# Patient Record
Sex: Male | Born: 1955 | Race: Black or African American | Hispanic: No | Marital: Single | Smoking: Former smoker
Health system: Southern US, Community
[De-identification: ages and names within clinical notes are randomized; demographics above are authoritative.]

## PROBLEM LIST (undated history)

## (undated) DIAGNOSIS — K4090 Unilateral inguinal hernia, without obstruction or gangrene, not specified as recurrent: Secondary | ICD-10-CM

## (undated) DIAGNOSIS — Z95810 Presence of automatic (implantable) cardiac defibrillator: Secondary | ICD-10-CM

## (undated) DIAGNOSIS — I2510 Atherosclerotic heart disease of native coronary artery without angina pectoris: Secondary | ICD-10-CM

## (undated) DIAGNOSIS — J189 Pneumonia, unspecified organism: Secondary | ICD-10-CM

## (undated) DIAGNOSIS — C801 Malignant (primary) neoplasm, unspecified: Secondary | ICD-10-CM

## (undated) DIAGNOSIS — R7303 Prediabetes: Secondary | ICD-10-CM

## (undated) DIAGNOSIS — I2109 ST elevation (STEMI) myocardial infarction involving other coronary artery of anterior wall: Secondary | ICD-10-CM

## (undated) HISTORY — DX: ST elevation (STEMI) myocardial infarction involving other coronary artery of anterior wall: I21.09

## (undated) HISTORY — DX: Atherosclerotic heart disease of native coronary artery without angina pectoris: I25.10

## (undated) HISTORY — DX: Unilateral inguinal hernia, without obstruction or gangrene, not specified as recurrent: K40.90

## (undated) HISTORY — PX: COLONOSCOPY: SHX174

---

## 1999-03-08 ENCOUNTER — Emergency Department (HOSPITAL_COMMUNITY): Admission: EM | Admit: 1999-03-08 | Discharge: 1999-03-08 | Payer: Self-pay

## 2000-11-21 ENCOUNTER — Inpatient Hospital Stay (HOSPITAL_COMMUNITY): Admission: EM | Admit: 2000-11-21 | Discharge: 2000-11-26 | Payer: Self-pay | Admitting: Emergency Medicine

## 2000-11-21 ENCOUNTER — Encounter: Payer: Self-pay | Admitting: Emergency Medicine

## 2000-11-22 ENCOUNTER — Encounter: Payer: Self-pay | Admitting: Emergency Medicine

## 2000-11-23 ENCOUNTER — Encounter: Payer: Self-pay | Admitting: Internal Medicine

## 2000-12-19 ENCOUNTER — Encounter: Payer: Self-pay | Admitting: Family Medicine

## 2000-12-19 ENCOUNTER — Ambulatory Visit (HOSPITAL_COMMUNITY): Admission: RE | Admit: 2000-12-19 | Discharge: 2000-12-19 | Payer: Self-pay | Admitting: Family Medicine

## 2005-11-17 ENCOUNTER — Ambulatory Visit (HOSPITAL_COMMUNITY): Admission: RE | Admit: 2005-11-17 | Discharge: 2005-11-17 | Payer: Self-pay | Admitting: Gastroenterology

## 2010-06-26 DIAGNOSIS — I509 Heart failure, unspecified: Secondary | ICD-10-CM

## 2010-06-26 HISTORY — DX: Heart failure, unspecified: I50.9

## 2010-07-12 ENCOUNTER — Inpatient Hospital Stay (HOSPITAL_COMMUNITY)
Admission: AD | Admit: 2010-07-12 | Discharge: 2010-07-19 | Payer: Self-pay | Source: Home / Self Care | Attending: Cardiovascular Disease | Admitting: Cardiovascular Disease

## 2010-07-12 HISTORY — PX: CARDIAC CATHETERIZATION: SHX172

## 2010-07-13 ENCOUNTER — Encounter (INDEPENDENT_AMBULATORY_CARE_PROVIDER_SITE_OTHER): Payer: Self-pay | Admitting: Cardiovascular Disease

## 2010-07-13 LAB — CBC
HCT: 43 % (ref 39.0–52.0)
Hemoglobin: 15.4 g/dL (ref 13.0–17.0)
MCH: 31.8 pg (ref 26.0–34.0)
MCHC: 35.8 g/dL (ref 30.0–36.0)
MCV: 88.8 fL (ref 78.0–100.0)
Platelets: 193 10*3/uL (ref 150–400)
RBC: 4.84 MIL/uL (ref 4.22–5.81)
RDW: 12.8 % (ref 11.5–15.5)
WBC: 9.5 10*3/uL (ref 4.0–10.5)

## 2010-07-13 LAB — MAGNESIUM: Magnesium: 1.9 mg/dL (ref 1.5–2.5)

## 2010-07-13 LAB — COMPREHENSIVE METABOLIC PANEL
ALT: 79 U/L — ABNORMAL HIGH (ref 0–53)
AST: 500 U/L — ABNORMAL HIGH (ref 0–37)
Albumin: 3.6 g/dL (ref 3.5–5.2)
Alkaline Phosphatase: 84 U/L (ref 39–117)
BUN: 5 mg/dL — ABNORMAL LOW (ref 6–23)
CO2: 22 mEq/L (ref 19–32)
Calcium: 8.3 mg/dL — ABNORMAL LOW (ref 8.4–10.5)
Chloride: 106 mEq/L (ref 96–112)
Creatinine, Ser: 0.87 mg/dL (ref 0.4–1.5)
GFR calc Af Amer: >60 mL/min (ref >60)
GFR calc non Af Amer: >60 mL/min (ref >60)
Glucose, Bld: 126 mg/dL — ABNORMAL HIGH (ref 70–99)
Potassium: 4 mEq/L (ref 3.5–5.1)
Sodium: 134 mEq/L — ABNORMAL LOW (ref 135–145)
Total Bilirubin: 0.5 mg/dL (ref 0.3–1.2)
Total Protein: 6.3 g/dL (ref 6.0–8.3)

## 2010-07-13 LAB — DIFFERENTIAL
Basophils Absolute: 0 10*3/uL (ref 0.0–0.1)
Basophils Relative: 0 % (ref 0–1)
Eosinophils Absolute: 0 10*3/uL (ref 0.0–0.7)
Eosinophils Relative: 0 % (ref 0–5)
Lymphocytes Relative: 13 % (ref 12–46)
Lymphs Abs: 1.2 10*3/uL (ref 0.7–4.0)
Monocytes Absolute: 0.7 10*3/uL (ref 0.1–1.0)
Monocytes Relative: 7 % (ref 3–12)
Neutro Abs: 7.5 10*3/uL (ref 1.7–7.7)
Neutrophils Relative %: 80 % — ABNORMAL HIGH (ref 43–77)

## 2010-07-13 LAB — POCT I-STAT, CHEM 8
BUN: 6 mg/dL (ref 6–23)
Calcium, Ion: 1.1 mmol/L — ABNORMAL LOW (ref 1.12–1.32)
Chloride: 106 mEq/L (ref 96–112)
Creatinine, Ser: 0.9 mg/dL (ref 0.4–1.5)
Glucose, Bld: 154 mg/dL — ABNORMAL HIGH (ref 70–99)
HCT: 48 % (ref 39.0–52.0)
Hemoglobin: 16.3 g/dL (ref 13.0–17.0)
Potassium: 3.6 mEq/L (ref 3.5–5.1)
Sodium: 140 mEq/L (ref 135–145)
TCO2: 22 mmol/L (ref 0–100)

## 2010-07-13 LAB — CARDIAC PANEL(CRET KIN+CKTOT+MB+TROPI)
CK, MB: 626.1 ng/mL (ref 0.3–4.0)
Relative Index: 8.3 — ABNORMAL HIGH (ref 0.0–2.5)
Total CK: 7523 U/L — ABNORMAL HIGH (ref 7–232)
Troponin I: >100 ng/mL (ref 0.00–0.06)

## 2010-07-13 LAB — APTT: aPTT: 74 seconds — ABNORMAL HIGH (ref 24–37)

## 2010-07-13 LAB — PROTIME-INR
INR: 2.12 — ABNORMAL HIGH (ref 0.00–1.49)
Prothrombin Time: 23.9 seconds — ABNORMAL HIGH (ref 11.6–15.2)

## 2010-07-13 LAB — MRSA PCR SCREENING: MRSA by PCR: NEGATIVE

## 2010-07-13 LAB — TSH: TSH: 1.553 u[IU]/mL (ref 0.350–4.500)

## 2010-07-14 HISTORY — PX: PACEMAKER PLACEMENT: SHX43

## 2010-07-18 LAB — URINALYSIS, MICROSCOPIC ONLY
Bilirubin Urine: NEGATIVE
Hgb urine dipstick: NEGATIVE
Ketones, ur: NEGATIVE mg/dL
Leukocytes, UA: NEGATIVE
Nitrite: NEGATIVE
Protein, ur: 30 mg/dL — AB
Specific Gravity, Urine: >1.046 — ABNORMAL HIGH (ref 1.005–1.030)
Urine Glucose, Fasting: NEGATIVE mg/dL
Urobilinogen, UA: 0.2 mg/dL (ref 0.0–1.0)
pH: 6 (ref 5.0–8.0)

## 2010-07-18 LAB — COMPREHENSIVE METABOLIC PANEL
ALT: 78 U/L — ABNORMAL HIGH (ref 0–53)
AST: 305 U/L — ABNORMAL HIGH (ref 0–37)
Albumin: 3.4 g/dL — ABNORMAL LOW (ref 3.5–5.2)
Chloride: 107 mEq/L (ref 96–112)
Creatinine, Ser: 1.62 mg/dL — ABNORMAL HIGH (ref 0.4–1.5)
GFR calc Af Amer: 54 mL/min — ABNORMAL LOW (ref >60)
Sodium: 140 mEq/L (ref 135–145)
Total Bilirubin: 1 mg/dL (ref 0.3–1.2)

## 2010-07-18 LAB — CBC
HCT: 41 % (ref 39.0–52.0)
Hemoglobin: 14.2 g/dL (ref 13.0–17.0)
Hemoglobin: 15.9 g/dL (ref 13.0–17.0)
Hemoglobin: 14.9 g/dL (ref 13.0–17.0)
MCH: 30.8 pg (ref 26.0–34.0)
MCH: 31.6 pg (ref 26.0–34.0)
MCHC: 34.6 g/dL (ref 30.0–36.0)
MCV: 88.9 fL (ref 78.0–100.0)
Platelets: 171 10*3/uL (ref 150–400)
RBC: 4.61 MIL/uL (ref 4.22–5.81)
RBC: 5.03 MIL/uL (ref 4.22–5.81)
RBC: 4.73 MIL/uL (ref 4.22–5.81)
RDW: 12.9 % (ref 11.5–15.5)
WBC: 10.9 10*3/uL — ABNORMAL HIGH (ref 4.0–10.5)

## 2010-07-18 LAB — CARDIAC PANEL(CRET KIN+CKTOT+MB+TROPI)
CK, MB: 760 ng/mL (ref 0.3–4.0)
CK, MB: 323.1 ng/mL (ref 0.3–4.0)
CK, MB: 41.6 ng/mL (ref 0.3–4.0)
Relative Index: 9.5 — ABNORMAL HIGH (ref 0.0–2.5)
Relative Index: 6.8 — ABNORMAL HIGH (ref 0.0–2.5)
Relative Index: 2.4 (ref 0.0–2.5)
Total CK: 8012 U/L — ABNORMAL HIGH (ref 7–232)
Total CK: 4729 U/L — ABNORMAL HIGH (ref 7–232)
Troponin I: >100 ng/mL (ref 0.00–0.06)
Troponin I: >100 ng/mL (ref 0.00–0.06)

## 2010-07-18 LAB — BASIC METABOLIC PANEL
BUN: 8 mg/dL (ref 6–23)
CO2: 23 mEq/L (ref 19–32)
Calcium: 8.7 mg/dL (ref 8.4–10.5)
Chloride: 104 mEq/L (ref 96–112)
Creatinine, Ser: 1.14 mg/dL (ref 0.4–1.5)
GFR calc Af Amer: >60 mL/min (ref >60)
GFR calc non Af Amer: >60 mL/min (ref >60)
Glucose, Bld: 149 mg/dL — ABNORMAL HIGH (ref 70–99)
Potassium: 4.1 mEq/L (ref 3.5–5.1)
Sodium: 137 mEq/L (ref 135–145)

## 2010-07-18 LAB — HEPARIN LEVEL (UNFRACTIONATED)
Heparin Unfractionated: 0.12 IU/mL — ABNORMAL LOW (ref 0.30–0.70)
Heparin Unfractionated: 0.49 IU/mL (ref 0.30–0.70)
Heparin Unfractionated: 0.66 IU/mL (ref 0.30–0.70)
Heparin Unfractionated: 0.18 IU/mL — ABNORMAL LOW (ref 0.30–0.70)
Heparin Unfractionated: 0.35 IU/mL (ref 0.30–0.70)

## 2010-07-18 LAB — POCT I-STAT 3, ART BLOOD GAS (G3+)
Acid-base deficit: 5 mmol/L — ABNORMAL HIGH (ref 0.0–2.0)
Bicarbonate: 16.9 mEq/L — ABNORMAL LOW (ref 20.0–24.0)
O2 Saturation: 89 %
Patient temperature: 98.6
TCO2: 18 mmol/L (ref 0–100)
pCO2 arterial: 24.8 mmHg — ABNORMAL LOW (ref 35.0–45.0)
pH, Arterial: 7.442 (ref 7.350–7.450)
pO2, Arterial: 53 mmHg — ABNORMAL LOW (ref 80.0–100.0)

## 2010-07-18 LAB — BRAIN NATRIURETIC PEPTIDE
Pro B Natriuretic peptide (BNP): 317 pg/mL — ABNORMAL HIGH (ref 0.0–100.0)
Pro B Natriuretic peptide (BNP): 901 pg/mL — ABNORMAL HIGH (ref 0.0–100.0)

## 2010-07-18 LAB — HEMOGLOBIN A1C
Hgb A1c MFr Bld: 5.7 % — ABNORMAL HIGH (ref <5.7)
Hgb A1c MFr Bld: 5.7 % — ABNORMAL HIGH (ref <5.7)
Mean Plasma Glucose: 117 mg/dL — ABNORMAL HIGH (ref <117)
Mean Plasma Glucose: 117 mg/dL — ABNORMAL HIGH (ref <117)

## 2010-07-18 LAB — LIPID PANEL
Cholesterol: 133 mg/dL (ref 0–200)
HDL: 39 mg/dL — ABNORMAL LOW (ref >39)
LDL Cholesterol: 80 mg/dL (ref 0–99)
Total CHOL/HDL Ratio: 3.4 RATIO
Triglycerides: 70 mg/dL (ref <150)
VLDL: 14 mg/dL (ref 0–40)

## 2010-07-19 LAB — BASIC METABOLIC PANEL
CO2: 21 mEq/L (ref 19–32)
CO2: 22 mEq/L (ref 19–32)
Calcium: 8.2 mg/dL — ABNORMAL LOW (ref 8.4–10.5)
Calcium: 8.2 mg/dL — ABNORMAL LOW (ref 8.4–10.5)
Chloride: 108 mEq/L (ref 96–112)
GFR calc Af Amer: >60 mL/min (ref >60)
GFR calc Af Amer: >60 mL/min (ref >60)
GFR calc non Af Amer: >60 mL/min (ref >60)
Sodium: 137 mEq/L (ref 135–145)
Sodium: 136 mEq/L (ref 135–145)

## 2010-07-19 LAB — CBC
Hemoglobin: 13.9 g/dL (ref 13.0–17.0)
Hemoglobin: 13.5 g/dL (ref 13.0–17.0)
MCH: 31.5 pg (ref 26.0–34.0)
MCHC: 35.1 g/dL (ref 30.0–36.0)
Platelets: 189 10*3/uL (ref 150–400)
Platelets: 209 10*3/uL (ref 150–400)
RBC: 4.45 MIL/uL (ref 4.22–5.81)
RBC: 4.51 MIL/uL (ref 4.22–5.81)
WBC: 13.3 10*3/uL — ABNORMAL HIGH (ref 4.0–10.5)

## 2010-07-19 LAB — HEPARIN LEVEL (UNFRACTIONATED)
Heparin Unfractionated: 0.23 IU/mL — ABNORMAL LOW (ref 0.30–0.70)
Heparin Unfractionated: 0.56 IU/mL (ref 0.30–0.70)

## 2010-07-19 LAB — BRAIN NATRIURETIC PEPTIDE: Pro B Natriuretic peptide (BNP): 253 pg/mL — ABNORMAL HIGH (ref 0.0–100.0)

## 2010-07-20 LAB — COMPREHENSIVE METABOLIC PANEL
ALT: 47 U/L (ref 0–53)
AST: 45 U/L — ABNORMAL HIGH (ref 0–37)
Albumin: 2.7 g/dL — ABNORMAL LOW (ref 3.5–5.2)
Alkaline Phosphatase: 67 U/L (ref 39–117)
Chloride: 107 mEq/L (ref 96–112)
GFR calc Af Amer: >60 mL/min (ref >60)
Potassium: 3.8 mEq/L (ref 3.5–5.1)
Sodium: 136 mEq/L (ref 135–145)
Total Bilirubin: 0.7 mg/dL (ref 0.3–1.2)

## 2010-07-20 LAB — CBC
Hemoglobin: 13.3 g/dL (ref 13.0–17.0)
MCH: 31 pg (ref 26.0–34.0)
RBC: 4.29 MIL/uL (ref 4.22–5.81)
WBC: 9.6 10*3/uL (ref 4.0–10.5)

## 2010-07-20 LAB — HEPARIN LEVEL (UNFRACTIONATED): Heparin Unfractionated: 0.63 IU/mL (ref 0.30–0.70)

## 2010-07-21 NOTE — Procedures (Addendum)
  NAMEKOLSEN, CHOE NO.:  [id number]  MEDICAL RECORD NO.:  [id number]          PATIENT TYPE:  INP  LOCATION:  2902                         FACILITY:  [facility]  PHYSICIAN:  Thereasa Solo. Little, M.D. DATE OF BIRTH:  02/04/1957  DATE OF PROCEDURE:  07/14/2010 DATE OF DISCHARGE:                           CARDIAC CATHETERIZATION   INDICATIONS FOR TEST:  This 55 year old male was admitted on July 12, 2010, having a massive anterior myocardial infarction with troponins greater than 100 upon arrival and they have maintained themselves greater than 100 now 48 hours later.  Late yesterday afternoon, he began having some intermittent second-degree block.  This has progressed to intermittent second and third-degree AV block and long pauses with nonconducting P-waves.  Because of this, he is brought to the Cath Lab for temporary pacemaker implantation.  Following informed consent, the patient was prepped and draped in the usual sterile fashion exposing the right groin. Following local anesthetic with 1% Xylocaine, the Seldinger technique employed.  A 6- French introducer sheath was placed in the right femoral vein.  A 5- [place] temporary pacemaker was then placed through the sheath into the apex of the right ventricle.  This pacemaker wire was defective and we could not get it to even transmit any evidence of pacer spike.  Because of this, after changing the box and changing the external wires, the pacer wire was exchanged out for a second 5-French pacer wire.  This also was placed in the apex of the right ventricle, showed appropriate function and there was good capture of the rhythm all way down to MA of 0.6.  This pacemaker settings are currently MA of 5 and rate of 60.  This device was sewn in place and the patient will be watched for the next 48 hours, but if this rhythm issue continues he will need a permanent device.  His ejection fraction was 35-40% at the time  of his acute event.          ______________________________ Thereasa Solo. Little, M.D.     ABL/MEDQ  D:  07/14/2010  T:  07/14/2010  Job:  [id number]  cc:   Nicki Guadalajara, M.D.  Electronically Signed by Julieanne Manson M.D. on 07/21/2010 04:01:34 PM

## 2010-07-27 NOTE — Procedures (Signed)
Steven [name], Steven [name] NO.:  [id number]  MEDICAL RECORD NO.:  [id number]          PATIENT TYPE:  INP  LOCATION:  2902                         FACILITY:  [facility]  PHYSICIAN:  Nicki Guadalajara, M.D.     DATE OF BIRTH:  02/04/1957  DATE OF PROCEDURE:  07/12/2010 DATE OF DISCHARGE:                           CARDIAC CATHETERIZATION   PROCEDURE:  Emergent cardiac catheterization and percutaneous coronary intervention.  INDICATIONS:  [name] is a 55 year old African American gentleman originally from [place].  He denies any prior known cardiac history. Today sometime between 9 and 10 a.m., he developed new onset chest pressure.  His chest pain persisted for at least 2 hours.  Ultimately, EMS was contacted.  At the site, ECG was taken which confirmed an acute anterior wall ST-segment elevation myocardial infarction.  A code STEMI was called in the field and the patient was transported from his home via EMS to [facility] and transported directly to the cardiac catheterization laboratory per code STEMI protocol.  PROCEDURE IN DETAIL:  Upon arrival to the Catheterization Laboratory, the patient was still having chest pain.  His ECG showed 4-5 mm of acute anterior wall ST-segment elevation, V1 through V5.  Right femoral artery was punctured anteriorly, and a 6-French sheath was inserted. Diagnostic catheterization was done utilizing 6-French FL-4 and FR-4 diagnostic catheters.  A 6-French XB LAD 4.0 diagnostic catheter was used for the intervention.  The patient was given Effient 60 mg as well as bivalirudin.  An I-sat was also obtained.  He did receive IV Versed plus fentanyl for sedation.  ACT was documented to be therapeutic.  An Asahi medium wire was able to be advanced into the LAD and cross the total occlusion.  Dilatation was done with a 2.5 x 15-mm apex balloon. Due to concern over compliance issues with long lesion length of large vessel size, decision  was made to stent with a non DES stent.  A 3.5 x 24-mm bare-metal Veriflex stent was then inserted and dilated x2 to 13 atmospheres.  A 4.0 x 15-mm noncompliant tract was used for post-stent dilatation with dilatation up to approximately 4.0 mm.  Initially, there was significant resistance in opening up the vessel, but ultimately the entire area was opened to 4.0 mm.  Scout angiography confirmed an excellent angiographic result with resumption of brisk TIMI 3 flow and 100% occlusion being reduced to 0%.  At this point, the patient was painfree.  A 6-French pigtail catheter was then inserted for left ventriculography.  The catheter was sutured in place.  During the procedure, the patient was titrated up to 50 mcg of intravenous nitroglycerin and received several doses of 200 mcg of intracoronary nitroglycerin.  HEMODYNAMIC DATA:  Central aortic pressure was initially 164/110.  On pullback, LV pressure was 140/16, post A-wave 28, AO pressure 140/96 with the patient on IV nitroglycerin.  At that time at approximately 40 mcg.  ANGIOGRAPHIC DATA:  Left main coronary artery was angiographically normal and bifurcated into the LAD and left circumflex system.  There was evidence for mild coronary calcification in the LAD proximally.  The LAD gave rise to a first diagonal vessel and after this, diagonal vessel was narrowed about 50% before giving rise to the second diagonal vessel proximally, which also had 90% mid stenosis.  The LAD was then completely occluded after the second diagonal vessel. There was TIMI zero flow down the remainder of the LAD.  The circumflex vessel was moderate-sized vessel that gave rise to two marginal branches.  There was 70-80% focal narrowing in the proximal portion of the first marginal vessel.  The right coronary artery had a superior takeoff.  There was 40% narrowing in the midportion of the vessel.  There did appear to be diffuse 60-70% narrowing in the PDA.   There was extensive calcification in what appeared to be the pericardium well outside the RCA distribution, the lateral border of the heart border.  Following percutaneous coronary intervention to the LAD with PTCA, stenting with a 3.5 x 24-mm bare-metal Veriflex stent, with post-stent dilatation up to 4.0 mm, the entire proximal region was reduced to 0%. There was brisk TIMI 3 flow with no evidence for dissection.  Reperfusion time from the patient arrival was 24 minutes, door to balloon time 25 minutes.  RAO ventriculography demonstrated moderate acute LV dysfunction with significant hypo to akinesis involving the anterolateral wall with akinesis to dyskinesis apically with an acute ejection fraction of approximately 35-40%.  IMPRESSION: 1. Acute ST-segment elevation anterior wall myocardial infarction     secondary to total proximal occlusion of the left anterior     descending coronary artery with initial TIMI zero flow. 2. 70-80% narrowing in the proximal portion of the obtuse marginal 1     vessel. 3. 40% narrowing in the mid right coronary artery with 60-70%     narrowing in the posterior descending coronary artery.4. Probable diffuse calcification of the outer border of the     pericardium. 5. Acute left ventricular dysfunction with initial ejection fraction     of 35-40% with extensive anterolateral apical hypo to akinesis with     suggestion of apical akinesis to dyskinesis. 6. Successful percutaneous coronary intervention to the left anterior     descending coronary artery with 100% occlusion being reduced to 0%     and TIMI zero flow being improved to TIMI 3 flow with ultimate     insertion of a 3.5 x 24-mm bare-metal Veriflex stent postdilated to     4.0 mm. 7. Bivalirudin/60 mg of Effient/IC and IV nitroglycerin. 8. Probable calcified pericardium. 9. Reperfusion time 24 minutes; total balloon time 25 minutes from the     patient arrival to [facility];  however, from chest pain     onset to arrival was most likely greater than 2 hours.          ______________________________ Nicki Guadalajara, M.D.     TK/MEDQ  D:  07/12/2010  T:  07/13/2010  Job:  [id number]  Electronically Signed by Nicki Guadalajara M.D. on 07/27/2010 02:38:58 PM

## 2010-07-27 NOTE — Discharge Summary (Signed)
NAMEJABEN, [name] NO.:  [id number]  MEDICAL RECORD NO.:  [id number]          PATIENT TYPE:  INP  LOCATION:  2902                         FACILITY:  [facility]  PHYSICIAN:  Steven [name], M.D.     DATE OF BIRTH:  02/04/1957  DATE OF ADMISSION:  07/12/2010 DATE OF DISCHARGE:  07/19/2010                              DISCHARGE SUMMARY   DISCHARGE DIAGNOSES: 1. ST-elevation myocardial infarction, anterior wall, ejection     fraction of 30%. 2. Nonsustained ventricular tachycardia. 3. Third-degree atrioventricular block, resolved. 4. Pulmonary edema. 5. Tobacco abuse. 6. Hypertension. 7. Hypokalemia, repleted.  HOSPITAL COURSE:  Steven [name] is a 55 year old African American gentleman from [place].  He has no prior cardiac history.  He developed a new-onset chest pressure that persisted for at least 2 hours.  He was ultimately seen by EMS.  On-site EKG confirmed an acute anterior wall ST- elevation myocardial infarction.  Code STEMI was called in the field. The patient was taken directly to the Cath Lab.  There was total proximal occlusion of the left anterior descending coronary artery with an initial TIMI 0 flow.  Also 78% narrowing in the proximal portion of the obtuse marginal 1 vessel.  40% narrowing in the right and the mid right coronary artery with 60-70% narrowing in the posterior descending coronary artery.  Left ventricular ejection fraction was noted at 35-40% with extensive anterolateral apical hypo to akinesis with suggestion of apical akinesis to dyskinesis.  Left anterior descending artery was stented with a bare-metal Veriflex stent.  On July 13, 2010, the patient had episode of acute respiratory failure secondary to pulmonary edema and fluid overload, started on Bumex 2 g IV x1.  BNP was ordered as well as stat ABG.  On July 14, 2010, the patient denied any chest pain or shortness of breath. Acute respiratory concerns apparently  resolved after receiving Bumex.  Rhythm appeared to be a combination of first, second, and third degree heart block.  The patient was set up for the temporary pacer.  Pacer device was placed on July 14, 2010.  On July 15, 2010, the patient had no further heart block.  His Coreg was started and blood pressure was __________.  On July 16, 2010, the patient had no complaints, he had heart rate in the 80s, 0 pacing, and 0 complete heart block.  His temporary pacer was pulled at this time.  The patient was sent for contrast echo, which revealed estimated ejection fraction of 30-35% with mild LVH and concentric hypertrophy. There was akinesis of the anteroseptal myocardium consistent with infarction in the distribution of left anterior descending coronary artery.  Acoustic contrast opacification revealed no evidence of thrombus.  Peak PA pressures were 36 mmHg.  On July 18, 2010, the patient was doing very well, ambulating without difficulty.  He was transferred to Step-Down Unit.  Tobacco cessation counseling was completed.  At this time, the patient has been seen by Dr. Rennis Golden, feels stable for discharge after being fitted and given instructions for Zoll LifeVest to be worn for approximately 90 days, at  which time, a  new transthoracic cardiogram will be completed to assess ejection fraction and LV function.  DISCHARGE LABORATORY DATA:  WBCs 9.6, hemoglobin 13.3, hematocrit 38.1, platelets 232.  Sodium 136, potassium 3.8, chloride 107, carbon dioxide 21, glucose 104, BUN 15, creatinine 1.12.  Total bilirubin 0.7, alkaline phosphatase 67, AST 45, ALT 47, total protein 6.2, albumin 2.7, calcium 8.7, magnesium 2.1.  BNP 100.0.  STUDIES/PROCEDURES: 1. Chest x-ray on July 18, 2010.  Findings:  Diffuse airspace     disease seen bilaterally on previously study has essentially     resolved.  Cardiopericardial silhouette appears smaller.     Curvilinear calcification on the right  heart border is presumedly     pericardial.  Image of the bony structures of the thorax are     intact.  Impression:  Interval resolution of bilateral airspace     opacities. 2. Echocardiogram on July 18, 2010:  LV function was mildly to     severely reduced, wall thickness was increased in the pattern of     mild LVH.  There was concentric hypertrophy.  Estimated ejection     fraction was in the range of 30-35%.  There was akinesis of the     anterior septal myocardium consistent with infarction in the     distribution of the left anterior descending coronary artery.     Acoustic contrast opacification revealed no evidence of thrombus.     Pulmonary arteries, peak PA pressure 36 mmHg. 3. Cardiac catheterization on July 12, 2010:  Impression:  Acute ST-     elevation anterior wall myocardial infarction secondary to total     proximal occlusion of left anterior descending coronary artery with     initial TIMI 0 flow.  70-80% narrowing in the proximal portion of     the obtuse marginal 1 vessel.  40% narrowing in the mid right     coronary artery with 60-70% narrowing in the posterior descending     coronary artery.  Probable diffuse calcification of the outer     border of the pericardium.  Acute left ventricular dysfunction with     an initial ejection fraction of 35-40% with extensive anterolateral     apical hypo to akinesis with a suggestion of apical akinesis to     dyskinesis.  Successful percutaneous coronary intervention to the     left anterior descending coronary artery with 100% occlusion being     reduced to 0, TIMI 0 flow being improved TIMI 3 flow with ultimate     insertion of a 3.5 x 24-mm bare-metal Veriflex stent postdilated to     4.0 mm.  Reperfusion time 24 minutes, total balloon time 25 minutes     from the patient's arrival at [facility].  However from     chest pain onset to arrival was most likely greater than 2 hours. 4. Cardiac catheterization on  July 14, 2010 shows temporary     pacemaker implantation.  DISCHARGE MEDICATIONS: 1. Aspirin 325 mg enteric-coated 1 tablet by mouth daily. 2. Bumex 1 mg 1 tablet by mouth daily. 3. Carvedilol 3.125 mg 1 tablet by mouth twice daily with meals. 4. Imdur 30 mg 1 tablet by mouth daily. 5. Lisinopril 10 mg 1 tablet by mouth daily. 6. Nitroglycerin sublingual 0.4 mg tablet, 1 tablet under the tongue     every 5 minutes for chest pain, maximum of 3 doses. 7. Pantoprazole 40 mg enteric-coated 1 tablet by mouth daily. 8. Prasugrel 10 mg  1 tablet by mouth daily. 9. Rosuvastatin 20 mg 1 tablet by mouth daily. 10.Spirolactone 25 mg tablets 1/2 tablet by mouth twice daily. 11.Acetaminophen over-the-counter 325 mg 2 tablets by mouth every 4     hours as needed for headache or mild pain. 12.Multivitamin over-the-counter 1 tablet by mouth daily.  DISPOSITION:  Steven [name] will be discharged home in stable condition.  He is to increase his activity slowly, may shower and bathe.  No lifting for 2 weeks.  No driving for 2 weeks.  He will return to work on date as discussed with MD after his followup appointment, which should be in approximate 1 week with Dr. Tresa Endo.  He is to eat a low-sodium, heart- healthy diet.  He is quit smoking.  He is to wear his Zoll LifeVest at all times except while in the shower.  After drying off, he is to immediately put it back on.  He will be able to be discharged today after complete instructions given on Zoll LifeVest.  Zoll LifeVest will be worn for approximately 90 days at which time, he will have another repeat transthoracic echocardiogram to assess his LV function and ejection fraction.    ______________________________ Wilburt Finlay, PA   ______________________________ Steven [name], M.D.    BH/MEDQ  D:  07/19/2010  T:  07/20/2010  Job:  [id number]  Electronically Signed by Wilburt Finlay PA on 07/25/2010 02:43:14 PM Electronically Signed by Steven [name] M.D.  on 07/27/2010 02:39:00 PM

## 2010-11-11 NOTE — H&P (Signed)
[facility]  Patient:    Steven [name], Steven [name]                           MRN: [id number] Adm. Date:  [id number] Disc. Date: [id number] Attending:  Jetty Duhamel T                         History and Physical  HISTORY OF PRESENT ILLNESS:  This 55 year old black male presented with intermittent midepigastric pain for about a week and a half.  He had some intermittent nausea but no vomiting.  He had no change in his bowels.  He denied any fever or chills.  He had been taking Alka-Seltzer and Milk of Magnesia without relief.  He denied any aggravating factors.  He thought he might have gotten slight transient relief from food.  On the day of admission, the pain worsened and he presented to the emergency room.  Evaluation there showed an elevated amylase and lipase.  His pain was difficult to control in the emergency room and he was admitted for further evaluation and treatment.  PAST MEDICAL HISTORY:  Hospitalizations:  None.  MEDICATIONS:  Claritin.  ALLERGIES:  None.  FAMILY HISTORY:  Mother died of unknown causes.  Father, two brothers, and one sister are living but the state of their health is unknown, as they live in [place].  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  He has had a lot of stress recently secondary to alcohol violations against two of his employees.  He is single.  He smokes cigars.  He drinks two six-packs of beer per week and possibly a half pint of wine per week.  REVIEW OF SYSTEMS:  Unremarkable except as above.  PHYSICAL EXAMINATION:  VITAL SIGNS:  Blood pressure 130/80.  The patient was too drowsy from analgesics to check orthostatic blood pressures.  Pulse is 90.  HEENT:  PERRLA, EOMs intact.  Fundi benign.  TMs and pharynx clear.  NECK:  No adenopathy or thyromegaly.  Carotids are without bruits.  LUNGS:  Clear to P&A.  He has decreased breath sounds on the left base.  There are no rales or  rhonchi.  HEART:  Regular rhythm with no murmurs or ectopy.  ABDOMEN:  Soft, nondistended with mild to moderate upper mid-abdominal tenderness.  Bowel sounds are present.  There are no masses.  There is no rebound.  There is a little bit of guarding.  RECTAL:  No masses.  Stool is brown and Hemoccult negative.  EXTREMITIES:  No edema.  He has good peripheral pulses.  NEUROLOGIC:  Cranial nerves are intact.  He moves all extremities.  LABORATORY DATA:  Chest x-ray reveals left basilar subsegmental atelectasis. Abdominal films reveal normal gas pattern with no obstruction.  Amylase is 271.  Lipase 388.  Comprehensive metabolic profile is normal except blood sugar is 143 (IV was running).  White count was 9700 with 86% neutrophils, hemoglobin 17.2, hematocrit 49.5.  DIAGNOSIS:  Acute pancreatitis of uncertain etiology; rule out secondary to alcohol, rule out secondary to stones, rule out secondary to ulcer disease with posterior perforation.  PLAN:  We will admit for IV fluids and analgesics.  He is scheduled for gallbladder ultrasound.  Consider upper endoscopy.  Begin IV H2 blockers.  We will not place an NG tube now, as the patient is not vomiting.  DD:  11/29/00 TD:  11/30/00 Job: [id number]

## 2010-11-11 NOTE — H&P (Signed)
[facility]  Patient:    Steven [name], Steven [name]                           MRN: [id number] Adm. Date:  11/21/00 Attending:  Chales Salmon. Abigail Miyamoto, M.D.                         History and Physical  HISTORY:  This 55 year old white male reports that he has had intermittent midepigastric abdominal pain for the last week and a half.  He has had some intermittent nausea but denies any vomiting.  He has had no change in his bowels, denies any fever or chills.  He denies any aggravating factors. DD:  11/22/00 TD:  11/22/00 Job: [id number]

## 2010-11-11 NOTE — Discharge Summary (Signed)
[facility]  Patient:    Steven [name], Steven [name]                           MRN: [id number] Adm. Date:  [id number] Disc. Date: [id number] Attending:  Jetty Duhamel T CC:         Vikki Ports, M.D.   Discharge Summary  DATE OF BIRTH:  February 04, 1957  PRIMARY CARE PHYSICIAN:  Dr. Lanell Persons.  DISCHARGE DIAGNOSES: 1. Idiopathic pancreatitis - alcohol abuse versus true idiopathic. 2. Abdominal fluid collections - no evidence of true abscess by CT scan -    requiring follow-up radiologic evaluation.  DISCHARGE MEDICATIONS: 1. Augmentin 875 mg one b.i.d. x 11 days then stop. 2. Pepcid 20 mg one b.i.d. x 30 dyspnea then stop. 3. Tylox 500/5 one tablet q.6h. p.r.n. pain - not to exceed more than eight    tablets per day.  PROCEDURES: 1. CT scan of the abdomen and pelvic on Nov 23, 2000 - small left pleural    effusion, bibasilar atelectasis.  Liver, spleen, adrenal glands, kidneys    are unremarkable.  Moderate amount of ascites noted.  Inflammation of the    mesentery is noted.  Fluid collection on the left lateral abdomen has mild    rim enhancement and measures 10.5 x 15 cm.  This may represent a developing    pseudocyst, but infection is not entirely excluded.  Mildly enlarged    mesenteric lymph nodes which are nonspecific.  A 6.5 x 4 cm rim enhancing    fluid collection just anterior to the rectum which is nonspecific.  Mild    prostatic enlargement. 2. Ultrasound of the abdomen, Nov 22, 2000 - echogenic kidneys bilaterally    compatible with medical renal disease.  Small amount of free fluid in the    abdomen and pelvis.  Visualized pancreas unremarkable, but the entire gland    is not imaged due to overlying bowel gas.  CONSULTATIONS:  None.  FOLLOWUP:  The patient is instructed to call Dr. Clarnce Flock office for a recheck in approximately one week.  HISTORY OF PRESENT ILLNESS:  [name] is a 55 year old gentleman with no significant  previous history who presented to the hospital for admission on Nov 21, 2000, with complaints of midepigastric pain lasting one and a half weeks.  This had been associated with intermittent nausea, but no vomiting. There had been no change in bowel movements, no fevers or chills.  The patient had been using Alka-Seltzer and Milk of Magnesia at home, but had no relief. When pain significantly increased, he presented to the hospital for admission.  HOSPITAL COURSE: #1 - Pancreatitis:  At time of admission, evaluation included acute abdominal series which was unremarkable.  Amylase and lipase were obtained with lipase markedly elevated at 383 and amylase likewise elevated at 271.  This was consistent with a diagnosis of pancreatitis which also fit the pattern of the patients symptoms.  The patient was admitted to the hospital and initially received IV hydration with IV fluid.  Analgesia was obtained with IV Stadol which did control the patients symptoms well.  He was given empiric Pepcid for GI stress, and workup was accomplished to attempt to recognize the etiology of the patients pancreatitis.  Ultrasound of the abdomen was obtained and was unremarkable for evidence of gallstones.  Fasting lipid panel was obtained, and there was no evidence of hypercholesterolemia or hypertriglyceridemia to explain the patients pancreatitis.  Discussion at length with the patient did reveal consumption of approximately two six-packs of beer per week with occasional one on top of that.  The patient adamantly denied that he had a problem with alcohol use, stating that he had no history of DUIs or arrests related to alcohol.  He stated that he did not drink to drunkenness.  No other etiology of the patients pancreatitis could be elucidated.  The predominant theory was that this was likely related to the patients alcohol intake.  The patient was advised to discontinue use of alcohol entirely and voiced  understanding of this.  He was advised that should he continue to drink alcohol at any extent that it likely could lead to further episodes of pancreatitis and permanent damage of the pancreas if not liver damage as well.  At time of discharge, the patients pain was much better controlled, although not completely resolved, and therefore, he was given Tylox for outside pain management.  He was advised on a low-fat diet and again counseled to discontinue alcohol use in its entirety.  He was tolerating a regular diet and not requiring IV hydration.  The patient was counseled that should he develop a severe increase in pain, intractable nausea or vomiting, or high fever that he should call his primary care physician for immediate follow-up or report to the emergency room.  #2 - INTRA-ABDOMINAL FLUID COLLECTION:  On second day of hospitalization, the decision was made to obtain a CT scan of the patients abdomen because of leukocytosis as well as a low-grade fever.  This scan was obtained on Nov 23, 2000, with results as noted above.  After discussion with the radiologist, it was determined that these might represent early abscess formation, but this was not clear.  Decision was made to treat empirically with IV Unasyn and monitor.  IV Unasyn was begun and tolerated without difficulty.  The patients white blood cell count began to trend down significantly, and the patient did not develop a true fever on Unasyn.  The day prior to his discharge, the Unasyn was discontinued, and he was placed on oral Augmentin and tolerated this medication without difficulty.  On day of discharge, white blood cell count had decreased further, and the patient was felt to be stable.  The patient was warned that these fluid collections likely were a result of his pancreatitis and did have the potential to progress to abscess formation.  It was felt that antibiotics were started early enough to likely diminish if  not alleviate the chance of this occurring.  The patient was, however, warned that  should he develop high fevers or severe nausea/vomiting or recurrent severe abdominal pain that this could be indicative of recurrent abdominal infection, and that he should report to Dr. Theresia Lo immediately or report to the emergency room for further evaluation.  The importance of completing his full course of Augmentin was encouraged to the patient, and he voiced understanding.  The patient is being discharged on Augmentin to complete a full 14-day course.  It is recommended that he obtain a follow-up CT scan in approximately two weeks status post discharge to assure these fluid collections have resolved and that there is not evidence of abscess formation. This will be reported to Dr. Clarnce Flock office.  CONDITION AT DISCHARGE:  Much improved. DD:  11/26/00 TD:  11/27/00 Job: [id number]

## 2010-11-23 ENCOUNTER — Emergency Department (HOSPITAL_COMMUNITY): Payer: Self-pay

## 2010-11-23 ENCOUNTER — Inpatient Hospital Stay (HOSPITAL_COMMUNITY): Payer: Self-pay

## 2010-11-23 ENCOUNTER — Inpatient Hospital Stay (HOSPITAL_COMMUNITY)
Admission: EM | Admit: 2010-11-23 | Discharge: 2010-11-28 | DRG: 250 | Disposition: A | Discharge status: Home / Self Care | Payer: Self-pay | Attending: Cardiology | Admitting: Cardiology

## 2010-11-23 DIAGNOSIS — I509 Heart failure, unspecified: Secondary | ICD-10-CM | POA: Diagnosis present

## 2010-11-23 DIAGNOSIS — I2589 Other forms of chronic ischemic heart disease: Secondary | ICD-10-CM | POA: Diagnosis present

## 2010-11-23 DIAGNOSIS — E872 Acidosis, unspecified: Secondary | ICD-10-CM | POA: Diagnosis not present

## 2010-11-23 DIAGNOSIS — I129 Hypertensive chronic kidney disease with stage 1 through stage 4 chronic kidney disease, or unspecified chronic kidney disease: Secondary | ICD-10-CM | POA: Diagnosis present

## 2010-11-23 DIAGNOSIS — I2582 Chronic total occlusion of coronary artery: Secondary | ICD-10-CM | POA: Diagnosis present

## 2010-11-23 DIAGNOSIS — J9600 Acute respiratory failure, unspecified whether with hypoxia or hypercapnia: Secondary | ICD-10-CM | POA: Diagnosis not present

## 2010-11-23 DIAGNOSIS — N179 Acute kidney failure, unspecified: Secondary | ICD-10-CM | POA: Diagnosis present

## 2010-11-23 DIAGNOSIS — N183 Chronic kidney disease, stage 3 unspecified: Secondary | ICD-10-CM | POA: Diagnosis present

## 2010-11-23 DIAGNOSIS — I5033 Acute on chronic diastolic (congestive) heart failure: Principal | ICD-10-CM | POA: Diagnosis present

## 2010-11-23 DIAGNOSIS — I214 Non-ST elevation (NSTEMI) myocardial infarction: Secondary | ICD-10-CM | POA: Diagnosis present

## 2010-11-23 DIAGNOSIS — Z9861 Coronary angioplasty status: Secondary | ICD-10-CM

## 2010-11-23 DIAGNOSIS — I252 Old myocardial infarction: Secondary | ICD-10-CM

## 2010-11-23 LAB — CBC
Hemoglobin: 15.6 g/dL (ref 13.0–17.0)
Hemoglobin: 15.9 g/dL (ref 13.0–17.0)
MCH: 31.5 pg (ref 26.0–34.0)
MCH: 31.9 pg (ref 26.0–34.0)
MCV: 92.2 fL (ref 78.0–100.0)
Platelets: 231 10*3/uL (ref 150–400)
Platelets: 223 10*3/uL (ref 150–400)
Platelets: 235 10*3/uL (ref 150–400)
RBC: 4.95 MIL/uL (ref 4.22–5.81)
RBC: 4.98 MIL/uL (ref 4.22–5.81)
RDW: 14.8 % (ref 11.5–15.5)
WBC: 12.6 10*3/uL — ABNORMAL HIGH (ref 4.0–10.5)
WBC: 17.8 10*3/uL — ABNORMAL HIGH (ref 4.0–10.5)
WBC: 19.1 10*3/uL — ABNORMAL HIGH (ref 4.0–10.5)

## 2010-11-23 LAB — BASIC METABOLIC PANEL
BUN: 21 mg/dL (ref 6–23)
BUN: 22 mg/dL (ref 6–23)
CO2: 25 mEq/L (ref 19–32)
Chloride: 104 mEq/L (ref 96–112)
Chloride: 104 mEq/L (ref 96–112)
Creatinine, Ser: 2.06 mg/dL — ABNORMAL HIGH (ref 0.4–1.5)
Creatinine, Ser: 1.89 mg/dL — ABNORMAL HIGH (ref 0.4–1.5)
Glucose, Bld: 104 mg/dL — ABNORMAL HIGH (ref 70–99)
Potassium: 5 mEq/L (ref 3.5–5.1)
Potassium: 5.7 mEq/L — ABNORMAL HIGH (ref 3.5–5.1)

## 2010-11-23 LAB — URINALYSIS, ROUTINE W REFLEX MICROSCOPIC
Bilirubin Urine: NEGATIVE
Hgb urine dipstick: NEGATIVE
Ketones, ur: NEGATIVE mg/dL
Protein, ur: NEGATIVE mg/dL
Urobilinogen, UA: 0.2 mg/dL (ref 0.0–1.0)

## 2010-11-23 LAB — COMPREHENSIVE METABOLIC PANEL
ALT: 34 U/L (ref 0–53)
Alkaline Phosphatase: 95 U/L (ref 39–117)
CO2: 17 mEq/L — ABNORMAL LOW (ref 19–32)
Calcium: 8.5 mg/dL (ref 8.4–10.5)
Chloride: 107 mEq/L (ref 96–112)
GFR calc non Af Amer: 40 mL/min — ABNORMAL LOW (ref >60)
Glucose, Bld: 234 mg/dL — ABNORMAL HIGH (ref 70–99)
Potassium: 4.2 mEq/L (ref 3.5–5.1)
Sodium: 140 mEq/L (ref 135–145)
Total Bilirubin: 0.5 mg/dL (ref 0.3–1.2)

## 2010-11-23 LAB — POCT I-STAT 3, ART BLOOD GAS (G3+)
Bicarbonate: 18.7 mEq/L — ABNORMAL LOW (ref 20.0–24.0)
TCO2: 20 mmol/L (ref 0–100)
pCO2 arterial: 32.6 mmHg — ABNORMAL LOW (ref 35.0–45.0)
pH, Arterial: 7.368 (ref 7.350–7.450)

## 2010-11-23 LAB — DIFFERENTIAL
Basophils Absolute: 0 10*3/uL (ref 0.0–0.1)
Basophils Relative: 0 % (ref 0–1)
Eosinophils Absolute: 0 10*3/uL (ref 0.0–0.7)
Lymphocytes Relative: 62 % — ABNORMAL HIGH (ref 12–46)
Lymphs Abs: 7.8 10*3/uL — ABNORMAL HIGH (ref 0.7–4.0)
Monocytes Relative: 5 % (ref 3–12)
Monocytes Relative: 11 % (ref 3–12)
Neutro Abs: 14 10*3/uL — ABNORMAL HIGH (ref 1.7–7.7)
Neutrophils Relative %: 79 % — ABNORMAL HIGH (ref 43–77)

## 2010-11-23 LAB — PROTIME-INR
INR: 1.32 (ref 0.00–1.49)
Prothrombin Time: 16.6 seconds — ABNORMAL HIGH (ref 11.6–15.2)

## 2010-11-23 LAB — HEPATIC FUNCTION PANEL
ALT: 32 U/L (ref 0–53)
AST: 52 U/L — ABNORMAL HIGH (ref 0–37)
Alkaline Phosphatase: 94 U/L (ref 39–117)
Bilirubin, Direct: 0.1 mg/dL (ref 0.0–0.3)
Indirect Bilirubin: 0.5 mg/dL (ref 0.3–0.9)
Total Protein: 7.4 g/dL (ref 6.0–8.3)

## 2010-11-23 LAB — DIGOXIN LEVEL: Digoxin Level: 0.4 ng/mL — ABNORMAL LOW (ref 0.8–2.0)

## 2010-11-23 LAB — CARDIAC PANEL(CRET KIN+CKTOT+MB+TROPI)
Relative Index: 4 — ABNORMAL HIGH (ref 0.0–2.5)
Troponin I: 1.86 ng/mL (ref <0.30)

## 2010-11-23 LAB — MRSA PCR SCREENING: MRSA by PCR: NEGATIVE

## 2010-11-23 LAB — CK TOTAL AND CKMB (NOT AT ARMC): CK, MB: 4.4 ng/mL — ABNORMAL HIGH (ref 0.3–4.0)

## 2010-11-23 LAB — PROCALCITONIN: Procalcitonin: 1.37 ng/mL

## 2010-11-23 LAB — APTT: aPTT: 132 seconds — ABNORMAL HIGH (ref 24–37)

## 2010-11-23 LAB — HEPARIN LEVEL (UNFRACTIONATED): Heparin Unfractionated: 0.59 IU/mL (ref 0.30–0.70)

## 2010-11-23 LAB — MAGNESIUM: Magnesium: 2.3 mg/dL (ref 1.5–2.5)

## 2010-11-23 LAB — LACTIC ACID, PLASMA: Lactic Acid, Venous: 2.4 mmol/L — ABNORMAL HIGH (ref 0.5–2.2)

## 2010-11-24 ENCOUNTER — Inpatient Hospital Stay (HOSPITAL_COMMUNITY): Payer: Self-pay

## 2010-11-24 LAB — COMPREHENSIVE METABOLIC PANEL
AST: 42 U/L — ABNORMAL HIGH (ref 0–37)
CO2: 23 mEq/L (ref 19–32)
Calcium: 8.4 mg/dL (ref 8.4–10.5)
Chloride: 104 mEq/L (ref 96–112)
Creatinine, Ser: 1.99 mg/dL — ABNORMAL HIGH (ref 0.4–1.5)
GFR calc Af Amer: 43 mL/min — ABNORMAL LOW (ref >60)
GFR calc non Af Amer: 35 mL/min — ABNORMAL LOW (ref >60)
Glucose, Bld: 136 mg/dL — ABNORMAL HIGH (ref 70–99)
Total Bilirubin: 0.7 mg/dL (ref 0.3–1.2)

## 2010-11-24 LAB — CARDIAC PANEL(CRET KIN+CKTOT+MB+TROPI)
CK, MB: 9.3 ng/mL (ref 0.3–4.0)
Total CK: 386 U/L — ABNORMAL HIGH (ref 7–232)
Total CK: 299 U/L — ABNORMAL HIGH (ref 7–232)
Troponin I: 2.42 ng/mL (ref <0.30)

## 2010-11-24 LAB — HEPARIN LEVEL (UNFRACTIONATED): Heparin Unfractionated: 0.24 IU/mL — ABNORMAL LOW (ref 0.30–0.70)

## 2010-11-24 LAB — HEMOGLOBIN A1C
Hgb A1c MFr Bld: 5.8 % — ABNORMAL HIGH (ref <5.7)
Mean Plasma Glucose: 120 mg/dL — ABNORMAL HIGH (ref <117)

## 2010-11-24 LAB — CBC
MCH: 31 pg (ref 26.0–34.0)
MCHC: 34.2 g/dL (ref 30.0–36.0)
Platelets: 208 10*3/uL (ref 150–400)
RDW: 14.9 % (ref 11.5–15.5)

## 2010-11-24 LAB — TSH: TSH: 1.055 u[IU]/mL (ref 0.350–4.500)

## 2010-11-24 LAB — CORTISOL: Cortisol, Plasma: 29.2 ug/dL

## 2010-11-24 LAB — POTASSIUM: Potassium: 3.5 mEq/L (ref 3.5–5.1)

## 2010-11-25 HISTORY — PX: CARDIAC CATHETERIZATION: SHX172

## 2010-11-25 LAB — CBC
HCT: 36.9 % — ABNORMAL LOW (ref 39.0–52.0)
Hemoglobin: 12.9 g/dL — ABNORMAL LOW (ref 13.0–17.0)
MCHC: 35 g/dL (ref 30.0–36.0)
MCV: 89.6 fL (ref 78.0–100.0)
WBC: 11.2 10*3/uL — ABNORMAL HIGH (ref 4.0–10.5)

## 2010-11-25 LAB — BASIC METABOLIC PANEL
CO2: 24 mEq/L (ref 19–32)
Chloride: 103 mEq/L (ref 96–112)
GFR calc Af Amer: 59 mL/min — ABNORMAL LOW (ref >60)
Glucose, Bld: 105 mg/dL — ABNORMAL HIGH (ref 70–99)
Sodium: 137 mEq/L (ref 135–145)

## 2010-11-25 LAB — POCT ACTIVATED CLOTTING TIME: Activated Clotting Time: 370 seconds

## 2010-11-26 LAB — CBC
Hemoglobin: 13.4 g/dL (ref 13.0–17.0)
MCH: 31.8 pg (ref 26.0–34.0)
MCV: 90.3 fL (ref 78.0–100.0)
Platelets: 171 10*3/uL (ref 150–400)
RBC: 4.22 MIL/uL (ref 4.22–5.81)

## 2010-11-26 LAB — BASIC METABOLIC PANEL
BUN: 22 mg/dL (ref 6–23)
CO2: 24 mEq/L (ref 19–32)
Chloride: 104 mEq/L (ref 96–112)
Creatinine, Ser: 1.56 mg/dL — ABNORMAL HIGH (ref 0.4–1.5)

## 2010-11-27 LAB — BASIC METABOLIC PANEL
CO2: 23 mEq/L (ref 19–32)
Calcium: 9.2 mg/dL (ref 8.4–10.5)
Creatinine, Ser: 1.45 mg/dL (ref 0.4–1.5)
Glucose, Bld: 100 mg/dL — ABNORMAL HIGH (ref 70–99)

## 2010-12-01 NOTE — Cardiovascular Report (Signed)
[name], [name] NO.:  [id number]  MEDICAL RECORD NO.:  [id number]           PATIENT TYPE:  I  LOCATION:  2041                         FACILITY:  [facility]  PHYSICIAN:  Landry Corporal, MD DATE OF BIRTH:  02/04/1957  DATE OF PROCEDURE:  11/25/2010 DATE OF DISCHARGE:                           CARDIAC CATHETERIZATION   PERFORMING PHYSICIAN:  Landry Corporal, MD  PRIMARY CARDIOLOGIST:  Nicki Guadalajara, MD  PROCEDURES PERFORMED: 1. Left heart catheterization without left ventriculography. 2. Native coronary angiography. 3. Cutting balloon angioplasty alone to an ostial obtuse marginal     reducing a 100% lesion to a 10% lesion. 4. Intracoronary nitroglycerin injection.  INDICATIONS: 1. Non-ST-elevation myocardial infarction. 2. Flash pulmonary edema. 3. Nonischemic cardiomyopathy, on LifeVest.  BRIEF HISTORY:  Steven [name] is a very pleasant 55 year old gentleman with a history of anterior ST elevation MI in January 2012 treated with bare- metal stent to the LAD.  He subsequently was found to have ischemic cardiomyopathy with EF of 30-35% range.  He was discharged on LifeVest as he had some ventricle tachycardia peri-infarct.  He has been doing relatively well with titrating of his medications but recently had his diuretic dose decreased.  He then presented on the May 30 with flash pulmonary edema.  Denied any chest discomfort consistent with a previous MI brought sudden onset of dyspnea after a couple of days of worsening dyspnea on exertion.  He was known to have an existing circumflex and RCA disease at the time of his initial catheterization.  Initial evaluation showed elevation of troponin into 2.7.  The patient remained in pulmonary edema over the course of the first evening despite being on BiPAP and intravenous nitroglycerin.  He was then stabilized briefly at acute on chronic renal insufficiency with a creatinine of 2 and is now back down to  1.5.  Based on his elevated troponin, he was started on Integrilin in addition to heparin and he is taking Effient and aspirin. He is now stable for invasive cardiac evaluation for his coronary anatomy.  The risks, benefits, alternatives and indications of procedure were explained to the patient in detail and informed consent was obtained with a sign and placed on the chart.  PROCEDURE:  The patient was brought to the Second [facility] in a fasting state.  He was prepped and draped in usual sterile fashion for the right femoral access.  A time- out period was performed and the patient the patient was sedated with intravenous Versed and fentanyl.  The right femoral head was then localized using fluoroscopic guidance.  The right groin was anesthetized using 1% subcutaneous lidocaine and the right femoral artery was accessed using modified Seldinger technique with placement of a 5-French sheath.  First a 5-French JL-4 catheter was advanced over wire and multiple angiographic views of the left coronary artery system were obtained.  This was exchanged for 5-French JR-4 catheter was advanced over wire and multiple angiographic views of the right coronary artery system was obtained.  The JR-4 catheter was then advanced across the aortic valve for measurement of left  ventricle hemodynamics.  However this was unsuccessful and was exchanged over wire for a 5-French pigtail catheter which did successfully cross the aortic valve and left ventricular hemodynamics were measured and the catheter was pulled back across the aortic valve.  As the decision was made at that time to proceed with intervention, left ventriculogram was not performed as he had recent evaluation of his ejection fraction known to be decreased with recent flash pulmonary edema.  After completion of the left ventriculogram, the catheter was removed completely out of the body over a wire without  any complications.  At this time the angiographic views were evaluated and decision was made to proceed with a percutaneous coronary intervention on the first obtuse marginal branch of the left circumflex vessel.  See final procedure below.  HEMODYNAMIC RESULTS: 1. Left ventricular pressures is 103/ 9 mmHg with an EDP of 19 mmHg. 2. Central aortic pressure is 103/68 mmHg with a mean of 84 mmHg.  ANGIOGRAPHIC FINDINGS: 1. The left main is a large-caliber vessel that bifurcates the LAD and     circumflex with no significant disease. 2. LAD is a large-caliber vessel, has a slight step-down of 30%     proximally and the early mid stent that was placed in January was     patent may be 10-20% stenosis.  There is a large distribution of     small to moderate-sized diagonal branch that comes out right at the     initial portion of the stent and has a 80% ostial narrowing which     looks to be quite similar to the poststenting films from previous     catheterization.  The remainder of the LAD has diffuse luminal     irregularities but not significant and gives rise to get another     diagonal branch that is small vessels and small distribution.  The     distal portion of the LAD actually wraps around the apex and     bifurcates. 3. Circumflex is a moderate caliber vessel which essentially     trifurcates into first and second obtuse marginal and     atrioventricular groove branch.  The first obtuse marginal has     proximal 100% occlusion.  It is noted to still briskly to be a     right to left collaterals from left posterolateral system showing     if this vessel branch is at least in the 2 main branches each of     which have very small branches.  The retrograde perfusion is all     way up to the point of occlusion suggestive of short extensive     occlusion. 4. The right coronary artery is a diffuse 40% lesions in the mid     vessel at the point of an acute marginal branch.  The      posterolateral system is free of any disease with he posterior     ascending has a mid 80% stenosis which is roughly the same as was     in the postangiographic findings before.  INITIAL IMPRESSION: 1. Culprit lesion is 100% first obtuse marginal branch with right-to-     left laterals. 2. Patent left anterior descending stent and stable right coronary     artery disease.  INITIAL PLAN:  Angioplasty of the obtuse marginal.  At this time, the patient was on Integrilin drip and therefore an ACT was checked and additional 4000 units of heparin was administered to get  an ACT over 200 seconds.  He was also already on prasugrel and therefore prasugrel was given.  The 5-French arterial sheath was exchanged for a 6-French arterial sheath.  INTERVENTION PROCEDURE:  Lesion proximal OM-1:  100% with TIMI 0 flow and brisk collaterals.  Non-high seat bifurcating lesion.  Post PTCA less than 10% stenosis with TIMI 3 flow.  No dissection or perforation.  1. Guide catheter 6-French XB 3.5. 2. Guide wire is a Prowater 190 cm. 3. Predilatation balloon Apex Monorail 2.0 mm x 12 mm:  8 atmospheres     for 120 seconds. 4. Cutting balloon is Flextone Monorail 2.25 mL x 10 mm inflated:  10     atmospheres for 120 seconds. 5. Post angioplasty angiography revealed near total resolution of the     100% stenosis down to less than 10% with initially having some     spasm in the AV groove portion of this bifurcation lesion.  After     intracoronary nitroglycerin was infused 200 mcg, the spasm was     relieved.  There is no dissection or perforation noted in brisk     TIMI 3 flow distally.  BRIEF DISCUSSION:  As the lesion was in the very ostial proximal portion of the obtuse marginal, optimal treatment plan of stenting was involved but required jailing of the relatively large distribution remaining circumflex vessels which include another obtuse marginal and intraventricular groove circumflex with  posterolateral branches.  With this in mind in the small caliber vessel 2.0 x 2.25 mm vessel, decision was made to attempt cutting balloon angioplasty alone, also in light of the fact that he is likely planning to have a defibrillator placed and this type of lesion will be best served with a drug-eluting stent and we want to stop the prasugrel for his defibrillator.  The postangioplasty images demonstrated new-stent like results.  The patient was symptom free during the entire procedure.  No complications.  He was stable before, during, and after procedure.  Estimated blood loss less than 10 mL.  CATH LAB STATISTICS:  Sedation was 2 mg of Versed and 50 mcg of fentanyl.  Total contrast was 150 mL.  FINAL IMPRESSION: 1. Successful stent-like cutting balloon angioplasty of the ostial     first obtuse marginal branch with no dissection or perforation. 2. Mildly elevated Left ventricular end diastolic pressure of 90 mmHg     after extensive diuresis. 3. Stable left anterior descending stent and right coronary disease.  PLAN: 1. Continue Integrilin drip for 18 hours with a decreased renal dose     and then continue dual antiplatelets for at least a month.  Have     evidence of so for the somewhat.  The medics hydrate to normal     saline at 58 2. We will hydrate with normal saline.  Post PCI, we will continue     with Lasix diuresis. 3. Continue titrate of cardiac medications if blood pressure     stabilizes.  We will hold ACE inhibitor due to hypotension and     renal insufficiency. 4. We will likely discharge after the weekend and he will be on     LifeVest after discharge.          ______________________________ Landry Corporal, MD     DWH/MEDQ  D:  11/26/2010  T:  11/27/2010  Job:  [id number]  cc:   Nicki Guadalajara, M.D. [facility]  Electronically  Signed by Bryan Lemma MD on 12/01/2010  12:44:42 AM

## 2010-12-08 NOTE — Discharge Summary (Signed)
Steven [name], Steven [name] NO.:  [id number]  MEDICAL RECORD NO.:  [id number]  LOCATION:  2041                         FACILITY:  [facility]  PHYSICIAN:  Nicki Guadalajara, M.D.     DATE OF BIRTH:  02/04/1957  DATE OF ADMISSION:  11/23/2010 DATE OF DISCHARGE:  11/28/2010                              DISCHARGE SUMMARY   DISCHARGE DIAGNOSES: 1. Acute pulmonary edema on admission. 2. Respiratory failure secondary to acute pulmonary edema on     admission. 3. Ischemic cardiomyopathy with an ejection fraction of 30% to 35% by     echocardiogram in January 2012. 4. Non-ST-elevation myocardial infarction this admission with peak CK     of 485 and 19 MB, treated with an angioplasty to an obtuse marginal     branch. 5. Known coronary disease with left anterior descending drug-eluting     stent placement in the setting of an myocardial infarction in     January 2012. 6. Nonsustained ventricular tachycardia, the patient is discharged     with a LifeVest. 7. Treated hypertension, now somewhat hypotensive. 8. Stage III renal insufficiency, creatinine 1.56.  HOSPITAL COURSE:  The patient is a 55 year old African male who we initially saw in January 2012 when he presented with an anterior MI with a total LAD that was treated with bare-metal stent to his LAD.  His EF at that time was 30% to 35%.  He was discharged on LifeVest as he had some nonsustained V-tach perioperatively.  He presented on Nov 23, 2010, with flash pulmonary edema and respiratory failure.  CK-MBs were positive.  He was treated for his acute pulmonary edema and non-ST- elevation MI.  Ultimately, we took him to the cath lab on November 25, 2010, when he was more stable.  Catheterization revealed a 40% mid RCA, normal left main, normal circumflex with a totally occluded OM-1.  The LAD stent was patent with only a 10% to 20% restenosis, the diagonal had an 80% stenosis.  There were right-to-left collaterals noted.  He did  have a distal 80% PDA as well.  He was treated with angioplasty to the OM lesion.  We hydrated him as gently as possible as he does have renal insufficiency and his creatinine went to 1.56.  This seemed to stabilize.  He was hypotensive and we had to back off on some of his medications including his ACE inhibitor and nitrates.  We feel he can be discharged on November 28, 2010.  He does have his LifeVest and knows how to wear it, so he will resume this at home.  He will follow up with Dr. Tresa Endo as an outpatient.  Please see med rec for complete discharge medications.  Labs at discharge show a sodium 136, potassium 4.0, BUN 27, creatinine 1.45, white count 9.3, hemoglobin 13.4, hematocrit 38.1, platelets 171. CKs peaked at 45 with 19 MBs.  His EKG shows sinus rhythm with poor anterior R-wave progression and lateral T-wave inversion.  DISPOSITION:  The patient was discharged in stable condition.  He will follow up with Dr. Tresa Endo.  He knows how to wear his LifeVest and he will resume that.  We may need  to cut back on his diuretic as an outpatient but for now he is on 40 mg b.i.d. of Lasix.     Steven [name], P.A.   ______________________________ Nicki Guadalajara, M.D.    Steven [name]  D:  11/28/2010  T:  11/29/2010  Job:  [id number]  Electronically Signed by Corine Shelter P.A. on 12/06/2010 08:49:54 AM Electronically Signed by Nicki Guadalajara M.D. on 12/08/2010 04:13:59 PM

## 2011-03-02 ENCOUNTER — Ambulatory Visit (HOSPITAL_COMMUNITY)
Admission: RE | Admit: 2011-03-02 | Discharge: 2011-03-03 | Disposition: A | Discharge status: Home / Self Care | Payer: Medicaid Other | Source: Ambulatory Visit | Attending: Cardiovascular Disease | Admitting: Cardiovascular Disease

## 2011-03-02 ENCOUNTER — Ambulatory Visit (HOSPITAL_COMMUNITY): Payer: Medicaid Other

## 2011-03-02 DIAGNOSIS — I509 Heart failure, unspecified: Secondary | ICD-10-CM | POA: Insufficient documentation

## 2011-03-02 DIAGNOSIS — Z9861 Coronary angioplasty status: Secondary | ICD-10-CM | POA: Insufficient documentation

## 2011-03-02 DIAGNOSIS — I2589 Other forms of chronic ischemic heart disease: Secondary | ICD-10-CM | POA: Insufficient documentation

## 2011-03-02 DIAGNOSIS — I2510 Atherosclerotic heart disease of native coronary artery without angina pectoris: Secondary | ICD-10-CM | POA: Insufficient documentation

## 2011-03-02 DIAGNOSIS — I5022 Chronic systolic (congestive) heart failure: Secondary | ICD-10-CM | POA: Insufficient documentation

## 2011-03-02 HISTORY — PX: CARDIAC DEFIBRILLATOR PLACEMENT: SHX171

## 2011-03-03 ENCOUNTER — Ambulatory Visit (HOSPITAL_COMMUNITY): Payer: Medicaid Other

## 2011-03-04 NOTE — Op Note (Signed)
NAMEARVAL, BRANDSTETTER NO.:  [id number]  MEDICAL RECORD NO.:  [id number]  LOCATION:  2807                         FACILITY:  [facility]  PHYSICIAN:  Thurmon Fair, MD     DATE OF BIRTH:  02/04/1957  DATE OF PROCEDURE:  03/02/2011 DATE OF DISCHARGE:                              OPERATIVE REPORT   PROCEDURES PERFORMED: 1. Implantation of new dual-chamber defibrillator. 2. Fluoroscopy. 3. Moderate sedation. 4. Defibrillator lead and defibrillation threshold testing.  REASON FOR THE PROCEDURES:  Severe ischemic cardiomyopathy with left ventricular ejection fraction less than 35% and congestive heart failure, New York Heart Association functional class II, on comprehensive medical therapy; status post myocardial infarction (MADIT- II criteria).  PROCEDURE PERFORMED BY:  Thurmon Fair, MD  ASSISTANT:  Theo Dills, RN  MEDICATIONS ADMINISTERED:  Ancef 2 grams intravenously, lidocaine 1% 30 mL locally, Versed 4 mg intravenously and fentanyl 100 mcg intravenously.  COMPLICATIONS:  None.  ESTIMATED BLOOD LOSS:  Less than 5 mL.  DEVICE DETAILS:  The defibrillator generator is a Medtronic Protecta, model number [id number], serial number [id number].  Ventricular/defibrillator lead is a [employer], serial number [id number].  The atrial lead is a Medtronic 5076 - 52 cm, serial number [id number].  After risks and benefits of the procedure were described, the patient provided informed consent.  He was brought to the Cardiac Cath Lab in the fasting state and prepped and draped in usual sterile fashion. Local anesthesia 1% lidocaine was administered to the left infraclavicular area.  A 6-cm horizontal incision was made parallel to the inferior border of the left clavicle, roughly 3 cm caudal to it. Using electrocautery and blunt dissection, a prepectoral pocket was created down to the level of the muscle fascia and carefully treated for good  hemostasis.  An antibiotic soaked sponge was placed in the pocket.  Under fluoroscopic guidance, using the modified Seldinger technique and two separate venipuncture sticks, the left subclavian vein was accessed and two J-tipped guidewires were placed.  These were subsequently exchanged for a 9-French and 7-French safe sheaths respectively.  Under fluoroscopic guidance, the defibrillator lead was advanced to the level of the right ventricular apical septum.  The active fixation helix was deployed.  There was prominent current of injury and excellent sensing and pacing parameters.  There was no evidence of diaphragmatic/phrenic nerve stimulation at maximum device output.  The safe sheath was peeled away and the lead was secured in place using 2-0 silk.  In a similar fashion, the atrial lead was advanced to the level right of the atrial appendage and the active fixation helix was deployed.  There was only modest current of injury, but sensing and pacing thresholds were excellent and mobilization of the lead demonstrated a secure position.  There was no evidence of diaphragmatic/phrenic nerve stimulation at maximum device output.  The sheath was peeled away and the lead was secured in place using 2-0 silk.  The antibiotic-soaked sponge was removed from the pocket.  The pocket was then flushed with copious amounts of antibiotic solution and reinspected for hemostasis.  The ventricular lead was attached to the device and appropriate ventricular pacing was seen.  The high-voltage pins were also attached and the atrial lead was connected with atrial paced ventricular sensed rhythm then been noted.  Defibrillation threshold testing was then performed.  The patient received additional intravenous sedation.  Ventricular fibrillation was then successfully induced in the first attempt with a 1 joule shock on T procedure.  The rhythm was appropriately detected, charge time was 2.7 seconds and a  single 15-joule shock delivered via the defibrillator led to the immediate conversion to sinus rhythm.  No sensing dropouts were noticed during the detection.  The care had been previously taken when the defibrillator was placed in the pocket at the leads will be located deep to the device.  The pocket was then closed using two layers of 2-0 Vicryl and cutaneous staples, after which, a sterile pressure dressing was applied.  No immediate complications occurred.  Note was made of heavy pericardial calcification especially around the atria during implantation.  The following electronic parameters were encountered at the end of procedure.  Right atrial lead sensed P-waves 3.5 millivolts, impedance 629 ohms, threshold 0.4 volts at 0.5 milliseconds pulse width, current 0.5 milliamps.  Right ventricular lead sensed R-waves 18 millivolts, impedance of 663 ohms, threshold 0.7 volts at 0.5 milliseconds pulse width and current 0.9 milliamps, high-voltage impedance was 54 ohms, proximal coil 43 ohms distal coil.  During induction, the measured impedance was 43 ohms.  The charge time was 2.7 seconds, the total duration of the event was 6 seconds.  Following induction, testing via the device and radiofrequency analyzer, right atrial lead parameters were as follows; P-wave 3.1 millivolts, impedance 437 ohms, threshold 0.5 volts at 0.4 milliseconds pulse width. Right ventricular lead sensed R-wave 10 millivolts, impedance 494 ohms and threshold 0.75 volts at 0.4 milliseconds pulse width.     Thurmon Fair, MD     MC/MEDQ  D:  03/02/2011  T:  03/02/2011  Job:  [id number]  cc:   [facility]  Electronically Signed by Thurmon Fair M.D. on 03/04/2011 09:51:01 AM

## 2011-03-04 NOTE — Discharge Summary (Signed)
  NAMEEZIAH, NEGRO NO.:  [id number]  MEDICAL RECORD NO.:  [id number]  LOCATION:  2010                         FACILITY:  [facility]  PHYSICIAN:  Thurmon Fair, MD     DATE OF BIRTH:  02/04/1957  DATE OF ADMISSION:  03/02/2011 DATE OF DISCHARGE:  03/03/2011                              DISCHARGE SUMMARY   DISCHARGE DIAGNOSES: 1. Ischemic cardiomyopathy with ejection fraction less than 25%.     a.     Placement of ICD Medtronic generator secondary to Madit II      criteria. 2. Coronary artery disease with history of ST-elevation myocardial     infarction in January 2012 and followup angioplasty cutting balloon     atherectomy of the marginal vessel in June 2012 and continued     nonobstructive coronary disease. 3. Chronic systolic heart failure with New York Heart Association     class II.  DISCHARGE CONDITION:  Stable.  PROCEDURES:  Placement of Medtronic ICD generator, serial number [id number] in the left subclavian base.  DISCHARGE MEDICATIONS:  Continue prehospitalization medications.  See medication reconciliation sheet.  DISCHARGE INSTRUCTIONS: 1. No work until cleared by Dr. Royann Shivers. 2. No driving until March 09, 2011. 3. See pacemaker ICD instruction sheet for further instructions on     wound care. 4. Low-sodium heart-healthy diet. 5. Follow up with Dr. Royann Shivers at [facility] on March 14, 2011,     at 10:15 a.m.  HOSPITAL COURSE:  The patient presented electively for ICD placement secondary to Madit II criteria and the patient's history of ST-elevation MI in January 2012 with non-drug-eluting stent placed to the LAD and followup PTCA and cutting balloon atherectomy to marginal vessel in June 2012.  He has class II New York Heart classification heart failure.  His EF initially did improve to 32%, but on most recent 2-D echo was less than 25%.  He has been wearing a LifeVest.  Plans were for elective ICD implantation, which was  completed on March 02, 2011, by Dr. Royann Shivers. The morning of March 03, 2011, the patient was atrially pacing.  He had, had three beats of ventricular tachycardia, but otherwise no complaints. He has mild incisional discomfort.  The site itself is well approximated.  The patient will ambulate and be discharged home.  Chest x-ray revealed no pneumothorax and no adverse effects of ICD placement.     Darcella Gasman. Annie Paras, N.P.   ______________________________ Thurmon Fair, MD    LRI/MEDQ  D:  03/03/2011  T:  03/03/2011  Job:  [id number]  cc:   Nicki Guadalajara, M.D.  Electronically Signed by Nada Boozer N.P. on 03/03/2011 05:33:41 PM Electronically Signed by Thurmon Fair M.D. on 03/04/2011 09:51:08 AM

## 2011-04-24 ENCOUNTER — Encounter (HOSPITAL_COMMUNITY): Payer: Medicaid Other

## 2011-04-26 ENCOUNTER — Encounter (HOSPITAL_COMMUNITY): Payer: Medicaid Other

## 2011-04-28 ENCOUNTER — Encounter (HOSPITAL_COMMUNITY): Payer: Medicaid Other

## 2011-05-01 ENCOUNTER — Encounter (HOSPITAL_COMMUNITY): Payer: Medicaid Other

## 2011-05-01 DIAGNOSIS — I2589 Other forms of chronic ischemic heart disease: Secondary | ICD-10-CM | POA: Insufficient documentation

## 2011-05-01 DIAGNOSIS — I5032 Chronic diastolic (congestive) heart failure: Secondary | ICD-10-CM | POA: Insufficient documentation

## 2011-05-01 DIAGNOSIS — I509 Heart failure, unspecified: Secondary | ICD-10-CM | POA: Insufficient documentation

## 2011-05-01 DIAGNOSIS — Z5189 Encounter for other specified aftercare: Secondary | ICD-10-CM | POA: Insufficient documentation

## 2011-05-01 DIAGNOSIS — N183 Chronic kidney disease, stage 3 unspecified: Secondary | ICD-10-CM | POA: Insufficient documentation

## 2011-05-01 DIAGNOSIS — Z9861 Coronary angioplasty status: Secondary | ICD-10-CM | POA: Insufficient documentation

## 2011-05-01 DIAGNOSIS — I252 Old myocardial infarction: Secondary | ICD-10-CM | POA: Insufficient documentation

## 2011-05-01 DIAGNOSIS — I129 Hypertensive chronic kidney disease with stage 1 through stage 4 chronic kidney disease, or unspecified chronic kidney disease: Secondary | ICD-10-CM | POA: Insufficient documentation

## 2011-05-01 NOTE — Progress Notes (Signed)
Pt tolerated first cardiac rehab exercise session without difficulty.  Denies cp or dyspnea.  VSS.  Telemetry-sinus rhythm, poor r wave progression with non specific STT changes, t wave inversion, occasional unifocal PVC.  Will continue to monitor pt throughout cardiac rehab sessions.  jrion,rn

## 2011-05-03 ENCOUNTER — Encounter (HOSPITAL_COMMUNITY): Payer: Medicaid Other

## 2011-05-03 NOTE — Progress Notes (Signed)
Steven [name] 55 y.o. male       Nutrition Screen                                                                    YES  NO Do you live in a nursing home?  X   Do you eat out more than 3 times/week?    X If yes, how many times per week do you eat out?  Do you have food allergies?   X If yes, what are you allergic to?  Have you gained or lost more than 10 lbs without trying?               X If yes, how much weight have you lost and over what time period?    lbs gained or lost over  weeks/month  Do you want to lose weight?     X If yes, what is a goal weight or amount of weight you would like to lose?  Do you eat alone most of the time?  X     Do you eat less than 2 meals/day?  X If yes, how many meals do you eat?  Do you drink more than 3 alcohol drinks/day?  X If yes, how many drinks per day?  Are you having trouble with constipation? *  X If yes, what are you doing to help relieve constipation?  Do you have financial difficulties with buying food?*    X   Are you experiencing regular nausea/ vomiting?*     X   Do you have a poor appetite? *                                        X   Do you have trouble chewing/swallowing? *   X    Pt with diagnoses of:  X MI                     X CHF  X Stent/ PTCA     X Pre-diabetes        XAVR/MVR/AICD      X %  Body fat >goal / Body Mass Index >25 X HTN / BP >120/80       Pt Risk Score    1       Diagnosis Risk Score  80       Total Risk Score   81                        X High Risk                   Low Risk              HT: 70" WT: 77.9 kg (171.4 #)  IBW 75.5 103%IBW 27.1%body fat  has a current medication list which includes the following prescription(s): aspirin, carvedilol, furosemide, nitroglycerin, prasugrel, simvastatin, and spironolactone. PMH: CKD stage III, Crea 1.56      Activity level: Pt is active  Wt goal: 171# (77.9) Current tobacco use?  No     Pt quit tobacco use in  06/2010  Food/Drug Interaction? No      Labs:  11/27/10  Glucose 100 H Lab Results  Component Value Date   HGBA1C  Value: 5.8 (NOTE)                                                                       According to the ADA Clinical Practice Recommendations for 2011, when HbA1c is used as a screening test:   >=6.5%   Diagnostic of Diabetes Mellitus           (if abnormal result  is confirmed)  5.7-6.4%   Increased risk of developing Diabetes Mellitus  References:Diagnosis and Classification of Diabetes Mellitus,Diabetes Care,2011,34(Suppl 1):S62-S69 and Standards of Medical Care in         Diabetes - 2011,Diabetes Care,2011,34  (Suppl 1):S11-S61.* 11/23/2010    LDL goal:  < 70      MI, DM, Carotid or PVD and > 2:      tobacco, HTN, HDL, family h/o, lipoprotein a     > 56 yo male or        >56 yo male   Estimated Daily Nutrition Needs for: ? wt maintenance 2400-2600 Kcal , Total Fat 80-86gm, Saturated Fat 18-20 gm, Trans Fat 2.7-2.9 gm,  Sodium 1500 mg or less

## 2011-05-05 ENCOUNTER — Encounter (HOSPITAL_COMMUNITY): Payer: Medicaid Other

## 2011-05-08 ENCOUNTER — Encounter (HOSPITAL_COMMUNITY): Payer: Medicaid Other

## 2011-05-10 ENCOUNTER — Encounter (HOSPITAL_COMMUNITY): Payer: Medicaid Other

## 2011-05-12 ENCOUNTER — Encounter (HOSPITAL_COMMUNITY)
Admission: RE | Admit: 2011-05-12 | Discharge: 2011-05-12 | Disposition: A | Discharge status: Home / Self Care | Payer: Medicaid Other | Source: Ambulatory Visit | Attending: Cardiovascular Disease | Admitting: Cardiovascular Disease

## 2011-05-12 NOTE — Progress Notes (Signed)
Reviewed home exercise with pt today.  Pt plans to walk outdoors for exercise.  Reviewed THR, pulse, RPE, sign and symptoms, NTG use, and when to call 911 or MD.  Pt voiced understanding.

## 2011-05-15 ENCOUNTER — Encounter (HOSPITAL_COMMUNITY)
Admission: RE | Admit: 2011-05-15 | Discharge: 2011-05-15 | Disposition: A | Discharge status: Home / Self Care | Payer: Medicaid Other | Source: Ambulatory Visit | Attending: Cardiovascular Disease | Admitting: Cardiovascular Disease

## 2011-05-15 NOTE — Progress Notes (Signed)
Pt c/o orthopnea, relieved with position change.  Pt uses 2 pillows, same as usual.  Weight stable today. Pt denies dyspnea on exertion or edema.  Lungs clear with faint rales on left.  PC to Dr. Landry Dyke office to report sx.  LM for triage nurse to return call. -jrion,rn 1700 return call received from [facility] reporting they did speak to Pt and advised him to continue his current regimen.  Will continue to monitor sx.-jrion,rn

## 2011-05-17 ENCOUNTER — Encounter (HOSPITAL_COMMUNITY)
Admission: RE | Admit: 2011-05-17 | Discharge: 2011-05-17 | Disposition: A | Discharge status: Home / Self Care | Payer: Medicaid Other | Source: Ambulatory Visit | Attending: Cardiovascular Disease | Admitting: Cardiovascular Disease

## 2011-05-22 ENCOUNTER — Encounter (HOSPITAL_COMMUNITY)
Admission: RE | Admit: 2011-05-22 | Discharge: 2011-05-22 | Disposition: A | Discharge status: Home / Self Care | Payer: Medicaid Other | Source: Ambulatory Visit | Attending: Cardiovascular Disease | Admitting: Cardiovascular Disease

## 2011-05-24 ENCOUNTER — Encounter (HOSPITAL_COMMUNITY)
Admission: RE | Admit: 2011-05-24 | Discharge: 2011-05-24 | Disposition: A | Discharge status: Home / Self Care | Payer: Medicaid Other | Source: Ambulatory Visit | Attending: Cardiovascular Disease | Admitting: Cardiovascular Disease

## 2011-05-26 ENCOUNTER — Encounter (HOSPITAL_COMMUNITY)
Admission: RE | Admit: 2011-05-26 | Discharge: 2011-05-26 | Disposition: A | Discharge status: Home / Self Care | Payer: Medicaid Other | Source: Ambulatory Visit | Attending: Cardiovascular Disease | Admitting: Cardiovascular Disease

## 2011-05-26 NOTE — Progress Notes (Signed)
Steven [name] 55 y.o. male Nutrition Note  Spoke with pt.  Nutrition Plan and Nutrition Survey reviewed with pt.  Pt making heart healthy food choices most of the time.  Per discussion with pt, pt is consuming 1-2 servings of fruits/vegetables daily.  Pt encouraged to increase fruits and veggies to a minimum of 5 servings daily.  Low-sodium nutrition therapy and pt's CHF discussed.  Reasoning behind low-sodium recommendation explained.  A1c/pre-diabetes discussed.  Pt expressed understanding.    Nutrition Diagnosis   Food-and nutrition-related knowledge deficit related to lack of exposure to information as related to diagnosis of: ? CVD ? Pre-DM (A1c 5.8)   Nutrition RX/ Estimated Daily Nutrition Needs for: wt maintenance 2400-2600 Kcal, 80-86 gm fat, 18-20 gm sat fat, 2.7-2.9 gm trans-fat, <1500 mg sodium Nutrition Intervention   Pt's individual nutrition plan including cholesterol goals reviewed with pt.   Benefits of adopting Therapeutic Lifestyle Changes discussed when Medficts reviewed.   Pt to attend the Portion Distortion class   Pt to attend the  ? Nutrition I class                          ? Nutrition II class    Pt given handouts for: ?Pre-diabetes ? low sodium   Continue client-centered nutrition education by RD, as part of interdisciplinary care. Goal(s)   Pt to describe the benefit of including fruits, vegetables, whole grains, and low-fat dairy products in a heart healthy meal plan. Monitor and Evaluate progress toward nutrition goal with team.

## 2011-05-29 ENCOUNTER — Encounter (HOSPITAL_COMMUNITY)
Admission: RE | Admit: 2011-05-29 | Discharge: 2011-05-29 | Disposition: A | Discharge status: Home / Self Care | Payer: Medicaid Other | Source: Ambulatory Visit | Attending: Cardiovascular Disease | Admitting: Cardiovascular Disease

## 2011-05-29 DIAGNOSIS — N183 Chronic kidney disease, stage 3 unspecified: Secondary | ICD-10-CM | POA: Insufficient documentation

## 2011-05-29 DIAGNOSIS — I5032 Chronic diastolic (congestive) heart failure: Secondary | ICD-10-CM | POA: Insufficient documentation

## 2011-05-29 DIAGNOSIS — I509 Heart failure, unspecified: Secondary | ICD-10-CM | POA: Insufficient documentation

## 2011-05-29 DIAGNOSIS — Z9861 Coronary angioplasty status: Secondary | ICD-10-CM | POA: Insufficient documentation

## 2011-05-29 DIAGNOSIS — I252 Old myocardial infarction: Secondary | ICD-10-CM | POA: Insufficient documentation

## 2011-05-29 DIAGNOSIS — I2589 Other forms of chronic ischemic heart disease: Secondary | ICD-10-CM | POA: Insufficient documentation

## 2011-05-29 DIAGNOSIS — I129 Hypertensive chronic kidney disease with stage 1 through stage 4 chronic kidney disease, or unspecified chronic kidney disease: Secondary | ICD-10-CM | POA: Insufficient documentation

## 2011-05-29 DIAGNOSIS — Z5189 Encounter for other specified aftercare: Secondary | ICD-10-CM | POA: Insufficient documentation

## 2011-05-31 ENCOUNTER — Encounter (HOSPITAL_COMMUNITY)
Admission: RE | Admit: 2011-05-31 | Discharge: 2011-05-31 | Disposition: A | Discharge status: Home / Self Care | Payer: Medicaid Other | Source: Ambulatory Visit | Attending: Cardiovascular Disease | Admitting: Cardiovascular Disease

## 2011-06-02 ENCOUNTER — Encounter (HOSPITAL_COMMUNITY)
Admission: RE | Admit: 2011-06-02 | Discharge: 2011-06-02 | Disposition: A | Discharge status: Home / Self Care | Payer: Medicaid Other | Source: Ambulatory Visit | Attending: Cardiovascular Disease | Admitting: Cardiovascular Disease

## 2011-06-05 ENCOUNTER — Encounter (HOSPITAL_COMMUNITY)
Admission: RE | Admit: 2011-06-05 | Discharge: 2011-06-05 | Disposition: A | Discharge status: Home / Self Care | Payer: Medicaid Other | Source: Ambulatory Visit | Attending: Cardiovascular Disease | Admitting: Cardiovascular Disease

## 2011-06-07 ENCOUNTER — Encounter (HOSPITAL_COMMUNITY)
Admission: RE | Admit: 2011-06-07 | Discharge: 2011-06-07 | Disposition: A | Discharge status: Home / Self Care | Payer: Medicaid Other | Source: Ambulatory Visit | Attending: Cardiovascular Disease | Admitting: Cardiovascular Disease

## 2011-06-09 ENCOUNTER — Encounter (HOSPITAL_COMMUNITY)
Admission: RE | Admit: 2011-06-09 | Discharge: 2011-06-09 | Disposition: A | Discharge status: Home / Self Care | Payer: Medicaid Other | Source: Ambulatory Visit | Attending: Cardiovascular Disease | Admitting: Cardiovascular Disease

## 2011-06-12 ENCOUNTER — Encounter (HOSPITAL_COMMUNITY)
Admission: RE | Admit: 2011-06-12 | Discharge: 2011-06-12 | Disposition: A | Discharge status: Home / Self Care | Payer: Medicaid Other | Source: Ambulatory Visit | Attending: Cardiovascular Disease | Admitting: Cardiovascular Disease

## 2011-06-14 ENCOUNTER — Encounter (HOSPITAL_COMMUNITY)
Admission: RE | Admit: 2011-06-14 | Discharge: 2011-06-14 | Disposition: A | Discharge status: Home / Self Care | Payer: Medicaid Other | Source: Ambulatory Visit | Attending: Cardiovascular Disease | Admitting: Cardiovascular Disease

## 2011-06-16 ENCOUNTER — Encounter (HOSPITAL_COMMUNITY)
Admission: RE | Admit: 2011-06-16 | Discharge: 2011-06-16 | Disposition: A | Discharge status: Home / Self Care | Payer: Medicaid Other | Source: Ambulatory Visit | Attending: Cardiovascular Disease | Admitting: Cardiovascular Disease

## 2011-06-21 ENCOUNTER — Encounter (HOSPITAL_COMMUNITY): Payer: Medicaid Other

## 2011-06-23 ENCOUNTER — Encounter (HOSPITAL_COMMUNITY): Payer: Medicaid Other

## 2011-06-26 ENCOUNTER — Encounter (HOSPITAL_COMMUNITY): Payer: Medicaid Other

## 2011-06-28 ENCOUNTER — Encounter (HOSPITAL_COMMUNITY): Payer: Medicaid Other

## 2011-06-28 DIAGNOSIS — I509 Heart failure, unspecified: Secondary | ICD-10-CM | POA: Insufficient documentation

## 2011-06-28 DIAGNOSIS — Z5189 Encounter for other specified aftercare: Secondary | ICD-10-CM | POA: Insufficient documentation

## 2011-06-28 DIAGNOSIS — I5032 Chronic diastolic (congestive) heart failure: Secondary | ICD-10-CM | POA: Insufficient documentation

## 2011-06-28 DIAGNOSIS — I252 Old myocardial infarction: Secondary | ICD-10-CM | POA: Insufficient documentation

## 2011-06-28 DIAGNOSIS — I129 Hypertensive chronic kidney disease with stage 1 through stage 4 chronic kidney disease, or unspecified chronic kidney disease: Secondary | ICD-10-CM | POA: Insufficient documentation

## 2011-06-28 DIAGNOSIS — I2589 Other forms of chronic ischemic heart disease: Secondary | ICD-10-CM | POA: Insufficient documentation

## 2011-06-28 DIAGNOSIS — N183 Chronic kidney disease, stage 3 unspecified: Secondary | ICD-10-CM | POA: Insufficient documentation

## 2011-06-28 DIAGNOSIS — Z9861 Coronary angioplasty status: Secondary | ICD-10-CM | POA: Insufficient documentation

## 2011-06-30 ENCOUNTER — Encounter (HOSPITAL_COMMUNITY)
Admission: RE | Admit: 2011-06-30 | Discharge: 2011-06-30 | Disposition: A | Discharge status: Home / Self Care | Payer: Medicaid Other | Source: Ambulatory Visit | Attending: Cardiovascular Disease | Admitting: Cardiovascular Disease

## 2011-07-03 ENCOUNTER — Other Ambulatory Visit (HOSPITAL_COMMUNITY): Payer: Self-pay | Admitting: Cardiovascular Disease

## 2011-07-03 ENCOUNTER — Encounter (HOSPITAL_COMMUNITY)
Admission: RE | Admit: 2011-07-03 | Discharge: 2011-07-03 | Disposition: A | Discharge status: Home / Self Care | Payer: Medicaid Other | Source: Ambulatory Visit | Attending: Cardiovascular Disease | Admitting: Cardiovascular Disease

## 2011-07-03 ENCOUNTER — Ambulatory Visit (HOSPITAL_COMMUNITY)
Admission: RE | Admit: 2011-07-03 | Discharge: 2011-07-03 | Disposition: A | Discharge status: Home / Self Care | Payer: Medicaid Other | Source: Ambulatory Visit | Attending: Cardiovascular Disease | Admitting: Cardiovascular Disease

## 2011-07-03 DIAGNOSIS — R0602 Shortness of breath: Secondary | ICD-10-CM

## 2011-07-03 DIAGNOSIS — I10 Essential (primary) hypertension: Secondary | ICD-10-CM | POA: Insufficient documentation

## 2011-07-03 DIAGNOSIS — Z87891 Personal history of nicotine dependence: Secondary | ICD-10-CM | POA: Insufficient documentation

## 2011-07-04 NOTE — Progress Notes (Signed)
Pt c/o dyspnea on exertion, more than usual. Pt had to stop to catch his breath.  Dry cough, clear nasal congestion noted.  More than usual perspiration noted.  Pt denies sx prior to exercise.  O2 sat-92% ra.  Lungs course, bilateral rales left >right.  PC to Dr. Tresa Endo office, spoke to [facility].  New order given to obtain CXR-PA and LAT now.  Written order faxed by Dr. Tresa Endo.  Pt escorted to radiology via wheelchair.  Pt sent home to await advice from Dr. Tresa Endo.  Pt states he has appt to establish care with new PCP [facility] on 07/04/2011. Pt instructed to make PCP aware of sx and CXR done.  Present to ED for worsening unrelieved sx.  Understanding verbalized-jr,rn

## 2011-07-05 ENCOUNTER — Encounter (HOSPITAL_COMMUNITY)
Admission: RE | Admit: 2011-07-05 | Discharge: 2011-07-05 | Disposition: A | Discharge status: Home / Self Care | Payer: Medicaid Other | Source: Ambulatory Visit | Attending: Cardiovascular Disease | Admitting: Cardiovascular Disease

## 2011-07-05 NOTE — Progress Notes (Signed)
Pt arrived to cardiac rehab reporting medication changes by Dr. Tresa Endo, pt unsure of names and dosages of new medications, he has not picked them up from pharmacy.  Records requested from Dr. Tresa Endo.  Medication list with current and new medication orders reviewed with pt.  Pt states he is still taking medications that he did not report at Dr. Landry Dyke office visit, specifically spironalctone and simvastatin.  Pt is unsure if he is currently taking lisinopril.  Pt advised to contact Dr. Landry Dyke office with pill bottles in front of him for accuracy to confirm which meds he should continue.  Pt states he will call today.  Pt advised to bring medication bottles to cardiac rehab on next visit to reconcile medication list.  Understanding verbalized-jr,rn

## 2011-07-07 ENCOUNTER — Encounter (HOSPITAL_COMMUNITY)
Admission: RE | Admit: 2011-07-07 | Discharge: 2011-07-07 | Disposition: A | Discharge status: Home / Self Care | Payer: Medicaid Other | Source: Ambulatory Visit | Attending: Cardiovascular Disease | Admitting: Cardiovascular Disease

## 2011-07-10 ENCOUNTER — Encounter (HOSPITAL_COMMUNITY)
Admission: RE | Admit: 2011-07-10 | Discharge: 2011-07-10 | Disposition: A | Discharge status: Home / Self Care | Payer: Medicaid Other | Source: Ambulatory Visit | Attending: Cardiovascular Disease | Admitting: Cardiovascular Disease

## 2011-07-12 ENCOUNTER — Encounter (HOSPITAL_COMMUNITY)
Admission: RE | Admit: 2011-07-12 | Discharge: 2011-07-12 | Disposition: A | Discharge status: Home / Self Care | Payer: Medicaid Other | Source: Ambulatory Visit | Attending: Cardiovascular Disease | Admitting: Cardiovascular Disease

## 2011-07-14 ENCOUNTER — Encounter (HOSPITAL_COMMUNITY)
Admission: RE | Admit: 2011-07-14 | Discharge: 2011-07-14 | Disposition: A | Discharge status: Home / Self Care | Payer: Medicaid Other | Source: Ambulatory Visit | Attending: Cardiovascular Disease | Admitting: Cardiovascular Disease

## 2011-07-14 NOTE — Progress Notes (Signed)
Pt c/o dyspnea on exertion today while working on the [facility].  Dyspnea more than usual with exercise, however resolved with decreased workload and not present while walking the track.  O@sat -95% while dyspneic.  Pt weight up 1.4kg from Wednesday, however in keeping with his usual end of week weight.  Pt denies missed meds.  Dr. Landry Dyke office made aware of pt sx.  Pt advised to continue current regimen, taking caution to avoiding missed meds and follow strict low NA diet.  Call Dr. Landry Dyke office for worsening sx.  Understanding verbalized-jr,rn

## 2011-07-17 ENCOUNTER — Encounter (HOSPITAL_COMMUNITY)
Admission: RE | Admit: 2011-07-17 | Discharge: 2011-07-17 | Disposition: A | Discharge status: Home / Self Care | Payer: Medicaid Other | Source: Ambulatory Visit | Attending: Cardiovascular Disease | Admitting: Cardiovascular Disease

## 2011-07-19 ENCOUNTER — Encounter (HOSPITAL_COMMUNITY)
Admission: RE | Admit: 2011-07-19 | Discharge: 2011-07-19 | Disposition: A | Discharge status: Home / Self Care | Payer: Medicaid Other | Source: Ambulatory Visit | Attending: Cardiovascular Disease | Admitting: Cardiovascular Disease

## 2011-07-21 ENCOUNTER — Encounter (HOSPITAL_COMMUNITY): Payer: Medicaid Other

## 2011-07-24 ENCOUNTER — Encounter (HOSPITAL_COMMUNITY)
Admission: RE | Admit: 2011-07-24 | Discharge: 2011-07-24 | Disposition: A | Discharge status: Home / Self Care | Payer: Medicaid Other | Source: Ambulatory Visit | Attending: Cardiovascular Disease | Admitting: Cardiovascular Disease

## 2011-07-26 ENCOUNTER — Encounter (HOSPITAL_COMMUNITY)
Admission: RE | Admit: 2011-07-26 | Discharge: 2011-07-26 | Disposition: A | Discharge status: Home / Self Care | Payer: Medicaid Other | Source: Ambulatory Visit | Attending: Cardiovascular Disease | Admitting: Cardiovascular Disease

## 2011-07-28 ENCOUNTER — Encounter (HOSPITAL_COMMUNITY)
Admission: RE | Admit: 2011-07-28 | Discharge: 2011-07-28 | Disposition: A | Discharge status: Home / Self Care | Payer: Medicaid Other | Source: Ambulatory Visit | Attending: Cardiovascular Disease | Admitting: Cardiovascular Disease

## 2011-07-28 DIAGNOSIS — I252 Old myocardial infarction: Secondary | ICD-10-CM | POA: Insufficient documentation

## 2011-07-28 DIAGNOSIS — N183 Chronic kidney disease, stage 3 unspecified: Secondary | ICD-10-CM | POA: Insufficient documentation

## 2011-07-28 DIAGNOSIS — I509 Heart failure, unspecified: Secondary | ICD-10-CM | POA: Insufficient documentation

## 2011-07-28 DIAGNOSIS — I5032 Chronic diastolic (congestive) heart failure: Secondary | ICD-10-CM | POA: Insufficient documentation

## 2011-07-28 DIAGNOSIS — I2589 Other forms of chronic ischemic heart disease: Secondary | ICD-10-CM | POA: Insufficient documentation

## 2011-07-28 DIAGNOSIS — Z5189 Encounter for other specified aftercare: Secondary | ICD-10-CM | POA: Insufficient documentation

## 2011-07-28 DIAGNOSIS — I129 Hypertensive chronic kidney disease with stage 1 through stage 4 chronic kidney disease, or unspecified chronic kidney disease: Secondary | ICD-10-CM | POA: Insufficient documentation

## 2011-07-28 DIAGNOSIS — Z9861 Coronary angioplasty status: Secondary | ICD-10-CM | POA: Insufficient documentation

## 2011-07-31 ENCOUNTER — Encounter (HOSPITAL_COMMUNITY)
Admission: RE | Admit: 2011-07-31 | Discharge: 2011-07-31 | Disposition: A | Discharge status: Home / Self Care | Payer: Medicaid Other | Source: Ambulatory Visit | Attending: Cardiovascular Disease | Admitting: Cardiovascular Disease

## 2011-08-02 ENCOUNTER — Encounter (HOSPITAL_COMMUNITY)
Admission: RE | Admit: 2011-08-02 | Discharge: 2011-08-02 | Disposition: A | Discharge status: Home / Self Care | Payer: Medicaid Other | Source: Ambulatory Visit | Attending: Cardiovascular Disease | Admitting: Cardiovascular Disease

## 2011-08-04 ENCOUNTER — Encounter (HOSPITAL_COMMUNITY)
Admission: RE | Admit: 2011-08-04 | Discharge: 2011-08-04 | Disposition: A | Discharge status: Home / Self Care | Payer: Medicaid Other | Source: Ambulatory Visit | Attending: Cardiovascular Disease | Admitting: Cardiovascular Disease

## 2011-08-07 ENCOUNTER — Encounter (HOSPITAL_COMMUNITY)
Admission: RE | Admit: 2011-08-07 | Discharge: 2011-08-07 | Disposition: A | Discharge status: Home / Self Care | Payer: Medicaid Other | Source: Ambulatory Visit | Attending: Cardiovascular Disease | Admitting: Cardiovascular Disease

## 2011-08-07 NOTE — Progress Notes (Addendum)
Cardiac Rehabilitation Program Progress Report   Orientation:  04/27/2011 Graduate Date:  08/07/2011 Discharge Date:  # of sessions completed: 36  Cardiologist: Rubbie Battiest MD:   Class Time:  1315  A.  Exercise Program:  Tolerates exercise @ 3.7 METS for 30 minutes, No Change functional capacity  0 %, No Change  muscular strength  0 %, Improved  flexibility 13.04 % and Discharged to home exercise program.  Anticipated compliance:  fair  B.  Mental Health:  Quality of Life (QOL)  improvements:  Overall  9.63 %, Health/Functioning 5.26 %, Socioeconomics 10.42 %, Psych/Spiritual 27.25 %, Family 2.5 %    C.  Education/Instruction/Skills  Accurately checks own pulse. Knows THR for exercise, Uses Perceived Exertion Scale and Attended 10 education classes    D.  Nutrition/Weight Control/Body Composition:  % Body Fat  27.2 and Patient has gained 1.8 kg BMI 25.2  *This section completed by Mickle Plumb, Andres Shad, RD, LDN, CDE  E.  Blood Lipids    Lab Results  Component Value Date   CHOL  Value: 133        ATP III CLASSIFICATION:  <200     mg/dL   Desirable  562-130  mg/dL   Borderline High  >=865    mg/dL   High        7/84/6962     Lab Results  Component Value Date   TRIG 70 07/13/2010     Lab Results  Component Value Date   HDL 39* 07/13/2010     Lab Results  Component Value Date   CHOLHDL 3.4 07/13/2010     No results found for this basename: LDLDIRECT      F.  Lifestyle Changes:    G.  Symptoms noted with exercise:  Shortness of breath  Report Completed By:  Dayton Martes   Comments:  Electronically signed by Harriett Sine MS on Monday August 07 2011 at 1655      Pt successfully graduated from cardiac rehab completing 36/36 sessions from 05/01/2011-08/07/2011.  Pt had excellent participation in both exercise and education classes.  Pt VSS, telemetry-sinus rhythm with IVCD.  Occasional PVC.  Pt rarely symptomatic with exercise  however on 07/03/2011 experienced dyspnea on exertion associated with diaphoresis.  Pt was evaluated by Dr. Tresa Endo in his office for these symptoms.  Pt did not have hospital readmission during cardiac rehab period.  Pt made positive lifestyle changes and should be congratulated for his success.

## 2011-08-09 ENCOUNTER — Encounter (HOSPITAL_COMMUNITY): Payer: Medicaid Other

## 2011-08-11 ENCOUNTER — Encounter (HOSPITAL_COMMUNITY): Payer: Medicaid Other

## 2011-08-14 ENCOUNTER — Encounter (HOSPITAL_COMMUNITY): Payer: Medicaid Other

## 2011-08-16 ENCOUNTER — Encounter (HOSPITAL_COMMUNITY): Payer: Medicaid Other

## 2011-08-18 ENCOUNTER — Encounter (HOSPITAL_COMMUNITY): Payer: Medicaid Other

## 2011-08-21 ENCOUNTER — Encounter (HOSPITAL_COMMUNITY): Payer: Medicaid Other

## 2011-08-23 ENCOUNTER — Encounter (HOSPITAL_COMMUNITY): Payer: Medicaid Other

## 2011-08-23 NOTE — Progress Notes (Signed)
Addendum to Nutrition Section of Cardiac Rehab Program Progress Report  Pt following a step 2 Therapeutic Lifestyle Changes diet. Pt wt up 1.8 kg,  No decrease in % body fat noted.

## 2011-08-25 ENCOUNTER — Encounter (HOSPITAL_COMMUNITY): Payer: Medicaid Other

## 2011-08-28 ENCOUNTER — Encounter (HOSPITAL_COMMUNITY): Payer: Medicaid Other

## 2011-08-29 ENCOUNTER — Encounter (HOSPITAL_COMMUNITY): Payer: Self-pay | Admitting: Cardiac Rehabilitation

## 2011-08-30 ENCOUNTER — Encounter (HOSPITAL_COMMUNITY): Payer: Medicaid Other

## 2011-09-01 ENCOUNTER — Encounter (HOSPITAL_COMMUNITY): Payer: Medicaid Other

## 2011-09-11 ENCOUNTER — Encounter (HOSPITAL_COMMUNITY)
Admission: RE | Admit: 2011-09-11 | Discharge: 2011-09-11 | Disposition: A | Discharge status: Home / Self Care | Payer: Self-pay | Source: Ambulatory Visit | Attending: Cardiovascular Disease | Admitting: Cardiovascular Disease

## 2011-09-11 DIAGNOSIS — Z5189 Encounter for other specified aftercare: Secondary | ICD-10-CM | POA: Insufficient documentation

## 2011-09-11 DIAGNOSIS — I252 Old myocardial infarction: Secondary | ICD-10-CM | POA: Insufficient documentation

## 2011-09-11 DIAGNOSIS — N183 Chronic kidney disease, stage 3 unspecified: Secondary | ICD-10-CM | POA: Insufficient documentation

## 2011-09-11 DIAGNOSIS — I5032 Chronic diastolic (congestive) heart failure: Secondary | ICD-10-CM | POA: Insufficient documentation

## 2011-09-11 DIAGNOSIS — I129 Hypertensive chronic kidney disease with stage 1 through stage 4 chronic kidney disease, or unspecified chronic kidney disease: Secondary | ICD-10-CM | POA: Insufficient documentation

## 2011-09-11 DIAGNOSIS — I509 Heart failure, unspecified: Secondary | ICD-10-CM | POA: Insufficient documentation

## 2011-09-11 DIAGNOSIS — I2589 Other forms of chronic ischemic heart disease: Secondary | ICD-10-CM | POA: Insufficient documentation

## 2011-09-11 DIAGNOSIS — Z9861 Coronary angioplasty status: Secondary | ICD-10-CM | POA: Insufficient documentation

## 2011-09-13 ENCOUNTER — Encounter (HOSPITAL_COMMUNITY)
Admission: RE | Admit: 2011-09-13 | Discharge: 2011-09-13 | Disposition: A | Discharge status: Home / Self Care | Payer: Self-pay | Source: Ambulatory Visit | Attending: Cardiovascular Disease | Admitting: Cardiovascular Disease

## 2011-09-15 ENCOUNTER — Encounter (HOSPITAL_COMMUNITY)
Admission: RE | Admit: 2011-09-15 | Discharge: 2011-09-15 | Disposition: A | Discharge status: Home / Self Care | Payer: Self-pay | Source: Ambulatory Visit | Attending: Cardiovascular Disease | Admitting: Cardiovascular Disease

## 2011-09-18 ENCOUNTER — Encounter (HOSPITAL_COMMUNITY)
Admission: RE | Admit: 2011-09-18 | Discharge: 2011-09-18 | Disposition: A | Discharge status: Home / Self Care | Payer: Self-pay | Source: Ambulatory Visit | Attending: Cardiovascular Disease | Admitting: Cardiovascular Disease

## 2011-09-20 ENCOUNTER — Encounter (HOSPITAL_COMMUNITY)
Admission: RE | Admit: 2011-09-20 | Discharge: 2011-09-20 | Disposition: A | Discharge status: Home / Self Care | Payer: Self-pay | Source: Ambulatory Visit | Attending: Cardiovascular Disease | Admitting: Cardiovascular Disease

## 2011-09-22 ENCOUNTER — Encounter (HOSPITAL_COMMUNITY)
Admission: RE | Admit: 2011-09-22 | Discharge: 2011-09-22 | Disposition: A | Discharge status: Home / Self Care | Payer: Self-pay | Source: Ambulatory Visit | Attending: Cardiovascular Disease | Admitting: Cardiovascular Disease

## 2011-09-22 NOTE — Progress Notes (Signed)
Patient's weight was up 2.3 kg today from Wednesday. Pt denies SOB, denies edema. Will fax pt's exercise log to Dr. Tresa Endo for review.

## 2011-09-25 ENCOUNTER — Encounter (HOSPITAL_COMMUNITY)
Admission: RE | Admit: 2011-09-25 | Discharge: 2011-09-25 | Disposition: A | Discharge status: Home / Self Care | Payer: Self-pay | Source: Ambulatory Visit | Attending: Cardiovascular Disease | Admitting: Cardiovascular Disease

## 2011-09-25 DIAGNOSIS — I129 Hypertensive chronic kidney disease with stage 1 through stage 4 chronic kidney disease, or unspecified chronic kidney disease: Secondary | ICD-10-CM | POA: Insufficient documentation

## 2011-09-25 DIAGNOSIS — Z5189 Encounter for other specified aftercare: Secondary | ICD-10-CM | POA: Insufficient documentation

## 2011-09-25 DIAGNOSIS — I509 Heart failure, unspecified: Secondary | ICD-10-CM | POA: Insufficient documentation

## 2011-09-25 DIAGNOSIS — I5032 Chronic diastolic (congestive) heart failure: Secondary | ICD-10-CM | POA: Insufficient documentation

## 2011-09-25 DIAGNOSIS — I252 Old myocardial infarction: Secondary | ICD-10-CM | POA: Insufficient documentation

## 2011-09-25 DIAGNOSIS — N183 Chronic kidney disease, stage 3 unspecified: Secondary | ICD-10-CM | POA: Insufficient documentation

## 2011-09-25 DIAGNOSIS — I2589 Other forms of chronic ischemic heart disease: Secondary | ICD-10-CM | POA: Insufficient documentation

## 2011-09-25 DIAGNOSIS — Z9861 Coronary angioplasty status: Secondary | ICD-10-CM | POA: Insufficient documentation

## 2011-09-27 ENCOUNTER — Encounter (HOSPITAL_COMMUNITY)
Admission: RE | Admit: 2011-09-27 | Discharge: 2011-09-27 | Disposition: A | Discharge status: Home / Self Care | Payer: Self-pay | Source: Ambulatory Visit | Attending: Cardiovascular Disease | Admitting: Cardiovascular Disease

## 2011-09-29 ENCOUNTER — Encounter (HOSPITAL_COMMUNITY)
Admission: RE | Admit: 2011-09-29 | Discharge: 2011-09-29 | Disposition: A | Discharge status: Home / Self Care | Payer: Self-pay | Source: Ambulatory Visit | Attending: Cardiovascular Disease | Admitting: Cardiovascular Disease

## 2011-10-02 ENCOUNTER — Encounter (HOSPITAL_COMMUNITY)
Admission: RE | Admit: 2011-10-02 | Discharge: 2011-10-02 | Disposition: A | Discharge status: Home / Self Care | Payer: Self-pay | Source: Ambulatory Visit | Attending: Cardiovascular Disease | Admitting: Cardiovascular Disease

## 2011-10-04 ENCOUNTER — Encounter (HOSPITAL_COMMUNITY)
Admission: RE | Admit: 2011-10-04 | Discharge: 2011-10-04 | Disposition: A | Discharge status: Home / Self Care | Payer: Self-pay | Source: Ambulatory Visit | Attending: Cardiovascular Disease | Admitting: Cardiovascular Disease

## 2011-10-06 ENCOUNTER — Encounter (HOSPITAL_COMMUNITY)
Admission: RE | Admit: 2011-10-06 | Discharge: 2011-10-06 | Disposition: A | Discharge status: Home / Self Care | Payer: Self-pay | Source: Ambulatory Visit | Attending: Cardiovascular Disease | Admitting: Cardiovascular Disease

## 2011-10-09 ENCOUNTER — Encounter (HOSPITAL_COMMUNITY)
Admission: RE | Admit: 2011-10-09 | Discharge: 2011-10-09 | Disposition: A | Discharge status: Home / Self Care | Payer: Self-pay | Source: Ambulatory Visit | Attending: Cardiovascular Disease | Admitting: Cardiovascular Disease

## 2011-10-11 ENCOUNTER — Encounter (HOSPITAL_COMMUNITY)
Admission: RE | Admit: 2011-10-11 | Discharge: 2011-10-11 | Disposition: A | Discharge status: Home / Self Care | Payer: Self-pay | Source: Ambulatory Visit | Attending: Cardiovascular Disease | Admitting: Cardiovascular Disease

## 2011-10-13 ENCOUNTER — Encounter (HOSPITAL_COMMUNITY)
Admission: RE | Admit: 2011-10-13 | Discharge: 2011-10-13 | Disposition: A | Discharge status: Home / Self Care | Payer: Self-pay | Source: Ambulatory Visit | Attending: Cardiovascular Disease | Admitting: Cardiovascular Disease

## 2011-10-16 ENCOUNTER — Encounter (HOSPITAL_COMMUNITY)
Admission: RE | Admit: 2011-10-16 | Discharge: 2011-10-16 | Disposition: A | Discharge status: Home / Self Care | Payer: Self-pay | Source: Ambulatory Visit | Attending: Cardiovascular Disease | Admitting: Cardiovascular Disease

## 2011-10-18 ENCOUNTER — Encounter (HOSPITAL_COMMUNITY)
Admission: RE | Admit: 2011-10-18 | Discharge: 2011-10-18 | Disposition: A | Discharge status: Home / Self Care | Payer: Self-pay | Source: Ambulatory Visit | Attending: Cardiovascular Disease | Admitting: Cardiovascular Disease

## 2011-10-20 ENCOUNTER — Encounter (HOSPITAL_COMMUNITY)
Admission: RE | Admit: 2011-10-20 | Discharge: 2011-10-20 | Disposition: A | Discharge status: Home / Self Care | Payer: Self-pay | Source: Ambulatory Visit | Attending: Cardiovascular Disease | Admitting: Cardiovascular Disease

## 2011-10-23 ENCOUNTER — Encounter (HOSPITAL_COMMUNITY)
Admission: RE | Admit: 2011-10-23 | Discharge: 2011-10-23 | Disposition: A | Discharge status: Home / Self Care | Payer: Self-pay | Source: Ambulatory Visit | Attending: Cardiovascular Disease | Admitting: Cardiovascular Disease

## 2011-10-25 ENCOUNTER — Encounter (HOSPITAL_COMMUNITY)
Admission: RE | Admit: 2011-10-25 | Discharge: 2011-10-25 | Disposition: A | Discharge status: Home / Self Care | Payer: Self-pay | Source: Ambulatory Visit | Attending: Cardiovascular Disease | Admitting: Cardiovascular Disease

## 2011-10-25 DIAGNOSIS — N183 Chronic kidney disease, stage 3 unspecified: Secondary | ICD-10-CM | POA: Insufficient documentation

## 2011-10-25 DIAGNOSIS — I509 Heart failure, unspecified: Secondary | ICD-10-CM | POA: Insufficient documentation

## 2011-10-25 DIAGNOSIS — I2589 Other forms of chronic ischemic heart disease: Secondary | ICD-10-CM | POA: Insufficient documentation

## 2011-10-25 DIAGNOSIS — Z9861 Coronary angioplasty status: Secondary | ICD-10-CM | POA: Insufficient documentation

## 2011-10-25 DIAGNOSIS — I5032 Chronic diastolic (congestive) heart failure: Secondary | ICD-10-CM | POA: Insufficient documentation

## 2011-10-25 DIAGNOSIS — I129 Hypertensive chronic kidney disease with stage 1 through stage 4 chronic kidney disease, or unspecified chronic kidney disease: Secondary | ICD-10-CM | POA: Insufficient documentation

## 2011-10-25 DIAGNOSIS — Z5189 Encounter for other specified aftercare: Secondary | ICD-10-CM | POA: Insufficient documentation

## 2011-10-25 DIAGNOSIS — I252 Old myocardial infarction: Secondary | ICD-10-CM | POA: Insufficient documentation

## 2011-10-27 ENCOUNTER — Encounter (HOSPITAL_COMMUNITY)
Admission: RE | Admit: 2011-10-27 | Discharge: 2011-10-27 | Disposition: A | Discharge status: Home / Self Care | Payer: Self-pay | Source: Ambulatory Visit | Attending: Cardiovascular Disease | Admitting: Cardiovascular Disease

## 2011-10-30 ENCOUNTER — Encounter (HOSPITAL_COMMUNITY): Payer: Self-pay

## 2011-11-01 ENCOUNTER — Encounter (HOSPITAL_COMMUNITY): Admission: RE | Admit: 2011-11-01 | Payer: Self-pay | Source: Ambulatory Visit

## 2011-11-03 ENCOUNTER — Encounter (HOSPITAL_COMMUNITY): Payer: Self-pay

## 2011-11-06 ENCOUNTER — Encounter (HOSPITAL_COMMUNITY)
Admission: RE | Admit: 2011-11-06 | Discharge: 2011-11-06 | Disposition: A | Discharge status: Home / Self Care | Payer: Self-pay | Source: Ambulatory Visit | Attending: Cardiovascular Disease | Admitting: Cardiovascular Disease

## 2011-11-08 ENCOUNTER — Encounter (HOSPITAL_COMMUNITY)
Admission: RE | Admit: 2011-11-08 | Discharge: 2011-11-08 | Disposition: A | Discharge status: Home / Self Care | Payer: Self-pay | Source: Ambulatory Visit | Attending: Cardiovascular Disease | Admitting: Cardiovascular Disease

## 2011-11-10 ENCOUNTER — Encounter (HOSPITAL_COMMUNITY)
Admission: RE | Admit: 2011-11-10 | Discharge: 2011-11-10 | Disposition: A | Discharge status: Home / Self Care | Payer: Self-pay | Source: Ambulatory Visit | Attending: Cardiovascular Disease | Admitting: Cardiovascular Disease

## 2011-11-13 ENCOUNTER — Encounter (HOSPITAL_COMMUNITY)
Admission: RE | Admit: 2011-11-13 | Discharge: 2011-11-13 | Disposition: A | Discharge status: Home / Self Care | Payer: Self-pay | Source: Ambulatory Visit | Attending: Cardiovascular Disease | Admitting: Cardiovascular Disease

## 2011-11-15 ENCOUNTER — Encounter (HOSPITAL_COMMUNITY)
Admission: RE | Admit: 2011-11-15 | Discharge: 2011-11-15 | Disposition: A | Discharge status: Home / Self Care | Payer: Self-pay | Source: Ambulatory Visit | Attending: Cardiovascular Disease | Admitting: Cardiovascular Disease

## 2011-11-17 ENCOUNTER — Encounter (HOSPITAL_COMMUNITY): Payer: Self-pay

## 2011-11-20 ENCOUNTER — Encounter (HOSPITAL_COMMUNITY): Payer: Self-pay

## 2011-11-22 ENCOUNTER — Encounter (HOSPITAL_COMMUNITY)
Admission: RE | Admit: 2011-11-22 | Discharge: 2011-11-22 | Disposition: A | Discharge status: Home / Self Care | Payer: Self-pay | Source: Ambulatory Visit | Attending: Cardiovascular Disease | Admitting: Cardiovascular Disease

## 2011-11-24 ENCOUNTER — Encounter (HOSPITAL_COMMUNITY)
Admission: RE | Admit: 2011-11-24 | Discharge: 2011-11-24 | Disposition: A | Discharge status: Home / Self Care | Payer: Self-pay | Source: Ambulatory Visit | Attending: Cardiovascular Disease | Admitting: Cardiovascular Disease

## 2011-11-27 ENCOUNTER — Encounter (HOSPITAL_COMMUNITY): Payer: Self-pay

## 2011-11-27 DIAGNOSIS — N183 Chronic kidney disease, stage 3 unspecified: Secondary | ICD-10-CM | POA: Insufficient documentation

## 2011-11-27 DIAGNOSIS — I129 Hypertensive chronic kidney disease with stage 1 through stage 4 chronic kidney disease, or unspecified chronic kidney disease: Secondary | ICD-10-CM | POA: Insufficient documentation

## 2011-11-27 DIAGNOSIS — Z9861 Coronary angioplasty status: Secondary | ICD-10-CM | POA: Insufficient documentation

## 2011-11-27 DIAGNOSIS — I5032 Chronic diastolic (congestive) heart failure: Secondary | ICD-10-CM | POA: Insufficient documentation

## 2011-11-27 DIAGNOSIS — Z5189 Encounter for other specified aftercare: Secondary | ICD-10-CM | POA: Insufficient documentation

## 2011-11-27 DIAGNOSIS — I252 Old myocardial infarction: Secondary | ICD-10-CM | POA: Insufficient documentation

## 2011-11-27 DIAGNOSIS — I2589 Other forms of chronic ischemic heart disease: Secondary | ICD-10-CM | POA: Insufficient documentation

## 2011-11-27 DIAGNOSIS — I509 Heart failure, unspecified: Secondary | ICD-10-CM | POA: Insufficient documentation

## 2011-11-29 ENCOUNTER — Encounter (HOSPITAL_COMMUNITY)
Admission: RE | Admit: 2011-11-29 | Discharge: 2011-11-29 | Disposition: A | Discharge status: Home / Self Care | Payer: Self-pay | Source: Ambulatory Visit | Attending: Cardiovascular Disease | Admitting: Cardiovascular Disease

## 2011-12-01 ENCOUNTER — Encounter (HOSPITAL_COMMUNITY)
Admission: RE | Admit: 2011-12-01 | Discharge: 2011-12-01 | Disposition: A | Discharge status: Home / Self Care | Payer: Self-pay | Source: Ambulatory Visit | Attending: Cardiovascular Disease | Admitting: Cardiovascular Disease

## 2011-12-04 ENCOUNTER — Encounter (HOSPITAL_COMMUNITY)
Admission: RE | Admit: 2011-12-04 | Discharge: 2011-12-04 | Disposition: A | Discharge status: Home / Self Care | Payer: Self-pay | Source: Ambulatory Visit | Attending: Cardiovascular Disease | Admitting: Cardiovascular Disease

## 2011-12-06 ENCOUNTER — Encounter (HOSPITAL_COMMUNITY)
Admission: RE | Admit: 2011-12-06 | Discharge: 2011-12-06 | Disposition: A | Discharge status: Home / Self Care | Payer: Self-pay | Source: Ambulatory Visit | Attending: Cardiovascular Disease | Admitting: Cardiovascular Disease

## 2011-12-08 ENCOUNTER — Encounter (HOSPITAL_COMMUNITY)
Admission: RE | Admit: 2011-12-08 | Discharge: 2011-12-08 | Disposition: A | Discharge status: Home / Self Care | Payer: Self-pay | Source: Ambulatory Visit | Attending: Cardiovascular Disease | Admitting: Cardiovascular Disease

## 2011-12-11 ENCOUNTER — Encounter (HOSPITAL_COMMUNITY)
Admission: RE | Admit: 2011-12-11 | Discharge: 2011-12-11 | Disposition: A | Discharge status: Home / Self Care | Payer: Self-pay | Source: Ambulatory Visit | Attending: Cardiovascular Disease | Admitting: Cardiovascular Disease

## 2011-12-13 ENCOUNTER — Encounter (HOSPITAL_COMMUNITY)
Admission: RE | Admit: 2011-12-13 | Discharge: 2011-12-13 | Disposition: A | Discharge status: Home / Self Care | Payer: Self-pay | Source: Ambulatory Visit | Attending: Cardiovascular Disease | Admitting: Cardiovascular Disease

## 2011-12-15 ENCOUNTER — Encounter (HOSPITAL_COMMUNITY)
Admission: RE | Admit: 2011-12-15 | Discharge: 2011-12-15 | Disposition: A | Discharge status: Home / Self Care | Payer: Self-pay | Source: Ambulatory Visit | Attending: Cardiovascular Disease | Admitting: Cardiovascular Disease

## 2011-12-18 ENCOUNTER — Encounter (HOSPITAL_COMMUNITY)
Admission: RE | Admit: 2011-12-18 | Discharge: 2011-12-18 | Disposition: A | Discharge status: Home / Self Care | Payer: Self-pay | Source: Ambulatory Visit | Attending: Cardiovascular Disease | Admitting: Cardiovascular Disease

## 2011-12-19 ENCOUNTER — Ambulatory Visit: Payer: Self-pay | Admitting: Emergency Medicine

## 2011-12-19 LAB — BASIC METABOLIC PANEL
Anion Gap: 6 — ABNORMAL LOW (ref 7–16)
BUN: 13 mg/dL (ref 7–18)
Calcium, Total: 8.2 mg/dL — ABNORMAL LOW (ref 8.5–10.1)
Chloride: 105 mmol/L (ref 98–107)
Co2: 27 mmol/L (ref 21–32)
Creatinine: 1.71 mg/dL — ABNORMAL HIGH (ref 0.60–1.30)
EGFR (African American): 51 — ABNORMAL LOW
Glucose: 96 mg/dL (ref 65–99)
Osmolality: 276 (ref 275–301)
Potassium: 3.7 mmol/L (ref 3.5–5.1)

## 2011-12-19 LAB — CBC WITH DIFFERENTIAL/PLATELET
Basophil #: 0.1 10*3/uL (ref 0.0–0.1)
Basophil %: 0.9 %
Eosinophil #: 1 10*3/uL — ABNORMAL HIGH (ref 0.0–0.7)
HGB: 14.7 g/dL (ref 13.0–18.0)
Lymphocyte %: 28.8 %
MCH: 30.8 pg (ref 26.0–34.0)
MCV: 92 fL (ref 80–100)
Monocyte #: 1 x10 3/mm (ref 0.2–1.0)
Monocyte %: 16.4 %
Neutrophil #: 2.4 10*3/uL (ref 1.4–6.5)

## 2011-12-20 ENCOUNTER — Encounter (HOSPITAL_COMMUNITY)
Admission: RE | Admit: 2011-12-20 | Discharge: 2011-12-20 | Disposition: A | Discharge status: Home / Self Care | Payer: Self-pay | Source: Ambulatory Visit | Attending: Cardiovascular Disease | Admitting: Cardiovascular Disease

## 2011-12-22 ENCOUNTER — Encounter (HOSPITAL_COMMUNITY)
Admission: RE | Admit: 2011-12-22 | Discharge: 2011-12-22 | Disposition: A | Discharge status: Home / Self Care | Payer: Self-pay | Source: Ambulatory Visit | Attending: Cardiovascular Disease | Admitting: Cardiovascular Disease

## 2011-12-25 ENCOUNTER — Encounter (HOSPITAL_COMMUNITY)
Admission: RE | Admit: 2011-12-25 | Discharge: 2011-12-25 | Disposition: A | Discharge status: Home / Self Care | Payer: Self-pay | Source: Ambulatory Visit | Attending: Cardiovascular Disease | Admitting: Cardiovascular Disease

## 2011-12-25 DIAGNOSIS — I2589 Other forms of chronic ischemic heart disease: Secondary | ICD-10-CM | POA: Insufficient documentation

## 2011-12-25 DIAGNOSIS — I509 Heart failure, unspecified: Secondary | ICD-10-CM | POA: Insufficient documentation

## 2011-12-25 DIAGNOSIS — I252 Old myocardial infarction: Secondary | ICD-10-CM | POA: Insufficient documentation

## 2011-12-25 DIAGNOSIS — I129 Hypertensive chronic kidney disease with stage 1 through stage 4 chronic kidney disease, or unspecified chronic kidney disease: Secondary | ICD-10-CM | POA: Insufficient documentation

## 2011-12-25 DIAGNOSIS — I5032 Chronic diastolic (congestive) heart failure: Secondary | ICD-10-CM | POA: Insufficient documentation

## 2011-12-25 DIAGNOSIS — Z9861 Coronary angioplasty status: Secondary | ICD-10-CM | POA: Insufficient documentation

## 2011-12-25 DIAGNOSIS — Z5189 Encounter for other specified aftercare: Secondary | ICD-10-CM | POA: Insufficient documentation

## 2011-12-25 DIAGNOSIS — N183 Chronic kidney disease, stage 3 unspecified: Secondary | ICD-10-CM | POA: Insufficient documentation

## 2011-12-26 ENCOUNTER — Ambulatory Visit: Payer: Self-pay | Admitting: Emergency Medicine

## 2011-12-27 ENCOUNTER — Encounter (HOSPITAL_COMMUNITY): Payer: Self-pay

## 2011-12-29 ENCOUNTER — Encounter (HOSPITAL_COMMUNITY): Payer: Self-pay

## 2011-12-29 LAB — PATHOLOGY REPORT

## 2012-01-01 ENCOUNTER — Encounter (HOSPITAL_COMMUNITY): Payer: Self-pay

## 2012-01-03 ENCOUNTER — Encounter (HOSPITAL_COMMUNITY): Payer: Self-pay

## 2012-01-05 ENCOUNTER — Encounter (HOSPITAL_COMMUNITY): Payer: Self-pay

## 2012-01-08 ENCOUNTER — Encounter (HOSPITAL_COMMUNITY): Payer: Self-pay

## 2012-01-10 ENCOUNTER — Encounter (HOSPITAL_COMMUNITY): Payer: Self-pay

## 2012-01-12 ENCOUNTER — Encounter (HOSPITAL_COMMUNITY): Payer: Self-pay

## 2012-01-15 ENCOUNTER — Encounter (HOSPITAL_COMMUNITY): Payer: Self-pay

## 2012-01-17 ENCOUNTER — Encounter (HOSPITAL_COMMUNITY): Payer: Self-pay

## 2012-01-17 IMAGING — CR DG CHEST 2V
2 series · 2 of 2 positions shown · non-contrast
Comparison: Multiple priors, most recently 03/03/2011

CLINICAL DATA: Shortness of breath.  Ex-smoker.  Hypertension.

CHEST - 2 VIEW

[w chest pa]
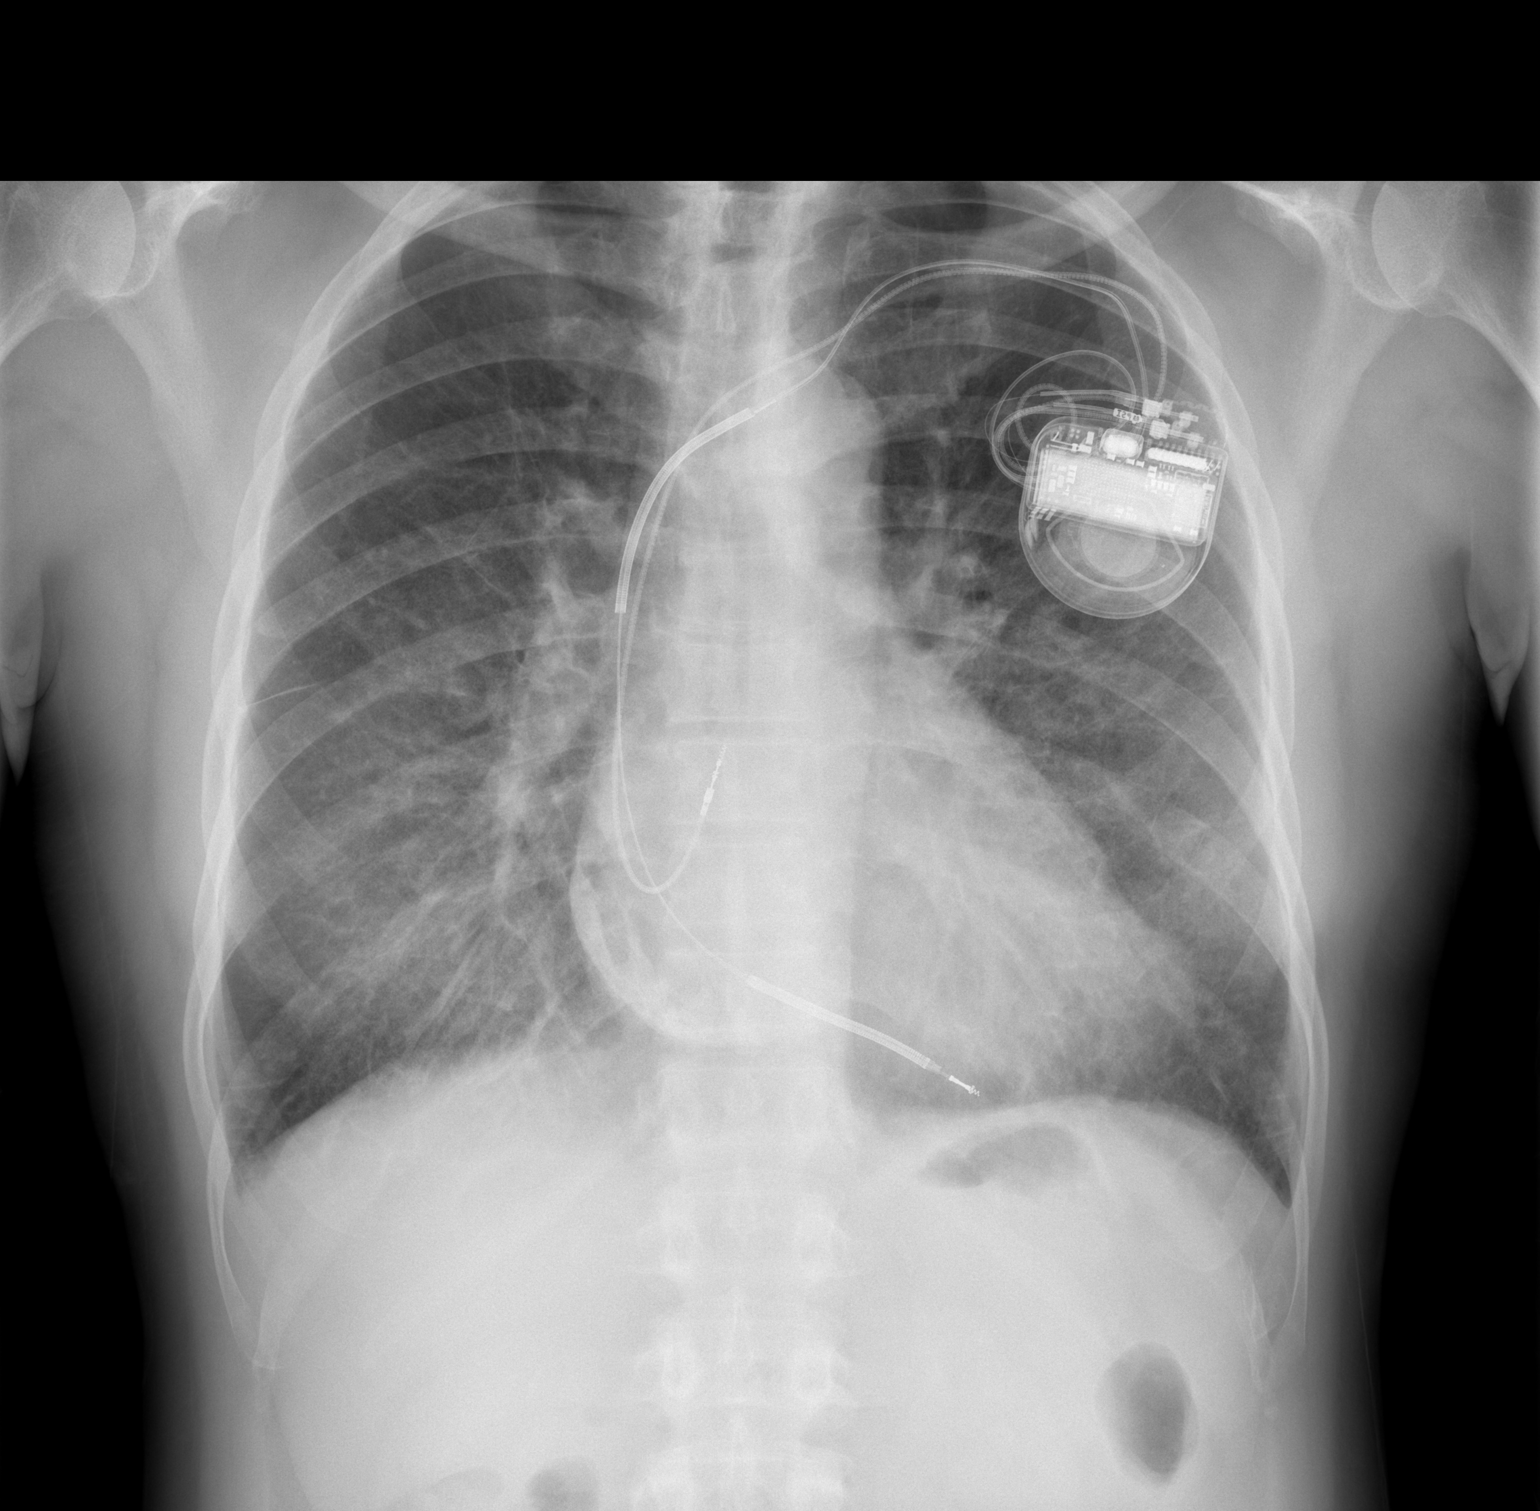

[w chest lat]
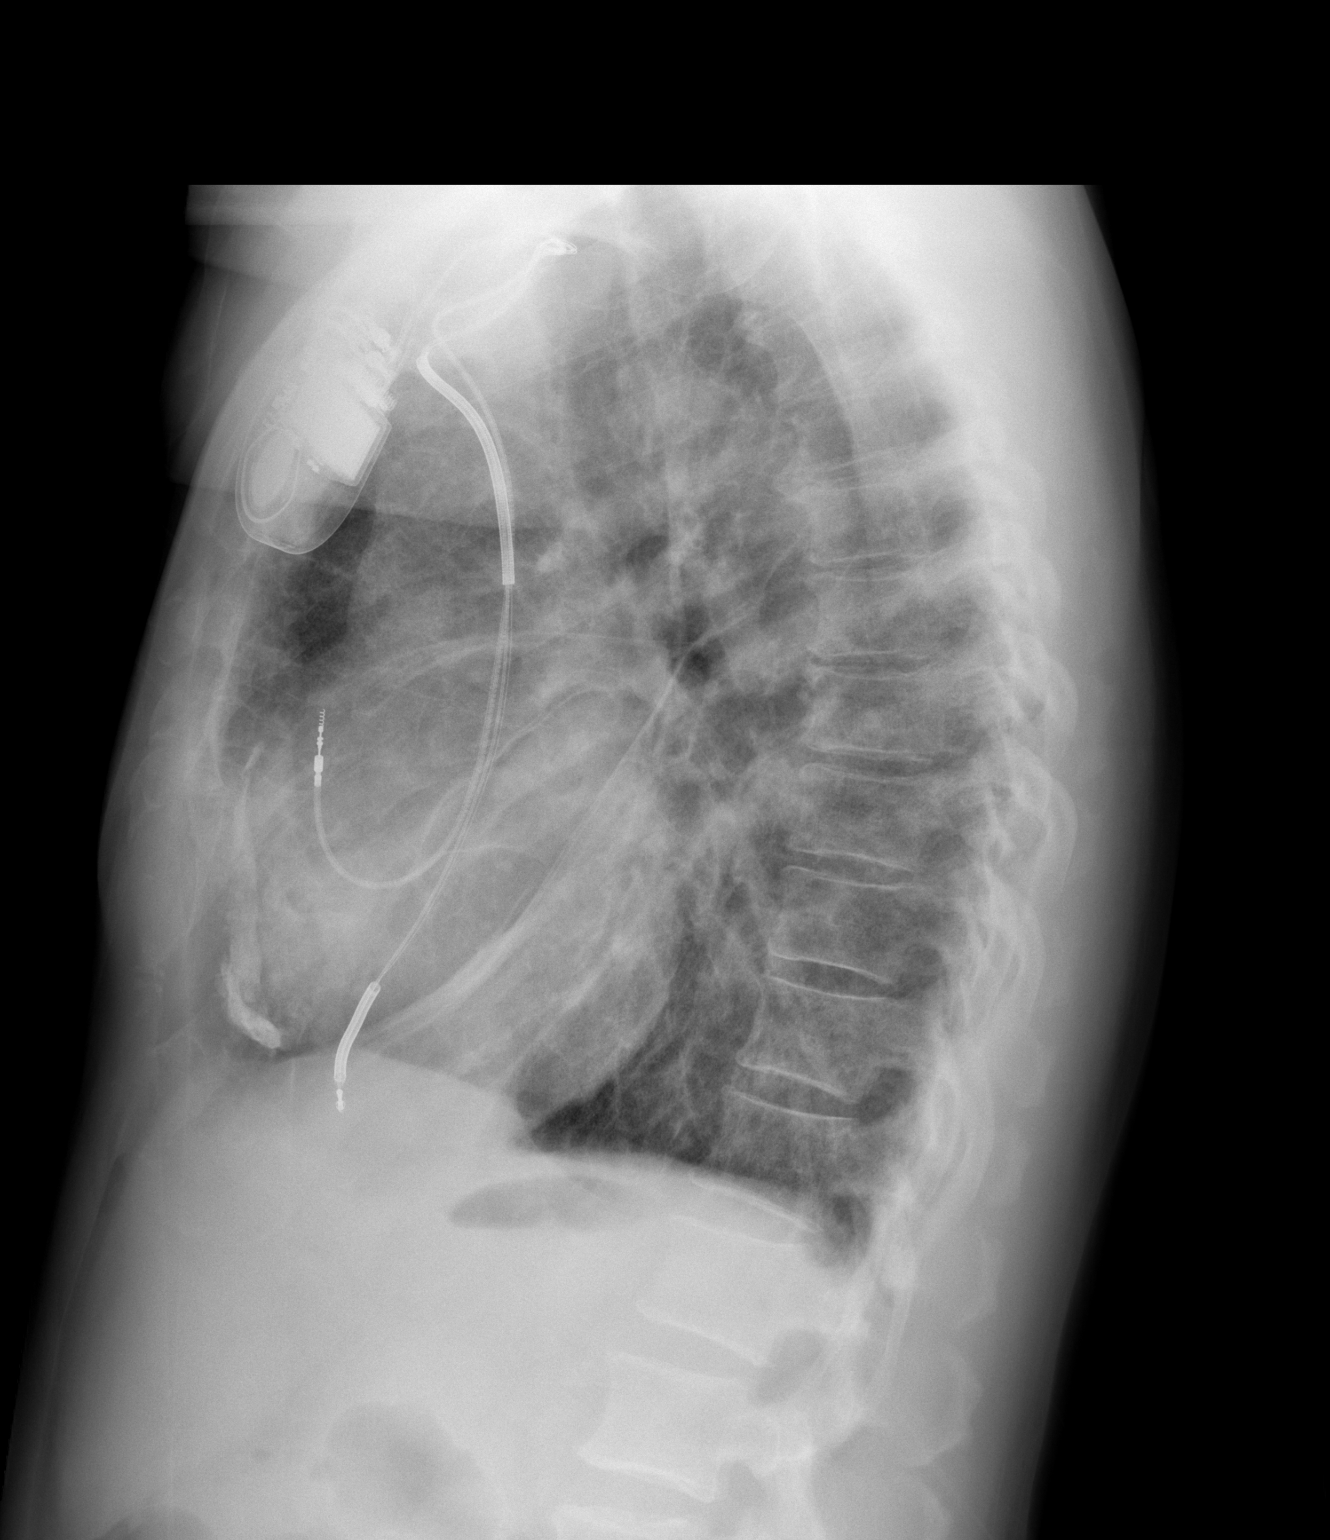

[2 of 2 positions shown; findings below may reference images not displayed]

FINDINGS: A dual lead pacemaker / AICD is in place with leads
projecting over the expected location of the right atrium and the
right ventricular apex.

Lung volumes are normal. Mild cephalization of the pulmonary
vasculature is noted with diffuse indistinctness of the
interstitial markings, multi focal Kerley B lines throughout the
mid and lower lungs, and thickening of the minor fissure, all
suggestive of mild pulmonary edema.  Heart size is upper limits of
normal.  However, there are profound curvilinear calcifications
adjacent to the right heart border on the frontal projection, and
anterior to the heart on the lateral projection, consistent with
severe pericardial calcifications.  Mediastinal contours are
otherwise unremarkable.
IMPRESSION: 1. The overall appearance is most suggestive of pulmonary edema.
While the heart size is not enlarged, there are profound
pericardial calcifications.  Overall, the appearance could suggest
underlying constrictive pericarditis, and clinical correlation is
recommended.  Other differential considerations would include
atypical infection, noncardiogenic edema, or less likely diffuse
alveolar hemorrhage.  These findings were discussed by phone with

## 2012-01-19 ENCOUNTER — Encounter (HOSPITAL_COMMUNITY): Admission: RE | Admit: 2012-01-19 | Payer: Self-pay | Source: Ambulatory Visit

## 2012-01-19 ENCOUNTER — Telehealth (HOSPITAL_COMMUNITY): Payer: Self-pay | Admitting: *Deleted

## 2012-01-22 ENCOUNTER — Encounter (HOSPITAL_COMMUNITY): Payer: Self-pay

## 2012-01-24 ENCOUNTER — Encounter (HOSPITAL_COMMUNITY)
Admission: RE | Admit: 2012-01-24 | Discharge: 2012-01-24 | Disposition: A | Discharge status: Home / Self Care | Payer: Self-pay | Source: Ambulatory Visit | Attending: Cardiovascular Disease | Admitting: Cardiovascular Disease

## 2012-01-26 ENCOUNTER — Encounter (HOSPITAL_COMMUNITY)
Admission: RE | Admit: 2012-01-26 | Discharge: 2012-01-26 | Disposition: A | Discharge status: Home / Self Care | Payer: Self-pay | Source: Ambulatory Visit | Attending: Cardiovascular Disease | Admitting: Cardiovascular Disease

## 2012-01-26 DIAGNOSIS — N183 Chronic kidney disease, stage 3 unspecified: Secondary | ICD-10-CM | POA: Insufficient documentation

## 2012-01-26 DIAGNOSIS — Z9861 Coronary angioplasty status: Secondary | ICD-10-CM | POA: Insufficient documentation

## 2012-01-26 DIAGNOSIS — I129 Hypertensive chronic kidney disease with stage 1 through stage 4 chronic kidney disease, or unspecified chronic kidney disease: Secondary | ICD-10-CM | POA: Insufficient documentation

## 2012-01-26 DIAGNOSIS — I5032 Chronic diastolic (congestive) heart failure: Secondary | ICD-10-CM | POA: Insufficient documentation

## 2012-01-26 DIAGNOSIS — I509 Heart failure, unspecified: Secondary | ICD-10-CM | POA: Insufficient documentation

## 2012-01-26 DIAGNOSIS — Z5189 Encounter for other specified aftercare: Secondary | ICD-10-CM | POA: Insufficient documentation

## 2012-01-26 DIAGNOSIS — I252 Old myocardial infarction: Secondary | ICD-10-CM | POA: Insufficient documentation

## 2012-01-26 DIAGNOSIS — I2589 Other forms of chronic ischemic heart disease: Secondary | ICD-10-CM | POA: Insufficient documentation

## 2012-01-29 ENCOUNTER — Encounter (HOSPITAL_COMMUNITY)
Admission: RE | Admit: 2012-01-29 | Discharge: 2012-01-29 | Disposition: A | Discharge status: Home / Self Care | Payer: Self-pay | Source: Ambulatory Visit | Attending: Cardiovascular Disease | Admitting: Cardiovascular Disease

## 2012-01-31 ENCOUNTER — Encounter (HOSPITAL_COMMUNITY)
Admission: RE | Admit: 2012-01-31 | Discharge: 2012-01-31 | Disposition: A | Discharge status: Home / Self Care | Payer: Self-pay | Source: Ambulatory Visit | Attending: Cardiovascular Disease | Admitting: Cardiovascular Disease

## 2012-02-02 ENCOUNTER — Encounter (HOSPITAL_COMMUNITY)
Admission: RE | Admit: 2012-02-02 | Discharge: 2012-02-02 | Disposition: A | Discharge status: Home / Self Care | Payer: Self-pay | Source: Ambulatory Visit | Attending: Cardiovascular Disease | Admitting: Cardiovascular Disease

## 2012-02-05 ENCOUNTER — Encounter (HOSPITAL_COMMUNITY)
Admission: RE | Admit: 2012-02-05 | Discharge: 2012-02-05 | Disposition: A | Discharge status: Home / Self Care | Payer: Self-pay | Source: Ambulatory Visit | Attending: Cardiovascular Disease | Admitting: Cardiovascular Disease

## 2012-02-07 ENCOUNTER — Encounter (HOSPITAL_COMMUNITY)
Admission: RE | Admit: 2012-02-07 | Discharge: 2012-02-07 | Disposition: A | Discharge status: Home / Self Care | Payer: Self-pay | Source: Ambulatory Visit | Attending: Cardiovascular Disease | Admitting: Cardiovascular Disease

## 2012-02-09 ENCOUNTER — Encounter (HOSPITAL_COMMUNITY): Payer: Self-pay

## 2012-02-12 ENCOUNTER — Encounter (HOSPITAL_COMMUNITY)
Admission: RE | Admit: 2012-02-12 | Discharge: 2012-02-12 | Disposition: A | Discharge status: Home / Self Care | Payer: Self-pay | Source: Ambulatory Visit | Attending: Cardiovascular Disease | Admitting: Cardiovascular Disease

## 2012-02-14 ENCOUNTER — Encounter (HOSPITAL_COMMUNITY)
Admission: RE | Admit: 2012-02-14 | Discharge: 2012-02-14 | Disposition: A | Discharge status: Home / Self Care | Payer: Self-pay | Source: Ambulatory Visit | Attending: Cardiovascular Disease | Admitting: Cardiovascular Disease

## 2012-02-16 ENCOUNTER — Encounter (HOSPITAL_COMMUNITY)
Admission: RE | Admit: 2012-02-16 | Discharge: 2012-02-16 | Disposition: A | Discharge status: Home / Self Care | Payer: Self-pay | Source: Ambulatory Visit | Attending: Cardiovascular Disease | Admitting: Cardiovascular Disease

## 2012-02-19 ENCOUNTER — Encounter (HOSPITAL_COMMUNITY): Payer: Self-pay

## 2012-02-21 ENCOUNTER — Encounter (HOSPITAL_COMMUNITY)
Admission: RE | Admit: 2012-02-21 | Discharge: 2012-02-21 | Disposition: A | Discharge status: Home / Self Care | Payer: Self-pay | Source: Ambulatory Visit | Attending: Cardiovascular Disease | Admitting: Cardiovascular Disease

## 2012-02-23 ENCOUNTER — Encounter (HOSPITAL_COMMUNITY)
Admission: RE | Admit: 2012-02-23 | Discharge: 2012-02-23 | Disposition: A | Discharge status: Home / Self Care | Payer: Self-pay | Source: Ambulatory Visit | Attending: Cardiovascular Disease | Admitting: Cardiovascular Disease

## 2012-04-03 ENCOUNTER — Encounter (HOSPITAL_COMMUNITY): Payer: Self-pay

## 2012-04-03 DIAGNOSIS — Z5189 Encounter for other specified aftercare: Secondary | ICD-10-CM | POA: Insufficient documentation

## 2012-04-03 DIAGNOSIS — I5032 Chronic diastolic (congestive) heart failure: Secondary | ICD-10-CM | POA: Insufficient documentation

## 2012-04-03 DIAGNOSIS — N183 Chronic kidney disease, stage 3 unspecified: Secondary | ICD-10-CM | POA: Insufficient documentation

## 2012-04-03 DIAGNOSIS — I2589 Other forms of chronic ischemic heart disease: Secondary | ICD-10-CM | POA: Insufficient documentation

## 2012-04-03 DIAGNOSIS — I509 Heart failure, unspecified: Secondary | ICD-10-CM | POA: Insufficient documentation

## 2012-04-03 DIAGNOSIS — I252 Old myocardial infarction: Secondary | ICD-10-CM | POA: Insufficient documentation

## 2012-04-03 DIAGNOSIS — Z9861 Coronary angioplasty status: Secondary | ICD-10-CM | POA: Insufficient documentation

## 2012-04-03 DIAGNOSIS — I129 Hypertensive chronic kidney disease with stage 1 through stage 4 chronic kidney disease, or unspecified chronic kidney disease: Secondary | ICD-10-CM | POA: Insufficient documentation

## 2012-04-05 ENCOUNTER — Encounter (HOSPITAL_COMMUNITY)
Admission: RE | Admit: 2012-04-05 | Discharge: 2012-04-05 | Disposition: A | Discharge status: Home / Self Care | Payer: Self-pay | Source: Ambulatory Visit | Attending: Cardiovascular Disease | Admitting: Cardiovascular Disease

## 2012-04-05 NOTE — Progress Notes (Signed)
Pt restarted the Cardiac Maintenance Program. Pt tolerated exercise well without c/o.

## 2012-04-08 ENCOUNTER — Encounter (HOSPITAL_COMMUNITY)
Admission: RE | Admit: 2012-04-08 | Discharge: 2012-04-08 | Disposition: A | Discharge status: Home / Self Care | Payer: Self-pay | Source: Ambulatory Visit | Attending: Cardiovascular Disease | Admitting: Cardiovascular Disease

## 2012-04-10 ENCOUNTER — Encounter (HOSPITAL_COMMUNITY)
Admission: RE | Admit: 2012-04-10 | Discharge: 2012-04-10 | Disposition: A | Discharge status: Home / Self Care | Payer: Self-pay | Source: Ambulatory Visit | Attending: Cardiovascular Disease | Admitting: Cardiovascular Disease

## 2012-04-12 ENCOUNTER — Encounter (HOSPITAL_COMMUNITY)
Admission: RE | Admit: 2012-04-12 | Discharge: 2012-04-12 | Disposition: A | Discharge status: Home / Self Care | Payer: Self-pay | Source: Ambulatory Visit | Attending: Cardiovascular Disease | Admitting: Cardiovascular Disease

## 2012-04-15 ENCOUNTER — Encounter (HOSPITAL_COMMUNITY)
Admission: RE | Admit: 2012-04-15 | Discharge: 2012-04-15 | Disposition: A | Discharge status: Home / Self Care | Payer: Self-pay | Source: Ambulatory Visit | Attending: Cardiovascular Disease | Admitting: Cardiovascular Disease

## 2012-04-17 ENCOUNTER — Encounter (HOSPITAL_COMMUNITY)
Admission: RE | Admit: 2012-04-17 | Discharge: 2012-04-17 | Disposition: A | Discharge status: Home / Self Care | Payer: Self-pay | Source: Ambulatory Visit | Attending: Cardiovascular Disease | Admitting: Cardiovascular Disease

## 2012-04-19 ENCOUNTER — Encounter (HOSPITAL_COMMUNITY): Payer: Self-pay

## 2012-04-22 ENCOUNTER — Encounter (HOSPITAL_COMMUNITY)
Admission: RE | Admit: 2012-04-22 | Discharge: 2012-04-22 | Disposition: A | Discharge status: Home / Self Care | Payer: Self-pay | Source: Ambulatory Visit | Attending: Cardiovascular Disease | Admitting: Cardiovascular Disease

## 2012-04-24 ENCOUNTER — Encounter (HOSPITAL_COMMUNITY): Payer: Self-pay

## 2012-04-26 ENCOUNTER — Encounter (HOSPITAL_COMMUNITY)
Admission: RE | Admit: 2012-04-26 | Discharge: 2012-04-26 | Disposition: A | Discharge status: Home / Self Care | Payer: Self-pay | Source: Ambulatory Visit | Attending: Cardiovascular Disease | Admitting: Cardiovascular Disease

## 2012-04-26 DIAGNOSIS — Z9861 Coronary angioplasty status: Secondary | ICD-10-CM | POA: Insufficient documentation

## 2012-04-26 DIAGNOSIS — N183 Chronic kidney disease, stage 3 unspecified: Secondary | ICD-10-CM | POA: Insufficient documentation

## 2012-04-26 DIAGNOSIS — I129 Hypertensive chronic kidney disease with stage 1 through stage 4 chronic kidney disease, or unspecified chronic kidney disease: Secondary | ICD-10-CM | POA: Insufficient documentation

## 2012-04-26 DIAGNOSIS — Z5189 Encounter for other specified aftercare: Secondary | ICD-10-CM | POA: Insufficient documentation

## 2012-04-26 DIAGNOSIS — I252 Old myocardial infarction: Secondary | ICD-10-CM | POA: Insufficient documentation

## 2012-04-26 DIAGNOSIS — I5032 Chronic diastolic (congestive) heart failure: Secondary | ICD-10-CM | POA: Insufficient documentation

## 2012-04-26 DIAGNOSIS — I2589 Other forms of chronic ischemic heart disease: Secondary | ICD-10-CM | POA: Insufficient documentation

## 2012-04-26 DIAGNOSIS — I509 Heart failure, unspecified: Secondary | ICD-10-CM | POA: Insufficient documentation

## 2012-04-29 ENCOUNTER — Encounter (HOSPITAL_COMMUNITY)
Admission: RE | Admit: 2012-04-29 | Discharge: 2012-04-29 | Disposition: A | Discharge status: Home / Self Care | Payer: Self-pay | Source: Ambulatory Visit | Attending: Cardiovascular Disease | Admitting: Cardiovascular Disease

## 2012-05-01 ENCOUNTER — Encounter (HOSPITAL_COMMUNITY): Payer: Self-pay

## 2012-05-03 ENCOUNTER — Encounter (HOSPITAL_COMMUNITY): Payer: Self-pay

## 2012-05-06 ENCOUNTER — Encounter (HOSPITAL_COMMUNITY)
Admission: RE | Admit: 2012-05-06 | Discharge: 2012-05-06 | Disposition: A | Discharge status: Home / Self Care | Payer: Self-pay | Source: Ambulatory Visit | Attending: Cardiovascular Disease | Admitting: Cardiovascular Disease

## 2012-05-08 ENCOUNTER — Encounter (HOSPITAL_COMMUNITY)
Admission: RE | Admit: 2012-05-08 | Discharge: 2012-05-08 | Disposition: A | Discharge status: Home / Self Care | Payer: Self-pay | Source: Ambulatory Visit | Attending: Cardiovascular Disease | Admitting: Cardiovascular Disease

## 2012-05-10 ENCOUNTER — Encounter (HOSPITAL_COMMUNITY)
Admission: RE | Admit: 2012-05-10 | Discharge: 2012-05-10 | Disposition: A | Discharge status: Home / Self Care | Payer: Self-pay | Source: Ambulatory Visit | Attending: Cardiovascular Disease | Admitting: Cardiovascular Disease

## 2012-05-13 ENCOUNTER — Encounter (HOSPITAL_COMMUNITY)
Admission: RE | Admit: 2012-05-13 | Discharge: 2012-05-13 | Disposition: A | Discharge status: Home / Self Care | Payer: Self-pay | Source: Ambulatory Visit | Attending: Cardiovascular Disease | Admitting: Cardiovascular Disease

## 2012-05-15 ENCOUNTER — Encounter (HOSPITAL_COMMUNITY): Payer: Self-pay

## 2012-05-17 ENCOUNTER — Encounter (HOSPITAL_COMMUNITY)
Admission: RE | Admit: 2012-05-17 | Discharge: 2012-05-17 | Disposition: A | Discharge status: Home / Self Care | Payer: Self-pay | Source: Ambulatory Visit | Attending: Cardiovascular Disease | Admitting: Cardiovascular Disease

## 2012-05-20 ENCOUNTER — Encounter (HOSPITAL_COMMUNITY): Payer: Self-pay

## 2012-05-22 ENCOUNTER — Encounter (HOSPITAL_COMMUNITY)
Admission: RE | Admit: 2012-05-22 | Discharge: 2012-05-22 | Disposition: A | Discharge status: Home / Self Care | Payer: Self-pay | Source: Ambulatory Visit | Attending: Cardiovascular Disease | Admitting: Cardiovascular Disease

## 2012-05-27 ENCOUNTER — Encounter (HOSPITAL_COMMUNITY)
Admission: RE | Admit: 2012-05-27 | Discharge: 2012-05-27 | Disposition: A | Discharge status: Home / Self Care | Payer: Self-pay | Source: Ambulatory Visit | Attending: Cardiovascular Disease | Admitting: Cardiovascular Disease

## 2012-05-27 DIAGNOSIS — Z5189 Encounter for other specified aftercare: Secondary | ICD-10-CM | POA: Insufficient documentation

## 2012-05-27 DIAGNOSIS — I509 Heart failure, unspecified: Secondary | ICD-10-CM | POA: Insufficient documentation

## 2012-05-27 DIAGNOSIS — N183 Chronic kidney disease, stage 3 unspecified: Secondary | ICD-10-CM | POA: Insufficient documentation

## 2012-05-27 DIAGNOSIS — I2589 Other forms of chronic ischemic heart disease: Secondary | ICD-10-CM | POA: Insufficient documentation

## 2012-05-27 DIAGNOSIS — I129 Hypertensive chronic kidney disease with stage 1 through stage 4 chronic kidney disease, or unspecified chronic kidney disease: Secondary | ICD-10-CM | POA: Insufficient documentation

## 2012-05-27 DIAGNOSIS — I5032 Chronic diastolic (congestive) heart failure: Secondary | ICD-10-CM | POA: Insufficient documentation

## 2012-05-27 DIAGNOSIS — Z9861 Coronary angioplasty status: Secondary | ICD-10-CM | POA: Insufficient documentation

## 2012-05-27 DIAGNOSIS — I252 Old myocardial infarction: Secondary | ICD-10-CM | POA: Insufficient documentation

## 2012-05-29 ENCOUNTER — Encounter (HOSPITAL_COMMUNITY): Payer: Self-pay

## 2012-05-31 ENCOUNTER — Encounter (HOSPITAL_COMMUNITY)
Admission: RE | Admit: 2012-05-31 | Discharge: 2012-05-31 | Disposition: A | Discharge status: Home / Self Care | Payer: Self-pay | Source: Ambulatory Visit | Attending: Cardiovascular Disease | Admitting: Cardiovascular Disease

## 2012-06-03 ENCOUNTER — Encounter (HOSPITAL_COMMUNITY): Payer: Self-pay

## 2012-06-05 ENCOUNTER — Encounter (HOSPITAL_COMMUNITY)
Admission: RE | Admit: 2012-06-05 | Discharge: 2012-06-05 | Disposition: A | Discharge status: Home / Self Care | Payer: Self-pay | Source: Ambulatory Visit | Attending: Cardiovascular Disease | Admitting: Cardiovascular Disease

## 2012-06-07 ENCOUNTER — Encounter (HOSPITAL_COMMUNITY)
Admission: RE | Admit: 2012-06-07 | Discharge: 2012-06-07 | Disposition: A | Discharge status: Home / Self Care | Payer: Self-pay | Source: Ambulatory Visit | Attending: Cardiovascular Disease | Admitting: Cardiovascular Disease

## 2012-06-10 ENCOUNTER — Encounter (HOSPITAL_COMMUNITY)
Admission: RE | Admit: 2012-06-10 | Discharge: 2012-06-10 | Disposition: A | Discharge status: Home / Self Care | Payer: Self-pay | Source: Ambulatory Visit | Attending: Cardiovascular Disease | Admitting: Cardiovascular Disease

## 2012-06-12 ENCOUNTER — Encounter (HOSPITAL_COMMUNITY): Payer: Self-pay

## 2012-06-14 ENCOUNTER — Encounter (HOSPITAL_COMMUNITY)
Admission: RE | Admit: 2012-06-14 | Discharge: 2012-06-14 | Disposition: A | Discharge status: Home / Self Care | Payer: Self-pay | Source: Ambulatory Visit | Attending: Cardiovascular Disease | Admitting: Cardiovascular Disease

## 2012-06-17 ENCOUNTER — Encounter (HOSPITAL_COMMUNITY): Payer: Self-pay

## 2012-06-21 ENCOUNTER — Encounter (HOSPITAL_COMMUNITY)
Admission: RE | Admit: 2012-06-21 | Discharge: 2012-06-21 | Disposition: A | Discharge status: Home / Self Care | Payer: Self-pay | Source: Ambulatory Visit | Attending: Cardiovascular Disease | Admitting: Cardiovascular Disease

## 2012-06-24 ENCOUNTER — Encounter (HOSPITAL_COMMUNITY): Payer: Self-pay

## 2012-06-28 ENCOUNTER — Encounter (HOSPITAL_COMMUNITY): Payer: Self-pay

## 2012-06-28 DIAGNOSIS — Z9861 Coronary angioplasty status: Secondary | ICD-10-CM | POA: Insufficient documentation

## 2012-06-28 DIAGNOSIS — N183 Chronic kidney disease, stage 3 unspecified: Secondary | ICD-10-CM | POA: Insufficient documentation

## 2012-06-28 DIAGNOSIS — Z5189 Encounter for other specified aftercare: Secondary | ICD-10-CM | POA: Insufficient documentation

## 2012-06-28 DIAGNOSIS — I509 Heart failure, unspecified: Secondary | ICD-10-CM | POA: Insufficient documentation

## 2012-06-28 DIAGNOSIS — I129 Hypertensive chronic kidney disease with stage 1 through stage 4 chronic kidney disease, or unspecified chronic kidney disease: Secondary | ICD-10-CM | POA: Insufficient documentation

## 2012-06-28 DIAGNOSIS — I5032 Chronic diastolic (congestive) heart failure: Secondary | ICD-10-CM | POA: Insufficient documentation

## 2012-06-28 DIAGNOSIS — I2589 Other forms of chronic ischemic heart disease: Secondary | ICD-10-CM | POA: Insufficient documentation

## 2012-06-28 DIAGNOSIS — I252 Old myocardial infarction: Secondary | ICD-10-CM | POA: Insufficient documentation

## 2012-07-01 ENCOUNTER — Encounter (HOSPITAL_COMMUNITY): Payer: Self-pay

## 2012-07-03 ENCOUNTER — Encounter (HOSPITAL_COMMUNITY)
Admission: RE | Admit: 2012-07-03 | Discharge: 2012-07-03 | Disposition: A | Discharge status: Home / Self Care | Payer: Self-pay | Source: Ambulatory Visit | Attending: Cardiovascular Disease | Admitting: Cardiovascular Disease

## 2012-07-05 ENCOUNTER — Encounter (HOSPITAL_COMMUNITY)
Admission: RE | Admit: 2012-07-05 | Discharge: 2012-07-05 | Disposition: A | Discharge status: Home / Self Care | Payer: Self-pay | Source: Ambulatory Visit | Attending: Cardiovascular Disease | Admitting: Cardiovascular Disease

## 2012-07-08 ENCOUNTER — Encounter (HOSPITAL_COMMUNITY): Payer: Self-pay

## 2012-07-10 ENCOUNTER — Encounter (HOSPITAL_COMMUNITY): Payer: Self-pay

## 2012-07-12 ENCOUNTER — Encounter (HOSPITAL_COMMUNITY)
Admission: RE | Admit: 2012-07-12 | Discharge: 2012-07-12 | Disposition: A | Discharge status: Home / Self Care | Payer: Self-pay | Source: Ambulatory Visit | Attending: Cardiovascular Disease | Admitting: Cardiovascular Disease

## 2012-07-15 ENCOUNTER — Encounter (HOSPITAL_COMMUNITY)
Admission: RE | Admit: 2012-07-15 | Discharge: 2012-07-15 | Disposition: A | Discharge status: Home / Self Care | Payer: Self-pay | Source: Ambulatory Visit | Attending: Cardiovascular Disease | Admitting: Cardiovascular Disease

## 2012-07-17 ENCOUNTER — Encounter (HOSPITAL_COMMUNITY): Payer: Self-pay

## 2012-07-19 ENCOUNTER — Encounter (HOSPITAL_COMMUNITY)
Admission: RE | Admit: 2012-07-19 | Discharge: 2012-07-19 | Disposition: A | Discharge status: Home / Self Care | Payer: Self-pay | Source: Ambulatory Visit | Attending: Cardiovascular Disease | Admitting: Cardiovascular Disease

## 2012-07-22 ENCOUNTER — Encounter (HOSPITAL_COMMUNITY): Payer: Self-pay

## 2012-07-24 ENCOUNTER — Encounter (HOSPITAL_COMMUNITY): Payer: Self-pay

## 2012-07-26 ENCOUNTER — Encounter (HOSPITAL_COMMUNITY)
Admission: RE | Admit: 2012-07-26 | Discharge: 2012-07-26 | Disposition: A | Discharge status: Home / Self Care | Payer: Self-pay | Source: Ambulatory Visit | Attending: Cardiovascular Disease | Admitting: Cardiovascular Disease

## 2012-07-29 ENCOUNTER — Encounter (HOSPITAL_COMMUNITY): Payer: Self-pay

## 2012-07-29 DIAGNOSIS — I252 Old myocardial infarction: Secondary | ICD-10-CM | POA: Insufficient documentation

## 2012-07-29 DIAGNOSIS — Z5189 Encounter for other specified aftercare: Secondary | ICD-10-CM | POA: Insufficient documentation

## 2012-07-29 DIAGNOSIS — Z9861 Coronary angioplasty status: Secondary | ICD-10-CM | POA: Insufficient documentation

## 2012-07-29 DIAGNOSIS — I509 Heart failure, unspecified: Secondary | ICD-10-CM | POA: Insufficient documentation

## 2012-07-29 DIAGNOSIS — I2589 Other forms of chronic ischemic heart disease: Secondary | ICD-10-CM | POA: Insufficient documentation

## 2012-07-29 DIAGNOSIS — I129 Hypertensive chronic kidney disease with stage 1 through stage 4 chronic kidney disease, or unspecified chronic kidney disease: Secondary | ICD-10-CM | POA: Insufficient documentation

## 2012-07-29 DIAGNOSIS — I5032 Chronic diastolic (congestive) heart failure: Secondary | ICD-10-CM | POA: Insufficient documentation

## 2012-07-29 DIAGNOSIS — N183 Chronic kidney disease, stage 3 unspecified: Secondary | ICD-10-CM | POA: Insufficient documentation

## 2012-07-31 ENCOUNTER — Encounter (HOSPITAL_COMMUNITY)
Admission: RE | Admit: 2012-07-31 | Discharge: 2012-07-31 | Disposition: A | Discharge status: Home / Self Care | Payer: Self-pay | Source: Ambulatory Visit | Attending: Cardiovascular Disease | Admitting: Cardiovascular Disease

## 2012-08-02 ENCOUNTER — Encounter (HOSPITAL_COMMUNITY)
Admission: RE | Admit: 2012-08-02 | Discharge: 2012-08-02 | Disposition: A | Discharge status: Home / Self Care | Payer: Self-pay | Source: Ambulatory Visit | Attending: Cardiovascular Disease | Admitting: Cardiovascular Disease

## 2012-08-05 ENCOUNTER — Encounter (HOSPITAL_COMMUNITY): Payer: Self-pay

## 2012-08-07 ENCOUNTER — Encounter (HOSPITAL_COMMUNITY): Payer: Self-pay

## 2012-08-09 ENCOUNTER — Encounter (HOSPITAL_COMMUNITY): Payer: Self-pay

## 2012-08-12 ENCOUNTER — Encounter (HOSPITAL_COMMUNITY): Payer: Self-pay

## 2012-08-14 ENCOUNTER — Encounter (HOSPITAL_COMMUNITY): Payer: Self-pay

## 2012-08-16 ENCOUNTER — Encounter (HOSPITAL_COMMUNITY)
Admission: RE | Admit: 2012-08-16 | Discharge: 2012-08-16 | Disposition: A | Discharge status: Home / Self Care | Payer: Self-pay | Source: Ambulatory Visit | Attending: Cardiovascular Disease | Admitting: Cardiovascular Disease

## 2012-08-19 ENCOUNTER — Encounter (HOSPITAL_COMMUNITY): Payer: Self-pay

## 2012-08-21 ENCOUNTER — Encounter (HOSPITAL_COMMUNITY): Payer: Self-pay

## 2012-08-23 ENCOUNTER — Encounter (HOSPITAL_COMMUNITY)
Admission: RE | Admit: 2012-08-23 | Discharge: 2012-08-23 | Disposition: A | Discharge status: Home / Self Care | Payer: Self-pay | Source: Ambulatory Visit | Attending: Cardiovascular Disease | Admitting: Cardiovascular Disease

## 2012-08-26 ENCOUNTER — Encounter (HOSPITAL_COMMUNITY): Payer: Self-pay

## 2012-08-26 DIAGNOSIS — Z5189 Encounter for other specified aftercare: Secondary | ICD-10-CM | POA: Insufficient documentation

## 2012-08-26 DIAGNOSIS — I2589 Other forms of chronic ischemic heart disease: Secondary | ICD-10-CM | POA: Insufficient documentation

## 2012-08-26 DIAGNOSIS — Z9861 Coronary angioplasty status: Secondary | ICD-10-CM | POA: Insufficient documentation

## 2012-08-26 DIAGNOSIS — I252 Old myocardial infarction: Secondary | ICD-10-CM | POA: Insufficient documentation

## 2012-08-26 DIAGNOSIS — I129 Hypertensive chronic kidney disease with stage 1 through stage 4 chronic kidney disease, or unspecified chronic kidney disease: Secondary | ICD-10-CM | POA: Insufficient documentation

## 2012-08-26 DIAGNOSIS — N183 Chronic kidney disease, stage 3 unspecified: Secondary | ICD-10-CM | POA: Insufficient documentation

## 2012-08-26 DIAGNOSIS — I5032 Chronic diastolic (congestive) heart failure: Secondary | ICD-10-CM | POA: Insufficient documentation

## 2012-08-26 DIAGNOSIS — I509 Heart failure, unspecified: Secondary | ICD-10-CM | POA: Insufficient documentation

## 2012-08-28 ENCOUNTER — Encounter (HOSPITAL_COMMUNITY): Payer: Self-pay

## 2012-08-30 ENCOUNTER — Encounter (HOSPITAL_COMMUNITY): Payer: Self-pay

## 2012-09-02 ENCOUNTER — Encounter (HOSPITAL_COMMUNITY): Payer: Self-pay

## 2012-09-04 ENCOUNTER — Encounter (HOSPITAL_COMMUNITY)
Admission: RE | Admit: 2012-09-04 | Discharge: 2012-09-04 | Disposition: A | Discharge status: Home / Self Care | Payer: Self-pay | Source: Ambulatory Visit | Attending: Cardiovascular Disease | Admitting: Cardiovascular Disease

## 2012-09-06 ENCOUNTER — Encounter (HOSPITAL_COMMUNITY)
Admission: RE | Admit: 2012-09-06 | Discharge: 2012-09-06 | Disposition: A | Discharge status: Home / Self Care | Payer: Self-pay | Source: Ambulatory Visit | Attending: Cardiovascular Disease | Admitting: Cardiovascular Disease

## 2012-09-09 ENCOUNTER — Encounter (HOSPITAL_COMMUNITY): Payer: Self-pay

## 2012-09-11 ENCOUNTER — Encounter (HOSPITAL_COMMUNITY): Payer: Self-pay

## 2012-09-13 ENCOUNTER — Encounter (HOSPITAL_COMMUNITY): Payer: Self-pay

## 2012-09-16 ENCOUNTER — Encounter (HOSPITAL_COMMUNITY): Payer: Self-pay

## 2012-09-18 ENCOUNTER — Encounter (HOSPITAL_COMMUNITY): Payer: Self-pay

## 2012-09-20 ENCOUNTER — Encounter (HOSPITAL_COMMUNITY): Payer: Self-pay

## 2012-09-23 ENCOUNTER — Encounter (HOSPITAL_COMMUNITY): Payer: Self-pay

## 2012-11-11 ENCOUNTER — Telehealth: Payer: Self-pay | Admitting: Cardiovascular Disease

## 2012-11-11 NOTE — Telephone Encounter (Signed)
Paper chart requested.

## 2012-11-11 NOTE — Telephone Encounter (Signed)
Patient states that his dosage for Stironolactone 25 mg has been changed to 1/2 tab bid.  Needs new rs called to [facility] on [facility] [facility] [address] [facility] [address]  [phone].

## 2012-11-12 MED ORDER — SPIRONOLACTONE 25 MG PO TABS: 12.5000 mg | ORAL_TABLET | Freq: Two times a day (BID) | ORAL | Status: DC

## 2012-11-12 NOTE — Telephone Encounter (Signed)
Returned call.  Pt stated the pharmacy told him his rx for spironolactone would not be filled until June 3rd.  Stated the last time he saw Dr. Tresa Endo he was told to take 1/2 tab twice daily.  Chart reviewed and pt informed new rx instructions will be sent to pharmacy.  Pt also informed he needs an annual appt as it has almost been a year since is last visit.  Pt verbalized understanding and appt scheduled for 6.17.14 at 1:45pm w/ Dr. Tresa Endo.

## 2012-12-09 ENCOUNTER — Encounter: Payer: Self-pay | Admitting: Cardiovascular Disease

## 2012-12-10 ENCOUNTER — Ambulatory Visit (INDEPENDENT_AMBULATORY_CARE_PROVIDER_SITE_OTHER): Payer: Medicaid Other | Admitting: Cardiovascular Disease

## 2012-12-10 ENCOUNTER — Encounter: Payer: Self-pay | Admitting: Cardiovascular Disease

## 2012-12-10 VITALS — BP 120/80 | HR 64 | Ht 70.0 in | Wt 186.8 lb

## 2012-12-10 DIAGNOSIS — Z95810 Presence of automatic (implantable) cardiac defibrillator: Secondary | ICD-10-CM

## 2012-12-10 DIAGNOSIS — I5189 Other ill-defined heart diseases: Secondary | ICD-10-CM

## 2012-12-10 DIAGNOSIS — E785 Hyperlipidemia, unspecified: Secondary | ICD-10-CM

## 2012-12-10 DIAGNOSIS — I119 Hypertensive heart disease without heart failure: Secondary | ICD-10-CM

## 2012-12-10 DIAGNOSIS — I519 Heart disease, unspecified: Secondary | ICD-10-CM

## 2012-12-10 DIAGNOSIS — I2510 Atherosclerotic heart disease of native coronary artery without angina pectoris: Secondary | ICD-10-CM

## 2012-12-10 DIAGNOSIS — Z79899 Other long term (current) drug therapy: Secondary | ICD-10-CM

## 2012-12-10 DIAGNOSIS — I255 Ischemic cardiomyopathy: Secondary | ICD-10-CM

## 2012-12-10 DIAGNOSIS — I472 Ventricular tachycardia, unspecified: Secondary | ICD-10-CM

## 2012-12-10 DIAGNOSIS — I4729 Other ventricular tachycardia: Secondary | ICD-10-CM

## 2012-12-10 DIAGNOSIS — I2589 Other forms of chronic ischemic heart disease: Secondary | ICD-10-CM

## 2012-12-10 LAB — PACEMAKER DEVICE OBSERVATION

## 2012-12-10 MED ORDER — CARVEDILOL 12.5 MG PO TABS: 25.0000 mg | ORAL_TABLET | Freq: Two times a day (BID) | ORAL | Status: DC

## 2012-12-10 MED ORDER — ISOSORBIDE MONONITRATE ER 30 MG PO TB24: 30.0000 mg | ORAL_TABLET | Freq: Every day | ORAL | Status: DC

## 2012-12-10 NOTE — Progress Notes (Signed)
In office ICD interrogation. Normal device function. No changes made this session. 

## 2012-12-10 NOTE — Patient Instructions (Addendum)
Go home and set up your remote transmitter. The instructions will be located in box. Please call if there are any questions. You are to follow up with Dr. Royann Shivers in September for your yearly appointment.  Your physician recommends that you return for fasting lab work.  Your physician has recommended you make the following change in your medication: Increase your carvedilol to 18.75 ( 1 & 1/2 pills of the 12.5 mg pills twice a day) for two weeks then start new prescription given for 25 mg.  Your physician recommends that you schedule a follow-up appointment in: 3 months.

## 2012-12-11 ENCOUNTER — Encounter: Payer: Self-pay | Admitting: Cardiovascular Disease

## 2012-12-11 DIAGNOSIS — I472 Ventricular tachycardia, unspecified: Secondary | ICD-10-CM | POA: Insufficient documentation

## 2012-12-11 DIAGNOSIS — I255 Ischemic cardiomyopathy: Secondary | ICD-10-CM | POA: Insufficient documentation

## 2012-12-11 DIAGNOSIS — I25118 Atherosclerotic heart disease of native coronary artery with other forms of angina pectoris: Secondary | ICD-10-CM | POA: Insufficient documentation

## 2012-12-11 DIAGNOSIS — I5189 Other ill-defined heart diseases: Secondary | ICD-10-CM | POA: Insufficient documentation

## 2012-12-11 DIAGNOSIS — I2510 Atherosclerotic heart disease of native coronary artery without angina pectoris: Secondary | ICD-10-CM | POA: Insufficient documentation

## 2012-12-11 DIAGNOSIS — E785 Hyperlipidemia, unspecified: Secondary | ICD-10-CM | POA: Insufficient documentation

## 2012-12-11 NOTE — Progress Notes (Signed)
Patient ID: Steven [name], male   DOB: 02/04/1957, 56 y.o.   MRN: [id number]     HPI: Steven [name], is a 57 y.o. male who presents to the office today for cardiology evaluation. I last saw him in June 2013.  Mr. is a 57 year old gentleman originally from [place] who in January 2012 suffered a large out of hospital anterior wall myocardial infarction.  He presented after a three-hour delay also lead to: Hospital taken emergently to the cardiac catheterization laboratory where I performed successful emergent intervention with the door to balloon time of only 25 minutes. His LAD was successfully recanalized. CPK increased to 8012 with an MB of 716 and troponin was greater than 100 initially. He subsequently woe a life vest and ultimately after several months without significant improvement of LV function he underwent implantable cardio defibrillator insertion.  Recently, he has felt fairly stable. He denies recent episodes of significant chest tightness, but he has noticed some vague sensation of discomfort if he overexerts himself. He notes some shortness of breath with moderate activity. He is unaware of any defibrillator discharge.  His defibrillator was interrogated today and he has a ProTect XD dual-chamber defibrillator. He was noted to have 5 episodes of nonsustained VT since 12/11/2011 with his last episode occurring on 11/05/2012. Maximum duration was 3 seconds and maximum weight 237 beats. All of these reverted spontaneously.  Past Medical History  Diagnosis Date  . Acute MI, anterior wall   . Inguinal hernia, left   . CAD (coronary artery disease)     2D ECHO, 07/13/2011 - EF <25%, LV moderatelty dilated, LA moderately dilatedLEXISCAN, 12/14/2011 - moderate-severe perfusion defect seen in the basal anteroseptal, mid anterior, apicacl anterior, apical, apical inferior, and apical lateral regions, post-stress EF 25%, new EKG changes from baseline abnormalities    Past Surgical History  Procedure  Laterality Date  . Cardiac defibrillator placement  03/02/2011    Medtronic Protecta XT DR, model [id number], serial [id number]  . Cardiac catheterization  11/25/2010    Predilation balloon-Apex monorail 2x46mm, Cutting balloon-2.25x40mm, resulting in a reduction of 100% stenosis down to less than 10%  . Pacemaker placement  07/14/2010    Temporary placement of pacemaker, if rhythm issue continues will need a permanent device  . Cardiac catheterization  07/12/2010    LAD stented with a 3.5x91mm bare-metal Veriflex stent resulting in a reduction of 100% lesion to 0%    No Known Allergies  Current Outpatient Prescriptions  Medication Sig Dispense Refill  . aspirin 81 MG tablet Take 81 mg by mouth daily.      . carvedilol (COREG) 12.5 MG tablet Take 2 tablets (25 mg total) by mouth 2 (two) times daily with a meal.  60 tablet  6  . clopidogrel (PLAVIX) 75 MG tablet Take 75 mg by mouth daily.      . furosemide (LASIX) 40 MG tablet Take 40 mg by mouth 2 (two) times daily.        . nitroGLYCERIN (NITROSTAT) 0.4 MG SL tablet Place 0.4 mg under the tongue every 5 (five) minutes as needed.        . simvastatin (ZOCOR) 40 MG tablet Take 40 mg by mouth at bedtime.        [name] spironolactone (ALDACTONE) 25 MG tablet Take 0.5 tablets (12.5 mg total) by mouth 2 (two) times daily.  60 tablet  1  . isosorbide mononitrate (IMDUR) 30 MG 24 hr tablet Take 1 tablet (30 mg total) by mouth daily.  30  tablet  6   No current facility-administered medications for this visit.   Social he is single. He has one child. He was born in [place]. There is a remote tobacco history. He does drink occasional alcohol.  ROS is negative for fever chills or night sweats he denies any awareness of palpitations. He denies PND orthopnea. He has noticed some very mild discomfort in his chest with significant activity appear but this is minimal. He denies bleeding. He does have a history of an inguinal hernia he also has had issues with  erectile dysfunction. He denies recent leg swelling. He denies bleeding. He denies paresthesias. He denies visual change. Other system review is negative.  PE BP 120/80  Pulse 64  Ht 5\' 10"  (1.778 m)  Wt 186 lb 12.8 oz (84.732 kg)  BMI 26.8 kg/m2  General: Alert, oriented, no distress.  HEENT: Normocephalic, atraumatic. Pupils round and reactive; sclera anicteric;no lid lag  Nose without nasal septal hypertrophy Mouth/Parynx benign; Mallinpatti scale 3 Neck: No JVD, no carotid briuts Lungs: clear to ausculatation and percussion; no wheezing or rales Heart: RRR, s1 s2 normal 1/6 systolic murmur. Abdomen: soft, nontender; no hepatosplenomehaly, BS+; abdominal aorta nontender and not dilated by palpation. Pulses 2+ Extremities: no clubbing cyanosis or edema, Homan's sign negative  Neurologic: grossly nonfocal  ECG: Atrial lead paced rhythm at 64 beats per minute. Evidence for his prior anterolateral wall myocardial infarction.  LABS:  BMET    Component Value Date/Time   NA 136 11/27/2010 0500   K 4.0 11/27/2010 0500   CL 101 11/27/2010 0500   CO2 23 11/27/2010 0500   GLUCOSE 100* 11/27/2010 0500   BUN 27* 11/27/2010 0500   CREATININE 1.45 11/27/2010 0500   CALCIUM 9.2 11/27/2010 0500   GFRNONAA 51* 11/27/2010 0500   GFRAA  Value: >60        The eGFR has been calculated using the MDRD equation. This calculation has not been validated in all clinical situations. eGFR's persistently <60 mL/min signify possible Chronic Kidney Disease. 11/27/2010 0500     Hepatic Function Panel     Component Value Date/Time   PROT 7.0 11/24/2010 0410   ALBUMIN 3.4* 11/24/2010 0410   AST 42* 11/24/2010 0410   ALT 27 11/24/2010 0410   ALKPHOS 86 11/24/2010 0410   BILITOT 0.7 11/24/2010 0410   BILIDIR 0.1 11/23/2010 2007   IBILI 0.5 11/23/2010 2007     CBC    Component Value Date/Time   WBC 9.3 11/26/2010 0500   RBC 4.22 11/26/2010 0500   HGB 13.4 11/26/2010 0500   HCT 38.1* 11/26/2010 0500   PLT 171 11/26/2010 0500    MCV 90.3 11/26/2010 0500   MCH 31.8 11/26/2010 0500   MCHC 35.2 11/26/2010 0500   RDW 14.4 11/26/2010 0500   LYMPHSABS 1.8 11/23/2010 2007   MONOABS 2.0* 11/23/2010 2007   EOSABS 0.0 11/23/2010 2007   BASOSABS 0.0 11/23/2010 2007     BNP    Component Value Date/Time   PROBNP 9719.0* 11/23/2010 2006    Lipid Panel     Component Value Date/Time   CHOL  Value: 133        ATP III CLASSIFICATION:  <200     mg/dL   Desirable  478-295  mg/dL   Borderline High  >=621    mg/dL   High        08/31/6576 0520   TRIG 70 07/13/2010 0520   HDL 39* 07/13/2010 0520   CHOLHDL 3.4 07/13/2010  0520   VLDL 14 07/13/2010 0520   LDLCALC  Value: 80        Total Cholesterol/HDL:CHD Risk Coronary Heart Disease Risk Table                     Men   Women  1/2 Average Risk   3.4   3.3  Average Risk       5.0   4.4  2 X Average Risk   9.6   7.1  3 X Average Risk  23.4   11.0        Use the calculated Patient Ratio above and the CHD Risk Table to determine the patient's CHD Risk.        ATP III CLASSIFICATION (LDL):  <100     mg/dL   Optimal  161-096  mg/dL   Near or Above                    Optimal  130-159  mg/dL   Borderline  045-409  mg/dL   High  >811     mg/dL   Very High 03/09/7828 5621     RADIOLOGY: No results found.    ASSESSMENT AND PLAN: From a cardiac standpoint, Steven [name] is now 2-1/2 years following his large anterior wall myocardial infarction. He did undergo a subsequent  cardiac catheterization response on June 2012  which showed a patent stent in the LAD but he now had an occluded marginal branch of his LCX with collaterals. There also was 80% first diagonal stenosis in a small vessel and 40% RCA stenosis.He does have an ischemic cardiomyopathy. He apparently has experienced 5 episodes of nonsustained ventricular tachycardia. Presently, I am recommending further titration of his carvedilol from his present dose of 12.5 twice a day to 18.75 mg twice a day for one week and then 25 mg twice a day. I'm also adding  Imdur 30 mg daily. We did discuss the potential for ranolazine but at present he has potential significant cost issues with continued therapy if this was to be prescribed. I will check laboratory including a CBC, Comprhensive metabolic panel, lipid profile and TSH. I will see him in 3 months for followup evaluation.     Lennette Bihari, MD, [facility]  12/11/2012 6:14 PM

## 2012-12-13 LAB — CBC
HCT: 45.1 % (ref 39.0–52.0)
MCHC: 35.5 g/dL (ref 30.0–36.0)
MCV: 90.4 fL (ref 78.0–100.0)
Platelets: 147 10*3/uL — ABNORMAL LOW (ref 150–400)
RDW: 13.1 % (ref 11.5–15.5)
WBC: 5.8 10*3/uL (ref 4.0–10.5)

## 2012-12-13 LAB — COMPREHENSIVE METABOLIC PANEL
ALT: 12 U/L (ref 0–53)
AST: 24 U/L (ref 0–37)
Alkaline Phosphatase: 55 U/L (ref 39–117)
BUN: 20 mg/dL (ref 6–23)
Chloride: 103 mEq/L (ref 96–112)
Creat: 1.67 mg/dL — ABNORMAL HIGH (ref 0.50–1.35)
Total Bilirubin: 0.9 mg/dL (ref 0.3–1.2)

## 2012-12-14 LAB — LIPID PANEL
HDL: 38 mg/dL — ABNORMAL LOW (ref >39)
LDL Cholesterol: 76 mg/dL (ref 0–99)
Total CHOL/HDL Ratio: 3.4 Ratio
Triglycerides: 70 mg/dL (ref <150)
VLDL: 14 mg/dL (ref 0–40)

## 2012-12-18 ENCOUNTER — Encounter: Payer: Medicaid Other | Admitting: Cardiovascular Disease

## 2012-12-18 ENCOUNTER — Other Ambulatory Visit: Payer: Self-pay | Admitting: *Deleted

## 2012-12-18 MED ORDER — CLOPIDOGREL BISULFATE 75 MG PO TABS: 75.0000 mg | ORAL_TABLET | Freq: Every day | ORAL | Status: DC

## 2013-01-12 ENCOUNTER — Other Ambulatory Visit: Payer: Self-pay | Admitting: Cardiovascular Disease

## 2013-01-12 DIAGNOSIS — I428 Other cardiomyopathies: Secondary | ICD-10-CM

## 2013-01-13 NOTE — Telephone Encounter (Signed)
Furosemide refilled w/6 refills

## 2013-01-21 ENCOUNTER — Encounter: Payer: Self-pay | Admitting: *Deleted

## 2013-01-21 LAB — REMOTE ICD DEVICE
AL AMPLITUDE: 1.6 mv
AL IMPEDENCE ICD: 437 Ohm
BAMS-0001: 171 {beats}/min
BATTERY VOLTAGE: 3.16 V
CHARGE TIME: 9.2 s
DEV-0020ICD: NEGATIVE
MODE SWITCH EPISODES: 0
PACEART VT: 0
TZON-0004VSLOWVT: 32
VENTRICULAR PACING ICD: 0.1 pct

## 2013-01-22 ENCOUNTER — Encounter: Payer: Self-pay | Admitting: *Deleted

## 2013-01-24 ENCOUNTER — Encounter: Payer: Self-pay | Admitting: *Deleted

## 2013-02-03 ENCOUNTER — Other Ambulatory Visit: Payer: Self-pay | Admitting: *Deleted

## 2013-02-03 MED ORDER — SIMVASTATIN 40 MG PO TABS: 40.0000 mg | ORAL_TABLET | Freq: Every day | ORAL | Status: DC

## 2013-02-03 NOTE — Telephone Encounter (Signed)
Rx was sent to pharmacy electronically. 

## 2013-02-05 ENCOUNTER — Ambulatory Visit (INDEPENDENT_AMBULATORY_CARE_PROVIDER_SITE_OTHER): Payer: Medicaid Other | Admitting: Cardiovascular Disease

## 2013-02-05 ENCOUNTER — Encounter: Payer: Self-pay | Admitting: Cardiovascular Disease

## 2013-02-05 VITALS — BP 132/78 | HR 92 | Ht 70.0 in | Wt 181.1 lb

## 2013-02-05 DIAGNOSIS — I255 Ischemic cardiomyopathy: Secondary | ICD-10-CM

## 2013-02-05 DIAGNOSIS — I2510 Atherosclerotic heart disease of native coronary artery without angina pectoris: Secondary | ICD-10-CM

## 2013-02-05 DIAGNOSIS — I2589 Other forms of chronic ischemic heart disease: Secondary | ICD-10-CM

## 2013-02-05 DIAGNOSIS — Z95810 Presence of automatic (implantable) cardiac defibrillator: Secondary | ICD-10-CM

## 2013-02-05 DIAGNOSIS — I472 Ventricular tachycardia, unspecified: Secondary | ICD-10-CM

## 2013-02-05 DIAGNOSIS — E785 Hyperlipidemia, unspecified: Secondary | ICD-10-CM

## 2013-02-05 NOTE — Assessment & Plan Note (Signed)
Large anterior wall STEMI January 2012, inferolateral non-STEMI June 2012, both treated with urgent but rather late angioplasty/stent secondary to delayed presentation. Currently angina free. The last nuclear study showed evidence of a large anterior scar and left ventricular dilatation but did not show evidence of inducible ischemia.

## 2013-02-05 NOTE — Assessment & Plan Note (Signed)
Normal [facility] remote check June 24. Note one lengthy episodes of nonsustained ventricular tachycardia. Normal lead parameters and battery voltage. Frequent atrial pacing but virtually no ventricular pacing.

## 2013-02-05 NOTE — Assessment & Plan Note (Signed)
Despite severely depressed left ventricular ejection fraction he has excellent functional status (New York Heart Association functional class I). He appears to be clinically euvolemic. This is confirmed by normal optivol parameters. No change in chronic heart failure medications. He is on a small dose of loop diuretic as well as carvedilol spironolactone and nitrate. Lisinopril is no longer listed amongst his medications. Not sure if this has been inadvertently or purposely dropped.

## 2013-02-05 NOTE — Progress Notes (Signed)
Patient ID: Steven [name], male   DOB: 02/04/1957, 57 y.o.   MRN: [id number]     Reason for office visit ICD, CHF f/u  Serge is feeling quite well. He has not had any defibrillator discharges or significant shortness of breath. 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 He has not experienced syncope or near syncope or any defibrillator discharges. Irrigation of his device has consistently shown normal thoracic impedance readings over the last year. This fits with his NYHA functional class I status. Has not had problems with lower showed edema. His defibrillator did record one episode of nonsustained ventricular tachycardia consisting of 16 beats. This was asymptomatic    No Known Allergies  Current Outpatient Prescriptions  Medication Sig Dispense Refill  . aspirin 81 MG tablet Take 81 mg by mouth daily.      . carvedilol (COREG) 12.5 MG tablet Take 2 tablets (25 mg total) by mouth 2 (two) times daily with a meal.  60 tablet  6  . clopidogrel (PLAVIX) 75 MG tablet Take 1 tablet (75 mg total) by mouth daily.  30 tablet  6  . furosemide (LASIX) 40 MG tablet TAKE ONE TABLET BY MOUTH TWICE DAILY  60 tablet  6  . isosorbide mononitrate (IMDUR) 30 MG 24 hr tablet Take 1 tablet (30 mg total) by mouth daily.  30 tablet  6  . Multiple Vitamin (MULTIVITAMIN) tablet Take 1 tablet by mouth daily.      . nitroGLYCERIN (NITROSTAT) 0.4 MG SL tablet Place 0.4 mg under the tongue every 5 (five) minutes as needed.        . simvastatin (ZOCOR) 40 MG tablet Take 1 tablet (40 mg total) by mouth at bedtime.  30 tablet  11  . spironolactone (ALDACTONE) 25 MG tablet Take 0.5 tablets (12.5 mg total) by mouth 2 (two) times daily.  60 tablet  1   No current facility-administered medications for this visit.    Past Medical History  Diagnosis Date  . Acute MI, anterior wall   . Inguinal hernia, left   .  CAD (coronary artery disease)     2D ECHO, 07/13/2011 - EF <25%, LV moderatelty dilated, LA moderately dilatedLEXISCAN, 12/14/2011 - moderate-severe perfusion defect seen in the basal anteroseptal, mid anterior, apicacl anterior, apical, apical inferior, and apical lateral regions, post-stress EF 25%, new EKG changes from baseline abnormalities    Past Surgical History  Procedure Laterality Date  . Cardiac defibrillator placement  03/02/2011    Medtronic Protecta XT DR, model [id number], serial [id number]  . Cardiac catheterization  11/25/2010    Predilation balloon-Apex monorail 2x37mm, Cutting balloon-2.25x25mm, resulting in a reduction of 100% stenosis down to less than 10%  . Pacemaker placement  07/14/2010    Temporary placement of pacemaker, if rhythm issue continues will need a permanent device  . Cardiac catheterization  07/12/2010    LAD stented with a 3.5x72mm bare-metal Veriflex stent resulting in a reduction of 100% lesion to 0%    No family history on file.  History   Social History  . Marital Status: Married    Spouse Name: N/A    Number of Children: N/A  . Years of Education: N/A   Occupational History  . Not on file.   Social History Main Topics  . Smoking status: Former Smoker    Types: Cigarettes, Cigars  . Smokeless tobacco: Never Used  .  Alcohol Use: 1 - 1.5 oz/week    2-3 drink(s) per week  . Drug Use: No  . Sexual Activity: Not on file   Other Topics Concern  . Not on file   Social History Narrative  . No narrative on file    Review of systems: The patient specifically denies any chest pain at rest or with exertion, dyspnea at rest or with exertion, orthopnea, paroxysmal nocturnal dyspnea, syncope, palpitations, focal neurological deficits, intermittent claudication, lower extremity edema, unexplained weight gain, cough, hemoptysis or wheezing.  The patient also denies abdominal pain, nausea, vomiting, dysphagia, diarrhea, constipation, polyuria,  polydipsia, dysuria, hematuria, frequency, urgency, abnormal bleeding or bruising, fever, chills, unexpected weight changes, mood swings, change in skin or hair texture, change in voice quality, auditory or visual problems, allergic reactions or rashes, new musculoskeletal complaints other than usual "aches and pains".   PHYSICAL EXAM BP 132/78  Pulse 92  Ht 5\' 10"  (1.778 m)  Wt 181 lb 1.6 oz (82.146 kg)  BMI 25.99 kg/m2  General: Alert, oriented x3, no distress Head: no evidence of trauma, PERRL, EOMI, no exophtalmos or lid lag, no myxedema, no xanthelasma; normal ears, nose and oropharynx Neck: normal jugular venous pulsations and no hepatojugular reflux; brisk carotid pulses without delay and no carotid bruits Chest: clear to auscultation, no signs of consolidation by percussion or palpation, normal fremitus, symmetrical and full respiratory excursions, LV ICD site, left subclavian Cardiovascular: normal position and quality of the apical impulse, regular rhythm, normal first and second heart sounds, no murmurs, rubs or gallops Abdomen: no tenderness or distention, no masses by palpation, no abnormal pulsatility or arterial bruits, normal bowel sounds, no hepatosplenomegaly Extremities: no clubbing, cyanosis or edema; 2+ radial, ulnar and brachial pulses bilaterally; 2+ right femoral, posterior tibial and dorsalis pedis pulses; 2+ left femoral, posterior tibial and dorsalis pedis pulses; no subclavian or femoral bruits Neurological: grossly nonfocal   EKG: Atrial paced ventricular sensed chronic ST segment depression T wave inversion across the anterolateral and inferior leads. Delayed R-wave progression consistent with old anterior infarction. Not much change from previous tracings  Lipid Panel     Component Value Date/Time   CHOL 128 12/10/2012 1524   TRIG 70 12/10/2012 1524   HDL 38* 12/10/2012 1524   CHOLHDL 3.4 12/10/2012 1524   VLDL 14 12/10/2012 1524   LDLCALC 76 12/10/2012 1524     BMET    Component Value Date/Time   NA 135 12/13/2012 0906   K 4.1 12/13/2012 0906   CL 103 12/13/2012 0906   CO2 25 12/13/2012 0906   GLUCOSE 77 12/13/2012 0906   BUN 20 12/13/2012 0906   CREATININE 1.67* 12/13/2012 0906   CREATININE 1.45 11/27/2010 0500   CALCIUM 9.1 12/13/2012 0906   GFRNONAA 51* 11/27/2010 0500   GFRAA  Value: >60        The eGFR has been calculated using the MDRD equation. This calculation has not been validated in all clinical situations. eGFR's persistently <60 mL/min signify possible Chronic Kidney Disease. 11/27/2010 0500     ASSESSMENT AND PLAN Cardiomyopathy, ischemic Despite severely depressed left ventricular ejection fraction he has excellent functional status (New York Heart Association functional class I). He appears to be clinically euvolemic. This is confirmed by normal optivol parameters. No change in chronic heart failure medications. He is on a small dose of loop diuretic as well as carvedilol spironolactone and nitrate. Lisinopril is no longer listed amongst his medications. Not sure if this has been inadvertently or  purposely dropped.  VT (ventricular tachycardia) A 16 beat episode of nonsustained ventricular tachycardia recorded on device interrogation. This is asymptomatic.  Other and unspecified hyperlipidemia Generally satisfactory lipid profile with persistent low HDL cholesterol. He is encouraged to continue walking.  Coronary atherosclerosis of native coronary artery Large anterior wall STEMI January 2012, inferolateral non-STEMI June 2012, both treated with urgent but rather late angioplasty/stent secondary to delayed presentation. Currently angina free. The last nuclear study showed evidence of a large anterior scar and left ventricular dilatation but did not show evidence of inducible ischemia.  ICD  Medtronic protecta dual-chamber, implanted September 2012 primary prevention Normal [facility] remote check June 24. Note one lengthy episodes of  nonsustained ventricular tachycardia. Normal lead parameters and battery voltage. Frequent atrial pacing but virtually no ventricular pacing.  No orders of the defined types were placed in this encounter.   Meds ordered this encounter  Medications  . Multiple Vitamin (MULTIVITAMIN) tablet    Sig: Take 1 tablet by mouth daily.    Junious Silk, MD, [facility] [phone] office [phone] pager

## 2013-02-05 NOTE — Patient Instructions (Addendum)
Your physician recommends that you schedule a follow-up appointment in: 1 year We will continue to remotely monitor your device each month. Please call with any questions.

## 2013-02-05 NOTE — Assessment & Plan Note (Signed)
Generally satisfactory lipid profile with persistent low HDL cholesterol. He is encouraged to continue walking.

## 2013-02-05 NOTE — Assessment & Plan Note (Signed)
A 16 beat episode of nonsustained ventricular tachycardia recorded on device interrogation. This is asymptomatic.

## 2013-02-16 ENCOUNTER — Other Ambulatory Visit: Payer: Self-pay | Admitting: Cardiovascular Disease

## 2013-02-16 DIAGNOSIS — I428 Other cardiomyopathies: Secondary | ICD-10-CM

## 2013-02-16 LAB — ICD DEVICE OBSERVATION

## 2013-02-27 ENCOUNTER — Encounter: Payer: Self-pay | Admitting: Cardiovascular Disease

## 2013-02-27 ENCOUNTER — Ambulatory Visit (INDEPENDENT_AMBULATORY_CARE_PROVIDER_SITE_OTHER): Payer: Medicaid Other | Admitting: Cardiovascular Disease

## 2013-02-27 VITALS — BP 108/70 | HR 62 | Ht 70.0 in | Wt 185.2 lb

## 2013-02-27 DIAGNOSIS — Z95810 Presence of automatic (implantable) cardiac defibrillator: Secondary | ICD-10-CM

## 2013-02-27 DIAGNOSIS — I2589 Other forms of chronic ischemic heart disease: Secondary | ICD-10-CM

## 2013-02-27 DIAGNOSIS — I5189 Other ill-defined heart diseases: Secondary | ICD-10-CM

## 2013-02-27 DIAGNOSIS — I472 Ventricular tachycardia, unspecified: Secondary | ICD-10-CM

## 2013-02-27 DIAGNOSIS — I2510 Atherosclerotic heart disease of native coronary artery without angina pectoris: Secondary | ICD-10-CM

## 2013-02-27 DIAGNOSIS — I4729 Other ventricular tachycardia: Secondary | ICD-10-CM

## 2013-02-27 DIAGNOSIS — I255 Ischemic cardiomyopathy: Secondary | ICD-10-CM

## 2013-02-27 DIAGNOSIS — I519 Heart disease, unspecified: Secondary | ICD-10-CM

## 2013-02-27 NOTE — Progress Notes (Signed)
Patient ID: Steven [name], male   DOB: 02/04/1957, 57 y.o.   MRN: [id number]     HPI: Steven [name], is a 57 y.o. male who presents to the office today for cardiology evaluation. I last saw him in June 2014.  Steven [name] is a 57 year old gentleman originally from [place] who in January 2012 suffered a large out of hospital anterior wall myocardial infarction.  He presented after a three-hour delay to the hospital and was taken emergently to the cardiac catheterization laboratory where I performed successful emergent intervention with the door to balloon time of only 25 minutes. His LAD was successfully recanalized. CPK increased to 8012 with an MB of 716 and troponin was greater than 100 initially. He subsequently wore a life vest and ultimately after several months without significant improvement of LV function he underwent implantable cardio defibrillator insertion.   When I last saw him defibrillator was interrogated  and he   Had 5 episodes of nonsustained VT since 12/11/2011 with his last episode occurring on 11/05/2012. Maximum duration was 3 seconds and maximum weight 237 beats. All of these reverted spontaneously. At that time, I had further titrate his carvedilol from 12.5 twice a day, to 18.75 twice a day and subsequently to 25 mg twice a day. Apparently saw Dr. Levy Sjogren through last month and was also noted to have 116 beat episode of VT which was asymptomatic. There was some mention at that time that the patient was no longer taking lisinopril. Apparently the patient cannot recall when this was discontinued. I suspect this may have been due to renal insufficiency since when I review his laboratory over the past year his creatinine had risen and did subsequently improve off therapy. Presently, Steven [name] feels well. He is unaware of defibrillator discharge. He is unaware of tachycardia palpitations. He denies CHF symptoms. He denies chest pain   Past Medical History  Diagnosis Date  . Acute MI, anterior  wall   . Inguinal hernia, left   . CAD (coronary artery disease)     2D ECHO, 07/13/2011 - EF <25%, LV moderatelty dilated, LA moderately dilatedLEXISCAN, 12/14/2011 - moderate-severe perfusion defect seen in the basal anteroseptal, mid anterior, apicacl anterior, apical, apical inferior, and apical lateral regions, post-stress EF 25%, new EKG changes from baseline abnormalities    Past Surgical History  Procedure Laterality Date  . Cardiac defibrillator placement  03/02/2011    Medtronic Protecta XT DR, model [id number], serial [id number]  . Cardiac catheterization  11/25/2010    Predilation balloon-Apex monorail 2x19mm, Cutting balloon-2.25x19mm, resulting in a reduction of 100% stenosis down to less than 10%  . Pacemaker placement  07/14/2010    Temporary placement of pacemaker, if rhythm issue continues will need a permanent device  . Cardiac catheterization  07/12/2010    LAD stented with a 3.5x69mm bare-metal Veriflex stent resulting in a reduction of 100% lesion to 0%    No Known Allergies  Current Outpatient Prescriptions  Medication Sig Dispense Refill  . aspirin 81 MG tablet Take 81 mg by mouth daily.      . carvedilol (COREG) 12.5 MG tablet Take 2 tablets (25 mg total) by mouth 2 (two) times daily with a meal.  60 tablet  6  . clopidogrel (PLAVIX) 75 MG tablet Take 1 tablet (75 mg total) by mouth daily.  30 tablet  6  . furosemide (LASIX) 40 MG tablet TAKE ONE TABLET BY MOUTH TWICE DAILY  60 tablet  6  . isosorbide mononitrate (IMDUR) 30 MG  24 hr tablet Take 1 tablet (30 mg total) by mouth daily.  30 tablet  6  . Multiple Vitamin (MULTIVITAMIN) tablet Take 1 tablet by mouth daily.      . nitroGLYCERIN (NITROSTAT) 0.4 MG SL tablet Place 0.4 mg under the tongue every 5 (five) minutes as needed.        . simvastatin (ZOCOR) 40 MG tablet Take 1 tablet (40 mg total) by mouth at bedtime.  30 tablet  11  . spironolactone (ALDACTONE) 25 MG tablet Take 0.5 tablets (12.5 mg total) by mouth 2  (two) times daily.  60 tablet  1   No current facility-administered medications for this visit.   Social he is single. He has one child. He was born in [place]. There is a remote tobacco history. He does drink occasional alcohol.  ROS is negative for fever chills or night sweats he denies any awareness of palpitations. He denies PND orthopnea. He denies recent chest pain. He denies bleeding. He does have a history of an inguinal hernia he also has had issues with erectile dysfunction. He denies recent leg swelling. He denies bleeding. He denies paresthesias. He denies visual change. Other system review is negative.  PE BP 108/70  Pulse 62  Ht 5\' 10"  (1.778 m)  Wt 185 lb 3.2 oz (84.006 kg)  BMI 26.57 kg/m2  General: Alert, oriented, no distress.  HEENT: Normocephalic, atraumatic. Pupils round and reactive; sclera anicteric;no lid lag  Nose without nasal septal hypertrophy Mouth/Parynx benign; Mallinpatti scale 3 Neck: No JVD, no carotid briuts Lungs: clear to ausculatation and percussion; no wheezing or rales Heart: RRR, s1 s2 normal 1/6 systolic murmur. Abdomen: soft, nontender; no hepatosplenomehaly, BS+; abdominal aorta nontender and not dilated by palpation. Pulses 2+ Extremities: no clubbing cyanosis or edema, Homan's sign negative  Neurologic: grossly nonfocal  ECG: Atrial  paced rhythm at 64 beats per minute. Evidence for his prior anterolateral wall myocardial infarction with diffuse T-wave abnormality and precordial Q waves V1 through V5  LABS:  BMET    Component Value Date/Time   NA 135 12/13/2012 0906   K 4.1 12/13/2012 0906   CL 103 12/13/2012 0906   CO2 25 12/13/2012 0906   GLUCOSE 77 12/13/2012 0906   BUN 20 12/13/2012 0906   CREATININE 1.67* 12/13/2012 0906   CREATININE 1.45 11/27/2010 0500   CALCIUM 9.1 12/13/2012 0906   GFRNONAA 51* 11/27/2010 0500   GFRAA  Value: >60        The eGFR has been calculated using the MDRD equation. This calculation has not been validated in  all clinical situations. eGFR's persistently <60 mL/min signify possible Chronic Kidney Disease. 11/27/2010 0500     Hepatic Function Panel     Component Value Date/Time   PROT 6.9 12/13/2012 0906   ALBUMIN 4.0 12/13/2012 0906   AST 24 12/13/2012 0906   ALT 12 12/13/2012 0906   ALKPHOS 55 12/13/2012 0906   BILITOT 0.9 12/13/2012 0906   BILIDIR 0.1 11/23/2010 2007   IBILI 0.5 11/23/2010 2007     CBC    Component Value Date/Time   WBC 5.8 12/13/2012 0906   RBC 4.99 12/13/2012 0906   HGB 16.0 12/13/2012 0906   HCT 45.1 12/13/2012 0906   PLT 147* 12/13/2012 0906   MCV 90.4 12/13/2012 0906   MCH 32.1 12/13/2012 0906   MCHC 35.5 12/13/2012 0906   RDW 13.1 12/13/2012 0906   LYMPHSABS 1.8 11/23/2010 2007   MONOABS 2.0* 11/23/2010 2007   EOSABS 0.0  11/23/2010 2007   BASOSABS 0.0 11/23/2010 2007     BNP    Component Value Date/Time   PROBNP 9719.0* 11/23/2010 2006    Lipid Panel     Component Value Date/Time   CHOL 128 12/10/2012 1524   TRIG 70 12/10/2012 1524   HDL 38* 12/10/2012 1524   CHOLHDL 3.4 12/10/2012 1524   VLDL 14 12/10/2012 1524   LDLCALC 76 12/10/2012 1524     RADIOLOGY: No results found.    ASSESSMENT AND PLAN: From a cardiac standpoint, Steven. Steven [name] is now over 2-1/2 years following his large anterior wall myocardial infarction. He did undergo a subsequent  cardiac catheterization response on June 2012  which showed a patent stent in the LAD but he now had an occluded marginal branch of his LCX with collaterals. There also was 80% first diagonal stenosis in a small vessel and 40% RCA stenosis.He does have an ischemic cardiomyopathy. He has tolerated further titration of his carvedilol to 25 mg twice a day and remains asymptomatic. He's been documented to have some episodes of nonsustained ventricular tachycardia the last time his defibrillator was interrogated. Presently there are no signs of CHF. Previously had been on ACE inhibition with lisinopril. Most recently he has not been on  this. His most recent creatinine is now 1.67 which is improved from 1.99 in 2012. Presently he'll continue his current medical regimen. His last echo Doppler study was in January 2013. In January 2015 I'm scheduling him for a two-year followup echo Doppler study to reassess LV function and chamber dimensions. I will see him back in the office in February 2015 and prior to that completes the laboratory will be obtained.    Lennette Bihari, MD, [facility]  02/27/2013 3:27 PM

## 2013-02-27 NOTE — Patient Instructions (Signed)
Your physician has requested that you have an echocardiogram. Echocardiography is a painless test that uses sound waves to create images of your heart. It provides your doctor with information about the size and shape of your heart and how well your heart's chambers and valves are working. This procedure takes approximately one hour. There are no restrictions for this procedure. This will be done in January 2015.  Your physician recommends that you return for lab work in: January 2015.  Your physician recommends that you schedule a follow-up appointment in: February 2015.

## 2013-03-06 LAB — REMOTE ICD DEVICE
AL THRESHOLD: 0.5 V
ATRIAL PACING ICD: 70 pct
FVT: 0
PACEART VT: 0
RV LEAD IMPEDENCE ICD: 437 Ohm
TZON-0003AFLUTTER: 350.8 ms
TZON-0003VSLOWVT: 370.3 ms
TZON-0004VSLOWVT: 32
VENTRICULAR PACING ICD: 0 pct

## 2013-03-23 DIAGNOSIS — I509 Heart failure, unspecified: Secondary | ICD-10-CM

## 2013-03-23 DIAGNOSIS — I428 Other cardiomyopathies: Secondary | ICD-10-CM

## 2013-03-23 LAB — ICD DEVICE OBSERVATION

## 2013-03-24 ENCOUNTER — Ambulatory Visit: Payer: Medicaid Other

## 2013-03-26 ENCOUNTER — Encounter: Payer: Self-pay | Admitting: *Deleted

## 2013-03-26 LAB — REMOTE ICD DEVICE
AL AMPLITUDE: 1.8 mv
AL IMPEDENCE ICD: 437 Ohm
CHARGE TIME: 9.5 s
MODE SWITCH EPISODES: 0
RV LEAD AMPLITUDE: 11.3 mv
TZON-0003ATACH: 350.8 ms
VENTRICULAR PACING ICD: 0 pct

## 2013-03-28 ENCOUNTER — Encounter: Payer: Self-pay | Admitting: Cardiovascular Disease

## 2013-04-14 ENCOUNTER — Other Ambulatory Visit: Payer: Self-pay | Admitting: Cardiovascular Disease

## 2013-04-15 NOTE — Telephone Encounter (Signed)
Rx was sent to pharmacy electronically. 

## 2013-04-18 ENCOUNTER — Telehealth: Payer: Self-pay | Admitting: Cardiovascular Disease

## 2013-04-18 MED ORDER — CARVEDILOL 25 MG PO TABS: 25.0000 mg | ORAL_TABLET | Freq: Two times a day (BID) | ORAL | Status: DC

## 2013-04-18 NOTE — Telephone Encounter (Signed)
Returned call and pt verified x 2.  Pt informed message received and refill was sent in June 2014 w/ 6 refills to [facility].  Pt stated he is using that pharmacy and when he called two days ago they told him the needed to contact the office for refills.  Pt informed RN will call pharmacy to find out why refill not given as script was sent and call him back.  Pt verbalized understanding and agreed w/ plan.  Call to pharmacy and informed no refills left.  Stated last Rx received was 6.17.14 and pt has used all refills.  Stated Rx refilled: 6/17, 7/20, 8/5, 8/21, 9/5, 9/22, 10/7 and each time pt was given #60.  Informed RN will call pt.  Pt had been receiving #60 and needed #120 as he was taking 4 pills daily (two-12.5 mg tabs BID).  Call to pt and informed.  Offered to change Rx to one-25 mg tab BID and pt agreed.  Refill(s) sent to pharmacy.

## 2013-04-18 NOTE — Telephone Encounter (Signed)
His pharmacy has been having trouble getting his refill on car

## 2013-04-18 NOTE — Telephone Encounter (Signed)
His pharmacy is having trouble getting response from [place] on his carvedilol 12.5 rx  Needs refill please call

## 2013-04-27 LAB — ICD DEVICE OBSERVATION

## 2013-04-28 ENCOUNTER — Ambulatory Visit (INDEPENDENT_AMBULATORY_CARE_PROVIDER_SITE_OTHER): Payer: Medicaid Other

## 2013-04-28 DIAGNOSIS — I255 Ischemic cardiomyopathy: Secondary | ICD-10-CM

## 2013-04-28 DIAGNOSIS — I509 Heart failure, unspecified: Secondary | ICD-10-CM

## 2013-04-28 DIAGNOSIS — I2589 Other forms of chronic ischemic heart disease: Secondary | ICD-10-CM

## 2013-05-13 ENCOUNTER — Encounter: Payer: Self-pay | Admitting: *Deleted

## 2013-05-13 LAB — MDC_IDC_ENUM_SESS_TYPE_REMOTE
Brady Statistic AP VP Percent: 0.02 %
Brady Statistic AP VS Percent: 73.41 %
Brady Statistic AS VP Percent: 0.01 %
Brady Statistic AS VS Percent: 26.56 %
Brady Statistic RV Percent Paced: 0.03 %
HighPow Impedance: 399 Ohm
HighPow Impedance: 52 Ohm
HighPow Impedance: 72 Ohm
Lead Channel Pacing Threshold Amplitude: 0.5 V
Lead Channel Pacing Threshold Pulse Width: 0.4 ms
Lead Channel Pacing Threshold Pulse Width: 0.4 ms
Lead Channel Sensing Intrinsic Amplitude: 1.625 mV
Lead Channel Sensing Intrinsic Amplitude: 1.625 mV
Lead Channel Sensing Intrinsic Amplitude: 10 mV
Lead Channel Setting Pacing Amplitude: 1.5 V
Lead Channel Setting Pacing Amplitude: 2 V
Lead Channel Setting Pacing Pulse Width: 0.4 ms
Zone Setting Detection Interval: 300 ms
Zone Setting Detection Interval: 350 ms

## 2013-06-17 ENCOUNTER — Other Ambulatory Visit: Payer: Self-pay | Admitting: *Deleted

## 2013-06-17 ENCOUNTER — Encounter: Payer: Self-pay | Admitting: *Deleted

## 2013-06-17 DIAGNOSIS — I2510 Atherosclerotic heart disease of native coronary artery without angina pectoris: Secondary | ICD-10-CM

## 2013-06-30 ENCOUNTER — Encounter: Payer: Medicaid Other | Admitting: *Deleted

## 2013-07-02 LAB — NMR LIPOPROFILE WITH LIPIDS
Cholesterol, Total: 161 mg/dL (ref <200)
HDL Particle Number: 31.8 umol/L (ref >=30.5)
HDL Size: 8.8 nm — ABNORMAL LOW (ref >=9.2)
HDL-C: 43 mg/dL (ref >=40)
LDL (calc): 96 mg/dL (ref <100)
LDL Particle Number: 1916 nmol/L — ABNORMAL HIGH (ref <1000)
LDL Size: 19.8 nm — ABNORMAL LOW (ref >20.5)
LP-IR Score: 83 — ABNORMAL HIGH (ref <=45)
Large HDL-P: 2.7 umol/L — ABNORMAL LOW (ref >=4.8)
Large VLDL-P: 7.1 nmol/L — ABNORMAL HIGH (ref <=2.7)
Small LDL Particle Number: 1449 nmol/L — ABNORMAL HIGH (ref <=527)
Triglycerides: 108 mg/dL (ref <150)
VLDL Size: 62.8 nm — ABNORMAL HIGH (ref <=46.6)

## 2013-07-02 LAB — COMPREHENSIVE METABOLIC PANEL
ALT: 11 U/L (ref 0–53)
AST: 23 U/L (ref 0–37)
Albumin: 4.2 g/dL (ref 3.5–5.2)
Alkaline Phosphatase: 55 U/L (ref 39–117)
BUN: 19 mg/dL (ref 6–23)
CO2: 27 mEq/L (ref 19–32)
Calcium: 9.3 mg/dL (ref 8.4–10.5)
Chloride: 101 mEq/L (ref 96–112)
Creat: 1.63 mg/dL — ABNORMAL HIGH (ref 0.50–1.35)
Glucose, Bld: 110 mg/dL — ABNORMAL HIGH (ref 70–99)
Potassium: 4.6 mEq/L (ref 3.5–5.3)
Sodium: 138 mEq/L (ref 135–145)
Total Bilirubin: 0.7 mg/dL (ref 0.3–1.2)
Total Protein: 7.6 g/dL (ref 6.0–8.3)

## 2013-07-02 LAB — CBC
HCT: 44.5 % (ref 39.0–52.0)
Hemoglobin: 15.6 g/dL (ref 13.0–17.0)
MCH: 31.7 pg (ref 26.0–34.0)
MCHC: 35.1 g/dL (ref 30.0–36.0)
MCV: 90.4 fL (ref 78.0–100.0)
Platelets: 191 10*3/uL (ref 150–400)
RBC: 4.92 MIL/uL (ref 4.22–5.81)
RDW: 13.8 % (ref 11.5–15.5)
WBC: 7.7 10*3/uL (ref 4.0–10.5)

## 2013-07-02 LAB — TSH: TSH: 1.612 u[IU]/mL (ref 0.350–4.500)

## 2013-07-09 ENCOUNTER — Other Ambulatory Visit: Payer: Self-pay | Admitting: *Deleted

## 2013-07-09 MED ORDER — ISOSORBIDE MONONITRATE ER 30 MG PO TB24: 30.0000 mg | ORAL_TABLET | Freq: Every day | ORAL | Status: DC

## 2013-07-16 ENCOUNTER — Other Ambulatory Visit: Payer: Self-pay | Admitting: *Deleted

## 2013-07-16 MED ORDER — CLOPIDOGREL BISULFATE 75 MG PO TABS: 75.0000 mg | ORAL_TABLET | Freq: Every day | ORAL | Status: DC

## 2013-08-04 ENCOUNTER — Ambulatory Visit (HOSPITAL_COMMUNITY)
Admission: RE | Admit: 2013-08-04 | Discharge: 2013-08-04 | Disposition: A | Discharge status: Home / Self Care | Payer: Medicaid Other | Source: Ambulatory Visit | Attending: Cardiovascular Disease | Admitting: Cardiovascular Disease

## 2013-08-04 ENCOUNTER — Other Ambulatory Visit: Payer: Self-pay | Admitting: Physician Assistant

## 2013-08-04 DIAGNOSIS — I379 Nonrheumatic pulmonary valve disorder, unspecified: Secondary | ICD-10-CM

## 2013-08-04 DIAGNOSIS — Z95810 Presence of automatic (implantable) cardiac defibrillator: Secondary | ICD-10-CM | POA: Insufficient documentation

## 2013-08-04 DIAGNOSIS — I5189 Other ill-defined heart diseases: Secondary | ICD-10-CM

## 2013-08-04 DIAGNOSIS — I4729 Other ventricular tachycardia: Secondary | ICD-10-CM | POA: Insufficient documentation

## 2013-08-04 DIAGNOSIS — I472 Ventricular tachycardia, unspecified: Secondary | ICD-10-CM | POA: Insufficient documentation

## 2013-08-04 DIAGNOSIS — I428 Other cardiomyopathies: Secondary | ICD-10-CM | POA: Insufficient documentation

## 2013-08-04 DIAGNOSIS — I255 Ischemic cardiomyopathy: Secondary | ICD-10-CM

## 2013-08-04 NOTE — Progress Notes (Signed)
2D Echo Performed 08/04/2013    Marygrace Drought, RCS

## 2013-08-07 ENCOUNTER — Telehealth: Payer: Self-pay | Admitting: *Deleted

## 2013-08-07 MED ORDER — ATORVASTATIN CALCIUM 40 MG PO TABS: 40.0000 mg | ORAL_TABLET | Freq: Every day | ORAL | Status: DC

## 2013-08-07 NOTE — Telephone Encounter (Signed)
Message copied by Lauralee Evener on Thu Aug 07, 2013  1:32 PM ------      Message from: Shelva Majestic A      Created: Sun Aug 03, 2013  9:04 PM       Inc ldl_p despite simva 40.  Dc simva and change to atorvastatin 40 mg or add zetia 10 to simva if cannot take atorvastatin ------

## 2013-08-07 NOTE — Telephone Encounter (Signed)
Informed patient of lab results and recommendations. Patient wants to switch the simvastatin to atorvastatin instead of adding zetia. Atorvastatin 40 mg sent to [facility] on [address].

## 2013-08-12 ENCOUNTER — Other Ambulatory Visit: Payer: Self-pay | Admitting: Cardiovascular Disease

## 2013-08-12 NOTE — Telephone Encounter (Signed)
Rx was sent to pharmacy electronically. 

## 2013-08-18 ENCOUNTER — Ambulatory Visit (INDEPENDENT_AMBULATORY_CARE_PROVIDER_SITE_OTHER): Payer: Medicaid Other | Admitting: Cardiovascular Disease

## 2013-08-18 ENCOUNTER — Encounter: Payer: Self-pay | Admitting: Cardiovascular Disease

## 2013-08-18 VITALS — BP 120/86 | HR 64 | Ht 69.0 in | Wt 194.2 lb

## 2013-08-18 DIAGNOSIS — I2109 ST elevation (STEMI) myocardial infarction involving other coronary artery of anterior wall: Secondary | ICD-10-CM

## 2013-08-18 DIAGNOSIS — I513 Intracardiac thrombosis, not elsewhere classified: Secondary | ICD-10-CM | POA: Insufficient documentation

## 2013-08-18 DIAGNOSIS — I4729 Other ventricular tachycardia: Secondary | ICD-10-CM

## 2013-08-18 DIAGNOSIS — I472 Ventricular tachycardia, unspecified: Secondary | ICD-10-CM

## 2013-08-18 DIAGNOSIS — I255 Ischemic cardiomyopathy: Secondary | ICD-10-CM

## 2013-08-18 DIAGNOSIS — I2589 Other forms of chronic ischemic heart disease: Secondary | ICD-10-CM

## 2013-08-18 DIAGNOSIS — I2510 Atherosclerotic heart disease of native coronary artery without angina pectoris: Secondary | ICD-10-CM

## 2013-08-18 DIAGNOSIS — E785 Hyperlipidemia, unspecified: Secondary | ICD-10-CM | POA: Insufficient documentation

## 2013-08-18 DIAGNOSIS — Z7901 Long term (current) use of anticoagulants: Secondary | ICD-10-CM

## 2013-08-18 NOTE — Patient Instructions (Addendum)
  Your physician recommends that you schedule a follow-up appointment in: 6 months. Pt is to start coumadin and dc plavix and see Cyril Mourning in coumadin clinic.  Your physician has requested that you have an echocardiogram. Echocardiography is a painless test that uses sound waves to create images of your heart. It provides your doctor with information about the size and shape of your heart and how well your heart's chambers and valves are working. This procedure takes approximately one hour. There are no restrictions for this procedure. This will be done in 6 months.

## 2013-08-18 NOTE — Progress Notes (Signed)
Patient ID: Steven [name], male   DOB: 02/04/1957, 58 y.o.   MRN: [id number]     HPI: Steven [name] is a 58 y.o. male who presents to the office today for cardiology evaluation and in followup of his recent echo Doppler study.  Steven [name] is a 58 year old gentleman originally from [place] who  suffered a large out of hospital anterior wall myocardial infarction in January 2012. [name]  He presented after a three-hour delay to the hospital and was taken emergently to the cardiac catheterization laboratory where I performed successful emergent intervention with the door to balloon time of only 25 minutes. His LAD was successfully recanalized. CPK increased to 8012 with an MB of 716 and troponin was greater than 100 initially. He subsequently wore a life vest and ultimately after several months without significant improvement of LV function he underwent implantable cardio defibrillator insertion.   When I  saw year his defibrillator was interrogated  and he had 5 episodes of nonsustained VT since 12/11/2011 with his last episode occurring on 11/05/2012. Maximum duration was 3 seconds and maximum weight 237 beats. All of these reverted spontaneously. At that time, I further titrated his carvedilol from 12.5 twice a day, to 18.75 twice a day and subsequently to 25 mg twice a day. Apparently saw Dr. Sallyanne Kuster  and was also noted to have 116 beat episode of VT which was asymptomatic. There was some mention at that time that the patient was no longer taking lisinopril. Apparently the patient cannot recall when this was discontinued. I suspect this may have been due to renal insufficiency since when I review his laboratory over the past year his creatinine had risen and did subsequently improve off therapy. Presently, Steven [name] feels well. He is unaware of defibrillator discharge. He is unaware of tachycardia palpitations. He denies CHF symptoms. He denies chest pain. He just underwent a followup echo Doppler study on 08/04/2013.  This again showed an ejection fraction of 20-25% with diffuse hypokinesis but there was akinesis of the entire anteroseptal myocardium and apical region. There now appear to be an apparent medium-sized mural apical thrombus which was not noted on his last echo. He denies paresthesias. He denies episodes of chest pain. He denies shortness of breath.   Past Medical History  Diagnosis Date  . Acute MI, anterior wall   . Inguinal hernia, left   . CAD (coronary artery disease)     2D ECHO, 07/13/2011 - EF <25%, LV moderatelty dilated, LA moderately dilatedLEXISCAN, 12/14/2011 - moderate-severe perfusion defect seen in the basal anteroseptal, mid anterior, apicacl anterior, apical, apical inferior, and apical lateral regions, post-stress EF 25%, new EKG changes from baseline abnormalities    Past Surgical History  Procedure Laterality Date  . Cardiac defibrillator placement  03/02/2011    Medtronic Protecta XT DR, model [id number], serial [id number]  . Cardiac catheterization  11/25/2010    Predilation balloon-Apex monorail 2x29m, Cutting balloon-2.25x159m resulting in a reduction of 100% stenosis down to less than 10%  . Pacemaker placement  07/14/2010    Temporary placement of pacemaker, if rhythm issue continues will need a permanent device  . Cardiac catheterization  07/12/2010    LAD stented with a 3.5x2451mare-metal Veriflex stent resulting in a reduction of 100% lesion to 0%    No Known Allergies  Current Outpatient Prescriptions  Medication Sig Dispense Refill  . aspirin 81 MG tablet Take 81 mg by mouth daily.      . aMarland Kitchenorvastatin (LIPITOR) 40 MG tablet Take  1 tablet (40 mg total) by mouth daily.  30 tablet  6  . carvedilol (COREG) 25 MG tablet Take 1 tablet (25 mg total) by mouth 2 (two) times daily with a meal.  60 tablet  4  . clomiPHENE (CLOMID) 50 MG tablet Take 25 mg by mouth every other day.      . clopidogrel (PLAVIX) 75 MG tablet Take 1 tablet (75 mg total) by mouth daily.  30  tablet  6  . furosemide (LASIX) 40 MG tablet TAKE ONE TABLET BY MOUTH TWICE DAILY  60 tablet  7  . isosorbide mononitrate (IMDUR) 30 MG 24 hr tablet Take 1 tablet (30 mg total) by mouth daily.  30 tablet  6  . Multiple Vitamin (MULTIVITAMIN) tablet Take 1 tablet by mouth daily.      [name] NITROSTAT 0.4 MG SL tablet DISSOLVE ONE TABLET UNDER THE TONGUE EVERY 5 MINUTES AS NEEDED FOR CHEST PAIN.  DO NOT EXCEED A TOTAL OF 3 DOSES IN 15 MINUTES  25 tablet  1  . spironolactone (ALDACTONE) 25 MG tablet Take 0.5 tablets (12.5 mg total) by mouth 2 (two) times daily.  30 tablet  6   No current facility-administered medications for this visit.   Social he is single. He has one child. He was born in [place]. There is a remote tobacco history. He does drink occasional alcohol.  ROS is negative for fever chills or night sweats. He denies visual change or change in hearing. He is unaware of lymphadenopathy. He denies any awareness of palpitations or defibrillator discharge.[name] He denies PND orthopnea. He denies recent chest pain. He denies bleeding. He denies nausea vomiting or diarrhea. There is no blood in stool or urine He does have a history of an inguinal hernia he also has had issues with erectile dysfunction. He denies recent leg swelling. He denies bleeding. He denies paresthesias. There is no diabetes. Other comprehensive 14 point system review is negative.  PE BP 120/86  Pulse 64  Ht 5' 9"  (1.753 m)  Wt 194 lb 3.2 oz (88.089 kg)  BMI 28.67 kg/m2  General: Alert, oriented, no distress.  HEENT: Normocephalic, atraumatic. Pupils round and reactive; sclera anicteric;no lid lag  Nose without nasal septal hypertrophy Mouth/Parynx benign; Mallinpatti scale 3 Neck: No JVD, no carotid bruits Chest wall: No tenderness to palpation Lungs: clear to ausculatation and percussion; no wheezing or rales Heart: RRR, s1 s2 normal 1/6 systolic murmur. No S3 or S4 gallop. No rub thrill or heave. Abdomen: soft, nontender;  no hepatosplenomehaly, BS+; abdominal aorta nontender and not dilated by palpation. Back: No CVA tenderness Pulses 2+ Extremities: no clubbing cyanosis or edema, Homan's sign negative  Neurologic: grossly nonfocal; cranial nerves grossly normal Psychological: Normal affect and mood  ECG (independently read by me):  Atrially paced at 64 beats per minute. QTc interval 427 ms. QRS duration 98 ms. Diffuse anterolateral T-wave changes secondary to his prior MI   Prior ECG of 02/27/2013: Atrial  paced rhythm at 64 beats per minute. Evidence for his prior anterolateral wall myocardial infarction with diffuse T-wave abnormality and precordial Q waves V1 through V5  LABS:  BMET    Component Value Date/Time   NA 138 07/01/2013 0836   K 4.6 07/01/2013 0836   CL 101 07/01/2013 0836   CO2 27 07/01/2013 0836   GLUCOSE 110* 07/01/2013 0836   BUN 19 07/01/2013 0836   CREATININE 1.63* 07/01/2013 0836   CREATININE 1.45 11/27/2010 0500   CALCIUM 9.3 07/01/2013  [place]* 11/27/2010 0500   GFRAA  Value: >60        The eGFR has been calculated using the MDRD equation. This calculation has not been validated in all clinical situations. eGFR's persistently <60 mL/min signify possible Chronic Kidney Disease. 11/27/2010 0500     Hepatic Function Panel     Component Value Date/Time   PROT 7.6 07/01/2013 0836   ALBUMIN 4.2 07/01/2013 0836   AST 23 07/01/2013 0836   ALT 11 07/01/2013 0836   ALKPHOS 55 07/01/2013 0836   BILITOT 0.7 07/01/2013 0836   BILIDIR 0.1 11/23/2010 2007   IBILI 0.5 11/23/2010 2007     CBC    Component Value Date/Time   WBC 7.7 07/01/2013 0833   RBC 4.92 07/01/2013 0833   HGB 15.6 07/01/2013 0833   HCT 44.5 07/01/2013 0833   PLT 191 07/01/2013 0833   MCV 90.4 07/01/2013 0833   MCH 31.7 07/01/2013 0833   MCHC 35.1 07/01/2013 0833   RDW 13.8 07/01/2013 0833   LYMPHSABS 1.8 11/23/2010 2007   MONOABS 2.0* 11/23/2010 2007   EOSABS 0.0 11/23/2010 2007   BASOSABS 0.0 11/23/2010 2007     BNP    Component Value  Date/Time   PROBNP 9719.0* 11/23/2010 2006    Lipid Panel     Component Value Date/Time   CHOL 128 12/10/2012 1524   TRIG 108 07/01/2013 0838   TRIG 70 12/10/2012 1524   HDL 38* 12/10/2012 1524   CHOLHDL 3.4 12/10/2012 1524   VLDL 14 12/10/2012 1524   LDLCALC 96 07/01/2013 0838   LDLCALC 76 12/10/2012 1524     RADIOLOGY: No results found.    ASSESSMENT AND PLAN: Steven [name] is now  3 years following his large anterior wall myocardial infarction. He did undergo a subsequent  cardiac catheterization  on June 2012  which showed a patent stent in the LAD but he now had an occluded marginal branch of his LCX with collaterals. There also was 80% first diagonal stenosis in a small vessel and 40% RCA stenosis.He does have an ischemic cardiomyopathy. He has tolerated further titration of his carvedilol to 25 mg twice a day and remains asymptomatic. He's been documented to have some episodes of nonsustained ventricular tachycardia the last time his defibrillator was interrogated. Presently there are no signs of CHF. Previously had been on ACE inhibition with lisinopril. Most recently he has not been on this. His last one 07/01/2013 creatinine was 1.63 which was comparable to 1.67 back in June 2014. I did review his echo Doppler study in detail. 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 We discontinued ischemic cardiopathy and with evidence for thrombus development I now recommending initiation of Coumadin anticoagulation. He will discontinue his Plavix but continue his baby aspirin 81 mg with his concomitant CAD. I did discuss him in detail with Roselie Awkward. Pharm.D. who has started him on Coumadin today and we'll see him in Coumadin clinic in several days for further evaluation and assessment of the initial INR. Presently, he is compensated without CHF symptoms. His blood pressure is controlled. He's not having ectopy. I will see him in 6 months or cardiology  reassessment.   Troy Sine, MD, [facility]  08/18/2013 5:40 PM

## 2013-08-19 ENCOUNTER — Encounter: Payer: Medicaid Other | Admitting: Pharmacist Clinician (PhC)/ Clinical Pharmacy Specialist

## 2013-08-19 ENCOUNTER — Telehealth: Payer: Self-pay | Admitting: Pharmacist Clinician (PhC)/ Clinical Pharmacy Specialist

## 2013-08-19 MED ORDER — WARFARIN SODIUM 5 MG PO TABS: 5.0000 mg | ORAL_TABLET | Freq: Every day | ORAL | Status: DC

## 2013-08-19 NOTE — Telephone Encounter (Signed)
Spoke with patient, he is to pick up warfarin as soon as able (weather permitting) and start with 5mg  daily.  I will see him next Monday March 2 for new coumadin start appointment.  He is to stop clopidogrel once he starts the warfarin.  Pt voiced understanding.

## 2013-08-25 ENCOUNTER — Ambulatory Visit (INDEPENDENT_AMBULATORY_CARE_PROVIDER_SITE_OTHER): Payer: Medicaid Other | Admitting: Pharmacist Clinician (PhC)/ Clinical Pharmacy Specialist

## 2013-08-25 DIAGNOSIS — Z7901 Long term (current) use of anticoagulants: Secondary | ICD-10-CM

## 2013-08-25 DIAGNOSIS — I513 Intracardiac thrombosis, not elsewhere classified: Secondary | ICD-10-CM

## 2013-08-25 DIAGNOSIS — I2109 ST elevation (STEMI) myocardial infarction involving other coronary artery of anterior wall: Secondary | ICD-10-CM

## 2013-08-25 LAB — POCT INR: INR: 2.8

## 2013-08-26 ENCOUNTER — Telehealth: Payer: Self-pay | Admitting: *Deleted

## 2013-08-26 NOTE — Telephone Encounter (Signed)
Faxed cardiac clearance for colonoscopy.

## 2013-09-01 ENCOUNTER — Ambulatory Visit (INDEPENDENT_AMBULATORY_CARE_PROVIDER_SITE_OTHER): Payer: Medicaid Other | Admitting: Pharmacist Clinician (PhC)/ Clinical Pharmacy Specialist

## 2013-09-01 DIAGNOSIS — I2109 ST elevation (STEMI) myocardial infarction involving other coronary artery of anterior wall: Secondary | ICD-10-CM

## 2013-09-01 DIAGNOSIS — I513 Intracardiac thrombosis, not elsewhere classified: Secondary | ICD-10-CM

## 2013-09-01 DIAGNOSIS — Z7901 Long term (current) use of anticoagulants: Secondary | ICD-10-CM

## 2013-09-01 LAB — POCT INR: INR: 3.5

## 2013-09-12 ENCOUNTER — Ambulatory Visit: Payer: Medicaid Other | Admitting: Pharmacist Clinician (PhC)/ Clinical Pharmacy Specialist

## 2013-09-15 ENCOUNTER — Encounter: Payer: Self-pay | Admitting: *Deleted

## 2013-09-17 ENCOUNTER — Other Ambulatory Visit: Payer: Self-pay | Admitting: Cardiovascular Disease

## 2013-09-17 ENCOUNTER — Ambulatory Visit (INDEPENDENT_AMBULATORY_CARE_PROVIDER_SITE_OTHER): Payer: Medicaid Other | Admitting: Pharmacist Clinician (PhC)/ Clinical Pharmacy Specialist

## 2013-09-17 DIAGNOSIS — I2510 Atherosclerotic heart disease of native coronary artery without angina pectoris: Secondary | ICD-10-CM

## 2013-09-17 DIAGNOSIS — I2109 ST elevation (STEMI) myocardial infarction involving other coronary artery of anterior wall: Secondary | ICD-10-CM

## 2013-09-17 DIAGNOSIS — Z7901 Long term (current) use of anticoagulants: Secondary | ICD-10-CM

## 2013-09-17 DIAGNOSIS — I513 Intracardiac thrombosis, not elsewhere classified: Secondary | ICD-10-CM

## 2013-09-17 LAB — POCT INR: INR: 3.2

## 2013-09-17 NOTE — Telephone Encounter (Signed)
Rx was sent to pharmacy electronically. 

## 2013-09-19 ENCOUNTER — Telehealth: Payer: Self-pay | Admitting: Cardiovascular Disease

## 2013-09-19 NOTE — Telephone Encounter (Signed)
Please call concerning his pacemaker or defribillator,said he received a letter.Steven [name]

## 2013-09-19 NOTE — Telephone Encounter (Signed)
Verbal instructions given on how to send a manual transmission. Patient voiced understanding. 

## 2013-09-22 ENCOUNTER — Ambulatory Visit (INDEPENDENT_AMBULATORY_CARE_PROVIDER_SITE_OTHER): Payer: Medicaid Other | Admitting: *Deleted

## 2013-09-22 ENCOUNTER — Encounter: Payer: Self-pay | Admitting: Cardiovascular Disease

## 2013-09-22 DIAGNOSIS — I255 Ischemic cardiomyopathy: Secondary | ICD-10-CM

## 2013-09-22 DIAGNOSIS — I2589 Other forms of chronic ischemic heart disease: Secondary | ICD-10-CM

## 2013-09-22 DIAGNOSIS — Z95810 Presence of automatic (implantable) cardiac defibrillator: Secondary | ICD-10-CM

## 2013-09-29 LAB — MDC_IDC_ENUM_SESS_TYPE_REMOTE
Battery Voltage: 3.12 V
Brady Statistic AP VS Percent: 68.31 %
Brady Statistic AS VS Percent: 31.65 %
Brady Statistic RA Percent Paced: 68.33 %
Brady Statistic RV Percent Paced: 0.04 %
Date Time Interrogation Session: 20150330134030
HIGH POWER IMPEDANCE MEASURED VALUE: 19 Ohm
HighPow Impedance: 399 Ohm
HighPow Impedance: 48 Ohm
HighPow Impedance: 65 Ohm
Lead Channel Impedance Value: 399 Ohm
Lead Channel Impedance Value: 437 Ohm
Lead Channel Pacing Threshold Amplitude: 0.375 V
Lead Channel Pacing Threshold Pulse Width: 0.4 ms
Lead Channel Pacing Threshold Pulse Width: 0.4 ms
Lead Channel Setting Sensing Sensitivity: 0.3 mV
MDC IDC MSMT LEADCHNL RA SENSING INTR AMPL: 1.875 mV
MDC IDC MSMT LEADCHNL RV PACING THRESHOLD AMPLITUDE: 0.5 V
MDC IDC MSMT LEADCHNL RV SENSING INTR AMPL: 11.5 mV
MDC IDC SET LEADCHNL RA PACING AMPLITUDE: 1.5 V
MDC IDC SET LEADCHNL RV PACING AMPLITUDE: 2 V
MDC IDC SET LEADCHNL RV PACING PULSEWIDTH: 0.4 ms
MDC IDC SET ZONE DETECTION INTERVAL: 300 ms
MDC IDC SET ZONE DETECTION INTERVAL: 360 ms
MDC IDC STAT BRADY AP VP PERCENT: 0.02 %
MDC IDC STAT BRADY AS VP PERCENT: 0.02 %
Zone Setting Detection Interval: 350 ms
Zone Setting Detection Interval: 370 ms

## 2013-10-08 ENCOUNTER — Ambulatory Visit (INDEPENDENT_AMBULATORY_CARE_PROVIDER_SITE_OTHER): Payer: Medicaid Other | Admitting: Pharmacist Clinician (PhC)/ Clinical Pharmacy Specialist

## 2013-10-08 DIAGNOSIS — I513 Intracardiac thrombosis, not elsewhere classified: Secondary | ICD-10-CM

## 2013-10-08 DIAGNOSIS — I2109 ST elevation (STEMI) myocardial infarction involving other coronary artery of anterior wall: Secondary | ICD-10-CM

## 2013-10-08 DIAGNOSIS — Z7901 Long term (current) use of anticoagulants: Secondary | ICD-10-CM

## 2013-10-08 LAB — POCT INR: INR: 1.6

## 2013-10-17 ENCOUNTER — Encounter: Payer: Self-pay | Admitting: *Deleted

## 2013-10-31 ENCOUNTER — Ambulatory Visit (INDEPENDENT_AMBULATORY_CARE_PROVIDER_SITE_OTHER): Payer: Medicaid Other | Admitting: Pharmacist Clinician (PhC)/ Clinical Pharmacy Specialist

## 2013-10-31 DIAGNOSIS — Z7901 Long term (current) use of anticoagulants: Secondary | ICD-10-CM

## 2013-10-31 DIAGNOSIS — I513 Intracardiac thrombosis, not elsewhere classified: Secondary | ICD-10-CM

## 2013-10-31 DIAGNOSIS — I2109 ST elevation (STEMI) myocardial infarction involving other coronary artery of anterior wall: Secondary | ICD-10-CM

## 2013-10-31 LAB — POCT INR: INR: 3.5

## 2013-11-10 ENCOUNTER — Other Ambulatory Visit: Payer: Self-pay | Admitting: *Deleted

## 2013-11-10 MED ORDER — SPIRONOLACTONE 25 MG PO TABS
12.5000 mg | ORAL_TABLET | Freq: Two times a day (BID) | ORAL | Status: DC
Start: 2013-11-10 — End: 2014-09-14

## 2013-11-10 NOTE — Telephone Encounter (Signed)
Rx was sent to pharmacy electronically. 

## 2013-11-21 ENCOUNTER — Ambulatory Visit (INDEPENDENT_AMBULATORY_CARE_PROVIDER_SITE_OTHER): Payer: Medicaid Other | Admitting: Pharmacist Clinician (PhC)/ Clinical Pharmacy Specialist

## 2013-11-21 DIAGNOSIS — I2109 ST elevation (STEMI) myocardial infarction involving other coronary artery of anterior wall: Secondary | ICD-10-CM

## 2013-11-21 DIAGNOSIS — Z7901 Long term (current) use of anticoagulants: Secondary | ICD-10-CM

## 2013-11-21 DIAGNOSIS — I513 Intracardiac thrombosis, not elsewhere classified: Secondary | ICD-10-CM

## 2013-11-21 LAB — POCT INR: INR: 2.3

## 2013-12-17 ENCOUNTER — Ambulatory Visit (INDEPENDENT_AMBULATORY_CARE_PROVIDER_SITE_OTHER): Payer: Medicaid Other | Admitting: Pharmacist Clinician (PhC)/ Clinical Pharmacy Specialist

## 2013-12-17 DIAGNOSIS — I513 Intracardiac thrombosis, not elsewhere classified: Secondary | ICD-10-CM

## 2013-12-17 DIAGNOSIS — Z7901 Long term (current) use of anticoagulants: Secondary | ICD-10-CM

## 2013-12-17 DIAGNOSIS — I2109 ST elevation (STEMI) myocardial infarction involving other coronary artery of anterior wall: Secondary | ICD-10-CM

## 2013-12-17 LAB — POCT INR: INR: 3.1

## 2013-12-29 ENCOUNTER — Ambulatory Visit (INDEPENDENT_AMBULATORY_CARE_PROVIDER_SITE_OTHER): Payer: Medicaid Other | Admitting: *Deleted

## 2013-12-29 DIAGNOSIS — I2589 Other forms of chronic ischemic heart disease: Secondary | ICD-10-CM

## 2013-12-29 DIAGNOSIS — Z95810 Presence of automatic (implantable) cardiac defibrillator: Secondary | ICD-10-CM

## 2013-12-29 DIAGNOSIS — I255 Ischemic cardiomyopathy: Secondary | ICD-10-CM

## 2013-12-29 NOTE — Progress Notes (Signed)
Remote ICD transmission.   

## 2014-01-04 LAB — MDC_IDC_ENUM_SESS_TYPE_REMOTE
Battery Voltage: 3.13 V
Brady Statistic AP VP Percent: 0.02 %
Brady Statistic AP VS Percent: 75.52 %
Brady Statistic AS VP Percent: 0.01 %
Brady Statistic AS VS Percent: 24.45 %
Brady Statistic RV Percent Paced: 0.03 %
Date Time Interrogation Session: 20150706082407
HighPow Impedance: 19 Ohm
HighPow Impedance: 437 Ohm
HighPow Impedance: 52 Ohm
HighPow Impedance: 72 Ohm
Lead Channel Impedance Value: 437 Ohm
Lead Channel Pacing Threshold Amplitude: 0.5 V
Lead Channel Pacing Threshold Amplitude: 0.5 V
Lead Channel Pacing Threshold Pulse Width: 0.4 ms
Lead Channel Pacing Threshold Pulse Width: 0.4 ms
Lead Channel Sensing Intrinsic Amplitude: 9.25 mV
Lead Channel Sensing Intrinsic Amplitude: 9.25 mV
Lead Channel Setting Pacing Amplitude: 2 V
Lead Channel Setting Pacing Pulse Width: 0.4 ms
Lead Channel Setting Sensing Sensitivity: 0.3 mV
MDC IDC MSMT LEADCHNL RA SENSING INTR AMPL: 1.625 mV
MDC IDC MSMT LEADCHNL RA SENSING INTR AMPL: 1.625 mV
MDC IDC MSMT LEADCHNL RV IMPEDANCE VALUE: 437 Ohm
MDC IDC SET LEADCHNL RA PACING AMPLITUDE: 1.5 V
MDC IDC SET ZONE DETECTION INTERVAL: 300 ms
MDC IDC STAT BRADY RA PERCENT PACED: 75.54 %
Zone Setting Detection Interval: 350 ms
Zone Setting Detection Interval: 360 ms
Zone Setting Detection Interval: 370 ms

## 2014-01-13 ENCOUNTER — Encounter: Payer: Self-pay | Admitting: Cardiology

## 2014-01-15 ENCOUNTER — Ambulatory Visit (INDEPENDENT_AMBULATORY_CARE_PROVIDER_SITE_OTHER): Payer: Medicaid Other | Admitting: Pharmacist Clinician (PhC)/ Clinical Pharmacy Specialist

## 2014-01-15 DIAGNOSIS — I2109 ST elevation (STEMI) myocardial infarction involving other coronary artery of anterior wall: Secondary | ICD-10-CM

## 2014-01-15 DIAGNOSIS — I513 Intracardiac thrombosis, not elsewhere classified: Secondary | ICD-10-CM

## 2014-01-15 DIAGNOSIS — Z7901 Long term (current) use of anticoagulants: Secondary | ICD-10-CM

## 2014-01-15 LAB — POCT INR: INR: 1.9

## 2014-01-29 ENCOUNTER — Encounter: Payer: Self-pay | Admitting: Cardiovascular Disease

## 2014-02-03 ENCOUNTER — Other Ambulatory Visit: Payer: Self-pay | Admitting: Cardiovascular Disease

## 2014-02-03 NOTE — Telephone Encounter (Signed)
Rx was sent to pharmacy electronically. 

## 2014-02-25 ENCOUNTER — Encounter: Payer: Medicaid Other | Admitting: Cardiovascular Disease

## 2014-03-03 ENCOUNTER — Ambulatory Visit: Payer: Medicaid Other | Admitting: Pharmacist Clinician (PhC)/ Clinical Pharmacy Specialist

## 2014-03-03 ENCOUNTER — Ambulatory Visit (INDEPENDENT_AMBULATORY_CARE_PROVIDER_SITE_OTHER): Payer: Medicaid Other | Admitting: Cardiovascular Disease

## 2014-03-03 VITALS — BP 84/60 | HR 63 | Ht 69.0 in | Wt 186.0 lb

## 2014-03-03 DIAGNOSIS — I2109 ST elevation (STEMI) myocardial infarction involving other coronary artery of anterior wall: Secondary | ICD-10-CM

## 2014-03-03 DIAGNOSIS — Z8679 Personal history of other diseases of the circulatory system: Secondary | ICD-10-CM

## 2014-03-03 DIAGNOSIS — E785 Hyperlipidemia, unspecified: Secondary | ICD-10-CM

## 2014-03-03 DIAGNOSIS — Z95810 Presence of automatic (implantable) cardiac defibrillator: Secondary | ICD-10-CM

## 2014-03-03 DIAGNOSIS — I2510 Atherosclerotic heart disease of native coronary artery without angina pectoris: Secondary | ICD-10-CM

## 2014-03-03 DIAGNOSIS — I513 Intracardiac thrombosis, not elsewhere classified: Secondary | ICD-10-CM

## 2014-03-03 DIAGNOSIS — I2589 Other forms of chronic ischemic heart disease: Secondary | ICD-10-CM

## 2014-03-03 DIAGNOSIS — I255 Ischemic cardiomyopathy: Secondary | ICD-10-CM

## 2014-03-03 DIAGNOSIS — Z9889 Other specified postprocedural states: Secondary | ICD-10-CM

## 2014-03-03 MED ORDER — LISINOPRIL 5 MG PO TABS: 5.0000 mg | ORAL_TABLET | Freq: Every day | ORAL | Status: DC

## 2014-03-03 NOTE — Patient Instructions (Signed)
Your physician recommends that you schedule a follow-up appointment in: [place] has recommended you make the following change in your medication: Decrease Lisinopril to 5 mg daily, Take Lasix 40 mg daily  Your physician has requested that you have an echocardiogram. Echocardiography is a painless test that uses sound waves to create images of your heart. It provides your doctor with information about the size and shape of your heart and how well your heart's chambers and valves are working. This procedure takes approximately one hour. There are no restrictions for this procedure.

## 2014-03-04 ENCOUNTER — Ambulatory Visit: Payer: Medicaid Other | Admitting: Pharmacist Clinician (PhC)/ Clinical Pharmacy Specialist

## 2014-03-04 ENCOUNTER — Encounter: Payer: Self-pay | Admitting: Cardiovascular Disease

## 2014-03-04 DIAGNOSIS — Z9889 Other specified postprocedural states: Secondary | ICD-10-CM

## 2014-03-04 DIAGNOSIS — Z8679 Personal history of other diseases of the circulatory system: Secondary | ICD-10-CM | POA: Insufficient documentation

## 2014-03-04 NOTE — Progress Notes (Signed)
Patient ID: Steven [name], male   DOB: 02/04/1957, 58 y.o.   MRN: [id number]     HPI: Steven [name] is a 58 y.o. male who presents to the office today for cardiology evaluation in followup of his recent subdural hematoma and evacuation done at [facility].  Steven [name] is a 58 year old gentleman originally from [place] who  suffered a large out of hospital anterior wall myocardial infarction in January 2012. [name]  He presented after a three-hour delay to the hospital and was taken emergently to the cardiac catheterization laboratory where I performed successful emergent intervention with the door to balloon time of only 25 minutes. His LAD was successfully recanalized. CPK increased to 8012 with an MB of 716 and troponin was greater than 100 initially. He subsequently wore a life vest and ultimately after several months without significant improvement of LV function he underwent implantable cardio defibrillator insertion.   When I saw him last year  his defibrillator was interrogated  and he had 5 episodes of nonsustained VT since 12/11/2011 with his last episode occurring on 11/05/2012. Maximum duration was 3 seconds and maximum weight 237 beats. All of these reverted spontaneously. At that time, I further titrated his carvedilol from 12.5 twice a day, to 18.75 twice a day and subsequently to 25 mg twice a day. Apparently saw Dr. Sallyanne Kuster  and was also noted to have 116 beat episode of VT which was asymptomatic. There was some mention at that time that the patient was no longer taking lisinopril. Apparently the patient cannot recall when this was discontinued. I suspect this may have been due to renal insufficiency since when I review his laboratory over the past year his creatinine had risen and did subsequently improve off therapy.  Presently, Steven [name] feels well. He is unaware of defibrillator discharge. He is unaware of tachycardia palpitations. He denies CHF symptoms. He denies chest pain.  A  followup echo Doppler study on 08/04/2013 showed an ejection fraction of 20-25% with diffuse hypokinesis but there was akinesis of the entire anteroseptal myocardium and apical region. There now appear to be an apparent medium-sized mural apical thrombus which was not noted on his last echo. He denies paresthesias. He denies episodes of chest pain. He denies shortness of breath.At his last office visit in February in light of his moderate sized mural thrombus, Plavix, was discontinued, and he was started on Coumadin therapy.  Steven., had been doing well, but apparently several weeks ago, he tripped and hit his head on a bar recently developed severe headache, transient leg weakness, which evolves into slurred speech.  He ultimately sought medical attention approximately a week and a half later in [facility], and he was found to have a subdural hematoma.  He underwent a right burr hole subdural hematoma evacuation on 02/05/2014 by Steven [name] at [facility].  Since that time his aspirin and Coumadin have been on hold.  He is scheduled for followup CT scan on 03/09/2014 and will be seeing Steven [name] in followup.  He presents today for followup cardiology evaluation.  He denies any chest pain.  He has noticed some episodes of dizziness.  He tells me he blacked out approximately 2 weeks ago.  He is unaware of any defibrillator discharge.   Past Medical History  Diagnosis Date  . Acute MI, anterior wall   . Inguinal hernia, left   . CAD (coronary artery disease)     2D ECHO, 07/13/2011 - EF <25%,  LV moderatelty dilated, LA moderately dilatedLEXISCAN, 12/14/2011 - moderate-severe perfusion defect seen in the basal anteroseptal, mid anterior, apicacl anterior, apical, apical inferior, and apical lateral regions, post-stress EF 25%, new EKG changes from baseline abnormalities    Past Surgical History  Procedure Laterality Date  . Cardiac defibrillator placement  03/02/2011    Medtronic Protecta XT  DR, model [id number], serial [id number]  . Cardiac catheterization  11/25/2010    Predilation balloon-Apex monorail 2x65m, Cutting balloon-2.25x173m resulting in a reduction of 100% stenosis down to less than 10%  . Pacemaker placement  07/14/2010    Temporary placement of pacemaker, if rhythm issue continues will need a permanent device  . Cardiac catheterization  07/12/2010    LAD stented with a 3.5x2455mare-metal Veriflex stent resulting in a reduction of 100% lesion to 0%    No Known Allergies  Current Outpatient Prescriptions  Medication Sig Dispense Refill  . aspirin 81 MG tablet Take 81 mg by mouth daily.      . aMarland Kitchenorvastatin (LIPITOR) 40 MG tablet Take 1 tablet (40 mg total) by mouth daily.  30 tablet  6  . carvedilol (COREG) 25 MG tablet TAKE ONE TABLET BY MOUTH TWICE DAILY WITH MEALS  60 tablet  11  . clomiPHENE (CLOMID) 50 MG tablet Take 25 mg by mouth every other day.      . furosemide (LASIX) 40 MG tablet TAKE ONE TABLET BY MOUTH  DAILY      . isosorbide mononitrate (IMDUR) 30 MG 24 hr tablet TAKE ONE TABLET BY MOUTH ONCE DAILY  30 tablet  5  . Multiple Vitamin (MULTIVITAMIN) tablet Take 1 tablet by mouth daily.      . NMarland KitchenTROSTAT 0.4 MG SL tablet DISSOLVE ONE TABLET UNDER THE TONGUE EVERY 5 MINUTES AS NEEDED FOR CHEST PAIN.  DO NOT EXCEED A TOTAL OF 3 DOSES IN 15 MINUTES  25 tablet  1  . spironolactone (ALDACTONE) 25 MG tablet Take 0.5 tablets (12.5 mg total) by mouth 2 (two) times daily.  30 tablet  7  . warfarin (COUMADIN) 5 MG tablet Take 1 tablet (5 mg total) by mouth daily.  30 tablet  3  . folic acid (FOLVITE) 1 MG tablet Take 1 mg by mouth daily.      . lMarland Kitchensinopril (PRINIVIL,ZESTRIL) 5 MG tablet Take 1 tablet (5 mg total) by mouth daily.  30 tablet  6   No current facility-administered medications for this visit.   Social he is single. He has one child. [place]. He does drink occasional alcohol.   ROS General: Negative;  No fevers, chills, or night sweats;  HEENT: Negative; No changes in vision or hearing, sinus congestion, difficulty swallowing Pulmonary: Negative; No cough, wheezing, shortness of breath, hemoptysis Cardiovascular: Positive for dizziness; No chest pain, presyncope, syncope, palpitations GI: Negative; No nausea, vomiting, diarrhea, or abdominal pain GU: Positive for erectile dysfunction.; No dysuria, hematuria, or difficulty voiding Musculoskeletal: Positive for history of an inguinal hernia; no myalgias, joint pain, or weakness Hematologic/Oncology: Negative; no easy bruising, bleeding Endocrine: Negative; no heat/cold intolerance; no diabetes Neuro: Status post recent subdural hematoma evacuation Skin: Negative; No rashes or skin lesions Psychiatric: Negative; No behavioral problems, depression Sleep: Negative; No snoring, daytime sleepiness, hypersomnolence, bruxism, restless legs, hypnogognic hallucinations, no cataplexy Other comprehensive 14 point system review is negative.  PE BP 84/60  Pulse 63  Ht _0  (1.753 m)  Wt 186 lb (84.369 kg)  BMI 27.45  kg/m2  Repeat blood pressure by me was 100/60, supine, and 100/60, standing.   General: Alert, oriented, no distress.  HEENT: Normocephalic, atraumatic. Pupils round and reactive; sclera anicteric;no lid lag  Nose without nasal septal hypertrophy Mouth/Parynx benign; Mallinpatti scale 3 Neck: No JVD, no carotid bruits Chest wall: No tenderness to palpation Lungs: clear to ausculatation and percussion; no wheezing or rales Heart: RRR, s1 s2 normal 1/6 systolic murmur. No S3 or S4 gallop. No rub thrill or heave. Abdomen: soft, nontender; no hepatosplenomehaly, BS+; abdominal aorta nontender and not dilated by palpation. Back: No CVA tenderness Pulses 2+ Extremities: no clubbing cyanosis or edema, Homan's sign negative  Neurologic: grossly nonfocal; cranial nerves grossly normal Psychological: Normal affect and mood  .  ECG  (independently read by me and (: Sinus rhythm at 63 beats per minute.  Old anterior wall myocardial infarction, poor R. progression V1 through V4 and T-wave changes V3 through V6, leads 1, and L.    prior 08/18/2013 ECG (independently read by me):  Atrially paced at 64 beats per minute. QTc interval 427 ms. QRS duration 98 ms. Diffuse anterolateral T-wave changes secondary to his prior MI   Prior ECG of 02/27/2013: Atrial  paced rhythm at 64 beats per minute. Evidence for his prior anterolateral wall myocardial infarction with diffuse T-wave abnormality and precordial Q waves V1 through V5  LABS:  BMET    Component Value Date/Time   NA 138 07/01/2013 0836   K 4.6 07/01/2013 0836   CL 101 07/01/2013 0836   CO2 27 07/01/2013 0836   GLUCOSE 110* 07/01/2013 0836   BUN 19 07/01/2013 0836   CREATININE 1.63* 07/01/2013 0836   CREATININE 1.45 11/27/2010 0500   CALCIUM 9.3 07/01/2013 0836   GFRNONAA 51* 11/27/2010 0500   GFRAA  Value: >60        The eGFR has been calculated using the MDRD equation. This calculation has not been validated in all clinical situations. eGFR's persistently <60 mL/min signify possible Chronic Kidney Disease. 11/27/2010 0500     Hepatic Function Panel     Component Value Date/Time   PROT 7.6 07/01/2013 0836   ALBUMIN 4.2 07/01/2013 0836   AST 23 07/01/2013 0836   ALT 11 07/01/2013 0836   ALKPHOS 55 07/01/2013 0836   BILITOT 0.7 07/01/2013 0836   BILIDIR 0.1 11/23/2010 2007   IBILI 0.5 11/23/2010 2007     CBC    Component Value Date/Time   WBC 7.7 07/01/2013 0833   RBC 4.92 07/01/2013 0833   HGB 15.6 07/01/2013 0833   HCT 44.5 07/01/2013 0833   PLT 191 07/01/2013 0833   MCV 90.4 07/01/2013 0833   MCH 31.7 07/01/2013 0833   MCHC 35.1 07/01/2013 0833   RDW 13.8 07/01/2013 0833   LYMPHSABS 1.8 11/23/2010 2007   MONOABS 2.0* 11/23/2010 2007   EOSABS 0.0 11/23/2010 2007   BASOSABS 0.0 11/23/2010 2007     BNP    Component Value Date/Time   PROBNP 9719.0* 11/23/2010 2006    Lipid Panel       Component Value Date/Time   CHOL 128 12/10/2012 1524   TRIG 108 07/01/2013 0838   TRIG 70 12/10/2012 1524   HDL 38* 12/10/2012 1524   CHOLHDL 3.4 12/10/2012 1524   VLDL 14 12/10/2012 1524   LDLCALC 96 07/01/2013 0838   LDLCALC 76 12/10/2012 1524     RADIOLOGY: No results found.    ASSESSMENT AND PLAN: Steven. Weible is now  3 years following his  large anterior wall myocardial infarction. He did undergo a subsequent  cardiac catheterization  on June 2012  which showed a patent stent in the LAD but he now had an occluded marginal branch of his LCX with collaterals. There also was 80% first diagonal stenosis in a small vessel and 40% RCA stenosis.He does have an ischemic cardiomyopathy. He has tolerated further titration of his carvedilol to 25 mg twice a day and remains asymptomatic. He has been documented to have some episodes of nonsustained ventricular tachycardia. Presently there are no signs of CHF.with his echo demonstrating a moderate mural thrombus.  He was started on Coumadin therapy in February 2015.  He sustained head trauma several weeks ago, resulting in a moderate subdural hematoma requiring evacuation.  He's now been off aspirin and Coumadin.  His blood pressure today is low, but he is not orthostatic.  I recommended he reduce his Lasix, which he currently is taking 40 mg twice a day to once a day and he will reduce his lisinopril from 10 mg to 5 mg.  Since he had been on Coumadin for approximately 6 months before was just discontinued following his traumatic head injury, I am recommending a repeat echo Doppler study to assess his LV function and apical thrombus.  He will undergo a followup head CT next week and neurosurgical reevaluation.  I will see him in the office in 6 weeks for reevaluation.   Troy Sine, MD, [facility]  03/04/2014 2:36 PM

## 2014-03-09 ENCOUNTER — Telehealth: Payer: Self-pay | Admitting: Cardiovascular Disease

## 2014-03-09 NOTE — Telephone Encounter (Signed)
Steven [name] called stating that it is ok for the pt to start back on coumadin. She stated that she will be faxing over the note.   Thanks

## 2014-03-10 NOTE — Telephone Encounter (Signed)
Note received. Will give to Dr. Claiborne Billings for review and recommendations.

## 2014-03-13 ENCOUNTER — Ambulatory Visit (HOSPITAL_COMMUNITY)
Admission: RE | Admit: 2014-03-13 | Discharge: 2014-03-13 | Disposition: A | Discharge status: Home / Self Care | Payer: Medicaid Other | Source: Ambulatory Visit | Attending: Cardiology | Admitting: Cardiology

## 2014-03-13 DIAGNOSIS — I2510 Atherosclerotic heart disease of native coronary artery without angina pectoris: Secondary | ICD-10-CM | POA: Diagnosis not present

## 2014-03-13 DIAGNOSIS — I079 Rheumatic tricuspid valve disease, unspecified: Secondary | ICD-10-CM | POA: Diagnosis not present

## 2014-03-13 DIAGNOSIS — I513 Intracardiac thrombosis, not elsewhere classified: Secondary | ICD-10-CM

## 2014-03-13 DIAGNOSIS — I2589 Other forms of chronic ischemic heart disease: Secondary | ICD-10-CM | POA: Insufficient documentation

## 2014-03-13 DIAGNOSIS — I255 Ischemic cardiomyopathy: Secondary | ICD-10-CM

## 2014-03-13 DIAGNOSIS — I517 Cardiomegaly: Secondary | ICD-10-CM

## 2014-03-13 DIAGNOSIS — I252 Old myocardial infarction: Secondary | ICD-10-CM | POA: Diagnosis not present

## 2014-03-13 DIAGNOSIS — I2109 ST elevation (STEMI) myocardial infarction involving other coronary artery of anterior wall: Secondary | ICD-10-CM | POA: Insufficient documentation

## 2014-03-13 NOTE — Progress Notes (Signed)
2D Echocardiogram Complete.  03/13/2014   Steven [name], [place]

## 2014-03-19 ENCOUNTER — Telehealth: Payer: Self-pay | Admitting: *Deleted

## 2014-03-19 NOTE — Telephone Encounter (Addendum)
Spoke with Steven [name] regarding recent echo results. He reports he was recently seen by dr freund and was given the okay to restart warfarin. Because the thrombus was not seen on recent echo he questioned if he needs to restart. Office note from dr freund reviewed by dr Claiborne Billings. Due to the patients EF% 15-20 he does need to be on warfarin to prevent future clots. Dr Claiborne Billings would like kristin alvstad pharm md to help with the restarting of the warfarin. Erasmo Downer is not in the office today will discuss with her tomorrow. Steven [name] aware kristin will call tomorrow.

## 2014-03-23 NOTE — Telephone Encounter (Signed)
Spoke with patient, advised to restart warfarin today at 2.5mg  daily.  Previous dose was 2.5mg  daily x 5mg  MF  Appointment set for 1 week.  Will monitor closely for INR to come up slowly, without bolus dosing.

## 2014-03-24 ENCOUNTER — Encounter: Payer: Self-pay | Admitting: Cardiovascular Disease

## 2014-03-24 ENCOUNTER — Ambulatory Visit (INDEPENDENT_AMBULATORY_CARE_PROVIDER_SITE_OTHER): Payer: Medicaid Other | Admitting: Cardiovascular Disease

## 2014-03-24 VITALS — BP 138/62 | HR 72 | Resp 16 | Ht 69.0 in | Wt 187.0 lb

## 2014-03-24 DIAGNOSIS — Z9889 Other specified postprocedural states: Secondary | ICD-10-CM

## 2014-03-24 DIAGNOSIS — I509 Heart failure, unspecified: Secondary | ICD-10-CM

## 2014-03-24 DIAGNOSIS — I2589 Other forms of chronic ischemic heart disease: Secondary | ICD-10-CM

## 2014-03-24 DIAGNOSIS — I472 Ventricular tachycardia, unspecified: Secondary | ICD-10-CM

## 2014-03-24 DIAGNOSIS — I255 Ischemic cardiomyopathy: Secondary | ICD-10-CM

## 2014-03-24 DIAGNOSIS — Z8679 Personal history of other diseases of the circulatory system: Secondary | ICD-10-CM

## 2014-03-24 DIAGNOSIS — Z95810 Presence of automatic (implantable) cardiac defibrillator: Secondary | ICD-10-CM

## 2014-03-24 DIAGNOSIS — I2510 Atherosclerotic heart disease of native coronary artery without angina pectoris: Secondary | ICD-10-CM

## 2014-03-24 DIAGNOSIS — I4729 Other ventricular tachycardia: Secondary | ICD-10-CM

## 2014-03-24 DIAGNOSIS — I5189 Other ill-defined heart diseases: Secondary | ICD-10-CM

## 2014-03-24 DIAGNOSIS — I519 Heart disease, unspecified: Secondary | ICD-10-CM

## 2014-03-24 LAB — MDC_IDC_ENUM_SESS_TYPE_INCLINIC
Battery Voltage: 3.1 V
Brady Statistic AP VP Percent: 0.02 %
Brady Statistic AP VS Percent: 70.66 %
Brady Statistic AS VP Percent: 0.01 %
Brady Statistic AS VS Percent: 29.3 %
Brady Statistic RA Percent Paced: 70.69 %
Date Time Interrogation Session: 20150929124246
HighPow Impedance: 19 Ohm
HighPow Impedance: 399 Ohm
HighPow Impedance: 47 Ohm
HighPow Impedance: 65 Ohm
Lead Channel Impedance Value: 399 Ohm
Lead Channel Impedance Value: 399 Ohm
Lead Channel Pacing Threshold Amplitude: 0.5 V
Lead Channel Pacing Threshold Pulse Width: 0.4 ms
Lead Channel Pacing Threshold Pulse Width: 0.4 ms
Lead Channel Sensing Intrinsic Amplitude: 1.75 mV
Lead Channel Sensing Intrinsic Amplitude: 2.125 mV
MDC IDC MSMT LEADCHNL RA PACING THRESHOLD AMPLITUDE: 0.5 V
MDC IDC MSMT LEADCHNL RV SENSING INTR AMPL: 8.5 mV
MDC IDC MSMT LEADCHNL RV SENSING INTR AMPL: 9.375 mV
MDC IDC SET LEADCHNL RA PACING AMPLITUDE: 1.5 V
MDC IDC SET LEADCHNL RV PACING AMPLITUDE: 2 V
MDC IDC SET LEADCHNL RV PACING PULSEWIDTH: 0.4 ms
MDC IDC SET LEADCHNL RV SENSING SENSITIVITY: 0.3 mV
MDC IDC SET ZONE DETECTION INTERVAL: 350 ms
MDC IDC SET ZONE DETECTION INTERVAL: 370 ms
MDC IDC STAT BRADY RV PERCENT PACED: 0.03 %
Zone Setting Detection Interval: 300 ms
Zone Setting Detection Interval: 360 ms

## 2014-03-24 NOTE — Patient Instructions (Signed)
APPOINTMENT WITH KRISTIN Friday FOR INR.    Dr. Sallyanne Kuster recommends that you schedule a follow-up appointment in: [place].

## 2014-03-26 ENCOUNTER — Encounter: Payer: Self-pay | Admitting: Cardiovascular Disease

## 2014-03-26 NOTE — Progress Notes (Signed)
Patient ID: Steven [name], male   DOB: 19-Dec-1955, 58 y.o.   MRN: [id number]     Reason for office visit ICD follow up  Dabid has been quite sick, but since his hospitalization was at [facility] regional we were not aware of his problems until today. Apparently he had a subdural hematoma. He had banged his head against a counter and then developed persistent and worsening headaches as well as left-sided neurological deficits. He had surgery for hematoma evacuation on August 13. His warfarin was stopped. He just restarted yesterday.  He has a history of a large anterior wall myocardial infarction with late presentation and subsequent severe ischemic cardiomyopathy, recurrent nonsustained ventricular tachycardia and implantation of a dual-chamber Medtronic defibrillator in September 2012. He has not received therapy from his device to date.   Following his recent hospitalization at [facility] the device recorded a 6 second episode of ventricular tachycardia, 33 beats at 219 beats per minute August 30. The arrhythmia terminated spontaneously, before he received treatment from his device. He remembers having a syncopal event at that time, but did not call for help. Several other very brief episodes of nonsustained ventricular tachycardia less than 2 seconds have been recorded all of which were asymptomatic.   No Known Allergies  Current Outpatient Prescriptions  Medication Sig Dispense Refill  . aspirin 81 MG tablet Take 81 mg by mouth daily.      [name] atorvastatin (LIPITOR) 40 MG tablet Take 1 tablet (40 mg total) by mouth daily.  30 tablet  6  . carvedilol (COREG) 25 MG tablet TAKE ONE TABLET BY MOUTH TWICE DAILY WITH MEALS  60 tablet  11  . clomiPHENE (CLOMID) 50 MG tablet Take 25 mg by mouth every other day.      . furosemide (LASIX) 40 MG tablet TAKE ONE TABLET BY MOUTH  DAILY      . isosorbide mononitrate (IMDUR) 30 MG 24 hr tablet TAKE ONE TABLET BY MOUTH ONCE DAILY  30 tablet  5  . lisinopril  (PRINIVIL,ZESTRIL) 5 MG tablet Take 1 tablet (5 mg total) by mouth daily.  30 tablet  6  . Multiple Vitamin (MULTIVITAMIN) tablet Take 1 tablet by mouth daily.      [name] NITROSTAT 0.4 MG SL tablet DISSOLVE ONE TABLET UNDER THE TONGUE EVERY 5 MINUTES AS NEEDED FOR CHEST PAIN.  DO NOT EXCEED A TOTAL OF 3 DOSES IN 15 MINUTES  25 tablet  1  . spironolactone (ALDACTONE) 25 MG tablet Take 0.5 tablets (12.5 mg total) by mouth 2 (two) times daily.  30 tablet  7  . warfarin (COUMADIN) 5 MG tablet Take 1 tablet (5 mg total) by mouth daily.  30 tablet  3   No current facility-administered medications for this visit.    Past Medical History  Diagnosis Date  . Acute MI, anterior wall   . Inguinal hernia, left   . CAD (coronary artery disease)     2D ECHO, 07/13/2011 - EF <25%, LV moderatelty dilated, LA moderately dilatedLEXISCAN, 12/14/2011 - moderate-severe perfusion defect seen in the basal anteroseptal, mid anterior, apicacl anterior, apical, apical inferior, and apical lateral regions, post-stress EF 25%, new EKG changes from baseline abnormalities    Past Surgical History  Procedure Laterality Date  . Cardiac defibrillator placement  03/02/2011    Medtronic Protecta XT DR, model R6821001, serial [id number]  . Cardiac catheterization  11/25/2010    Predilation balloon-Apex monorail 2x14m, Cutting balloon-2.25x119m resulting in a reduction of 100% stenosis down  to less than 10%  . Pacemaker placement  07/14/2010    Temporary placement of pacemaker, if rhythm issue continues will need a permanent device  . Cardiac catheterization  07/12/2010    LAD stented with a 3.5x57m bare-metal Veriflex stent resulting in a reduction of 100% lesion to 0%    No family history on file.  History   Social History  . Marital Status: Married    Spouse Name: N/A    Number of Children: N/A  . Years of Education: N/A   Occupational History  . Not on file.   Social History Main Topics  . Smoking status: Former  Smoker    Types: Cigarettes, Cigars  . Smokeless tobacco: Never Used  . Alcohol Use: 1.0 - 1.5 oz/week    2-3 drink(s) per week  . Drug Use: No  . Sexual Activity: Not on file   Other Topics Concern  . Not on file   Social History Narrative  . No narrative on file    Review of systems: The patient specifically denies any chest pain at rest or with exertion, dyspnea at rest or with exertion, orthopnea, paroxysmal nocturnal dyspnea, syncope, palpitations, focal neurological deficits, intermittent claudication, lower extremity edema, unexplained weight gain, cough, hemoptysis or wheezing.  The patient also denies abdominal pain, nausea, vomiting, dysphagia, diarrhea, constipation, polyuria, polydipsia, dysuria, hematuria, frequency, urgency, abnormal bleeding or bruising, fever, chills, unexpected weight changes, mood swings, change in skin or hair texture, change in voice quality, auditory or visual problems, allergic reactions or rashes, new musculoskeletal complaints other than usual "aches and pains".    PHYSICAL EXAM BP 138/62  Pulse 72  Resp 16  Ht 5' 9"  (1.753 m)  Wt 84.823 kg (187 lb)  BMI 27.60 kg/m2  General: Alert, oriented x3, no distress Head: no evidence of trauma, PERRL, EOMI, no exophtalmos or lid lag, no myxedema, no xanthelasma; normal ears, nose and oropharynx Neck: normal jugular venous pulsations and no hepatojugular reflux; brisk carotid pulses without delay and no carotid bruits Chest: clear to auscultation, no signs of consolidation by percussion or palpation, normal fremitus, symmetrical and full respiratory excursions Cardiovascular: normal position and quality of the apical impulse, regular rhythm, normal first and second heart sounds, no murmurs, rubs or gallops Abdomen: no tenderness or distention, no masses by palpation, no abnormal pulsatility or arterial bruits, normal bowel sounds, no hepatosplenomegaly Extremities: no clubbing, cyanosis or edema; 2+  radial, ulnar and brachial pulses bilaterally; 2+ right femoral, posterior tibial and dorsalis pedis pulses; 2+ left femoral, posterior tibial and dorsalis pedis pulses; no subclavian or femoral bruits Neurological: grossly nonfocal   EKG: Sinus rhythm, an extensive old anterior and lateral myocardial infarction with persistent repolarization changes  Lipid Panel     Component Value Date/Time   CHOL 128 12/10/2012 1524   TRIG 108 07/01/2013 0838   TRIG 70 12/10/2012 1524   HDL 38* 12/10/2012 1524   CHOLHDL 3.4 12/10/2012 1524   VLDL 14 12/10/2012 1524   LDLCALC 96 07/01/2013 0838   LDLCALC 76 12/10/2012 1524    BMET    Component Value Date/Time   NA 138 07/01/2013 0836   K 4.6 07/01/2013 0836   CL 101 07/01/2013 0836   CO2 27 07/01/2013 0836   GLUCOSE 110* 07/01/2013 0836   BUN 19 07/01/2013 0836   CREATININE 1.63* 07/01/2013 0836   CREATININE 1.45 11/27/2010 0500   CALCIUM 9.3 07/01/2013 0836   GFRNONAA 51* 11/27/2010 0500   GFRAA  Value: >60  The eGFR has been calculated using the MDRD equation. This calculation has not been validated in all clinical situations. eGFR's persistently <60 mL/min signify possible Chronic Kidney Disease. 11/27/2010 0500     ASSESSMENT AND PLAN VT (ventricular tachycardia) He had a lengthy and apparently symptomatic episode of ventricular tachycardia that resolved spontaneously after 33 beats. This was appropriately detected by his device but therapy was not delivered since our detection settings were quite conservative (30-40 beats). It happened not long after his hospitalization for subdural hematoma. Decreased the VT detection from 30/40 to 18/24 beats. Consider electrophysiology consultation. He is already on a high dose of beta blocker and has very well compensated congestive heart failure. No clear evidence of active coronary problems. The arrhythmia seems to have been monomorphic.   he will return on Friday for a prothrombin time checked in the Coumadin  clinic Orders Placed This Encounter  Procedures  . Implantable device check   Meds ordered this encounter  Medications  . Multiple Vitamin (MULTIVITAMIN) tablet    Sig: Take 1 tablet by mouth daily.    Holli Humbles, MD, [place] [phone] office [phone] pager

## 2014-03-26 NOTE — Assessment & Plan Note (Signed)
He had a lengthy and apparently symptomatic episode of ventricular tachycardia that resolved spontaneously after 33 beats. This was appropriately detected by his device but therapy was not delivered since our detection settings were quite conservative (30-40 beats). It happened not long after his hospitalization for subdural hematoma. Decreased the VT detection from 30/40 to 18/24 beats. Consider electrophysiology consultation. He is already on a high dose of beta blocker and has very well compensated congestive heart failure. No clear evidence of active coronary problems. The arrhythmia seems to have been monomorphic.

## 2014-03-27 ENCOUNTER — Ambulatory Visit (INDEPENDENT_AMBULATORY_CARE_PROVIDER_SITE_OTHER): Payer: Medicaid Other | Admitting: Pharmacist Clinician (PhC)/ Clinical Pharmacy Specialist

## 2014-03-27 DIAGNOSIS — Z7901 Long term (current) use of anticoagulants: Secondary | ICD-10-CM

## 2014-03-27 DIAGNOSIS — I513 Intracardiac thrombosis, not elsewhere classified: Secondary | ICD-10-CM

## 2014-03-27 DIAGNOSIS — I2109 ST elevation (STEMI) myocardial infarction involving other coronary artery of anterior wall: Secondary | ICD-10-CM

## 2014-03-27 LAB — POCT INR: INR: 1.2

## 2014-03-30 ENCOUNTER — Ambulatory Visit: Payer: Medicaid Other | Admitting: Pharmacist Clinician (PhC)/ Clinical Pharmacy Specialist

## 2014-04-02 ENCOUNTER — Encounter: Payer: Self-pay | Admitting: Cardiovascular Disease

## 2014-04-03 ENCOUNTER — Ambulatory Visit (INDEPENDENT_AMBULATORY_CARE_PROVIDER_SITE_OTHER): Payer: Medicaid Other | Admitting: Pharmacist Clinician (PhC)/ Clinical Pharmacy Specialist

## 2014-04-03 DIAGNOSIS — I513 Intracardiac thrombosis, not elsewhere classified: Secondary | ICD-10-CM

## 2014-04-03 DIAGNOSIS — Z7901 Long term (current) use of anticoagulants: Secondary | ICD-10-CM

## 2014-04-03 DIAGNOSIS — I2109 ST elevation (STEMI) myocardial infarction involving other coronary artery of anterior wall: Secondary | ICD-10-CM

## 2014-04-03 LAB — POCT INR: INR: 1.3

## 2014-04-10 ENCOUNTER — Other Ambulatory Visit: Payer: Self-pay | Admitting: Pharmacist Clinician (PhC)/ Clinical Pharmacy Specialist

## 2014-04-10 ENCOUNTER — Ambulatory Visit (INDEPENDENT_AMBULATORY_CARE_PROVIDER_SITE_OTHER): Payer: Medicaid Other | Admitting: Pharmacist Clinician (PhC)/ Clinical Pharmacy Specialist

## 2014-04-10 DIAGNOSIS — I2109 ST elevation (STEMI) myocardial infarction involving other coronary artery of anterior wall: Secondary | ICD-10-CM

## 2014-04-10 DIAGNOSIS — Z7901 Long term (current) use of anticoagulants: Secondary | ICD-10-CM

## 2014-04-10 DIAGNOSIS — I513 Intracardiac thrombosis, not elsewhere classified: Secondary | ICD-10-CM

## 2014-04-10 LAB — POCT INR: INR: 1.7

## 2014-04-17 ENCOUNTER — Ambulatory Visit (INDEPENDENT_AMBULATORY_CARE_PROVIDER_SITE_OTHER): Payer: Medicaid Other | Admitting: Pharmacist Clinician (PhC)/ Clinical Pharmacy Specialist

## 2014-04-17 DIAGNOSIS — I513 Intracardiac thrombosis, not elsewhere classified: Secondary | ICD-10-CM

## 2014-04-17 DIAGNOSIS — Z7901 Long term (current) use of anticoagulants: Secondary | ICD-10-CM

## 2014-04-17 DIAGNOSIS — I2109 ST elevation (STEMI) myocardial infarction involving other coronary artery of anterior wall: Secondary | ICD-10-CM

## 2014-04-17 LAB — POCT INR: INR: 2.2

## 2014-04-27 ENCOUNTER — Ambulatory Visit (INDEPENDENT_AMBULATORY_CARE_PROVIDER_SITE_OTHER): Payer: Medicaid Other | Admitting: Cardiovascular Disease

## 2014-04-27 ENCOUNTER — Ambulatory Visit (INDEPENDENT_AMBULATORY_CARE_PROVIDER_SITE_OTHER): Payer: Medicaid Other | Admitting: Pharmacist Clinician (PhC)/ Clinical Pharmacy Specialist

## 2014-04-27 ENCOUNTER — Encounter: Payer: Self-pay | Admitting: Cardiovascular Disease

## 2014-04-27 VITALS — BP 110/86 | HR 79 | Ht 69.0 in | Wt 189.4 lb

## 2014-04-27 DIAGNOSIS — Z7901 Long term (current) use of anticoagulants: Secondary | ICD-10-CM

## 2014-04-27 DIAGNOSIS — I2109 ST elevation (STEMI) myocardial infarction involving other coronary artery of anterior wall: Secondary | ICD-10-CM

## 2014-04-27 DIAGNOSIS — Z8679 Personal history of other diseases of the circulatory system: Secondary | ICD-10-CM

## 2014-04-27 DIAGNOSIS — I513 Intracardiac thrombosis, not elsewhere classified: Secondary | ICD-10-CM

## 2014-04-27 DIAGNOSIS — I2510 Atherosclerotic heart disease of native coronary artery without angina pectoris: Secondary | ICD-10-CM

## 2014-04-27 DIAGNOSIS — I472 Ventricular tachycardia, unspecified: Secondary | ICD-10-CM

## 2014-04-27 DIAGNOSIS — Z9889 Other specified postprocedural states: Secondary | ICD-10-CM

## 2014-04-27 DIAGNOSIS — I255 Ischemic cardiomyopathy: Secondary | ICD-10-CM

## 2014-04-27 DIAGNOSIS — E785 Hyperlipidemia, unspecified: Secondary | ICD-10-CM

## 2014-04-27 LAB — POCT INR: INR: 2.6

## 2014-04-27 MED ORDER — ATORVASTATIN CALCIUM 40 MG PO TABS: 40.0000 mg | ORAL_TABLET | Freq: Every day | ORAL | Status: DC

## 2014-04-27 NOTE — Patient Instructions (Signed)
Your physician recommends that you schedule a follow-up appointment in: 4 months with Dr. Claiborne Billings or sooner if needed.

## 2014-04-28 NOTE — Progress Notes (Signed)
Patient ID: Steven [name], male   DOB: 1955/11/21, 58 y.o.   MRN: [id number]     HPI: Steven [name] is a 58 y.o. male who presents to the office today for 2 month follow-up cardiology evaluation in followup of his recent subdural hematoma and evacuation done at [facility].  [name] is originally from [place] and suffered a large out of hospital anterior wall myocardial infarction in January 2012. [name]  He presented after a three-hour delay to the hospital and was taken emergently to the cardiac catheterization laboratory where I performed successful emergent intervention with the door to balloon time of only 25 minutes. His LAD was successfully recanalized. CPK increased to 8012 with an MB of 716 and troponin was greater than 100 initially. He subsequently wore a life vest and ultimately after several months without significant improvement of LV function he underwent implantable cardio defibrillator insertion.   Interrogation of his defibrillator in the past had shown episodes of nonsustained VT and his dose of carvedilol has been further titrated.  A followup echo Doppler study on 08/04/2013 showed an ejection fraction of 20-25% with diffuse hypokinesis but there was akinesis of the entire anteroseptal myocardium and apical region. There now appear to be an apparent medium-sized mural apical thrombus which was not noted on his last echo. He denies paresthesias. He denies episodes of chest pain. He denies shortness of breath.At his last office visit in February in light of his moderate sized mural thrombus, Plavix, was discontinued, and he was started on Coumadin therapy.  In August 2015he tripped and hit his head on a bar recently developed severe headache, transient leg weakness, which evolves into slurred speech.  He ultimately sought medical attention approximately a week and a half later in [facility], and he was found to have a subdural hematoma.  He underwent a right burr hole subdural hematoma  evacuation on 02/05/2014 by [name] at [facility].  Since that time his aspirin and Coumadin have been on hold.  He had a follow-up CT scan and ultimately was given clearance to reinstitute Coumadin anticoagulation in light of his severe cardiommyopathy with an ejection fraction of 15-20% on echo on 03/13/2014 and medium-sized apical mural thrombus which had been documented on a previous echo.  He will be having a subsequent CT scan and will be seeing [name] in follow-up.   Presently, he denies any chest pain.  He denies shortness of breath.  He's unaware of any recurrent palpitations.  His ICD was interrogated on 03/24/2014.he saw Dr. Recardo Evangelist, who decreased the VT detection from 30/4218/24 beats.  He presents today for follow-up evaluation.  Past Medical History  Diagnosis Date  . Acute MI, anterior wall   . Inguinal hernia, left   . CAD (coronary artery disease)     2D ECHO, 07/13/2011 - EF <25%, LV moderatelty dilated, LA moderately dilatedLEXISCAN, 12/14/2011 - moderate-severe perfusion defect seen in the basal anteroseptal, mid anterior, apicacl anterior, apical, apical inferior, and apical lateral regions, post-stress EF 25%, new EKG changes from baseline abnormalities    Past Surgical History  Procedure Laterality Date  . Cardiac defibrillator placement  03/02/2011    Medtronic Protecta XT DR, model [id number], serial [id number]  . Cardiac catheterization  11/25/2010    Predilation balloon-Apex monorail 2x58m, Cutting balloon-2.25x166m resulting in a reduction of 100% stenosis down to less than 10%  . Pacemaker placement  07/14/2010    Temporary placement of pacemaker, if rhythm issue continues  will need a permanent device  . Cardiac catheterization  07/12/2010    LAD stented with a 3.5x38m bare-metal Veriflex stent resulting in a reduction of 100% lesion to 0%    No Known Allergies  Current Outpatient Prescriptions  Medication Sig Dispense Refill  . aspirin  81 MG tablet Take 81 mg by mouth daily.    .Marland Kitchenatorvastatin (LIPITOR) 40 MG tablet Take 1 tablet (40 mg total) by mouth daily. 90 tablet 3  . carvedilol (COREG) 25 MG tablet TAKE ONE TABLET BY MOUTH TWICE DAILY WITH MEALS 60 tablet 11  . clomiPHENE (CLOMID) 50 MG tablet Take 25 mg by mouth every other day.    . furosemide (LASIX) 40 MG tablet TAKE ONE TABLET BY MOUTH  DAILY    . isosorbide mononitrate (IMDUR) 30 MG 24 hr tablet TAKE ONE TABLET BY MOUTH ONCE DAILY 30 tablet 5  . lisinopril (PRINIVIL,ZESTRIL) 5 MG tablet Take 1 tablet (5 mg total) by mouth daily. 30 tablet 6  . Multiple Vitamin (MULTIVITAMIN) tablet Take 1 tablet by mouth daily.    .Marland KitchenNITROSTAT 0.4 MG SL tablet DISSOLVE ONE TABLET UNDER THE TONGUE EVERY 5 MINUTES AS NEEDED FOR CHEST PAIN.  DO NOT EXCEED A TOTAL OF 3 DOSES IN 15 MINUTES 25 tablet 1  . spironolactone (ALDACTONE) 25 MG tablet Take 0.5 tablets (12.5 mg total) by mouth 2 (two) times daily. 30 tablet 7  . warfarin (COUMADIN) 5 MG tablet Take 1 tablet by mouth daily or as directed by coumadin clinic 30 tablet 5   No current facility-administered medications for this visit.   Social he is single. He has one child. [place] There is a remote tobacco history. He does drink occasional alcohol.   ROS General: Negative; No fevers, chills, or night sweats;  HEENT: Negative; No changes in vision or hearing, sinus congestion, difficulty swallowing Pulmonary: Negative; No cough, wheezing, shortness of breath, hemoptysis Cardiovascular: his previous dizziness has improved; No chest pain, presyncope, syncope, palpitations GI: Negative; No nausea, vomiting, diarrhea, or abdominal pain GU: Positive for erectile dysfunction.; No dysuria, hematuria, or difficulty voiding Musculoskeletal: Positive for history of an inguinal hernia; no myalgias, joint pain, or weakness Hematologic/Oncology: Negative; no easy bruising, bleeding Endocrine: Negative; no heat/cold  intolerance; no diabetes Neuro: Status post recent subdural hematoma evacuation Skin: Negative; No rashes or skin lesions Psychiatric: Negative; No behavioral problems, depression Sleep: Negative; No snoring, daytime sleepiness, hypersomnolence, bruxism, restless legs, hypnogognic hallucinations, no cataplexy Other comprehensive 14 point system review is negative.  PE BP 110/86 mmHg  Pulse 79  Ht 5' 9"  (1.753 m)  Wt 189 lb 6.4 oz (85.911 kg)  BMI 27.96 kg/m2  No orthostatic drop.   General: Alert, oriented, no distress.  HEENT: Normocephalic, atraumatic. Pupils round and reactive; sclera anicteric;no lid lag  Nose without nasal septal hypertrophy Mouth/Parynx benign; Mallinpatti scale 3 Neck: No JVD, no carotid bruits Chest wall: No tenderness to palpation Lungs: clear to ausculatation and percussion; no wheezing or rales Heart: RRR, s1 s2 normal 1/6 systolic murmur. No S3 or S4 gallop. No rub thrill or heave. Abdomen: soft, nontender; no hepatosplenomehaly, BS+; abdominal aorta nontender and not dilated by palpation. Back: No CVA tenderness Pulses 2+ Extremities: no clubbing cyanosis or edema, Homan's sign negative  Neurologic: grossly nonfocal; cranial nerves grossly normal Psychological: Normal affect and mood  ECG (independently read by me): Atrially paced rhythm.  Old anterior wall MI.  Per Ms. Lee noted ST-T changes laterally  Prior September 2015 ECG (independently read by me): Sinus rhythm at 63 beats per minute.  Old anterior wall myocardial infarction, poor R. progression V1 through V4 and T-wave changes V3 through V6, leads 1, and L.    prior 08/18/2013 ECG (independently read by me):  Atrially paced at 64 beats per minute. QTc interval 427 ms. QRS duration 98 ms. Diffuse anterolateral T-wave changes secondary to his prior MI   Prior ECG of 02/27/2013: Atrial  paced rhythm at 64 beats per minute. Evidence for his prior anterolateral wall myocardial infarction with  diffuse T-wave abnormality and precordial Q waves V1 through V5  LABS:  BMET    Component Value Date/Time   NA 138 07/01/2013 0836   K 4.6 07/01/2013 0836   CL 101 07/01/2013 0836   CO2 27 07/01/2013 0836   GLUCOSE 110* 07/01/2013 0836   BUN 19 07/01/2013 0836   CREATININE 1.63* 07/01/2013 0836   CREATININE 1.45 11/27/2010 0500   CALCIUM 9.3 07/01/2013 0836   GFRNONAA 51* 11/27/2010 0500   GFRAA  11/27/2010 0500    >60        The eGFR has been calculated using the MDRD equation. This calculation has not been validated in all clinical situations. eGFR's persistently <60 mL/min signify possible Chronic Kidney Disease.     Hepatic Function Panel     Component Value Date/Time   PROT 7.6 07/01/2013 0836   ALBUMIN 4.2 07/01/2013 0836   AST 23 07/01/2013 0836   ALT 11 07/01/2013 0836   ALKPHOS 55 07/01/2013 0836   BILITOT 0.7 07/01/2013 0836   BILIDIR 0.1 11/23/2010 2007   IBILI 0.5 11/23/2010 2007     CBC    Component Value Date/Time   WBC 7.7 07/01/2013 0833   RBC 4.92 07/01/2013 0833   HGB 15.6 07/01/2013 0833   HCT 44.5 07/01/2013 0833   PLT 191 07/01/2013 0833   MCV 90.4 07/01/2013 0833   MCH 31.7 07/01/2013 0833   MCHC 35.1 07/01/2013 0833   RDW 13.8 07/01/2013 0833   LYMPHSABS 1.8 11/23/2010 2007   MONOABS 2.0* 11/23/2010 2007   EOSABS 0.0 11/23/2010 2007   BASOSABS 0.0 11/23/2010 2007     BNP    Component Value Date/Time   PROBNP 9719.0* 11/23/2010 2006    Lipid Panel     Component Value Date/Time   CHOL 128 12/10/2012 1524   TRIG 108 07/01/2013 0838   TRIG 70 12/10/2012 1524   HDL 38* 12/10/2012 1524   CHOLHDL 3.4 12/10/2012 1524   VLDL 14 12/10/2012 1524   LDLCALC 96 07/01/2013 0838   LDLCALC 76 12/10/2012 1524     RADIOLOGY: No results found.    ASSESSMENT AND PLAN: Mr. Dollins is now 3 years following his large anterior wall myocardial infarction. He did undergo a subsequent  cardiac catheterization  on June 2012  which  showed a patent stent in the LAD but he now had an occluded marginal branch of his LCX with collaterals. There also was 80% first diagonal stenosis in a small vessel and 40% RCA stenosis. He does have an ischemic cardiomyopathy.his most recent echo revealed an ejection fraction of 15-20%.  There was akinesis of his apical myocardium, but on this most recent echo.  There was no mention of any of the previously noted medium-size apical thrombus.  He has been back on low-dose Coumadin therapy.  His INR today is 2.6.  I would recommend that this be in the 2.0-2.5 range.  He will undergo  a follow-up head CT per [name] to make certain he is not having any recurrence following his traumatic induced subdural hematoma.  He has documented episodes of nonsustained VT.  He has not had any defibrillator discharge.  If his NSVT continues, electrophysiologic consultation may be warranted.  Presently, he is compensated without acute heart failure.he will have another defibrillator check 3 months after his last interrogation.  I will see him in 4 months for cardiology reassessment.  Troy Sine, MD, [facility]  04/28/2014 7:05 PM

## 2014-05-18 ENCOUNTER — Ambulatory Visit (INDEPENDENT_AMBULATORY_CARE_PROVIDER_SITE_OTHER): Payer: Medicaid Other | Admitting: Pharmacist Clinician (PhC)/ Clinical Pharmacy Specialist

## 2014-05-18 DIAGNOSIS — I513 Intracardiac thrombosis, not elsewhere classified: Secondary | ICD-10-CM

## 2014-05-18 DIAGNOSIS — I2109 ST elevation (STEMI) myocardial infarction involving other coronary artery of anterior wall: Secondary | ICD-10-CM

## 2014-05-18 DIAGNOSIS — Z7901 Long term (current) use of anticoagulants: Secondary | ICD-10-CM

## 2014-05-18 LAB — POCT INR: INR: 2.8

## 2014-06-15 ENCOUNTER — Ambulatory Visit (INDEPENDENT_AMBULATORY_CARE_PROVIDER_SITE_OTHER): Payer: Medicaid Other | Admitting: Pharmacist Clinician (PhC)/ Clinical Pharmacy Specialist

## 2014-06-15 DIAGNOSIS — I2109 ST elevation (STEMI) myocardial infarction involving other coronary artery of anterior wall: Secondary | ICD-10-CM

## 2014-06-15 DIAGNOSIS — Z7901 Long term (current) use of anticoagulants: Secondary | ICD-10-CM

## 2014-06-15 DIAGNOSIS — I513 Intracardiac thrombosis, not elsewhere classified: Secondary | ICD-10-CM

## 2014-06-15 LAB — POCT INR: INR: 2.2

## 2014-07-01 ENCOUNTER — Other Ambulatory Visit: Payer: Self-pay | Admitting: Cardiovascular Disease

## 2014-07-01 NOTE — Telephone Encounter (Signed)
Rx(s) sent to pharmacy electronically.  

## 2014-07-13 ENCOUNTER — Ambulatory Visit: Payer: Medicaid Other | Admitting: Pharmacist Clinician (PhC)/ Clinical Pharmacy Specialist

## 2014-07-17 ENCOUNTER — Ambulatory Visit: Payer: Medicaid Other | Admitting: Pharmacist Clinician (PhC)/ Clinical Pharmacy Specialist

## 2014-07-21 ENCOUNTER — Telehealth: Payer: Self-pay | Admitting: Cardiology

## 2014-07-21 NOTE — Telephone Encounter (Signed)
Left call back number for pt to return call.

## 2014-07-24 ENCOUNTER — Ambulatory Visit (INDEPENDENT_AMBULATORY_CARE_PROVIDER_SITE_OTHER): Payer: Medicaid Other | Admitting: Pharmacist Clinician (PhC)/ Clinical Pharmacy Specialist

## 2014-07-24 ENCOUNTER — Telehealth: Payer: Self-pay | Admitting: Cardiovascular Disease

## 2014-07-24 DIAGNOSIS — I513 Intracardiac thrombosis, not elsewhere classified: Secondary | ICD-10-CM

## 2014-07-24 DIAGNOSIS — I2109 ST elevation (STEMI) myocardial infarction involving other coronary artery of anterior wall: Secondary | ICD-10-CM

## 2014-07-24 DIAGNOSIS — Z7901 Long term (current) use of anticoagulants: Secondary | ICD-10-CM

## 2014-07-24 LAB — POCT INR: INR: 2.7

## 2014-07-24 NOTE — Telephone Encounter (Signed)
Attempted to return pt call no answer and unable to leave a message.

## 2014-07-24 NOTE — Telephone Encounter (Signed)
New Msg        Pt calling states he received a letter about a program taking place from 1-5pm here at [facility]. St    Please return pt call.

## 2014-07-27 NOTE — Telephone Encounter (Signed)
Informed pt that monitor upgrade can be done from home by going to website or calling 1-800 number. Pt verbalized understanding.

## 2014-08-11 ENCOUNTER — Ambulatory Visit (INDEPENDENT_AMBULATORY_CARE_PROVIDER_SITE_OTHER): Payer: Medicaid Other | Admitting: Cardiovascular Disease

## 2014-08-11 ENCOUNTER — Encounter: Payer: Self-pay | Admitting: Cardiovascular Disease

## 2014-08-11 VITALS — BP 97/66 | HR 65 | Resp 16 | Ht 69.0 in | Wt 195.7 lb

## 2014-08-11 DIAGNOSIS — I519 Heart disease, unspecified: Secondary | ICD-10-CM

## 2014-08-11 DIAGNOSIS — Z9889 Other specified postprocedural states: Secondary | ICD-10-CM

## 2014-08-11 DIAGNOSIS — I2109 ST elevation (STEMI) myocardial infarction involving other coronary artery of anterior wall: Secondary | ICD-10-CM

## 2014-08-11 DIAGNOSIS — Z7901 Long term (current) use of anticoagulants: Secondary | ICD-10-CM

## 2014-08-11 DIAGNOSIS — I513 Intracardiac thrombosis, not elsewhere classified: Secondary | ICD-10-CM

## 2014-08-11 DIAGNOSIS — Z8679 Personal history of other diseases of the circulatory system: Secondary | ICD-10-CM

## 2014-08-11 DIAGNOSIS — I472 Ventricular tachycardia, unspecified: Secondary | ICD-10-CM

## 2014-08-11 DIAGNOSIS — I255 Ischemic cardiomyopathy: Secondary | ICD-10-CM

## 2014-08-11 DIAGNOSIS — I2510 Atherosclerotic heart disease of native coronary artery without angina pectoris: Secondary | ICD-10-CM

## 2014-08-11 DIAGNOSIS — E785 Hyperlipidemia, unspecified: Secondary | ICD-10-CM

## 2014-08-11 DIAGNOSIS — I5189 Other ill-defined heart diseases: Secondary | ICD-10-CM

## 2014-08-11 DIAGNOSIS — Z95810 Presence of automatic (implantable) cardiac defibrillator: Secondary | ICD-10-CM

## 2014-08-11 NOTE — Patient Instructions (Signed)
Remote monitoring is used to monitor your ICD from home. This monitoring reduces the number of office visits required to check your device to one time per year. It allows [place] to keep an eye on the functioning of your device to ensure it is working properly. You are scheduled for a device check from home on 11-10-2014. You may send your transmission at any time that day. If you have a wireless device, the transmission will be sent automatically. After your physician reviews your transmission, you will receive a postcard with your next transmission date.  Your physician recommends that you schedule a follow-up appointment in: 12 months with Dr.Croitoru

## 2014-08-11 NOTE — Progress Notes (Signed)
Patient ID: Steven [name], male   DOB: 08-02-1955, 59 y.o.   MRN: [id number]     Reason for office visit ICD, NSVT  Steven [name] has a history of a large anterior wall myocardial infarction with late presentation and subsequent severe ischemic cardiomyopathy, recurrent nonsustained ventricular tachycardia and implantation of a dual-chamber Medtronic Protecta defibrillator in September 2012. He has not received therapy from his device to date.   The device recorded a 6 second episode of ventricular tachycardia, 33 beats at 219 beats per minute August 30. The arrhythmia terminated spontaneously, before he received treatment from his device. He remembers having a syncopal event at that time, but did not call for help. He hit his head and later developed headaches, found to have a subdural hematoma requiring surgical evacuation in [facility].  Since then he has had occasional brief and asymptomatic NSVT events, most recently 8 beats at 212 bpm on July 09, 2014. We have made the device intervene quicker to avoid syncope, but no therapy needed so far.  His activity has returned to previous baseline of about 3 hours/day. No cardiac complaints. BP is relatively low 97/66, but he is not bothered by it.Denies bleeding or falls.  Optivol is normal. No PAF. 79% atrial paced, 0.1% ventricular paced.   No Known Allergies  Current Outpatient Prescriptions  Medication Sig Dispense Refill  . aspirin 81 MG tablet Take 81 mg by mouth daily.    [name] atorvastatin (LIPITOR) 40 MG tablet Take 1 tablet (40 mg total) by mouth daily. 90 tablet 3  . carvedilol (COREG) 25 MG tablet TAKE ONE TABLET BY MOUTH TWICE DAILY WITH MEALS 60 tablet 11  . clomiPHENE (CLOMID) 50 MG tablet Take 25 mg by mouth every other day.    . furosemide (LASIX) 40 MG tablet TAKE ONE TABLET BY MOUTH TWICE DAILY 60 tablet 10  . isosorbide mononitrate (IMDUR) 30 MG 24 hr tablet TAKE ONE TABLET BY MOUTH ONCE DAILY 30 tablet 5  . lisinopril  (PRINIVIL,ZESTRIL) 5 MG tablet Take 1 tablet (5 mg total) by mouth daily. 30 tablet 6  . Multiple Vitamin (MULTIVITAMIN) tablet Take 1 tablet by mouth daily.    [name] NITROSTAT 0.4 MG SL tablet DISSOLVE ONE TABLET UNDER THE TONGUE EVERY 5 MINUTES AS NEEDED FOR CHEST PAIN.  DO NOT EXCEED A TOTAL OF 3 DOSES IN 15 MINUTES 25 tablet 1  . spironolactone (ALDACTONE) 25 MG tablet Take 0.5 tablets (12.5 mg total) by mouth 2 (two) times daily. 30 tablet 7  . warfarin (COUMADIN) 5 MG tablet Take 1 tablet by mouth daily or as directed by coumadin clinic 30 tablet 5   No current facility-administered medications for this visit.    Past Medical History  Diagnosis Date  . Acute MI, anterior wall   . Inguinal hernia, left   . CAD (coronary artery disease)     2D ECHO, 07/13/2011 - EF <25%, LV moderatelty dilated, LA moderately dilatedLEXISCAN, 12/14/2011 - moderate-severe perfusion defect seen in the basal anteroseptal, mid anterior, apicacl anterior, apical, apical inferior, and apical lateral regions, post-stress EF 25%, new EKG changes from baseline abnormalities    Past Surgical History  Procedure Laterality Date  . Cardiac defibrillator placement  03/02/2011    Medtronic Protecta XT DR, model [id number], serial [id number]  . Cardiac catheterization  11/25/2010    Predilation balloon-Apex monorail 2x61m, Cutting balloon-2.25x131m resulting in a reduction of 100% stenosis down to less than 10%  . Pacemaker placement  07/14/2010  Temporary placement of pacemaker, if rhythm issue continues will need a permanent device  . Cardiac catheterization  07/12/2010    LAD stented with a 3.5x1m bare-metal Veriflex stent resulting in a reduction of 100% lesion to 0%    No family history on file.  History   Social History  . Marital Status: Married    Spouse Name: N/A  . Number of Children: N/A  . Years of Education: N/A   Occupational History  . Not on file.   Social History Main Topics  . Smoking status:  Former Smoker    Types: Cigarettes, Cigars  . Smokeless tobacco: Never Used  . Alcohol Use: 1.0 - 1.5 oz/week    2-3 drink(s) per week  . Drug Use: No  . Sexual Activity: Not on file   Other Topics Concern  . Not on file   Social History Narrative    Review of systems: The patient specifically denies any chest pain at rest or with exertion, dyspnea at rest or with exertion, orthopnea, paroxysmal nocturnal dyspnea, syncope, palpitations, focal neurological deficits, intermittent claudication, lower extremity edema, unexplained weight gain, cough, hemoptysis or wheezing.  The patient also denies abdominal pain, nausea, vomiting, dysphagia, diarrhea, constipation, polyuria, polydipsia, dysuria, hematuria, frequency, urgency, abnormal bleeding or bruising, fever, chills, unexpected weight changes, mood swings, change in skin or hair texture, change in voice quality, auditory or visual problems, allergic reactions or rashes, new musculoskeletal complaints other than usual "aches and pains".  PHYSICAL EXAM BP 97/66 mmHg  Pulse 65  Resp 16  Ht 5' 9"  (1.753 m)  Wt 88.769 kg (195 lb 11.2 oz)  BMI 28.89 kg/m2 General: Alert, oriented x3, no distress Head: no evidence of trauma, PERRL, EOMI, no exophtalmos or lid lag, no myxedema, no xanthelasma; normal ears, nose and oropharynx Neck: normal jugular venous pulsations and no hepatojugular reflux; brisk carotid pulses without delay and no carotid bruits Chest: clear to auscultation, no signs of consolidation by percussion or palpation, normal fremitus, symmetrical and full respiratory excursions Cardiovascular: normal position and quality of the apical impulse, regular rhythm, normal first and second heart sounds, no murmurs, rubs or gallops Abdomen: no tenderness or distention, no masses by palpation, no abnormal pulsatility or arterial bruits, normal bowel sounds, no hepatosplenomegaly Extremities: no clubbing, cyanosis or edema; 2+ radial,  ulnar and brachial pulses bilaterally; 2+ right femoral, posterior tibial and dorsalis pedis pulses; 2+ left femoral, posterior tibial and dorsalis pedis pulses; no subclavian or femoral bruits Neurological: grossly nonfocal   Lipid Panel     Component Value Date/Time   CHOL 128 12/10/2012 1524   TRIG 108 07/01/2013 0838   TRIG 70 12/10/2012 1524   HDL 38* 12/10/2012 1524   CHOLHDL 3.4 12/10/2012 1524   VLDL 14 12/10/2012 1524   LDLCALC 96 07/01/2013 0838   LDLCALC 76 12/10/2012 1524    BMET    Component Value Date/Time   NA 138 07/01/2013 0836   K 4.6 07/01/2013 0836   CL 101 07/01/2013 0836   CO2 27 07/01/2013 0836   GLUCOSE 110* 07/01/2013 0836   BUN 19 07/01/2013 0836   CREATININE 1.63* 07/01/2013 0836   CREATININE 1.45 11/27/2010 0500   CALCIUM 9.3 07/01/2013 0836   GFRNONAA 51* 11/27/2010 0500   GFRAA  11/27/2010 0500    >60        The eGFR has been calculated using the MDRD equation. This calculation has not been validated in all clinical situations. eGFR's persistently <60 mL/min signify possible  Chronic Kidney Disease.     ASSESSMENT AND PLAN  Nonsustained VT None of the episodes recorded were long enough to cause symptoms or require ICD therapy.  AICD Normal function. Remote checks Q 3 months, office check yearly.  Severe ischemic cardiomyopathy with LV apical thrombus On warfarin (complicated by traumatic SDH in 2015). Last INR 2.7.  NYHA class I-II systolic HF. Low BP precludes more CHF meds.  Holli Humbles, MD, [place] [phone] office [phone] pager

## 2014-08-18 LAB — MDC_IDC_ENUM_SESS_TYPE_REMOTE
Battery Voltage: 3.11 V
Brady Statistic AP VP Percent: 0.02 %
Brady Statistic AP VS Percent: 78.69 %
Brady Statistic RA Percent Paced: 78.72 %
Brady Statistic RV Percent Paced: 0.03 %
HIGH POWER IMPEDANCE MEASURED VALUE: 380 Ohm
HIGH POWER IMPEDANCE MEASURED VALUE: 46 Ohm
HighPow Impedance: 62 Ohm
Lead Channel Impedance Value: 399 Ohm
Lead Channel Pacing Threshold Amplitude: 0.375 V
Lead Channel Pacing Threshold Pulse Width: 0.4 ms
Lead Channel Pacing Threshold Pulse Width: 0.4 ms
Lead Channel Sensing Intrinsic Amplitude: 2 mV
Lead Channel Sensing Intrinsic Amplitude: 7.875 mV
Lead Channel Sensing Intrinsic Amplitude: 7.875 mV
Lead Channel Setting Pacing Amplitude: 1.5 V
Lead Channel Setting Pacing Pulse Width: 0.4 ms
MDC IDC MSMT LEADCHNL RA IMPEDANCE VALUE: 380 Ohm
MDC IDC MSMT LEADCHNL RA SENSING INTR AMPL: 2 mV
MDC IDC MSMT LEADCHNL RV PACING THRESHOLD AMPLITUDE: 0.5 V
MDC IDC SESS DTM: 20160216170252
MDC IDC SET LEADCHNL RV PACING AMPLITUDE: 2 V
MDC IDC SET LEADCHNL RV SENSING SENSITIVITY: 0.3 mV
MDC IDC SET ZONE DETECTION INTERVAL: 300 ms
MDC IDC SET ZONE DETECTION INTERVAL: 350 ms
MDC IDC SET ZONE DETECTION INTERVAL: 370 ms
MDC IDC STAT BRADY AS VP PERCENT: 0.01 %
MDC IDC STAT BRADY AS VS PERCENT: 21.28 %
Zone Setting Detection Interval: 360 ms

## 2014-08-19 ENCOUNTER — Other Ambulatory Visit: Payer: Self-pay | Admitting: Nephrology

## 2014-08-19 DIAGNOSIS — N183 Chronic kidney disease, stage 3 unspecified: Secondary | ICD-10-CM

## 2014-08-20 ENCOUNTER — Encounter: Payer: Self-pay | Admitting: Cardiovascular Disease

## 2014-08-21 ENCOUNTER — Telehealth: Payer: Self-pay | Admitting: Cardiovascular Disease

## 2014-08-21 NOTE — Telephone Encounter (Signed)
Received records from [facility] for appointment with Dr Claiborne Billings on 08/31/14.   Records given to [facility] (medical records) for Dr Evette Georges schedule on 08/31/14. lp

## 2014-08-26 ENCOUNTER — Ambulatory Visit
Admission: RE | Admit: 2014-08-26 | Discharge: 2014-08-26 | Disposition: A | Discharge status: Home / Self Care | Payer: Medicaid Other | Source: Ambulatory Visit | Attending: Nephrology | Admitting: Nephrology

## 2014-08-26 ENCOUNTER — Other Ambulatory Visit: Payer: Self-pay | Admitting: Cardiovascular Disease

## 2014-08-26 DIAGNOSIS — N183 Chronic kidney disease, stage 3 unspecified: Secondary | ICD-10-CM

## 2014-08-26 NOTE — Telephone Encounter (Signed)
Lasix refilled #60 with 10 refills 07/01/14 imdur refilled

## 2014-08-27 ENCOUNTER — Other Ambulatory Visit: Payer: Self-pay | Admitting: Cardiovascular Disease

## 2014-08-27 MED ORDER — CARVEDILOL 25 MG PO TABS: 25.0000 mg | ORAL_TABLET | Freq: Two times a day (BID) | ORAL | Status: DC

## 2014-08-27 MED ORDER — FUROSEMIDE 40 MG PO TABS: 40.0000 mg | ORAL_TABLET | Freq: Two times a day (BID) | ORAL | Status: DC

## 2014-08-27 NOTE — Telephone Encounter (Signed)
°  1. Which medications need to be refilled? Furosemide and Carvedilol  2. Which pharmacy is medication to be sent to?[facility]-[id number]  3. Do they need a 30 day or 90 day supply? 90 of both and refills  4. Would they like a call back once the medication has been sent to the pharmacy? no

## 2014-08-27 NOTE — Telephone Encounter (Signed)
Rx(s) sent to pharmacy electronically.  

## 2014-08-31 ENCOUNTER — Encounter: Payer: Self-pay | Admitting: Cardiovascular Disease

## 2014-08-31 ENCOUNTER — Ambulatory Visit (INDEPENDENT_AMBULATORY_CARE_PROVIDER_SITE_OTHER): Payer: Medicaid Other | Admitting: Cardiovascular Disease

## 2014-08-31 ENCOUNTER — Ambulatory Visit (INDEPENDENT_AMBULATORY_CARE_PROVIDER_SITE_OTHER): Payer: Medicaid Other | Admitting: Pharmacist Clinician (PhC)/ Clinical Pharmacy Specialist

## 2014-08-31 VITALS — BP 92/66 | HR 74 | Ht 70.0 in | Wt 199.8 lb

## 2014-08-31 DIAGNOSIS — I472 Ventricular tachycardia, unspecified: Secondary | ICD-10-CM

## 2014-08-31 DIAGNOSIS — I2510 Atherosclerotic heart disease of native coronary artery without angina pectoris: Secondary | ICD-10-CM

## 2014-08-31 DIAGNOSIS — Z7901 Long term (current) use of anticoagulants: Secondary | ICD-10-CM

## 2014-08-31 DIAGNOSIS — I513 Intracardiac thrombosis, not elsewhere classified: Secondary | ICD-10-CM

## 2014-08-31 DIAGNOSIS — Z79899 Other long term (current) drug therapy: Secondary | ICD-10-CM

## 2014-08-31 DIAGNOSIS — I255 Ischemic cardiomyopathy: Secondary | ICD-10-CM

## 2014-08-31 DIAGNOSIS — E785 Hyperlipidemia, unspecified: Secondary | ICD-10-CM

## 2014-08-31 DIAGNOSIS — I2109 ST elevation (STEMI) myocardial infarction involving other coronary artery of anterior wall: Secondary | ICD-10-CM

## 2014-08-31 LAB — POCT INR: INR: 2.6

## 2014-08-31 NOTE — Patient Instructions (Signed)
Your physician wants you to follow-up in: 6 Months You will receive a reminder letter in the mail two months in advance. If you don't receive a letter, please call our office to schedule the follow-up appointment.  Your physician recommends that you return for lab work in: CMP,CBC,TSH, BNP, FASTING LIPIDS

## 2014-08-31 NOTE — Progress Notes (Signed)
Patient ID: Steven [name], male   DOB: 02-20-1956, 59 y.o.   MRN: [id number]   HPI: Steven [name] is a 59 y.o. male who presents to the office today for 4 month follow-up cardiology evaluation.  Steven [name] is originally from [place] and suffered a large out of hospital anterior wall myocardial infarction in January 2012. [name]  He presented after a three-hour delay to the hospital and was taken emergently to the cardiac catheterization laboratory where I performed successful emergent intervention with the door to balloon time of only 25 minutes. His LAD was successfully recanalized. CPK increased to 8012 with an MB of 716 and troponin was greater than 100 initially. He subsequently wore a life vest and ultimately after several months without significant improvement of LV function he underwent implantable cardio defibrillator insertion.  Interrogation of his defibrillator in the past had shown episodes of nonsustained VT and his dose of carvedilol has been further titrated.  A followup echo Doppler study on 08/04/2013 showed an ejection fraction of 20-25% with diffuse hypokinesis but there was akinesis of the entire anteroseptal myocardium and apical region. There now appear to be an apparent medium-sized mural apical thrombus which was not noted on his last echo. He denies paresthesias. He denies episodes of chest pain. He denies shortness of breath.At his last office visit in February in light of his moderate sized mural thrombus, Plavix, was discontinued, and he was started on Coumadin therapy.  In August 2015 he tripped and hit his head on a bar recently developed severe headache, transient leg weakness, which evolves into slurred speech.  He underwent evaluation 1-1/2 weeks later and was found  to have a subdural hematoma.  He underwent a right burr hole subdural hematoma evacuation on 02/05/2014 by [name] at [facility].  Initially aspirin and Coumadin were on hold.  He had a follow-up CT  scan and ultimately was given clearance to reinstitute Coumadin anticoagulation in light of his severe cardiomyopathy with an ejection fraction of 15-20% on echo on 03/13/2014 and medium-sized apical mural thrombus which had been documented on a previous echo.  He will be having a subsequent CT scan and will be seeing [name] in follow-up.   Presently, he denies any chest pain.  He denies shortness of breath.  He's unaware of any recurrent palpitations.  His ICD was  last interrogated one month ago by Dr. [name].  He was noted to have very short nonsustained bursts of nonsustained VT with one episode lasting 8 beats at 212 bpm.  On general 14 2016.  His device was adjusted so as it would intervene more quickly and avoid syncope.  He denies any defibrillator discharges.  He presents today for follow-up evaluation.  Past Medical History  Diagnosis Date  . Acute MI, anterior wall   . Inguinal hernia, left   . CAD (coronary artery disease)     2D ECHO, 07/13/2011 - EF <25%, LV moderatelty dilated, LA moderately dilatedLEXISCAN, 12/14/2011 - moderate-severe perfusion defect seen in the basal anteroseptal, mid anterior, apicacl anterior, apical, apical inferior, and apical lateral regions, post-stress EF 25%, new EKG changes from baseline abnormalities    Past Surgical History  Procedure Laterality Date  . Cardiac defibrillator placement  03/02/2011    Medtronic Protecta XT DR, model [id number], serial [id number]  . Cardiac catheterization  11/25/2010    Predilation balloon-Apex monorail 2x65m, Cutting balloon-2.25x141m resulting in a reduction of 100% stenosis down to less than 10%  . Pacemaker  placement  07/14/2010    Temporary placement of pacemaker, if rhythm issue continues will need a permanent device  . Cardiac catheterization  07/12/2010    LAD stented with a 3.5x79m bare-metal Veriflex stent resulting in a reduction of 100% lesion to 0%    No Known Allergies  Current Outpatient Prescriptions    Medication Sig Dispense Refill  . aspirin 81 MG tablet Take 81 mg by mouth daily.    .Marland Kitchenatorvastatin (LIPITOR) 40 MG tablet Take 1 tablet (40 mg total) by mouth daily. 90 tablet 3  . carvedilol (COREG) 25 MG tablet Take 1 tablet (25 mg total) by mouth 2 (two) times daily with a meal. 60 tablet 11  . clomiPHENE (CLOMID) 50 MG tablet Take 25 mg by mouth every other day.    . furosemide (LASIX) 40 MG tablet Take 1 tablet (40 mg total) by mouth 2 (two) times daily. 60 tablet 11  . isosorbide mononitrate (IMDUR) 30 MG 24 hr tablet TAKE ONE TABLET BY MOUTH ONCE DAILY 30 tablet 9  . lisinopril (PRINIVIL,ZESTRIL) 5 MG tablet Take 1 tablet (5 mg total) by mouth daily. 30 tablet 6  . Multiple Vitamin (MULTIVITAMIN) tablet Take 1 tablet by mouth daily.    .Marland KitchenNITROSTAT 0.4 MG SL tablet DISSOLVE ONE TABLET UNDER THE TONGUE EVERY 5 MINUTES AS NEEDED FOR CHEST PAIN.  DO NOT EXCEED A TOTAL OF 3 DOSES IN 15 MINUTES 25 tablet 1  . spironolactone (ALDACTONE) 25 MG tablet Take 0.5 tablets (12.5 mg total) by mouth 2 (two) times daily. 30 tablet 7  . warfarin (COUMADIN) 5 MG tablet Take 1 tablet by mouth daily or as directed by coumadin clinic 30 tablet 5   No current facility-administered medications for this visit.   Social he is single. He has one child. [place] There is a remote tobacco history. He does drink occasional alcohol.   ROS General: Negative; No fevers, chills, or night sweats;  HEENT: Negative; No changes in vision or hearing, sinus congestion, difficulty swallowing Pulmonary: Negative; No cough, wheezing, shortness of breath, hemoptysis Cardiovascular: his previous dizziness has improved; No chest pain, presyncope, syncope, palpitations GI: Negative; No nausea, vomiting, diarrhea, or abdominal pain GU: Positive for erectile dysfunction.; No dysuria, hematuria, or difficulty voiding Musculoskeletal: Positive for history of an inguinal hernia; no myalgias, joint pain, or  weakness Hematologic/Oncology: Negative; no easy bruising, bleeding Endocrine: Negative; no heat/cold intolerance; no diabetes Neuro: Status post recent subdural hematoma evacuation Skin: Negative; No rashes or skin lesions Psychiatric: Negative; No behavioral problems, depression Sleep: Negative; No snoring, daytime sleepiness, hypersomnolence, bruxism, restless legs, hypnogognic hallucinations, no cataplexy Other comprehensive 14 point system review is negative.  PE BP 92/66 mmHg  Pulse 74  Ht 5' 10"  (1.778 m)  Wt 199 lb 12.8 oz (90.629 kg)  BMI 28.67 kg/m2  Repeat blood pressure by me was 104/68 supine and 108/70 standing. General: Alert, oriented, no distress.  HEENT: Normocephalic, atraumatic. Pupils round and reactive; sclera anicteric;no lid lag  Nose without nasal septal hypertrophy Mouth/Parynx benign; Mallinpatti scale 3 Neck: No JVD, no carotid bruits; no hepatojugular reflux Chest wall: No tenderness to palpation Lungs: clear to ausculatation and percussion; no wheezing or rales Heart: RRR, s1 s2 normal 1/6 systolic murmur. No S3 or S4 gallop. No rub thrill or heave. Abdomen: soft, nontender; no hepatosplenomehaly, BS+; abdominal aorta nontender and not dilated by palpation. Back: No CVA tenderness Pulses 2+ Extremities: no clubbing cyanosis or edema, Homan's sign negative  Neurologic: grossly nonfocal; cranial nerves grossly normal Psychological: Normal affect and mood  ECG (independently read by me): Atrially paced rhythm at 74 bpm.  Increased PR interval at 212 ms.  ST-T changes anterolaterally.  November 2015 ECG (independently read by me): Atrially paced rhythm.  Old anterior wall MI.  Per Ms. Lee noted ST-T changes laterally  Prior September 2015 ECG (independently read by me): Sinus rhythm at 63 beats per minute.  Old anterior wall myocardial infarction, poor R. progression V1 through V4 and T-wave changes V3 through V6, leads 1, and L.    08/18/2013 ECG  (independently read by me):  Atrially paced at 64 beats per minute. QTc interval 427 ms. QRS duration 98 ms. Diffuse anterolateral T-wave changes secondary to his prior MI   Prior ECG of 02/27/2013: Atrial  paced rhythm at 64 beats per minute. Evidence for his prior anterolateral wall myocardial infarction with diffuse T-wave abnormality and precordial Q waves V1 through V5  LABS:  BMET  BMP Latest Ref Rng 07/01/2013 12/13/2012 11/27/2010  Glucose 70 - 99 mg/dL 110(H) 77 100(H)  BUN 6 - 23 mg/dL 19 20 27(H)  Creatinine 0.50 - 1.35 mg/dL 1.63(H) 1.67(H) 1.45  Sodium 135 - 145 mEq/L 138 135 136  Potassium 3.5 - 5.3 mEq/L 4.6 4.1 4.0  Chloride 96 - 112 mEq/L 101 103 101  CO2 19 - 32 mEq/L 27 25 23   Calcium 8.4 - 10.5 mg/dL 9.3 9.1 9.2     Hepatic Function Panel   Hepatic Function Latest Ref Rng 07/01/2013 12/13/2012 11/24/2010  Total Protein 6.0 - 8.3 g/dL 7.6 6.9 7.0  Albumin 3.5 - 5.2 g/dL 4.2 4.0 3.4(L)  AST 0 - 37 U/L 23 24 42(H)  ALT 0 - 53 U/L 11 12 27   Alk Phosphatase 39 - 117 U/L 55 55 86  Total Bilirubin 0.3 - 1.2 mg/dL 0.7 0.9 0.7  Bilirubin, Direct 0.0 - 0.3 mg/dL - - -     CBC  CBC Latest Ref Rng 07/01/2013 12/13/2012 11/26/2010  WBC 4.0 - 10.5 K/uL 7.7 5.8 9.3  Hemoglobin 13.0 - 17.0 g/dL 15.6 16.0 13.4  Hematocrit 39.0 - 52.0 % 44.5 45.1 38.1(L)  Platelets 150 - 400 K/uL 191 147(L) 171    BNP    Component Value Date/Time   PROBNP 9719.0* 11/23/2010 2006    Lipid Panel     Component Value Date/Time   CHOL 161 07/01/2013 0838   CHOL 128 12/10/2012 1524   TRIG 108 07/01/2013 0838   TRIG 70 12/10/2012 1524   HDL 43 07/01/2013 0838   HDL 38* 12/10/2012 1524   CHOLHDL 3.4 12/10/2012 1524   VLDL 14 12/10/2012 1524   LDLCALC 96 07/01/2013 0838   LDLCALC 76 12/10/2012 1524     RADIOLOGY: No results found.    ASSESSMENT AND PLAN: Steven. Force is 4 years following his large anterior wall myocardial infarction. A subsequent  cardiac catheterization  on June 2012    showed a patent stent in the LAD but he had an occluded marginal branch of his LCX with collaterals. There also was 80% first diagonal stenosis in a small vessel and 40% RCA stenosis. He does have an ischemic cardiomyopathy.his most recent echo revealed an ejection fraction of 15-20%.  There was akinesis of his apical myocardium, but on this most recent echo there was no mention of any of the previously noted medium-size apical thrombus.  He has been back on low-dose Coumadin therapy.  His INR today is 2.6.  Presently, his blood pressure is in the low-normal range, but he is not orthostatic with blood pressure by me at 104/64 supine, and actually increased to 108/70 standing.  He appears to be well compensated with reference to CHF.  His ECG reveals an atrially paced rhythm with no ectopy.  He has previously noted ST segment changes.  I am recommending repeat laboratory be obtained including a CMP, BNP, TSH, CBC, and lipid profile.  His last optive.  All check on his defibrillator was fairly normal.  His INR is therapeutic.  He's not having any bleeding.  He denies any dizziness.  I will see him in 6 months for reevaluation or sooner if problems arise.   Troy Sine, MD, [facility]  08/31/2014 11:59 AM

## 2014-09-04 LAB — COMPREHENSIVE METABOLIC PANEL
ALBUMIN: 4.1 g/dL (ref 3.5–5.2)
ALK PHOS: 50 U/L (ref 39–117)
ALT: 17 U/L (ref 0–53)
AST: 25 U/L (ref 0–37)
BUN: 15 mg/dL (ref 6–23)
CALCIUM: 8.9 mg/dL (ref 8.4–10.5)
CO2: 25 mEq/L (ref 19–32)
Chloride: 106 mEq/L (ref 96–112)
Creat: 1.44 mg/dL — ABNORMAL HIGH (ref 0.50–1.35)
GLUCOSE: 84 mg/dL (ref 70–99)
Potassium: 4.4 mEq/L (ref 3.5–5.3)
Sodium: 140 mEq/L (ref 135–145)
Total Bilirubin: 0.5 mg/dL (ref 0.2–1.2)
Total Protein: 6.8 g/dL (ref 6.0–8.3)

## 2014-09-04 LAB — CBC
HCT: 46.7 % (ref 39.0–52.0)
Hemoglobin: 15.9 g/dL (ref 13.0–17.0)
MCH: 31.8 pg (ref 26.0–34.0)
MCHC: 34 g/dL (ref 30.0–36.0)
MCV: 93.4 fL (ref 78.0–100.0)
MPV: 9.8 fL (ref 8.6–12.4)
Platelets: 165 10*3/uL (ref 150–400)
RBC: 5 MIL/uL (ref 4.22–5.81)
RDW: 13.8 % (ref 11.5–15.5)
WBC: 6.2 10*3/uL (ref 4.0–10.5)

## 2014-09-04 LAB — BRAIN NATRIURETIC PEPTIDE: Brain Natriuretic Peptide: 124.2 pg/mL — ABNORMAL HIGH (ref 0.0–100.0)

## 2014-09-04 LAB — TSH: TSH: 1.5 u[IU]/mL (ref 0.350–4.500)

## 2014-09-04 LAB — LIPID PANEL
Cholesterol: 115 mg/dL (ref 0–200)
HDL: 35 mg/dL — ABNORMAL LOW (ref >=40)
LDL Cholesterol: 64 mg/dL (ref 0–99)
Total CHOL/HDL Ratio: 3.3 Ratio
Triglycerides: 80 mg/dL (ref <150)
VLDL: 16 mg/dL (ref 0–40)

## 2014-09-14 ENCOUNTER — Other Ambulatory Visit: Payer: Self-pay | Admitting: Cardiovascular Disease

## 2014-09-14 NOTE — Telephone Encounter (Signed)
Rx has been sent to the pharmacy electronically. ° °

## 2014-09-22 ENCOUNTER — Encounter: Payer: Self-pay | Admitting: *Deleted

## 2014-09-22 NOTE — Progress Notes (Signed)
Note sent to patient

## 2014-09-23 ENCOUNTER — Other Ambulatory Visit: Payer: Self-pay | Admitting: Cardiovascular Disease

## 2014-09-23 NOTE — Telephone Encounter (Signed)
Rx(s) sent to pharmacy electronically.  

## 2014-09-30 ENCOUNTER — Ambulatory Visit (INDEPENDENT_AMBULATORY_CARE_PROVIDER_SITE_OTHER): Payer: Medicaid Other | Admitting: Pharmacist Clinician (PhC)/ Clinical Pharmacy Specialist

## 2014-09-30 DIAGNOSIS — Z7901 Long term (current) use of anticoagulants: Secondary | ICD-10-CM

## 2014-09-30 DIAGNOSIS — I2109 ST elevation (STEMI) myocardial infarction involving other coronary artery of anterior wall: Secondary | ICD-10-CM | POA: Diagnosis not present

## 2014-09-30 DIAGNOSIS — I513 Intracardiac thrombosis, not elsewhere classified: Secondary | ICD-10-CM

## 2014-09-30 LAB — POCT INR: INR: 1.5

## 2014-10-15 ENCOUNTER — Ambulatory Visit (INDEPENDENT_AMBULATORY_CARE_PROVIDER_SITE_OTHER): Payer: Medicaid Other | Admitting: Pharmacist Clinician (PhC)/ Clinical Pharmacy Specialist

## 2014-10-15 DIAGNOSIS — I513 Intracardiac thrombosis, not elsewhere classified: Secondary | ICD-10-CM

## 2014-10-15 DIAGNOSIS — I2109 ST elevation (STEMI) myocardial infarction involving other coronary artery of anterior wall: Secondary | ICD-10-CM

## 2014-10-15 DIAGNOSIS — Z7901 Long term (current) use of anticoagulants: Secondary | ICD-10-CM

## 2014-10-15 LAB — POCT INR: INR: 2.5

## 2014-10-18 NOTE — Op Note (Signed)
PATIENT NAME:  Steven [name], Steven [name] MR#:  [id number] DATE OF BIRTH:  1956-02-24  DATE OF PROCEDURE:  12/26/2011  PREOPERATIVE DIAGNOSIS: Left inguinal hernia.   POSTOPERATIVE DIAGNOSIS: Left inguinal hernia.   OPERATION: Left inguinal hernia with mesh.   SURGEON: Maryse Brierley S. Shadee Rathod, MD   FINDINGS: This patient had an indirect hernia with very thickened hernia sac which was going all the way down to his testes.   DESCRIPTION OF PROCEDURE: Under general anesthesia, the left groin was then prepped and draped and a small incision was made. After cutting skin and subcutaneous tissue, fascia was reached. Fascia was then opened and the cord structures were lifted up on a Penrose drain. The patient was found to have a very thickened hernia sac and also it was very chronically inflamed tissue. It was difficult to dissect off the vas as well as the blood vessels. Finally, I divided the sac in the middle and proximally and I traced it down to the internal ring and put my finger in the belly and there was nothing. No bowel was stuck. It was then high ligated. After that the distal sac was left alone. Prolene mesh was then put in behind the cord and sutured around the cord and to the shelving edge of the inguinal ligament and also to the conjoined tendon and around the cord. The external oblique was then closed on top of it and new external ring was then made. After that, fascia was closed interruptedly with 0 Vicryl sutures and subcuticular suturing was done with 3-0 Vicryl. Skin was closed with staples and Marcaine was injected. The patient tolerated the procedure well and was sent to the recovery room in satisfactory condition.   ____________________________ Welford Roche Phylis Bougie, MD msh:drc D: 12/26/2011 13:29:32 ET T: 12/26/2011 14:19:31 ET JOB#: [id number]  cc: Kalea Perine S. Phylis Bougie, MD, <Dictator> Sharene Butters MD ELECTRONICALLY SIGNED 01/02/2012 8:40

## 2014-10-26 ENCOUNTER — Other Ambulatory Visit: Payer: Self-pay | Admitting: Cardiovascular Disease

## 2014-10-26 NOTE — Telephone Encounter (Signed)
Lisinopril refills #30 with 10 refills 09/23/2014

## 2014-11-10 ENCOUNTER — Ambulatory Visit (INDEPENDENT_AMBULATORY_CARE_PROVIDER_SITE_OTHER): Payer: Medicaid Other | Admitting: *Deleted

## 2014-11-10 ENCOUNTER — Telehealth: Payer: Self-pay | Admitting: Cardiology

## 2014-11-10 DIAGNOSIS — I255 Ischemic cardiomyopathy: Secondary | ICD-10-CM | POA: Diagnosis not present

## 2014-11-10 NOTE — Progress Notes (Signed)
Remote ICD transmission.   

## 2014-11-10 NOTE — Telephone Encounter (Signed)
Spoke with pt and reminded pt of remote transmission that is due today. Pt verbalized understanding.   

## 2014-11-11 ENCOUNTER — Ambulatory Visit (INDEPENDENT_AMBULATORY_CARE_PROVIDER_SITE_OTHER): Payer: Medicaid Other | Admitting: Pharmacist Clinician (PhC)/ Clinical Pharmacy Specialist

## 2014-11-11 DIAGNOSIS — I2109 ST elevation (STEMI) myocardial infarction involving other coronary artery of anterior wall: Secondary | ICD-10-CM

## 2014-11-11 DIAGNOSIS — Z7901 Long term (current) use of anticoagulants: Secondary | ICD-10-CM | POA: Diagnosis not present

## 2014-11-11 DIAGNOSIS — I513 Intracardiac thrombosis, not elsewhere classified: Secondary | ICD-10-CM

## 2014-11-11 LAB — POCT INR: INR: 3.3

## 2014-11-13 LAB — CUP PACEART REMOTE DEVICE CHECK
Battery Voltage: 3.09 V
Brady Statistic AP VP Percent: 0.03 %
Brady Statistic AP VS Percent: 81.17 %
Brady Statistic AS VP Percent: 0.01 %
Brady Statistic AS VS Percent: 18.8 %
Brady Statistic RA Percent Paced: 81.2 %
Brady Statistic RV Percent Paced: 0.04 %
HIGH POWER IMPEDANCE MEASURED VALUE: 44 Ohm
HIGH POWER IMPEDANCE MEASURED VALUE: 59 Ohm
HighPow Impedance: 380 Ohm
Lead Channel Impedance Value: 380 Ohm
Lead Channel Pacing Threshold Pulse Width: 0.4 ms
Lead Channel Pacing Threshold Pulse Width: 0.4 ms
Lead Channel Sensing Intrinsic Amplitude: 1.75 mV
Lead Channel Sensing Intrinsic Amplitude: 1.75 mV
Lead Channel Sensing Intrinsic Amplitude: 8.25 mV
Lead Channel Sensing Intrinsic Amplitude: 8.25 mV
Lead Channel Setting Pacing Amplitude: 2 V
Lead Channel Setting Pacing Pulse Width: 0.4 ms
MDC IDC MSMT LEADCHNL RA IMPEDANCE VALUE: 380 Ohm
MDC IDC MSMT LEADCHNL RA PACING THRESHOLD AMPLITUDE: 0.375 V
MDC IDC MSMT LEADCHNL RV PACING THRESHOLD AMPLITUDE: 0.625 V
MDC IDC SESS DTM: 20160517164235
MDC IDC SET LEADCHNL RA PACING AMPLITUDE: 1.5 V
MDC IDC SET LEADCHNL RV SENSING SENSITIVITY: 0.3 mV
MDC IDC SET ZONE DETECTION INTERVAL: 300 ms
Zone Setting Detection Interval: 350 ms
Zone Setting Detection Interval: 360 ms
Zone Setting Detection Interval: 370 ms

## 2014-11-19 ENCOUNTER — Encounter: Payer: Self-pay | Admitting: Cardiology

## 2014-12-03 ENCOUNTER — Ambulatory Visit (INDEPENDENT_AMBULATORY_CARE_PROVIDER_SITE_OTHER): Payer: Medicaid Other | Admitting: Pharmacist Clinician (PhC)/ Clinical Pharmacy Specialist

## 2014-12-03 DIAGNOSIS — I2109 ST elevation (STEMI) myocardial infarction involving other coronary artery of anterior wall: Secondary | ICD-10-CM

## 2014-12-03 DIAGNOSIS — Z7901 Long term (current) use of anticoagulants: Secondary | ICD-10-CM

## 2014-12-03 DIAGNOSIS — I513 Intracardiac thrombosis, not elsewhere classified: Secondary | ICD-10-CM

## 2014-12-03 LAB — POCT INR: INR: 4.2

## 2014-12-08 ENCOUNTER — Encounter: Payer: Self-pay | Admitting: Cardiovascular Disease

## 2014-12-09 ENCOUNTER — Encounter: Payer: Self-pay | Admitting: Cardiovascular Disease

## 2014-12-15 ENCOUNTER — Other Ambulatory Visit: Payer: Self-pay | Admitting: *Deleted

## 2014-12-15 MED ORDER — WARFARIN SODIUM 5 MG PO TABS: ORAL_TABLET | ORAL | Status: DC

## 2014-12-15 NOTE — Telephone Encounter (Signed)
Rx(s) sent to pharmacy electronically.  

## 2014-12-16 ENCOUNTER — Ambulatory Visit (INDEPENDENT_AMBULATORY_CARE_PROVIDER_SITE_OTHER): Payer: Medicaid Other | Admitting: Pharmacist

## 2014-12-16 DIAGNOSIS — Z7901 Long term (current) use of anticoagulants: Secondary | ICD-10-CM

## 2014-12-16 DIAGNOSIS — I2109 ST elevation (STEMI) myocardial infarction involving other coronary artery of anterior wall: Secondary | ICD-10-CM

## 2014-12-16 DIAGNOSIS — I513 Intracardiac thrombosis, not elsewhere classified: Secondary | ICD-10-CM

## 2014-12-16 LAB — POCT INR: INR: 2.2

## 2015-01-01 ENCOUNTER — Ambulatory Visit (INDEPENDENT_AMBULATORY_CARE_PROVIDER_SITE_OTHER): Payer: Medicaid Other | Admitting: Pharmacist Clinician (PhC)/ Clinical Pharmacy Specialist

## 2015-01-01 DIAGNOSIS — Z7901 Long term (current) use of anticoagulants: Secondary | ICD-10-CM | POA: Diagnosis not present

## 2015-01-01 DIAGNOSIS — I2109 ST elevation (STEMI) myocardial infarction involving other coronary artery of anterior wall: Secondary | ICD-10-CM | POA: Diagnosis not present

## 2015-01-01 DIAGNOSIS — I513 Intracardiac thrombosis, not elsewhere classified: Secondary | ICD-10-CM

## 2015-01-01 LAB — POCT INR: INR: 2.8

## 2015-01-25 ENCOUNTER — Other Ambulatory Visit: Payer: Self-pay | Admitting: Cardiovascular Disease

## 2015-01-29 ENCOUNTER — Ambulatory Visit (INDEPENDENT_AMBULATORY_CARE_PROVIDER_SITE_OTHER): Payer: Medicaid Other | Admitting: Pharmacist Clinician (PhC)/ Clinical Pharmacy Specialist

## 2015-01-29 DIAGNOSIS — I2109 ST elevation (STEMI) myocardial infarction involving other coronary artery of anterior wall: Secondary | ICD-10-CM | POA: Diagnosis not present

## 2015-01-29 DIAGNOSIS — Z7901 Long term (current) use of anticoagulants: Secondary | ICD-10-CM

## 2015-01-29 DIAGNOSIS — I513 Intracardiac thrombosis, not elsewhere classified: Secondary | ICD-10-CM

## 2015-01-29 LAB — POCT INR: INR: 2

## 2015-02-10 ENCOUNTER — Ambulatory Visit (INDEPENDENT_AMBULATORY_CARE_PROVIDER_SITE_OTHER): Payer: Medicaid Other | Admitting: *Deleted

## 2015-02-10 DIAGNOSIS — I255 Ischemic cardiomyopathy: Secondary | ICD-10-CM

## 2015-02-10 NOTE — Progress Notes (Signed)
Remote ICD transmission.   

## 2015-02-18 LAB — CUP PACEART REMOTE DEVICE CHECK
Brady Statistic AP VS Percent: 79.82 %
Brady Statistic AS VP Percent: 0.01 %
Brady Statistic AS VS Percent: 20.15 %
Date Time Interrogation Session: 20160817062608
HIGH POWER IMPEDANCE MEASURED VALUE: 71 Ohm
HighPow Impedance: 399 Ohm
HighPow Impedance: 51 Ohm
Lead Channel Impedance Value: 437 Ohm
Lead Channel Pacing Threshold Amplitude: 0.625 V
Lead Channel Pacing Threshold Pulse Width: 0.4 ms
Lead Channel Pacing Threshold Pulse Width: 0.4 ms
Lead Channel Sensing Intrinsic Amplitude: 1.375 mV
Lead Channel Sensing Intrinsic Amplitude: 7.375 mV
MDC IDC MSMT BATTERY VOLTAGE: 3.08 V
MDC IDC MSMT LEADCHNL RA PACING THRESHOLD AMPLITUDE: 0.375 V
MDC IDC MSMT LEADCHNL RA SENSING INTR AMPL: 1.375 mV
MDC IDC MSMT LEADCHNL RV IMPEDANCE VALUE: 437 Ohm
MDC IDC MSMT LEADCHNL RV SENSING INTR AMPL: 7.375 mV
MDC IDC SET LEADCHNL RA PACING AMPLITUDE: 1.5 V
MDC IDC SET LEADCHNL RV PACING AMPLITUDE: 2 V
MDC IDC SET LEADCHNL RV PACING PULSEWIDTH: 0.4 ms
MDC IDC SET LEADCHNL RV SENSING SENSITIVITY: 0.3 mV
MDC IDC SET ZONE DETECTION INTERVAL: 350 ms
MDC IDC SET ZONE DETECTION INTERVAL: 370 ms
MDC IDC STAT BRADY AP VP PERCENT: 0.03 %
MDC IDC STAT BRADY RA PERCENT PACED: 79.85 %
MDC IDC STAT BRADY RV PERCENT PACED: 0.04 %
Zone Setting Detection Interval: 300 ms
Zone Setting Detection Interval: 360 ms

## 2015-02-25 ENCOUNTER — Ambulatory Visit (INDEPENDENT_AMBULATORY_CARE_PROVIDER_SITE_OTHER): Payer: Medicaid Other | Admitting: Pharmacist Clinician (PhC)/ Clinical Pharmacy Specialist

## 2015-02-25 DIAGNOSIS — I2109 ST elevation (STEMI) myocardial infarction involving other coronary artery of anterior wall: Secondary | ICD-10-CM

## 2015-02-25 DIAGNOSIS — Z7901 Long term (current) use of anticoagulants: Secondary | ICD-10-CM | POA: Diagnosis not present

## 2015-02-25 DIAGNOSIS — I513 Intracardiac thrombosis, not elsewhere classified: Secondary | ICD-10-CM

## 2015-02-25 LAB — POCT INR: INR: 2.7

## 2015-03-08 ENCOUNTER — Encounter: Payer: Self-pay | Admitting: Cardiology

## 2015-03-19 ENCOUNTER — Encounter: Payer: Self-pay | Admitting: Cardiovascular Disease

## 2015-03-22 ENCOUNTER — Ambulatory Visit (INDEPENDENT_AMBULATORY_CARE_PROVIDER_SITE_OTHER): Payer: Medicaid Other | Admitting: Cardiovascular Disease

## 2015-03-22 ENCOUNTER — Ambulatory Visit (INDEPENDENT_AMBULATORY_CARE_PROVIDER_SITE_OTHER): Payer: Medicaid Other | Admitting: Pharmacist Clinician (PhC)/ Clinical Pharmacy Specialist

## 2015-03-22 VITALS — BP 102/72 | HR 64 | Ht 70.0 in | Wt 202.1 lb

## 2015-03-22 DIAGNOSIS — E785 Hyperlipidemia, unspecified: Secondary | ICD-10-CM | POA: Diagnosis not present

## 2015-03-22 DIAGNOSIS — I255 Ischemic cardiomyopathy: Secondary | ICD-10-CM | POA: Diagnosis not present

## 2015-03-22 DIAGNOSIS — I2510 Atherosclerotic heart disease of native coronary artery without angina pectoris: Secondary | ICD-10-CM

## 2015-03-22 DIAGNOSIS — I2109 ST elevation (STEMI) myocardial infarction involving other coronary artery of anterior wall: Secondary | ICD-10-CM

## 2015-03-22 DIAGNOSIS — Z7901 Long term (current) use of anticoagulants: Secondary | ICD-10-CM

## 2015-03-22 DIAGNOSIS — Z95810 Presence of automatic (implantable) cardiac defibrillator: Secondary | ICD-10-CM

## 2015-03-22 DIAGNOSIS — I25810 Atherosclerosis of coronary artery bypass graft(s) without angina pectoris: Secondary | ICD-10-CM | POA: Diagnosis not present

## 2015-03-22 DIAGNOSIS — I513 Intracardiac thrombosis, not elsewhere classified: Secondary | ICD-10-CM

## 2015-03-22 LAB — POCT INR: INR: 2.1

## 2015-03-22 MED ORDER — FUROSEMIDE 40 MG PO TABS: 40.0000 mg | ORAL_TABLET | Freq: Every day | ORAL | Status: DC

## 2015-03-22 NOTE — Patient Instructions (Addendum)
Your physician has recommended you make the following change in your medication: decrease the furosemide back to 40 mg once a day. If you notice shortness of breathe then increase back to 40 mg in the morning and 20 mg in the evening.  Your physician wants you to follow-up in: 6 months or sooner if needed. You will receive a reminder letter in the mail two months in advance. If you don't receive a letter, please call our office to schedule the follow-up appointment.

## 2015-03-24 ENCOUNTER — Encounter: Payer: Self-pay | Admitting: Cardiovascular Disease

## 2015-03-24 NOTE — Progress Notes (Signed)
Patient ID: Steven [name], male   DOB: [birth year], 59 y.o.   MRN: [id number]   HPI: Steven [name] is a 59 y.o. male who presents to the office today for a 6 month follow-up cardiology evaluation.  Steven [name] is originally from [place] and suffered a large out of hospital anterior wall myocardial infarction in January 2012. Steven [name]  He presented after a three-hour delay to the hospital and was taken emergently to the cardiac catheterization laboratory where I performed successful emergent intervention with the door to balloon time of only 25 minutes. His LAD was successfully recanalized. CPK increased to 8012 with an MB of 716 and troponin was greater than 100 initially. He subsequently wore a life vest and ultimately after several months without significant improvement of LV function he underwent implantable cardio defibrillator insertion.  Interrogation of his defibrillator in the past had shown episodes of nonsustained VT and his dose of carvedilol has been further titrated.  A followup echo Doppler study on 08/04/2013 showed an ejection fraction of 20-25% with diffuse hypokinesis but there was akinesis of the entire anteroseptal myocardium and apical region. There now appear to be an apparent medium-sized mural apical thrombus which was not noted on his last echo. He denies paresthesias. He denies episodes of chest pain. He denies shortness of breath.At his last office visit in February in light of his moderate sized mural thrombus, Plavix, was discontinued, and he was started on Coumadin therapy.  In August 2015 he tripped and hit his head on a bar recently developed severe headache, transient leg weakness, which evolves into slurred speech.  He underwent evaluation 1-1/2 weeks later and was found  to have a subdural hematoma.  He underwent a right burr hole subdural hematoma evacuation on 02/05/2014 by [name] at [facility].  Initially aspirin and Coumadin were on hold.  He had a follow-up CT  scan and ultimately was given clearance to reinstitute Coumadin anticoagulation in light of his severe cardiomyopathy with an ejection fraction of 15-20% on echo on 03/13/2014 and medium-sized apical mural thrombus which had been documented on a previous echo.  This ultimately has stabilized and he has been back on warfarin anticoagulation.    Presently, he denies any chest pain.  He denies shortness of breath.  He's unaware of any recurrent palpitations.  His ICD was  last interrogated one month ago by Dr. [name].  He was noted to have very short nonsustained bursts of nonsustained VT with one episode lasting 8 beats at 212 bpm on 07/09/2014.  His device was adjusted so as it would intervene more quickly and avoid syncope.    Since I last saw him, he denies any episodes of chest pain or shortness of breath.  He denies palpitations.  He has not been having significant edema.  He denies any defibrillator discharges.  He presents today for follow-up evaluation.  Past Medical History  Diagnosis Date  . Acute MI, anterior wall   . Inguinal hernia, left   . CAD (coronary artery disease)     2D ECHO, 07/13/2011 - EF <25%, LV moderatelty dilated, LA moderately dilatedLEXISCAN, 12/14/2011 - moderate-severe perfusion defect seen in the basal anteroseptal, mid anterior, apicacl anterior, apical, apical inferior, and apical lateral regions, post-stress EF 25%, new EKG changes from baseline abnormalities    Past Surgical History  Procedure Laterality Date  . Cardiac defibrillator placement  03/02/2011    Medtronic Protecta XT DR, model [id number], serial [id number]  . Cardiac catheterization  11/25/2010    Predilation balloon-Apex monorail 2x46m, Cutting balloon-2.25x14m resulting in a reduction of 100% stenosis down to less than 10%  . Pacemaker placement  07/14/2010    Temporary placement of pacemaker, if rhythm issue continues will need a permanent device  . Cardiac catheterization  07/12/2010    LAD stented with  a 3.5x2475mare-metal Veriflex stent resulting in a reduction of 100% lesion to 0%    No Known Allergies  Current Outpatient Prescriptions  Medication Sig Dispense Refill  . aspirin 81 MG tablet Take 81 mg by mouth daily.    . aMarland Kitchenorvastatin (LIPITOR) 40 MG tablet Take 1 tablet (40 mg total) by mouth daily. 90 tablet 3  . carvedilol (COREG) 25 MG tablet Take 1 tablet (25 mg total) by mouth 2 (two) times daily with a meal. 60 tablet 11  . clomiPHENE (CLOMID) 50 MG tablet Take 25 mg by mouth every other day.    . furosemide (LASIX) 40 MG tablet Take 1 tablet (40 mg total) by mouth daily. If you notice SOB then take 40 in the morning and 20 mg on the evening (1/2 tablet) 60 tablet 11  . isosorbide mononitrate (IMDUR) 30 MG 24 hr tablet TAKE ONE TABLET BY MOUTH ONCE DAILY 30 tablet 9  . lisinopril (PRINIVIL,ZESTRIL) 5 MG tablet TAKE ONE TABLET BY MOUTH ONCE DAILY 30 tablet 11  . Multiple Vitamin (MULTIVITAMIN) tablet Take 1 tablet by mouth daily.    . NMarland KitchenTROSTAT 0.4 MG SL tablet DISSOLVE ONE TABLET UNDER THE TONGUE EVERY 5 MINUTES AS NEEDED FOR CHEST PAIN.  DO NOT EXCEED A TOTAL OF 3 DOSES IN 15 MINUTES 25 tablet 1  . spironolactone (ALDACTONE) 25 MG tablet TAKE ONE-HALF TABLET BY MOUTH TWICE DAILY 30 tablet 11  . warfarin (COUMADIN) 5 MG tablet TAKE ONE TABLET BY MOUTH ONCE DAILY OR AS DIRECTED BY COUMADIN CLINIC 30 tablet 3   No current facility-administered medications for this visit.   Social he is single. He has one child. [place]. He does drink occasional alcohol.   ROS General: Negative; No fevers, chills, or night sweats;  HEENT: Negative; No changes in vision or hearing, sinus congestion, difficulty swallowing Pulmonary: Negative; No cough, wheezing, shortness of breath, hemoptysis Cardiovascular: his previous dizziness has improved; No chest pain, presyncope, syncope, palpitations GI: Negative; No nausea, vomiting, diarrhea, or  abdominal pain GU: Positive for erectile dysfunction.; No dysuria, hematuria, or difficulty voiding Musculoskeletal: Positive for history of an inguinal hernia; no myalgias, joint pain, or weakness Hematologic/Oncology: Negative; no easy bruising, bleeding Endocrine: Negative; no heat/cold intolerance; no diabetes Neuro: Status post recent subdural hematoma evacuation Skin: Negative; No rashes or skin lesions Psychiatric: Negative; No behavioral problems, depression Sleep: Negative; No snoring, daytime sleepiness, hypersomnolence, bruxism, restless legs, hypnogognic hallucinations, no cataplexy Other comprehensive 14 point system review is negative.  PE BP 102/72 mmHg  Pulse 64  Ht 5' 10"  (1.778 m)  Wt 202 lb 1.6 oz (91.672 kg)  BMI 29.00 kg/m2  Repeat blood pressure by me was 104/68 without orthostatic change  Wt Readings from Last 3 Encounters:  03/22/15 202 lb 1.6 oz (91.672 kg)  08/31/14 199 lb 12.8 oz (90.629 kg)  08/11/14 195 lb 11.2 oz (88.769 kg)   General: Alert, oriented, no distress.  HEENT: Normocephalic, atraumatic. Pupils round and reactive; sclera anicteric;no lid lag  Nose without nasal septal hypertrophy Mouth/Parynx benign; Mallinpatti scale 3 Neck: No JVD, no carotid bruits; no  hepatojugular reflux Chest wall: No tenderness to palpation Lungs: clear to ausculatation and percussion; no wheezing or rales Heart: RRR, s1 s2 normal 1/6 systolic murmur. No S3 or S4 gallop. No rub thrill or heave. Abdomen: soft, nontender; no hepatosplenomehaly, BS+; abdominal aorta nontender and not dilated by palpation. Back: No CVA tenderness Pulses 2+ Extremities: no clubbing cyanosis or edema, Homan's sign negative  Neurologic: grossly nonfocal; cranial nerves grossly normal Psychological: Normal affect and mood  ECG (independently read by me): Atrially paced rhythm at 64 bpm.  No ectopy.  Poor anterior R-wave progression concordant with old anterior MI.  March 2016 ECG  (independently read by me): Atrially paced rhythm at 74 bpm.  Increased PR interval at 212 ms.  ST-T changes anterolaterally.  November 2015 ECG (independently read by me): Atrially paced rhythm.  Old anterior wall MI.  Per Ms. Lee noted ST-T changes laterally  Prior September 2015 ECG (independently read by me): Sinus rhythm at 63 beats per minute.  Old anterior wall myocardial infarction, poor R. progression V1 through V4 and T-wave changes V3 through V6, leads 1, and L.    08/18/2013 ECG (independently read by me):  Atrially paced at 64 beats per minute. QTc interval 427 ms. QRS duration 98 ms. Diffuse anterolateral T-wave changes secondary to his prior MI   Prior ECG of 02/27/2013: Atrial  paced rhythm at 64 beats per minute. Evidence for his prior anterolateral wall myocardial infarction with diffuse T-wave abnormality and precordial Q waves V1 through V5  LABS:  BMP Latest Ref Rng 09/03/2014 07/01/2013 12/13/2012  Glucose 70 - 99 mg/dL 84 110(H) 77  BUN 6 - 23 mg/dL 15 19 20   Creatinine 0.50 - 1.35 mg/dL 1.44(H) 1.63(H) 1.67(H)  Sodium 135 - 145 mEq/L 140 138 135  Potassium 3.5 - 5.3 mEq/L 4.4 4.6 4.1  Chloride 96 - 112 mEq/L 106 101 103  CO2 19 - 32 mEq/L 25 27 25   Calcium 8.4 - 10.5 mg/dL 8.9 9.3 9.1    Hepatic Function Latest Ref Rng 09/03/2014 07/01/2013 12/13/2012  Total Protein 6.0 - 8.3 g/dL 6.8 7.6 6.9  Albumin 3.5 - 5.2 g/dL 4.1 4.2 4.0  AST 0 - 37 U/L 25 23 24   ALT 0 - 53 U/L 17 11 12   Alk Phosphatase 39 - 117 U/L 50 55 55  Total Bilirubin 0.2 - 1.2 mg/dL 0.5 0.7 0.9  Bilirubin, Direct 0.0 - 0.3 mg/dL - - -    CBC Latest Ref Rng 09/03/2014 07/01/2013 12/13/2012  WBC 4.0 - 10.5 K/uL 6.2 7.7 5.8  Hemoglobin 13.0 - 17.0 g/dL 15.9 15.6 16.0  Hematocrit 39.0 - 52.0 % 46.7 44.5 45.1  Platelets 150 - 400 K/uL 165 191 147(L)   Lab Results  Component Value Date   MCV 93.4 09/03/2014   MCV 90.4 07/01/2013   MCV 90.4 12/13/2012   Lab Results  Component Value Date   TSH 1.500  09/03/2014   Lab Results  Component Value Date   HGBA1C * 11/23/2010    5.8 (NOTE)                                                                       According to the ADA Clinical Practice Recommendations for 2011, when  HbA1c is used as a screening test:   >=6.5%   Diagnostic of Diabetes Mellitus           (if abnormal result  is confirmed)  5.7-6.4%   Increased risk of developing Diabetes Mellitus  References:Diagnosis and Classification of Diabetes Mellitus,Diabetes DGUY,4034,74(QVZDG 1):S62-S69 and Standards of Medical Care in         Diabetes - 2011,Diabetes LOVF,6433,29  (Suppl 1):S11-S61.  ipid Panel     Component Value Date/Time   CHOL 115 09/03/2014 0803   CHOL 161 07/01/2013 0838   TRIG 80 09/03/2014 0803   TRIG 108 07/01/2013 0838   HDL 35* 09/03/2014 0803   HDL 43 07/01/2013 0838   CHOLHDL 3.3 09/03/2014 0803   VLDL 16 09/03/2014 0803   LDLCALC 64 09/03/2014 0803   LDLCALC 96 07/01/2013 0838    INR today 2.1  RADIOLOGY: No results found.    ASSESSMENT AND PLAN: Steven. Mees is a 46 African  male who is 4 1/2 years following his large anterior wall myocardial infarction. A subsequent  cardiac catheterization  on June 2012 showed a patent stent in the LAD but he had an occluded marginal branch of his LCX with collaterals. There also was 80% first diagonal stenosis in a small vessel and 40% RCA stenosis. He does have an ischemic cardiomyopathy.his most recent echo revealed an ejection fraction of 15-20%.  There was akinesis of his apical myocardium, but on this most recent echo there was no mention of any of the previously noted medium-size apical thrombus.  He has been back on low-dose Coumadin therapy.  He denies any recurrent anginal symptoms.  His INR today is therapeutic at 2.1.  He's not having any palpitations or awareness of arrhythmia.  His blood pressure is low.  There are no signs of edema.  I will reduce his furosemide 40 mg twice a day to just 40 mg  daily.  He's not having any signs of bleeding.  He is on lipid lowering therapy with atorvastatin.  He is atrially paced with 100% atrial capture and ventricular sensing.  I will see him in 6 months for cardiology reevaluation.  Troy Sine, MD, [facility]  03/24/2015 1:40 PM

## 2015-04-21 ENCOUNTER — Ambulatory Visit (INDEPENDENT_AMBULATORY_CARE_PROVIDER_SITE_OTHER): Payer: Medicaid Other | Admitting: Pharmacist Clinician (PhC)/ Clinical Pharmacy Specialist

## 2015-04-21 DIAGNOSIS — Z7901 Long term (current) use of anticoagulants: Secondary | ICD-10-CM

## 2015-04-21 DIAGNOSIS — I213 ST elevation (STEMI) myocardial infarction of unspecified site: Secondary | ICD-10-CM

## 2015-04-21 DIAGNOSIS — I513 Intracardiac thrombosis, not elsewhere classified: Secondary | ICD-10-CM

## 2015-04-21 LAB — POCT INR: INR: 2.2

## 2015-05-17 ENCOUNTER — Other Ambulatory Visit: Payer: Self-pay | Admitting: Cardiovascular Disease

## 2015-05-17 ENCOUNTER — Ambulatory Visit (INDEPENDENT_AMBULATORY_CARE_PROVIDER_SITE_OTHER): Payer: Medicaid Other | Admitting: *Deleted

## 2015-05-17 DIAGNOSIS — I255 Ischemic cardiomyopathy: Secondary | ICD-10-CM | POA: Diagnosis not present

## 2015-05-17 NOTE — Progress Notes (Signed)
Remote ICD transmission.   

## 2015-05-18 ENCOUNTER — Encounter: Payer: Self-pay | Admitting: Cardiology

## 2015-05-18 LAB — CUP PACEART REMOTE DEVICE CHECK
Battery Voltage: 3.06 V
Brady Statistic AP VS Percent: 72.71 %
Brady Statistic AS VP Percent: 0.01 %
Brady Statistic AS VS Percent: 27.26 %
Date Time Interrogation Session: 20161121081709
HIGH POWER IMPEDANCE MEASURED VALUE: 64 Ohm
HighPow Impedance: 399 Ohm
HighPow Impedance: 47 Ohm
Implantable Lead Implant Date: 20120906
Implantable Lead Location: 753860
Implantable Lead Model: 5076
Lead Channel Impedance Value: 399 Ohm
Lead Channel Pacing Threshold Amplitude: 0.625 V
Lead Channel Pacing Threshold Pulse Width: 0.4 ms
Lead Channel Pacing Threshold Pulse Width: 0.4 ms
Lead Channel Setting Pacing Amplitude: 1.5 V
Lead Channel Setting Pacing Amplitude: 2 V
Lead Channel Setting Sensing Sensitivity: 0.3 mV
MDC IDC LEAD IMPLANT DT: 20120906
MDC IDC LEAD LOCATION: 753859
MDC IDC LEAD MODEL: 185
MDC IDC LEAD SERIAL: 358872
MDC IDC MSMT LEADCHNL RA PACING THRESHOLD AMPLITUDE: 0.5 V
MDC IDC MSMT LEADCHNL RA SENSING INTR AMPL: 1.5 mV
MDC IDC MSMT LEADCHNL RA SENSING INTR AMPL: 1.5 mV
MDC IDC MSMT LEADCHNL RV IMPEDANCE VALUE: 399 Ohm
MDC IDC MSMT LEADCHNL RV SENSING INTR AMPL: 7.5 mV
MDC IDC MSMT LEADCHNL RV SENSING INTR AMPL: 7.5 mV
MDC IDC SET LEADCHNL RV PACING PULSEWIDTH: 0.4 ms
MDC IDC STAT BRADY AP VP PERCENT: 0.02 %
MDC IDC STAT BRADY RA PERCENT PACED: 72.73 %
MDC IDC STAT BRADY RV PERCENT PACED: 0.04 %

## 2015-05-19 ENCOUNTER — Ambulatory Visit (INDEPENDENT_AMBULATORY_CARE_PROVIDER_SITE_OTHER): Payer: Medicaid Other | Admitting: Pharmacist Clinician (PhC)/ Clinical Pharmacy Specialist

## 2015-05-19 ENCOUNTER — Other Ambulatory Visit: Payer: Self-pay | Admitting: *Deleted

## 2015-05-19 DIAGNOSIS — I513 Intracardiac thrombosis, not elsewhere classified: Secondary | ICD-10-CM

## 2015-05-19 DIAGNOSIS — I213 ST elevation (STEMI) myocardial infarction of unspecified site: Secondary | ICD-10-CM | POA: Diagnosis not present

## 2015-05-19 DIAGNOSIS — Z7901 Long term (current) use of anticoagulants: Secondary | ICD-10-CM | POA: Diagnosis not present

## 2015-05-19 LAB — POCT INR: INR: 2

## 2015-05-19 MED ORDER — ATORVASTATIN CALCIUM 40 MG PO TABS: 40.0000 mg | ORAL_TABLET | Freq: Every day | ORAL | Status: DC

## 2015-06-29 ENCOUNTER — Telehealth: Payer: Self-pay | Admitting: Cardiovascular Disease

## 2015-06-29 MED ORDER — ISOSORBIDE MONONITRATE ER 30 MG PO TB24: 30.0000 mg | ORAL_TABLET | Freq: Every day | ORAL | Status: DC

## 2015-06-29 NOTE — Telephone Encounter (Signed)
Spoke with pt, aware refill sent to the pharmacy. 

## 2015-06-29 NOTE — Telephone Encounter (Signed)
Pt says his Isosorbide was denied,he wants to know why. Is he still supposed to take it?

## 2015-06-30 ENCOUNTER — Ambulatory Visit (INDEPENDENT_AMBULATORY_CARE_PROVIDER_SITE_OTHER): Payer: Medicaid Other | Admitting: Pharmacist Clinician (PhC)/ Clinical Pharmacy Specialist

## 2015-06-30 DIAGNOSIS — I513 Intracardiac thrombosis, not elsewhere classified: Secondary | ICD-10-CM

## 2015-06-30 DIAGNOSIS — Z7901 Long term (current) use of anticoagulants: Secondary | ICD-10-CM | POA: Diagnosis not present

## 2015-06-30 DIAGNOSIS — I213 ST elevation (STEMI) myocardial infarction of unspecified site: Secondary | ICD-10-CM | POA: Diagnosis not present

## 2015-06-30 LAB — POCT INR: INR: 1.9

## 2015-07-11 ENCOUNTER — Other Ambulatory Visit: Payer: Self-pay | Admitting: Cardiovascular Disease

## 2015-08-02 ENCOUNTER — Telehealth: Payer: Self-pay | Admitting: Cardiovascular Disease

## 2015-08-02 NOTE — Telephone Encounter (Signed)
Received records from [facility] for appointment on 08/17/15 with Dr Sallyanne Kuster.  Records given to [facility] (medical records) for Dr Croitoru's schedule on 08/17/15. lp

## 2015-08-17 ENCOUNTER — Ambulatory Visit (INDEPENDENT_AMBULATORY_CARE_PROVIDER_SITE_OTHER): Payer: Medicaid Other | Admitting: Pharmacist Clinician (PhC)/ Clinical Pharmacy Specialist

## 2015-08-17 ENCOUNTER — Ambulatory Visit (INDEPENDENT_AMBULATORY_CARE_PROVIDER_SITE_OTHER): Payer: Medicaid Other | Admitting: Cardiovascular Disease

## 2015-08-17 ENCOUNTER — Encounter: Payer: Self-pay | Admitting: Cardiovascular Disease

## 2015-08-17 DIAGNOSIS — I472 Ventricular tachycardia, unspecified: Secondary | ICD-10-CM

## 2015-08-17 DIAGNOSIS — Z7901 Long term (current) use of anticoagulants: Secondary | ICD-10-CM

## 2015-08-17 DIAGNOSIS — I213 ST elevation (STEMI) myocardial infarction of unspecified site: Secondary | ICD-10-CM

## 2015-08-17 DIAGNOSIS — Z95810 Presence of automatic (implantable) cardiac defibrillator: Secondary | ICD-10-CM

## 2015-08-17 DIAGNOSIS — I5042 Chronic combined systolic (congestive) and diastolic (congestive) heart failure: Secondary | ICD-10-CM | POA: Insufficient documentation

## 2015-08-17 DIAGNOSIS — I255 Ischemic cardiomyopathy: Secondary | ICD-10-CM

## 2015-08-17 DIAGNOSIS — I2510 Atherosclerotic heart disease of native coronary artery without angina pectoris: Secondary | ICD-10-CM | POA: Diagnosis not present

## 2015-08-17 DIAGNOSIS — I513 Intracardiac thrombosis, not elsewhere classified: Secondary | ICD-10-CM

## 2015-08-17 LAB — POCT INR: INR: 1.7

## 2015-08-17 NOTE — Patient Instructions (Signed)
Remote monitoring is used to monitor your ICD from home. This monitoring reduces the number of office visits required to check your device to one time per year. It allows [place] to monitor the functioning of your device to ensure it is working properly. You are scheduled for a device check from home on Nov 15, 2015. You may send your transmission at any time that day. If you have a wireless device, the transmission will be sent automatically. After your physician reviews your transmission, you will receive a postcard with your next transmission date.  Dr. Sallyanne Kuster recommends that you schedule a follow-up appointment in: [place] ([facility])

## 2015-08-17 NOTE — Progress Notes (Signed)
Patient ID: Steven [name], male   DOB: 1956/01/25, 60 y.o.   MRN: [id number]    Cardiology Office Note    Date:  08/17/2015   ID:  Steven, [name] [birth year], MRN [id number]  PCP:  Lorelee Market, MD  Cardiologist:  Shelva Majestic, M.D.; Sanda Klein, MD   Chief Complaint  Patient presents with  . Follow-up    occassional chest pain, no shortness of breath, no edema, no pain or cramping in legs, occassiona lightheadedness or dizziness    History of Present Illness:  Steven [name] is a 60 y.o. male with severe ischemic cardiomyopathy following an extensive infarction in the LAD artery territory complicated by severe left ventricular systolic dysfunction, left ventricular apical thrombus and ventricular tachycardia. He received a defibrillator for primary prevention in 2012 [employer]). He has never received device therapy to date, but his device has recorded fairly lengthy episodes of nonsustained ventricular tachycardia around the time when he suffered syncope and a subdural hematoma that required surgical evacuation.  He feels well. His activity level remains at around 3 hours a day. His blood pressure continues to be relatively low but he denies dizziness or syncope or weakness. He has gained some weight and is now in the obese range. He denies exertional dyspnea. He has noticed very mild chest tightness if he exceeds his usual level of activity. This promptly resolves when he rests and does not appear to be changing over time.  His device shows roughly 72% atrial pacing and no ventricular pacing. Battery voltage is 3.06 V (ERI 2.63 V) systemic been no episodes of ventricular tachycardia or atrial fibrillation since his last device check.    Past Medical History  Diagnosis Date  . Acute MI, anterior wall ([place])   . Inguinal hernia, left   . CAD (coronary artery disease)     2D ECHO, 07/13/2011 - EF <25%, LV moderatelty dilated, LA moderately dilatedLEXISCAN, 12/14/2011 -  moderate-severe perfusion defect seen in the basal anteroseptal, mid anterior, apicacl anterior, apical, apical inferior, and apical lateral regions, post-stress EF 25%, new EKG changes from baseline abnormalities    Past Surgical History  Procedure Laterality Date  . Cardiac defibrillator placement  03/02/2011    Medtronic Protecta XT DR, model [id number], serial [id number]  . Cardiac catheterization  11/25/2010    Predilation balloon-Apex monorail 2x33mm, Cutting balloon-2.25x35mm, resulting in a reduction of 100% stenosis down to less than 10%  . Pacemaker placement  07/14/2010    Temporary placement of pacemaker, if rhythm issue continues will need a permanent device  . Cardiac catheterization  07/12/2010    LAD stented with a 3.5x53mm bare-metal Veriflex stent resulting in a reduction of 100% lesion to 0%    Outpatient Prescriptions Prior to Visit  Medication Sig Dispense Refill  . aspirin 81 MG tablet Take 81 mg by mouth daily.    [name] atorvastatin (LIPITOR) 40 MG tablet Take 1 tablet (40 mg total) by mouth daily. 90 tablet 2  . carvedilol (COREG) 25 MG tablet Take 1 tablet (25 mg total) by mouth 2 (two) times daily with a meal. 60 tablet 11  . clomiPHENE (CLOMID) 50 MG tablet Take 25 mg by mouth every other day.    . furosemide (LASIX) 40 MG tablet Take 1 tablet (40 mg total) by mouth daily. If you notice SOB then take 40 in the morning and 20 mg on the evening (1/2 tablet) 60 tablet 11  . isosorbide mononitrate (IMDUR) 30 MG 24 hr tablet  Take 1 tablet (30 mg total) by mouth daily. 30 tablet 9  . lisinopril (PRINIVIL,ZESTRIL) 5 MG tablet TAKE ONE TABLET BY MOUTH ONCE DAILY 30 tablet 11  . Multiple Vitamin (MULTIVITAMIN) tablet Take 1 tablet by mouth daily.    [name] NITROSTAT 0.4 MG SL tablet DISSOLVE ONE TABLET UNDER THE TONGUE EVERY 5 MINUTES AS NEEDED FOR CHEST PAIN.  DO NOT EXCEED A TOTAL OF 3 DOSES IN 15 MINUTES 25 tablet 1  . spironolactone (ALDACTONE) 25 MG tablet TAKE ONE-HALF TABLET BY  MOUTH TWICE DAILY 30 tablet 11  . warfarin (COUMADIN) 5 MG tablet TAKE ONE TABLET BY MOUTH ONCE DAILY OR AS DIRECTED BY COUMADIN CLINIC 30 tablet 5   No facility-administered medications prior to visit.     Allergies:   Review of patient's allergies indicates no known allergies.   Social History   Social History  . Marital Status: Married    Spouse Name: N/A  . Number of Children: N/A  . Years of Education: N/A   Social History Main Topics  . Smoking status: Former Smoker    Types: Cigarettes, Cigars  . Smokeless tobacco: Never Used  . Alcohol Use: 1.0 - 1.5 oz/week    2-3 drink(s) per week  . Drug Use: No  . Sexual Activity: Not Asked   Other Topics Concern  . None   Social History Narrative     ROS:   Please see the history of present illness.    ROS All other systems reviewed and are negative.   PHYSICAL EXAM:   VS:  BP 98/66 mmHg  Pulse 66  Ht 5\' 9"  (1.753 m)  Wt 93.554 kg (206 lb 4 oz)  BMI 30.44 kg/m2   GEN: Well nourished, well developed, in no acute distress HEENT: normal Neck: no JVD, carotid bruits, or masses Cardiac: RRR; no murmurs, rubs, or gallops,no edema , healthy left subclavian defibrillator site Respiratory:  clear to auscultation bilaterally, normal work of breathing GI: soft, nontender, nondistended, + BS MS: no deformity or atrophy Skin: warm and dry, no rash Neuro:  Alert and Oriented x 3, Strength and sensation are intact Psych: euthymic mood, full affect  Wt Readings from Last 3 Encounters:  08/17/15 93.554 kg (206 lb 4 oz)  03/22/15 91.672 kg (202 lb 1.6 oz)  08/31/14 90.629 kg (199 lb 12.8 oz)      Studies/Labs Reviewed:   EKG:  EKG is ordered today.  The ekg ordered today demonstrates atrial paced, ventricular sensed, Q waves from leads V1 to V5, QTC 416 ms  Recent Labs: 09/03/2014: ALT 17; BUN 15; Creat 1.44*; Hemoglobin 15.9; Platelets 165; Potassium 4.4; Sodium 140; TSH 1.500   Lipid Panel    Component Value Date/Time    CHOL 115 09/03/2014 0803   CHOL 161 07/01/2013 0838   TRIG 80 09/03/2014 0803   TRIG 108 07/01/2013 0838   HDL 35* 09/03/2014 0803   HDL 43 07/01/2013 0838   CHOLHDL 3.3 09/03/2014 0803   VLDL 16 09/03/2014 0803   LDLCALC 64 09/03/2014 0803   LDLCALC 96 07/01/2013 0838    Additional studies/ records that were reviewed today include:  Dr. Lucy Chris notes from September, Coumadin clinic visit notes    ASSESSMENT:    1. ICD  Medtronic protecta dual-chamber, implanted September 2012 primary prevention   2. Cardiomyopathy, ischemic   3. VT (ventricular tachycardia) ([place])   4. Atherosclerosis of native coronary artery of native heart without angina pectoris   5. Apical mural thrombus (  [place])   6. Chronic combined systolic and diastolic CHF (congestive heart failure) (HCC)      PLAN:  In order of problems listed above:  1. ICD: Normal device function, continue remote downloads every 3 months via [employer]. 2. CMP: Severely depressed left ventricular systolic function most recent EF less than 20% 3. VT: No ventricular arrhythmia since last device download. 4. CAD: Seems to have mild angina pectoris only when he perform exertion that exceeds usual activity. There is a little room to increase nitrates, but his blood pressure is quite low. Consider Ranexa. 5. LV thrombus: Not seen on last echo, remains on warfarin anticoagulation. Note history of posttraumatic subdural hematoma. 6. CHF: well compensated, NYHA functional class I-II. He is on full dose carvedilol and a relatively low dose of lisinopril due to his blood pressure    Medication Adjustments/Labs and Tests Ordered: Current medicines are reviewed at length with the patient today.  Concerns regarding medicines are outlined above.  Medication changes, Labs and Tests ordered today are listed in the Patient Instructions below. Patient Instructions  Remote monitoring is used to monitor your ICD from home. This monitoring reduces  the number of office visits required to check your device to one time per year. It allows [place] to monitor the functioning of your device to ensure it is working properly. You are scheduled for a device check from home on Nov 15, 2015. You may send your transmission at any time that day. If you have a wireless device, the transmission will be sent automatically. After your physician reviews your transmission, you will receive a postcard with your next transmission date.  Dr. Sallyanne Kuster recommends that you schedule a follow-up appointment in: [place] ([facility])         Mikael Spray, MD  08/17/2015 3:36 PM    [facility] [address], [place], [place]  [address] Phone: [phone]; Fax: [phone]

## 2015-08-30 ENCOUNTER — Ambulatory Visit (INDEPENDENT_AMBULATORY_CARE_PROVIDER_SITE_OTHER): Payer: Medicaid Other | Admitting: Pharmacist Clinician (PhC)/ Clinical Pharmacy Specialist

## 2015-08-30 DIAGNOSIS — Z7901 Long term (current) use of anticoagulants: Secondary | ICD-10-CM

## 2015-08-30 DIAGNOSIS — I513 Intracardiac thrombosis, not elsewhere classified: Secondary | ICD-10-CM

## 2015-08-30 DIAGNOSIS — I213 ST elevation (STEMI) myocardial infarction of unspecified site: Secondary | ICD-10-CM | POA: Diagnosis not present

## 2015-08-30 LAB — POCT INR: INR: 2.7

## 2015-09-13 ENCOUNTER — Other Ambulatory Visit: Payer: Self-pay | Admitting: Cardiovascular Disease

## 2015-09-13 NOTE — Telephone Encounter (Signed)
Rx has been sent to the pharmacy electronically. ° °

## 2015-09-14 ENCOUNTER — Other Ambulatory Visit: Payer: Self-pay | Admitting: Cardiovascular Disease

## 2015-09-14 NOTE — Telephone Encounter (Signed)
REFILL 

## 2015-09-15 ENCOUNTER — Telehealth: Payer: Self-pay | Admitting: Cardiovascular Disease

## 2015-09-15 MED ORDER — SPIRONOLACTONE 25 MG PO TABS: 12.5000 mg | ORAL_TABLET | Freq: Two times a day (BID) | ORAL | Status: DC

## 2015-09-15 NOTE — Telephone Encounter (Signed)
Advised yes, refill sent. Pt will pick up today at [facility] on [address].

## 2015-09-15 NOTE — Telephone Encounter (Signed)
[name] is calling because he wanting to know if he is supposed still be on Spironolactone . Please call   Thanks

## 2015-09-27 ENCOUNTER — Ambulatory Visit (INDEPENDENT_AMBULATORY_CARE_PROVIDER_SITE_OTHER): Payer: Medicaid Other | Admitting: Pharmacist Clinician (PhC)/ Clinical Pharmacy Specialist

## 2015-09-27 DIAGNOSIS — Z7901 Long term (current) use of anticoagulants: Secondary | ICD-10-CM

## 2015-09-27 DIAGNOSIS — I513 Intracardiac thrombosis, not elsewhere classified: Secondary | ICD-10-CM

## 2015-09-27 DIAGNOSIS — I213 ST elevation (STEMI) myocardial infarction of unspecified site: Secondary | ICD-10-CM

## 2015-09-27 LAB — POCT INR: INR: 3.2

## 2015-09-29 ENCOUNTER — Other Ambulatory Visit: Payer: Self-pay | Admitting: *Deleted

## 2015-09-29 ENCOUNTER — Other Ambulatory Visit: Payer: Self-pay | Admitting: Cardiovascular Disease

## 2015-09-29 MED ORDER — LISINOPRIL 5 MG PO TABS: 5.0000 mg | ORAL_TABLET | Freq: Every day | ORAL | Status: DC

## 2015-09-29 MED ORDER — CARVEDILOL 25 MG PO TABS: 25.0000 mg | ORAL_TABLET | Freq: Two times a day (BID) | ORAL | Status: DC

## 2015-10-18 ENCOUNTER — Ambulatory Visit (INDEPENDENT_AMBULATORY_CARE_PROVIDER_SITE_OTHER): Payer: Medicaid Other | Admitting: Pharmacist

## 2015-10-18 DIAGNOSIS — I513 Intracardiac thrombosis, not elsewhere classified: Secondary | ICD-10-CM

## 2015-10-18 DIAGNOSIS — Z7901 Long term (current) use of anticoagulants: Secondary | ICD-10-CM

## 2015-10-18 DIAGNOSIS — I213 ST elevation (STEMI) myocardial infarction of unspecified site: Secondary | ICD-10-CM | POA: Diagnosis not present

## 2015-10-18 LAB — POCT INR: INR: 2.4

## 2015-10-26 ENCOUNTER — Other Ambulatory Visit: Payer: Self-pay | Admitting: Cardiovascular Disease

## 2015-10-26 NOTE — Telephone Encounter (Signed)
REFILL 

## 2015-11-15 ENCOUNTER — Ambulatory Visit (INDEPENDENT_AMBULATORY_CARE_PROVIDER_SITE_OTHER): Payer: Medicaid Other | Admitting: Pharmacist

## 2015-11-15 DIAGNOSIS — I513 Intracardiac thrombosis, not elsewhere classified: Secondary | ICD-10-CM

## 2015-11-15 DIAGNOSIS — I213 ST elevation (STEMI) myocardial infarction of unspecified site: Secondary | ICD-10-CM | POA: Diagnosis not present

## 2015-11-15 DIAGNOSIS — Z7901 Long term (current) use of anticoagulants: Secondary | ICD-10-CM

## 2015-11-15 LAB — POCT INR: INR: 2.2

## 2015-11-22 LAB — CUP PACEART INCLINIC DEVICE CHECK
Battery Voltage: 3.06 V
Brady Statistic RA Percent Paced: 71.84 %
Brady Statistic RV Percent Paced: 0.04 %
HIGH POWER IMPEDANCE MEASURED VALUE: 399 Ohm
HighPow Impedance: 46 Ohm
HighPow Impedance: 64 Ohm
Implantable Lead Implant Date: 20120906
Implantable Lead Location: 753859
Implantable Lead Model: 185
Implantable Lead Model: 5076
Lead Channel Impedance Value: 380 Ohm
Lead Channel Pacing Threshold Pulse Width: 0.4 ms
Lead Channel Sensing Intrinsic Amplitude: 2.125 mV
Lead Channel Sensing Intrinsic Amplitude: 2.125 mV
Lead Channel Sensing Intrinsic Amplitude: 8.125 mV
Lead Channel Setting Pacing Pulse Width: 0.4 ms
Lead Channel Setting Sensing Sensitivity: 0.3 mV
MDC IDC LEAD IMPLANT DT: 20120906
MDC IDC LEAD LOCATION: 753860
MDC IDC LEAD SERIAL: 358872
MDC IDC MSMT LEADCHNL RA IMPEDANCE VALUE: 380 Ohm
MDC IDC MSMT LEADCHNL RA PACING THRESHOLD AMPLITUDE: 0.375 V
MDC IDC MSMT LEADCHNL RV PACING THRESHOLD AMPLITUDE: 0.625 V
MDC IDC MSMT LEADCHNL RV PACING THRESHOLD PULSEWIDTH: 0.4 ms
MDC IDC MSMT LEADCHNL RV SENSING INTR AMPL: 8.125 mV
MDC IDC SESS DTM: 20170221165739
MDC IDC SET LEADCHNL RA PACING AMPLITUDE: 1.5 V
MDC IDC SET LEADCHNL RV PACING AMPLITUDE: 2 V
MDC IDC STAT BRADY AP VP PERCENT: 0.03 %
MDC IDC STAT BRADY AP VS PERCENT: 71.82 %
MDC IDC STAT BRADY AS VP PERCENT: 0.01 %
MDC IDC STAT BRADY AS VS PERCENT: 28.15 %

## 2015-11-25 ENCOUNTER — Telehealth: Payer: Self-pay | Admitting: Cardiology

## 2015-11-25 ENCOUNTER — Encounter: Payer: Medicaid Other | Admitting: *Deleted

## 2015-11-25 NOTE — Telephone Encounter (Signed)
LMOVM reminding pt to send remote transmission.   

## 2015-11-26 ENCOUNTER — Encounter: Payer: Self-pay | Admitting: Cardiology

## 2015-12-06 ENCOUNTER — Ambulatory Visit (INDEPENDENT_AMBULATORY_CARE_PROVIDER_SITE_OTHER): Payer: Medicaid Other | Admitting: *Deleted

## 2015-12-06 DIAGNOSIS — I472 Ventricular tachycardia, unspecified: Secondary | ICD-10-CM

## 2015-12-06 DIAGNOSIS — I5042 Chronic combined systolic (congestive) and diastolic (congestive) heart failure: Secondary | ICD-10-CM

## 2015-12-06 DIAGNOSIS — I255 Ischemic cardiomyopathy: Secondary | ICD-10-CM

## 2015-12-06 NOTE — Progress Notes (Signed)
Remote ICD transmission.   

## 2015-12-09 LAB — CUP PACEART REMOTE DEVICE CHECK
Battery Voltage: 3.04 V
Brady Statistic AS VP Percent: 0.01 %
Brady Statistic AS VS Percent: 21.55 %
HIGH POWER IMPEDANCE MEASURED VALUE: 70 Ohm
HighPow Impedance: 437 Ohm
HighPow Impedance: 51 Ohm
Implantable Lead Implant Date: 20120906
Implantable Lead Location: 753860
Implantable Lead Model: 5076
Lead Channel Impedance Value: 437 Ohm
Lead Channel Pacing Threshold Amplitude: 0.5 V
Lead Channel Pacing Threshold Amplitude: 0.625 V
Lead Channel Pacing Threshold Pulse Width: 0.4 ms
Lead Channel Sensing Intrinsic Amplitude: 2.125 mV
MDC IDC LEAD IMPLANT DT: 20120906
MDC IDC LEAD LOCATION: 753859
MDC IDC LEAD MODEL: 185
MDC IDC LEAD SERIAL: 358872
MDC IDC MSMT LEADCHNL RA SENSING INTR AMPL: 2.125 mV
MDC IDC MSMT LEADCHNL RV IMPEDANCE VALUE: 437 Ohm
MDC IDC MSMT LEADCHNL RV PACING THRESHOLD PULSEWIDTH: 0.4 ms
MDC IDC MSMT LEADCHNL RV SENSING INTR AMPL: 10.875 mV
MDC IDC MSMT LEADCHNL RV SENSING INTR AMPL: 10.875 mV
MDC IDC SESS DTM: 20170610073200
MDC IDC SET LEADCHNL RA PACING AMPLITUDE: 1.5 V
MDC IDC SET LEADCHNL RV PACING AMPLITUDE: 2 V
MDC IDC SET LEADCHNL RV PACING PULSEWIDTH: 0.4 ms
MDC IDC SET LEADCHNL RV SENSING SENSITIVITY: 0.3 mV
MDC IDC STAT BRADY AP VP PERCENT: 0.02 %
MDC IDC STAT BRADY AP VS PERCENT: 78.41 %
MDC IDC STAT BRADY RA PERCENT PACED: 78.44 %
MDC IDC STAT BRADY RV PERCENT PACED: 0.03 %

## 2015-12-13 ENCOUNTER — Ambulatory Visit (INDEPENDENT_AMBULATORY_CARE_PROVIDER_SITE_OTHER): Payer: Medicaid Other | Admitting: Pharmacist

## 2015-12-13 DIAGNOSIS — I213 ST elevation (STEMI) myocardial infarction of unspecified site: Secondary | ICD-10-CM

## 2015-12-13 DIAGNOSIS — I513 Intracardiac thrombosis, not elsewhere classified: Secondary | ICD-10-CM

## 2015-12-13 DIAGNOSIS — Z7901 Long term (current) use of anticoagulants: Secondary | ICD-10-CM

## 2015-12-13 LAB — POCT INR: INR: 3.9

## 2015-12-16 ENCOUNTER — Encounter: Payer: Self-pay | Admitting: Cardiology

## 2016-01-03 ENCOUNTER — Ambulatory Visit (INDEPENDENT_AMBULATORY_CARE_PROVIDER_SITE_OTHER): Payer: Medicaid Other | Admitting: Pharmacist Clinician (PhC)/ Clinical Pharmacy Specialist

## 2016-01-03 DIAGNOSIS — I213 ST elevation (STEMI) myocardial infarction of unspecified site: Secondary | ICD-10-CM

## 2016-01-03 DIAGNOSIS — Z7901 Long term (current) use of anticoagulants: Secondary | ICD-10-CM

## 2016-01-03 DIAGNOSIS — I513 Intracardiac thrombosis, not elsewhere classified: Secondary | ICD-10-CM

## 2016-01-03 LAB — POCT INR: INR: 2.7

## 2016-02-01 ENCOUNTER — Encounter (INDEPENDENT_AMBULATORY_CARE_PROVIDER_SITE_OTHER): Payer: Self-pay

## 2016-02-01 ENCOUNTER — Ambulatory Visit (INDEPENDENT_AMBULATORY_CARE_PROVIDER_SITE_OTHER): Payer: Medicaid Other | Admitting: Pharmacist Clinician (PhC)/ Clinical Pharmacy Specialist

## 2016-02-01 DIAGNOSIS — Z7901 Long term (current) use of anticoagulants: Secondary | ICD-10-CM | POA: Diagnosis not present

## 2016-02-01 DIAGNOSIS — I213 ST elevation (STEMI) myocardial infarction of unspecified site: Secondary | ICD-10-CM

## 2016-02-01 DIAGNOSIS — I513 Intracardiac thrombosis, not elsewhere classified: Secondary | ICD-10-CM

## 2016-02-01 LAB — POCT INR: INR: 3.2

## 2016-02-24 ENCOUNTER — Ambulatory Visit (INDEPENDENT_AMBULATORY_CARE_PROVIDER_SITE_OTHER): Payer: Medicaid Other | Admitting: Pharmacist

## 2016-02-24 DIAGNOSIS — Z7901 Long term (current) use of anticoagulants: Secondary | ICD-10-CM

## 2016-02-24 DIAGNOSIS — I513 Intracardiac thrombosis, not elsewhere classified: Secondary | ICD-10-CM

## 2016-02-24 DIAGNOSIS — I213 ST elevation (STEMI) myocardial infarction of unspecified site: Secondary | ICD-10-CM

## 2016-02-24 LAB — POCT INR: INR: 1.8

## 2016-03-06 ENCOUNTER — Other Ambulatory Visit: Payer: Self-pay

## 2016-03-06 ENCOUNTER — Ambulatory Visit (INDEPENDENT_AMBULATORY_CARE_PROVIDER_SITE_OTHER): Payer: Medicaid Other | Admitting: *Deleted

## 2016-03-06 ENCOUNTER — Telehealth: Payer: Self-pay | Admitting: Cardiology

## 2016-03-06 DIAGNOSIS — I255 Ischemic cardiomyopathy: Secondary | ICD-10-CM

## 2016-03-06 MED ORDER — WARFARIN SODIUM 5 MG PO TABS: ORAL_TABLET | ORAL | 5 refills | Status: DC

## 2016-03-06 NOTE — Telephone Encounter (Signed)
Spoke to patient and informed that refills have been sent. He will call with any other issues.

## 2016-03-06 NOTE — Telephone Encounter (Signed)
Spoke with pt and reminded pt of remote transmission that is due today. Pt verbalized understanding.   

## 2016-03-06 NOTE — Progress Notes (Signed)
Remote ICD transmission.   

## 2016-03-07 ENCOUNTER — Other Ambulatory Visit: Payer: Self-pay | Admitting: Cardiovascular Disease

## 2016-03-07 NOTE — Telephone Encounter (Signed)
Spoke to [place] at [facility] and she confirmed that they did receive RX yesterday.  The RX is ready at the pharmacy for pt. Will cancel current order.

## 2016-03-07 NOTE — Telephone Encounter (Signed)
Spoke to patient as this request was sent yesterday. He is unsure if they have received refills.  Will call pharmacy to verify they received rx from yesterday. They open at [address].

## 2016-03-08 LAB — CUP PACEART REMOTE DEVICE CHECK
Brady Statistic AS VP Percent: 0.01 %
Brady Statistic RA Percent Paced: 78.01 %
Date Time Interrogation Session: 20170911161955
HIGH POWER IMPEDANCE MEASURED VALUE: 68 Ohm
HighPow Impedance: 380 Ohm
HighPow Impedance: 49 Ohm
Implantable Lead Location: 753860
Implantable Lead Model: 185
Lead Channel Impedance Value: 380 Ohm
Lead Channel Impedance Value: 399 Ohm
Lead Channel Pacing Threshold Amplitude: 0.625 V
Lead Channel Sensing Intrinsic Amplitude: 2.125 mV
Lead Channel Setting Pacing Amplitude: 2 V
Lead Channel Setting Pacing Pulse Width: 0.4 ms
MDC IDC LEAD IMPLANT DT: 20120906
MDC IDC LEAD IMPLANT DT: 20120906
MDC IDC LEAD LOCATION: 753859
MDC IDC LEAD SERIAL: 358872
MDC IDC MSMT BATTERY VOLTAGE: 2.98 V
MDC IDC MSMT LEADCHNL RA PACING THRESHOLD AMPLITUDE: 0.5 V
MDC IDC MSMT LEADCHNL RA PACING THRESHOLD PULSEWIDTH: 0.4 ms
MDC IDC MSMT LEADCHNL RA SENSING INTR AMPL: 2.125 mV
MDC IDC MSMT LEADCHNL RV PACING THRESHOLD PULSEWIDTH: 0.4 ms
MDC IDC MSMT LEADCHNL RV SENSING INTR AMPL: 7.625 mV
MDC IDC MSMT LEADCHNL RV SENSING INTR AMPL: 7.625 mV
MDC IDC SET LEADCHNL RA PACING AMPLITUDE: 1.5 V
MDC IDC SET LEADCHNL RV SENSING SENSITIVITY: 0.3 mV
MDC IDC STAT BRADY AP VP PERCENT: 0.02 %
MDC IDC STAT BRADY AP VS PERCENT: 77.99 %
MDC IDC STAT BRADY AS VS PERCENT: 21.98 %
MDC IDC STAT BRADY RV PERCENT PACED: 0.03 %

## 2016-03-09 ENCOUNTER — Encounter: Payer: Self-pay | Admitting: Cardiology

## 2016-03-16 ENCOUNTER — Ambulatory Visit (INDEPENDENT_AMBULATORY_CARE_PROVIDER_SITE_OTHER): Payer: Medicaid Other | Admitting: Pharmacist

## 2016-03-16 DIAGNOSIS — Z7901 Long term (current) use of anticoagulants: Secondary | ICD-10-CM | POA: Diagnosis not present

## 2016-03-16 DIAGNOSIS — I213 ST elevation (STEMI) myocardial infarction of unspecified site: Secondary | ICD-10-CM

## 2016-03-16 DIAGNOSIS — I513 Intracardiac thrombosis, not elsewhere classified: Secondary | ICD-10-CM

## 2016-03-16 LAB — POCT INR: INR: 1.9

## 2016-03-30 ENCOUNTER — Ambulatory Visit (INDEPENDENT_AMBULATORY_CARE_PROVIDER_SITE_OTHER): Payer: Medicaid Other | Admitting: Pharmacist

## 2016-03-30 DIAGNOSIS — Z7901 Long term (current) use of anticoagulants: Secondary | ICD-10-CM

## 2016-03-30 DIAGNOSIS — I513 Intracardiac thrombosis, not elsewhere classified: Secondary | ICD-10-CM | POA: Diagnosis not present

## 2016-03-30 LAB — POCT INR: INR: 2.8

## 2016-04-21 ENCOUNTER — Ambulatory Visit (INDEPENDENT_AMBULATORY_CARE_PROVIDER_SITE_OTHER): Payer: Medicaid Other | Admitting: Pharmacist Clinician (PhC)/ Clinical Pharmacy Specialist

## 2016-04-21 DIAGNOSIS — Z7901 Long term (current) use of anticoagulants: Secondary | ICD-10-CM | POA: Diagnosis not present

## 2016-04-21 DIAGNOSIS — I513 Intracardiac thrombosis, not elsewhere classified: Secondary | ICD-10-CM

## 2016-04-21 LAB — POCT INR: INR: 4

## 2016-04-26 ENCOUNTER — Other Ambulatory Visit: Payer: Self-pay | Admitting: Cardiovascular Disease

## 2016-04-26 NOTE — Telephone Encounter (Signed)
Rx(s) sent to pharmacy electronically.  

## 2016-05-05 ENCOUNTER — Ambulatory Visit (INDEPENDENT_AMBULATORY_CARE_PROVIDER_SITE_OTHER): Payer: Medicaid Other | Admitting: Pharmacist

## 2016-05-05 DIAGNOSIS — Z7901 Long term (current) use of anticoagulants: Secondary | ICD-10-CM

## 2016-05-05 DIAGNOSIS — I513 Intracardiac thrombosis, not elsewhere classified: Secondary | ICD-10-CM | POA: Diagnosis not present

## 2016-05-05 LAB — POCT INR: INR: 3.4

## 2016-05-16 ENCOUNTER — Other Ambulatory Visit: Payer: Self-pay | Admitting: *Deleted

## 2016-05-16 MED ORDER — ATORVASTATIN CALCIUM 40 MG PO TABS: 40.0000 mg | ORAL_TABLET | Freq: Every day | ORAL | 2 refills | Status: DC

## 2016-05-24 ENCOUNTER — Ambulatory Visit (INDEPENDENT_AMBULATORY_CARE_PROVIDER_SITE_OTHER): Payer: Medicaid Other | Admitting: Pharmacist

## 2016-05-24 DIAGNOSIS — I513 Intracardiac thrombosis, not elsewhere classified: Secondary | ICD-10-CM

## 2016-05-24 DIAGNOSIS — Z7901 Long term (current) use of anticoagulants: Secondary | ICD-10-CM

## 2016-05-24 LAB — POCT INR: INR: 2.1

## 2016-06-05 ENCOUNTER — Ambulatory Visit (INDEPENDENT_AMBULATORY_CARE_PROVIDER_SITE_OTHER): Payer: Medicaid Other | Admitting: *Deleted

## 2016-06-05 DIAGNOSIS — I255 Ischemic cardiomyopathy: Secondary | ICD-10-CM | POA: Diagnosis not present

## 2016-06-05 NOTE — Progress Notes (Signed)
Remote ICD transmission.   

## 2016-06-09 ENCOUNTER — Encounter: Payer: Self-pay | Admitting: Cardiology

## 2016-06-09 LAB — CUP PACEART REMOTE DEVICE CHECK
Brady Statistic AP VP Percent: 0.03 %
Brady Statistic AP VS Percent: 63.98 %
Brady Statistic AS VP Percent: 0.01 %
Brady Statistic RV Percent Paced: 0.04 %
HIGH POWER IMPEDANCE MEASURED VALUE: 399 Ohm
HIGH POWER IMPEDANCE MEASURED VALUE: 49 Ohm
HIGH POWER IMPEDANCE MEASURED VALUE: 64 Ohm
Implantable Lead Implant Date: 20120906
Implantable Lead Model: 185
Implantable Lead Model: 5076
Implantable Lead Serial Number: 358872
Lead Channel Pacing Threshold Amplitude: 0.625 V
Lead Channel Pacing Threshold Pulse Width: 0.4 ms
Lead Channel Sensing Intrinsic Amplitude: 6.875 mV
Lead Channel Sensing Intrinsic Amplitude: 6.875 mV
Lead Channel Setting Pacing Amplitude: 1.5 V
Lead Channel Setting Pacing Amplitude: 2 V
Lead Channel Setting Pacing Pulse Width: 0.4 ms
Lead Channel Setting Sensing Sensitivity: 0.3 mV
MDC IDC LEAD IMPLANT DT: 20120906
MDC IDC LEAD LOCATION: 753859
MDC IDC LEAD LOCATION: 753860
MDC IDC MSMT BATTERY VOLTAGE: 3 V
MDC IDC MSMT LEADCHNL RA IMPEDANCE VALUE: 437 Ohm
MDC IDC MSMT LEADCHNL RA PACING THRESHOLD AMPLITUDE: 0.5 V
MDC IDC MSMT LEADCHNL RA PACING THRESHOLD PULSEWIDTH: 0.4 ms
MDC IDC MSMT LEADCHNL RA SENSING INTR AMPL: 2.25 mV
MDC IDC MSMT LEADCHNL RA SENSING INTR AMPL: 2.25 mV
MDC IDC MSMT LEADCHNL RV IMPEDANCE VALUE: 437 Ohm
MDC IDC PG IMPLANT DT: 20120906
MDC IDC SESS DTM: 20171211072607
MDC IDC STAT BRADY AS VS PERCENT: 35.98 %
MDC IDC STAT BRADY RA PERCENT PACED: 63.71 %

## 2016-06-14 ENCOUNTER — Ambulatory Visit (INDEPENDENT_AMBULATORY_CARE_PROVIDER_SITE_OTHER): Payer: Medicaid Other | Admitting: Pharmacist

## 2016-06-14 DIAGNOSIS — Z7901 Long term (current) use of anticoagulants: Secondary | ICD-10-CM | POA: Diagnosis not present

## 2016-06-14 DIAGNOSIS — I513 Intracardiac thrombosis, not elsewhere classified: Secondary | ICD-10-CM

## 2016-06-14 LAB — POCT INR: INR: 1.8

## 2016-06-26 HISTORY — PX: HERNIA REPAIR: SHX51

## 2016-07-03 ENCOUNTER — Other Ambulatory Visit: Payer: Self-pay | Admitting: Cardiovascular Disease

## 2016-07-03 NOTE — Telephone Encounter (Signed)
Rx request sent to pharmacy.  

## 2016-07-05 ENCOUNTER — Ambulatory Visit (INDEPENDENT_AMBULATORY_CARE_PROVIDER_SITE_OTHER): Payer: Medicaid Other | Admitting: Pharmacist Clinician (PhC)/ Clinical Pharmacy Specialist

## 2016-07-05 DIAGNOSIS — Z7901 Long term (current) use of anticoagulants: Secondary | ICD-10-CM | POA: Diagnosis not present

## 2016-07-05 DIAGNOSIS — I513 Intracardiac thrombosis, not elsewhere classified: Secondary | ICD-10-CM | POA: Diagnosis not present

## 2016-07-05 LAB — POCT INR: INR: 1.9

## 2016-07-19 ENCOUNTER — Ambulatory Visit (INDEPENDENT_AMBULATORY_CARE_PROVIDER_SITE_OTHER): Payer: Medicaid Other | Admitting: Pharmacist

## 2016-07-19 DIAGNOSIS — Z7901 Long term (current) use of anticoagulants: Secondary | ICD-10-CM

## 2016-07-19 DIAGNOSIS — I513 Intracardiac thrombosis, not elsewhere classified: Secondary | ICD-10-CM

## 2016-07-19 LAB — POCT INR: INR: 2.3

## 2016-08-09 ENCOUNTER — Ambulatory Visit (INDEPENDENT_AMBULATORY_CARE_PROVIDER_SITE_OTHER): Payer: Medicaid Other | Admitting: Cardiovascular Disease

## 2016-08-09 ENCOUNTER — Encounter: Payer: Self-pay | Admitting: Cardiovascular Disease

## 2016-08-09 ENCOUNTER — Ambulatory Visit (INDEPENDENT_AMBULATORY_CARE_PROVIDER_SITE_OTHER): Payer: Medicaid Other | Admitting: Pharmacist

## 2016-08-09 VITALS — BP 96/70 | HR 65 | Ht 69.0 in | Wt 200.0 lb

## 2016-08-09 DIAGNOSIS — I25118 Atherosclerotic heart disease of native coronary artery with other forms of angina pectoris: Secondary | ICD-10-CM | POA: Diagnosis not present

## 2016-08-09 DIAGNOSIS — I255 Ischemic cardiomyopathy: Secondary | ICD-10-CM

## 2016-08-09 DIAGNOSIS — I5042 Chronic combined systolic (congestive) and diastolic (congestive) heart failure: Secondary | ICD-10-CM

## 2016-08-09 DIAGNOSIS — I236 Thrombosis of atrium, auricular appendage, and ventricle as current complications following acute myocardial infarction: Secondary | ICD-10-CM

## 2016-08-09 DIAGNOSIS — Z95810 Presence of automatic (implantable) cardiac defibrillator: Secondary | ICD-10-CM

## 2016-08-09 DIAGNOSIS — Z7901 Long term (current) use of anticoagulants: Secondary | ICD-10-CM

## 2016-08-09 DIAGNOSIS — I513 Intracardiac thrombosis, not elsewhere classified: Secondary | ICD-10-CM

## 2016-08-09 DIAGNOSIS — I472 Ventricular tachycardia, unspecified: Secondary | ICD-10-CM

## 2016-08-09 DIAGNOSIS — I2129 ST elevation (STEMI) myocardial infarction involving other sites: Secondary | ICD-10-CM

## 2016-08-09 LAB — CUP PACEART INCLINIC DEVICE CHECK
Battery Voltage: 2.99 V
Brady Statistic RA Percent Paced: 72.17 %
Date Time Interrogation Session: 20180214141327
HIGH POWER IMPEDANCE MEASURED VALUE: 380 Ohm
HighPow Impedance: 43 Ohm
HighPow Impedance: 61 Ohm
Implantable Lead Implant Date: 20120906
Implantable Lead Location: 753859
Implantable Lead Serial Number: 358872
Implantable Pulse Generator Implant Date: 20120906
Lead Channel Impedance Value: 380 Ohm
Lead Channel Pacing Threshold Pulse Width: 0.4 ms
Lead Channel Sensing Intrinsic Amplitude: 1.75 mV
MDC IDC LEAD IMPLANT DT: 20120906
MDC IDC LEAD LOCATION: 753860
MDC IDC MSMT LEADCHNL RA IMPEDANCE VALUE: 380 Ohm
MDC IDC MSMT LEADCHNL RA PACING THRESHOLD AMPLITUDE: 0.5 V
MDC IDC MSMT LEADCHNL RA SENSING INTR AMPL: 2.25 mV
MDC IDC MSMT LEADCHNL RV PACING THRESHOLD AMPLITUDE: 0.625 V
MDC IDC MSMT LEADCHNL RV PACING THRESHOLD PULSEWIDTH: 0.4 ms
MDC IDC MSMT LEADCHNL RV SENSING INTR AMPL: 5.625 mV
MDC IDC MSMT LEADCHNL RV SENSING INTR AMPL: 8 mV
MDC IDC SET LEADCHNL RA PACING AMPLITUDE: 1.5 V
MDC IDC SET LEADCHNL RV PACING AMPLITUDE: 2 V
MDC IDC SET LEADCHNL RV PACING PULSEWIDTH: 0.4 ms
MDC IDC SET LEADCHNL RV SENSING SENSITIVITY: 0.3 mV
MDC IDC STAT BRADY AP VP PERCENT: 0.03 %
MDC IDC STAT BRADY AP VS PERCENT: 72.47 %
MDC IDC STAT BRADY AS VP PERCENT: 0.01 %
MDC IDC STAT BRADY AS VS PERCENT: 27.49 %
MDC IDC STAT BRADY RV PERCENT PACED: 0.03 %

## 2016-08-09 LAB — POCT INR: INR: 3.7

## 2016-08-09 MED ORDER — SPIRONOLACTONE 25 MG PO TABS: 12.5000 mg | ORAL_TABLET | Freq: Every day | ORAL | 11 refills | Status: DC

## 2016-08-09 NOTE — Progress Notes (Signed)
Patient ID: Steven [name], male   DOB: 1956-04-02, 61 y.o.   MRN: [id number]    Cardiology Office Note    Date:  08/09/2016   ID:  Zildjian, Gandia 07-Mar-1956, MRN [id number]  PCP:  Lorelee Market, MD  Cardiologist:  Shelva Majestic, M.D.; Sanda Klein, MD   Chief Complaint  Patient presents with  . Follow-up    History of Present Illness:  Steven [name] is a 61 y.o. male with severe ischemic cardiomyopathy following an extensive infarction in the LAD artery territory complicated by severe left ventricular systolic dysfunction, left ventricular apical thrombus and ventricular tachycardia. He received a defibrillator for primary prevention in 2012 [employer]). He has never received device therapy to date, but his device has recorded fairly lengthy episodes of nonsustained ventricular tachycardia around the time when he suffered syncope and a subdural hematoma that required surgical evacuation.  He is doing well except he has occasional problems with dizziness and weakness. He also has infrequent angina, but only when he tries to do "too much too quickly". Chest tightness resolves promptly with rest His low blood pressure has always limited the intensity of antianginal therapy. 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 He has lost some weight since I last saw him, and is now in the overweight range, no longer obese. He denies exertional dyspnea  He had an attempted sleep study but has to go back for more testing. Is not clear to me whether he is going back for CPAP titration, or simply because he did not sleep enough during the initial test.  Some scale shows relatively steady weight of 197-198 pounds. Our office scale overestimates this by 2-3 pounds.  He hasn't seen Dr. Claiborne Billings in over a year.  His device shows roughly 72% atrial pacing and no ventricular pacing. Battery voltage is 2.99 V (ERI 2.63 V). There is one 8 beat episode of  nonsustained VT since his last device check. There have been no episodes of sustained ventricular tachycardia or atrial fibrillation since his last device check. His heart rate is remarkably constant, his thoracic impedance is always in the normal range.    Past Medical History:  Diagnosis Date  . Acute MI, anterior wall ([place])   . CAD (coronary artery disease)    2D ECHO, 07/13/2011 - EF <25%, LV moderatelty dilated, LA moderately dilatedLEXISCAN, 12/14/2011 - moderate-severe perfusion defect seen in the basal anteroseptal, mid anterior, apicacl anterior, apical, apical inferior, and apical lateral regions, post-stress EF 25%, new EKG changes from baseline abnormalities  . Inguinal hernia, left     Past Surgical History:  Procedure Laterality Date  . CARDIAC CATHETERIZATION  11/25/2010   Predilation balloon-Apex monorail 2x18mm, Cutting balloon-2.25x79mm, resulting in a reduction of 100% stenosis down to less than 10%  . CARDIAC CATHETERIZATION  07/12/2010   LAD stented with a 3.5x57mm bare-metal Veriflex stent resulting in a reduction of 100% lesion to 0%  . CARDIAC DEFIBRILLATOR PLACEMENT  03/02/2011   Medtronic Protecta XT DR, model [id number], serial [id number]  . PACEMAKER PLACEMENT  07/14/2010   Temporary placement of pacemaker, if rhythm issue continues will need a permanent device    Outpatient Medications Prior to Visit  Medication Sig Dispense Refill  . albuterol (PROVENTIL HFA;VENTOLIN HFA) 108 (90 Base) MCG/ACT inhaler Inhale 1-2 puffs into the lungs every 6 (six) hours as needed for wheezing or shortness of breath.    [name] aspirin 81 MG tablet  Take 81 mg by mouth daily.    [name] atorvastatin (LIPITOR) 40 MG tablet Take 1 tablet (40 mg total) by mouth daily. 90 tablet 2  . beclomethasone (QVAR) 80 MCG/ACT inhaler Inhale 1 puff into the lungs 2 (two) times daily.    . carvedilol (COREG) 25 MG tablet Take 1 tablet (25 mg total) by mouth 2 (two) times daily with a meal. 60 tablet 11  .  clomiPHENE (CLOMID) 50 MG tablet Take 25 mg by mouth every other day.    . furosemide (LASIX) 40 MG tablet TAKE ONE TABLET BY MOUTH TWICE DAILY 60 tablet 11  . isosorbide mononitrate (IMDUR) 30 MG 24 hr tablet TAKE ONE TABLET BY MOUTH ONCE DAILY 30 tablet 9  . lisinopril (PRINIVIL,ZESTRIL) 5 MG tablet Take 1 tablet (5 mg total) by mouth daily. 30 tablet 11  . Multiple Vitamin (MULTIVITAMIN) tablet Take 1 tablet by mouth daily.    [name] NITROSTAT 0.4 MG SL tablet DISSOLVE ONE TABLET UNDER THE TONGUE EVERY 5 MINUTES AS NEEDED FOR CHEST PAIN.  DO NOT EXCEED A TOTAL OF 3 DOSES IN 15 MINUTES 25 tablet 1  . tiotropium (SPIRIVA) 18 MCG inhalation capsule Place 18 mcg into inhaler and inhale daily.    [name] warfarin (COUMADIN) 5 MG tablet TAKE ONE TABLET BY MOUTH ONCE DAILY OR AS DIRECTED BY COUMADIN CLINIC 30 tablet 5  . spironolactone (ALDACTONE) 25 MG tablet Take 0.5 tablets (12.5 mg total) by mouth 2 (two) times daily. 30 tablet 1   No facility-administered medications prior to visit.      Allergies:   Patient has no known allergies.   Social History   Social History  . Marital status: Married    Spouse name: N/A  . Number of children: N/A  . Years of education: N/A   Social History Main Topics  . Smoking status: Former Smoker    Types: Cigarettes, Cigars  . Smokeless tobacco: Never Used  . Alcohol use 1.0 - 1.5 oz/week    2 - 3 drink(s) per week  . Drug use: No  . Sexual activity: Not Asked   Other Topics Concern  . None   Social History Narrative  . None     ROS:   Please see the history of present illness.    ROS All other systems reviewed and are negative.   PHYSICAL EXAM:   VS:  BP 96/70 (BP Location: Right Arm, Patient Position: Sitting, Cuff Size: Normal)   Pulse 65   Ht 5\' 9"  (1.753 m)   Wt 90.7 kg (200 lb)   BMI 29.53 kg/m    GEN: Well nourished, well developed, in no acute distress  HEENT: normal  Neck: no JVD, carotid bruits, or masses Cardiac: RRR; no murmurs,  rubs, or gallops,no edema , healthy left subclavian defibrillator site Respiratory:  clear to auscultation bilaterally, normal work of breathing GI: soft, nontender, nondistended, + BS MS: no deformity or atrophy  Skin: warm and dry, no rash Neuro:  Alert and Oriented x 3, Strength and sensation are intact Psych: euthymic mood, full affect  Wt Readings from Last 3 Encounters:  08/09/16 90.7 kg (200 lb)  08/17/15 93.6 kg (206 lb 4 oz)  03/22/15 91.7 kg (202 lb 1.6 oz)      Studies/Labs Reviewed:   EKG:  EKG is ordered today.  The ekg ordered today demonstrates atrial paced, ventricular sensed, Q waves from leads V1 to V5, Left axis deviation QTC 403 ms    Lipid  Panel    Component Value Date/Time   CHOL 115 09/03/2014 0803   CHOL 161 07/01/2013 0838   TRIG 80 09/03/2014 0803   TRIG 108 07/01/2013 0838   HDL 35 (L) 09/03/2014 0803   HDL 43 07/01/2013 0838   CHOLHDL 3.3 09/03/2014 0803   VLDL 16 09/03/2014 0803   LDLCALC 64 09/03/2014 0803   LDLCALC 96 07/01/2013 0838    ASSESSMENT:    1. Chronic combined systolic and diastolic CHF (congestive heart failure) ([place])   2. VT (ventricular tachycardia) ([place])   3. ICD  Medtronic protecta dual-chamber, implanted September 2012 primary prevention   4. Coronary artery disease of native artery of native heart with stable angina pectoris ([place])   5. LV (left ventricular) mural thrombus following MI ([place])      PLAN:  In order of problems listed above:  1. CHF: well compensated, NYHA functional class I-II. He has lost weight. He is frequently dizzy now. He is on full dose carvedilol and a relatively low dose of lisinopril due to his blood pressure. Would like to keep on as much beta blockers possible in view of his previous problems with VT. Severely depressed left ventricular systolic function most recent EF less than 20%. He is mildly hypotensive and is experiencing dizziness. Will cut back on his spironolactone. 2. VT: Minimal  ventricular arrhythmia since last device download. 3. ICD: Normal device function, continue remote downloads every 3 months via [employer]. 4. CAD: Seems to have mild angina pectoris only when he perform exertion that exceeds usual activity. There is a little room to increase conventional antianginal medications, because his blood pressure is quite low. Consider Ranexa. 5. LV thrombus: Not seen on most recent echo, remains on warfarin anticoagulation. Has not had atrial fibrillation. Note history of posttraumatic subdural hematoma. 6. Possible sleep apnea: Not clear to me at what stage he is in the diagnostic process.    Medication Adjustments/Labs and Tests Ordered: Current medicines are reviewed at length with the patient today.  Concerns regarding medicines are outlined above.  Medication changes, Labs and Tests ordered today are listed in the Patient Instructions below. Patient Instructions  Dr Sallyanne Kuster has recommended making the following medication changes: 1. DECREASE Spironolactone to ONCE daily  Your physician has requested that you regularly monitor and record your blood pressure readings at home. Please use the same machine at the same time of day to check your readings and record them to bring to your follow-up visit. Please report your blood pressure readings to [place] in 2 weeks. Also, at that time, notify us if you have had any more dizzy spells or chest pains.  Remote monitoring is used to monitor your Pacemaker of ICD from home. This monitoring reduces the number of office visits required to check your device to one time per year. It allows [place] to keep an eye on the functioning of your device to ensure it is working properly. You are scheduled for a device check from home on Wednesday, May 16th, 2018. You may send your transmission at any time that day. If you have a wireless device, the transmission will be sent automatically. After your physician reviews your transmission, you will  receive a postcard with your next transmission date.  Dr Sallyanne Kuster recommends that you schedule a follow-up appointment in 12 months with a pacemaker check. You will receive a reminder letter in the mail two months in advance. If you don't receive a letter, please call our office to schedule the  follow-up appointment.  If you need a refill on your cardiac medications before your next appointment, please call your pharmacy.      Signed, Sanda Klein, MD  08/09/2016 6:29 PM    [address], [place], [place]  [address] Phone: [phone]; Fax: [phone]

## 2016-08-09 NOTE — Patient Instructions (Addendum)
Dr Sallyanne Kuster has recommended making the following medication changes: 1. DECREASE Spironolactone to ONCE daily  Your physician has requested that you regularly monitor and record your blood pressure readings at home. Please use the same machine at the same time of day to check your readings and record them to bring to your follow-up visit. Please report your blood pressure readings to [place] in 2 weeks. Also, at that time, notify us if you have had any more dizzy spells or chest pains.  Remote monitoring is used to monitor your Pacemaker of ICD from home. This monitoring reduces the number of office visits required to check your device to one time per year. It allows [place] to keep an eye on the functioning of your device to ensure it is working properly. You are scheduled for a device check from home on Wednesday, May 16th, 2018. You may send your transmission at any time that day. If you have a wireless device, the transmission will be sent automatically. After your physician reviews your transmission, you will receive a postcard with your next transmission date.  Dr Sallyanne Kuster recommends that you schedule a follow-up appointment in 12 months with a pacemaker check. You will receive a reminder letter in the mail two months in advance. If you don't receive a letter, please call our office to schedule the follow-up appointment.  If you need a refill on your cardiac medications before your next appointment, please call your pharmacy.

## 2016-08-25 ENCOUNTER — Ambulatory Visit (INDEPENDENT_AMBULATORY_CARE_PROVIDER_SITE_OTHER): Payer: Medicaid Other | Admitting: Pharmacist Clinician (PhC)/ Clinical Pharmacy Specialist

## 2016-08-25 DIAGNOSIS — Z7901 Long term (current) use of anticoagulants: Secondary | ICD-10-CM

## 2016-08-25 DIAGNOSIS — I513 Intracardiac thrombosis, not elsewhere classified: Secondary | ICD-10-CM

## 2016-08-25 LAB — POCT INR: INR: 3.3

## 2016-09-06 ENCOUNTER — Ambulatory Visit (INDEPENDENT_AMBULATORY_CARE_PROVIDER_SITE_OTHER): Payer: Medicaid Other | Admitting: Pharmacist

## 2016-09-06 DIAGNOSIS — Z7901 Long term (current) use of anticoagulants: Secondary | ICD-10-CM

## 2016-09-06 DIAGNOSIS — I513 Intracardiac thrombosis, not elsewhere classified: Secondary | ICD-10-CM | POA: Diagnosis not present

## 2016-09-06 LAB — POCT INR: INR: 1.8

## 2016-09-25 ENCOUNTER — Ambulatory Visit (INDEPENDENT_AMBULATORY_CARE_PROVIDER_SITE_OTHER): Payer: Medicaid Other | Admitting: Pharmacist Clinician (PhC)/ Clinical Pharmacy Specialist

## 2016-09-25 ENCOUNTER — Other Ambulatory Visit: Payer: Self-pay | Admitting: Cardiovascular Disease

## 2016-09-25 DIAGNOSIS — Z7901 Long term (current) use of anticoagulants: Secondary | ICD-10-CM | POA: Diagnosis not present

## 2016-09-25 DIAGNOSIS — I513 Intracardiac thrombosis, not elsewhere classified: Secondary | ICD-10-CM

## 2016-09-25 LAB — POCT INR: INR: 2.4

## 2016-09-25 NOTE — Telephone Encounter (Signed)
Rx has been sent to the pharmacy electronically. ° °

## 2016-10-02 ENCOUNTER — Other Ambulatory Visit: Payer: Self-pay | Admitting: Cardiovascular Disease

## 2016-10-04 ENCOUNTER — Other Ambulatory Visit: Payer: Self-pay | Admitting: Cardiovascular Disease

## 2016-10-16 ENCOUNTER — Ambulatory Visit (INDEPENDENT_AMBULATORY_CARE_PROVIDER_SITE_OTHER): Payer: Medicaid Other | Admitting: Pharmacist Clinician (PhC)/ Clinical Pharmacy Specialist

## 2016-10-16 DIAGNOSIS — I513 Intracardiac thrombosis, not elsewhere classified: Secondary | ICD-10-CM | POA: Diagnosis not present

## 2016-10-16 DIAGNOSIS — Z7901 Long term (current) use of anticoagulants: Secondary | ICD-10-CM

## 2016-10-16 LAB — POCT INR: INR: 3.9

## 2016-10-19 ENCOUNTER — Other Ambulatory Visit: Payer: Self-pay | Admitting: Cardiovascular Disease

## 2016-10-30 ENCOUNTER — Ambulatory Visit (INDEPENDENT_AMBULATORY_CARE_PROVIDER_SITE_OTHER): Payer: Medicaid Other | Admitting: Pharmacist Clinician (PhC)/ Clinical Pharmacy Specialist

## 2016-10-30 DIAGNOSIS — Z7901 Long term (current) use of anticoagulants: Secondary | ICD-10-CM

## 2016-10-30 DIAGNOSIS — I513 Intracardiac thrombosis, not elsewhere classified: Secondary | ICD-10-CM | POA: Diagnosis not present

## 2016-10-30 LAB — POCT INR: INR: 3.4

## 2016-11-08 ENCOUNTER — Telehealth: Payer: Self-pay | Admitting: Cardiology

## 2016-11-08 ENCOUNTER — Ambulatory Visit (INDEPENDENT_AMBULATORY_CARE_PROVIDER_SITE_OTHER): Payer: Medicaid Other | Admitting: *Deleted

## 2016-11-08 DIAGNOSIS — I472 Ventricular tachycardia, unspecified: Secondary | ICD-10-CM

## 2016-11-08 LAB — CUP PACEART REMOTE DEVICE CHECK
Brady Statistic AP VP Percent: 0.03 %
Brady Statistic AP VS Percent: 78.21 %
Brady Statistic AS VP Percent: 0.01 %
Brady Statistic RV Percent Paced: 0.04 %
Date Time Interrogation Session: 20180516163852
HIGH POWER IMPEDANCE MEASURED VALUE: 50 Ohm
HIGH POWER IMPEDANCE MEASURED VALUE: 66 Ohm
HighPow Impedance: 399 Ohm
Implantable Lead Implant Date: 20120906
Implantable Lead Location: 753859
Implantable Lead Location: 753860
Implantable Lead Model: 185
Lead Channel Pacing Threshold Amplitude: 0.5 V
Lead Channel Pacing Threshold Pulse Width: 0.4 ms
Lead Channel Sensing Intrinsic Amplitude: 2.375 mV
Lead Channel Sensing Intrinsic Amplitude: 7.25 mV
Lead Channel Sensing Intrinsic Amplitude: 7.25 mV
Lead Channel Setting Pacing Amplitude: 1.5 V
Lead Channel Setting Pacing Amplitude: 2 V
Lead Channel Setting Pacing Pulse Width: 0.4 ms
Lead Channel Setting Sensing Sensitivity: 0.3 mV
MDC IDC LEAD IMPLANT DT: 20120906
MDC IDC LEAD SERIAL: 358872
MDC IDC MSMT BATTERY VOLTAGE: 2.96 V
MDC IDC MSMT LEADCHNL RA IMPEDANCE VALUE: 399 Ohm
MDC IDC MSMT LEADCHNL RA PACING THRESHOLD AMPLITUDE: 0.375 V
MDC IDC MSMT LEADCHNL RA PACING THRESHOLD PULSEWIDTH: 0.4 ms
MDC IDC MSMT LEADCHNL RA SENSING INTR AMPL: 2.375 mV
MDC IDC MSMT LEADCHNL RV IMPEDANCE VALUE: 399 Ohm
MDC IDC PG IMPLANT DT: 20120906
MDC IDC STAT BRADY AS VS PERCENT: 21.75 %
MDC IDC STAT BRADY RA PERCENT PACED: 77.83 %

## 2016-11-08 NOTE — Telephone Encounter (Signed)
Spoke with pt and reminded pt of remote transmission that is due today. Pt verbalized understanding.   

## 2016-11-08 NOTE — Progress Notes (Signed)
Remote ICD transmission.   

## 2016-11-09 ENCOUNTER — Encounter: Payer: Self-pay | Admitting: Cardiology

## 2016-11-13 ENCOUNTER — Ambulatory Visit (INDEPENDENT_AMBULATORY_CARE_PROVIDER_SITE_OTHER): Payer: Medicaid Other | Admitting: Pharmacist

## 2016-11-13 DIAGNOSIS — I513 Intracardiac thrombosis, not elsewhere classified: Secondary | ICD-10-CM

## 2016-11-13 DIAGNOSIS — Z7901 Long term (current) use of anticoagulants: Secondary | ICD-10-CM | POA: Diagnosis not present

## 2016-11-13 LAB — POCT INR: INR: 2.7

## 2016-12-05 ENCOUNTER — Ambulatory Visit (INDEPENDENT_AMBULATORY_CARE_PROVIDER_SITE_OTHER): Payer: Medicaid Other | Admitting: Pharmacist Clinician (PhC)/ Clinical Pharmacy Specialist

## 2016-12-05 DIAGNOSIS — I513 Intracardiac thrombosis, not elsewhere classified: Secondary | ICD-10-CM

## 2016-12-05 DIAGNOSIS — Z7901 Long term (current) use of anticoagulants: Secondary | ICD-10-CM

## 2016-12-05 LAB — POCT INR: INR: 1.9

## 2016-12-25 ENCOUNTER — Telehealth: Payer: Self-pay | Admitting: Cardiovascular Disease

## 2016-12-25 NOTE — Telephone Encounter (Signed)
Called patient, unable to reach left message to give us a call back. 

## 2016-12-25 NOTE — Telephone Encounter (Signed)
New message    Pt is calling because his pharmacy called him saying he needs an appt with Dr. Claiborne Billings for his refill. He has appt in September. Does pt need an appt before then? Please call.

## 2016-12-26 ENCOUNTER — Ambulatory Visit (INDEPENDENT_AMBULATORY_CARE_PROVIDER_SITE_OTHER): Payer: Medicaid Other | Admitting: Pharmacist

## 2016-12-26 DIAGNOSIS — Z7901 Long term (current) use of anticoagulants: Secondary | ICD-10-CM | POA: Diagnosis not present

## 2016-12-26 DIAGNOSIS — I513 Intracardiac thrombosis, not elsewhere classified: Secondary | ICD-10-CM

## 2016-12-26 LAB — POCT INR: INR: 2.7

## 2016-12-26 MED ORDER — FUROSEMIDE 40 MG PO TABS: 40.0000 mg | ORAL_TABLET | Freq: Every day | ORAL | 2 refills | Status: DC

## 2017-01-23 ENCOUNTER — Ambulatory Visit (INDEPENDENT_AMBULATORY_CARE_PROVIDER_SITE_OTHER): Payer: Medicaid Other | Admitting: Pharmacist

## 2017-01-23 DIAGNOSIS — Z7901 Long term (current) use of anticoagulants: Secondary | ICD-10-CM

## 2017-01-23 DIAGNOSIS — I513 Intracardiac thrombosis, not elsewhere classified: Secondary | ICD-10-CM

## 2017-01-23 LAB — POCT INR: INR: 3.2

## 2017-02-07 ENCOUNTER — Ambulatory Visit (INDEPENDENT_AMBULATORY_CARE_PROVIDER_SITE_OTHER): Payer: Medicaid Other | Admitting: *Deleted

## 2017-02-07 DIAGNOSIS — I255 Ischemic cardiomyopathy: Secondary | ICD-10-CM

## 2017-02-07 DIAGNOSIS — I472 Ventricular tachycardia, unspecified: Secondary | ICD-10-CM

## 2017-02-07 DIAGNOSIS — I5042 Chronic combined systolic (congestive) and diastolic (congestive) heart failure: Secondary | ICD-10-CM | POA: Diagnosis not present

## 2017-02-07 NOTE — Progress Notes (Signed)
Remote defibrillator check.  

## 2017-02-08 ENCOUNTER — Other Ambulatory Visit: Payer: Self-pay | Admitting: Cardiovascular Disease

## 2017-02-08 LAB — CUP PACEART REMOTE DEVICE CHECK
Brady Statistic AP VS Percent: 76 %
Brady Statistic AS VP Percent: 0.01 %
Brady Statistic AS VS Percent: 23.96 %
HIGH POWER IMPEDANCE MEASURED VALUE: 74 Ohm
HighPow Impedance: 437 Ohm
HighPow Impedance: 53 Ohm
Implantable Lead Implant Date: 20120906
Implantable Lead Implant Date: 20120906
Implantable Lead Model: 5076
Implantable Lead Serial Number: 358872
Lead Channel Impedance Value: 437 Ohm
Lead Channel Pacing Threshold Amplitude: 0.5 V
Lead Channel Pacing Threshold Pulse Width: 0.4 ms
Lead Channel Sensing Intrinsic Amplitude: 1.625 mV
Lead Channel Setting Pacing Pulse Width: 0.4 ms
MDC IDC LEAD LOCATION: 753859
MDC IDC LEAD LOCATION: 753860
MDC IDC MSMT BATTERY VOLTAGE: 2.93 V
MDC IDC MSMT LEADCHNL RA PACING THRESHOLD AMPLITUDE: 0.5 V
MDC IDC MSMT LEADCHNL RA SENSING INTR AMPL: 1.625 mV
MDC IDC MSMT LEADCHNL RV IMPEDANCE VALUE: 437 Ohm
MDC IDC MSMT LEADCHNL RV PACING THRESHOLD PULSEWIDTH: 0.4 ms
MDC IDC MSMT LEADCHNL RV SENSING INTR AMPL: 7 mV
MDC IDC MSMT LEADCHNL RV SENSING INTR AMPL: 7 mV
MDC IDC PG IMPLANT DT: 20120906
MDC IDC SESS DTM: 20180815062609
MDC IDC SET LEADCHNL RA PACING AMPLITUDE: 1.5 V
MDC IDC SET LEADCHNL RV PACING AMPLITUDE: 2 V
MDC IDC SET LEADCHNL RV SENSING SENSITIVITY: 0.3 mV
MDC IDC STAT BRADY AP VP PERCENT: 0.03 %
MDC IDC STAT BRADY RA PERCENT PACED: 75.6 %
MDC IDC STAT BRADY RV PERCENT PACED: 0.04 %

## 2017-02-20 ENCOUNTER — Ambulatory Visit (INDEPENDENT_AMBULATORY_CARE_PROVIDER_SITE_OTHER): Payer: Medicaid Other | Admitting: Pharmacist Clinician (PhC)/ Clinical Pharmacy Specialist

## 2017-02-20 ENCOUNTER — Encounter: Payer: Self-pay | Admitting: Cardiology

## 2017-02-20 DIAGNOSIS — I513 Intracardiac thrombosis, not elsewhere classified: Secondary | ICD-10-CM

## 2017-02-20 DIAGNOSIS — Z7901 Long term (current) use of anticoagulants: Secondary | ICD-10-CM | POA: Diagnosis not present

## 2017-02-20 LAB — POCT INR: INR: 1.7

## 2017-02-27 ENCOUNTER — Other Ambulatory Visit: Payer: Self-pay | Admitting: Cardiovascular Disease

## 2017-02-28 ENCOUNTER — Telehealth: Payer: Self-pay | Admitting: Cardiovascular Disease

## 2017-02-28 NOTE — Telephone Encounter (Signed)
Received records from [facility] for appointment on 03/20/17 with Dr Claiborne Billings.  Records put with Dr Evette Georges schedule for 03/20/17. lp

## 2017-03-13 ENCOUNTER — Telehealth: Payer: Self-pay | Admitting: Cardiovascular Disease

## 2017-03-13 ENCOUNTER — Encounter: Payer: Self-pay | Admitting: Cardiovascular Disease

## 2017-03-13 NOTE — Telephone Encounter (Signed)
Epicd. Please call him and told him to stop his aspirin permanently right now. He will resume only warfarin after surgery. MCr

## 2017-03-13 NOTE — Telephone Encounter (Signed)
Received incoming records from [place] for upcoming appointment on 03/20/17 @ 9:20am with Dr. Claiborne Billings. Records given to [facility] in Medical Records. 03/13/17 ab

## 2017-03-13 NOTE — Telephone Encounter (Signed)
New Message        [facility] Pre-operative Risk Assessment    Request for surgical clearance:  1. What type of surgery is being performed?  3 piece penal prosthetic   When is this surgery scheduled?  Not schedule yet  2. Are there any medications that need to be held prior to surgery and how long?  warafrin and aspirin   3. Name of physician performing surgery?  Dr Juliane Lack [place]    4. What is your office phone and fax number?  [phone] fax [place] 03/13/2017, 9:15 AM  _________________________________________________________________   (provider comments below)

## 2017-03-14 ENCOUNTER — Other Ambulatory Visit: Payer: Self-pay | Admitting: Urology

## 2017-03-14 NOTE — Telephone Encounter (Signed)
Left message to call back  

## 2017-03-15 NOTE — Telephone Encounter (Signed)
Patient made aware to stop ASA per Dr. Sallyanne Kuster.   Patient verbalized understanding.

## 2017-03-15 NOTE — Telephone Encounter (Signed)
Follow Up   Pt calling back to speak with [facility]

## 2017-03-20 ENCOUNTER — Ambulatory Visit (INDEPENDENT_AMBULATORY_CARE_PROVIDER_SITE_OTHER): Payer: Medicaid Other | Admitting: Cardiovascular Disease

## 2017-03-20 ENCOUNTER — Ambulatory Visit: Payer: Medicaid Other | Admitting: Cardiovascular Disease

## 2017-03-20 ENCOUNTER — Encounter: Payer: Self-pay | Admitting: Cardiovascular Disease

## 2017-03-20 ENCOUNTER — Ambulatory Visit (INDEPENDENT_AMBULATORY_CARE_PROVIDER_SITE_OTHER): Payer: Medicaid Other | Admitting: Pharmacist Clinician (PhC)/ Clinical Pharmacy Specialist

## 2017-03-20 VITALS — BP 149/96 | HR 72 | Ht 70.0 in | Wt 193.4 lb

## 2017-03-20 DIAGNOSIS — E785 Hyperlipidemia, unspecified: Secondary | ICD-10-CM | POA: Diagnosis not present

## 2017-03-20 DIAGNOSIS — I513 Intracardiac thrombosis, not elsewhere classified: Secondary | ICD-10-CM | POA: Diagnosis not present

## 2017-03-20 DIAGNOSIS — I25810 Atherosclerosis of coronary artery bypass graft(s) without angina pectoris: Secondary | ICD-10-CM | POA: Diagnosis not present

## 2017-03-20 DIAGNOSIS — Z01818 Encounter for other preprocedural examination: Secondary | ICD-10-CM

## 2017-03-20 DIAGNOSIS — I5042 Chronic combined systolic (congestive) and diastolic (congestive) heart failure: Secondary | ICD-10-CM

## 2017-03-20 DIAGNOSIS — I255 Ischemic cardiomyopathy: Secondary | ICD-10-CM

## 2017-03-20 DIAGNOSIS — Z7901 Long term (current) use of anticoagulants: Secondary | ICD-10-CM

## 2017-03-20 DIAGNOSIS — N183 Chronic kidney disease, stage 3 unspecified: Secondary | ICD-10-CM

## 2017-03-20 LAB — POCT INR: INR: 4.1

## 2017-03-20 NOTE — Patient Instructions (Signed)
Medication Instructions:  Your physician recommends that you continue on your current medications as directed. Please refer to the Current Medication list given to you today.  Labwork: NONE  Testing/Procedures: Your physician has requested that you have an echocardiogram. Echocardiography is a painless test that uses sound waves to create images of your heart. It provides your doctor with information about the size and shape of your heart and how well your heart's chambers and valves are working. This procedure takes approximately one hour. There are no restrictions for this procedure.  This will be done at our [facility] location:  [place]: Your physician wants you to follow-up in: 6 MONTHS with Dr. Claiborne Billings.  You will receive a reminder letter in the mail two months in advance. If you don't receive a letter, please call our office to schedule the follow-up appointment.   Any Other Special Instructions Will Be Listed Below (If Applicable).     If you need a refill on your cardiac medications before your next appointment, please call your pharmacy.

## 2017-03-20 NOTE — Progress Notes (Signed)
Patient ID: Steven [name], male   DOB: [birth year], 61 y.o.   MRN: [id number]   HPI: Steven [name] is a 61 y.o. male who presents to the office today for a 24  month follow-up cardiology evaluation.  Steven [name] is originally from [place] and suffered a large out of hospital anterior wall myocardial infarction in January 2012. [name]  Steven [name] presented after a three-hour delay to the hospital and was taken emergently to the cardiac catheterization laboratory where I performed successful emergent intervention with the door to balloon time of only 25 minutes. His LAD was successfully recanalized. CPK increased to 8012 with an MB of 716 and troponin was greater than 100 initially. Steven [name] subsequently wore a life vest and ultimately after several months without significant improvement of LV function Steven [name] underwent implantable cardio defibrillator insertion.  Interrogation of his defibrillator in the past had shown episodes of nonsustained VT and his dose of carvedilol has been further titrated.  A followup echo Doppler study on 08/04/2013 showed an ejection fraction of 20-25% with diffuse hypokinesis but there was akinesis of the entire anteroseptal myocardium and apical region. There now appear to be an apparent medium-sized mural apical thrombus which was not noted on his last echo. Steven [name] denies paresthesias. Steven [name] denies episodes of chest pain. Steven [name] denies shortness of breath.At his last office visit in February in light of his moderate sized mural thrombus, Plavix, was discontinued, and Steven [name] was started on Coumadin therapy.  In August 2015 Steven [name] tripped and hit his head on a bar recently developed severe headache, transient leg weakness, which evolves into slurred speech.  Steven [name] underwent evaluation 1-1/2 weeks later and was found  to have a subdural hematoma.  Steven [name] underwent a right burr hole subdural hematoma evacuation on 02/05/2014 by [name] at [facility].  Initially aspirin and Coumadin were on hold.  Steven [name] had a follow-up  CT scan and ultimately was given clearance to reinstitute Coumadin anticoagulation in light of his severe cardiomyopathy with an ejection fraction of 15-20% on echo on 03/13/2014 and medium-sized apical mural thrombus which had been documented on a previous echo.  This ultimately has stabilized and Steven [name] has been back on warfarin anticoagulation.    Presently, Steven [name] denies any chest pain.  Steven [name] denies shortness of breath.  Steven [name]'s unaware of any recurrent palpitations.  His ICD was  last interrogated one month ago by Dr. [name].  Steven [name] was noted to have very short nonsustained bursts of nonsustained VT with one episode lasting 8 beats at 212 bpm on 07/09/2014.  His device was adjusted so as it would intervene more quickly and avoid syncope.    I have not seen him since September 2016.  Over this time period, Steven [name] has been without recurrent chest pain.  At times Steven [name] has had some occasional dizziness and weakness.  Steven [name] saw Dr. Sallyanne Kuster on August 09 2016.  His device was interrogated and showed 72% atrial pacing and no ventricular pacing.  There was one 8 beat episode of nonsustained VT since his last device check.  His last echo Doppler study was in September 2015.  Steven [name] is recently been evaluated by Dr. Junious Silk and is scheduled to undergo penile implantation.  Preoperative clearance was given by Dr. Sallyanne Kuster was advised that Steven [name] hold his warfarin and Steven [name] will stop his aspirin permanently.  Steven [name] was recently seen at [facility] and was felt to have stable stage III chronic kidney disease.  His blood pressure was controlled and  Steven [name] was euvolemic.  Steven [name] presents today for follow-up evaluation.  Past Medical History:  Diagnosis Date  . Acute MI, anterior wall ([place])   . CAD (coronary artery disease)    2D ECHO, 07/13/2011 - EF <25%, LV moderatelty dilated, LA moderately dilatedLEXISCAN, 12/14/2011 - moderate-severe perfusion defect seen in the basal anteroseptal, mid anterior, apicacl anterior, apical, apical inferior, and  apical lateral regions, post-stress EF 25%, new EKG changes from baseline abnormalities  . Inguinal hernia, left     Past Surgical History:  Procedure Laterality Date  . CARDIAC CATHETERIZATION  11/25/2010   Predilation balloon-Apex monorail 2x26m, Cutting balloon-2.25x180m resulting in a reduction of 100% stenosis down to less than 10%  . CARDIAC CATHETERIZATION  07/12/2010   LAD stented with a [id number]-metal Veriflex stent resulting in a reduction of 100% lesion to 0%  . CARDIAC DEFIBRILLATOR PLACEMENT  03/02/2011   Medtronic Protecta XT DR, model #[id number] #[id number] . PACEMAKER PLACEMENT  07/14/2010   Temporary placement of pacemaker, if rhythm issue continues will need a permanent device    No Known Allergies  Current Outpatient Prescriptions  Medication Sig Dispense Refill  . albuterol (PROVENTIL HFA;VENTOLIN HFA) 108 (90 Base) MCG/ACT inhaler Inhale 1-2 puffs into the lungs every 6 (six) hours as needed for wheezing or shortness of breath.    . aMarland Kitchenorvastatin (LIPITOR) 40 MG tablet TAKE ONE TABLET BY MOUTH ONCE DAILY 90 tablet 1  . beclomethasone (QVAR) 80 MCG/ACT inhaler Inhale 1 puff into the lungs 2 (two) times daily.    . carvedilol (COREG) 25 MG tablet TAKE ONE TABLET BY MOUTH TWICE DAILY WITH A MEAL 60 tablet 11  . clomiPHENE (CLOMID) 50 MG tablet Take 25 mg by mouth every other day.    . furosemide (LASIX) 40 MG tablet Take 1 tablet (40 mg total) by mouth daily. 30 tablet 2  . gabapentin (NEURONTIN) 300 MG capsule Take 300 mg by mouth at bedtime.    . isosorbide mononitrate (IMDUR) 30 MG 24 hr tablet TAKE ONE TABLET BY MOUTH ONCE DAILY 90 tablet 0  . lisinopril (PRINIVIL,ZESTRIL) 5 MG tablet TAKE ONE TABLET BY MOUTH ONCE DAILY 30 tablet 11  . Multiple Vitamin (MULTIVITAMIN) tablet Take 1 tablet by mouth daily.    . NMarland KitchenTROSTAT 0.4 MG SL tablet DISSOLVE ONE TABLET UNDER THE TONGUE EVERY 5 MINUTES AS NEEDED FOR CHEST PAIN.  DO NOT EXCEED A TOTAL OF 3 DOSES IN 15  MINUTES 25 tablet 1  . spironolactone (ALDACTONE) 25 MG tablet TAKE ONE-HALF TABLET BY MOUTH TWICE DAILY 90 tablet 2  . tamsulosin (FLOMAX) 0.4 MG CAPS capsule Take 0.4 mg by mouth daily.    . tMarland Kitchenotropium (SPIRIVA) 18 MCG inhalation capsule Place 18 mcg into inhaler and inhale daily.    . traZODone (DESYREL) 50 MG tablet Take 50 mg by mouth at bedtime.    . wMarland Kitchenrfarin (COUMADIN) 5 MG tablet Take 1/2 to 1 tablet daily as directed by coumadin clinic 30 tablet 5   No current facility-administered medications for this visit.    Social Steven [name] is single. Steven [name] has one child. [name]. Steven [name] does drink occasional alcohol.   ROS General: Negative; No fevers, chills, or night sweats;  HEENT: Negative; No changes in vision or hearing, sinus congestion, difficulty swallowing Pulmonary: Negative; No cough, wheezing, shortness of breath, hemoptysis Cardiovascular: his previous dizziness has improved; No chest pain, presyncope, syncope, palpitations GI: Negative; No nausea, vomiting, diarrhea, or  abdominal pain GU: Positive for erectile dysfunction.; No dysuria, hematuria, or difficulty voiding Musculoskeletal: Positive for history of an inguinal hernia; no myalgias, joint pain, or weakness Hematologic/Oncology: Negative; no easy bruising, bleeding Endocrine: Negative; no heat/cold intolerance; no diabetes Neuro: Status post recent subdural hematoma evacuation Skin: Negative; No rashes or skin lesions Psychiatric: Negative; No behavioral problems, depression Sleep: Negative; No snoring, daytime sleepiness, hypersomnolence, bruxism, restless legs, hypnogognic hallucinations, no cataplexy Other comprehensive 14 point system review is negative.  PE BP (!) 149/96   Pulse 72   Ht 5' 10"  (1.778 m)   Wt 193 lb 6.4 oz (87.7 kg)   BMI 27.75 kg/m    Repeat blood pressure by me was 144/88  Wt Readings from Last 3 Encounters:  03/20/17 193 lb 6.4 oz (87.7 kg)    08/09/16 200 lb (90.7 kg)  08/17/15 206 lb 4 oz (93.6 kg)   General: Alert, oriented, no distress.  Skin: normal turgor, no rashes, warm and dry HEENT: Normocephalic, atraumatic. Pupils equal round and reactive to light; sclera anicteric; extraocular muscles intact;  Nose without nasal septal hypertrophy Mouth/Parynx benign; Mallinpatti scale 3 Neck: No JVD, no carotid bruits; normal carotid upstroke Lungs: clear to ausculatation and percussion; no wheezing or rales Chest wall: without tenderness to palpitation Heart: PMI not displaced, RRR, s1 s2 normal, 1/6 systolic murmur, no diastolic murmur, no rubs, gallops, thrills, or heaves Abdomen: soft, nontender; no hepatosplenomehaly, BS+; abdominal aorta nontender and not dilated by palpation. Back: no CVA tenderness Pulses 2+ Musculoskeletal: full range of motion, normal strength, no joint deformities Extremities: no clubbing cyanosis or edema, Homan's sign negative  Neurologic: grossly nonfocal; Cranial nerves grossly wnl Psychologic: Normal mood and affect   ECG (independently read by me): Atrially paced rhythm at 72 bpm.  One PVC.  Prominent progression consistent with old anterior infarction.  Anterolateral ST-T changes  September 2016 ECG (independently read by me): Atrially paced rhythm at 64 bpm.  No ectopy.  Poor anterior R-wave progression concordant with old anterior MI.  March 2016 ECG (independently read by me): Atrially paced rhythm at 74 bpm.  Increased PR interval at 212 ms.  ST-T changes anterolaterally.  November 2015 ECG (independently read by me): Atrially paced rhythm.  Old anterior wall MI.  Per Ms. Lee noted ST-T changes laterally  Prior September 2015 ECG (independently read by me): Sinus rhythm at 63 beats per minute.  Old anterior wall myocardial infarction, poor R. progression V1 through V4 and T-wave changes V3 through V6, leads 1, and L.    08/18/2013 ECG (independently read by me):  Atrially paced at 64  beats per minute. QTc interval 427 ms. QRS duration 98 ms. Diffuse anterolateral T-wave changes secondary to his prior MI   Prior ECG of 02/27/2013: Atrial  paced rhythm at 64 beats per minute. Evidence for his prior anterolateral wall myocardial infarction with diffuse T-wave abnormality and precordial Q waves V1 through V5  LABS:  BMP Latest Ref Rng & Units 09/03/2014 07/01/2013 12/13/2012  Glucose 70 - 99 mg/dL 84 110(H) 77  BUN 6 - 23 mg/dL 15 19 20   Creatinine 0.50 - 1.35 mg/dL 1.44(H) 1.63(H) 1.67(H)  Sodium 135 - 145 mEq/L 140 138 135  Potassium 3.5 - 5.3 mEq/L 4.4 4.6 4.1  Chloride 96 - 112 mEq/L 106 101 103  CO2 19 - 32 mEq/L 25 27 25   Calcium 8.4 - 10.5 mg/dL 8.9 9.3 9.1    Hepatic Function Latest Ref Rng & Units 09/03/2014 07/01/2013 12/13/2012  Total Protein 6.0 - 8.3 g/dL 6.8 7.6 6.9  Albumin 3.5 - 5.2 g/dL 4.1 4.2 4.0  AST 0 - 37 U/L 25 23 24   ALT 0 - 53 U/L 17 11 12   Alk Phosphatase 39 - 117 U/L 50 55 55  Total Bilirubin 0.2 - 1.2 mg/dL 0.5 0.7 0.9  Bilirubin, Direct 0.0 - 0.3 mg/dL - - -    CBC Latest Ref Rng & Units 09/03/2014 07/01/2013 12/13/2012  WBC 4.0 - 10.5 K/uL 6.2 7.7 5.8  Hemoglobin 13.0 - 17.0 g/dL 15.9 15.6 16.0  Hematocrit 39.0 - 52.0 % 46.7 44.5 45.1  Platelets 150 - 400 K/uL 165 191 147(L)   Lab Results  Component Value Date   MCV 93.4 09/03/2014   MCV 90.4 07/01/2013   MCV 90.4 12/13/2012   Lab Results  Component Value Date   TSH 1.500 09/03/2014   Lab Results  Component Value Date   HGBA1C (H) 11/23/2010    5.8 (NOTE)                                                                       According to the ADA Clinical Practice Recommendations for 2011, when HbA1c is used as a screening test:   >=6.5%   Diagnostic of Diabetes Mellitus           (if abnormal result  is confirmed)  5.7-6.4%   Increased risk of developing Diabetes Mellitus  References:Diagnosis and Classification of Diabetes Mellitus,Diabetes TMAU,6333,54(TGYBW 1):S62-S69 and Standards  of Medical Care in         Diabetes - 2011,Diabetes Care,2011,34  (Suppl 1):S11-S61.  ipid Panel     Component Value Date/Time   CHOL 115 09/03/2014 0803   CHOL 161 07/01/2013 0838   TRIG 80 09/03/2014 0803   TRIG 108 07/01/2013 0838   HDL 35 (L) 09/03/2014 0803   HDL 43 07/01/2013 0838   CHOLHDL 3.3 09/03/2014 0803   VLDL 16 09/03/2014 0803   LDLCALC 64 09/03/2014 0803   LDLCALC 96 07/01/2013 0838    INR today 2.1  RADIOLOGY: No results found.  IMPRESSION: 1. Chronic combined systolic and diastolic CHF (congestive heart failure) ([place])   2. Coronary artery disease involving coronary bypass graft of native heart without angina pectoris   3. Cardiomyopathy, ischemic   4. Hyperlipidemia LDL goal <70   5. Preoperative clearance   6. Long term current use of anticoagulant therapy   7. Stage III chronic kidney disease [facility])     ASSESSMENT AND PLAN: Steven. [name] is a 60 year-old African  male who is s/p a large anterior wall myocardial infarction suffered in January 2012.. A subsequent  cardiac catheterization  on June 2012 showed a patent stent in the LAD but Steven [name] had an occluded marginal branch of his LCX with collaterals. There also was 80% first diagonal stenosis in a small vessel and 40% RCA stenosis. Steven [name] has ischemic cardiomyopathy and his last echo in 2015   revealed an ejection fraction of 15-20%.  There was akinesis of his apical myocardium, but on this echo there was no mention of any of the previously noted medium-size apical thrombus.  Steven [name] has been on Coumadin therapy and aspirin.  These had been held when Steven [name] suffered  a fall resulting in subdural hematoma that required surgical evacuation in August 2015.  Clinically, Steven [name] appears euvolemic.  His blood pressure is stable without orthostatic change.  Steven [name] will be undergoing a penal implant by Dr. Junious Silk on 04/06/2017.  Clearance was given by Dr. Sallyanne Kuster and was advised that Steven [name] stop aspirin and Steven [name] will hold warfarin prior to surgery.   After surgery, it was recommended that warfarin be renewed renewed and that Steven [name] not be restarted back on aspirin therapy.  I have recommended that Steven [name] undergo a follow-up echo Doppler study at his convenience, and this does not need to be done prior to surgery.  This will provide additional data regarding his ischemic heart myopathy and ascertain if there has been any improvement in his LV dysfunction.  His ICD was recently evaluated.  Steven [name] has not had any defibrillator discharges.  I reviewed records from his nephrologist office.  I will see him in 6 was for follow-up Cardiologic evaluation.    Troy Sine, MD, [facility]  03/22/2017 7:53 PM

## 2017-03-26 ENCOUNTER — Other Ambulatory Visit: Payer: Self-pay

## 2017-03-26 MED ORDER — FUROSEMIDE 40 MG PO TABS: 40.0000 mg | ORAL_TABLET | Freq: Every day | ORAL | 2 refills | Status: DC

## 2017-03-30 ENCOUNTER — Other Ambulatory Visit (HOSPITAL_COMMUNITY): Payer: Medicaid Other

## 2017-04-02 NOTE — Progress Notes (Addendum)
Reviewing chart for pre-op call interview.  Pt has ef 15-20% .  Per anesthesia guidelines ef < 40% is not candidate for ambulatory surgery center.  Called and lm for coni, or scheduler for dr eskridge, that will need to be done main or.

## 2017-04-11 ENCOUNTER — Other Ambulatory Visit (HOSPITAL_COMMUNITY): Payer: Medicaid Other

## 2017-04-17 ENCOUNTER — Ambulatory Visit (INDEPENDENT_AMBULATORY_CARE_PROVIDER_SITE_OTHER): Payer: Medicaid Other | Admitting: Pharmacist Clinician (PhC)/ Clinical Pharmacy Specialist

## 2017-04-17 DIAGNOSIS — Z7901 Long term (current) use of anticoagulants: Secondary | ICD-10-CM | POA: Diagnosis not present

## 2017-04-17 DIAGNOSIS — I513 Intracardiac thrombosis, not elsewhere classified: Secondary | ICD-10-CM | POA: Diagnosis not present

## 2017-04-17 LAB — POCT INR: INR: 2.2

## 2017-04-18 ENCOUNTER — Other Ambulatory Visit: Payer: Self-pay

## 2017-04-18 ENCOUNTER — Ambulatory Visit (HOSPITAL_COMMUNITY): Payer: Medicaid Other | Attending: Cardiovascular Disease

## 2017-04-18 DIAGNOSIS — I255 Ischemic cardiomyopathy: Secondary | ICD-10-CM | POA: Diagnosis not present

## 2017-04-18 DIAGNOSIS — I25810 Atherosclerosis of coronary artery bypass graft(s) without angina pectoris: Secondary | ICD-10-CM

## 2017-04-18 DIAGNOSIS — Z87891 Personal history of nicotine dependence: Secondary | ICD-10-CM | POA: Diagnosis not present

## 2017-04-18 DIAGNOSIS — I252 Old myocardial infarction: Secondary | ICD-10-CM | POA: Insufficient documentation

## 2017-04-18 DIAGNOSIS — E785 Hyperlipidemia, unspecified: Secondary | ICD-10-CM | POA: Diagnosis not present

## 2017-04-18 DIAGNOSIS — I5042 Chronic combined systolic (congestive) and diastolic (congestive) heart failure: Secondary | ICD-10-CM | POA: Diagnosis not present

## 2017-04-18 MED ORDER — PERFLUTREN LIPID MICROSPHERE
1.0000 mL | INTRAVENOUS | Status: AC | PRN
  Administered 2017-04-18: 2 mL via INTRAVENOUS

## 2017-05-09 ENCOUNTER — Ambulatory Visit (INDEPENDENT_AMBULATORY_CARE_PROVIDER_SITE_OTHER): Payer: Medicaid Other | Admitting: *Deleted

## 2017-05-09 ENCOUNTER — Telehealth: Payer: Self-pay | Admitting: Cardiology

## 2017-05-09 DIAGNOSIS — I472 Ventricular tachycardia, unspecified: Secondary | ICD-10-CM

## 2017-05-09 NOTE — Telephone Encounter (Signed)
Spoke with pt and reminded pt of remote transmission that is due today. Pt verbalized understanding.   

## 2017-05-10 LAB — CUP PACEART REMOTE DEVICE CHECK
Battery Voltage: 2.86 V
Brady Statistic AS VP Percent: 0.01 %
Brady Statistic RA Percent Paced: 81.24 %
Brady Statistic RV Percent Paced: 0.04 %
Date Time Interrogation Session: 20181115064344
HIGH POWER IMPEDANCE MEASURED VALUE: 399 Ohm
HIGH POWER IMPEDANCE MEASURED VALUE: 78 Ohm
HighPow Impedance: 54 Ohm
Implantable Lead Implant Date: 20120906
Implantable Lead Location: 753859
Implantable Lead Model: 185
Implantable Lead Serial Number: 358872
Implantable Pulse Generator Implant Date: 20120906
Lead Channel Impedance Value: 399 Ohm
Lead Channel Pacing Threshold Amplitude: 0.625 V
Lead Channel Pacing Threshold Pulse Width: 0.4 ms
Lead Channel Sensing Intrinsic Amplitude: 7.375 mV
Lead Channel Sensing Intrinsic Amplitude: 7.375 mV
Lead Channel Setting Pacing Amplitude: 1.5 V
Lead Channel Setting Pacing Pulse Width: 0.4 ms
Lead Channel Setting Sensing Sensitivity: 0.3 mV
MDC IDC LEAD IMPLANT DT: 20120906
MDC IDC LEAD LOCATION: 753860
MDC IDC MSMT LEADCHNL RA IMPEDANCE VALUE: 437 Ohm
MDC IDC MSMT LEADCHNL RA PACING THRESHOLD AMPLITUDE: 0.5 V
MDC IDC MSMT LEADCHNL RA SENSING INTR AMPL: 2.375 mV
MDC IDC MSMT LEADCHNL RA SENSING INTR AMPL: 2.375 mV
MDC IDC MSMT LEADCHNL RV PACING THRESHOLD PULSEWIDTH: 0.4 ms
MDC IDC SET LEADCHNL RV PACING AMPLITUDE: 2 V
MDC IDC STAT BRADY AP VP PERCENT: 0.03 %
MDC IDC STAT BRADY AP VS PERCENT: 82.26 %
MDC IDC STAT BRADY AS VS PERCENT: 17.7 %

## 2017-05-10 NOTE — Progress Notes (Signed)
Remote ICD transmission.   

## 2017-05-11 ENCOUNTER — Encounter (HOSPITAL_COMMUNITY): Admission: RE | Payer: Self-pay | Source: Ambulatory Visit

## 2017-05-11 ENCOUNTER — Encounter: Payer: Self-pay | Admitting: Cardiology

## 2017-05-11 ENCOUNTER — Ambulatory Visit (HOSPITAL_COMMUNITY): Admission: RE | Admit: 2017-05-11 | Payer: Medicaid Other | Source: Ambulatory Visit | Admitting: Urology

## 2017-05-11 SURGERY — INSERTION, PENILE PROSTHESIS, INFLATABLE
Anesthesia: General

## 2017-05-15 ENCOUNTER — Ambulatory Visit (INDEPENDENT_AMBULATORY_CARE_PROVIDER_SITE_OTHER): Payer: Medicaid Other | Admitting: Pharmacist

## 2017-05-15 DIAGNOSIS — I513 Intracardiac thrombosis, not elsewhere classified: Secondary | ICD-10-CM | POA: Diagnosis not present

## 2017-05-15 DIAGNOSIS — Z7901 Long term (current) use of anticoagulants: Secondary | ICD-10-CM | POA: Diagnosis not present

## 2017-05-15 LAB — POCT INR: INR: 2.4

## 2017-05-22 ENCOUNTER — Ambulatory Visit: Payer: Medicaid Other | Admitting: Cardiovascular Disease

## 2017-05-24 ENCOUNTER — Other Ambulatory Visit: Payer: Self-pay | Admitting: Cardiovascular Disease

## 2017-06-07 ENCOUNTER — Telehealth: Payer: Self-pay | Admitting: Cardiovascular Disease

## 2017-06-07 NOTE — Telephone Encounter (Signed)
°  New Prob  Pt calling to confirm he is okay to shovel the snow. Please call.

## 2017-06-07 NOTE — Telephone Encounter (Signed)
Left message to call back  

## 2017-06-08 NOTE — Telephone Encounter (Signed)
Returned call to patient, advised he would be okay to shovel snow but he is aware to be extremely cautious as he is on coumadin (fall risk).   Patient verbalized understanding.

## 2017-06-08 NOTE — Telephone Encounter (Signed)
Mr. Duesing is returning a call

## 2017-06-21 ENCOUNTER — Ambulatory Visit (INDEPENDENT_AMBULATORY_CARE_PROVIDER_SITE_OTHER): Payer: Medicaid Other | Admitting: Pharmacist

## 2017-06-21 DIAGNOSIS — Z7901 Long term (current) use of anticoagulants: Secondary | ICD-10-CM

## 2017-06-21 DIAGNOSIS — I513 Intracardiac thrombosis, not elsewhere classified: Secondary | ICD-10-CM

## 2017-06-21 LAB — POCT INR: INR: 2.3

## 2017-06-28 ENCOUNTER — Telehealth: Payer: Self-pay | Admitting: Cardiovascular Disease

## 2017-06-28 MED ORDER — FUROSEMIDE 40 MG PO TABS: 40.0000 mg | ORAL_TABLET | Freq: Every day | ORAL | 2 refills | Status: DC

## 2017-06-28 NOTE — Telephone Encounter (Signed)
°*  STAT* If patient is at the pharmacy, call can be transferred to refill team.   1. Which medications need to be refilled? (please list name of each medication and dose if known) furosemide (LASIX) 40 MG tablet  2. Which pharmacy/location (including street and city if local pharmacy) is medication to be sent to? [facility] on [address]  3. Do they need a 30 day or 90 day supply? 30 day

## 2017-07-02 ENCOUNTER — Other Ambulatory Visit: Payer: Self-pay | Admitting: Cardiovascular Disease

## 2017-07-18 ENCOUNTER — Telehealth: Payer: Self-pay | Admitting: Cardiovascular Disease

## 2017-07-18 MED ORDER — WARFARIN SODIUM 5 MG PO TABS: ORAL_TABLET | ORAL | 1 refills | Status: DC

## 2017-07-18 NOTE — Telephone Encounter (Signed)
°*  STAT* If patient is at the pharmacy, call can be transferred to refill team.   1. Which medications need to be refilled? (please list name of each medication and dose if known) warfarin (COUMADIN) 5 MG tablet 2. Which pharmacy/location (including street and city if local pharmacy) is medication to be sent to? [facility] on [address] in [place] 3. Do they need a 30 day or 90 day supply? [place]

## 2017-07-18 NOTE — Telephone Encounter (Signed)
rx sent

## 2017-07-19 ENCOUNTER — Other Ambulatory Visit: Payer: Self-pay | Admitting: Pharmacist

## 2017-07-19 MED ORDER — WARFARIN SODIUM 5 MG PO TABS: ORAL_TABLET | ORAL | 1 refills | Status: DC

## 2017-08-03 ENCOUNTER — Ambulatory Visit (INDEPENDENT_AMBULATORY_CARE_PROVIDER_SITE_OTHER): Payer: Medicaid Other | Admitting: Pharmacist Clinician (PhC)/ Clinical Pharmacy Specialist

## 2017-08-03 DIAGNOSIS — I513 Intracardiac thrombosis, not elsewhere classified: Secondary | ICD-10-CM

## 2017-08-03 DIAGNOSIS — Z7901 Long term (current) use of anticoagulants: Secondary | ICD-10-CM

## 2017-08-03 LAB — POCT INR: INR: 1.9

## 2017-08-03 NOTE — Patient Instructions (Signed)
Description   Take 1.5 tablets today Friday Feb 8, then continue with 1 tablet each Monday, Wednesday and Friday, 1/2 tablet all other days.  Repeat INR in 6 weeks

## 2017-08-08 ENCOUNTER — Ambulatory Visit (INDEPENDENT_AMBULATORY_CARE_PROVIDER_SITE_OTHER): Payer: Medicaid Other | Admitting: *Deleted

## 2017-08-08 ENCOUNTER — Telehealth: Payer: Self-pay | Admitting: Cardiology

## 2017-08-08 DIAGNOSIS — I472 Ventricular tachycardia, unspecified: Secondary | ICD-10-CM

## 2017-08-08 NOTE — Telephone Encounter (Signed)
Spoke with pt and reminded pt of remote transmission that is due today. Pt verbalized understanding.   

## 2017-08-09 ENCOUNTER — Encounter: Payer: Self-pay | Admitting: Cardiology

## 2017-08-09 NOTE — Progress Notes (Signed)
Remote ICD transmission.   

## 2017-08-15 ENCOUNTER — Telehealth: Payer: Self-pay | Admitting: Cardiovascular Disease

## 2017-08-15 NOTE — Telephone Encounter (Signed)
New Message    *STAT* If patient is at the pharmacy, call can be transferred to refill team.   1. Which medications need to be refilled? (please list name of each medication and dose if known) atorvastatin (LIPITOR) 40 MG tablet  2. Which pharmacy/location (including street and city if local pharmacy) is medication to be sent to? [facility] [address]   3. Do they need a 30 day or 90 day supply? 30 day

## 2017-08-16 MED ORDER — ATORVASTATIN CALCIUM 40 MG PO TABS: 40.0000 mg | ORAL_TABLET | Freq: Every day | ORAL | 5 refills | Status: DC

## 2017-08-17 ENCOUNTER — Other Ambulatory Visit: Payer: Self-pay | Admitting: Cardiovascular Disease

## 2017-08-22 ENCOUNTER — Ambulatory Visit (INDEPENDENT_AMBULATORY_CARE_PROVIDER_SITE_OTHER): Payer: Medicaid Other | Admitting: Cardiovascular Disease

## 2017-08-22 ENCOUNTER — Encounter: Payer: Self-pay | Admitting: Cardiovascular Disease

## 2017-08-22 ENCOUNTER — Ambulatory Visit (INDEPENDENT_AMBULATORY_CARE_PROVIDER_SITE_OTHER): Payer: Medicaid Other | Admitting: Pharmacist

## 2017-08-22 VITALS — BP 91/60 | HR 66 | Ht 69.0 in | Wt 191.0 lb

## 2017-08-22 DIAGNOSIS — E785 Hyperlipidemia, unspecified: Secondary | ICD-10-CM | POA: Diagnosis not present

## 2017-08-22 DIAGNOSIS — I513 Intracardiac thrombosis, not elsewhere classified: Secondary | ICD-10-CM

## 2017-08-22 DIAGNOSIS — I236 Thrombosis of atrium, auricular appendage, and ventricle as current complications following acute myocardial infarction: Secondary | ICD-10-CM | POA: Diagnosis not present

## 2017-08-22 DIAGNOSIS — I25118 Atherosclerotic heart disease of native coronary artery with other forms of angina pectoris: Secondary | ICD-10-CM | POA: Diagnosis not present

## 2017-08-22 DIAGNOSIS — I472 Ventricular tachycardia, unspecified: Secondary | ICD-10-CM

## 2017-08-22 DIAGNOSIS — Z7901 Long term (current) use of anticoagulants: Secondary | ICD-10-CM

## 2017-08-22 DIAGNOSIS — Z79899 Other long term (current) drug therapy: Secondary | ICD-10-CM | POA: Diagnosis not present

## 2017-08-22 DIAGNOSIS — I5042 Chronic combined systolic (congestive) and diastolic (congestive) heart failure: Secondary | ICD-10-CM

## 2017-08-22 DIAGNOSIS — Z95810 Presence of automatic (implantable) cardiac defibrillator: Secondary | ICD-10-CM

## 2017-08-22 LAB — POCT INR: INR: 2

## 2017-08-22 NOTE — Progress Notes (Signed)
Patient ID: Steven [name], male   DOB: 07/17/1955, 62 y.o.   MRN: [id number]    Cardiology Office Note    Date:  08/22/2017   ID:  Feliberto, Stockley [birth year], MRN [id number]  PCP:  Lorelee Market, MD  Cardiologist:  Shelva Majestic, M.D.; Sanda Klein, MD   No chief complaint on file.   History of Present Illness:  Steven [name] is a 62 y.o. male with severe ischemic cardiomyopathy following an extensive infarction in the LAD artery territory complicated by severe left ventricular systolic dysfunction, left ventricular apical thrombus and ventricular tachycardia. He received a defibrillator for primary prevention in 2012 [employer]). He has never received device therapy to date, but his device has recorded fairly lengthy episodes of nonsustained ventricular tachycardia around the time when he suffered syncope and a subdural hematoma that required surgical evacuation.  He is doing well and is still working.  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  He is occasionally dizzy when he changes position after bending over.  He does not have dyspnea except if he tries to walk faster than usual.   Sometimes when he does that he has both dyspnea and some mild chest tightness.  The symptoms resolved very promptly with rest.  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  His pacemaker shows activity level is very stable over the last year at about 2 hours/day, with good heart rate histogram distribution.  He denies orthopnea, PND or leg edema.  He has not had palpitations, syncope or defibrillator discharges. He has lost a little more weight since I last saw him and since he last saw Dr. Claiborne Billings in September 2018.  Echo performed in October 2017 showed EF 20-25%, no evidence of fluid overload, no evidence of left ventricular thrombus.  He has a Medtronic protecta XT DR defibrillator: his device shows roughly 80 % atrial pacing and no ventricular pacing.  Battery voltage is 2.81 V (ERI 2.63 V).  Since his last device checkin the office, he has had 3 episodes of nonsustained ventricular tachycardia, that occurred in March, June and October respectively.  The longest one consisted of 11 beats.. There have been no episodes of sustained ventricular tachycardia or atrial fibrillation since his last device check. His heart rate is remarkably constant, his thoracic impedance has been in the normal range at least since last September.  Past Medical History:  Diagnosis Date  . Acute MI, anterior wall ([place])   . CAD (coronary artery disease)    2D ECHO, 07/13/2011 - EF <25%, LV moderatelty dilated, LA moderately dilatedLEXISCAN, 12/14/2011 - moderate-severe perfusion defect seen in the basal anteroseptal, mid anterior, apicacl anterior, apical, apical inferior, and apical lateral regions, post-stress EF 25%, new EKG changes from baseline abnormalities  . Inguinal hernia, left     Past Surgical History:  Procedure Laterality Date  . CARDIAC CATHETERIZATION  11/25/2010   Predilation balloon-Apex monorail 2x30mm, Cutting balloon-2.25x16mm, resulting in a reduction of 100% stenosis down to less than 10%  . CARDIAC CATHETERIZATION  07/12/2010   LAD stented with a 3.5x32mm bare-metal Veriflex stent resulting in a reduction of 100% lesion to 0%  . CARDIAC DEFIBRILLATOR PLACEMENT  03/02/2011   Medtronic Protecta XT DR, model [id number], serial [id number]  . PACEMAKER PLACEMENT  07/14/2010   Temporary placement of pacemaker, if rhythm issue continues will need a permanent device    Outpatient Medications Prior to Visit  Medication Sig Dispense Refill  .  albuterol (PROVENTIL HFA;VENTOLIN HFA) 108 (90 Base) MCG/ACT inhaler Inhale 1-2 puffs into the lungs every 6 (six) hours as needed for wheezing or shortness of breath.    [name] atorvastatin (LIPITOR) 40 MG tablet TAKE 1 TABLET BY MOUTH ONCE DAILY 90 tablet 2  . beclomethasone (QVAR) 80 MCG/ACT inhaler Inhale 1 puff into the  lungs 2 (two) times daily.    . carvedilol (COREG) 25 MG tablet TAKE ONE TABLET BY MOUTH TWICE DAILY WITH A MEAL 60 tablet 11  . clomiPHENE (CLOMID) 50 MG tablet Take 25 mg by mouth every other day.    . furosemide (LASIX) 40 MG tablet TAKE 1 TABLET BY MOUTH ONCE DAILY 30 tablet 2  . gabapentin (NEURONTIN) 300 MG capsule Take 300 mg by mouth at bedtime.    . isosorbide mononitrate (IMDUR) 30 MG 24 hr tablet TAKE 1 TABLET BY MOUTH ONCE DAILY 90 tablet 0  . lisinopril (PRINIVIL,ZESTRIL) 5 MG tablet TAKE ONE TABLET BY MOUTH ONCE DAILY 30 tablet 11  . Multiple Vitamin (MULTIVITAMIN) tablet Take 1 tablet by mouth daily.    [name] NITROSTAT 0.4 MG SL tablet DISSOLVE ONE TABLET UNDER THE TONGUE EVERY 5 MINUTES AS NEEDED FOR CHEST PAIN.  DO NOT EXCEED A TOTAL OF 3 DOSES IN 15 MINUTES 25 tablet 1  . spironolactone (ALDACTONE) 25 MG tablet TAKE ONE-HALF TABLET BY MOUTH TWICE DAILY 90 tablet 2  . tamsulosin (FLOMAX) 0.4 MG CAPS capsule Take 0.4 mg by mouth daily.    [name] tiotropium (SPIRIVA) 18 MCG inhalation capsule Place 18 mcg into inhaler and inhale daily.    . traZODone (DESYREL) 50 MG tablet Take 50 mg by mouth at bedtime.    [name] warfarin (COUMADIN) 5 MG tablet Take 1/2 to 1 tablet daily as directed by coumadin clinic 90 tablet 1   No facility-administered medications prior to visit.      Allergies:   Patient has no known allergies.   Social History   Socioeconomic History  . Marital status: Married    Spouse name: None  . Number of children: None  . Years of education: None  . Highest education level: None  Social Needs  . Financial resource strain: None  . Food insecurity - worry: None  . Food insecurity - inability: None  . Transportation needs - medical: None  . Transportation needs - non-medical: None  Occupational History  . None  Tobacco Use  . Smoking status: Former Smoker    Types: Cigarettes, Cigars  . Smokeless tobacco: Never Used  Substance and Sexual Activity  . Alcohol use:  Yes    Alcohol/week: 1.0 - 1.5 oz    Types: 2 - 3 drink(s) per week  . Drug use: No  . Sexual activity: None  Other Topics Concern  . None  Social History Narrative  . None     ROS:   Please see the history of present illness.    ROS All other systems reviewed and are negative.   PHYSICAL EXAM:   VS:  BP 91/60   Pulse 66   Ht 5\' 9"  (1.753 m)   Wt 191 lb (86.6 kg)   BMI 28.21 kg/m     General: Alert, oriented x3, no distress, healthy left subclavian defibrillator site Head: no evidence of trauma, PERRL, EOMI, no exophtalmos or lid lag, no myxedema, no xanthelasma; normal ears, nose and oropharynx Neck: normal jugular venous pulsations and no hepatojugular reflux; brisk carotid pulses without delay and no carotid bruits Chest: clear  to auscultation, no signs of consolidation by percussion or palpation, normal fremitus, symmetrical and full respiratory excursions Cardiovascular: normal position and quality of the apical impulse, regular rhythm, normal first and second heart sounds, no murmurs, rubs or gallops Abdomen: no tenderness or distention, no masses by palpation, no abnormal pulsatility or arterial bruits, normal bowel sounds, no hepatosplenomegaly Extremities: no clubbing, cyanosis or edema; 2+ radial, ulnar and brachial pulses bilaterally; 2+ right femoral, posterior tibial and dorsalis pedis pulses; 2+ left femoral, posterior tibial and dorsalis pedis pulses; no subclavian or femoral bruits Neurological: grossly nonfocal Psych: Normal mood and affect   Wt Readings from Last 3 Encounters:  08/22/17 191 lb (86.6 kg)  03/20/17 193 lb 6.4 oz (87.7 kg)  08/09/16 200 lb (90.7 kg)      Studies/Labs Reviewed:   EKG:  EKG is ordered today.  It shows atrial paced, ventricular sensed rhythm with left anterior fascicular block and left axis deviation.  The QTC is 415 ms.  Lipid Panel    Component Value Date/Time   CHOL 115 09/03/2014 0803   CHOL 161 07/01/2013 0838    TRIG 80 09/03/2014 0803   TRIG 108 07/01/2013 0838   HDL 35 (L) 09/03/2014 0803   HDL 43 07/01/2013 0838   CHOLHDL 3.3 09/03/2014 0803   VLDL 16 09/03/2014 0803   LDLCALC 64 09/03/2014 0803   LDLCALC 96 07/01/2013 0838    ASSESSMENT:    1. Chronic combined systolic and diastolic heart failure ([place])   2. VT (ventricular tachycardia) ([place])   3. ICD (implantable cardioverter-defibrillator) in place   4. Coronary artery disease of native artery of native heart with stable angina pectoris ([place])   5. Left ventricular apical thrombus following MI ([place])   6. Hyperlipidemia LDL goal <70   7. Medication management      PLAN:  In order of problems listed above:  1. CHF: He continues to slowly lose weight and we have to keep reassessing his "dry weight".  His blood pressure is a little low and he has episodes of orthostatic dizziness.  We will stop his long-acting nitrate since it likely has the least value in management of his heart failure. Would like to keep on as much beta blockers possible in view of his previous problems with VT. Severely depressed left ventricular systolic function most recent EF  20-25 and echo performed in October 2018 showed an ejection fraction of 20-25% %. 2. VT: Rare episodes of nonsustained ventricular tachycardia only 3 in the last 12 months.  In the past he had symptomatic nonsustained VT that may have led to his fall and head injury. 3. ICD: Normal device function, continue remote downloads every 3 months via [employer]. 4. CAD: He has occasional exertional angina but this is very infrequent.  I asked him to call back if this worsens after he stops the long-acting nitrate. 5. LV thrombus: Not seen on most recent echo, remains on warfarin anticoagulation.  At this point it may be a good idea to discontinue his warfarin.  It has been well over a year since he had documented thrombus and much longer since his myocardial infarction.  If we do stop his warfarin he should  go back on aspirin.  Will discuss with Dr. Claiborne Billings. 6. HLP: He is due a lipid profile.  On statin.   Medication Adjustments/Labs and Tests Ordered: Current medicines are reviewed at length with the patient today.  Concerns regarding medicines are outlined above.  Medication changes, Labs and  Tests ordered today are listed in the Patient Instructions below. Patient Instructions  Dr Sallyanne Kuster recommends that you continue on your current medications as directed. Please refer to the Current Medication list given to you today.  Your physician recommends that you return for lab work at your convenience - FASTING.  Remote monitoring is used to monitor your Pacemaker or ICD from home. This monitoring reduces the number of office visits required to check your device to one time per year. It allows [place] to keep an eye on the functioning of your device to ensure it is working properly. You are scheduled for a device check from home on Wednesday, May 15th, 2019. You may send your transmission at any time that day. If you have a wireless device, the transmission will be sent automatically. After your physician reviews your transmission, you will receive a notification with your next transmission date.  Dr Sallyanne Kuster recommends that you schedule a follow-up appointment in 12 months with an ICD check. You will receive a reminder letter in the mail two months in advance. If you don't receive a letter, please call our office to schedule the follow-up appointment.  If you need a refill on your cardiac medications before your next appointment, please call your pharmacy.    Contact Med[employer] Monitor Support at [phone] for help troubleshooting your monitor.      Signed, Sanda Klein, MD  08/22/2017 4:04 PM    [facility] [place], [place], [place]  [address] Phone: [phone]; Fax: [phone]

## 2017-08-22 NOTE — Patient Instructions (Signed)
Dr Sallyanne Kuster recommends that you continue on your current medications as directed. Please refer to the Current Medication list given to you today.  Your physician recommends that you return for lab work at your convenience - FASTING.  Remote monitoring is used to monitor your Pacemaker or ICD from home. This monitoring reduces the number of office visits required to check your device to one time per year. It allows [place] to keep an eye on the functioning of your device to ensure it is working properly. You are scheduled for a device check from home on Wednesday, May 15th, 2019. You may send your transmission at any time that day. If you have a wireless device, the transmission will be sent automatically. After your physician reviews your transmission, you will receive a notification with your next transmission date.  Dr Sallyanne Kuster recommends that you schedule a follow-up appointment in 12 months with an ICD check. You will receive a reminder letter in the mail two months in advance. If you don't receive a letter, please call our office to schedule the follow-up appointment.  If you need a refill on your cardiac medications before your next appointment, please call your pharmacy.    Contact [employer] Monitor Support at [phone] for help troubleshooting your monitor.

## 2017-08-23 ENCOUNTER — Encounter: Payer: Self-pay | Admitting: Cardiovascular Disease

## 2017-08-24 LAB — CUP PACEART REMOTE DEVICE CHECK
Brady Statistic AP VP Percent: 0.03 %
Brady Statistic AS VP Percent: 0.01 %
Brady Statistic RA Percent Paced: 80.82 %
Brady Statistic RV Percent Paced: 0.03 %
Date Time Interrogation Session: 20190214192043
HIGH POWER IMPEDANCE MEASURED VALUE: 380 Ohm
HIGH POWER IMPEDANCE MEASURED VALUE: 46 Ohm
HighPow Impedance: 64 Ohm
Implantable Lead Location: 753859
Implantable Lead Model: 185
Implantable Lead Model: 5076
Implantable Lead Serial Number: 358872
Lead Channel Impedance Value: 399 Ohm
Lead Channel Pacing Threshold Amplitude: 0.625 V
Lead Channel Sensing Intrinsic Amplitude: 1.625 mV
Lead Channel Sensing Intrinsic Amplitude: 7.375 mV
Lead Channel Sensing Intrinsic Amplitude: 7.375 mV
Lead Channel Setting Pacing Amplitude: 1.5 V
Lead Channel Setting Pacing Pulse Width: 0.4 ms
Lead Channel Setting Sensing Sensitivity: 0.3 mV
MDC IDC LEAD IMPLANT DT: 20120906
MDC IDC LEAD IMPLANT DT: 20120906
MDC IDC LEAD LOCATION: 753860
MDC IDC MSMT BATTERY VOLTAGE: 2.81 V
MDC IDC MSMT LEADCHNL RA IMPEDANCE VALUE: 399 Ohm
MDC IDC MSMT LEADCHNL RA PACING THRESHOLD AMPLITUDE: 0.5 V
MDC IDC MSMT LEADCHNL RA PACING THRESHOLD PULSEWIDTH: 0.4 ms
MDC IDC MSMT LEADCHNL RA SENSING INTR AMPL: 1.625 mV
MDC IDC MSMT LEADCHNL RV PACING THRESHOLD PULSEWIDTH: 0.4 ms
MDC IDC PG IMPLANT DT: 20120906
MDC IDC SET LEADCHNL RV PACING AMPLITUDE: 2 V
MDC IDC STAT BRADY AP VS PERCENT: 81.25 %
MDC IDC STAT BRADY AS VS PERCENT: 18.72 %

## 2017-08-29 LAB — LIPID PANEL
CHOL/HDL RATIO: 3.1 ratio (ref 0.0–5.0)
CHOLESTEROL TOTAL: 137 mg/dL (ref 100–199)
HDL: 44 mg/dL (ref >39)
LDL CALC: 74 mg/dL (ref 0–99)
TRIGLYCERIDES: 96 mg/dL (ref 0–149)
VLDL CHOLESTEROL CAL: 19 mg/dL (ref 5–40)

## 2017-08-29 LAB — COMPREHENSIVE METABOLIC PANEL
A/G RATIO: 1.3 (ref 1.2–2.2)
ALK PHOS: 55 IU/L (ref 39–117)
ALT: 12 IU/L (ref 0–44)
AST: 25 IU/L (ref 0–40)
Albumin: 4.3 g/dL (ref 3.6–4.8)
BUN/Creatinine Ratio: 12 (ref 10–24)
BUN: 21 mg/dL (ref 8–27)
Bilirubin Total: 0.5 mg/dL (ref 0.0–1.2)
CO2: 20 mmol/L (ref 20–29)
Calcium: 9.4 mg/dL (ref 8.6–10.2)
Chloride: 98 mmol/L (ref 96–106)
Creatinine, Ser: 1.73 mg/dL — ABNORMAL HIGH (ref 0.76–1.27)
GFR calc Af Amer: 48 mL/min/{1.73_m2} — ABNORMAL LOW (ref >59)
GFR calc non Af Amer: 41 mL/min/{1.73_m2} — ABNORMAL LOW (ref >59)
GLOBULIN, TOTAL: 3.4 g/dL (ref 1.5–4.5)
Glucose: 99 mg/dL (ref 65–99)
POTASSIUM: 4.6 mmol/L (ref 3.5–5.2)
SODIUM: 133 mmol/L — AB (ref 134–144)
Total Protein: 7.7 g/dL (ref 6.0–8.5)

## 2017-09-03 ENCOUNTER — Telehealth: Payer: Self-pay | Admitting: Cardiovascular Disease

## 2017-09-03 NOTE — Telephone Encounter (Signed)
Returned the call to the patient. He has been made aware of his results and verbalized his understanding.   Notes recorded by Sanda Klein, MD on 08/29/2017 at 4:49 PM EST Labs are OK: renal parameters abnormal, but not far from his baseline.

## 2017-09-03 NOTE — Telephone Encounter (Signed)
Pt rtn call to [place] regarding lab work-pls call

## 2017-09-05 LAB — CUP PACEART INCLINIC DEVICE CHECK
Implantable Lead Location: 753859
Implantable Lead Model: 5076
Implantable Lead Serial Number: 358872
Lead Channel Setting Pacing Amplitude: 2 V
Lead Channel Setting Pacing Pulse Width: 0.4 ms
Lead Channel Setting Sensing Sensitivity: 0.3 mV
MDC IDC LEAD IMPLANT DT: 20120906
MDC IDC LEAD IMPLANT DT: 20120906
MDC IDC LEAD LOCATION: 753860
MDC IDC PG IMPLANT DT: 20120906
MDC IDC SESS DTM: 20190313133731
MDC IDC SET LEADCHNL RA PACING AMPLITUDE: 1.5 V

## 2017-09-24 ENCOUNTER — Other Ambulatory Visit: Payer: Self-pay | Admitting: Cardiovascular Disease

## 2017-09-26 ENCOUNTER — Encounter: Payer: Self-pay | Admitting: Cardiovascular Disease

## 2017-09-26 ENCOUNTER — Ambulatory Visit: Payer: Medicaid Other | Admitting: Cardiovascular Disease

## 2017-09-26 ENCOUNTER — Ambulatory Visit (INDEPENDENT_AMBULATORY_CARE_PROVIDER_SITE_OTHER): Payer: Medicaid Other | Admitting: Pharmacist Clinician (PhC)/ Clinical Pharmacy Specialist

## 2017-09-26 ENCOUNTER — Other Ambulatory Visit: Payer: Self-pay | Admitting: Cardiovascular Disease

## 2017-09-26 VITALS — BP 102/70 | HR 66 | Ht 69.0 in | Wt 191.0 lb

## 2017-09-26 DIAGNOSIS — I236 Thrombosis of atrium, auricular appendage, and ventricle as current complications following acute myocardial infarction: Secondary | ICD-10-CM | POA: Diagnosis not present

## 2017-09-26 DIAGNOSIS — Z7901 Long term (current) use of anticoagulants: Secondary | ICD-10-CM | POA: Diagnosis not present

## 2017-09-26 DIAGNOSIS — I255 Ischemic cardiomyopathy: Secondary | ICD-10-CM

## 2017-09-26 DIAGNOSIS — I513 Intracardiac thrombosis, not elsewhere classified: Secondary | ICD-10-CM | POA: Diagnosis not present

## 2017-09-26 DIAGNOSIS — Z95810 Presence of automatic (implantable) cardiac defibrillator: Secondary | ICD-10-CM

## 2017-09-26 DIAGNOSIS — E785 Hyperlipidemia, unspecified: Secondary | ICD-10-CM

## 2017-09-26 DIAGNOSIS — I25118 Atherosclerotic heart disease of native coronary artery with other forms of angina pectoris: Secondary | ICD-10-CM | POA: Diagnosis not present

## 2017-09-26 LAB — POCT INR: INR: 1.7

## 2017-09-26 MED ORDER — FUROSEMIDE 40 MG PO TABS: 20.0000 mg | ORAL_TABLET | Freq: Every day | ORAL | 8 refills | Status: DC

## 2017-09-26 NOTE — Patient Instructions (Signed)
Medication Instructions:  DECREASE furosemide (Lasix) to 20 mg daily  Follow-Up: Your physician wants you to follow-up in: 6 months with Dr. Claiborne Billings. You will receive a reminder letter in the mail two months in advance. If you don't receive a letter, please call our office to schedule the follow-up appointment.   Any Other Special Instructions Will Be Listed Below (If Applicable).     If you need a refill on your cardiac medications before your next appointment, please call your pharmacy.

## 2017-09-28 ENCOUNTER — Encounter: Payer: Self-pay | Admitting: Cardiovascular Disease

## 2017-09-28 NOTE — Progress Notes (Signed)
Patient ID: Jaion P Mezo, male   DOB: 03/15/1956, 61 y.o.   MRN: [id number]   HPI: Tyrone P Bentivegna is a 61 y.o. male who presents to the office today for a 7 month follow-up cardiology evaluation.  Mr Sargeant is originally from [place] and suffered a large out of hospital anterior wall myocardial infarction in January 2012. .  He presented after a three-hour delay to the hospital and was taken emergently to the cardiac catheterization laboratory where I performed successful emergent intervention with the door to balloon time of only 25 minutes. His LAD was successfully recanalized. CPK increased to 8012 with an MB of 716 and troponin was greater than 100 initially. He subsequently wore a life vest and ultimately after several months without significant improvement of LV function he underwent implantable cardio defibrillator insertion.  Interrogation of his defibrillator in the past had shown episodes of nonsustained VT and his dose of carvedilol has been further titrated.  A followup echo Doppler study on 08/04/2013 showed an ejection fraction of 20-25% with diffuse hypokinesis but there was akinesis of the entire anteroseptal myocardium and apical region. There now appear to be an apparent medium-sized mural apical thrombus which was not noted on his last echo. He denies paresthesias. He denies episodes of chest pain. He denies shortness of breath.At his last office visit in February in light of his moderate sized mural thrombus, Plavix, was discontinued, and he was started on Coumadin therapy.  In August 2015 he tripped and hit his head on a bar recently developed severe headache, transient leg weakness, which evolves into slurred speech.  He underwent evaluation 1-1/2 weeks later and was found  to have a subdural hematoma.  He underwent a right burr hole subdural hematoma evacuation on 02/05/2014 by Dr. Freund at [facility].  Initially aspirin and Coumadin were on hold.  He had a follow-up CT  scan and ultimately was given clearance to reinstitute Coumadin anticoagulation in light of his severe cardiomyopathy with an ejection fraction of 15-20% on echo on 03/13/2014 and medium-sized apical mural thrombus which had been documented on a previous echo.  This ultimately has stabilized and he has been back on warfarin anticoagulation.    Presently, he denies any chest pain.  He denies shortness of breath.  He's unaware of any recurrent palpitations.  His ICD was  last interrogated one month ago by Dr. C.  He was noted to have very short nonsustained bursts of nonsustained VT with one episode lasting 8 beats at 212 bpm on 07/09/2014.  His device was adjusted so as it would intervene more quickly and avoid syncope.    I last saw him in September 2018 and prior to that evaluation had not seen him since 2016.  Over this time period, he has been without recurrent chest pain.  At times he has had some occasional dizziness and weakness.  He saw Dr. Croitoru on August 09 2016.  His device was interrogated and showed 72% atrial pacing and no ventricular pacing.  There was one 8 beat episode of nonsustained VT since his last device check.  His last echo Doppler study was in September 2015.  He wasevaluated by Dr. Eskridge for insertion of a penile implantation.  Preoperative clearance was given by Dr. Croitoru was advised that he hold his warfarin and he will stop his aspirin permanently.  He was  seen at [facility] and was felt to have stable stage III chronic kidney disease.  His   blood pressure was controlled and he was euvolemic.   Since I last saw him in September 2018 he underwent an echo Doppler study on April 18, 2017.  This continued to show reduced ejection fraction at 20-25%.  He had significant wall motion abnormality with akinesis of the apical anterior septum, inferolateral wall, and the entire apex.  There was grade 1 diastolic dysfunction.  He was seen by Dr. Sallyanne Kuster in February  2019.  At that time, his ICD had normal device function.  He has been undergoing remote downloads every 3 months via [facility].  He has not experienced any episodes of recent angina.  He was found to have rare episodes of nonsustained VT only 3 in the last 12 months.  At times he notes some mild dizziness if he stands up very fast.  He presents for evaluation.  Past Medical History:  Diagnosis Date  . Acute MI, anterior wall ([place])   . CAD (coronary artery disease)    2D ECHO, 07/13/2011 - EF <25%, LV moderatelty dilated, LA moderately dilatedLEXISCAN, 12/14/2011 - moderate-severe perfusion defect seen in the basal anteroseptal, mid anterior, apicacl anterior, apical, apical inferior, and apical lateral regions, post-stress EF 25%, new EKG changes from baseline abnormalities  . Inguinal hernia, left     Past Surgical History:  Procedure Laterality Date  . CARDIAC CATHETERIZATION  11/25/2010   Predilation balloon-Apex monorail 2x28m, Cutting balloon-2.25x145m resulting in a reduction of 100% stenosis down to less than 10%  . CARDIAC CATHETERIZATION  07/12/2010   LAD stented with a [id number]-metal Veriflex stent resulting in a reduction of 100% lesion to 0%  . CARDIAC DEFIBRILLATOR PLACEMENT  03/02/2011   Medtronic Protecta XT DR, model #[id number] #[id number] . PACEMAKER PLACEMENT  07/14/2010   Temporary placement of pacemaker, if rhythm issue continues will need a permanent device    No Known Allergies  Current Outpatient Medications  Medication Sig Dispense Refill  . albuterol (PROVENTIL HFA;VENTOLIN HFA) 108 (90 Base) MCG/ACT inhaler Inhale 1-2 puffs into the lungs every 6 (six) hours as needed for wheezing or shortness of breath.    . aMarland Kitchenorvastatin (LIPITOR) 40 MG tablet TAKE 1 TABLET BY MOUTH ONCE DAILY 90 tablet 2  . beclomethasone (QVAR) 80 MCG/ACT inhaler Inhale 1 puff into the lungs 2 (two) times daily.    . carvedilol (COREG) 25 MG tablet TAKE ONE TABLET BY MOUTH TWICE DAILY  WITH A MEAL 60 tablet 11  . clomiPHENE (CLOMID) 50 MG tablet Take 25 mg by mouth every other day.    . furosemide (LASIX) 40 MG tablet Take 0.5 tablets (20 mg total) by mouth daily. 30 tablet 8  . gabapentin (NEURONTIN) 300 MG capsule Take 300 mg by mouth at bedtime.    . Multiple Vitamin (MULTIVITAMIN) tablet Take 1 tablet by mouth daily.    . NMarland KitchenTROSTAT 0.4 MG SL tablet DISSOLVE ONE TABLET UNDER THE TONGUE EVERY 5 MINUTES AS NEEDED FOR CHEST PAIN.  DO NOT EXCEED A TOTAL OF 3 DOSES IN 15 MINUTES 25 tablet 1  . spironolactone (ALDACTONE) 25 MG tablet TAKE ONE-HALF TABLET BY MOUTH TWICE DAILY 90 tablet 2  . tamsulosin (FLOMAX) 0.4 MG CAPS capsule Take 0.4 mg by mouth daily.    . tMarland Kitchenotropium (SPIRIVA) 18 MCG inhalation capsule Place 18 mcg into inhaler and inhale daily.    . traZODone (DESYREL) 50 MG tablet Take 50 mg by mouth at bedtime.    . wMarland Kitchenrfarin (COUMADIN) 5 MG tablet Take 1/2 to 1 tablet  daily as directed by coumadin clinic 90 tablet 1  . lisinopril (PRINIVIL,ZESTRIL) 5 MG tablet TAKE 1 TABLET BY MOUTH ONCE DAILY 30 tablet 11   No current facility-administered medications for this visit.    Social he is single. He has one child. He was born in [place]. There is a remote tobacco history. He does drink occasional alcohol.   ROS General: Negative; No fevers, chills, or night sweats;  HEENT: Negative; No changes in vision or hearing, sinus congestion, difficulty swallowing Pulmonary: Negative; No cough, wheezing, shortness of breath, hemoptysis Cardiovascular: his previous dizziness has improved; No chest pain, presyncope, syncope, palpitations GI: Negative; No nausea, vomiting, diarrhea, or abdominal pain GU: Positive for erectile dysfunction.; No dysuria, hematuria, or difficulty voiding Musculoskeletal: Positive for history of an inguinal hernia; no myalgias, joint pain, or weakness Hematologic/Oncology: Negative; no easy bruising, bleeding Endocrine: Negative; no heat/cold intolerance;  no diabetes Neuro: Status post recent subdural hematoma evacuation Skin: Negative; No rashes or skin lesions Psychiatric: Negative; No behavioral problems, depression Sleep: Negative; No snoring, daytime sleepiness, hypersomnolence, bruxism, restless legs, hypnogognic hallucinations, no cataplexy Other comprehensive 14 point system review is negative.  PE BP 102/70   Pulse 66   Ht 5' 9" (1.753 m)   Wt 191 lb (86.6 kg)   BMI 28.21 kg/m    Repeat blood pressure by me was 100/70 supine and 98/70 standing.  Wt Readings from Last 3 Encounters:  09/26/17 191 lb (86.6 kg)  08/22/17 191 lb (86.6 kg)  03/20/17 193 lb 6.4 oz (87.7 kg)   General: Alert, oriented, no distress.  Skin: normal turgor, no rashes, warm and dry HEENT: Normocephalic, atraumatic. Pupils equal round and reactive to light; sclera anicteric; extraocular muscles intact;  Nose without nasal septal hypertrophy Mouth/Parynx benign; Mallinpatti scale 3 Neck: No JVD, no carotid bruits; normal carotid upstroke Lungs: clear to ausculatation and percussion; no wheezing or rales Chest wall: without tenderness to palpitation Heart: PMI not displaced, RRR, s1 s2 normal, 1/6 systolic murmur, no diastolic murmur, no rubs, gallops, thrills, or heaves Abdomen: soft, nontender; no hepatosplenomehaly, BS+; abdominal aorta nontender and not dilated by palpation. Back: no CVA tenderness Pulses 2+ Musculoskeletal: full range of motion, normal strength, no joint deformities Extremities: no clubbing cyanosis or edema, Homan's sign negative  Neurologic: grossly nonfocal; Cranial nerves grossly wnl Psychologic: Normal mood and affect   ECG (independently read by me): Atrially paced rhythm at 72 bpm.  One PVC.  Poor progression consistent with old anterior infarction.  Anterolateral ST-T changes  September 2016 ECG (independently read by me): Atrially paced rhythm at 64 bpm.  No ectopy.  Poor anterior R-wave progression concordant with old  anterior MI.  March 2016 ECG (independently read by me): Atrially paced rhythm at 74 bpm.  Increased PR interval at 212 ms.  ST-T changes anterolaterally.  November 2015 ECG (independently read by me): Atrially paced rhythm.  Old anterior wall MI.  Per Ms. Lee noted ST-T changes laterally  Prior September 2015 ECG (independently read by me): Sinus rhythm at 63 beats per minute.  Old anterior wall myocardial infarction, poor R. progression V1 through V4 and T-wave changes V3 through V6, leads 1, and L.    08/18/2013 ECG (independently read by me):  Atrially paced at 64 beats per minute. QTc interval 427 ms. QRS duration 98 ms. Diffuse anterolateral T-wave changes secondary to his prior MI   Prior ECG of 02/27/2013: Atrial  paced rhythm at 64 beats per minute. Evidence for his prior  anterolateral wall myocardial infarction with diffuse T-wave abnormality and precordial Q waves V1 through V5  LABS:  BMP Latest Ref Rng & Units 08/29/2017 09/03/2014 07/01/2013  Glucose 65 - 99 mg/dL 99 84 110(H)  BUN 8 - 27 mg/dL 21 15 19  Creatinine 0.76 - 1.27 mg/dL 1.73(H) 1.44(H) 1.63(H)  BUN/Creat Ratio 10 - 24 12 - -  Sodium 134 - 144 mmol/L 133(L) 140 138  Potassium 3.5 - 5.2 mmol/L 4.6 4.4 4.6  Chloride 96 - 106 mmol/L 98 106 101  CO2 20 - 29 mmol/L 20 25 27  Calcium 8.6 - 10.2 mg/dL 9.4 8.9 9.3    Hepatic Function Latest Ref Rng & Units 08/29/2017 09/03/2014 07/01/2013  Total Protein 6.0 - 8.5 g/dL 7.7 6.8 7.6  Albumin 3.6 - 4.8 g/dL 4.3 4.1 4.2  AST 0 - 40 IU/L 25 25 23  ALT 0 - 44 IU/L 12 17 11  Alk Phosphatase 39 - 117 IU/L 55 50 55  Total Bilirubin 0.0 - 1.2 mg/dL 0.5 0.5 0.7  Bilirubin, Direct 0.0 - 0.3 mg/dL - - -    CBC Latest Ref Rng & Units 09/03/2014 07/01/2013 12/13/2012  WBC 4.0 - 10.5 K/uL 6.2 7.7 5.8  Hemoglobin 13.0 - 17.0 g/dL 15.9 15.6 16.0  Hematocrit 39.0 - 52.0 % 46.7 44.5 45.1  Platelets 150 - 400 K/uL 165 191 147(L)   Lab Results  Component Value Date   MCV 93.4 09/03/2014    MCV 90.4 07/01/2013   MCV 90.4 12/13/2012   Lab Results  Component Value Date   TSH 1.500 09/03/2014   Lab Results  Component Value Date   HGBA1C (H) 11/23/2010    5.8 (NOTE)                                                                       According to the ADA Clinical Practice Recommendations for 2011, when HbA1c is used as a screening test:   >=6.5%   Diagnostic of Diabetes Mellitus           (if abnormal result  is confirmed)  5.7-6.4%   Increased risk of developing Diabetes Mellitus  References:Diagnosis and Classification of Diabetes Mellitus,Diabetes Care,2011,34(Suppl 1):S62-S69 and Standards of Medical Care in         Diabetes - 2011,Diabetes Care,2011,34  (Suppl 1):S11-S61.  ipid Panel     Component Value Date/Time   CHOL 137 08/29/2017 0904   CHOL 161 07/01/2013 0838   TRIG 96 08/29/2017 0904   TRIG 108 07/01/2013 0838   HDL 44 08/29/2017 0904   HDL 43 07/01/2013 0838   CHOLHDL 3.1 08/29/2017 0904   CHOLHDL 3.3 09/03/2014 0803   VLDL 16 09/03/2014 0803   LDLCALC 74 08/29/2017 0904   LDLCALC 96 07/01/2013 0838    INR today 2.1  RADIOLOGY: No results found.  IMPRESSION: 1. Coronary artery disease of native artery of native heart with stable angina pectoris (HCC)   2. 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 Ischemic cardiomyopathy   4. ICD (implantable cardioverter-defibrillator) in place   5. Long term current use of anticoagulant therapy   6. Hyperlipidemia LDL goal <70     ASSESSMENT AND PLAN: Mr. Edman is a 61 year-old   African male who is s/p a large anterior wall myocardial infarction suffered in January 2012.. A subsequent  cardiac catheterization  on June 2012 showed a patent stent in the LAD but he had an occluded marginal branch of his LCX with collaterals. There also was 80% first diagonal stenosis in a small vessel and 40% RCA stenosis. He has ischemic cardiomyopathy and his echo in 2015 revealed an ejection fraction of 15-20%.   There was akinesis of his apical myocardium, but on this echo there was no mention of any of the previously noted medium-size apical thrombus.  He had been on Coumadin therapy and aspirin.  These had been held when he suffered tripped and fell resulting in subdural hematoma that required surgical evacuation in August 2015.  I reviewed his most recent echo Doppler with him in detail.  This shows an EF of 20-25%.  He continues to have an extensive wall motion abnormality with akinesis of his apex as well as antero-septal, inferolateral walls.  There was grade 1 diastolic dysfunction.  I discussed the possibility of discontinuance of Coumadin with him today since he has not been demonstrated to have any recurrent mural thrombus.  However, he continues to have an extensive wall motion abnormality which may predispose him to future thrombus.  We discussed the risk benefits of discontinuance of Coumadin and changing to baby aspirin alone.  He was very hesitant to use stopped the Coumadin and feels that he tripped leading to his fall resulting in his prior hematoma.  He is unaware of any recurrent arrhythmia.  His blood pressure is low.  He appears well compensated.  I am decreasing furosemide from 40 mg down to 20 mg daily.  He will monitor his blood pressure.  If blood pressure continues to be low further dose adjustment will be necessary.  He continues to be on atorvastatin 40 mg daily with target LDL less than 70.  I will see him in several months for reevaluation.    Troy Sine, MD, [facility]  09/28/2017 6:43 PM

## 2017-10-01 NOTE — Addendum Note (Signed)
Addended by: Jacqulynn Cadet on: 10/01/2017 11:23 AM   Modules accepted: Orders

## 2017-10-22 ENCOUNTER — Other Ambulatory Visit: Payer: Self-pay | Admitting: Cardiovascular Disease

## 2017-10-24 ENCOUNTER — Ambulatory Visit (INDEPENDENT_AMBULATORY_CARE_PROVIDER_SITE_OTHER): Payer: Medicaid Other | Admitting: Pharmacist

## 2017-10-24 DIAGNOSIS — Z7901 Long term (current) use of anticoagulants: Secondary | ICD-10-CM | POA: Diagnosis not present

## 2017-10-24 DIAGNOSIS — I513 Intracardiac thrombosis, not elsewhere classified: Secondary | ICD-10-CM | POA: Diagnosis not present

## 2017-10-24 LAB — POCT INR: INR: 2

## 2017-11-07 ENCOUNTER — Ambulatory Visit (INDEPENDENT_AMBULATORY_CARE_PROVIDER_SITE_OTHER): Payer: Medicaid Other | Admitting: *Deleted

## 2017-11-07 ENCOUNTER — Telehealth: Payer: Self-pay | Admitting: Cardiology

## 2017-11-07 DIAGNOSIS — I255 Ischemic cardiomyopathy: Secondary | ICD-10-CM | POA: Diagnosis not present

## 2017-11-07 NOTE — Telephone Encounter (Signed)
Spoke with pt and reminded pt of remote transmission that is due today. Pt verbalized understanding.   

## 2017-11-08 NOTE — Progress Notes (Signed)
Remote ICD transmission.   

## 2017-11-09 ENCOUNTER — Encounter: Payer: Self-pay | Admitting: Cardiology

## 2017-11-16 LAB — CUP PACEART REMOTE DEVICE CHECK
Brady Statistic AP VP Percent: 0.03 %
Brady Statistic AP VS Percent: 80.1 %
Brady Statistic RV Percent Paced: 0.04 %
Date Time Interrogation Session: 20190515161926
HIGH POWER IMPEDANCE MEASURED VALUE: 44 Ohm
HighPow Impedance: 342 Ohm
HighPow Impedance: 62 Ohm
Implantable Lead Implant Date: 20120906
Implantable Lead Location: 753860
Implantable Lead Model: 185
Implantable Lead Model: 5076
Implantable Lead Serial Number: 358872
Implantable Pulse Generator Implant Date: 20120906
Lead Channel Impedance Value: 380 Ohm
Lead Channel Pacing Threshold Amplitude: 0.5 V
Lead Channel Pacing Threshold Pulse Width: 0.4 ms
Lead Channel Sensing Intrinsic Amplitude: 2.125 mV
Lead Channel Sensing Intrinsic Amplitude: 7.625 mV
Lead Channel Sensing Intrinsic Amplitude: 7.625 mV
Lead Channel Setting Pacing Amplitude: 1.5 V
Lead Channel Setting Pacing Amplitude: 2 V
Lead Channel Setting Pacing Pulse Width: 0.4 ms
Lead Channel Setting Sensing Sensitivity: 0.3 mV
MDC IDC LEAD IMPLANT DT: 20120906
MDC IDC LEAD LOCATION: 753859
MDC IDC MSMT BATTERY VOLTAGE: 2.72 V
MDC IDC MSMT LEADCHNL RA PACING THRESHOLD PULSEWIDTH: 0.4 ms
MDC IDC MSMT LEADCHNL RA SENSING INTR AMPL: 2.125 mV
MDC IDC MSMT LEADCHNL RV IMPEDANCE VALUE: 380 Ohm
MDC IDC MSMT LEADCHNL RV PACING THRESHOLD AMPLITUDE: 0.75 V
MDC IDC STAT BRADY AS VP PERCENT: 0.01 %
MDC IDC STAT BRADY AS VS PERCENT: 19.86 %
MDC IDC STAT BRADY RA PERCENT PACED: 79.73 %

## 2017-12-11 ENCOUNTER — Ambulatory Visit (INDEPENDENT_AMBULATORY_CARE_PROVIDER_SITE_OTHER): Payer: Medicaid Other | Admitting: Pharmacist

## 2017-12-11 DIAGNOSIS — Z7901 Long term (current) use of anticoagulants: Secondary | ICD-10-CM

## 2017-12-11 DIAGNOSIS — I513 Intracardiac thrombosis, not elsewhere classified: Secondary | ICD-10-CM

## 2017-12-11 LAB — POCT INR: INR: 2.5 (ref 2.0–3.0)

## 2017-12-19 ENCOUNTER — Other Ambulatory Visit: Payer: Self-pay | Admitting: Cardiovascular Disease

## 2018-01-23 ENCOUNTER — Ambulatory Visit: Payer: Medicaid Other | Admitting: Pharmacist

## 2018-01-23 DIAGNOSIS — I513 Intracardiac thrombosis, not elsewhere classified: Secondary | ICD-10-CM | POA: Diagnosis not present

## 2018-01-23 DIAGNOSIS — Z7901 Long term (current) use of anticoagulants: Secondary | ICD-10-CM | POA: Diagnosis not present

## 2018-01-23 LAB — POCT INR: INR: 2.7 (ref 2.0–3.0)

## 2018-02-06 ENCOUNTER — Ambulatory Visit (INDEPENDENT_AMBULATORY_CARE_PROVIDER_SITE_OTHER): Payer: Medicaid Other | Admitting: *Deleted

## 2018-02-06 DIAGNOSIS — I255 Ischemic cardiomyopathy: Secondary | ICD-10-CM | POA: Diagnosis not present

## 2018-02-06 DIAGNOSIS — I5042 Chronic combined systolic (congestive) and diastolic (congestive) heart failure: Secondary | ICD-10-CM

## 2018-02-06 NOTE — Progress Notes (Signed)
Remote ICD transmission.   

## 2018-03-03 LAB — CUP PACEART REMOTE DEVICE CHECK
Battery Voltage: 2.68 V
Brady Statistic AP VP Percent: 0.03 %
Brady Statistic AS VP Percent: 0.01 %
Brady Statistic RA Percent Paced: 81.02 %
Brady Statistic RV Percent Paced: 0.04 %
Date Time Interrogation Session: 20190814082310
HIGH POWER IMPEDANCE MEASURED VALUE: 380 Ohm
HighPow Impedance: 46 Ohm
HighPow Impedance: 61 Ohm
Implantable Lead Location: 753859
Implantable Lead Location: 753860
Implantable Lead Model: 185
Implantable Lead Model: 5076
Implantable Lead Serial Number: 358872
Implantable Pulse Generator Implant Date: 20120906
Lead Channel Impedance Value: 380 Ohm
Lead Channel Impedance Value: 399 Ohm
Lead Channel Pacing Threshold Amplitude: 0.625 V
Lead Channel Pacing Threshold Pulse Width: 0.4 ms
Lead Channel Sensing Intrinsic Amplitude: 1.5 mV
Lead Channel Sensing Intrinsic Amplitude: 1.5 mV
Lead Channel Sensing Intrinsic Amplitude: 7.375 mV
Lead Channel Sensing Intrinsic Amplitude: 7.375 mV
Lead Channel Setting Pacing Amplitude: 1.5 V
Lead Channel Setting Pacing Pulse Width: 0.4 ms
MDC IDC LEAD IMPLANT DT: 20120906
MDC IDC LEAD IMPLANT DT: 20120906
MDC IDC MSMT LEADCHNL RA PACING THRESHOLD AMPLITUDE: 0.5 V
MDC IDC MSMT LEADCHNL RV PACING THRESHOLD PULSEWIDTH: 0.4 ms
MDC IDC SET LEADCHNL RV PACING AMPLITUDE: 2 V
MDC IDC SET LEADCHNL RV SENSING SENSITIVITY: 0.3 mV
MDC IDC STAT BRADY AP VS PERCENT: 81.52 %
MDC IDC STAT BRADY AS VS PERCENT: 18.44 %

## 2018-03-06 ENCOUNTER — Ambulatory Visit (INDEPENDENT_AMBULATORY_CARE_PROVIDER_SITE_OTHER): Payer: Medicaid Other | Admitting: Pharmacist

## 2018-03-06 DIAGNOSIS — Z7901 Long term (current) use of anticoagulants: Secondary | ICD-10-CM

## 2018-03-06 DIAGNOSIS — I513 Intracardiac thrombosis, not elsewhere classified: Secondary | ICD-10-CM

## 2018-03-06 LAB — POCT INR: INR: 2.9 (ref 2.0–3.0)

## 2018-04-17 ENCOUNTER — Ambulatory Visit (INDEPENDENT_AMBULATORY_CARE_PROVIDER_SITE_OTHER): Payer: Medicaid Other | Admitting: Pharmacist Clinician (PhC)/ Clinical Pharmacy Specialist

## 2018-04-17 DIAGNOSIS — I513 Intracardiac thrombosis, not elsewhere classified: Secondary | ICD-10-CM

## 2018-04-17 DIAGNOSIS — Z7901 Long term (current) use of anticoagulants: Secondary | ICD-10-CM

## 2018-04-17 LAB — POCT INR: INR: 2.4 (ref 2.0–3.0)

## 2018-05-08 ENCOUNTER — Ambulatory Visit (INDEPENDENT_AMBULATORY_CARE_PROVIDER_SITE_OTHER): Payer: Medicaid Other | Admitting: *Deleted

## 2018-05-08 ENCOUNTER — Telehealth: Payer: Self-pay | Admitting: Cardiology

## 2018-05-08 DIAGNOSIS — I255 Ischemic cardiomyopathy: Secondary | ICD-10-CM

## 2018-05-08 NOTE — Telephone Encounter (Signed)
Spoke with pt and reminded pt of remote transmission that is due today. Pt verbalized understanding.   

## 2018-05-09 NOTE — Progress Notes (Signed)
Remote ICD transmission.   

## 2018-05-29 ENCOUNTER — Ambulatory Visit (INDEPENDENT_AMBULATORY_CARE_PROVIDER_SITE_OTHER): Payer: Medicaid Other | Admitting: Pharmacist

## 2018-05-29 DIAGNOSIS — Z7901 Long term (current) use of anticoagulants: Secondary | ICD-10-CM

## 2018-05-29 DIAGNOSIS — I513 Intracardiac thrombosis, not elsewhere classified: Secondary | ICD-10-CM

## 2018-05-29 LAB — POCT INR: INR: 2.8 (ref 2.0–3.0)

## 2018-06-25 ENCOUNTER — Other Ambulatory Visit: Payer: Self-pay | Admitting: Cardiovascular Disease

## 2018-07-07 LAB — CUP PACEART REMOTE DEVICE CHECK
Battery Voltage: 2.64 V
Brady Statistic AP VP Percent: 0.03 %
Brady Statistic RA Percent Paced: 81.11 %
Date Time Interrogation Session: 20191114061827
HIGH POWER IMPEDANCE MEASURED VALUE: 399 Ohm
HIGH POWER IMPEDANCE MEASURED VALUE: 52 Ohm
HIGH POWER IMPEDANCE MEASURED VALUE: 73 Ohm
Implantable Lead Implant Date: 20120906
Implantable Lead Implant Date: 20120906
Implantable Lead Location: 753860
Implantable Lead Model: 185
Implantable Pulse Generator Implant Date: 20120906
Lead Channel Impedance Value: 437 Ohm
Lead Channel Pacing Threshold Amplitude: 0.5 V
Lead Channel Pacing Threshold Pulse Width: 0.4 ms
Lead Channel Sensing Intrinsic Amplitude: 1.75 mV
Lead Channel Sensing Intrinsic Amplitude: 7.125 mV
Lead Channel Sensing Intrinsic Amplitude: 7.125 mV
Lead Channel Setting Pacing Amplitude: 1.5 V
Lead Channel Setting Pacing Amplitude: 2 V
MDC IDC LEAD LOCATION: 753859
MDC IDC LEAD SERIAL: 358872
MDC IDC MSMT LEADCHNL RA SENSING INTR AMPL: 1.75 mV
MDC IDC MSMT LEADCHNL RV IMPEDANCE VALUE: 399 Ohm
MDC IDC MSMT LEADCHNL RV PACING THRESHOLD AMPLITUDE: 0.625 V
MDC IDC MSMT LEADCHNL RV PACING THRESHOLD PULSEWIDTH: 0.4 ms
MDC IDC SET LEADCHNL RV PACING PULSEWIDTH: 0.4 ms
MDC IDC SET LEADCHNL RV SENSING SENSITIVITY: 0.3 mV
MDC IDC STAT BRADY AP VS PERCENT: 81.85 %
MDC IDC STAT BRADY AS VP PERCENT: 0.01 %
MDC IDC STAT BRADY AS VS PERCENT: 18.11 %
MDC IDC STAT BRADY RV PERCENT PACED: 0.04 %

## 2018-07-10 ENCOUNTER — Ambulatory Visit (INDEPENDENT_AMBULATORY_CARE_PROVIDER_SITE_OTHER): Payer: Medicaid Other | Admitting: Pharmacist

## 2018-07-10 DIAGNOSIS — I513 Intracardiac thrombosis, not elsewhere classified: Secondary | ICD-10-CM

## 2018-07-10 DIAGNOSIS — Z7901 Long term (current) use of anticoagulants: Secondary | ICD-10-CM

## 2018-07-10 LAB — POCT INR: INR: 4.3 — AB (ref 2.0–3.0)

## 2018-07-31 ENCOUNTER — Ambulatory Visit (INDEPENDENT_AMBULATORY_CARE_PROVIDER_SITE_OTHER): Payer: Medicaid Other | Admitting: Pharmacist

## 2018-07-31 DIAGNOSIS — I513 Intracardiac thrombosis, not elsewhere classified: Secondary | ICD-10-CM

## 2018-07-31 DIAGNOSIS — Z7901 Long term (current) use of anticoagulants: Secondary | ICD-10-CM | POA: Diagnosis not present

## 2018-07-31 LAB — POCT INR: INR: 1.9 — AB (ref 2.0–3.0)

## 2018-08-04 ENCOUNTER — Other Ambulatory Visit: Payer: Self-pay | Admitting: Cardiovascular Disease

## 2018-08-07 ENCOUNTER — Ambulatory Visit (INDEPENDENT_AMBULATORY_CARE_PROVIDER_SITE_OTHER): Payer: Medicaid Other

## 2018-08-07 DIAGNOSIS — I255 Ischemic cardiomyopathy: Secondary | ICD-10-CM | POA: Diagnosis not present

## 2018-08-07 DIAGNOSIS — I5042 Chronic combined systolic (congestive) and diastolic (congestive) heart failure: Secondary | ICD-10-CM

## 2018-08-09 ENCOUNTER — Telehealth: Payer: Self-pay

## 2018-08-09 LAB — CUP PACEART REMOTE DEVICE CHECK
Battery Voltage: 2.63 V
Brady Statistic AS VP Percent: 0.01 %
Brady Statistic RA Percent Paced: 79.12 %
Brady Statistic RV Percent Paced: 0.04 %
Date Time Interrogation Session: 20200213153125
HIGH POWER IMPEDANCE MEASURED VALUE: 342 Ohm
HIGH POWER IMPEDANCE MEASURED VALUE: 44 Ohm
HighPow Impedance: 60 Ohm
Implantable Lead Implant Date: 20120906
Implantable Lead Location: 753859
Implantable Lead Model: 185
Implantable Lead Serial Number: 358872
Lead Channel Impedance Value: 380 Ohm
Lead Channel Pacing Threshold Amplitude: 0.625 V
Lead Channel Pacing Threshold Pulse Width: 0.4 ms
Lead Channel Sensing Intrinsic Amplitude: 1.625 mV
Lead Channel Sensing Intrinsic Amplitude: 6.5 mV
Lead Channel Setting Pacing Pulse Width: 0.4 ms
Lead Channel Setting Sensing Sensitivity: 0.3 mV
MDC IDC LEAD IMPLANT DT: 20120906
MDC IDC LEAD LOCATION: 753860
MDC IDC MSMT LEADCHNL RA IMPEDANCE VALUE: 380 Ohm
MDC IDC MSMT LEADCHNL RA PACING THRESHOLD AMPLITUDE: 0.5 V
MDC IDC MSMT LEADCHNL RA SENSING INTR AMPL: 1.625 mV
MDC IDC MSMT LEADCHNL RV PACING THRESHOLD PULSEWIDTH: 0.4 ms
MDC IDC MSMT LEADCHNL RV SENSING INTR AMPL: 6.5 mV
MDC IDC PG IMPLANT DT: 20120906
MDC IDC SET LEADCHNL RA PACING AMPLITUDE: 1.5 V
MDC IDC SET LEADCHNL RV PACING AMPLITUDE: 2 V
MDC IDC STAT BRADY AP VP PERCENT: 0.03 %
MDC IDC STAT BRADY AP VS PERCENT: 79.8 %
MDC IDC STAT BRADY AS VS PERCENT: 20.17 %

## 2018-08-09 NOTE — Telephone Encounter (Signed)
Pt states that [employer] told him we had entered his information in incorrectly is the reason why it does not send automatically. I looked over his information and it looks correct to me. [employer] advised him if he is able to send a manual transmission than there is nothing wrong with the device. I do not know how to make the pt monitor to send automatically.

## 2018-08-12 NOTE — Telephone Encounter (Signed)
Spoke w/ pt and informed him that I am not sure why the home monitor is not sending automatically. I explained to him that everything is properly put into the [employer] [facility] system. I explained to him that it could be an issue w/ the appt not crossing over from Epic to [facility] properly. I explained to him that his next remote appt is 11-06-2018 and that it has been properly scheduled in [place]. I informed him that there is a special note in his appt to call [employer] if this happens again. Pt verbalized understanding.

## 2018-08-19 NOTE — Progress Notes (Signed)
Remote ICD transmission.   

## 2018-08-28 ENCOUNTER — Ambulatory Visit (INDEPENDENT_AMBULATORY_CARE_PROVIDER_SITE_OTHER): Payer: Medicaid Other | Admitting: Pharmacist Clinician (PhC)/ Clinical Pharmacy Specialist

## 2018-08-28 DIAGNOSIS — I513 Intracardiac thrombosis, not elsewhere classified: Secondary | ICD-10-CM

## 2018-08-28 DIAGNOSIS — Z7901 Long term (current) use of anticoagulants: Secondary | ICD-10-CM

## 2018-08-28 LAB — POCT INR: INR: 2.2 (ref 2.0–3.0)

## 2018-09-24 ENCOUNTER — Other Ambulatory Visit: Payer: Self-pay | Admitting: Cardiovascular Disease

## 2018-09-25 ENCOUNTER — Telehealth: Payer: Self-pay | Admitting: Pharmacist Clinician (PhC)/ Clinical Pharmacy Specialist

## 2018-09-25 NOTE — Telephone Encounter (Signed)

## 2018-09-27 ENCOUNTER — Other Ambulatory Visit: Payer: Self-pay

## 2018-09-27 ENCOUNTER — Ambulatory Visit (INDEPENDENT_AMBULATORY_CARE_PROVIDER_SITE_OTHER): Payer: Medicaid Other | Admitting: Pharmacist

## 2018-09-27 DIAGNOSIS — Z7901 Long term (current) use of anticoagulants: Secondary | ICD-10-CM

## 2018-09-27 DIAGNOSIS — I513 Intracardiac thrombosis, not elsewhere classified: Secondary | ICD-10-CM | POA: Diagnosis not present

## 2018-09-27 DIAGNOSIS — Z5181 Encounter for therapeutic drug level monitoring: Secondary | ICD-10-CM | POA: Diagnosis not present

## 2018-09-27 LAB — POCT INR: INR: 3 (ref 2.0–3.0)

## 2018-11-04 ENCOUNTER — Telehealth: Payer: Self-pay

## 2018-11-04 NOTE — Telephone Encounter (Signed)

## 2018-11-05 ENCOUNTER — Other Ambulatory Visit: Payer: Self-pay

## 2018-11-05 ENCOUNTER — Ambulatory Visit (INDEPENDENT_AMBULATORY_CARE_PROVIDER_SITE_OTHER): Payer: Medicaid Other | Admitting: Pharmacist

## 2018-11-05 DIAGNOSIS — Z7901 Long term (current) use of anticoagulants: Secondary | ICD-10-CM | POA: Diagnosis not present

## 2018-11-05 DIAGNOSIS — I513 Intracardiac thrombosis, not elsewhere classified: Secondary | ICD-10-CM | POA: Diagnosis not present

## 2018-11-05 LAB — POCT INR: INR: 3 (ref 2.0–3.0)

## 2018-11-06 ENCOUNTER — Other Ambulatory Visit: Payer: Self-pay

## 2018-11-06 ENCOUNTER — Ambulatory Visit (INDEPENDENT_AMBULATORY_CARE_PROVIDER_SITE_OTHER): Payer: Medicaid Other | Admitting: *Deleted

## 2018-11-06 DIAGNOSIS — I5042 Chronic combined systolic (congestive) and diastolic (congestive) heart failure: Secondary | ICD-10-CM

## 2018-11-06 DIAGNOSIS — I255 Ischemic cardiomyopathy: Secondary | ICD-10-CM

## 2018-11-06 LAB — CUP PACEART REMOTE DEVICE CHECK
Battery Voltage: 2.62 V
Brady Statistic AP VP Percent: 0.02 %
Brady Statistic AP VS Percent: 78.11 %
Brady Statistic AS VP Percent: 0.01 %
Brady Statistic AS VS Percent: 21.85 %
Brady Statistic RA Percent Paced: 77.49 %
Brady Statistic RV Percent Paced: 0.04 %
Date Time Interrogation Session: 20200513052409
HighPow Impedance: 399 Ohm
HighPow Impedance: 51 Ohm
HighPow Impedance: 70 Ohm
Implantable Lead Implant Date: 20120906
Implantable Lead Implant Date: 20120906
Implantable Lead Location: 753859
Implantable Lead Location: 753860
Implantable Lead Model: 185
Implantable Lead Model: 5076
Implantable Lead Serial Number: 358872
Implantable Pulse Generator Implant Date: 20120906
Lead Channel Impedance Value: 380 Ohm
Lead Channel Impedance Value: 399 Ohm
Lead Channel Pacing Threshold Amplitude: 0.5 V
Lead Channel Pacing Threshold Amplitude: 0.75 V
Lead Channel Pacing Threshold Pulse Width: 0.4 ms
Lead Channel Pacing Threshold Pulse Width: 0.4 ms
Lead Channel Sensing Intrinsic Amplitude: 1.75 mV
Lead Channel Sensing Intrinsic Amplitude: 1.75 mV
Lead Channel Sensing Intrinsic Amplitude: 6.75 mV
Lead Channel Sensing Intrinsic Amplitude: 6.75 mV
Lead Channel Setting Pacing Amplitude: 1.5 V
Lead Channel Setting Pacing Amplitude: 2 V
Lead Channel Setting Pacing Pulse Width: 0.4 ms
Lead Channel Setting Sensing Sensitivity: 0.3 mV

## 2018-11-10 ENCOUNTER — Other Ambulatory Visit: Payer: Self-pay | Admitting: Cardiovascular Disease

## 2018-11-13 ENCOUNTER — Telehealth: Payer: Self-pay | Admitting: *Deleted

## 2018-11-13 NOTE — Telephone Encounter (Signed)
LMOVM requesting call back to DC. ICD nearing ERI as of most recent transmission on 11/06/18. Will advise pt of alert tone and upcoming appointment with Dr. Sallyanne Kuster on 11/20/18.

## 2018-11-13 NOTE — Telephone Encounter (Signed)
Pt called and LMOVM returning call.   Called pt back and LMOVM.

## 2018-11-13 NOTE — Telephone Encounter (Signed)
Spoke w/ pt and informed him of appt w/ MD. Informed him that his device is nearing ERI and the alert tone he could hear. Informed him if he hears the alert tone to call the office. Pt verbalized understanding.

## 2018-11-15 ENCOUNTER — Telehealth: Payer: Self-pay | Admitting: Cardiovascular Disease

## 2018-11-15 NOTE — Telephone Encounter (Signed)
lmtcb to verify if May 27 appt with Dr C can be changed to virtual.  If so, please change appt type.

## 2018-11-16 ENCOUNTER — Other Ambulatory Visit: Payer: Self-pay | Admitting: Cardiovascular Disease

## 2018-11-20 ENCOUNTER — Telehealth (INDEPENDENT_AMBULATORY_CARE_PROVIDER_SITE_OTHER): Payer: Medicaid Other | Admitting: Cardiovascular Disease

## 2018-11-20 ENCOUNTER — Encounter: Payer: Self-pay | Admitting: Cardiovascular Disease

## 2018-11-20 VITALS — Ht 71.0 in | Wt 184.0 lb

## 2018-11-20 DIAGNOSIS — I25118 Atherosclerotic heart disease of native coronary artery with other forms of angina pectoris: Secondary | ICD-10-CM | POA: Diagnosis not present

## 2018-11-20 DIAGNOSIS — N183 Chronic kidney disease, stage 3 unspecified: Secondary | ICD-10-CM

## 2018-11-20 DIAGNOSIS — I472 Ventricular tachycardia, unspecified: Secondary | ICD-10-CM

## 2018-11-20 DIAGNOSIS — I5042 Chronic combined systolic (congestive) and diastolic (congestive) heart failure: Secondary | ICD-10-CM

## 2018-11-20 DIAGNOSIS — I513 Intracardiac thrombosis, not elsewhere classified: Secondary | ICD-10-CM | POA: Diagnosis not present

## 2018-11-20 DIAGNOSIS — N1831 Chronic kidney disease, stage 3a: Secondary | ICD-10-CM | POA: Insufficient documentation

## 2018-11-20 DIAGNOSIS — E785 Hyperlipidemia, unspecified: Secondary | ICD-10-CM

## 2018-11-20 DIAGNOSIS — Z7901 Long term (current) use of anticoagulants: Secondary | ICD-10-CM

## 2018-11-20 DIAGNOSIS — Z95810 Presence of automatic (implantable) cardiac defibrillator: Secondary | ICD-10-CM

## 2018-11-20 MED ORDER — ISOSORBIDE MONONITRATE ER 30 MG PO TB24: 30.0000 mg | ORAL_TABLET | Freq: Every day | ORAL | 11 refills | Status: DC

## 2018-11-20 MED ORDER — FUROSEMIDE 40 MG PO TABS: 40.0000 mg | ORAL_TABLET | Freq: Every day | ORAL | 6 refills | Status: DC

## 2018-11-20 NOTE — Patient Instructions (Signed)
Medication Instructions:  Start Isosorbide 30 mg daily Continue all other medication  If you need a refill on your cardiac medications before your next appointment, please call your pharmacy.   Lab work: Cmet,Lipid Panel,Cbc to be done before ICD generator change   Testing/Procedures: None ordered  Follow-Up: At [facility], you and your health needs are our priority.  As part of our continuing mission to provide you with exceptional heart care, we have created designDrated Provider Care Teams.  These Care Teams include your primary Cardiologist (physician) and Advanced Practice Providers (APPs -  Physician Assistants and Nurse Practitioners) who all work together to provide you with the care you need, when you need it. . Dr.Croitoru's RN will call when time to schedule ICD generator change . Scheduler will call with appointment with Dr.Kelly . Schedule follow up with Dr.Croitoru in 12 months   Call 3 months before to schedule

## 2018-11-20 NOTE — Progress Notes (Signed)
Virtual Visit via Telephone Note   This visit type was conducted due to national recommendations for restrictions regarding the COVID-19 Pandemic (e.g. social distancing) in an effort to limit this patient's exposure and mitigate transmission in our community.  Due to his co-morbid illnesses, this patient is at least at moderate risk for complications without adequate follow up.  This format is felt to be most appropriate for this patient at this time.  The patient did not have access to video technology/had technical difficulties with video requiring transitioning to audio format only (telephone).  All issues noted in this document were discussed and addressed.  No physical exam could be performed with this format.  Please refer to the patient's chart for his  consent to telehealth for [facility].   Date:  11/20/2018   ID:  [name], [name] [birth year], MRN [id number]  Patient Location: Home Provider Location: Home  PCP:  Lorelee Market, MD  Cardiologist:  Kelly/ Laron Angelini (device) Electrophysiologist:  None   Evaluation Performed:  Follow-Up Visit  Chief Complaint: Exertional angina, ICD approaching RRT  History of Present Illness:    Steven [name] is a 63 y.o. 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 Graylon Gunning), hypercholesterolemia, elevated PSA.  He is generally been doing well.  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  He does not have chest discomfort at work but likes to walk for exercise and predictably develops chest discomfort when walking faster or longer than usual, promptly alleviated by rest after 1 or 2 minutes.   He does not experience exertional dyspnea and has not had leg edema.  His weight has been consistently around 185 pounds.  He has not had dizziness, syncope, palpitations or defibrillator discharges.  He is compliant with warfarin anticoagulation and has not experienced any injuries, falls or bleeding problems.  Very recent remote ICD download shows device lead parameters are normal, but the generator is very close to RRT.  Last voltage was actually 2.62 V, but the device has not yet declared ERI.  Occasional nonsustained ventricular tachycardia up to 7 beats.  He has never delivered tachycardia therapies from his device.  He is not device dependent but does paced the atrium about 80% of the time does not require ventricular pacing.  OptiVol at baseline.  His PSA is elevated, but the most recent assay showed a slight decrease.  He is scheduled to have another appointment with his urologist, Dr. Junious Silk in August when they will decide whether or not he needs a prostate biopsy.  The patient does not have symptoms concerning for COVID-19 infection (fever, chills, cough, or new shortness of breath).    Past Medical History:  Diagnosis Date  . Acute MI, anterior wall ([place])   . CAD (coronary artery disease)    2D ECHO, 07/13/2011 - EF <25%, LV moderatelty dilated, LA moderately dilatedLEXISCAN, 12/14/2011 - moderate-severe perfusion defect seen in the basal anteroseptal, mid anterior, apicacl anterior, apical, apical inferior, and apical lateral regions, post-stress EF 25%, new EKG changes from baseline abnormalities  . Inguinal hernia, left    Past Surgical History:  Procedure Laterality Date  . CARDIAC CATHETERIZATION  11/25/2010   Predilation balloon-Apex monorail 2x47mm, Cutting balloon-2.25x41mm,  resulting in a reduction of 100% stenosis down to less than 10%  . CARDIAC CATHETERIZATION  07/12/2010   LAD stented with a 3.5x28mm bare-metal Veriflex stent resulting in a reduction of 100% lesion to 0%   . CARDIAC DEFIBRILLATOR PLACEMENT  03/02/2011   Medtronic Protecta XT DR, model [id number], serial [id number]  . PACEMAKER PLACEMENT  07/14/2010   Temporary placement of pacemaker, if rhythm issue continues will need a permanent device     Current Meds  Medication Sig  . albuterol (PROVENTIL HFA;VENTOLIN HFA) 108 (90 Base) MCG/ACT inhaler Inhale 1-2 puffs into the lungs every 6 (six) hours as needed for wheezing or shortness of breath.  [name] atorvastatin (LIPITOR) 40 MG tablet Take 1 tablet by mouth once daily  . beclomethasone (QVAR) 80 MCG/ACT inhaler Inhale 1 puff into the lungs 2 (two) times daily.  . carvedilol (COREG) 25 MG tablet TAKE 1 TABLET BY MOUTH TWICE DAILY WITH A MEAL  . clomiPHENE (CLOMID) 50 MG tablet Take 25 mg by mouth every other day.  . furosemide (LASIX) 40 MG tablet Take 1 tablet (40 mg total) by mouth daily.  [name] gabapentin (NEURONTIN) 300 MG capsule Take 300 mg by mouth at bedtime.  [name] lisinopril (PRINIVIL,ZESTRIL) 5 MG tablet Take 1 tablet by mouth once daily  . montelukast (SINGULAIR) 10 MG tablet Take 10 mg by mouth daily.  . Multiple Vitamin (MULTIVITAMIN) tablet Take 1 tablet by mouth daily.  . nitroGLYCERIN (NITROSTAT) 0.4 MG SL tablet DISSOLVE ONE TABLET UNDER THE TONGUE EVERY 5 MINUTES AS NEEDED FOR CHEST PAIN.  DO NOT EXCEED A TOTAL OF 3 DOSES IN 15 MINUTES  . spironolactone (ALDACTONE) 25 MG tablet TAKE 1/2 TABLET BY MOUTH TWICE DAILY  . tiotropium (SPIRIVA) 18 MCG inhalation capsule Place 18 mcg into inhaler and inhale daily.  [name] warfarin (COUMADIN) 5 MG tablet TAKE 1/2 TO 1 (ONE-HALF TO ONE) TABLET BY MOUTH AS DIRECTED BY  COUMADIN  CLINIC  . [DISCONTINUED] furosemide (LASIX) 40 MG tablet Take 1 tablet by mouth once daily  . [DISCONTINUED] tamsulosin (FLOMAX) 0.4 MG CAPS capsule Take 0.4 mg by mouth daily.  . [DISCONTINUED] traZODone (DESYREL) 50 MG tablet Take 50 mg by mouth at bedtime.     Allergies:   Patient has no known allergies.   Social History    Tobacco Use  . Smoking status: Former Smoker    Types: Cigarettes, Cigars  . Smokeless tobacco: Never Used  Substance Use Topics  . Alcohol use: Yes    Alcohol/week: 2.0 - 3.0 standard drinks    Types: 2 - 3 drink(s) per week  . Drug use: No     Family Hx: The patient's family history is not on file.  ROS:   Please see the history of present illness.     All other systems reviewed and are negative.   Prior CV studies:   The following studies were reviewed today:  Echo November 2018.  LVEF 20-25%.  No mural thrombus seen.  Labs/Other Tests and Data Reviewed:    EKG:  An ECG dated 09/26/2017 was personally reviewed today and demonstrated:  Atrial paced, ventricular sensed rhythm, extensive anterolateral infarction with loss of R waves throughout the precordial leads, left axis deviation, no acute ischemic abnormalities, QTC 404 ms  Recent Labs: No results found for requested labs within last 8760 hours.   Recent Lipid Panel Lab Results  Component Value Date/Time   CHOL 137 08/29/2017 09:04 AM   CHOL 161 07/01/2013 08:38 AM  TRIG 96 08/29/2017 09:04 AM   TRIG 108 07/01/2013 08:38 AM   HDL 44 08/29/2017 09:04 AM   HDL 43 07/01/2013 08:38 AM   CHOLHDL 3.1 08/29/2017 09:04 AM   CHOLHDL 3.3 09/03/2014 08:03 AM   LDLCALC 74 08/29/2017 09:04 AM   LDLCALC 96 07/01/2013 08:38 AM    Wt Readings from Last 3 Encounters:  11/20/18 184 lb (83.5 kg)  09/26/17 191 lb (86.6 kg)  08/22/17 191 lb (86.6 kg)     Objective:    Vital Signs:  Ht 5\' 11"  (1.803 m)   Wt 184 lb (83.5 kg)   BMI 25.66 kg/m    VITAL SIGNS:  reviewed Unable to examine.  He reports that his blood pressure was consistently in the 120/70s until recently, but his blood pressure cuff is currently nonfunctional.  ASSESSMENT & PLAN:    1. CAD: With stable angina pectoris CCS functional class I-II.  On maximum dose carvedilol.  We will add isosorbide mononitrate 30 mg once daily.  Warned him that he may  develop transient headaches which she developed tolerance within a week.  Discussed the difference between stable and unstable ischemic symptoms.  Not on aspirin due to warfarin anticoagulation.  On statin. 2. CHF: Chronic combined systolic and diastolic.  Well compensated on low-dose of diuretic.  He is very compliant with sodium restriction and weight monitoring.  No signs of hypervolemia.  NYHA functional class I-II. on maximum dose carvedilol. He is also receiving a relatively low doses of ACE inhibitor and spironolactone.  He is a good candidate for Entresto, but will leave that decision up to Dr. Claiborne Billings. 3. ICD: Device generator was seen in the need a changeout.  Discussed the procedure with him in detail.  He will not need to stop anticoagulation fully but would like to make sure that his INR is around 2.0.  Reviewed the coronavirus prevention strategy that he will have to go through to be allowed into the hospital for the procedure. 4. HLP: We will recheck his lipids when he has lab drawn for the anticipated ICD generator change. 5. CKD 3: Most recent creatinine 1.63 in March 2020.  His nephrologist is Dr. Posey Pronto. 6. History of LV thrombus: On chronic warfarin anticoagulation.  May need to stop his Coumadin with enoxaparin "bridging" if he has to have a prostate biopsy.  That decision will come up in August. 7. NSVT:  8. Elevated PSA  COVID-19 Education: The signs and symptoms of COVID-19 were discussed with the patient and how to seek care for testing (follow up with PCP or arrange E-visit).  The importance of social distancing was discussed today.  Time:   Today, I have spent 30 minutes with the patient with telehealth technology discussing the above problems.     Medication Adjustments/Labs and Tests Ordered: Current medicines are reviewed at length with the patient today.  Concerns regarding medicines are outlined above.   Tests Ordered: No orders of the defined types were placed in  this encounter.   Medication Changes: Meds ordered this encounter  Medications  . isosorbide mononitrate (IMDUR) 30 MG 24 hr tablet    Sig: Take 1 tablet (30 mg total) by mouth daily.    Dispense:  30 tablet    Refill:  11  . furosemide (LASIX) 40 MG tablet    Sig: Take 1 tablet (40 mg total) by mouth daily.    Dispense:  30 tablet    Refill:  6    Disposition:  Follow up 12 months  Signed, Sanda Klein, MD  11/20/2018 9:03 AM    [place]

## 2018-11-22 NOTE — Progress Notes (Signed)
Remote ICD transmission.   

## 2018-12-10 ENCOUNTER — Telehealth: Payer: Self-pay

## 2018-12-10 NOTE — Telephone Encounter (Signed)

## 2018-12-11 ENCOUNTER — Other Ambulatory Visit: Payer: Self-pay | Admitting: Cardiovascular Disease

## 2018-12-17 ENCOUNTER — Other Ambulatory Visit: Payer: Self-pay

## 2018-12-17 ENCOUNTER — Ambulatory Visit (INDEPENDENT_AMBULATORY_CARE_PROVIDER_SITE_OTHER): Payer: Medicaid Other | Admitting: Pharmacist Clinician (PhC)/ Clinical Pharmacy Specialist

## 2018-12-17 DIAGNOSIS — Z7901 Long term (current) use of anticoagulants: Secondary | ICD-10-CM

## 2018-12-17 DIAGNOSIS — I513 Intracardiac thrombosis, not elsewhere classified: Secondary | ICD-10-CM | POA: Diagnosis not present

## 2018-12-17 LAB — POCT INR: INR: 1.8 — AB (ref 2.0–3.0)

## 2018-12-18 ENCOUNTER — Other Ambulatory Visit: Payer: Self-pay | Admitting: Cardiovascular Disease

## 2019-01-13 ENCOUNTER — Telehealth: Payer: Self-pay

## 2019-01-13 NOTE — Telephone Encounter (Signed)

## 2019-01-15 ENCOUNTER — Other Ambulatory Visit: Payer: Self-pay

## 2019-01-15 ENCOUNTER — Ambulatory Visit (INDEPENDENT_AMBULATORY_CARE_PROVIDER_SITE_OTHER): Payer: Medicaid Other | Admitting: Pharmacist Clinician (PhC)/ Clinical Pharmacy Specialist

## 2019-01-15 DIAGNOSIS — I513 Intracardiac thrombosis, not elsewhere classified: Secondary | ICD-10-CM | POA: Diagnosis not present

## 2019-01-15 DIAGNOSIS — Z7901 Long term (current) use of anticoagulants: Secondary | ICD-10-CM | POA: Diagnosis not present

## 2019-01-15 LAB — POCT INR: INR: 2 (ref 2.0–3.0)

## 2019-01-15 NOTE — Patient Instructions (Signed)
Continue with 1 tablet each Monday, Wednesday and Friday, 1/2 tablet all other days.  Repeat INR in 6 weeks  

## 2019-01-23 ENCOUNTER — Telehealth: Payer: Self-pay | Admitting: *Deleted

## 2019-01-23 NOTE — Telephone Encounter (Signed)
Left a message for the patient to call back. He had a question about ERI. Left a message to discuss this with him.

## 2019-01-24 NOTE — Telephone Encounter (Signed)
Follow up: ° ° ° °Patient returning call from yesterday. Please call patient back. °

## 2019-01-24 NOTE — Telephone Encounter (Signed)
[phone] mobile Spoke with patient and advised Lattie Haw out of the office, she will call next week. Patient requested he be called back on mobile number listed

## 2019-01-27 MED ORDER — ISOSORBIDE MONONITRATE ER 60 MG PO TB24: 60.0000 mg | ORAL_TABLET | Freq: Every day | ORAL | 11 refills | Status: DC

## 2019-01-27 NOTE — Telephone Encounter (Signed)
The patient has been advised on Dr. Victorino December recommendations. A new prescription for Imdur 60 mg once daily has been sent in.  He has been advised to call if the Imdur is not helping and if the tightness is getting worse and is not relieved at rest. He has verbalized his understanding.

## 2019-01-27 NOTE — Telephone Encounter (Signed)
Spoke with the patient and let him know that he is close to ERI but not there yet. His device will send a message to the device clinic when it is time. He has verbalized his understanding.  He also stated that he is still having chest tightness on exertion. When he walks or exerts himself, he has to pause for a minute or two until the tightness goes away.  He feels like the 30 mg Imdur has not helped that much.

## 2019-01-27 NOTE — Telephone Encounter (Signed)
Please increase the imdur to 60 mg daily. As long as the symptoms are predictably brought on by exercise and alleviated by rest, OK to delay invasive evaluation (cath). If we cannot control angina with meds, can try to get in for cardiac cath on same day as device generator changeout.

## 2019-02-05 ENCOUNTER — Ambulatory Visit (INDEPENDENT_AMBULATORY_CARE_PROVIDER_SITE_OTHER): Payer: Medicaid Other | Admitting: *Deleted

## 2019-02-05 DIAGNOSIS — I255 Ischemic cardiomyopathy: Secondary | ICD-10-CM

## 2019-02-06 LAB — CUP PACEART REMOTE DEVICE CHECK
Battery Voltage: 2.6 V
Brady Statistic AP VP Percent: 0.02 %
Brady Statistic AP VS Percent: 77.53 %
Brady Statistic AS VP Percent: 0.02 %
Brady Statistic AS VS Percent: 22.42 %
Brady Statistic RA Percent Paced: 77.22 %
Brady Statistic RV Percent Paced: 0.04 %
Date Time Interrogation Session: 20200813073140
HighPow Impedance: 342 Ohm
HighPow Impedance: 44 Ohm
HighPow Impedance: 57 Ohm
Implantable Lead Implant Date: 20120906
Implantable Lead Implant Date: 20120906
Implantable Lead Location: 753859
Implantable Lead Location: 753860
Implantable Lead Model: 185
Implantable Lead Model: 5076
Implantable Lead Serial Number: 358872
Implantable Pulse Generator Implant Date: 20120906
Lead Channel Impedance Value: 380 Ohm
Lead Channel Impedance Value: 380 Ohm
Lead Channel Pacing Threshold Amplitude: 0.5 V
Lead Channel Pacing Threshold Amplitude: 0.75 V
Lead Channel Pacing Threshold Pulse Width: 0.4 ms
Lead Channel Pacing Threshold Pulse Width: 0.4 ms
Lead Channel Sensing Intrinsic Amplitude: 1.5 mV
Lead Channel Sensing Intrinsic Amplitude: 1.5 mV
Lead Channel Sensing Intrinsic Amplitude: 6 mV
Lead Channel Sensing Intrinsic Amplitude: 6 mV
Lead Channel Setting Pacing Amplitude: 1.5 V
Lead Channel Setting Pacing Amplitude: 2 V
Lead Channel Setting Pacing Pulse Width: 0.4 ms
Lead Channel Setting Sensing Sensitivity: 0.3 mV

## 2019-02-07 ENCOUNTER — Other Ambulatory Visit: Payer: Self-pay | Admitting: Cardiovascular Disease

## 2019-02-11 ENCOUNTER — Other Ambulatory Visit: Payer: Self-pay | Admitting: Cardiovascular Disease

## 2019-02-13 ENCOUNTER — Other Ambulatory Visit: Payer: Self-pay | Admitting: Cardiovascular Disease

## 2019-02-13 ENCOUNTER — Encounter: Payer: Self-pay | Admitting: Cardiology

## 2019-02-13 NOTE — Progress Notes (Signed)
Remote ICD transmission.   

## 2019-02-14 ENCOUNTER — Telehealth: Payer: Self-pay | Admitting: *Deleted

## 2019-02-14 DIAGNOSIS — Z01818 Encounter for other preprocedural examination: Secondary | ICD-10-CM

## 2019-02-14 DIAGNOSIS — Z95810 Presence of automatic (implantable) cardiac defibrillator: Secondary | ICD-10-CM

## 2019-02-14 DIAGNOSIS — Z7901 Long term (current) use of anticoagulants: Secondary | ICD-10-CM

## 2019-02-14 DIAGNOSIS — E785 Hyperlipidemia, unspecified: Secondary | ICD-10-CM

## 2019-02-14 NOTE — Telephone Encounter (Signed)
Left a message for the patient to call back to schedule his generator changeout.

## 2019-02-14 NOTE — Telephone Encounter (Signed)
-----   Message from Sanda Klein, MD sent at 02/12/2019 12:22 PM EDT ----- Remote reviewed.   Not pacemaker dependent. Battery status is virtually at RRT (voltage 2.60V).  Lead measurements are stable. Heart rate histogram is favorable. No clinically significant episodes of high ventricular rate or atrial mode switch noted.  Please start the process to schedule for generator changeout on Monday, August 31 He is due a fasting lipid profile, please check with the preop labs.

## 2019-02-14 NOTE — Telephone Encounter (Signed)
The patient has been called and notified that it is time for the generator changeout. The procedure has been scheduled for 8/31 at 1 pm. The patient has asked that we call him back on Monday to give him instructions.

## 2019-02-17 ENCOUNTER — Encounter: Payer: Self-pay | Admitting: *Deleted

## 2019-02-17 ENCOUNTER — Telehealth: Payer: Self-pay

## 2019-02-17 NOTE — Telephone Encounter (Signed)
-----   Message from Sanda Klein, MD sent at 02/17/2019 11:54 AM EDT ----- Planning device generator change 08/31. Can we please move his appt from 9/2 to 8/26-8/27, would like INR 1.8-2.0 please

## 2019-02-17 NOTE — Telephone Encounter (Signed)
CALLED AND RESCHEDULED PT DUE TO A DEVICE CHANGE AND THEY WERE COMPLIANT TO COME IN SOONER

## 2019-02-17 NOTE — Telephone Encounter (Signed)
The patient has been made aware of the instructions. He has been advised that he will get a copy of the instructions and given the scrub when he comes for his coumadin appointment and labs on 02/19/2019    Implantable Device Instructions  You are scheduled for a Generator Change (battery change) on  02/24/2019  with Dr. Sallyanne Kuster.  1.   Please arrive at the [facility], Entrance "A"  at [facility] at 12 noon on the day of your procedure. (The address is [address])  2. Do not eat or drink after midnight the night before your procedure.  3.   Your provider would like for you to return on 02/19/2019 to have the following labs drawn: BMET and CBC. You do not need an appointment for the lab. Once in our office lobby there is a podium where you can sign in and ring the doorbell to alert [place] that you are here. The lab is open from 8:00 am to 4:30 pm; closed for lunch from 12:45pm-1:45pm. Please have your labs done after your coumadin appointment at 8:45 am on 02/19/2019.  You will need to have the coronavirus test completed prior to your procedure. An appointment has been made at 8:50 on 02/20/2019. This is a Drive Up Visit at the [employer] [address]. Someone will direct you to the appropriate testing line. Stay in your car and someone will be with you shortly. Please make sure to have all other labs completed before this test because you will need to stay quarantined until your procedure.   4.  All of your medications may be taken with a small amount of water the morning of your procedure.  5. Bring your insurance cards and a list of you medications.  6.  Wash your chest and neck with surgical scrub the evening before and the morning of your procedure.  Rinse well. Please review the surgical scrub instruction sheet given to you.  7. Your chest will need to be shaved prior to this procedure (if needed). We ask that you do this yourself at home 1 to 2 days before  or if uncomfortable/unable to do yourself, then it will be performed by the hospital staff the day of.                                                                                                     * If you have any questions after you get home, please call Lattie Haw, RN at [phone].  * Every attempt is made to prevent procedures from being rescheduled.  Due to the nature of  Electrophysiology, rescheduling can happen.  The physician is always aware and directs the staff when this occurs.   [place] - Preparing For Surgery  Before surgery, you can play an important role. Because skin is not sterile, your skin needs to be as free of germs as possible. You can reduce the number of germs on your skin by washing with CHG (chlorahexidine gluconate) Soap before surgery.  CHG is an antiseptic cleaner which  kills germs and bonds with the skin to continue killing germs even after washing.   Please do not use if you have an allergy to CHG or antibacterial soaps.  If your skin becomes reddened/irritated stop using the CHG.   Do not shave (including legs and underarms) for at least 48 hours prior to first CHG shower.  It is OK to shave your face.  Please follow these instructions carefully:  1.  Shower the night before surgery and the morning of surgery with CHG.  2.  If you choose to wash your hair, wash your hair first as usual with your normal shampoo.  3.  After you shampoo, rinse your hair and body thoroughly to remove the shampoo.  4.  Use CHG as you would any other liquid soap.  You can apply CHG directly to the skin and wash gently with a clean washcloth. 5.  Apply the CHG Soap to your body ONLY FROM THE NECK DOWN.  Do not use on open wounds or open sores.  Avoid contact with your eyes, ears, mouth and genitals (private parts).  Wash genitals (private parts) with your normal soap.  6.  Wash thoroughly, paying special attention to the area where your surgery will be performed.  7.   Thoroughly rise your body with warm water from the neck down.   8.  DO NOT shower/wash with your normal soap after using and rinsing off the CHG soap.  9.  Pat yourself dry with a clean towel.           10.  Wear clean pajamas.           11.  Place clean sheets on your bed the night of your first shower and do not sleep with pets.  Day of Surgery: Do not apply any deodorants/lotions.  Please wear clean clothes to the hospital/surgery center.

## 2019-02-19 ENCOUNTER — Other Ambulatory Visit: Payer: Self-pay

## 2019-02-19 ENCOUNTER — Ambulatory Visit (INDEPENDENT_AMBULATORY_CARE_PROVIDER_SITE_OTHER): Payer: Medicaid Other | Admitting: Pharmacist Clinician (PhC)/ Clinical Pharmacy Specialist

## 2019-02-19 DIAGNOSIS — I513 Intracardiac thrombosis, not elsewhere classified: Secondary | ICD-10-CM | POA: Diagnosis not present

## 2019-02-19 DIAGNOSIS — Z7901 Long term (current) use of anticoagulants: Secondary | ICD-10-CM

## 2019-02-19 LAB — POCT INR: INR: 2.3 (ref 2.0–3.0)

## 2019-02-20 ENCOUNTER — Other Ambulatory Visit (HOSPITAL_COMMUNITY)
Admission: RE | Admit: 2019-02-20 | Discharge: 2019-02-20 | Disposition: A | Discharge status: Home / Self Care | Payer: Medicaid Other | Source: Ambulatory Visit | Attending: Cardiovascular Disease | Admitting: Cardiovascular Disease

## 2019-02-20 ENCOUNTER — Other Ambulatory Visit (HOSPITAL_COMMUNITY): Payer: Medicaid Other

## 2019-02-20 DIAGNOSIS — Z20828 Contact with and (suspected) exposure to other viral communicable diseases: Secondary | ICD-10-CM | POA: Diagnosis not present

## 2019-02-20 DIAGNOSIS — Z01812 Encounter for preprocedural laboratory examination: Secondary | ICD-10-CM | POA: Insufficient documentation

## 2019-02-20 LAB — SARS CORONAVIRUS 2 (TAT 6-24 HRS): SARS Coronavirus 2: NEGATIVE

## 2019-02-22 ENCOUNTER — Other Ambulatory Visit: Payer: Self-pay | Admitting: Cardiovascular Disease

## 2019-02-22 LAB — CBC
Hematocrit: 42.4 % (ref 37.5–51.0)
Hemoglobin: 14.6 g/dL (ref 13.0–17.7)
MCH: 31.8 pg (ref 26.6–33.0)
MCHC: 34.4 g/dL (ref 31.5–35.7)
MCV: 92 fL (ref 79–97)
Platelets: 156 10*3/uL (ref 150–450)
RBC: 4.59 x10E6/uL (ref 4.14–5.80)
RDW: 11.7 % (ref 11.6–15.4)
WBC: 6.1 10*3/uL (ref 3.4–10.8)

## 2019-02-22 LAB — BASIC METABOLIC PANEL
BUN/Creatinine Ratio: 13 (ref 10–24)
BUN: 18 mg/dL (ref 8–27)
CO2: 19 mmol/L — ABNORMAL LOW (ref 20–29)
Calcium: 9.2 mg/dL (ref 8.6–10.2)
Chloride: 100 mmol/L (ref 96–106)
Creatinine, Ser: 1.41 mg/dL — ABNORMAL HIGH (ref 0.76–1.27)
GFR calc Af Amer: 60 mL/min/{1.73_m2} (ref >59)
GFR calc non Af Amer: 52 mL/min/{1.73_m2} — ABNORMAL LOW (ref >59)
Glucose: 94 mg/dL (ref 65–99)
Potassium: 4.5 mmol/L (ref 3.5–5.2)
Sodium: 134 mmol/L (ref 134–144)

## 2019-02-22 LAB — LIPID PANEL
Chol/HDL Ratio: 3 ratio (ref 0.0–5.0)
Cholesterol, Total: 125 mg/dL (ref 100–199)
HDL: 41 mg/dL (ref >39)
LDL Calculated: 62 mg/dL (ref 0–99)
Triglycerides: 112 mg/dL (ref 0–149)
VLDL Cholesterol Cal: 22 mg/dL (ref 5–40)

## 2019-02-22 LAB — PROTIME-INR

## 2019-02-24 ENCOUNTER — Ambulatory Visit (HOSPITAL_COMMUNITY)
Admission: RE | Disposition: A | Discharge status: Home / Self Care | Payer: Self-pay | Source: Home / Self Care | Attending: Cardiovascular Disease

## 2019-02-24 ENCOUNTER — Telehealth: Payer: Self-pay | Admitting: Cardiovascular Disease

## 2019-02-24 ENCOUNTER — Ambulatory Visit (HOSPITAL_COMMUNITY)
Admission: RE | Admit: 2019-02-24 | Discharge: 2019-02-24 | Disposition: A | Discharge status: Home / Self Care | Payer: Medicaid Other | Attending: Cardiovascular Disease | Admitting: Cardiovascular Disease

## 2019-02-24 ENCOUNTER — Other Ambulatory Visit: Payer: Self-pay

## 2019-02-24 DIAGNOSIS — F17290 Nicotine dependence, other tobacco product, uncomplicated: Secondary | ICD-10-CM | POA: Diagnosis not present

## 2019-02-24 DIAGNOSIS — I255 Ischemic cardiomyopathy: Secondary | ICD-10-CM | POA: Insufficient documentation

## 2019-02-24 DIAGNOSIS — Z4502 Encounter for adjustment and management of automatic implantable cardiac defibrillator: Secondary | ICD-10-CM | POA: Diagnosis not present

## 2019-02-24 DIAGNOSIS — E7800 Pure hypercholesterolemia, unspecified: Secondary | ICD-10-CM | POA: Diagnosis not present

## 2019-02-24 DIAGNOSIS — Z95810 Presence of automatic (implantable) cardiac defibrillator: Secondary | ICD-10-CM | POA: Diagnosis present

## 2019-02-24 DIAGNOSIS — T50905A Adverse effect of unspecified drugs, medicaments and biological substances, initial encounter: Secondary | ICD-10-CM

## 2019-02-24 DIAGNOSIS — I5042 Chronic combined systolic (congestive) and diastolic (congestive) heart failure: Secondary | ICD-10-CM | POA: Diagnosis not present

## 2019-02-24 DIAGNOSIS — R001 Bradycardia, unspecified: Secondary | ICD-10-CM

## 2019-02-24 DIAGNOSIS — Z7901 Long term (current) use of anticoagulants: Secondary | ICD-10-CM | POA: Diagnosis not present

## 2019-02-24 DIAGNOSIS — Z955 Presence of coronary angioplasty implant and graft: Secondary | ICD-10-CM | POA: Diagnosis not present

## 2019-02-24 DIAGNOSIS — I472 Ventricular tachycardia, unspecified: Secondary | ICD-10-CM

## 2019-02-24 DIAGNOSIS — Z45010 Encounter for checking and testing of cardiac pacemaker pulse generator [battery]: Secondary | ICD-10-CM | POA: Diagnosis present

## 2019-02-24 DIAGNOSIS — E785 Hyperlipidemia, unspecified: Secondary | ICD-10-CM | POA: Insufficient documentation

## 2019-02-24 DIAGNOSIS — I252 Old myocardial infarction: Secondary | ICD-10-CM | POA: Insufficient documentation

## 2019-02-24 DIAGNOSIS — Z79899 Other long term (current) drug therapy: Secondary | ICD-10-CM | POA: Insufficient documentation

## 2019-02-24 DIAGNOSIS — I25119 Atherosclerotic heart disease of native coronary artery with unspecified angina pectoris: Secondary | ICD-10-CM | POA: Insufficient documentation

## 2019-02-24 DIAGNOSIS — N183 Chronic kidney disease, stage 3 (moderate): Secondary | ICD-10-CM | POA: Insufficient documentation

## 2019-02-24 DIAGNOSIS — I25118 Atherosclerotic heart disease of native coronary artery with other forms of angina pectoris: Secondary | ICD-10-CM | POA: Diagnosis present

## 2019-02-24 DIAGNOSIS — I2510 Atherosclerotic heart disease of native coronary artery without angina pectoris: Secondary | ICD-10-CM | POA: Diagnosis present

## 2019-02-24 HISTORY — PX: ICD GENERATOR CHANGEOUT: EP1231

## 2019-02-24 LAB — SURGICAL PCR SCREEN
MRSA, PCR: NEGATIVE
Staphylococcus aureus: NEGATIVE

## 2019-02-24 LAB — PROTIME-INR
INR: 2.1 — ABNORMAL HIGH (ref 0.8–1.2)
Prothrombin Time: 23.7 seconds — ABNORMAL HIGH (ref 11.4–15.2)

## 2019-02-24 SURGERY — ICD GENERATOR CHANGEOUT

## 2019-02-24 MED ORDER — LIDOCAINE HCL (PF) 1 % IJ SOLN
INTRAMUSCULAR | Status: AC
  Filled 2019-02-24: qty 30

## 2019-02-24 MED ORDER — CEFAZOLIN SODIUM-DEXTROSE 2-3 GM-%(50ML) IV SOLR
INTRAVENOUS | Status: DC | PRN
  Administered 2019-02-24: 2 g via INTRAVENOUS

## 2019-02-24 MED ORDER — MUPIROCIN 2 % EX OINT
TOPICAL_OINTMENT | CUTANEOUS | Status: AC
  Administered 2019-02-24: 1 via TOPICAL
  Filled 2019-02-24: qty 22

## 2019-02-24 MED ORDER — SODIUM CHLORIDE 0.9 % IV SOLN
INTRAVENOUS | Status: AC
  Filled 2019-02-24: qty 2

## 2019-02-24 MED ORDER — SODIUM CHLORIDE 0.9 % IV SOLN
80.0000 mg | INTRAVENOUS | Status: AC
  Administered 2019-02-24: 16:00:00 80 mg

## 2019-02-24 MED ORDER — MUPIROCIN 2 % EX OINT
1.0000 "application " | TOPICAL_OINTMENT | Freq: Once | CUTANEOUS | Status: AC
  Administered 2019-02-24: 13:00:00 1 via TOPICAL

## 2019-02-24 MED ORDER — HEPARIN (PORCINE) IN NACL 1000-0.9 UT/500ML-% IV SOLN
INTRAVENOUS | Status: AC
  Filled 2019-02-24: qty 500

## 2019-02-24 MED ORDER — CHLORHEXIDINE GLUCONATE 4 % EX LIQD
60.0000 mL | Freq: Once | CUTANEOUS | Status: DC
  Filled 2019-02-24: qty 60

## 2019-02-24 MED ORDER — SODIUM CHLORIDE 0.9 % IV SOLN
INTRAVENOUS | Status: DC
  Administered 2019-02-24: 13:00:00 via INTRAVENOUS

## 2019-02-24 MED ORDER — ONDANSETRON HCL 4 MG/2ML IJ SOLN: 4.0000 mg | Freq: Four times a day (QID) | INTRAMUSCULAR | Status: DC | PRN

## 2019-02-24 MED ORDER — ACETAMINOPHEN 325 MG PO TABS: 325.0000 mg | ORAL_TABLET | ORAL | Status: DC | PRN

## 2019-02-24 MED ORDER — CEFAZOLIN SODIUM-DEXTROSE 2-4 GM/100ML-% IV SOLN
INTRAVENOUS | Status: AC
  Filled 2019-02-24: qty 100

## 2019-02-24 MED ORDER — ISOSORBIDE MONONITRATE ER 60 MG PO TB24: 90.0000 mg | ORAL_TABLET | Freq: Every day | ORAL | 3 refills | Status: DC

## 2019-02-24 MED ORDER — LIDOCAINE HCL (PF) 1 % IJ SOLN
INTRAMUSCULAR | Status: DC | PRN
  Administered 2019-02-24: 30 mL

## 2019-02-24 MED ORDER — CEFAZOLIN SODIUM-DEXTROSE 2-4 GM/100ML-% IV SOLN: 2.0000 g | INTRAVENOUS | Status: DC

## 2019-02-24 SURGICAL SUPPLY — 5 items
CABLE SURGICAL S-101-97-12 (CABLE) ×2 IMPLANT
ICD EVERA XT MRI DF1  DDMB1D1 (ICD Generator) ×1 IMPLANT
ICD EVERA XT MRI DF1 DDMB1D1 (ICD Generator) IMPLANT
PAD PRO RADIOLUCENT 2001M-C (PAD) ×2 IMPLANT
TRAY PACEMAKER INSERTION (PACKS) ×2 IMPLANT

## 2019-02-24 NOTE — Discharge Instructions (Signed)

## 2019-02-24 NOTE — Telephone Encounter (Signed)
Follow Up:    Did not need this encounter

## 2019-02-24 NOTE — Op Note (Signed)
Procedure report  Procedure performed:  1. Dual chamber ICD generator changeout   Reason for procedure:  1. Device generator at elective replacement interval  2. Primary prevention ICD -for ischemic cardiomyopathy (Prior myocardial infarction, left ventricular ejection fraction under 35%, heart failure NYHA class II, on comprehensive medical therapy) 3. Bradycardia due to necessary drugs   Procedure performed by:  Sanda Klein, MD  Complications:  None  Estimated blood loss:  <5 mL  Medications administered during procedure:  Ancef 2 g intravenously,  lidocaine 1% 30 mL locally Device details:   New Generator Medtronic Evera MRI XT DR model number [id number], serial number [id number] Right atrial lead (chronic) Medtronic, model number [id number], serial [id number] (implanted 03/02/2011) Right ventricular lead (chronic)  [employer] , model number [id number], serial number [id number] (implanted 03/02/2011)  Explanted generator Medtronic [place], serial number  [id number] (implanted 03/02/2011)  Procedure details:  After the risks and benefits of the procedure were discussed the patient provided informed consent. She was brought to the cardiac catheter lab in the fasting state. The patient was prepped and draped in usual sterile fashion. Local anesthesia with 1% lidocaine was administered to to the left infraclavicular area. A 5-6cm horizontal incision was made parallel with and 2-3 cm caudal to the left clavicle, in the area of an old scar. Using minimal electrocautery and mostly sharp and blunt dissection the prepectoral pocket was opened carefully to avoid injury to the loops of chronic leads. Extensive dissection was necessary. The device was explanted. The pocket was carefully inspected for hemostasis and flushed with copious amounts of antibiotic solution.  The leads were disconnected from the old generator and testing of the lead parameters later showed excellent values. The new  generator was connected to the chronic leads, with appropriate pacing noted.   The entire system was then carefully inserted in the pocket with care been taking that the leads and device assumed a comfortable position without pressure on the incision. Great care was taken that the leads be located deep to the generator. The pocket was then closed in layers using 2 layers of 2-0 Vicryl and cutaneous staples after which a sterile dressing was applied.   At the end of the procedure the following lead parameters were encountered:   Right atrial lead sensed P waves 2.5 mV, impedance 437 ohms, threshold 0.5 at 0.5 ms pulse width.  Right ventricular lead sensed R waves  7.3 mV, impedance 399 ohms, threshold 0.75 at 0.5 ms pulse width. High voltage SVC 74 ohm, RV coil 77 ohm.  Sanda Klein, MD, [facility] [phone] office [phone] pager

## 2019-02-24 NOTE — H&P (Signed)
Cardiology Admission History and Physical:   Patient ID: Steven [name] MRN: [id number]; DOB: [birth year]   Admission date: 02/24/2019  Primary Care Provider: Lorelee Market, MD Primary Cardiologist: Kelly/Elynore Dolinski Primary Electrophysiologist:  None   Chief Complaint:  ICD battery depletion  Patient Profile:   Steven [name] is a 63 y.o. male with severe ischemic cardiomyopathy, with history of VT, here for ICD generator changeout due to ERI.  History of Present Illness:   Steven [name] history of extensive anterior wall myocardial infarction 2012, DES-LAD, ischemic cardiomyopathy (most recent LVEF 20-25% November 2018 echo), chronic combined systolic and diastolic heart failure, NYHA functional class I-II, history of left ventricular apical mural thrombus on chronic warfarin anticoagulation, chronic kidney disease stage III (Dr. Graylon Gunning), hypercholesterolemia, elevated PSA.  He has developed exertional angina, improved, but not resolved after we increased his long acting nitrates 3 months ago.  He is not device dependent, but has 80% atrial pacing. His device has recorded lengthy NSVT, but has not delivered therapies. One long episode was associated with syncope, complicated by subdural hematoma that required surgery.  Heart Pathway Score:     Past Medical History:  Diagnosis Date  . Acute MI, anterior wall ([place])   . CAD (coronary artery disease)    2D ECHO, 07/13/2011 - EF <25%, LV moderatelty dilated, LA moderately dilatedLEXISCAN, 12/14/2011 - moderate-severe perfusion defect seen in the basal anteroseptal, mid anterior, apicacl anterior, apical, apical inferior, and apical lateral regions, post-stress EF 25%, new EKG changes from baseline abnormalities  . Inguinal hernia, left     Past Surgical History:  Procedure Laterality Date  . CARDIAC CATHETERIZATION  11/25/2010   Predilation balloon-Apex monorail 2x44mm, Cutting balloon-2.25x58mm, resulting in a reduction of 100% stenosis down  to less than 10%  . CARDIAC CATHETERIZATION  07/12/2010   LAD stented with a 3.5x49mm bare-metal Veriflex stent resulting in a reduction of 100% lesion to 0%  . CARDIAC DEFIBRILLATOR PLACEMENT  03/02/2011   Medtronic Protecta XT DR, model [id number], serial [id number]  . PACEMAKER PLACEMENT  07/14/2010   Temporary placement of pacemaker, if rhythm issue continues will need a permanent device     Medications Prior to Admission: Prior to Admission medications   Medication Sig Start Date End Date Taking? Authorizing Provider  albuterol (PROVENTIL HFA;VENTOLIN HFA) 108 (90 Base) MCG/ACT inhaler Inhale 1-2 puffs into the lungs every 6 (six) hours as needed for wheezing or shortness of breath.   Yes [provider]  albuterol (PROVENTIL) (2.5 MG/3ML) 0.083% nebulizer solution Take 2.5 mg by nebulization every 6 (six) hours as needed for wheezing or shortness of breath.   Yes [provider]  atorvastatin (LIPITOR) 40 MG tablet Take 1 tablet by mouth once daily Patient taking differently: Take 40 mg by mouth daily.  02/14/19  Yes Rosaisela Jamroz, MD  carvedilol (COREG) 25 MG tablet TAKE 1 TABLET BY MOUTH TWICE DAILY WITH A MEAL 02/24/19  Yes Troy Sine, MD  Cholecalciferol (VITAMIN D) 125 MCG (5000 UT) CAPS Take 5,000 Units by mouth daily.   Yes [provider]  fluticasone (FLONASE) 50 MCG/ACT nasal spray Place 2 sprays into both nostrils daily.   Yes [provider]  furosemide (LASIX) 40 MG tablet Take 1 tablet (40 mg total) by mouth daily. Patient taking differently: Take 20 mg by mouth daily.  11/20/18  Yes Wiletta Bermingham, MD  isosorbide mononitrate (IMDUR) 60 MG 24 hr tablet Take 1 tablet (60 mg total) by mouth daily. 01/27/19 04/27/19 Yes  Ambra Haverstick, MD  lisinopril (PRINIVIL,ZESTRIL) 5 MG tablet Take 1 tablet by mouth once daily Patient taking differently: Take 5 mg by mouth daily.  09/24/18  Yes Lorretta Harp, MD  montelukast (SINGULAIR) 10 MG tablet  Take 10 mg by mouth daily. 10/28/18  Yes [provider]  Multiple Vitamin (MULTIVITAMIN) tablet Take 1 tablet by mouth daily.   Yes [provider]  spironolactone (ALDACTONE) 25 MG tablet Take 1/2 (one-half) tablet by mouth twice daily Patient taking differently: Take 12.5 mg by mouth daily.  02/14/19  Yes Tahir Blank, MD  tamsulosin (FLOMAX) 0.4 MG CAPS capsule Take 0.4 mg by mouth at bedtime.   Yes [provider]  Tiotropium Bromide-Olodaterol (STIOLTO RESPIMAT) 2.5-2.5 MCG/ACT AERS Inhale 2 puffs into the lungs daily.   Yes [provider]  warfarin (COUMADIN) 5 MG tablet TAKE 1/2 TO 1 (ONE-HALF TO ONE) TABLET BY MOUTH AS DIRECTED BY  COUMADIN Patient taking differently: Take 2.5-5 mg by mouth See admin instructions. Take 5 mg by mouth on Monday, Wednesday and Friday and 2.5 mg by mouth on Tuesday, Thursday, Saturday and Sunday. 12/11/18  Yes Orren Pietsch, MD  nitroGLYCERIN (NITROSTAT) 0.4 MG SL tablet DISSOLVE ONE TABLET UNDER THE TONGUE EVERY 5 MINUTES AS NEEDED FOR CHEST PAIN.  DO NOT EXCEED A TOTAL OF 3 DOSES IN 15 MINUTES Patient taking differently: Place 0.4 mg under the tongue every 5 (five) minutes as needed for chest pain.  06/27/18   Troy Sine, MD     Allergies:   No Known Allergies  Social History:   Social History   Socioeconomic History  . Marital status: Married    Spouse name: Not on file  . Number of children: Not on file  . Years of education: Not on file  . Highest education level: Not on file  Occupational History  . Not on file  Social Needs  . Financial resource strain: Not on file  . Food insecurity    Worry: Not on file    Inability: Not on file  . Transportation needs    Medical: Not on file    Non-medical: Not on file  Tobacco Use  . Smoking status: Former Smoker    Types: Cigarettes, Cigars  . Smokeless tobacco: Never Used  Substance and Sexual Activity  . Alcohol use: Yes    Alcohol/week: 2.0 - 3.0  standard drinks    Types: 2 - 3 drink(s) per week  . Drug use: No  . Sexual activity: Not on file  Lifestyle  . Physical activity    Days per week: Not on file    Minutes per session: Not on file  . Stress: Not on file  Relationships  . Social Herbalist on phone: Not on file    Gets together: Not on file    Attends religious service: Not on file    Active member of club or organization: Not on file    Attends meetings of clubs or organizations: Not on file    Relationship status: Not on file  . Intimate partner violence    Fear of current or ex partner: Not on file    Emotionally abused: Not on file    Physically abused: Not on file    Forced sexual activity: Not on file  Other Topics Concern  . Not on file  Social History Narrative  . Not on file    Family History:   Negative for premature CV disease.  ROS:  Please see the history of present illness.  All other ROS reviewed and negative.     Physical Exam/Data:   Vitals:   02/24/19 1242 02/24/19 1245  BP: 107/78 107/78  Pulse: 68 68  Resp: 12 12  Temp: (!) 97.3 F (36.3 C) (!) 97.3 F (36.3 C)  TempSrc: Oral Oral  SpO2: 100% 100%  Weight:  83.5 kg  Height:  5\' 10"  (1.778 m)   No intake or output data in the 24 hours ending 02/24/19 1422 Last 3 Weights 02/24/2019 11/20/2018 09/26/2017  Weight (lbs) 184 lb 184 lb 191 lb  Weight (kg) 83.462 kg 83.462 kg 86.637 kg     Body mass index is 26.4 kg/m.  General:  Well nourished, well developed, in no acute distress HEENT: normal Lymph: no adenopathy Neck: no JVD Endocrine:  No thryomegaly Vascular: No carotid bruits; FA pulses 2+ bilaterally without bruits  Cardiac:  normal S1, S2; RRR; no murmur, healthy ICD site Lungs:  clear to auscultation bilaterally, no wheezing, rhonchi or rales  Abd: soft, nontender, no hepatomegaly  Ext: no edema Musculoskeletal:  No deformities, BUE and BLE strength normal and equal Skin: warm and dry  Neuro:  CNs 2-12  intact, no focal abnormalities noted Psych:  Normal affect    EKG:  The ECG that was done 09/26/2017 was personally reviewed and demonstrates A paced, V sensed rhythm, extensive anterolateral infarction (old) with precordial T wave inversion  Relevant CV Studies: ECHO 2018  - Left ventricle: The cavity size was normal. Wall thickness was   normal. Systolic function was severely reduced. The estimated   ejection fraction was in the range of 20% to 25%. Akinesis of the   apicalanteroseptal, inferolateral, and apical myocardium. Doppler   parameters are consistent with abnormal left ventricular   relaxation (grade 1 diastolic dysfunction). - Mitral valve: Mildly calcified annulus.   Laboratory Data:  High Sensitivity Troponin:  No results for input(s): TROPONINIHS in the last 720 hours.    Chemistry Recent Labs  Lab 02/19/19 0938  NA 134  K 4.5  CL 100  CO2 19*  GLUCOSE 94  BUN 18  CREATININE 1.41*  CALCIUM 9.2  GFRNONAA 52*  GFRAA 60    No results for input(s): PROT, ALBUMIN, AST, ALT, ALKPHOS, BILITOT in the last 168 hours. Hematology Recent Labs  Lab 02/19/19 0938  WBC 6.1  RBC 4.59  HGB 14.6  HCT 42.4  MCV 92  MCH 31.8  MCHC 34.4  RDW 11.7  PLT 156   BNPNo results for input(s): BNP, PROBNP in the last 168 hours.  DDimer No results for input(s): DDIMER in the last 168 hours. Lipid Panel     Component Value Date/Time   CHOL 125 02/19/2019 0938   CHOL 161 07/01/2013 0838   TRIG 112 02/19/2019 0938   TRIG 108 07/01/2013 0838   HDL 41 02/19/2019 0938   HDL 43 07/01/2013 0838   CHOLHDL 3.0 02/19/2019 0938   CHOLHDL 3.3 09/03/2014 0803   VLDL 16 09/03/2014 0803   LDLCALC 62 02/19/2019 0938   LDLCALC 96 07/01/2013 0838     Radiology/Studies:  No results found.  Assessment and Plan:   1. ICD@ERI : generator change today; This procedure has been fully reviewed with the patient and written informed consent has been obtained.; he prefers local  anesthesia only, no sedation. 2. CHF: appears clinically euvolemic, NYHA 2. 3. CAD: still has CCS class 2 angina, will increase isosorbide to 90 mg daily at  DC. Already on max dose carvedilol. BP will not allow amlodipine. Consider Ranexa.May need another cardiac catheterization if angina persists. 4. LV thrombus: INR 2.1 today. 5. HLP: LDL 62, at target.   For questions or updates, please contact [place] Please consult [email or web address] for contact info under        Signed, Sanda Klein, MD  02/24/2019 2:22 PM

## 2019-02-25 ENCOUNTER — Encounter (HOSPITAL_COMMUNITY): Payer: Self-pay | Admitting: Cardiovascular Disease

## 2019-02-25 MED FILL — Lidocaine HCl Local Preservative Free (PF) Inj 1%: INTRAMUSCULAR | Qty: 30 | Status: AC

## 2019-02-25 MED FILL — Heparin Sod (Porcine)-NaCl IV Soln 1000 Unit/500ML-0.9%: INTRAVENOUS | Qty: 500 | Status: AC

## 2019-02-26 ENCOUNTER — Other Ambulatory Visit: Payer: Self-pay | Admitting: Cardiovascular Disease

## 2019-02-27 NOTE — Telephone Encounter (Signed)
Did not need this encounter °

## 2019-02-28 ENCOUNTER — Other Ambulatory Visit: Payer: Self-pay | Admitting: Cardiovascular Disease

## 2019-03-06 ENCOUNTER — Ambulatory Visit: Payer: Medicaid Other

## 2019-03-13 ENCOUNTER — Ambulatory Visit (INDEPENDENT_AMBULATORY_CARE_PROVIDER_SITE_OTHER): Payer: Medicaid Other | Admitting: *Deleted

## 2019-03-13 ENCOUNTER — Other Ambulatory Visit: Payer: Self-pay

## 2019-03-13 DIAGNOSIS — Z95810 Presence of automatic (implantable) cardiac defibrillator: Secondary | ICD-10-CM | POA: Diagnosis not present

## 2019-03-13 DIAGNOSIS — I472 Ventricular tachycardia, unspecified: Secondary | ICD-10-CM

## 2019-03-13 DIAGNOSIS — I255 Ischemic cardiomyopathy: Secondary | ICD-10-CM

## 2019-03-13 LAB — CUP PACEART INCLINIC DEVICE CHECK
Battery Remaining Longevity: 122 mo
Battery Voltage: 3.14 V
Brady Statistic AP VP Percent: 0.13 %
Brady Statistic AP VS Percent: 72.35 %
Brady Statistic AS VP Percent: 0.03 %
Brady Statistic AS VS Percent: 27.49 %
Brady Statistic RA Percent Paced: 71.94 %
Brady Statistic RV Percent Paced: 0.16 %
Date Time Interrogation Session: 20200917091841
HighPow Impedance: 41 Ohm
HighPow Impedance: 54 Ohm
Implantable Lead Implant Date: 20120906
Implantable Lead Implant Date: 20120906
Implantable Lead Location: 753859
Implantable Lead Location: 753860
Implantable Lead Model: 185
Implantable Lead Model: 5076
Implantable Lead Serial Number: 358872
Implantable Pulse Generator Implant Date: 20200831
Lead Channel Impedance Value: 342 Ohm
Lead Channel Impedance Value: 342 Ohm
Lead Channel Impedance Value: 380 Ohm
Lead Channel Pacing Threshold Amplitude: 0.5 V
Lead Channel Pacing Threshold Amplitude: 0.75 V
Lead Channel Pacing Threshold Pulse Width: 0.4 ms
Lead Channel Pacing Threshold Pulse Width: 0.4 ms
Lead Channel Sensing Intrinsic Amplitude: 1.75 mV
Lead Channel Sensing Intrinsic Amplitude: 6.125 mV
Lead Channel Setting Pacing Amplitude: 1.5 V
Lead Channel Setting Pacing Amplitude: 2.5 V
Lead Channel Setting Pacing Pulse Width: 0.4 ms
Lead Channel Setting Sensing Sensitivity: 0.3 mV

## 2019-03-13 NOTE — Progress Notes (Signed)
Wound check appointment, s/p gen change on 02/24/19. Steri-strips removed. Wound without redness or edema. Incision edges approximated, wound well healed. Normal device function. Thresholds, sensing, and impedances consistent with implant measurements. Device programmed at chronic outputs. Histogram distribution appropriate for patient and level of activity. No mode switches or ventricular arrhythmias noted. Patient educated about wound care, arm mobility, shock plan, and Carelink monitor. ROV with [place] on 05/26/19.

## 2019-03-23 ENCOUNTER — Encounter

## 2019-03-25 ENCOUNTER — Other Ambulatory Visit: Payer: Self-pay | Admitting: Cardiovascular Disease

## 2019-03-31 ENCOUNTER — Ambulatory Visit (INDEPENDENT_AMBULATORY_CARE_PROVIDER_SITE_OTHER): Payer: Medicaid Other | Admitting: Pharmacist

## 2019-03-31 ENCOUNTER — Other Ambulatory Visit: Payer: Self-pay

## 2019-03-31 DIAGNOSIS — I513 Intracardiac thrombosis, not elsewhere classified: Secondary | ICD-10-CM

## 2019-03-31 DIAGNOSIS — Z7901 Long term (current) use of anticoagulants: Secondary | ICD-10-CM | POA: Diagnosis not present

## 2019-03-31 LAB — POCT INR: INR: 2.1 (ref 2.0–3.0)

## 2019-05-26 ENCOUNTER — Ambulatory Visit (INDEPENDENT_AMBULATORY_CARE_PROVIDER_SITE_OTHER): Payer: Medicaid Other | Admitting: Pharmacist Clinician (PhC)/ Clinical Pharmacy Specialist

## 2019-05-26 ENCOUNTER — Ambulatory Visit: Payer: Medicaid Other | Admitting: Cardiovascular Disease

## 2019-05-26 ENCOUNTER — Other Ambulatory Visit: Payer: Self-pay

## 2019-05-26 ENCOUNTER — Encounter: Payer: Self-pay | Admitting: Cardiovascular Disease

## 2019-05-26 VITALS — BP 111/74 | HR 79 | Temp 97.0°F | Ht 70.0 in | Wt 185.6 lb

## 2019-05-26 DIAGNOSIS — I25118 Atherosclerotic heart disease of native coronary artery with other forms of angina pectoris: Secondary | ICD-10-CM | POA: Diagnosis not present

## 2019-05-26 DIAGNOSIS — E7800 Pure hypercholesterolemia, unspecified: Secondary | ICD-10-CM

## 2019-05-26 DIAGNOSIS — I5042 Chronic combined systolic (congestive) and diastolic (congestive) heart failure: Secondary | ICD-10-CM | POA: Diagnosis not present

## 2019-05-26 DIAGNOSIS — I513 Intracardiac thrombosis, not elsewhere classified: Secondary | ICD-10-CM | POA: Diagnosis not present

## 2019-05-26 DIAGNOSIS — Z7901 Long term (current) use of anticoagulants: Secondary | ICD-10-CM

## 2019-05-26 DIAGNOSIS — I472 Ventricular tachycardia, unspecified: Secondary | ICD-10-CM

## 2019-05-26 DIAGNOSIS — Z95810 Presence of automatic (implantable) cardiac defibrillator: Secondary | ICD-10-CM | POA: Diagnosis not present

## 2019-05-26 DIAGNOSIS — I236 Thrombosis of atrium, auricular appendage, and ventricle as current complications following acute myocardial infarction: Secondary | ICD-10-CM

## 2019-05-26 LAB — POCT INR: INR: 2.3 (ref 2.0–3.0)

## 2019-05-26 MED ORDER — ISOSORBIDE MONONITRATE ER 120 MG PO TB24: 120.0000 mg | ORAL_TABLET | Freq: Every day | ORAL | 3 refills | Status: DC

## 2019-05-26 NOTE — Patient Instructions (Addendum)
Medication Instructions:  INCREASE the Isosorbide (Imdur) to 120 mg (one tablet) once daily  *If you need a refill on your cardiac medications before your next appointment, please call your pharmacy*  Lab Work: None ordered If you have labs (blood work) drawn today and your tests are completely normal, you will receive your results only by: [name] MyChart Message (if you have MyChart) OR . A paper copy in the mail If you have any lab test that is abnormal or we need to change your treatment, we will call you to review the results.  Testing/Procedures: None ordered  Follow-Up: At [facility], you and your health needs are our priority.  As part of our continuing mission to provide you with exceptional heart care, we have created designated Provider Care Teams.  These Care Teams include your primary Cardiologist (physician) and Advanced Practice Providers (APPs -  Physician Assistants and Nurse Practitioners) who all work together to provide you with the care you need, when you need it.  Your next appointment:   Follow up with Dr. Claiborne Billings in April on 2021 Follow up with Dr. Sallyanne Kuster in 12 months

## 2019-05-26 NOTE — Progress Notes (Signed)
Patient ID: Steven [name], male   DOB: 1955-07-04, 63 y.o.   MRN: [id number]    Cardiology Office Note    Date:  05/26/2019   ID:  [name], Mortel [birth year], MRN [id number]  PCP:  Lorelee Market, MD  Cardiologist:  Shelva Majestic, M.D.; Sanda Klein, MD   Chief Complaint  Patient presents with  . Congestive Heart Failure  . Coronary Artery Disease  . Pacemaker Check    ICD    History of Present Illness:  Steven [name] is a 63 y.o. male with severe ischemic cardiomyopathy following an extensive infarction in the LAD artery territory complicated by severe left ventricular systolic dysfunction, left ventricular apical thrombus and ventricular tachycardia. He received a defibrillator for primary prevention in 2012 (Medtronic generator change 01/2019). He has never received device therapy to date, but his device has recorded fairly lengthy episodes of nonsustained ventricular tachycardia around the time when he suffered syncope and a subdural hematoma that required surgical evacuation.  He was complaining of some mild exertional chest discomfort at his last appointment and we increased his isosorbide.  This led to improvement, the angina spells are less frequent and occur at higher exercise thresholds, but have not resolved.  Symptoms promptly resolve after 10-15 minutes or rest.  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  The patient specifically denies any chest pain at rest, dyspnea at rest or with exertion, orthopnea, paroxysmal nocturnal dyspnea, syncope, palpitations, focal neurological deficits, intermittent claudication, lower extremity edema, unexplained weight gain, cough, hemoptysis or wheezing.  Echo performed in October 2018 showed EF 20-25%, no evidence of fluid overload, no evidence of left ventricular thrombus.  He has a Medtronic Evera XT DR defibrillator: his device shows roughly 81 % atrial pacing and no ventricular  pacing.  Estimated generator longevity is 9.8 years.  Since the generator change has not had any episodes of nonsustained ventricular tachycardia.  His OptiVol is now mature and shows steady thoracic impedance levels.  Prior to the generator change out he typically had about 1 episode of nonsustained VT monthly.  Heart rate histograms appear appropriate.  Lead parameters are all excellent.  Past Medical History:  Diagnosis Date  . Acute MI, anterior wall ([place])   . CAD (coronary artery disease)    2D ECHO, 07/13/2011 - EF <25%, LV moderatelty dilated, LA moderately dilatedLEXISCAN, 12/14/2011 - moderate-severe perfusion defect seen in the basal anteroseptal, mid anterior, apicacl anterior, apical, apical inferior, and apical lateral regions, post-stress EF 25%, new EKG changes from baseline abnormalities  . Inguinal hernia, left     Past Surgical History:  Procedure Laterality Date  . CARDIAC CATHETERIZATION  11/25/2010   Predilation balloon-Apex monorail 2x16mm, Cutting balloon-2.25x93mm, resulting in a reduction of 100% stenosis down to less than 10%  . CARDIAC CATHETERIZATION  07/12/2010   LAD stented with a 3.5x65mm bare-metal Veriflex stent resulting in a reduction of 100% lesion to 0%  . CARDIAC DEFIBRILLATOR PLACEMENT  03/02/2011   Medtronic Protecta XT DR, model [id number], serial [id number]  . ICD GENERATOR CHANGEOUT N/A 02/24/2019   Procedure: ICD GENERATOR CHANGEOUT;  Surgeon: Sanda Klein, MD;  Location: [facility];  Service: Cardiovascular;  Laterality: N/A;  . PACEMAKER PLACEMENT  07/14/2010   Temporary placement of pacemaker, if rhythm issue continues will need a permanent device    Outpatient Medications Prior to Visit  Medication Sig Dispense Refill  . albuterol (PROVENTIL HFA;VENTOLIN HFA)  108 (90 Base) MCG/ACT inhaler Inhale 1-2 puffs into the lungs every 6 (six) hours as needed for wheezing or shortness of breath.    [name] albuterol (PROVENTIL) (2.5 MG/3ML) 0.083% nebulizer  solution Take 2.5 mg by nebulization every 6 (six) hours as needed for wheezing or shortness of breath.    [name] atorvastatin (LIPITOR) 40 MG tablet Take 1 tablet by mouth once daily (Patient taking differently: Take 40 mg by mouth daily. ) 90 tablet 1  . carvedilol (COREG) 25 MG tablet TAKE 1 TABLET BY MOUTH TWICE DAILY WITH A MEAL 180 tablet 2  . Cholecalciferol (VITAMIN D) 125 MCG (5000 UT) CAPS Take 5,000 Units by mouth daily.    . fluticasone (FLONASE) 50 MCG/ACT nasal spray Place 2 sprays into both nostrils daily.    . furosemide (LASIX) 40 MG tablet Take 1 tablet (40 mg total) by mouth daily. (Patient taking differently: Take 20 mg by mouth daily. ) 30 tablet 6  . lisinopril (ZESTRIL) 5 MG tablet Take 1 tablet by mouth once daily 90 tablet 0  . montelukast (SINGULAIR) 10 MG tablet Take 10 mg by mouth daily.    . Multiple Vitamin (MULTIVITAMIN) tablet Take 1 tablet by mouth daily.    . nitroGLYCERIN (NITROSTAT) 0.4 MG SL tablet DISSOLVE ONE TABLET UNDER THE TONGUE EVERY 5 MINUTES AS NEEDED FOR CHEST PAIN.  DO NOT EXCEED A TOTAL OF 3 DOSES IN 15 MINUTES (Patient taking differently: Place 0.4 mg under the tongue every 5 (five) minutes as needed for chest pain. ) 25 tablet 1  . spironolactone (ALDACTONE) 25 MG tablet Take 1/2 (one-half) tablet by mouth twice daily (Patient taking differently: Take 12.5 mg by mouth daily. ) 30 tablet 1  . tamsulosin (FLOMAX) 0.4 MG CAPS capsule Take 0.4 mg by mouth at bedtime.    . Tiotropium Bromide-Olodaterol (STIOLTO RESPIMAT) 2.5-2.5 MCG/ACT AERS Inhale 2 puffs into the lungs daily.    [name] warfarin (COUMADIN) 5 MG tablet TAKE 1/2 TO 1 (ONE-HALF TO ONE) TABLET BY MOUTH AS DIRECTED BY  COUMADIN (Patient taking differently: Take 2.5-5 mg by mouth See admin instructions. Take 5 mg by mouth on Monday, Wednesday and Friday and 2.5 mg by mouth on Tuesday, Thursday, Saturday and Sunday.) 90 tablet 1  . isosorbide mononitrate (IMDUR) 60 MG 24 hr tablet Take 1.5 tablets (90 mg  total) by mouth daily. 135 tablet 3   No facility-administered medications prior to visit.      Allergies:   Patient has no known allergies.   Social History   Socioeconomic History  . Marital status: Married    Spouse name: Not on file  . Number of children: Not on file  . Years of education: Not on file  . Highest education level: Not on file  Occupational History  . Not on file  Social Needs  . Financial resource strain: Not on file  . Food insecurity    Worry: Not on file    Inability: Not on file  . Transportation needs    Medical: Not on file    Non-medical: Not on file  Tobacco Use  . Smoking status: Former Smoker    Types: Cigarettes, Cigars  . Smokeless tobacco: Never Used  Substance and Sexual Activity  . Alcohol use: Yes    Alcohol/week: 2.0 - 3.0 standard drinks    Types: 2 - 3 drink(s) per week  . Drug use: No  . Sexual activity: Not on file  Lifestyle  . Physical activity  Days per week: Not on file    Minutes per session: Not on file  . Stress: Not on file  Relationships  . Social Herbalist on phone: Not on file    Gets together: Not on file    Attends religious service: Not on file    Active member of club or organization: Not on file    Attends meetings of clubs or organizations: Not on file    Relationship status: Not on file  Other Topics Concern  . Not on file  Social History Narrative  . Not on file     ROS:   Please see the history of present illness.    ROS All other systems reviewed and are negative.   PHYSICAL EXAM:   VS:  BP 111/74   Pulse 79   Temp (!) 97 F (36.1 C)   Ht 5\' 10"  (1.778 m)   Wt 185 lb 9.6 oz (84.2 kg)   SpO2 97%   BMI 26.63 kg/m      General: Alert, oriented x3, no distress, healthy, subclavian defibrillator site Head: no evidence of trauma, PERRL, EOMI, no exophtalmos or lid lag, no myxedema, no xanthelasma; normal ears, nose and oropharynx Neck: normal jugular venous pulsations and no  hepatojugular reflux; brisk carotid pulses without delay and no carotid bruits Chest: clear to auscultation, no signs of consolidation by percussion or palpation, normal fremitus, symmetrical and full respiratory excursions Cardiovascular: normal position and quality of the apical impulse, regular rhythm, normal first and second heart sounds, no murmurs, rubs or gallops Abdomen: no tenderness or distention, no masses by palpation, no abnormal pulsatility or arterial bruits, normal bowel sounds, no hepatosplenomegaly Extremities: no clubbing, cyanosis or edema; 2+ radial, ulnar and brachial pulses bilaterally; 2+ right femoral, posterior tibial and dorsalis pedis pulses; 2+ left femoral, posterior tibial and dorsalis pedis pulses; no subclavian or femoral bruits Neurological: grossly nonfocal Psych: Normal mood and affect   Wt Readings from Last 3 Encounters:  05/26/19 185 lb 9.6 oz (84.2 kg)  02/24/19 184 lb (83.5 kg)  11/20/18 184 lb (83.5 kg)      Studies/Labs Reviewed:   EKG:  EKG is ordered today.  It shows atrial paced, ventricular sensed rhythm with old anterior myocardial infarction, T wave inversion in leads V4-V6 and aVL.  Normal QTC 430 ms  Lipid Panel    Component Value Date/Time   CHOL 125 02/19/2019 0938   CHOL 161 07/01/2013 0838   TRIG 112 02/19/2019 0938   TRIG 108 07/01/2013 0838   HDL 41 02/19/2019 0938   HDL 43 07/01/2013 0838   CHOLHDL 3.0 02/19/2019 0938   CHOLHDL 3.3 09/03/2014 0803   VLDL 16 09/03/2014 0803   LDLCALC 62 02/19/2019 0938   LDLCALC 96 07/01/2013 0838    ASSESSMENT:    1. Chronic combined systolic (congestive) and diastolic (congestive) heart failure ([place])   2. Coronary artery disease of native artery of native heart with stable angina pectoris ([place])   3. VT (ventricular tachycardia) ([place])   4. Implantable cardioverter-defibrillator (ICD) in situ   5. Left ventricular thrombosis following MI ([place])   6. Hypercholesterolemia      PLAN:   In order of problems listed above:  1. CHF: NYHA functional class I-2, euvolemic clinically and by OptiVol measurements.  He does not appear to complain of orthostatic dizziness anymore.  On appropriate heart failure medications and maximally tolerated doses. 2. CAD: CCS functional class II.  Increase isosorbide mononitrate  to 120 mg daily.  3. VT: None recorded since generator change out in the past he had symptomatic nonsustained VT that may have led to his fall and head injury. 4. ICD: Recent generator change out.  Surgical site is well-healed.  Normal device function, continue remote downloads every 3 months via CareLink. 5. LV thrombus: Not seen on most recent echo, remains on warfarin anticoagulation.  No serious bleeding problems.  INR checked today. 6. HLP: On statin, LDL at target on recent labs.   Medication Adjustments/Labs and Tests Ordered: Current medicines are reviewed at length with the patient today.  Concerns regarding medicines are outlined above.  Medication changes, Labs and Tests ordered today are listed in the Patient Instructions below. Patient Instructions  Medication Instructions:  INCREASE the Isosorbide (Imdur) to 120 mg (one tablet) once daily  *If you need a refill on your cardiac medications before your next appointment, please call your pharmacy*  Lab Work: None ordered If you have labs (blood work) drawn today and your tests are completely normal, you will receive your results only by: [name] MyChart Message (if you have MyChart) OR . A paper copy in the mail If you have any lab test that is abnormal or we need to change your treatment, we will call you to review the results.  Testing/Procedures: None ordered  Follow-Up: At [facility], you and your health needs are our priority.  As part of our continuing mission to provide you with exceptional heart care, we have created designated Provider Care Teams.  These Care Teams include your primary Cardiologist  (physician) and Advanced Practice Providers (APPs -  Physician Assistants and Nurse Practitioners) who all work together to provide you with the care you need, when you need it.  Your next appointment:   Follow up with Dr. Claiborne Billings in April on 2021 Follow up with Dr. Sallyanne Kuster in 12 months      Signed, Sanda Klein, MD  05/26/2019 9:51 AM    [address], [place], [place]  [address] Phone: [phone]; Fax: [phone]

## 2019-06-23 ENCOUNTER — Other Ambulatory Visit: Payer: Self-pay | Admitting: Cardiovascular Disease

## 2019-07-07 ENCOUNTER — Other Ambulatory Visit: Payer: Self-pay

## 2019-07-07 ENCOUNTER — Encounter: Payer: Self-pay | Admitting: Pharmacist Clinician (PhC)/ Clinical Pharmacy Specialist

## 2019-07-07 ENCOUNTER — Encounter (INDEPENDENT_AMBULATORY_CARE_PROVIDER_SITE_OTHER): Payer: Self-pay

## 2019-07-07 ENCOUNTER — Ambulatory Visit (INDEPENDENT_AMBULATORY_CARE_PROVIDER_SITE_OTHER): Payer: Medicaid Other | Admitting: Pharmacist Clinician (PhC)/ Clinical Pharmacy Specialist

## 2019-07-07 DIAGNOSIS — Z7901 Long term (current) use of anticoagulants: Secondary | ICD-10-CM

## 2019-07-07 DIAGNOSIS — I513 Intracardiac thrombosis, not elsewhere classified: Secondary | ICD-10-CM | POA: Diagnosis not present

## 2019-07-07 LAB — POCT INR: INR: 1.9 — AB (ref 2.0–3.0)

## 2019-08-11 ENCOUNTER — Other Ambulatory Visit: Payer: Self-pay | Admitting: Cardiovascular Disease

## 2019-08-13 ENCOUNTER — Other Ambulatory Visit: Payer: Self-pay | Admitting: Cardiovascular Disease

## 2019-08-18 ENCOUNTER — Encounter (INDEPENDENT_AMBULATORY_CARE_PROVIDER_SITE_OTHER): Payer: Self-pay

## 2019-08-18 ENCOUNTER — Other Ambulatory Visit: Payer: Self-pay

## 2019-08-18 ENCOUNTER — Ambulatory Visit (INDEPENDENT_AMBULATORY_CARE_PROVIDER_SITE_OTHER): Payer: Medicaid Other | Admitting: Pharmacist

## 2019-08-18 DIAGNOSIS — I513 Intracardiac thrombosis, not elsewhere classified: Secondary | ICD-10-CM

## 2019-08-18 DIAGNOSIS — Z7901 Long term (current) use of anticoagulants: Secondary | ICD-10-CM

## 2019-08-18 LAB — POCT INR: INR: 2.6 (ref 2.0–3.0)

## 2019-08-25 ENCOUNTER — Ambulatory Visit (INDEPENDENT_AMBULATORY_CARE_PROVIDER_SITE_OTHER): Payer: Medicaid Other | Admitting: *Deleted

## 2019-08-25 DIAGNOSIS — I472 Ventricular tachycardia, unspecified: Secondary | ICD-10-CM

## 2019-08-25 LAB — CUP PACEART REMOTE DEVICE CHECK
Battery Remaining Longevity: 117 mo
Battery Voltage: 3.06 V
Brady Statistic AP VP Percent: 0.02 %
Brady Statistic AP VS Percent: 80.65 %
Brady Statistic AS VP Percent: 0.01 %
Brady Statistic AS VS Percent: 19.31 %
Brady Statistic RA Percent Paced: 80.45 %
Brady Statistic RV Percent Paced: 0.03 %
Date Time Interrogation Session: 20210301001705
HighPow Impedance: 48 Ohm
HighPow Impedance: 66 Ohm
Implantable Lead Implant Date: 20120906
Implantable Lead Implant Date: 20120906
Implantable Lead Location: 753859
Implantable Lead Location: 753860
Implantable Lead Model: 185
Implantable Lead Model: 5076
Implantable Lead Serial Number: 358872
Implantable Pulse Generator Implant Date: 20200831
Lead Channel Impedance Value: 342 Ohm
Lead Channel Impedance Value: 380 Ohm
Lead Channel Impedance Value: 399 Ohm
Lead Channel Pacing Threshold Amplitude: 0.5 V
Lead Channel Pacing Threshold Amplitude: 0.75 V
Lead Channel Pacing Threshold Pulse Width: 0.4 ms
Lead Channel Pacing Threshold Pulse Width: 0.4 ms
Lead Channel Sensing Intrinsic Amplitude: 1.75 mV
Lead Channel Sensing Intrinsic Amplitude: 1.75 mV
Lead Channel Sensing Intrinsic Amplitude: 6.125 mV
Lead Channel Sensing Intrinsic Amplitude: 6.125 mV
Lead Channel Setting Pacing Amplitude: 1.5 V
Lead Channel Setting Pacing Amplitude: 2.5 V
Lead Channel Setting Pacing Pulse Width: 0.4 ms
Lead Channel Setting Sensing Sensitivity: 0.3 mV

## 2019-08-25 NOTE — Progress Notes (Signed)
ICD Remote  

## 2019-09-08 ENCOUNTER — Telehealth: Payer: Self-pay

## 2019-09-08 NOTE — Telephone Encounter (Signed)
   [facility] Pre-operative Risk Assessment    Request for surgical clearance:  1. What type of surgery is being performed? Prostate Biopsy    2. When is this surgery scheduled? TBD   3. What type of clearance is required (medical clearance vs. Pharmacy clearance to hold med vs. Both)? Both  4. Are there any medications that need to be held prior to surgery and how long?  Warfarin (asking for 5 days prior)   5. Practice name and name of physician performing surgery? [facility]- Dr. Junious Silk   6. What is your office phone number [phone]    7.   What is your office fax number [phone] Attn: Jacquelynn Cree, CMA  8.   Anesthesia type (None, local, MAC, general) ? Local   (they also requested that PT/INR be checked)   Ena Dawley 09/08/2019, 4:41 PM  _________________________________________________________________   (provider comments below)

## 2019-09-09 NOTE — Telephone Encounter (Addendum)
Patient with diagnosis of single LVT following MI (2015) on warfarin for anticoagulation. Discontinuation of warfarin has been discussed previously however patient has extensive wall motion abnormality which predisposes him to future thrombus.   Procedure: prostate biopsy Date of procedure: TBD  CrCl 58 Platelet count 156  Per Dr. Lyndal Rainbow request, patient can hold warfarin for 5 days prior to procedure.    Will confirm with Dr. Sallyanne Kuster that patient does not need bridging with Lovenox (enoxaparin) around procedure.  Reviewed by  Ramond Dial, Pharm.D, BCPS, CPP [place]  [id number] [place], [place] [address]  Phone: [phone]; Fax: [phone]

## 2019-09-09 NOTE — Telephone Encounter (Signed)
Yes. OK to hold warfarin for 5 days before prostate biopsy, without heparin "bridge". Thanks.

## 2019-09-18 ENCOUNTER — Other Ambulatory Visit: Payer: Self-pay | Admitting: Cardiovascular Disease

## 2019-09-18 NOTE — Telephone Encounter (Signed)
Rx(s) sent to pharmacy electronically.  

## 2019-09-29 ENCOUNTER — Other Ambulatory Visit: Payer: Self-pay

## 2019-09-29 ENCOUNTER — Ambulatory Visit (INDEPENDENT_AMBULATORY_CARE_PROVIDER_SITE_OTHER): Payer: Medicaid Other | Admitting: Pharmacist

## 2019-09-29 DIAGNOSIS — Z7901 Long term (current) use of anticoagulants: Secondary | ICD-10-CM | POA: Diagnosis not present

## 2019-09-29 DIAGNOSIS — I513 Intracardiac thrombosis, not elsewhere classified: Secondary | ICD-10-CM | POA: Diagnosis not present

## 2019-09-29 LAB — POCT INR: INR: 1.8 — AB (ref 2.0–3.0)

## 2019-10-27 ENCOUNTER — Ambulatory Visit (INDEPENDENT_AMBULATORY_CARE_PROVIDER_SITE_OTHER): Payer: Medicaid Other | Admitting: Pharmacist

## 2019-10-27 ENCOUNTER — Other Ambulatory Visit: Payer: Self-pay

## 2019-10-27 DIAGNOSIS — Z7901 Long term (current) use of anticoagulants: Secondary | ICD-10-CM

## 2019-10-27 DIAGNOSIS — I513 Intracardiac thrombosis, not elsewhere classified: Secondary | ICD-10-CM

## 2019-10-27 LAB — POCT INR: INR: 2.1 (ref 2.0–3.0)

## 2019-10-27 NOTE — Patient Instructions (Signed)
Continue 1/2 tablet daily except 1 tablet each Monday, Wednesday and Friday. HOLD x 5 days prior to biopsy in June/7 (see calendar for details) Repeat INR 2 weeks after procedure.

## 2019-11-14 NOTE — Telephone Encounter (Signed)
Procedure was moved to 12/15/19. Lauren with [facility] would like to know if the patient is having his INR checked prior to the procedure. Please advise.

## 2019-11-17 NOTE — Telephone Encounter (Signed)
I will forward clearance notes to [facility] in regards to INR. Pt's surgery has been rescheduled.

## 2019-11-18 NOTE — Telephone Encounter (Signed)
Patient requests call Thursday between 9am and 11am to discuss c instructions prior to surgery and new appointment for INR check after procedure.

## 2019-11-18 NOTE — Telephone Encounter (Signed)
Patient was instructed to HOLD warfarin 5 days prior to procedure without need to bridge; therefore no INR check needed from the anticoagulation stand point.   We can check INR prior to procedure if instructed by urologist. Next INR scheduled for 2 weeks after procedure.

## 2019-11-21 NOTE — Telephone Encounter (Signed)
   Primary Cardiologist: Shelva Majestic, MD  Chart reviewed as part of pre-operative protocol coverage. Patient was contacted 11/21/2019 in reference to pre-operative risk assessment for pending surgery as outlined below.  Muhammad P Radman was last seen on 05/26/2019 by Dr. Sallyanne Kuster.  Since that day, Yaman P Weigelt has had some intermittent chest pain. He has an upcoming visit with Dr. Claiborne Billings 12/04/19, therefore feel his preoperative status would be best addressed during his visit at that time.   Per pharmacy and Dr. Victorino December recommendations, patient can hold coumadin 5 days prior to his procedure with plans to meet with the coumadin clinic 12/08/19 to discuss this further and follow-up 2 weeks after his surgery for close monitoring.   Pre-op covering staff: - Please contact requesting surgeon's office via preferred method (i.e, phone, fax) to inform them of need for appointment prior to surgery.  Abigail Butts, PA-C 11/21/2019, 9:22 AM

## 2019-11-21 NOTE — Telephone Encounter (Signed)
Patient scheduled for INR 1 week prior to  procedure and 2 weeks after procedure.

## 2019-11-21 NOTE — Telephone Encounter (Signed)
Pt has appt 12/04/19 with Dr. Claiborne Billings. Will add notes to appt pt will need pre op clearance. I will forward notes to MD for upcoming appt. I will remove from the pre op call back pool.

## 2019-11-25 ENCOUNTER — Ambulatory Visit (INDEPENDENT_AMBULATORY_CARE_PROVIDER_SITE_OTHER): Payer: Medicaid Other | Admitting: *Deleted

## 2019-11-25 DIAGNOSIS — I472 Ventricular tachycardia, unspecified: Secondary | ICD-10-CM

## 2019-11-25 DIAGNOSIS — I255 Ischemic cardiomyopathy: Secondary | ICD-10-CM

## 2019-11-27 LAB — CUP PACEART REMOTE DEVICE CHECK
Battery Remaining Longevity: 114 mo
Battery Voltage: 3.04 V
Brady Statistic AP VP Percent: 0.02 %
Brady Statistic AP VS Percent: 86.41 %
Brady Statistic AS VP Percent: 0 %
Brady Statistic AS VS Percent: 13.57 %
Brady Statistic RA Percent Paced: 86.05 %
Brady Statistic RV Percent Paced: 0.02 %
Date Time Interrogation Session: 20210603084526
HighPow Impedance: 47 Ohm
HighPow Impedance: 64 Ohm
Implantable Lead Implant Date: 20120906
Implantable Lead Implant Date: 20120906
Implantable Lead Location: 753859
Implantable Lead Location: 753860
Implantable Lead Model: 185
Implantable Lead Model: 5076
Implantable Lead Serial Number: 358872
Implantable Pulse Generator Implant Date: 20200831
Lead Channel Impedance Value: 342 Ohm
Lead Channel Impedance Value: 380 Ohm
Lead Channel Impedance Value: 399 Ohm
Lead Channel Pacing Threshold Amplitude: 0.5 V
Lead Channel Pacing Threshold Amplitude: 0.625 V
Lead Channel Pacing Threshold Pulse Width: 0.4 ms
Lead Channel Pacing Threshold Pulse Width: 0.4 ms
Lead Channel Sensing Intrinsic Amplitude: 1.625 mV
Lead Channel Sensing Intrinsic Amplitude: 1.625 mV
Lead Channel Sensing Intrinsic Amplitude: 5.375 mV
Lead Channel Sensing Intrinsic Amplitude: 5.375 mV
Lead Channel Setting Pacing Amplitude: 1.5 V
Lead Channel Setting Pacing Amplitude: 2.5 V
Lead Channel Setting Pacing Pulse Width: 0.4 ms
Lead Channel Setting Sensing Sensitivity: 0.3 mV

## 2019-12-01 NOTE — Progress Notes (Signed)
Remote ICD transmission.   

## 2019-12-04 ENCOUNTER — Encounter: Payer: Self-pay | Admitting: Cardiovascular Disease

## 2019-12-04 ENCOUNTER — Telehealth (INDEPENDENT_AMBULATORY_CARE_PROVIDER_SITE_OTHER): Payer: Medicaid Other | Admitting: Cardiovascular Disease

## 2019-12-04 VITALS — Ht 71.0 in | Wt 178.0 lb

## 2019-12-04 DIAGNOSIS — I252 Old myocardial infarction: Secondary | ICD-10-CM

## 2019-12-04 DIAGNOSIS — I472 Ventricular tachycardia, unspecified: Secondary | ICD-10-CM

## 2019-12-04 DIAGNOSIS — I25118 Atherosclerotic heart disease of native coronary artery with other forms of angina pectoris: Secondary | ICD-10-CM

## 2019-12-04 DIAGNOSIS — I513 Intracardiac thrombosis, not elsewhere classified: Secondary | ICD-10-CM

## 2019-12-04 DIAGNOSIS — I255 Ischemic cardiomyopathy: Secondary | ICD-10-CM | POA: Diagnosis not present

## 2019-12-04 DIAGNOSIS — Z7901 Long term (current) use of anticoagulants: Secondary | ICD-10-CM

## 2019-12-04 DIAGNOSIS — R972 Elevated prostate specific antigen [PSA]: Secondary | ICD-10-CM

## 2019-12-04 DIAGNOSIS — I5042 Chronic combined systolic (congestive) and diastolic (congestive) heart failure: Secondary | ICD-10-CM

## 2019-12-04 DIAGNOSIS — E785 Hyperlipidemia, unspecified: Secondary | ICD-10-CM

## 2019-12-04 MED ORDER — AMLODIPINE BESYLATE 2.5 MG PO TABS
2.5000 mg | ORAL_TABLET | Freq: Every day | ORAL | 11 refills | Status: DC
Start: 2019-12-04 — End: 2020-09-13

## 2019-12-04 MED ORDER — ISOSORBIDE MONONITRATE ER 120 MG PO TB24: 120.0000 mg | ORAL_TABLET | Freq: Every day | ORAL | 3 refills | Status: DC

## 2019-12-04 NOTE — Progress Notes (Signed)
Virtual Visit via Telephone Note   This visit type was conducted due to national recommendations for restrictions regarding the COVID-19 Pandemic (e.g. social distancing) in an effort to limit this patient's exposure and mitigate transmission in our community.  Due to his co-morbid illnesses, this patient is at least at moderate risk for complications without adequate follow up.  This format is felt to be most appropriate for this patient at this time.  The patient did not have access to video technology/had technical difficulties with video requiring transitioning to audio format only (telephone).  All issues noted in this document were discussed and addressed.  No physical exam could be performed with this format.  Please refer to the patient's chart for his  consent to telehealth for [facility].   The patient was identified using 2 identifiers.  Date:  12/04/2019   ID:  Steven [name], Steven [name] 1956-03-01, MRN [id number]  Patient Location: Home Provider Location: Home  PCP:  Lorelee Market, MD  Cardiologist:  Shelva Majestic, MD , Dr.Croituru (ICD) Electrophysiologist:  None   Evaluation Performed:  Follow-Up Visit  Chief Complaint:  26 month F/U  History of Present Illness:    Steven [name] is a 64 y.o. male with documented severe ischemic cardiomyopathy following an extensive anterior myocardial infarction with subsequent defibrillator for primary prevention.  I have not seen him since 2019.  He presents for 35-month follow-up evaluation with me.  Steven [name] is originally from [place] and suffered a large out of hospital anterior wall myocardial infarction in January 2012. [name]  He presented after a three-hour delay to the hospital and was taken emergently to the cardiac catheterization laboratory where I performed successful emergent intervention with the door to balloon time of only 25 minutes. His LAD was successfully recanalized. CPK increased to 8012 with an MB of 716 and troponin was  greater than 100 initially. He subsequently wore a life vest and ultimately after several months without significant improvement of LV function he underwent implantable cardio defibrillator insertion.  Interrogation of his defibrillator in the past had shown episodes of nonsustained VT and his dose of carvedilol has been further titrated.  A followup echo Doppler study on 08/04/2013 showed an ejection fraction of 20-25% with diffuse hypokinesis but there was akinesis of the entire anteroseptal myocardium and apical region. There now appear to be an apparent medium-sized mural apical thrombus which was not noted on his last echo. He denies paresthesias. He denies episodes of chest pain. He denies shortness of breath.At his last office visit in February in light of his moderate sized mural thrombus, Plavix, was discontinued, and he was started on Coumadin therapy.  In August 2015 he tripped and hit his head on a bar recently developed severe headache, transient leg weakness, which evolves into slurred speech.  He underwent evaluation 1-1/2 weeks later and was found  to have a subdural hematoma.  He underwent a right burr hole subdural hematoma evacuation on 02/05/2014 by [name] at [facility].  Initially aspirin and Coumadin were on hold.  He had a follow-up CT scan and ultimately was given clearance to reinstitute Coumadin anticoagulation in light of his severe cardiomyopathy with an ejection fraction of 15-20% on echo on 03/13/2014 and medium-sized apical mural thrombus which had been documented on a previous echo.  This ultimately has stabilized and he has been back on warfarin anticoagulation.    When I evaluated him in 2016 he denied any chest pain, shortness of  breath or recurrent palpitations.  His ICD was  last interrogated one month ago by Dr. [name].  He was noted to have very short nonsustained bursts of nonsustained VT with one episode lasting 8 beats at 212 bpm on 07/09/2014.  His  device was adjusted so as it would intervene more quickly and avoid syncope.    I saw him in September 2018 and prior to that evaluation had not seen him since 2016.  Over this time period, he has been without recurrent chest pain.  At times he has had some occasional dizziness and weakness.  He saw Dr. Sallyanne Kuster on August 09 2016.  His device was interrogated and showed 72% atrial pacing and no ventricular pacing.  There was one 8 beat episode of nonsustained VT since his last device check.  His last echo Doppler study was in September 2015.  He was evaluated by Dr. Junious Silk for insertion of a penile implantation.  Preoperative clearance was given by Dr. Sallyanne Kuster was advised that he hold his warfarin and he will stop his aspirin permanently.  He was  seen at [facility] and was felt to have stable stage III chronic kidney disease.  His blood pressure was controlled and he was euvolemic.   He underwent an echo Doppler study on April 18, 2017.  This continued to show reduced ejection fraction at 20-25%.  He had significant wall motion abnormality with akinesis of the apical anterior septum, inferolateral wall, and the entire apex.  There was grade 1 diastolic dysfunction.  He was seen by Dr. Sallyanne Kuster in February 2019.  At that time, his ICD had normal device function.  He has been undergoing remote downloads every 3 months via [facility].  He has not experienced any episodes of recent angina.  He was found to have rare episodes of nonsustained VT only 3 in the last 12 months.  At times he notes some mild dizziness if he stands up very fast.  I last saw him in April 2019.  Since that time, he has had evaluations with Dr. Sallyanne Kuster and was not device dependent but had 80% atrial pacing.  His device had recorded lengthy episodes of NSVT but had not delivered therapies.  On February 24, 2019 he underwent dual-chamber ICD generator change out due to the device reaching ERI.  He saw Dr. Sallyanne Kuster in  follow-up in November 2020.  He has had issues with mild exertionally precipitated chest tightness which has been a chronic problem for some time and over several follow-up visits with Dr. Sallyanne Kuster he was started on isosorbide and ultimately titrated to 120 mg daily.  Presently, Steven [name] continues to admit to his chronic class II stable anginal symptomatology precipitated by increasing activity.  He does not chest pain walking up steps in his house.  He is unaware of any recurrent palpitations or defibrillator discharge.  He has continued to be on warfarin anticoagulation.  He tells me he has been found to have an elevated PSA and will be undergoing prostate biopsy on June 21.  He continues to be followed in the Coumadin clinic and warfarin will need to be held for the procedure.  He states his blood pressure has been stable.  He denies any dizziness, presyncope or syncope.  He presents for evaluation.   The patient does not have symptoms concerning for COVID-19 infection (fever, chills, cough, or new shortness of breath).    Past Medical History:  Diagnosis Date  . Acute MI, anterior wall ([place])   .  CAD (coronary artery disease)    2D ECHO, 07/13/2011 - EF <25%, LV moderatelty dilated, LA moderately dilatedLEXISCAN, 12/14/2011 - moderate-severe perfusion defect seen in the basal anteroseptal, mid anterior, apicacl anterior, apical, apical inferior, and apical lateral regions, post-stress EF 25%, new EKG changes from baseline abnormalities  . Inguinal hernia, left    Past Surgical History:  Procedure Laterality Date  . CARDIAC CATHETERIZATION  11/25/2010   Predilation balloon-Apex monorail 2x42mm, Cutting balloon-2.25x31mm, resulting in a reduction of 100% stenosis down to less than 10%  . CARDIAC CATHETERIZATION  07/12/2010   LAD stented with a 3.5x48mm bare-metal Veriflex stent resulting in a reduction of 100% lesion to 0%  . CARDIAC DEFIBRILLATOR PLACEMENT  03/02/2011   Medtronic Protecta XT DR,  model [id number], serial [id number]  . ICD GENERATOR CHANGEOUT N/A 02/24/2019   Procedure: ICD GENERATOR CHANGEOUT;  Surgeon: Sanda Klein, MD;  Location: [facility];  Service: Cardiovascular;  Laterality: N/A;  . PACEMAKER PLACEMENT  07/14/2010   Temporary placement of pacemaker, if rhythm issue continues will need a permanent device     Current Meds  Medication Sig  . albuterol (PROVENTIL HFA;VENTOLIN HFA) 108 (90 Base) MCG/ACT inhaler Inhale 1-2 puffs into the lungs every 6 (six) hours as needed for wheezing or shortness of breath.  [name] albuterol (PROVENTIL) (2.5 MG/3ML) 0.083% nebulizer solution Take 2.5 mg by nebulization every 6 (six) hours as needed for wheezing or shortness of breath.  [name] atorvastatin (LIPITOR) 40 MG tablet Take 1 tablet by mouth once daily (Patient taking differently: Take 40 mg by mouth daily. )  . carvedilol (COREG) 25 MG tablet TAKE 1 TABLET BY MOUTH TWICE DAILY WITH A MEAL  . Cholecalciferol (VITAMIN D) 125 MCG (5000 UT) CAPS Take 5,000 Units by mouth daily.  . dapagliflozin propanediol (FARXIGA) 10 MG TABS tablet Take 10 mg by mouth daily.  . fluticasone (FLONASE) 50 MCG/ACT nasal spray Place 2 sprays into both nostrils daily.  . furosemide (LASIX) 40 MG tablet Take 20 mg by mouth daily.  . isosorbide mononitrate (IMDUR) 120 MG 24 hr tablet Take 1 tablet (120 mg total) by mouth daily.  [name] lisinopril (ZESTRIL) 5 MG tablet Take 1 tablet by mouth once daily  . montelukast (SINGULAIR) 10 MG tablet Take 10 mg by mouth daily.  . Multiple Vitamin (MULTIVITAMIN) tablet Take 1 tablet by mouth daily.  . nitroGLYCERIN (NITROSTAT) 0.4 MG SL tablet DISSOLVE ONE TABLET UNDER THE TONGUE EVERY 5 MINUTES AS NEEDED FOR CHEST PAIN.  DO NOT EXCEED A TOTAL OF 3 DOSES IN 15 MINUTES (Patient taking differently: Place 0.4 mg under the tongue every 5 (five) minutes as needed for chest pain. )  . spironolactone (ALDACTONE) 25 MG tablet Take 1/2 (one-half) tablet by mouth twice daily  (Patient taking differently: Take 12.5 mg by mouth daily. )  . tamsulosin (FLOMAX) 0.4 MG CAPS capsule Take 0.4 mg by mouth at bedtime.  . Tiotropium Bromide-Olodaterol (STIOLTO RESPIMAT) 2.5-2.5 MCG/ACT AERS Inhale 2 puffs into the lungs daily.  [name] warfarin (COUMADIN) 5 MG tablet TAKE 1/2 TO 1 TABLET BY MOUTH  ONCE DAILY AS DIRECTED BY  COUMDAIN CLINIC     Allergies:   Patient has no known allergies.   Social History   Tobacco Use  . Smoking status: Former Smoker    Types: Cigarettes, Cigars  . Smokeless tobacco: Never Used  Substance Use Topics  . Alcohol use: Yes    Alcohol/week: 2.0 - 3.0 standard drinks    Types: 2 -  3 drink(s) per week  . Drug use: No     Family Hx: The patient's family history is not on file.  ROS:   Please see the history of present illness.    He denies fever chills or night sweats No dizziness Chronic exertional angina improved with titration of isosorbide but he still gets several episodes per month No GI bleeding No defibrillator discharge Increased PSA and need for biopsy No leg swelling Sleeping adequately All other systems reviewed and are negative.   Prior CV studies:   The following studies were reviewed today:  I reviewed the interim records of Dr. Sallyanne Kuster, his generator change out, as well as follow-up evaluation.  Labs/Other Tests and Data Reviewed:    EKG: I personally reviewed the ECG of May 26, 2019 which shows an atrially paced rhythm at 87 bpm, prolonged AV conduction, old anterior MI, and lateral T wave abnormalities.   An ECG dated 09/26/2017 was personally reviewed today and demonstrated:  Atrially paced rhythm 72 bpm.  Isolated PVC.  Poor R wave progression consistent with old anterior infarction and anterolateral ST-T changes.  Recent Labs: 02/19/2019: BUN 18; Creatinine, Ser 1.41; Hemoglobin 14.6; Platelets 156; Potassium 4.5; Sodium 134   Recent Lipid Panel Lab Results  Component Value Date/Time   CHOL 125  02/19/2019 09:38 AM   CHOL 161 07/01/2013 08:38 AM   TRIG 112 02/19/2019 09:38 AM   TRIG 108 07/01/2013 08:38 AM   HDL 41 02/19/2019 09:38 AM   HDL 43 07/01/2013 08:38 AM   CHOLHDL 3.0 02/19/2019 09:38 AM   CHOLHDL 3.3 09/03/2014 08:03 AM   LDLCALC 62 02/19/2019 09:38 AM   LDLCALC 96 07/01/2013 08:38 AM    Wt Readings from Last 3 Encounters:  12/04/19 178 lb (80.7 kg)  05/26/19 185 lb 9.6 oz (84.2 kg)  02/24/19 184 lb (83.5 kg)     Objective:    Vital Signs:  Ht 5\' 11"  (1.803 m)   Wt 178 lb (80.7 kg)   BMI 24.83 kg/m    Since this was a virtual I could not physically examine the patient. He states his blood pressure when checked by Dr. Brunetta Genera has been stable. Breathing was normal and not labored There was no audible wheezing Self palpation of his heart rhythm revealed a steady regular rhythm He denied any current chest pain at the time chest wall tenderness No abdominal pain No swelling Sleeping adequately   ASSESSMENT & PLAN:    1. CAD status post large anterior wall myocardial infarction with delayed presentation in January 2012.  He has experienced class II [place] Heart Association anginal symptomatology and currently is now on a medical regimen consisting of isosorbide 120 mg daily, carvedilol 25 mg twice a day in addition to spironolactone and lisinopril.  He continues to experience rare to occasional episodes of mild chest pain with significant activity.  I have suggested the addition of low-dose amlodipine 2.5 mg to take at bedtime to see if this can further improve his symptomatology. 2. Ischemic cardiomyopathy.  His last echo Doppler study in 2018 showed an EF of 20 to 25%.  He initially had also developed a subsequent apical thrombus for which she has been on warfarin therapy.  We will plan follow-up echo Doppler study to reassess LV function.  He may be a candidate for transition to [facility].  He was recently started on Farxiga for borderline diabetes mellitus  which will have benefit with CHF 3. ICD: He underwent generator change out  for his dual-chamber ICD in February 24, 2019.  He continues to be followed by pacemaker checks at 59-month intervals.  He is followed by Dr. Sallyanne Kuster. 4. Warfarin anticoagulation: Previously documented moderate size apical mural thrombus.  Warfarin will need to be held prior to planned prostate biopsy later this month.  He is followed in our Coumadin clinic. 5. Hyperlipidemia: Target LDL less than 70.  He is followed by Dr. Brunetta Genera.  Apparently laboratory was checked last month and he was was told his lipids were excellent. 6. Ventricular tachycardia: He has been documented in the past to have episodes of nonsustained VT particularly prior to his generator change.  Last remote device check did not reveal any clinically significant episodes of high ventricular rate or atrial mode switch.  COVID-19 Education: The signs and symptoms of COVID-19 were discussed with the patient and how to seek care for testing (follow up with PCP or arrange E-visit).  The importance of social distancing was discussed today.  Time:   Today, I have spent 25 minutes with the patient with telehealth technology discussing the above problems.     Medication Adjustments/Labs and Tests Ordered: Current medicines are reviewed at length with the patient today.  Concerns regarding medicines are outlined above.   Tests Ordered: No orders of the defined types were placed in this encounter.   Medication Changes: Meds ordered this encounter  Medications  . isosorbide mononitrate (IMDUR) 120 MG 24 hr tablet    Sig: Take 1 tablet (120 mg total) by mouth daily.    Dispense:  90 tablet    Refill:  3    Follow Up: We will schedule patient for follow-up echo Doppler study, initiate low-dose amlodipine 2.5 mg at bedtime, and arrange for follow-up in person office visit in 3-4 months.  Signed, Shelva Majestic, MD  12/04/2019 9:07 AM    [place]

## 2019-12-04 NOTE — Patient Instructions (Signed)
Medication Instructions:  START AMLODIPINE 2.5MG  DAILY AT BEDTIME *If you need a refill on your cardiac medications before your next appointment, please call your pharmacy*  Lab Work: NONE ORDERED THIS VISIT  Testing/Procedures: Your physician has requested that you have an echocardiogram. Echocardiography is a painless test that uses sound waves to create images of your heart. It provides your doctor with information about the size and shape of your heart and how well your heart's chambers and valves are working. This procedure takes approximately one hour. There are no restrictions for this procedure. [place]  Follow-Up: At [facility], you and your health needs are our priority.  As part of our continuing mission to provide you with exceptional heart care, we have created designated Provider Care Teams.  These Care Teams include your primary Cardiologist (physician) and Advanced Practice Providers (APPs -  Physician Assistants and Nurse Practitioners) who all work together to provide you with the care you need, when you need it.  We recommend signing up for the patient portal called "MyChart".  Sign up information is provided on this After Visit Summary.  MyChart is used to connect with patients for Virtual Visits (Telemedicine).  Patients are able to view lab/test results, encounter notes, upcoming appointments, etc.  Non-urgent messages can be sent to your provider as well.   To learn more about what you can do with MyChart, go to [email or web address].    Your next appointment:   Monday October 18TH AT 08:40AM. (THIS WAS THE NEXT SOONEST AVAILABLE APPOINTMENT, CALL [phone] IF YOU NEED TO RESCHEDULE)  The format for your next appointment:   In Person  Provider:   Shelva Majestic, MD

## 2019-12-08 ENCOUNTER — Other Ambulatory Visit: Payer: Self-pay

## 2019-12-08 ENCOUNTER — Ambulatory Visit (INDEPENDENT_AMBULATORY_CARE_PROVIDER_SITE_OTHER): Payer: Medicaid Other | Admitting: Pharmacist

## 2019-12-08 DIAGNOSIS — Z7901 Long term (current) use of anticoagulants: Secondary | ICD-10-CM

## 2019-12-08 DIAGNOSIS — I513 Intracardiac thrombosis, not elsewhere classified: Secondary | ICD-10-CM

## 2019-12-08 LAB — POCT INR: INR: 1.9 — AB (ref 2.0–3.0)

## 2019-12-22 ENCOUNTER — Other Ambulatory Visit: Payer: Self-pay | Admitting: Cardiovascular Disease

## 2019-12-24 ENCOUNTER — Other Ambulatory Visit: Payer: Self-pay | Admitting: Cardiovascular Disease

## 2019-12-30 ENCOUNTER — Other Ambulatory Visit: Payer: Self-pay

## 2019-12-30 ENCOUNTER — Ambulatory Visit (INDEPENDENT_AMBULATORY_CARE_PROVIDER_SITE_OTHER): Payer: Medicaid Other | Admitting: Pharmacist Clinician (PhC)/ Clinical Pharmacy Specialist

## 2019-12-30 DIAGNOSIS — Z7901 Long term (current) use of anticoagulants: Secondary | ICD-10-CM | POA: Diagnosis not present

## 2019-12-30 DIAGNOSIS — I513 Intracardiac thrombosis, not elsewhere classified: Secondary | ICD-10-CM | POA: Diagnosis not present

## 2019-12-30 LAB — POCT INR: INR: 1.5 — AB (ref 2.0–3.0)

## 2020-01-03 ENCOUNTER — Other Ambulatory Visit: Payer: Self-pay | Admitting: Cardiovascular Disease

## 2020-01-05 ENCOUNTER — Other Ambulatory Visit: Payer: Self-pay

## 2020-01-05 ENCOUNTER — Ambulatory Visit (HOSPITAL_COMMUNITY): Payer: Medicaid Other | Attending: Cardiology

## 2020-01-05 VITALS — BP 115/70

## 2020-01-05 DIAGNOSIS — I255 Ischemic cardiomyopathy: Secondary | ICD-10-CM | POA: Diagnosis present

## 2020-01-05 DIAGNOSIS — I5042 Chronic combined systolic (congestive) and diastolic (congestive) heart failure: Secondary | ICD-10-CM | POA: Insufficient documentation

## 2020-01-05 MED ORDER — PERFLUTREN LIPID MICROSPHERE
1.0000 mL | INTRAVENOUS | Status: AC | PRN
  Administered 2020-01-05: 1 mL via INTRAVENOUS

## 2020-01-07 ENCOUNTER — Other Ambulatory Visit: Payer: Self-pay | Admitting: Cardiovascular Disease

## 2020-01-09 ENCOUNTER — Other Ambulatory Visit: Payer: Self-pay | Admitting: Cardiovascular Disease

## 2020-01-09 NOTE — Telephone Encounter (Signed)
Rx(s) sent to pharmacy electronically.  

## 2020-01-15 ENCOUNTER — Telehealth: Payer: Self-pay

## 2020-01-15 NOTE — Telephone Encounter (Signed)
Defer to Dr. Claiborne Billings

## 2020-01-15 NOTE — Telephone Encounter (Signed)
Called and spoke with pt, he reports that the new medication Dr.Kelly placed him on has really helped (amlodipine) and he would like to know if he has to continue taking his isosorbide since this new medication is helping and he doesn't want to be on so many medications.  Notified that from Dr.kelly's last office note it showed that he should still be taking the isosorbide. Pt verbalized understanding, would still like Dr.Kelly and Dr.C's input on if he can stop the isosorbide. Will send to MDs

## 2020-01-19 NOTE — Telephone Encounter (Signed)
He was still having class II anginal symptoms on his previous medical therapy.  Certainly beneficial that amlodipine has significantly helped.  However I would continue isosorbide and not discontinued.  If he feels his blood pressure is getting low he can try reducing the isosorbide in half but I would not discontinue it altogether

## 2020-01-21 ENCOUNTER — Other Ambulatory Visit: Payer: Self-pay

## 2020-01-21 ENCOUNTER — Ambulatory Visit (INDEPENDENT_AMBULATORY_CARE_PROVIDER_SITE_OTHER): Payer: Medicaid Other

## 2020-01-21 DIAGNOSIS — Z7901 Long term (current) use of anticoagulants: Secondary | ICD-10-CM | POA: Diagnosis not present

## 2020-01-21 DIAGNOSIS — I513 Intracardiac thrombosis, not elsewhere classified: Secondary | ICD-10-CM | POA: Diagnosis not present

## 2020-01-21 LAB — POCT INR: INR: 1.4 — AB (ref 2.0–3.0)

## 2020-01-21 NOTE — Patient Instructions (Signed)
Take 1.5 tablets today and 1 tablet tomorrow and then increase to  1 tablet daily except 1/2 tablet each Sunday, Tuesday and Thursday.   Repeat INR in 2 weeks

## 2020-01-22 NOTE — Telephone Encounter (Signed)
[facility] 7/29

## 2020-01-23 NOTE — Telephone Encounter (Signed)
Advised patient, verbalized understanding  

## 2020-01-23 NOTE — Telephone Encounter (Signed)
Follow Up:     Returning Steven [name]'s call from yesterday.

## 2020-02-04 ENCOUNTER — Other Ambulatory Visit: Payer: Self-pay

## 2020-02-04 ENCOUNTER — Ambulatory Visit (INDEPENDENT_AMBULATORY_CARE_PROVIDER_SITE_OTHER): Payer: Medicaid Other

## 2020-02-04 DIAGNOSIS — Z7901 Long term (current) use of anticoagulants: Secondary | ICD-10-CM | POA: Diagnosis not present

## 2020-02-04 DIAGNOSIS — I513 Intracardiac thrombosis, not elsewhere classified: Secondary | ICD-10-CM | POA: Diagnosis not present

## 2020-02-04 LAB — POCT INR: INR: 1.4 — AB (ref 2.0–3.0)

## 2020-02-04 NOTE — Patient Instructions (Signed)
Take 1.5 tablets today and 1.5  tablets tomorrow and then increase to  1 tablet daily except 1/2 tablet each  Tuesday and Thursday.   Repeat INR in 2 weeks

## 2020-02-18 ENCOUNTER — Other Ambulatory Visit: Payer: Self-pay

## 2020-02-18 ENCOUNTER — Ambulatory Visit (INDEPENDENT_AMBULATORY_CARE_PROVIDER_SITE_OTHER): Payer: Medicaid Other | Admitting: Pharmacist

## 2020-02-18 DIAGNOSIS — I513 Intracardiac thrombosis, not elsewhere classified: Secondary | ICD-10-CM | POA: Diagnosis not present

## 2020-02-18 DIAGNOSIS — Z7901 Long term (current) use of anticoagulants: Secondary | ICD-10-CM

## 2020-02-18 LAB — POCT INR: INR: 1.7 — AB (ref 2.0–3.0)

## 2020-02-24 ENCOUNTER — Ambulatory Visit (INDEPENDENT_AMBULATORY_CARE_PROVIDER_SITE_OTHER): Payer: Medicare Other | Admitting: *Deleted

## 2020-02-24 DIAGNOSIS — I255 Ischemic cardiomyopathy: Secondary | ICD-10-CM

## 2020-02-24 LAB — CUP PACEART REMOTE DEVICE CHECK
Battery Remaining Longevity: 111 mo
Battery Voltage: 3.03 V
Brady Statistic AP VP Percent: 0.03 %
Brady Statistic AP VS Percent: 84.69 %
Brady Statistic AS VP Percent: 0.01 %
Brady Statistic AS VS Percent: 15.27 %
Brady Statistic RA Percent Paced: 84.54 %
Brady Statistic RV Percent Paced: 0.04 %
Date Time Interrogation Session: 20210831012303
HighPow Impedance: 52 Ohm
HighPow Impedance: 74 Ohm
Implantable Lead Implant Date: 20120906
Implantable Lead Implant Date: 20120906
Implantable Lead Location: 753859
Implantable Lead Location: 753860
Implantable Lead Model: 185
Implantable Lead Model: 5076
Implantable Lead Serial Number: 358872
Implantable Pulse Generator Implant Date: 20200831
Lead Channel Impedance Value: 380 Ohm
Lead Channel Impedance Value: 399 Ohm
Lead Channel Impedance Value: 399 Ohm
Lead Channel Pacing Threshold Amplitude: 0.5 V
Lead Channel Pacing Threshold Amplitude: 0.625 V
Lead Channel Pacing Threshold Pulse Width: 0.4 ms
Lead Channel Pacing Threshold Pulse Width: 0.4 ms
Lead Channel Sensing Intrinsic Amplitude: 2.125 mV
Lead Channel Sensing Intrinsic Amplitude: 2.125 mV
Lead Channel Sensing Intrinsic Amplitude: 5.5 mV
Lead Channel Sensing Intrinsic Amplitude: 5.5 mV
Lead Channel Setting Pacing Amplitude: 1.5 V
Lead Channel Setting Pacing Amplitude: 2.5 V
Lead Channel Setting Pacing Pulse Width: 0.4 ms
Lead Channel Setting Sensing Sensitivity: 0.3 mV

## 2020-02-25 NOTE — Progress Notes (Signed)
Remote ICD transmission.   

## 2020-02-29 ENCOUNTER — Other Ambulatory Visit: Payer: Self-pay | Admitting: Cardiovascular Disease

## 2020-03-08 ENCOUNTER — Ambulatory Visit (INDEPENDENT_AMBULATORY_CARE_PROVIDER_SITE_OTHER): Payer: Medicaid Other

## 2020-03-08 DIAGNOSIS — I513 Intracardiac thrombosis, not elsewhere classified: Secondary | ICD-10-CM | POA: Diagnosis not present

## 2020-03-08 DIAGNOSIS — Z5181 Encounter for therapeutic drug level monitoring: Secondary | ICD-10-CM | POA: Diagnosis not present

## 2020-03-08 DIAGNOSIS — Z7901 Long term (current) use of anticoagulants: Secondary | ICD-10-CM

## 2020-03-08 LAB — POCT INR: INR: 2.4 (ref 2.0–3.0)

## 2020-03-08 NOTE — Patient Instructions (Addendum)
Continue taking 1 tablet daily except 1/2 tablet each Wednesdays  Repeat INR in 5 weeks

## 2020-04-07 ENCOUNTER — Other Ambulatory Visit: Payer: Self-pay | Admitting: Cardiovascular Disease

## 2020-04-07 MED ORDER — WARFARIN SODIUM 5 MG PO TABS: ORAL_TABLET | ORAL | 0 refills | Status: DC

## 2020-04-07 NOTE — Telephone Encounter (Signed)
*  STAT* If patient is at the pharmacy, call can be transferred to refill team.   1. Which medications need to be refilled? (please list name of each medication and dose if known) warfarin (COUMADIN) 5 MG tablet [[id number]]   2. Which pharmacy/location (including street and city if local pharmacy) is medication to be sent to? [facility] ([address] [place]  [address] ([place]) [place] [address]  Phone:  [phone] Fax:  [phone]   3. Do they need a 30 day or 90 day supply? [place]

## 2020-04-12 ENCOUNTER — Encounter: Payer: Self-pay | Admitting: Cardiovascular Disease

## 2020-04-12 ENCOUNTER — Other Ambulatory Visit: Payer: Self-pay

## 2020-04-12 ENCOUNTER — Ambulatory Visit (INDEPENDENT_AMBULATORY_CARE_PROVIDER_SITE_OTHER): Payer: Medicaid Other

## 2020-04-12 ENCOUNTER — Ambulatory Visit (INDEPENDENT_AMBULATORY_CARE_PROVIDER_SITE_OTHER): Payer: Medicare Other | Admitting: Cardiovascular Disease

## 2020-04-12 ENCOUNTER — Telehealth: Payer: Self-pay | Admitting: Cardiovascular Disease

## 2020-04-12 VITALS — BP 120/80 | HR 63 | Ht 69.0 in | Wt 180.0 lb

## 2020-04-12 DIAGNOSIS — Z95810 Presence of automatic (implantable) cardiac defibrillator: Secondary | ICD-10-CM

## 2020-04-12 DIAGNOSIS — I25118 Atherosclerotic heart disease of native coronary artery with other forms of angina pectoris: Secondary | ICD-10-CM

## 2020-04-12 DIAGNOSIS — I5042 Chronic combined systolic (congestive) and diastolic (congestive) heart failure: Secondary | ICD-10-CM | POA: Diagnosis not present

## 2020-04-12 DIAGNOSIS — I513 Intracardiac thrombosis, not elsewhere classified: Secondary | ICD-10-CM

## 2020-04-12 DIAGNOSIS — E785 Hyperlipidemia, unspecified: Secondary | ICD-10-CM

## 2020-04-12 DIAGNOSIS — I255 Ischemic cardiomyopathy: Secondary | ICD-10-CM

## 2020-04-12 DIAGNOSIS — Z7901 Long term (current) use of anticoagulants: Secondary | ICD-10-CM

## 2020-04-12 LAB — POCT INR: INR: 3.8 — AB (ref 2.0–3.0)

## 2020-04-12 MED ORDER — ENTRESTO 24-26 MG PO TABS: 1.0000 | ORAL_TABLET | Freq: Two times a day (BID) | ORAL | 4 refills | Status: DC

## 2020-04-12 NOTE — Telephone Encounter (Signed)
Will send this message to RN working with Dr. Claiborne Billings today, and [facility], for they both saw the pt earlier today.

## 2020-04-12 NOTE — Patient Instructions (Signed)
Medication Instructions:   STOP TAKING LISINOPRIL    START  TOMORROW  OCT 19 -- ENTRESTO 24/26 MG  ONE TABLET TWICE A DAY   *If you need a refill on your cardiac medications before your next appointment, please call your pharmacy*   Lab Work:  IN 2 TO 3 WEEKS  NOV 1 THRU 12 CBC CMP LIPID- FASTING PRO BNP TSH If you have labs (blood work) drawn today and your tests are completely normal, you will receive your results only by: [name] MyChart Message (if you have MyChart) OR . A paper copy in the mail If you have any lab test that is abnormal or we need to change your treatment, we will call you to review the results.   Testing/Procedures: NOT NEEDED   Follow-Up: At [facility], you and your health needs are our priority.  As part of our continuing mission to provide you with exceptional heart care, we have created designated Provider Care Teams.  These Care Teams include your primary Cardiologist (physician) and Advanced Practice Providers (APPs -  Physician Assistants and Nurse Practitioners) who all work together to provide you with the care you need, when you need it.     Your next appointment:   2 month(s)  The format for your next appointment:   In Person  Provider:   Shelva Majestic, MD   Other Instructions You have been referred to [facility] -  TITRATE ENTRESTO  3 TO 4 WEEKS

## 2020-04-12 NOTE — Progress Notes (Signed)
Cardiology Office Note    Date:  04/14/2020   ID:  [name], [name] [birth year], MRN [id number]  PCP:  Lorelee Market, MD  Cardiologist:  Shelva Majestic, MD    History of Present Illness:  Steven [name] is a 64 y.o. male with documented severe ischemic cardiomyopathy following an extensive anterior myocardial infarction with subsequent defibrillator for primary prevention.    He presents for follow-up cardiology evaluation.  Steven [name] is originally from [place] and suffered a large out of hospital anterior wall myocardial infarction in January 2012. [name] He presented after a three-hour delay to the hospital and was taken emergently to the cardiac catheterization laboratory where I performed successful emergent intervention with the door to balloon time of only 25 minutes. His LAD was successfully recanalized. CPK increased to 8012 with an MB of 716 and troponin was greater than 100 initially. He subsequently wore a life vest and ultimately after several months without significant improvement of LV function he underwent implantable cardio defibrillator insertion. Interrogation of his defibrillator in the past had shown episodes of nonsustained VT and his dose of carvedilol has been further titrated.  A followup echo Doppler study on 08/04/2013 showed an ejection fraction of 20-25% with diffuse hypokinesis but there was akinesis of the entire anteroseptal myocardium and apical region. There now appear to be an apparent medium-sized mural apical thrombus which was not noted on his last echo. He denies paresthesias. He denies episodes of chest pain. He denies shortness of breath.At his last office visit in February in light of his moderate sized mural thrombus, Plavix, was discontinued, and he was started on Coumadin therapy.  In August 2015 he tripped and hit his head on a bar recently developed severe headache, transient leg weakness, which evolves into slurred speech. He underwent evaluation  1-1/2 weeks later and was found to have a subdural hematoma. He underwent a right burr hole subdural hematoma evacuation on 02/05/2014 by [name] at [facility]. Initially aspirin and Coumadin were on hold. He had a follow-up CT scan and ultimately was given clearance to reinstitute Coumadin anticoagulation in light of his severe cardiomyopathy with an ejection fraction of 15-20% on echo on 03/13/2014 and medium-sized apical mural thrombus which had been documented on a previous echo. This ultimately has stabilized and he has been back on warfarin anticoagulation.   When I evaluated him in 2016 he denied any chest pain, shortness of breath or recurrent palpitations. His ICD was last interrogated one month ago by Dr. [name]. He was noted to have very short nonsustained bursts of nonsustained VT with one episode lasting 8 beats at 212 bpm on 07/09/2014. His device was adjusted so as it would intervene more quickly and avoid syncope.   I saw him in September 2018 and prior to that evaluation had not seen him since 2016. Over this time period, he has been without recurrent chest pain. At times he has had some occasional dizziness and weakness. He saw Dr. Sallyanne Kuster on August 09 2016. His device was interrogated and showed 72% atrial pacing and no ventricular pacing. There was one 8 beat episode of nonsustained VT since his last device check. His last echo Doppler study was in September 2015. He was evaluated by Dr. Anastasia Fiedler insertion of apenile implantation. Preoperative clearance was given by Dr. Sallyanne Kuster was advised that he hold his warfarin and he will stop his aspirin permanently. He was seen at [facility] and was felt to have stable  stage III chronic kidney disease. His blood pressure was controlled and he was euvolemic.   He underwent an echo Doppler study on April 18, 2017. This continued to show reduced ejection fraction at 20-25%. He had  significant wall motion abnormality with akinesis of the apical anterior septum, inferolateral wall, and the entire apex. There was grade 1 diastolic dysfunction. He was seen by Dr. Sallyanne Kuster in February 2019. At that time, his ICD had normal device function. He has been undergoing remote downloads every 3 months via [facility]. He has not experienced any episodes of recent angina. He was found to have rare episodes of nonsustained VT only 3 in the last 12 months. At times he notes some mild dizziness if he stands up very fast.  I saw him in April 2019.  Since that time, he has had evaluations with Dr. Sallyanne Kuster and was not device dependent but had 80% atrial pacing.  His device had recorded lengthy episodes of NSVT but had not delivered therapies.  On February 24, 2019 he underwent dual-chamber ICD generator change out due to the device reaching ERI.  He saw Dr. Sallyanne Kuster in follow-up in November 2020.  He has had issues with mild exertionally precipitated chest tightness which has been a chronic problem for some time and over several follow-up visits with Dr. Sallyanne Kuster he was started on isosorbide and ultimately titrated to 120 mg daily.  I had not seen him since 2019 but evaluated him in a telemedicine visit on December 04, 2019.  At that time he continued to have chronic class II stable anginal symptomatology precipitated by increasing activity.    During that evaluation I recommend the addition of low-dose amlodipine 2.5 mg and he feels this improved his mild chronic anginal symptomatology.   He is unaware of any recurrent palpitations or defibrillator discharge.  He has continued to be on warfarin anticoagulation.    Since his evaluation he did undergo a prostate biopsy for which warfarin was held.  He is followed by Dr. Junious Silk.  He underwent a repeat echo Doppler study on January 05, 2020 which continue to show very low EF at 20 to 25% with global hypokinesis.  Estimated RV pressure was normal at 23 mm.  He  continues to experience some exertional dyspnea and denies any chest pain.  He presents for evaluation.   Past Medical History:  Diagnosis Date  . Acute MI, anterior wall ([place])   . CAD (coronary artery disease)    2D ECHO, 07/13/2011 - EF <25%, LV moderatelty dilated, LA moderately dilatedLEXISCAN, 12/14/2011 - moderate-severe perfusion defect seen in the basal anteroseptal, mid anterior, apicacl anterior, apical, apical inferior, and apical lateral regions, post-stress EF 25%, new EKG changes from baseline abnormalities  . Inguinal hernia, left     Past Surgical History:  Procedure Laterality Date  . CARDIAC CATHETERIZATION  11/25/2010   Predilation balloon-Apex monorail 2x41m, Cutting balloon-2.25x112m resulting in a reduction of 100% stenosis down to less than 10%  . CARDIAC CATHETERIZATION  07/12/2010   LAD stented with a 3.5x2421mare-metal Veriflex stent resulting in a reduction of 100% lesion to 0%  . CARDIAC DEFIBRILLATOR PLACEMENT  03/02/2011   Medtronic Protecta XT DR, model #[id number] #[id number] . ICD GENERATOR CHANGEOUT N/A 02/24/2019   Procedure: ICD GENERATOR CHANGEOUT;  Surgeon: CroSanda KleinD;  Location: [facility] [place] [facility];  Service: Cardiovascular;  Laterality: N/A;  . PACEMAKER PLACEMENT  07/14/2010   Temporary placement of pacemaker, if rhythm issue continues will need a  permanent device    Current Medications: Outpatient Medications Prior to Visit  Medication Sig Dispense Refill  . albuterol (PROVENTIL HFA;VENTOLIN HFA) 108 (90 Base) MCG/ACT inhaler Inhale 1-2 puffs into the lungs every 6 (six) hours as needed for wheezing or shortness of breath.    [name] albuterol (PROVENTIL) (2.5 MG/3ML) 0.083% nebulizer solution Take 2.5 mg by nebulization every 6 (six) hours as needed for wheezing or shortness of breath.    [name] atorvastatin (LIPITOR) 40 MG tablet Take 1 tablet by mouth once daily (Patient taking differently: Take 40 mg by mouth daily. ) 90 tablet 1  . carvedilol  (COREG) 25 MG tablet TAKE 1 TABLET BY MOUTH TWICE DAILY WITH A MEAL 180 tablet 2  . Cholecalciferol (VITAMIN D) 125 MCG (5000 UT) CAPS Take 5,000 Units by mouth daily.    . dapagliflozin propanediol (FARXIGA) 10 MG TABS tablet Take 10 mg by mouth daily.    . fluticasone (FLONASE) 50 MCG/ACT nasal spray Place 2 sprays into both nostrils daily.    . furosemide (LASIX) 40 MG tablet Take 1 tablet (40 mg total) by mouth daily. 30 tablet 4  . montelukast (SINGULAIR) 10 MG tablet Take 10 mg by mouth daily.    . Multiple Vitamin (MULTIVITAMIN) tablet Take 1 tablet by mouth daily.    . nitroGLYCERIN (NITROSTAT) 0.4 MG SL tablet DISSOLVE ONE TABLET UNDER THE TONGUE EVERY 5 MINUTES AS NEEDED FOR CHEST PAIN.  DO NOT EXCEED A TOTAL OF 3 DOSES IN 15 MINUTES 75 tablet 3  . spironolactone (ALDACTONE) 25 MG tablet Take 1/2 (one-half) tablet by mouth twice daily 90 tablet 3  . tamsulosin (FLOMAX) 0.4 MG CAPS capsule Take 0.4 mg by mouth at bedtime.    . Tiotropium Bromide-Olodaterol (STIOLTO RESPIMAT) 2.5-2.5 MCG/ACT AERS Inhale 2 puffs into the lungs daily.    [name] warfarin (COUMADIN) 5 MG tablet Take 1 to 2 tablets daily as directed by the coumadin clinic 90 tablet 0  . lisinopril (ZESTRIL) 5 MG tablet Take 1 tablet by mouth once daily 90 tablet 2  . amLODipine (NORVASC) 2.5 MG tablet Take 1 tablet (2.5 mg total) by mouth daily. 30 tablet 11  . isosorbide mononitrate (IMDUR) 120 MG 24 hr tablet Take 1 tablet (120 mg total) by mouth daily. 90 tablet 3   No facility-administered medications prior to visit.     Allergies:   Patient has no known allergies.   Social History   Socioeconomic History  . Marital status: Married    Spouse name: Not on file  . Number of children: Not on file  . Years of education: Not on file  . Highest education level: Not on file  Occupational History  . Not on file  Tobacco Use  . Smoking status: Former Smoker    Types: Cigarettes, Cigars  . Smokeless tobacco: Never Used   Substance and Sexual Activity  . Alcohol use: Yes    Alcohol/week: 2.0 - 3.0 standard drinks    Types: 2 - 3 drink(s) per week  . Drug use: No  . Sexual activity: Not on file  Other Topics Concern  . Not on file  Social History Narrative  . Not on file   Social Determinants of Health   Financial Resource Strain:   . Difficulty of Paying Living Expenses: Not on file  Food Insecurity:   . Worried About Charity fundraiser in the Last Year: Not on file  . Ran Out of Food in the Last Year: Not  on file  Transportation Needs:   . Lack of Transportation (Medical): Not on file  . Lack of Transportation (Non-Medical): Not on file  Physical Activity:   . Days of Exercise per Week: Not on file  . Minutes of Exercise per Session: Not on file  Stress:   . Feeling of Stress : Not on file  Social Connections:   . Frequency of Communication with Friends and Family: Not on file  . Frequency of Social Gatherings with Friends and Family: Not on file  . Attends Religious Services: Not on file  . Active Member of Clubs or Organizations: Not on file  . Attends Archivist Meetings: Not on file  . Marital Status: Not on file    Socially he is single, he was born in [place].  He has 1 child.  There is remote tobacco history.    Family History:  The patient's family history is not on file.   ROS General: Negative; No fevers, chills, or night sweats;  HEENT: Negative; No changes in vision or hearing, sinus congestion, difficulty swallowing Pulmonary: Negative; No cough, wheezing, shortness of breath, hemoptysis Cardiovascular: See HPI GI: Negative; No nausea, vomiting, diarrhea, or abdominal pain GU: Positive for prostate biopsy, erectile dysfunction; No dysuria, hematuria, or difficulty voiding Musculoskeletal: Negative; no myalgias, joint pain, or weakness Hematologic/Oncology: Negative; no easy bruising, bleeding Endocrine: Negative; no heat/cold intolerance; no diabetes Neuro:  Negative; no changes in balance, headaches Skin: Negative; No rashes or skin lesions Psychiatric: Negative; No behavioral problems, depression Sleep: Negative; No snoring, daytime sleepiness, hypersomnolence, bruxism, restless legs, hypnogognic hallucinations, no cataplexy Other comprehensive 14 point system review is negative.   PHYSICAL EXAM:   VS:  BP 120/80   Pulse 63   Ht _0  (1.753 m)   Wt 180 lb (81.6 kg)   SpO2 99%   BMI 26.58 kg/m     Repeat blood pressure by me was 124/80  Wt Readings from Last 3 Encounters:  04/12/20 180 lb (81.6 kg)  12/04/19 178 lb (80.7 kg)  05/26/19 185 lb 9.6 oz (84.2 kg)    General: Alert, oriented, no distress.  Skin: normal turgor, no rashes, warm and dry HEENT: Normocephalic, atraumatic. Pupils equal round and reactive to light; sclera anicteric; extraocular muscles intact; Nose without nasal septal hypertrophy Mouth/Parynx benign; Mallinpatti scale 3 Neck: No JVD, no carotid bruits; normal carotid upstroke Lungs: clear to ausculatation and percussion; no wheezing or rales Chest wall: without tenderness to palpitation Heart: PMI not displaced, RRR, s1 s2 normal, 1/6 systolic murmur, no diastolic murmur, no rubs, gallops, thrills, or heaves Abdomen: soft, nontender; no hepatosplenomehaly, BS+; abdominal aorta nontender and not dilated by palpation. Back: no CVA tenderness Pulses 2+ Musculoskeletal: full range of motion, normal strength, no joint deformities Extremities: no clubbing cyanosis or edema, Homan's sign negative  Neurologic: grossly nonfocal; Cranial nerves grossly wnl Psychologic: Normal mood and affect   Studies/Labs Reviewed:   EKG:  EKG is ordered today.  ECG (independently read by me): Atrial paced at 63; old anterolateral infarct,  LAHB, QTc 417 msec  April 3/2021ECG (independently read by me): Atrially paced rhythm at 72 bpm.  One PVC.  Poor progression consistent with old anterior infarction.  Anterolateral ST-T  changes  September 2016 ECG (independently read by me): Atrially paced rhythm at 64 bpm.  No ectopy.  Poor anterior R-wave progression concordant with old anterior MI.  March 2016 ECG (independently read by me): Atrially paced rhythm at 74 bpm.  Increased  PR interval at 212 ms.  ST-T changes anterolaterally.  November 2015 ECG (independently read by me): Atrially paced rhythm.  Old anterior wall MI.  Per Ms. Lee noted ST-T changes laterally  Prior September 2015 ECG (independently read by me): Sinus rhythm at 63 beats per minute.  Old anterior wall myocardial infarction, poor R. progression V1 through V4 and T-wave changes V3 through V6, leads 1, and L.    08/18/2013 ECG (independently read by me):  Atrially paced at 64 beats per minute. QTc interval 427 ms. QRS duration 98 ms. Diffuse anterolateral T-wave changes secondary to his prior MI   Prior ECG of 02/27/2013: Atrial  paced rhythm at 64 beats per minute. Evidence for his prior anterolateral wall myocardial infarction with diffuse T-wave abnormality and precordial Q waves V1 through V5   Recent Labs: BMP Latest Ref Rng & Units 02/19/2019 08/29/2017 09/03/2014  Glucose 65 - 99 mg/dL 94 99 84  BUN 8 - 27 mg/dL _0 Creatinine 0.76 - 1.27 mg/dL 1.41(H) 1.73(H) 1.44(H)  BUN/Creat Ratio 10 - _1 -  Sodium 134 - 144 mmol/L 134 133(L) 140  Potassium 3.5 - 5.2 mmol/L 4.5 4.6 4.4  Chloride 96 - 106 mmol/L 100 98 106  CO2 20 - 29 mmol/L 19(L) 20 25  Calcium 8.6 - 10.2 mg/dL 9.2 9.4 8.9     Hepatic Function Latest Ref Rng & Units 08/29/2017 09/03/2014 07/01/2013  Total Protein 6.0 - 8.5 g/dL 7.7 6.8 7.6  Albumin 3.6 - 4.8 g/dL 4.3 4.1 4.2  AST 0 - 40 IU/L _2 ALT 0 - 44 IU/L _3 Alk Phosphatase 39 - 117 IU/L 55 50 55  Total Bilirubin 0.0 - 1.2 mg/dL 0.5 0.5 0.7  Bilirubin, Direct 0.0 - 0.3 mg/dL - - -    CBC Latest Ref Rng & Units 02/19/2019 09/03/2014 07/01/2013  WBC 3.4 - 10.8 x10E3/uL 6.1 6.2 7.7  Hemoglobin  13.0 - 17.7 g/dL 14.6 15.9 15.6  Hematocrit 37.5 - 51.0 % 42.4 46.7 44.5  Platelets 150 - 450 x10E3/uL 156 165 191   Lab Results  Component Value Date   MCV 92 02/19/2019   MCV 93.4 09/03/2014   MCV 90.4 07/01/2013   Lab Results  Component Value Date   TSH 1.500 09/03/2014   Lab Results  Component Value Date   HGBA1C (H) 11/23/2010    5.8 (NOTE)                                                                       According to the ADA Clinical Practice Recommendations for 2011, when HbA1c is used as a screening test:   >=6.5%   Diagnostic of Diabetes Mellitus           (if abnormal result  is confirmed)  5.7-6.4%   Increased risk of developing Diabetes Mellitus  References:Diagnosis and Classification of Diabetes Mellitus,Diabetes UEKC,0034,91(PHXTA 1):S62-S69 and Standards of Medical Care in         Diabetes - 2011,Diabetes Care,2011,34  (Suppl 1):S11-S61.     BNP    Component Value Date/Time   BNP 124.2 (H) 09/03/2014 0803    ProBNP    Component Value Date/Time   PROBNP  9719.0 (H) 11/23/2010 2006     Lipid Panel     Component Value Date/Time   CHOL 125 02/19/2019 0938   CHOL 161 07/01/2013 0838   TRIG 112 02/19/2019 0938   TRIG 108 07/01/2013 0838   HDL 41 02/19/2019 0938   HDL 43 07/01/2013 0838   CHOLHDL 3.0 02/19/2019 0938   CHOLHDL 3.3 09/03/2014 0803   VLDL 16 09/03/2014 0803   LDLCALC 62 02/19/2019 0938   LDLCALC 96 07/01/2013 0838   LABVLDL 22 02/19/2019 0938     RADIOLOGY: No results found.   Additional studies/ records that were reviewed today include:   ECHO 01/05/2020 IMPRESSIONS  1. Since the last study on 04/18/2017 remains very low at 20-25% with  apical aneurysm and no thrombus on Definity echocontrast images.  2. Left ventricular ejection fraction, by estimation, is 20 to 25%. The  left ventricle has severely decreased function. The left ventricle  demonstrates global hypokinesis. The left ventricular internal cavity size  was  moderately dilated. Left  ventricular diastolic parameters are consistent with Grade I diastolic  dysfunction (impaired relaxation).  3. Right ventricular systolic function is normal. The right ventricular  size is normal. There is normal pulmonary artery systolic pressure. The  estimated right ventricular systolic pressure is 19.4 mmHg.  4. The mitral valve is normal in structure. Mild mitral valve  regurgitation. No evidence of mitral stenosis.  5. The aortic valve is normal in structure. Aortic valve regurgitation is  not visualized. No aortic stenosis is present.  6. The inferior vena cava is normal in size with greater than 50%  respiratory variability, suggesting right atrial pressure of 3 mmHg.    ASSESSMENT:    1. Cardiomyopathy, ischemic   2. Chronic combined systolic (congestive) and diastolic (congestive) heart failure ([place])   3. Hyperlipidemia LDL goal <70   4. Coronary artery disease of native artery of native heart with stable angina pectoris ([place])   5. Hyperlipidemia with target LDL less than 70   6. Implantable cardioverter-defibrillator (ICD) in situ   7. 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  CAD: He is status post a large anterior wall myocardial infarction with delayed presentation in January 2012 and has experienced class II New York Heart Association anginal symptomatology.  When I last saw him, I added low-dose amlodipine 2.5 mg to take at bedtime and at the time he was on isosorbide 120 mg daily in addition to carvedilol 25 mg twice a day.  He feels that this has improved his chest pain symptomatology and he has been without recurrent symptoms presently.  2.  Ischemic cardiomyopathy: He had been previously demonstrated to have an EF of 20 to 25%.  Initially had also developed apical thrombus for which he was on warfarin therapy.  I reviewed his most recent echo Doppler study from January 05 2020-week which again shows EF of 20 to 25%.  He has been on  lisinopril 5 mg daily in addition to spironolactone as well as carvedilol and Farxiga for CHF in addition to his isosorbide and amlodipine.  With his continued reduction of LV function I have recommended discontinuance of lisinopril and will transition him to low-dose Entresto initially at 24/26 mg twice a day.  He did not take his lisinopril today with his last dose being yesterday and he will initiate therapy tomorrow.  In 3 weeks I will obtain a follow-up comprehensive metabolic panel proBNP in addition to a CBC and lipid studies  and I will schedule him to see our pharmacist in 3 to 4 weeks.  If his blood pressure will tolerate, Entresto will be further titrated.  He has tolerated Wilder Glade both for his borderline diabetes as well as CHF.  3: ICD: He underwent a generator change out for his dual-chamber ICD in August 2020.  He is followed by Dr. Sallyanne Kuster with pacemaker checks at 28-monthintervals  4.  Warfarin anticoagulation.  Previously he was found to have a moderate size apical mural thrombus.  5: Hyperlipidemia.  He is on atorvastatin 40 mg.  LDL cholesterol in August 2020 was 62.  6.  History of nonsustained VT documented prior to his generator change.  He is undergoing device checks at 33-monthntervals.  I will see him in 2 months for follow-up evaluation, but he will see a pharmacist in 4 [facility]  Medication Adjustments/Labs and Tests Ordered: Current medicines are reviewed at length with the patient today.  Concerns regarding medicines are outlined above.  Medication changes, Labs and Tests ordered today are listed in the Patient Instructions below. Patient Instructions  Medication Instructions:   STOP TAKING LISINOPRIL    START  TOMORROW  OCT 19 -- ENTRESTO 24/26 MG  ONE TABLET TWICE A DAY   *If you need a refill on your cardiac medications before your next appointment, please call your pharmacy*   Lab Work:  IN 2 TO 3 WEEKS  NOV 1 THRU  12 CBC CMP LIPID- FASTING PRO BNP TSH If you have labs (blood work) drawn today and your tests are completely normal, you will receive your results only by: . Marland KitchenyChart Message (if you have MyChart) OR . A paper copy in the mail If you have any lab test that is abnormal or we need to change your treatment, we will call you to review the results.   Testing/Procedures: NOT NEEDED   Follow-Up: [facility].  As part of our continuing mission to provide you with exceptional heart care, we have created designated Provider Care Teams.  These Care Teams include your primary Cardiologist (physician) and Advanced Practice Providers (APPs -  Physician Assistants and Nurse Practitioners) who all work together to provide you with the care you need, when you need it.     Your next appointment:   2 month(s)  The format for your next appointment:   In Person  Provider:   ThShelva MajesticMD   Other Instructions You have been referred to [facility] TO 4 WEEKS     Signed, ThShelva MajesticMD  04/14/2020 2:13 PM    [facility] [address]  [id number]: [phone]

## 2020-04-12 NOTE — Telephone Encounter (Signed)
Wasn't CVRR

## 2020-04-12 NOTE — Telephone Encounter (Signed)
    Pt said he's returning someone's call, no note on file. He said it might have been one of the pharmacists

## 2020-04-12 NOTE — Patient Instructions (Signed)
Hold today and then Continue taking 1 tablet daily except 1/2 tablet each Wednesdays  Repeat INR in 6 weeks

## 2020-04-14 ENCOUNTER — Encounter: Payer: Self-pay | Admitting: Cardiovascular Disease

## 2020-04-14 NOTE — Telephone Encounter (Signed)
Patient was following up on call he made Monday to see he was calling him. Let him know that it was not the pharmacist.

## 2020-04-24 LAB — CBC
Hematocrit: 45 % (ref 37.5–51.0)
Hemoglobin: 15 g/dL (ref 13.0–17.7)
MCH: 31.6 pg (ref 26.6–33.0)
MCHC: 33.3 g/dL (ref 31.5–35.7)
MCV: 95 fL (ref 79–97)
Platelets: 149 10*3/uL — ABNORMAL LOW (ref 150–450)
RBC: 4.75 x10E6/uL (ref 4.14–5.80)
RDW: 12.4 % (ref 11.6–15.4)
WBC: 5.5 10*3/uL (ref 3.4–10.8)

## 2020-04-24 LAB — COMPREHENSIVE METABOLIC PANEL
ALT: 13 IU/L (ref 0–44)
AST: 25 IU/L (ref 0–40)
Albumin/Globulin Ratio: 1.4 (ref 1.2–2.2)
Albumin: 4.3 g/dL (ref 3.8–4.8)
Alkaline Phosphatase: 65 IU/L (ref 44–121)
BUN/Creatinine Ratio: 15 (ref 10–24)
BUN: 23 mg/dL (ref 8–27)
Bilirubin Total: 0.7 mg/dL (ref 0.0–1.2)
CO2: 22 mmol/L (ref 20–29)
Calcium: 9.5 mg/dL (ref 8.6–10.2)
Chloride: 99 mmol/L (ref 96–106)
Creatinine, Ser: 1.49 mg/dL — ABNORMAL HIGH (ref 0.76–1.27)
GFR calc Af Amer: 56 mL/min/{1.73_m2} — ABNORMAL LOW (ref >59)
GFR calc non Af Amer: 49 mL/min/{1.73_m2} — ABNORMAL LOW (ref >59)
Globulin, Total: 3 g/dL (ref 1.5–4.5)
Glucose: 79 mg/dL (ref 65–99)
Potassium: 4.5 mmol/L (ref 3.5–5.2)
Sodium: 134 mmol/L (ref 134–144)
Total Protein: 7.3 g/dL (ref 6.0–8.5)

## 2020-04-24 LAB — LIPID PANEL
Chol/HDL Ratio: 3 ratio (ref 0.0–5.0)
Cholesterol, Total: 142 mg/dL (ref 100–199)
HDL: 48 mg/dL (ref >39)
LDL Chol Calc (NIH): 78 mg/dL (ref 0–99)
Triglycerides: 86 mg/dL (ref 0–149)
VLDL Cholesterol Cal: 16 mg/dL (ref 5–40)

## 2020-04-24 LAB — TSH: TSH: 2.28 u[IU]/mL (ref 0.450–4.500)

## 2020-04-24 LAB — PRO B NATRIURETIC PEPTIDE: NT-Pro BNP: 211 pg/mL (ref 0–376)

## 2020-04-27 ENCOUNTER — Ambulatory Visit (INDEPENDENT_AMBULATORY_CARE_PROVIDER_SITE_OTHER): Payer: Medicare Other | Admitting: Pharmacist

## 2020-04-27 ENCOUNTER — Other Ambulatory Visit: Payer: Self-pay

## 2020-04-27 VITALS — BP 108/84 | HR 68 | Resp 15 | Ht 69.5 in | Wt 178.8 lb

## 2020-04-27 DIAGNOSIS — I255 Ischemic cardiomyopathy: Secondary | ICD-10-CM | POA: Diagnosis not present

## 2020-04-27 DIAGNOSIS — I513 Intracardiac thrombosis, not elsewhere classified: Secondary | ICD-10-CM

## 2020-04-27 DIAGNOSIS — Z7901 Long term (current) use of anticoagulants: Secondary | ICD-10-CM | POA: Diagnosis not present

## 2020-04-27 DIAGNOSIS — I5042 Chronic combined systolic (congestive) and diastolic (congestive) heart failure: Secondary | ICD-10-CM | POA: Diagnosis not present

## 2020-04-27 LAB — POCT INR: INR: 2.6 (ref 2.0–3.0)

## 2020-04-27 MED ORDER — ENTRESTO 24-26 MG PO TABS: 1.0000 | ORAL_TABLET | Freq: Two times a day (BID) | ORAL | 3 refills | Status: DC

## 2020-04-27 NOTE — Progress Notes (Signed)
Patient ID: Steven [name]                 DOB: 02-Sep-1955                      MRN: [id number]     HPI: Steven [name] is a 64 y.o. male referred by Dr. Claiborne Billings to pharmacist clinic for [facility] titration. PMH includes apical thrombus, cardiomyopathy, hyperlipidemia, ICD placement, and VTach.  ECHO performed on 01/05/20 showed unchanged EF at 20-25%. Entresto 24-26mg  twice daily was initiated by Dr Claiborne Billings on 04/12/2020. He presents for assessment and further titration. Denies dizziness, swelling, fatigue, or low blood pressure.   Current HTN meds:  carvedilol 25mg  BID, isosorbide mononitrate 120mg , spironolactone 12.5 mg, amlodipine 2.5mg  daily, furosemide 40mg  daily, and dapagliflozin 10mg  daily.  BP goal: <130/80  Social History: former smoker, 2-3 alcoholic drinks per week   Wt Readings from Last 3 Encounters:  04/27/20 178 lb 12.8 oz (81.1 kg)  04/12/20 180 lb (81.6 kg)  12/04/19 178 lb (80.7 kg)   BP Readings from Last 3 Encounters:  04/27/20 108/84  04/12/20 120/80  01/05/20 115/70   Pulse Readings from Last 3 Encounters:  04/27/20 68  04/12/20 63  05/26/19 79    Renal function: Estimated Creatinine Clearance: 50.3 mL/min (A) (by C-G formula based on SCr of 1.49 mg/dL (H)).  Past Medical History:  Diagnosis Date   Acute MI, anterior wall (HCC)    CAD (coronary artery disease)    2D ECHO, 07/13/2011 - EF <25%, LV moderatelty dilated, LA moderately dilatedLEXISCAN, 12/14/2011 - moderate-severe perfusion defect seen in the basal anteroseptal, mid anterior, apicacl anterior, apical, apical inferior, and apical lateral regions, post-stress EF 25%, new EKG changes from baseline abnormalities   Inguinal hernia, left     Current Outpatient Medications on File Prior to Visit  Medication Sig Dispense Refill   albuterol (PROVENTIL HFA;VENTOLIN HFA) 108 (90 Base) MCG/ACT inhaler Inhale 1-2 puffs into the lungs every 6 (six) hours as needed for wheezing or shortness of breath.      albuterol (PROVENTIL) (2.5 MG/3ML) 0.083% nebulizer solution Take 2.5 mg by nebulization every 6 (six) hours as needed for wheezing or shortness of breath.     amLODipine (NORVASC) 2.5 MG tablet Take 1 tablet (2.5 mg total) by mouth daily. 30 tablet 11   atorvastatin (LIPITOR) 40 MG tablet Take 1 tablet by mouth once daily (Patient taking differently: Take 40 mg by mouth daily. ) 90 tablet 1   carvedilol (COREG) 25 MG tablet TAKE 1 TABLET BY MOUTH TWICE DAILY WITH A MEAL 180 tablet 2   Cholecalciferol (VITAMIN D) 125 MCG (5000 UT) CAPS Take 5,000 Units by mouth daily.     dapagliflozin propanediol (FARXIGA) 10 MG TABS tablet Take 10 mg by mouth daily.     fluticasone (FLONASE) 50 MCG/ACT nasal spray Place 2 sprays into both nostrils daily.     furosemide (LASIX) 40 MG tablet Take 1 tablet (40 mg total) by mouth daily. 30 tablet 4   montelukast (SINGULAIR) 10 MG tablet Take 10 mg by mouth daily.     Multiple Vitamin (MULTIVITAMIN) tablet Take 1 tablet by mouth daily.     nitroGLYCERIN (NITROSTAT) 0.4 MG SL tablet DISSOLVE ONE TABLET UNDER THE TONGUE EVERY 5 MINUTES AS NEEDED FOR CHEST PAIN.  DO NOT EXCEED A TOTAL OF 3 DOSES IN 15 MINUTES 75 tablet 3   spironolactone (ALDACTONE) 25 MG tablet Take 1/2 (one-half) tablet by  mouth twice daily 90 tablet 3   tamsulosin (FLOMAX) 0.4 MG CAPS capsule Take 0.4 mg by mouth at bedtime.     Tiotropium Bromide-Olodaterol (STIOLTO RESPIMAT) 2.5-2.5 MCG/ACT AERS Inhale 2 puffs into the lungs daily.     warfarin (COUMADIN) 5 MG tablet Take 1 to 2 tablets daily as directed by the coumadin clinic 90 tablet 0   isosorbide mononitrate (IMDUR) 120 MG 24 hr tablet Take 1 tablet (120 mg total) by mouth daily. 90 tablet 3   No current facility-administered medications on file prior to visit.    No Known Allergies  Blood pressure 108/84, pulse 68, resp. rate 15, height 5' 9.5" (1.765 m), weight 178 lb 12.8 oz (81.1 kg), SpO2 99 %.  Chronic combined  systolic and diastolic heart failure ([place]) Patient tolerating Entresto 24/26mg  twice daily. Denies problems with therapy or side effects. Noted patient still on furosemide 40mg  daily, but renal function remains stable, as well as his fluid status. Will continue with current Entresto dose, provide 30 day free card, and start process for prior authorization. Patient will follow up with Dr C in 3 week. Will re-assess possible furosemide dose decrease during next appointment.   Penny Frisbie Rodriguez-Guzman PharmD, BCPS, [place] [address] [place] [address] 05/02/2020 6:16 PM

## 2020-04-27 NOTE — Patient Instructions (Addendum)
Return for a follow up appointment with Dr C on Nov/22   Check your blood pressure at home daily (if able) and keep record of the readings.  Take your BP meds as follows:  * NO CHANGE*  Bring all of your meds, your BP cuff and your record of home blood pressures to your next appointment.  Exercise as you're able, try to walk approximately 30 minutes per day.  Keep salt intake to a minimum, especially watch canned and prepared boxed foods.  Eat more fresh fruits and vegetables and fewer canned items.  Avoid eating in fast food restaurants.    HOW TO TAKE YOUR BLOOD PRESSURE: . Rest 5 minutes before taking your blood pressure. .  Don't smoke or drink caffeinated beverages for at least 30 minutes before. . Take your blood pressure before (not after) you eat. . Sit comfortably with your back supported and both feet on the floor (don't cross your legs). . Elevate your arm to heart level on a table or a desk. . Use the proper sized cuff. It should fit smoothly and snugly around your bare upper arm. There should be enough room to slip a fingertip under the cuff. The bottom edge of the cuff should be 1 inch above the crease of the elbow. . Ideally, take 3 measurements at one sitting and record the average.

## 2020-04-29 ENCOUNTER — Telehealth: Payer: Self-pay

## 2020-04-29 NOTE — Telephone Encounter (Signed)
**Note De-Identified Alann Avey Obfuscation** I started a Entresto PA through covermymeds: Key: [id number]

## 2020-04-29 NOTE — Telephone Encounter (Signed)
Prior authorization for Entresto 24-26 mg received from [employer] via [email or web address].  CMM Key: [id number]  Will forward to PA department for processing.

## 2020-04-30 NOTE — Telephone Encounter (Signed)
**Note De-Identified Siomara Burkel Obfuscation** Call received from [place] at [facility]. Per Barnetta Chapel the card that was preventing me from doing this PA was the co-pay card we gave the pt for a free 30 day supply as it was showing in their system as "primary ins card".  Barnetta Chapel and I did this urgent Entresto PA over the phone and she states that there is a 24 hour turn around for their decision and that they will fax [place] a determination letter at that time.  I called the pt to advise and he states that he has been out of Entresto for a week now.  He is aware that I am forwarding this message to our NL triage (pt sees Dr Claiborne Billings) to provide samples until this is taken care of.

## 2020-04-30 NOTE — Telephone Encounter (Signed)
**Note De-Identified Tyrion Glaude Obfuscation** Message received from [place] through covermymeds as follows:  [employer] - PA Case ID: [id number] - Rx #: [id number] Outcome N/A on November 4 We received a prior authorization request for the member listed above. Please note, this member has primary insurance. The member must use their primary insurance plan first. [employer] is the [employer] secondary insurance if the member also has an alternate [employer]. Please pursue coverage of this product through the member's primary insurance OR resubmit your request with a formal denial letter from the members primary insurance. If the member no longer has alternate primary insurance and we are the members only insurance, please have the member contact customer services at the number on the back of their card to have their eligibility information updated (alternate primary insurance information removed from their profile). Drug Entresto 24-26MG  tablets Form [employer] Electronic Prior Authorization Form (2017 NCPDP) Original Claim Info [id number]  I called [employer] and was advised by Herbie Baltimore that [facility] is the pts primary Part D ins plan they have of file and that they do not have any other card on file for him.  I called [employer] and s/w Orvil Feil who advised me that there is another primary card that is showing on the pts account with them and that is why I received this message. She states that they cannot see who the primary Part D ins coverage is with.  Orvil Feil recommends that I call the pt and ask him to contact them at [phone] to have the "primary"ins info they have removed from his file with them if he no longer has it.  I called the pt but got no answer so I left a detailed message (Ok per [facility]) on his VM explaining the above and requested that he contact [employer] at [phone] to discuss and to call Jeani Hawking back at [phone] at [facility] at Dr [facility] office once this has been taken care of or  if he has any questions or concerns about this.

## 2020-04-30 NOTE — Telephone Encounter (Signed)
I talked to Will PharmD - appropriate BIN, PCN and ID given to process 30 day free card given to patient on Nov/2.  Per Delene Loll patient services - No 30 day processed yet for Mr Olivera  He should be able to received 30 day free TODAY. Once the prescription is property processed.   *Update given to patient* He will pick up medication tomorrow morning. Unable to stop by the pharmacy right now or pick-up date - he works evening shift and is working right now.

## 2020-04-30 NOTE — Telephone Encounter (Signed)
Spoke with Loma Sousa at the pharmacy who confirms 30 day free trial has been processed and is ready for pickup.

## 2020-05-02 ENCOUNTER — Encounter: Payer: Self-pay | Admitting: Pharmacist

## 2020-05-02 NOTE — Assessment & Plan Note (Signed)
Patient tolerating Entresto 24/26mg  twice daily. Denies problems with therapy or side effects. Noted patient still on furosemide 40mg  daily, but renal function remains stable, as well as his fluid status. Will continue with current Entresto dose, provide 30 day free card, and start process for prior authorization. Patient will follow up with Dr C in 3 week. Will re-assess possible furosemide dose decrease during next appointment.

## 2020-05-03 NOTE — Telephone Encounter (Signed)
Received a fax from [employer] [facility] [place] stating prior authorization for [employer] 24-26 mg has been approved through 04/30/2021.

## 2020-05-17 ENCOUNTER — Encounter: Payer: Self-pay | Admitting: Cardiovascular Disease

## 2020-05-17 ENCOUNTER — Ambulatory Visit (INDEPENDENT_AMBULATORY_CARE_PROVIDER_SITE_OTHER): Payer: Medicare Other | Admitting: Cardiovascular Disease

## 2020-05-17 ENCOUNTER — Other Ambulatory Visit: Payer: Self-pay

## 2020-05-17 VITALS — BP 122/68 | HR 76 | Ht 70.0 in | Wt 177.8 lb

## 2020-05-17 DIAGNOSIS — I255 Ischemic cardiomyopathy: Secondary | ICD-10-CM

## 2020-05-17 DIAGNOSIS — I25118 Atherosclerotic heart disease of native coronary artery with other forms of angina pectoris: Secondary | ICD-10-CM | POA: Diagnosis not present

## 2020-05-17 DIAGNOSIS — I236 Thrombosis of atrium, auricular appendage, and ventricle as current complications following acute myocardial infarction: Secondary | ICD-10-CM | POA: Diagnosis not present

## 2020-05-17 DIAGNOSIS — Z95810 Presence of automatic (implantable) cardiac defibrillator: Secondary | ICD-10-CM | POA: Diagnosis not present

## 2020-05-17 DIAGNOSIS — I472 Ventricular tachycardia, unspecified: Secondary | ICD-10-CM

## 2020-05-17 DIAGNOSIS — I5042 Chronic combined systolic (congestive) and diastolic (congestive) heart failure: Secondary | ICD-10-CM

## 2020-05-17 DIAGNOSIS — E785 Hyperlipidemia, unspecified: Secondary | ICD-10-CM

## 2020-05-17 DIAGNOSIS — Z7901 Long term (current) use of anticoagulants: Secondary | ICD-10-CM

## 2020-05-17 NOTE — Patient Instructions (Signed)

## 2020-05-18 ENCOUNTER — Encounter: Payer: Self-pay | Admitting: Cardiovascular Disease

## 2020-05-18 NOTE — Progress Notes (Signed)
Patient ID: Steven [name], male   DOB: 10/18/1955, 65 y.o.   MRN: [id number]    Cardiology Office Note    Date:  05/18/2020   ID:  Steven [name] [birth year], MRN [id number]  PCP:  Lorelee Market, MD  Cardiologist:  Shelva Majestic, M.D.; Sanda Klein, MD   Chief Complaint  Patient presents with  . Congestive Heart Failure  . Coronary Artery Disease  . Pacemaker Check    ICD    History of Present Illness:  Steven [name] is a 64 y.o. male with severe ischemic cardiomyopathy following an extensive infarction in the LAD artery territory complicated by severe left ventricular systolic dysfunction, left ventricular apical thrombus and ventricular tachycardia. He received a defibrillator for primary prevention in 2012 [facility] [place], generator change 01/2019, leads 2012). He has never received device therapy to date, but his device has recorded fairly lengthy episodes of nonsustained ventricular tachycardia around the time when he suffered syncope and a subdural hematoma that required surgical evacuation.  After Dr. Claiborne Billings recently increased antianginal therapy by adding amlodipine, he has had very infrequent and very mild episodes of exertional angina.  He is also taking high-dose beta-blocker and long-acting nitrates.  He denies shortness of breath with activity and has not had edema, orthopnea or PND.  He denies palpitations, dizziness or syncope.  He does not have intermittent claudication and and denies any focal neurological complaints.  Echo performed in July 2021 showed EF 20-25%, dilated left ventricle with apical aneurysm, no evidence of fluid overload, no evidence of left ventricular thrombus.  He has a Medtronic Evera XT DR defibrillator: Estimated device longevity 9.1 years.  Lead parameters are excellent.  He has 82.5% atrial pacing and less than 0.1% ventricular pacing.  As before he has occasional episodes of nonsustained VT.  4 of these have been recorded so far in the first 11  months of this year (historically he has had about 1 episode a month).  The longest episode was 22 beats long in March.  The most recent episode was only 6-7 beats long and occurred in August.  OptiVol has been stable, no evidence of fluid overload.  Past Medical History:  Diagnosis Date  . Acute MI, anterior wall ([place])   . CAD (coronary artery disease)    2D ECHO, 07/13/2011 - EF <25%, LV moderatelty dilated, LA moderately dilatedLEXISCAN, 12/14/2011 - moderate-severe perfusion defect seen in the basal anteroseptal, mid anterior, apicacl anterior, apical, apical inferior, and apical lateral regions, post-stress EF 25%, new EKG changes from baseline abnormalities  . Inguinal hernia, left     Past Surgical History:  Procedure Laterality Date  . CARDIAC CATHETERIZATION  11/25/2010   Predilation balloon-Apex monorail 2x36mm, Cutting balloon-2.25x106mm, resulting in a reduction of 100% stenosis down to less than 10%  . CARDIAC CATHETERIZATION  07/12/2010   LAD stented with a 3.5x12mm bare-metal Veriflex stent resulting in a reduction of 100% lesion to 0%  . CARDIAC DEFIBRILLATOR PLACEMENT  03/02/2011   Medtronic Protecta XT DR, model R6821001, serial [id number]  . ICD GENERATOR CHANGEOUT N/A 02/24/2019   Procedure: ICD GENERATOR CHANGEOUT;  Surgeon: Sanda Klein, MD;  Location: [place] [facility];  Service: Cardiovascular;  Laterality: N/A;  . PACEMAKER PLACEMENT  07/14/2010   Temporary placement of pacemaker, if rhythm issue continues will need a permanent device    Outpatient Medications Prior to Visit  Medication Sig Dispense Refill  . albuterol (PROVENTIL HFA;VENTOLIN HFA) 108 (90 Base) MCG/ACT inhaler Inhale 1-2 puffs  into the lungs every 6 (six) hours as needed for wheezing or shortness of breath.    [name] albuterol (PROVENTIL) (2.5 MG/3ML) 0.083% nebulizer solution Take 2.5 mg by nebulization every 6 (six) hours as needed for wheezing or shortness of breath.    [name] amLODipine (NORVASC) 2.5 MG  tablet Take 1 tablet (2.5 mg total) by mouth daily. 30 tablet 11  . atorvastatin (LIPITOR) 40 MG tablet Take 1 tablet by mouth once daily (Patient taking differently: Take 40 mg by mouth daily. ) 90 tablet 1  . carvedilol (COREG) 25 MG tablet TAKE 1 TABLET BY MOUTH TWICE DAILY WITH A MEAL 180 tablet 2  . Cholecalciferol (VITAMIN D) 125 MCG (5000 UT) CAPS Take 5,000 Units by mouth daily.    . dapagliflozin propanediol (FARXIGA) 10 MG TABS tablet Take 10 mg by mouth daily.    . fluticasone (FLONASE) 50 MCG/ACT nasal spray Place 2 sprays into both nostrils daily.    . furosemide (LASIX) 40 MG tablet Take 1 tablet (40 mg total) by mouth daily. 30 tablet 4  . isosorbide mononitrate (IMDUR) 120 MG 24 hr tablet Take 1 tablet (120 mg total) by mouth daily. 90 tablet 3  . montelukast (SINGULAIR) 10 MG tablet Take 10 mg by mouth daily.    . Multiple Vitamin (MULTIVITAMIN) tablet Take 1 tablet by mouth daily.    . nitroGLYCERIN (NITROSTAT) 0.4 MG SL tablet DISSOLVE ONE TABLET UNDER THE TONGUE EVERY 5 MINUTES AS NEEDED FOR CHEST PAIN.  DO NOT EXCEED A TOTAL OF 3 DOSES IN 15 MINUTES 75 tablet 3  . sacubitril-valsartan (ENTRESTO) 24-26 MG Take 1 tablet by mouth 2 (two) times daily. 60 tablet 3  . spironolactone (ALDACTONE) 25 MG tablet Take 1/2 (one-half) tablet by mouth twice daily 90 tablet 3  . tamsulosin (FLOMAX) 0.4 MG CAPS capsule Take 0.4 mg by mouth at bedtime.    . Tiotropium Bromide-Olodaterol (STIOLTO RESPIMAT) 2.5-2.5 MCG/ACT AERS Inhale 2 puffs into the lungs daily.    [name] warfarin (COUMADIN) 5 MG tablet Take 1 to 2 tablets daily as directed by the coumadin clinic 90 tablet 0   No facility-administered medications prior to visit.     Allergies:   Patient has no known allergies.   Social History   Socioeconomic History  . Marital status: Married    Spouse name: Not on file  . Number of children: Not on file  . Years of education: Not on file  . Highest education level: Not on file    Occupational History  . Not on file  Tobacco Use  . Smoking status: Former Smoker    Types: Cigarettes, Cigars  . Smokeless tobacco: Never Used  Substance and Sexual Activity  . Alcohol use: Yes    Alcohol/week: 2.0 - 3.0 standard drinks    Types: 2 - 3 drink(s) per week  . Drug use: No  . Sexual activity: Not on file  Other Topics Concern  . Not on file  Social History Narrative  . Not on file   Social Determinants of Health   Financial Resource Strain:   . Difficulty of Paying Living Expenses: Not on file  Food Insecurity:   . Worried About Charity fundraiser in the Last Year: Not on file  . Ran Out of Food in the Last Year: Not on file  Transportation Needs:   . Lack of Transportation (Medical): Not on file  . Lack of Transportation (Non-Medical): Not on file  Physical Activity:   .  Days of Exercise per Week: Not on file  . Minutes of Exercise per Session: Not on file  Stress:   . Feeling of Stress : Not on file  Social Connections:   . Frequency of Communication with Friends and Family: Not on file  . Frequency of Social Gatherings with Friends and Family: Not on file  . Attends Religious Services: Not on file  . Active Member of Clubs or Organizations: Not on file  . Attends Archivist Meetings: Not on file  . Marital Status: Not on file     ROS:   Please see the history of present illness.    ROS All other systems reviewed and are negative.   PHYSICAL EXAM:   VS:  BP 122/68   Pulse 76   Ht 5\' 10"  (1.778 m)   Wt 177 lb 12.8 oz (80.6 kg)   SpO2 100%   BMI 25.51 kg/m     General: Alert, oriented x3, no distress, well-healed left subclavian defibrillator site Head: no evidence of trauma, PERRL, EOMI, no exophtalmos or lid lag, no myxedema, no xanthelasma; normal ears, nose and oropharynx Neck: normal jugular venous pulsations and no hepatojugular reflux; brisk carotid pulses without delay and no carotid bruits Chest: clear to auscultation, no  signs of consolidation by percussion or palpation, normal fremitus, symmetrical and full respiratory excursions Cardiovascular: normal position and quality of the apical impulse, regular rhythm, normal first and second heart sounds, no murmurs, rubs or gallops Abdomen: no tenderness or distention, no masses by palpation, no abnormal pulsatility or arterial bruits, normal bowel sounds, no hepatosplenomegaly Extremities: no clubbing, cyanosis or edema; 2+ radial, ulnar and brachial pulses bilaterally; 2+ right femoral, posterior tibial and dorsalis pedis pulses; 2+ left femoral, posterior tibial and dorsalis pedis pulses; no subclavian or femoral bruits Neurological: grossly nonfocal Psych: Normal mood and affect  Wt Readings from Last 3 Encounters:  05/17/20 177 lb 12.8 oz (80.6 kg)  04/27/20 178 lb 12.8 oz (81.1 kg)  04/12/20 180 lb (81.6 kg)      Studies/Labs Reviewed:   ECHO 01/05/2020: 1. Since the last study on 04/18/2017 remains very low at 20-25% with  apical aneurysm and no thrombus on Definity echocontrast images.  2. Left ventricular ejection fraction, by estimation, is 20 to 25%. The  left ventricle has severely decreased function. The left ventricle  demonstrates global hypokinesis. The left ventricular internal cavity size  was moderately dilated. Left  ventricular diastolic parameters are consistent with Grade I diastolic  dysfunction (impaired relaxation).  3. Right ventricular systolic function is normal. The right ventricular  size is normal. There is normal pulmonary artery systolic pressure. The  estimated right ventricular systolic pressure is 09.6 mmHg.  4. The mitral valve is normal in structure. Mild mitral valve  regurgitation. No evidence of mitral stenosis.  5. The aortic valve is normal in structure. Aortic valve regurgitation is  not visualized. No aortic stenosis is present.  6. The inferior vena cava is normal in size with greater than 50%    respiratory variability, suggesting right atrial pressure of 3 mmHg.   EKG:  EKG is not ordered today.  Intracardiac electrogram shows atrial paced, ventricular sensed rhythm.  Lipid Panel    Component Value Date/Time   CHOL 142 04/23/2020 0915   CHOL 161 07/01/2013 0838   TRIG 86 04/23/2020 0915   TRIG 108 07/01/2013 0838   HDL 48 04/23/2020 0915   HDL 43 07/01/2013 0838   CHOLHDL 3.0 04/23/2020  0915   CHOLHDL 3.3 09/03/2014 0803   VLDL 16 09/03/2014 0803   LDLCALC 78 04/23/2020 0915   LDLCALC 96 07/01/2013 0838    ASSESSMENT:    1. Chronic combined systolic and diastolic heart failure ([place])   2. Coronary artery disease of native artery of native heart with stable angina pectoris ([place])   3. VT (ventricular tachycardia) ([place])   4. Implantable cardioverter-defibrillator (ICD) in situ   5. Left ventricular thrombosis following MI ([place])   6. Long term current use of anticoagulant therapy   7. Hyperlipidemia LDL goal <70      PLAN:  In order of problems listed above:  1. CHF: NYHA functional class I-2, euvolemic clinically and by OptiVol measurements.  Severely depressed left ventricular systolic function.    On appropriate heart failure medications in maximally tolerated doses. 2. CAD: CCS functional class I-II on 3 different antianginal medications.  Thankfully, without complaints of orthostatic hypotension at this time. 3. VT: Historically has had about 1 episode of nonsustained VT every month, only for events in the last 11 months.  In the past, he had symptomatic nonsustained VT that may have led to his fall and head injury. 4. ICD: Recent generator change out.  Normal device function.  Continue remote downloads every 3 months.   5. LV thrombus: Not confirmed on most recent echocardiogram.   6. Anticoagulation: On chronic warfarin anticoagulation without bleeding complications.  INR 2.6 today. 7. HLP: On statin, LDL not quite at target (less than 70) on recent  labs.   Medication Adjustments/Labs and Tests Ordered: Current medicines are reviewed at length with the patient today.  Concerns regarding medicines are outlined above.  Medication changes, Labs and Tests ordered today are listed in the Patient Instructions below. Patient Instructions  Medication Instructions:  No changes *If you need a refill on your cardiac medications before your next appointment, please call your pharmacy*   Lab Work: None ordered If you have labs (blood work) drawn today and your tests are completely normal, you will receive your results only by: [name] MyChart Message (if you have MyChart) OR . A paper copy in the mail If you have any lab test that is abnormal or we need to change your treatment, we will call you to review the results.   Testing/Procedures: None ordered   Follow-Up: At [facility], you and your health needs are our priority.  As part of our continuing mission to provide you with exceptional heart care, we have created designated Provider Care Teams.  These Care Teams include your primary Cardiologist (physician) and Advanced Practice Providers (APPs -  Physician Assistants and Nurse Practitioners) who all work together to provide you with the care you need, when you need it.  We recommend signing up for the patient portal called "MyChart".  Sign up information is provided on this After Visit Summary.  MyChart is used to connect with patients for Virtual Visits (Telemedicine).  Patients are able to view lab/test results, encounter notes, upcoming appointments, etc.  Non-urgent messages can be sent to your provider as well.   To learn more about what you can do with MyChart, go to [email or web address].    Your next appointment:   12 month(s)  The format for your next appointment:   In Person  Provider:   Sanda Klein, MD        Signed, Sanda Klein, MD  05/18/2020 9:51 AM    [address],  [place], [place]  [address] Phone: [phone]; Fax: [phone]

## 2020-05-25 ENCOUNTER — Ambulatory Visit (INDEPENDENT_AMBULATORY_CARE_PROVIDER_SITE_OTHER): Payer: Medicare Other

## 2020-05-25 DIAGNOSIS — I255 Ischemic cardiomyopathy: Secondary | ICD-10-CM | POA: Diagnosis not present

## 2020-05-25 LAB — CUP PACEART REMOTE DEVICE CHECK
Battery Remaining Longevity: 108 mo
Battery Voltage: 3.02 V
Brady Statistic AP VP Percent: 0.02 %
Brady Statistic AP VS Percent: 76.31 %
Brady Statistic AS VP Percent: 0.02 %
Brady Statistic AS VS Percent: 23.66 %
Brady Statistic RA Percent Paced: 76.03 %
Brady Statistic RV Percent Paced: 0.04 %
Date Time Interrogation Session: 20211130042208
HighPow Impedance: 49 Ohm
HighPow Impedance: 66 Ohm
Implantable Lead Implant Date: 20120906
Implantable Lead Implant Date: 20120906
Implantable Lead Location: 753859
Implantable Lead Location: 753860
Implantable Lead Model: 185
Implantable Lead Model: 5076
Implantable Lead Serial Number: 358872
Implantable Pulse Generator Implant Date: 20200831
Lead Channel Impedance Value: 342 Ohm
Lead Channel Impedance Value: 380 Ohm
Lead Channel Impedance Value: 399 Ohm
Lead Channel Pacing Threshold Amplitude: 0.375 V
Lead Channel Pacing Threshold Amplitude: 0.75 V
Lead Channel Pacing Threshold Pulse Width: 0.4 ms
Lead Channel Pacing Threshold Pulse Width: 0.4 ms
Lead Channel Sensing Intrinsic Amplitude: 2.25 mV
Lead Channel Sensing Intrinsic Amplitude: 2.25 mV
Lead Channel Sensing Intrinsic Amplitude: 5.625 mV
Lead Channel Sensing Intrinsic Amplitude: 5.625 mV
Lead Channel Setting Pacing Amplitude: 1.5 V
Lead Channel Setting Pacing Amplitude: 2.5 V
Lead Channel Setting Pacing Pulse Width: 0.4 ms
Lead Channel Setting Sensing Sensitivity: 0.3 mV

## 2020-05-31 NOTE — Progress Notes (Signed)
Remote ICD transmission.   

## 2020-06-07 ENCOUNTER — Other Ambulatory Visit: Payer: Self-pay

## 2020-06-07 ENCOUNTER — Ambulatory Visit (INDEPENDENT_AMBULATORY_CARE_PROVIDER_SITE_OTHER): Payer: Medicare Other

## 2020-06-07 DIAGNOSIS — I513 Intracardiac thrombosis, not elsewhere classified: Secondary | ICD-10-CM | POA: Diagnosis not present

## 2020-06-07 DIAGNOSIS — Z7901 Long term (current) use of anticoagulants: Secondary | ICD-10-CM | POA: Diagnosis not present

## 2020-06-07 LAB — POCT INR: INR: 3.8 — AB (ref 2.0–3.0)

## 2020-06-07 NOTE — Patient Instructions (Signed)
Hold  tonight and then Continue taking 1 tablet daily except 1/2 tablet each Wednesdays  Repeat INR in 6 weeks

## 2020-06-11 ENCOUNTER — Telehealth: Payer: Self-pay | Admitting: Cardiovascular Disease

## 2020-06-11 MED ORDER — WARFARIN SODIUM 5 MG PO TABS: ORAL_TABLET | ORAL | 1 refills | Status: DC

## 2020-06-11 NOTE — Telephone Encounter (Signed)
*  STAT* If patient is at the pharmacy, call can be transferred to refill team.   1. Which medications need to be refilled? (please list name of each medication and dose if known) Warfarin  2. Which pharmacy/location (including street and city if local pharmacy) is medication to be sent to? [facility]   3. Do they need a 30 day or 90 day supply? [place]

## 2020-06-14 ENCOUNTER — Other Ambulatory Visit: Payer: Self-pay

## 2020-06-14 MED ORDER — WARFARIN SODIUM 5 MG PO TABS: ORAL_TABLET | ORAL | 0 refills | Status: DC

## 2020-07-21 ENCOUNTER — Ambulatory Visit (INDEPENDENT_AMBULATORY_CARE_PROVIDER_SITE_OTHER): Payer: 59 | Admitting: Cardiovascular Disease

## 2020-07-21 ENCOUNTER — Other Ambulatory Visit: Payer: Self-pay

## 2020-07-21 ENCOUNTER — Ambulatory Visit (INDEPENDENT_AMBULATORY_CARE_PROVIDER_SITE_OTHER): Payer: Medicaid Other

## 2020-07-21 VITALS — BP 94/63 | HR 61 | Ht 70.0 in | Wt 184.6 lb

## 2020-07-21 DIAGNOSIS — I472 Ventricular tachycardia, unspecified: Secondary | ICD-10-CM

## 2020-07-21 DIAGNOSIS — I255 Ischemic cardiomyopathy: Secondary | ICD-10-CM | POA: Diagnosis not present

## 2020-07-21 DIAGNOSIS — I952 Hypotension due to drugs: Secondary | ICD-10-CM

## 2020-07-21 DIAGNOSIS — I5042 Chronic combined systolic (congestive) and diastolic (congestive) heart failure: Secondary | ICD-10-CM | POA: Diagnosis not present

## 2020-07-21 DIAGNOSIS — E785 Hyperlipidemia, unspecified: Secondary | ICD-10-CM

## 2020-07-21 DIAGNOSIS — Z95810 Presence of automatic (implantable) cardiac defibrillator: Secondary | ICD-10-CM

## 2020-07-21 DIAGNOSIS — Z7901 Long term (current) use of anticoagulants: Secondary | ICD-10-CM | POA: Diagnosis not present

## 2020-07-21 DIAGNOSIS — I513 Intracardiac thrombosis, not elsewhere classified: Secondary | ICD-10-CM | POA: Diagnosis not present

## 2020-07-21 DIAGNOSIS — Z79899 Other long term (current) drug therapy: Secondary | ICD-10-CM

## 2020-07-21 LAB — POCT INR: INR: 2.3 (ref 2.0–3.0)

## 2020-07-21 MED ORDER — FUROSEMIDE 20 MG PO TABS: 20.0000 mg | ORAL_TABLET | Freq: Every day | ORAL | 6 refills | Status: DC

## 2020-07-21 MED ORDER — SPIRONOLACTONE 25 MG PO TABS: 25.0000 mg | ORAL_TABLET | Freq: Every day | ORAL | 6 refills | Status: DC

## 2020-07-21 NOTE — Patient Instructions (Signed)
Continue taking 1 tablet daily except 1/2 tablet each Wednesdays  Repeat INR in 6 weeks   

## 2020-07-21 NOTE — Progress Notes (Signed)
Cardiology Office Note    Date:  07/22/2020   ID:  Steven, [name] 1955-07-03, MRN [id number]  PCP:  Lorelee Market, MD  Cardiologist:  Shelva Majestic, MD    History of Present Illness:  Steven [name] is a 65 y.o. male with documented severe ischemic cardiomyopathy following an extensive anterior myocardial infarction with subsequent defibrillator for primary prevention.    He presents for 3 month follow-up cardiology evaluation.  Steven [name] is originally from [place] and suffered a large out of hospital anterior wall myocardial infarction in January 2012. [name] He presented after a three-hour delay to the hospital and was taken emergently to the cardiac catheterization laboratory where I performed successful emergent intervention with the door to balloon time of only 25 minutes. His LAD was successfully recanalized. CPK increased to 8012 with an MB of 716 and troponin was greater than 100 initially. He subsequently wore a life vest and ultimately after several months without significant improvement of LV function he underwent implantable cardio defibrillator insertion. Interrogation of his defibrillator in the past had shown episodes of nonsustained VT and his dose of carvedilol has been further titrated.  A followup echo Doppler study on 08/04/2013 showed an ejection fraction of 20-25% with diffuse hypokinesis but there was akinesis of the entire anteroseptal myocardium and apical region. There now appear to be an apparent medium-sized mural apical thrombus which was not noted on his last echo. He denies paresthesias. He denies episodes of chest pain. He denies shortness of breath.At his last office visit in February in light of his moderate sized mural thrombus, Plavix, was discontinued, and he was started on Coumadin therapy.  In August 2015 he tripped and hit his head on a bar recently developed severe headache, transient leg weakness, which evolves into slurred speech. He underwent  evaluation 1-1/2 weeks later and was found to have a subdural hematoma. He underwent a right burr hole subdural hematoma evacuation on 02/05/2014 by [name] at [facility]. Initially aspirin and Coumadin were on hold. He had a follow-up CT scan and ultimately was given clearance to reinstitute Coumadin anticoagulation in light of his severe cardiomyopathy with an ejection fraction of 15-20% on echo on 03/13/2014 and medium-sized apical mural thrombus which had been documented on a previous echo. This ultimately has stabilized and he has been back on warfarin anticoagulation.   When I evaluated him in 2016 he denied any chest pain, shortness of breath or recurrent palpitations. His ICD was last interrogated one month ago by Dr. [name]. He was noted to have very short nonsustained bursts of nonsustained VT with one episode lasting 8 beats at 212 bpm on 07/09/2014. His device was adjusted so as it would intervene more quickly and avoid syncope.   I saw him in September 2018 and prior to that evaluation had not seen him since 2016. Over this time period, he has been without recurrent chest pain. At times he has had some occasional dizziness and weakness. He saw Dr. Sallyanne Kuster on August 09 2016. His device was interrogated and showed 72% atrial pacing and no ventricular pacing. There was one 8 beat episode of nonsustained VT since his last device check. His last echo Doppler study was in September 2015. He was evaluated by Dr. Anastasia Fiedler insertion of apenile implantation. Preoperative clearance was given by Dr. Sallyanne Kuster was advised that he hold his warfarin and he will stop his aspirin permanently. He was seen at [facility] [place] and was felt to  have stable stage III chronic kidney disease. His blood pressure was controlled and he was euvolemic.   He underwent an echo Doppler study on April 18, 2017. This continued to show reduced ejection fraction at 20-25%.  He had significant wall motion abnormality with akinesis of the apical anterior septum, inferolateral wall, and the entire apex. There was grade 1 diastolic dysfunction. He was seen by Dr. Sallyanne Kuster in February 2019. At that time, his ICD had normal device function. He has been undergoing remote downloads every 3 months via [facility]. He has not experienced any episodes of recent angina. He was found to have rare episodes of nonsustained VT only 3 in the last 12 months. At times he notes some mild dizziness if he stands up very fast.  I saw him in April 2019.  Since that time, he has had evaluations with Dr. Sallyanne Kuster and was not device dependent but had 80% atrial pacing.  His device had recorded lengthy episodes of NSVT but had not delivered therapies.  On February 24, 2019 he underwent dual-chamber ICD generator change out due to the device reaching ERI.  He saw Dr. Sallyanne Kuster in follow-up in November 2020.  He has had issues with mild exertionally precipitated chest tightness which has been a chronic problem for some time and over several follow-up visits with Dr. Sallyanne Kuster he was started on isosorbide and ultimately titrated to 120 mg daily.  I had not seen him since 2019 but evaluated him in a telemedicine visit on December 04, 2019.  At that time he continued to have chronic class II stable anginal symptomatology precipitated by increasing activity.    During that evaluation I recommend the addition of low-dose amlodipine 2.5 mg and he feels this improved his mild chronic anginal symptomatology.   He is unaware of any recurrent palpitations or defibrillator discharge.  He has continued to be on warfarin anticoagulation.    I last saw him in October 2021 and since his prior evaluation he had undergone a prostate biopsy for which warfarin was held.  He is followed by Dr. Junious Silk.  He underwent a repeat echo Doppler study on January 05, 2020 which continue to show very low EF at 20 to 25% with global  hypokinesis.  Estimated RV pressure was normal at 23 mm.  He continued to experience some exertional dyspnea and denied  any chest pain.  During my evaluation, I recommended transition to [facility] and he was instructed to discontinue lisinopril for a minimum of 36 hours prior to initiation.  He has been on Farxiga both for his borderline diabetes mellitus as well as CHF.  He was evaluated by Dr. Sallyanne Kuster on May 17, 2020 and had functional class I-II was euvolemic clinically and by OptiVol measurements.  Historicallyhe has had approximately 1 episode of nonsustained VT every month but had only 4 events in the last 11 months.  Presently, he has noticed some mild dizziness.  Has been on amlodipine 2.5 mg daily, carvedilol 25 mg twice a day, Farxiga 10 mg, Entresto 24/26 mg twice a day, furosemide 40 mg, isosorbide 120 mg, spironolactone 12.5 mg.  Denies any leg swelling or increasing shortness of breath.  He presents for reevaluation.   Past Medical History:  Diagnosis Date  . Acute MI, anterior wall ([place])   . CAD (coronary artery disease)    2D ECHO, 07/13/2011 - EF <25%, LV moderatelty dilated, LA moderately dilatedLEXISCAN, 12/14/2011 - moderate-severe perfusion defect seen in the basal anteroseptal, mid anterior, apicacl anterior, apical,  apical inferior, and apical lateral regions, post-stress EF 25%, new EKG changes from baseline abnormalities  . Inguinal hernia, left     Past Surgical History:  Procedure Laterality Date  . CARDIAC CATHETERIZATION  11/25/2010   Predilation balloon-Apex monorail 2x58mm, Cutting balloon-2.25x26mm, resulting in a reduction of 100% stenosis down to less than 10%  . CARDIAC CATHETERIZATION  07/12/2010   LAD stented with a 3.5x44mm bare-metal Veriflex stent resulting in a reduction of 100% lesion to 0%  . CARDIAC DEFIBRILLATOR PLACEMENT  03/02/2011   Medtronic Protecta XT DR, model [id number], serial [id number]  . ICD GENERATOR CHANGEOUT N/A 02/24/2019   Procedure:  ICD GENERATOR CHANGEOUT;  Surgeon: Sanda Klein, MD;  Location: [place] [facility];  Service: Cardiovascular;  Laterality: N/A;  . PACEMAKER PLACEMENT  07/14/2010   Temporary placement of pacemaker, if rhythm issue continues will need a permanent device    Current Medications: Outpatient Medications Prior to Visit  Medication Sig Dispense Refill  . albuterol (PROVENTIL HFA;VENTOLIN HFA) 108 (90 Base) MCG/ACT inhaler Inhale 1-2 puffs into the lungs every 6 (six) hours as needed for wheezing or shortness of breath.    [name] albuterol (PROVENTIL) (2.5 MG/3ML) 0.083% nebulizer solution Take 2.5 mg by nebulization every 6 (six) hours as needed for wheezing or shortness of breath.    [name] amLODipine (NORVASC) 2.5 MG tablet Take 1 tablet (2.5 mg total) by mouth daily. 30 tablet 11  . atorvastatin (LIPITOR) 40 MG tablet Take 1 tablet by mouth once daily (Patient taking differently: Take 40 mg by mouth daily.) 90 tablet 1  . carvedilol (COREG) 25 MG tablet TAKE 1 TABLET BY MOUTH TWICE DAILY WITH A MEAL 180 tablet 2  . Cholecalciferol (VITAMIN D) 125 MCG (5000 UT) CAPS Take 5,000 Units by mouth daily.    . dapagliflozin propanediol (FARXIGA) 10 MG TABS tablet Take 10 mg by mouth daily.    . fluticasone (FLONASE) 50 MCG/ACT nasal spray Place 2 sprays into both nostrils daily.    . montelukast (SINGULAIR) 10 MG tablet Take 10 mg by mouth daily.    . Multiple Vitamin (MULTIVITAMIN) tablet Take 1 tablet by mouth daily.    . nitroGLYCERIN (NITROSTAT) 0.4 MG SL tablet DISSOLVE ONE TABLET UNDER THE TONGUE EVERY 5 MINUTES AS NEEDED FOR CHEST PAIN.  DO NOT EXCEED A TOTAL OF 3 DOSES IN 15 MINUTES 75 tablet 3  . sacubitril-valsartan (ENTRESTO) 24-26 MG Take 1 tablet by mouth 2 (two) times daily. 60 tablet 3  . tamsulosin (FLOMAX) 0.4 MG CAPS capsule Take 0.4 mg by mouth at bedtime.    . Tiotropium Bromide-Olodaterol (STIOLTO RESPIMAT) 2.5-2.5 MCG/ACT AERS Inhale 2 puffs into the lungs daily.    [name] warfarin (COUMADIN) 5  MG tablet Take 1 to 2 tablets daily as directed by the coumadin clinic 90 tablet 0  . spironolactone (ALDACTONE) 25 MG tablet Take 1/2 (one-half) tablet by mouth twice daily 90 tablet 3  . isosorbide mononitrate (IMDUR) 120 MG 24 hr tablet Take 1 tablet (120 mg total) by mouth daily. 90 tablet 3  . furosemide (LASIX) 40 MG tablet Take 1 tablet (40 mg total) by mouth daily. (Patient not taking: Reported on 07/21/2020) 30 tablet 4   No facility-administered medications prior to visit.     Allergies:   Patient has no known allergies.   Social History   Socioeconomic History  . Marital status: Married    Spouse name: Not on file  . Number of children: Not on file  .  Years of education: Not on file  . Highest education level: Not on file  Occupational History  . Not on file  Tobacco Use  . Smoking status: Former Smoker    Types: Cigarettes, Cigars  . Smokeless tobacco: Never Used  Substance and Sexual Activity  . Alcohol use: Yes    Alcohol/week: 2.0 - 3.0 standard drinks    Types: 2 - 3 drink(s) per week  . Drug use: No  . Sexual activity: Not on file  Other Topics Concern  . Not on file  Social History Narrative  . Not on file   Social Determinants of Health   Financial Resource Strain: Not on file  Food Insecurity: Not on file  Transportation Needs: Not on file  Physical Activity: Not on file  Stress: Not on file  Social Connections: Not on file    Socially he is single, he was born in [place].  He has 1 child.  There is remote tobacco history.    Family History:  The patient's family history is not on file.   ROS General: Negative; No fevers, chills, or night sweats;  HEENT: Negative; No changes in vision or hearing, sinus congestion, difficulty swallowing Pulmonary: Negative; No cough, wheezing, shortness of breath, hemoptysis Cardiovascular: See HPI GI: Negative; No nausea, vomiting, diarrhea, or abdominal pain GU: Positive for prostate biopsy, erectile  dysfunction; No dysuria, hematuria, or difficulty voiding Musculoskeletal: Negative; no myalgias, joint pain, or weakness Hematologic/Oncology: Negative; no easy bruising, bleeding Endocrine: Negative; no heat/cold intolerance; no diabetes Neuro: Negative; no changes in balance, headaches Skin: Negative; No rashes or skin lesions Psychiatric: Negative; No behavioral problems, depression Sleep: Negative; No snoring, daytime sleepiness, hypersomnolence, bruxism, restless legs, hypnogognic hallucinations, no cataplexy Other comprehensive 14 point system review is negative.   PHYSICAL EXAM:   VS:  BP 94/63 (BP Location: Left Arm, Patient Position: Sitting)   Pulse 61   Ht $R'5\' 10"'VQ$  (1.778 m)   Wt 184 lb 9.6 oz (83.7 kg)   SpO2 99%   BMI 26.49 kg/m     Repeat blood pressure by me was 98/68  Wt Readings from Last 3 Encounters:  07/21/20 184 lb 9.6 oz (83.7 kg)  05/17/20 177 lb 12.8 oz (80.6 kg)  04/27/20 178 lb 12.8 oz (81.1 kg)    General: Alert, oriented, no distress.  Skin: normal turgor, no rashes, warm and dry HEENT: Normocephalic, atraumatic. Pupils equal round and reactive to light; sclera anicteric; extraocular muscles intact;  Nose without nasal septal hypertrophy Mouth/Parynx benign; Mallinpatti scale 3 Neck: No JVD, no carotid bruits; normal carotid upstroke Lungs: clear to ausculatation and percussion; no wheezing or rales Chest wall: without tenderness to palpitation Heart: PMI not displaced, RRR, s1 s2 normal, 1/6 systolic murmur, no diastolic murmur, no rubs, gallops, thrills, or heaves Abdomen: soft, nontender; no hepatosplenomehaly, BS+; abdominal aorta nontender and not dilated by palpation. Back: no CVA tenderness Pulses 2+ Musculoskeletal: full range of motion, normal strength, no joint deformities Extremities: no clubbing cyanosis or edema, Homan's sign negative  Neurologic: grossly nonfocal; Cranial nerves grossly wnl Psychologic: Normal mood and  affect    Studies/Labs Reviewed:   EKG:  EKG is ordered today.  ECG (independently read by me): Atrial paced, prolonged AV conduction,PR 246 msec , LAHB, Anterolateral ST changes  April 12, 2020 ECG (independently read by me): Atrial paced at 63; old anterolateral infarct,  LAHB, QTc 417 msec  April 3 2021ECG (independently read by me): Atrially paced rhythm at 72 bpm.  One PVC.  Poor progression consistent with old anterior infarction.  Anterolateral ST-T changes  September 2016 ECG (independently read by me): Atrially paced rhythm at 64 bpm.  No ectopy.  Poor anterior R-wave progression concordant with old anterior MI.  March 2016 ECG (independently read by me): Atrially paced rhythm at 74 bpm.  Increased PR interval at 212 ms.  ST-T changes anterolaterally.  November 2015 ECG (independently read by me): Atrially paced rhythm.  Old anterior wall MI.  Per Ms. Lee noted ST-T changes laterally  Prior September 2015 ECG (independently read by me): Sinus rhythm at 63 beats per minute.  Old anterior wall myocardial infarction, poor R. progression V1 through V4 and T-wave changes V3 through V6, leads 1, and L.    08/18/2013 ECG (independently read by me):  Atrially paced at 64 beats per minute. QTc interval 427 ms. QRS duration 98 ms. Diffuse anterolateral T-wave changes secondary to his prior MI   Prior ECG of 02/27/2013: Atrial  paced rhythm at 64 beats per minute. Evidence for his prior anterolateral wall myocardial infarction with diffuse T-wave abnormality and precordial Q waves V1 through V5   Recent Labs: BMP Latest Ref Rng & Units 04/23/2020 02/19/2019 08/29/2017  Glucose 65 - 99 mg/dL 79 94 99  BUN 8 - 27 mg/dL $Remove'23 18 21  'nPnqKVY$ Creatinine 0.76 - 1.27 mg/dL 1.49(H) 1.41(H) 1.73(H)  BUN/Creat Ratio 10 - $Re'24 15 13 12  'hpA$ Sodium 134 - 144 mmol/L 134 134 133(L)  Potassium 3.5 - 5.2 mmol/L 4.5 4.5 4.6  Chloride 96 - 106 mmol/L 99 100 98  CO2 20 - 29 mmol/L 22 19(L) 20  Calcium 8.6 -  10.2 mg/dL 9.5 9.2 9.4     Hepatic Function Latest Ref Rng & Units 04/23/2020 08/29/2017 09/03/2014  Total Protein 6.0 - 8.5 g/dL 7.3 7.7 6.8  Albumin 3.8 - 4.8 g/dL 4.3 4.3 4.1  AST 0 - 40 IU/L $Remov'25 25 25  'CfVntO$ ALT 0 - 44 IU/L $Remov'13 12 17  'MFXIGW$ Alk Phosphatase 44 - 121 IU/L 65 55 50  Total Bilirubin 0.0 - 1.2 mg/dL 0.7 0.5 0.5  Bilirubin, Direct 0.0 - 0.3 mg/dL - - -    CBC Latest Ref Rng & Units 04/23/2020 02/19/2019 09/03/2014  WBC 3.4 - 10.8 x10E3/uL 5.5 6.1 6.2  Hemoglobin 13.0 - 17.7 g/dL 15.0 14.6 15.9  Hematocrit 37.5 - 51.0 % 45.0 42.4 46.7  Platelets 150 - 450 x10E3/uL 149(L) 156 165   Lab Results  Component Value Date   MCV 95 04/23/2020   MCV 92 02/19/2019   MCV 93.4 09/03/2014   Lab Results  Component Value Date   TSH 2.280 04/23/2020   Lab Results  Component Value Date   HGBA1C (H) 11/23/2010    5.8 (NOTE)                                                                       According to the ADA Clinical Practice Recommendations for 2011, when HbA1c is used as a screening test:   >=6.5%   Diagnostic of Diabetes Mellitus           (if abnormal result  is confirmed)  5.7-6.4%   Increased risk of developing Diabetes Mellitus  References:Diagnosis and Classification  of Diabetes Mellitus,Diabetes GGEZ,6629,47(MLYYT 1):S62-S69 and Standards of Medical Care in         Diabetes - 2011,Diabetes KPTW,6568,12  (Suppl 1):S11-S61.     BNP    Component Value Date/Time   BNP 124.2 (H) 09/03/2014 0803    ProBNP    Component Value Date/Time   PROBNP 211 04/23/2020 0915   PROBNP 9719.0 (H) 11/23/2010 2006     Lipid Panel     Component Value Date/Time   CHOL 142 04/23/2020 0915   CHOL 161 07/01/2013 0838   TRIG 86 04/23/2020 0915   TRIG 108 07/01/2013 0838   HDL 48 04/23/2020 0915   HDL 43 07/01/2013 0838   CHOLHDL 3.0 04/23/2020 0915   CHOLHDL 3.3 09/03/2014 0803   VLDL 16 09/03/2014 0803   LDLCALC 78 04/23/2020 0915   LDLCALC 96 07/01/2013 0838   LABVLDL 16 04/23/2020  0915     RADIOLOGY: No results found.   Additional studies/ records that were reviewed today include:   ECHO 01/05/2020 IMPRESSIONS  1. Since the last study on 04/18/2017 remains very low at 20-25% with  apical aneurysm and no thrombus on Definity echocontrast images.  2. Left ventricular ejection fraction, by estimation, is 20 to 25%. The  left ventricle has severely decreased function. The left ventricle  demonstrates global hypokinesis. The left ventricular internal cavity size  was moderately dilated. Left  ventricular diastolic parameters are consistent with Grade I diastolic  dysfunction (impaired relaxation).  3. Right ventricular systolic function is normal. The right ventricular  size is normal. There is normal pulmonary artery systolic pressure. The  estimated right ventricular systolic pressure is 75.1 mmHg.  4. The mitral valve is normal in structure. Mild mitral valve  regurgitation. No evidence of mitral stenosis.  5. The aortic valve is normal in structure. Aortic valve regurgitation is  not visualized. No aortic stenosis is present.  6. The inferior vena cava is normal in size with greater than 50%  respiratory variability, suggesting right atrial pressure of 3 mmHg.    ASSESSMENT:    1. Chronic combined systolic and diastolic heart failure (HCC)   2. Cardiomyopathy, ischemic   3. VT (ventricular tachycardia) ([place])   4. Hypotension due to drugs   5. Medication management   6. Implantable cardioverter-defibrillator (ICD) in situ   7. Hyperlipidemia with target LDL less than 70     PLAN:  1.  CAD: He suffered a large anterior wall myocardial infarction with late presentation in January 2012.  He continues to be on anti-ischemic medications has been without recurrent anginal symptomatology.  He believes the addition of amlodipine 2.5 mg to his isosorbide and carvedilol was successful in reducing recurrent anginal symptoms.  2.  Ischemic cardiomyopathy:  He had been previously demonstrated to have an EF of 20 to 25%.  Initially had also developed apical thrombus for which he was on warfarin therapy.  I reviewed his most recent echo Doppler study from January 05 2020-week which again shows EF of 20 to 25%.  He has been on lisinopril 5 mg daily in addition to spironolactone as well as carvedilol and Farxiga for CHF in addition to his isosorbide and amlodipine.  When I last evaluated him in October 2021 with his continued reduced LV function I recommended discontinuance of lisinopril and transition to [facility].  He is also on STS T-2 inhibition with Farxiga 10 mg daily as well as spironolactone 12.5 mg and has been taking furosemide 40 mg.  His pulse is  stable with carvedilol 25 mg twice daily and he is atrially paced rhythm.  His blood pressure today is low.  He is euvolemic.  I have suggested he reduce his furosemide from 40 mg down to 20 mg daily.  I am also reducing his spironolactone from 12.5 mg twice a day down to 12.5 mg daily.  If blood pressure remains low further dose adjustment will be necessary.  I will recheck a be met in BNP.  3: ICD: He underwent a generator change out for his dual-chamber ICD in August 2020.  He is followed by Dr. Sallyanne Kuster with pacemaker checks at 83-month intervals.  Reviewed the most recent evaluation by Dr. Sallyanne Kuster from November 2021.  His previous episodes of nonsustained ventricular tachycardia have reduced.  4.  Warfarin anticoagulation.  Previously he was found to have a moderate size apical mural thrombus; not confirmed on his most recent echo.  5: Hyperlipidemia.  He is on atorvastatin 40 mg.  LDL cholesterol in October 2021 was 78.  6.  History of nonsustained VT documented prior to his generator change.  He is undergoing device checks at 61-month intervals.  I will see him in 3 months for follow-up evaluation.  Medication Adjustments/Labs and Tests Ordered: Current medicines are reviewed at length with the patient  today.  Concerns regarding medicines are outlined above.  Medication changes, Labs and Tests ordered today are listed in the Patient Instructions below. Patient Instructions  Medication Instructions:  Decrease lasix 20mg  daily  Decrease spironolactone 25mg  daily  *If you need a refill on your cardiac medications before your next appointment, please call your pharmacy*  Lab Work: BMET AND BNP THESE ARE NON FASTING AT LEAST 3 DAYS BEFORE PHARMACIST APPOINTMENT. If you have labs (blood work) drawn today and your tests are completely normal, you will receive your results only by:  [place] (if you have MyChart) OR A paper copy in the mail.  If you have any lab test that is abnormal or we need to change your treatment, we will call you to review the results. You may go to any [facility] that is convenient for you however, we do have a lab in our office that is able to assist you. You DO NOT need an appointment for our lab. The lab is open 8:00am and closes at 4:00pm. Lunch 12:45 - 1:45pm.  Follow-Up: Follow up with [facility] for medication management 4-6 weeks.  Your next appointment:  3 month(s) In Person with Shelva Majestic, MD   At [facility], you and your health needs are our priority.  As part of our continuing mission to provide you with exceptional heart care, we have created designated Provider Care Teams.  These Care Teams include your primary Cardiologist (physician) and Advanced Practice Providers (APPs -  Physician Assistants and Nurse Practitioners) who all work together to provide you with the care you need, when you need it.  We recommend signing up for the patient portal called "MyChart".  Sign up information is provided on this After Visit Summary.  MyChart is used to connect with patients for Virtual Visits (Telemedicine).  Patients are able to view lab/test results, encounter notes, upcoming appointments, etc.  Non-urgent messages can be sent to your provider as well.   To learn  more about what you can do with MyChart, go to [email or web address].          Signed, Shelva Majestic, MD  07/22/2020 5:38 PM    [facility] [address],  [address], [place], [place]  [address] Phone: [phone]

## 2020-07-21 NOTE — Patient Instructions (Addendum)
Medication Instructions:  Decrease lasix 20mg  daily  Decrease spironolactone 25mg  daily  *If you need a refill on your cardiac medications before your next appointment, please call your pharmacy*  Lab Work: BMET AND BNP THESE ARE NON FASTING AT LEAST 3 DAYS BEFORE PHARMACIST APPOINTMENT. If you have labs (blood work) drawn today and your tests are completely normal, you will receive your results only by:  [place] (if you have MyChart) OR A paper copy in the mail.  If you have any lab test that is abnormal or we need to change your treatment, we will call you to review the results. You may go to any [facility] that is convenient for you however, we do have a lab in our office that is able to assist you. You DO NOT need an appointment for our lab. The lab is open 8:00am and closes at 4:00pm. Lunch 12:45 - 1:45pm.  Follow-Up: Follow up with [facility] for medication management 4-6 weeks.  Your next appointment:  3 month(s) In Person with Shelva Majestic, MD   At [facility], you and your health needs are our priority.  As part of our continuing mission to provide you with exceptional heart care, we have created designated Provider Care Teams.  These Care Teams include your primary Cardiologist (physician) and Advanced Practice Providers (APPs -  Physician Assistants and Nurse Practitioners) who all work together to provide you with the care you need, when you need it.  We recommend signing up for the patient portal called "MyChart".  Sign up information is provided on this After Visit Summary.  MyChart is used to connect with patients for Virtual Visits (Telemedicine).  Patients are able to view lab/test results, encounter notes, upcoming appointments, etc.  Non-urgent messages can be sent to your provider as well.   To learn more about what you can do with MyChart, go to [email or web address].

## 2020-07-22 ENCOUNTER — Encounter: Payer: Self-pay | Admitting: Cardiovascular Disease

## 2020-08-24 ENCOUNTER — Ambulatory Visit (INDEPENDENT_AMBULATORY_CARE_PROVIDER_SITE_OTHER): Payer: Medicaid Other

## 2020-08-24 DIAGNOSIS — I255 Ischemic cardiomyopathy: Secondary | ICD-10-CM

## 2020-08-24 LAB — CUP PACEART REMOTE DEVICE CHECK
Battery Remaining Longevity: 104 mo
Battery Voltage: 3.02 V
Brady Statistic AP VP Percent: 0.03 %
Brady Statistic AP VS Percent: 85.13 %
Brady Statistic AS VP Percent: 0.01 %
Brady Statistic AS VS Percent: 14.83 %
Brady Statistic RA Percent Paced: 84.68 %
Brady Statistic RV Percent Paced: 0.04 %
Date Time Interrogation Session: 20220301012401
HighPow Impedance: 49 Ohm
HighPow Impedance: 70 Ohm
Implantable Lead Implant Date: 20120906
Implantable Lead Implant Date: 20120906
Implantable Lead Location: 753859
Implantable Lead Location: 753860
Implantable Lead Model: 185
Implantable Lead Model: 5076
Implantable Lead Serial Number: 358872
Implantable Pulse Generator Implant Date: 20200831
Lead Channel Impedance Value: 380 Ohm
Lead Channel Impedance Value: 380 Ohm
Lead Channel Impedance Value: 380 Ohm
Lead Channel Pacing Threshold Amplitude: 0.5 V
Lead Channel Pacing Threshold Amplitude: 0.875 V
Lead Channel Pacing Threshold Pulse Width: 0.4 ms
Lead Channel Pacing Threshold Pulse Width: 0.4 ms
Lead Channel Sensing Intrinsic Amplitude: 2.125 mV
Lead Channel Sensing Intrinsic Amplitude: 2.125 mV
Lead Channel Sensing Intrinsic Amplitude: 5.125 mV
Lead Channel Sensing Intrinsic Amplitude: 5.125 mV
Lead Channel Setting Pacing Amplitude: 1.5 V
Lead Channel Setting Pacing Amplitude: 2.5 V
Lead Channel Setting Pacing Pulse Width: 0.4 ms
Lead Channel Setting Sensing Sensitivity: 0.3 mV

## 2020-08-26 ENCOUNTER — Other Ambulatory Visit: Payer: Self-pay

## 2020-08-31 ENCOUNTER — Encounter: Payer: Self-pay | Admitting: Cardiovascular Disease

## 2020-08-31 ENCOUNTER — Other Ambulatory Visit: Payer: Self-pay | Admitting: Cardiovascular Disease

## 2020-09-01 ENCOUNTER — Ambulatory Visit (INDEPENDENT_AMBULATORY_CARE_PROVIDER_SITE_OTHER): Payer: 59 | Admitting: Pharmacist Clinician (PhC)/ Clinical Pharmacy Specialist

## 2020-09-01 ENCOUNTER — Other Ambulatory Visit: Payer: Self-pay

## 2020-09-01 ENCOUNTER — Ambulatory Visit (INDEPENDENT_AMBULATORY_CARE_PROVIDER_SITE_OTHER): Payer: 59

## 2020-09-01 DIAGNOSIS — I5042 Chronic combined systolic (congestive) and diastolic (congestive) heart failure: Secondary | ICD-10-CM

## 2020-09-01 DIAGNOSIS — I513 Intracardiac thrombosis, not elsewhere classified: Secondary | ICD-10-CM

## 2020-09-01 DIAGNOSIS — Z7901 Long term (current) use of anticoagulants: Secondary | ICD-10-CM | POA: Diagnosis not present

## 2020-09-01 LAB — BASIC METABOLIC PANEL
BUN/Creatinine Ratio: 14 (ref 10–24)
BUN: 20 mg/dL (ref 8–27)
CO2: 21 mmol/L (ref 20–29)
Calcium: 8.9 mg/dL (ref 8.6–10.2)
Chloride: 104 mmol/L (ref 96–106)
Creatinine, Ser: 1.45 mg/dL — ABNORMAL HIGH (ref 0.76–1.27)
Glucose: 103 mg/dL — ABNORMAL HIGH (ref 65–99)
Potassium: 4.6 mmol/L (ref 3.5–5.2)
Sodium: 138 mmol/L (ref 134–144)
eGFR: 53 mL/min/{1.73_m2} — ABNORMAL LOW (ref >59)

## 2020-09-01 LAB — POCT INR: INR: 2.5 (ref 2.0–3.0)

## 2020-09-01 LAB — BRAIN NATRIURETIC PEPTIDE: BNP: 301.3 pg/mL — ABNORMAL HIGH (ref 0.0–100.0)

## 2020-09-01 NOTE — Patient Instructions (Signed)
Continue taking 1 tablet daily except 1/2 tablet each Wednesdays  Repeat INR in 6 weeks

## 2020-09-01 NOTE — Assessment & Plan Note (Signed)
Patient with HFrEF, most recent echo showing EF at 20-25% (12/2019)  Unable to increase any GDMT medications for HF due to soft BP.   Patient should continue with current medications.  He is scheduled to see Dr. Claiborne Billings next month, at which time medication titration can be evaluated again.

## 2020-09-01 NOTE — Progress Notes (Signed)
09/01/2020 Steven [name] 01-13-1956 [id number]   HPI:  Steven [name] is a 65 y.o. male patient of Dr Claiborne Billings, with a PMH below who presents today for heart failure medication titration.  He was seen by Dr. Claiborne Billings in January at which time he had to decrease spironolactone from 12.5 mg bid to just once daily, due to hypotension.  Patient returns today to check BP and determine if any further adjustments to medications is needed.    Patient reports that he ran out of [facility] yesterday, is waiting for new bottle of med to arrive.  No complaints of chest pain, shortness of breath, lower extremity edema or dizziness/lightheadedness.  He has not checked his blood pressure out of office since his visit with Dr. Claiborne Billings.  BP sitting and standing were not significantly different.    Past Medical History: CHF EF 20-25% by echo 7/21, ICD  Apical thrombus On warfarin  CAD Anterior wall MI 2012, amlodipine, isosorbide for angina  hyperlipidemia States PCP recently increased atorvastatin to 80 mg daily     Blood Pressure Goal:  130/80  Current Medications: carvedilol 25 mg bid, Farxiga 10 mg qd, Entresto 24/26 mg bid, spironolactone 12.5 mg qd, furosemide 20 mg qd  Amlodipine and isosorbide for anginal pain  Diet: mostly home cooked meals, little salt, but none at table; avoids fried foods; mostlly chicken and [place]; vegetables are mostly fresh, occasionally frozen; avoids salty snacks  Exercise: walks occasionally  Home BP readings:  Home cuff, unsure of accuracy  Intolerances: nkda  Labs: 10/21:  Na 134, K 4.5, Glu 79, BUN 23, SCr 1.49, GFR 56   Wt Readings from Last 3 Encounters:  09/01/20 187 lb (84.8 kg)  07/21/20 184 lb 9.6 oz (83.7 kg)  05/17/20 177 lb 12.8 oz (80.6 kg)   BP Readings from Last 3 Encounters:  09/01/20 106/80  07/21/20 94/63  05/17/20 122/68   Pulse Readings from Last 3 Encounters:  09/01/20 70  07/21/20 61  05/17/20 76    Current Outpatient Medications   Medication Sig Dispense Refill  . albuterol (PROVENTIL HFA;VENTOLIN HFA) 108 (90 Base) MCG/ACT inhaler Inhale 1-2 puffs into the lungs every 6 (six) hours as needed for wheezing or shortness of breath.    [name] albuterol (PROVENTIL) (2.5 MG/3ML) 0.083% nebulizer solution Take 2.5 mg by nebulization every 6 (six) hours as needed for wheezing or shortness of breath.    [name] amLODipine (NORVASC) 2.5 MG tablet Take 1 tablet (2.5 mg total) by mouth daily. 30 tablet 11  . atorvastatin (LIPITOR) 40 MG tablet Take 1 tablet by mouth once daily (Patient taking differently: Take 40 mg by mouth daily.) 90 tablet 1  . carvedilol (COREG) 25 MG tablet TAKE 1 TABLET BY MOUTH TWICE DAILY WITH A MEAL 180 tablet 2  . Cholecalciferol (VITAMIN D) 125 MCG (5000 UT) CAPS Take 5,000 Units by mouth daily.    . dapagliflozin propanediol (FARXIGA) 10 MG TABS tablet Take 10 mg by mouth daily.    [name] ENTRESTO 24-26 MG Take 1 tablet by mouth twice daily 60 tablet 0  . fluticasone (FLONASE) 50 MCG/ACT nasal spray Place 2 sprays into both nostrils daily.    . furosemide (LASIX) 20 MG tablet Take 1 tablet (20 mg total) by mouth daily. 30 tablet 6  . montelukast (SINGULAIR) 10 MG tablet Take 10 mg by mouth daily.    . Multiple Vitamin (MULTIVITAMIN) tablet Take 1 tablet by mouth daily.    . nitroGLYCERIN (  NITROSTAT) 0.4 MG SL tablet DISSOLVE ONE TABLET UNDER THE TONGUE EVERY 5 MINUTES AS NEEDED FOR CHEST PAIN.  DO NOT EXCEED A TOTAL OF 3 DOSES IN 15 MINUTES 75 tablet 3  . spironolactone (ALDACTONE) 25 MG tablet Take 1 tablet (25 mg total) by mouth daily. 30 tablet 6  . tamsulosin (FLOMAX) 0.4 MG CAPS capsule Take 0.4 mg by mouth at bedtime.    . Tiotropium Bromide-Olodaterol (STIOLTO RESPIMAT) 2.5-2.5 MCG/ACT AERS Inhale 2 puffs into the lungs daily.    [name] warfarin (COUMADIN) 5 MG tablet Take 1 to 2 tablets daily as directed by the coumadin clinic 90 tablet 0  . isosorbide mononitrate (IMDUR) 120 MG 24 hr tablet Take 1 tablet (120 mg  total) by mouth daily. 90 tablet 3   No current facility-administered medications for this visit.    No Known Allergies  Past Medical History:  Diagnosis Date  . Acute MI, anterior wall ([place])   . CAD (coronary artery disease)    2D ECHO, 07/13/2011 - EF <25%, LV moderatelty dilated, LA moderately dilatedLEXISCAN, 12/14/2011 - moderate-severe perfusion defect seen in the basal anteroseptal, mid anterior, apicacl anterior, apical, apical inferior, and apical lateral regions, post-stress EF 25%, new EKG changes from baseline abnormalities  . Inguinal hernia, left     Blood pressure 106/80, pulse 70, resp. rate 16, height 5\' 10"  (1.778 m), weight 187 lb (84.8 kg), SpO2 99 %.  Chronic combined systolic and diastolic heart failure ([place]) Patient with HFrEF, most recent echo showing EF at 20-25% (12/2019)  Unable to increase any GDMT medications for HF due to soft BP.   Patient should continue with current medications.  He is scheduled to see Dr. Claiborne Billings next month, at which time medication titration can be evaluated again.     Tommy Medal PharmD CPP [facility] [address] [place], [place] [address] [phone]

## 2020-09-01 NOTE — Progress Notes (Signed)
Remote ICD transmission.   

## 2020-09-01 NOTE — Patient Instructions (Addendum)
  Check your blood pressure at home 3-4 times each week and keep record of the readings.  Take your BP meds as follows:  Continue with all current medications  Bring all of your meds, your BP cuff and your record of home blood pressures to your next appointment.  Exercise as you're able, try to walk approximately 30 minutes per day.  Keep salt intake to a minimum, especially watch canned and prepared boxed foods.  Eat more fresh fruits and vegetables and fewer canned items.  Avoid eating in fast food restaurants.    HOW TO TAKE YOUR BLOOD PRESSURE: . Rest 5 minutes before taking your blood pressure. .  Don't smoke or drink caffeinated beverages for at least 30 minutes before. . Take your blood pressure before (not after) you eat. . Sit comfortably with your back supported and both feet on the floor (don't cross your legs). . Elevate your arm to heart level on a table or a desk. . Use the proper sized cuff. It should fit smoothly and snugly around your bare upper arm. There should be enough room to slip a fingertip under the cuff. The bottom edge of the cuff should be 1 inch above the crease of the elbow. . Ideally, take 3 measurements at one sitting and record the average.

## 2020-09-13 ENCOUNTER — Other Ambulatory Visit: Payer: Self-pay

## 2020-09-13 MED ORDER — SPIRONOLACTONE 25 MG PO TABS: 25.0000 mg | ORAL_TABLET | Freq: Every day | ORAL | 6 refills | Status: DC

## 2020-09-13 MED ORDER — ENTRESTO 24-26 MG PO TABS: 1.0000 | ORAL_TABLET | Freq: Two times a day (BID) | ORAL | 3 refills | Status: DC

## 2020-09-13 MED ORDER — ISOSORBIDE MONONITRATE ER 120 MG PO TB24: 120.0000 mg | ORAL_TABLET | Freq: Every day | ORAL | 3 refills | Status: DC

## 2020-09-13 MED ORDER — AMLODIPINE BESYLATE 2.5 MG PO TABS: 2.5000 mg | ORAL_TABLET | Freq: Every day | ORAL | 3 refills | Status: DC

## 2020-09-29 ENCOUNTER — Ambulatory Visit: Payer: 59

## 2020-10-11 ENCOUNTER — Ambulatory Visit (INDEPENDENT_AMBULATORY_CARE_PROVIDER_SITE_OTHER): Payer: 59

## 2020-10-11 ENCOUNTER — Other Ambulatory Visit: Payer: Self-pay

## 2020-10-11 ENCOUNTER — Ambulatory Visit: Payer: 59 | Admitting: Cardiovascular Disease

## 2020-10-11 DIAGNOSIS — Z7901 Long term (current) use of anticoagulants: Secondary | ICD-10-CM

## 2020-10-11 DIAGNOSIS — I513 Intracardiac thrombosis, not elsewhere classified: Secondary | ICD-10-CM | POA: Diagnosis not present

## 2020-10-11 LAB — POCT INR: INR: 4.3 — AB (ref 2.0–3.0)

## 2020-10-11 NOTE — Patient Instructions (Signed)
Hold tomorrow only and then Continue taking 1 tablet daily except 1/2 tablet each Wednesdays  Repeat INR in 4 weeks

## 2020-11-08 ENCOUNTER — Ambulatory Visit (INDEPENDENT_AMBULATORY_CARE_PROVIDER_SITE_OTHER): Payer: 59

## 2020-11-08 DIAGNOSIS — I513 Intracardiac thrombosis, not elsewhere classified: Secondary | ICD-10-CM

## 2020-11-08 DIAGNOSIS — Z7901 Long term (current) use of anticoagulants: Secondary | ICD-10-CM | POA: Diagnosis not present

## 2020-11-08 LAB — POCT INR: INR: 3.9 — AB (ref 2.0–3.0)

## 2020-11-08 NOTE — Patient Instructions (Signed)
Hold tomorrow only and then decrease to 1 tablet daily except 1/2 tablet each Monday, Wednesday and Friday  Repeat INR in 7 weeks

## 2020-11-23 ENCOUNTER — Ambulatory Visit (INDEPENDENT_AMBULATORY_CARE_PROVIDER_SITE_OTHER): Payer: Medicaid Other

## 2020-11-23 DIAGNOSIS — I255 Ischemic cardiomyopathy: Secondary | ICD-10-CM | POA: Diagnosis not present

## 2020-11-24 LAB — CUP PACEART REMOTE DEVICE CHECK
Battery Remaining Longevity: 101 mo
Battery Voltage: 3.01 V
Brady Statistic AP VP Percent: 0.03 %
Brady Statistic AP VS Percent: 84.84 %
Brady Statistic AS VP Percent: 0.01 %
Brady Statistic AS VS Percent: 15.11 %
Brady Statistic RA Percent Paced: 84.47 %
Brady Statistic RV Percent Paced: 0.04 %
Date Time Interrogation Session: 20220601031706
HighPow Impedance: 43 Ohm
HighPow Impedance: 61 Ohm
Implantable Lead Implant Date: 20120906
Implantable Lead Implant Date: 20120906
Implantable Lead Location: 753859
Implantable Lead Location: 753860
Implantable Lead Model: 185
Implantable Lead Model: 5076
Implantable Lead Serial Number: 358872
Implantable Pulse Generator Implant Date: 20200831
Lead Channel Impedance Value: 342 Ohm
Lead Channel Impedance Value: 342 Ohm
Lead Channel Impedance Value: 380 Ohm
Lead Channel Pacing Threshold Amplitude: 0.5 V
Lead Channel Pacing Threshold Amplitude: 0.75 V
Lead Channel Pacing Threshold Pulse Width: 0.4 ms
Lead Channel Pacing Threshold Pulse Width: 0.4 ms
Lead Channel Sensing Intrinsic Amplitude: 2.375 mV
Lead Channel Sensing Intrinsic Amplitude: 2.375 mV
Lead Channel Sensing Intrinsic Amplitude: 4.875 mV
Lead Channel Sensing Intrinsic Amplitude: 4.875 mV
Lead Channel Setting Pacing Amplitude: 1.5 V
Lead Channel Setting Pacing Amplitude: 2.5 V
Lead Channel Setting Pacing Pulse Width: 0.4 ms
Lead Channel Setting Sensing Sensitivity: 0.3 mV

## 2020-12-08 ENCOUNTER — Encounter: Payer: Self-pay | Admitting: Physician Assistant

## 2020-12-14 NOTE — Progress Notes (Signed)
Remote ICD transmission.   

## 2020-12-28 ENCOUNTER — Ambulatory Visit (INDEPENDENT_AMBULATORY_CARE_PROVIDER_SITE_OTHER): Payer: 59 | Admitting: Cardiovascular Disease

## 2020-12-28 ENCOUNTER — Encounter: Payer: Self-pay | Admitting: Cardiovascular Disease

## 2020-12-28 ENCOUNTER — Other Ambulatory Visit: Payer: Self-pay

## 2020-12-28 ENCOUNTER — Ambulatory Visit (INDEPENDENT_AMBULATORY_CARE_PROVIDER_SITE_OTHER): Payer: 59 | Admitting: Pharmacist Clinician (PhC)/ Clinical Pharmacy Specialist

## 2020-12-28 VITALS — BP 140/72 | HR 68 | Ht 70.0 in | Wt 177.4 lb

## 2020-12-28 DIAGNOSIS — I5042 Chronic combined systolic (congestive) and diastolic (congestive) heart failure: Secondary | ICD-10-CM

## 2020-12-28 DIAGNOSIS — I255 Ischemic cardiomyopathy: Secondary | ICD-10-CM

## 2020-12-28 DIAGNOSIS — I513 Intracardiac thrombosis, not elsewhere classified: Secondary | ICD-10-CM

## 2020-12-28 DIAGNOSIS — I25118 Atherosclerotic heart disease of native coronary artery with other forms of angina pectoris: Secondary | ICD-10-CM | POA: Diagnosis not present

## 2020-12-28 DIAGNOSIS — Z7901 Long term (current) use of anticoagulants: Secondary | ICD-10-CM

## 2020-12-28 DIAGNOSIS — I472 Ventricular tachycardia, unspecified: Secondary | ICD-10-CM

## 2020-12-28 DIAGNOSIS — Z95810 Presence of automatic (implantable) cardiac defibrillator: Secondary | ICD-10-CM

## 2020-12-28 DIAGNOSIS — E785 Hyperlipidemia, unspecified: Secondary | ICD-10-CM

## 2020-12-28 LAB — POCT INR: INR: 2.6 (ref 2.0–3.0)

## 2020-12-28 NOTE — Patient Instructions (Signed)
Medication Instructions:  No Changes *If you need a refill on your cardiac medications before your next appointment, please call your pharmacy*   Lab Work: No labs  If you have labs (blood work) drawn today and your tests are completely normal, you will receive your results only by: [facility] (if you have MyChart) OR A paper copy in the mail If you have any lab test that is abnormal or we need to change your treatment, we will call you to review the results.   Testing/Procedures: No Testing   Follow-Up: At [facility], you and your health needs are our priority.  As part of our continuing mission to provide you with exceptional heart care, we have created designated Provider Care Teams.  These Care Teams include your primary Cardiologist (physician) and Advanced Practice Providers (APPs -  Physician Assistants and Nurse Practitioners) who all work together to provide you with the care you need, when you need it.  We recommend signing up for the patient portal called "MyChart".  Sign up information is provided on this After Visit Summary.  MyChart is used to connect with patients for Virtual Visits (Telemedicine).  Patients are able to view lab/test results, encounter notes, upcoming appointments, etc.  Non-urgent messages can be sent to your provider as well.   To learn more about what you can do with MyChart, go to [email or web address].    Your next appointment:   6 month(s)  The format for your next appointment:   In Person  Provider:   Shelva Majestic, MD   Jenetta Downer

## 2020-12-28 NOTE — Progress Notes (Signed)
Cardiology Office Note    Date:  12/28/2020   ID:  Steven [name], Steven [name] 22-Jun-1956, MRN [id number]  PCP:  Lorelee Market, MD  Cardiologist:  Shelva Majestic, MD    History of Present Illness:  Timothy P Gangl is a 65 y.o. male with documented severe ischemic cardiomyopathy following an extensive anterior myocardial infarction with subsequent defibrillator for primary prevention.    He presents for 6 month follow-up cardiology evaluation.  Mr Purdy is originally from [place] and suffered a large out of hospital anterior wall myocardial infarction in January 2012. [name]  He presented after a three-hour delay to the hospital and was taken emergently to the cardiac catheterization laboratory where I performed successful emergent intervention with the door to balloon time of only 25 minutes. His LAD was successfully recanalized. CPK increased to 8012 with an MB of 716 and troponin was greater than 100 initially. He subsequently wore a life vest and ultimately after several months without significant improvement of LV function he underwent implantable cardio defibrillator insertion.  Interrogation of his defibrillator in the past had shown episodes of nonsustained VT and his dose of carvedilol has been further titrated.   A followup echo Doppler study on 08/04/2013 showed an ejection fraction of 20-25% with diffuse hypokinesis but there was akinesis of the entire anteroseptal myocardium and apical region. There now appear to be an apparent medium-sized mural apical thrombus which was not noted on his last echo. He denies paresthesias. He denies episodes of chest pain. He denies shortness of breath.At his last office visit in February in light of his moderate sized mural thrombus, Plavix, was discontinued, and he was started on Coumadin therapy.   In August 2015 he tripped and hit his head on a bar recently developed severe headache, transient leg weakness, which evolves into slurred speech.  He underwent  evaluation 1-1/2 weeks later and was found  to have a subdural hematoma.  He underwent a right burr hole subdural hematoma evacuation on 02/05/2014 by [name] at [facility].  Initially aspirin and Coumadin were on hold.  He had a follow-up CT scan and ultimately was given clearance to reinstitute Coumadin anticoagulation in light of his severe cardiomyopathy with an ejection fraction of 15-20% on echo on 03/13/2014 and medium-sized apical mural thrombus which had been documented on a previous echo.  This ultimately has stabilized and he has been back on warfarin anticoagulation.     When I evaluated him in 2016 he denied any chest pain, shortness of breath or recurrent palpitations.  His ICD was  last interrogated one month ago by Dr. [name].  He was noted to have very short nonsustained bursts of nonsustained VT with one episode lasting 8 beats at 212 bpm on 07/09/2014.  His device was adjusted so as it would intervene more quickly and avoid syncope.     I saw him in September 2018 and prior to that evaluation had not seen him since 2016.  Over this time period, he has been without recurrent chest pain.  At times he has had some occasional dizziness and weakness.  He saw Dr. Sallyanne Kuster on August 09 2016.  His device was interrogated and showed 72% atrial pacing and no ventricular pacing.  There was one 8 beat episode of nonsustained VT since his last device check.  His last echo Doppler study was in September 2015.  He was evaluated by Dr. Junious Silk for insertion of a penile implantation.  Preoperative clearance was given  by Dr. Sallyanne Kuster was advised that he hold his warfarin and he will stop his aspirin permanently.  He was  seen at [facility] and was felt to have stable stage III chronic kidney disease.  His blood pressure was controlled and he was euvolemic.    He underwent an echo Doppler study on April 18, 2017.  This continued to show reduced ejection fraction at 20-25%.   He had significant wall motion abnormality with akinesis of the apical anterior septum, inferolateral wall, and the entire apex.  There was grade 1 diastolic dysfunction.  He was seen by Dr. Sallyanne Kuster in February 2019.  At that time, his ICD had normal device function.  He has been undergoing remote downloads every 3 months via [facility].  He has not experienced any episodes of recent angina.  He was found to have rare episodes of nonsustained VT only 3 in the last 12 months.  At times he notes some mild dizziness if he stands up very fast.   I saw him in April 2019.  Since that time, he has had evaluations with Dr. Sallyanne Kuster and was not device dependent but had 80% atrial pacing.  His device had recorded lengthy episodes of NSVT but had not delivered therapies.  On February 24, 2019 he underwent dual-chamber ICD generator change out due to the device reaching ERI.  He saw Dr. Sallyanne Kuster in follow-up in November 2020.  He has had issues with mild exertionally precipitated chest tightness which has been a chronic problem for some time and over several follow-up visits with Dr. Sallyanne Kuster he was started on isosorbide and ultimately titrated to 120 mg daily.   I had not seen him since 2019 but evaluated him in a telemedicine visit on December 04, 2019.  At that time he continued to have chronic class II stable anginal symptomatology precipitated by increasing activity.    During that evaluation I recommend the addition of low-dose amlodipine 2.5 mg and he feels this improved his mild chronic anginal symptomatology.   He is unaware of any recurrent palpitations or defibrillator discharge.  He has continued to be on warfarin anticoagulation.    I  saw him in October 2021 and since his prior evaluation he had undergone a prostate biopsy for which warfarin was held.  He is followed by Dr. Junious Silk.  He underwent a repeat echo Doppler study on January 05, 2020 which continue to show very low EF at 20 to 25% with global hypokinesis.   Estimated RV pressure was normal at 23 mm.  He continued to experience some exertional dyspnea and denied  any chest pain.  During my evaluation, I recommended transition to [facility] and he was instructed to discontinue lisinopril for a minimum of 36 hours prior to initiation.  He has been on Farxiga both for his borderline diabetes mellitus as well as CHF.  He was evaluated by Dr. Sallyanne Kuster on May 17, 2020 and had functional class I-II was euvolemic clinically and by OptiVol measurements.  Historicallyhe has had approximately 1 episode of nonsustained VT every month but had only 4 events in the last 11 months.  When I last saw him on July 21, 2020 he denied any chest pain but had  noticed some mild dizziness.  He was on amlodipine 2.5 mg daily, carvedilol 25 mg twice a day, Farxiga 10 mg, Entresto 24/26 mg twice a day, furosemide 40 mg, isosorbide 120 mg, spironolactone 12.5 mg.  He denied any leg swelling or increasing shortness of  breath.  During that evaluation his blood pressure was low and he was euvolemic.  I suggested he reduce his furosemide to 20 mg and reduce spironolactone from 12.5 mg twice a day down to 12.5 mg daily.  He was evaluated by Joslyn Hy in the office on September 01, 2020.  At that time, his blood pressure remained soft precluding further titration of Entresto.  Presently, he feels well.  He denies any recurrent anginal symptoms.  He denies any dizziness.  He is unaware of palpitations.  He continues to be on amlodipine 2.5 mg daily, carvedilol 25 mg twice a day, Farxiga 10 mg daily, Entresto 24/26 mg twice a day, furosemide 20 mg daily, isosorbide 120 mg daily, and spironolactone 12.5 mg daily.  He is on warfarin for anticoagulation followed in the Coumadin clinic.  He takes Stiolto Respimat and as needed albuterol.  He presents for reevaluation.   Past Medical History:  Diagnosis Date   Acute MI, anterior wall (HCC)    CAD (coronary artery disease)    2D ECHO,  07/13/2011 - EF <25%, LV moderatelty dilated, LA moderately dilatedLEXISCAN, 12/14/2011 - moderate-severe perfusion defect seen in the basal anteroseptal, mid anterior, apicacl anterior, apical, apical inferior, and apical lateral regions, post-stress EF 25%, new EKG changes from baseline abnormalities   Inguinal hernia, left     Past Surgical History:  Procedure Laterality Date   CARDIAC CATHETERIZATION  11/25/2010   Predilation balloon-Apex monorail 2x19m, Cutting balloon-2.25x177m resulting in a reduction of 100% stenosis down to less than 10%   CARDIAC CATHETERIZATION  07/12/2010   LAD stented with a 3.5x2434mare-metal Veriflex stent resulting in a reduction of 100% lesion to 0%   CARDIAC DEFIBRILLATOR PLACEMENT  03/02/2011   Medtronic Protecta XT DR, model #[id number] #[id number]  ICD GENERATOR CHANGEOUT N/A 02/24/2019   Procedure: ICD GENERATOR CHANGEOUT;  Surgeon: CroSanda KleinD;  Location: [facility];  Service: Cardiovascular;  Laterality: N/A;   PACEMAKER PLACEMENT  07/14/2010   Temporary placement of pacemaker, if rhythm issue continues will need a permanent device    Current Medications: Outpatient Medications Prior to Visit  Medication Sig Dispense Refill   albuterol (PROVENTIL HFA;VENTOLIN HFA) 108 (90 Base) MCG/ACT inhaler Inhale 1-2 puffs into the lungs every 6 (six) hours as needed for wheezing or shortness of breath.     albuterol (PROVENTIL) (2.5 MG/3ML) 0.083% nebulizer solution Take 2.5 mg by nebulization every 6 (six) hours as needed for wheezing or shortness of breath.     amLODipine (NORVASC) 2.5 MG tablet Take 2.5 mg by mouth daily. Take 1 Tablet Daily     atorvastatin (LIPITOR) 40 MG tablet Take 40 mg by mouth daily. Take 1 Tablet Daily     carvedilol (COREG) 25 MG tablet TAKE 1 TABLET BY MOUTH TWICE DAILY WITH A MEAL 180 tablet 2   Cholecalciferol (VITAMIN D) 125 MCG (5000 UT) CAPS Take 5,000 Units by mouth daily.     dapagliflozin propanediol  (FARXIGA) 10 MG TABS tablet Take 10 mg by mouth daily.     fluticasone (FLONASE) 50 MCG/ACT nasal spray Place 2 sprays into both nostrils daily.     furosemide (LASIX) 20 MG tablet Take 1 tablet (20 mg total) by mouth daily. 30 tablet 6   montelukast (SINGULAIR) 10 MG tablet Take 10 mg by mouth daily.     Multiple Vitamin (MULTIVITAMIN) tablet Take 1 tablet by mouth daily.     nitroGLYCERIN (NITROSTAT) 0.4 MG SL tablet DISSOLVE ONE  TABLET UNDER THE TONGUE EVERY 5 MINUTES AS NEEDED FOR CHEST PAIN.  DO NOT EXCEED A TOTAL OF 3 DOSES IN 15 MINUTES 75 tablet 3   sacubitril-valsartan (ENTRESTO) 24-26 MG Take 1 tablet by mouth 2 (two) times daily. 90 tablet 3   spironolactone (ALDACTONE) 25 MG tablet Take 12.5 mg by mouth daily. Take 0.5 Tablet by mouth Daily     tamsulosin (FLOMAX) 0.4 MG CAPS capsule Take 0.4 mg by mouth at bedtime.     Tiotropium Bromide-Olodaterol (STIOLTO RESPIMAT) 2.5-2.5 MCG/ACT AERS Inhale 2 puffs into the lungs daily.     warfarin (COUMADIN) 5 MG tablet Take 1 to 2 tablets daily as directed by the coumadin clinic 90 tablet 0   atorvastatin (LIPITOR) 40 MG tablet Take 1 tablet by mouth once daily (Patient taking differently: No sig reported) 90 tablet 1   spironolactone (ALDACTONE) 25 MG tablet Take 1 tablet (25 mg total) by mouth daily. 30 tablet 6   isosorbide mononitrate (IMDUR) 120 MG 24 hr tablet Take 1 tablet (120 mg total) by mouth daily. 90 tablet 3   amLODipine (NORVASC) 2.5 MG tablet Take 1 tablet (2.5 mg total) by mouth daily. 90 tablet 3   No facility-administered medications prior to visit.     Allergies:   Patient has no known allergies.   Social History   Socioeconomic History   Marital status: Married    Spouse name: Not on file   Number of children: Not on file   Years of education: Not on file   Highest education level: Not on file  Occupational History   Not on file  Tobacco Use   Smoking status: Former    Pack years: 0.00    Types: Cigarettes,  Cigars   Smokeless tobacco: Never  Substance and Sexual Activity   Alcohol use: Yes    Alcohol/week: 2.0 - 3.0 standard drinks    Types: 2 - 3 drink(s) per week   Drug use: No   Sexual activity: Not on file  Other Topics Concern   Not on file  Social History Narrative   Not on file   Social Determinants of Health   Financial Resource Strain: Not on file  Food Insecurity: Not on file  Transportation Needs: Not on file  Physical Activity: Not on file  Stress: Not on file  Social Connections: Not on file    Socially he is single, he was born in [place].  He has 1 child.  There is remote tobacco history.    Family History:  The patient's family history is not on file.   ROS General: Negative; No fevers, chills, or night sweats;  HEENT: Negative; No changes in vision or hearing, sinus congestion, difficulty swallowing Pulmonary: No recent wheezing Cardiovascular: See HPI GI: Negative; No nausea, vomiting, diarrhea, or abdominal pain GU: Positive for prostate biopsy, erectile dysfunction; No dysuria, hematuria, or difficulty voiding Musculoskeletal: Negative; no myalgias, joint pain, or weakness Hematologic/Oncology: Negative; no easy bruising, bleeding Endocrine: Negative; no heat/cold intolerance; no diabetes Neuro: Negative; no changes in balance, headaches Skin: Negative; No rashes or skin lesions Psychiatric: Negative; No behavioral problems, depression Sleep: Negative; No snoring, daytime sleepiness, hypersomnolence, bruxism, restless legs, hypnogognic hallucinations, no cataplexy Other comprehensive 14 point system review is negative.   PHYSICAL EXAM:   VS:  BP 140/72   Pulse 68   Ht 5' 10" (1.778 m)   Wt 177 lb 6.4 oz (80.5 kg)   SpO2 97%   BMI 25.45 kg/m  Repeat blood pressure by me was 108/70 supine and 102/62 standing  Wt Readings from Last 3 Encounters:  12/28/20 177 lb 6.4 oz (80.5 kg)  09/01/20 187 lb (84.8 kg)  07/21/20 184 lb 9.6 oz (83.7 kg)      General: Alert, oriented, no distress.  Skin: normal turgor, no rashes, warm and dry HEENT: Normocephalic, atraumatic. Pupils equal round and reactive to light; sclera anicteric; extraocular muscles intact; Nose without nasal septal hypertrophy Mouth/Parynx benign; Mallinpatti scale 3 Neck: No JVD, no carotid bruits; normal carotid upstroke Lungs: clear to ausculatation and percussion; no wheezing or rales Chest wall: without tenderness to palpitation Heart: PMI not displaced, RRR, s1 s2 normal, 1/6 systolic murmur, no diastolic murmur, no rubs, gallops, thrills, or heaves Abdomen: soft, nontender; no hepatosplenomehaly, BS+; abdominal aorta nontender and not dilated by palpation. Back: no CVA tenderness Pulses 2+ Musculoskeletal: full range of motion, normal strength, no joint deformities Extremities: no clubbing cyanosis or edema, Homan's sign negative  Neurologic: grossly nonfocal; Cranial nerves grossly wnl Psychologic: Normal mood and affect   Studies/Labs Reviewed:   EKG:  EKG is ordered today.  ECG (independently read by me):  Atrial paced, PR 216; QS V1-4 c/w prior anterior MI; PVC, QTc 418  July 21, 2020 ECG (independently read by me): Atrial paced, prolonged AV conduction,PR 246 msec , LAHB, Anterolateral ST changes  April 12, 2020 ECG (independently read by me): Atrial paced at 63; old anterolateral infarct,  LAHB, QTc 417 msec  September 27 2019 ECG (independently read by me): Atrially paced rhythm at 72 bpm.  One PVC.  Poor progression consistent with old anterior infarction.  Anterolateral ST-T changes   September 2016 ECG (independently read by me): Atrially paced rhythm at 64 bpm.  No ectopy.  Poor anterior R-wave progression concordant with old anterior MI.   March 2016 ECG (independently read by me): Atrially paced rhythm at 74 bpm.  Increased PR interval at 212 ms.  ST-T changes anterolaterally.   November 2015 ECG (independently read by me): Atrially paced  rhythm.  Old anterior wall MI.  Per Ms. Lee noted ST-T changes laterally   Prior September 2015 ECG (independently read by me): Sinus rhythm at 63 beats per minute.  Old anterior wall myocardial infarction, poor R. progression V1 through V4 and T-wave changes V3 through V6, leads 1, and L.     08/18/2013 ECG (independently read by me):  Atrially paced at 64 beats per minute. QTc interval 427 ms. QRS duration 98 ms. Diffuse anterolateral T-wave changes secondary to his prior MI     Prior ECG of 02/27/2013: Atrial  paced rhythm at 64 beats per minute. Evidence for his prior anterolateral wall myocardial infarction with diffuse T-wave abnormality and precordial Q waves V1 through V5    Recent Labs: BMP Latest Ref Rng & Units 08/31/2020 04/23/2020 02/19/2019  Glucose 65 - 99 mg/dL 103(H) 79 94  BUN 8 - 27 mg/dL _0 Creatinine 0.76 - 1.27 mg/dL 1.45(H) 1.49(H) 1.41(H)  BUN/Creat Ratio 10 - _1 Sodium 134 - 144 mmol/L 138 134 134  Potassium 3.5 - 5.2 mmol/L 4.6 4.5 4.5  Chloride 96 - 106 mmol/L 104 99 100  CO2 20 - 29 mmol/L 21 22 19(L)  Calcium 8.6 - 10.2 mg/dL 8.9 9.5 9.2     Hepatic Function Latest Ref Rng & Units 04/23/2020 08/29/2017 09/03/2014  Total Protein 6.0 - 8.5 g/dL 7.3 7.7 6.8  Albumin 3.8 - 4.8 g/dL  4.3 4.3 4.1  AST 0 - 40 IU/L _0 ALT 0 - 44 IU/L _1 Alk Phosphatase 44 - 121 IU/L 65 55 50  Total Bilirubin 0.0 - 1.2 mg/dL 0.7 0.5 0.5  Bilirubin, Direct 0.0 - 0.3 mg/dL - - -    CBC Latest Ref Rng & Units 04/23/2020 02/19/2019 09/03/2014  WBC 3.4 - 10.8 x10E3/uL 5.5 6.1 6.2  Hemoglobin 13.0 - 17.7 g/dL 15.0 14.6 15.9  Hematocrit 37.5 - 51.0 % 45.0 42.4 46.7  Platelets 150 - 450 x10E3/uL 149(L) 156 165   Lab Results  Component Value Date   MCV 95 04/23/2020   MCV 92 02/19/2019   MCV 93.4 09/03/2014   Lab Results  Component Value Date   TSH 2.280 04/23/2020   Lab Results  Component Value Date   HGBA1C (H) 11/23/2010    5.8 (NOTE)                                                                        According to the ADA Clinical Practice Recommendations for 2011, when HbA1c is used as a screening test:   >=6.5%   Diagnostic of Diabetes Mellitus           (if abnormal result  is confirmed)  5.7-6.4%   Increased risk of developing Diabetes Mellitus  References:Diagnosis and Classification of Diabetes Mellitus,Diabetes YFRT,0211,17(BVAPO 1):S62-S69 and Standards of Medical Care in         Diabetes - 2011,Diabetes Care,2011,34  (Suppl 1):S11-S61.     BNP    Component Value Date/Time   BNP 301.3 (H) 08/31/2020 0846   BNP 124.2 (H) 09/03/2014 0803    ProBNP    Component Value Date/Time   PROBNP 211 04/23/2020 0915   PROBNP 9719.0 (H) 11/23/2010 2006     Lipid Panel     Component Value Date/Time   CHOL 142 04/23/2020 0915   CHOL 161 07/01/2013 0838   TRIG 86 04/23/2020 0915   TRIG 108 07/01/2013 0838   HDL 48 04/23/2020 0915   HDL 43 07/01/2013 0838   CHOLHDL 3.0 04/23/2020 0915   CHOLHDL 3.3 09/03/2014 0803   VLDL 16 09/03/2014 0803   LDLCALC 78 04/23/2020 0915   LDLCALC 96 07/01/2013 0838   LABVLDL 16 04/23/2020 0915     RADIOLOGY: No results found.   Additional studies/ records that were reviewed today include:   ECHO 01/05/2020 IMPRESSIONS   1. Since the last study on 04/18/2017 remains very low at 20-25% with  apical aneurysm and no thrombus on Definity echocontrast images.   2. Left ventricular ejection fraction, by estimation, is 20 to 25%. The  left ventricle has severely decreased function. The left ventricle  demonstrates global hypokinesis. The left ventricular internal cavity size  was moderately dilated. Left  ventricular diastolic parameters are consistent with Grade I diastolic  dysfunction (impaired relaxation).   3. Right ventricular systolic function is normal. The right ventricular  size is normal. There is normal pulmonary artery systolic pressure. The  estimated right ventricular  systolic pressure is 14.1 mmHg.   4. The mitral valve is normal in structure. Mild mitral valve  regurgitation. No evidence of mitral stenosis.   5. The aortic valve  is normal in structure. Aortic valve regurgitation is  not visualized. No aortic stenosis is present.   6. The inferior vena cava is normal in size with greater than 50%  respiratory variability, suggesting right atrial pressure of 3 mmHg.    ASSESSMENT:    1. Chronic combined systolic and diastolic heart failure (HCC)   2. Cardiomyopathy, ischemic   3. Long term current use of anticoagulant therapy   4. Coronary artery disease of native artery of native heart with stable angina pectoris ([place])   5. VT (ventricular tachycardia) ([place])   6. ICD  Medtronic protecta dual-chamber, implanted September 2012 primary prevention: Generator change out August 2020   7. Hyperlipidemia LDL goal <70     PLAN:  1.  CAD: Mr. Goodrich suffered a large anterior wall myocardial infarction with late presentation in January 2012.  He remains without anginal symptomatology on his current anti-ischemic regimen consisting of amlodipine 2.5 mg, isosorbide 120 mg daily daily in addition to carvedilol 25 mg twice a day.  2.  Ischemic cardiomyopathy: He had been previously demonstrated to have an EF of 20 to 25%.  Initially had also developed apical thrombus for which he was on warfarin therapy.  His echo Doppler study from July 2021 continue to show EF of 20 to 25%.  He is now on fairly optimal medical management for his reduced EF but his blood pressure remains low precluding further titration of Entresto.  At present he will continue Entresto 24/26 mg twice daily, carvedilol 25 mg twice a day, spironolactone 12.5 mg daily, furosemide 20 mg daily in addition to [place] 10 mg daily.  He is on isosorbide mononitrate 120 mg.  He is euvolemic on exam today.  Laboratory in January 2022 showed a BNP at 301.  Serum creatinine was 1.45.  3: ICD: He underwent a  generator change out for his dual-chamber ICD in August 2020.  He is continue to be followed by Dr. Sallyanne Kuster oh and undergoes every 25-monthdevice checks.  His previous episodes of nonsustained ventricular tachycardia have reduced.    4.  Warfarin anticoagulation.  Previously he was found to have a moderate size apical mural thrombus; not confirmed on his most recent echo.  With his reduced LV function, warfarin has been continued.  5: Hyperlipidemia.  He continues to be on atorvastatin 40 mg daily.  LDL cholesterol was 78 on April 23, 2020.    6.  History of nonsustained VT documented prior to his generator change.  He is undergoing device checks at 371-monthntervals.  As long as he remains stable, I will see him in 6 months for reevaluation or sooner as needed.  Medication Adjustments/Labs and Tests Ordered: Current medicines are reviewed at length with the patient today.  Concerns regarding medicines are outlined above.  Medication changes, Labs and Tests ordered today are listed in the Patient Instructions below. Patient Instructions  Medication Instructions:  No Changes *If you need a refill on your cardiac medications before your next appointment, please call your pharmacy*   Lab Work: No labs  If you have labs (blood work) drawn today and your tests are completely normal, you will receive your results only by: [place] you have MyChart) OR A paper copy in the mail If you have any lab test that is abnormal or we need to change your treatment, we will call you to review the results.   Testing/Procedures: No Testing   Follow-Up: [facility]  are our priority.  As part of our continuing mission to provide you with exceptional heart care, we have created designated Provider Care Teams.  These Care Teams include your primary Cardiologist (physician) and Advanced Practice Providers (APPs -  Physician Assistants and Nurse Practitioners) who  all work together to provide you with the care you need, when you need it.  We recommend signing up for the patient portal called "MyChart".  Sign up information is provided on this After Visit Summary.  MyChart is used to connect with patients for Virtual Visits (Telemedicine).  Patients are able to view lab/test results, encounter notes, upcoming appointments, etc.  Non-urgent messages can be sent to your provider as well.   To learn more about what you can do with MyChart, go to [email or web address].    Your next appointment:   6 month(s)  The format for your next appointment:   In Person  Provider:   Shelva Majestic, MD   Lisabeth Devoid, Shelva Majestic, MD  12/28/2020 6:54 PM    [facility] [address], [place], [place], [place]  [address] Phone: [phone]

## 2021-01-03 ENCOUNTER — Other Ambulatory Visit: Payer: Self-pay | Admitting: Cardiovascular Disease

## 2021-01-11 ENCOUNTER — Encounter: Payer: Self-pay | Admitting: *Deleted

## 2021-01-11 ENCOUNTER — Telehealth: Payer: 59

## 2021-01-11 ENCOUNTER — Telehealth: Payer: Self-pay

## 2021-01-11 NOTE — Telephone Encounter (Signed)
2nd attempt to call patient for CC screening. No answer.

## 2021-01-11 NOTE — Telephone Encounter (Signed)
Called patient for CC screening. LVM to call office back.

## 2021-01-31 ENCOUNTER — Telehealth (INDEPENDENT_AMBULATORY_CARE_PROVIDER_SITE_OTHER): Payer: Self-pay | Admitting: Gastroenterology

## 2021-01-31 DIAGNOSIS — Z1211 Encounter for screening for malignant neoplasm of colon: Secondary | ICD-10-CM

## 2021-01-31 MED ORDER — PEG 3350-KCL-NA BICARB-NACL 420 G PO SOLR: 4000.0000 mL | Freq: Once | ORAL | 0 refills | Status: AC

## 2021-01-31 NOTE — Progress Notes (Signed)
Gastroenterology Pre-Procedure Review  Request Date: 03/08/21 Requesting Physician: Dr. Allen Norris  PATIENT REVIEW QUESTIONS: The patient responded to the following health history questions as indicated:    1. Are you having any GI issues? no 2. Do you have a personal history of Polyps? no 3. Do you have a family history of Colon Cancer or Polyps? no 4. Diabetes Mellitus? no 5. Joint replacements in the past 12 months?no 6. Major health problems in the past 3 months?no 7. Any artificial heart valves, MVP, or defibrillator?no    MEDICATIONS & ALLERGIES:    Patient reports the following regarding taking any anticoagulation/antiplatelet therapy:   Plavix, Coumadin, Eliquis, Xarelto, Lovenox, Pradaxa, Brilinta, or Effient? yes (Coumadin 5 mg) Aspirin? no  Patient confirms/reports the following medications:  Current Outpatient Medications  Medication Sig Dispense Refill   albuterol (PROVENTIL HFA;VENTOLIN HFA) 108 (90 Base) MCG/ACT inhaler Inhale 1-2 puffs into the lungs every 6 (six) hours as needed for wheezing or shortness of breath.     albuterol (PROVENTIL) (2.5 MG/3ML) 0.083% nebulizer solution Take 2.5 mg by nebulization every 6 (six) hours as needed for wheezing or shortness of breath.     amLODipine (NORVASC) 2.5 MG tablet Take 2.5 mg by mouth daily. Take 1 Tablet Daily     atorvastatin (LIPITOR) 40 MG tablet Take 40 mg by mouth daily. Take 1 Tablet Daily     carvedilol (COREG) 25 MG tablet TAKE 1 TABLET BY MOUTH TWICE DAILY WITH A MEAL 180 tablet 0   Cholecalciferol (VITAMIN D) 125 MCG (5000 UT) CAPS Take 5,000 Units by mouth daily.     dapagliflozin propanediol (FARXIGA) 10 MG TABS tablet Take 10 mg by mouth daily.     fluticasone (FLONASE) 50 MCG/ACT nasal spray Place 2 sprays into both nostrils daily.     furosemide (LASIX) 20 MG tablet Take 1 tablet (20 mg total) by mouth daily. 30 tablet 6   isosorbide mononitrate (IMDUR) 120 MG 24 hr tablet Take 1 tablet (120 mg total) by mouth  daily. 90 tablet 3   montelukast (SINGULAIR) 10 MG tablet Take 10 mg by mouth daily.     Multiple Vitamin (MULTIVITAMIN) tablet Take 1 tablet by mouth daily.     nitroGLYCERIN (NITROSTAT) 0.4 MG SL tablet DISSOLVE ONE TABLET UNDER THE TONGUE EVERY 5 MINUTES AS NEEDED FOR CHEST PAIN.  DO NOT EXCEED A TOTAL OF 3 DOSES IN 15 MINUTES 75 tablet 3   sacubitril-valsartan (ENTRESTO) 24-26 MG Take 1 tablet by mouth 2 (two) times daily. 90 tablet 3   spironolactone (ALDACTONE) 25 MG tablet Take 12.5 mg by mouth daily. Take 0.5 Tablet by mouth Daily     tamsulosin (FLOMAX) 0.4 MG CAPS capsule Take 0.4 mg by mouth at bedtime.     Tiotropium Bromide-Olodaterol (STIOLTO RESPIMAT) 2.5-2.5 MCG/ACT AERS Inhale 2 puffs into the lungs daily.     warfarin (COUMADIN) 5 MG tablet Take 1 to 2 tablets daily as directed by the coumadin clinic 90 tablet 0   No current facility-administered medications for this visit.    Patient confirms/reports the following allergies:  No Known Allergies  No orders of the defined types were placed in this encounter.   AUTHORIZATION INFORMATION Primary Insurance: 1D#: Group #:  Secondary Insurance: 1D#: Group #:  SCHEDULE INFORMATION: Date: 03/08/21 Time: Location: [place]

## 2021-02-08 ENCOUNTER — Other Ambulatory Visit: Payer: Self-pay | Admitting: Urology

## 2021-02-08 DIAGNOSIS — C61 Malignant neoplasm of prostate: Secondary | ICD-10-CM

## 2021-02-09 ENCOUNTER — Ambulatory Visit (INDEPENDENT_AMBULATORY_CARE_PROVIDER_SITE_OTHER): Payer: 59

## 2021-02-09 ENCOUNTER — Other Ambulatory Visit: Payer: Self-pay

## 2021-02-09 DIAGNOSIS — I513 Intracardiac thrombosis, not elsewhere classified: Secondary | ICD-10-CM | POA: Diagnosis not present

## 2021-02-09 DIAGNOSIS — Z7901 Long term (current) use of anticoagulants: Secondary | ICD-10-CM | POA: Diagnosis not present

## 2021-02-09 LAB — POCT INR: INR: 2.3 (ref 2.0–3.0)

## 2021-02-09 NOTE — Patient Instructions (Signed)
Continue with 1 tablet daily except 1/2 tablet each Monday, Wednesday and Friday  Repeat INR in 5 weeks; Colonoscopy 9/13

## 2021-02-16 ENCOUNTER — Other Ambulatory Visit (HOSPITAL_COMMUNITY): Payer: Self-pay | Admitting: Urology

## 2021-02-16 DIAGNOSIS — C61 Malignant neoplasm of prostate: Secondary | ICD-10-CM

## 2021-02-22 ENCOUNTER — Ambulatory Visit (INDEPENDENT_AMBULATORY_CARE_PROVIDER_SITE_OTHER): Payer: Medicaid Other

## 2021-02-22 DIAGNOSIS — I255 Ischemic cardiomyopathy: Secondary | ICD-10-CM | POA: Diagnosis not present

## 2021-02-23 ENCOUNTER — Telehealth: Payer: Self-pay

## 2021-02-23 LAB — CUP PACEART REMOTE DEVICE CHECK
Battery Remaining Longevity: 99 mo
Battery Voltage: 3.02 V
Brady Statistic AP VP Percent: 0.03 %
Brady Statistic AP VS Percent: 79.09 %
Brady Statistic AS VP Percent: 0.01 %
Brady Statistic AS VS Percent: 20.86 %
Brady Statistic RA Percent Paced: 78.68 %
Brady Statistic RV Percent Paced: 0.04 %
Date Time Interrogation Session: 20220831033624
HighPow Impedance: 44 Ohm
HighPow Impedance: 58 Ohm
Implantable Lead Implant Date: 20120906
Implantable Lead Implant Date: 20120906
Implantable Lead Location: 753859
Implantable Lead Location: 753860
Implantable Lead Model: 185
Implantable Lead Model: 5076
Implantable Lead Serial Number: 358872
Implantable Pulse Generator Implant Date: 20200831
Lead Channel Impedance Value: 323 Ohm
Lead Channel Impedance Value: 342 Ohm
Lead Channel Impedance Value: 380 Ohm
Lead Channel Pacing Threshold Amplitude: 0.5 V
Lead Channel Pacing Threshold Amplitude: 0.875 V
Lead Channel Pacing Threshold Pulse Width: 0.4 ms
Lead Channel Pacing Threshold Pulse Width: 0.4 ms
Lead Channel Sensing Intrinsic Amplitude: 1.875 mV
Lead Channel Sensing Intrinsic Amplitude: 1.875 mV
Lead Channel Sensing Intrinsic Amplitude: 4.5 mV
Lead Channel Sensing Intrinsic Amplitude: 4.5 mV
Lead Channel Setting Pacing Amplitude: 1.5 V
Lead Channel Setting Pacing Amplitude: 2.5 V
Lead Channel Setting Pacing Pulse Width: 0.4 ms
Lead Channel Setting Sensing Sensitivity: 0.3 mV

## 2021-02-23 NOTE — Telephone Encounter (Signed)
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 Previously cleared to hold warfarin for 5 days prior to other procedures without a Lovenox bridge.  Procedure: prostate biopsy Date of procedure: TBD  CrCl 26m/min Platelet count 146K  Per office protocol, patient can hold warfarin for 5 days prior to procedure.  He does not require bridging, and should resume warfarin on evening of procedure or as directed by MD.

## 2021-02-23 NOTE — Telephone Encounter (Signed)
   [facility] Pre-operative Risk Assessment    Patient Name: Steven [name]  DOB: 1956/04/29 MRN: [id number]  HEARTCARE STAFF:  - IMPORTANT!!!!!! Under Visit Info/Reason for Call, type in Other and utilize the format Clearance MM/DD/YY or Clearance TBD. Do not use dashes or single digits. - Please review there is not already an duplicate clearance open for this procedure. - If request is for dental extraction, please clarify the # of teeth to be extracted. - If the patient is currently at the dentist's office, call Pre-Op Callback Staff (MA/nurse) to input urgent request.  - If the patient is not currently in the dentist office, please route to the Pre-Op pool.  Request for surgical clearance:  What type of surgery is being performed? Prostate Biopsy   When is this surgery scheduled? TBD  What type of clearance is required (medical clearance vs. Pharmacy clearance to hold med vs. Both)? Pharmacy   Are there any medications that need to be held prior to surgery and how long? Warfarin  Practice name and name of physician performing surgery? [facility] Dr. Junious Silk  What is the office phone number? [phone]   7.   What is the office fax number? [phone]  8.   Anesthesia type (None, local, MAC, general) ? Local   Keaston Pile L Noha Karasik 02/23/2021, 10:49 AM  _________________________________________________________________   (provider comments below)

## 2021-02-23 NOTE — Telephone Encounter (Incomplete Revision)
   [facility] Pre-operative Risk Assessment    Patient Name: Steven [name]  DOB: 1955-06-29 MRN: [id number]  HEARTCARE STAFF:  - IMPORTANT!!!!!! Under Visit Info/Reason for Call, type in Other and utilize the format Clearance MM/DD/YY or Clearance TBD. Do not use dashes or single digits. - Please review there is not already an duplicate clearance open for this procedure. - If request is for dental extraction, please clarify the # of teeth to be extracted. - If the patient is currently at the dentist's office, call Pre-Op Callback Staff (MA/nurse) to input urgent request.  - If the patient is not currently in the dentist office, please route to the Pre-Op pool.  Request for surgical clearance:  What type of surgery is being performed? Prostate Biopsy   When is this surgery scheduled? TBD  What type of clearance is required (medical clearance vs. Pharmacy clearance to hold med vs. Both)? Pharmacy   Are there any medications that need to be held prior to surgery and how long? Warfarin 5 days prior to his prostate Biopsy   Practice name and name of physician performing surgery? [facility] Dr. Junious Silk  What is the office phone number? [phone]   7.   What is the office fax number? [phone]  8.   Anesthesia type (None, local, MAC, general) ? Local   Steven [name] Steven [name] 02/23/2021, 10:49 AM  _________________________________________________________________   (provider comments below)

## 2021-02-24 NOTE — Telephone Encounter (Signed)
    Patient Name: Steven [name]  DOB: 1956/01/10 MRN: [id number]  Primary Cardiologist: Shelva Majestic, MD  Chart reviewed as part of pre-operative protocol coverage. Patient has upcoming prostate biopsy planned and we were asked to give our recommendations for holding Warfarin. Per Pharmacy and office protocol: Patient can hold Warfarin for 5 days prior to procedure. He does not require bridging, and should resume Warfarin on evening of procedure or as directed by MD.  I will route this recommendation to the requesting party via [facility] fax function and remove from pre-op pool.  Please call with questions.  Darreld Mclean, PA-C 02/24/2021, 9:06 AM

## 2021-03-03 ENCOUNTER — Telehealth: Payer: Self-pay

## 2021-03-03 NOTE — Telephone Encounter (Signed)
Pt. Requesting a call between the hours of 12 and 12:30 that is his lunch break. He is concerned about whether or not he should hold his blood thinners for his procedure

## 2021-03-03 NOTE — Telephone Encounter (Signed)
Called patient to inform per cardiology he can stop taking his Warfarin 5 days before procedure. They reached out to him on 02/23/21 to inform him of this. Explained to patient if he has concerns with stopping, he should reach out to their office. LVM

## 2021-03-04 ENCOUNTER — Telehealth: Payer: Self-pay | Admitting: Gastroenterology

## 2021-03-04 NOTE — Telephone Encounter (Signed)
Procedure cancelled per pt request.

## 2021-03-04 NOTE — Telephone Encounter (Signed)
Pt. Is requesting a call back he says he can not be off his warfarin for too long and he has another procedure around the same time. He would like to cancel this procedure. He says he can be reached at that nuber but only after 4 pm or around 12 to 12:30

## 2021-03-07 NOTE — Progress Notes (Signed)
Remote ICD transmission.   

## 2021-03-08 ENCOUNTER — Ambulatory Visit: Admission: RE | Admit: 2021-03-08 | Payer: 59 | Source: Home / Self Care | Admitting: Gastroenterology

## 2021-03-08 ENCOUNTER — Encounter: Admission: RE | Payer: Self-pay | Source: Home / Self Care

## 2021-03-08 SURGERY — COLONOSCOPY WITH PROPOFOL
Anesthesia: General

## 2021-03-15 ENCOUNTER — Other Ambulatory Visit: Payer: Self-pay | Admitting: Family Medicine

## 2021-03-15 ENCOUNTER — Ambulatory Visit
Admission: RE | Admit: 2021-03-15 | Discharge: 2021-03-15 | Disposition: A | Discharge status: Home / Self Care | Payer: 59 | Source: Ambulatory Visit | Attending: Family Medicine | Admitting: Family Medicine

## 2021-03-15 DIAGNOSIS — R140 Abdominal distension (gaseous): Secondary | ICD-10-CM

## 2021-03-16 ENCOUNTER — Ambulatory Visit (INDEPENDENT_AMBULATORY_CARE_PROVIDER_SITE_OTHER): Payer: 59

## 2021-03-16 ENCOUNTER — Other Ambulatory Visit: Payer: Self-pay

## 2021-03-16 DIAGNOSIS — Z7901 Long term (current) use of anticoagulants: Secondary | ICD-10-CM | POA: Diagnosis not present

## 2021-03-16 DIAGNOSIS — I513 Intracardiac thrombosis, not elsewhere classified: Secondary | ICD-10-CM | POA: Diagnosis not present

## 2021-03-16 LAB — POCT INR: INR: 1.4 — AB (ref 2.0–3.0)

## 2021-03-16 NOTE — Patient Instructions (Signed)
Take 1.5 tablets today only and then Continue with 1 tablet daily except 1/2 tablet each Monday, Wednesday and Friday  Repeat INR in 1 week; Held 9/11-9/16 for Biopsy;

## 2021-03-17 ENCOUNTER — Other Ambulatory Visit (HOSPITAL_COMMUNITY): Payer: Self-pay | Admitting: Urology

## 2021-03-17 DIAGNOSIS — C61 Malignant neoplasm of prostate: Secondary | ICD-10-CM

## 2021-03-24 ENCOUNTER — Other Ambulatory Visit: Payer: Self-pay

## 2021-03-24 ENCOUNTER — Ambulatory Visit (INDEPENDENT_AMBULATORY_CARE_PROVIDER_SITE_OTHER): Payer: 59 | Admitting: *Deleted

## 2021-03-24 DIAGNOSIS — Z5181 Encounter for therapeutic drug level monitoring: Secondary | ICD-10-CM | POA: Diagnosis not present

## 2021-03-24 DIAGNOSIS — Z7901 Long term (current) use of anticoagulants: Secondary | ICD-10-CM | POA: Diagnosis not present

## 2021-03-24 DIAGNOSIS — I513 Intracardiac thrombosis, not elsewhere classified: Secondary | ICD-10-CM | POA: Diagnosis not present

## 2021-03-24 LAB — POCT INR: INR: 2.1 (ref 2.0–3.0)

## 2021-03-24 NOTE — Patient Instructions (Signed)
Description    Continue with 1 tablet daily except 1/2 tablet each Monday, Wednesday and Friday. Recheck INR in 4 weeks. [facility] [phone]

## 2021-03-31 ENCOUNTER — Encounter (HOSPITAL_COMMUNITY)
Admission: RE | Admit: 2021-03-31 | Discharge: 2021-03-31 | Disposition: A | Discharge status: Home / Self Care | Payer: 59 | Source: Ambulatory Visit | Attending: Urology | Admitting: Urology

## 2021-03-31 ENCOUNTER — Other Ambulatory Visit: Payer: Self-pay

## 2021-03-31 DIAGNOSIS — J439 Emphysema, unspecified: Secondary | ICD-10-CM | POA: Insufficient documentation

## 2021-03-31 DIAGNOSIS — I700 Atherosclerosis of aorta: Secondary | ICD-10-CM | POA: Insufficient documentation

## 2021-03-31 DIAGNOSIS — C61 Malignant neoplasm of prostate: Secondary | ICD-10-CM | POA: Diagnosis not present

## 2021-03-31 DIAGNOSIS — K4090 Unilateral inguinal hernia, without obstruction or gangrene, not specified as recurrent: Secondary | ICD-10-CM | POA: Diagnosis not present

## 2021-03-31 MED ORDER — PIFLIFOLASTAT F 18 (PYLARIFY) INJECTION
9.0000 | Freq: Once | INTRAVENOUS | Status: AC
  Administered 2021-03-31: 9.08 via INTRAVENOUS

## 2021-04-06 ENCOUNTER — Other Ambulatory Visit: Payer: Self-pay | Admitting: Urology

## 2021-04-06 DIAGNOSIS — R590 Localized enlarged lymph nodes: Secondary | ICD-10-CM

## 2021-04-06 DIAGNOSIS — E041 Nontoxic single thyroid nodule: Secondary | ICD-10-CM

## 2021-04-11 ENCOUNTER — Other Ambulatory Visit: Payer: Self-pay | Admitting: Cardiovascular Disease

## 2021-04-21 ENCOUNTER — Other Ambulatory Visit: Payer: Self-pay

## 2021-04-21 ENCOUNTER — Ambulatory Visit (INDEPENDENT_AMBULATORY_CARE_PROVIDER_SITE_OTHER): Payer: 59

## 2021-04-21 DIAGNOSIS — Z7901 Long term (current) use of anticoagulants: Secondary | ICD-10-CM | POA: Diagnosis not present

## 2021-04-21 DIAGNOSIS — I513 Intracardiac thrombosis, not elsewhere classified: Secondary | ICD-10-CM

## 2021-04-21 LAB — POCT INR: INR: 2.1 (ref 2.0–3.0)

## 2021-04-21 NOTE — Patient Instructions (Signed)
Continue with 1 tablet daily except 1/2 tablet each Monday, Wednesday and Friday. Recheck INR in 6 weeks. [facility] [phone]

## 2021-04-25 ENCOUNTER — Ambulatory Visit
Admission: RE | Admit: 2021-04-25 | Discharge: 2021-04-25 | Disposition: A | Discharge status: Home / Self Care | Payer: 59 | Source: Ambulatory Visit | Attending: Urology | Admitting: Urology

## 2021-04-25 DIAGNOSIS — R590 Localized enlarged lymph nodes: Secondary | ICD-10-CM

## 2021-04-25 DIAGNOSIS — E041 Nontoxic single thyroid nodule: Secondary | ICD-10-CM

## 2021-04-25 MED ORDER — IOPAMIDOL (ISOVUE-300) INJECTION 61%
75.0000 mL | Freq: Once | INTRAVENOUS | Status: AC | PRN
  Administered 2021-04-25: 75 mL via INTRAVENOUS

## 2021-04-29 ENCOUNTER — Other Ambulatory Visit (HOSPITAL_COMMUNITY): Payer: Self-pay | Admitting: Urology

## 2021-04-29 DIAGNOSIS — M9981 Other biomechanical lesions of cervical region: Secondary | ICD-10-CM

## 2021-05-02 ENCOUNTER — Other Ambulatory Visit: Payer: Self-pay | Admitting: Urology

## 2021-05-02 DIAGNOSIS — Q892 Congenital malformations of other endocrine glands: Secondary | ICD-10-CM

## 2021-05-04 ENCOUNTER — Other Ambulatory Visit (HOSPITAL_COMMUNITY): Payer: Self-pay | Admitting: Urology

## 2021-05-04 DIAGNOSIS — Q892 Congenital malformations of other endocrine glands: Secondary | ICD-10-CM

## 2021-05-06 ENCOUNTER — Encounter (HOSPITAL_COMMUNITY)
Admission: RE | Admit: 2021-05-06 | Discharge: 2021-05-06 | Disposition: A | Discharge status: Home / Self Care | Payer: 59 | Source: Ambulatory Visit | Attending: Urology | Admitting: Urology

## 2021-05-06 ENCOUNTER — Other Ambulatory Visit: Payer: Self-pay

## 2021-05-06 ENCOUNTER — Encounter (HOSPITAL_COMMUNITY): Payer: Self-pay

## 2021-05-06 ENCOUNTER — Ambulatory Visit (HOSPITAL_COMMUNITY): Payer: 59

## 2021-05-06 DIAGNOSIS — M9981 Other biomechanical lesions of cervical region: Secondary | ICD-10-CM

## 2021-05-06 MED ORDER — TECHNETIUM TC 99M MEDRONATE IV KIT
20.0000 | PACK | Freq: Once | INTRAVENOUS | Status: AC | PRN
  Administered 2021-05-06: 21.3 via INTRAVENOUS

## 2021-05-13 ENCOUNTER — Other Ambulatory Visit: Payer: Self-pay

## 2021-05-13 ENCOUNTER — Ambulatory Visit (HOSPITAL_COMMUNITY)
Admission: RE | Admit: 2021-05-13 | Discharge: 2021-05-13 | Disposition: A | Discharge status: Home / Self Care | Payer: 59 | Source: Ambulatory Visit | Attending: Urology | Admitting: Urology

## 2021-05-13 DIAGNOSIS — Q892 Congenital malformations of other endocrine glands: Secondary | ICD-10-CM | POA: Diagnosis present

## 2021-05-17 ENCOUNTER — Ambulatory Visit
Admission: RE | Admit: 2021-05-17 | Discharge: 2021-05-17 | Disposition: A | Discharge status: Home / Self Care | Payer: 59 | Source: Ambulatory Visit | Attending: Radiation Oncology | Admitting: Radiation Oncology

## 2021-05-17 ENCOUNTER — Other Ambulatory Visit: Payer: Self-pay | Admitting: Genetic Counselor

## 2021-05-17 ENCOUNTER — Inpatient Hospital Stay: Payer: 59 | Attending: Radiation Oncology

## 2021-05-17 ENCOUNTER — Ambulatory Visit (HOSPITAL_BASED_OUTPATIENT_CLINIC_OR_DEPARTMENT_OTHER): Payer: 59 | Admitting: Genetic Counselor

## 2021-05-17 ENCOUNTER — Other Ambulatory Visit: Payer: Self-pay

## 2021-05-17 ENCOUNTER — Encounter: Payer: Self-pay | Admitting: General Practice

## 2021-05-17 DIAGNOSIS — C61 Malignant neoplasm of prostate: Secondary | ICD-10-CM | POA: Diagnosis not present

## 2021-05-17 DIAGNOSIS — Z1379 Encounter for other screening for genetic and chromosomal anomalies: Secondary | ICD-10-CM

## 2021-05-17 LAB — GENETIC SCREENING ORDER

## 2021-05-17 NOTE — Progress Notes (Signed)
                               Care Plan Summary  Name: Steven [name] DOB: 09/04/1955   Your Medical Team:   Urologist -  Dr. Raynelle Bring, [facility]  Radiation Oncologist - Dr. Tyler Pita, [place]     Recommendations: 1) Radiation  * These recommendations are based on information available as of today's consult.      Recommendations may change depending on the results of further tests or exams.    Next Steps: 1) [name] will be contacting you to set up radiation    When appointments need to be scheduled, you will be contacted by [name] and/or [facility].  Questions?  Please do not hesitate to call [name], BSN, RN at [phone] any questions or concerns.  [name] is your Oncology Nurse Navigator and is available to assist you while you're receiving your medical care at [facility].

## 2021-05-17 NOTE — Progress Notes (Signed)
Radiation Oncology         [phone] ________________________________  Multidisciplinary Prostate Cancer Clinic  Initial Radiation Oncology Consultation  Name: Steven [name] MRN: [id number]  Date: 05/17/2021  DOB: [birth year]  TO:IZTIWPYK, Sabino Gasser, MD  Festus Aloe, MD   REFERRING PHYSICIAN: Festus Aloe, MD  DIAGNOSIS: 65 y.o. 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 Malignant neoplasm of prostate ([place])  C61       HISTORY OF PRESENT ILLNESS::Steven [name] is a 65 y.o. gentleman with a history of acute MI and CAD. He has been followed by Dr. Junious Silk for elevated PSA for a number of years. His PSA was elevated at 7.41 in the spring of 2021. He underwent prostate biopsy on 12/15/19 showing two areas of Gleason 3+4 prostate adenocarcinoma. In light of his heart disease and medical comorbidities, he opted to proceed with active surveillance and his PSA has been monitored closely since that time.  His PSA continued to gradually rise, at 8.5 in 04/2020, 10.10 in 10/2020 and reached 13.6 in 01/2021. The patient had a repeat transrectal ultrasound with 12 biopsies of the prostate on 03/11/21.  The prostate volume measured 67.59 cc.  Out of 12 core biopsies,5 were positive.  The maximum Gleason score was 4+3, and this was seen in the right apex, right mid, and left apex. Additionally, small foci of Gleason 3+3 were seen in the left apex lateral and left mid.  He underwent PSMA PET scan for disease staging on 03/31/21 showing marked increased radiotracer activity in prostate bed but no signs of disease elsewhere. 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  He  had a CT soft tissue neck on 04/25/21 for further evaluation of the thyroid nodule noted on PET scan.  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  There were also sclerotic lesions noted in the cervical spine which were not avid on PET scan.  A bone scan on 05/06/21 to further evaluate the cervical spine lesions showed no abnormal foci of tracer uptake specific for bone metastases.  The patient reviewed the biopsy and imaging results with his urologist and he has kindly been referred today to the multidisciplinary prostate cancer clinic for presentation of pathology and radiology studies in our conference for discussion of potential radiation treatment options and clinical evaluation.   Of note, he proceeded to thyroid US on 05/13/21 showing a normal size and appearance of the thyroid gland with a 2.6 cm cystic structure interior to left lobe of thyroid.  Differential diagnosis includes thoracic duct cyst/lymphocele, foregut duplication cyst, or residual cystic thymic tissue.  PREVIOUS RADIATION THERAPY: No  PAST MEDICAL HISTORY:  has a past medical history of Acute MI, anterior wall ([place]), CAD (coronary artery disease), and Inguinal hernia, left.    PAST SURGICAL HISTORY: Past Surgical History:  Procedure Laterality Date   CARDIAC CATHETERIZATION  11/25/2010   Predilation balloon-Apex monorail 2x63mm, Cutting balloon-2.25x58mm, resulting in a reduction of 100% stenosis down to less than 10%   CARDIAC  CATHETERIZATION  07/12/2010   LAD stented with a 3.5x14mm bare-metal Veriflex stent resulting in a reduction of 100% lesion to 0%   CARDIAC DEFIBRILLATOR PLACEMENT  03/02/2011   Medtronic Protecta XT DR, model #[id number], serial #[id number]   ICD GENERATOR CHANGEOUT N/A 02/24/2019   Procedure: ICD GENERATOR CHANGEOUT;  Surgeon:  Sanda Klein, MD;  Location: [facility];  Service: Cardiovascular;  Laterality: N/A;   PACEMAKER PLACEMENT  07/14/2010   Temporary placement of pacemaker, if rhythm issue continues will need a permanent device    FAMILY HISTORY: family history is not on file.  SOCIAL HISTORY:  reports that he has quit smoking. His smoking use included cigarettes and cigars. He has never used smokeless tobacco. He reports current alcohol use of about 2.0 - 3.0 standard drinks per week. He reports that he does not use drugs.  ALLERGIES: Patient has no known allergies.  MEDICATIONS:  Current Outpatient Medications  Medication Sig Dispense Refill   albuterol (PROVENTIL HFA;VENTOLIN HFA) 108 (90 Base) MCG/ACT inhaler Inhale 1-2 puffs into the lungs every 6 (six) hours as needed for wheezing or shortness of breath.     albuterol (PROVENTIL) (2.5 MG/3ML) 0.083% nebulizer solution Take 2.5 mg by nebulization every 6 (six) hours as needed for wheezing or shortness of breath.     amLODipine (NORVASC) 2.5 MG tablet Take 2.5 mg by mouth daily. Take 1 Tablet Daily     atorvastatin (LIPITOR) 40 MG tablet Take 40 mg by mouth daily. Take 1 Tablet Daily     carvedilol (COREG) 25 MG tablet TAKE 1 TABLET BY MOUTH TWICE DAILY WITH A MEAL 180 tablet 0   Cholecalciferol (VITAMIN D) 125 MCG (5000 UT) CAPS Take 5,000 Units by mouth daily.     dapagliflozin propanediol (FARXIGA) 10 MG TABS tablet Take 10 mg by mouth daily.     ENTRESTO 24-26 MG Take 1 tablet by mouth twice daily 90 tablet 0   fluticasone (FLONASE) 50 MCG/ACT nasal spray Place 2 sprays into both nostrils daily.     furosemide (LASIX) 20 MG tablet Take 1 tablet (20 mg total) by mouth daily. 30 tablet 6   isosorbide mononitrate (IMDUR) 120 MG 24 hr tablet Take 1 tablet (120 mg total) by mouth daily. 90 tablet 3   montelukast (SINGULAIR) 10 MG tablet Take 10 mg by mouth daily.     Multiple Vitamin (MULTIVITAMIN) tablet Take 1 tablet by mouth daily.      nitroGLYCERIN (NITROSTAT) 0.4 MG SL tablet DISSOLVE ONE TABLET UNDER THE TONGUE EVERY 5 MINUTES AS NEEDED FOR CHEST PAIN.  DO NOT EXCEED A TOTAL OF 3 DOSES IN 15 MINUTES 75 tablet 3   spironolactone (ALDACTONE) 25 MG tablet Take 12.5 mg by mouth daily. Take 0.5 Tablet by mouth Daily     tamsulosin (FLOMAX) 0.4 MG CAPS capsule Take 0.4 mg by mouth at bedtime.     Tiotropium Bromide-Olodaterol (STIOLTO RESPIMAT) 2.5-2.5 MCG/ACT AERS Inhale 2 puffs into the lungs daily.     warfarin (COUMADIN) 5 MG tablet Take 1 to 2 tablets daily as directed by the coumadin clinic 90 tablet 0   No current facility-administered medications for this encounter.    REVIEW OF SYSTEMS:  On review of systems, the patient reports that he is doing well overall. He denies any chest pain, shortness of breath, cough, fevers, chills, night sweats, unintended weight changes. He denies any bowel disturbances, and denies abdominal pain, nausea or vomiting. He denies any new musculoskeletal or joint aches  or pains. His IPSS was 12, indicating mild urinary symptoms. His SHIM was 5, indicating he has severe erectile dysfunction. A complete review of systems is obtained and is otherwise negative.   PHYSICAL EXAM:  Wt Readings from Last 3 Encounters:  12/28/20 177 lb 6.4 oz (80.5 kg)  09/01/20 187 lb (84.8 kg)  07/21/20 184 lb 9.6 oz (83.7 kg)   Temp Readings from Last 3 Encounters:  05/26/19 (!) 97 F (36.1 C)  02/24/19 (!) 97.3 F (36.3 C) (Oral)   BP Readings from Last 3 Encounters:  12/28/20 140/72  09/01/20 106/80  07/21/20 94/63   Pulse Readings from Last 3 Encounters:  12/28/20 68  09/01/20 70  07/21/20 61    /10  In general this is a well appearing African-American male in no acute distress. He's alert and oriented x4 and appropriate throughout the examination. Cardiopulmonary assessment is negative for acute distress and he exhibits normal effort.    KPS = 90  100 - Normal; no complaints; no evidence of  disease. 90   - Able to carry on normal activity; minor signs or symptoms of disease. 80   - Normal activity with effort; some signs or symptoms of disease. 14   - Cares for self; unable to carry on normal activity or to do active work. 60   - Requires occasional assistance, but is able to care for most of his personal needs. 50   - Requires considerable assistance and frequent medical care. 77   - Disabled; requires special care and assistance. 64   - Severely disabled; hospital admission is indicated although death not imminent. 14   - Very sick; hospital admission necessary; active supportive treatment necessary. 10   - Moribund; fatal processes progressing rapidly. 0     - Dead  Karnofsky DA, Abelmann Glacier, Craver LS and Burchenal JH [phone]) The use of the nitrogen mustards in the palliative treatment of carcinoma: with particular reference to bronchogenic carcinoma Cancer 1 [phone]   LABORATORY DATA:  Lab Results  Component Value Date   WBC 5.5 04/23/2020   HGB 15.0 04/23/2020   HCT 45.0 04/23/2020   MCV 95 04/23/2020   PLT 149 (L) 04/23/2020   Lab Results  Component Value Date   NA 138 08/31/2020   K 4.6 08/31/2020   CL 104 08/31/2020   CO2 21 08/31/2020   Lab Results  Component Value Date   ALT 13 04/23/2020   AST 25 04/23/2020   ALKPHOS 65 04/23/2020   BILITOT 0.7 04/23/2020     RADIOGRAPHY: CT SOFT TISSUE NECK W CONTRAST  Result Date: 04/26/2021 CLINICAL DATA:  Enlarged lymph node seen on PET scan EXAM: CT NECK WITH CONTRAST TECHNIQUE: Multidetector CT imaging of the neck was performed using the standard protocol following the bolus administration of intravenous contrast. CONTRAST:  62mL ISOVUE-300 IOPAMIDOL (ISOVUE-300) INJECTION 61% COMPARISON:  Correlation is made with 03/31/2021 PET-CT. FINDINGS: Pharynx and larynx: Normal. No mass or swelling. Salivary glands: No inflammation, mass, or stone. Thyroid: Normal. Inferior to the thyroid, at the left aspect of the  trachea, there is a 2.0 x 2.0 x 2.5 cm hypoenhancing, hypoattenuating structure (series 3, image 138 and series 7, image 68), which does not appear contiguous with the thyroid. This lesion is well defined and has an average density of -32 HU. Lymph nodes: None enlarged or abnormal density. Vascular: Atherosclerotic calcifications.  Otherwise negative. Limited intracranial: Negative. Visualized orbits: Negative. Mastoids and visualized paranasal sinuses: Clear. Skeleton: Multiple sclerotic lesions  in the cervical spine,, most prominently in C5 and C6, which could represent metastatic disease, although these lesions were not avid on the recent prostate specific PET scan. Upper chest: No focal pulmonary opacity or pleural effusion. Left chest cardiac device. Other: None. IMPRESSION: 1. 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 This lesion is not consistent with thyroid or lymph node origin. Consider ultrasound for further evaluation. 2. Sclerotic lesions in the cervical spine, which could represent metastatic disease, although these lesions were not avid on the recent prostate PET scan. Electronically Signed   By: Merilyn Baba M.D.   On: 04/26/2021 15:09   NM Bone Scan Whole Body  Result Date: 05/09/2021 CLINICAL DATA:  Prostate cancer. EXAM: NUCLEAR MEDICINE WHOLE BODY BONE SCAN TECHNIQUE: Whole body anterior and posterior images were obtained approximately 3 hours after intravenous injection of radiopharmaceutical. RADIOPHARMACEUTICALS:  21.3 mCi Technetium-74m MDP IV COMPARISON:  PET-CT 03/31/2021 FINDINGS: No abnormal foci of increased uptake identified to suggest osseous metastatic disease. Asymmetric focus of increased uptake is identified within the right frontal bone which likely corresponds to burr-hole defect as seen on the PET-CT from 03/31/2021.  Photopenic defect within the ventral left chest wall corresponds to patient's known ICD. Normal physiologic tracer activity identified within the kidneys an urinary bladder. IMPRESSION: 1. No abnormal foci of increased radiotracer uptake specific for bone metastases. Electronically Signed   By: Kerby Moors M.D.   On: 05/09/2021 08:16   US THYROID  Result Date: 05/14/2021 CLINICAL DATA:  Incidental on other study. EXAM: THYROID ULTRASOUND TECHNIQUE: Ultrasound examination of the thyroid gland and adjacent soft tissues was performed. COMPARISON:  CT neck October 2022, PET-CT October 2022 FINDINGS: Parenchymal Echotexture: Normal Isthmus: 0.1 cm Right lobe: 5.0 x 1.5 x 1.6 cm Left lobe: 5.1 x 1.3 x 1.5 cm _________________________________________________________ Estimated total number of nodules >/= 1 cm: 0 Number of spongiform nodules >/=  2 cm not described below (TR1): 0 Number of mixed cystic and solid nodules >/= 1.5 cm not described below (TR2): 0 _________________________________________________________ No discrete nodules are seen within the thyroid gland. Inferior to the left lobe of the thyroid gland, there is a well-circumscribed anechoic cystic structure that measures 2.6 x 1.9 x 1.6 cm. IMPRESSION: 1. Normal size and appearance of the thyroid gland. 2. Inferior to the left lobe of the thyroid gland, there is a well-circumscribed cystic structure that measures up to 2.6 cm in size. Although this is an indeterminate finding, its imaging appearance favors a benign cystic lesion. Given its location inferior to the thyroid gland, differential considerations include thoracic duct cyst/lymphocele, foregut duplication cyst, or residual cystic thymic tissue. This would be an unusual location for a thyroglossal duct cyst. Consider sonographic follow-up at 6 and 12 months to ensure stability. Electronically Signed   By: Albin Felling M.D.   On: 05/14/2021 10:21      IMPRESSION/PLAN: 65 y.o. gentleman with  Stage T1c adenocarcinoma of the prostate with a Gleason score of 4+3 and a PSA of 13.6.    We discussed the patient's workup and outlined the nature of prostate cancer in this setting. The patient's T stage, Gleason's score, and PSA put him into the unfavorable intermediate risk group. Accordingly, he is eligible for a variety of potential treatment options including brachytherapy, 5.5 weeks of external radiation, or prostatectomy. We discussed the available  radiation techniques, and focused on the details and logistics of delivery. The patient is not an ideal candidate for brachytherapy with a prostate volume of 68 cc and prominent median lobe with associated obstructive urinary symptoms. We discussed and outlined the risks, benefits, short and long-term effects associated with radiotherapy and compared and contrasted these with prostatectomy. We discussed the role of SpaceOAR gel in reducing the rectal toxicity associated with radiotherapy. He was encouraged to ask questions that were answered to his/their stated satisfaction.  At the end of the conversation, the patient is interested in moving forward with 5.5 weeks of external beam therapy. We will share our discussion with Dr. Junious Silk and make arrangements for fiducial markers and SpaceOAR gel placement, first available, prior to simulation, to reduce rectal toxicity from radiotherapy. The patient appears to have a good understanding of his disease and our treatment recommendations which are of curative intent and is in agreement with the stated plan.  Therefore, we will move forward with treatment planning accordingly, in anticipation of beginning IMRT in the near future.   We personally spent 60 minutes in this encounter including chart review, reviewing radiological studies, meeting face-to-face with the patient, entering orders and completing documentation.    Nicholos Johns, PA-C    Tyler Pita, MD  [facility] Direct Dial: [phone]  Fax: [phone] [email or web address]  Skype  [email or web address]   This document serves as a record of services personally performed by Tyler Pita, MD and Freeman Caldron, PA-C. It was created on their behalf by Wilburn Mylar, a trained medical scribe. The creation of this record is based on the scribe's personal observations and the provider's statements to them. This document has been checked and approved by the attending provider.

## 2021-05-17 NOTE — Progress Notes (Signed)
REFERRING PROVIDER: Raynelle Bring, MD [address] [place], [place] [address]  PRIMARY PROVIDER:  Lorelee Market, MD  PRIMARY REASON FOR VISIT:  1. Malignant neoplasm of prostate ([facility])    HISTORY OF PRESENT ILLNESS:   Mr. Boule, a 65 y.o. male, was seen for a [place] cancer genetics consultation at the request of Dr. Alinda Money due to a personal history of cancer.  Mr. Coye presents to clinic today to discuss the possibility of a hereditary predisposition to cancer, to discuss genetic testing, and to further clarify his future cancer risks, as well as potential cancer risks for family members.   Mr. Hyde was diagnosed with prostate cancer in 2021 at age 65.   CANCER HISTORY:  Oncology History  Malignant neoplasm of prostate ([place])  03/11/2021 Cancer Staging   Staging form: Prostate, AJCC 8th Edition - Clinical stage from 03/11/2021: Stage IIC (cT1c, cN0, cM0, PSA: 13.6, Grade Group: 3) - Signed by Freeman Caldron, PA-C on 05/17/2021 Histopathologic type: Adenocarcinoma, NOS Stage prefix: Initial diagnosis Prostate specific antigen (PSA) range: 10 to 19 Gleason primary pattern: 4 Gleason secondary pattern: 3 Gleason score: 7 Histologic grading system: 5 grade system Number of biopsy cores examined: 12 Number of biopsy cores positive: 5 Location of positive needle core biopsies: Both sides    05/17/2021 Initial Diagnosis   Malignant neoplasm of prostate [facility])     Past Medical History:  Diagnosis Date   Acute MI, anterior wall ([facility])    CAD (coronary artery disease)    2D ECHO, 07/13/2011 - EF <25%, LV moderatelty dilated, LA moderately dilatedLEXISCAN, 12/14/2011 - moderate-severe perfusion defect seen in the basal anteroseptal, mid anterior, apicacl anterior, apical, apical inferior, and apical lateral regions, post-stress EF 25%, new EKG changes from baseline abnormalities   Inguinal hernia, left     Past Surgical History:  Procedure Laterality Date   CARDIAC  CATHETERIZATION  11/25/2010   Predilation balloon-Apex monorail 2x53m, Cutting balloon-2.25x165m resulting in a reduction of 100% stenosis down to less than 10%   CARDIAC CATHETERIZATION  07/12/2010   LAD stented with a 3.5x2420mare-metal Veriflex stent resulting in a reduction of 100% lesion to 0%   CARDIAC DEFIBRILLATOR PLACEMENT  03/02/2011   Medtronic Protecta XT DR, model #[id number] #PS#[id number]  ICD GENERATOR CHANGEOUT N/A 02/24/2019   Procedure: ICD GENERATOR CHANGEOUT;  Surgeon: CroSanda KleinD;  Location: [facility];  Service: Cardiovascular;  Laterality: N/A;   PACEMAKER PLACEMENT  07/14/2010   Temporary placement of pacemaker, if rhythm issue continues will need a permanent device    Social History   Socioeconomic History   Marital status: Married    Spouse name: Not on file   Number of children: Not on file   Years of education: Not on file   Highest education level: Not on file  Occupational History   Not on file  Tobacco Use   Smoking status: Former    Types: Cigarettes, Cigars   Smokeless tobacco: Never  Substance and Sexual Activity   Alcohol use: Yes    Alcohol/week: 2.0 - 3.0 standard drinks    Types: 2 - 3 drink(s) per week   Drug use: No   Sexual activity: Not on file  Other Topics Concern   Not on file  Social History Narrative   Not on file   Social Determinants of Health   Financial Resource Strain: Not on file  Food Insecurity: Not on file  Transportation Needs: Not on file  Physical Activity:  Not on file  Stress: Not on file  Social Connections: Not on file     FAMILY HISTORY:  We obtained a detailed, 4-generation family history.  Significant diagnoses are listed below:  Mr. Broad does not have any information about his family's medical history because they live in [place]. He is unaware of previous family history of genetic testing for hereditary cancer risks.      GENETIC COUNSELING ASSESSMENT: Mr. Poer is a 65 y.o. male  with a personal history of cancer which is somewhat suggestive of a hereditary predisposition to cancer. We, therefore, discussed and recommended the following at today's visit.   DISCUSSION: We discussed that 5 - 10% of cancer is hereditary.  There are many genes such as BRCA1/2 and HOXB13 that can be associated with hereditary prostate cancer syndromes.  We discussed that testing is beneficial for several reasons including knowing how to follow individuals after completing their treatment and understanding if other family members could be at risk for cancer and allowing them to undergo genetic testing.   We reviewed the characteristics, features and inheritance patterns of hereditary cancer syndromes. We also discussed genetic testing, including the appropriate family members to test, the process of testing, insurance coverage and turn-around-time for results. We discussed the implications of a negative, positive, carrier and/or variant of uncertain significant result. After discussion, Mr. Errickson opted to pursue [facility]'s CancerNext Panel (36 genes)+RNAinsight.  The CancerNext gene panel offered by [facility] includes sequencing, rearrangement analysis, and RNA analysis for the following 36 genes:   APC, ATM, AXIN2, BARD1, BMPR1A, BRCA1, BRCA2, BRIP1, CDH1, CDK4, CDKN2A, CHEK2, DICER1, HOXB13, EPCAM, GREM1, MLH1, MSH2, MSH3, MSH6, MUTYH, NBN, NF1, NTHL1, PALB2, PMS2, POLD1, POLE, PTEN, RAD51C, RAD51D, RECQL, SMAD4, SMARCA4, STK11, and TP53.   We discussed with Mr. Dexheimer that the personal and family history does not meet insurance or NCCN criteria for genetic testing and, therefore, is not highly consistent with a familial hereditary cancer syndrome. However, as he does not have any information about his family's medical history, he was interested in pursuing testing. 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  PLAN: After considering the  risks, benefits, and limitations, Mr. Goel provided informed consent to pursue genetic testing and the blood sample was sent to [facility] for analysis of the CancerNext Panel (36 genes). Results should be available within approximately 2-3 weeks' time, at which point they will be disclosed by telephone to Mr. Calk, as will any additional recommendations warranted by these results. Mr. Farruggia will receive a summary of his genetic counseling visit and a copy of his results once available. This information will also be available in Epic.   Mr. Postlewaite questions were answered to his satisfaction today. Our contact information was provided should additional questions or concerns arise. Thank you for the referral and allowing [place] to share in the care of your patient.   Lucille Passy, MS, [facility] Genetic Counselor [email or web address] (P) [phone]  The patient was seen for a total of 15 minutes in face-to-face genetic counseling. The patient was seen alone.  Drs. Alinda Money and Rudyard were available to discuss this case as needed.   _______________________________________________________________________ For Office Staff:  Number of people involved in session: 1 Was an Intern/ student involved with case: no

## 2021-05-17 NOTE — Consult Note (Signed)
[facility]     05/17/2021   --------------------------------------------------------------------------------   Steven [name]  MRN: [id number]  DOB: [birth year], 65 year old Male  SSN: [id number]   PRIMARY CARE:  [facility]  REFERRING:  Georgette Dover, MD  PROVIDER:  Festus Aloe, M.D.  TREATING:  Raynelle Bring, M.D.  LOCATION:  [facility] [phone]     --------------------------------------------------------------------------------   CC/HPI: CC: Prostate Cancer   Physician requesting consult: Dr. Eda Keys  PCP: [facility]  Location of consult: [facility] - Prostate Cancer Multidisciplinary [facility]   Mr. Sartin is a 65 year old gentleman initially from [place] who has a significant history of cardiovascular disease. 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 His resultant EF is about 20-25% due to his ischemic cardiomyopathy. He has an implanted ICD. He is also maintained on chronic anticoagulation with warfarin. His other medical comorbidities include hypertension, diabetes, and hyperlipidemia.   He was noted to have an elevated PSA in the spring of 2021 of 7.41. He underwent a TRUS biopsy by Dr. 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 Considering his significant medical comorbidities, he elected active surveillance. His PSA increased to 13.6 prompting a confirmatory biopsy on 03/11/21 and this indicated upgraded Gleason 4+3=7 adenocarcinoma with 5 out of 12 biopsy cores positive for malignancy. A PSMA PET scan was performed on 03/31/21 and was negative for metastatic disease.   Family history:   Imaging studies: PSMA PET scan (03/31/21) - Negative for metastatic disease.   PMH: He has a history of severe CAD with a history as outlined above with resultant EF of 20-25%, hypertension, hyperlipidemia, and diabetes.  PSH: ICD   TNM  stage: cT1c N0 M0  PSA: 13.6  Gleason score: 4+3=7 (GG 3)  Biopsy (03/11/21): 5/12 cores positive  Left: L lateral apex (5%, 3+3=6), L apex (30%, 4+3=7), L mid (5%, 3+3=6)  Right: R apex (30%, 4+3=7), R mid (10%, 4+3=7)  Prostate volume: 67.6 cc   Nomogram  OC disease: 30%  EPE: 66%  SVI: 17%  LNI: 17%  PFS (5 year, 10 year): 48%, 33%   Urinary function: IPSS is 12.  Erectile function: SHIM score is 5. He does have refractory erectile dysfunction and currently is increasing his dose of intracavernosal injection therapy under the care of Dr. Junious Silk.     ALLERGIES: No Allergies    MEDICATIONS: Tamsulosin Hcl 0.4 mg capsule 1 capsule PO Q HS  Tamsulosin Hcl 0.4 mg capsule 1 capsule PO Q HS  Warfarin Sodium 1 PO Daily  Amlodipine Besylate 2.5 mg tablet  Atorvastatin Calcium  Carvedilol 6.25 MG Oral Tablet Oral  Entresto 24 mg-26 mg tablet  Farxiga  Furosemide 40 MG Oral Tablet Oral  Gabapentin 300 mg capsule  Isosorbide  Nitroglycerin 0.4 MG/SPRAY Translingual Solution Translingual  Prostaglandin E1 100 % powder 1 ml Intracavernosal Daily PRN SUPERTRIMIX: 30 Pap 2 Phent 20 PGE per ml disp 1 ml syringe x 5  Spironolactone 25 MG Oral Tablet Oral     GU PSH: Penile Injection - 2019 Prostate Needle Biopsy - 03/11/2021, 12/15/2019       PSH Notes: Hernia Repair, Implantable Cardioverter-Defibrillator   NON-GU PSH: Hernia Repair - 2013 Surgical Pathology, Gross And Microscopic Examination For Prostate Needle - 03/11/2021, 12/15/2019     GU PMH: Prostate Cancer, I had a long discussion with the patient using the understanding prostate cancer  booklet and his path report as a reference. We discussed his stage, grade and prognosis. We discussed the nature risks and benefits of active surveillance, radical prostatectomy, external beam radiotherapy, and brachytherapy. We discussed the role of androgen deprivation and chemotherapy in prostate cancer. We also discussed other ablative  techniques such as HiFU and cryotherapy as well as whole gland versus focal treatment. We discussed specifically how each treatment might affect bowel, bladder and sexual function. We discussed how each treatment might effect salvage treatments and active surveillance might lead to progression and more difficult treatment in the future. All questions answered. Also disc nature r/b of gold seeds and SpaceOar if needed. He is interested in surgical therapy, but his cardiac risk may be an issue. He's also interested in brachytherapy, although prostate about 68 grams and a median lobe. I will refer to [place] so that he can see Dr. Alinda Money and Dr. Tammi Klippel to discuss. I ordered CT of neck/thyroid. Exam benign but not something I typically examine. - 04/05/2021, Prostate 68 grams. T1c. F/u as planned. , - 03/11/2021, requested PSA levels. Consdier MRI , - 01/14/2021, PSA stable. Consider MRI or bx next year. PSA in 6 mo , - 05/11/2020, I had a long discussion with the patient using the understanding prostate cancer booklet and his path report as a reference. We discussed his stage, grade and prognosis. We discussed the nature risks and benefits of active surveillance, radical prostatectomy, external beam radiotherapy, and brachytherapy. We discussed the role of androgen deprivation and chemotherapy in prostate cancer. We also discussed other ablative techniques such as HiFU and cryotherapy as well as whole gland versus focal treatment. We discussed specifically how each treatment might affect bowel, bladder and sexual function. We discussed how each treatment might effect salvage treatments and active surveillance might lead to progression and more difficult treatment in the future. All questions answered. He favors surveillance. We discussed PCa progression can occur and go from curable to incurable as well as be more difficult to treat in the future. He is concerned about ED and interested in further therapy. , -  02/09/2020 BPH w/LUTS - 01/14/2021, - 05/11/2020, - 2020, - 2019, - 2017, Benign localized prostatic hyperplasia with lower urinary tract symptoms (LUTS), - 2015 ED due to arterial insufficiency, disc nature r/b of pde5i, injection and off label use liswt. - 01/14/2021, DIsc EDex. Also generic trimix. I will send trimix but he wants to wait until next. , - 05/11/2020, cont injections , - 2021, - 2019, - 2018, - 2017, Erectile dysfunction due to arterial insufficiency, - 2017 Urinary Hesitancy - 01/14/2021, - 05/11/2020, - 2017 Elevated PSA - 12/15/2019, discussed rising levels and nature r/b/a to prostate biopsy. Discussed PSA levels > 10. Discussed management of PCa - surgery vs xrt. Given risk of stopping coumdin and of prostate bx, will check PSA one more time in 1 mo and then bx if elevated. , - 2021, - 2020, - 2020 Primary hypogonadism - 2019, - 2017, Hypogonadism, testicular, - 2017 Weak Urinary Stream - 2019      PMH Notes:  2011-10-09 16:00:37 - Note: Acute Myocardial Infarction   NON-GU PMH: Localized enlarged lymph nodes, possibly noted on PET and CT ordered - 04/05/2021 Nontoxic single thyroid nodule - 04/05/2021 Encounter for general adult medical examination without abnormal findings, Encounter for preventive health examination - 2016 Personal history of other diseases of the circulatory system, History of hypertension - 2014 Personal history of other endocrine, nutritional and metabolic disease, History of  hypercholesterolemia - 2014    FAMILY HISTORY: Family Health Status Number - Runs In Family Father Deceased At Age42 ___ - Runs In Family Mother Deceased At Age 28 from diabetic complicati - Runs In Family No pertinent family history - Other   SOCIAL HISTORY: Marital Status: Single Preferred Language: English; Race: Black or African American Current Smoking Status: Patient does not smoke anymore.  Drinks 1 caffeinated drink per day.     Notes: Former smoker, Tobacco use,  Marital History - Single, Currently On Disability, Caffeine Use, Alcohol Use   REVIEW OF SYSTEMS:    GU Review Male:   Patient denies frequent urination, hard to postpone urination, burning/ pain with urination, get up at night to urinate, leakage of urine, stream starts and stops, trouble starting your streams, and have to strain to urinate .  Gastrointestinal (Lower):   Patient denies diarrhea and constipation.  Gastrointestinal (Upper):   Patient denies vomiting and nausea.  Constitutional:   Patient denies fever, night sweats, weight loss, and fatigue.  Skin:   Patient denies skin rash/ lesion and itching.  Eyes:   Patient denies blurred vision and double vision.  Ears/ Nose/ Throat:   Patient denies sore throat and sinus problems.  Hematologic/Lymphatic:   Patient denies swollen glands and easy bruising.  Cardiovascular:   Patient denies leg swelling and chest pains.  Respiratory:   Patient denies cough and shortness of breath.  Endocrine:   Patient denies excessive thirst.  Musculoskeletal:   Patient denies back pain and joint pain.  Neurological:   Patient denies headaches and dizziness.  Psychologic:   Patient denies depression and anxiety.   VITAL SIGNS: None   MULTI-SYSTEM PHYSICAL EXAMINATION:    Constitutional: Well-nourished. No physical deformities. Normally developed. Good grooming.     Complexity of Data:  Lab Test Review:   PSA  Records Review:   Pathology Reports, Previous Patient Records   01/28/21 10/29/20 05/06/20 09/02/19 07/29/19 02/04/19 11/11/18 09/30/18  PSA  Total PSA 13.60 ng/mL 10.10 ng/mL 8.58 ng/mL 7.41 ng/mL 8.90 ng/mL 4.59 ng/mL 4.67 ng/mL 4.87 ng/mL  Free PSA    0.91 ng/mL 0.16 ng/mL 0.94 ng/mL 0.93 ng/mL 1.11 ng/mL  % Free PSA    12 % PSA 2 % PSA 20 % PSA 20 % PSA 23 % PSA    02/04/19 11/11/18 09/30/18 09/12/17 03/27/16 12/25/14 03/12/14 08/11/13  Hormones  Testosterone, Total 507.0 ng/dL 456.8 ng/dL 524.2 ng/dL 539.1 ng/dL 558.8 pg/dL 688  568   678     PROCEDURES: None   ASSESSMENT:      ICD-10 Details  1 GU:   Prostate Cancer - C61    PLAN:           Document Letter(s):  Created for Patient: Clinical Summary         Notes:   1. Unfavorable intermediate risk prostate cancer: I had a detailed discussion with Mr. Craine today. We did discuss his overall medical condition especially related to his significant coronary artery disease and subsequent ischemic cardiomyopathy. Considering his upgraded disease and the concerning trend in his PSA, it was recommended that he strongly consider therapy of curative intent. Considering the increased risk of surgical therapy with his cardiac disease, it was recommended that he strongly consider external beam radiation therapy.   The patient was counseled about the natural history of prostate cancer and the standard treatment options that are available for prostate cancer. It was explained to him how his age and life expectancy, clinical  stage, Gleason score/prognostic grade group, and PSA (and PSA density) affect his prognosis, the decision to proceed with additional staging studies, as well as how that information influences recommended treatment strategies. We discussed the roles for active surveillance, radiation therapy, surgical therapy, androgen deprivation, as well as ablative therapy and other invesitgational options for the treatment of prostate cancer as appropriate to his individual cancer situation. We discussed the risks and benefits of these options with regard to their impact on cancer control and also in terms of potential adverse events, complications, and impact on quality of life particularly related to urinary and sexual function. The patient was encouraged to ask questions throughout the discussion today and all questions were answered to his stated satisfaction. In addition, the patient was provided with and/or directed to appropriate resources and literature for further education  about prostate cancer and treatment options.   He feels that he has referred informed and is interested in proceeding with external beam radiation therapy. I will notify Dr. Junious Silk and he is scheduled to see Dr. Tammi Klippel later this afternoon.   2. Erectile dysfunction: He did have multiple questions about his erectile function. He already has refractory erectile dysfunction I encouraged him to communicate with Dr. Junious Silk to further adjust his Trimix as needed.   CC: Dr. Festus Aloe  Dr. Tyler Pita        Next Appointment:      Next Appointment: 05/25/2021 01:30 PM    Appointment Type: 75 Minute FirmEST    Location: [facility] [phone]    Provider: Jiles Crocker, NP    Reason for Visit: nxt ava FIRM - pt needs late visit      E & M CODES: We spent 42 minutes dedicated to evaluation and management time, including face to face interaction, discussions on coordination of care, documentation, result review, and discussion with others as applicable.

## 2021-05-17 NOTE — Progress Notes (Signed)
[facility] Psychosocial Distress Screening Spiritual Care  Met with Steven [name] in Prostate Multidisciplinary Clinic to introduce Steven [name] team/resources, reviewing distress screen per protocol.  The patient scored a 8 on the Psychosocial Distress Thermometer which indicates severe distress. Also assessed for distress and other psychosocial needs.   ONCBCN DISTRESS SCREENING 05/17/2021  Screening Type Initial Screening  Distress experienced in past week (1-10) 8  Family Problem type Other (comment)  Emotional problem type Nervousness/Anxiety  Spiritual/Religous concerns type Facing my mortality  Referral to support programs Yes   Mr Steven [name] notes that learning bone scan results reduced his distress to a 3. Provided empathic listening, normalization of feelings, and introduction to [facility] support team and programming. Reassured him that [facility], as a representative of [facility], is a standard part of prostate clinic.   Follow up needed: Yes.  Plan to phone Mr Steven [name] after 4pm, when he gets off work, in ca 2 weeks for follow-up support/check-in.   [place], [place], [facility] Pager [phone] Voicemail [phone]

## 2021-05-24 ENCOUNTER — Ambulatory Visit (INDEPENDENT_AMBULATORY_CARE_PROVIDER_SITE_OTHER): Payer: Medicaid Other

## 2021-05-24 DIAGNOSIS — I472 Ventricular tachycardia, unspecified: Secondary | ICD-10-CM | POA: Diagnosis not present

## 2021-05-25 ENCOUNTER — Other Ambulatory Visit: Payer: Self-pay | Admitting: Cardiovascular Disease

## 2021-05-25 LAB — CUP PACEART REMOTE DEVICE CHECK
Battery Remaining Longevity: 96 mo
Battery Voltage: 3.02 V
Brady Statistic AP VP Percent: 0.03 %
Brady Statistic AP VS Percent: 70.67 %
Brady Statistic AS VP Percent: 0.02 %
Brady Statistic AS VS Percent: 29.29 %
Brady Statistic RA Percent Paced: 70.23 %
Brady Statistic RV Percent Paced: 0.04 %
Date Time Interrogation Session: 20221130033626
HighPow Impedance: 43 Ohm
HighPow Impedance: 58 Ohm
Implantable Lead Implant Date: 20120906
Implantable Lead Implant Date: 20120906
Implantable Lead Location: 753859
Implantable Lead Location: 753860
Implantable Lead Model: 185
Implantable Lead Model: 5076
Implantable Lead Serial Number: 358872
Implantable Pulse Generator Implant Date: 20200831
Lead Channel Impedance Value: 323 Ohm
Lead Channel Impedance Value: 342 Ohm
Lead Channel Impedance Value: 380 Ohm
Lead Channel Pacing Threshold Amplitude: 0.5 V
Lead Channel Pacing Threshold Amplitude: 0.625 V
Lead Channel Pacing Threshold Pulse Width: 0.4 ms
Lead Channel Pacing Threshold Pulse Width: 0.4 ms
Lead Channel Sensing Intrinsic Amplitude: 2 mV
Lead Channel Sensing Intrinsic Amplitude: 2 mV
Lead Channel Sensing Intrinsic Amplitude: 4.25 mV
Lead Channel Sensing Intrinsic Amplitude: 4.25 mV
Lead Channel Setting Pacing Amplitude: 1.5 V
Lead Channel Setting Pacing Amplitude: 2.5 V
Lead Channel Setting Pacing Pulse Width: 0.4 ms
Lead Channel Setting Sensing Sensitivity: 0.3 mV

## 2021-05-30 ENCOUNTER — Other Ambulatory Visit: Payer: Self-pay

## 2021-05-30 ENCOUNTER — Encounter: Payer: Self-pay | Admitting: Cardiovascular Disease

## 2021-05-30 ENCOUNTER — Ambulatory Visit (INDEPENDENT_AMBULATORY_CARE_PROVIDER_SITE_OTHER): Payer: 59

## 2021-05-30 ENCOUNTER — Ambulatory Visit (INDEPENDENT_AMBULATORY_CARE_PROVIDER_SITE_OTHER): Payer: 59 | Admitting: Cardiovascular Disease

## 2021-05-30 VITALS — BP 102/64 | HR 72 | Ht 70.0 in | Wt 164.6 lb

## 2021-05-30 DIAGNOSIS — I236 Thrombosis of atrium, auricular appendage, and ventricle as current complications following acute myocardial infarction: Secondary | ICD-10-CM

## 2021-05-30 DIAGNOSIS — Z95810 Presence of automatic (implantable) cardiac defibrillator: Secondary | ICD-10-CM | POA: Diagnosis not present

## 2021-05-30 DIAGNOSIS — Z7901 Long term (current) use of anticoagulants: Secondary | ICD-10-CM | POA: Diagnosis not present

## 2021-05-30 DIAGNOSIS — I472 Ventricular tachycardia, unspecified: Secondary | ICD-10-CM

## 2021-05-30 DIAGNOSIS — E785 Hyperlipidemia, unspecified: Secondary | ICD-10-CM

## 2021-05-30 DIAGNOSIS — I5042 Chronic combined systolic (congestive) and diastolic (congestive) heart failure: Secondary | ICD-10-CM

## 2021-05-30 DIAGNOSIS — I25118 Atherosclerotic heart disease of native coronary artery with other forms of angina pectoris: Secondary | ICD-10-CM | POA: Diagnosis not present

## 2021-05-30 DIAGNOSIS — I513 Intracardiac thrombosis, not elsewhere classified: Secondary | ICD-10-CM

## 2021-05-30 LAB — POCT INR: INR: 2 (ref 2.0–3.0)

## 2021-05-30 NOTE — Progress Notes (Signed)
Patient ID: Steven [name], male   DOB: [birth year], 65 y.o.   MRN: [id number]    Cardiology Office Note    Date:  05/31/2021   ID:  [name], Schwinn 1956-01-16, MRN [id number]  PCP:  Steven Market, MD  Cardiologist:  Steven [name], M.D.; Steven Klein, MD   Chief Complaint  Patient presents with   ICD check    History of Present Illness:  Steven [name] is a 65 y.o. male with severe ischemic cardiomyopathy following an extensive infarction in the LAD artery territory complicated by severe left ventricular systolic dysfunction, left ventricular apical thrombus and ventricular tachycardia. He received a defibrillator for primary prevention in 2012 [employer] [place], generator change 01/2019, leads 2012). He has never received device therapy to date, but his device has recorded fairly lengthy episodes of nonsustained ventricular tachycardia around the time when he suffered syncope and a subdural hematoma that required surgical evacuation.  He had food poisoning in September and lost substantial weight.  Activity level dropped during that time, but he is back to working full-time, has 3 corporate cleaning accounts and his device shows activity level 3-4 hours a day.  He denies exertional angina or dyspnea.  He has not had orthopnea, PND or edema.  His OptiVol is above baseline over the last few weeks, but he has no signs of hypervolemia on physical exam.  He denies palpitations, dizziness or syncope.  He has not had any defibrillator discharges.  Device interrogation shows estimated longevity of the generator of about 8 years (generator change out in 2020).  He has had a handful of episodes of nonsustained VT over the last year, once in January, twice in February, once in May and September.  All of these events are brief 7-11 beats and were asymptomatic.  There has been no atrial fibrillation.  Lead parameters are excellent.  He has 80% atrial pacing with good heart rate histogram distribution and he  does not require ventricular pacing.  Echo performed in July 2021 showed EF 20-25%, dilated left ventricle with apical aneurysm, no evidence of fluid overload, no evidence of left ventricular thrombus.  Past Medical History:  Diagnosis Date   Acute MI, anterior wall (HCC)    CAD (coronary artery disease)    2D ECHO, 07/13/2011 - EF <25%, LV moderatelty dilated, LA moderately dilatedLEXISCAN, 12/14/2011 - moderate-severe perfusion defect seen in the basal anteroseptal, mid anterior, apicacl anterior, apical, apical inferior, and apical lateral regions, post-stress EF 25%, new EKG changes from baseline abnormalities   Inguinal hernia, left     Past Surgical History:  Procedure Laterality Date   CARDIAC CATHETERIZATION  11/25/2010   Predilation balloon-Apex monorail 2x27mm, Cutting balloon-2.25x55mm, resulting in a reduction of 100% stenosis down to less than 10%   CARDIAC CATHETERIZATION  07/12/2010   LAD stented with a 3.5x8mm bare-metal Veriflex stent resulting in a reduction of 100% lesion to 0%   CARDIAC DEFIBRILLATOR PLACEMENT  03/02/2011   Medtronic Protecta XT DR, model #N829FAO, serial #[id number]   ICD GENERATOR CHANGEOUT N/A 02/24/2019   Procedure: ICD GENERATOR CHANGEOUT;  Surgeon: Steven Klein, MD;  Location: [facility];  Service: Cardiovascular;  Laterality: N/A;   PACEMAKER PLACEMENT  07/14/2010   Temporary placement of pacemaker, if rhythm issue continues will need a permanent device    Outpatient Medications Prior to Visit  Medication Sig Dispense Refill   amLODipine (NORVASC) 2.5 MG tablet Take 2.5 mg by mouth daily. Take 1 Tablet Daily  carvedilol (COREG) 25 MG tablet TAKE 1 TABLET BY MOUTH TWICE DAILY WITH A MEAL 180 tablet 0   Cholecalciferol (VITAMIN D) 125 MCG (5000 UT) CAPS Take 5,000 Units by mouth daily.     dapagliflozin propanediol (FARXIGA) 10 MG TABS tablet Take 10 mg by mouth daily.     furosemide (LASIX) 20 MG tablet Take 1 tablet (20 mg total) by  mouth daily. 30 tablet 6   isosorbide mononitrate (IMDUR) 120 MG 24 hr tablet Take 1 tablet (120 mg total) by mouth daily. 90 tablet 3   Multiple Vitamin (MULTIVITAMIN) tablet Take 1 tablet by mouth daily.     sacubitril-valsartan (ENTRESTO) 24-26 MG Take 1 tablet by mouth twice daily 90 tablet 2   spironolactone (ALDACTONE) 25 MG tablet Take 12.5 mg by mouth daily. Take 0.5 Tablet by mouth Daily     tamsulosin (FLOMAX) 0.4 MG CAPS capsule Take 0.4 mg by mouth at bedtime.     warfarin (COUMADIN) 5 MG tablet Take 1 to 2 tablets daily as directed by the coumadin clinic 90 tablet 0   albuterol (PROVENTIL HFA;VENTOLIN HFA) 108 (90 Base) MCG/ACT inhaler Inhale 1-2 puffs into the lungs every 6 (six) hours as needed for wheezing or shortness of breath. (Patient not taking: Reported on 05/17/2021)     albuterol (PROVENTIL) (2.5 MG/3ML) 0.083% nebulizer solution Take 2.5 mg by nebulization every 6 (six) hours as needed for wheezing or shortness of breath. (Patient not taking: Reported on 05/17/2021)     atorvastatin (LIPITOR) 40 MG tablet Take 40 mg by mouth daily. Take 1 Tablet Daily (Patient not taking: Reported on 05/17/2021)     atorvastatin (LIPITOR) 80 MG tablet Take 80 mg by mouth daily.     fluticasone (FLONASE) 50 MCG/ACT nasal spray Place 2 sprays into both nostrils daily. (Patient not taking: Reported on 05/17/2021)     montelukast (SINGULAIR) 10 MG tablet Take 10 mg by mouth daily. (Patient not taking: Reported on 05/17/2021)     nitroGLYCERIN (NITROSTAT) 0.4 MG SL tablet DISSOLVE ONE TABLET UNDER THE TONGUE EVERY 5 MINUTES AS NEEDED FOR CHEST PAIN.  DO NOT EXCEED A TOTAL OF 3 DOSES IN 15 MINUTES 75 tablet 3   Tiotropium Bromide-Olodaterol (STIOLTO RESPIMAT) 2.5-2.5 MCG/ACT AERS Inhale 2 puffs into the lungs daily. (Patient not taking: Reported on 05/17/2021)     No facility-administered medications prior to visit.     Allergies:   Patient has no known allergies.   Social History    Socioeconomic History   Marital status: Married    Spouse name: Not on file   Number of children: Not on file   Years of education: Not on file   Highest education level: Not on file  Occupational History   Not on file  Tobacco Use   Smoking status: Former    Types: Cigarettes, Cigars   Smokeless tobacco: Never  Substance and Sexual Activity   Alcohol use: Yes    Alcohol/week: 2.0 - 3.0 standard drinks    Types: 2 - 3 drink(s) per week   Drug use: No   Sexual activity: Not on file  Other Topics Concern   Not on file  Social History Narrative   Not on file   Social Determinants of Health   Financial Resource Strain: Not on file  Food Insecurity: Not on file  Transportation Needs: Not on file  Physical Activity: Not on file  Stress: Not on file  Social Connections: Not on file  ROS:   Please see the history of present illness.    ROS All other systems reviewed and are negative.   PHYSICAL EXAM:   VS:  BP 102/64 (BP Location: Left Arm, Patient Position: Sitting, Cuff Size: Normal)   Pulse 72   Ht 5\' 10"  (1.778 m)   Wt 164 lb 9.6 oz (74.7 kg)   SpO2 100%   BMI 23.62 kg/m     General: Alert, oriented x3, no distress, healthy ICD site. Head: no evidence of trauma, PERRL, EOMI, no exophtalmos or lid lag, no myxedema, no xanthelasma; normal ears, nose and oropharynx Neck: normal jugular venous pulsations and no hepatojugular reflux; brisk carotid pulses without delay and no carotid bruits Chest: clear to auscultation, no signs of consolidation by percussion or palpation, normal fremitus, symmetrical and full respiratory excursions Cardiovascular: normal position and quality of the apical impulse, regular rhythm, normal first and second heart sounds, no murmurs, rubs or gallops Abdomen: no tenderness or distention, no masses by palpation, no abnormal pulsatility or arterial bruits, normal bowel sounds, no hepatosplenomegaly Extremities: no clubbing, cyanosis or  edema; 2+ radial, ulnar and brachial pulses bilaterally; 2+ right femoral, posterior tibial and dorsalis pedis pulses; 2+ left femoral, posterior tibial and dorsalis pedis pulses; no subclavian or femoral bruits Neurological: grossly nonfocal Psych: Normal mood and affect   Wt Readings from Last 3 Encounters:  05/30/21 164 lb 9.6 oz (74.7 kg)  05/17/21 159 lb 12.8 oz (72.5 kg)  12/28/20 177 lb 6.4 oz (80.5 kg)      Studies/Labs Reviewed:   ECHO 01/05/2020:  1. Since the last study on 04/18/2017 remains very low at 20-25% with  apical aneurysm and no thrombus on Definity echocontrast images.   2. Left ventricular ejection fraction, by estimation, is 20 to 25%. The  left ventricle has severely decreased function. The left ventricle  demonstrates global hypokinesis. The left ventricular internal cavity size  was moderately dilated. Left  ventricular diastolic parameters are consistent with Grade I diastolic  dysfunction (impaired relaxation).   3. Right ventricular systolic function is normal. The right ventricular  size is normal. There is normal pulmonary artery systolic pressure. The  estimated right ventricular systolic pressure is 08.6 mmHg.   4. The mitral valve is normal in structure. Mild mitral valve  regurgitation. No evidence of mitral stenosis.   5. The aortic valve is normal in structure. Aortic valve regurgitation is  not visualized. No aortic stenosis is present.   6. The inferior vena cava is normal in size with greater than 50%  respiratory variability, suggesting right atrial pressure of 3 mmHg.   EKG:  EKG is not ordered today.  Intracardiac electrogram shows atrial paced, ventricular sensed rhythm.  Personally reviewed ECG from 12/28/2020 which shows atrial paced, ventricular sensed rhythm with sequela of old anterolateral infarction.  T wave versions are seen in leads V4-V6, QTC normal at 418 ms.  A single PVC is seen.  Lipid Panel    Component Value Date/Time    CHOL 142 04/23/2020 0915   CHOL 161 07/01/2013 0838   TRIG 86 04/23/2020 0915   TRIG 108 07/01/2013 0838   HDL 48 04/23/2020 0915   HDL 43 07/01/2013 0838   CHOLHDL 3.0 04/23/2020 0915   CHOLHDL 3.3 09/03/2014 0803   VLDL 16 09/03/2014 0803   LDLCALC 78 04/23/2020 0915   LDLCALC 96 07/01/2013 0838    ASSESSMENT:    1. Chronic combined systolic and diastolic heart failure ([place])   2.  Coronary artery disease of native artery of native heart with stable angina pectoris ([place])   3. VT (ventricular tachycardia)   4. Implantable cardioverter-defibrillator (ICD) in situ   5. Left ventricular thrombosis following MI ([place])   6. Long term current use of anticoagulant therapy   7. Hyperlipidemia LDL goal <70       PLAN:  In order of problems listed above:  CHF: Clinically euvolemic, NYHA functional class I.  His thoracic impedance suggest some degree of volume overload, but he does not have orthopnea, PND, lower extremity edema or exertional limiting dyspnea.  He is on appropriate guideline directed medical therapy, but doses of medications are limited by hypotension. CAD: CCS functional class I on 3 antianginal medications (amlodipine, carvedilol, isosorbide mononitrate). VT: Continues to have a similar pattern of occasional nonsustained VT, no more than once every 2 months or so.  In the past, he had symptomatic nonsustained VT that may have led to his fall and head injury, but since then, all the episodes have been very brief. ICD: Normal device function.  Continue remote downloads every 3 months. LV thrombus: Not seen on recent echoes Anticoagulation: Denies bleeding problems.  INR in target range, most recently 2.0 on 05/30/2021 HLP: On statin.  He is due for repeat lipid profile.   Medication Adjustments/Labs and Tests Ordered: Current medicines are reviewed at length with the patient today.  Concerns regarding medicines are outlined above.  Medication changes, Labs and Tests  ordered today are listed in the Patient Instructions below. Patient Instructions  Medication Instructions:  No changes *If you need a refill on your cardiac medications before your next appointment, please call your pharmacy*   Lab Work: None ordered If you have labs (blood work) drawn today and your tests are completely normal, you will receive your results only by: [place] (if you have MyChart) OR A paper copy in the mail If you have any lab test that is abnormal or we need to change your treatment, we will call you to review the results.   Testing/Procedures: None ordered   Follow-Up: At [facility], you and your health needs are our priority.  As part of our continuing mission to provide you with exceptional heart care, we have created designated Provider Care Teams.  These Care Teams include your primary Cardiologist (physician) and Advanced Practice Providers (APPs -  Physician Assistants and Nurse Practitioners) who all work together to provide you with the care you need, when you need it.  We recommend signing up for the patient portal called "MyChart".  Sign up information is provided on this After Visit Summary.  MyChart is used to connect with patients for Virtual Visits (Telemedicine).  Patients are able to view lab/test results, encounter notes, upcoming appointments, etc.  Non-urgent messages can be sent to your provider as well.   To learn more about what you can do with MyChart, go to [email or web address].    Your next appointment:   12 month(s)  The format for your next appointment:   In Person  Provider:   Sanda Klein, MD      Signed, Steven Klein, MD  05/31/2021 11:59 AM    [address], [place], [place]  [address] Phone: [phone]; Fax: [phone]

## 2021-05-30 NOTE — Patient Instructions (Signed)

## 2021-05-30 NOTE — Patient Instructions (Signed)
Continue with 1 tablet daily except 1/2 tablet each Monday, Wednesday and Friday. Recheck INR in 6 weeks. [facility] [phone]

## 2021-05-31 ENCOUNTER — Encounter: Payer: Self-pay | Admitting: Cardiovascular Disease

## 2021-06-01 NOTE — Progress Notes (Signed)
Remote ICD transmission.   

## 2021-06-11 ENCOUNTER — Encounter: Payer: Self-pay | Admitting: Family Medicine

## 2021-06-13 ENCOUNTER — Other Ambulatory Visit: Payer: Self-pay | Admitting: Family Medicine

## 2021-06-13 DIAGNOSIS — R634 Abnormal weight loss: Secondary | ICD-10-CM

## 2021-06-15 ENCOUNTER — Other Ambulatory Visit: Payer: Self-pay | Admitting: Urology

## 2021-06-15 ENCOUNTER — Telehealth: Payer: Self-pay | Admitting: Cardiovascular Disease

## 2021-06-15 NOTE — Telephone Encounter (Signed)
° °  Pre-operative Risk Assessment    Patient Name: Steven [name]  DOB: 1956/01/23 MRN: [id number]      Request for Surgical Clearance    Procedure:   fiducial marker placement, Space Oar  Date of Surgery:  Clearance 07/21/20                                 Surgeon:  Raynelle Bring Surgeon's Group or Practice Name:  [facility] Phone number:  [phone] Fax number:  [phone]   Type of Clearance Requested:   - Medical  -Pharmacy Warfarin should be held 5 days prior    Type of Anesthesia:  General    Additional requests/questions:   n/a  Signed, Kamira J Martinique   06/15/2021, 2:14 PM

## 2021-06-16 NOTE — Telephone Encounter (Signed)
° °  Primary Cardiologist: Shelva Majestic, MD  Chart reviewed as part of pre-operative protocol coverage. Given past medical history and time since last visit, based on ACC/AHA guidelines, Steven [name] would be at acceptable risk for the planned procedure without further cardiovascular testing.   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     Procedure: fiducial marker placement, Space Oar Date of procedure: 07/21/20   CrCl 60 Platelet count 178     Per office protocol, patient can hold warfarin for 5 days prior to procedure.   Patient will not need bridging with Lovenox (enoxaparin) around procedure.    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 Previously cleared to hold warfarin for 5 days prior to other procedures without a Lovenox bridge.  I will route this recommendation to the requesting party via Epic fax function and remove from pre-op pool.  Please call with questions.  Jossie Ng. [name] NP-C    06/16/2021, 1:32 PM [facility] [place] Office [phone] Fax [phone]

## 2021-06-16 NOTE — Telephone Encounter (Signed)
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    Procedure: fiducial marker placement, Space Oar Date of procedure: 07/21/20  CrCl 60 Platelet count 178   Per office protocol, patient can hold warfarin for 5 days prior to procedure.   Patient will not need bridging with Lovenox (enoxaparin) around procedure.   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 Previously cleared to hold warfarin for 5 days prior to other procedures without a Lovenox bridge.

## 2021-06-17 ENCOUNTER — Ambulatory Visit (HOSPITAL_COMMUNITY): Admission: RE | Admit: 2021-06-17 | Payer: 59 | Source: Ambulatory Visit

## 2021-06-17 ENCOUNTER — Other Ambulatory Visit: Payer: Self-pay

## 2021-06-17 ENCOUNTER — Ambulatory Visit (HOSPITAL_COMMUNITY)
Admission: RE | Admit: 2021-06-17 | Discharge: 2021-06-17 | Disposition: A | Discharge status: Home / Self Care | Payer: 59 | Source: Ambulatory Visit | Attending: Family Medicine | Admitting: Family Medicine

## 2021-06-17 DIAGNOSIS — R634 Abnormal weight loss: Secondary | ICD-10-CM | POA: Insufficient documentation

## 2021-06-17 LAB — POCT I-STAT CREATININE: Creatinine, Ser: 1.5 mg/dL — ABNORMAL HIGH (ref 0.61–1.24)

## 2021-06-17 MED ORDER — IOHEXOL 9 MG/ML PO SOLN
ORAL | Status: AC
  Filled 2021-06-17: qty 1000

## 2021-06-17 MED ORDER — IOHEXOL 9 MG/ML PO SOLN: 1000.0000 mL | ORAL | Status: DC

## 2021-06-17 MED ORDER — IOHEXOL 350 MG/ML SOLN
75.0000 mL | Freq: Once | INTRAVENOUS | Status: AC | PRN
  Administered 2021-06-17: 16:00:00 75 mL via INTRAVENOUS

## 2021-06-21 ENCOUNTER — Telehealth: Payer: Self-pay | Admitting: Genetic Counselor

## 2021-06-21 ENCOUNTER — Encounter: Payer: Self-pay | Admitting: Genetic Counselor

## 2021-06-21 DIAGNOSIS — Z7189 Other specified counseling: Secondary | ICD-10-CM | POA: Insufficient documentation

## 2021-06-21 DIAGNOSIS — Z1379 Encounter for other screening for genetic and chromosomal anomalies: Secondary | ICD-10-CM | POA: Insufficient documentation

## 2021-06-21 NOTE — Telephone Encounter (Signed)
I contacted Mr. Stucky to discuss his genetic testing results. No pathogenic variants were identified in the 36 genes analyzed. Detailed clinic note to follow.  The test report has been scanned into EPIC and is located under the Molecular Pathology section of the Results Review tab.  A portion of the result report is included below for reference.   Lucille Passy, MS, [facility] Genetic Counselor [email or web address] (P) [phone]

## 2021-06-23 ENCOUNTER — Ambulatory Visit: Payer: Self-pay | Admitting: Genetic Counselor

## 2021-06-23 DIAGNOSIS — C61 Malignant neoplasm of prostate: Secondary | ICD-10-CM

## 2021-06-23 DIAGNOSIS — Z1379 Encounter for other screening for genetic and chromosomal anomalies: Secondary | ICD-10-CM

## 2021-06-23 NOTE — Progress Notes (Signed)
HPI:   Steven [name] was previously seen in the [facility] clinic due to a personal history of cancer and concerns regarding a hereditary predisposition to cancer. Please refer to our prior cancer genetics clinic note for more information regarding our discussion, assessment and recommendations, at the time. Steven [name] recent genetic test results were disclosed to him, as were recommendations warranted by these results. These results and recommendations are discussed in more detail below.  CANCER HISTORY:  Oncology History  Malignant neoplasm of prostate ([place])  03/11/2021 Cancer Staging   Staging form: Prostate, AJCC 8th Edition - Clinical stage from 03/11/2021: Stage IIC (cT1c, cN0, cM0, PSA: 13.6, Grade Group: 3) - Signed by Freeman Caldron, PA-C on 05/17/2021 Histopathologic type: Adenocarcinoma, NOS Stage prefix: Initial diagnosis Prostate specific antigen (PSA) range: 10 to 19 Gleason primary pattern: 4 Gleason secondary pattern: 3 Gleason score: 7 Histologic grading system: 5 grade system Number of biopsy cores examined: 12 Number of biopsy cores positive: 5 Location of positive needle core biopsies: Both sides    05/17/2021 Initial Diagnosis   Malignant neoplasm of prostate ([place])    Genetic Testing   [facility] CancerNext was Negative. Report date is 06/15/2021.  The CancerNext gene panel offered by [employer] includes sequencing, rearrangement analysis, and RNA analysis for the following 36 genes:   APC, ATM, AXIN2, BARD1, BMPR1A, BRCA1, BRCA2, BRIP1, CDH1, CDK4, CDKN2A, CHEK2, DICER1, HOXB13, EPCAM, GREM1, MLH1, MSH2, MSH3, MSH6, MUTYH, NBN, NF1, NTHL1, PALB2, PMS2, POLD1, POLE, PTEN, RAD51C, RAD51D, RECQL, SMAD4, SMARCA4, STK11, and TP53.      FAMILY HISTORY:  We obtained a detailed, 4-generation family history.  Significant diagnoses are listed below:   Steven [name] does not have any information about his family's medical history because they live in [place]. He is  unaware of previous family history of genetic testing for hereditary cancer risks.         GENETIC TEST RESULTS:  The [employer] CancerNext Panel found no pathogenic mutations.   The CancerNext gene panel offered by [employer] includes sequencing, rearrangement analysis, and RNA analysis for the following 36 genes:   APC, ATM, AXIN2, BARD1, BMPR1A, BRCA1, BRCA2, BRIP1, CDH1, CDK4, CDKN2A, CHEK2, DICER1, HOXB13, EPCAM, GREM1, MLH1, MSH2, MSH3, MSH6, MUTYH, NBN, NF1, NTHL1, PALB2, PMS2, POLD1, POLE, PTEN, RAD51C, RAD51D, RECQL, SMAD4, SMARCA4, STK11, and TP53.     The test report has been scanned into EPIC and is located under the Molecular Pathology section of the Results Review tab.  A portion of the result report is included below for reference. Genetic testing reported out on 06/15/2021.       Even though a pathogenic variant was not identified, possible explanations for his personal history of cancer may include: There may be no hereditary risk for cancer in the family. Steven [name] cancer may be due to other genetic or environmental factors. There may be a gene mutation in one of these genes that current testing methods cannot detect, but that chance is small. There could be another gene that has not yet been discovered, or that we have not yet tested, that is responsible for the cancer diagnoses in the family.   Therefore, it is important to remain in touch with cancer genetics in the future so that we can continue to offer Steven [name] the most up to date genetic testing.   ADDITIONAL GENETIC TESTING:  We discussed with Steven [name] that his genetic testing was fairly extensive.  If there are genes identified to increase  cancer risk that can be analyzed in the future, we would be happy to discuss and coordinate this testing at that time.    CANCER SCREENING RECOMMENDATIONS:  Steven [name] test result is considered negative (normal).  This means that we have not identified a hereditary cause  for his personal history of cancer at this time. Most cancers happen by chance and this negative test suggests that his cancer may fall into this category.    An individual's cancer risk and medical management are not determined by genetic test results alone. Overall cancer risk assessment incorporates additional factors, including personal medical history, family history, and any available genetic information that may result in a personalized plan for cancer prevention and surveillance. Therefore, it is recommended he continue to follow the cancer management and screening guidelines provided by his oncology and primary healthcare provider.  RECOMMENDATIONS FOR FAMILY MEMBERS:   Since he did not inherit a mutation in a cancer predisposition gene included on this panel, his daughter could not have inherited a mutation from him in one of these genes.  FOLLOW-UP:  Cancer genetics is a rapidly advancing field and it is possible that new genetic tests will be appropriate for him and/or his family members in the future. We encouraged him to remain in contact with cancer genetics on an annual basis so we can update his personal and family histories and let him know of advances in cancer genetics that may benefit this family.   Our contact number was provided. Steven [name] questions were answered to his satisfaction, and he knows he is welcome to call us at anytime with additional questions or concerns.   Lucille Passy, MS, [facility] Genetic Counselor [email or web address] (P) [phone]

## 2021-06-27 ENCOUNTER — Ambulatory Visit: Payer: 59 | Admitting: Surgery

## 2021-06-29 ENCOUNTER — Encounter: Payer: Self-pay | Admitting: General Practice

## 2021-06-29 NOTE — Progress Notes (Signed)
[facility] Spiritual Care Note  Left voicemail encouraging return call.   Chaplain Odes Lolli, MDiv, [facility] Pager [phone] Voicemail [phone]  

## 2021-07-05 ENCOUNTER — Encounter: Payer: Self-pay | Admitting: Urology

## 2021-07-05 NOTE — Progress Notes (Addendum)
Patient scheduled for FM and SO 07/21/21 and CT [facility] 07/26/21 in anticipation of beginning his 5.5 week course of prostate IMRT shortly thereafter.  Nicholos Johns, MMS, PA-C [place] [facility] [place]: [phone]   Fax: [phone]

## 2021-07-12 ENCOUNTER — Other Ambulatory Visit: Payer: Self-pay | Admitting: Cardiovascular Disease

## 2021-07-18 NOTE — Progress Notes (Signed)
I called and left a message with scheduler, Marlowe Kays at Dr. Lynne Logan office. I let her know that the patient was not a candidate for surgery at [facility] due to his low EF. Per 01/05/20, LVEF was 20-25% in Epic. Any future surgeries would also need to be done at main.

## 2021-07-19 ENCOUNTER — Encounter: Payer: Self-pay | Admitting: Cardiovascular Disease

## 2021-07-19 NOTE — Progress Notes (Addendum)
COVID swab appointment: n/a  COVID Vaccine Completed: yes x3 Date COVID Vaccine completed: Has received booster: COVID vaccine manufacturer: [place]   Date of COVID positive in last 90 days: no  PCP - Birdena Jubilee Cardiologist - Shelva Majestic, MD Electrophysiologist- Sanda Klein, MD  Cardiac clearance 06/15/21 by Coletta Memos in [place]  Chest x-ray - 03/15/21 Epic EKG - 12/28/20 Epic Stress Test - 2013 ECHO - 2018 Cardiac Cath - 2012 Pacemaker/ICD device last checked: 05/25/21 Epic Spinal Cord Stimulator: n/a  Sleep Study - n/a CPAP -   Fasting Blood Sugar - pre per pt, no check at home Checks Blood Sugar _____ times a day  Blood Thinner Instructions: Warfarin, hold 5 days Aspirin Instructions: Last Dose: 07/17/21  Activity level: Can go up a flight of stairs and perform activities of daily living without stopping and without symptoms of chest pain or shortness of breath    Anesthesia review: cardiomyopathy, VT, CAD, HF, CKD, ICD, MI  Patient denies shortness of breath, fever, cough and chest pain at PAT appointment   Patient verbalized understanding of instructions that were given to them at the PAT appointment. Patient was also instructed that they will need to review over the PAT instructions again at home before surgery.

## 2021-07-19 NOTE — Patient Instructions (Addendum)
DUE TO COVID-19 ONLY ONE VISITOR IS ALLOWED TO COME WITH YOU AND STAY IN THE WAITING ROOM ONLY DURING PRE OP AND PROCEDURE.   **NO VISITORS ARE ALLOWED IN THE SHORT STAY AREA OR RECOVERY ROOM!!**       Your procedure is scheduled on: 07/21/21   Report to [facility] Main Entrance    Report to admitting at 10:45 AM   Call this number if you have problems the morning of surgery [phone]   Follow clear liquid diet day before surgery.   May have liquids until 10:00 AM day of surgery  CLEAR LIQUID DIET  Foods Allowed                                                                     Foods Excluded  Water, Black Coffee and tea (no milk or creamer)            liquids that you cannot  Plain Jell-O in any flavor  (No red)                                     see through such as: Fruit ices (not with fruit pulp)                                             milk, soups, orange juice              Iced Popsicles (No red)                                                All solid food                                   Apple juices   Sports drinks like Gatorade (No red) Lightly seasoned clear broth or consume(fat free) Sugar  Sample Menu Breakfast                                Lunch                                     Supper Cranberry juice                    Beef broth                            Chicken broth Jell-O                                     Grape juice  Apple juice Coffee or tea                        Jell-O                                      Popsicle                                                Coffee or tea                        Coffee or tea     Oral Hygiene is also important to reduce your risk of infection.                                    Remember - BRUSH YOUR TEETH THE MORNING OF SURGERY WITH YOUR REGULAR TOOTHPASTE   Take these medicines the morning of surgery with A SIP OF WATER: Amlodipine, Lipitor, Coreg, IMDUR                               You may not have any metal on your body including jewelry, and body piercing             Do not wear lotions, powders, cologne, or deodorant               Men may shave face and neck.   Do not bring valuables to the hospital. [place].    Patients discharged on the day of surgery will not be allowed to drive home.  Someone needs to stay with you for the first 24 hours after anesthesia.              Please read over the following fact sheets you were given: IF YOU HAVE QUESTIONS ABOUT YOUR PRE-OP INSTRUCTIONS PLEASE CALL [place] - Preparing for Surgery Before surgery, you can play an important role.  Because skin is not sterile, your skin needs to be as free of germs as possible.  You can reduce the number of germs on your skin by washing with CHG (chlorahexidine gluconate) soap before surgery.  CHG is an antiseptic cleaner which kills germs and bonds with the skin to continue killing germs even after washing. Please DO NOT use if you have an allergy to CHG or antibacterial soaps.  If your skin becomes reddened/irritated stop using the CHG and inform your nurse when you arrive at Short Stay. Do not shave (including legs and underarms) for at least 48 hours prior to the first CHG shower.  You may shave your face/neck.  Please follow these instructions carefully:  1.  Shower with CHG Soap the night before surgery and the  morning of surgery.  2.  If you choose to wash your hair, wash your hair first as usual with your normal  shampoo.  3.  After you shampoo, rinse your hair and body thoroughly to remove the shampoo.  4.  Use CHG as you would any other liquid soap.  You can apply chg directly to the skin and wash.  Gently with a scrungie or clean washcloth.  5.  Apply the CHG Soap to your body ONLY FROM THE NECK DOWN.   Do   not use on face/ open                           Wound  or open sores. Avoid contact with eyes, ears mouth and   genitals (private parts).                       Wash face,  Genitals (private parts) with your normal soap.             6.  Wash thoroughly, paying special attention to the area where your    surgery  will be performed.  7.  Thoroughly rinse your body with warm water from the neck down.  8.  DO NOT shower/wash with your normal soap after using and rinsing off the CHG Soap.                9.  Pat yourself dry with a clean towel.            10.  Wear clean pajamas.            11.  Place clean sheets on your bed the night of your first shower and do not  sleep with pets. Day of Surgery : Do not apply any lotions/deodorants the morning of surgery.  Please wear clean clothes to the hospital/surgery center.  FAILURE TO FOLLOW THESE INSTRUCTIONS MAY RESULT IN THE CANCELLATION OF YOUR SURGERY  PATIENT SIGNATURE_________________________________  NURSE SIGNATURE__________________________________  ________________________________________________________________________

## 2021-07-19 NOTE — Progress Notes (Signed)
PERIOPERATIVE PRESCRIPTION FOR IMPLANTED CARDIAC DEVICE PROGRAMMING  Patient Information: Name:  Steven [name]  DOB:  19-Jan-1956  MRN:  [id number]    Willeen Cass, RN  P Cv Div Heartcare Device Planned Procedure:  gold seed implant  Surgeon:  Dr. Alinda Money  Date of Procedure:  07/21/21  Cautery will be used.  Position during surgery:  unknown   Please send documentation back to:  [facility] (Fax # [phone])   Willeen Cass, RN  01/10/2021 10:13 AM  Device Information:  Clinic EP Physician:  Dr. Dani Gobble Croitoru   Device Type:  Defibrillator Manufacturer and Phone #:  [employer]: [id number] Pacemaker Dependent?:  No. Date of Last Device Check:  05/30/21 (inclinic) Normal Device Function?:  Yes.    Electrophysiologist's Recommendations:  Have magnet available. Provide continuous ECG monitoring when magnet is used or reprogramming is to be performed.  Procedure may interfere with device function.  Magnet should be placed over device during procedure.  Per Device Clinic [address], [place], RN  4:23 PM 07/19/2021

## 2021-07-20 ENCOUNTER — Other Ambulatory Visit: Payer: Self-pay

## 2021-07-20 ENCOUNTER — Encounter (HOSPITAL_COMMUNITY)
Admission: RE | Admit: 2021-07-20 | Discharge: 2021-07-20 | Disposition: A | Discharge status: Home / Self Care | Payer: 59 | Source: Ambulatory Visit | Attending: Urology | Admitting: Urology

## 2021-07-20 ENCOUNTER — Encounter (HOSPITAL_COMMUNITY): Payer: Self-pay

## 2021-07-20 VITALS — BP 97/59 | HR 80 | Temp 98.5°F | Resp 12 | Ht 70.0 in | Wt 162.0 lb

## 2021-07-20 DIAGNOSIS — C61 Malignant neoplasm of prostate: Secondary | ICD-10-CM | POA: Insufficient documentation

## 2021-07-20 DIAGNOSIS — Z95810 Presence of automatic (implantable) cardiac defibrillator: Secondary | ICD-10-CM | POA: Insufficient documentation

## 2021-07-20 DIAGNOSIS — Z87891 Personal history of nicotine dependence: Secondary | ICD-10-CM | POA: Diagnosis not present

## 2021-07-20 DIAGNOSIS — Z01812 Encounter for preprocedural laboratory examination: Secondary | ICD-10-CM | POA: Diagnosis present

## 2021-07-20 DIAGNOSIS — I2510 Atherosclerotic heart disease of native coronary artery without angina pectoris: Secondary | ICD-10-CM | POA: Diagnosis not present

## 2021-07-20 DIAGNOSIS — I255 Ischemic cardiomyopathy: Secondary | ICD-10-CM | POA: Insufficient documentation

## 2021-07-20 DIAGNOSIS — Z7901 Long term (current) use of anticoagulants: Secondary | ICD-10-CM

## 2021-07-20 DIAGNOSIS — R7303 Prediabetes: Secondary | ICD-10-CM | POA: Insufficient documentation

## 2021-07-20 DIAGNOSIS — Z01818 Encounter for other preprocedural examination: Secondary | ICD-10-CM

## 2021-07-20 DIAGNOSIS — I25118 Atherosclerotic heart disease of native coronary artery with other forms of angina pectoris: Secondary | ICD-10-CM

## 2021-07-20 HISTORY — DX: Prediabetes: R73.03

## 2021-07-20 LAB — GLUCOSE, CAPILLARY: Glucose-Capillary: 128 mg/dL — ABNORMAL HIGH (ref 70–99)

## 2021-07-20 LAB — BASIC METABOLIC PANEL
Anion gap: 6 (ref 5–15)
BUN: 30 mg/dL — ABNORMAL HIGH (ref 8–23)
CO2: 26 mmol/L (ref 22–32)
Calcium: 9.2 mg/dL (ref 8.9–10.3)
Chloride: 103 mmol/L (ref 98–111)
Creatinine, Ser: 1.39 mg/dL — ABNORMAL HIGH (ref 0.61–1.24)
GFR, Estimated: 56 mL/min — ABNORMAL LOW (ref >60)
Glucose, Bld: 143 mg/dL — ABNORMAL HIGH (ref 70–99)
Potassium: 4.2 mmol/L (ref 3.5–5.1)
Sodium: 135 mmol/L (ref 135–145)

## 2021-07-20 LAB — CBC
HCT: 40.7 % (ref 39.0–52.0)
Hemoglobin: 13.7 g/dL (ref 13.0–17.0)
MCH: 32.9 pg (ref 26.0–34.0)
MCHC: 33.7 g/dL (ref 30.0–36.0)
MCV: 97.6 fL (ref 80.0–100.0)
Platelets: 170 10*3/uL (ref 150–400)
RBC: 4.17 MIL/uL — ABNORMAL LOW (ref 4.22–5.81)
RDW: 13.3 % (ref 11.5–15.5)
WBC: 5.1 10*3/uL (ref 4.0–10.5)
nRBC: 0 % (ref 0.0–0.2)

## 2021-07-20 NOTE — H&P (Signed)
CC/HPI: CC: Prostate Cancer   Physician requesting consult: Dr. Eda Keys  PCP: [place]  Location of consult: [facility] - Prostate Cancer Multidisciplinary Clinic   [name] is a 66 year old gentleman initially from [place] who has a significant history of cardiovascular disease. 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 His resultant EF is about 20-25% due to his ischemic cardiomyopathy. He has an implanted ICD. He is also maintained on chronic anticoagulation with warfarin. His other medical comorbidities include hypertension, diabetes, and hyperlipidemia.   He was noted to have an elevated PSA in the spring of 2021 of 7.41. He underwent a TRUS biopsy by Dr. 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 Considering his significant medical comorbidities, he elected active surveillance. His PSA increased to 13.6 prompting a confirmatory biopsy on 03/11/21 and this indicated upgraded Gleason 4+3=7 adenocarcinoma with 5 out of 12 biopsy cores positive for malignancy. A PSMA PET scan was performed on 03/31/21 and was negative for metastatic disease.   Family history:   Imaging studies: PSMA PET scan (03/31/21) - Negative for metastatic disease.   PMH: He has a history of severe CAD with a history as outlined above with resultant EF of 20-25%, hypertension, hyperlipidemia, and diabetes.  PSH: ICD   TNM stage: cT1c N0 M0  PSA: 13.6  Gleason score: 4+3=7 (GG 3)  Biopsy (03/11/21): 5/12 cores positive  Left: L lateral apex (5%, 3+3=6), L apex (30%, 4+3=7), L mid (5%, 3+3=6)  Right: R apex (30%, 4+3=7), R mid (10%, 4+3=7)  Prostate volume: 67.6 cc   Nomogram  OC disease: 30%  EPE: 66%  SVI: 17%  LNI: 17%  PFS (5 year, 10 year): 48%, 33%   Urinary function: IPSS is 12.  Erectile function: SHIM score is 5. He does have refractory erectile dysfunction and currently is increasing his dose of  intracavernosal injection therapy under the care of Dr. Junious Silk.     ALLERGIES: No Allergies    MEDICATIONS: Tamsulosin Hcl 0.4 mg capsule 1 capsule PO Q HS  Tamsulosin Hcl 0.4 mg capsule 1 capsule PO Q HS  Warfarin Sodium 1 PO Daily  Amlodipine Besylate 2.5 mg tablet  Atorvastatin Calcium  Carvedilol 6.25 MG Oral Tablet Oral  Entresto 24 mg-26 mg tablet  Farxiga  Furosemide 40 MG Oral Tablet Oral  Gabapentin 300 mg capsule  Isosorbide  Nitroglycerin 0.4 MG/SPRAY Translingual Solution Translingual  Prostaglandin E1 100 % powder 1 ml Intracavernosal Daily PRN SUPERTRIMIX: 30 Pap 2 Phent 20 PGE per ml disp 1 ml syringe x 5  Spironolactone 25 MG Oral Tablet Oral     GU PSH: Penile Injection - 2019 Prostate Needle Biopsy - 03/11/2021, 12/15/2019       PSH Notes: Hernia Repair, Implantable Cardioverter-Defibrillator   NON-GU PSH: Hernia Repair - 2013 Surgical Pathology, Gross And Microscopic Examination For Prostate Needle - 03/11/2021, 12/15/2019     GU PMH: Prostate Cancer, I had a long discussion with the patient using the understanding prostate cancer booklet and his path report as a reference. We discussed his stage, grade and prognosis. We discussed the nature risks and benefits of active surveillance, radical prostatectomy, external beam radiotherapy, and brachytherapy. We discussed the role of androgen deprivation and chemotherapy in prostate cancer. We also discussed other ablative techniques such as HiFU and cryotherapy as well as whole gland versus focal treatment. We discussed specifically how each treatment might affect bowel, bladder  and sexual function. We discussed how each treatment might effect salvage treatments and active surveillance might lead to progression and more difficult treatment in the future. All questions answered. Also disc nature r/b of gold seeds and SpaceOar if needed. He is interested in surgical therapy, but his cardiac risk may be an issue. He's also  interested in brachytherapy, although prostate about 68 grams and a median lobe. I will refer to [place] so that he can see Dr. Alinda Money and Dr. Tammi Klippel to discuss. I ordered CT of neck/thyroid. Exam benign but not something I typically examine. - 04/05/2021, Prostate 68 grams. T1c. F/u as planned. , - 03/11/2021, requested PSA levels. Consdier MRI , - 01/14/2021, PSA stable. Consider MRI or bx next year. PSA in 6 mo , - 05/11/2020, I had a long discussion with the patient using the understanding prostate cancer booklet and his path report as a reference. We discussed his stage, grade and prognosis. We discussed the nature risks and benefits of active surveillance, radical prostatectomy, external beam radiotherapy, and brachytherapy. We discussed the role of androgen deprivation and chemotherapy in prostate cancer. We also discussed other ablative techniques such as HiFU and cryotherapy as well as whole gland versus focal treatment. We discussed specifically how each treatment might affect bowel, bladder and sexual function. We discussed how each treatment might effect salvage treatments and active surveillance might lead to progression and more difficult treatment in the future. All questions answered. He favors surveillance. We discussed PCa progression can occur and go from curable to incurable as well as be more difficult to treat in the future. He is concerned about ED and interested in further therapy. , - 02/09/2020 BPH w/LUTS - 01/14/2021, - 05/11/2020, - 2020, - 2019, - 2017, Benign localized prostatic hyperplasia with lower urinary tract symptoms (LUTS), - 2015 ED due to arterial insufficiency, disc nature r/b of pde5i, injection and off label use liswt. - 01/14/2021, DIsc EDex. Also generic trimix. I will send trimix but he wants to wait until next. , - 05/11/2020, cont injections , - 2021, - 2019, - 2018, - 2017, Erectile dysfunction due to arterial insufficiency, - 2017 Urinary Hesitancy - 01/14/2021, -  05/11/2020, - 2017 Elevated PSA - 12/15/2019, discussed rising levels and nature r/b/a to prostate biopsy. Discussed PSA levels > 10. Discussed management of PCa - surgery vs xrt. Given risk of stopping coumdin and of prostate bx, will check PSA one more time in 1 mo and then bx if elevated. , - 2021, - 2020, - 2020 Primary hypogonadism - 2019, - 2017, Hypogonadism, testicular, - 2017 Weak Urinary Stream - 2019      PMH Notes:  2011-10-09 16:00:37 - Note: Acute Myocardial Infarction   NON-GU PMH: Localized enlarged lymph nodes, possibly noted on PET and CT ordered - 04/05/2021 Nontoxic single thyroid nodule - 04/05/2021 Encounter for general adult medical examination without abnormal findings, Encounter for preventive health examination - 2016 Personal history of other diseases of the circulatory system, History of hypertension - 2014 Personal history of other endocrine, nutritional and metabolic disease, History of hypercholesterolemia - 2014    FAMILY HISTORY: Family Health Status Number - Runs In Family Father Deceased At Age57 ___ - [place] In Family Mother Deceased At Age 35 from diabetic complicati - Runs In Family No pertinent family history - Other   SOCIAL HISTORY: Marital Status: Single Preferred Language: English; Race: Black or African American Current Smoking Status: Patient does not smoke anymore.  Drinks 1 caffeinated drink per day.  Notes: Former smoker, Tobacco use, Marital History - Single, Currently On Disability, Caffeine Use, Alcohol Use   REVIEW OF SYSTEMS:    GU Review Male:   Patient denies frequent urination, hard to postpone urination, burning/ pain with urination, get up at night to urinate, leakage of urine, stream starts and stops, trouble starting your streams, and have to strain to urinate .  Gastrointestinal (Lower):   Patient denies diarrhea and constipation.  Gastrointestinal (Upper):   Patient denies vomiting and nausea.  Constitutional:   Patient  denies fever, night sweats, weight loss, and fatigue.  Skin:   Patient denies skin rash/ lesion and itching.  Eyes:   Patient denies blurred vision and double vision.  Ears/ Nose/ Throat:   Patient denies sore throat and sinus problems.  Hematologic/Lymphatic:   Patient denies swollen glands and easy bruising.  Cardiovascular:   Patient denies leg swelling and chest pains.  Respiratory:   Patient denies cough and shortness of breath.  Endocrine:   Patient denies excessive thirst.  Musculoskeletal:   Patient denies back pain and joint pain.  Neurological:   Patient denies headaches and dizziness.  Psychologic:   Patient denies depression and anxiety.   VITAL SIGNS: None   MULTI-SYSTEM PHYSICAL EXAMINATION:    Constitutional: Well-nourished. No physical deformities. Normally developed. Good grooming.     Complexity of Data:  Lab Test Review:   PSA  Records Review:   Pathology Reports, Previous Patient Records   01/28/21 10/29/20 05/06/20 09/02/19 07/29/19 02/04/19 11/11/18 09/30/18  PSA  Total PSA 13.60 ng/mL 10.10 ng/mL 8.58 ng/mL 7.41 ng/mL 8.90 ng/mL 4.59 ng/mL 4.67 ng/mL 4.87 ng/mL  Free PSA    0.91 ng/mL 0.16 ng/mL 0.94 ng/mL 0.93 ng/mL 1.11 ng/mL  % Free PSA    12 % PSA 2 % PSA 20 % PSA 20 % PSA 23 % PSA    02/04/19 11/11/18 09/30/18 09/12/17 03/27/16 12/25/14 03/12/14 08/11/13  Hormones  Testosterone, Total 507.0 ng/dL 456.8 ng/dL 524.2 ng/dL 539.1 ng/dL 558.8 pg/dL 688  568  678     PROCEDURES: None   ASSESSMENT:      ICD-10 Details  1 GU:   Prostate Cancer - C61    PLAN:     1. Unfavorable intermediate risk prostate cancer: I had a detailed discussion with Mr. Neiss today. We did discuss his overall medical condition especially related to his significant coronary artery disease and subsequent ischemic cardiomyopathy. Considering his upgraded disease and the concerning trend in his PSA, it was recommended that he strongly consider therapy of curative intent. Considering  the increased risk of surgical therapy with his cardiac disease, it was recommended that he strongly consider external beam radiation therapy.   The patient was counseled about the natural history of prostate cancer and the standard treatment options that are available for prostate cancer. It was explained to him how his age and life expectancy, clinical stage, Gleason score/prognostic grade group, and PSA (and PSA density) affect his prognosis, the decision to proceed with additional staging studies, as well as how that information influences recommended treatment strategies. We discussed the roles for active surveillance, radiation therapy, surgical therapy, androgen deprivation, as well as ablative therapy and other invesitgational options for the treatment of prostate cancer as appropriate to his individual cancer situation. We discussed the risks and benefits of these options with regard to their impact on cancer control and also in terms of potential adverse events, complications, and impact on quality of life particularly related to urinary  and sexual function. The patient was encouraged to ask questions throughout the discussion today and all questions were answered to his stated satisfaction. In addition, the patient was provided with and/or directed to appropriate resources and literature for further education about prostate cancer and treatment options.   He feels that he has referred informed and is interested in proceeding with external beam radiation therapy. I will notify Dr. Junious Silk and he is scheduled to see Dr. Tammi Klippel later this afternoon.   2. Erectile dysfunction: He did have multiple questions about his erectile function. He already has refractory erectile dysfunction I encouraged him to communicate with Dr. Junious Silk to further adjust his Trimix as needed.     He has elected to proceed with radiation and will undergo SpaceOAR insertion and fiducial marker placement.  I discussed the  potential benefits and risks of the procedure, side effects of the proposed treatment, the likelihood of the patient achieving the goals of the procedure, and any potential problems that might occur during the procedure or recuperation.

## 2021-07-20 NOTE — Progress Notes (Addendum)
Patient needs pharmacy rec. Patient stated that he does not know all of his medication off the top of his head and has a list at home. Patient states that he had tried to call back pharmacy yesterday during the time frame given to him in a voice mail but no one answered. Patient has to go back to work but he said he will call again once he is at home. I provided him with the phone number to call.  Patient expressed overall frustration with being contacted yesterday afternoon for an appointment today when he has work. And frustration about his surgery time being changed.

## 2021-07-20 NOTE — Progress Notes (Signed)
Anesthesia Chart Review   Case: [id number] Date/Time: 07/21/21 1245   Procedures:      GOLD SEED IMPLANT - NEEDS 30 MIN     SPACE OAR INSTILLATION   Anesthesia type: Monitor Anesthesia Care   Pre-op diagnosis: PROSTATE CANCER   Location: [place] / [facility]   Surgeons: Raynelle Bring, MD       DISCUSSION:66 y.o. former smoker with h/o CAD, ischemic cardiomyopathy (EF 20-25%), ICD in place (device orders in 07/19/2021 progress note), prostate cancer scheduled for above procedure 07/21/2021 with Dr. Raynelle Bring.   Per cardiology preoperative evaluation 06/16/21, "Chart reviewed as part of pre-operative protocol coverage. Given past medical history and time since last visit, based on ACC/AHA guidelines, Steven [name] would be at acceptable risk for the planned procedure without further cardiovascular testing.  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   Procedure: fiducial marker placement, Space Oar Date of procedure: 07/21/20 CrCl 60 Platelet count 178 Per office protocol, patient can hold warfarin for 5 days prior to procedure. Patient will not need bridging with Lovenox (enoxaparin) around procedure. 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 Previously cleared to hold warfarin for 5 days prior to other procedures without a Lovenox bridge."  Anticipate pt can proceed with planned procedure barring acute status change.   VS: BP (!) 97/59    Pulse 80    Temp 36.9 C (Oral)    Resp 12    Ht 5\' 10"  (1.778 m)    Wt 73.5 kg    SpO2 100%    BMI 23.24 kg/m   PROVIDERS: Ziglar, Lincoln Brigham, MD is PCP   Cardiologist - Shelva Majestic, MD Electrophysiologist- Sanda Klein, MD  LABS: Labs reviewed: Acceptable for surgery. (all labs ordered are listed, but only abnormal results  are displayed)  Labs Reviewed  GLUCOSE, CAPILLARY - Abnormal; Notable for the following components:      Result Value   Glucose-Capillary 128 (*)    All other components within normal limits  BASIC METABOLIC PANEL  CBC  HEMOGLOBIN A1C     IMAGES:   EKG: 12/28/2020 Rate 68 bpm  Atrial-paced rhythm with prolonged AV conduction with occasional premature ventricular complexes Anterolateral infarct, age undetermined  CV: Echo 01/05/2020 1. Since the last study on 04/18/2017 remains very low at 20-25% with  apical aneurysm and no thrombus on Definity echocontrast images.   2. Left ventricular ejection fraction, by estimation, is 20 to 25%. The  left ventricle has severely decreased function. The left ventricle  demonstrates global hypokinesis. The left ventricular internal cavity size  was moderately dilated. Left  ventricular diastolic parameters are consistent with Grade I diastolic  dysfunction (impaired relaxation).   3. Right ventricular systolic function is normal. The right ventricular  size is normal. There is normal pulmonary artery systolic pressure. The  estimated right ventricular systolic pressure is 91.4 mmHg.   4. The mitral valve is normal in structure. Mild mitral valve  regurgitation. No evidence of mitral stenosis.   5. The aortic valve is normal in structure. Aortic valve regurgitation is  not visualized. No aortic stenosis is present.   6. The inferior vena cava is normal in size with greater than 50%  respiratory variability, suggesting right atrial pressure of 3 mmHg. Past Medical History:  Diagnosis Date  Acute MI, anterior wall (HCC)    CAD (coronary artery disease)    2D ECHO, 07/13/2011 - EF <25%, LV moderatelty dilated, LA moderately dilatedLEXISCAN, 12/14/2011 - moderate-severe perfusion defect seen in the basal anteroseptal, mid anterior, apicacl anterior, apical, apical inferior, and apical lateral regions, post-stress EF 25%, new EKG changes from  baseline abnormalities   Inguinal hernia, left    Pre-diabetes     Past Surgical History:  Procedure Laterality Date   CARDIAC CATHETERIZATION  11/25/2010   Predilation balloon-Apex monorail 2x76mm, Cutting balloon-2.25x64mm, resulting in a reduction of 100% stenosis down to less than 10%   CARDIAC CATHETERIZATION  07/12/2010   LAD stented with a 3.5x102mm bare-metal Veriflex stent resulting in a reduction of 100% lesion to 0%   CARDIAC DEFIBRILLATOR PLACEMENT  03/02/2011   Medtronic Protecta XT DR, model #[id number], serial #[id number]   ICD GENERATOR CHANGEOUT N/A 02/24/2019   Procedure: ICD GENERATOR CHANGEOUT;  Surgeon: Sanda Klein, MD;  Location: [facility];  Service: Cardiovascular;  Laterality: N/A;   PACEMAKER PLACEMENT  07/14/2010   Temporary placement of pacemaker, if rhythm issue continues will need a permanent device    MEDICATIONS:  albuterol (PROVENTIL HFA;VENTOLIN HFA) 108 (90 Base) MCG/ACT inhaler   albuterol (PROVENTIL) (2.5 MG/3ML) 0.083% nebulizer solution   amLODipine (NORVASC) 2.5 MG tablet   atorvastatin (LIPITOR) 40 MG tablet   atorvastatin (LIPITOR) 80 MG tablet   carvedilol (COREG) 25 MG tablet   Cholecalciferol (VITAMIN D) 125 MCG (5000 UT) CAPS   dapagliflozin propanediol (FARXIGA) 10 MG TABS tablet   fluticasone (FLONASE) 50 MCG/ACT nasal spray   furosemide (LASIX) 20 MG tablet   isosorbide mononitrate (IMDUR) 120 MG 24 hr tablet   montelukast (SINGULAIR) 10 MG tablet   Multiple Vitamin (MULTIVITAMIN) tablet   nitroGLYCERIN (NITROSTAT) 0.4 MG SL tablet   sacubitril-valsartan (ENTRESTO) 24-26 MG   spironolactone (ALDACTONE) 25 MG tablet   tamsulosin (FLOMAX) 0.4 MG CAPS capsule   Tiotropium Bromide-Olodaterol (STIOLTO RESPIMAT) 2.5-2.5 MCG/ACT AERS   warfarin (COUMADIN) 5 MG tablet   No current facility-administered medications for this encounter.    Konrad Felix Ward, PA-C [facility] [phone]

## 2021-07-20 NOTE — Anesthesia Preprocedure Evaluation (Addendum)
Anesthesia Evaluation  Patient identified by MRN, date of birth, ID band Patient awake    Reviewed: Allergy & Precautions, NPO status , Patient's Chart, lab work & pertinent test results  History of Anesthesia Complications Negative for: history of anesthetic complications  Airway Mallampati: II  TM Distance: >3 FB Neck ROM: Full    Dental  (+) Dental Advisory Given   Pulmonary former smoker,    Pulmonary exam normal        Cardiovascular hypertension, Pt. on medications + CAD, + Past MI and +CHF  Normal cardiovascular exam+ Cardiac Defibrillator    '21 TTE - EF 20 to 25%. Global hypokinesis. The left ventricular internal cavity size was moderately dilated. Grade I diastolic dysfunction (impaired relaxation). Mild mitral valve regurgitation.     Neuro/Psych negative neurological ROS  negative psych ROS   GI/Hepatic negative GI ROS, Neg liver ROS,   Endo/Other   Pre-DM   Renal/GU Renal InsufficiencyRenal disease    Prostate cancer     Musculoskeletal negative musculoskeletal ROS (+)   Abdominal   Peds  Hematology  On coumadin for hx of mural thrombus 2015     Anesthesia Other Findings   Reproductive/Obstetrics                           Anesthesia Physical Anesthesia Plan  ASA: 4  Anesthesia Plan: MAC   Post-op Pain Management: Tylenol PO (pre-op)   Induction:   PONV Risk Score and Plan: 2 and Propofol infusion and Treatment may vary due to age or medical condition  Airway Management Planned: Natural Airway and Simple Face Mask  Additional Equipment: None  Intra-op Plan:   Post-operative Plan:   Informed Consent: I have reviewed the patients History and Physical, chart, labs and discussed the procedure including the risks, benefits and alternatives for the proposed anesthesia with the patient or authorized representative who has indicated his/her understanding and  acceptance.       Plan Discussed with: CRNA and Anesthesiologist  Anesthesia Plan Comments:      Anesthesia Quick Evaluation

## 2021-07-21 ENCOUNTER — Encounter (HOSPITAL_COMMUNITY)
Admission: RE | Disposition: A | Discharge status: Home / Self Care | Payer: Self-pay | Source: Home / Self Care | Attending: Urology

## 2021-07-21 ENCOUNTER — Ambulatory Visit (HOSPITAL_COMMUNITY): Payer: 59 | Admitting: Anesthesiology

## 2021-07-21 ENCOUNTER — Encounter (HOSPITAL_COMMUNITY): Payer: Self-pay | Admitting: Urology

## 2021-07-21 ENCOUNTER — Ambulatory Visit (HOSPITAL_COMMUNITY)
Admission: RE | Admit: 2021-07-21 | Discharge: 2021-07-21 | Disposition: A | Discharge status: Home / Self Care | Payer: 59 | Attending: Urology | Admitting: Urology

## 2021-07-21 DIAGNOSIS — I252 Old myocardial infarction: Secondary | ICD-10-CM | POA: Insufficient documentation

## 2021-07-21 DIAGNOSIS — I110 Hypertensive heart disease with heart failure: Secondary | ICD-10-CM | POA: Insufficient documentation

## 2021-07-21 DIAGNOSIS — N289 Disorder of kidney and ureter, unspecified: Secondary | ICD-10-CM | POA: Insufficient documentation

## 2021-07-21 DIAGNOSIS — I509 Heart failure, unspecified: Secondary | ICD-10-CM | POA: Insufficient documentation

## 2021-07-21 DIAGNOSIS — I255 Ischemic cardiomyopathy: Secondary | ICD-10-CM | POA: Insufficient documentation

## 2021-07-21 DIAGNOSIS — Z7901 Long term (current) use of anticoagulants: Secondary | ICD-10-CM | POA: Diagnosis not present

## 2021-07-21 DIAGNOSIS — Z87891 Personal history of nicotine dependence: Secondary | ICD-10-CM | POA: Diagnosis not present

## 2021-07-21 DIAGNOSIS — I2510 Atherosclerotic heart disease of native coronary artery without angina pectoris: Secondary | ICD-10-CM | POA: Insufficient documentation

## 2021-07-21 DIAGNOSIS — C61 Malignant neoplasm of prostate: Secondary | ICD-10-CM | POA: Insufficient documentation

## 2021-07-21 DIAGNOSIS — Z95810 Presence of automatic (implantable) cardiac defibrillator: Secondary | ICD-10-CM | POA: Insufficient documentation

## 2021-07-21 HISTORY — PX: GOLD SEED IMPLANT: SHX6343

## 2021-07-21 HISTORY — PX: SPACE OAR INSTILLATION: SHX6769

## 2021-07-21 LAB — HEMOGLOBIN A1C
Hgb A1c MFr Bld: 5.8 % — ABNORMAL HIGH (ref 4.8–5.6)
Mean Plasma Glucose: 120 mg/dL

## 2021-07-21 LAB — PROTIME-INR
INR: 1.3 — ABNORMAL HIGH (ref 0.8–1.2)
Prothrombin Time: 16.6 seconds — ABNORMAL HIGH (ref 11.4–15.2)

## 2021-07-21 LAB — APTT: aPTT: 30 seconds (ref 24–36)

## 2021-07-21 SURGERY — INSERTION, GOLD SEEDS
Anesthesia: Monitor Anesthesia Care

## 2021-07-21 MED ORDER — 0.9 % SODIUM CHLORIDE (POUR BTL) OPTIME
TOPICAL | Status: DC | PRN
Start: 2021-07-21 — End: 2021-07-21
  Administered 2021-07-21: 1000 mL

## 2021-07-21 MED ORDER — CHLORHEXIDINE GLUCONATE 0.12 % MT SOLN
15.0000 mL | Freq: Once | OROMUCOSAL | Status: AC
  Administered 2021-07-21: 15 mL via OROMUCOSAL

## 2021-07-21 MED ORDER — CEFAZOLIN SODIUM-DEXTROSE 2-4 GM/100ML-% IV SOLN
2.0000 g | Freq: Once | INTRAVENOUS | Status: AC
  Administered 2021-07-21: 2 g via INTRAVENOUS
  Filled 2021-07-21: qty 100

## 2021-07-21 MED ORDER — BUPIVACAINE HCL (PF) 0.25 % IJ SOLN
INTRAMUSCULAR | Status: DC | PRN
  Administered 2021-07-21: 20 mL

## 2021-07-21 MED ORDER — ORAL CARE MOUTH RINSE: 15.0000 mL | Freq: Once | OROMUCOSAL | Status: AC

## 2021-07-21 MED ORDER — MIDAZOLAM HCL 5 MG/5ML IJ SOLN
INTRAMUSCULAR | Status: DC | PRN
  Administered 2021-07-21: 1 mg via INTRAVENOUS

## 2021-07-21 MED ORDER — OXYCODONE HCL 5 MG/5ML PO SOLN: 5.0000 mg | Freq: Once | ORAL | Status: DC | PRN

## 2021-07-21 MED ORDER — ONDANSETRON HCL 4 MG/2ML IJ SOLN
INTRAMUSCULAR | Status: AC
  Filled 2021-07-21: qty 2

## 2021-07-21 MED ORDER — PROPOFOL 500 MG/50ML IV EMUL
INTRAVENOUS | Status: DC | PRN
  Administered 2021-07-21: 50 ug/kg/min via INTRAVENOUS

## 2021-07-21 MED ORDER — ONDANSETRON HCL 4 MG/2ML IJ SOLN: 4.0000 mg | Freq: Once | INTRAMUSCULAR | Status: DC | PRN

## 2021-07-21 MED ORDER — DEXAMETHASONE SODIUM PHOSPHATE 10 MG/ML IJ SOLN
INTRAMUSCULAR | Status: DC | PRN
  Administered 2021-07-21: 7 mg via INTRAVENOUS

## 2021-07-21 MED ORDER — ONDANSETRON HCL 4 MG/2ML IJ SOLN
INTRAMUSCULAR | Status: DC | PRN
Start: 2021-07-21 — End: 2021-07-21
  Administered 2021-07-21: 4 mg via INTRAVENOUS

## 2021-07-21 MED ORDER — FENTANYL CITRATE (PF) 100 MCG/2ML IJ SOLN
INTRAMUSCULAR | Status: AC
  Filled 2021-07-21: qty 2

## 2021-07-21 MED ORDER — ACETAMINOPHEN 500 MG PO TABS
1000.0000 mg | ORAL_TABLET | Freq: Once | ORAL | Status: AC
  Administered 2021-07-21: 1000 mg via ORAL
  Filled 2021-07-21: qty 2

## 2021-07-21 MED ORDER — MIDAZOLAM HCL 2 MG/2ML IJ SOLN
INTRAMUSCULAR | Status: AC
  Filled 2021-07-21: qty 2

## 2021-07-21 MED ORDER — SODIUM CHLORIDE (PF) 0.9 % IJ SOLN
INTRAMUSCULAR | Status: AC
  Filled 2021-07-21: qty 20

## 2021-07-21 MED ORDER — OXYCODONE HCL 5 MG PO TABS: 5.0000 mg | ORAL_TABLET | Freq: Once | ORAL | Status: DC | PRN

## 2021-07-21 MED ORDER — DEXAMETHASONE SODIUM PHOSPHATE 10 MG/ML IJ SOLN
INTRAMUSCULAR | Status: AC
  Filled 2021-07-21: qty 1

## 2021-07-21 MED ORDER — FLEET ENEMA 7-19 GM/118ML RE ENEM
1.0000 | ENEMA | Freq: Once | RECTAL | Status: AC
  Administered 2021-07-21: 1 via RECTAL

## 2021-07-21 MED ORDER — PROPOFOL 10 MG/ML IV BOLUS
INTRAVENOUS | Status: DC | PRN
  Administered 2021-07-21: 30 mg via INTRAVENOUS

## 2021-07-21 MED ORDER — LIDOCAINE HCL (PF) 2 % IJ SOLN
INTRAMUSCULAR | Status: AC
  Filled 2021-07-21: qty 5

## 2021-07-21 MED ORDER — FENTANYL CITRATE PF 50 MCG/ML IJ SOSY: 25.0000 ug | PREFILLED_SYRINGE | INTRAMUSCULAR | Status: DC | PRN

## 2021-07-21 MED ORDER — LIDOCAINE 2% (20 MG/ML) 5 ML SYRINGE
INTRAMUSCULAR | Status: DC | PRN
  Administered 2021-07-21: 50 mg via INTRAVENOUS

## 2021-07-21 MED ORDER — PHENYLEPHRINE HCL (PRESSORS) 10 MG/ML IV SOLN
INTRAVENOUS | Status: DC | PRN
Start: 2021-07-21 — End: 2021-07-21
  Administered 2021-07-21 (×2): 120 ug via INTRAVENOUS

## 2021-07-21 MED ORDER — FENTANYL CITRATE (PF) 100 MCG/2ML IJ SOLN
INTRAMUSCULAR | Status: DC | PRN
  Administered 2021-07-21: 50 ug via INTRAVENOUS

## 2021-07-21 MED ORDER — LACTATED RINGERS IV SOLN: INTRAVENOUS | Status: DC

## 2021-07-21 MED ORDER — BUPIVACAINE HCL 0.25 % IJ SOLN
INTRAMUSCULAR | Status: AC
  Filled 2021-07-21: qty 1

## 2021-07-21 MED ORDER — PROPOFOL 10 MG/ML IV BOLUS
INTRAVENOUS | Status: AC
  Filled 2021-07-21: qty 20

## 2021-07-21 SURGICAL SUPPLY — 23 items
BAG COUNTER SPONGE SURGICOUNT (BAG) IMPLANT
BAG SPNG CNTER NS LX DISP (BAG)
DRSG TEGADERM 4X4.75 (GAUZE/BANDAGES/DRESSINGS) ×4 IMPLANT
DRSG TEGADERM 8X12 (GAUZE/BANDAGES/DRESSINGS) ×4 IMPLANT
GLOVE SURG ENC MOIS LTX SZ7.5 (GLOVE) ×2 IMPLANT
GLOVE SURG NEOPR MICRO LF SZ8 (GLOVE) ×2 IMPLANT
GOWN STRL REUS W/TWL LRG LVL3 (GOWN DISPOSABLE) ×2 IMPLANT
IMPL SPACEOAR VUE SYSTEM (Spacer) ×1 IMPLANT
IMPLANT SPACEOAR VUE SYSTEM (Spacer) ×2 IMPLANT
MARKER GOLD PRELOAD 1.2X3 (Urological Implant) ×1 IMPLANT
NDL SAFETY ECLIPSE 18X1.5 (NEEDLE) IMPLANT
NDL SPNL 22GX7 QUINCKE BK (NEEDLE) IMPLANT
NEEDLE HYPO 18GX1.5 SHARP (NEEDLE)
NEEDLE SPNL 22GX7 QUINCKE BK (NEEDLE) IMPLANT
PACK CYSTO (CUSTOM PROCEDURE TRAY) ×2 IMPLANT
PENCIL SMOKE EVACUATOR (MISCELLANEOUS) IMPLANT
SEED GOLD PRELOAD 1.2X3 (Urological Implant) ×2 IMPLANT
SURGILUBE 2OZ TUBE FLIPTOP (MISCELLANEOUS) ×2 IMPLANT
SYR CONTROL 10ML LL (SYRINGE) ×1 IMPLANT
TOWEL OR 17X26 10 PK STRL BLUE (TOWEL DISPOSABLE) ×2 IMPLANT
TUBING CONNECTING 10 (TUBING) IMPLANT
UNDERPAD 30X36 HEAVY ABSORB (UNDERPADS AND DIAPERS) ×4 IMPLANT
WATER STERILE IRR 500ML POUR (IV SOLUTION) ×2 IMPLANT

## 2021-07-21 NOTE — Anesthesia Postprocedure Evaluation (Signed)
Anesthesia Post Note  Patient: Steven [name]  Procedure(s) Performed: GOLD SEED IMPLANT SPACE OAR INSTILLATION     Patient location during evaluation: PACU Anesthesia Type: MAC Level of consciousness: awake and alert Pain management: pain level controlled Vital Signs Assessment: post-procedure vital signs reviewed and stable Respiratory status: spontaneous breathing, nonlabored ventilation and respiratory function stable Cardiovascular status: stable and blood pressure returned to baseline Anesthetic complications: no   No notable events documented.  Last Vitals:  Vitals:   07/21/21 1415 07/21/21 1430  BP: 98/65 108/69  Pulse: 60 68  Resp: 15   Temp: 36.6 C   SpO2: 96% 98%    Last Pain:  Vitals:   07/21/21 1415  TempSrc:   PainSc: 0-No pain                 Audry Pili

## 2021-07-21 NOTE — Progress Notes (Signed)
Patient ID: Steven [name], male   DOB: Jul 18, 1955, 66 y.o.   MRN: [id number]  To whom it may concern:  Steven [name] may resume work tomorrow 07/22/21 without restrictions.   Pryor Curia. MD (07/21/21)

## 2021-07-21 NOTE — Op Note (Signed)
Preoperative diagnosis: Prostate cancer   Postoperative diagnosis: Prostate cancer   Procedure:  1) Fiducial marker placement into the prostate 2) Insertion of SpaceOAR hydrogel    Surgeon: Lashawnda Hancox S. Sheronda Parran, Jr. M.D.   Anesthesia: IV sedation, Local anesthesia   EBL: Minimal   Complications: None   Indication: The potential risks, complications, alternative options, and expected recovery course associated with the above procedure(s) have been discussed in detail with the patient and he has provided informed consent to proceed.   Description of procedure: The patient was administered preoperative antibiotics, placed in the dorsal lithotomy position, and prepped and draped in the usual sterile fashion. A preoperative time out was performed.  Next, transrectal ultrasonography was utilized to visualize the prostate. 10 cc of 0.25% bupivacaine was then used to infiltrate the subcuateous tissue of the perineum and an additional 10 cc was injected into the lateral apical tissue surrounding the prostate for a periprostatic nerve block.  Three gold fiducial markers were then placed into the prostate via transperineal needles under ultrasound guidance at the left apex, left base, and right mid gland under direct ultrasound guidance.  A site in the midline was then selected on the perineum for placement of an 18 g needle with saline.  The needle was advanced above the rectum and below Denonvillier's fascia to the mid gland and confirmed to be in the midline on transverse imaging.  One cc of saline was injected confirming appropriate expansion of this space.  A total of 5-10 cc of saline was then injected to open the space further bilaterally.  The saline syringe was then removed and the SpaceOAR hydrogel was injected with good distribution bilaterally. He tolerated the procedure well and without complications. He was able to be awakened and transferred to the PACU in stable condition.  

## 2021-07-21 NOTE — Transfer of Care (Signed)
Immediate Anesthesia Transfer of Care Note  Patient: Steven [name]  Procedure(s) Performed: GOLD SEED IMPLANT SPACE OAR INSTILLATION  Patient Location: PACU  Anesthesia Type:MAC  Level of Consciousness: awake, sedated and patient cooperative  Airway & Oxygen Therapy: Patient Spontanous Breathing and Patient connected to face mask oxygen  Post-op Assessment: Report given to RN, Post -op Vital signs reviewed and stable and Patient moving all extremities  Post vital signs: stable  Last Vitals:  Vitals Value Taken Time  BP 98/67 07/21/21 1317  Temp    Pulse 60 07/21/21 1324  Resp 10 07/21/21 1324  SpO2 100 % 07/21/21 1324  Vitals shown include unvalidated device data.  Last Pain:  Vitals:   07/21/21 1125  TempSrc: Oral  PainSc: 0-No pain         Complications: No notable events documented.

## 2021-07-21 NOTE — Discharge Instructions (Addendum)
You should avoid strenuous activities today but may resume all normal activities tomorrow.  2.   You can take Tylenol as needed for any pain or discomfort.  3.    Follow up with your radiation oncologist for your simulation appointment as scheduled.  If this is not currently scheduled or you do not know the date/time for that appointment, please contact the radiation oncology office to confirm.  

## 2021-07-21 NOTE — Anesthesia Procedure Notes (Signed)
Procedure Name: MAC Date/Time: 07/21/2021 12:42 PM Performed by: Lissa Morales, CRNA Pre-anesthesia Checklist: Patient identified, Emergency Drugs available, Suction available, Patient being monitored and Timeout performed Patient Re-evaluated:Patient Re-evaluated prior to induction Oxygen Delivery Method: Simple face mask Placement Confirmation: positive ETCO2

## 2021-07-22 ENCOUNTER — Encounter (HOSPITAL_COMMUNITY): Payer: Self-pay | Admitting: Urology

## 2021-07-25 ENCOUNTER — Telehealth: Payer: Self-pay | Admitting: *Deleted

## 2021-07-25 ENCOUNTER — Ambulatory Visit (INDEPENDENT_AMBULATORY_CARE_PROVIDER_SITE_OTHER): Payer: 59

## 2021-07-25 ENCOUNTER — Other Ambulatory Visit: Payer: Self-pay

## 2021-07-25 DIAGNOSIS — Z7901 Long term (current) use of anticoagulants: Secondary | ICD-10-CM

## 2021-07-25 DIAGNOSIS — I513 Intracardiac thrombosis, not elsewhere classified: Secondary | ICD-10-CM

## 2021-07-25 DIAGNOSIS — Z5181 Encounter for therapeutic drug level monitoring: Secondary | ICD-10-CM

## 2021-07-25 LAB — POCT INR: INR: 1.6 — AB (ref 2.0–3.0)

## 2021-07-25 NOTE — Patient Instructions (Signed)
TAKE ANOTHER 0.5 TABLET TONIGHT and then  Continue with 1 tablet daily except 1/2 tablet each Monday, Wednesday and Friday. Recheck INR in 3 weeks. Coumadin Clinic [phone]

## 2021-07-25 NOTE — Telephone Encounter (Signed)
CALLED PATIENT TO REMIND OF SIM APPT. FOR 07-26-21- ARRIVAL TIME- 10:45 AM @ [facility], PATIENT TO ARRIVE WITH FULL BLADDER AND AN EMPTY BOWEL, LVM FOR A RETURN CALL

## 2021-07-26 ENCOUNTER — Ambulatory Visit
Admission: RE | Admit: 2021-07-26 | Discharge: 2021-07-26 | Disposition: A | Discharge status: Home / Self Care | Payer: 59 | Source: Ambulatory Visit | Attending: Radiation Oncology | Admitting: Radiation Oncology

## 2021-07-26 DIAGNOSIS — Z510 Encounter for antineoplastic radiation therapy: Secondary | ICD-10-CM | POA: Insufficient documentation

## 2021-07-26 DIAGNOSIS — C61 Malignant neoplasm of prostate: Secondary | ICD-10-CM | POA: Insufficient documentation

## 2021-07-27 ENCOUNTER — Other Ambulatory Visit: Payer: Self-pay | Admitting: Cardiovascular Disease

## 2021-07-28 NOTE — Progress Notes (Signed)
°  Radiation Oncology         [phone] ________________________________  Name: DELROY ORDWAY MRN: [id number]  Date: 07/26/2021  DOB: 1955/10/26  SIMULATION AND TREATMENT PLANNING NOTE    ICD-10-CM   1. Malignant neoplasm of prostate ([place])  C61       DIAGNOSIS:  66 y.o. gentleman with stage T1c adenocarcinoma of the prostate with a Gleason's score of 4+3 and a PSA of 13.6  NARRATIVE:  The patient was brought to the [facility].  Identity was confirmed.  All relevant records and images related to the planned course of therapy were reviewed.  The patient freely provided informed written consent to proceed with treatment after reviewing the details related to the planned course of therapy. The consent form was witnessed and verified by the simulation staff.  Then, the patient was set-up in a stable reproducible supine position for radiation therapy.  A vacuum lock pillow device was custom fabricated to position his legs in a reproducible immobilized position.  Then, I performed a urethrogram under sterile conditions to identify the prostatic apex.  CT images were obtained.  Surface markings were placed.  The CT images were loaded into the planning software.  Then the prostate target and avoidance structures including the rectum, bladder, bowel and hips were contoured.  Treatment planning then occurred.  The radiation prescription was entered and confirmed.  A total of one complex treatment devices was fabricated. I have requested : Intensity Modulated Radiotherapy (IMRT) is medically necessary for this case for the following reason:  Rectal sparing.[name]  PLAN:  The patient will receive 70 Gy in 28 fractions.  ________________________________  Sheral Apley Tammi Klippel, M.D.

## 2021-08-03 DIAGNOSIS — C61 Malignant neoplasm of prostate: Secondary | ICD-10-CM | POA: Insufficient documentation

## 2021-08-04 ENCOUNTER — Ambulatory Visit
Admission: RE | Admit: 2021-08-04 | Discharge: 2021-08-04 | Disposition: A | Discharge status: Home / Self Care | Payer: 59 | Source: Ambulatory Visit | Attending: Radiation Oncology | Admitting: Radiation Oncology

## 2021-08-04 DIAGNOSIS — C61 Malignant neoplasm of prostate: Secondary | ICD-10-CM | POA: Diagnosis not present

## 2021-08-05 ENCOUNTER — Other Ambulatory Visit: Payer: Self-pay

## 2021-08-05 ENCOUNTER — Ambulatory Visit
Admission: RE | Admit: 2021-08-05 | Discharge: 2021-08-05 | Disposition: A | Discharge status: Home / Self Care | Payer: 59 | Source: Ambulatory Visit | Attending: Radiation Oncology | Admitting: Radiation Oncology

## 2021-08-05 DIAGNOSIS — C61 Malignant neoplasm of prostate: Secondary | ICD-10-CM | POA: Diagnosis not present

## 2021-08-08 ENCOUNTER — Other Ambulatory Visit: Payer: Self-pay

## 2021-08-08 ENCOUNTER — Ambulatory Visit: Payer: 59 | Admitting: Gastroenterology

## 2021-08-08 ENCOUNTER — Ambulatory Visit
Admission: RE | Admit: 2021-08-08 | Discharge: 2021-08-08 | Disposition: A | Discharge status: Home / Self Care | Payer: 59 | Source: Ambulatory Visit | Attending: Radiation Oncology | Admitting: Radiation Oncology

## 2021-08-08 DIAGNOSIS — I10 Essential (primary) hypertension: Secondary | ICD-10-CM

## 2021-08-08 DIAGNOSIS — C61 Malignant neoplasm of prostate: Secondary | ICD-10-CM | POA: Diagnosis not present

## 2021-08-08 DIAGNOSIS — S0990XA Unspecified injury of head, initial encounter: Secondary | ICD-10-CM | POA: Insufficient documentation

## 2021-08-08 DIAGNOSIS — F1010 Alcohol abuse, uncomplicated: Secondary | ICD-10-CM | POA: Insufficient documentation

## 2021-08-08 DIAGNOSIS — S065XAA Traumatic subdural hemorrhage with loss of consciousness status unknown, initial encounter: Secondary | ICD-10-CM | POA: Insufficient documentation

## 2021-08-08 DIAGNOSIS — I219 Acute myocardial infarction, unspecified: Secondary | ICD-10-CM | POA: Insufficient documentation

## 2021-08-08 DIAGNOSIS — I4891 Unspecified atrial fibrillation: Secondary | ICD-10-CM | POA: Insufficient documentation

## 2021-08-08 HISTORY — DX: Essential (primary) hypertension: I10

## 2021-08-08 NOTE — Progress Notes (Deleted)
Jonathon Bellows MD, MRCP(U.K) [address]  [place]  [place], [place] [address]  Main: [phone]  Fax: [phone]   Gastroenterology Consultation  Referring Provider:     Celene Squibb, MD Primary Care Physician:  Macarthur Critchley, MD Primary Gastroenterologist:  Dr. Jonathon Bellows  Reason for Consultation:     Abdominal pain        HPI:   Steven [name] is a 66 y.o. y/o male referred for consultation & management  by Dr. Magnus Ivan, Lincoln Brigham, MD.     06/17/2021: CT scan of the abdomen shows no abnormality  Past Medical History:  Diagnosis Date   Acute MI, anterior wall ([place])    CAD (coronary artery disease)    2D ECHO, 07/13/2011 - EF <25%, LV moderatelty dilated, LA moderately dilatedLEXISCAN, 12/14/2011 - moderate-severe perfusion defect seen in the basal anteroseptal, mid anterior, apicacl anterior, apical, apical inferior, and apical lateral regions, post-stress EF 25%, new EKG changes from baseline abnormalities   Inguinal hernia, left    Pre-diabetes     Past Surgical History:  Procedure Laterality Date   CARDIAC CATHETERIZATION  11/25/2010   Predilation balloon-Apex monorail 2x12mm, Cutting balloon-2.25x45mm, resulting in a reduction of 100% stenosis down to less than 10%   CARDIAC CATHETERIZATION  07/12/2010   LAD stented with a 3.5x69mm bare-metal Veriflex stent resulting in a reduction of 100% lesion to 0%   CARDIAC DEFIBRILLATOR PLACEMENT  03/02/2011   Medtronic Protecta XT DR, model #[id number], serial #[id number]   GOLD SEED IMPLANT N/A 07/21/2021   Procedure: GOLD SEED IMPLANT;  Surgeon: Raynelle Bring, MD;  Location: [facility];  Service: Urology;  Laterality: N/A;  NEEDS 30 MIN   ICD GENERATOR CHANGEOUT N/A 02/24/2019   Procedure: ICD GENERATOR CHANGEOUT;  Surgeon: Sanda Klein, MD;  Location: [facility];  Service: Cardiovascular;  Laterality: N/A;   PACEMAKER PLACEMENT  07/14/2010   Temporary placement of pacemaker, if rhythm issue continues will need a  permanent device   SPACE OAR INSTILLATION N/A 07/21/2021   Procedure: SPACE OAR INSTILLATION;  Surgeon: Raynelle Bring, MD;  Location: [facility];  Service: Urology;  Laterality: N/A;    Prior to Admission medications   Medication Sig Start Date End Date Taking? Authorizing Provider  amLODipine (NORVASC) 2.5 MG tablet Take 2.5 mg by mouth daily.    [provider]  atorvastatin (LIPITOR) 80 MG tablet Take 80 mg by mouth daily. 02/21/21   [provider]  carvedilol (COREG) 25 MG tablet TAKE 1 TABLET BY MOUTH TWICE DAILY WITH A MEAL Patient taking differently: Take 25 mg by mouth 2 (two) times daily with a meal. 07/12/21   Croitoru, Mihai, MD  Cholecalciferol (VITAMIN D) 125 MCG (5000 UT) CAPS Take 5,000 Units by mouth daily.    [provider]  dapagliflozin propanediol (FARXIGA) 10 MG TABS tablet Take 10 mg by mouth daily.    [provider]  furosemide (LASIX) 20 MG tablet Take 1 tablet (20 mg total) by mouth daily. 07/21/20   Troy Sine, MD  isosorbide mononitrate (IMDUR) 120 MG 24 hr tablet Take 1 tablet (120 mg total) by mouth daily. 09/13/20 07/21/21  Troy Sine, MD  Multiple Vitamin (MULTIVITAMIN) tablet Take 1 tablet by mouth daily.    [provider]  nitroGLYCERIN (NITROSTAT) 0.4 MG SL tablet Place 1 tablet (0.4 mg total) under the tongue every 5 (five) minutes as needed. Usual dose is q 5 minutes x 3 doses. 07/27/21  Troy Sine, MD  sacubitril-valsartan [facility]) 24-26 MG Take 1 tablet by mouth twice daily 05/25/21   Troy Sine, MD  spironolactone (ALDACTONE) 25 MG tablet Take 12.5 mg by mouth daily.    [provider]  tamsulosin (FLOMAX) 0.4 MG CAPS capsule Take 0.4 mg by mouth at bedtime.    [provider]  warfarin (COUMADIN) 5 MG tablet Take 1 to 2 tablets daily as directed by the coumadin clinic Patient taking differently: Take 2.5-5 mg by mouth See admin instructions. Takes 1/2 tablet MWF, takes 1 tablet  STThS. 06/14/20   Croitoru, Dani Gobble, MD    No family history on file.   Social History   Tobacco Use   Smoking status: Former    Types: Cigarettes, Cigars   Smokeless tobacco: Never  Vaping Use   Vaping Use: Never used  Substance Use Topics   Alcohol use: Yes    Alcohol/week: 2.0 - 3.0 standard drinks    Types: 2 - 3 drink(s) per week   Drug use: No    Allergies as of 08/08/2021   (No Known Allergies)    Review of Systems:    All systems reviewed and negative except where noted in HPI.   Physical Exam:  There were no vitals taken for this visit. No LMP for male patient. Psych:  Alert and cooperative. Normal mood and affect. General:   Alert,  Well-developed, well-nourished, pleasant and cooperative in NAD Head:  Normocephalic and atraumatic. Eyes:  Sclera clear, no icterus.   Conjunctiva pink. Ears:  Normal auditory acuity. Nose:  No deformity, discharge, or lesions. Mouth:  No deformity or lesions,oropharynx pink & moist. Neck:  Supple; no masses or thyromegaly. Lungs:  Respirations even and unlabored.  Clear throughout to auscultation.   No wheezes, crackles, or rhonchi. No acute distress. Heart:  Regular rate and rhythm; no murmurs, clicks, rubs, or gallops. Abdomen:  Normal bowel sounds.  No bruits.  Soft, non-tender and non-distended without masses, hepatosplenomegaly or hernias noted.  No guarding or rebound tenderness.    Msk:  Symmetrical without gross deformities. Good, equal movement & strength bilaterally. Pulses:  Normal pulses noted. Extremities:  No clubbing or edema.  No cyanosis. Neurologic:  Alert and oriented x3;  grossly normal neurologically. Skin:  Intact without significant lesions or rashes. No jaundice. Lymph Nodes:  No significant cervical adenopathy. Psych:  Alert and cooperative. Normal mood and affect.  Imaging Studies: No results found.  Assessment and Plan:   Steven [name] is a 66 y.o. y/o male has been referred for ***  Follow up in  ***  Dr Jonathon Bellows MD,MRCP(U.K)

## 2021-08-09 ENCOUNTER — Other Ambulatory Visit: Payer: Self-pay

## 2021-08-09 ENCOUNTER — Ambulatory Visit
Admission: RE | Admit: 2021-08-09 | Discharge: 2021-08-09 | Disposition: A | Discharge status: Home / Self Care | Payer: 59 | Source: Ambulatory Visit | Attending: Radiation Oncology | Admitting: Radiation Oncology

## 2021-08-09 DIAGNOSIS — C61 Malignant neoplasm of prostate: Secondary | ICD-10-CM | POA: Diagnosis not present

## 2021-08-10 ENCOUNTER — Ambulatory Visit
Admission: RE | Admit: 2021-08-10 | Discharge: 2021-08-10 | Disposition: A | Discharge status: Home / Self Care | Payer: 59 | Source: Ambulatory Visit | Attending: Radiation Oncology | Admitting: Radiation Oncology

## 2021-08-10 ENCOUNTER — Ambulatory Visit: Payer: 59 | Admitting: Surgery

## 2021-08-10 ENCOUNTER — Other Ambulatory Visit: Payer: Self-pay

## 2021-08-10 DIAGNOSIS — C61 Malignant neoplasm of prostate: Secondary | ICD-10-CM | POA: Diagnosis not present

## 2021-08-11 ENCOUNTER — Ambulatory Visit
Admission: RE | Admit: 2021-08-11 | Discharge: 2021-08-11 | Disposition: A | Discharge status: Home / Self Care | Payer: 59 | Source: Ambulatory Visit | Attending: Radiation Oncology | Admitting: Radiation Oncology

## 2021-08-11 DIAGNOSIS — C61 Malignant neoplasm of prostate: Secondary | ICD-10-CM | POA: Diagnosis not present

## 2021-08-12 ENCOUNTER — Ambulatory Visit
Admission: RE | Admit: 2021-08-12 | Discharge: 2021-08-12 | Disposition: A | Discharge status: Home / Self Care | Payer: 59 | Source: Ambulatory Visit | Attending: Radiation Oncology | Admitting: Radiation Oncology

## 2021-08-12 DIAGNOSIS — C61 Malignant neoplasm of prostate: Secondary | ICD-10-CM | POA: Diagnosis not present

## 2021-08-15 ENCOUNTER — Ambulatory Visit: Payer: 59

## 2021-08-16 ENCOUNTER — Other Ambulatory Visit: Payer: Self-pay | Admitting: Cardiovascular Disease

## 2021-08-16 ENCOUNTER — Ambulatory Visit
Admission: RE | Admit: 2021-08-16 | Discharge: 2021-08-16 | Disposition: A | Discharge status: Home / Self Care | Payer: 59 | Source: Ambulatory Visit | Attending: Radiation Oncology | Admitting: Radiation Oncology

## 2021-08-16 ENCOUNTER — Ambulatory Visit (INDEPENDENT_AMBULATORY_CARE_PROVIDER_SITE_OTHER): Payer: 59 | Admitting: *Deleted

## 2021-08-16 ENCOUNTER — Other Ambulatory Visit: Payer: Self-pay

## 2021-08-16 DIAGNOSIS — I513 Intracardiac thrombosis, not elsewhere classified: Secondary | ICD-10-CM

## 2021-08-16 DIAGNOSIS — C61 Malignant neoplasm of prostate: Secondary | ICD-10-CM | POA: Diagnosis not present

## 2021-08-16 DIAGNOSIS — Z5181 Encounter for therapeutic drug level monitoring: Secondary | ICD-10-CM

## 2021-08-16 DIAGNOSIS — Z7901 Long term (current) use of anticoagulants: Secondary | ICD-10-CM

## 2021-08-16 LAB — POCT INR: INR: 1.9 — AB (ref 2.0–3.0)

## 2021-08-16 NOTE — Patient Instructions (Addendum)
Description   Take 1.5 tablets of warfarin today and then START taking warfarin 1 tablet daily except for  1/2 a tablet on Wednesday and Fridays. Recheck INR in 4 weeks- pt request. [facility] [phone]

## 2021-08-16 NOTE — Telephone Encounter (Signed)
Prescription refill request received for warfarin Lov: 05/30/2021 Next INR check: 2/21 Warfarin tablet strength: 5mg 

## 2021-08-17 ENCOUNTER — Ambulatory Visit
Admission: RE | Admit: 2021-08-17 | Discharge: 2021-08-17 | Disposition: A | Discharge status: Home / Self Care | Payer: 59 | Source: Ambulatory Visit | Attending: Radiation Oncology | Admitting: Radiation Oncology

## 2021-08-17 DIAGNOSIS — C61 Malignant neoplasm of prostate: Secondary | ICD-10-CM | POA: Diagnosis not present

## 2021-08-18 ENCOUNTER — Other Ambulatory Visit: Payer: Self-pay

## 2021-08-18 ENCOUNTER — Ambulatory Visit
Admission: RE | Admit: 2021-08-18 | Discharge: 2021-08-18 | Disposition: A | Discharge status: Home / Self Care | Payer: 59 | Source: Ambulatory Visit | Attending: Radiation Oncology | Admitting: Radiation Oncology

## 2021-08-18 DIAGNOSIS — C61 Malignant neoplasm of prostate: Secondary | ICD-10-CM | POA: Diagnosis not present

## 2021-08-19 ENCOUNTER — Ambulatory Visit
Admission: RE | Admit: 2021-08-19 | Discharge: 2021-08-19 | Disposition: A | Discharge status: Home / Self Care | Payer: 59 | Source: Ambulatory Visit | Attending: Radiation Oncology | Admitting: Radiation Oncology

## 2021-08-19 ENCOUNTER — Other Ambulatory Visit: Payer: Self-pay

## 2021-08-19 DIAGNOSIS — C61 Malignant neoplasm of prostate: Secondary | ICD-10-CM | POA: Diagnosis not present

## 2021-08-22 ENCOUNTER — Ambulatory Visit
Admission: RE | Admit: 2021-08-22 | Discharge: 2021-08-22 | Disposition: A | Discharge status: Home / Self Care | Payer: 59 | Source: Ambulatory Visit | Attending: Radiation Oncology | Admitting: Radiation Oncology

## 2021-08-22 ENCOUNTER — Other Ambulatory Visit: Payer: Self-pay

## 2021-08-22 DIAGNOSIS — C61 Malignant neoplasm of prostate: Secondary | ICD-10-CM | POA: Diagnosis not present

## 2021-08-23 ENCOUNTER — Ambulatory Visit (INDEPENDENT_AMBULATORY_CARE_PROVIDER_SITE_OTHER): Payer: 59

## 2021-08-23 ENCOUNTER — Ambulatory Visit
Admission: RE | Admit: 2021-08-23 | Discharge: 2021-08-23 | Disposition: A | Discharge status: Home / Self Care | Payer: 59 | Source: Ambulatory Visit | Attending: Radiation Oncology | Admitting: Radiation Oncology

## 2021-08-23 DIAGNOSIS — C61 Malignant neoplasm of prostate: Secondary | ICD-10-CM | POA: Diagnosis not present

## 2021-08-23 DIAGNOSIS — I255 Ischemic cardiomyopathy: Secondary | ICD-10-CM | POA: Diagnosis not present

## 2021-08-24 ENCOUNTER — Ambulatory Visit
Admission: RE | Admit: 2021-08-24 | Discharge: 2021-08-24 | Disposition: A | Discharge status: Home / Self Care | Payer: 59 | Source: Ambulatory Visit | Attending: Radiation Oncology | Admitting: Radiation Oncology

## 2021-08-24 DIAGNOSIS — C61 Malignant neoplasm of prostate: Secondary | ICD-10-CM | POA: Diagnosis not present

## 2021-08-25 ENCOUNTER — Ambulatory Visit
Admission: RE | Admit: 2021-08-25 | Discharge: 2021-08-25 | Disposition: A | Discharge status: Home / Self Care | Payer: 59 | Source: Ambulatory Visit | Attending: Radiation Oncology | Admitting: Radiation Oncology

## 2021-08-25 DIAGNOSIS — C61 Malignant neoplasm of prostate: Secondary | ICD-10-CM | POA: Diagnosis not present

## 2021-08-25 LAB — CUP PACEART REMOTE DEVICE CHECK
Battery Remaining Longevity: 93 mo
Battery Voltage: 2.99 V
Brady Statistic AP VP Percent: 0.04 %
Brady Statistic AP VS Percent: 75.56 %
Brady Statistic AS VP Percent: 0.01 %
Brady Statistic AS VS Percent: 24.4 %
Brady Statistic RA Percent Paced: 75.14 %
Brady Statistic RV Percent Paced: 0.05 %
Date Time Interrogation Session: 20230301183527
HighPow Impedance: 47 Ohm
HighPow Impedance: 76 Ohm
Implantable Lead Implant Date: 20120906
Implantable Lead Implant Date: 20120906
Implantable Lead Location: 753859
Implantable Lead Location: 753860
Implantable Lead Model: 185
Implantable Lead Model: 5076
Implantable Lead Serial Number: 358872
Implantable Pulse Generator Implant Date: 20200831
Lead Channel Impedance Value: 380 Ohm
Lead Channel Impedance Value: 380 Ohm
Lead Channel Impedance Value: 399 Ohm
Lead Channel Pacing Threshold Amplitude: 0.5 V
Lead Channel Pacing Threshold Amplitude: 0.75 V
Lead Channel Pacing Threshold Pulse Width: 0.4 ms
Lead Channel Pacing Threshold Pulse Width: 0.4 ms
Lead Channel Sensing Intrinsic Amplitude: 1.375 mV
Lead Channel Sensing Intrinsic Amplitude: 1.375 mV
Lead Channel Sensing Intrinsic Amplitude: 4.125 mV
Lead Channel Sensing Intrinsic Amplitude: 4.125 mV
Lead Channel Setting Pacing Amplitude: 1.5 V
Lead Channel Setting Pacing Amplitude: 2.5 V
Lead Channel Setting Pacing Pulse Width: 0.4 ms
Lead Channel Setting Sensing Sensitivity: 0.3 mV

## 2021-08-26 ENCOUNTER — Ambulatory Visit
Admission: RE | Admit: 2021-08-26 | Discharge: 2021-08-26 | Disposition: A | Discharge status: Home / Self Care | Payer: 59 | Source: Ambulatory Visit | Attending: Radiation Oncology | Admitting: Radiation Oncology

## 2021-08-26 DIAGNOSIS — C61 Malignant neoplasm of prostate: Secondary | ICD-10-CM | POA: Diagnosis not present

## 2021-08-29 ENCOUNTER — Ambulatory Visit
Admission: RE | Admit: 2021-08-29 | Discharge: 2021-08-29 | Disposition: A | Discharge status: Home / Self Care | Payer: 59 | Source: Ambulatory Visit | Attending: Radiation Oncology | Admitting: Radiation Oncology

## 2021-08-29 ENCOUNTER — Other Ambulatory Visit: Payer: Self-pay

## 2021-08-29 DIAGNOSIS — C61 Malignant neoplasm of prostate: Secondary | ICD-10-CM | POA: Diagnosis not present

## 2021-08-30 ENCOUNTER — Ambulatory Visit
Admission: RE | Admit: 2021-08-30 | Discharge: 2021-08-30 | Disposition: A | Discharge status: Home / Self Care | Payer: 59 | Source: Ambulatory Visit | Attending: Radiation Oncology | Admitting: Radiation Oncology

## 2021-08-30 ENCOUNTER — Other Ambulatory Visit: Payer: Self-pay

## 2021-08-30 DIAGNOSIS — C61 Malignant neoplasm of prostate: Secondary | ICD-10-CM | POA: Diagnosis not present

## 2021-08-30 NOTE — Progress Notes (Signed)
Remote ICD transmission.   

## 2021-08-31 ENCOUNTER — Ambulatory Visit: Payer: 59

## 2021-09-01 ENCOUNTER — Ambulatory Visit
Admission: RE | Admit: 2021-09-01 | Discharge: 2021-09-01 | Disposition: A | Discharge status: Home / Self Care | Payer: 59 | Source: Ambulatory Visit | Attending: Radiation Oncology | Admitting: Radiation Oncology

## 2021-09-01 DIAGNOSIS — C61 Malignant neoplasm of prostate: Secondary | ICD-10-CM | POA: Diagnosis not present

## 2021-09-02 ENCOUNTER — Ambulatory Visit
Admission: RE | Admit: 2021-09-02 | Discharge: 2021-09-02 | Disposition: A | Discharge status: Home / Self Care | Payer: 59 | Source: Ambulatory Visit | Attending: Radiation Oncology | Admitting: Radiation Oncology

## 2021-09-02 DIAGNOSIS — C61 Malignant neoplasm of prostate: Secondary | ICD-10-CM | POA: Diagnosis not present

## 2021-09-05 ENCOUNTER — Other Ambulatory Visit: Payer: Self-pay

## 2021-09-05 ENCOUNTER — Ambulatory Visit
Admission: RE | Admit: 2021-09-05 | Discharge: 2021-09-05 | Disposition: A | Discharge status: Home / Self Care | Payer: 59 | Source: Ambulatory Visit | Attending: Radiation Oncology | Admitting: Radiation Oncology

## 2021-09-05 DIAGNOSIS — C61 Malignant neoplasm of prostate: Secondary | ICD-10-CM | POA: Diagnosis not present

## 2021-09-06 ENCOUNTER — Other Ambulatory Visit: Payer: Self-pay

## 2021-09-06 ENCOUNTER — Ambulatory Visit
Admission: RE | Admit: 2021-09-06 | Discharge: 2021-09-06 | Disposition: A | Discharge status: Home / Self Care | Payer: 59 | Source: Ambulatory Visit | Attending: Radiation Oncology | Admitting: Radiation Oncology

## 2021-09-06 DIAGNOSIS — C61 Malignant neoplasm of prostate: Secondary | ICD-10-CM | POA: Diagnosis not present

## 2021-09-07 ENCOUNTER — Ambulatory Visit
Admission: RE | Admit: 2021-09-07 | Discharge: 2021-09-07 | Disposition: A | Discharge status: Home / Self Care | Payer: 59 | Source: Ambulatory Visit | Attending: Radiation Oncology | Admitting: Radiation Oncology

## 2021-09-07 DIAGNOSIS — C61 Malignant neoplasm of prostate: Secondary | ICD-10-CM | POA: Diagnosis not present

## 2021-09-08 ENCOUNTER — Ambulatory Visit
Admission: RE | Admit: 2021-09-08 | Discharge: 2021-09-08 | Disposition: A | Discharge status: Home / Self Care | Payer: 59 | Source: Ambulatory Visit | Attending: Radiation Oncology | Admitting: Radiation Oncology

## 2021-09-08 DIAGNOSIS — C61 Malignant neoplasm of prostate: Secondary | ICD-10-CM | POA: Diagnosis not present

## 2021-09-09 ENCOUNTER — Other Ambulatory Visit: Payer: Self-pay

## 2021-09-09 ENCOUNTER — Ambulatory Visit
Admission: RE | Admit: 2021-09-09 | Discharge: 2021-09-09 | Disposition: A | Discharge status: Home / Self Care | Payer: 59 | Source: Ambulatory Visit | Attending: Radiation Oncology | Admitting: Radiation Oncology

## 2021-09-09 DIAGNOSIS — C61 Malignant neoplasm of prostate: Secondary | ICD-10-CM | POA: Diagnosis not present

## 2021-09-12 ENCOUNTER — Other Ambulatory Visit: Payer: Self-pay

## 2021-09-12 ENCOUNTER — Ambulatory Visit
Admission: RE | Admit: 2021-09-12 | Discharge: 2021-09-12 | Disposition: A | Discharge status: Home / Self Care | Payer: 59 | Source: Ambulatory Visit | Attending: Radiation Oncology | Admitting: Radiation Oncology

## 2021-09-12 ENCOUNTER — Ambulatory Visit: Payer: 59

## 2021-09-12 DIAGNOSIS — C61 Malignant neoplasm of prostate: Secondary | ICD-10-CM | POA: Diagnosis not present

## 2021-09-13 ENCOUNTER — Ambulatory Visit: Payer: 59

## 2021-09-13 ENCOUNTER — Ambulatory Visit (INDEPENDENT_AMBULATORY_CARE_PROVIDER_SITE_OTHER): Payer: 59 | Admitting: Pharmacist Clinician (PhC)/ Clinical Pharmacy Specialist

## 2021-09-13 DIAGNOSIS — I4891 Unspecified atrial fibrillation: Secondary | ICD-10-CM | POA: Diagnosis not present

## 2021-09-13 DIAGNOSIS — Z7901 Long term (current) use of anticoagulants: Secondary | ICD-10-CM

## 2021-09-13 DIAGNOSIS — I513 Intracardiac thrombosis, not elsewhere classified: Secondary | ICD-10-CM | POA: Diagnosis not present

## 2021-09-13 DIAGNOSIS — C61 Malignant neoplasm of prostate: Secondary | ICD-10-CM | POA: Diagnosis not present

## 2021-09-13 LAB — POCT INR: INR: 2.1 (ref 2.0–3.0)

## 2021-09-14 ENCOUNTER — Other Ambulatory Visit: Payer: Self-pay

## 2021-09-14 ENCOUNTER — Encounter: Payer: Self-pay | Admitting: Urology

## 2021-09-14 ENCOUNTER — Ambulatory Visit: Payer: 59

## 2021-09-14 DIAGNOSIS — C61 Malignant neoplasm of prostate: Secondary | ICD-10-CM | POA: Diagnosis not present

## 2021-10-11 ENCOUNTER — Other Ambulatory Visit: Payer: Self-pay | Admitting: Cardiovascular Disease

## 2021-10-13 ENCOUNTER — Ambulatory Visit (INDEPENDENT_AMBULATORY_CARE_PROVIDER_SITE_OTHER): Payer: 59

## 2021-10-13 DIAGNOSIS — Z7901 Long term (current) use of anticoagulants: Secondary | ICD-10-CM

## 2021-10-13 DIAGNOSIS — Z5181 Encounter for therapeutic drug level monitoring: Secondary | ICD-10-CM | POA: Diagnosis not present

## 2021-10-13 DIAGNOSIS — I513 Intracardiac thrombosis, not elsewhere classified: Secondary | ICD-10-CM

## 2021-10-13 LAB — POCT INR: INR: 1.8 — AB (ref 2.0–3.0)

## 2021-10-13 NOTE — Patient Instructions (Signed)
TAKE 2 TABLETS TODAY ONLY and then Continue taking warfarin 1 tablet daily except for 1/2 a tablet on Wednesday and Fridays. Recheck INR in 4 weeks- pt request. [facility]in Clinic [phone] ?

## 2021-10-15 IMAGING — CT NM PET TUM IMG SKULL BASE T - THIGH
7 series · 25 of 25 positions shown · non-contrast
Comparison: None

CLINICAL DATA: A 65-year-old male with prostate cancer and a PSA of
13.6.

EXAM:
NUCLEAR MEDICINE PET SKULL BASE TO THIGH
TECHNIQUE: 9.08 mCi F18 Piflufolastat (Pylarify) was injected intravenously.
Full-ring PET imaging was performed from the skull base to thigh
after the radiotracer. CT data was obtained and used for attenuation
correction and anatomic localization.

[Series 3: pet sk_thigh ac · axial · 5.0mm · 4.07mm/px · z∈[-522,+482]mm · 5 of 252 slices shown]
[im 1/252]
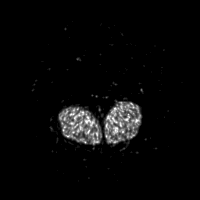
[im 63/252]
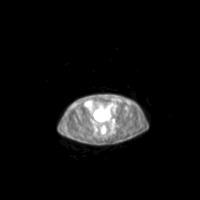
[im 126/252]
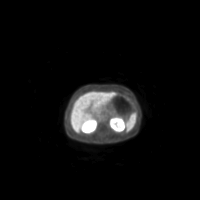
[im 189/252]
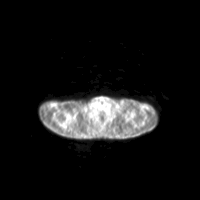
[im 252/252]
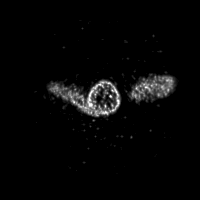

[Series 4: ct sk_thigh 5.0 hd_fov · axial · 5.0mm · 1.52mm/px · z∈[-522,+482]mm · 6 of 252 slices shown]
[im 1/252]
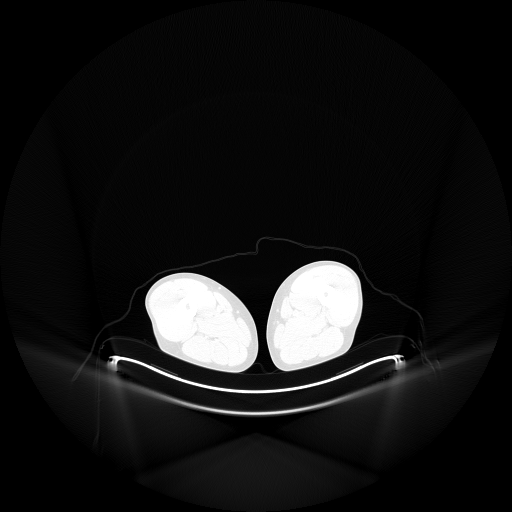
[im 51/252]
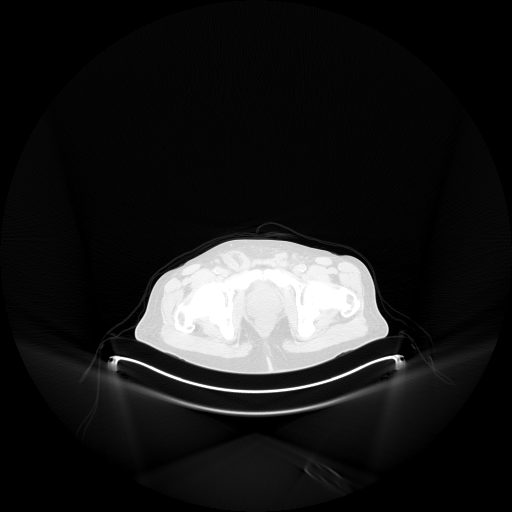
[im 101/252]
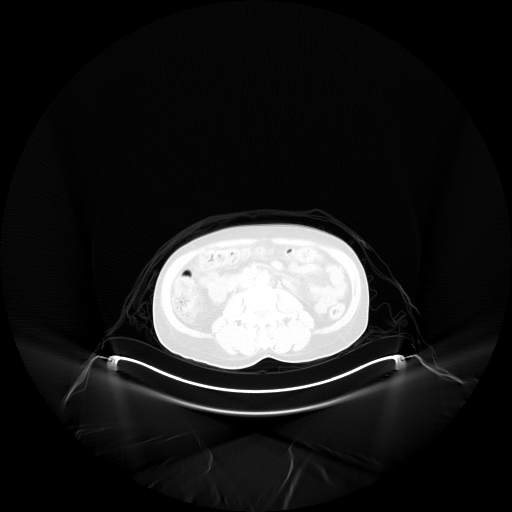
[im 151/252]
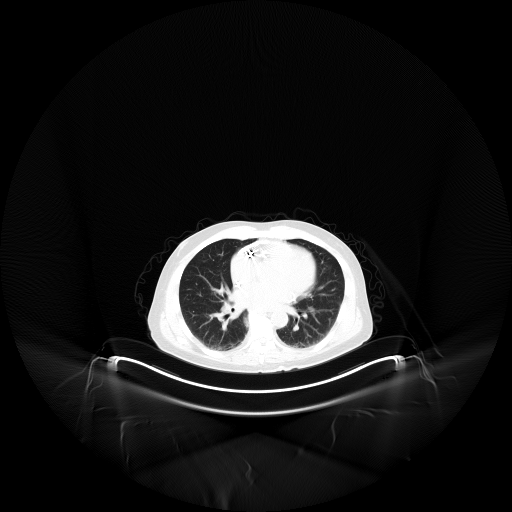
[im 201/252]
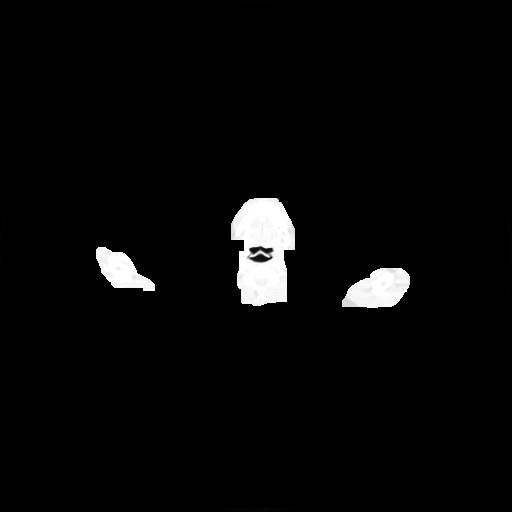
[im 252/252]
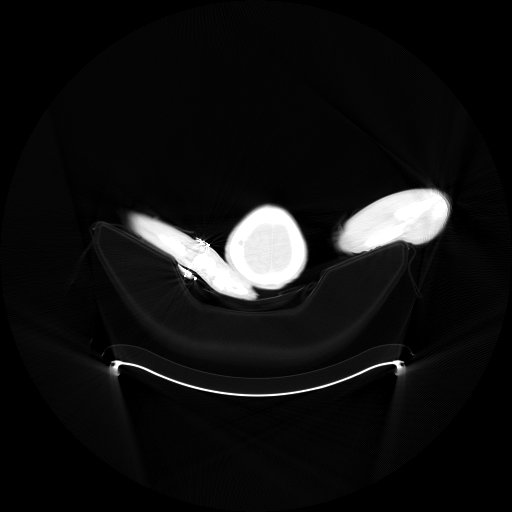

[Series 5: pet sk_thigh nac · axial · 5.0mm · 4.07mm/px · z∈[-522,+482]mm · 6 of 252 slices shown]
[im 1/252]
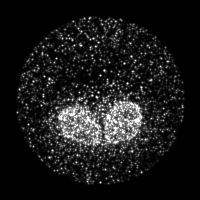
[im 51/252]
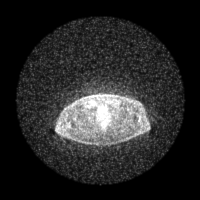
[im 101/252]
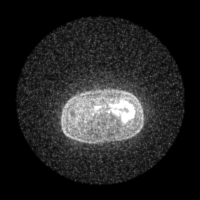
[im 151/252]
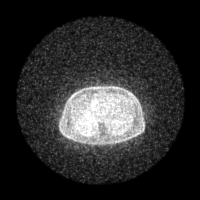
[im 201/252]
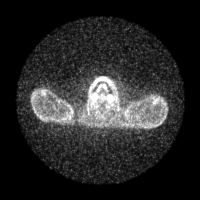
[im 252/252]
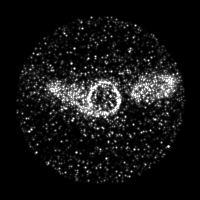

[Series 8: ct sk_thigh 5.0 br59 lung_bone · axial · 5.0mm · 0.66mm/px · 1 of 65 slices shown]
[im 1/65]
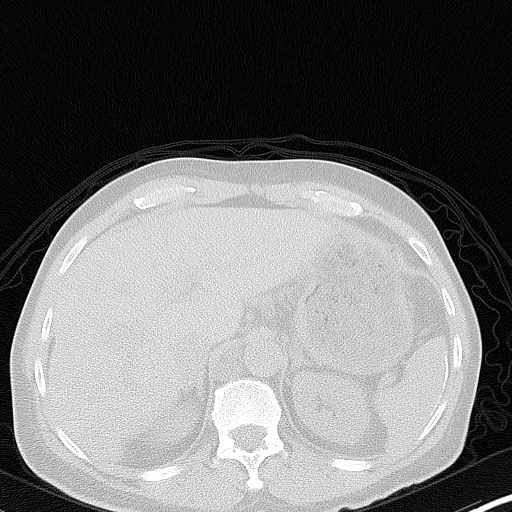

[Series 603: fused cor · 1 of 49 slices shown]
[im 1/49]
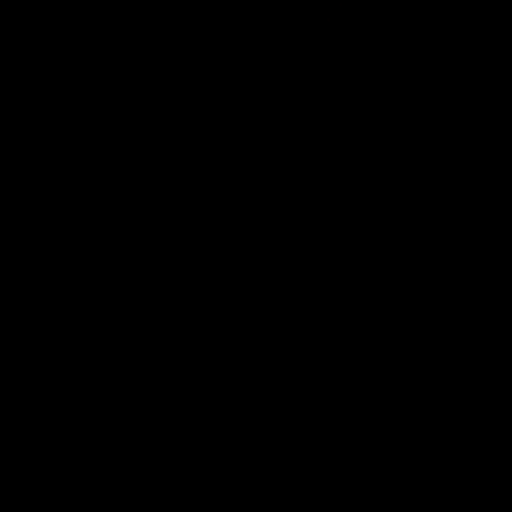

[Series 604: <mip collection> · coronal · 2.08mm/px · 1 of 32 slices shown]
[im 1/32]
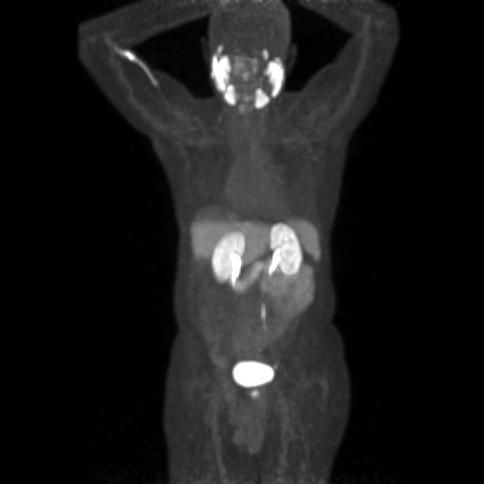

[Series 605: range-ct sk_thigh 5.0 hd_fov-tra-<alpha range> · 5 of 232 slices shown]
[im 1/232]
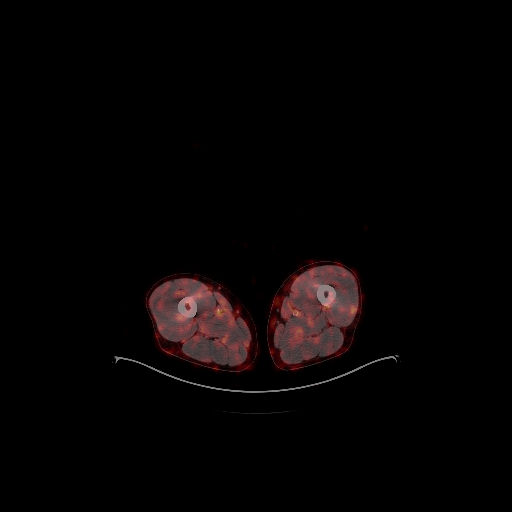
[im 58/232]
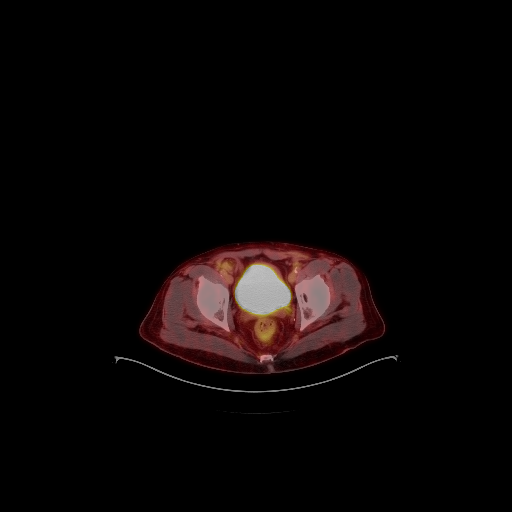
[im 116/232]
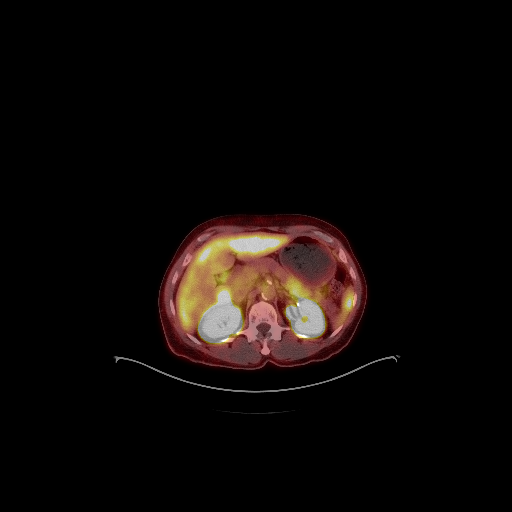
[im 174/232]
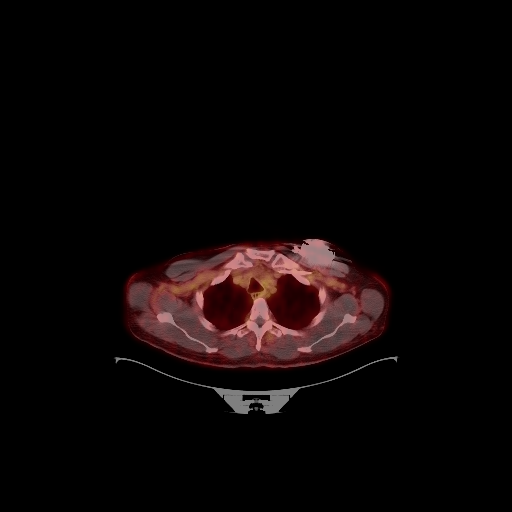
[im 232/232]
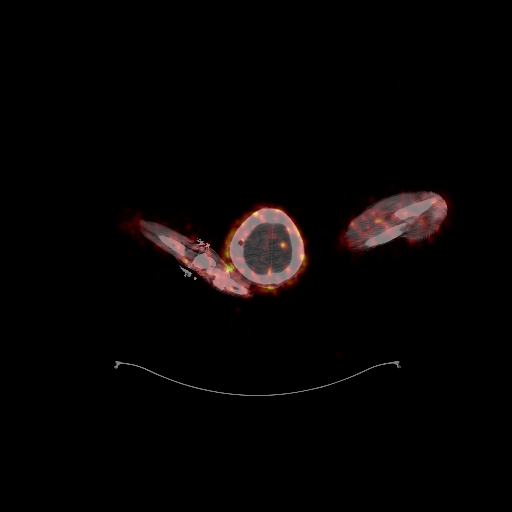

[25 of 25 positions shown; findings below may reference images not displayed]

FINDINGS: NECK

No radiotracer activity in neck lymph nodes.

Incidental CT finding: None

CHEST

No radiotracer accumulation within mediastinal or hilar lymph nodes.
No suspicious pulmonary nodules on the CT scan.

Incidental CT finding: Signs of pulmonary emphysema. Basilar
atelectasis. No effusion. No consolidative changes. Airways are
patent.

Multi lead pacer defibrillator in place. Extensive pericardial
calcifications. Aortic atherosclerosis. Normal caliber of central
pulmonary vessels. No adenopathy by size criteria in the axillary
regions or in the thoracic inlet. There is however a high LEFT
paratracheal area that is suspicious for a lymph node (image 73/4)
20 x 18 mm. This does not display evidence of radiotracer
accumulation on the current study.

Low-density lobulated area in the RIGHT retrocrural region with
density near water, suggestion of serpiginous morphology (image
125/4) no associated FDG uptake measuring 13 mm short axis.

ABDOMEN/PELVIS

Prostate: Marked radiotracer accumulation in the posterior prostate
(image 206/4) no visible correlate on CT with a maximum SUV of 13.8.

Lymph nodes: No abnormal radiotracer accumulation within pelvic or
abdominal nodes.

Liver: No evidence of liver metastasis

Incidental CT finding: No acute findings relative liver, pancreas,
spleen, adrenal glands or kidneys. A RIGHT inguinal hernia is
moderate-sized and contains bowel loops. There is also a small RIGHT
hydrocele. Bowel loops extend to the level of the inguinal canal
just above the scrotum. Appendix is normal. No adenopathy by size
criteria in the abdomen. Aortic atherosclerosis without aneurysm.

SKELETON

No focal  activity to suggest skeletal metastasis.
IMPRESSION: Marked increased radiotracer activity in the prostate bed in this
patient with known prostate cancer. No signs of disease elsewhere in
the neck, chest, abdomen or pelvis.

Suspected adenopathy without accumulation of PSMA target radiotracer
in the chest versus is inferior extension of the thyroid with
greater than 2 cm thyroid lesion. Findings are indeterminate on
current study thyroid neoplasm or adenopathy from other cause is
considered. Would suggest contrasted CT for further evaluation based
on the location of this abnormality.

Lobular low-density area in the RIGHT retrocrural region is favored
to represent the cisterna chyli, attention to this area on
subsequent imaging is suggested.

RIGHT inguinal hernia containing small bowel. Correlate with any
symptoms in this area, no sign of obstruction.

Signs of cardiac disease with pericardial calcification and
calcification of coronary vasculature.

Aortic Atherosclerosis (G61G0-ITD.D) and Emphysema (G61G0-H6T.X).

These results will be called to the ordering clinician or
representative by the Radiologist Assistant, and communication
documented in the PACS or [REDACTED].

## 2021-10-19 ENCOUNTER — Telehealth: Payer: Self-pay

## 2021-10-19 NOTE — Telephone Encounter (Signed)
Telephone appointment reminder. I left a voicemail and reminded patient of his 8:30am-10/20/21 telephone appointment w/ Ashlyn Bruning PA-C. I left my extension [phone] and requested that patient return my call prior to appointment time, so that I may complete the nursing portion of this appointment. ?

## 2021-10-20 ENCOUNTER — Telehealth: Payer: Self-pay

## 2021-10-20 ENCOUNTER — Ambulatory Visit: Payer: 59 | Attending: Radiation Oncology | Admitting: Urology

## 2021-10-20 DIAGNOSIS — C61 Malignant neoplasm of prostate: Secondary | ICD-10-CM | POA: Insufficient documentation

## 2021-10-20 NOTE — Telephone Encounter (Signed)
Telephone appointment reminder x2. I left a another voicemail and reminded patient of his 8:30am-10/20/21 telephone appointment w/ Ashlyn Bruning PA-C. I left my extension [phone] and requested that patient return my call prior to appointment time, so that I may complete the nursing portion of this appointment. ?

## 2021-10-20 NOTE — Telephone Encounter (Signed)
Post calling all numbers on file for patient, I was unable to reach him to conduct the nursing portion of his 4:00pm-10/20/21 telephone appointment. Previous messages have been left on patient's voicemail w/ no response. ?

## 2021-10-27 ENCOUNTER — Other Ambulatory Visit: Payer: Self-pay | Admitting: Cardiovascular Disease

## 2021-10-28 ENCOUNTER — Other Ambulatory Visit: Payer: Self-pay | Admitting: Cardiovascular Disease

## 2021-10-28 ENCOUNTER — Telehealth: Payer: Self-pay | Admitting: Cardiovascular Disease

## 2021-10-28 NOTE — Telephone Encounter (Signed)
Patient states he is returning a call to someone this morning about a refill. ?

## 2021-10-31 NOTE — Telephone Encounter (Signed)
Was unable to reach patient.

## 2021-11-03 ENCOUNTER — Encounter: Payer: Self-pay | Admitting: Urology

## 2021-11-03 NOTE — Progress Notes (Signed)
Telephone appointment. I verified patient identity and began nursing interview. Patient reports polyuria, and diarrhea w/ mild fatigue. No other issues reported at this time. ? ?Meaningful use complete. ?I-PSS score of 6 (mild). ?Flomax 0.'4mg'$  as directed. ?Urology appointment- June 7th, 2023 ? ?Patient aware of his 10:30am-11/04/21 telephone appointment w/ Ashlyn Bruning PA-C. I left my extension [phone] in case patient needs anything. ? ?Patient contact [phone] ?

## 2021-11-04 ENCOUNTER — Ambulatory Visit
Admission: RE | Admit: 2021-11-04 | Discharge: 2021-11-04 | Disposition: A | Discharge status: Home / Self Care | Payer: 59 | Source: Ambulatory Visit | Attending: Radiation Oncology | Admitting: Radiation Oncology

## 2021-11-04 DIAGNOSIS — C61 Malignant neoplasm of prostate: Secondary | ICD-10-CM | POA: Insufficient documentation

## 2021-11-04 MED ORDER — ENTRESTO 24-26 MG PO TABS: 1.0000 | ORAL_TABLET | Freq: Two times a day (BID) | ORAL | 1 refills | Status: DC

## 2021-11-04 NOTE — Progress Notes (Signed)
?Radiation Oncology         (336) [phone] ?________________________________ ? ?Name: Steven [name] MRN: [id number]  ?Date: 11/04/2021  DOB: [birth year] ? ?Post Treatment Note ? ?CC: Ziglar, Lincoln Brigham, MD  Festus Aloe, MD ? ?Diagnosis:   66 y.o. 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 ?Interval Since Last Radiation:  7 weeks  ?08/04/21 - 09/14/21:  The prostate was treated to 70 Gy in 28 fractions of 2.5 Gy ? ?Narrative:  I spoke with the patient to conduct his routine scheduled 1 month follow up visit via telephone to spare the patient unnecessary potential exposure in the healthcare setting during the current COVID-19 pandemic.  The patient was notified in advance and gave permission to proceed with this visit format. ? ?He tolerated radiation treatment relatively well with only minor urinary irritation and modest fatigue.  He did experience some increased frequency, urgency and nocturia 3-4 times per night despite taking Flomax daily as prescribed.  He also reported mild nausea and diarrhea which was improved with Imodium as needed.                             ? ?On review of systems, the patient states that he is doing well in general.  He continues with increased frequency, urgency and nocturia at least 3 times per night despite taking the Flomax daily as prescribed.  He denies dysuria, gross hematuria, straining to void or incomplete bladder emptying.  His bowel issues have fully resolved and back to normal at this point.  He did not notice any significant impact on his energy level and overall, is pleased with his progress to date. ? ?ALLERGIES:  has No Known Allergies. ? ?Meds: ?Current Outpatient Medications  ?Medication Sig Dispense Refill  ? amLODipine (NORVASC) 2.5 MG tablet Take 2.5 mg by mouth daily.    ? atorvastatin (LIPITOR) 80 MG tablet Take 80 mg by mouth daily.    ? carvedilol (COREG) 25 MG tablet TAKE 1 TABLET BY MOUTH TWICE DAILY  WITH A MEAL (Patient taking differently: Take 25 mg by mouth 2 (two) times daily with a meal.) 180 tablet 3  ? Cholecalciferol (VITAMIN D) 125 MCG (5000 UT) CAPS Take 5,000 Units by mouth daily.    ? dapagliflozin propanediol (FARXIGA) 10 MG TABS tablet Take 10 mg by mouth daily.    ? furosemide (LASIX) 20 MG tablet Take 1 tablet (20 mg total) by mouth daily. Schedule an appointment for further refills, 2nd attempt 30 tablet 0  ? isosorbide mononitrate (IMDUR) 120 MG 24 hr tablet Take 1 tablet (120 mg total) by mouth daily. 90 tablet 3  ? Multiple Vitamin (MULTIVITAMIN) tablet Take 1 tablet by mouth daily.    ? nitroGLYCERIN (NITROSTAT) 0.4 MG SL tablet Place 1 tablet (0.4 mg total) under the tongue every 5 (five) minutes as needed. Usual dose is q 5 minutes x 3 doses. 75 tablet 0  ? sacubitril-valsartan (ENTRESTO) 24-26 MG Take 1 tablet by mouth 2 (two) times daily. 180 tablet 1  ? spironolactone (ALDACTONE) 25 MG tablet Take 1 tablet (25 mg total) by mouth daily. Schedule an appointment for further refills, 2nd attempt 30 tablet 0  ? tamsulosin (FLOMAX) 0.4 MG CAPS capsule Take 0.4 mg by mouth at bedtime.    ? warfarin (COUMADIN) 5 MG tablet Take 1/2 a tablet to 1 tablet by mouth  daily as directed by the coumadin clinic. 80 tablet 0  ? ?No current facility-administered medications for this encounter.  ? ? ?Physical Findings: ? vitals were not taken for this visit.  ?Pain Assessment ?Pain Score: 0-No pain/10 ?Unable to assess due to telephone follow-up visit format. ? ?Lab Findings: ?Lab Results  ?Component Value Date  ? WBC 5.1 07/20/2021  ? HGB 13.7 07/20/2021  ? HCT 40.7 07/20/2021  ? MCV 97.6 07/20/2021  ? PLT 170 07/20/2021  ? ? ? ?Radiographic Findings: ?No results found. ? ?Impression/Plan: ?1. 66 y.o. gentleman with stage T1c adenocarcinoma of the prostate with a Gleason's score of 4+3 and a PSA of 13.6. ?He will continue to follow up with urology for ongoing PSA determinations and has an appointment  scheduled with Dr. Junious Silk on 11/30/2021 but is planning to call to have this changed to a Friday appointment since he is not currently working on Fridays. He understands what to expect with regards to PSA monitoring going forward.  Regarding the LUTS, I offered a prescription for an anticholinergic to help manage the frequency and urgency but he declined as he currently feels like his symptoms are tolerable and he really does not want to take another medication unless absolutely necessary.  I will look forward to following his response to treatment via correspondence with urology, and would be happy to continue to participate in his care if clinically indicated. I talked to the patient about what to expect in the future, including his risk for erectile dysfunction and rectal bleeding. I encouraged him to call or return to the office if he has any questions regarding his previous radiation or possible radiation side effects. He was comfortable with this plan and will follow up as needed.  ? ? ? ?Nicholos Johns, PA-C  ?

## 2021-11-04 NOTE — Telephone Encounter (Signed)
Patient is returning call.  °

## 2021-11-04 NOTE — Telephone Encounter (Signed)
Refill sent to pharmacy. Patient not scheduled to see Dr Sallyanne Kuster for a year from 05/30/21. And has an appt on 11/11/21. ?

## 2021-11-04 NOTE — Progress Notes (Signed)
?  Radiation Oncology         (336) [phone] ?________________________________ ? ?Name: Steven [name] MRN: [id number]  ?Date: 09/14/2021  DOB: [birth year] ? ?End of Treatment Note ? ?Diagnosis:   66 y.o. gentleman with stage T1c adenocarcinoma of the prostate with a Gleason's score of 4+3 and a PSA of 13.6    ? ?Indication for treatment:  Curative, Definitive Radiotherapy      ? ?Radiation treatment dates:   08/04/21 - 09/14/21 ? ?Site/dose:   The prostate was treated to 70 Gy in 28 fractions of 2.5 Gy ? ?Beams/energy:   The patient was treated with IMRT using volumetric arc therapy delivering 6 MV X-rays to clockwise and counterclockwise circumferential arcs with a 90 degree collimator offset to avoid dose scalloping.  Image guidance was performed with daily cone beam CT prior to each fraction to align to gold markers in the prostate and assure proper bladder and rectal fill volumes.  Immobilization was achieved with Body[employer] custom mold. ? ?Narrative: The patient tolerated radiation treatment relatively well with only minor urinary irritation and modest fatigue.  He did experience some increased frequency, urgency and nocturia 3-4 times per night despite taking Flomax daily as prescribed.  He also reported mild nausea and diarrhea which was improved with Imodium as needed. ? ?Plan: The patient has completed radiation treatment. He will return to radiation oncology clinic for routine followup in one month. I advised him to call or return sooner if he has any questions or concerns related to his recovery or treatment. ?________________________________ ? ?Sheral Apley Tammi Klippel, M.D.  ?

## 2021-11-07 ENCOUNTER — Other Ambulatory Visit: Payer: Self-pay | Admitting: Cardiovascular Disease

## 2021-11-11 ENCOUNTER — Ambulatory Visit (INDEPENDENT_AMBULATORY_CARE_PROVIDER_SITE_OTHER): Payer: 59

## 2021-11-11 DIAGNOSIS — Z7901 Long term (current) use of anticoagulants: Secondary | ICD-10-CM | POA: Diagnosis not present

## 2021-11-11 DIAGNOSIS — I513 Intracardiac thrombosis, not elsewhere classified: Secondary | ICD-10-CM | POA: Diagnosis not present

## 2021-11-11 DIAGNOSIS — Z5181 Encounter for therapeutic drug level monitoring: Secondary | ICD-10-CM

## 2021-11-11 LAB — POCT INR: INR: 2.9 (ref 2.0–3.0)

## 2021-11-11 NOTE — Patient Instructions (Signed)
Continue taking warfarin 1 tablet daily except for 1/2 a tablet on Wednesday and Fridays. Recheck INR in 6 weeks. Coumadin Clinic [phone]

## 2021-11-17 ENCOUNTER — Other Ambulatory Visit: Payer: Self-pay | Admitting: Cardiovascular Disease

## 2021-11-22 ENCOUNTER — Ambulatory Visit (INDEPENDENT_AMBULATORY_CARE_PROVIDER_SITE_OTHER): Payer: 59

## 2021-11-22 DIAGNOSIS — I255 Ischemic cardiomyopathy: Secondary | ICD-10-CM

## 2021-11-23 ENCOUNTER — Other Ambulatory Visit: Payer: Self-pay | Admitting: Cardiovascular Disease

## 2021-11-23 LAB — CUP PACEART REMOTE DEVICE CHECK
Battery Remaining Longevity: 90 mo
Battery Voltage: 3.01 V
Brady Statistic AP VP Percent: 0.04 %
Brady Statistic AP VS Percent: 84.47 %
Brady Statistic AS VP Percent: 0.01 %
Brady Statistic AS VS Percent: 15.48 %
Brady Statistic RA Percent Paced: 83.81 %
Brady Statistic RV Percent Paced: 0.05 %
Date Time Interrogation Session: 20230531001604
HighPow Impedance: 45 Ohm
HighPow Impedance: 64 Ohm
Implantable Lead Implant Date: 20120906
Implantable Lead Implant Date: 20120906
Implantable Lead Location: 753859
Implantable Lead Location: 753860
Implantable Lead Model: 185
Implantable Lead Model: 5076
Implantable Lead Serial Number: 358872
Implantable Pulse Generator Implant Date: 20200831
Lead Channel Impedance Value: 323 Ohm
Lead Channel Impedance Value: 342 Ohm
Lead Channel Impedance Value: 380 Ohm
Lead Channel Pacing Threshold Amplitude: 0.5 V
Lead Channel Pacing Threshold Amplitude: 0.75 V
Lead Channel Pacing Threshold Pulse Width: 0.4 ms
Lead Channel Pacing Threshold Pulse Width: 0.4 ms
Lead Channel Sensing Intrinsic Amplitude: 2 mV
Lead Channel Sensing Intrinsic Amplitude: 2 mV
Lead Channel Sensing Intrinsic Amplitude: 4.625 mV
Lead Channel Sensing Intrinsic Amplitude: 4.625 mV
Lead Channel Setting Pacing Amplitude: 1.5 V
Lead Channel Setting Pacing Amplitude: 2.5 V
Lead Channel Setting Pacing Pulse Width: 0.4 ms
Lead Channel Setting Sensing Sensitivity: 0.3 mV

## 2021-11-27 IMAGING — US US THYROID
1 series · 15 of 25 positions shown · non-contrast
Comparison: CT neck March 2021, PET-CT March 2021

CLINICAL DATA: Incidental on other study.

EXAM:
THYROID ULTRASOUND
TECHNIQUE: Ultrasound examination of the thyroid gland and adjacent soft
tissues was performed.

[Series 1: us thyroid mc & wl · 62 acquisitions, 15 frames shown]
[im 1/62]
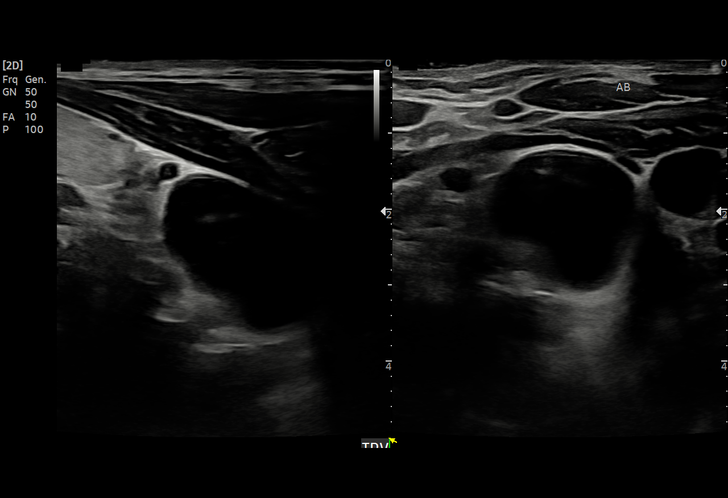
[im 6/62]
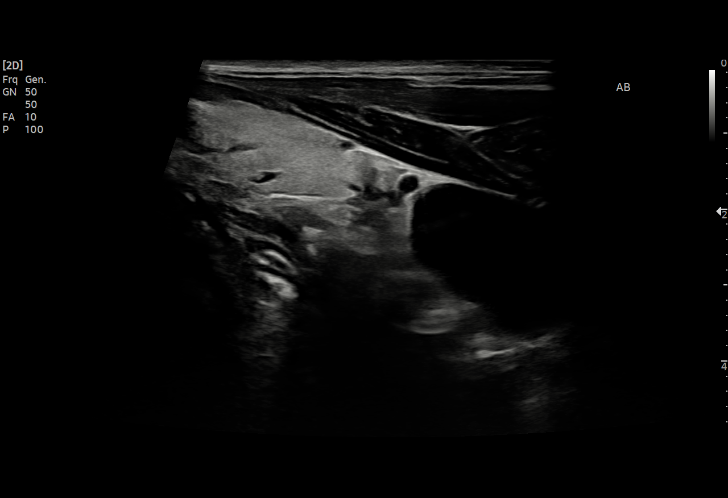
[im 11/62]
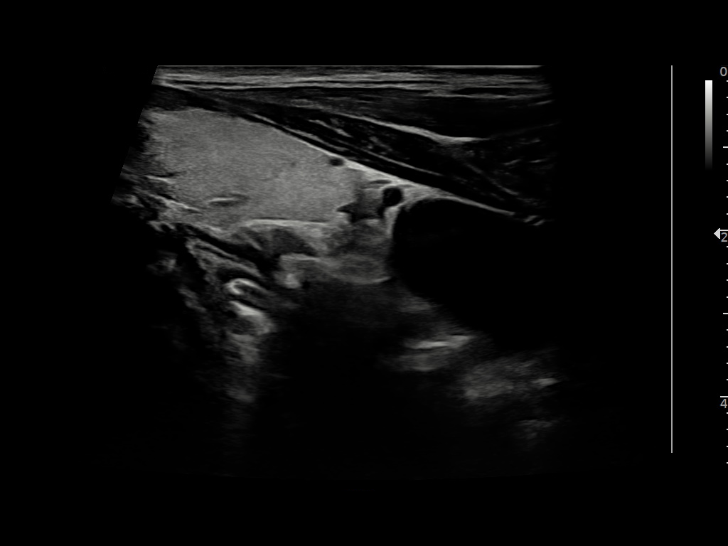
[im 13/62]
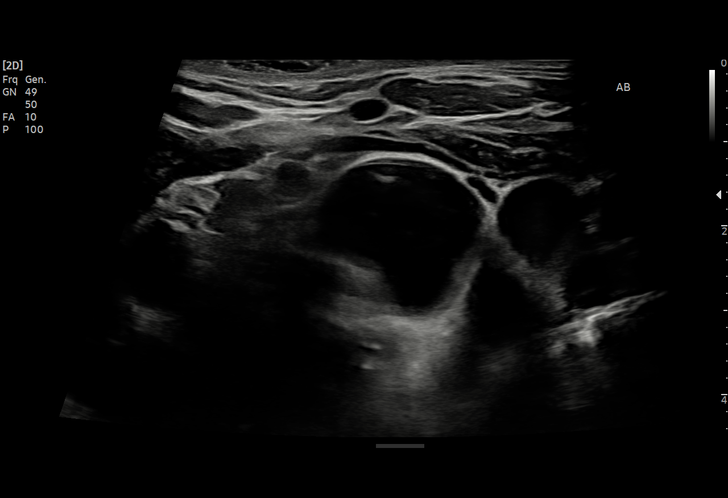
[im 18/62]
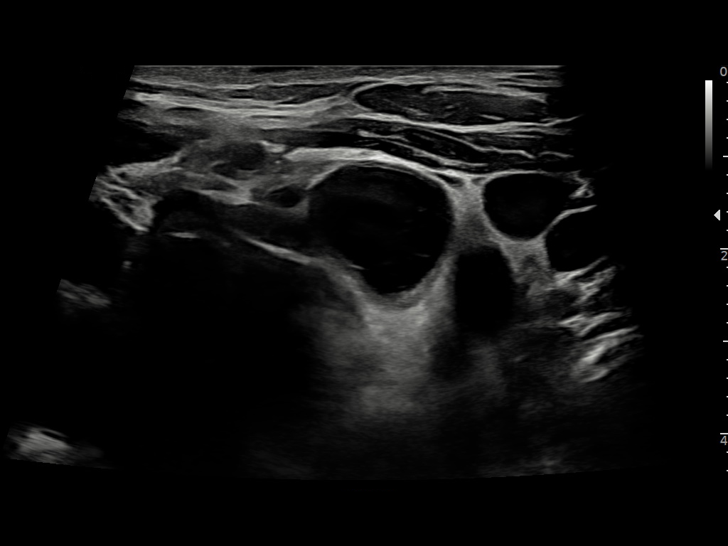
[im 23/62]
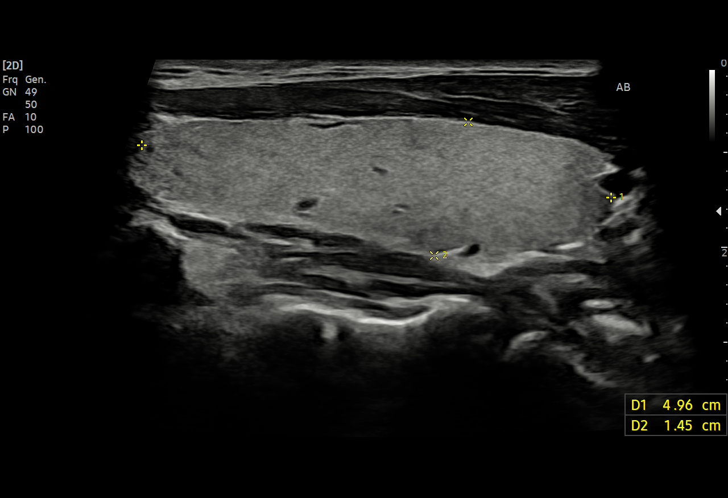
[im 26/62]
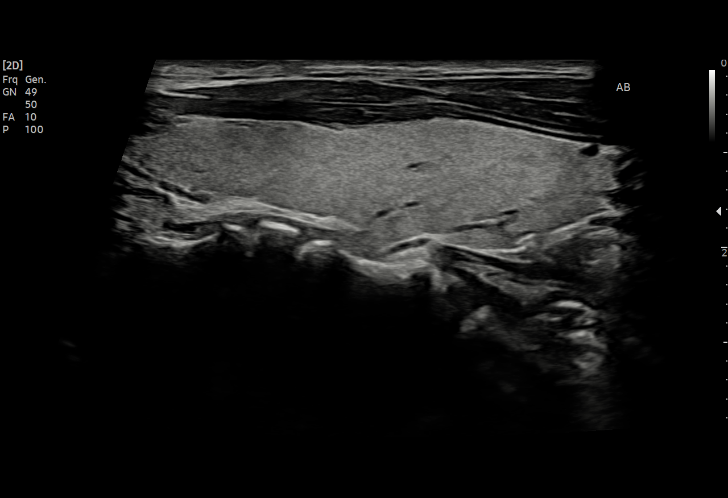
[im 31/62]
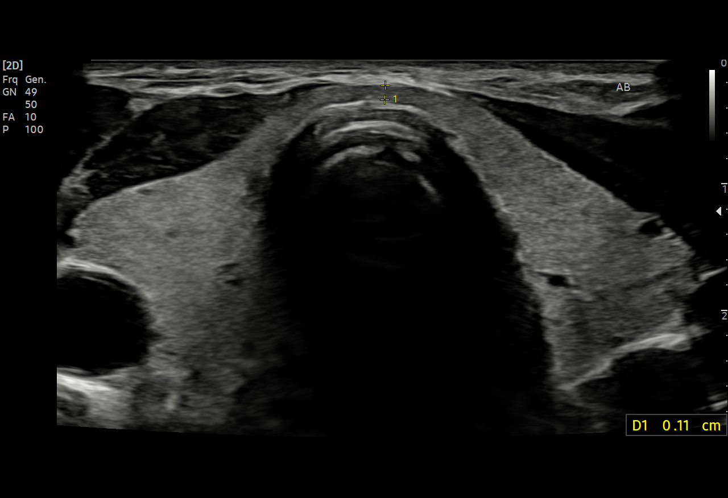
[im 36/62]
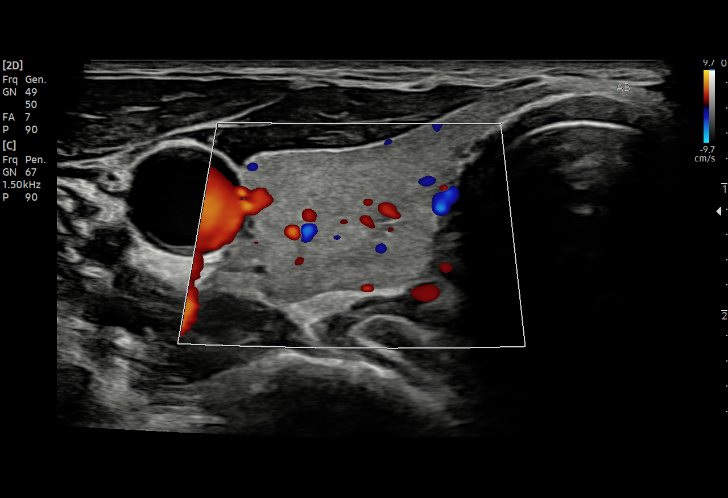
[im 39/62]
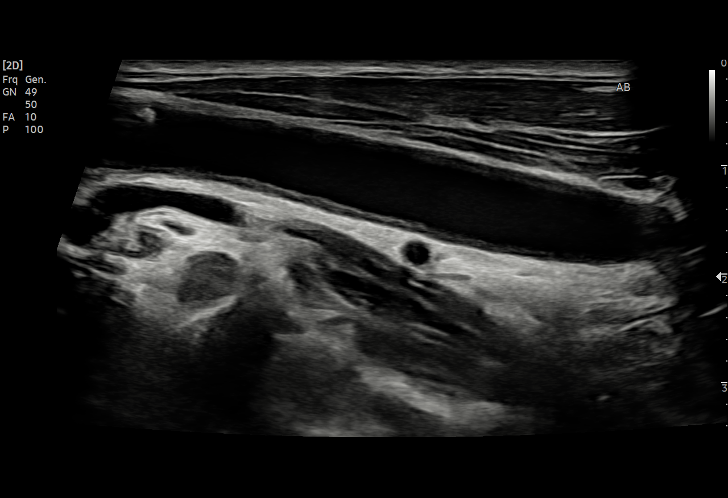
[im 44/62]
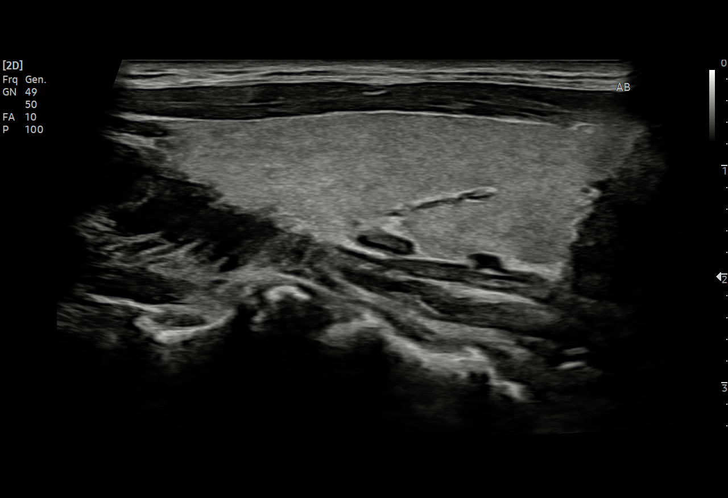
[im 49/62]
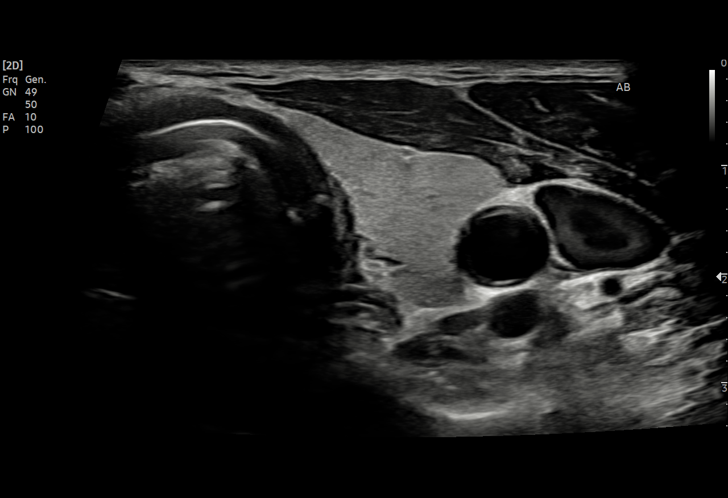
[im 51/62]
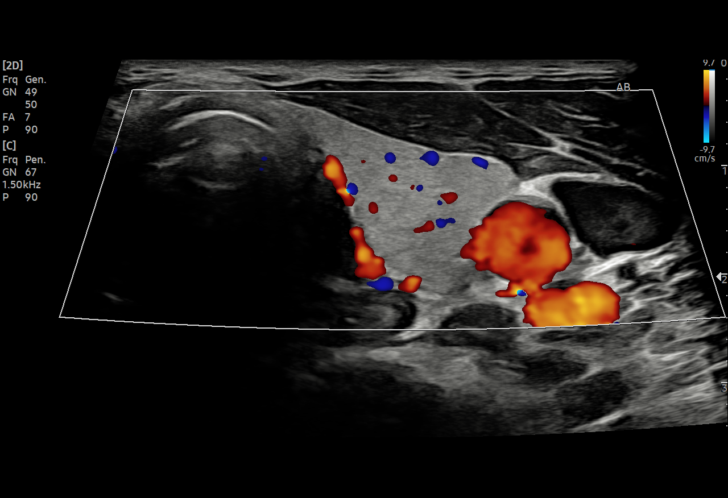
[im 56/62]
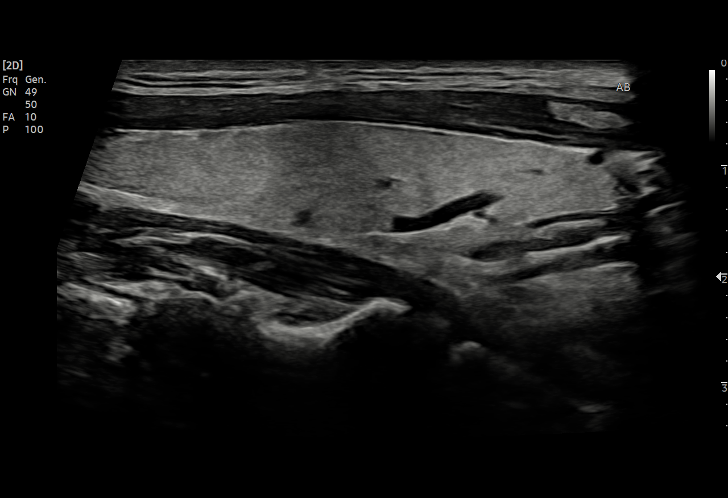
[im 62/62]
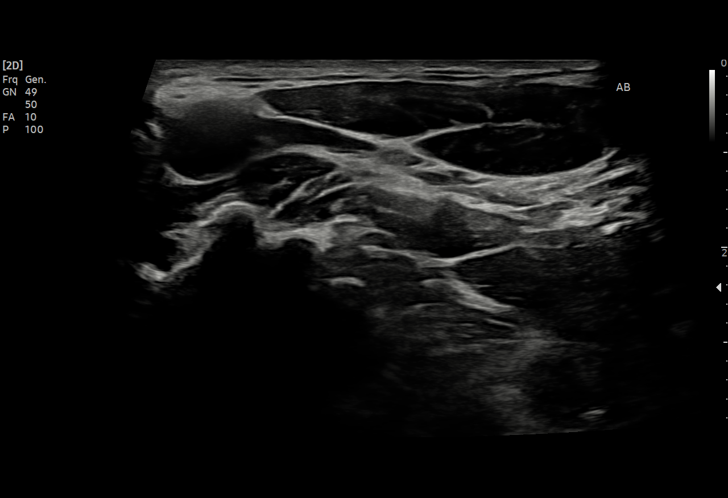

[15 of 25 positions shown; findings below may reference images not displayed]

FINDINGS: Parenchymal Echotexture: Normal

Isthmus: 0.1 cm

Right lobe: 5.0 x 1.5 x 1.6 cm

Left lobe: 5.1 x 1.3 x 1.5 cm

_________________________________________________________

Estimated total number of nodules >/= 1 cm: 0

Number of spongiform nodules >/=  2 cm not described below (TR1): 0

Number of mixed cystic and solid nodules >/= 1.5 cm not described
below (TR2): 0

_________________________________________________________

No discrete nodules are seen within the thyroid gland.

Inferior to the left lobe of the thyroid gland, there is a
well-circumscribed anechoic cystic structure that measures 2.6 x
x 1.6 cm.
IMPRESSION: 1. Normal size and appearance of the thyroid gland.
2. Inferior to the left lobe of the thyroid gland, there is a
well-circumscribed cystic structure that measures up to 2.6 cm in
size. Although this is an indeterminate finding, its imaging
appearance favors a benign cystic lesion. Given its location
inferior to the thyroid gland, differential considerations include
thoracic duct cyst/lymphocele, foregut duplication cyst, or residual
cystic thymic tissue. This would be an unusual location for a
thyroglossal duct cyst. Consider sonographic follow-up at 6 and 12
months to ensure stability.

## 2021-12-07 NOTE — Progress Notes (Signed)
Remote ICD transmission.   

## 2021-12-16 ENCOUNTER — Telehealth: Payer: Self-pay | Admitting: Cardiovascular Disease

## 2021-12-16 MED ORDER — SPIRONOLACTONE 25 MG PO TABS: 25.0000 mg | ORAL_TABLET | Freq: Every day | ORAL | 0 refills | Status: DC

## 2021-12-22 ENCOUNTER — Other Ambulatory Visit: Payer: Self-pay

## 2021-12-22 MED ORDER — SPIRONOLACTONE 25 MG PO TABS: 25.0000 mg | ORAL_TABLET | Freq: Every day | ORAL | 2 refills | Status: DC

## 2021-12-23 ENCOUNTER — Ambulatory Visit (INDEPENDENT_AMBULATORY_CARE_PROVIDER_SITE_OTHER): Payer: 59 | Admitting: *Deleted

## 2021-12-23 DIAGNOSIS — I513 Intracardiac thrombosis, not elsewhere classified: Secondary | ICD-10-CM | POA: Diagnosis not present

## 2021-12-23 DIAGNOSIS — Z7901 Long term (current) use of anticoagulants: Secondary | ICD-10-CM | POA: Diagnosis not present

## 2021-12-23 LAB — POCT INR: INR: 2.2 (ref 2.0–3.0)

## 2021-12-23 NOTE — Patient Instructions (Signed)
Description   Continue taking warfarin 1 tablet daily except for 1/2 tablet on Wednesday and Fridays. Recheck INR in 6 weeks. Coumadin Clinic [phone]

## 2022-02-03 ENCOUNTER — Other Ambulatory Visit: Payer: Self-pay | Admitting: Otolaryngology

## 2022-02-03 ENCOUNTER — Ambulatory Visit (INDEPENDENT_AMBULATORY_CARE_PROVIDER_SITE_OTHER): Payer: 59

## 2022-02-03 DIAGNOSIS — I513 Intracardiac thrombosis, not elsewhere classified: Secondary | ICD-10-CM

## 2022-02-03 DIAGNOSIS — Z7901 Long term (current) use of anticoagulants: Secondary | ICD-10-CM

## 2022-02-03 DIAGNOSIS — E041 Nontoxic single thyroid nodule: Secondary | ICD-10-CM

## 2022-02-03 LAB — POCT INR: INR: 1.8 — AB (ref 2.0–3.0)

## 2022-02-03 NOTE — Patient Instructions (Signed)
Description   Take an extra 0.5 tablet today and then continue taking warfarin 1 tablet daily except for 1/2 tablet on Wednesday and Fridays. Recheck INR in 5 weeks. Coumadin Clinic [phone]

## 2022-02-20 ENCOUNTER — Telehealth: Payer: Self-pay | Admitting: Cardiovascular Disease

## 2022-02-20 MED ORDER — ISOSORBIDE MONONITRATE ER 120 MG PO TB24: 120.0000 mg | ORAL_TABLET | Freq: Every day | ORAL | 0 refills | Status: DC

## 2022-02-20 NOTE — Telephone Encounter (Signed)
*  STAT* If patient is at the pharmacy, call can be transferred to refill team.   1. Which medications need to be refilled? (please list name of each medication and dose if known) isosorbide mononitrate (IMDUR) 120 MG 24 hr tablet  2. Which pharmacy/location (including street and city if local pharmacy) is medication to be sent to? [facility] ([facility] [place] [address]  3. Do they need a 30 day or 90 day supply? [place]

## 2022-02-21 ENCOUNTER — Ambulatory Visit (INDEPENDENT_AMBULATORY_CARE_PROVIDER_SITE_OTHER): Payer: 59

## 2022-02-21 DIAGNOSIS — I255 Ischemic cardiomyopathy: Secondary | ICD-10-CM | POA: Diagnosis not present

## 2022-02-22 LAB — CUP PACEART REMOTE DEVICE CHECK
Battery Remaining Longevity: 87 mo
Battery Voltage: 3.01 V
Brady Statistic AP VP Percent: 0.04 %
Brady Statistic AP VS Percent: 88.42 %
Brady Statistic AS VP Percent: 0.01 %
Brady Statistic AS VS Percent: 11.53 %
Brady Statistic RA Percent Paced: 87.92 %
Brady Statistic RV Percent Paced: 0.05 %
Date Time Interrogation Session: 20230829043823
HighPow Impedance: 43 Ohm
HighPow Impedance: 60 Ohm
Implantable Lead Implant Date: 20120906
Implantable Lead Implant Date: 20120906
Implantable Lead Location: 753859
Implantable Lead Location: 753860
Implantable Lead Model: 185
Implantable Lead Model: 5076
Implantable Lead Serial Number: 358872
Implantable Pulse Generator Implant Date: 20200831
Lead Channel Impedance Value: 323 Ohm
Lead Channel Impedance Value: 342 Ohm
Lead Channel Impedance Value: 380 Ohm
Lead Channel Pacing Threshold Amplitude: 0.5 V
Lead Channel Pacing Threshold Amplitude: 0.875 V
Lead Channel Pacing Threshold Pulse Width: 0.4 ms
Lead Channel Pacing Threshold Pulse Width: 0.4 ms
Lead Channel Sensing Intrinsic Amplitude: 1.5 mV
Lead Channel Sensing Intrinsic Amplitude: 1.5 mV
Lead Channel Sensing Intrinsic Amplitude: 4 mV
Lead Channel Sensing Intrinsic Amplitude: 4 mV
Lead Channel Setting Pacing Amplitude: 1.5 V
Lead Channel Setting Pacing Amplitude: 2.5 V
Lead Channel Setting Pacing Pulse Width: 0.4 ms
Lead Channel Setting Sensing Sensitivity: 0.3 mV

## 2022-03-10 ENCOUNTER — Ambulatory Visit: Payer: 59 | Attending: Internal Medicine | Admitting: *Deleted

## 2022-03-10 DIAGNOSIS — Z7901 Long term (current) use of anticoagulants: Secondary | ICD-10-CM

## 2022-03-10 DIAGNOSIS — I513 Intracardiac thrombosis, not elsewhere classified: Secondary | ICD-10-CM

## 2022-03-10 LAB — POCT INR: INR: 2.7 (ref 2.0–3.0)

## 2022-03-10 NOTE — Patient Instructions (Addendum)
Description   Continue taking warfarin 1 tablet daily except for 1/2 tablet on Wednesday and Fridays. Recheck INR in 5 weeks. Coumadin Clinic [phone]      

## 2022-03-17 NOTE — Progress Notes (Signed)
Remote ICD transmission.   

## 2022-04-03 ENCOUNTER — Ambulatory Visit: Payer: Self-pay | Admitting: Surgery

## 2022-04-07 ENCOUNTER — Telehealth: Payer: Self-pay | Admitting: *Deleted

## 2022-04-07 NOTE — Telephone Encounter (Signed)
   Pre-operative Risk Assessment    Patient Name: Steven [name]  DOB: 05/13/1956 MRN: [id number]      Request for Surgical Clearance    Procedure:   HERNIA SURGERY  Date of Surgery:  Clearance TBD                                 Surgeon:  DR. Erroll Luna Surgeon's Group or Practice Name:  [facility] Phone number:  [phone] Fax number:  [phone] ATTN: Carlene Coria, CMA   Type of Clearance Requested:   - Medical  - Pharmacy:  Hold Warfarin (Coumadin)     Type of Anesthesia:  General    Additional requests/questions:    [name]   04/07/2022, 4:14 PM

## 2022-04-12 NOTE — Telephone Encounter (Signed)
Patient with diagnosis of atrial fibrillation on warfarin for anticoagulation.    Procedure: hernia surgery Date of procedure: TBD   CHA2DS2-VASc Score = 4   This indicates a 4.8% annual risk of stroke. 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  Has had previous warfarin holds without bridging  CrCl 54 Platelet count 170  Per office protocol, patient can hold warfarin for 5 days prior to procedure.   Patient will not need bridging with Lovenox (enoxaparin) around procedure.  **This guidance is not considered finalized until pre-operative APP has relayed final recommendations.**

## 2022-04-14 ENCOUNTER — Ambulatory Visit (INDEPENDENT_AMBULATORY_CARE_PROVIDER_SITE_OTHER): Payer: 59 | Admitting: *Deleted

## 2022-04-14 ENCOUNTER — Encounter: Payer: Self-pay | Admitting: Cardiovascular Disease

## 2022-04-14 ENCOUNTER — Ambulatory Visit: Payer: 59 | Attending: Cardiovascular Disease | Admitting: Cardiovascular Disease

## 2022-04-14 DIAGNOSIS — Z5181 Encounter for therapeutic drug level monitoring: Secondary | ICD-10-CM

## 2022-04-14 DIAGNOSIS — I513 Intracardiac thrombosis, not elsewhere classified: Secondary | ICD-10-CM

## 2022-04-14 DIAGNOSIS — I4891 Unspecified atrial fibrillation: Secondary | ICD-10-CM

## 2022-04-14 DIAGNOSIS — I255 Ischemic cardiomyopathy: Secondary | ICD-10-CM

## 2022-04-14 DIAGNOSIS — Z7901 Long term (current) use of anticoagulants: Secondary | ICD-10-CM | POA: Diagnosis not present

## 2022-04-14 DIAGNOSIS — I5042 Chronic combined systolic (congestive) and diastolic (congestive) heart failure: Secondary | ICD-10-CM

## 2022-04-14 DIAGNOSIS — I25118 Atherosclerotic heart disease of native coronary artery with other forms of angina pectoris: Secondary | ICD-10-CM

## 2022-04-14 DIAGNOSIS — Z95810 Presence of automatic (implantable) cardiac defibrillator: Secondary | ICD-10-CM

## 2022-04-14 DIAGNOSIS — Z79899 Other long term (current) drug therapy: Secondary | ICD-10-CM

## 2022-04-14 DIAGNOSIS — E785 Hyperlipidemia, unspecified: Secondary | ICD-10-CM

## 2022-04-14 LAB — POCT INR: INR: 3.6 — AB (ref 2.0–3.0)

## 2022-04-14 MED ORDER — SPIRONOLACTONE 25 MG PO TABS: 12.5000 mg | ORAL_TABLET | ORAL | 6 refills | Status: DC

## 2022-04-14 NOTE — Patient Instructions (Signed)
Medication Instructions:  DECREASE SPIRONOLACTONE 12.'5MG'$  TAKE EVERY-OTHER-DAY ON EVEN DAYS *If you need a refill on your cardiac medications before your next appointment, please call your pharmacy*   Lab Work: FASTING LIPID, CMET, PRO-BNP AND CBC If you have labs (blood work) drawn today and your tests are completely normal, you will receive your results only by:  MyChart Message (if you have MyChart) OR A paper copy in the mail  If you have any lab test that is abnormal or we need to change your treatment, we will call you to review the results.   Testing/Procedures: NONE  Follow-Up: At [facility], you and your health needs are our priority.  As part of our continuing mission to provide you with exceptional heart care, we have created designated Provider Care Teams.  These Care Teams include your primary Cardiologist (physician) and Advanced Practice Providers (APPs -  Physician Assistants and Nurse Practitioners) who all work together to provide you with the care you need, when you need it.  Your next appointment:   3 month(s)  The format for your next appointment:   In Person  Provider:   Shelva Majestic, MD    Important Information About Sugar

## 2022-04-14 NOTE — Patient Instructions (Signed)
Description   Do not take any warfarin tomorrow (already taken today's dose) then continue taking warfarin 1 tablet daily except for 1/2 tablet on Wednesday and Fridays. Recheck INR in 4 weeks. Coumadin Clinic [phone]

## 2022-04-14 NOTE — Progress Notes (Unsigned)
Cardiology Office Note    Date:  04/18/2022   ID:  Steven [name], Steven [name] [birth year], MRN [id number]  PCP:  Steven Critchley, MD  Cardiologist:  Shelva Majestic, MD   2-monthfollow-up evaluation  History of Present Illness:  Steven [name] is a 66y.o. male with documented severe ischemic cardiomyopathy following an extensive anterior myocardial infarction with subsequent defibrillator for primary prevention.    He presents for 15 month follow-up cardiology evaluation.  [name] myocardial [place] January [place]. .[name] He presented after a three-hour delay to the hospital and was taken emergently to the cardiac catheterization laboratory where I performed successful emergent intervention with the door to balloon time of only 25 minutes. His LAD was successfully recanalized. CPK increased to 8012 with an MB of 716 and troponin was greater than 100 initially. He subsequently wore a life vest and ultimately after several months without significant improvement of LV function he underwent implantable cardio defibrillator insertion.  Interrogation of his defibrillator in the past had shown episodes of nonsustained VT and his dose of carvedilol has been further titrated.   A followup echo Doppler study on 08/04/2013 showed an ejection fraction of 20-25% with diffuse hypokinesis but there was akinesis of the entire anteroseptal myocardium and apical region. There now appear to be an apparent medium-sized mural apical thrombus which was not noted on his last echo. He denies paresthesias. He denies episodes of chest pain. He denies shortness of breath.At his last office visit in February in light of his moderate sized mural thrombus, Plavix, was discontinued, and he was started on Coumadin therapy.   In August 2015 he tripped and hit his head on a bar recently developed severe headache, transient leg weakness, which evolves into slurred  speech.  He underwent evaluation 1-1/2 weeks later and was found  to have a subdural hematoma.  He underwent a right burr hole subdural hematoma evacuation on 02/05/2014 [facility]  Initially aspirin and Coumadin were on hold.  He had a follow-up CT scan and ultimately was given clearance to reinstitute Coumadin anticoagulation in light of his severe cardiomyopathy with an ejection fraction of 15-20% on echo on 03/13/2014 and medium-sized apical mural thrombus which had been documented on a previous echo.  This ultimately has stabilized and he has been back on warfarin anticoagulation.     When I evaluated him in 2016 he denied any chest pain, shortness of breath or recurrent palpitations.  His ICD was  last interrogated one month ago by Dr. CLoletha [name]  He was noted to have very short nonsustained bursts of nonsustained VT with one episode lasting 8 beats at 212 bpm on 07/09/2014.  His device was adjusted so as it would intervene more quickly and avoid syncope.     I saw him in September 2018 and prior to that evaluation had not seen him since 2016.  Over this time period, he has been without recurrent chest pain.  At times he has had some occasional dizziness and weakness.  He saw Dr. CSallyanne Kusteron August 09 2016.  His device was interrogated and showed 72% atrial pacing and no ventricular pacing.  There was one 8 beat episode of nonsustained VT since his last device check.  His last echo Doppler study was in September 2015.  He was evaluated by Dr. EJunious Silkfor insertion of a penile implantation.  Preoperative clearance was given by Dr. Sallyanne [name] was advised that he hold his warfarin and he will stop his aspirin permanently.  He was  seen at [facility] and was felt to have stable stage III chronic kidney disease.  His blood pressure was controlled and he was euvolemic.    He underwent an echo Doppler study on April 18, 2017.  This continued to show reduced ejection  fraction at 20-25%.  He had significant wall motion abnormality with akinesis of the apical anterior septum, inferolateral wall, and the entire apex.  There was grade 1 diastolic dysfunction.  He was seen by Dr. Sallyanne [name] in February 2019.  At that time, his ICD had normal device function.  He has been undergoing remote downloads every 3 months via [facility].  He has not experienced any episodes of recent angina.  He was found to have rare episodes of nonsustained VT only 3 in the last 12 months.  At times he notes some mild dizziness if he stands up very fast.   I saw him in April 2019.  Since that time, he has had evaluations with Dr. Sallyanne [name] and was not device dependent but had 80% atrial pacing.  His device had recorded lengthy episodes of NSVT but had not delivered therapies.  On February 24, 2019 he underwent dual-chamber ICD generator change out due to the device reaching ERI.  He saw Dr. Sallyanne [name] in follow-up in November 2020.  He has had issues with mild exertionally precipitated chest tightness which has been a chronic problem for some time and over several follow-up visits with Dr. Sallyanne [name] he was started on isosorbide and ultimately titrated to 120 mg daily.   I had not seen him since 2019 but evaluated him in a telemedicine visit on December 04, 2019.  At that time he continued to have chronic class II stable anginal symptomatology precipitated by increasing activity.    During that evaluation I recommend the addition of low-dose amlodipine 2.5 mg and he feels this improved his mild chronic anginal symptomatology.   He is unaware of any recurrent palpitations or defibrillator discharge.  He has continued to be on warfarin anticoagulation.    I saw him in October 2021 and since his prior evaluation he had undergone a prostate biopsy for which warfarin was held.  He is followed by Dr. Junious [name].  He underwent a repeat echo Doppler study on January 05, 2020 which continue to show very low EF at 20 to 25% with  global hypokinesis.  Estimated RV pressure was normal at 23 mm.  He continued to experience some exertional dyspnea and denied  any chest pain.  During my evaluation, I recommended transition to [facility] and he was instructed to discontinue lisinopril for a minimum of 36 hours prior to initiation.  He has been on Farxiga both for his borderline diabetes mellitus as well as CHF.  He was evaluated by Dr. Sallyanne [name] on May 17, 2020 and had functional class I-II was euvolemic clinically and by OptiVol measurements.  Historicallyhe has had approximately 1 episode of nonsustained VT every month but had only 4 events in the last 11 months.  When I saw him on July 21, 2020 he denied any chest pain but had  noticed some mild dizziness.  He was on amlodipine 2.5 mg daily, carvedilol 25 mg twice a day, Farxiga 10 mg, Entresto 24/26 mg twice a day, furosemide 40 mg, isosorbide 120 mg, spironolactone 12.5 mg.  He denied any leg swelling or increasing  shortness of breath.  During that evaluation his blood pressure was low and he was euvolemic.  I suggested he reduce his furosemide to 20 mg and reduce spironolactone from 12.5 mg twice a day down to 12.5 mg daily.  He was evaluated by Joslyn Hy in the office on September 01, 2020.  At that time, his blood pressure remained soft precluding further titration of Entresto.  I last saw him on December 28, 2020 at which time he continued to feel well and denied any recurrent anginal symptoms or dizziness.  He was unaware of any palpitations.  He continued to be on amlodipine 2.5 mg daily, carvedilol 25 mg twice a day, Farxiga 10 mg daily, Entresto 24/26 mg twice a day, furosemide 20 mg daily, isosorbide 120 mg daily, and spironolactone 12.5 mg daily.  He was on warfarin for anticoagulation followed in the Coumadin clinic.  He was taking Stiolto Respimat and as needed albuterol.   Since I last saw him he was evaluated by Dr. Sallyanne [name] on date May 30, 2021 and remained  stable.  Clinically he was euvolemic.  He continued to be on 3 antianginal medications including amlodipine carvedilol and isosorbide.  He had normal device function and had rare nonsustained VT episodes.  Presently, he feels well and denies any significant shortness of breath or awareness of palpitations.  At times he notes a vague chest sensation with walking.  He continues to be on amlodipine 2.5 mg, carvedilol 25 mg twice a day, furosemide 20 mg daily, isosorbide 120 mg in the morning, in addition to Entresto 24/26 mg twice a day.  He is on warfarin.  He has been taking spironolactone 25 mg daily.  He presents for evaluation.   Past Medical History:  Diagnosis Date   Acute MI, anterior wall (HCC)    CAD (coronary artery disease)    2D ECHO, 07/13/2011 - EF <25%, LV moderatelty dilated, LA moderately dilatedLEXISCAN, 12/14/2011 - moderate-severe perfusion defect seen in the basal anteroseptal, mid anterior, apicacl anterior, apical, apical inferior, and apical lateral regions, post-stress EF 25%, new EKG changes from baseline abnormalities   Hypertension 08/08/2021   Inguinal hernia, left    Pre-diabetes     Past Surgical History:  Procedure Laterality Date   CARDIAC CATHETERIZATION  11/25/2010   Predilation balloon-Apex monorail 2x68m, Cutting balloon-2.25x154m resulting in a reduction of 100% stenosis down to less than 10%   CARDIAC CATHETERIZATION  07/12/2010   LAD stented with a 3.5x2486mare-metal Veriflex stent resulting in a reduction of 100% lesion to 0%   CARDIAC DEFIBRILLATOR PLACEMENT  03/02/2011   Medtronic Protecta XT DR, model #D3#T254DIYerial #PS#[id number]  GOLD SEED IMPLANT N/A 07/21/2021   Procedure: GOLD SEED IMPLANT;  Surgeon: BorRaynelle BringD;  Location: [facility];  Service: Urology;  Laterality: N/A;  NEEDS 30 MIN   ICD GENERATOR CHANGEOUT N/A 02/24/2019   Procedure: ICD GENERATOR CHANGEOUT;  Surgeon: CroSanda KleinD;  Location: [facility] [place] [facility];  Service:  Cardiovascular;  Laterality: N/A;   PACEMAKER PLACEMENT  07/14/2010   Temporary placement of pacemaker, if rhythm issue continues will need a permanent device   SPACE OAR INSTILLATION N/A 07/21/2021   Procedure: SPACE OAR INSTILLATION;  Surgeon: BorRaynelle BringD;  Location: [facility];  Service: Urology;  Laterality: N/A;    Current Medications: Outpatient Medications Prior to Visit  Medication Sig Dispense Refill   amLODipine (NORVASC) 2.5 MG tablet Take 2.5 mg by mouth daily.     atorvastatin (LIPITOR) 80  MG tablet Take 80 mg by mouth daily.     carvedilol (COREG) 25 MG tablet TAKE 1 TABLET BY MOUTH TWICE DAILY WITH A MEAL (Patient taking differently: Take 25 mg by mouth 2 (two) times daily with a meal.) 180 tablet 3   Cholecalciferol (VITAMIN D) 125 MCG (5000 UT) CAPS Take 5,000 Units by mouth daily.     dapagliflozin propanediol (FARXIGA) 10 MG TABS tablet Take 10 mg by mouth daily.     furosemide (LASIX) 20 MG tablet TAKE 1 TABLET BY MOUTH ONCE DAILY . APPOINTMENT REQUIRED FOR FUTURE REFILLS 90 tablet 2   isosorbide mononitrate (IMDUR) 120 MG 24 hr tablet Take 1 tablet (120 mg total) by mouth daily. 90 tablet 0   Multiple Vitamin (MULTIVITAMIN) tablet Take 1 tablet by mouth daily.     nitroGLYCERIN (NITROSTAT) 0.4 MG SL tablet Place 1 tablet (0.4 mg total) under the tongue every 5 (five) minutes as needed. Usual dose is q 5 minutes x 3 doses. 75 tablet 0   Pancrelipase, Lip-Prot-Amyl, (ZENPEP PO) Take 1-4 tablets by mouth daily.     sacubitril-valsartan (ENTRESTO) 24-26 MG Take 1 tablet by mouth 2 (two) times daily. 180 tablet 1   tamsulosin (FLOMAX) 0.4 MG CAPS capsule Take 0.4 mg by mouth at bedtime.     warfarin (COUMADIN) 5 MG tablet 1/2 TO 1 (ONE-HALF TO ONE) ONCE DAILY AS DIRECTED BY THE COUMADIN CLINIC 90 tablet 1   spironolactone (ALDACTONE) 25 MG tablet Take 1 tablet (25 mg total) by mouth daily. 90 tablet 2   No facility-administered medications prior to visit.     Allergies:    Patient has no known allergies.   Social History   Socioeconomic History   Marital status: Married    Spouse name: Not on file   Number of children: Not on file   Years of education: Not on file   Highest education level: Not on file  Occupational History   Not on file  Tobacco Use   Smoking status: Former    Types: Cigarettes, Cigars   Smokeless tobacco: Never  Vaping Use   Vaping Use: Never used  Substance and Sexual Activity   Alcohol use: Yes    Alcohol/week: 2.0 - 3.0 standard drinks of alcohol    Types: 2 - 3 drink(s) per week   Drug use: No   Sexual activity: Not on file  Other Topics Concern   Not on file  Social History Narrative   Not on file   Social Determinants of Health   Financial Resource Strain: Not on file  Food Insecurity: Not on file  Transportation Needs: Not on file  Physical Activity: Not on file  Stress: Not on file  Social Connections: Not on file    Socially he is single, he was born in [place].  He has 1 child.  There is remote tobacco history.    Family History:  He is from [place].  ROS General: Negative; No fevers, chills, or night sweats;  HEENT: Negative; No changes in vision or hearing, sinus congestion, difficulty swallowing Pulmonary: No recent wheezing Cardiovascular: See HPI GI: Negative; No nausea, vomiting, diarrhea, or abdominal pain GU: Positive for prostate biopsy, erectile dysfunction; No dysuria, hematuria, or difficulty voiding Musculoskeletal: Negative; no myalgias, joint pain, or weakness Hematologic/Oncology: Negative; no easy bruising, bleeding Endocrine: Negative; no heat/cold intolerance; no diabetes Neuro: Negative; no changes in balance, headaches Skin: Negative; No rashes or skin lesions Psychiatric: Negative; No behavioral problems, depression Sleep: Negative;  No snoring, daytime sleepiness, hypersomnolence, bruxism, restless legs, hypnogognic hallucinations, no cataplexy Other comprehensive 14 point system  review is negative.   PHYSICAL EXAM:   VS:  BP (!) 88/60 (BP Location: Left Arm, Patient Position: Sitting, Cuff Size: Normal)   Pulse 67   Ht 5' 10"  (1.778 m)   Wt 166 lb (75.3 kg)   BMI 23.82 kg/m     Repeat blood pressure by me was 98/60 supine and 90/60 standing  Wt Readings from Last 3 Encounters:  04/14/22 166 lb (75.3 kg)  07/20/21 162 lb (73.5 kg)  05/30/21 164 lb 9.6 oz (74.7 kg)    General: Alert, oriented, no distress.  Skin: normal turgor, no rashes, warm and dry HEENT: Normocephalic, atraumatic. Pupils equal round and reactive to light; sclera anicteric; extraocular muscles intact; Nose without nasal septal hypertrophy Mouth/Parynx benign; Mallinpatti scale e Neck: No JVD, no carotid bruits; normal carotid upstroke Lungs: clear to ausculatation and percussion; no wheezing or rales Chest wall: without tenderness to palpitation Heart: PMI not displaced, RRR, s1 s2 normal, 1/6 systolic murmur, no diastolic murmur, no rubs, gallops, thrills, or heaves Abdomen: soft, nontender; no hepatosplenomehaly, BS+; abdominal aorta nontender and not dilated by palpation. Back: no CVA tenderness Pulses 2+ Musculoskeletal: full range of motion, normal strength, no joint deformities Extremities: no clubbing cyanosis or edema, Homan's sign negative  Neurologic: grossly nonfocal; Cranial nerves grossly wnl Psychologic: Normal mood and affect  Studies/Labs Reviewed:   April 14, 2022 ECG (independently read by me): Atrial paced, PR 228 msec, old anterior infarct, inferolateral t changes    December 28, 2020 ECG (independently read by me):  Atrial paced, PR 216; QS V1-4 c/w prior anterior MI; PVC, QTc 418  July 21, 2020 ECG (independently read by me): Atrial paced, prolonged AV conduction,PR 246 msec , LAHB, Anterolateral ST changes  April 12, 2020 ECG (independently read by me): Atrial paced at 63; old anterolateral infarct,  LAHB, QTc 417 msec  September 27 2019 ECG (independently  read by me): Atrially paced rhythm at 72 bpm.  One PVC.  Poor progression consistent with old anterior infarction.  Anterolateral ST-T changes   September 2016 ECG (independently read by me): Atrially paced rhythm at 64 bpm.  No ectopy.  Poor anterior R-wave progression concordant with old anterior MI.   March 2016 ECG (independently read by me): Atrially paced rhythm at 74 bpm.  Increased PR interval at 212 ms.  ST-T changes anterolaterally.   November 2015 ECG (independently read by me): Atrially paced rhythm.  Old anterior wall MI.  Per Ms. Lee noted ST-T changes laterally   September 2015 ECG (independently read by me): Sinus rhythm at 63 beats per minute.  Old anterior wall myocardial infarction, poor R. progression V1 through V4 and T-wave changes V3 through V6, leads 1, and L.    August 18, 2013 ECG (independently read by me):  Atrially paced at 64 beats per minute. QTc interval 427 ms. QRS duration 98 ms. Diffuse anterolateral T-wave changes secondary to his prior MI     Prior ECG of 02/27/2013: Atrial  paced rhythm at 64 beats per minute. Evidence for his prior anterolateral wall myocardial infarction with diffuse T-wave abnormality and precordial Q waves V1 through V5    Recent Labs:    Latest Ref Rng & Units 07/20/2021    1:55 PM 06/17/2021    3:40 PM 08/31/2020    8:46 AM  BMP  Glucose 70 - 99 mg/dL 143   103  BUN 8 - 23 mg/dL 30   20   Creatinine 0.61 - 1.24 mg/dL 1.39  1.50  1.45   BUN/Creat Ratio 10 - 24   14   Sodium 135 - 145 mmol/L 135   138   Potassium 3.5 - 5.1 mmol/L 4.2   4.6   Chloride 98 - 111 mmol/L 103   104   CO2 22 - 32 mmol/L 26   21   Calcium 8.9 - 10.3 mg/dL 9.2   8.9         Latest Ref Rng & Units 04/23/2020    9:15 AM 08/29/2017    9:04 AM 09/03/2014    8:03 AM  Hepatic Function  Total Protein 6.0 - 8.5 g/dL 7.3  7.7  6.8   Albumin 3.8 - 4.8 g/dL 4.3  4.3  4.1   AST 0 - 40 IU/L 25  25  25    ALT 0 - 44 IU/L 13  12  17    Alk Phosphatase 44 -  121 IU/L 65  55  50   Total Bilirubin 0.0 - 1.2 mg/dL 0.7  0.5  0.5        Latest Ref Rng & Units 07/20/2021    1:55 PM 04/23/2020    9:15 AM 02/19/2019    9:38 AM  CBC  WBC 4.0 - 10.5 K/uL 5.1  5.5  6.1   Hemoglobin 13.0 - 17.0 g/dL 13.7  15.0  14.6   Hematocrit 39.0 - 52.0 % 40.7  45.0  42.4   Platelets 150 - 400 K/uL 170  149  156    Lab Results  Component Value Date   MCV 97.6 07/20/2021   MCV 95 04/23/2020   MCV 92 02/19/2019   Lab Results  Component Value Date   TSH 2.280 04/23/2020   Lab Results  Component Value Date   HGBA1C 5.8 (H) 07/20/2021     BNP    Component Value Date/Time   BNP 301.3 (H) 08/31/2020 0846   BNP 124.2 (H) 09/03/2014 0803    ProBNP    Component Value Date/Time   PROBNP 211 04/23/2020 0915   PROBNP 9719.0 (H) 11/23/2010 2006     Lipid Panel     Component Value Date/Time   CHOL 142 04/23/2020 0915   CHOL 161 07/01/2013 0838   TRIG 86 04/23/2020 0915   TRIG 108 07/01/2013 0838   HDL 48 04/23/2020 0915   HDL 43 07/01/2013 0838   CHOLHDL 3.0 04/23/2020 0915   CHOLHDL 3.3 09/03/2014 0803   VLDL 16 09/03/2014 0803   LDLCALC 78 04/23/2020 0915   LDLCALC 96 07/01/2013 0838   LABVLDL 16 04/23/2020 0915     RADIOLOGY: No results found.   Additional studies/ records that were reviewed today include:   ECHO 01/05/2020 IMPRESSIONS   1. Since the last study on 04/18/2017 remains very low at 20-25% with  apical aneurysm and no thrombus on Definity echocontrast images.   2. Left ventricular ejection fraction, by estimation, is 20 to 25%. The  left ventricle has severely decreased function. The left ventricle  demonstrates global hypokinesis. The left ventricular internal cavity size  was moderately dilated. Left  ventricular diastolic parameters are consistent with Grade I diastolic  dysfunction (impaired relaxation).   3. Right ventricular systolic function is normal. The right ventricular  size is normal. There is normal  pulmonary artery systolic pressure. The  estimated right ventricular systolic pressure is 22.3 mmHg.   4. The mitral valve is  normal in structure. Mild mitral valve  regurgitation. No evidence of mitral stenosis.   5. The aortic valve is normal in structure. Aortic valve regurgitation is  not visualized. No aortic stenosis is present.   6. The inferior vena cava is normal in size with greater than 50%  respiratory variability, suggesting right atrial pressure of 3 mmHg.    ASSESSMENT:    1. Coronary artery disease of native artery of native heart with stable angina pectoris (HCC)   2. Cardiomyopathy, ischemic   3. Apical mural thrombus   4. Warfarin anticoagulation   5. Chronic combined systolic and diastolic heart failure ([place])   6. Hyperlipidemia LDL goal <70   7. Implantable cardioverter-defibrillator (ICD) in situ   8. Medication management     PLAN:  1.  CAD: Mr. Mazer suffered a large anterior wall myocardial infarction with late presentation in January 2012.  He remains without definitive anginal symptomatology on anti-ischemic regimen consisting of amlodipine 2.5 mg, isosorbide 120 mg daily daily in addition to carvedilol 25 mg twice a day.  2.  Ischemic cardiomyopathy: He was previously demonstrated to have an EF of 20 to 25%.  Initially had also developed apical thrombus for which he was on warfarin therapy.  His echo Doppler study from July 2021 continue to show EF of 20 to 25%.  He is euvolemic on exam.  His low blood pressure has negated further titration of guideline directed medical therapy.  He continues to be on Entresto 24/26 mg twice a day, carvedilol 25 mg twice a day, and also has been on furosemide 20 mg daily, isosorbide 120 mg daily, and spironolactone 25 mg daily.  His blood pressure today is low and decreases to 90/60 standing.  Since he appears euvolemic I have recommended he reduce spironolactone down to 12.5 mg and take this every other day.  He will continue his  daily Lasix.  3: ICD: He underwent a generator change out for his dual-chamber ICD in August 2020.  He continues to be followed by Dr. Sallyanne [name] and at last evaluation in December 2022 remained stable with normal device function.  He has continued to have similar pattern of rare to occasional short bursts of nonsustained VT no more than once every 2 months which is asymptomatic.   4.  Warfarin anticoagulation.  Previously he was found to have a moderate size apical mural thrombus; not confirmed on his most recent echo.  He has continued to be on warfarin with his reduced LV function.  5: Hyperlipidemia.  Presently he is now on atorvastatin 80 mg daily. LDL cholesterol has not been recently checked.  I have recommended reevaluation.  6.  History of nonsustained VT documented prior to his generator change.  He is undergoing device checks at 61-monthintervals followed by Dr. CSallyanne [name]  I have scheduled him for a fasting comprehensive metabolic panel, CBC, proBNP, and fasting lipid studies.  Adjustments will be made accordingly and he will be notified of the results.  I will see him in 3 months for follow-up evaluation.  Medication Adjustments/Labs and Tests Ordered: Current medicines are reviewed at length with the patient today.  Concerns regarding medicines are outlined above.  Medication changes, Labs and Tests ordered today are listed in the Patient Instructions below. Patient Instructions  Medication Instructions:  DECREASE SPIRONOLACTONE 12.5MG TAKE EVERY-OTHER-DAY ON EVEN DAYS *If you need a refill on your cardiac medications before your next appointment, please call your pharmacy*   Lab Work: FASTING LIPID, CMET, PRO-BNP  AND CBC If you have labs (blood work) drawn today and your tests are completely normal, you will receive your results only by:  [place] (if you have MyChart) OR A paper copy in the mail  If you have any lab test that is abnormal or we need to change your  treatment, we will call you to review the results.   Testing/Procedures: NONE  Follow-Up: At [facility], you and your health needs are our priority.  As part of our continuing mission to provide you with exceptional heart care, we have created designated Provider Care Teams.  These Care Teams include your primary Cardiologist (physician) and Advanced Practice Providers (APPs -  Physician Assistants and Nurse Practitioners) who all work together to provide you with the care you need, when you need it.  Your next appointment:   3 month(s)  The format for your next appointment:   In Person  Provider:   Shelva Majestic, MD    Important Information About Sugar         Signed, Shelva Majestic, MD  04/18/2022 6:14 PM    [facility] [address], [place], [place], [place]  [address] Phone: [phone]

## 2022-04-18 ENCOUNTER — Encounter: Payer: Self-pay | Admitting: Cardiovascular Disease

## 2022-04-18 NOTE — Telephone Encounter (Signed)
   Patient Name: Steven [name]  DOB: 06-04-1956 MRN: [id number]  Primary Cardiologist: Shelva Majestic, MD  Chart reviewed as part of pre-operative protocol coverage. Given past medical history and time since last visit, based on ACC/AHA guidelines, Hutson P Abts is at acceptable risk for the planned procedure without further cardiovascular testing.   Last seen 04/14/22 by Dr. Claiborne Billings doing overall well though Spironolactone dose reduced due to hypotension. He has follow up in 3 months.   Per pharmacy team he may hold Warfarin 5 days prior to planned procedure and will not require Lovenox bridge.   The patient was advised that if he develops new symptoms prior to surgery to contact our office to arrange for a follow-up visit, and he verbalized understanding.  I will route this recommendation to the requesting party via Epic fax function and remove from pre-op pool.  Please call with questions.  Loel Dubonnet, NP 04/18/2022, 12:06 PM

## 2022-04-19 LAB — LIPID PANEL
Chol/HDL Ratio: 2.7 ratio (ref 0.0–5.0)
Cholesterol, Total: 121 mg/dL (ref 100–199)
HDL: 45 mg/dL (ref >39)
LDL Chol Calc (NIH): 64 mg/dL (ref 0–99)
Triglycerides: 50 mg/dL (ref 0–149)
VLDL Cholesterol Cal: 12 mg/dL (ref 5–40)

## 2022-04-19 LAB — COMPREHENSIVE METABOLIC PANEL
ALT: 14 IU/L (ref 0–44)
AST: 22 IU/L (ref 0–40)
Albumin/Globulin Ratio: 1.4 (ref 1.2–2.2)
Albumin: 4.3 g/dL (ref 3.9–4.9)
Alkaline Phosphatase: 86 IU/L (ref 44–121)
BUN/Creatinine Ratio: 12 (ref 10–24)
BUN: 15 mg/dL (ref 8–27)
Bilirubin Total: 0.8 mg/dL (ref 0.0–1.2)
CO2: 22 mmol/L (ref 20–29)
Calcium: 9.2 mg/dL (ref 8.6–10.2)
Chloride: 103 mmol/L (ref 96–106)
Creatinine, Ser: 1.23 mg/dL (ref 0.76–1.27)
Globulin, Total: 3 g/dL (ref 1.5–4.5)
Glucose: 111 mg/dL — ABNORMAL HIGH (ref 70–99)
Potassium: 4.4 mmol/L (ref 3.5–5.2)
Sodium: 140 mmol/L (ref 134–144)
Total Protein: 7.3 g/dL (ref 6.0–8.5)
eGFR: 64 mL/min/{1.73_m2} (ref >59)

## 2022-04-19 LAB — CBC
Hematocrit: 44.9 % (ref 37.5–51.0)
Hemoglobin: 15.3 g/dL (ref 13.0–17.7)
MCH: 31.4 pg (ref 26.6–33.0)
MCHC: 34.1 g/dL (ref 31.5–35.7)
MCV: 92 fL (ref 79–97)
Platelets: 166 10*3/uL (ref 150–450)
RBC: 4.88 x10E6/uL (ref 4.14–5.80)
RDW: 12.7 % (ref 11.6–15.4)
WBC: 6 10*3/uL (ref 3.4–10.8)

## 2022-04-19 LAB — PRO B NATRIURETIC PEPTIDE: NT-Pro BNP: 587 pg/mL — ABNORMAL HIGH (ref 0–376)

## 2022-04-28 ENCOUNTER — Telehealth: Payer: Self-pay

## 2022-04-28 NOTE — Telephone Encounter (Signed)
   Pre-operative Risk Assessment    Patient Name: Steven [name]  DOB: May 22, 1956 MRN: [id number]      Request for Surgical Clearance    Procedure:   HERNIA SURGERY  Date of Surgery:  Clearance TBD                                 Surgeon:  DR Brantley Stage Surgeon's Group or Practice Name:  [facility] Phone number:  [phone] Fax number:  [phone]   Type of Clearance Requested:   - Pharmacy:  Hold Warfarin (Coumadin) WE NEED INSTRUCTIONS ON HOW TO THE PATIENT SHOULD HOLD MEDICATION   Type of Anesthesia:  General    Additional requests/questions:  Please fax a copy of CARDIAC CLEARANCE to the surgeon's office.  Signed, Jeanmarie Plant [facility]  CCMA 04/28/2022, 5:25 PM

## 2022-05-03 NOTE — Telephone Encounter (Signed)
Patient with diagnosis of  atrial fibrillation on warfarin for anticoagulation.    Procedure: hernia surgery  Date of procedure: TBD   CHA2DS2-VASc Score = 4   This indicates a 4.8% annual risk of stroke. 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 Has had previous warfarin holds without bridging   Per office protocol, patient can hold warfarin  for 5 days prior to procedure.   Patient will not need bridging with Lovenox (enoxaparin) around procedure.  **This guidance is not considered finalized until pre-operative APP has relayed final recommendations.**

## 2022-05-03 NOTE — Telephone Encounter (Signed)
     Primary Cardiologist: Shelva Majestic, MD  Chart reviewed as part of pre-operative protocol coverage. Given past medical history and time since last visit, based on ACC/AHA guidelines, Steven [name] would be at acceptable risk for the planned procedure without further cardiovascular testing.   His RCRI is a class II risk, 0.9% risk of major cardiac event.  Patient with diagnosis of  atrial fibrillation on warfarin for anticoagulation.     Procedure: hernia surgery  Date of procedure: TBD     CHA2DS2-VASc Score = 4   This indicates a 4.8% annual risk of stroke. 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 Has had previous warfarin holds without bridging    Per office protocol, patient can hold warfarin  for 5 days prior to procedure.   Patient will not need bridging with Lovenox (enoxaparin) around procedure.    I will route this recommendation to the requesting party via Epic fax function and remove from pre-op pool.  Please call with questions.  Jossie Ng. Morna Flud NP-C     05/03/2022, 11:28 AM [place] [address] Office [phone] Fax [phone]

## 2022-05-07 ENCOUNTER — Other Ambulatory Visit: Payer: Self-pay

## 2022-05-07 ENCOUNTER — Emergency Department (HOSPITAL_COMMUNITY): Payer: 59

## 2022-05-07 ENCOUNTER — Inpatient Hospital Stay (HOSPITAL_COMMUNITY)
Admission: EM | Admit: 2022-05-07 | Discharge: 2022-05-10 | DRG: 291 | Disposition: A | Discharge status: Home / Self Care | Payer: 59 | Attending: Cardiovascular Disease | Admitting: Cardiovascular Disease

## 2022-05-07 ENCOUNTER — Encounter (HOSPITAL_COMMUNITY): Payer: Self-pay

## 2022-05-07 DIAGNOSIS — J069 Acute upper respiratory infection, unspecified: Secondary | ICD-10-CM | POA: Diagnosis present

## 2022-05-07 DIAGNOSIS — R739 Hyperglycemia, unspecified: Secondary | ICD-10-CM | POA: Diagnosis present

## 2022-05-07 DIAGNOSIS — I130 Hypertensive heart and chronic kidney disease with heart failure and stage 1 through stage 4 chronic kidney disease, or unspecified chronic kidney disease: Principal | ICD-10-CM | POA: Diagnosis present

## 2022-05-07 DIAGNOSIS — Z79899 Other long term (current) drug therapy: Secondary | ICD-10-CM

## 2022-05-07 DIAGNOSIS — I252 Old myocardial infarction: Secondary | ICD-10-CM

## 2022-05-07 DIAGNOSIS — Z95810 Presence of automatic (implantable) cardiac defibrillator: Secondary | ICD-10-CM | POA: Diagnosis present

## 2022-05-07 DIAGNOSIS — I5043 Acute on chronic combined systolic (congestive) and diastolic (congestive) heart failure: Secondary | ICD-10-CM | POA: Diagnosis not present

## 2022-05-07 DIAGNOSIS — J9601 Acute respiratory failure with hypoxia: Secondary | ICD-10-CM | POA: Diagnosis present

## 2022-05-07 DIAGNOSIS — N1832 Chronic kidney disease, stage 3b: Secondary | ICD-10-CM | POA: Diagnosis present

## 2022-05-07 DIAGNOSIS — I5023 Acute on chronic systolic (congestive) heart failure: Secondary | ICD-10-CM | POA: Diagnosis present

## 2022-05-07 DIAGNOSIS — E8770 Fluid overload, unspecified: Secondary | ICD-10-CM

## 2022-05-07 DIAGNOSIS — N183 Chronic kidney disease, stage 3 unspecified: Secondary | ICD-10-CM | POA: Diagnosis present

## 2022-05-07 DIAGNOSIS — I4891 Unspecified atrial fibrillation: Secondary | ICD-10-CM | POA: Diagnosis present

## 2022-05-07 DIAGNOSIS — Z7901 Long term (current) use of anticoagulants: Secondary | ICD-10-CM

## 2022-05-07 DIAGNOSIS — I255 Ischemic cardiomyopathy: Secondary | ICD-10-CM | POA: Diagnosis present

## 2022-05-07 DIAGNOSIS — Z1152 Encounter for screening for COVID-19: Secondary | ICD-10-CM

## 2022-05-07 DIAGNOSIS — I5031 Acute diastolic (congestive) heart failure: Secondary | ICD-10-CM | POA: Diagnosis present

## 2022-05-07 DIAGNOSIS — E785 Hyperlipidemia, unspecified: Secondary | ICD-10-CM | POA: Diagnosis present

## 2022-05-07 DIAGNOSIS — I472 Ventricular tachycardia, unspecified: Secondary | ICD-10-CM | POA: Diagnosis present

## 2022-05-07 DIAGNOSIS — I513 Intracardiac thrombosis, not elsewhere classified: Secondary | ICD-10-CM | POA: Diagnosis present

## 2022-05-07 DIAGNOSIS — I2510 Atherosclerotic heart disease of native coronary artery without angina pectoris: Secondary | ICD-10-CM | POA: Diagnosis present

## 2022-05-07 DIAGNOSIS — I25118 Atherosclerotic heart disease of native coronary artery with other forms of angina pectoris: Secondary | ICD-10-CM | POA: Diagnosis present

## 2022-05-07 DIAGNOSIS — R7303 Prediabetes: Secondary | ICD-10-CM | POA: Diagnosis present

## 2022-05-07 DIAGNOSIS — I493 Ventricular premature depolarization: Secondary | ICD-10-CM | POA: Diagnosis present

## 2022-05-07 DIAGNOSIS — N1831 Chronic kidney disease, stage 3a: Secondary | ICD-10-CM | POA: Diagnosis present

## 2022-05-07 LAB — PROTIME-INR
INR: 3.7 — ABNORMAL HIGH (ref 0.8–1.2)
Prothrombin Time: 36.3 seconds — ABNORMAL HIGH (ref 11.4–15.2)

## 2022-05-07 LAB — CBC WITH DIFFERENTIAL/PLATELET
Abs Immature Granulocytes: 0.01 10*3/uL (ref 0.00–0.07)
Basophils Absolute: 0 10*3/uL (ref 0.0–0.1)
Basophils Relative: 1 %
Eosinophils Absolute: 0.2 10*3/uL (ref 0.0–0.5)
Eosinophils Relative: 2 %
HCT: 43.5 % (ref 39.0–52.0)
Hemoglobin: 13.7 g/dL (ref 13.0–17.0)
Immature Granulocytes: 0 %
Lymphocytes Relative: 30 %
Lymphs Abs: 2.1 10*3/uL (ref 0.7–4.0)
MCH: 31.7 pg (ref 26.0–34.0)
MCHC: 31.5 g/dL (ref 30.0–36.0)
MCV: 100.7 fL — ABNORMAL HIGH (ref 80.0–100.0)
Monocytes Absolute: 1.1 10*3/uL — ABNORMAL HIGH (ref 0.1–1.0)
Monocytes Relative: 16 %
Neutro Abs: 3.6 10*3/uL (ref 1.7–7.7)
Neutrophils Relative %: 51 %
Platelets: 221 10*3/uL (ref 150–400)
RBC: 4.32 MIL/uL (ref 4.22–5.81)
RDW: 14.4 % (ref 11.5–15.5)
WBC: 7 10*3/uL (ref 4.0–10.5)
nRBC: 0 % (ref 0.0–0.2)

## 2022-05-07 LAB — COMPREHENSIVE METABOLIC PANEL
ALT: 24 U/L (ref 0–44)
AST: 46 U/L — ABNORMAL HIGH (ref 15–41)
Albumin: 3.3 g/dL — ABNORMAL LOW (ref 3.5–5.0)
Alkaline Phosphatase: 62 U/L (ref 38–126)
Anion gap: 10 (ref 5–15)
BUN: 14 mg/dL (ref 8–23)
CO2: 16 mmol/L — ABNORMAL LOW (ref 22–32)
Calcium: 8.1 mg/dL — ABNORMAL LOW (ref 8.9–10.3)
Chloride: 111 mmol/L (ref 98–111)
Creatinine, Ser: 1.47 mg/dL — ABNORMAL HIGH (ref 0.61–1.24)
GFR, Estimated: 52 mL/min — ABNORMAL LOW (ref >60)
Glucose, Bld: 223 mg/dL — ABNORMAL HIGH (ref 70–99)
Potassium: 4.5 mmol/L (ref 3.5–5.1)
Sodium: 137 mmol/L (ref 135–145)
Total Bilirubin: 0.7 mg/dL (ref 0.3–1.2)
Total Protein: 6.6 g/dL (ref 6.5–8.1)

## 2022-05-07 LAB — BASIC METABOLIC PANEL
Anion gap: 14 (ref 5–15)
BUN: 16 mg/dL (ref 8–23)
CO2: 22 mmol/L (ref 22–32)
Calcium: 9 mg/dL (ref 8.9–10.3)
Chloride: 106 mmol/L (ref 98–111)
Creatinine, Ser: 1.56 mg/dL — ABNORMAL HIGH (ref 0.61–1.24)
GFR, Estimated: 48 mL/min — ABNORMAL LOW (ref >60)
Glucose, Bld: 158 mg/dL — ABNORMAL HIGH (ref 70–99)
Potassium: 3.9 mmol/L (ref 3.5–5.1)
Sodium: 142 mmol/L (ref 135–145)

## 2022-05-07 LAB — MAGNESIUM
Magnesium: 2.1 mg/dL (ref 1.7–2.4)
Magnesium: 1.9 mg/dL (ref 1.7–2.4)

## 2022-05-07 LAB — LACTIC ACID, PLASMA
Lactic Acid, Venous: 3 mmol/L (ref 0.5–1.9)
Lactic Acid, Venous: 1 mmol/L (ref 0.5–1.9)

## 2022-05-07 LAB — RESP PANEL BY RT-PCR (FLU A&B, COVID) ARPGX2
Influenza A by PCR: NEGATIVE
Influenza B by PCR: NEGATIVE
SARS Coronavirus 2 by RT PCR: NEGATIVE

## 2022-05-07 LAB — TROPONIN I (HIGH SENSITIVITY)
Troponin I (High Sensitivity): 21 ng/L — ABNORMAL HIGH (ref <18)
Troponin I (High Sensitivity): 95 ng/L — ABNORMAL HIGH (ref <18)

## 2022-05-07 LAB — BRAIN NATRIURETIC PEPTIDE: B Natriuretic Peptide: 1303.7 pg/mL — ABNORMAL HIGH (ref 0.0–100.0)

## 2022-05-07 MED ORDER — ISOSORBIDE MONONITRATE ER 60 MG PO TB24
120.0000 mg | ORAL_TABLET | Freq: Every day | ORAL | Status: DC
  Administered 2022-05-07 – 2022-05-08 (×2): 120 mg via ORAL
  Filled 2022-05-07: qty 4
  Filled 2022-05-07: qty 2

## 2022-05-07 MED ORDER — CARVEDILOL 25 MG PO TABS
25.0000 mg | ORAL_TABLET | Freq: Two times a day (BID) | ORAL | Status: DC
  Administered 2022-05-07 – 2022-05-08 (×3): 25 mg via ORAL
  Filled 2022-05-07 (×4): qty 1

## 2022-05-07 MED ORDER — SPIRONOLACTONE 12.5 MG HALF TABLET
12.5000 mg | ORAL_TABLET | ORAL | Status: DC
  Administered 2022-05-07 – 2022-05-09 (×2): 12.5 mg via ORAL
  Filled 2022-05-07 (×2): qty 1

## 2022-05-07 MED ORDER — FUROSEMIDE 10 MG/ML IJ SOLN
40.0000 mg | Freq: Once | INTRAMUSCULAR | Status: AC
  Administered 2022-05-07: 40 mg via INTRAVENOUS
  Filled 2022-05-07: qty 4

## 2022-05-07 MED ORDER — DAPAGLIFLOZIN PROPANEDIOL 10 MG PO TABS
10.0000 mg | ORAL_TABLET | Freq: Every day | ORAL | Status: DC
  Administered 2022-05-07 – 2022-05-10 (×4): 10 mg via ORAL
  Filled 2022-05-07 (×4): qty 1

## 2022-05-07 MED ORDER — SODIUM CHLORIDE 0.9% FLUSH
3.0000 mL | Freq: Two times a day (BID) | INTRAVENOUS | Status: DC
  Administered 2022-05-07 – 2022-05-10 (×6): 3 mL via INTRAVENOUS

## 2022-05-07 MED ORDER — SODIUM CHLORIDE 0.9 % IV SOLN
250.0000 mL | INTRAVENOUS | Status: DC | PRN
  Administered 2022-05-08: 250 mL via INTRAVENOUS

## 2022-05-07 MED ORDER — GUAIFENESIN 100 MG/5ML PO LIQD
5.0000 mL | ORAL | Status: DC | PRN
  Administered 2022-05-07 – 2022-05-10 (×5): 5 mL via ORAL
  Filled 2022-05-07 (×5): qty 10

## 2022-05-07 MED ORDER — ONDANSETRON HCL 4 MG/2ML IJ SOLN: 4.0000 mg | Freq: Four times a day (QID) | INTRAMUSCULAR | Status: DC | PRN

## 2022-05-07 MED ORDER — ATORVASTATIN CALCIUM 80 MG PO TABS
80.0000 mg | ORAL_TABLET | Freq: Every day | ORAL | Status: DC
  Administered 2022-05-07 – 2022-05-10 (×4): 80 mg via ORAL
  Filled 2022-05-07: qty 1
  Filled 2022-05-07: qty 2
  Filled 2022-05-07 (×3): qty 1

## 2022-05-07 MED ORDER — SACUBITRIL-VALSARTAN 49-51 MG PO TABS
1.0000 | ORAL_TABLET | Freq: Two times a day (BID) | ORAL | Status: DC
  Administered 2022-05-08 (×2): 1 via ORAL
  Filled 2022-05-07 (×3): qty 1

## 2022-05-07 MED ORDER — ACETAMINOPHEN 325 MG PO TABS: 650.0000 mg | ORAL_TABLET | ORAL | Status: DC | PRN

## 2022-05-07 MED ORDER — SACUBITRIL-VALSARTAN 24-26 MG PO TABS
1.0000 | ORAL_TABLET | Freq: Two times a day (BID) | ORAL | Status: AC
  Administered 2022-05-07: 1 via ORAL
  Filled 2022-05-07: qty 1

## 2022-05-07 MED ORDER — WARFARIN - PHARMACIST DOSING INPATIENT: Freq: Every day | Status: DC

## 2022-05-07 MED ORDER — SODIUM CHLORIDE 0.9% FLUSH: 3.0000 mL | INTRAVENOUS | Status: DC | PRN

## 2022-05-07 NOTE — ED Notes (Signed)
Notified Dr. Sherry Ruffing BP trending softer [id number], [id number]. No orders at this time. Patient also stating that his blood pressure does trend lower.

## 2022-05-07 NOTE — Progress Notes (Signed)
ANTICOAGULATION CONSULT NOTE - Initial Consult  Pharmacy Consult for Warfarin Indication: Mural thrombus  No Known Allergies  Patient Measurements:     Vital Signs: Temp: 97.6 F (36.4 C) (11/12 1127) Temp Source: Oral (11/12 1127) BP: 122/72 (11/12 1200) Pulse Rate: 73 (11/12 1100)  Labs: Recent Labs    05/07/22 0742 05/07/22 0930  HGB 13.7  --   HCT 43.5  --   PLT 221  --   LABPROT 36.3*  --   INR 3.7*  --   CREATININE 1.47*  --   TROPONINIHS 21* 95*    CrCl cannot be calculated (Unknown ideal weight.).   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

## 2022-05-07 NOTE — ED Provider Notes (Signed)
[facility] EMERGENCY DEPARTMENT Provider Note   CSN: [id number] Arrival date & time: 05/07/22  0240     History  No chief complaint on file.   Steven [name] is a 66 y.o. male.  The history is provided by the patient and medical records. No language interpreter was used.  Shortness of Breath Severity:  Severe Onset quality:  Gradual Duration:  1 day Timing:  Constant Progression:  Worsening Chronicity:  New Context: URI   Relieved by:  Nothing Worsened by:  Exertion Ineffective treatments:  None tried Associated symptoms: cough   Associated symptoms: no abdominal pain, no chest pain, no diaphoresis, no fever, no headaches, no hemoptysis, no rash, no sputum production, no syncope, no vomiting and no wheezing   Risk factors: no hx of PE/DVT        Home Medications Prior to Admission medications   Medication Sig Start Date End Date Taking? Authorizing Provider  amLODipine (NORVASC) 2.5 MG tablet Take 2.5 mg by mouth daily.    [provider]  atorvastatin (LIPITOR) 80 MG tablet Take 80 mg by mouth daily. 02/21/21   [provider]  carvedilol (COREG) 25 MG tablet TAKE 1 TABLET BY MOUTH TWICE DAILY WITH A MEAL Patient taking differently: Take 25 mg by mouth 2 (two) times daily with a meal. 07/12/21   Croitoru, Mihai, MD  Cholecalciferol (VITAMIN D) 125 MCG (5000 UT) CAPS Take 5,000 Units by mouth daily.    [provider]  dapagliflozin propanediol (FARXIGA) 10 MG TABS tablet Take 10 mg by mouth daily.    [provider]  furosemide (LASIX) 20 MG tablet TAKE 1 TABLET BY MOUTH ONCE DAILY . APPOINTMENT REQUIRED FOR FUTURE REFILLS 11/07/21   Troy Sine, MD  isosorbide mononitrate (IMDUR) 120 MG 24 hr tablet Take 1 tablet (120 mg total) by mouth daily. 02/20/22   Croitoru, Mihai, MD  Multiple Vitamin (MULTIVITAMIN) tablet Take 1 tablet by mouth daily.    [provider]  nitroGLYCERIN (NITROSTAT) 0.4 MG SL tablet  Place 1 tablet (0.4 mg total) under the tongue every 5 (five) minutes as needed. Usual dose is q 5 minutes x 3 doses. 07/27/21   Troy Sine, MD  Pancrelipase, Lip-Prot-Amyl, (ZENPEP PO) Take 1-4 tablets by mouth daily.    [provider]  sacubitril-valsartan (ENTRESTO) 24-26 MG Take 1 tablet by mouth 2 (two) times daily. 11/04/21   Croitoru, Mihai, MD  spironolactone (ALDACTONE) 25 MG tablet Take 0.5 tablets (12.5 mg total) by mouth every other day. TAKE EOD ON EVEN DAYS 04/14/22   Troy Sine, MD  tamsulosin (FLOMAX) 0.4 MG CAPS capsule Take 0.4 mg by mouth at bedtime.    [provider]  warfarin (COUMADIN) 5 MG tablet 1/2 TO 1 (ONE-HALF TO ONE) ONCE DAILY AS DIRECTED BY THE COUMADIN CLINIC 11/17/21   Troy Sine, MD      Allergies    Patient has no known allergies.    Review of Systems   Review of Systems  Constitutional:  Positive for fatigue. Negative for chills, diaphoresis and fever.  HENT:  Positive for congestion and rhinorrhea.   Respiratory:  Positive for cough and shortness of breath. Negative for hemoptysis, sputum production, chest tightness, wheezing and stridor.   Cardiovascular:  Positive for leg swelling. Negative for chest pain, palpitations and syncope.  Gastrointestinal:  Negative for abdominal pain, constipation, diarrhea, nausea and vomiting.  Genitourinary:  Negative for dysuria.  Musculoskeletal:  Negative for back  pain.  Skin:  Negative for rash and wound.  Neurological:  Negative for light-headedness, numbness and headaches.  Psychiatric/Behavioral:  Negative for agitation.   All other systems reviewed and are negative.   Physical Exam Updated Vital Signs There were no vitals taken for this visit. Physical Exam Vitals and nursing note reviewed.  Constitutional:      General: He is in acute distress.     Appearance: He is well-developed. He is ill-appearing. He is not toxic-appearing or diaphoretic.  HENT:     Head:  Normocephalic and atraumatic.     Nose: No congestion or rhinorrhea.     Mouth/Throat:     Mouth: Mucous membranes are moist.     Pharynx: No oropharyngeal exudate or posterior oropharyngeal erythema.  Eyes:     Extraocular Movements: Extraocular movements intact.     Conjunctiva/sclera: Conjunctivae normal.     Pupils: Pupils are equal, round, and reactive to light.  Cardiovascular:     Rate and Rhythm: Normal rate and regular rhythm.     Heart sounds: No murmur heard. Pulmonary:     Effort: Respiratory distress present.     Breath sounds: No stridor. Rhonchi and rales present. No wheezing.  Chest:     Chest wall: No tenderness.  Abdominal:     Palpations: Abdomen is soft.     Tenderness: There is no abdominal tenderness. There is no guarding or rebound.  Musculoskeletal:        General: No swelling or tenderness.     Cervical back: Neck supple. No tenderness.     Right lower leg: Edema present.     Left lower leg: Edema present.  Skin:    General: Skin is warm and dry.     Capillary Refill: Capillary refill takes less than 2 seconds.     Findings: No erythema.  Neurological:     Mental Status: He is alert. Mental status is at baseline.  Psychiatric:        Mood and Affect: Mood normal.     ED Results / Procedures / Treatments   Labs (all labs ordered are listed, but only abnormal results are displayed) Labs Reviewed  CBC WITH DIFFERENTIAL/PLATELET - Abnormal; Notable for the following components:      Result Value   MCV 100.7 (*)    Monocytes Absolute 1.1 (*)    All other components within normal limits  COMPREHENSIVE METABOLIC PANEL - Abnormal; Notable for the following components:   CO2 16 (*)    Glucose, Bld 223 (*)    Creatinine, Ser 1.47 (*)    Calcium 8.1 (*)    Albumin 3.3 (*)    AST 46 (*)    GFR, Estimated 52 (*)    All other components within normal limits  BRAIN NATRIURETIC PEPTIDE - Abnormal; Notable for the following components:   B Natriuretic  Peptide 1,303.7 (*)    All other components within normal limits  PROTIME-INR - Abnormal; Notable for the following components:   Prothrombin Time 36.3 (*)    INR 3.7 (*)    All other components within normal limits  LACTIC ACID, PLASMA - Abnormal; Notable for the following components:   Lactic Acid, Venous 3.0 (*)    All other components within normal limits  TROPONIN I (HIGH SENSITIVITY) - Abnormal; Notable for the following components:   Troponin I (High Sensitivity) 21 (*)    All other components within normal limits  TROPONIN I (HIGH SENSITIVITY) - Abnormal; Notable for the  following components:   Troponin I (High Sensitivity) 95 (*)    All other components within normal limits  RESP PANEL BY RT-PCR (FLU A&B, COVID) ARPGX2  MAGNESIUM  LACTIC ACID, PLASMA    EKG EKG Interpretation  Date/Time:  Sunday May 07 2022 07:32:11 EST Ventricular Rate:  76 PR Interval:  177 QRS Duration: 109 QT Interval:  369 QTC Calculation: 415 R Axis:   -73 Text Interpretation: Sinus rhythm Ventricular premature complex Left anterior fascicular block Anterior infarct, old Nonspecific T abnormalities, lateral leads when compared to prior, similar appearance with a new PVC. No STEMI Confirmed by Antony Blackbird [phone]) on 05/07/2022 7:52:16 AM  Radiology DG Chest Portable 1 View  Result Date: 05/07/2022 CLINICAL DATA:  Cough, shortness of breath, hypoxia EXAM: PORTABLE CHEST 1 VIEW COMPARISON:  Previous studies including the examination of 03/15/2021 FINDINGS: Transverse diameter of heart is increased. There is diffuse increased interstitial and alveolar densities in parahilar regions and lower lung fields, more so on the left side. There is blunting of both lateral CP angles. There is no pneumothorax. Pacemaker/defibrillator battery is seen in left infraclavicular region. Calcification is seen in the pericardium. IMPRESSION: Diffuse increase in alveolar densities in parahilar regions and lower  lung fields suggest pulmonary edema. Possibility of underlying pneumonia is not excluded. Small bilateral pleural effusions, more so on the left side. Electronically Signed   By: Elmer Picker M.D.   On: 05/07/2022 08:10    Procedures Procedures    CRITICAL CARE Performed by: Gwenyth Allegra [place] Nordquist Total critical care time: 40 minutes Critical care time was exclusive of separately billable procedures and treating other patients. Critical care was necessary to treat or prevent imminent or life-threatening deterioration. Critical care was time spent personally by me on the following activities: development of treatment plan with patient and/or surrogate as well as nursing, discussions with consultants, evaluation of patient's response to treatment, examination of patient, obtaining history from patient or surrogate, ordering and performing treatments and interventions, ordering and review of laboratory studies, ordering and review of radiographic studies, pulse oximetry and re-evaluation of patient's condition.   Medications Ordered in ED Medications  atorvastatin (LIPITOR) tablet 80 mg (has no administration in time range)  carvedilol (COREG) tablet 25 mg (has no administration in time range)  isosorbide mononitrate (IMDUR) 24 hr tablet 120 mg (has no administration in time range)  sacubitril-valsartan (ENTRESTO) 24-26 mg per tablet (has no administration in time range)  spironolactone (ALDACTONE) tablet 12.5 mg (has no administration in time range)  dapagliflozin propanediol (FARXIGA) tablet 10 mg (has no administration in time range)  sodium chloride flush (NS) 0.9 % injection 3 mL (has no administration in time range)  sodium chloride flush (NS) 0.9 % injection 3 mL (has no administration in time range)  0.9 %  sodium chloride infusion (has no administration in time range)  acetaminophen (TYLENOL) tablet 650 mg (has no administration in time range)  ondansetron (ZOFRAN) injection 4  mg (has no administration in time range)  furosemide (LASIX) injection 40 mg (has no administration in time range)  sacubitril-valsartan (ENTRESTO) 49-51 mg per tablet (has no administration in time range)  Warfarin - Pharmacist Dosing Inpatient (has no administration in time range)  furosemide (LASIX) injection 40 mg (40 mg Intravenous Given 05/07/22 1003)    ED Course/ Medical Decision Making/ A&P                           Medical Decision  Making Amount and/or Complexity of Data Reviewed Labs: ordered. Radiology: ordered.  Risk Prescription drug management. Decision regarding hospitalization.    Steven [name] is a 66 y.o. male with a past medical history significant for CAD with previous MI, atrial fibrillation on Coumadin therapy, previous subdural hematoma, CKD, CHF last EF 20 to 25% in 2021, ICD placement, and previous VTE who presents with respiratory distress.  According to patient, he had increasing shortness of breath for the last week or so and has had worsened peripheral edema.  Patient does not use oxygen at home and over the last 10 hours or so, he has had rapidly worsening breathing.  He has had exertional shortness of breath although denies chest pain.  He reports he has had some cough for the last few days but denies any fevers or chills.  He has had some congestion and rhinorrhea.  Denies any sick contacts or COVID or flu exposures.  Per EMS, the fire department found the patient's oxygen saturation in the low 80s on room air.  He is now on CPAP with EMS and is now on BiPAP on arrival.  Patient's oxygen saturations were in the 80s on CPAP on arrival but now on BiPAP it is in the 90s.  He is breathing more comfortably.  EMS gave the patient albuterol and Atrovent when they heard some wheezing however the wheezing has resolved and now it sounds primarily like rales and rhonchi diffusely.  I do not appreciate a murmur initially.  Chest and abdomen nontender.  Patient has good  pulses in extremities.  Legs are slightly MS bilaterally which patient reports has gradually been worsening.  Denies any unilateral symptoms and denies any pleuritic discomfort.  He reports his shortness breath is exertional and he cannot lay flat.  Clinically I am concerned about heart for exacerbation in the setting of possible URI.  We will get chest x-ray, labs, as well as COVID and flu swab.  We will also assess his heart with troponin and BNP.  Anticipate discussion with cardiology after work-up is further along.  It appears his cardiologist Dr. Claiborne Billings.  We will attempt to interrogate his ICD.  His blood pressure is 829 systolic and EMS reported the most elevated blood pressure they had was in the 130s thus I do not think this is a flash pulmonary edema in the setting of hypertensive emergency.  Anticipate he will need diuresis but we will first assess his kidney function and monitor his blood pressure closely.  He will need admission after work-up is completed.   8:07 AM Was just informed by nursing that the patient's device reported that the patient has had fluid building up for the last 6 days since November 7.  He also has had several episodes of nonsustained V. tach over the last few months.  Spoke to cardiology who will come to the bedside.  They requested 1 dose of his IV Lasix and they will see to determine if he needs pressors to help achieve diuresis.  Patient was able to get weaned from the BiPAP and is appearing better.  Blood pressure has improved.  He will be admitted to cardiology service for further management.        Final Clinical Impression(s) / ED Diagnoses Final diagnoses:  Acute respiratory failure with hypoxia (HCC)  Hypervolemia, unspecified hypervolemia type   Clinical Impression: 1. Acute respiratory failure with hypoxia ([place])   2. Hypervolemia, unspecified hypervolemia type     Disposition: Admit  This  note was prepared with assistance of Therapist, sports. Occasional wrong-word or sound-a-like substitutions may have occurred due to the inherent limitations of voice recognition software.      Anyiah Coverdale, Gwenyth Allegra, MD 05/07/22 1231

## 2022-05-07 NOTE — Progress Notes (Addendum)
Per previous conversation with patient and Cardiology MD, Bipap removed after 4 hours and pt placed on 3L/[place]. Pt tolerating well, PO meds given and pt provided with Chicken noodle soup and small amount of rice and green beans from previous dinner tray. Will continue to monitor closely.   10 Pt with increased work of breathing and sats ranging 89-91%, discussed with Sarah RRT and pt placed on HFNC titrated up to 8L. Pt much more comfortable and sats maintaining 95%. Discussed with patient placing him back on BIPAP after 30 minutes due to eating and taking meds and pt is agreeable. Will continue to monitor Jessie Foot, RN  2315 Pt RR 19 and maintaining Sats 96-100% on 8L HFNC. Pt actually laying on his side resting quietly. Will leave on HFNC 8L and continue to monitor for now. Jessie Foot, RN

## 2022-05-07 NOTE — Progress Notes (Signed)
Brief update:  Shortly following arrival to the floor, noted to be in progressive respiratory distress with O2 desaturations persistently in the 80s with good SPO2 waveform.  Had been on BiPAP in the emergency room with good response in respiratory distress and improvement in O2 sats.  Initially unable to start BiPAP due to shortage of BiPAP equipment on the floor.  Rapid response was called and BiPAP was obtained from the ED.  Nearly immediately after BiPAP initiation, patient felt relief of his respiratory distress with dramatic improvement in his oxygen saturation.  Reviewed chest x-ray from earlier today which shows prominent pulmonary edema.  Review of ins and outs shows good urine output in response to the Lasix that he received this afternoon.  SBP in the 130s-140s when in respiratory distress.  Do not think he needs nitroglycerin at this time as I suspect his BP to improve with BiPAP.  We will plan for 4 hours of NIV.  Discussed with primary care nurse that it is okay for him to receive his p.m. medications via very cessation of BiPAP.  May require continuous BiPAP overnight if once again fails wean of BiPAP this evening.  May also require an additional dose of Lasix this evening.  Adelina Mings, MD Cardiology Fellow

## 2022-05-07 NOTE — ED Triage Notes (Signed)
Patient arrived by [facility] with complaint of increasing SOB and congestion the past 10 hours. Patient found by fire with sats low 80s. Patient received the following- Albuterol 10, atrovent .5 and sl ntg .4.  On arrival patient was on c-pap, alert and oriented. Denies cp. Has hx of CHF and taking daily meds as directed. Denies fever

## 2022-05-07 NOTE — ED Notes (Signed)
Pacemaker interrogated. 

## 2022-05-07 NOTE — Progress Notes (Signed)
Patient placed on BIPAP per MD request. Patient is tolerating well. No complications. RT will continue to monitor.

## 2022-05-07 NOTE — H&P (Addendum)
Cardiology Admission History and Physical   Patient ID: Steven [name] MRN: [id number]; DOB: [birth year]   Admission date: 05/07/2022  PCP:  Macarthur Critchley, MD   [place] [facility] Cardiologist:  Shelva Majestic, MD  Electrophysiologist:  Sanda Klein, MD       Chief Complaint:  SOB  Patient Profile:   Steven [name] is a 66 y.o. 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  Patient had a large out of the hospital anterior MI in January 2012, he presented after a 3-hour delay to the hospital.  He eventually underwent successful PCI of LAD.  Afterward, due to persistently low EF, he underwent ICD implantation.  Interrogation of his device in the past has shown nonsustained VT, carvedilol was further uptitrated.  Echocardiogram in February 2015 showed EF persistently low at 20 to 25%, akinesis of the entire anteroseptal myocardium and apical region, medium sized minimal thrombus.  His Plavix was discontinued and that he was started on Coumadin therapy.  In August 2015, he tripped and hit his head on a bar that resulted in subdural hematoma.  Aspirin and Coumadin were initially held, they were later restarted once cleared by neurosurgery.  EF was 15 to 20% on echocardiogram with no evidence of thrombus at the time.  After his previous device reached ERI, he underwent dual-chamber ICD device change out on 02/24/2019 by Dr. Sallyanne Kuster.  Due to chronic anginal symptom, he was placed on both Imdur and amlodipine.  Repeat echocardiogram in July  2021 continues to show a low EF of 20 to 25%.  Patient was most recently seen by Dr. Claiborne Billings on 04/14/2022 at which time he was doing well.  His blood pressure was low on that day at 88/60, spironolactone was reduced to 12.5 mg daily.  proBNP obtained in the office was elevated to 587.  In the past few days, he mentions he has been having progressive dyspnea with exertion and a cough.  He has also noticed weight gain and lower extremity edema.  He eventually sought medical attention at [facility] ED.  COVID and influenza test negative.  Initial lactic acid was 3.0.  Other significant lab work include creatinine of 1.47, albumin 3.3, glucose 223.  BNP has increased to 1303.7.  Chest x-ray showed diffuse increasing alveolar density in the perihilar region and lower left lung field suggestive of pulmonary edema, however cannot exclude pneumonia.  Patient was placed on BiPAP as O2 saturation obtained by fire department was in the 80s.  Repeat lactic acid 2 hours later shows lactic acid has returned back down to normal of 1.0.  Serial troponin was 21-->95.  He has been given 40 mg IV Lasix.  Device interrogation shows 5 episode of nonsustained VT, no device therapy required, possible OptiVol fluid accumulation since November 7.  Patient's activity has been less than 1 hour/day for the past 2 weeks.   Past Medical History:  Diagnosis Date   Acute MI, anterior wall (HCC)    CAD (coronary artery disease)    2D ECHO, 07/13/2011 -  EF <25%, LV moderatelty dilated, LA moderately dilatedLEXISCAN, 12/14/2011 - moderate-severe perfusion defect seen in the basal anteroseptal, mid anterior, apicacl anterior, apical, apical inferior, and apical lateral regions, post-stress EF 25%, new EKG changes from baseline abnormalities   Hypertension 08/08/2021   Inguinal hernia, left    Pre-diabetes     Past Surgical History:  Procedure Laterality Date   CARDIAC CATHETERIZATION  11/25/2010   Predilation balloon-Apex monorail 2x35m,  Cutting balloon-2.25x170m resulting in a reduction of 100% stenosis down to less than 10%   CARDIAC CATHETERIZATION  07/12/2010   LAD stented with a 3.5x2459mare-metal Veriflex stent resulting in a reduction of 100% lesion to 0%   CARDIAC DEFIBRILLATOR PLACEMENT  03/02/2011   Medtronic Protecta XT DR, model #D3#[id number] #PS#[id number]  GOLD SEED IMPLANT N/A 07/21/2021   Procedure: GOLD SEED IMPLANT;  Surgeon: BorRaynelle BringD;  Location: [facility];  Service: Urology;  Laterality: N/A;  NEEDS 30 MIN   ICD GENERATOR CHANGEOUT N/A 02/24/2019   Procedure: ICD GENERATOR CHANGEOUT;  Surgeon: CroSanda KleinD;  Location: [facility];  Service: Cardiovascular;  Laterality: N/A;   PACEMAKER PLACEMENT  07/14/2010   Temporary placement of pacemaker, if rhythm issue continues will need a permanent device   SPACE OAR INSTILLATION N/A 07/21/2021   Procedure: SPACE OAR INSTILLATION;  Surgeon: BorRaynelle BringD;  Location: [facility];  Service: Urology;  Laterality: N/A;     Medications Prior to Admission: Prior to Admission medications   Medication Sig Start Date End Date Taking? Authorizing Provider  amLODipine (NORVASC) 2.5 MG tablet Take 2.5 mg by mouth daily.    [provider]  atorvastatin (LIPITOR) 80 MG tablet Take 80 mg by mouth daily. 02/21/21   [provider]  carvedilol (COREG) 25 MG tablet TAKE 1 TABLET BY MOUTH TWICE DAILY WITH A MEAL Patient taking differently: Take 25 mg by mouth 2 (two) times daily with a meal. 07/12/21   Croitoru, Mihai, MD  Cholecalciferol (VITAMIN D) 125 MCG (5000 UT) CAPS Take 5,000 Units by mouth daily.    [provider]  dapagliflozin propanediol (FARXIGA) 10 MG TABS tablet Take 10 mg by mouth daily.    [provider]  furosemide (LASIX) 20 MG tablet TAKE 1 TABLET BY MOUTH ONCE DAILY . APPOINTMENT REQUIRED FOR FUTURE REFILLS 11/07/21   KelTroy SineD  isosorbide mononitrate (IMDUR) 120 MG 24 hr tablet Take 1 tablet (120 mg  total) by mouth daily. 02/20/22   Croitoru, Mihai, MD  Multiple Vitamin (MULTIVITAMIN) tablet Take 1 tablet by mouth daily.    [provider]  nitroGLYCERIN (NITROSTAT) 0.4 MG SL tablet Place 1 tablet (0.4 mg total) under the tongue every 5 (five) minutes as needed. Usual dose is q 5 minutes x 3 doses. 07/27/21   KelTroy SineD  Pancrelipase, Lip-Prot-Amyl, (ZENPEP PO) Take 1-4 tablets by mouth daily.    [provider]  sacubitril-valsartan (ENTRESTO) 24-26 MG Take 1 tablet by mouth 2 (two) times daily. 11/04/21   Croitoru, Mihai, MD  spironolactone (ALDACTONE) 25 MG tablet Take 0.5 tablets (12.5 mg total) by mouth every other day. TAKE EOD ON EVEN DAYS 04/14/22   KelTroy SineD  tamsulosin (FLOMAX) 0.4 MG CAPS capsule Take 0.4 mg by mouth at bedtime.    [provider]  warfarin (COUMADIN) 5 MG tablet 1/2 TO 1 (ONE-HALF TO ONE) ONCE DAILY AS DIRECTED BY THE COUMADIN CLINIC 11/17/21   KelTroy SineD  Allergies:   No Known Allergies  Social History:   Social History   Socioeconomic History   Marital status: Married    Spouse name: Not on file   Number of children: Not on file   Years of education: Not on file   Highest education level: Not on file  Occupational History   Not on file  Tobacco Use   Smoking status: Former    Types: Cigarettes, Cigars   Smokeless tobacco: Never  Vaping Use   Vaping Use: Never used  Substance and Sexual Activity   Alcohol use: Yes    Alcohol/week: 2.0 - 3.0 standard drinks of alcohol    Types: 2 - 3 drink(s) per week   Drug use: No   Sexual activity: Not on file  Other Topics Concern   Not on file  Social History Narrative   Not on file   Social Determinants of Health   Financial Resource Strain: Not on file  Food Insecurity: Not on file  Transportation Needs: Not on file  Physical Activity: Not on file  Stress: Not on file  Social Connections: Not on file  Intimate Partner Violence: Not on file     Family History:   The patient's family history is not on file.    ROS:  Please see the history of present illness.  All other ROS reviewed and negative.     Physical Exam/Data:   Vitals:   05/07/22 0915 05/07/22 0930 05/07/22 1002 05/07/22 1055  BP: 93/64 98/67 108/70 (!) 117/94  Pulse: 62 64 64 70  Resp: 19 (!) '21 16 20  '$ Temp:      TempSrc:      SpO2: 100% 100% 100% 100%   No intake or output data in the 24 hours ending 05/07/22 1121    04/14/2022    2:19 PM 07/20/2021    1:21 PM 05/30/2021    8:01 AM  Last 3 Weights  Weight (lbs) 166 lb 162 lb 164 lb 9.6 oz  Weight (kg) 75.297 kg 73.483 kg 74.662 kg     There is no height or weight on file to calculate BMI.  General:  Well nourished, well developed, in no acute distress HEENT: normal Neck: no JVD Vascular: No carotid bruits; Distal pulses 2+ bilaterally   Cardiac:  normal S1, S2; RRR; no murmur  Lungs: Diminished breath sounds at bilateral bases of the lung,  pleural rub on left anterior aspect of chest  Abd: soft, nontender, no hepatomegaly  Ext: 1-2+ edema Musculoskeletal:  No deformities, BUE and BLE strength normal and equal Skin: warm and dry  Neuro:  CNs 2-12 intact, no focal abnormalities noted Psych:  Normal affect    EKG:  The ECG that was done on 05/07/2022 and was personally reviewed and demonstrates normal sinus rhythm, T wave inversion in the lateral leads, poor R wave progression in anterior leads.  Relevant CV Studies:  Echo 01/05/2020  1. Since the last study on 04/18/2017 remains very low at 20-25% with  apical aneurysm and no thrombus on Definity echocontrast images.   2. Left ventricular ejection fraction, by estimation, is 20 to 25%. The  left ventricle has severely decreased function. The left ventricle  demonstrates global hypokinesis. The left ventricular internal cavity size  was moderately dilated. Left  ventricular diastolic parameters are consistent with Grade I diastolic  dysfunction  (impaired relaxation).   3. Right ventricular systolic function is normal. The right ventricular  size is normal. There is normal pulmonary  artery systolic pressure. The  estimated right ventricular systolic pressure is 60.7 mmHg.   4. The mitral valve is normal in structure. Mild mitral valve  regurgitation. No evidence of mitral stenosis.   5. The aortic valve is normal in structure. Aortic valve regurgitation is  not visualized. No aortic stenosis is present.   6. The inferior vena cava is normal in size with greater than 50%  respiratory variability, suggesting right atrial pressure of 3 mmHg.   Laboratory Data:  High Sensitivity Troponin:   Recent Labs  Lab 05/07/22 0742 05/07/22 0930  TROPONINIHS 21* 95*      Chemistry Recent Labs  Lab 05/07/22 0742  NA 137  K 4.5  CL 111  CO2 16*  GLUCOSE 223*  BUN 14  CREATININE 1.47*  CALCIUM 8.1*  MG 2.1  GFRNONAA 52*  ANIONGAP 10    Recent Labs  Lab 05/07/22 0742  PROT 6.6  ALBUMIN 3.3*  AST 46*  ALT 24  ALKPHOS 62  BILITOT 0.7   Lipids No results for input(s): "CHOL", "TRIG", "HDL", "LABVLDL", "LDLCALC", "CHOLHDL" in the last 168 hours. Hematology Recent Labs  Lab 05/07/22 0742  WBC 7.0  RBC 4.32  HGB 13.7  HCT 43.5  MCV 100.7*  MCH 31.7  MCHC 31.5  RDW 14.4  PLT 221   Thyroid No results for input(s): "TSH", "FREET4" in the last 168 hours. BNP Recent Labs  Lab 05/07/22 0742  BNP 1,303.7*    DDimer No results for input(s): "DDIMER" in the last 168 hours.   Radiology/Studies:  DG Chest Portable 1 View  Result Date: 05/07/2022 CLINICAL DATA:  Cough, shortness of breath, hypoxia EXAM: PORTABLE CHEST 1 VIEW COMPARISON:  Previous studies including the examination of 03/15/2021 FINDINGS: Transverse diameter of heart is increased. There is diffuse increased interstitial and alveolar densities in parahilar regions and lower lung fields, more so on the left side. There is blunting of both lateral CP angles.  There is no pneumothorax. Pacemaker/defibrillator battery is seen in left infraclavicular region. Calcification is seen in the pericardium. IMPRESSION: Diffuse increase in alveolar densities in parahilar regions and lower lung fields suggest pulmonary edema. Possibility of underlying pneumonia is not excluded. Small bilateral pleural effusions, more so on the left side. Electronically Signed   By: Elmer Picker M.D.   On: 05/07/2022 08:10     Assessment and Plan:   Acute on chronic systolic heart failure  -Most recent ejection fraction 20 to 25% echocardiogram 01/05/2020.  Patient has a history of significant ischemic cardiomyopathy secondary to infarcted anterior wall since 2012.   -Last seen by Dr. Claiborne Billings on 04/14/2022, proBNP obtained at the time was mildly elevated to 587.  Patient complaining of increasing weight gain, shortness of breath, lower extremity edema for the past few days.  Device interrogation shows he has been accumulating fluid for the past 5 days.  BNP high at 1303.7.  - Continue Coreg, Farxiga, Imdur, Entresto, and spironolactone.  IV Lasix 40 mg twice a day. Discussed with Dr. Acie Fredrickson, stop amlodipine as a trial to see maybe we can increase entresto later.    -will hold off repeating echo given known infarcted anterior wall. Lactic acid was initially elevated in the setting of acute heart failure, however quickly normalized once the patient was placed on BiPAP therapy.  His symptom is improving, likely will come off of BiPAP soon.  Admit to cardiac telemetry, likely discharge in the next 24-48 hours after diuresis.  Elevated troponin: Troponin 21-->95.  Occurred in the setting of acute heart failure.  Denies any chest pain.  Continue to trend troponin, likely will hold off on additional ischemic work-up unless significant increase.  Possible URI: complains of some nasal congestion and coughing for the past 3 weeks. No fever. No elevation of WBC. Conservative management.   CAD:  Denies any recent chest pain.  Ischemic cardiomyopathy s/p Medtronic dual-chamber ICD: Last device change out in August 2020.  Device was interrogated by ED, 5 episode of nonsustained VT, no which lasted very long.  No therapy needed.  Patient has poor functional status, less than 1 hour of activity documented in the past 2 weeks.  History of VT: No sustained episode.  History of mural thrombus: on Coumadin  History of subdural hematoma: Resolved after fall and head trauma in 2015  CKD stage III  Hyperlipidemia: On Lipitor   Risk Assessment/Risk Scores:    TIMI Risk Score for Unstable Angina or Non-ST Elevation MI:   The patient's TIMI risk score is 4, which indicates a 20% risk of all cause mortality, new or recurrent myocardial infarction or need for urgent revascularization in the next 14 days.  New York Heart Association (NYHA) Functional Class NYHA Class IV     Severity of Illness: The appropriate patient status for this patient is OBSERVATION. Observation status is judged to be reasonable and necessary in order to provide the required intensity of service to ensure the patient's safety. The patient's presenting symptoms, physical exam findings, and initial radiographic and laboratory data in the context of their medical condition is felt to place them at decreased risk for further clinical deterioration. Furthermore, it is anticipated that the patient will be medically stable for discharge from the hospital within 2 midnights of admission.   For questions or updates, please contact [facility] Please consult [email or web address] for contact info under     Hilbert Corrigan, [place]  05/07/2022 11:21 AM    Attending Note:   The patient was seen and examined.  Agree with assessment and plan as noted above.  Changes made to the above note as needed.  Patient seen and independently examined with Almyra Deforest, [place] .   We discussed all aspects of the encounter. I agree with the  assessment and plan as stated above.     Acute on chronic CHF:   EF is 20-25%.   He admits to having some dietary discretion and may need a little more salt than usual. He is on low-dose Entresto, carvedilol, Farxiga, Imdur.  He is also on amlodipine.  I think he would do much better without the amlodipine.  This may allow his blood pressure to further increase his Entresto.  For the next day or so we will continue to diurese him.  He already feels quite a bit better after diuresing several liters and the emergency room.  B-natriuretic peptide is elevated but it should be noted that the Entresto causes elevation of BNP.  I do NOT think the BNP would be useful in  gauging  his volume status.  Anticipate starting him on Entresto 49-51 twice daily starting tomorrow. We will continue Lasix at least on an as-needed basis.  We will need to watch his blood pressure since it has been borderline low. If his blood pressure does remain low I would consider stopping his isosorbide.  He has   been on isosorbide and amlodipine for exertional angina I suspect a lot of his angina may be just heart failure related.  We are stopping his amlodipine .  We may hold imdur if he is hypotensive . I would favor spironolactone over Imdur if he tolerates.   2.  CAD :   s/p large ant. MI .  EF has been 20-25% for years   3.  Hx of mural thrombus: cont coumadin   4.  Hx of hyperlipidemia :  cont atorvastatin     I have spent a total of 40 minutes with patient reviewing hospital  notes , telemetry, EKGs, labs and examining patient as well as establishing an assessment and plan that was discussed with the patient.  > 50% of time was spent in direct patient care.    Thayer Headings, Brooke Bonito., MD, [facility] 05/07/2022, 12:03 PM [address]  [place] Pager [phone]

## 2022-05-07 NOTE — Progress Notes (Signed)
Received patient from the emergency department with 3L of oxygen. Patient's oxygen saturation when he first came in was at 89-91%. As the resource nurse was getting his admission history, his oxygen saturation went down to the low 80's.The resource nurse increased his oxygen to 5 liters but his oxygen saturation won't go up, she informed me of this and she got a non rebreather. I, contacted the respiratory therapist and paged the on call doctor. The on call MD said to put the patient back on the bipap which I relayed to the respiratory therapist. Rapid response has been contacted as well for additional assistance. Patient was placed on the non rebreather and his oxygen saturation was still maintaining in the 80's and the patient's work of breathing is becoming labored. On call MD called [place] back and he said that he will be up to check the patient. Respiratory therapist came in and placed the patient on the bipap. The MD came up and assessed the patient. The patient's oxygen saturation has started to go up and has felt better. MD said to continue with the bipap therapy for 4 hours. No other orders was given. Patient has settled down after the bipap was put on him and his oxygen saturation has stayed in the 90's. Currently patient's oxygen saturation is at 95%.

## 2022-05-07 NOTE — Progress Notes (Signed)
   05/07/22 1055  Therapy Vitals  Pulse Rate 70  Resp 20  BP (!) 117/94  Patient Position (if appropriate) Lying  MEWS Score/Color  MEWS Score 0  MEWS Score Color Green  Oxygen Therapy/Pulse Ox  O2 Device (S)  Nasal Cannula  O2 Therapy Oxygen  O2 Flow Rate (L/min) 4 L/min  SpO2 100 %   Pt was taken off BiPAP and transitioned to 3L  with no apparent complications. Pt states his breathing is much better. BiPAP at bedside on standby. RN aware.

## 2022-05-08 ENCOUNTER — Other Ambulatory Visit (HOSPITAL_COMMUNITY): Payer: Self-pay

## 2022-05-08 ENCOUNTER — Encounter (HOSPITAL_COMMUNITY): Payer: Self-pay | Admitting: Cardiovascular Disease

## 2022-05-08 DIAGNOSIS — I5023 Acute on chronic systolic (congestive) heart failure: Secondary | ICD-10-CM

## 2022-05-08 DIAGNOSIS — N1832 Chronic kidney disease, stage 3b: Secondary | ICD-10-CM | POA: Diagnosis present

## 2022-05-08 DIAGNOSIS — I5031 Acute diastolic (congestive) heart failure: Secondary | ICD-10-CM | POA: Diagnosis present

## 2022-05-08 DIAGNOSIS — I4891 Unspecified atrial fibrillation: Secondary | ICD-10-CM | POA: Diagnosis present

## 2022-05-08 DIAGNOSIS — Z7901 Long term (current) use of anticoagulants: Secondary | ICD-10-CM | POA: Diagnosis not present

## 2022-05-08 DIAGNOSIS — I513 Intracardiac thrombosis, not elsewhere classified: Secondary | ICD-10-CM | POA: Diagnosis present

## 2022-05-08 DIAGNOSIS — R7303 Prediabetes: Secondary | ICD-10-CM | POA: Diagnosis present

## 2022-05-08 DIAGNOSIS — I472 Ventricular tachycardia, unspecified: Secondary | ICD-10-CM | POA: Diagnosis present

## 2022-05-08 DIAGNOSIS — Z79899 Other long term (current) drug therapy: Secondary | ICD-10-CM | POA: Diagnosis not present

## 2022-05-08 DIAGNOSIS — Z1152 Encounter for screening for COVID-19: Secondary | ICD-10-CM | POA: Diagnosis not present

## 2022-05-08 DIAGNOSIS — I493 Ventricular premature depolarization: Secondary | ICD-10-CM | POA: Diagnosis present

## 2022-05-08 DIAGNOSIS — J9601 Acute respiratory failure with hypoxia: Secondary | ICD-10-CM | POA: Diagnosis present

## 2022-05-08 DIAGNOSIS — E785 Hyperlipidemia, unspecified: Secondary | ICD-10-CM | POA: Diagnosis present

## 2022-05-08 DIAGNOSIS — I130 Hypertensive heart and chronic kidney disease with heart failure and stage 1 through stage 4 chronic kidney disease, or unspecified chronic kidney disease: Secondary | ICD-10-CM | POA: Diagnosis present

## 2022-05-08 DIAGNOSIS — I252 Old myocardial infarction: Secondary | ICD-10-CM | POA: Diagnosis not present

## 2022-05-08 DIAGNOSIS — I255 Ischemic cardiomyopathy: Secondary | ICD-10-CM | POA: Diagnosis present

## 2022-05-08 DIAGNOSIS — Z95810 Presence of automatic (implantable) cardiac defibrillator: Secondary | ICD-10-CM | POA: Diagnosis not present

## 2022-05-08 DIAGNOSIS — J069 Acute upper respiratory infection, unspecified: Secondary | ICD-10-CM | POA: Diagnosis present

## 2022-05-08 DIAGNOSIS — R739 Hyperglycemia, unspecified: Secondary | ICD-10-CM | POA: Diagnosis present

## 2022-05-08 DIAGNOSIS — I2510 Atherosclerotic heart disease of native coronary artery without angina pectoris: Secondary | ICD-10-CM | POA: Diagnosis present

## 2022-05-08 DIAGNOSIS — I5021 Acute systolic (congestive) heart failure: Secondary | ICD-10-CM | POA: Diagnosis not present

## 2022-05-08 LAB — HEMOGLOBIN A1C
Hgb A1c MFr Bld: 6.4 % — ABNORMAL HIGH (ref 4.8–5.6)
Mean Plasma Glucose: 136.98 mg/dL

## 2022-05-08 LAB — BASIC METABOLIC PANEL
Anion gap: 10 (ref 5–15)
BUN: 14 mg/dL (ref 8–23)
CO2: 23 mmol/L (ref 22–32)
Calcium: 8.4 mg/dL — ABNORMAL LOW (ref 8.9–10.3)
Chloride: 104 mmol/L (ref 98–111)
Creatinine, Ser: 1.52 mg/dL — ABNORMAL HIGH (ref 0.61–1.24)
GFR, Estimated: 50 mL/min — ABNORMAL LOW (ref >60)
Glucose, Bld: 117 mg/dL — ABNORMAL HIGH (ref 70–99)
Potassium: 3.6 mmol/L (ref 3.5–5.1)
Sodium: 137 mmol/L (ref 135–145)

## 2022-05-08 LAB — PROTIME-INR
INR: 3.1 — ABNORMAL HIGH (ref 0.8–1.2)
Prothrombin Time: 32 seconds — ABNORMAL HIGH (ref 11.4–15.2)

## 2022-05-08 MED ORDER — ISOSORBIDE MONONITRATE ER 30 MG PO TB24: 30.0000 mg | ORAL_TABLET | Freq: Every day | ORAL | Status: DC

## 2022-05-08 MED ORDER — AZITHROMYCIN 250 MG PO TABS
500.0000 mg | ORAL_TABLET | Freq: Every day | ORAL | Status: AC
  Administered 2022-05-08: 500 mg via ORAL
  Filled 2022-05-08 (×2): qty 2

## 2022-05-08 MED ORDER — AZITHROMYCIN 250 MG PO TABS
250.0000 mg | ORAL_TABLET | Freq: Every day | ORAL | Status: DC
  Administered 2022-05-09 – 2022-05-10 (×2): 250 mg via ORAL
  Filled 2022-05-08 (×2): qty 1

## 2022-05-08 MED ORDER — WARFARIN SODIUM 2.5 MG PO TABS
2.5000 mg | ORAL_TABLET | Freq: Once | ORAL | Status: AC
  Administered 2022-05-08: 2.5 mg via ORAL
  Filled 2022-05-08: qty 1

## 2022-05-08 MED ORDER — FUROSEMIDE 10 MG/ML IJ SOLN
40.0000 mg | Freq: Two times a day (BID) | INTRAMUSCULAR | Status: DC
  Administered 2022-05-08: 40 mg via INTRAVENOUS
  Filled 2022-05-08 (×3): qty 4

## 2022-05-08 MED ORDER — MAGNESIUM SULFATE IN D5W 1-5 GM/100ML-% IV SOLN
1.0000 g | Freq: Once | INTRAVENOUS | Status: AC
  Administered 2022-05-08: 1 g via INTRAVENOUS
  Filled 2022-05-08: qty 100

## 2022-05-08 MED ORDER — POTASSIUM CHLORIDE CRYS ER 20 MEQ PO TBCR
40.0000 meq | EXTENDED_RELEASE_TABLET | Freq: Once | ORAL | Status: AC
  Administered 2022-05-08: 40 meq via ORAL
  Filled 2022-05-08 (×2): qty 2

## 2022-05-08 NOTE — Progress Notes (Signed)
Pt has rested comfortably with no obvious resp distress. HFNC decreased to 6L and pt continues to sat 95-99%. Will continue to monitor. Jessie Foot, RN

## 2022-05-08 NOTE — Progress Notes (Signed)
[place] for Warfarin Indication: Mural thrombus  No Known Allergies  Patient Measurements: Weight: 77.6 kg (171 lb 1.6 oz)   Vital Signs: Temp: 99.3 F (37.4 C) (11/13 0815) Temp Source: Oral (11/13 0815) BP: 119/80 (11/13 0815) Pulse Rate: 77 (11/13 0815)  Labs: Recent Labs    05/07/22 0742 05/07/22 0930 05/07/22 1801 05/08/22 0157  HGB 13.7  --   --   --   HCT 43.5  --   --   --   PLT 221  --   --   --   LABPROT 36.3*  --   --  32.0*  INR 3.7*  --   --  3.1*  CREATININE 1.47*  --  1.56* 1.52*  TROPONINIHS 21* 95*  --   --      Estimated Creatinine Clearance: 48.7 mL/min (A) (by C-G formula based on SCr of 1.52 mg/dL (H)).   Medical History: Past Medical History:  Diagnosis Date   Acute MI, anterior wall (HCC)    CAD (coronary artery disease)    2D ECHO, 07/13/2011 - EF <25%, LV moderatelty dilated, LA moderately dilatedLEXISCAN, 12/14/2011 - moderate-severe perfusion defect seen in the basal anteroseptal, mid anterior, apicacl anterior, apical, apical inferior, and apical lateral regions, post-stress EF 25%, new EKG changes from baseline abnormalities   Hypertension 08/08/2021   Inguinal hernia, left    Pre-diabetes     Assessment: 60 yoM admitted with CHF. Pt on warfarin prior to admission with hx mural thrombus.  INR elevated on admit and warfarin held, now trending down to 3.1.   PTA dosing: '5mg'$  daily except 2.'5mg'$  on Wed/Fri, INR goal of 2-3  Goal of Therapy:  INR 2-3 Monitor platelets by anticoagulation protocol: Yes   Plan:  -Warfarin 2.'5mg'$  PO x1 - reduced dose tonight -Daily INR  Arrie Senate, PharmD, BCPS, [facility] Clinical Pharmacist [phone] Please check AMION for all [place] numbers 05/08/2022

## 2022-05-08 NOTE — Progress Notes (Signed)
Heart Failure Nurse Navigator Progress Note  PCP: Ziglar, Lincoln Brigham, MD PCP-Cardiologist: Claiborne Billings Admission Diagnosis: Acute respiratory failure with hypoxia, Hypervolemia.  Admitted from: Home via EMS  Presentation:   Steven [name] presented with increased shortness of breath, bilateral lower extremity edema, coughing, congestion. Per EMS O2 sats in the 80's. BNP 1,303, Lactic Acid 3.0, Troponin 95. IV lasix given, CXR showed diffuse increasing alveolar density and lower left lung field suggestive of pulmonary edema. Patient placed on BiPAP.   Patient was educated on the sign and symptoms of heart failure, daily weights (patient reports he weighs himself everyday) Diet/ fluid restrictions ( patient stated he does eat more salt then he should) ,he takes his medications as prescribed and attends his medical appointments. A hospital follow up appointment with HF TOC is scheduled for 05/29/2022 @ 3 pm. (Patient requested the later date due to he will be having hernia surgery on 05/23/22 and can't miss anymore work up to this date.   ECHO/ LVEF: 20-25% G1DD  Clinical Course:  Past Medical History:  Diagnosis Date   Acute MI, anterior wall (HCC)    CAD (coronary artery disease)    2D ECHO, 07/13/2011 - EF <25%, LV moderatelty dilated, LA moderately dilatedLEXISCAN, 12/14/2011 - moderate-severe perfusion defect seen in the basal anteroseptal, mid anterior, apicacl anterior, apical, apical inferior, and apical lateral regions, post-stress EF 25%, new EKG changes from baseline abnormalities   Hypertension 08/08/2021   Inguinal hernia, left    Pre-diabetes      Social History   Socioeconomic History   Marital status: Single    Spouse name: Not on file   Number of children: 1   Years of education: Not on file   Highest education level: Bachelor's degree (e.g., BA, AB, BS)  Occupational History   Occupation: works on Social worker  Tobacco Use   Smoking status: Former    Types: Cigarettes, Cigars    Smokeless tobacco: Never  Vaping Use   Vaping Use: Never used  Substance and Sexual Activity   Alcohol use: Yes    Alcohol/week: 2.0 - 3.0 standard drinks of alcohol    Types: 2 - 3 drink(s) per week   Drug use: No   Sexual activity: Not on file  Other Topics Concern   Not on file  Social History Narrative   Not on file   Social Determinants of Health   Financial Resource Strain: Low Risk  (05/08/2022)   Overall Financial Resource Strain (CARDIA)    Difficulty of Paying Living Expenses: Not hard at all  Food Insecurity: No Food Insecurity (05/08/2022)   Hunger Vital Sign    Worried About Running Out of Food in the Last Year: Never true    Ran Out of Food in the Last Year: Never true  Transportation Needs: No Transportation Needs (05/08/2022)   PRAPARE - Hydrologist (Medical): No    Lack of Transportation (Non-Medical): No  Physical Activity: Not on file  Stress: Not on file  Social Connections: Not on file   Education Assessment and Provision:  Detailed education and instructions provided on heart failure disease management including the following:  Signs and symptoms of Heart Failure When to call the physician Importance of daily weights Low sodium diet Fluid restriction Medication management Anticipated future follow-up appointments  Patient education given on each of the above topics.  Patient acknowledges understanding via teach back method and acceptance of all instructions.  Education Materials:  "Living Better With  Heart Failure" Booklet, HF zone tool, & Daily Weight Tracker Tool.  Patient has scale at home: yes, weighs daily Patient has pill box at home: NA    High Risk Criteria for Readmission and/or Poor Patient Outcomes:Heart failure hospital admissions (last 6 months): 0  No Show rate: 17% Difficult social situation: No Demonstrates medication adherence: Yes Primary Language: english Literacy level: Reading, writing, and  comprehension  Barriers of Care:   Diet/ fluids (salt use) Continued HF education  Considerations/Referrals:   Referral made to Heart Failure Pharmacist Stewardship: Yes Referral made to Heart Failure CSW/NCM TOC: No Referral made to Heart & Vascular TOC clinic: Yes, 05/29/2022 @ 3 pm, (after his hernia surgery 05/23/22)  Items for Follow-up on DC/TOC: Diet/ fluids ( salt use) HF education continued   Earnestine Leys, BSN, Clinical cytogeneticist Only

## 2022-05-08 NOTE — Care Management (Signed)
  Transition of Care (TOC) Screening Note   Patient Details  Name: [place] Date of Birth: 1955-12-06   Transition of Care [facility]) CM/SW Contact:    Bethena Roys, RN Phone Number: 05/08/2022, 2:09 PM    Transition of Care Department [facility]) has reviewed the patient and no TOC needs have been identified at this time. Case Manager will continue to monitor patient advancement through interdisciplinary progression rounds. If new patient transition needs arise, please place a TOC consult.

## 2022-05-08 NOTE — Progress Notes (Signed)
PT refusing to wear bipap tonight, pt states "he doesn't feel like he needs it." RT did make pt aware if he started to feel SOB that he would need to go on bipap.

## 2022-05-08 NOTE — Progress Notes (Addendum)
Rounding Note    Patient Name: Steven [name] Date of Encounter: 05/08/2022  [place] Cardiologist: Shelva Majestic, MD   Subjective   Very congested cough, has been coughing for 5 weeks productive yellow mucus and some frothy. No chest pain, just congestion in chest. CXR ? Underlying PNA   Inpatient Medications    Scheduled Meds:  atorvastatin  80 mg Oral Daily   carvedilol  25 mg Oral BID WC   dapagliflozin propanediol  10 mg Oral Daily   isosorbide mononitrate  120 mg Oral Daily   sacubitril-valsartan  1 tablet Oral BID   sodium chloride flush  3 mL Intravenous Q12H   spironolactone  12.5 mg Oral QODAY   Warfarin - Pharmacist Dosing Inpatient   Does not apply q1600   Continuous Infusions:  sodium chloride     PRN Meds: sodium chloride, acetaminophen, guaiFENesin, ondansetron (ZOFRAN) IV, sodium chloride flush   Vital Signs    Vitals:   05/08/22 0320 05/08/22 0605 05/08/22 0811 05/08/22 0815  BP: 107/75  119/80 119/80  Pulse: 70 72 77 77  Resp: 20   18  Temp: 99.2 F (37.3 C)   99.3 F (37.4 C)  TempSrc: Oral   Oral  SpO2: 98% 99% 94% 98%  Weight:  77.6 kg      Intake/Output Summary (Last 24 hours) at 05/08/2022 0920 Last data filed at 05/08/2022 0814 Gross per 24 hour  Intake 120 ml  Output 4500 ml  Net -4380 ml      05/08/2022    6:05 AM 05/07/2022    5:03 PM 04/14/2022    2:19 PM  Last 3 Weights  Weight (lbs) 171 lb 1.6 oz 173 lb 11.6 oz 166 lb  Weight (kg) 77.61 kg 78.8 kg 75.297 kg      Telemetry    SR with PVCs 3 beats NSVT on rare occ  - Personally Reviewed  ECG    No new - Personally Reviewed  Physical Exam   GEN: No acute distress.   Neck: + JVD Cardiac: RRR, no murmurs, rubs, or gallops.  Respiratory: diminished breath sounds and coarse no wheezes to auscultation bilaterally.  Very harsh cough GI: Soft, nontender, non-distended  MS: No edema; No deformity. Neuro:  Nonfocal  Psych: Normal affect   Labs    High  Sensitivity Troponin:   Recent Labs  Lab 05/07/22 0742 05/07/22 0930  TROPONINIHS 21* 95*     Chemistry Recent Labs  Lab 05/07/22 0742 05/07/22 1801 05/08/22 0157  NA 137 142 137  K 4.5 3.9 3.6  CL 111 106 104  CO2 16* 22 23  GLUCOSE 223* 158* 117*  BUN '14 16 14  '$ CREATININE 1.47* 1.56* 1.52*  CALCIUM 8.1* 9.0 8.4*  MG 2.1 1.9  --   PROT 6.6  --   --   ALBUMIN 3.3*  --   --   AST 46*  --   --   ALT 24  --   --   ALKPHOS 62  --   --   BILITOT 0.7  --   --   GFRNONAA 52* 48* 50*  ANIONGAP '10 14 10    '$ Lipids No results for input(s): "CHOL", "TRIG", "HDL", "LABVLDL", "LDLCALC", "CHOLHDL" in the last 168 hours.  Hematology Recent Labs  Lab 05/07/22 0742  WBC 7.0  RBC 4.32  HGB 13.7  HCT 43.5  MCV 100.7*  MCH 31.7  MCHC 31.5  RDW 14.4  PLT 221  Thyroid No results for input(s): "TSH", "FREET4" in the last 168 hours.  BNP Recent Labs  Lab 05/07/22 0742  BNP 1,303.7*    DDimer No results for input(s): "DDIMER" in the last 168 hours.   Radiology    DG Chest Portable 1 View  Result Date: 05/07/2022 CLINICAL DATA:  Cough, shortness of breath, hypoxia EXAM: PORTABLE CHEST 1 VIEW COMPARISON:  Previous studies including the examination of 03/15/2021 FINDINGS: Transverse diameter of heart is increased. There is diffuse increased interstitial and alveolar densities in parahilar regions and lower lung fields, more so on the left side. There is blunting of both lateral CP angles. There is no pneumothorax. Pacemaker/defibrillator battery is seen in left infraclavicular region. Calcification is seen in the pericardium. IMPRESSION: Diffuse increase in alveolar densities in parahilar regions and lower lung fields suggest pulmonary edema. Possibility of underlying pneumonia is not excluded. Small bilateral pleural effusions, more so on the left side. Electronically Signed   By: Elmer Picker M.D.   On: 05/07/2022 08:10    Cardiac Studies   Echo 01/05/2020  1. Since the  last study on 04/18/2017 remains very low at 20-25% with  apical aneurysm and no thrombus on Definity echocontrast images.   2. Left ventricular ejection fraction, by estimation, is 20 to 25%. The  left ventricle has severely decreased function. The left ventricle  demonstrates global hypokinesis. The left ventricular internal cavity size  was moderately dilated. Left  ventricular diastolic parameters are consistent with Grade I diastolic  dysfunction (impaired relaxation).   3. Right ventricular systolic function is normal. The right ventricular  size is normal. There is normal pulmonary artery systolic pressure. The  estimated right ventricular systolic pressure is 61.4 mmHg.   4. The mitral valve is normal in structure. Mild mitral valve  regurgitation. No evidence of mitral stenosis.   5. The aortic valve is normal in structure. Aortic valve regurgitation is  not visualized. No aortic stenosis is present.   6. The inferior vena cava is normal in size with greater than 50%  respiratory variability, suggesting right atrial pressure of 3 mmHg.   Patient Profile     66 y.o. 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  Patient has a history of significant ischemic cardiomyopathy secondary to infarcted anterior wall since 2012.             -Last seen by Dr. Claiborne Billings on 04/14/2022, proBNP obtained at the time was mildly elevated to 587.  Patient complaining of increasing weight gain, shortness of breath, lower extremity edema for the past few days.  Device interrogation shows he has been accumulating fluid for the past 5 days.  BNP high at 1303.7.             - Continue Coreg, Farxiga, Imdur, Entresto, and spironolactone.  IV Lasix 40 mg twice a  day-for 2 doses -given. Discussed with Dr. Acie Fredrickson, stop amlodipine as a trial to see maybe we can increase entresto later.              -will hold off repeating echo given known infarcted anterior wall. Lactic acid was initially elevated in the setting of acute heart failure, however quickly normalized once the patient was placed on BiPAP therapy.   --  he went back on Bipap during the night.  Now off though needed HFNC at 8 L during night this AM HFNC decreased to 6 L  --per Dr. Acie Fredrickson increased home to [facility] 48-51 though with lower BP may need to stop isosorbide.   --pt is neg 4,380 since admit and wt down from 78.8 to 77.6 Kg --Cr up from 1.23 to 1.52 now  --pk hs tropnin 95 demand ischemia --? Repeat 2V CXR or CTA of chest with congestion, ? Nebs   has had cough for 5 weeks   WBC is normal  neg flu neg COVID  lactic acid was 3.0 yesterday then down to 2.4   CAD hx of large Ant MI in 2012 and EF 20-25% for years. -last cath 2012  --no chest pain  Hx of mural thrombus continue coumadin --INR 3.1 pharmacy managing   HLD continue statin  LDL 64 yesterday  Hx of subdural hematoma in 2015.  After fall.   Hx of VT no sustained episodes   Ischemic cardiomyopathy s/p Medtronic dual-chamber ICD: Last device change out in August 2020.  Device was interrogated by ED, 5 episode of nonsustained VT, no which lasted very long.  No therapy needed.  Patient has poor functional status, less than 1 hour of activity documented in the past 2 weeks.  Hyperglycemia check A1c hx of pre-diabetes    For questions or updates, please contact [place] Please consult [email or web address] for contact info under   Signed, Cecilie Kicks, NP  05/08/2022, 9:20 AM   As above, patient seen and examined.  He states his dyspnea is improving.  He also describes a productive cough for 5 weeks.  He denies chest pain.  Blood pressure has been borderline and his Delene Loll will be increased to 49/51 twice daily today.   I will therefore discontinue spironolactone.  Amlodipine has been discontinued.  Continue carvedilol, Farxiga and spironolactone.  Continue Lasix 40 mg twice daily today.  Follow renal function and likely can transition to oral Lasix tomorrow.  Add azithromycin for upper respiratory infection. Kirk Ruths, MD

## 2022-05-08 NOTE — Progress Notes (Signed)
Patient BP 96/69, due for lasix IV.  NP [facility] notified. Will hold lasix until after patient eats dinner and recheck BP.

## 2022-05-08 NOTE — Progress Notes (Signed)
   Heart Failure Stewardship Pharmacist Progress Note   PCP: Ziglar, Lincoln Brigham, MD PCP-Cardiologist: Shelva Majestic, MD    HPI:  Steven [name] is a 66 year old male with PMH of CAD with anterior MI in 2012 s/p PCI to LAD, atrial fibrillation with hx of mural thrombus on Coumadin therapy, previous subdural hematoma, CKD, CHF, Medtronic dual chamber ICD with persistently low EF, and previous VTE who presents with respiratory distress who presented with worsening shortness of breath. Weight gain, and lower extremity edema. In 2015, LVEF was as low as 15-20%. Most recent TTE 01/05/20 showed LVEF of 20-25% with grade I diastolic dysfunction. Device interrogation showed elevated fluid for 5 days prior to admission. Patient was initially requiring BiPAP. Patient was diuresed with IV Lasix 40 mg x2 yesterday.  Patient continued to have shortness of breath today. None presented when sitting up in bed, however he reports continued orthopnea. He has not walked the unit yet. He continues to require HFNC today after being on BiPAP during part of the night. His peripheral edema has improved per patient report. Minimal present on exam today.  Current HF Medications: Diuretic: no standing Lasix Beta blocker: Coreg 25 mg BID ACE/ARB/ARNI: Entresto 24-26 BID MRA: spironolactone 12.5 mg QoD SGLT2i: Farxiga 10 mg daily  Prior to admission HF Medications: Diuretic: Lasix 20 mg daily Beta blocker: Coreg 25 mg BID ACE/ARB/ARNI: Entresto 24-26 BID MRA: spironolactone 12.5 mg daily SGLT2i: Farxiga 10 mg daily  Pertinent Lab Values: Serum creatinine 1.52, BUN 14, Potassium 3.6, Sodium 137, BNP 1303.7, Magnesium 1.9 on 11/12  Vital Signs: Weight: 171.1 lbs (admission weight: 173.7 lbs) Blood pressure: 110-120/70-80s  Heart rate: 70s  I/O: -3.9L yesterday; net -4.3L this admission  Medication Assistance / Insurance Benefits Check: Does the patient have prescription insurance?  Yes Type of insurance plan:  Medicare  Outpatient Pharmacy:  Prior to admission outpatient pharmacy: [facility] Is the patient willing to use [facility] at discharge? Yes Is the patient willing to transition their outpatient pharmacy to utilize a [facility] outpatient pharmacy?   Pending    Assessment: 1. Acute on chronic systolic CHF (LVEF 93-57%), due to ICM. NYHA class II-III symptoms. - Continue strict I/Os and daily weights. Maintain Mg >2 and K >4. Mg 1.9 yesterday with no supplement, can consider of K and Mg today. -  Creatinine up slightly from baseline of ~1.2, but improved from yesterday after diuresis. Volume status difficult to asses given comorbid PNA. - Agree with increasing Entresto to 49/51 mg BID. Will monitor BP/SCr and titrate further tomorrow if BP allows. Will need to stop PTA amlodipine at discharge given Entresto titration. - Consider increasing spironolactone to daily  - Unable to ascertain if shortness of breath is due to congestion or pneumonia. Cardiology starting antibiotics for PNA.   Plan: 1) Medication changes recommended at this time: - Consider increasing spironolactone to 12.5 mg daily - Give 1-2 g of Mg pending diuresis plans - Agree with potassium 40 meq x 1 - Consider repeating Lasix 40 mg IV x 1  2) Patient assistance: - Wilder Glade and Entresto copay are $0  3)  Education  - To be completed prior to discharge  Thank you for allowing pharmacy to participate in this patient's care.  Reatha Harps, PharmD PGY2 Cardiology Pharmacy Resident 05/08/2022 8:40 AM Check [email or web address] for unit specific pharmacy number

## 2022-05-08 NOTE — Progress Notes (Signed)
Patient's BP 91/59 after dinner.  NP [facility]  notified. Will hold lasix dose tonight.

## 2022-05-09 ENCOUNTER — Inpatient Hospital Stay (HOSPITAL_COMMUNITY): Payer: 59

## 2022-05-09 DIAGNOSIS — I5021 Acute systolic (congestive) heart failure: Secondary | ICD-10-CM

## 2022-05-09 LAB — ECHOCARDIOGRAM COMPLETE
Area-P 1/2: 4.24 cm2
Height: 69 in
S' Lateral: 5.7 cm
Weight: 2692.8 oz

## 2022-05-09 LAB — BASIC METABOLIC PANEL
Anion gap: 6 (ref 5–15)
BUN: 19 mg/dL (ref 8–23)
CO2: 24 mmol/L (ref 22–32)
Calcium: 8.3 mg/dL — ABNORMAL LOW (ref 8.9–10.3)
Chloride: 109 mmol/L (ref 98–111)
Creatinine, Ser: 1.45 mg/dL — ABNORMAL HIGH (ref 0.61–1.24)
GFR, Estimated: 53 mL/min — ABNORMAL LOW (ref >60)
Glucose, Bld: 104 mg/dL — ABNORMAL HIGH (ref 70–99)
Potassium: 3.5 mmol/L (ref 3.5–5.1)
Sodium: 139 mmol/L (ref 135–145)

## 2022-05-09 LAB — PROTIME-INR
INR: 2.9 — ABNORMAL HIGH (ref 0.8–1.2)
Prothrombin Time: 30.1 seconds — ABNORMAL HIGH (ref 11.4–15.2)

## 2022-05-09 LAB — MAGNESIUM: Magnesium: 2.2 mg/dL (ref 1.7–2.4)

## 2022-05-09 MED ORDER — SACUBITRIL-VALSARTAN 24-26 MG PO TABS
1.0000 | ORAL_TABLET | Freq: Two times a day (BID) | ORAL | Status: DC
  Administered 2022-05-09 – 2022-05-10 (×3): 1 via ORAL
  Filled 2022-05-09 (×3): qty 1

## 2022-05-09 MED ORDER — POTASSIUM CHLORIDE CRYS ER 20 MEQ PO TBCR
40.0000 meq | EXTENDED_RELEASE_TABLET | Freq: Once | ORAL | Status: AC
  Administered 2022-05-09: 40 meq via ORAL
  Filled 2022-05-09: qty 2

## 2022-05-09 MED ORDER — CARVEDILOL 12.5 MG PO TABS
12.5000 mg | ORAL_TABLET | Freq: Two times a day (BID) | ORAL | Status: DC
  Filled 2022-05-09: qty 1

## 2022-05-09 MED ORDER — FUROSEMIDE 40 MG PO TABS
40.0000 mg | ORAL_TABLET | Freq: Every day | ORAL | Status: DC
  Administered 2022-05-09: 40 mg via ORAL
  Filled 2022-05-09: qty 1

## 2022-05-09 MED ORDER — PERFLUTREN LIPID MICROSPHERE
1.0000 mL | INTRAVENOUS | Status: AC | PRN
  Administered 2022-05-09: 4 mL via INTRAVENOUS

## 2022-05-09 MED ORDER — WARFARIN SODIUM 2 MG PO TABS
3.0000 mg | ORAL_TABLET | Freq: Once | ORAL | Status: AC
  Administered 2022-05-09: 3 mg via ORAL
  Filled 2022-05-09: qty 1

## 2022-05-09 NOTE — Progress Notes (Signed)
Rounding Note    Patient Name: Steven [name] Date of Encounter: 05/09/2022  [place] Cardiologist: Shelva Majestic, MD   Subjective   No CP or dyspnea; continues with cough  Inpatient Medications    Scheduled Meds:  atorvastatin  80 mg Oral Daily   azithromycin  250 mg Oral Daily   carvedilol  25 mg Oral BID WC   dapagliflozin propanediol  10 mg Oral Daily   furosemide  40 mg Intravenous BID   sacubitril-valsartan  1 tablet Oral BID   sodium chloride flush  3 mL Intravenous Q12H   spironolactone  12.5 mg Oral QODAY   warfarin  3 mg Oral ONCE-1600   Warfarin - Pharmacist Dosing Inpatient   Does not apply q1600   Continuous Infusions:  sodium chloride Stopped (05/08/22 1614)   PRN Meds: sodium chloride, acetaminophen, guaiFENesin, ondansetron (ZOFRAN) IV, sodium chloride flush   Vital Signs    Vitals:   05/08/22 1836 05/08/22 2023 05/09/22 0513 05/09/22 0738  BP: (!) 91/59 (!) 94/56 99/68 (!) 92/58  Pulse: 63 64 67 62  Resp: 20   20  Temp:  98.3 F (36.8 C) 98.2 F (36.8 C) 98 F (36.7 C)  TempSrc:  Oral Oral Oral  SpO2: 96% 94% 98% 97%  Weight:   76.3 kg   Height:        Intake/Output Summary (Last 24 hours) at 05/09/2022 0824 Last data filed at 05/09/2022 0738 Gross per 24 hour  Intake 708.03 ml  Output 1678 ml  Net -969.97 ml      05/09/2022    5:13 AM 05/08/2022    6:05 AM 05/07/2022    5:03 PM  Last 3 Weights  Weight (lbs) 168 lb 4.8 oz 171 lb 1.6 oz 173 lb 11.6 oz  Weight (kg) 76.34 kg 77.61 kg 78.8 kg      Telemetry    Atrial pacing- Personally Reviewed    Physical Exam   GEN: No acute distress.   Neck: No JVD Cardiac: RRR, no murmurs, rubs, or gallops.  Respiratory: Clear to auscultation bilaterally. GI: Soft, nontender, non-distended  MS: No edema Neuro:  Nonfocal  Psych: Normal affect   Labs    High Sensitivity Troponin:   Recent Labs  Lab 05/07/22 0742 05/07/22 0930  TROPONINIHS 21* 95*      Chemistry Recent Labs  Lab 05/07/22 0742 05/07/22 1801 05/08/22 0157 05/09/22 0153  NA 137 142 137 139  K 4.5 3.9 3.6 3.5  CL 111 106 104 109  CO2 16* '22 23 24  '$ GLUCOSE 223* 158* 117* 104*  BUN '14 16 14 19  '$ CREATININE 1.47* 1.56* 1.52* 1.45*  CALCIUM 8.1* 9.0 8.4* 8.3*  MG 2.1 1.9  --  2.2  PROT 6.6  --   --   --   ALBUMIN 3.3*  --   --   --   AST 46*  --   --   --   ALT 24  --   --   --   ALKPHOS 62  --   --   --   BILITOT 0.7  --   --   --   GFRNONAA 52* 48* 50* 53*  ANIONGAP '10 14 10 6     '$ Hematology Recent Labs  Lab 05/07/22 0742  WBC 7.0  RBC 4.32  HGB 13.7  HCT 43.5  MCV 100.7*  MCH 31.7  MCHC 31.5  RDW 14.4  PLT 221   BNP Recent Labs  Lab 05/07/22 0742  BNP 1,303.7*      Patient Profile     66 y.o. 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  Assessment & Plan    1 acute on chronic systolic congestive heart failure-patient has improved from a symptomatic standpoint.  We will change Lasix to 40 mg by mouth twice daily.  Continue Farxiga and spironolactone.  2 ischemic cardiomyopathy-we will plan repeat echocardiogram.  Blood pressure is borderline.  Decrease carvedilol to 12.5 mg twice daily and Entresto to 24/26 twice daily.  We will follow blood pressure and adjust as needed.  3 coronary artery disease-continue statin.  He is not on aspirin given need for Coumadin.  4 upper respiratory infection-continue course of azithromycin.  5 prior mural thrombus-continue Coumadin.  6 hyperlipidemia-continue statin.  7 status post ICD-follow-up electrophysiology after discharge.  We will likely discharge tomorrow if patient's blood pressure is stable and symptomatically improved.   For questions or updates, please contact [place] Please consult [email or web address]  for contact info under        Signed, Kirk Ruths, MD  05/09/2022, 8:24 AM

## 2022-05-09 NOTE — Progress Notes (Signed)
BP 92/58 tat 0738hrs.  Patient due to get lasix '40mg'$  IV and coreg '25mg'$  at 0800hrs. Dr. Stanford Breed here and updated on BP.  Will wait to give medications until after he has seen patient.

## 2022-05-09 NOTE — Progress Notes (Signed)
   Heart Failure Stewardship Pharmacist Progress Note   PCP: Ziglar, Lincoln Brigham, MD PCP-Cardiologist: Shelva Majestic, MD    HPI:  Steven [name] is a 66 year old male with PMH of CAD with anterior MI in 2012 s/p PCI to LAD, atrial fibrillation with hx of mural thrombus on Coumadin therapy, previous subdural hematoma, CKD, CHF, Medtronic dual chamber ICD with persistently low EF, and previous VTE who presents with respiratory distress who presented with worsening shortness of breath. Weight gain, and lower extremity edema. In 2015, LVEF was as low as 15-20%. Most recent TTE 01/05/20 showed LVEF of 20-25% with grade I diastolic dysfunction. Device interrogation showed elevated fluid for 5 days prior to admission. Patient was initially requiring BiPAP and diuresed with IV Lasix 40 mg xBID.  Patient reports symptoms have significantly improved. He is on room air and no longer has shortness of breath. Cardiology planning repeat echocardiogram with possible discharge tomrrow.  Current HF Medications: Diuretic: no standing Lasix Beta blocker: Coreg 25 mg BID ACE/ARB/ARNI: Entresto 24-26 BID MRA: spironolactone 12.5 mg QoD SGLT2i: Farxiga 10 mg daily  Prior to admission HF Medications: Diuretic: Lasix 20 mg daily Beta blocker: Coreg 25 mg BID ACE/ARB/ARNI: Entresto 24-26 BID MRA: spironolactone 12.5 mg daily SGLT2i: Farxiga 10 mg daily  Pertinent Lab Values: Serum creatinine 1.45, BUN 19, Potassium 3.5, Sodium 139, BNP 1303.7, Magnesium 2.2  Vital Signs: Weight: 168.3 lbs (admission weight: 173.7 lbs) Blood pressure: 90/60s  Heart rate: 60s  I/O: -0.8L yesterday; net -5.1L this admission  Medication Assistance / Insurance Benefits Check: Does the patient have prescription insurance?  Yes Type of insurance plan: Medicare  Outpatient Pharmacy:  Prior to admission outpatient pharmacy: [facility] Is the patient willing to use [facility] at discharge? Yes Is the patient willing to transition  their outpatient pharmacy to utilize a [facility] outpatient pharmacy?   Pending    Assessment: 1. Acute on chronic systolic CHF (LVEF 14-97%), due to ICM. NYHA class II-III symptoms. - Continue strict I/Os and daily weights. Maintain Mg >2 and K >4. Mg 1.9 yesterday with no supplement, can consider of K and Mg today. -  Creatinine up slightly from baseline of ~1.2, but improved from yesterday after diuresis.  - BP is down today after further diuresis and increasing Entresto. Will monitor BP/SCr and consider increasing spironolactone to daily tomorrow if stable. Will need to stop PTA amlodipine at discharge given BP   Plan: 1) Medication changes recommended at this time: - Agree with decreasing carvedilol to 12.5 mg BID and Entresto to 24-26 mg BID - Agree with potassium 40 meq x 1  2) Patient assistance: - Wilder Glade and Entresto copay are $0  3)  Education  - Patient has been educated on current HF medications and potential additions to HF medication regimen - Patient verbalizes understanding that over the next few months, these medication doses may change and more medications may be added to optimize HF regimen - Patient has been educated on basic disease state pathophysiology and goals of therapy   Thank you for allowing pharmacy to participate in this patient's care.  Reatha Harps, PharmD PGY2 Cardiology Pharmacy Resident 05/09/2022 8:15 AM Check [email or web address] for unit specific pharmacy number

## 2022-05-09 NOTE — Progress Notes (Signed)
Patient BP 86/56, HR 67, denies dizziness/CP/SOB.  PA Williams notified.  Will hold coreg due at 1700hrs and monitor BP.

## 2022-05-09 NOTE — Progress Notes (Signed)
Pt has PRN BIPAP orders. No distress noted at this time. Pt on 3L [place]. Bipap machine on standby in pt's room if needed.

## 2022-05-09 NOTE — Progress Notes (Signed)
  Echocardiogram 2D Echocardiogram has been performed.  Steven [name] 05/09/2022, 4:08 PM

## 2022-05-09 NOTE — Progress Notes (Signed)
[place] for Warfarin Indication: Mural thrombus  No Known Allergies  Patient Measurements: Height: '5\' 9"'$  (175.3 cm) Weight: 76.3 kg (168 lb 4.8 oz) IBW/kg (Calculated) : 70.7   Vital Signs: Temp: 98.2 F (36.8 C) (11/14 0513) Temp Source: Oral (11/14 0513) BP: 99/68 (11/14 0513) Pulse Rate: 67 (11/14 0513)  Labs: Recent Labs    05/07/22 0742 05/07/22 0930 05/07/22 1801 05/08/22 0157 05/09/22 0153  HGB 13.7  --   --   --   --   HCT 43.5  --   --   --   --   PLT 221  --   --   --   --   LABPROT 36.3*  --   --  32.0* 30.1*  INR 3.7*  --   --  3.1* 2.9*  CREATININE 1.47*  --  1.56* 1.52* 1.45*  TROPONINIHS 21* 95*  --   --   --      Estimated Creatinine Clearance: 49.4 mL/min (A) (by C-G formula based on SCr of 1.45 mg/dL (H)).   Medical History: Past Medical History:  Diagnosis Date   Acute MI, anterior wall (HCC)    CAD (coronary artery disease)    2D ECHO, 07/13/2011 - EF <25%, LV moderatelty dilated, LA moderately dilatedLEXISCAN, 12/14/2011 - moderate-severe perfusion defect seen in the basal anteroseptal, mid anterior, apicacl anterior, apical, apical inferior, and apical lateral regions, post-stress EF 25%, new EKG changes from baseline abnormalities   Hypertension 08/08/2021   Inguinal hernia, left    Pre-diabetes     Assessment: 23 yoM admitted with CHF. Pt on warfarin prior to admission with hx mural thrombus.  INR elevated on admit and warfarin held, now trending down and therapeutic at 2.9. Will give reduced dose tonight and likely resume home regimen tomorrow.  PTA dosing: '5mg'$  daily except 2.'5mg'$  on Wed/Fri, INR goal of 2-3  Goal of Therapy:  INR 2-3 Monitor platelets by anticoagulation protocol: Yes   Plan:  -Warfarin '3mg'$  PO x1 - reduced dose tonight -Daily INR  Arrie Senate, PharmD, BCPS, [facility] Clinical Pharmacist [phone] Please check AMION for all [place] numbers 05/09/2022

## 2022-05-10 ENCOUNTER — Other Ambulatory Visit: Payer: Self-pay | Admitting: Physician Assistant

## 2022-05-10 LAB — BASIC METABOLIC PANEL
Anion gap: 8 (ref 5–15)
BUN: 22 mg/dL (ref 8–23)
CO2: 22 mmol/L (ref 22–32)
Calcium: 8.5 mg/dL — ABNORMAL LOW (ref 8.9–10.3)
Chloride: 106 mmol/L (ref 98–111)
Creatinine, Ser: 1.46 mg/dL — ABNORMAL HIGH (ref 0.61–1.24)
GFR, Estimated: 52 mL/min — ABNORMAL LOW (ref >60)
Glucose, Bld: 110 mg/dL — ABNORMAL HIGH (ref 70–99)
Potassium: 3.9 mmol/L (ref 3.5–5.1)
Sodium: 136 mmol/L (ref 135–145)

## 2022-05-10 LAB — PROTIME-INR
INR: 2.1 — ABNORMAL HIGH (ref 0.8–1.2)
Prothrombin Time: 23.3 seconds — ABNORMAL HIGH (ref 11.4–15.2)

## 2022-05-10 MED ORDER — POTASSIUM CHLORIDE CRYS ER 20 MEQ PO TBCR
40.0000 meq | EXTENDED_RELEASE_TABLET | Freq: Once | ORAL | Status: AC
  Administered 2022-05-10: 40 meq via ORAL
  Filled 2022-05-10: qty 2

## 2022-05-10 MED ORDER — FUROSEMIDE 20 MG PO TABS: 60.0000 mg | ORAL_TABLET | Freq: Every day | ORAL | 3 refills | Status: DC

## 2022-05-10 MED ORDER — ATORVASTATIN CALCIUM 80 MG PO TABS: 80.0000 mg | ORAL_TABLET | Freq: Every day | ORAL | 3 refills | Status: DC

## 2022-05-10 MED ORDER — AZITHROMYCIN 250 MG PO TABS: 250.0000 mg | ORAL_TABLET | Freq: Every day | ORAL | 0 refills | Status: AC

## 2022-05-10 MED ORDER — CARVEDILOL 6.25 MG PO TABS
6.2500 mg | ORAL_TABLET | Freq: Two times a day (BID) | ORAL | Status: DC
  Administered 2022-05-10: 6.25 mg via ORAL
  Filled 2022-05-10: qty 1

## 2022-05-10 MED ORDER — CARVEDILOL 6.25 MG PO TABS: 6.2500 mg | ORAL_TABLET | Freq: Two times a day (BID) | ORAL | 3 refills | Status: DC

## 2022-05-10 MED ORDER — FUROSEMIDE 40 MG PO TABS
60.0000 mg | ORAL_TABLET | Freq: Every day | ORAL | Status: DC
  Administered 2022-05-10: 60 mg via ORAL
  Filled 2022-05-10: qty 1

## 2022-05-10 MED ORDER — WARFARIN SODIUM 5 MG PO TABS: 5.0000 mg | ORAL_TABLET | Freq: Once | ORAL | Status: DC

## 2022-05-10 NOTE — Discharge Instructions (Addendum)
Heart failure prevention instruction: Avoid salt, low salt or no salt in food Limit daily fluid intake to between 32 and 64 oz  Weigh yourself every morning, call cardiology if your weight increase by more than 3 lbs overnight or 5 lbs in a single week. Your discharge dry weight is 167.4 lbs. Bring your weight diary to every office visit Bring your medication bottles to every office visit.

## 2022-05-10 NOTE — Progress Notes (Signed)
Rounding Note    Patient Name: Steven [name] Date of Encounter: 05/10/2022  [facility] Cardiologist: Shelva Majestic, MD   Subjective   Pt denies CP or dyspnea; cough improving  Inpatient Medications    Scheduled Meds:  atorvastatin  80 mg Oral Daily   azithromycin  250 mg Oral Daily   dapagliflozin propanediol  10 mg Oral Daily   furosemide  40 mg Oral Daily   sacubitril-valsartan  1 tablet Oral BID   sodium chloride flush  3 mL Intravenous Q12H   spironolactone  12.5 mg Oral QODAY   Warfarin - Pharmacist Dosing Inpatient   Does not apply q1600   Continuous Infusions:  sodium chloride Stopped (05/08/22 1614)   PRN Meds: sodium chloride, acetaminophen, guaiFENesin, ondansetron (ZOFRAN) IV, sodium chloride flush   Vital Signs    Vitals:   05/09/22 1814 05/09/22 2021 05/10/22 0029 05/10/22 0502  BP: (!) 86/69 100/63 106/72 104/75  Pulse: 62 63 60 60  Resp: '20 18 18 16  '$ Temp:  98.6 F (37 C) 98.8 F (37.1 C) 98.3 F (36.8 C)  TempSrc:  Oral Oral Oral  SpO2: 94% 95% 94% 100%  Weight:    75.9 kg  Height:        Intake/Output Summary (Last 24 hours) at 05/10/2022 0713 Last data filed at 05/10/2022 0500 Gross per 24 hour  Intake 480 ml  Output 1353 ml  Net -873 ml       05/10/2022    5:02 AM 05/09/2022    5:13 AM 05/08/2022    6:05 AM  Last 3 Weights  Weight (lbs) 167 lb 6.4 oz 168 lb 4.8 oz 171 lb 1.6 oz  Weight (kg) 75.932 kg 76.34 kg 77.61 kg      Telemetry    Atrial pacing with PVCs- Personally Reviewed    Physical Exam   GEN: WD WN NAD Neck: supple Cardiac: RRR Respiratory: CTA GI: Soft, NT/ND MS: No edema Neuro:  Grossly intact Psych: Normal affect   Labs    High Sensitivity Troponin:   Recent Labs  Lab 05/07/22 0742 05/07/22 0930  TROPONINIHS 21* 95*      Chemistry Recent Labs  Lab 05/07/22 0742 05/07/22 1801 05/08/22 0157 05/09/22 0153 05/10/22 0235  NA 137 142 137 139 136  K 4.5 3.9 3.6 3.5 3.9  CL 111  106 104 109 106  CO2 16* '22 23 24 22  '$ GLUCOSE 223* 158* 117* 104* 110*  BUN '14 16 14 19 22  '$ CREATININE 1.47* 1.56* 1.52* 1.45* 1.46*  CALCIUM 8.1* 9.0 8.4* 8.3* 8.5*  MG 2.1 1.9  --  2.2  --   PROT 6.6  --   --   --   --   ALBUMIN 3.3*  --   --   --   --   AST 46*  --   --   --   --   ALT 24  --   --   --   --   ALKPHOS 62  --   --   --   --   BILITOT 0.7  --   --   --   --   GFRNONAA 52* 48* 50* 53* 52*  ANIONGAP '10 14 10 6 8      '$ Hematology Recent Labs  Lab 05/07/22 0742  WBC 7.0  RBC 4.32  HGB 13.7  HCT 43.5  MCV 100.7*  MCH 31.7  MCHC 31.5  RDW 14.4  PLT 221  BNP Recent Labs  Lab 05/07/22 0742  BNP 1,303.7*       Patient Profile     66 y.o. 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  Assessment & Plan    1 acute on chronic systolic congestive heart failure-patient has improved.  We will plan to treat with Lasix 60 mg by mouth daily.  Continue present dose of spironolactone and Farxiga.  Patient will need to bmet in 1 week following discharge.    2 ischemic cardiomyopathy-echocardiogram shows severely reduced LV function.  This is similar to previous.  Blood pressure remains borderline but mildly improved.  We will continue Entresto at present dose.  Resume carvedilol 6.25 mg twice daily.  Follow blood pressure as an outpatient and adjust medications as needed.    3 coronary artery disease-continue statin.  He is not on aspirin given need for Coumadin.  4 upper respiratory infection-complete course of azithromycin.  5 prior mural thrombus-thrombus again noted on echocardiogram.  Continue Coumadin with goal INR 2-3.  Follow-up in Coumadin clinic 1 week after discharge.  6 hyperlipidemia-continue statin.  7 status post ICD-follow-up electrophysiology after discharge.  Plan  discharge today on present medications.  Check potassium and renal function in 1 week.  Arrange follow-up with APP in 1 week.  Follow-up Dr. Claiborne Billings 6 weeks.  Greater than 30 minutes PA and physician time.  D2  For questions or updates, please contact [place] Please consult [email or web address] for contact info under        Signed, Kirk Ruths, MD  05/10/2022, 7:13 AM

## 2022-05-10 NOTE — Progress Notes (Signed)
Mobility Specialist - Progress Note   05/10/22 1157  Mobility  Activity Ambulated with assistance in room  Level of Assistance Standby assist, set-up cues, supervision of patient - no hands on  Assistive Device None  Distance Ambulated (ft) 20 ft  Activity Response Tolerated well  Mobility Referral No  $Mobility charge 1 Mobility   Pt received in bed declining hallway mobility but agreeable to ambulate in room. No complaints throughout. Pt left EOB with all needs met.  Franki Monte  Mobility Specialist Please contact via [facility] or Rehab office at [phone]

## 2022-05-10 NOTE — Progress Notes (Signed)
   Heart Failure Stewardship Pharmacist Progress Note   PCP: Ziglar, Lincoln Brigham, MD PCP-Cardiologist: Shelva Majestic, MD    HPI:  Steven [name] is a 66 year old male with PMH of CAD with anterior MI in 2012 s/p PCI to LAD, atrial fibrillation with hx of mural thrombus on Coumadin therapy, previous subdural hematoma, CKD, CHF, Medtronic dual chamber ICD with persistently low EF, and previous VTE who presents with respiratory distress who presented with worsening shortness of breath. Weight gain, and lower extremity edema. In 2015, LVEF was as low as 15-20%. Most recent TTE 01/05/20 showed LVEF of 20-25% with grade I diastolic dysfunction. Device interrogation showed elevated fluid for 5 days prior to admission. Patient was initially requiring BiPAP but now on room air. Echocardiogram 11/14 showed LVEF <20% with moderately reduced RV function. Cardiology noted plans for discharge today.  Current HF Medications: Diuretic: Lasix 60 mg PO QD Beta blocker: Coreg 6.25 mg BID ACE/ARB/ARNI: Entresto 24-26 BID MRA: spironolactone 12.5 mg QoD SGLT2i: Farxiga 10 mg daily  Prior to admission HF Medications: Diuretic: Lasix 20 mg daily Beta blocker: Coreg 25 mg BID ACE/ARB/ARNI: Entresto 24-26 BID MRA: spironolactone 12.5 mg daily SGLT2i: Farxiga 10 mg daily  Pertinent Lab Values: Serum creatinine 1.46, BUN 19, Potassium 3.9, Sodium 136, BNP 1303.7, Magnesium 2.2  Vital Signs: Weight: 167.4 lbs (admission weight: 173.7 lbs) Blood pressure: 100/70s  Heart rate: 60s  I/O: -0.67L yesterday; net -5.6L this admission  Medication Assistance / Insurance Benefits Check: Does the patient have prescription insurance?  Yes Type of insurance plan: Medicare  Outpatient Pharmacy:  Prior to [facility]: [facility] Is the patient willing to use [facility] at discharge? Yes Is the patient willing to transition their outpatient pharmacy to utilize a [facility] outpatient pharmacy?   Pending     Assessment: 1. Acute on chronic systolic CHF (LVEF 82-50%), due to ICM. NYHA class II-III symptoms. - Continue strict I/Os and daily weights. Maintain Mg >2 and K >4. Mg 1.9 yesterday with no supplement, can consider of K and Mg today. -  Creatinine up slightly from baseline of ~1.2, but improved from yesterday after diuresis.  - BP is stable this AM. Can consider changing frequency of spironolactone to daily. Will need to stop PTA amlodipine at discharge given BP   Plan: 1) Medication changes recommended at this time: - Change spironolactone to 12.5 mg daily  2) Patient assistance: - Wilder Glade and Entresto copay are $0  3)  Education  - Patient has been educated on current HF medications and potential additions to HF medication regimen - Patient verbalizes understanding that over the next few months, these medication doses may change and more medications may be added to optimize HF regimen - Patient has been educated on basic disease state pathophysiology and goals of therapy   Thank you for allowing pharmacy to participate in this patient's care.  Reatha Harps, PharmD PGY2 Cardiology Pharmacy Resident 05/10/2022 7:41 AM Check [email or web address] for unit specific pharmacy number

## 2022-05-10 NOTE — Discharge Summary (Cosign Needed Addendum)
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 On contrast images, there is an adherent mass at the inferoseptal  apical portion that is consistent with laminated/chronic thrombus. No  mobile aspects seen and not appreciated in other views. Left ventricular  ejection fraction, by estimation, is  <20%. The left ventricle has severely decreased function. The left  ventricle demonstrates global hypokinesis. The left ventricular internal  cavity size was moderately dilated. Left ventricular diastolic parameters  are indeterminate.   2. Right ventricular systolic function is moderately reduced. The right  ventricular size is normal. There is mildly elevated pulmonary artery  systolic pressure.   3. Left atrial size was mild to moderately dilated.   4. Right atrial size was mildly dilated.   5. The mitral valve is normal in structure. Trivial mitral valve  regurgitation. No evidence of mitral stenosis.   6. The aortic valve is grossly normal. Aortic valve regurgitation is not  visualized. No aortic stenosis is present.   7. The inferior vena cava is dilated in size with <50% respiratory   variability, suggesting right atrial pressure of 15 mmHg.   Comparison(s): Changes from prior study are noted. Prior EF 20-25%, now  <20%.  _____________   History of Present Illness     Steven [name] is a 66 y.o. 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   Steven [name] is a 66 year old male from [place] with past medical history of CAD, ICM s/p Medtronic dual chamber ICD, VT, history of mural thrombus on Coumadin, subdural hematoma secondary to fall/head trauma, CKD stage III and hyperlipidemia.  Patient had a large out of the hospital anterior MI in January 2012, he presented after a 3-hour delay to the hospital.  He eventually underwent successful PCI of LAD.  Afterward, due to persistently low EF, he underwent ICD implantation.  Interrogation of his device in the past has shown nonsustained VT, carvedilol was further uptitrated.  Echocardiogram in February 2015 showed EF persistently low at 20 to 25%, akinesis of the entire anteroseptal myocardium and apical region, medium sized minimal thrombus.  His Plavix was discontinued and that he was started on Coumadin therapy.  In August 2015, he tripped and hit his head on a bar that resulted in subdural hematoma.  Aspirin and Coumadin were initially held, they were later restarted once cleared by neurosurgery.  EF was 15 to 20% on echocardiogram with no evidence of thrombus at the time.  After his previous device reached ERI, he underwent dual-chamber  ICD device change out on 02/24/2019 by Dr. Sallyanne Kuster.  Due to chronic anginal symptom, he was placed on both Imdur and amlodipine.  Repeat echocardiogram in July 2021 continues to show a low EF of 20 to 25%.   Patient was most recently seen by Dr. Claiborne Billings on 04/14/2022 at which time he was doing well.  His blood pressure was low  on that day at 88/60, spironolactone was reduced to 12.5 mg daily.  proBNP obtained in the office was elevated to 587.  In the past few days, he mentions he has been having progressive dyspnea with exertion and a cough.  He has also noticed weight gain and lower extremity edema.  He eventually sought medical attention at [facility] ED.  COVID and influenza test negative.  Initial lactic acid was 3.0.  Other significant lab work include creatinine of 1.47, albumin 3.3, glucose 223.  BNP has increased to 1303.7.  Chest x-ray showed diffuse increasing alveolar density in the perihilar region and lower left lung field suggestive of pulmonary edema, however cannot exclude pneumonia.  Patient was placed on BiPAP as O2 saturation obtained by fire department was in the 80s.  Repeat lactic acid 2 hours later shows lactic acid has returned back down to normal of 1.0.  Serial troponin was 21-->95.  He has been given 40 mg IV Lasix.  Device interrogation shows 5 episode of nonsustained VT, no device therapy required, possible OptiVol fluid accumulation since November 7.  Patient's activity has been less than 1 hour/day for the past 2 weeks.     Hospital Course     Consultants: N/A   The patient was admitted to cardiology service for heart failure.  He admitted to dietary indiscretion.  On the night of admission, he required another course of BiPAP therapy overnight due to worsening dyspnea. His amlodipine and imdur were taken off in hope to increase Entresto dosage. His home dose of Entresto was increased to 49-51 mg twice a day, however had to reduce back to the previous home dose due to BP. Lipitor was increased to '80mg'$  daily. He was given a course of azithromycin for upper respiratory infection. Echo obtained 05/09/2022 showed chronic LV thrombus, EF less than 20%, moderately reduced RVEF, trivial MR.  Recent hospitalization, patient put out a total of -5.6 L of fluid.  His weight has decreased from 173 pound on  admission down to 167.4 pounds (dry weight) on the day of discharge.  He was seen in the morning on 05/10/2022 at which time he was doing well.  Oral Lasix was reduced to 60 mg daily.  It was recommended for the patient to obtain basic metabolic panel in 1 week after discharge, he will also need 1 week follow up with APP and follow up with Dr. Claiborne Billings in 6 weeks. Since he had 3 days of Azithromycin in the hospital, I have prescribed 2 more days of Azithromycin to complete a 5 day course.       Did the patient have an acute coronary syndrome (MI, NSTEMI, STEMI, etc) this admission?:  No                               Did the patient have a percutaneous coronary intervention (stent / angioplasty)?:  No.        The patient will be scheduled for a TOC follow up appointment in 7 days.  A message has been sent to the  TOC Pool and Scheduling Pool at the office where the patient should be seen for follow up.  _____________  Discharge Vitals Blood pressure 99/63, pulse 69, temperature 98 F (36.7 C), temperature source Oral, resp. rate 16, height '5\' 9"'$  (1.753 m), weight 75.9 kg, SpO2 92 %.  Filed Weights   05/08/22 0605 05/09/22 0513 05/10/22 0502  Weight: 77.6 kg 76.3 kg 75.9 kg    Labs & Radiologic Studies    CBC No results for input(s): "WBC", "NEUTROABS", "HGB", "HCT", "MCV", "PLT" in the last 72 hours. Basic Metabolic Panel Recent Labs    05/07/22 1801 05/08/22 0157 05/09/22 0153 05/10/22 0235  NA 142   < > 139 136  K 3.9   < > 3.5 3.9  CL 106   < > 109 106  CO2 22   < > 24 22  GLUCOSE 158*   < > 104* 110*  BUN 16   < > 19 22  CREATININE 1.56*   < > 1.45* 1.46*  CALCIUM 9.0   < > 8.3* 8.5*  MG 1.9  --  2.2  --    < > = values in this interval not displayed.   Liver Function Tests No results for input(s): "AST", "ALT", "ALKPHOS", "BILITOT", "PROT", "ALBUMIN" in the last 72 hours. No results for input(s): "LIPASE", "AMYLASE" in the last 72 hours. High Sensitivity Troponin:    Recent Labs  Lab 05/07/22 0742 05/07/22 0930  TROPONINIHS 21* 95*    BNP Invalid input(s): "POCBNP" D-Dimer No results for input(s): "DDIMER" in the last 72 hours. Hemoglobin A1C Recent Labs    05/08/22 0157  HGBA1C 6.4*   Fasting Lipid Panel No results for input(s): "CHOL", "HDL", "LDLCALC", "TRIG", "CHOLHDL", "LDLDIRECT" in the last 72 hours. Thyroid Function Tests No results for input(s): "TSH", "T4TOTAL", "T3FREE", "THYROIDAB" in the last 72 hours.  Invalid input(s): "FREET3" _____________  ECHOCARDIOGRAM COMPLETE  Result Date: 05/09/2022    ECHOCARDIOGRAM REPORT   Patient Name:   Steven [name] [facility] Date of Exam: 05/09/2022 Medical Rec #:  [id number]    Height:       69.0 in Accession #:    [id number]   Weight:       168.3 lb Date of Birth:  Jan 10, 1956    BSA:          1.920 m Patient Age:    44 years     BP:           100/72 mmHg Patient Gender: M            HR:           64 bpm. Exam Location:  Inpatient Procedure: 2D Echo, Cardiac Doppler, Color Doppler and Intracardiac            Opacification Agent Indications:    CHF-Acute Systolic Q65.78  History:        Patient has prior history of Echocardiogram examinations, most                 recent 01/05/2020. CHF and Cardiomyopathy, CAD and Previous                 Myocardial Infarction, Arrythmias:Bradycardia,                 Signs/Symptoms:Shortness of Breath; Risk Factors:Dyslipidemia,                 Hypertension and Former Smoker.  Sonographer:    Greer Pickerel Referring Phys: [place]  Sonographer Comments: Image acquisition challenging due to patient body habitus and Image acquisition challenging due to respiratory motion. IMPRESSIONS  1. On contrast images, there is an adherent mass at the inferoseptal apical portion that is consistent with laminated/chronic thrombus. No mobile aspects seen and not appreciated in other views. Left ventricular ejection fraction, by estimation, is <20%. The left ventricle has severely  decreased function. The left ventricle demonstrates global hypokinesis. The left ventricular internal cavity size was moderately dilated. Left ventricular diastolic parameters are indeterminate.  2. Right ventricular systolic function is moderately reduced. The right ventricular size is normal. There is mildly elevated pulmonary artery systolic pressure.  3. Left atrial size was mild to moderately dilated.  4. Right atrial size was mildly dilated.  5. The mitral valve is normal in structure. Trivial mitral valve regurgitation. No evidence of mitral stenosis.  6. The aortic valve is grossly normal. Aortic valve regurgitation is not visualized. No aortic stenosis is present.  7. The inferior vena cava is dilated in size with <50% respiratory variability, suggesting right atrial pressure of 15 mmHg. Comparison(s): Changes from prior study are noted. Prior EF 20-25%, now <20%. Conclusion(s)/Recommendation(s): Adherent LV thrombus seen at inferoseptal apical segment. While patient has known prior LV thrombus, I did not see these on echo contrast images from 2021 study. Findings communicated to Dr. Stanford Breed. FINDINGS  Left Ventricle: On contrast images, there is an adherent mass at the inferoseptal apical portion that is consistent with laminated/chronic thrombus. No mobile aspects seen and not appreciated in other views. Left ventricular ejection fraction, by estimation, is <20%. The left ventricle has severely decreased function. The left ventricle demonstrates global hypokinesis. Definity contrast agent was given IV to delineate the left ventricular endocardial borders. The left ventricular internal cavity size was moderately dilated. There is no left ventricular hypertrophy. Left ventricular diastolic parameters are indeterminate. Right Ventricle: The right ventricular size is normal. Right vetricular wall thickness was not well visualized. Right ventricular systolic function is moderately reduced. There is mildly  elevated pulmonary artery systolic pressure. The tricuspid regurgitant velocity is 2.59 m/s, and with an assumed right atrial pressure of 15 mmHg, the estimated right ventricular systolic pressure is 09.8 mmHg. Left Atrium: Left atrial size was mild to moderately dilated. Right Atrium: Right atrial size was mildly dilated. Pericardium: There is no evidence of pericardial effusion. Mitral Valve: The mitral valve is normal in structure. Trivial mitral valve regurgitation. No evidence of mitral valve stenosis. Tricuspid Valve: The tricuspid valve is grossly normal. Tricuspid valve regurgitation is trivial. No evidence of tricuspid stenosis. Aortic Valve: The aortic valve is grossly normal. Aortic valve regurgitation is not visualized. No aortic stenosis is present. Pulmonic Valve: The pulmonic valve was not well visualized. Pulmonic valve regurgitation is not visualized. No evidence of pulmonic stenosis. Aorta: The aortic root and ascending aorta are structurally normal, with no evidence of dilitation. Venous: The inferior vena cava is dilated in size with less than 50% respiratory variability, suggesting right atrial pressure of 15 mmHg. IAS/Shunts: The interatrial septum was not well visualized. Additional Comments: A device lead is visualized.  LEFT VENTRICLE PLAX 2D LVIDd:         5.90 cm   Diastology LVIDs:         5.70 cm   LV e' medial:    5.43 cm/s LV PW:         1.00 cm   LV E/e' medial:  13.4 LV IVS:        0.30  cm   LV e' lateral:   12.20 cm/s LVOT diam:     1.90 cm   LV E/e' lateral: 5.9 LV SV:         54 LV SV Index:   28 LVOT Area:     2.84 cm  RIGHT VENTRICLE RV S prime:     6.81 cm/s TAPSE (M-mode): 0.9 cm LEFT ATRIUM             Index        RIGHT ATRIUM           Index LA diam:        3.40 cm 1.77 cm/m   RA Area:     17.80 cm LA Vol (A2C):   70.8 ml 36.88 ml/m  RA Volume:   43.30 ml  22.55 ml/m LA Vol (A4C):   74.2 ml 38.65 ml/m LA Biplane Vol: 76.6 ml 39.90 ml/m  AORTIC VALVE LVOT Vmax:    94.60 cm/s LVOT Vmean:  66.200 cm/s LVOT VTI:    0.191 m  AORTA Ao Root diam: 3.30 cm Ao Asc diam:  3.50 cm MITRAL VALVE               TRICUSPID VALVE MV Area (PHT): 4.24 cm    TR Peak grad:   26.8 mmHg MV Decel Time: 179 msec    TR Vmax:        259.00 cm/s MV E velocity: 72.50 cm/s MV A velocity: 47.10 cm/s  SHUNTS MV E/A ratio:  1.54        Systemic VTI:  0.19 m                            Systemic Diam: 1.90 cm Buford Dresser MD Electronically signed by Buford Dresser MD Signature Date/Time: 05/09/2022/6:12:15 PM    Final    DG Chest 2 View  Result Date: 05/09/2022 CLINICAL DATA:  Shortness of breath EXAM: CHEST - 2 VIEW COMPARISON:  05/07/2022 FINDINGS: Pacemaker/AICD appears unchanged. Heart size upper limits of normal. Pericardial calcification as seen previously. Marked reduction in widespread pulmonary edema. Minimal interstitial edema persists. No consolidation, collapse or measurable effusion. Minimal blunting of the posterior costophrenic angles. IMPRESSION: Marked reduction in widespread pulmonary edema. Minimal interstitial edema persists. Tiny amount of pleural fluid. Electronically Signed   By: Nelson Chimes M.D.   On: 05/09/2022 08:55   DG Chest Portable 1 View  Result Date: 05/07/2022 CLINICAL DATA:  Cough, shortness of breath, hypoxia EXAM: PORTABLE CHEST 1 VIEW COMPARISON:  Previous studies including the examination of 03/15/2021 FINDINGS: Transverse diameter of heart is increased. There is diffuse increased interstitial and alveolar densities in parahilar regions and lower lung fields, more so on the left side. There is blunting of both lateral CP angles. There is no pneumothorax. Pacemaker/defibrillator battery is seen in left infraclavicular region. Calcification is seen in the pericardium. IMPRESSION: Diffuse increase in alveolar densities in parahilar regions and lower lung fields suggest pulmonary edema. Possibility of underlying pneumonia is not excluded. Small  bilateral pleural effusions, more so on the left side. Electronically Signed   By: Elmer Picker M.D.   On: 05/07/2022 08:10   Disposition   Pt is being discharged home today in good condition.  Follow-up Plans & Appointments     Follow-up Information     [facility]. Go on 05/22/2022.   Specialty: Cardiology Why: Hospital follow up on 11/27  at 12:00 noon. Obtain BMET blood work. PLEASE bring a current medication list to appointment FREE valet parking, Entrance C, off [employer] information: [address] [id number] [place]        Troy Sine, MD Follow up on 05/30/2022.   Specialty: Cardiology Why: 9:00AM. Cardiology follow up Contact information: [address] [place] [address] [phone]         [facility] [address] [facility] [place] Follow up on 05/12/2022.   Specialty: Cardiology Why: 1:30PM. Coumadin clinic visit Contact information: [place] [id number] [place] Follow up.   Specialty: Cardiology Why: Obtain BMET in 5-7 days at [facility] office. No fasting needed. Contact information: [place] [address] [id number] [place] [phone]                  Discharge Medications   Allergies as of 05/10/2022   No Known Allergies      Medication List     STOP taking these medications    amLODipine 2.5 MG tablet Commonly known as: NORVASC   isosorbide mononitrate 120 MG 24 hr tablet Commonly known as: IMDUR       TAKE these medications    acetaminophen 500 MG tablet Commonly known as: TYLENOL Take 1,000 mg by mouth every 6 (six) hours as needed for mild pain.   atorvastatin 80 MG tablet Commonly known as:  LIPITOR Take 1 tablet (80 mg total) by mouth daily. Start taking on: May 11, 2022 What changed:  medication strength how much to take   azithromycin 250 MG tablet Commonly known as: ZITHROMAX Take 1 tablet (250 mg total) by mouth daily for 2 doses. Start taking on: May 11, 2022   carvedilol 6.25 MG tablet Commonly known as: COREG Take 1 tablet (6.25 mg total) by mouth 2 (two) times daily with a meal. What changed:  medication strength how much to take   dapagliflozin propanediol 10 MG Tabs tablet Commonly known as: FARXIGA Take 10 mg by mouth daily.   Entresto 24-26 MG Generic drug: sacubitril-valsartan Take 1 tablet by mouth 2 (two) times daily.   furosemide 20 MG tablet Commonly known as: LASIX Take 3 tablets (60 mg total) by mouth daily. What changed: See the new instructions.   multivitamin tablet Take 1 tablet by mouth daily.   nitroGLYCERIN 0.4 MG SL tablet Commonly known as: NITROSTAT Place 1 tablet (0.4 mg total) under the tongue every 5 (five) minutes as needed. Usual dose is q 5 minutes x 3 doses.   spironolactone 25 MG tablet Commonly known as: ALDACTONE Take 0.5 tablets (12.5 mg total) by mouth every other day. TAKE EOD ON EVEN DAYS   tamsulosin 0.4 MG Caps capsule Commonly known as: FLOMAX Take 0.4 mg by mouth at bedtime.   Vitamin D 125 MCG (5000 UT) Caps Take 5,000 Units by mouth daily.   warfarin 5 MG tablet Commonly known as: COUMADIN Take as directed. If you are unsure how to take this medication, talk to your nurse or doctor. Original instructions: 1/2 TO 1 (ONE-HALF TO ONE) ONCE DAILY AS DIRECTED BY THE COUMADIN CLINIC What changed:  how much to take how to take this when to take this additional instructions   ZENPEP PO Take 1-4 capsules by mouth with breakfast, with lunch, and  with evening meal.           Outstanding Labs/Studies   BMET in 5-7 days  Duration of Discharge Encounter   Greater than 30 minutes  including physician time.  Hilbert Corrigan, PA 05/10/2022, 11:15 AM

## 2022-05-10 NOTE — Progress Notes (Signed)
[facility] for Warfarin Indication: Mural thrombus  No Known Allergies  Patient Measurements: Height: '5\' 9"'$  (175.3 cm) Weight: 75.9 kg (167 lb 6.4 oz) IBW/kg (Calculated) : 70.7   Vital Signs: Temp: 98.3 F (36.8 C) (11/15 0502) Temp Source: Oral (11/15 0502) BP: 104/75 (11/15 0502) Pulse Rate: 60 (11/15 0502)  Labs: Recent Labs    05/07/22 0742 05/07/22 0930 05/07/22 1801 05/08/22 0157 05/09/22 0153 05/10/22 0235  HGB 13.7  --   --   --   --   --   HCT 43.5  --   --   --   --   --   PLT 221  --   --   --   --   --   LABPROT 36.3*  --   --  32.0* 30.1* 23.3*  INR 3.7*  --   --  3.1* 2.9* 2.1*  CREATININE 1.47*  --    < > 1.52* 1.45* 1.46*  TROPONINIHS 21* 95*  --   --   --   --    < > = values in this interval not displayed.     Estimated Creatinine Clearance: 49.1 mL/min (A) (by C-G formula based on SCr of 1.46 mg/dL (H)).   Medical History: Past Medical History:  Diagnosis Date   Acute MI, anterior wall (HCC)    CAD (coronary artery disease)    2D ECHO, 07/13/2011 - EF <25%, LV moderatelty dilated, LA moderately dilatedLEXISCAN, 12/14/2011 - moderate-severe perfusion defect seen in the basal anteroseptal, mid anterior, apicacl anterior, apical, apical inferior, and apical lateral regions, post-stress EF 25%, new EKG changes from baseline abnormalities   Hypertension 08/08/2021   Inguinal hernia, left    Pre-diabetes     Assessment: 3 yoM admitted with CHF. Pt on warfarin prior to admission with hx mural thrombus.  INR elevated on admit and warfarin held x1, now trending down and therapeutic at 2.1.   PTA dosing: '5mg'$  daily except 2.'5mg'$  on Wed/Fri, INR goal of 2-3  Goal of Therapy:  INR 2-3 Monitor platelets by anticoagulation protocol: Yes   Plan:  -Warfarin '5mg'$  PO x1 -Daily INR   Arrie Senate, PharmD, BCPS, [facility] Clinical Pharmacist [phone] Please check AMION for all [place]  numbers 05/10/2022

## 2022-05-11 ENCOUNTER — Other Ambulatory Visit: Payer: 59

## 2022-05-12 ENCOUNTER — Other Ambulatory Visit: Payer: 59

## 2022-05-12 ENCOUNTER — Ambulatory Visit
Admission: RE | Admit: 2022-05-12 | Discharge: 2022-05-12 | Disposition: A | Discharge status: Home / Self Care | Payer: 59 | Source: Ambulatory Visit | Attending: Otolaryngology | Admitting: Otolaryngology

## 2022-05-12 ENCOUNTER — Ambulatory Visit: Payer: 59 | Attending: Cardiology

## 2022-05-12 ENCOUNTER — Telehealth: Payer: Self-pay

## 2022-05-12 ENCOUNTER — Encounter: Payer: Self-pay | Admitting: Cardiovascular Disease

## 2022-05-12 DIAGNOSIS — E041 Nontoxic single thyroid nodule: Secondary | ICD-10-CM

## 2022-05-12 DIAGNOSIS — I513 Intracardiac thrombosis, not elsewhere classified: Secondary | ICD-10-CM | POA: Diagnosis not present

## 2022-05-12 DIAGNOSIS — Z7901 Long term (current) use of anticoagulants: Secondary | ICD-10-CM | POA: Diagnosis not present

## 2022-05-12 LAB — POCT INR: INR: 2.3 (ref 2.0–3.0)

## 2022-05-12 MED ORDER — ENTRESTO 24-26 MG PO TABS: 1.0000 | ORAL_TABLET | Freq: Two times a day (BID) | ORAL | 3 refills | Status: DC

## 2022-05-12 NOTE — Progress Notes (Signed)
PERIOPERATIVE PRESCRIPTION FOR IMPLANTED CARDIAC DEVICE PROGRAMMING  Patient Information: Name:  Steven [name]  DOB:  [birth year]  MRN:  [id number]    Planned Procedure:  Repair Right Inguinal Hernia With Mesh  Surgeon:  Dr. Erroll Luna  Date of Procedure:  05/23/2022  Cautery will be used.  Position during surgery:  Supine   Please send documentation back to:  [facility] (Fax # [phone])  Device Information:  Clinic EP Physician:  Dr. Sallyanne Kuster   Device Type:  Pacemaker and Defibrillator Manufacturer and Phone #:  Medtronic: [id number] Pacemaker Dependent?:  No. Date of Last Device Check:  05/07/22 Remote Normal Device Function?:  Yes.    Electrophysiologist's Recommendations:  Have magnet available. Provide continuous ECG monitoring when magnet is used or reprogramming is to be performed.  Procedure should not interfere with device function.  No device programming or magnet placement needed.  Per Device Clinic Standing Orders, Wanda Plump, RN  3:09 PM 05/12/2022

## 2022-05-12 NOTE — Progress Notes (Signed)
Perioperative device programming orders requested via [employer]. Request sent to [facility].

## 2022-05-12 NOTE — Patient Instructions (Addendum)
Description   Continue taking warfarin 1 tablet daily except for 1/2 tablet on Wednesday and Fridays until 11/23, HOLD warfarin x 5 days prior to procedure.  Post procedure, as long as it's okay with surgeon, restart Warfarin on 11/28 (take an extra 1/2 tablet on 11/28 & 11/29).  Recheck INR 1 week post procedure.  Coumadin Clinic [phone]

## 2022-05-12 NOTE — Pre-Procedure Instructions (Signed)
Surgical Instructions    Your procedure is scheduled on Tuesday 05/23/22.   Report to [facility] Main Entrance "A" at 12:15 P.M., then check in with the Admitting office.  Call this number if you have problems the morning of surgery:  [phone]   If you have any questions prior to your surgery date call [phone]: Open Monday-Friday 8am-4pm If you experience any cold or flu symptoms such as cough, fever, chills, shortness of breath, etc. between now and your scheduled surgery, please notify us at the above number     Remember:  Do not eat after midnight the night before your surgery  You may drink clear liquids until 11:15 A.M. the morning of your surgery.   Clear liquids allowed are: Water, Non-Citrus Juices (without pulp), Carbonated Beverages, Clear Tea, Black Coffee ONLY (NO MILK, CREAM OR POWDERED CREAMER of any kind), and Gatorade    Take these medicines the morning of surgery with A SIP OF WATER:    carvedilol (COREG)   atorvastatin (LIPITOR)    acetaminophen (TYLENOL)- If needed  nitroGLYCERIN (NITROSTAT)- If needed   Please follow your surgeon's instructions regarding warfarin (COUMADIN). If you have not received instructions then please contact your surgeon's office for instructions.   DO NOT TAKE dapagliflozin propanediol (FARXIGA) starting 3 days prior to surgery. Last dose should be taken on 05/19/22.  As of today, STOP taking any Aspirin (unless otherwise instructed by your surgeon) Aleve, Naproxen, Ibuprofen, Motrin, Advil, Goody's, BC's, all herbal medications, fish oil, and all vitamins.           Do not wear jewelry or makeup. Do not wear lotions, powders, perfumes/cologne or deodorant. Do not shave 48 hours prior to surgery.  Men may shave face and neck. Do not bring valuables to the hospital. Do not wear nail polish, gel polish, artificial nails, or any other type of covering on natural nails (fingers and toes) If you have artificial nails or gel  coating that need to be removed by a nail salon, please have this removed prior to surgery. Artificial nails or gel coating may interfere with anesthesia's ability to adequately monitor your vital signs.  [facility] is not responsible for any belongings or valuables.    Do NOT Smoke (Tobacco/Vaping)  24 hours prior to your procedure  If you use a CPAP at night, you may bring your mask for your overnight stay.   Contacts, glasses, hearing aids, dentures or partials may not be worn into surgery, please bring cases for these belongings   For patients admitted to the hospital, discharge time will be determined by your treatment team.   Patients discharged the day of surgery will not be allowed to drive home, and someone needs to stay with them for 24 hours.   SURGICAL WAITING ROOM VISITATION Patients having surgery or a procedure may have no more than 2 support people in the waiting area - these visitors may rotate.   Children under the age of 29 must have an adult with them who is not the patient. If the patient needs to stay at the hospital during part of their recovery, the visitor guidelines for inpatient rooms apply. Pre-op nurse will coordinate an appropriate time for 1 support person to accompany patient in pre-op.  This support person may not rotate.   Please refer to [email or web address] for the visitor guidelines for Inpatients (after your surgery is over and you are in a regular room).    Special instructions:    Oral Hygiene  is also important to reduce your risk of infection.  Remember - BRUSH YOUR TEETH THE MORNING OF SURGERY WITH YOUR REGULAR TOOTHPASTE   [facility]- Preparing For Surgery  Before surgery, you can play an important role. Because skin is not sterile, your skin needs to be as free of germs as possible. You can reduce the number of germs on your skin by washing with CHG (chlorahexidine gluconate) Soap before  surgery.  CHG is an antiseptic cleaner which kills germs and bonds with the skin to continue killing germs even after washing.     Please do not use if you have an allergy to CHG or antibacterial soaps. If your skin becomes reddened/irritated stop using the CHG.  Do not shave (including legs and underarms) for at least 48 hours prior to first CHG shower. It is OK to shave your face.  Please follow these instructions carefully.     Shower the NIGHT BEFORE SURGERY and the MORNING OF SURGERY with CHG Soap.   If you chose to wash your hair, wash your hair first as usual with your normal shampoo. After you shampoo, rinse your hair and body thoroughly to remove the shampoo.  Then [employer] and genitals (private parts) with your normal soap and rinse thoroughly to remove soap.  After that Use CHG Soap as you would any other liquid soap. You can apply CHG directly to the skin and wash gently with a scrungie or a clean washcloth.   Apply the CHG Soap to your body ONLY FROM THE NECK DOWN.  Do not use on open wounds or open sores. Avoid contact with your eyes, ears, mouth and genitals (private parts). Wash Face and genitals (private parts)  with your normal soap.   Wash thoroughly, paying special attention to the area where your surgery will be performed.  Thoroughly rinse your body with warm water from the neck down.  DO NOT shower/wash with your normal soap after using and rinsing off the CHG Soap.  Pat yourself dry with a CLEAN TOWEL.  Wear CLEAN PAJAMAS to bed the night before surgery  Place CLEAN SHEETS on your bed the night before your surgery  DO NOT SLEEP WITH PETS.   Day of Surgery:  Take a shower with CHG soap. Wear Clean/Comfortable clothing the morning of surgery Do not apply any deodorants/lotions.   Remember to brush your teeth WITH YOUR REGULAR TOOTHPASTE.    If you received a COVID test during your pre-op visit, it is requested that you wear a mask when out in public,  stay away from anyone that may not be feeling well, and notify your surgeon if you develop symptoms. If you have been in contact with anyone that has tested positive in the last 10 days, please notify your surgeon.    Please read over the following fact sheets that you were given.

## 2022-05-12 NOTE — Telephone Encounter (Signed)
Anticoagulation nurse Vernie Shanks said patient was asking for a refill on entresto. Entresto 24-26 refill sent.

## 2022-05-15 ENCOUNTER — Other Ambulatory Visit: Payer: Self-pay

## 2022-05-15 ENCOUNTER — Encounter (HOSPITAL_COMMUNITY): Payer: Self-pay

## 2022-05-15 ENCOUNTER — Encounter (HOSPITAL_COMMUNITY)
Admission: RE | Admit: 2022-05-15 | Discharge: 2022-05-15 | Disposition: A | Discharge status: Home / Self Care | Payer: 59 | Source: Ambulatory Visit | Attending: Surgery | Admitting: Surgery

## 2022-05-15 DIAGNOSIS — Z01812 Encounter for preprocedural laboratory examination: Secondary | ICD-10-CM | POA: Insufficient documentation

## 2022-05-15 DIAGNOSIS — I25118 Atherosclerotic heart disease of native coronary artery with other forms of angina pectoris: Secondary | ICD-10-CM

## 2022-05-15 DIAGNOSIS — I5042 Chronic combined systolic (congestive) and diastolic (congestive) heart failure: Secondary | ICD-10-CM

## 2022-05-15 HISTORY — DX: Malignant (primary) neoplasm, unspecified: C80.1

## 2022-05-15 HISTORY — DX: Presence of automatic (implantable) cardiac defibrillator: Z95.810

## 2022-05-15 HISTORY — DX: Pneumonia, unspecified organism: J18.9

## 2022-05-15 NOTE — Progress Notes (Signed)
PCP - Dr. Macarthur Critchley Cardiologist - Dr. Shelva Majestic  PPM/ICD - Medtronic ICD Device Orders - Yes. Placed in patients chart Rep Notified - Rep emailed 05/15/2022  Chest x-ray - 05/09/2022 EKG - 05/09/2022 Stress Test - denies ECHO - 05/09/2022 Cardiac Cath - 11/25/2010  Sleep Study - Per pt, had one a few years ago. Negative for OSA CPAP - n/a  No DM  Last dose of GLP1 agonist- n/a GLP1 instructions: n/a  Blood Thinner Instructions: Pt is to stop taking Warfarin 5 days prior to surgery. Last dose will be 11/22. Aspirin Instructions:n/a  ERAS Protcol - Clear liquids until 1115 morning of surgery PRE-SURGERY Ensure or G2- n/a  COVID TEST- n/a   Anesthesia review: Yes. Cardiac Clearance note 04/28/2022  Patient denies shortness of breath, fever, cough and chest pain at PAT appointment   All instructions explained to the patient, with a verbal understanding of the material. Patient agrees to go over the instructions while at home for a better understanding. Patient also instructed to self quarantine after being tested for COVID-19. The opportunity to ask questions was provided.

## 2022-05-16 ENCOUNTER — Telehealth: Payer: Self-pay

## 2022-05-16 ENCOUNTER — Encounter (HOSPITAL_COMMUNITY): Payer: Self-pay | Admitting: Physician Assistant

## 2022-05-16 LAB — BASIC METABOLIC PANEL
BUN/Creatinine Ratio: 15 (ref 10–24)
BUN: 19 mg/dL (ref 8–27)
CO2: 22 mmol/L (ref 20–29)
Calcium: 9.6 mg/dL (ref 8.6–10.2)
Chloride: 101 mmol/L (ref 96–106)
Creatinine, Ser: 1.3 mg/dL — ABNORMAL HIGH (ref 0.76–1.27)
Glucose: 86 mg/dL (ref 70–99)
Potassium: 4.4 mmol/L (ref 3.5–5.2)
Sodium: 138 mmol/L (ref 134–144)
eGFR: 60 mL/min/{1.73_m2} (ref >59)

## 2022-05-16 NOTE — Telephone Encounter (Signed)
   Pre-operative Risk Assessment    Patient Name: Steven [name]  DOB: 1955/10/27 MRN: [id number]      Request for Surgical Clearance    Procedure:   Hernia Surgery  Date of Surgery:  Clearance TBD                                 Surgeon:  Erroll Luna, MD Surgeon's Group or Practice Name:  [facility] Phone number:  [phone] Fax number:  [phone], Carlene Coria   Type of Clearance Requested:   - Pharmacy:  Hold Warfarin (Coumadin)     Type of Anesthesia:  General    Additional requests/questions:   How should patient hold Warfarin prior to procedure?  Signed, Elsie Lincoln Elzena Muston   05/16/2022, 3:42 PM

## 2022-05-17 ENCOUNTER — Telehealth (HOSPITAL_COMMUNITY): Payer: Self-pay | Admitting: *Deleted

## 2022-05-17 ENCOUNTER — Other Ambulatory Visit: Payer: Self-pay | Admitting: Cardiovascular Disease

## 2022-05-17 NOTE — Telephone Encounter (Signed)
Pre-op covering staff, since initial clearance was giving patient has been hospitalized for acute on chronic CHF. Therefore, surgery is going to need to be postponed and he will need to be seen in our office first so that we can make sure he is well optimized from a cardiac standpoint prior to surgery. He has a follow-up visit with Dr. Claiborne Billings on 05/30/2022. He should not hold his Coumadin until instructed to for new date of surgery. Can you please update both patient and requesting surgeon's office on this. Thank you!  Darreld Mclean, PA-C 05/17/2022 9:29 AM

## 2022-05-17 NOTE — Telephone Encounter (Signed)
LMTCB

## 2022-05-17 NOTE — Telephone Encounter (Signed)
Patient was recently admitted to hospital for acute on chronic CHF. Therefore, he will need an in-office visit prior to surgery. He already has a follow-up visit with Dr. Claiborne Billings scheduled for 05/30/2022. Therefore, pre-op risk assessment can be completed at that time. I will route this to Dr. Claiborne Billings so that he is aware and will add "pre-op" to appointment notes for that visit.  Pharmacy, can you please go ahead and comment on how long Coumadin can be held for upcoming procedure?  I will go ahead and remove from pre-op pool.   Thank you!  Darreld Mclean, PA-C 05/17/2022 9:01 AM

## 2022-05-17 NOTE — Telephone Encounter (Signed)
Called the requesting office and spoke with Merleen Nicely the triage nurse. I informed her that since initial clearance given patient was hospitalized and has patient an upcoming appointment with Dr. Claiborne Billings his Cardiologist on 05/30/22. Clearance will be discussed at time of appointment. She stated that someone from our office called with this information.

## 2022-05-17 NOTE — Telephone Encounter (Signed)
Nicole,RN with [facility] left vm asking if we would sign surgical clearance or gen cards. I called her back at [phone] and no answer/no vm.

## 2022-05-17 NOTE — Telephone Encounter (Signed)
Called patient's home and mobile numbers asking for him to give me a call back at the office. That I need to speak with him about his clearance information

## 2022-05-17 NOTE — Telephone Encounter (Signed)
Duplicate clearance request - already addressed in 04/28/22 phone note. Pt already aware to hold his warfarin for 5 days prior to 11/28 procedure.

## 2022-05-22 ENCOUNTER — Ambulatory Visit (HOSPITAL_COMMUNITY)
Admit: 2022-05-22 | Discharge: 2022-05-22 | Disposition: A | Discharge status: Home / Self Care | Payer: 59 | Attending: Adult Health | Admitting: Adult Health

## 2022-05-22 ENCOUNTER — Encounter (HOSPITAL_COMMUNITY): Payer: Self-pay

## 2022-05-22 ENCOUNTER — Telehealth (HOSPITAL_COMMUNITY): Payer: Self-pay | Admitting: *Deleted

## 2022-05-22 VITALS — BP 100/70 | HR 74 | Wt 161.8 lb

## 2022-05-22 DIAGNOSIS — R0789 Other chest pain: Secondary | ICD-10-CM | POA: Insufficient documentation

## 2022-05-22 DIAGNOSIS — I5042 Chronic combined systolic (congestive) and diastolic (congestive) heart failure: Secondary | ICD-10-CM | POA: Insufficient documentation

## 2022-05-22 DIAGNOSIS — Z7901 Long term (current) use of anticoagulants: Secondary | ICD-10-CM | POA: Insufficient documentation

## 2022-05-22 DIAGNOSIS — Z955 Presence of coronary angioplasty implant and graft: Secondary | ICD-10-CM | POA: Diagnosis not present

## 2022-05-22 DIAGNOSIS — I130 Hypertensive heart and chronic kidney disease with heart failure and stage 1 through stage 4 chronic kidney disease, or unspecified chronic kidney disease: Secondary | ICD-10-CM | POA: Insufficient documentation

## 2022-05-22 DIAGNOSIS — E785 Hyperlipidemia, unspecified: Secondary | ICD-10-CM | POA: Insufficient documentation

## 2022-05-22 DIAGNOSIS — N1831 Chronic kidney disease, stage 3a: Secondary | ICD-10-CM | POA: Insufficient documentation

## 2022-05-22 DIAGNOSIS — I25118 Atherosclerotic heart disease of native coronary artery with other forms of angina pectoris: Secondary | ICD-10-CM | POA: Diagnosis not present

## 2022-05-22 DIAGNOSIS — I513 Intracardiac thrombosis, not elsewhere classified: Secondary | ICD-10-CM | POA: Diagnosis not present

## 2022-05-22 DIAGNOSIS — I2510 Atherosclerotic heart disease of native coronary artery without angina pectoris: Secondary | ICD-10-CM | POA: Insufficient documentation

## 2022-05-22 DIAGNOSIS — Z79899 Other long term (current) drug therapy: Secondary | ICD-10-CM | POA: Insufficient documentation

## 2022-05-22 NOTE — Patient Instructions (Signed)
No Labs done today.  No medication changes were made. Please continue all current medications as prescribed.  Thank you for allowing [place] to provide your heart failure care after your recent hospitalization. Please follow-up as needed.

## 2022-05-22 NOTE — Telephone Encounter (Signed)
Called to confirm Heart & Vascular Transitions of Care appointment at 12 noon on 05/22/22. Patient reminded to bring all medications and pill box organizer with them. Confirmed patient has transportation. Gave directions, instructed to utilize [place] parking.  Confirmed appointment prior to ending call.    Earnestine Leys, BSN, Clinical cytogeneticist Only

## 2022-05-22 NOTE — Progress Notes (Signed)
HEART & VASCULAR TRANSITION OF CARE CONSULT NOTE     Referring Physician:Dr Crenshaw  Primary Care: Primary Cardiologist: Dr Claiborne Billings  EP: Dr  Sallyanne Kuster   HPI: Referred to clinic by Dr Stanford Breed for heart failure consultation.   Mr Steven [name] is a 66 year old with a history of ICM, VT, HLD, apical mural thrombus, CAD, CKD Stage IIIa, subdural hematoma, Medtronic ICD, and chronic combined systolic/diastolic HF.   Originally from [place] and suffered a large out of hospital anterior wall myocardial infarction in January 2012.  He presented after a three-hour delay to the hospital and was taken emergently for cath with DES to  LAD.    Followed by Dr Claiborne Billings and Dr Sallyanne Kuster for many years.   Admitted 05/07/22 with increased lower extremity edema. Diuresed with IV lasix and transitioned and transitioned to 60 mg po lasix. Placed on GDMT. Discharged on 05/10/22. Amlodipine and imdur discontinued.   Overall feeling fine. Has good days and bad days.  Able to walk up 2 flights of stairs. Denies SOB/PND/Orthopnea. No chest pain. Appetite ok. No fever or chills. Weight at home  has been stable. Unable to take full dose of spiro due to chest discomfort.  Taking all medications. Lives alone.   Cardiac Testing  04/2022 Echo EF 20-25% RV moderately reduced, , mural thrombosis.   2021 LVEF 20-25% RV normal  Cath 2012. DES to LAD  Review of Systems: [y] = yes, '[ ]'$  = no   General: Weight gain '[ ]'$ ; Weight loss '[ ]'$ ; Anorexia '[ ]'$ ; Fatigue '[ ]'$ ; Fever '[ ]'$ ; Chills '[ ]'$ ; Weakness '[ ]'$   Cardiac: Chest pain/pressure '[ ]'$ ; Resting SOB '[ ]'$ ; Exertional SOB [ Y]; Orthopnea '[ ]'$ ; Pedal Edema '[ ]'$ ; Palpitations '[ ]'$ ; Syncope '[ ]'$ ; Presyncope '[ ]'$ ; Paroxysmal nocturnal dyspnea'[ ]'$   Pulmonary: Cough '[ ]'$ ; Wheezing'[ ]'$ ; Hemoptysis'[ ]'$ ; Sputum '[ ]'$ ; Snoring '[ ]'$   GI: Vomiting'[ ]'$ ; Dysphagia'[ ]'$ ; Melena'[ ]'$ ; Hematochezia '[ ]'$ ; Heartburn'[ ]'$ ; Abdominal pain '[ ]'$ ; Constipation '[ ]'$ ; Diarrhea '[ ]'$ ; BRBPR '[ ]'$   GU: Hematuria'[ ]'$ ; Dysuria '[ ]'$ ; Nocturia'[ ]'$    Vascular: Pain in legs with walking '[ ]'$ ; Pain in feet with lying flat '[ ]'$ ; Non-healing sores '[ ]'$ ; Stroke '[ ]'$ ; TIA '[ ]'$ ; Slurred speech '[ ]'$ ;  Neuro: Headaches'[ ]'$ ; Vertigo'[ ]'$ ; Seizures'[ ]'$ ; Paresthesias'[ ]'$ ;Blurred vision '[ ]'$ ; Diplopia '[ ]'$ ; Vision changes '[ ]'$   Ortho/Skin: Arthritis '[ ]'$ ; Joint pain [ Y]; Muscle pain '[ ]'$ ; Joint swelling '[ ]'$ ; Back Pain '[ ]'$ ; Rash '[ ]'$   Psych: Depression'[ ]'$ ; Anxiety'[ ]'$   Heme: Bleeding problems '[ ]'$ ; Clotting disorders '[ ]'$ ; Anemia '[ ]'$   Endocrine: Diabetes '[ ]'$ ; Thyroid dysfunction'[ ]'$    Past Medical History:  Diagnosis Date   Acute MI, anterior wall (HCC)    AICD (automatic cardioverter/defibrillator) present    CAD (coronary artery disease)    2D ECHO, 07/13/2011 - EF <25%, LV moderatelty dilated, LA moderately dilatedLEXISCAN, 12/14/2011 - moderate-severe perfusion defect seen in the basal anteroseptal, mid anterior, apicacl anterior, apical, apical inferior, and apical lateral regions, post-stress EF 25%, new EKG changes from baseline abnormalities   [facility]cer [facility])    Prostate   CHF (congestive heart failure) ([place]) 2012   Hypertension 08/08/2021   Inguinal hernia, left    Pneumonia    November 2023   Pre-diabetes     Current Outpatient Medications  Medication Sig Dispense Refill   acetaminophen (TYLENOL) 500 MG tablet Take 1,000 mg by  mouth every 6 (six) hours as needed for mild pain.     atorvastatin (LIPITOR) 80 MG tablet Take 1 tablet (80 mg total) by mouth daily. 90 tablet 3   carvedilol (COREG) 6.25 MG tablet Take 1 tablet (6.25 mg total) by mouth 2 (two) times daily with a meal. 180 tablet 3   Cholecalciferol (VITAMIN D) 125 MCG (5000 UT) CAPS Take 5,000 Units by mouth daily.     dapagliflozin propanediol (FARXIGA) 10 MG TABS tablet Take 10 mg by mouth daily.     furosemide (LASIX) 20 MG tablet Take 3 tablets (60 mg total) by mouth daily. 90 tablet 3   Multiple Vitamin (MULTIVITAMIN) tablet Take 1 tablet by mouth daily.     nitroGLYCERIN (NITROSTAT) 0.4 MG  SL tablet Place 1 tablet (0.4 mg total) under the tongue every 5 (five) minutes as needed. Usual dose is q 5 minutes x 3 doses. 75 tablet 0   Pancrelipase, Lip-Prot-Amyl, (ZENPEP PO) Take 1-4 capsules by mouth with breakfast, with lunch, and with evening meal.     sacubitril-valsartan (ENTRESTO) 24-26 MG Take 1 tablet by mouth 2 (two) times daily. 180 tablet 0   spironolactone (ALDACTONE) 25 MG tablet Take 0.5 tablets (12.5 mg total) by mouth every other day. TAKE EOD ON EVEN DAYS 15 tablet 6   tamsulosin (FLOMAX) 0.4 MG CAPS capsule Take 0.4 mg by mouth at bedtime.     warfarin (COUMADIN) 5 MG tablet 1/2 TO 1 (ONE-HALF TO ONE) ONCE DAILY AS DIRECTED BY THE COUMADIN CLINIC (Patient taking differently: Take 2.5-5 mg by mouth See admin instructions. Taking 2.5 mg on Wed, Friday and all other days taking 5 mg once daily.) 90 tablet 1   No current facility-administered medications for this encounter.    No Known Allergies    Social History   Socioeconomic History   Marital status: Single    Spouse name: Not on file   Number of children: 1   Years of education: Not on file   Highest education level: Bachelor's degree (e.g., BA, AB, BS)  Occupational History   Occupation: works on Social worker  Tobacco Use   Smoking status: Former    Types: Cigarettes, Cigars    Quit date: 2012    Years since quitting: 11.9   Smokeless tobacco: Never  Vaping Use   Vaping Use: Never used  Substance and Sexual Activity   Alcohol use: Yes    Alcohol/week: 2.0 - 3.0 standard drinks of alcohol    Types: 2 - 3 drink(s) per week   Drug use: No   Sexual activity: Not Currently  Other Topics Concern   Not on file  Social History Narrative   Not on file   Social Determinants of Health   Financial Resource Strain: Low Risk  (05/08/2022)   Overall Financial Resource Strain (CARDIA)    Difficulty of Paying Living Expenses: Not hard at all  Food Insecurity: No Food Insecurity (05/08/2022)   Hunger Vital Sign     Worried About Running Out of Food in the Last Year: Never true    Ran Out of Food in the Last Year: Never true  Transportation Needs: No Transportation Needs (05/08/2022)   PRAPARE - Hydrologist (Medical): No    Lack of Transportation (Non-Medical): No  Physical Activity: Not on file  Stress: Not on file  Social Connections: Not on file  Intimate Partner Violence: Not At Risk (05/08/2022)   Humiliation, Afraid, Rape, and Kick questionnaire  Fear of Current or Ex-Partner: No    Emotionally Abused: No    Physically Abused: No    Sexually Abused: No    Vitals:   05/22/22 1205  BP: 100/70  Pulse: 74  SpO2: 100%  Weight: 73.4 kg (161 lb 12.8 oz)   Wt Readings from Last 3 Encounters:  05/22/22 73.4 kg (161 lb 12.8 oz)  05/15/22 74.8 kg (165 lb)  05/10/22 75.9 kg (167 lb 6.4 oz)    PHYSICAL EXAM: General:  Well appearing. No respiratory difficulty HEENT: normal Neck: supple. no JVD. Carotids 2+ bilat; no bruits. No lymphadenopathy or thryomegaly appreciated. Cor: PMI nondisplaced. Regular rate & rhythm. No rubs, gallops or murmurs. Lungs: clear Abdomen: soft, nontender, nondistended. No hepatosplenomegaly. No bruits or masses. Good bowel sounds. Extremities: no cyanosis, clubbing, rash, edema Neuro: alert & oriented x 3, cranial nerves grossly intact. moves all 4 extremities w/o difficulty. Affect pleasant.  ASSESSMENT & PLAN:  1. Chronic HFrEF, ICM  EF has been down for many years. Has [employer] ICD.  Most recent ECHO, 04/2022 EF 20-25% with mod RV dysfunction.  05/09/22 Echo EF  NYHA II. Activity on Optivol ~ 3 hours per day.  GDMT  Diuretic-Volume status stable. Continue lasix 60 mg daily.  Impedance up on Optivol.  BB- Continue current dose of coreg.  Ace/ARB/ARNI- Cotninue current dose of entresto. MRA- Continue current dose of spironolactone. Intolerant higher dose due what he describes as chest discomfort.  SGLT2i- Continue  farxiga 10 mg dialy  Had recent BMET, stable Discussed possible f/u with Advanced Heart Failure and suggested CPX for risk stratification however he would like to discuss with Dr Claiborne Billings.   2. CAD  2013 DES LAD  - On atorvastatin, coumadin, and coreg  - No chest pain.   3. Mural Thrombus  -On coumadin.  -No bleeding issues.    Referred to HFSW (PCP, Medications, Transportation, ETOH Abuse, Drug Abuse, Insurance, Financial ):  No Refer to Pharmacy:  No Refer to Home Health:  No Refer to Advanced Heart Failure Clinic: NO  Refer to General Cardiology: He is a patient of Dr Claiborne Billings  Follow up as needed.   Dat Derksen NP-C  12:11 PM

## 2022-05-23 ENCOUNTER — Ambulatory Visit (HOSPITAL_COMMUNITY): Admission: RE | Admit: 2022-05-23 | Payer: 59 | Source: Home / Self Care | Admitting: Surgery

## 2022-05-23 ENCOUNTER — Ambulatory Visit (INDEPENDENT_AMBULATORY_CARE_PROVIDER_SITE_OTHER): Payer: Medicaid Other

## 2022-05-23 DIAGNOSIS — I255 Ischemic cardiomyopathy: Secondary | ICD-10-CM

## 2022-05-23 SURGERY — REPAIR, HERNIA, INGUINAL, ADULT
Anesthesia: General | Laterality: Right

## 2022-05-24 LAB — CUP PACEART REMOTE DEVICE CHECK
Battery Remaining Longevity: 85 mo
Battery Voltage: 3 V
Brady Statistic AP VP Percent: 0.05 %
Brady Statistic AP VS Percent: 60.93 %
Brady Statistic AS VP Percent: 0.01 %
Brady Statistic AS VS Percent: 39.01 %
Brady Statistic RA Percent Paced: 60.54 %
Brady Statistic RV Percent Paced: 0.06 %
Date Time Interrogation Session: 20231128052825
HighPow Impedance: 48 Ohm
HighPow Impedance: 67 Ohm
Implantable Lead Connection Status: 753985
Implantable Lead Connection Status: 753985
Implantable Lead Implant Date: 20120906
Implantable Lead Implant Date: 20120906
Implantable Lead Location: 753859
Implantable Lead Location: 753860
Implantable Lead Model: 185
Implantable Lead Model: 5076
Implantable Lead Serial Number: 358872
Implantable Pulse Generator Implant Date: 20200831
Lead Channel Impedance Value: 323 Ohm
Lead Channel Impedance Value: 342 Ohm
Lead Channel Impedance Value: 399 Ohm
Lead Channel Pacing Threshold Amplitude: 0.5 V
Lead Channel Pacing Threshold Amplitude: 0.625 V
Lead Channel Pacing Threshold Pulse Width: 0.4 ms
Lead Channel Pacing Threshold Pulse Width: 0.4 ms
Lead Channel Sensing Intrinsic Amplitude: 2 mV
Lead Channel Sensing Intrinsic Amplitude: 2 mV
Lead Channel Sensing Intrinsic Amplitude: 4.625 mV
Lead Channel Sensing Intrinsic Amplitude: 4.625 mV
Lead Channel Setting Pacing Amplitude: 1.5 V
Lead Channel Setting Pacing Amplitude: 2.5 V
Lead Channel Setting Pacing Pulse Width: 0.4 ms
Lead Channel Setting Sensing Sensitivity: 0.3 mV
Zone Setting Status: 755011
Zone Setting Status: 755011

## 2022-05-29 ENCOUNTER — Encounter (HOSPITAL_COMMUNITY): Payer: 59

## 2022-05-30 ENCOUNTER — Ambulatory Visit: Payer: 59 | Attending: Cardiovascular Disease | Admitting: Cardiovascular Disease

## 2022-05-30 ENCOUNTER — Ambulatory Visit (INDEPENDENT_AMBULATORY_CARE_PROVIDER_SITE_OTHER): Payer: 59 | Admitting: *Deleted

## 2022-05-30 VITALS — BP 120/78 | HR 69 | Ht 70.0 in | Wt 162.4 lb

## 2022-05-30 DIAGNOSIS — Z7901 Long term (current) use of anticoagulants: Secondary | ICD-10-CM

## 2022-05-30 DIAGNOSIS — I25118 Atherosclerotic heart disease of native coronary artery with other forms of angina pectoris: Secondary | ICD-10-CM

## 2022-05-30 DIAGNOSIS — Z95810 Presence of automatic (implantable) cardiac defibrillator: Secondary | ICD-10-CM

## 2022-05-30 DIAGNOSIS — I5042 Chronic combined systolic (congestive) and diastolic (congestive) heart failure: Secondary | ICD-10-CM

## 2022-05-30 DIAGNOSIS — E785 Hyperlipidemia, unspecified: Secondary | ICD-10-CM

## 2022-05-30 DIAGNOSIS — I513 Intracardiac thrombosis, not elsewhere classified: Secondary | ICD-10-CM

## 2022-05-30 LAB — POCT INR: INR: 2.3 (ref 2.0–3.0)

## 2022-05-30 NOTE — Patient Instructions (Signed)
Medication Instructions:  Continue current medications  *If you need a refill on your cardiac medications before your next appointment, please call your pharmacy*   Lab Work: None Ordered   Testing/Procedures: None Ordered   Follow-Up: At [facility], you and your health needs are our priority.  As part of our continuing mission to provide you with exceptional heart care, we have created designated Provider Care Teams.  These Care Teams include your primary Cardiologist (physician) and Advanced Practice Providers (APPs -  Physician Assistants and Nurse Practitioners) who all work together to provide you with the care you need, when you need it.  We recommend signing up for the patient portal called "MyChart".  Sign up information is provided on this After Visit Summary.  MyChart is used to connect with patients for Virtual Visits (Telemedicine).  Patients are able to view lab/test results, encounter notes, upcoming appointments, etc.  Non-urgent messages can be sent to your provider as well.   To learn more about what you can do with MyChart, go to [email or web address].    Your next appointment:   6 week(s)  The format for your next appointment:   In Person  Provider:   Almyra Deforest, PA-C    Then, Shelva Majestic, MD will plan to see you again in 3 month(s).    Other Instructions

## 2022-05-30 NOTE — Patient Instructions (Signed)
Description   Continue taking warfarin 1 tablet daily except for 1/2 tablet on Wednesday and Fridays. Recheck INR in 4 weeks. Coumadin Clinic [phone]

## 2022-05-30 NOTE — Progress Notes (Signed)
**Note Steven [name]** Cardiology Office Note    Date:  06/06/2022   ID:  Steven, [name] 1956/01/16, MRN [id number]  PCP:  Macarthur Critchley, MD  Cardiologist:  Shelva Majestic, MD   3 week follow-up evaluation since hospitalization  History of Present Illness:  Steven [name] is a 66 y.o. male with documented severe ischemic cardiomyopathy following an extensive anterior myocardial infarction with subsequent defibrillator for primary prevention.    He presents for 15 month follow-up cardiology evaluation.  Steven [name] is originally from [place] and suffered a large out of hospital anterior wall myocardial infarction in January 2012. [name]  He presented after a three-hour delay to the hospital and was taken emergently to the cardiac catheterization laboratory where I performed successful emergent intervention with the door to balloon time of only 25 minutes. His LAD was successfully recanalized. CPK increased to 8012 with an MB of 716 and troponin was greater than 100 initially. He subsequently wore a life vest and ultimately after several months without significant improvement of LV function he underwent implantable cardio defibrillator insertion.  Interrogation of his defibrillator in the past had shown episodes of nonsustained VT and his dose of carvedilol has been further titrated.   A followup echo Doppler study on 08/04/2013 showed an ejection fraction of 20-25% with diffuse hypokinesis but there was akinesis of the entire anteroseptal myocardium and apical region. There now appear to be an apparent medium-sized mural apical thrombus which was not noted on his last echo. He denies paresthesias. He denies episodes of chest pain. He denies shortness of breath.At his last office visit in February in light of his moderate sized mural thrombus, Plavix, was discontinued, and he was started on Coumadin therapy.   In August 2015 he tripped and hit his head on a bar recently developed severe headache, transient leg weakness,  which evolves into slurred speech.  He underwent evaluation 1-1/2 weeks later and was found  to have a subdural hematoma.  He underwent a right burr hole subdural hematoma evacuation on 02/05/2014 by [name] at [facility].  Initially aspirin and Coumadin were on hold.  He had a follow-up CT scan and ultimately was given clearance to reinstitute Coumadin anticoagulation in light of his severe cardiomyopathy with an ejection fraction of 15-20% on echo on 03/13/2014 and medium-sized apical mural thrombus which had been documented on a previous echo.  This ultimately has stabilized and he has been back on warfarin anticoagulation.     When I evaluated him in 2016 he denied any chest pain, shortness of breath or recurrent palpitations.  His ICD was  last interrogated one month ago by Dr. [name].  He was noted to have very short nonsustained bursts of nonsustained VT with one episode lasting 8 beats at 212 bpm on 07/09/2014.  His device was adjusted so as it would intervene more quickly and avoid syncope.     I saw him in September 2018 and prior to that evaluation had not seen him since 2016.  Over this time period, he has been without recurrent chest pain.  At times he has had some occasional dizziness and weakness.  He saw Dr. Sallyanne Kuster on August 09 2016.  His device was interrogated and showed 72% atrial pacing and no ventricular pacing.  There was one 8 beat episode of nonsustained VT since his last device check.  His last echo Doppler study was in September 2015.  He was evaluated by Dr.  Eskridge for insertion of a penile implantation.  Preoperative clearance was given by Dr. Sallyanne Kuster was advised that he hold his warfarin and he will stop his aspirin permanently.  He was  seen at [facility] and was felt to have stable stage III chronic kidney disease.  His blood pressure was controlled and he was euvolemic.    He underwent an echo Doppler study on April 18, 2017.  This  continued to show reduced ejection fraction at 20-25%.  He had significant wall motion abnormality with akinesis of the apical anterior septum, inferolateral wall, and the entire apex.  There was grade 1 diastolic dysfunction.  He was seen by Dr. Sallyanne Kuster in February 2019.  At that time, his ICD had normal device function.  He has been undergoing remote downloads every 3 months via [facility].  He has not experienced any episodes of recent angina.  He was found to have rare episodes of nonsustained VT only 3 in the last 12 months.  At times he notes some mild dizziness if he stands up very fast.   I saw him in April 2019.  Since that time, he has had evaluations with Dr. Sallyanne Kuster and was not device dependent but had 80% atrial pacing.  His device had recorded lengthy episodes of NSVT but had not delivered therapies.  On February 24, 2019 he underwent dual-chamber ICD generator change out due to the device reaching ERI.  He saw Dr. Sallyanne Kuster in follow-up in November 2020.  He has had issues with mild exertionally precipitated chest tightness which has been a chronic problem for some time and over several follow-up visits with Dr. Sallyanne Kuster he was started on isosorbide and ultimately titrated to 120 mg daily.   I had not seen him since 2019 but evaluated him in a telemedicine visit on December 04, 2019.  At that time he continued to have chronic class II stable anginal symptomatology precipitated by increasing activity.    During that evaluation I recommend the addition of low-dose amlodipine 2.5 mg and he feels this improved his mild chronic anginal symptomatology.   He is unaware of any recurrent palpitations or defibrillator discharge.  He has continued to be on warfarin anticoagulation.    I saw him in October 2021 and since his prior evaluation he had undergone a prostate biopsy for which warfarin was held.  He is followed by Dr. Junious Silk.  He underwent a repeat echo Doppler study on January 05, 2020 which continue to  show very low EF at 20 to 25% with global hypokinesis.  Estimated RV pressure was normal at 23 mm.  He continued to experience some exertional dyspnea and denied  any chest pain.  During my evaluation, I recommended transition to [employer] and he was instructed to discontinue lisinopril for a minimum of 36 hours prior to initiation.  He has been on Farxiga both for his borderline diabetes mellitus as well as CHF.  He was evaluated by Dr. Sallyanne Kuster on May 17, 2020 and had functional class I-II was euvolemic clinically and by OptiVol measurements.  Historicallyhe has had approximately 1 episode of nonsustained VT every month but had only 4 events in the last 11 months.  When I saw him on July 21, 2020 he denied any chest pain but had  noticed some mild dizziness.  He was on amlodipine 2.5 mg daily, carvedilol 25 mg twice a day, Farxiga 10 mg, Entresto 24/26 mg twice a day, furosemide 40 mg, isosorbide 120 mg, spironolactone 12.5 mg.  He denied any leg swelling or increasing shortness of breath.  During that evaluation his blood pressure was low and he was euvolemic.  I suggested he reduce his furosemide to 20 mg and reduce spironolactone from 12.5 mg twice a day down to 12.5 mg daily.  He was evaluated by Joslyn Hy in the office on September 01, 2020.  At that time, his blood pressure remained soft precluding further titration of Entresto.  I saw him on December 28, 2020 at which time he continued to feel well and denied any recurrent anginal symptoms or dizziness.  He was unaware of any palpitations.  He continued to be on amlodipine 2.5 mg daily, carvedilol 25 mg twice a day, Farxiga 10 mg daily, Entresto 24/26 mg twice a day, furosemide 20 mg daily, isosorbide 120 mg daily, and spironolactone 12.5 mg daily.  He was on warfarin for anticoagulation followed in the Coumadin clinic.  He was taking Stiolto Respimat and as needed albuterol.   He was evaluated by Dr. Sallyanne Kuster on date May 30, 2021 and  remained stable.  Clinically he was euvolemic.  He continued to be on 3 antianginal medications including amlodipine carvedilol and isosorbide.  He had normal device function and had rare nonsustained VT episodes.  I last saw him on April 14, 2022.  At that time he felt well and denied any significant shortness of breath or awareness of palpitations.  At times he has experienced a vague chest sensation with walking.  He continues to be on amlodipine 2.5 mg, carvedilol 25 mg twice a day, furosemide 20 mg daily, isosorbide 120 mg in the morning, in addition to Entresto 24/26 mg twice a day.  He is on warfarin.  He has been taking spironolactone 25 mg daily.  During that evaluation, his blood pressure was low and he was euvolemic.  Blood pressure decreased to 90/60 standing.  I recommended slight reduction of spironolactone down to 12.5 mg and take every other day with continuation of his daily Lasix.  He was hospitalized from November 12 to May 10, 2022 with acute on chronic systolic heart failure he underwent follow-up echo Doppler study which showed an adherent mass at the inferoseptal apical portion consistent with laminated/chronic thrombus without multiple aspects.  EF was less than 20%.  There was mildly elevated pulmonary artery systolic pressure and biatrial enlargement.  Upon presentation he was treated with BiPAP therapy for worsening dyspnea.  During his hospitalization amlodipine and Imdur were discontinued in hopes of future increase in [place] dosage.    Subsequent to his hospitalization he was seen by Darrick Grinder in heart failure clinic on May 22, 2022.[name]  His volume status was stable and he continues to be on Lasix 60 mg daily.  Impedance was up on OptiVol.  He was advised to continue his 6.25 twice daily regimen of carvedilol, 24/26 mg twice a day dose of Entresto and continue the spironolactone at the 12.5 every other day dose.  He was on Farxiga 10 mg daily.  He continues to be on  warfarin with his minimal thrombus.  Presently, he feels better.  He denies any lightheadedness.  He is unaware of palpitations.  His medical regimen is as noted from his heart failure clinic note of May 22, 2022.  He presents for reevaluation.   Past Medical History:  Diagnosis Date   Acute MI, anterior wall (HCC)    AICD (automatic cardioverter/defibrillator) present    CAD (coronary artery disease)    2D ECHO, 07/13/2011 -  EF <25%, LV moderatelty dilated, LA moderately dilatedLEXISCAN, 12/14/2011 - moderate-severe perfusion defect seen in the basal anteroseptal, mid anterior, apicacl anterior, apical, apical inferior, and apical lateral regions, post-stress EF 25%, new EKG changes from baseline abnormalities   Cancer [facility])    Prostate   CHF (congestive heart failure) ([place]) 2012   Hypertension 08/08/2021   Inguinal hernia, left    Pneumonia    November 2023   Pre-diabetes     Past Surgical History:  Procedure Laterality Date   CARDIAC CATHETERIZATION  11/25/2010   Predilation balloon-Apex monorail 2x28m, Cutting balloon-2.25x1109m resulting in a reduction of 100% stenosis down to less than 10%   CARDIAC CATHETERIZATION  07/12/2010   LAD stented with a 3.5x2456mare-metal Veriflex stent resulting in a reduction of 100% lesion to 0%   CARDIAC DEFIBRILLATOR PLACEMENT  03/02/2011   Medtronic Protecta XT DR, model #D3#T625WLSerial #PS#[id number]  COLONOSCOPY     GOLD SEED IMPLANT N/A 07/21/2021   Procedure: GOLD SEED IMPLANT;  Surgeon: BorRaynelle BringD;  Location: [facility];  Service: Urology;  Laterality: N/A;  NEEDS 30 MIN   HERNIA REPAIR  2018   ICD GENERATOR CHANGEOUT N/A 02/24/2019   Procedure: ICD GENERATOR CHANGEOUT;  Surgeon: CroSanda KleinD;  Location: [facility];  Service: Cardiovascular;  Laterality: N/A;   PACEMAKER PLACEMENT  07/14/2010   Temporary placement of pacemaker, if rhythm issue continues will need a permanent device   SPACE OAR INSTILLATION N/A  07/21/2021   Procedure: SPACE OAR INSTILLATION;  Surgeon: BorRaynelle BringD;  Location: [facility];  Service: Urology;  Laterality: N/A;    Current Medications: Outpatient Medications Prior to Visit  Medication Sig Dispense Refill   acetaminophen (TYLENOL) 500 MG tablet Take 1,000 mg by mouth every 6 (six) hours as needed for mild pain.     atorvastatin (LIPITOR) 80 MG tablet Take 1 tablet (80 mg total) by mouth daily. 90 tablet 3   carvedilol (COREG) 6.25 MG tablet Take 1 tablet (6.25 mg total) by mouth 2 (two) times daily with a meal. 180 tablet 3   Cholecalciferol (VITAMIN D) 125 MCG (5000 UT) CAPS Take 5,000 Units by mouth daily.     dapagliflozin propanediol (FARXIGA) 10 MG TABS tablet Take 10 mg by mouth daily.     furosemide (LASIX) 20 MG tablet Take 3 tablets (60 mg total) by mouth daily. 90 tablet 3   Multiple Vitamin (MULTIVITAMIN) tablet Take 1 tablet by mouth daily.     nitroGLYCERIN (NITROSTAT) 0.4 MG SL tablet Place 1 tablet (0.4 mg total) under the tongue every 5 (five) minutes as needed. Usual dose is q 5 minutes x 3 doses. 75 tablet 0   Pancrelipase, Lip-Prot-Amyl, (ZENPEP PO) Take 1-4 capsules by mouth with breakfast, with lunch, and with evening meal.     sacubitril-valsartan (ENTRESTO) 24-26 MG Take 1 tablet by mouth 2 (two) times daily. 180 tablet 0   spironolactone (ALDACTONE) 25 MG tablet Take 0.5 tablets (12.5 mg total) by mouth every other day. TAKE EOD ON EVEN DAYS 15 tablet 6   tamsulosin (FLOMAX) 0.4 MG CAPS capsule Take 0.4 mg by mouth at bedtime.     warfarin (COUMADIN) 5 MG tablet 1/2 TO 1 (ONE-HALF TO ONE) ONCE DAILY AS DIRECTED BY THE COUMADIN CLINIC (Patient taking differently: Take 2.5-5 mg by mouth See admin instructions. Taking 2.5 mg on Wed, Friday and all other days taking 5 mg once daily.) 90 tablet 1   No facility-administered medications prior  to visit.     Allergies:   Patient has no known allergies.   Social History   Socioeconomic History    Marital status: Single    Spouse name: Not on file   Number of children: 1   Years of education: Not on file   Highest education level: Bachelor's degree (e.g., BA, AB, BS)  Occupational History   Occupation: works on Social worker  Tobacco Use   Smoking status: Former    Types: Cigarettes, Cigars    Quit date: 2012    Years since quitting: 11.9   Smokeless tobacco: Never  Vaping Use   Vaping Use: Never used  Substance and Sexual Activity   Alcohol use: Yes    Alcohol/week: 2.0 - 3.0 standard drinks of alcohol    Types: 2 - 3 drink(s) per week   Drug use: No   Sexual activity: Not Currently  Other Topics Concern   Not on file  Social History Narrative   Not on file   Social Determinants of Health   Financial Resource Strain: Low Risk  (05/08/2022)   Overall Financial Resource Strain (CARDIA)    Difficulty of Paying Living Expenses: Not hard at all  Food Insecurity: No Food Insecurity (05/08/2022)   Hunger Vital Sign    Worried About Running Out of Food in the Last Year: Never true    Ran Out of Food in the Last Year: Never true  Transportation Needs: No Transportation Needs (05/08/2022)   PRAPARE - Hydrologist (Medical): No    Lack of Transportation (Non-Medical): No  Physical Activity: Not on file  Stress: Not on file  Social Connections: Not on file    Socially he is single, he was born in [place].  He has 1 child.  There is remote tobacco history.    Family History:  He is from [place].  ROS General: Negative; No fevers, chills, or night sweats;  HEENT: Negative; No changes in vision or hearing, sinus congestion, difficulty swallowing Pulmonary: No recent wheezing Cardiovascular: See HPI GI: Negative; No nausea, vomiting, diarrhea, or abdominal pain GU: Positive for prostate biopsy, erectile dysfunction; No dysuria, hematuria, or difficulty voiding Musculoskeletal: Negative; no myalgias, joint pain, or weakness Hematologic/Oncology:  Negative; no easy bruising, bleeding Endocrine: Negative; no heat/cold intolerance; no diabetes Neuro: Negative; no changes in balance, headaches Skin: Negative; No rashes or skin lesions Psychiatric: Negative; No behavioral problems, depression Sleep: Negative; No snoring, daytime sleepiness, hypersomnolence, bruxism, restless legs, hypnogognic hallucinations, no cataplexy Other comprehensive 14 point system review is negative.   PHYSICAL EXAM:   VS:  BP 120/78   Pulse 69   Ht _0  (1.778 m)   Wt 162 lb 6.4 oz (73.7 kg)   SpO2 100%   BMI 23.30 kg/m     Repeat blood pressure by me was 93-734 systolic over 64.  Wt Readings from Last 3 Encounters:  05/30/22 162 lb 6.4 oz (73.7 kg)  05/22/22 161 lb 12.8 oz (73.4 kg)  05/15/22 165 lb (74.8 kg)    General: Alert, oriented, no distress.  Skin: normal turgor, no rashes, warm and dry HEENT: Normocephalic, atraumatic. Pupils equal round and reactive to light; sclera anicteric; extraocular muscles intact;  Nose without nasal septal hypertrophy Mouth/Parynx benign; Mallinpatti scale 3 Neck: No JVD, no carotid bruits; normal carotid upstroke Lungs: clear to ausculatation and percussion; no wheezing or rales Chest wall: without tenderness to palpitation Heart: PMI not displaced, RRR, s1 s2 normal, 1/6 systolic murmur,  no diastolic murmur, no rubs, gallops, thrills, or heaves Abdomen: soft, nontender; no hepatosplenomehaly, BS+; abdominal aorta nontender and not dilated by palpation. Back: no CVA tenderness Pulses 2+ Musculoskeletal: full range of motion, normal strength, no joint deformities Extremities: no clubbing cyanosis or edema, Homan's sign negative  Neurologic: grossly nonfocal; Cranial nerves grossly wnl Psychologic: Normal mood and affect    Studies/Labs Reviewed:   May 30, 2022 ECG (independently read by me): Sinus rhythm, LAD IVCD, old anterior MI  April 14, 2022 ECG (independently read by me): Atrial paced, PR  228 msec, old anterior infarct, inferolateral t changes    December 28, 2020 ECG (independently read by me):  Atrial paced, PR 216; QS V1-4 c/w prior anterior MI; PVC, QTc 418  July 21, 2020 ECG (independently read by me): Atrial paced, prolonged AV conduction,PR 246 msec , LAHB, Anterolateral ST changes  April 12, 2020 ECG (independently read by me): Atrial paced at 63; old anterolateral infarct,  LAHB, QTc 417 msec  September 27 2019 ECG (independently read by me): Atrially paced rhythm at 72 bpm.  One PVC.  Poor progression consistent with old anterior infarction.  Anterolateral ST-T changes   September 2016 ECG (independently read by me): Atrially paced rhythm at 64 bpm.  No ectopy.  Poor anterior R-wave progression concordant with old anterior MI.   March 2016 ECG (independently read by me): Atrially paced rhythm at 74 bpm.  Increased PR interval at 212 ms.  ST-T changes anterolaterally.   November 2015 ECG (independently read by me): Atrially paced rhythm.  Old anterior wall MI.  Per Ms. Lee noted ST-T changes laterally   September 2015 ECG (independently read by me): Sinus rhythm at 63 beats per minute.  Old anterior wall myocardial infarction, poor R. progression V1 through V4 and T-wave changes V3 through V6, leads 1, and L.    August 18, 2013 ECG (independently read by me):  Atrially paced at 64 beats per minute. QTc interval 427 ms. QRS duration 98 ms. Diffuse anterolateral T-wave changes secondary to his prior MI     Prior ECG of 02/27/2013: Atrial  paced rhythm at 64 beats per minute. Evidence for his prior anterolateral wall myocardial infarction with diffuse T-wave abnormality and precordial Q waves V1 through V5    Recent Labs:    Latest Ref Rng & Units 05/15/2022    4:32 PM 05/10/2022    2:35 AM 05/09/2022    1:53 AM  BMP  Glucose 70 - 99 mg/dL 86  110  104   BUN 8 - 27 mg/dL _0 Creatinine 0.76 - 1.27 mg/dL 1.30  1.46  1.45   BUN/Creat Ratio 10 - 24 15      Sodium 134 - 144 mmol/L 138  136  139   Potassium 3.5 - 5.2 mmol/L 4.4  3.9  3.5   Chloride 96 - 106 mmol/L 101  106  109   CO2 20 - 29 mmol/L _1 Calcium 8.6 - 10.2 mg/dL 9.6  8.5  8.3         Latest Ref Rng & Units 05/07/2022    7:42 AM 04/18/2022    9:58 AM 04/23/2020    9:15 AM  Hepatic Function  Total Protein 6.5 - 8.1 g/dL 6.6  7.3  7.3   Albumin 3.5 - 5.0 g/dL 3.3  4.3  4.3   AST 15 - 41 U/L 46  22  25  ALT 0 - 44 U/L _0 Alk Phosphatase 38 - 126 U/L 62  86  65   Total Bilirubin 0.3 - 1.2 mg/dL 0.7  0.8  0.7        Latest Ref Rng & Units 05/07/2022    7:42 AM 04/18/2022    9:58 AM 07/20/2021    1:55 PM  CBC  WBC 4.0 - 10.5 K/uL 7.0  6.0  5.1   Hemoglobin 13.0 - 17.0 g/dL 13.7  15.3  13.7   Hematocrit 39.0 - 52.0 % 43.5  44.9  40.7   Platelets 150 - 400 K/uL 221  166  170    Lab Results  Component Value Date   MCV 100.7 (H) 05/07/2022   MCV 92 04/18/2022   MCV 97.6 07/20/2021   Lab Results  Component Value Date   TSH 2.280 04/23/2020   Lab Results  Component Value Date   HGBA1C 6.4 (H) 05/08/2022     BNP    Component Value Date/Time   BNP 1,303.7 (H) 05/07/2022 0742   BNP 124.2 (H) 09/03/2014 0803    ProBNP    Component Value Date/Time   PROBNP 587 (H) 04/18/2022 0958   PROBNP 9719.0 (H) 11/23/2010 2006     Lipid Panel     Component Value Date/Time   CHOL 121 04/18/2022 0958   CHOL 161 07/01/2013 0838   TRIG 50 04/18/2022 0958   TRIG 108 07/01/2013 0838   HDL 45 04/18/2022 0958   HDL 43 07/01/2013 0838   CHOLHDL 2.7 04/18/2022 0958   CHOLHDL 3.3 09/03/2014 0803   VLDL 16 09/03/2014 0803   LDLCALC 64 04/18/2022 0958   LDLCALC 96 07/01/2013 0838   LABVLDL 12 04/18/2022 0958     RADIOLOGY: CUP PACEART REMOTE DEVICE CHECK  Result Date: 05/24/2022 Scheduled remote reviewed. Normal device function.  Next remote 91 days. LA  US THYROID  Result Date: 05/13/2022 CLINICAL DATA:  Left neck cyst, follow-up exam  EXAM: THYROID ULTRASOUND TECHNIQUE: Ultrasound examination of the thyroid gland and adjacent soft tissues was performed. COMPARISON:  05/13/2021, 04/25/2021 FINDINGS: Parenchymal Echotexture: Normal Isthmus: 3 mm Right lobe: 5.0 x 1.5 x 1.5 cm Left lobe: 5.3 x 1.6 x 1.6 cm _________________________________________________________ Estimated total number of nodules >/= 1 cm: 0 Number of spongiform nodules >/=  2 cm not described below (TR1): 0 Number of mixed cystic and solid nodules >/= 1.5 cm not described below ([place]): 0 _________________________________________________________ No discrete nodules are seen within the thyroid gland. There is redemonstration of a benign-appearing well-circumscribed cystic lesion inferior and separate from the left thyroid lobe measuring 2.3 x 1.7 x 1.9 cm, previously 2.6 x 1.6 x 1.9 cm. No associated vascularity. No regional adenopathy. IMPRESSION: Normal thyroid ultrasound Stable benign-appearing 2.3 cm cystic lesion inferior to the left thyroid lobe as previously described by ultrasound and CT on the 2022 exams. The above is in keeping with the ACR TI-RADS recommendations - J Am Coll Radiol 2017;14:587-595. Electronically Signed   By: Jerilynn Mages.  Shick M.D.   On: 05/13/2022 09:07   ECHOCARDIOGRAM COMPLETE  Result Date: 05/09/2022    ECHOCARDIOGRAM REPORT   Patient Name:   Steven [name] [facility] Date of Exam: 05/09/2022 Medical Rec #:  [id number]    Height:       69.0 in Accession #:    [id number]   Weight:       168.3 lb Date of Birth:  1955-12-29    BSA:  1.920 m Patient Age:    21 years     BP:           100/72 mmHg Patient Gender: M            HR:           64 bpm. Exam Location:  Inpatient Procedure: 2D Echo, Cardiac Doppler, Color Doppler and Intracardiac            Opacification Agent Indications:    CHF-Acute Systolic Z61.09  History:        Patient has prior history of Echocardiogram examinations, most                 recent 01/05/2020. CHF and Cardiomyopathy, CAD and Previous                  Myocardial Infarction, Arrythmias:Bradycardia,                 Signs/Symptoms:Shortness of Breath; Risk Factors:Dyslipidemia,                 Hypertension and Former Smoker.  Sonographer:    Greer Pickerel Referring Phys: [place] Comments: Image acquisition challenging due to patient body habitus and Image acquisition challenging due to respiratory motion. IMPRESSIONS  1. On contrast images, there is an adherent mass at the inferoseptal apical portion that is consistent with laminated/chronic thrombus. No mobile aspects seen and not appreciated in other views. Left ventricular ejection fraction, by estimation, is <20%. The left ventricle has severely decreased function. The left ventricle demonstrates global hypokinesis. The left ventricular internal cavity size was moderately dilated. Left ventricular diastolic parameters are indeterminate.  2. Right ventricular systolic function is moderately reduced. The right ventricular size is normal. There is mildly elevated pulmonary artery systolic pressure.  3. Left atrial size was mild to moderately dilated.  4. Right atrial size was mildly dilated.  5. The mitral valve is normal in structure. Trivial mitral valve regurgitation. No evidence of mitral stenosis.  6. The aortic valve is grossly normal. Aortic valve regurgitation is not visualized. No aortic stenosis is present.  7. The inferior vena cava is dilated in size with <50% respiratory variability, suggesting right atrial pressure of 15 mmHg. Comparison(s): Changes from prior study are noted. Prior EF 20-25%, now <20%. Conclusion(s)/Recommendation(s): Adherent LV thrombus seen at inferoseptal apical segment. While patient has known prior LV thrombus, I did not see these on echo contrast images from 2021 study. Findings communicated to Dr. Stanford Breed. FINDINGS  Left Ventricle: On contrast images, there is an adherent mass at the inferoseptal apical portion that is consistent with  laminated/chronic thrombus. No mobile aspects seen and not appreciated in other views. Left ventricular ejection fraction, by estimation, is <20%. The left ventricle has severely decreased function. The left ventricle demonstrates global hypokinesis. Definity contrast agent was given IV to delineate the left ventricular endocardial borders. The left ventricular internal cavity size was moderately dilated. There is no left ventricular hypertrophy. Left ventricular diastolic parameters are indeterminate. Right Ventricle: The right ventricular size is normal. Right vetricular wall thickness was not well visualized. Right ventricular systolic function is moderately reduced. There is mildly elevated pulmonary artery systolic pressure. The tricuspid regurgitant velocity is 2.59 m/s, and with an assumed right atrial pressure of 15 mmHg, the estimated right ventricular systolic pressure is 60.4 mmHg. Left Atrium: Left atrial size was mild to moderately dilated. Right Atrium: Right atrial size was mildly dilated. Pericardium: There is no  evidence of pericardial effusion. Mitral Valve: The mitral valve is normal in structure. Trivial mitral valve regurgitation. No evidence of mitral valve stenosis. Tricuspid Valve: The tricuspid valve is grossly normal. Tricuspid valve regurgitation is trivial. No evidence of tricuspid stenosis. Aortic Valve: The aortic valve is grossly normal. Aortic valve regurgitation is not visualized. No aortic stenosis is present. Pulmonic Valve: The pulmonic valve was not well visualized. Pulmonic valve regurgitation is not visualized. No evidence of pulmonic stenosis. Aorta: The aortic root and ascending aorta are structurally normal, with no evidence of dilitation. Venous: The inferior vena cava is dilated in size with less than 50% respiratory variability, suggesting right atrial pressure of 15 mmHg. IAS/Shunts: The interatrial septum was not well visualized. Additional Comments: A device lead is  visualized.  LEFT VENTRICLE PLAX 2D LVIDd:         5.90 cm   Diastology LVIDs:         5.70 cm   LV e' medial:    5.43 cm/s LV PW:         1.00 cm   LV E/e' medial:  13.4 LV IVS:        0.30 cm   LV e' lateral:   12.20 cm/s LVOT diam:     1.90 cm   LV E/e' lateral: 5.9 LV SV:         54 LV SV Index:   28 LVOT Area:     2.84 cm  RIGHT VENTRICLE RV S prime:     6.81 cm/s TAPSE (M-mode): 0.9 cm LEFT ATRIUM             Index        RIGHT ATRIUM           Index LA diam:        3.40 cm 1.77 cm/m   RA Area:     17.80 cm LA Vol (A2C):   70.8 ml 36.88 ml/m  RA Volume:   43.30 ml  22.55 ml/m LA Vol (A4C):   74.2 ml 38.65 ml/m LA Biplane Vol: 76.6 ml 39.90 ml/m  AORTIC VALVE LVOT Vmax:   94.60 cm/s LVOT Vmean:  66.200 cm/s LVOT VTI:    0.191 m  AORTA Ao Root diam: 3.30 cm Ao Asc diam:  3.50 cm MITRAL VALVE               TRICUSPID VALVE MV Area (PHT): 4.24 cm    TR Peak grad:   26.8 mmHg MV Decel Time: 179 msec    TR Vmax:        259.00 cm/s MV E velocity: 72.50 cm/s MV A velocity: 47.10 cm/s  SHUNTS MV E/A ratio:  1.54        Systemic VTI:  0.19 m                            Systemic Diam: 1.90 cm Buford Dresser MD Electronically signed by Buford Dresser MD Signature Date/Time: 05/09/2022/6:12:15 PM    Final    DG Chest 2 View  Result Date: 05/09/2022 CLINICAL DATA:  Shortness of breath EXAM: CHEST - 2 VIEW COMPARISON:  05/07/2022 FINDINGS: Pacemaker/AICD appears unchanged. Heart size upper limits of normal. Pericardial calcification as seen previously. Marked reduction in widespread pulmonary edema. Minimal interstitial edema persists. No consolidation, collapse or measurable effusion. Minimal blunting of the posterior costophrenic angles. IMPRESSION: Marked reduction in widespread pulmonary edema. Minimal interstitial edema persists. Tiny amount of  pleural fluid. Electronically Signed   By: Nelson Chimes M.D.   On: 05/09/2022 08:55     Additional studies/ records that were reviewed today include:    ECHO 01/05/2020 IMPRESSIONS   1. Since the last study on 04/18/2017 remains very low at 20-25% with  apical aneurysm and no thrombus on Definity echocontrast images.   2. Left ventricular ejection fraction, by estimation, is 20 to 25%. The  left ventricle has severely decreased function. The left ventricle  demonstrates global hypokinesis. The left ventricular internal cavity size  was moderately dilated. Left  ventricular diastolic parameters are consistent with Grade I diastolic  dysfunction (impaired relaxation).   3. Right ventricular systolic function is normal. The right ventricular  size is normal. There is normal pulmonary artery systolic pressure. The  estimated right ventricular systolic pressure is 24.5 mmHg.   4. The mitral valve is normal in structure. Mild mitral valve  regurgitation. No evidence of mitral stenosis.   5. The aortic valve is normal in structure. Aortic valve regurgitation is  not visualized. No aortic stenosis is present.   6. The inferior vena cava is normal in size with greater than 50%  respiratory variability, suggesting right atrial pressure of 3 mmHg.    ASSESSMENT:    1. Coronary artery disease of native artery of native heart with stable angina pectoris ([place])   2. Chronic combined systolic and diastolic heart failure ([place])   3. Hyperlipidemia with target LDL less than 70   4. Long term current use of anticoagulant therapy   5. Apical mural thrombus   6. Warfarin anticoagulation   7. Implantable cardioverter-defibrillator (ICD) in situ     PLAN:  1.  CAD: Steven. Busche suffered a large anterior wall myocardial infarction with late presentation in January 2012.  He has been without recurrent anginal symptomatology.  During his most recent hospitalization in November 2023, amlodipine and Imdur were discontinued due to low blood pressure in the hope of increasing Entresto.  He continues to be on carvedilol 6.25 mg twice a day.  2.  Ischemic  cardiomyopathy: He was previously demonstrated to have an EF of 20 to 25%.  Initially had also developed apical thrombus for which he was on warfarin therapy.  His echo Doppler study from July 2021 continue to show EF of 20 to 25%.  His most recent echo Doppler study from May 09, 2022 was reviewed.  EF estimate was less than 20% with severe decreased function in a global hypokinetic pattern.  LV internal cavity was moderately dilated.  There was biatrial enlargement.  The inferior vena cava was dilated in size with less than 50% respiratory variability suggesting right atrial pressure of 15 mmHg.  Presently, he is euvolemic on exam on his current regimen of carvedilol 6.25 twice a day, Entresto 24/26 twice a day furosemide 60 mg daily, Farxiga 10 mg and spironolactone 12.5 mg every other day.  His blood pressure continues to be soft and on repeat by me was 98-100/64.  Will therefore keep current doses the same but in the future hopefully her blood pressure can increase Entresto dose of 80 further titrated.  3: ICD: He underwent a generator change out for his dual-chamber ICD in August 2020.  He is followed by Dr. Sallyanne Kuster and his evaluation in December 2022 remained stable with normal device function.  He has been demonstrated to have occasional short bursts of nonsustained VT which is asymptomatic.  4.  Warfarin anticoagulation.  Previously he was found  to have a moderate size apical mural thrombus.  His most recent echo of May 09, 2022 again showed an inherent mass at the inferoseptal apical portion consistent with laminated/chronic thrombus.  INR was checked today and was 2.3.  5: Hyperlipidemia.  Presently he is now on atorvastatin 80 mg daily.  LDL cholesterol was 64 in April 18, 2022.  6.  History of nonsustained VT documented prior to his generator change.  He is undergoing device checks at 35-monthintervals followed by Dr. CSallyanne Kuster  I have recommended that he be evaluated by HAlmyra Deforest  PA in 6 weeks and see me in 3 months for follow-up evaluation.   Medication Adjustments/Labs and Tests Ordered: Current medicines are reviewed at length with the patient today.  Concerns regarding medicines are outlined above.  Medication changes, Labs and Tests ordered today are listed in the Patient Instructions below. Patient Instructions  Medication Instructions:  Continue current medications  *If you need a refill on your cardiac medications before your next appointment, please call your pharmacy*   Lab Work: None Ordered   Testing/Procedures: None Ordered   Follow-Up: [facility] and your [facility] needs are our priority.  As part of our continuing mission to provide you with exceptional heart care, we have created designated Provider Care Teams.  These Care Teams include your primary Cardiologist (physician) and Advanced Practice Providers (APPs -  Physician Assistants and Nurse Practitioners) who all work together to provide you with the care you need, when you need it.  We recommend signing up for the patient portal called "MyChart".  Sign up information is provided on this After Visit Summary.  MyChart is used to connect with patients for Virtual Visits (Telemedicine).  Patients are able to view lab/test results, encounter notes, upcoming appointments, etc.  Non-urgent messages can be sent to your provider as well.   To learn more about what you can do with MyChart, go to [email or web address]    Your next appointment:   6 week(s)  The format for your next appointment:   In Person  Provider:   HAlmyra Deforest PA-C    Then, TShelva Majestic MD will plan to see you again in 3 month(s).    Other Instructions            Signed, TShelva Majestic MD  06/06/2022 5:59 PM    [facility] [address] [place]  [id number]: [phone]

## 2022-06-06 ENCOUNTER — Encounter: Payer: Self-pay | Admitting: Cardiovascular Disease

## 2022-06-07 ENCOUNTER — Telehealth: Payer: Self-pay | Admitting: Cardiovascular Disease

## 2022-06-07 MED ORDER — FUROSEMIDE 20 MG PO TABS: 60.0000 mg | ORAL_TABLET | Freq: Every day | ORAL | 3 refills | Status: DC

## 2022-06-07 NOTE — Telephone Encounter (Signed)
Called patient, advised that he is suppose to be having surgery, it was cancelled previously- I did recommend we have an updated clearance form requested from [facility] [place] [facility]. He will have them send it over.   Patient also requesting refill, RX sent to pharmacy already, notified patient of this.   Patient verbalized understanding

## 2022-06-07 NOTE — Telephone Encounter (Signed)
Pt would like a callback from nurse regarding a procedure he was scheduled to have. Please advise

## 2022-06-07 NOTE — Telephone Encounter (Signed)
*  STAT* If patient is at the pharmacy, call can be transferred to refill team.   1. Which medications need to be refilled? (please list name of each medication and dose if known)   furosemide (LASIX) 20 MG tablet    2. Which pharmacy/location (including street and city if local pharmacy) is medication to be sent to?  [facility] ([place] [place] - [address]     3. Do they need a 30 day or 90 day supply?  90 day   Pt states that pharmacy is needing updated prescription for medication

## 2022-06-17 ENCOUNTER — Other Ambulatory Visit: Payer: Self-pay | Admitting: Cardiovascular Disease

## 2022-06-17 DIAGNOSIS — I513 Intracardiac thrombosis, not elsewhere classified: Secondary | ICD-10-CM

## 2022-06-21 ENCOUNTER — Other Ambulatory Visit: Payer: Self-pay | Admitting: Cardiovascular Disease

## 2022-06-21 DIAGNOSIS — I513 Intracardiac thrombosis, not elsewhere classified: Secondary | ICD-10-CM

## 2022-06-21 NOTE — Progress Notes (Signed)
Remote ICD transmission.   

## 2022-06-29 ENCOUNTER — Ambulatory Visit: Payer: 59 | Attending: Cardiovascular Disease

## 2022-06-29 DIAGNOSIS — I513 Intracardiac thrombosis, not elsewhere classified: Secondary | ICD-10-CM | POA: Diagnosis not present

## 2022-06-29 DIAGNOSIS — Z7901 Long term (current) use of anticoagulants: Secondary | ICD-10-CM | POA: Diagnosis not present

## 2022-06-29 LAB — POCT INR: INR: 2.7 (ref 2.0–3.0)

## 2022-06-29 NOTE — Patient Instructions (Signed)
Description   Continue taking warfarin 1 tablet daily except for 1/2 tablet on Wednesday and Fridays. Recheck INR in 5 weeks. [facility] [phone]

## 2022-07-08 NOTE — Progress Notes (Unsigned)
Cardiology Clinic Note   Patient Name: Steven [name] Date of Encounter: 07/10/2022  Primary Care Provider:  Macarthur Critchley, MD Primary Cardiologist:  Shelva Majestic, MD  Patient Profile    Steven [name] is a 68 y.o. 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   Past Medical History    Past Medical History:  Diagnosis Date   Acute MI, anterior wall (HCC)    AICD (automatic cardioverter/defibrillator) present    CAD (coronary artery disease)    2D ECHO, 07/13/2011 - EF <25%, LV moderatelty dilated, LA moderately dilatedLEXISCAN, 12/14/2011 - moderate-severe perfusion defect seen in the basal anteroseptal, mid anterior, apicacl anterior, apical, apical inferior, and apical lateral regions, post-stress EF 25%, new EKG changes from baseline abnormalities   Cancer [facility])    Prostate   CHF (congestive heart failure) ([place]) 2012   Hypertension 08/08/2021   Inguinal hernia, left    Pneumonia    November 2023   Pre-diabetes    Past Surgical History:  Procedure Laterality Date   CARDIAC CATHETERIZATION  11/25/2010   Predilation balloon-Apex monorail 2x56m, Cutting balloon-2.25x125m resulting in a reduction of 100% stenosis down to less than 10%   CARDIAC CATHETERIZATION  07/12/2010   LAD stented with a 3.5x2465mare-metal Veriflex stent resulting in a reduction of 100% lesion to 0%   CARDIAC DEFIBRILLATOR PLACEMENT  03/02/2011   Medtronic Protecta XT DR, model #[id number] #PS#[id number]  COLONOSCOPY     GOLD SEED IMPLANT N/A 07/21/2021   Procedure: GOLD SEED IMPLANT;  Surgeon: BorRaynelle BringD;  Location: [facility];  Service: Urology;  Laterality:  N/A;  NEEDS 30 MIN   HERNIA REPAIR  2018   ICD GENERATOR CHANGEOUT N/A 02/24/2019   Procedure: ICD GENERATOR CHANGEOUT;  Surgeon: CroSanda KleinD;  Location: [facility];  Service: Cardiovascular;  Laterality: N/A;   PACEMAKER PLACEMENT  07/14/2010   Temporary placement of pacemaker, if rhythm issue continues will need a permanent device   SPACE OAR INSTILLATION N/A 07/21/2021   Procedure: SPACE OAR INSTILLATION;  Surgeon: BorRaynelle BringD;  Location: [facility];  Service: Urology;  Laterality: N/A;    Allergies  No Known Allergies  History of Present Illness    Steven [name] has a past medical history of: CAD.  LHC 07/13/2010 (STEMI): Proximal LAD 100%, [name] 70-80%, mid RCA 40%, PDA 60-70%. PCI with BMS 3.5 x 24 mm proximal LAD.  48 hours later developed intermittent second degree block that progressed to second and third AV block and long pauses with non-conducting P-waves. Brought to cath lab for temporary pacemaker implantation.  Temporary pacer pulled on 07/16/2010.  LHC 11/25/2010 (NSTEMI/flash pulmonary edema): [name] 100% with right to left collaterals. Patent LAD stent. Stable RCA disease. Stent-like cutting balloon angioplasty to [name].   PAF. Chronic combined diastolic and systolic heart failure/ischemic cardiomyopathy.   Echo 1/191/2012: EF 30-35% with mild LVH and concentric hypertrophy. Akinesis of anteroseptal myocardium. Mild MR. Moderate pulmonary hypertension. ?apical thrombus.  07/20/2010: Placed on LifeVest.  ICD placement 03/02/2011: Medtronic ICD.  02/24/2019: Generator change out.  Remote device check 05/23/2022: Normal device function. Not PM dependent. Battery status good, lead measurements stable.  Echo 05/09/2022:  EF <20%. Global hypokinesis. Moderately dilated left ventricular internal cavity. Mildly elevated pulmonary artery systolic pressure. Mild to moderately dilated left atrium. Mildly dilated right atrium. Trivial MR. RA pressure 15 mmHg. Adherent LV thrombus  at inferoseptal apical segment. Apical mural thrombus.  Echo 07/14/2010: ?apical thrombus.  Echo 05/09/2022: On contrast images there is an adherent mass at the inferoseptal apical portion that is consistent with laminated/chronic thrombus (Not seen on echo contrast images 2021).   NSVT. Hypertension.  Hyperlipidemia.  CKD stage IIIb  Steven [name] was first evaluated by Dr. Claiborne Billings in January 2012 when he was brought emergently to the cath lab for a STEMI. He has been followed since that time for the above outlined history.   He presented to the ED via EMS on 05/07/2022 for increasing shortness of breath and congestion and was admitted for acute on chronic systolic heart failure (echo as above). He was treated with BiPAP therapy and amlodipine and Imdur were discontinued in hopes of future increase of Entresto dose. He was evaluated by Darrick Grinder, NP post hospital admission and continued on carvedilol 6.25 mg bid, Entresto 24/26 mg bid, Farxiga 10 mg daily, lasix 60 mg daily, and spironolactone 12.5 mg every other day.   He was last evaluated in the office by Dr. Claiborne Billings on 05/30/2022. At that time, he was feeling better. BP still soft at that time so all medications were continued without adjustment.   Today, patient is doing well. Denies lower extremity edema, orthopnea, or PND. No palpitations.  He does have mild chest pain and mild shortness of breath with heavy exertion (walking greater than 3 blocks) but this is unchanged for years per patient.  He weighs at home daily with stable weight 164 to 165 pounds.  He lives alone and is independent with all personal hygiene, ADLs, and light to moderate housekeeping.  He hires out for yard work.  He is awaiting scheduling for inguinal hernia repair, which is causing him intermittent discomfort.    Home Medications    Current Meds  Medication Sig   acetaminophen (TYLENOL) 500 MG tablet Take 1,000 mg by mouth every 6 (six) hours as needed for mild pain.    atorvastatin (LIPITOR) 80 MG tablet Take 1 tablet (80 mg total) by mouth daily.   carvedilol (COREG) 6.25 MG tablet Take 1 tablet (6.25 mg total) by mouth 2 (two) times daily with a meal.   Cholecalciferol (VITAMIN D) 125 MCG (5000 UT) CAPS Take 5,000 Units by mouth daily.   dapagliflozin propanediol (FARXIGA) 10 MG TABS tablet Take 10 mg by mouth daily.   furosemide (LASIX) 20 MG tablet Take 3 tablets (60 mg total) by mouth daily.   Multiple Vitamin (MULTIVITAMIN) tablet Take 1 tablet by mouth daily.   nitroGLYCERIN (NITROSTAT) 0.4 MG SL tablet Place 1 tablet (0.4 mg total) under the tongue every 5 (five) minutes as needed. Usual dose is q 5 minutes x 3 doses.   Pancrelipase, Lip-Prot-Amyl, (ZENPEP PO) Take 1-4 capsules by mouth with breakfast, with lunch, and with evening meal.   sacubitril-valsartan (ENTRESTO) 24-26 MG Take 1 tablet by mouth 2 (two) times daily.   spironolactone (ALDACTONE) 25 MG tablet Take 0.5 tablets (12.5 mg total) by mouth every other day. TAKE EOD ON EVEN DAYS   tamsulosin (FLOMAX) 0.4 MG CAPS capsule Take 0.4 mg by mouth at bedtime.   warfarin (COUMADIN) 5 MG tablet TAKE ONE-HALF TO ONE TABLET BY MOUTH ONCE DAILY AS DIRECTED BY THE COUMADIN CLINIC  Family History    No family history on file. He indicated that his mother is alive. He indicated that his father is deceased. He indicated that his sister is alive. He indicated that both of his brothers are alive. He indicated that his daughter is alive.   Social History    Social History   Socioeconomic History   Marital status: Single    Spouse name: Not on file   Number of children: 1   Years of education: Not on file   Highest education level: Bachelor's degree (e.g., BA, AB, BS)  Occupational History   Occupation: works on Social worker  Tobacco Use   Smoking status: Former    Types: Cigarettes, Cigars    Quit date: 2012    Years since quitting: 12.0   Smokeless tobacco: Never  Vaping Use   Vaping Use:  Never used  Substance and Sexual Activity   Alcohol use: Yes    Alcohol/week: 2.0 - 3.0 standard drinks of alcohol    Types: 2 - 3 drink(s) per week   Drug use: No   Sexual activity: Not Currently  Other Topics Concern   Not on file  Social History Narrative   Not on file   Social Determinants of Health   Financial Resource Strain: Low Risk  (05/08/2022)   Overall Financial Resource Strain (CARDIA)    Difficulty of Paying Living Expenses: Not hard at all  Food Insecurity: No Food Insecurity (05/08/2022)   Hunger Vital Sign    Worried About Running Out of Food in the Last Year: Never true    Ran Out of Food in the Last Year: Never true  Transportation Needs: No Transportation Needs (05/08/2022)   PRAPARE - Hydrologist (Medical): No    Lack of Transportation (Non-Medical): No  Physical Activity: Not on file  Stress: Not on file  Social Connections: Not on file  Intimate Partner Violence: Not At Risk (05/08/2022)   Humiliation, Afraid, Rape, and Kick questionnaire    Fear of Current or Ex-Partner: No    Emotionally Abused: No    Physically Abused: No    Sexually Abused: No     Review of Systems    General: No chills, fever, night sweats or weight changes.  Cardiovascular:  No chest pain, dyspnea on exertion, edema, orthopnea, palpitations, paroxysmal nocturnal dyspnea. Dermatological: No rash, lesions/masses Respiratory: No cough, dyspnea Urologic: No hematuria, dysuria Abdominal:   No nausea, vomiting, diarrhea, bright red blood per rectum, melena, or hematemesis. Positive for lower abdominal/groin pain secondary to hernia.  Neurologic:  No visual changes, weakness, changes in mental status. All other systems reviewed and are otherwise negative except as noted above.  Physical Exam    VS:  BP 116/72   Pulse 78   Ht '5\' 9"'$  (1.753 m)   Wt 166 lb (75.3 kg)   BMI 24.51 kg/m  , BMI Body mass index is 24.51 kg/m. GEN:  Well nourished, well  developed, in no acute distress. HEENT: Normal. Neck: Supple, no JVD, carotid bruits, or masses. Cardiac: RRR, no murmurs, rubs, or gallops. No clubbing, cyanosis, edema.  Radials/DP/PT 2+ and equal bilaterally.  Respiratory:  Respirations regular and unlabored, clear to auscultation bilaterally. GI: Soft, nontender, nondistended. MS: No deformity or atrophy. Skin: Warm and dry, no rash. Neuro: Strength and sensation are intact. Psych: Normal affect.  Accessory Clinical Findings    Recent Labs: 04/18/2022: NT-Pro BNP 587 05/07/2022: ALT 24; B Natriuretic Peptide 1,303.7; Hemoglobin 13.7;  Platelets 221 05/09/2022: Magnesium 2.2 05/15/2022: BUN 19; Creatinine, Ser 1.30; Potassium 4.4; Sodium 138   Recent Lipid Panel    Component Value Date/Time   CHOL 121 04/18/2022 0958   CHOL 161 07/01/2013 0838   TRIG 50 04/18/2022 0958   TRIG 108 07/01/2013 0838   HDL 45 04/18/2022 0958   HDL 43 07/01/2013 0838   CHOLHDL 2.7 04/18/2022 0958   CHOLHDL 3.3 09/03/2014 0803   VLDL 16 09/03/2014 0803   LDLCALC 64 04/18/2022 0958   LDLCALC 96 07/01/2013 0838    ECG personally reviewed by me today: A-paced with prolonged AV conduction PACs and PVCs.  No significant changes from 04/14/2022.     Assessment & Plan   Chronic combined diastolic and systolic heart failure/ischemic cardiomyopathy. S/p ICD placement 2012 with gen change 2020.  Patient with hospital admission in November 2023. Echo with EF <20 and biatrial enlargement. Patient denies lower extremity edema or abdominal bloating/fullness.  Euvolemic and well compensated on exam.  Breath sounds clear to auscultation.  Continue carvedilol, Farxiga, lasix, Entresto, and spironolactone.  CAD. Steven [name]. Patient reports mild chest pain and shortness of breath with heavy exertion (walking greater than 3 blocks) that is unchanged for years.  Continue coumadin,  Lipitor, carvedilol, and prn SL NTG. Patient not on aspirin therapy secondary to coumadin.  Apical mural thrombus. Echo November 2023 showed adherent mass at the inferoseptal apical portion consistent with laminated/chronic thrombus. Followed by coumadin clinic. Continue coumadin.  Hypertension. BP today 116/72.  Patient states his BP is always on the low side.  Patient denies headaches or dizziness. Continue carvedilol and Entresto.  Preoperative cardiovascular risk assessment. Patient is awaiting scheduling of hernia surgery. This was canceled previously secondary to hospitalization.  History of ischemic heart disease, PCI, and heart failure. According to the RCRI, patient has a 6.6% risk of MACE. Patient reports activity equivalent to 5.07 METS (personal hygiene, ADLs, light to moderate housekeeping, walking 1-2 blocks, walking stairs). Based on ACC/AHA guidelines, Steven [name] would be at acceptable risk for the planned procedure without further cardiovascular testing.  Discussed with DOD, Dr. Martinique, who is in agreement.  Patient was advised that if he develops new symptoms prior to surgery to contact our office to arrange a follow-up appointment.  He verbalized understanding.  Per Pharm D: Procedure: hernia surgery  Date of procedure: TBD     CHA2DS2-VASc Score = 4   This indicates a 4.8% annual risk of stroke. 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 Has had previous warfarin holds without bridging    Per office protocol, patient can hold warfarin  for 5 days prior to procedure.   Patient will not need bridging with Lovenox (enoxaparin) around procedure.     Disposition: Return in 3 months  or sooner as needed.    Justice Britain. Latifa Noble, DNP, NP-C     07/10/2022, 8:54 AM [place] [address] Office [phone] Fax [phone]

## 2022-07-10 ENCOUNTER — Ambulatory Visit: Payer: 59 | Attending: Physician Assistant | Admitting: Student

## 2022-07-10 ENCOUNTER — Encounter: Payer: Self-pay | Admitting: Physician Assistant

## 2022-07-10 ENCOUNTER — Ambulatory Visit: Payer: 59 | Admitting: Physician Assistant

## 2022-07-10 VITALS — BP 116/72 | HR 78 | Ht 69.0 in | Wt 166.0 lb

## 2022-07-10 DIAGNOSIS — I25118 Atherosclerotic heart disease of native coronary artery with other forms of angina pectoris: Secondary | ICD-10-CM | POA: Diagnosis not present

## 2022-07-10 DIAGNOSIS — I513 Intracardiac thrombosis, not elsewhere classified: Secondary | ICD-10-CM | POA: Diagnosis not present

## 2022-07-10 DIAGNOSIS — Z01810 Encounter for preprocedural cardiovascular examination: Secondary | ICD-10-CM | POA: Diagnosis not present

## 2022-07-10 DIAGNOSIS — I255 Ischemic cardiomyopathy: Secondary | ICD-10-CM | POA: Diagnosis not present

## 2022-07-10 DIAGNOSIS — I5042 Chronic combined systolic (congestive) and diastolic (congestive) heart failure: Secondary | ICD-10-CM

## 2022-07-10 DIAGNOSIS — I10 Essential (primary) hypertension: Secondary | ICD-10-CM

## 2022-07-10 NOTE — Patient Instructions (Signed)
Medication Instructions:  Your physician recommends that you continue on your current medications as directed. Please refer to the Current Medication list given to you today.  *If you need a refill on your cardiac medications before your next appointment, please call your pharmacy*  Lab Work: NONE ordered at this time of appointment   If you have labs (blood work) drawn today and your tests are completely normal, you will receive your results only by: [place] (if you have MyChart) OR A paper copy in the mail If you have any lab test that is abnormal or we need to change your treatment, we will call you to review the results.  Testing/Procedures: NONE ordered at this time of appointment   Follow-Up: At [facility], you and your health needs are our priority.  As part of our continuing mission to provide you with exceptional heart care, we have created designated Provider Care Teams.  These Care Teams include your primary Cardiologist (physician) and Advanced Practice Providers (APPs -  Physician Assistants and Nurse Practitioners) who all work together to provide you with the care you need, when you need it.   Your next appointment:   3 month(s)  Provider:   Shelva Majestic, MD  or APP    Other Instructions You are cleared from a cardiac standpoint for your upcoming hernia surgery. The surgeon's office will be in contact with further instructions

## 2022-08-03 ENCOUNTER — Ambulatory Visit: Payer: 59 | Attending: Cardiology | Admitting: *Deleted

## 2022-08-03 DIAGNOSIS — Z7901 Long term (current) use of anticoagulants: Secondary | ICD-10-CM

## 2022-08-03 DIAGNOSIS — I513 Intracardiac thrombosis, not elsewhere classified: Secondary | ICD-10-CM

## 2022-08-03 LAB — POCT INR: INR: 3.9 — AB (ref 2.0–3.0)

## 2022-08-03 NOTE — Patient Instructions (Addendum)
Description   Do not take any warfarin today then continue taking warfarin 1 tablet daily except for 1/2 tablet on Wednesday and Fridays. Recheck INR in 1 week post surgery. Coumadin Clinic [phone]      Last dose of warfarin on 08/17/22 then start holding for upcoming procedure on 08/23/22 When you resume take an extra 1/2 tablet for 2 days then resume normal dose. If you have any questions call us at [phone]

## 2022-08-14 ENCOUNTER — Telehealth: Payer: Self-pay | Admitting: Cardiovascular Disease

## 2022-08-14 ENCOUNTER — Encounter: Payer: Self-pay | Admitting: Cardiovascular Disease

## 2022-08-14 DIAGNOSIS — I513 Intracardiac thrombosis, not elsewhere classified: Secondary | ICD-10-CM

## 2022-08-14 MED ORDER — WARFARIN SODIUM 5 MG PO TABS: ORAL_TABLET | ORAL | 1 refills | Status: DC

## 2022-08-14 NOTE — Telephone Encounter (Signed)
Pt c/o medication issue:  1. Name of Medication: warfarin (COUMADIN) 5 MG tablet   2. How are you currently taking this medication (dosage and times per day)?   3. Are you having a reaction (difficulty breathing--STAT)?   4. What is your medication issue? Patient called stating he take 1/2 tablet twice a week on Monday and Friday, then he takes a whole tablet the rest of the week.  Patient stated the pharmacy won't refill it for him because they have the script as him only taking 1/2 a tablet, so they are telling him it is too soon to fill.  Please sent script to [facility] ([facility] [place].

## 2022-08-14 NOTE — Telephone Encounter (Signed)
Refill sent to requested pharmacy.

## 2022-08-14 NOTE — Telephone Encounter (Signed)
error 

## 2022-08-15 ENCOUNTER — Ambulatory Visit: Payer: Self-pay | Admitting: Surgery

## 2022-08-15 NOTE — Progress Notes (Signed)
Surgical Instructions    Your procedure is scheduled on Wednesday, August 23, 2022.  Report to [facility] Main Entrance "A" at 8:00 A.M., then check in with the Admitting office.  Call this number if you have problems the morning of surgery:  [phone]   If you have any questions prior to your surgery date call [phone]: Open Monday-Friday 8am-4pm If you experience any cold or flu symptoms such as cough, fever, chills, shortness of breath, etc. between now and your scheduled surgery, please notify us at the above number     Remember:  Do not eat after midnight the night before your surgery  You may drink clear liquids until 7:00 the morning of your surgery.   Clear liquids allowed are: Water, Non-Citrus Juices (without pulp), Carbonated Beverages, Clear Tea, Black Coffee ONLY (NO MILK, CREAM OR POWDERED CREAMER of any kind), and Gatorade    Take these medicines the morning of surgery with A SIP OF WATER:  atorvastatin (LIPITOR)  carvedilol (COREG)  sacubitril-valsartan (ENTRESTO)   If Needed:  acetaminophen (TYLENOL)  nitroGLYCERIN (NITROSTAT)   Stop taking FARXIGA 3 days prior to surgery. Last dose on 08/19/2022.  Per Mayra Reel, NP, Hold Warfarin 5 days prior to Surgery. Last dose on 08/17/2022.  As of today, STOP taking any Aspirin (unless otherwise instructed by your surgeon) Aleve, Naproxen, Ibuprofen, Motrin, Advil, Goody's, BC's, all herbal medications, fish oil, and all vitamins.   SURGICAL WAITING ROOM VISITATION Patients having surgery or a procedure may have no more than 2 support people in the waiting area - these visitors may rotate.   Children under the age of 48 must have an adult with them who is not the patient. If the patient needs to stay at the hospital during part of their recovery, the visitor guidelines for inpatient rooms apply. Pre-op nurse will coordinate an appropriate time for 1 support person to accompany patient in pre-op.  This  support person may not rotate.   Please refer to [email or web address].hu for the visitor guidelines for Inpatients (after your surgery is over and you are in a regular room).    Special instructions:    Oral Hygiene is also important to reduce your risk of infection.  Remember - BRUSH YOUR TEETH THE MORNING OF SURGERY WITH YOUR REGULAR TOOTHPASTE   [place]- Preparing For Surgery  Before surgery, you can play an important role. Because skin is not sterile, your skin needs to be as free of germs as possible. You can reduce the number of germs on your skin by washing with CHG (chlorahexidine gluconate) Soap before surgery.  CHG is an antiseptic cleaner which kills germs and bonds with the skin to continue killing germs even after washing.     Please do not use if you have an allergy to CHG or antibacterial soaps. If your skin becomes reddened/irritated stop using the CHG.  Do not shave (including legs and underarms) for at least 48 hours prior to first CHG shower. It is OK to shave your face.  Please follow these instructions carefully.     Shower the NIGHT BEFORE SURGERY and the MORNING OF SURGERY with CHG Soap.   If you chose to wash your hair, wash your hair first as usual with your normal shampoo. After you shampoo, rinse your hair and body thoroughly to remove the shampoo.  Then [employer] and genitals (private parts) with your normal soap and rinse thoroughly to remove soap.  After that Use CHG Soap as you would  any other liquid soap. You can apply CHG directly to the skin and wash gently with a scrungie or a clean washcloth.   Apply the CHG Soap to your body ONLY FROM THE NECK DOWN.  Do not use on open wounds or open sores. Avoid contact with your eyes, ears, mouth and genitals (private parts). Wash Face and genitals (private parts)  with your normal soap.   Wash thoroughly, paying special attention to the area where your surgery will  be performed.  Thoroughly rinse your body with warm water from the neck down.  DO NOT shower/wash with your normal soap after using and rinsing off the CHG Soap.  Pat yourself dry with a CLEAN TOWEL.  Wear CLEAN PAJAMAS to bed the night before surgery  Place CLEAN SHEETS on your bed the night before your surgery  DO NOT SLEEP WITH PETS.   Day of Surgery:  Take a shower with CHG soap. Wear Clean/Comfortable clothing the morning of surgery Do not apply any deodorants/lotions.   Remember to brush your teeth WITH YOUR REGULAR TOOTHPASTE.      Do not wear jewelry or makeup. Do not wear lotions, powders, perfumes/cologne or deodorant. Do not shave 48 hours prior to surgery.  Men may shave face and neck. Do not bring valuables to the hospital. Do not wear nail polish, gel polish, artificial nails, or any other type of covering on natural nails (fingers and toes) If you have artificial nails or gel coating that need to be removed by a nail salon, please have this removed prior to surgery. Artificial nails or gel coating may interfere with anesthesia's ability to adequately monitor your vital signs.  [facility] is not responsible for any belongings or valuables.    Do NOT Smoke (Tobacco/Vaping)  24 hours prior to your procedure  If you use a CPAP at night, you may bring your mask for your overnight stay.   Contacts, glasses, hearing aids, dentures or partials may not be worn into surgery, please bring cases for these belongings   For patients admitted to the hospital, discharge time will be determined by your treatment team.   Patients discharged the day of surgery will not be allowed to drive home, and someone needs to stay with them for 24 hours.   If you received a COVID test during your pre-op visit, it is requested that you wear a mask when out in public, stay away from anyone that may not be feeling well, and notify your surgeon if you develop symptoms. If you have been in  contact with anyone that has tested positive in the last 10 days, please notify your surgeon.    Please read over the following fact sheets that you were given.

## 2022-08-16 ENCOUNTER — Inpatient Hospital Stay (HOSPITAL_COMMUNITY)
Admission: RE | Admit: 2022-08-16 | Discharge: 2022-08-16 | Disposition: A | Discharge status: Home / Self Care | Payer: 59 | Source: Ambulatory Visit

## 2022-08-22 ENCOUNTER — Ambulatory Visit: Payer: 59

## 2022-08-22 DIAGNOSIS — I255 Ischemic cardiomyopathy: Secondary | ICD-10-CM | POA: Diagnosis not present

## 2022-08-23 ENCOUNTER — Ambulatory Visit: Admit: 2022-08-23 | Payer: 59 | Admitting: Surgery

## 2022-08-23 LAB — CUP PACEART REMOTE DEVICE CHECK
Battery Remaining Longevity: 81 mo
Battery Voltage: 3 V
Brady Statistic AP VP Percent: 0.02 %
Brady Statistic AP VS Percent: 52.93 %
Brady Statistic AS VP Percent: 0.02 %
Brady Statistic AS VS Percent: 47.03 %
Brady Statistic RA Percent Paced: 52.52 %
Brady Statistic RV Percent Paced: 0.04 %
Date Time Interrogation Session: 20240227193823
HighPow Impedance: 48 Ohm
HighPow Impedance: 71 Ohm
Implantable Lead Connection Status: 753985
Implantable Lead Connection Status: 753985
Implantable Lead Implant Date: 20120906
Implantable Lead Implant Date: 20120906
Implantable Lead Location: 753859
Implantable Lead Location: 753860
Implantable Lead Model: 185
Implantable Lead Model: 5076
Implantable Lead Serial Number: 358872
Implantable Pulse Generator Implant Date: 20200831
Lead Channel Impedance Value: 342 Ohm
Lead Channel Impedance Value: 342 Ohm
Lead Channel Impedance Value: 380 Ohm
Lead Channel Pacing Threshold Amplitude: 0.5 V
Lead Channel Pacing Threshold Amplitude: 0.875 V
Lead Channel Pacing Threshold Pulse Width: 0.4 ms
Lead Channel Pacing Threshold Pulse Width: 0.4 ms
Lead Channel Sensing Intrinsic Amplitude: 1.75 mV
Lead Channel Sensing Intrinsic Amplitude: 1.75 mV
Lead Channel Sensing Intrinsic Amplitude: 4.25 mV
Lead Channel Sensing Intrinsic Amplitude: 4.25 mV
Lead Channel Setting Pacing Amplitude: 1.5 V
Lead Channel Setting Pacing Amplitude: 2.5 V
Lead Channel Setting Pacing Pulse Width: 0.4 ms
Lead Channel Setting Sensing Sensitivity: 0.3 mV
Zone Setting Status: 755011
Zone Setting Status: 755011

## 2022-08-23 SURGERY — REPAIR, HERNIA, INGUINAL, ADULT
Anesthesia: General | Laterality: Right

## 2022-08-31 ENCOUNTER — Ambulatory Visit: Payer: 59

## 2022-09-01 ENCOUNTER — Ambulatory Visit: Payer: 59 | Attending: Cardiovascular Disease

## 2022-09-01 DIAGNOSIS — I513 Intracardiac thrombosis, not elsewhere classified: Secondary | ICD-10-CM

## 2022-09-01 DIAGNOSIS — Z7901 Long term (current) use of anticoagulants: Secondary | ICD-10-CM | POA: Diagnosis not present

## 2022-09-01 LAB — POCT INR: INR: 4.7 — AB (ref 2.0–3.0)

## 2022-09-01 NOTE — Patient Instructions (Addendum)
Description   Skip Warfarin x 2 dosages,  then start taking warfarin 1 tablet daily except for 1/2 tablet on Mondays, Wednesdays and Fridays. Recheck INR in 1 week.  Coumadin Clinic [phone]

## 2022-09-08 ENCOUNTER — Ambulatory Visit: Payer: 59 | Attending: Cardiovascular Disease | Admitting: *Deleted

## 2022-09-08 DIAGNOSIS — I513 Intracardiac thrombosis, not elsewhere classified: Secondary | ICD-10-CM | POA: Diagnosis not present

## 2022-09-08 DIAGNOSIS — Z7901 Long term (current) use of anticoagulants: Secondary | ICD-10-CM

## 2022-09-08 LAB — POCT INR: POC INR: 2.1

## 2022-09-08 NOTE — Patient Instructions (Signed)
Description   Continue taking warfarin 1 tablet daily except for 1/2 tablet on Mondays, Wednesdays and Fridays. Recheck INR in 2 week.  Coumadin Clinic [phone]

## 2022-09-11 ENCOUNTER — Telehealth: Payer: Self-pay | Admitting: Cardiovascular Disease

## 2022-09-11 NOTE — Telephone Encounter (Signed)
Pt c/o Shortness Of Breath: STAT if SOB developed within the last 24 hours or pt is noticeably SOB on the phone  1. Are you currently SOB (can you hear that pt is SOB on the phone)? no  2. How long have you been experiencing SOB? A week  3. Are you SOB when sitting or when up moving around? Both   4. Are you currently experiencing any other symptoms? no

## 2022-09-11 NOTE — Telephone Encounter (Signed)
Returned call to patient-patient reports increased SOB ~ 1 week.   Reports some SOB with exertion but mainly SOB when lying flat.  Reports very mild LE edema, no abdominal distention.  Reports weighing daily and weight has been consistent at 166 lbs.   He takes lasix 60 mg daily.   No chest pain or palpitations.   No SOB or distress noted on phone.   He does report eating fast food last week (fries and nuggets).     Offered appt 3/20, patient unable to come.     Advised to monitor/limit sodium intake and monitor weights.   Will send message to Dr. Claiborne Billings to review.  Patient aware.

## 2022-09-12 NOTE — Telephone Encounter (Signed)
Agree with recommendations of sodium restriction.  If he has continued shortness of breath can consider increasing furosemide to 80 mg for 2 to 3 days to see if this improves his breathing and potential swelling

## 2022-09-13 NOTE — Telephone Encounter (Signed)
Left message to call back  

## 2022-09-14 NOTE — Telephone Encounter (Signed)
Pt is returning call.  

## 2022-09-14 NOTE — Telephone Encounter (Signed)
Called patient with recommendations of provider  Agree with recommendations of sodium restriction.  If he has continued shortness of breath can consider increasing furosemide to 80 mg for 2 to 3 days to see if this improves his breathing and potential swelling    Patient states SOB and swelling have both improved.  His weight is now at 165lbs this morning.  He will continue to watch sodium intake.  If swelling and SOB start again, hew will do the recommendations from provider.  He will call if any further issues prior to his next appt.  He also mentions new job and standing on his feet a lot more.  Encouraged to elevate legs if has a break during the day, and then at night once he is home.

## 2022-09-22 ENCOUNTER — Ambulatory Visit: Payer: 59 | Attending: Cardiovascular Disease | Admitting: *Deleted

## 2022-09-22 DIAGNOSIS — I513 Intracardiac thrombosis, not elsewhere classified: Secondary | ICD-10-CM | POA: Diagnosis not present

## 2022-09-22 DIAGNOSIS — Z7901 Long term (current) use of anticoagulants: Secondary | ICD-10-CM | POA: Diagnosis not present

## 2022-09-22 LAB — POCT INR: POC INR: 2.2

## 2022-09-22 NOTE — Patient Instructions (Addendum)
Description   Continue taking warfarin 1 tablet daily except for 1/2 tablet on Mondays, Wednesdays and Fridays. Recheck INR in 3 weeks.  [facility] [phone]

## 2022-09-27 NOTE — Progress Notes (Signed)
Remote ICD transmission.   

## 2022-10-12 ENCOUNTER — Ambulatory Visit: Payer: 59 | Admitting: Cardiovascular Disease

## 2022-10-12 ENCOUNTER — Encounter: Payer: Self-pay | Admitting: Cardiovascular Disease

## 2022-10-12 ENCOUNTER — Ambulatory Visit (INDEPENDENT_AMBULATORY_CARE_PROVIDER_SITE_OTHER): Payer: 59 | Admitting: *Deleted

## 2022-10-12 ENCOUNTER — Ambulatory Visit: Payer: 59 | Attending: Cardiovascular Disease | Admitting: Cardiovascular Disease

## 2022-10-12 VITALS — BP 126/76 | HR 62 | Ht 69.0 in | Wt 174.0 lb

## 2022-10-12 DIAGNOSIS — I255 Ischemic cardiomyopathy: Secondary | ICD-10-CM | POA: Diagnosis not present

## 2022-10-12 DIAGNOSIS — I5042 Chronic combined systolic (congestive) and diastolic (congestive) heart failure: Secondary | ICD-10-CM

## 2022-10-12 DIAGNOSIS — I25118 Atherosclerotic heart disease of native coronary artery with other forms of angina pectoris: Secondary | ICD-10-CM | POA: Diagnosis not present

## 2022-10-12 DIAGNOSIS — E785 Hyperlipidemia, unspecified: Secondary | ICD-10-CM

## 2022-10-12 DIAGNOSIS — I513 Intracardiac thrombosis, not elsewhere classified: Secondary | ICD-10-CM | POA: Diagnosis not present

## 2022-10-12 DIAGNOSIS — Z7901 Long term (current) use of anticoagulants: Secondary | ICD-10-CM

## 2022-10-12 DIAGNOSIS — Z95810 Presence of automatic (implantable) cardiac defibrillator: Secondary | ICD-10-CM

## 2022-10-12 DIAGNOSIS — R600 Localized edema: Secondary | ICD-10-CM

## 2022-10-12 LAB — LAB REPORT - SCANNED
A1c: 6.9
EGFR: 51

## 2022-10-12 LAB — POCT INR: POC INR: 2.5

## 2022-10-12 MED ORDER — FUROSEMIDE 20 MG PO TABS: 60.0000 mg | ORAL_TABLET | Freq: Every day | ORAL | 3 refills | Status: DC

## 2022-10-12 NOTE — Patient Instructions (Signed)
Medication Instructions:  Your physician has recommended you make the following change in your medication:  CHANGE: Lasix 60 mg in the morning. Take an extra 20 mg in the evening if swelling (as needed).   *If you need a refill on your cardiac medications before your next appointment, please call your pharmacy*   Lab Work: None    Testing/Procedures: None   Follow-Up: At [facility], you and your health needs are our priority.  As part of our continuing mission to provide you with exceptional heart care, we have created designated Provider Care Teams.  These Care Teams include your primary Cardiologist (physician) and Advanced Practice Providers (APPs -  Physician Assistants and Nurse Practitioners) who all work together to provide you with the care you need, when you need it.  We recommend signing up for the patient portal called "MyChart".  Sign up information is provided on this After Visit Summary.  MyChart is used to connect with patients for Virtual Visits (Telemedicine).  Patients are able to view lab/test results, encounter notes, upcoming appointments, etc.  Non-urgent messages can be sent to your provider as well.   To learn more about what you can do with MyChart, go to [email or web address].    Your next appointment:   6 month(s)  Provider:   Nicki Guadalajara, MD     Other Instructions PLEASE PURCHASE AND WEAR COMPRESSION STOCKINGS DAILY AND TAKE OFF AT BEDTIME.  I recommend getting support socks/stockings. 20-30 mmHg is the preferred amount of compression.    Compression stockings are elastic socks that squeeze the legs. They help to increase blood flow to the legs and to decrease swelling in the legs from fluid retention, and reduce the chance of developing blood clots in the lower legs. Please put on in the AM when dressing and off at night when dressing for bed.   [employer]  [address] ([phone]); [place], [place] [address];  [phone]  EMAIL:   [email or web address].   PLEASE MAKE SURE TO ELEVATE YOUR FEET & LEGS ABOVE YOUR HEART WHILE SITTING, THIS WILL HELP WITH THE SWELLING ALSO.

## 2022-10-12 NOTE — Progress Notes (Signed)
Cardiology Office Note    Date:  10/22/2022   ID:  Steven, [name] 1955/10/18, MRN [id number]  PCP:  [name], MD  Cardiologist:  Nicki Guadalajara, MD   5 month follow-up evaluation   History of Present Illness:  Steven [name] is a 67 y.o. male with documented severe ischemic cardiomyopathy following an extensive anterior myocardial infarction with subsequent defibrillator for primary prevention.    He presents for a 5 month follow-up cardiology evaluation.  Steven [name] is originally from [place] and suffered a large out of hospital anterior wall myocardial infarction in January 2012. [name]  He presented after a three-hour delay to the hospital and was taken emergently to the cardiac catheterization laboratory where I performed successful emergent intervention with the door to balloon time of only 25 minutes. His LAD was successfully recanalized. CPK increased to 8012 with an MB of 716 and troponin was greater than 100 initially. He subsequently wore a life vest and ultimately after several months without significant improvement of LV function he underwent implantable cardio defibrillator insertion.  Interrogation of his defibrillator in the past had shown episodes of nonsustained VT and his dose of carvedilol has been further titrated.   A followup echo Doppler study on 08/04/2013 showed an ejection fraction of 20-25% with diffuse hypokinesis but there was akinesis of the entire anteroseptal myocardium and apical region. There now appear to be an apparent medium-sized mural apical thrombus which was not noted on his last echo. He denies paresthesias. He denies episodes of chest pain. He denies shortness of breath.At his last office visit in February in light of his moderate sized mural thrombus, Plavix, was discontinued, and he was started on Coumadin therapy.   In August 2015 he tripped and hit his head on a bar recently developed severe headache, transient leg weakness, which evolves into  slurred speech.  He underwent evaluation 1-1/2 weeks later and was found  to have a subdural hematoma.  He underwent a right burr hole subdural hematoma evacuation on 02/05/2014 by Dr. Flo Shanks at [facility].  Initially aspirin and Coumadin were on hold.  He had a follow-up CT scan and ultimately was given clearance to reinstitute Coumadin anticoagulation in light of his severe cardiomyopathy with an ejection fraction of 15-20% on echo on 03/13/2014 and medium-sized apical mural thrombus which had been documented on a previous echo.  This ultimately has stabilized and he has been back on warfarin anticoagulation.     When I evaluated him in 2016 he denied any chest pain, shortness of breath or recurrent palpitations.  His ICD was  last interrogated one month ago by Dr. Salena Saner.  He was noted to have very short nonsustained bursts of nonsustained VT with one episode lasting 8 beats at 212 bpm on 07/09/2014.  His device was adjusted so as it would intervene more quickly and avoid syncope.     I saw him in September 2018 and prior to that evaluation had not seen him since 2016.  Over this time period, he has been without recurrent chest pain.  At times he has had some occasional dizziness and weakness.  He saw Dr. Royann Shivers on August 09 2016.  His device was interrogated and showed 72% atrial pacing and no ventricular pacing.  There was one 8 beat episode of nonsustained VT since his last device check.  His last echo Doppler study was in September 2015.  He was evaluated by Dr.  Eskridge for insertion of a penile implantation.  Preoperative clearance was given by Dr. Royann Shivers was advised that he hold his warfarin and he will stop his aspirin permanently.  He was  seen at [facility] and was felt to have stable stage III chronic kidney disease.  His blood pressure was controlled and he was euvolemic.    He underwent an echo Doppler study on April 18, 2017.  This continued to show reduced  ejection fraction at 20-25%.  He had significant wall motion abnormality with akinesis of the apical anterior septum, inferolateral wall, and the entire apex.  There was grade 1 diastolic dysfunction.  He was seen by Dr. Royann Shivers in February 2019.  At that time, his ICD had normal device function.  He has been undergoing remote downloads every 3 months via [facility].  He has not experienced any episodes of recent angina.  He was found to have rare episodes of nonsustained VT only 3 in the last 12 months.  At times he notes some mild dizziness if he stands up very fast.   I saw him in April 2019.  Since that time, he has had evaluations with Dr. Royann Shivers and was not device dependent but had 80% atrial pacing.  His device had recorded lengthy episodes of NSVT but had not delivered therapies.  On February 24, 2019 he underwent dual-chamber ICD generator change out due to the device reaching ERI.  He saw Dr. Royann Shivers in follow-up in November 2020.  He has had issues with mild exertionally precipitated chest tightness which has been a chronic problem for some time and over several follow-up visits with Dr. Royann Shivers he was started on isosorbide and ultimately titrated to 120 mg daily.   I had not seen him since 2019 but evaluated him in a telemedicine visit on December 04, 2019.  At that time he continued to have chronic class II stable anginal symptomatology precipitated by increasing activity.    During that evaluation I recommend the addition of low-dose amlodipine 2.5 mg and he feels this improved his mild chronic anginal symptomatology.   He is unaware of any recurrent palpitations or defibrillator discharge.  He has continued to be on warfarin anticoagulation.    I saw him in October 2021 and since his prior evaluation he had undergone a prostate biopsy for which warfarin was held.  He is followed by Dr. [name].  He underwent a repeat echo Doppler study on January 05, 2020 which continue to show very low EF at 20 to  25% with global hypokinesis.  Estimated RV pressure was normal at 23 mm.  He continued to experience some exertional dyspnea and denied  any chest pain.  During my evaluation, I recommended transition to [facility] and he was instructed to discontinue lisinopril for a minimum of 36 hours prior to initiation.  He has been on Farxiga both for his borderline diabetes mellitus as well as CHF.  He was evaluated by Dr. Royann Shivers on May 17, 2020 and had functional class I-II was euvolemic clinically and by OptiVol measurements.  Historicallyhe has had approximately 1 episode of nonsustained VT every month but had only 4 events in the last 11 months.  When I saw him on July 21, 2020 he denied any chest pain but had  noticed some mild dizziness.  He was on amlodipine 2.5 mg daily, carvedilol 25 mg twice a day, Farxiga 10 mg, Entresto 24/26 mg twice a day, furosemide 40 mg, isosorbide 120 mg, spironolactone 12.5 mg.  He denied any leg swelling or increasing shortness of breath.  During that evaluation his blood pressure was low and he was euvolemic.  I suggested he reduce his furosemide to 20 mg and reduce spironolactone from 12.5 mg twice a day down to 12.5 mg daily.  He was evaluated by Nile Riggs in the office on September 01, 2020.  At that time, his blood pressure remained soft precluding further titration of Entresto.  I saw him on December 28, 2020 at which time he continued to feel well and denied any recurrent anginal symptoms or dizziness.  He was unaware of any palpitations.  He continued to be on amlodipine 2.5 mg daily, carvedilol 25 mg twice a day, Farxiga 10 mg daily, Entresto 24/26 mg twice a day, furosemide 20 mg daily, isosorbide 120 mg daily, and spironolactone 12.5 mg daily.  He was on warfarin for anticoagulation followed in the Coumadin clinic.  He was taking Stiolto Respimat and as needed albuterol.   He was evaluated by Dr. Royann Shivers on date May 30, 2021 and remained stable.  Clinically  he was euvolemic.  He continued to be on 3 antianginal medications including amlodipine carvedilol and isosorbide.  He had normal device function and had rare nonsustained VT episodes.  I saw him on April 14, 2022.  At that time he felt well and denied any significant shortness of breath or awareness of palpitations.  At times he has experienced a vague chest sensation with walking.  He continues to be on amlodipine 2.5 mg, carvedilol 25 mg twice a day, furosemide 20 mg daily, isosorbide 120 mg in the morning, in addition to Entresto 24/26 mg twice a day.  He is on warfarin.  He has been taking spironolactone 25 mg daily.  During that evaluation, his blood pressure was low and he was euvolemic.  Blood pressure decreased to 90/60 standing.  I recommended slight reduction of spironolactone down to 12.5 mg and take every other day with continuation of his daily Lasix.  He was hospitalized from November 12 to May 10, 2022 with acute on chronic systolic heart failure he underwent follow-up echo Doppler study which showed an adherent mass at the inferoseptal apical portion consistent with laminated/chronic thrombus without multiple aspects.  EF was less than 20%.  There was mildly elevated pulmonary artery systolic pressure and biatrial enlargement.  Upon presentation he was treated with BiPAP therapy for worsening dyspnea.  During his hospitalization amlodipine and Imdur were discontinued in hopes of future increase in [place] dosage.    Subsequent to his hospitalization he was seen by Tonye Becket in heart failure clinic on May 22, 2022.[name]  His volume status was stable and he continues to be on Lasix 60 mg daily.  Impedance was up on OptiVol.  He was advised to continue his 6.25 twice daily regimen of carvedilol, 24/26 mg twice a day dose of Entresto and continue the spironolactone at the 12.5 every other day dose.  He was on Farxiga 10 mg daily.  He continues to be on warfarin with his minimal  thrombus.  I last saw him on May 30, 2022 at which time he felt well and denied any lightheadedness.  He was unaware of palpitations.  His medical regimen is as noted from his heart failure clinic note of May 22, 2022.    Since I last saw him, he was evaluated by Whitney Muse born on July 10, 2022 for preoperative cardiac risk assessment prior to undergoing inguinal hernia repair which was  causing him intermittent discomfort.  He was given clearance.  His surgery was tentatively scheduled for August 23, 2022 but apparently this was canceled.  Presently, Steven. Patlan has noticed leg swelling.  He admits that he is on his feet a lot at work.  He has continued to be on carvedilol 6.25 mg twice a day, Farxiga 10 mg daily, Entresto 24/26 mg twice a day, furosemide 60 mg in the morning, spironolactone 12.5 mg every other day in addition to warfarin for anticoagulation.  He is on atorvastatin 80 mg for hyperlipidemia.  He denies recent chest pain.  He sees Dr. Dian Situ for primary care and had laboratory done yesterday.  Hemoglobin/hematocrit was stable at 13.5/40.4.  Creatinine was 1.50 consistent with stage IIIa CKD.  Glucose 111.  AST was minimally increased at 42 with normal ALT.fasting lipid studies showed total cholesterol 101, triglycerides 61, HDL 35, LDL 52.  Hemoglobin A1c was 6.9.  He presents for evaluation.   Past Medical History:  Diagnosis Date   Acute MI, anterior wall (HCC)    AICD (automatic cardioverter/defibrillator) present    CAD (coronary artery disease)    2D ECHO, 07/13/2011 - EF <25%, LV moderatelty dilated, LA moderately dilatedLEXISCAN, 12/14/2011 - moderate-severe perfusion defect seen in the basal anteroseptal, mid anterior, apicacl anterior, apical, apical inferior, and apical lateral regions, post-stress EF 25%, new EKG changes from baseline abnormalities   Cancer [facility])    Prostate   CHF (congestive heart failure) (HCC) 2012   Hypertension 08/08/2021   Inguinal  hernia, left    Pneumonia    November 2023   Pre-diabetes     Past Surgical History:  Procedure Laterality Date   CARDIAC CATHETERIZATION  11/25/2010   Predilation balloon-Apex monorail 2x83mm, Cutting balloon-2.25x39mm, resulting in a reduction of 100% stenosis down to less than 10%   CARDIAC CATHETERIZATION  07/12/2010   LAD stented with a 3.5x53mm bare-metal Veriflex stent resulting in a reduction of 100% lesion to 0%   CARDIAC DEFIBRILLATOR PLACEMENT  03/02/2011   Medtronic Protecta XT DR, model #Z610RUE, serial #[id number]   COLONOSCOPY     GOLD SEED IMPLANT N/A 07/21/2021   Procedure: GOLD SEED IMPLANT;  Surgeon: Heloise Purpura, MD;  Location: [facility] ORS;  Service: Urology;  Laterality: N/A;  NEEDS 30 MIN   HERNIA REPAIR  2018   ICD GENERATOR CHANGEOUT N/A 02/24/2019   Procedure: ICD GENERATOR CHANGEOUT;  Surgeon: Thurmon Fair, MD;  Location: [facility] INVASIVE CV LAB;  Service: Cardiovascular;  Laterality: N/A;   PACEMAKER PLACEMENT  07/14/2010   Temporary placement of pacemaker, if rhythm issue continues will need a permanent device   SPACE OAR INSTILLATION N/A 07/21/2021   Procedure: SPACE OAR INSTILLATION;  Surgeon: Heloise Purpura, MD;  Location: [facility] ORS;  Service: Urology;  Laterality: N/A;    Current Medications: Outpatient Medications Prior to Visit  Medication Sig Dispense Refill   acetaminophen (TYLENOL) 500 MG tablet Take 1,000 mg by mouth every 6 (six) hours as needed for mild pain.     atorvastatin (LIPITOR) 80 MG tablet Take 1 tablet (80 mg total) by mouth daily. (Patient not taking: Reported on 10/16/2022) 90 tablet 3   Cholecalciferol (VITAMIN D) 125 MCG (5000 UT) CAPS Take 5,000 Units by mouth daily.     dapagliflozin propanediol (FARXIGA) 10 MG TABS tablet Take 10 mg by mouth daily.     Multiple Vitamin (MULTIVITAMIN) tablet Take 1 tablet by mouth daily.     sacubitril-valsartan (ENTRESTO) 24-26 MG Take 1 tablet by mouth  2 (two) times daily. 180 tablet 0    spironolactone (ALDACTONE) 25 MG tablet Take 0.5 tablets (12.5 mg total) by mouth every other day. TAKE EOD ON EVEN DAYS 15 tablet 6   tamsulosin (FLOMAX) 0.4 MG CAPS capsule Take 0.4 mg by mouth at bedtime.     warfarin (COUMADIN) 5 MG tablet Take 1 tablet daily except 1/2 tablet on Wednesdays and Fridays or as directed. (Patient taking differently: Take 2-5 mg by mouth See admin instructions. Take 1 tablet daily except 1/2 tablet on Monday, Wednesdays and Fridays or as directed.) 90 tablet 1   ZENPEP 40000-126000 units CPEP Take 1 capsule by mouth 3 (three) times daily with meals.     carvedilol (COREG) 6.25 MG tablet Take 1 tablet (6.25 mg total) by mouth 2 (two) times daily with a meal. 180 tablet 3   furosemide (LASIX) 20 MG tablet Take 3 tablets (60 mg total) by mouth daily. 90 tablet 3   nitroGLYCERIN (NITROSTAT) 0.4 MG SL tablet Place 1 tablet (0.4 mg total) under the tongue every 5 (five) minutes as needed. Usual dose is q 5 minutes x 3 doses. (Patient not taking: Reported on 10/12/2022) 75 tablet 0   No facility-administered medications prior to visit.     Allergies:   Patient has no known allergies.   Social History   Socioeconomic History   Marital status: Single    Spouse name: Not on file   Number of children: 1   Years of education: Not on file   Highest education level: Bachelor's degree (e.g., BA, AB, BS)  Occupational History   Occupation: works on Systems analyst  Tobacco Use   Smoking status: Former    Types: Cigarettes, Cigars    Quit date: 2012    Years since quitting: 12.3   Smokeless tobacco: Never  Vaping Use   Vaping Use: Never used  Substance and Sexual Activity   Alcohol use: Yes    Alcohol/week: 2.0 - 3.0 standard drinks of alcohol    Types: 2 - 3 drink(s) per week   Drug use: No   Sexual activity: Not Currently  Other Topics Concern   Not on file  Social History Narrative   Not on file   Social Determinants of Health   Financial Resource Strain: Low  Risk  (05/08/2022)   Overall Financial Resource Strain (CARDIA)    Difficulty of Paying Living Expenses: Not hard at all  Food Insecurity: No Food Insecurity (10/16/2022)   Hunger Vital Sign    Worried About Running Out of Food in the Last Year: Never true    Ran Out of Food in the Last Year: Never true  Transportation Needs: No Transportation Needs (10/16/2022)   PRAPARE - Administrator, Civil Service (Medical): No    Lack of Transportation (Non-Medical): No  Physical Activity: Not on file  Stress: Not on file  Social Connections: Not on file    Socially he is single, he was born in [place].  He has 1 child.  There is remote tobacco history.    Family History:  He is from [place].  ROS General: Negative; No fevers, chills, or night sweats;  HEENT: Negative; No changes in vision or hearing, sinus congestion, difficulty swallowing Pulmonary: No recent wheezing Cardiovascular: See HPI GI: Negative; No nausea, vomiting, diarrhea, or abdominal pain GU: Positive for prostate biopsy, erectile dysfunction; No dysuria, hematuria, or difficulty voiding Musculoskeletal: Negative; no myalgias, joint pain, or weakness Hematologic/Oncology: Negative; no easy bruising, bleeding  Endocrine: Negative; no heat/cold intolerance; no diabetes Neuro: Negative; no changes in balance, headaches Skin: Negative; No rashes or skin lesions Psychiatric: Negative; No behavioral problems, depression Sleep: Negative; No snoring, daytime sleepiness, hypersomnolence, bruxism, restless legs, hypnogognic hallucinations, no cataplexy Other comprehensive 14 point system review is negative.   PHYSICAL EXAM:   VS:  BP 126/76   Pulse 62   Ht 5\' 9"  (1.753 m)   Wt 174 lb (78.9 kg)   SpO2 96%   BMI 25.70 kg/m     Repeat blood pressure by me was 104/68 supine and 106/62 standing  Wt Readings from Last 3 Encounters:  10/18/22 154 lb 11.2 oz (70.2 kg)  10/12/22 174 lb (78.9 kg)  07/10/22 166 lb (75.3  kg)    General: Alert, oriented, no distress.  Skin: normal turgor, no rashes, warm and dry HEENT: Normocephalic, atraumatic. Pupils equal round and reactive to light; sclera anicteric; extraocular muscles intact;  Nose without nasal septal hypertrophy Mouth/Parynx benign; Mallinpatti scale Neck: No JVD, no carotid bruits; normal carotid upstroke Lungs: clear to ausculatation and percussion; no wheezing or rales Chest wall: without tenderness to palpitation Heart: PMI not displaced, RRR, s1 s2 normal, 1/6 systolic murmur, no diastolic murmur, no rubs, gallops, thrills, or heaves Abdomen: soft, nontender; no hepatosplenomehaly, BS+; abdominal aorta nontender and not dilated by palpation. Back: no CVA tenderness Pulses 2+ Musculoskeletal: full range of motion, normal strength, no joint deformities Extremities: Bilateral lower extremity edema 1+;no clubbing cyanosis, Homan's sign negative  Neurologic: grossly nonfocal; Cranial nerves grossly wnl Psychologic: Normal mood and affect   Studies/Labs Reviewed:   April 18, 2024ECG (independently read by me): Atrial paced at 62, PR 210, QS V1-4 c/w old anterior MI  May 30, 2022 ECG (independently read by me): Sinus rhythm, LAD IVCD, old anterior MI  April 14, 2022 ECG (independently read by me): Atrial paced, PR 228 msec, old anterior infarct, inferolateral t changes    December 28, 2020 ECG (independently read by me):  Atrial paced, PR 216; QS V1-4 c/w prior anterior MI; PVC, QTc 418  July 21, 2020 ECG (independently read by me): Atrial paced, prolonged AV conduction,PR 246 msec , LAHB, Anterolateral ST changes  April 12, 2020 ECG (independently read by me): Atrial paced at 63; old anterolateral infarct,  LAHB, QTc 417 msec  September 27 2019 ECG (independently read by me): Atrially paced rhythm at 72 bpm.  One PVC.  Poor progression consistent with old anterior infarction.  Anterolateral ST-T changes   September 2016 ECG (independently  read by me): Atrially paced rhythm at 64 bpm.  No ectopy.  Poor anterior R-wave progression concordant with old anterior MI.   March 2016 ECG (independently read by me): Atrially paced rhythm at 74 bpm.  Increased PR interval at 212 ms.  ST-T changes anterolaterally.   November 2015 ECG (independently read by me): Atrially paced rhythm.  Old anterior wall MI.  Per Ms. Lee noted ST-T changes laterally   September 2015 ECG (independently read by me): Sinus rhythm at 63 beats per minute.  Old anterior wall myocardial infarction, poor R. progression V1 through V4 and T-wave changes V3 through V6, leads 1, and L.    August 18, 2013 ECG (independently read by me):  Atrially paced at 64 beats per minute. QTc interval 427 ms. QRS duration 98 ms. Diffuse anterolateral T-wave changes secondary to his prior MI     Prior ECG of 02/27/2013: Atrial  paced rhythm at 64 beats per minute. Evidence  for his prior anterolateral wall myocardial infarction with diffuse T-wave abnormality and precordial Q waves V1 through V5    Recent Labs:    Latest Ref Rng & Units 10/18/2022   12:26 AM 10/17/2022   12:45 AM 10/16/2022    1:00 AM  BMP  Glucose 70 - 99 mg/dL 130  865  94   BUN 8 - 23 mg/dL 18  21  19    Creatinine 0.61 - 1.24 mg/dL 7.84  6.96  2.95   Sodium 135 - 145 mmol/L 135  136  139   Potassium 3.5 - 5.1 mmol/L 3.7  3.8  3.2   Chloride 98 - 111 mmol/L 102  102  104   CO2 22 - 32 mmol/L 25  22  21    Calcium 8.9 - 10.3 mg/dL 8.3  8.6  8.8         Latest Ref Rng & Units 05/07/2022    7:42 AM 04/18/2022    9:58 AM 04/23/2020    9:15 AM  Hepatic Function  Total Protein 6.5 - 8.1 g/dL 6.6  7.3  7.3   Albumin 3.5 - 5.0 g/dL 3.3  4.3  4.3   AST 15 - 41 U/L 46  22  25   ALT 0 - 44 U/L 24  14  13    Alk Phosphatase 38 - 126 U/L 62  86  65   Total Bilirubin 0.3 - 1.2 mg/dL 0.7  0.8  0.7        Latest Ref Rng & Units 10/18/2022   10:39 AM 10/15/2022   11:18 AM 10/15/2022   11:10 AM  CBC  WBC 4.0 -  10.5 K/uL 7.8   7.0   Hemoglobin 13.0 - 17.0 g/dL 28.4  13.2    44.0  10.2   Hematocrit 39.0 - 52.0 % 40.9  45.0    44.0  43.7   Platelets 150 - 400 K/uL 213   239    Lab Results  Component Value Date   MCV 91.5 10/18/2022   MCV 95.8 10/15/2022   MCV 100.7 (H) 05/07/2022   Lab Results  Component Value Date   TSH 2.280 04/23/2020   Lab Results  Component Value Date   HGBA1C 6.4 (H) 05/08/2022     BNP    Component Value Date/Time   BNP 2,147.0 (H) 10/15/2022 1110   BNP 124.2 (H) 09/03/2014 0803    ProBNP    Component Value Date/Time   PROBNP 587 (H) 04/18/2022 0958   PROBNP 9719.0 (H) 11/23/2010 2006     Lipid Panel     Component Value Date/Time   CHOL 121 04/18/2022 0958   CHOL 161 07/01/2013 0838   TRIG 50 04/18/2022 0958   TRIG 108 07/01/2013 0838   HDL 45 04/18/2022 0958   HDL 43 07/01/2013 0838   CHOLHDL 2.7 04/18/2022 0958   CHOLHDL 3.3 09/03/2014 0803   VLDL 16 09/03/2014 0803   LDLCALC 64 04/18/2022 0958   LDLCALC 96 07/01/2013 0838   LABVLDL 12 04/18/2022 0958     RADIOLOGY: DG Chest Port 1 View  Result Date: 10/15/2022 CLINICAL DATA:  Shortness of breath. EXAM: PORTABLE CHEST 1 VIEW COMPARISON:  Two-view chest x-ray 05/09/2022 FINDINGS: Heart is mildly enlarged. Pericardial calcifications are again noted. Pacing wires are stable. Diffuse interstitial and airspace opacities are present, left greater than right. A left pleural effusion is present. IMPRESSION: 1. Diffuse interstitial and airspace opacities, left greater than right. This is concerning for edema or  infection. 2. Left pleural effusion. Electronically Signed   By: Marin Roberts M.D.   On: 10/15/2022 11:52     Additional studies/ records that were reviewed today include:   ECHO 01/05/2020 IMPRESSIONS   1. Since the last study on 04/18/2017 remains very low at 20-25% with  apical aneurysm and no thrombus on Definity echocontrast images.   2. Left ventricular ejection fraction,  by estimation, is 20 to 25%. The  left ventricle has severely decreased function. The left ventricle  demonstrates global hypokinesis. The left ventricular internal cavity size  was moderately dilated. Left  ventricular diastolic parameters are consistent with Grade I diastolic  dysfunction (impaired relaxation).   3. Right ventricular systolic function is normal. The right ventricular  size is normal. There is normal pulmonary artery systolic pressure. The  estimated right ventricular systolic pressure is 23.1 mmHg.   4. The mitral valve is normal in structure. Mild mitral valve  regurgitation. No evidence of mitral stenosis.   5. The aortic valve is normal in structure. Aortic valve regurgitation is  not visualized. No aortic stenosis is present.   6. The inferior vena cava is normal in size with greater than 50%  respiratory variability, suggesting right atrial pressure of 3 mmHg.    ECHO: 05/09/2022  1. On contrast images, there is an adherent mass at the inferoseptal  apical portion that is consistent with laminated/chronic thrombus. No  mobile aspects seen and not appreciated in other views. Left ventricular  ejection fraction, by estimation, is  <20%. The left ventricle has severely decreased function. The left  ventricle demonstrates global hypokinesis. The left ventricular internal  cavity size was moderately dilated. Left ventricular diastolic parameters  are indeterminate.   2. Right ventricular systolic function is moderately reduced. The right  ventricular size is normal. There is mildly elevated pulmonary artery  systolic pressure.   3. Left atrial size was mild to moderately dilated.   4. Right atrial size was mildly dilated.   5. The mitral valve is normal in structure. Trivial mitral valve  regurgitation. No evidence of mitral stenosis.   6. The aortic valve is grossly normal. Aortic valve regurgitation is not  visualized. No aortic stenosis is present.   7. The  inferior vena cava is dilated in size with <50% respiratory  variability, suggesting right atrial pressure of 15 mmHg.   Comparison(s): Changes from prior study are noted. Prior EF 20-25%, now  <20%.   Conclusion(s)/Recommendation(s): Adherent LV thrombus seen at inferoseptal  apical segment. While patient has known prior LV thrombus, I did not see  these on echo contrast images from 2021 study. Findings communicated to  Dr. Jens Som.   ASSESSMENT:    1. Coronary artery disease of native artery of native heart with stable angina pectoris (HCC)   2. Chronic combined systolic and diastolic heart failure (HCC)   3. Cardiomyopathy, ischemic   4. Long term current use of anticoagulant therapy   5. Bilateral lower extremity edema   6. Implantable cardioverter-defibrillator (ICD) in situ   7. Hyperlipidemia LDL goal <70   8. Apical mural thrombus     PLAN:  1.  CAD: Steven. Gosling suffered a large anterior wall myocardial infarction with late presentation in January 2012.  He has been without recurrent anginal symptomatology.  During his most recent hospitalization in November 2023, amlodipine and Imdur were discontinued due to low blood pressure in the hope of increasing Entresto.  He continues to be on carvedilol 6.25 mg twice  a day, and is not having any anginal symptomatology.  2.  Ischemic cardiomyopathy: He was previously demonstrated to have an EF of 20 to 25%.  Initially had also developed apical thrombus for which he was on warfarin therapy.  His echo Doppler study from July 2021 continue to show EF of 20 to 25%.  His most recent echo Doppler study from May 09, 2022 showed EF estimate less than 20% with severe decreased function in a global hypokinetic pattern.  LV internal cavity was moderately dilated.  There was biatrial enlargement.  The inferior vena cava was dilated in size with less than 50% respiratory variability suggesting right atrial pressure of 15 mmHg.  Recently, he has  noticed lower extremity edema which he contributes to his work at 4 to 5 hours/day on his feet on the job.  He has continued to be on Farxiga 10 mg, carvedilol 6.25 mg twice a day, Lasix 60 mg in the morning in addition to spironolactone 12.5 mg every other day.  I have recommended support stockings with 20 to 30 mm of support and that he take an extra Lasix 20 mg in the afternoon as needed.  3: ICD: He underwent a generator change out for his dual-chamber ICD in August 2020.  He is followed by Dr. Royann Shivers and his evaluation in December 2022 remained stable with normal device function.  He has been demonstrated to have occasional short bursts of nonsustained VT which is asymptomatic.  His rhythm today is stable.  4.  Warfarin anticoagulation.  Previously he was found to have a moderate size apical mural thrombus.  His most recent echo of May 09, 2022 again showed an inherent mass at the inferoseptal apical portion consistent with laminated/chronic thrombus.  He is followed in Coumadin clinic.  5: Hyperlipidemia.  I reviewed his most recent laboratory from October 11, 2022.  Lipid studies are excellent with total cholesterol 101, triglycerides 61, HDL 35 which is low, and LDL at 52 which had improved from 64 in October 2023.  6.  History of nonsustained VT documented prior to his generator change.  He continues to undergo every 3 months device checks followed by Dr. Royann Shivers.  At present he will continue current therapy.  I will see him in 6 months for follow-up evaluation or sooner as needed.  Medication Adjustments/Labs and Tests Ordered: Current medicines are reviewed at length with the patient today.  Concerns regarding medicines are outlined above.  Medication changes, Labs and Tests ordered today are listed in the Patient Instructions below. Patient Instructions  Medication Instructions:  Your physician has recommended you make the following change in your medication:  CHANGE: Lasix 60 mg  in the morning. Take an extra 20 mg in the evening if swelling (as needed).   *If you need a refill on your cardiac medications before your next appointment, please call your pharmacy*   Lab Work: None    Testing/Procedures: None   Follow-Up: At [facility], you and your health needs are our priority.  As part of our continuing mission to provide you with exceptional heart care, we have created designated Provider Care Teams.  These Care Teams include your primary Cardiologist (physician) and Advanced Practice Providers (APPs -  Physician Assistants and Nurse Practitioners) who all work together to provide you with the care you need, when you need it.  We recommend signing up for the patient portal called "MyChart".  Sign up information is provided on this After Visit Summary.  MyChart  is used to connect with patients for Virtual Visits (Telemedicine).  Patients are able to view lab/test results, encounter notes, upcoming appointments, etc.  Non-urgent messages can be sent to your provider as well.   To learn more about what you can do with MyChart, go to [email or web address].    Your next appointment:   6 month(s)  Provider:   Nicki Guadalajara, MD     Other Instructions PLEASE PURCHASE AND WEAR COMPRESSION STOCKINGS DAILY AND TAKE OFF AT BEDTIME.  I recommend getting support socks/stockings. 20-30 mmHg is the preferred amount of compression.    Compression stockings are elastic socks that squeeze the legs. They help to increase blood flow to the legs and to decrease swelling in the legs from fluid retention, and reduce the chance of developing blood clots in the lower legs. Please put on in the AM when dressing and off at night when dressing for bed.   [employer]  [address] ([phone]); [place], [place] [address]; [phone]  EMAIL:   [email or web address].   PLEASE MAKE SURE TO ELEVATE YOUR FEET & LEGS ABOVE YOUR HEART WHILE SITTING, THIS WILL  HELP WITH THE SWELLING ALSO.      Signed, Nicki Guadalajara, MD  10/22/2022 11:11 AM    [facility] [address], [place], [place]  [address] Phone: [phone]

## 2022-10-12 NOTE — Patient Instructions (Signed)
Description   Continue taking warfarin 1 tablet daily except for 1/2 tablet on Mondays, Wednesdays and Fridays. Recheck INR in 6 weeks.  Coumadin Clinic [phone]

## 2022-10-15 ENCOUNTER — Encounter (HOSPITAL_COMMUNITY): Payer: Self-pay

## 2022-10-15 ENCOUNTER — Inpatient Hospital Stay (HOSPITAL_COMMUNITY)
Admission: EM | Admit: 2022-10-15 | Discharge: 2022-10-18 | DRG: 291 | Disposition: A | Discharge status: Home / Self Care | Payer: 59 | Attending: Cardiology | Admitting: Cardiology

## 2022-10-15 ENCOUNTER — Other Ambulatory Visit: Payer: Self-pay

## 2022-10-15 ENCOUNTER — Emergency Department (HOSPITAL_COMMUNITY): Payer: 59

## 2022-10-15 DIAGNOSIS — Z8679 Personal history of other diseases of the circulatory system: Secondary | ICD-10-CM | POA: Diagnosis not present

## 2022-10-15 DIAGNOSIS — Z95810 Presence of automatic (implantable) cardiac defibrillator: Secondary | ICD-10-CM | POA: Diagnosis not present

## 2022-10-15 DIAGNOSIS — I493 Ventricular premature depolarization: Secondary | ICD-10-CM | POA: Diagnosis present

## 2022-10-15 DIAGNOSIS — E785 Hyperlipidemia, unspecified: Secondary | ICD-10-CM | POA: Diagnosis present

## 2022-10-15 DIAGNOSIS — R7303 Prediabetes: Secondary | ICD-10-CM | POA: Diagnosis present

## 2022-10-15 DIAGNOSIS — I513 Intracardiac thrombosis, not elsewhere classified: Secondary | ICD-10-CM | POA: Diagnosis present

## 2022-10-15 DIAGNOSIS — I130 Hypertensive heart and chronic kidney disease with heart failure and stage 1 through stage 4 chronic kidney disease, or unspecified chronic kidney disease: Principal | ICD-10-CM | POA: Diagnosis present

## 2022-10-15 DIAGNOSIS — R0603 Acute respiratory distress: Secondary | ICD-10-CM | POA: Diagnosis present

## 2022-10-15 DIAGNOSIS — N183 Chronic kidney disease, stage 3 unspecified: Secondary | ICD-10-CM | POA: Diagnosis present

## 2022-10-15 DIAGNOSIS — I252 Old myocardial infarction: Secondary | ICD-10-CM

## 2022-10-15 DIAGNOSIS — J9621 Acute and chronic respiratory failure with hypoxia: Secondary | ICD-10-CM | POA: Diagnosis present

## 2022-10-15 DIAGNOSIS — I472 Ventricular tachycardia, unspecified: Secondary | ICD-10-CM | POA: Diagnosis present

## 2022-10-15 DIAGNOSIS — I2489 Other forms of acute ischemic heart disease: Secondary | ICD-10-CM | POA: Insufficient documentation

## 2022-10-15 DIAGNOSIS — Z79899 Other long term (current) drug therapy: Secondary | ICD-10-CM | POA: Diagnosis not present

## 2022-10-15 DIAGNOSIS — Z7901 Long term (current) use of anticoagulants: Secondary | ICD-10-CM | POA: Diagnosis not present

## 2022-10-15 DIAGNOSIS — Z8701 Personal history of pneumonia (recurrent): Secondary | ICD-10-CM

## 2022-10-15 DIAGNOSIS — I5021 Acute systolic (congestive) heart failure: Secondary | ICD-10-CM | POA: Diagnosis present

## 2022-10-15 DIAGNOSIS — E876 Hypokalemia: Secondary | ICD-10-CM | POA: Diagnosis present

## 2022-10-15 DIAGNOSIS — J9601 Acute respiratory failure with hypoxia: Secondary | ICD-10-CM

## 2022-10-15 DIAGNOSIS — Z87891 Personal history of nicotine dependence: Secondary | ICD-10-CM

## 2022-10-15 DIAGNOSIS — I10 Essential (primary) hypertension: Secondary | ICD-10-CM | POA: Diagnosis present

## 2022-10-15 DIAGNOSIS — I5043 Acute on chronic combined systolic (congestive) and diastolic (congestive) heart failure: Secondary | ICD-10-CM

## 2022-10-15 DIAGNOSIS — I2510 Atherosclerotic heart disease of native coronary artery without angina pectoris: Secondary | ICD-10-CM | POA: Diagnosis present

## 2022-10-15 DIAGNOSIS — Z955 Presence of coronary angioplasty implant and graft: Secondary | ICD-10-CM | POA: Diagnosis not present

## 2022-10-15 DIAGNOSIS — R7989 Other specified abnormal findings of blood chemistry: Secondary | ICD-10-CM | POA: Diagnosis not present

## 2022-10-15 DIAGNOSIS — I429 Cardiomyopathy, unspecified: Secondary | ICD-10-CM | POA: Diagnosis present

## 2022-10-15 DIAGNOSIS — Z7984 Long term (current) use of oral hypoglycemic drugs: Secondary | ICD-10-CM

## 2022-10-15 DIAGNOSIS — I5023 Acute on chronic systolic (congestive) heart failure: Secondary | ICD-10-CM | POA: Diagnosis present

## 2022-10-15 DIAGNOSIS — N1831 Chronic kidney disease, stage 3a: Secondary | ICD-10-CM | POA: Diagnosis present

## 2022-10-15 LAB — I-STAT CHEM 8, ED
BUN: 22 mg/dL (ref 8–23)
Calcium, Ion: 1.08 mmol/L — ABNORMAL LOW (ref 1.15–1.40)
Chloride: 110 mmol/L (ref 98–111)
Creatinine, Ser: 1.5 mg/dL — ABNORMAL HIGH (ref 0.61–1.24)
Glucose, Bld: 192 mg/dL — ABNORMAL HIGH (ref 70–99)
HCT: 45 % (ref 39.0–52.0)
Hemoglobin: 15.3 g/dL (ref 13.0–17.0)
Potassium: 3.4 mmol/L — ABNORMAL LOW (ref 3.5–5.1)
Sodium: 142 mmol/L (ref 135–145)
TCO2: 20 mmol/L — ABNORMAL LOW (ref 22–32)

## 2022-10-15 LAB — I-STAT VENOUS BLOOD GAS, ED
Acid-base deficit: 8 mmol/L — ABNORMAL HIGH (ref 0.0–2.0)
Bicarbonate: 19.2 mmol/L — ABNORMAL LOW (ref 20.0–28.0)
Calcium, Ion: 1.02 mmol/L — ABNORMAL LOW (ref 1.15–1.40)
HCT: 44 % (ref 39.0–52.0)
Hemoglobin: 15 g/dL (ref 13.0–17.0)
O2 Saturation: 51 %
Potassium: 3.6 mmol/L (ref 3.5–5.1)
Sodium: 142 mmol/L (ref 135–145)
TCO2: 20 mmol/L — ABNORMAL LOW (ref 22–32)
pCO2, Ven: 42.3 mmHg — ABNORMAL LOW (ref 44–60)
pH, Ven: 7.264 (ref 7.25–7.43)
pO2, Ven: 31 mmHg — CL (ref 32–45)

## 2022-10-15 LAB — CBC WITH DIFFERENTIAL/PLATELET
Abs Immature Granulocytes: 0.01 10*3/uL (ref 0.00–0.07)
Basophils Absolute: 0.1 10*3/uL (ref 0.0–0.1)
Basophils Relative: 1 %
Eosinophils Absolute: 0.9 10*3/uL — ABNORMAL HIGH (ref 0.0–0.5)
Eosinophils Relative: 13 %
HCT: 43.7 % (ref 39.0–52.0)
Hemoglobin: 14 g/dL (ref 13.0–17.0)
Immature Granulocytes: 0 %
Lymphocytes Relative: 39 %
Lymphs Abs: 2.8 10*3/uL (ref 0.7–4.0)
MCH: 30.7 pg (ref 26.0–34.0)
MCHC: 32 g/dL (ref 30.0–36.0)
MCV: 95.8 fL (ref 80.0–100.0)
Monocytes Absolute: 1 10*3/uL (ref 0.1–1.0)
Monocytes Relative: 15 %
Neutro Abs: 2.2 10*3/uL (ref 1.7–7.7)
Neutrophils Relative %: 32 %
Platelets: 239 10*3/uL (ref 150–400)
RBC: 4.56 MIL/uL (ref 4.22–5.81)
RDW: 15.3 % (ref 11.5–15.5)
WBC: 7 10*3/uL (ref 4.0–10.5)
nRBC: 0 % (ref 0.0–0.2)

## 2022-10-15 LAB — BASIC METABOLIC PANEL
Anion gap: 10 (ref 5–15)
BUN: 19 mg/dL (ref 8–23)
CO2: 20 mmol/L — ABNORMAL LOW (ref 22–32)
Calcium: 8.3 mg/dL — ABNORMAL LOW (ref 8.9–10.3)
Chloride: 108 mmol/L (ref 98–111)
Creatinine, Ser: 1.6 mg/dL — ABNORMAL HIGH (ref 0.61–1.24)
GFR, Estimated: 47 mL/min — ABNORMAL LOW (ref >60)
Glucose, Bld: 190 mg/dL — ABNORMAL HIGH (ref 70–99)
Potassium: 3.3 mmol/L — ABNORMAL LOW (ref 3.5–5.1)
Sodium: 138 mmol/L (ref 135–145)

## 2022-10-15 LAB — PROTIME-INR
INR: 2.6 — ABNORMAL HIGH (ref 0.8–1.2)
Prothrombin Time: 27.9 seconds — ABNORMAL HIGH (ref 11.4–15.2)

## 2022-10-15 LAB — TROPONIN I (HIGH SENSITIVITY)
Troponin I (High Sensitivity): 43 ng/L — ABNORMAL HIGH (ref <18)
Troponin I (High Sensitivity): 123 ng/L (ref <18)

## 2022-10-15 LAB — BRAIN NATRIURETIC PEPTIDE: B Natriuretic Peptide: 2147 pg/mL — ABNORMAL HIGH (ref 0.0–100.0)

## 2022-10-15 MED ORDER — PANCRELIPASE (LIP-PROT-AMYL) 12000-38000 UNITS PO CPEP
36000.0000 [IU] | ORAL_CAPSULE | Freq: Three times a day (TID) | ORAL | Status: DC
  Administered 2022-10-15 – 2022-10-18 (×7): 36000 [IU] via ORAL
  Filled 2022-10-15 (×2): qty 1
  Filled 2022-10-15 (×2): qty 3
  Filled 2022-10-15: qty 1
  Filled 2022-10-15: qty 3
  Filled 2022-10-15: qty 1
  Filled 2022-10-15 (×2): qty 3

## 2022-10-15 MED ORDER — SPIRONOLACTONE 12.5 MG HALF TABLET
12.5000 mg | ORAL_TABLET | ORAL | Status: DC
  Administered 2022-10-16 – 2022-10-18 (×2): 12.5 mg via ORAL
  Filled 2022-10-15 (×2): qty 1

## 2022-10-15 MED ORDER — ONDANSETRON HCL 4 MG/2ML IJ SOLN: 4.0000 mg | Freq: Four times a day (QID) | INTRAMUSCULAR | Status: DC | PRN

## 2022-10-15 MED ORDER — ACETAMINOPHEN 500 MG PO TABS: 1000.0000 mg | ORAL_TABLET | Freq: Four times a day (QID) | ORAL | Status: DC | PRN

## 2022-10-15 MED ORDER — ATORVASTATIN CALCIUM 80 MG PO TABS
80.0000 mg | ORAL_TABLET | Freq: Every day | ORAL | Status: DC
  Administered 2022-10-15 – 2022-10-18 (×4): 80 mg via ORAL
  Filled 2022-10-15: qty 2
  Filled 2022-10-15 (×2): qty 1
  Filled 2022-10-15: qty 2

## 2022-10-15 MED ORDER — WARFARIN - PHARMACIST DOSING INPATIENT: Freq: Every day | Status: DC

## 2022-10-15 MED ORDER — PANCRELIPASE (LIP-PROT-AMYL) 40000-126000 UNITS PO CPEP: 1.0000 | ORAL_CAPSULE | Freq: Three times a day (TID) | ORAL | Status: DC

## 2022-10-15 MED ORDER — FUROSEMIDE 10 MG/ML IJ SOLN
60.0000 mg | Freq: Once | INTRAMUSCULAR | Status: AC
  Administered 2022-10-15: 60 mg via INTRAVENOUS
  Filled 2022-10-15: qty 6

## 2022-10-15 MED ORDER — FUROSEMIDE 10 MG/ML IJ SOLN
60.0000 mg | Freq: Two times a day (BID) | INTRAMUSCULAR | Status: DC
  Administered 2022-10-15 – 2022-10-18 (×6): 60 mg via INTRAVENOUS
  Filled 2022-10-15 (×6): qty 6

## 2022-10-15 MED ORDER — NITROGLYCERIN IN D5W 200-5 MCG/ML-% IV SOLN: 0.0000 ug/min | INTRAVENOUS | Status: DC

## 2022-10-15 MED ORDER — ACETAMINOPHEN 325 MG PO TABS: 650.0000 mg | ORAL_TABLET | ORAL | Status: DC | PRN

## 2022-10-15 MED ORDER — TAMSULOSIN HCL 0.4 MG PO CAPS
0.4000 mg | ORAL_CAPSULE | Freq: Every day | ORAL | Status: DC
  Administered 2022-10-15 – 2022-10-16 (×2): 0.4 mg via ORAL
  Filled 2022-10-15 (×2): qty 1

## 2022-10-15 MED ORDER — SACUBITRIL-VALSARTAN 24-26 MG PO TABS
1.0000 | ORAL_TABLET | Freq: Two times a day (BID) | ORAL | Status: DC
  Administered 2022-10-15 – 2022-10-18 (×5): 1 via ORAL
  Filled 2022-10-15 (×5): qty 1

## 2022-10-15 MED ORDER — FUROSEMIDE 10 MG/ML IJ SOLN
INTRAMUSCULAR | Status: AC
  Filled 2022-10-15: qty 4

## 2022-10-15 MED ORDER — SODIUM CHLORIDE 0.9% FLUSH: 3.0000 mL | INTRAVENOUS | Status: DC | PRN

## 2022-10-15 MED ORDER — NITROGLYCERIN IN D5W 200-5 MCG/ML-% IV SOLN
INTRAVENOUS | Status: AC
  Administered 2022-10-15: 5 ug/min via INTRAVENOUS
  Filled 2022-10-15: qty 250

## 2022-10-15 MED ORDER — SODIUM CHLORIDE 0.9% FLUSH
3.0000 mL | Freq: Two times a day (BID) | INTRAVENOUS | Status: DC
  Administered 2022-10-15 – 2022-10-18 (×6): 3 mL via INTRAVENOUS

## 2022-10-15 MED ORDER — DAPAGLIFLOZIN PROPANEDIOL 10 MG PO TABS
10.0000 mg | ORAL_TABLET | Freq: Every day | ORAL | Status: DC
  Administered 2022-10-16 – 2022-10-18 (×3): 10 mg via ORAL
  Filled 2022-10-15 (×3): qty 1

## 2022-10-15 MED ORDER — WARFARIN SODIUM 5 MG PO TABS
5.0000 mg | ORAL_TABLET | Freq: Once | ORAL | Status: AC
  Administered 2022-10-15: 5 mg via ORAL
  Filled 2022-10-15: qty 1

## 2022-10-15 MED ORDER — SODIUM CHLORIDE 0.9 % IV SOLN: 250.0000 mL | INTRAVENOUS | Status: DC | PRN

## 2022-10-15 NOTE — Progress Notes (Signed)
ANTICOAGULATION CONSULT NOTE - Initial Consult  Pharmacy Consult for warfarin Indication: LV thrombus  No Known Allergies  Patient Measurements: Height:  (175.3 cm) Weight: 80.3 kg (177 lb) IBW/kg (Calculated) : 70.7  Vital Signs: Temp: 97.9 F (36.6 C) (04/21 1509) Temp Source: Axillary (04/21 1509) BP: 107/72 (04/21 1530) Pulse Rate: 85 (04/21 1551)  Labs: Recent Labs    10/15/22 1110 10/15/22 1118 10/15/22 1403  HGB 14.0 15.0  15.3  --   HCT 43.7 44.0  45.0  --   PLT 239  --   --   LABPROT 27.9*  --   --   INR 2.6*  --   --   CREATININE 1.60* 1.50*  --   TROPONINIHS 43*  --  123*    Estimated Creatinine Clearance: 47.8 mL/min (A) (by C-G formula based on SCr of 1.5 mg/dL (H)).   Medical History: Past Medical History:  Diagnosis Date   Acute MI, anterior wall    AICD (automatic cardioverter/defibrillator) present    CAD (coronary artery disease)    2D ECHO, 07/13/2011 - EF <25%, LV moderatelty dilated, LA moderately dilatedLEXISCAN, 12/14/2011 - moderate-severe perfusion defect seen in the basal anteroseptal, mid anterior, apicacl anterior, apical, apical inferior, and apical lateral regions, post-stress EF 25%, new EKG changes from baseline abnormalities   Cancer    Prostate   CHF (congestive heart failure) 2012   Hypertension 08/08/2021   Inguinal hernia, left    Pneumonia    November 2023   Pre-diabetes     Assessment: 32 YOM presenting with dyspnea, 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  INR on admission is therapeutic at 2.6  PTA dosing per pt:   daily except 2.5mg  on WMF  Goal of Therapy:  INR 2-3 Monitor platelets by anticoagulation protocol: Yes   Plan:  Warfarin  PO x 1 today Daily INR, s/s bleeding  Daylene Posey, PharmD, [facility] Clinical Pharmacist ED Pharmacist Phone # [phone] 10/15/2022 4:09 PM

## 2022-10-15 NOTE — ED Notes (Signed)
Pt removed from BiPap, placed on 2L [place]. Maintaining O2 sat 97%.

## 2022-10-15 NOTE — H&P (Signed)
Cardiology Admission History and Physical   Patient ID: Steven WRINKLE MRN: [id number]; DOB: 10-Apr-1956   Admission date: 10/15/2022  PCP:  [name], MD   [facility] Providers Cardiologist:  Nicki Guadalajara, MD  Electrophysiologist:  Thurmon Fair, MD      Chief Complaint: Shortness of Breath  Patient Profile:   Steven [name] is a 67 y.o. male with past medical history of HFrEF (EF < 25% in 2013, < 20% by echo in 04/2022), CAD (s/p stenting to LAD in 2012, angioplasty to OM1 in 2012 as well), VT (Medtronic ICD in place), LV thrombus, HTN, HLD and Stage 3 CKD who is being seen 10/15/2022 for the evaluation of CHF at the request of Dr. Rodena Medin.  History of Present Illness:   Mr. Coia was examined by Dr. Tresa Endo on 10/12/2022 but his clinic note is not complete from that date. By review of the AVS, it appears that Lasix was changed to 60 mg daily. Was continued on Atorvastatin 80 mg daily, Coreg 6.25 mg twice daily, Farxiga 10 mg daily, Entresto 24-26 mg twice daily, Spironolactone 12.5 mg every other day and Coumadin for anticoagulation.  He presented to [facility] ED this morning for evaluation of worsening dyspnea which started earlier today. Upon EMS arrival, saturations were at 70% on room air and was placed on BiPAP with quick improvement. In talking with the patient today, he reports having worsening lower extremity edema over the past week and that is why Dr. Tresa Endo increased his Lasix at the time of his office visit. He is on his feet a majority of the time with his job and feels like this contributes to his lower extremity edema. He started to develop worsening shortness of breath yesterday and symptoms acutely worsened this morning. Reports compliance with his Lasix. Says his weight did increase by 2 pounds from yesterday from 170 lbs to 172 lbs this morning (weight was at 166 lbs in 06/2022 and at 167 lbs at the time of hospital discharge in 04/2022). He reports orthopnea  and PND earlier today but no recent symptoms prior to this. No recent abdominal distention. No recent chest pain or palpitations. He is still on BiPAP at this time but reports his breathing has significantly improved since arrival.   Initial labs showed WBC 7.0, Hgb 14.0, platelets 239, Na+ 138, K+ 3.3 and creatinine 1.60 (baseline creatinine 1.3 - 1.5). BNP elevated to 2147. INR at 2.6.  Venous blood gas showed pH 7.264 with pCO2 at 42.3 and pO2 at 31. Initial Hs Troponin 43 with repeat pending. CXR showed diffuse interstitial and airspace opacities with left greater than right which is concerning for edema or infection along with a left pleural effusion. EKG showing sinus tachycardia, HR 124 with PVC's and anterior infarct pattern.   He was started on IV Lasix and received  with a recorded net output of 1 L thus far.   Past Medical History:  Diagnosis Date   Acute MI, anterior wall    AICD (automatic cardioverter/defibrillator) present    CAD (coronary artery disease)    2D ECHO, 07/13/2011 - EF <25%, LV moderatelty dilated, LA moderately dilatedLEXISCAN, 12/14/2011 - moderate-severe perfusion defect seen in the basal anteroseptal, mid anterior, apicacl anterior, apical, apical inferior, and apical lateral regions, post-stress EF 25%, new EKG changes from baseline abnormalities   Cancer    Prostate   CHF (congestive heart failure) 2012   Hypertension 08/08/2021   Inguinal hernia, left  Pneumonia    November 2023   Pre-diabetes     Past Surgical History:  Procedure Laterality Date   CARDIAC CATHETERIZATION  11/25/2010   Predilation balloon-Apex monorail 2x55mm, Cutting balloon-2.25x84mm, resulting in a reduction of 100% stenosis down to less than 10%   CARDIAC CATHETERIZATION  07/12/2010   LAD stented with a 3.5x89mm bare-metal Veriflex stent resulting in a reduction of 100% lesion to 0%   CARDIAC DEFIBRILLATOR PLACEMENT  03/02/2011   Medtronic Protecta XT DR, model #[id number], serial  #[id number]   COLONOSCOPY     GOLD SEED IMPLANT N/A 07/21/2021   Procedure: GOLD SEED IMPLANT;  Surgeon: Heloise Purpura, MD;  Location: [facility] ORS;  Service: Urology;  Laterality: N/A;  NEEDS 30 MIN   HERNIA REPAIR  2018   ICD GENERATOR CHANGEOUT N/A 02/24/2019   Procedure: ICD GENERATOR CHANGEOUT;  Surgeon: Thurmon Fair, MD;  Location: [facility] INVASIVE CV [facility];  Service: Cardiovascular;  Laterality: N/A;   PACEMAKER PLACEMENT  07/14/2010   Temporary placement of pacemaker, if rhythm issue continues will need a permanent device   SPACE OAR INSTILLATION N/A 07/21/2021   Procedure: SPACE OAR INSTILLATION;  Surgeon: Heloise Purpura, MD;  Location: [facility] ORS;  Service: Urology;  Laterality: N/A;     Medications Prior to Admission: Prior to Admission medications   Medication Sig Start Date End Date Taking? Authorizing Provider  acetaminophen (TYLENOL) 500 MG tablet Take 1,000 mg by mouth every 6 (six) hours as needed for mild pain.    [provider]  atorvastatin (LIPITOR) 80 MG tablet Take 1 tablet (80 mg total) by mouth daily. 05/11/22   Azalee Course, PA  carvedilol (COREG) 6.25 MG tablet Take 1 tablet (6.25 mg total) by mouth 2 (two) times daily with a meal. 05/10/22   Azalee Course, PA  Cholecalciferol (VITAMIN D) 125 MCG (5000 UT) CAPS Take 5,000 Units by mouth daily.    [provider]  dapagliflozin propanediol (FARXIGA) 10 MG TABS tablet Take 10 mg by mouth daily.    [provider]  furosemide (LASIX) 20 MG tablet Take 3 tablets (60 mg total) by mouth daily. Take an additional 1 tablet (20 mg) in the evening for swelling as needed 10/12/22   Lennette Bihari, MD  Multiple Vitamin (MULTIVITAMIN) tablet Take 1 tablet by mouth daily.    [provider]  nitroGLYCERIN (NITROSTAT) 0.4 MG SL tablet Place 1 tablet (0.4 mg total) under the tongue every 5 (five) minutes as needed. Usual dose is q 5 minutes x 3 doses. Patient not taking: Reported on 10/12/2022 07/27/21   Lennette Bihari, MD  sacubitril-valsartan (ENTRESTO) 24-26 MG Take 1 tablet by mouth 2 (two) times daily. 05/17/22   Croitoru, Mihai, MD  spironolactone (ALDACTONE) 25 MG tablet Take 0.5 tablets (12.5 mg total) by mouth every other day. TAKE EOD ON EVEN DAYS 04/14/22   Lennette Bihari, MD  tamsulosin (FLOMAX) 0.4 MG CAPS capsule Take 0.4 mg by mouth at bedtime.    [provider]  warfarin (COUMADIN) 5 MG tablet Take 1 tablet daily except 1/2 tablet on Wednesdays and Fridays or as directed. Patient taking differently: Take 2.5-5 mg by mouth See admin instructions. Take 1 tablet daily except 1/2 tablet on Wednesdays and Fridays or as directed. 2.5 mg every Wed, Fri; 5 mg all other days 08/14/22   Lennette Bihari, MD  ZENPEP [phone] units CPEP Take 1 capsule by mouth 3 (three) times daily with meals. 08/09/22   [provider]  Allergies:   No Known Allergies  Social History:   Social History   Socioeconomic History   Marital status: Single    Spouse name: Not on file   Number of children: 1   Years of education: Not on file   Highest education level: Bachelor's degree (e.g., BA, AB, BS)  Occupational History   Occupation: works on Systems analyst  Tobacco Use   Smoking status: Former    Types: Cigarettes, Cigars    Quit date: 2012    Years since quitting: 12.3   Smokeless tobacco: Never  Vaping Use   Vaping Use: Never used  Substance and Sexual Activity   Alcohol use: Yes    Alcohol/week: 2.0 - 3.0 standard drinks of alcohol    Types: 2 - 3 drink(s) per week   Drug use: No   Sexual activity: Not Currently  Other Topics Concern   Not on file  Social History Narrative   Not on file   Social Determinants of Health   Financial Resource Strain: Low Risk  (05/08/2022)   Overall Financial Resource Strain (CARDIA)    Difficulty of Paying Living Expenses: Not hard at all  Food Insecurity: No Food Insecurity (05/08/2022)   Hunger Vital Sign    Worried About Running Out of Food  in the Last Year: Never true    Ran Out of Food in the Last Year: Never true  Transportation Needs: No Transportation Needs (05/08/2022)   PRAPARE - Administrator, Civil Service (Medical): No    Lack of Transportation (Non-Medical): No  Physical Activity: Not on file  Stress: Not on file  Social Connections: Not on file  Intimate Partner Violence: Not At Risk (05/08/2022)   Humiliation, Afraid, Rape, and Kick questionnaire    Fear of Current or Ex-Partner: No    Emotionally Abused: No    Physically Abused: No    Sexually Abused: No    Family History:   The patient's family history is not on file.  Not reviewed today as currently on BiPAP.   ROS:  Please see the history of present illness.  All other ROS reviewed and negative.     Physical Exam/Data:   Vitals:   10/15/22 1130 10/15/22 1145 10/15/22 1200 10/15/22 1215  BP:  126/87 101/70 109/87  Pulse:  99 93 90  Resp:  (!) 26 (!) 27 (!) 27  Temp:      TempSrc:      SpO2:  100% 100% 100%  Weight: 80.3 kg     Height: 5\' 9"  (1.753 m)       Intake/Output Summary (Last 24 hours) at 10/15/2022 1255 Last data filed at 10/15/2022 1253 Gross per 24 hour  Intake --  Output 400 ml  Net -400 ml      10/15/2022   11:30 AM 10/12/2022    9:45 AM 07/10/2022    8:43 AM  Last 3 Weights  Weight (lbs) 177 lb 174 lb 166 lb  Weight (kg) 80.287 kg 78.926 kg 75.297 kg     Body mass index is 26.14 kg/m.  General: Pleasant male. On BiPAP.  HEENT: normal Neck: JVD elevated to jaw line.  Vascular: No carotid bruits; Distal pulses 2+ bilaterally   Cardiac:  normal S1, S2; RRR; no murmur Lungs: rales along bases bilaterally.  Abd: soft, nontender, no hepatomegaly  Ext: 2+ pitting edema bilaterally.  Musculoskeletal:  No deformities, BUE and BLE strength normal and equal Skin: warm and dry  Neuro:  CNs  2-12 intact, no focal abnormalities noted Psych:  Normal affect    EKG:  The ECG that was done was personally reviewed and  demonstrates Sinus tachycardia, HR 124 with PVC's and anterior infarct pattern.    Relevant CV Studies:  Echocardiogram: 04/2022 IMPRESSIONS     1. On contrast images, there is an adherent mass at the inferoseptal  apical portion that is consistent with laminated/chronic thrombus. No  mobile aspects seen and not appreciated in other views. Left ventricular  ejection fraction, by estimation, is  <20%. The left ventricle has severely decreased function. The left  ventricle demonstrates global hypokinesis. The left ventricular internal  cavity size was moderately dilated. Left ventricular diastolic parameters  are indeterminate.   2. Right ventricular systolic function is moderately reduced. The right  ventricular size is normal. There is mildly elevated pulmonary artery  systolic pressure.   3. Left atrial size was mild to moderately dilated.   4. Right atrial size was mildly dilated.   5. The mitral valve is normal in structure. Trivial mitral valve  regurgitation. No evidence of mitral stenosis.   6. The aortic valve is grossly normal. Aortic valve regurgitation is not  visualized. No aortic stenosis is present.   7. The inferior vena cava is dilated in size with <50% respiratory  variability, suggesting right atrial pressure of 15 mmHg.   Comparison(s): Changes from prior study are noted. Prior EF 20-25%, now  <20%.   Conclusion(s)/Recommendation(s): Adherent LV thrombus seen at inferoseptal  apical segment. While patient has known prior LV thrombus, I did not see  these on echo contrast images from 2021 study. Findings communicated to  Dr. Jens Som.   Laboratory Data:  High Sensitivity Troponin:   Recent Labs  Lab 10/15/22 1110  TROPONINIHS 43*      Chemistry Recent Labs  Lab 10/15/22 1110 10/15/22 1118  NA 138 142  142  K 3.3* 3.6  3.4*  CL 108 110  CO2 20*  --   GLUCOSE 190* 192*  BUN 19 22  CREATININE 1.60* 1.50*  CALCIUM 8.3*  --   GFRNONAA 47*  --    ANIONGAP 10  --     No results for input(s): "PROT", "ALBUMIN", "AST", "ALT", "ALKPHOS", "BILITOT" in the last 168 hours. Lipids No results for input(s): "CHOL", "TRIG", "HDL", "LABVLDL", "LDLCALC", "CHOLHDL" in the last 168 hours. Hematology Recent Labs  Lab 10/15/22 1110 10/15/22 1118  WBC 7.0  --   RBC 4.56  --   HGB 14.0 15.0  15.3  HCT 43.7 44.0  45.0  MCV 95.8  --   MCH 30.7  --   MCHC 32.0  --   RDW 15.3  --   PLT 239  --    Thyroid No results for input(s): "TSH", "FREET4" in the last 168 hours. BNP Recent Labs  Lab 10/15/22 1110  BNP 2,147.0*    DDimer No results for input(s): "DDIMER" in the last 168 hours.   Radiology/Studies:  DG Chest Port 1 View  Result Date: 10/15/2022 CLINICAL DATA:  Shortness of breath. EXAM: PORTABLE CHEST 1 VIEW COMPARISON:  Two-view chest x-ray 05/09/2022 FINDINGS: Heart is mildly enlarged. Pericardial calcifications are again noted. Pacing wires are stable. Diffuse interstitial and airspace opacities are present, left greater than right. A left pleural effusion is present. IMPRESSION: 1. Diffuse interstitial and airspace opacities, left greater than right. This is concerning for edema or infection. 2. Left pleural effusion. Electronically Signed   By: Virl Son.D.  On: 10/15/2022 11:52     Assessment and Plan:   1. Acute HFrEF - He has known long-standing cardiomyopathy dating back to at least 2013 with EF at 25% then and less than 20% by echo in 04/2022.  Had noticed worsening lower extremity over the past week but developed worsening respiratory distress starting yesterday. - BNP is elevated to 2147 and CXR shows likely pulmonary edema and a left pleural effusion.  He has been requiring BiPAP but reports significant improvement in his respiratory status since diuresing. Will see if he can be weaned to nasal cannula. - Given his good response overall, will plan to admit and continue with IV Lasix 60 mg twice daily. Follow  I's and O's along with daily weights. Will continue [place] 10mg  daily, Entresto 24-26mg  BID and Spironolactone 12.5mg  every other day. Will hold PTA Coreg 6.25mg  BID at this time given his acute decompensation and respiratory status.    2. CAD/Elevated Troponin Values - He is s/p stenting to LAD in 2012 with angioplasty to OM1 in 2012 as well. He denies any recent anginal symptoms. Initial Hs Troponin was elevated at 43 with repeat pending. If this remains flat, likely secondary to demand ischemia in the setting of his acute CHF exacerbation. If enzymes trend significantly upward, would need to hold Coumadin and bridge with Heparin in anticipation of further ischemic evaluation. - By review of the chart, his last ischemic evaluation as a NST in 2013. Even if he does not require ischemic evaluation this admission, would consider as an outpatient given the time frame since his last evaluation.  - Continue Atorvastatin 80 mg daily. He is not on ASA given the need for anticoagulation.  3. History of VT - He does have a Medtronic ICD in place. Continue to follow on telemetry.   4. LV Thrombus - Noted on prior echocardiogram. Will consult pharmacy to assist with Coumadin dosing.   5. Stage 3 CKD - Baseline creatinine 1.3 - 1.5. At 1.60 today. Continue to follow with diuresis.   For questions or updates, please contact [facility] Please consult [email or web address] for contact info under     Signed, Ellsworth Lennox, PA-C  10/15/2022 12:55 PM

## 2022-10-15 NOTE — ED Notes (Signed)
Gave pt a cup of ice water and call bell is within reach of pt at this time

## 2022-10-15 NOTE — ED Provider Notes (Signed)
[place] [facility] AT [facility] Provider Note   CSN: [id number] Arrival date & time: 10/15/22  1106     History  Chief Complaint  Patient presents with   cpap/SOB    Steven [name] is a 67 y.o. male.  67 year old male with prior medical history as detailed below presents for evaluation.  Patient reports acute onset shortness of breath.  Symptoms began approximately 2 hours prior to arrival.  Patient takes daily Lasix - 60 mg.  His last dose was yesterday.  He reports history of CHF.  He has not taken any Lasix today.  EMS noted room air up saturations approximate 70%.  Patient was placed on CPAP.  Nitro applied by EMS to the right anterior shoulder.  On arrival the patient is in moderate respiratory distress.  He is diaphoretic.  He is unable to provide significant history given his distress.  Saturations on CPAP are approximately 92%.  The history is provided by the patient and medical records.       Home Medications Prior to Admission medications   Medication Sig Start Date End Date Taking? Authorizing Provider  acetaminophen (TYLENOL) 500 MG tablet Take 1,000 mg by mouth every 6 (six) hours as needed for mild pain.    [provider]  atorvastatin (LIPITOR) 80 MG tablet Take 1 tablet (80 mg total) by mouth daily. 05/11/22   Azalee Course, PA  carvedilol (COREG) 6.25 MG tablet Take 1 tablet (6.25 mg total) by mouth 2 (two) times daily with a meal. 05/10/22   Azalee Course, PA  Cholecalciferol (VITAMIN D) 125 MCG (5000 UT) CAPS Take 5,000 Units by mouth daily.    [provider]  dapagliflozin propanediol (FARXIGA) 10 MG TABS tablet Take 10 mg by mouth daily.    [provider]  furosemide (LASIX) 20 MG tablet Take 3 tablets (60 mg total) by mouth daily. Take an additional 1 tablet (20 mg) in the evening for swelling as needed 10/12/22   Lennette Bihari, MD  Multiple Vitamin (MULTIVITAMIN) tablet Take 1 tablet by mouth daily.    [provider]  nitroGLYCERIN (NITROSTAT) 0.4 MG SL tablet Place 1 tablet (0.4 mg total) under the tongue every 5 (five) minutes as needed. Usual dose is q 5 minutes x 3 doses. Patient not taking: Reported on 10/12/2022 07/27/21   Lennette Bihari, MD  sacubitril-valsartan (ENTRESTO) 24-26 MG Take 1 tablet by mouth 2 (two) times daily. 05/17/22   Croitoru, Mihai, MD  spironolactone (ALDACTONE) 25 MG tablet Take 0.5 tablets (12.5 mg total) by mouth every other day. TAKE EOD ON EVEN DAYS 04/14/22   Lennette Bihari, MD  tamsulosin (FLOMAX) 0.4 MG CAPS capsule Take 0.4 mg by mouth at bedtime.    [provider]  warfarin (COUMADIN) 5 MG tablet Take 1 tablet daily except 1/2 tablet on Wednesdays and Fridays or as directed. Patient taking differently: Take 2.5-5 mg by mouth See admin instructions. Take 1 tablet daily except 1/2 tablet on Wednesdays and Fridays or as directed. 2.5 mg every Wed, Fri; 5 mg all other days 08/14/22   Lennette Bihari, MD  ZENPEP [phone] units CPEP Take 1 capsule by mouth 3 (three) times daily with meals. 08/09/22   [provider]      Allergies    Patient has no known allergies.    Review of Systems   Review of Systems  All other systems reviewed and are negative.   Physical Exam Updated  Vital Signs BP (!) 160/115 (BP Location: Left Arm)   Pulse (!) 111   Temp (!) 97.4 F (36.3 C) (Axillary)   Resp (!) 24   Ht  (1.753 m)   Wt 80.3 kg   SpO2 100%   BMI 26.14 kg/m  Physical Exam Vitals and nursing note reviewed.  Constitutional:      General: He is not in acute distress.    Appearance: He is well-developed.     Comments: Alert, in moderate respiratory distress, diaphoretic, significant increased work of breathing, Rales bilaterally.  HENT:     Head: Normocephalic and atraumatic.  Eyes:     Conjunctiva/sclera: Conjunctivae normal.     Pupils: Pupils are equal, round, and reactive to light.  Cardiovascular:     Rate and Rhythm:  Regular rhythm. Tachycardia present.     Heart sounds: Normal heart sounds.  Pulmonary:     Effort: Respiratory distress present.     Comments: Bilateral rales  Abdominal:     General: There is no distension.     Palpations: Abdomen is soft.     Tenderness: There is no abdominal tenderness.  Musculoskeletal:        General: No deformity. Normal range of motion.     Cervical back: Normal range of motion and neck supple.  Skin:    General: Skin is warm and dry.  Neurological:     General: No focal deficit present.     Mental Status: He is alert and oriented to person, place, and time.     ED Results / Procedures / Treatments   Labs (all labs ordered are listed, but only abnormal results are displayed) Labs Reviewed  I-STAT CHEM 8, ED - Abnormal; Notable for the following components:      Result Value   Potassium 3.4 (*)    Creatinine, Ser 1.50 (*)    Glucose, Bld 192 (*)    Calcium, Ion 1.08 (*)    TCO2 20 (*)    All other components within normal limits  I-STAT VENOUS BLOOD GAS, ED - Abnormal; Notable for the following components:   pCO2, Ven 42.3 (*)    pO2, Ven 31 (*)    Bicarbonate 19.2 (*)    TCO2 20 (*)    Acid-base deficit 8.0 (*)    Calcium, Ion 1.02 (*)    All other components within normal limits  CBC WITH DIFFERENTIAL/PLATELET  BRAIN NATRIURETIC PEPTIDE  BASIC METABOLIC PANEL  PROTIME-INR  TROPONIN I (HIGH SENSITIVITY)    EKG EKG Interpretation  Date/Time:  Sunday October 15 2022 11:14:53 EDT Ventricular Rate:  124 PR Interval:  170 QRS Duration: 118 QT Interval:  301 QTC Calculation: 433 R Axis:   -50 Text Interpretation: Sinus tachycardia Multiform ventricular premature complexes LAD, consider left anterior fascicular block Anterior infarct, old Abnormal T, consider ischemia, lateral leads Confirmed by Kristine Royal [phone]) on 10/15/2022 11:17:12 AM  Radiology No results found.  Procedures Procedures    Medications Ordered in ED Medications   nitroGLYCERIN 50 mg in dextrose 5 % 250 mL (0.2 mg/mL) infusion (5 mcg/min Intravenous New Bag/Given 10/15/22 1132)  furosemide (LASIX) 10 MG/ML injection (has no administration in time range)  furosemide (LASIX) injection 60 mg (has no administration in time range)    ED Course/ Medical Decision Making/ A&P                             Medical Decision  Making Amount and/or Complexity of Data Reviewed Labs: ordered. Radiology: ordered.  Risk Prescription drug management. Decision regarding hospitalization.    Medical Screen Complete  This patient presented to the ED with complaint of respiratory distress.  This complaint involves an extensive number of treatment options. The initial differential diagnosis includes, but is not limited to, pulmonary edema, CHF, ACS, metabolic abnormality, etc.  This presentation is: Acute, Chronic, Self-Limited, Previously Undiagnosed, Uncertain Prognosis, Complicated, Systemic Symptoms, and Threat to Life/Bodily Function  Patient reports acute onset shortness of breath within 2 hours of arrival.  Patient with significant respiratory distress on initial evaluation.  Patient diaphoretic with room air sats in the 70s with EMS.  Patient noted to be hypertensive and diaphoretic and in moderate respiratory distress on arrival.  Immediate placement on BiPAP and initiation of nitro drip reduced patient's blood pressure and improved patient's ventilation/oxygenation.  IV Lasix administered in ED.  Given patient's extensive cardiac history case discussed with cardiology for admission.  Additional history obtained:  Additional history obtained from EMS External records from outside sources obtained and reviewed including prior ED visits and prior Inpatient records.    Lab Tests:  I ordered and personally interpreted labs.  The pertinent results include: CBC, BMP, i-STAT venous gas, i-STAT Chem-8, INR, BNP, troponin   Imaging Studies ordered:  I  ordered imaging studies including chest x-ray I independently visualized and interpreted obtained imaging which showed likely pulmonary edema I agree with the radiologist interpretation.   Cardiac Monitoring:  The patient was maintained on a cardiac monitor.  I personally viewed and interpreted the cardiac monitor which showed an underlying rhythm of: Sinus tach with a NSR   Medicines ordered:  I ordered medication including nitroglycerin drip, Lasix IV for suspected pulmonary edema Reevaluation of the patient after these medicines showed that the patient: improved  Problem List / ED Course:  Shortness of breath, suspected pulmonary edema   Reevaluation:  After the interventions noted above, I reevaluated the patient and found that they have: improved  Disposition:  After consideration of the diagnostic results and the patients response to treatment, I feel that the patent would benefit from admission.   CRITICAL CARE Performed by: Wynetta Fines   Total critical care time: 30 minutes  Critical care time was exclusive of separately billable procedures and treating other patients.  Critical care was necessary to treat or prevent imminent or life-threatening deterioration.  Critical care was time spent personally by me on the following activities: development of treatment plan with patient and/or surrogate as well as nursing, discussions with consultants, evaluation of patient's response to treatment, examination of patient, obtaining history from patient or surrogate, ordering and performing treatments and interventions, ordering and review of laboratory studies, ordering and review of radiographic studies, pulse oximetry and re-evaluation of patient's condition.          Final Clinical Impression(s) / ED Diagnoses Final diagnoses:  Respiratory distress    Rx / DC Orders ED Discharge Orders     None         Wynetta Fines, MD 10/15/22 [phone]

## 2022-10-15 NOTE — ED Notes (Signed)
Order from MD Orson Aloe to titrate off nitro drip and administer IV Lasix due to BP.

## 2022-10-15 NOTE — ED Triage Notes (Signed)
Pt bib ems from home c/o SOB that started two hours ago. Pt states he takes Lasix and last dose was yesterday. Hx CHF. EMS noted pt 70% RA upon arrival and placed pt on CPAP 92%.  Pt has nitroglycerin paste on right upper shoulder anterior.  BP 150/92 HR 126 CO2 14 RR 40  18G Rt AC

## 2022-10-16 ENCOUNTER — Encounter (HOSPITAL_COMMUNITY): Payer: Self-pay | Admitting: Cardiology

## 2022-10-16 DIAGNOSIS — E876 Hypokalemia: Secondary | ICD-10-CM

## 2022-10-16 DIAGNOSIS — N1831 Chronic kidney disease, stage 3a: Secondary | ICD-10-CM

## 2022-10-16 DIAGNOSIS — R7989 Other specified abnormal findings of blood chemistry: Secondary | ICD-10-CM

## 2022-10-16 LAB — BASIC METABOLIC PANEL
Anion gap: 14 (ref 5–15)
BUN: 19 mg/dL (ref 8–23)
CO2: 21 mmol/L — ABNORMAL LOW (ref 22–32)
Calcium: 8.8 mg/dL — ABNORMAL LOW (ref 8.9–10.3)
Chloride: 104 mmol/L (ref 98–111)
Creatinine, Ser: 1.36 mg/dL — ABNORMAL HIGH (ref 0.61–1.24)
GFR, Estimated: 57 mL/min — ABNORMAL LOW (ref >60)
Glucose, Bld: 94 mg/dL (ref 70–99)
Potassium: 3.2 mmol/L — ABNORMAL LOW (ref 3.5–5.1)
Sodium: 139 mmol/L (ref 135–145)

## 2022-10-16 LAB — HIV ANTIBODY (ROUTINE TESTING W REFLEX): HIV Screen 4th Generation wRfx: NONREACTIVE

## 2022-10-16 LAB — PROTIME-INR
INR: 2.6 — ABNORMAL HIGH (ref 0.8–1.2)
Prothrombin Time: 27.4 seconds — ABNORMAL HIGH (ref 11.4–15.2)

## 2022-10-16 MED ORDER — WARFARIN SODIUM 2.5 MG PO TABS
2.5000 mg | ORAL_TABLET | Freq: Once | ORAL | Status: AC
  Administered 2022-10-16: 2.5 mg via ORAL
  Filled 2022-10-16: qty 1

## 2022-10-16 MED ORDER — POTASSIUM CHLORIDE CRYS ER 20 MEQ PO TBCR
40.0000 meq | EXTENDED_RELEASE_TABLET | ORAL | Status: AC
  Administered 2022-10-16 (×2): 40 meq via ORAL
  Filled 2022-10-16 (×2): qty 2

## 2022-10-16 NOTE — Progress Notes (Signed)
ANTICOAGULATION CONSULT NOTE  Pharmacy Consult for warfarin Indication: LV thrombus  No Known Allergies  Patient Measurements: Height:  (175.3 cm) Weight: 80.3 kg (177 lb) IBW/kg (Calculated) : 70.7  Vital Signs: Temp: 98.3 F (36.8 C) (04/22 0220) Temp Source: Oral (04/22 0220) BP: 104/51 (04/22 0700) Pulse Rate: 65 (04/22 0700)  Labs: Recent Labs    10/15/22 1110 10/15/22 1118 10/15/22 1403 10/16/22 0100  HGB 14.0 15.0  15.3  --   --   HCT 43.7 44.0  45.0  --   --   PLT 239  --   --   --   LABPROT 27.9*  --   --  27.4*  INR 2.6*  --   --  2.6*  CREATININE 1.60* 1.50*  --  1.36*  TROPONINIHS 43*  --  123*  --      Estimated Creatinine Clearance: 52.7 mL/min (A) (by C-G formula based on SCr of 1.36 mg/dL (H)).   Medical History: Past Medical History:  Diagnosis Date   Acute MI, anterior wall    AICD (automatic cardioverter/defibrillator) present    CAD (coronary artery disease)    2D ECHO, 07/13/2011 - EF <25%, LV moderatelty dilated, LA moderately dilatedLEXISCAN, 12/14/2011 - moderate-severe perfusion defect seen in the basal anteroseptal, mid anterior, apicacl anterior, apical, apical inferior, and apical lateral regions, post-stress EF 25%, new EKG changes from baseline abnormalities   Cancer    Prostate   CHF (congestive heart failure) 2012   Hypertension 08/08/2021   Inguinal hernia, left    Pneumonia    November 2023   Pre-diabetes     Assessment: 32 YOM presenting with dyspnea, 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  INR on admission is therapeutic at 2.6  PTA dosing per pt:   daily except 2.5mg  on WMF  INR this morning 2.6 (therapeutic)  Goal of Therapy:  INR 2-3 Monitor platelets by anticoagulation protocol: Yes   Plan:  Warfarin 2.5mg  PO x 1 today Daily INR, s/s bleeding  Eldridge Scot, PharmD Clinical Pharmacist 10/16/2022 8:08 AM

## 2022-10-16 NOTE — ED Notes (Signed)
Patient was given 2 cups of water. 

## 2022-10-16 NOTE — Progress Notes (Signed)
Noted bloody sputum in bedside emesis basin. Per patient, this began "yesterday morning".

## 2022-10-16 NOTE — ED Notes (Signed)
.ED TO INPATIENT HANDOFF REPORT  ED Nurse Name and Phone #: [phone]  S Name/Age/Gender Steven [name] 67 y.o. male Room/Bed: 003C/003C  Code Status   Code Status: Full Code  Home/SNF/Other Home Patient oriented to: self, place, time, and situation Is this baseline? Yes   Triage Complete: Triage complete  Chief Complaint Acute HFrEF (heart failure with reduced ejection fraction) [I50.21]  Triage Note Pt bib ems from home c/o SOB that started two hours ago. Pt states he takes Lasix and last dose was yesterday. Hx CHF. EMS noted pt 70% RA upon arrival and placed pt on CPAP 92%.  Pt has nitroglycerin paste on right upper shoulder anterior.  BP 150/92 HR 126 CO2 14 RR 40  18G Rt AC   Allergies No Known Allergies  Level of Care/Admitting Diagnosis ED Disposition     ED Disposition  Admit   Condition  --   Comment  Hospital Area: [facility] [[id number]]  Level of Care: Progressive [102]  Admit to Progressive based on following criteria: CARDIOVASCULAR & THORACIC of moderate stability with acute coronary syndrome symptoms/low risk myocardial infarction/hypertensive urgency/arrhythmias/heart failure potentially compromising stability and stable post cardiovascular intervention patients.  May admit patient to [facility] or [facility] if equivalent level of care is available:: No  Covid Evaluation: Asymptomatic - no recent exposure (last 10 days) testing not required  Diagnosis: Acute HFrEF (heart failure with reduced ejection fraction) [1610960]  Admitting Physician: Jodelle Red [[id number]]  Attending Physician: Jodelle Red [[id number]]  Certification:: I certify this patient will need inpatient services for at least 2 midnights  Estimated Length of Stay: 3          B Medical/Surgery History Past Medical History:  Diagnosis Date   Acute MI, anterior wall    AICD (automatic cardioverter/defibrillator) present    CAD (coronary artery  disease)    2D ECHO, 07/13/2011 - EF <25%, LV moderatelty dilated, LA moderately dilatedLEXISCAN, 12/14/2011 - moderate-severe perfusion defect seen in the basal anteroseptal, mid anterior, apicacl anterior, apical, apical inferior, and apical lateral regions, post-stress EF 25%, new EKG changes from baseline abnormalities   Cancer    Prostate   CHF (congestive heart failure) 2012   Hypertension 08/08/2021   Inguinal hernia, left    Pneumonia    November 2023   Pre-diabetes    Past Surgical History:  Procedure Laterality Date   CARDIAC CATHETERIZATION  11/25/2010   Predilation balloon-Apex monorail 2x72mm, Cutting balloon-2.25x60mm, resulting in a reduction of 100% stenosis down to less than 10%   CARDIAC CATHETERIZATION  07/12/2010   LAD stented with a 3.5x54mm bare-metal Veriflex stent resulting in a reduction of 100% lesion to 0%   CARDIAC DEFIBRILLATOR PLACEMENT  03/02/2011   Medtronic Protecta XT DR, model #[id number], serial #[id number]   COLONOSCOPY     GOLD SEED IMPLANT N/A 07/21/2021   Procedure: GOLD SEED IMPLANT;  Surgeon: Heloise Purpura, MD;  Location: [facility];  Service: Urology;  Laterality: N/A;  NEEDS 30 MIN   HERNIA REPAIR  2018   ICD GENERATOR CHANGEOUT N/A 02/24/2019   Procedure: ICD GENERATOR CHANGEOUT;  Surgeon: Thurmon Fair, MD;  Location: [facility] INVASIVE [facility];  Service: Cardiovascular;  Laterality: N/A;   PACEMAKER PLACEMENT  07/14/2010   Temporary placement of pacemaker, if rhythm issue continues will need a permanent device   SPACE OAR INSTILLATION N/A 07/21/2021   Procedure: SPACE OAR INSTILLATION;  Surgeon: Heloise Purpura, MD;  Location: [facility];  Service: Urology;  Laterality: N/A;  A IV Location/Drains/Wounds Patient Lines/Drains/Airways Status     Active Line/Drains/Airways     Name Placement date Placement time Site Days   Peripheral IV 10/15/22 18 G Right Antecubital 10/15/22  1121  Antecubital  1   Peripheral IV 10/15/22 20 G 1"  Anterior;Left;Proximal Forearm 10/15/22  1141  Forearm  1            Intake/Output Last 24 hours  Intake/Output Summary (Last 24 hours) at 10/16/2022 1417 Last data filed at 10/16/2022 0359 Gross per 24 hour  Intake --  Output 3025 ml  Net -3025 ml    Labs/Imaging Results for orders placed or performed during the hospital encounter of 10/15/22 (from the past 48 hour(s))  CBC with Differential     Status: Abnormal   Collection Time: 10/15/22 11:10 AM  Result Value Ref Range   WBC 7.0 4.0 - 10.5 K/uL   RBC 4.56 4.22 - 5.81 MIL/uL   Hemoglobin 14.0 13.0 - 17.0 g/dL   HCT 16.1 09.6 - 04.5 %   MCV 95.8 80.0 - 100.0 fL   MCH 30.7 26.0 - 34.0 pg   MCHC 32.0 30.0 - 36.0 g/dL   RDW 40.9 81.1 - 91.4 %   Platelets 239 150 - 400 K/uL   nRBC 0.0 0.0 - 0.2 %   Neutrophils Relative % 32 %   Neutro Abs 2.2 1.7 - 7.7 K/uL   Lymphocytes Relative 39 %   Lymphs Abs 2.8 0.7 - 4.0 K/uL   Monocytes Relative 15 %   Monocytes Absolute 1.0 0.1 - 1.0 K/uL   Eosinophils Relative 13 %   Eosinophils Absolute 0.9 (H) 0.0 - 0.5 K/uL   Basophils Relative 1 %   Basophils Absolute 0.1 0.0 - 0.1 K/uL   Immature Granulocytes 0 %   Abs Immature Granulocytes 0.01 0.00 - 0.07 K/uL    Comment: Performed at [facility], [address] [place], [place] [address]  Troponin I (High Sensitivity)     Status: Abnormal   Collection Time: 10/15/22 11:10 AM  Result Value Ref Range   Troponin I (High Sensitivity) 43 (H) <18 ng/L    Comment: (NOTE) Elevated high sensitivity troponin I (hsTnI) values and significant  changes across serial measurements may suggest ACS but many other  chronic and acute conditions are known to elevate hsTnI results.  Refer to the "Links" section for chest pain algorithms and additional  guidance. Performed at [facility], [address] [place], [place] [address]   Brain natriuretic peptide     Status: Abnormal   Collection Time: 10/15/22 11:10 AM  Result Value Ref  Range   B Natriuretic Peptide 2,147.0 (H) 0.0 - 100.0 pg/mL    Comment: Performed at [facility], [address] [place], [place] [address]  Basic metabolic panel     Status: Abnormal   Collection Time: 10/15/22 11:10 AM  Result Value Ref Range   Sodium 138 135 - 145 mmol/L   Potassium 3.3 (L) 3.5 - 5.1 mmol/L   Chloride 108 98 - 111 mmol/L   CO2 20 (L) 22 - 32 mmol/L   Glucose, Bld 190 (H) 70 - 99 mg/dL    Comment: Glucose reference range applies only to samples taken after fasting for at least 8 hours.   BUN 19 8 - 23 mg/dL   Creatinine, Ser 4.69 (H) 0.61 - 1.24 mg/dL   Calcium 8.3 (L) 8.9 - 10.3 mg/dL   GFR, Estimated 47 (  L) >60 mL/min    Comment: (NOTE) Calculated using the CKD-EPI Creatinine Equation (2021)    Anion gap 10 5 - 15    Comment: Performed at [facility], [address], [place], [place] [address]  Protime-INR     Status: Abnormal   Collection Time: 10/15/22 11:10 AM  Result Value Ref Range   Prothrombin Time 27.9 (H) 11.4 - 15.2 seconds   INR 2.6 (H) 0.8 - 1.2    Comment: (NOTE) INR goal varies based on device and disease states. Performed at [facility], [address], [place], [place] [address]   I-stat chem 8, ED     Status: Abnormal   Collection Time: 10/15/22 11:18 AM  Result Value Ref Range   Sodium 142 135 - 145 mmol/L   Potassium 3.4 (L) 3.5 - 5.1 mmol/L   Chloride 110 98 - 111 mmol/L   BUN 22 8 - 23 mg/dL   Creatinine, Ser 0.98 (H) 0.61 - 1.24 mg/dL   Glucose, Bld 119 (H) 70 - 99 mg/dL    Comment: Glucose reference range applies only to samples taken after fasting for at least 8 hours.   Calcium, Ion 1.08 (L) 1.15 - 1.40 mmol/L   TCO2 20 (L) 22 - 32 mmol/L   Hemoglobin 15.3 13.0 - 17.0 g/dL   HCT 14.7 82.9 - 56.2 %  I-Stat venous blood gas, ED     Status: Abnormal   Collection Time: 10/15/22 11:18 AM  Result Value Ref Range   pH, Ven 7.264 7.25 - 7.43   pCO2, Ven 42.3 (L) 44 - 60 mmHg   pO2, Ven 31 (LL) 32 - 45 mmHg    Bicarbonate 19.2 (L) 20.0 - 28.0 mmol/L   TCO2 20 (L) 22 - 32 mmol/L   O2 Saturation 51 %   Acid-base deficit 8.0 (H) 0.0 - 2.0 mmol/L   Sodium 142 135 - 145 mmol/L   Potassium 3.6 3.5 - 5.1 mmol/L   Calcium, Ion 1.02 (L) 1.15 - 1.40 mmol/L   HCT 44.0 39.0 - 52.0 %   Hemoglobin 15.0 13.0 - 17.0 g/dL   Sample type VENOUS    Comment NOTIFIED PHYSICIAN   Troponin I (High Sensitivity)     Status: Abnormal   Collection Time: 10/15/22  2:03 PM  Result Value Ref Range   Troponin I (High Sensitivity) 123 (HH) <18 ng/L    Comment: CRITICAL RESULT CALLED TO, READ BACK BY AND VERIFIED WITH R,BAXTER RN  10/15/22 E,BENTON (NOTE) Elevated high sensitivity troponin I (hsTnI) values and significant  changes across serial measurements may suggest ACS but many other  chronic and acute conditions are known to elevate hsTnI results.  Refer to the "Links" section for chest pain algorithms and additional  guidance. Performed at [facility], [address], [place], [place] [address]   HIV Antibody (routine testing w rflx)     Status: None   Collection Time: 10/16/22  1:00 AM  Result Value Ref Range   HIV Screen 4th Generation wRfx Non Reactive Non Reactive    Comment: Performed at [facility], [address] [place], [place] [address]  Basic metabolic panel     Status: Abnormal   Collection Time: 10/16/22  1:00 AM  Result Value Ref Range   Sodium 139 135 - 145 mmol/L   Potassium 3.2 (L) 3.5 - 5.1 mmol/L   Chloride 104 98 - 111 mmol/L   CO2 21 (L) 22 -  32 mmol/L   Glucose, Bld 94 70 - 99 mg/dL    Comment: Glucose reference range applies only to samples taken after fasting for at least 8 hours.   BUN 19 8 - 23 mg/dL   Creatinine, Ser 4.09 (H) 0.61 - 1.24 mg/dL   Calcium 8.8 (L) 8.9 - 10.3 mg/dL   GFR, Estimated 57 (L) >60 mL/min    Comment: (NOTE) Calculated using the CKD-EPI Creatinine Equation (2021)    Anion gap 14 5 - 15    Comment: Performed at [facility]  Lab, [address], [place], [place] [address]  Protime-INR     Status: Abnormal   Collection Time: 10/16/22  1:00 AM  Result Value Ref Range   Prothrombin Time 27.4 (H) 11.4 - 15.2 seconds   INR 2.6 (H) 0.8 - 1.2    Comment: (NOTE) INR goal varies based on device and disease states. Performed at [facility], [address] [place], [place] [address]    DG Chest Port 1 View  Result Date: 10/15/2022 CLINICAL DATA:  Shortness of breath. EXAM: PORTABLE CHEST 1 VIEW COMPARISON:  Two-view chest x-ray 05/09/2022 FINDINGS: Heart is mildly enlarged. Pericardial calcifications are again noted. Pacing wires are stable. Diffuse interstitial and airspace opacities are present, left greater than right. A left pleural effusion is present. IMPRESSION: 1. Diffuse interstitial and airspace opacities, left greater than right. This is concerning for edema or infection. 2. Left pleural effusion. Electronically Signed   By: Marin Roberts M.D.   On: 10/15/2022 11:52    Pending Labs Unresulted Labs (From admission, onward)     Start     Ordered   10/16/22 0500  Basic metabolic panel  Daily,   R     Comments: As Scheduled for 5 days    10/15/22 1641   10/16/22 0500  Protime-INR  Daily,   R      10/15/22 1639            Vitals/Pain Today's Vitals   10/16/22 1045 10/16/22 1100 10/16/22 1230 10/16/22 1245  BP: 99/61 100/66 (!) 109/46   Pulse: 72 72 76 76  Resp: (!) 23 (!) 22 20   Temp:      TempSrc:      SpO2: 100% 100% 100% 100%  Weight:      Height:      PainSc:        Isolation Precautions No active isolations  Medications Medications  nitroGLYCERIN 50 mg in dextrose 5 % 250 mL (0.2 mg/mL) infusion (0 mcg/min Intravenous Stopped 10/16/22 0753)  furosemide (LASIX) 10 MG/ML injection (  Not Given 10/15/22 1249)  atorvastatin (LIPITOR) tablet 80 mg (80 mg Oral Given 10/16/22 1106)  sacubitril-valsartan (ENTRESTO) 24-26 mg per tablet (1 tablet Oral Given 10/16/22 1106)   spironolactone (ALDACTONE) tablet 12.5 mg (12.5 mg Oral Given 10/16/22 1106)  dapagliflozin propanediol (FARXIGA) tablet 10 mg (10 mg Oral Given 10/16/22 1106)  tamsulosin (FLOMAX) capsule 0.4 mg (0.4 mg Oral Given 10/15/22 2157)  sodium chloride flush (NS) 0.9 % injection 3 mL (3 mLs Intravenous Given 10/16/22 1031)  sodium chloride flush (NS) 0.9 % injection 3 mL (has no administration in time range)  0.9 %  sodium chloride infusion (has no administration in time range)  acetaminophen (TYLENOL) tablet 650 mg (has no administration in time range)  ondansetron (ZOFRAN) injection 4 mg (has no administration in time range)  furosemide (LASIX) injection 60 mg (60 mg Intravenous Given 10/16/22 1111)  Warfarin - Pharmacist Dosing Inpatient (has no administration in time range)  lipase/protease/amylase (CREON) capsule 36,000 Units (36,000 Units Oral Given 10/16/22 1207)  warfarin (COUMADIN) tablet 2.5 mg (has no administration in time range)  potassium chloride SA (KLOR-CON M) CR tablet 40 mEq (40 mEq Oral Given 10/16/22 1111)  furosemide (LASIX) injection 60 mg (60 mg Intravenous Given 10/15/22 1153)  warfarin (COUMADIN) tablet 5 mg (5 mg Oral Given 10/15/22 1810)    Mobility walks     Focused Assessments Cardiac Assessment Handoff:  Cardiac Rhythm: Normal sinus rhythm Lab Results  Component Value Date   CKTOTAL 299 (H) 11/24/2010   CKMB (HH) 11/24/2010    9.3 CRITICAL VALUE NOTED.  VALUE IS CONSISTENT WITH PREVIOUSLY REPORTED AND CALLED VALUE.   TROPONINI (HH) 11/24/2010    2.03        Due to the release kinetics of cTnI, a negative result within the first hours of the onset of symptoms does not rule out myocardial infarction with certainty. If myocardial infarction is still suspected, repeat the test at appropriate intervals. **Please note change in reference range.** CRITICAL VALUE NOTED.  VALUE IS CONSISTENT WITH PREVIOUSLY REPORTED AND CALLED VALUE.   No results found for:  "DDIMER" Does the Patient currently have chest pain? No   , Neuro Assessment Handoff:  Swallow screen pass? Yes  Cardiac Rhythm: Normal sinus rhythm       Neuro Assessment: Within Defined Limits Neuro Checks:      Has TPA been given? No If patient is a Neuro Trauma and patient is going to OR before floor call report to 4N Charge nurse: [phone] or [phone]   R Recommendations: See Admitting Provider Note  Report given to:   Additional Notes: NA

## 2022-10-16 NOTE — Progress Notes (Signed)
   Heart Failure Stewardship Pharmacist Progress Note   PCP: Ziglar, Eli Phillips, MD PCP-Cardiologist: Nicki Guadalajara, MD    HPI:  67 yo M with PMH of HFrEF, CAD, VT, LV thrombus, HTN, HLD, and CKD III.   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  He presented after a three-hour delay to the hospital and was taken emergently for cath with DES to  LAD.    Followed by Dr Tresa Endo and Dr Royann Shivers for many years.   Admitted in 04/2022 with acute CHF. ECHO showed LVEF <20% and RV moderately reduced. Diuresed with IV lasix and placed on GDMT. Followed up with HF TOC clinic on 05/22/22 and was doing well. Unable to take full dose spironolactone due to chest discomfort. Discussed possible f/u with Advanced Heart Failure and suggested CPX for risk stratification however he would like to discuss with Dr Tresa Endo.   Followed up with Dr Tresa Endo in 05/2022 and 10/12/22. He was euvolemic on his exam.   Presented to the ED on 4/21 with shortness of breath and LE edema. CXR with diffuse interstitial and airspace opacities concerning for edema and left pleural effusion.   Current HF Medications: Diuretic: furosemide 60 mg IV BID ACE/ARB/ARNI: Entresto 24/26 mg BID MRA: spironolactone 12.5 mg daily SGLT2i: Farxiga 10 mg daily  Prior to admission HF Medications: Diuretic: furosemide 60 mg daily Beta blocker: carvedilol 6.25 mg BID ACE/ARB/ARNI: Entresto 24/26 mg BID MRA: spironolactone 12.5 mg daily SGLT2i: Farxiga 10 mg daily  Pertinent Lab Values: Serum creatinine 1.36, BUN 19, Potassium 3.2, Sodium 139, BNP 2147  Vital Signs: Weight: 177 lbs (admission weight: 177 lbs) Blood pressure: 100/60s  Heart rate: 70s  I/O: -3.5L yesterday; net -4L  Medication Assistance / Insurance Benefits Check: Does the patient have prescription insurance?  Yes Type of insurance plan: [employer]  Outpatient Pharmacy:  Prior to admission outpatient  pharmacy: [facility] Is the patient willing to use [facility] TOC pharmacy at discharge? Yes Is the patient willing to transition their outpatient pharmacy to utilize a [facility] outpatient pharmacy?   Pending    Assessment: 1. Acute on chronic systolic CHF (LVEF <20%). NYHA class III symptoms. - Continue furosemide 60 mg IV BID. Strict I/Os and daily weights. Keep K>4 and Mg>2. KCl 80 mEq x 1 given for replacement. Agree with adding stockings today. - BP borderline, holding carvedilol - Continue Entresto 24/26 mg BID - Continue spironolactone 12.5 mg daily, cannot tolerate full dose - Continue Farxiga 10 mg daily   Plan: 1) Medication changes recommended at this time: - Continue IV diuresis  2) Patient assistance: - None pending  3)  Education  - To be completed prior to discharge  Sharen Hones, PharmD, BCPS Heart Failure Engineer, building services Phone [phone]

## 2022-10-16 NOTE — Progress Notes (Signed)
Rounding Note    Patient Name: Steven [name] Date of Encounter: 10/16/2022  [facility] Cardiologist: Nicki Guadalajara, MD   Subjective   No acute events overnight. Breathing is "100% better" compared to yesterday but not back at his baseline. Does not wear home O2 but requiring nasal cannula currently. Notes that his blood pressures always run towards the low side, but he is asymptomatic. Asking about the creon--he does not take this at home.  Inpatient Medications    Scheduled Meds:  atorvastatin  80 mg Oral Daily   dapagliflozin propanediol  10 mg Oral Daily   furosemide  60 mg Intravenous BID   lipase/protease/amylase  36,000 Units Oral TID WC   potassium chloride  40 mEq Oral Q4H   sacubitril-valsartan  1 tablet Oral BID   sodium chloride flush  3 mL Intravenous Q12H   spironolactone  12.5 mg Oral QODAY   tamsulosin  0.4 mg Oral QHS   warfarin  2.5 mg Oral ONCE-1600   Warfarin - Pharmacist Dosing Inpatient   Does not apply q1600   Continuous Infusions:  sodium chloride     nitroGLYCERIN Stopped (10/15/22 2111)   PRN Meds: sodium chloride, acetaminophen, ondansetron (ZOFRAN) IV, sodium chloride flush   Vital Signs    Vitals:   10/16/22 0915 10/16/22 1030 10/16/22 1045 10/16/22 1100  BP: 100/66  Pulse: 73 72 72 72  Resp: (!) 23 (!) 22 (!) 23 (!) 22  Temp:      TempSrc:      SpO2: 100% 99% 100% 100%  Weight:      Height:        Intake/Output Summary (Last 24 hours) at 10/16/2022 1321 Last data filed at 10/16/2022 0359 Gross per 24 hour  Intake --  Output 3625 ml  Net -3625 ml      10/15/2022   11:30 AM 10/12/2022    9:45 AM 07/10/2022    8:43 AM  Last 3 Weights  Weight (lbs) 177 lb 174 lb 166 lb  Weight (kg) 80.287 kg 78.926 kg 75.297 kg      Telemetry    SR with occasional PVCs - Personally Reviewed  ECG    No new - Personally Reviewed  Physical Exam   GEN: No acute distress.   Neck: JVD low neck at 60  degrees Cardiac: RRR, no murmurs, rubs, or gallops.  Respiratory: L basilar rales GI: Soft, nontender, non-distended  MS: bilateral 1+ pitting edema; No deformity. Neuro:  Nonfocal  Psych: Normal affect   Labs    High Sensitivity Troponin:   Recent Labs  Lab 10/15/22 1110 10/15/22 1403  TROPONINIHS 43* 123*     Chemistry Recent Labs  Lab 10/15/22 1110 10/15/22 1118 10/16/22 0100  NA 138 142  142 139  K 3.3* 3.6  3.4* 3.2*  CL 108 110 104  CO2 20*  --  21*  GLUCOSE 190* 192* 94  BUN CREATININE 1.60* 1.50* 1.36*  CALCIUM 8.3*  --  8.8*  GFRNONAA 47*  --  57*  ANIONGAP 10  --  14    Lipids No results for input(s): "CHOL", "TRIG", "HDL", "LABVLDL", "LDLCALC", "CHOLHDL" in the last 168 hours.  Hematology Recent Labs  Lab 10/15/22 1110 10/15/22 1118  WBC 7.0  --   RBC 4.56  --   HGB 14.0 15.0  15.3  HCT 43.7 44.0  45.0  MCV 95.8  --   MCH 30.7  --  MCHC 32.0  --   RDW 15.3  --   PLT 239  --    Thyroid No results for input(s): "TSH", "FREET4" in the last 168 hours.  BNP Recent Labs  Lab 10/15/22 1110  BNP 2,147.0*    DDimer No results for input(s): "DDIMER" in the last 168 hours.   Radiology    DG Chest Port 1 View  Result Date: 10/15/2022 CLINICAL DATA:  Shortness of breath. EXAM: PORTABLE CHEST 1 VIEW COMPARISON:  Two-view chest x-ray 05/09/2022 FINDINGS: Heart is mildly enlarged. Pericardial calcifications are again noted. Pacing wires are stable. Diffuse interstitial and airspace opacities are present, left greater than right. A left pleural effusion is present. IMPRESSION: 1. Diffuse interstitial and airspace opacities, left greater than right. This is concerning for edema or infection. 2. Left pleural effusion. Electronically Signed   By: Marin Roberts M.D.   On: 10/15/2022 11:52    Cardiac Studies   Cardiac Studies & Procedures     STRESS TESTS  NM MYOCAR MULTI W/SPECT W 12/09/2012   ECHOCARDIOGRAM  ECHOCARDIOGRAM  COMPLETE 05/09/2022  Narrative ECHOCARDIOGRAM REPORT    Patient Name:   Steven [name] [facility] Date of Exam: 05/09/2022 Medical Rec #:  [id number]    Height:       69.0 in Accession #:    [id number]   Weight:       168.3 lb Date of Birth:  12-29-1955    BSA:          1.920 m Patient Age:    66 years     BP:           100/72 mmHg Patient Gender: M            HR:           64 bpm. Exam Location:  Inpatient  Procedure: 2D Echo, Cardiac Doppler, Color Doppler and Intracardiac Opacification Agent  Indications:    CHF-Acute Systolic I50.21  History:        Patient has prior history of Echocardiogram examinations, most recent 01/05/2020. CHF and Cardiomyopathy, CAD and Previous Myocardial Infarction, Arrythmias:Bradycardia, Signs/Symptoms:Shortness of Breath; Risk Factors:Dyslipidemia, Hypertension and Former Smoker.  Sonographer:    Aron Baba Referring Phys: 26 BRIAN S CRENSHAW   Sonographer Comments: Image acquisition challenging due to patient body habitus and Image acquisition challenging due to respiratory motion. IMPRESSIONS   1. On contrast images, there is an adherent mass at the inferoseptal apical portion that is consistent with laminated/chronic thrombus. No mobile aspects seen and not appreciated in other views. Left ventricular ejection fraction, by estimation, is <20%. The left ventricle has severely decreased function. The left ventricle demonstrates global hypokinesis. The left ventricular internal cavity size was moderately dilated. Left ventricular diastolic parameters are indeterminate. 2. Right ventricular systolic function is moderately reduced. The right ventricular size is normal. There is mildly elevated pulmonary artery systolic pressure. 3. Left atrial size was mild to moderately dilated. 4. Right atrial size was mildly dilated. 5. The mitral valve is normal in structure. Trivial mitral valve regurgitation. No evidence of mitral stenosis. 6. The aortic valve is  grossly normal. Aortic valve regurgitation is not visualized. No aortic stenosis is present. 7. The inferior vena cava is dilated in size with <50% respiratory variability, suggesting right atrial pressure of 15 mmHg.  Comparison(s): Changes from prior study are noted. Prior EF 20-25%, now <20%.  Conclusion(s)/Recommendation(s): Adherent LV thrombus seen at inferoseptal apical segment. While patient has known prior LV thrombus, I did not  see these on echo contrast images from 2021 study. Findings communicated to Dr. Jens Som.  FINDINGS Left Ventricle: On contrast images, there is an adherent mass at the inferoseptal apical portion that is consistent with laminated/chronic thrombus. No mobile aspects seen and not appreciated in other views. Left ventricular ejection fraction, by estimation, is <20%. The left ventricle has severely decreased function. The left ventricle demonstrates global hypokinesis. Definity contrast agent was given IV to delineate the left ventricular endocardial borders. The left ventricular internal cavity size was moderately dilated. There is no left ventricular hypertrophy. Left ventricular diastolic parameters are indeterminate.  Right Ventricle: The right ventricular size is normal. Right vetricular wall thickness was not well visualized. Right ventricular systolic function is moderately reduced. There is mildly elevated pulmonary artery systolic pressure. The tricuspid regurgitant velocity is 2.59 m/s, and with an assumed right atrial pressure of 15 mmHg, the estimated right ventricular systolic pressure is 41.8 mmHg.  Left Atrium: Left atrial size was mild to moderately dilated.  Right Atrium: Right atrial size was mildly dilated.  Pericardium: There is no evidence of pericardial effusion.  Mitral Valve: The mitral valve is normal in structure. Trivial mitral valve regurgitation. No evidence of mitral valve stenosis.  Tricuspid Valve: The tricuspid valve is grossly  normal. Tricuspid valve regurgitation is trivial. No evidence of tricuspid stenosis.  Aortic Valve: The aortic valve is grossly normal. Aortic valve regurgitation is not visualized. No aortic stenosis is present.  Pulmonic Valve: The pulmonic valve was not well visualized. Pulmonic valve regurgitation is not visualized. No evidence of pulmonic stenosis.  Aorta: The aortic root and ascending aorta are structurally normal, with no evidence of dilitation.  Venous: The inferior vena cava is dilated in size with less than 50% respiratory variability, suggesting right atrial pressure of 15 mmHg.  IAS/Shunts: The interatrial septum was not well visualized.  Additional Comments: A device lead is visualized.   LEFT VENTRICLE PLAX 2D LVIDd:         5.90 cm   Diastology LVIDs:         5.70 cm   LV e' medial:    5.43 cm/s LV PW:         1.00 cm   LV E/e' medial:  13.4 LV IVS:        0.30 cm   LV e' lateral:   12.20 cm/s LVOT diam:     1.90 cm   LV E/e' lateral: 5.9 LV SV:         54 LV SV Index:   28 LVOT Area:     2.84 cm   RIGHT VENTRICLE RV S prime:     6.81 cm/s TAPSE (M-mode): 0.9 cm  LEFT ATRIUM             Index        RIGHT ATRIUM           Index LA diam:        3.40 cm 1.77 cm/m   RA Area:     17.80 cm LA Vol (A2C):   70.8 ml 36.88 ml/m  RA Volume:   43.30 ml  22.55 ml/m LA Vol (A4C):   74.2 ml 38.65 ml/m LA Biplane Vol: 76.6 ml 39.90 ml/m AORTIC VALVE LVOT Vmax:   94.60 cm/s LVOT Vmean:  66.200 cm/s LVOT VTI:    0.191 m  AORTA Ao Root diam: 3.30 cm Ao Asc diam:  3.50 cm  MITRAL VALVE  TRICUSPID VALVE MV Area (PHT): 4.24 cm    TR Peak grad:   26.8 mmHg MV Decel Time: 179 msec    TR Vmax:        259.00 cm/s MV E velocity: 72.50 cm/s MV A velocity: 47.10 cm/s  SHUNTS MV E/A ratio:  1.54        Systemic VTI:  0.19 m Systemic Diam: 1.90 cm  Jodelle Red MD Electronically signed by Jodelle Red MD Signature Date/Time:  05/09/2022/6:12:15 PM    Final              Patient Profile     67 y.o. male with PMH chronic systolic heart failure, CAD with prior PCI, VT with ICD, history of LV thrombus, last appearing laminated on echo, hypertension, HLD, chronic kidney disease stage 3a whom we are asked to see for acute on chronic heart failure and acute hypoxic respiratory failure.   Assessment & Plan    Acute on chronic systolic and diastolic heart failure Acute hypoxic respiratory failure Chronic kidney disease stage 3a -weaned from bipap to nasal cannula. As he diureses, wean to room air -continue diuresis -admission weight 80.3 kg, no additional weights -charted as -4L, suspect even greater than this given patient report -continue entresto, farxiga, spironolactone. BP borderline for carvedilol, will continue to hold -hypokalemia to 3.2, repleting with 80 meq today -Cr 1.36 today, down from 1.5 -adding compression stockings today -encourage ambulation   CAD Elevated troponins -no chest pain, elevated troponins most likely 2/2 demand from acute heart failure -continue atorvastatin -no aspirin as he is on coumadin   History of VT -none seen thus far -ICD in place -monitor electrolytes with diuresis, as above repleting K   History of LV thrombus -on coumadin, appreciate pharmacy assistance in dosing -INR 2.6 today  For questions or updates, please contact [facility] Please consult [email or web address] for contact info under        Signed, Jodelle Red, MD  10/16/2022, 1:21 PM

## 2022-10-16 NOTE — Progress Notes (Signed)
Patient resting comfortably this AM, on [place], with no respiratory distress noted.  Bipap currently not indicated.  Will continue to monitor and assess for bipap needs.

## 2022-10-17 LAB — BASIC METABOLIC PANEL
Anion gap: 12 (ref 5–15)
BUN: 21 mg/dL (ref 8–23)
CO2: 22 mmol/L (ref 22–32)
Calcium: 8.6 mg/dL — ABNORMAL LOW (ref 8.9–10.3)
Chloride: 102 mmol/L (ref 98–111)
Creatinine, Ser: 1.29 mg/dL — ABNORMAL HIGH (ref 0.61–1.24)
GFR, Estimated: >60 mL/min (ref >60)
Glucose, Bld: 108 mg/dL — ABNORMAL HIGH (ref 70–99)
Potassium: 3.8 mmol/L (ref 3.5–5.1)
Sodium: 136 mmol/L (ref 135–145)

## 2022-10-17 LAB — MAGNESIUM: Magnesium: 2 mg/dL (ref 1.7–2.4)

## 2022-10-17 LAB — PROTIME-INR
INR: 2.7 — ABNORMAL HIGH (ref 0.8–1.2)
Prothrombin Time: 28.4 seconds — ABNORMAL HIGH (ref 11.4–15.2)

## 2022-10-17 MED ORDER — WARFARIN SODIUM 2.5 MG PO TABS: 2.5000 mg | ORAL_TABLET | ORAL | Status: DC

## 2022-10-17 MED ORDER — WARFARIN SODIUM 5 MG PO TABS
5.0000 mg | ORAL_TABLET | ORAL | Status: DC
  Administered 2022-10-17: 5 mg via ORAL
  Filled 2022-10-17: qty 1

## 2022-10-17 MED ORDER — METOPROLOL SUCCINATE ER 25 MG PO TB24
25.0000 mg | ORAL_TABLET | Freq: Every day | ORAL | Status: DC
  Administered 2022-10-17 – 2022-10-18 (×2): 25 mg via ORAL
  Filled 2022-10-17 (×2): qty 1

## 2022-10-17 MED ORDER — POTASSIUM CHLORIDE CRYS ER 20 MEQ PO TBCR
40.0000 meq | EXTENDED_RELEASE_TABLET | Freq: Once | ORAL | Status: AC
  Administered 2022-10-17: 40 meq via ORAL
  Filled 2022-10-17: qty 2

## 2022-10-17 NOTE — Progress Notes (Signed)
Rounding Note    Patient Name: Steven [name] Date of Encounter: 10/17/2022  [facility] Cardiologist: Nicki Guadalajara, MD   Subjective   Continues to improve. Now on room air at rest. Wearing compression stockings today. Has not ambulated. Breathing better, not yet at baseline. Asking how to prevent readmission. Spoke frankly about heart failure, optimizing management, but also natural history.  Inpatient Medications    Scheduled Meds:  atorvastatin  80 mg Oral Daily   dapagliflozin propanediol  10 mg Oral Daily   furosemide  60 mg Intravenous BID   lipase/protease/amylase  36,000 Units Oral TID WC   metoprolol succinate  25 mg Oral Daily   potassium chloride  40 mEq Oral Once   sacubitril-valsartan  1 tablet Oral BID   sodium chloride flush  3 mL Intravenous Q12H   spironolactone  12.5 mg Oral QODAY   tamsulosin  0.4 mg Oral QHS   [START ON 10/18/2022] warfarin  2.5 mg Oral Once per day on Mon Wed Fri   And   warfarin  5 mg Oral Once per day on Sun Tue Thu Sat   Warfarin - Pharmacist Dosing Inpatient   Does not apply q1600   Continuous Infusions:  sodium chloride     PRN Meds: sodium chloride, acetaminophen, ondansetron (ZOFRAN) IV, sodium chloride flush   Vital Signs    Vitals:   10/17/22 0043 10/17/22 0351 10/17/22 0829 10/17/22 0946  BP:  97/62  99/66  Pulse:  84  96  Resp: 20 20    Temp:  98 F (36.7 C)    TempSrc:  Oral    SpO2:  92%    Weight:   71.3 kg   Height:        Intake/Output Summary (Last 24 hours) at 10/17/2022 1051 Last data filed at 10/17/2022 0352 Gross per 24 hour  Intake 783 ml  Output 1915 ml  Net -1132 ml      10/17/2022    8:29 AM 10/17/2022   12:17 AM 10/16/2022    4:00 PM  Last 3 Weights  Weight (lbs) 157 lb 3 oz 157 lb 3 oz 159 lb 9.8 oz  Weight (kg) 71.3 kg 71.3 kg 72.4 kg      Telemetry    SR with occasional PVCs and 15 second run of ectopy this AM- Personally Reviewed  ECG    No new - Personally  Reviewed  Physical Exam   GEN: Well nourished, well developed in no acute distress NECK: JVD low neck at 45 degrees CARDIAC: regular rhythm, normal S1 and S2, no rubs or gallops. No murmur. VASCULAR: Radial pulses 2+ bilaterally.  RESPIRATORY:  Improving breath sounds L base ABDOMEN: Soft, non-tender, non-distended MUSCULOSKELETAL:  Moves all 4 limbs independently SKIN: Warm and dry, compression stockings on bilateral LE with trace pitting edema NEUROLOGIC:  No focal neuro deficits noted. PSYCHIATRIC:  Normal affect    Labs    High Sensitivity Troponin:   Recent Labs  Lab 10/15/22 1110 10/15/22 1403  TROPONINIHS 43* 123*     Chemistry Recent Labs  Lab 10/15/22 1110 10/15/22 1118 10/16/22 0100 10/17/22 0045  NA 138 142  142 139 136  K 3.3* 3.6  3.4* 3.2* 3.8  CL 108 110 104 102  CO2 20*  --  21* 22  GLUCOSE 190* 192* 94 108*  BUN 19 22 19 21   CREATININE 1.60* 1.50* 1.36* 1.29*  CALCIUM 8.3*  --  8.8* 8.6*  MG  --   --   --  2.0  GFRNONAA 47*  --  57* >60  ANIONGAP 10  --  14 12    Lipids No results for input(s): "CHOL", "TRIG", "HDL", "LABVLDL", "LDLCALC", "CHOLHDL" in the last 168 hours.  Hematology Recent Labs  Lab 10/15/22 1110 10/15/22 1118  WBC 7.0  --   RBC 4.56  --   HGB 14.0 15.0  15.3  HCT 43.7 44.0  45.0  MCV 95.8  --   MCH 30.7  --   MCHC 32.0  --   RDW 15.3  --   PLT 239  --    Thyroid No results for input(s): "TSH", "FREET4" in the last 168 hours.  BNP Recent Labs  Lab 10/15/22 1110  BNP 2,147.0*    DDimer No results for input(s): "DDIMER" in the last 168 hours.   Radiology    DG Chest Port 1 View  Result Date: 10/15/2022 CLINICAL DATA:  Shortness of breath. EXAM: PORTABLE CHEST 1 VIEW COMPARISON:  Two-view chest x-ray 05/09/2022 FINDINGS: Heart is mildly enlarged. Pericardial calcifications are again noted. Pacing wires are stable. Diffuse interstitial and airspace opacities are present, left greater than right. A left pleural  effusion is present. IMPRESSION: 1. Diffuse interstitial and airspace opacities, left greater than right. This is concerning for edema or infection. 2. Left pleural effusion. Electronically Signed   By: Marin Roberts M.D.   On: 10/15/2022 11:52    Cardiac Studies   Cardiac Studies & Procedures     STRESS TESTS  NM MYOCAR MULTI W/SPECT W 12/09/2012   ECHOCARDIOGRAM  ECHOCARDIOGRAM COMPLETE 05/09/2022  Narrative ECHOCARDIOGRAM REPORT    Patient Name:   Steven [name] [facility] Date of Exam: 05/09/2022 Medical Rec #:  [id number]    Height:       69.0 in Accession #:    [id number]   Weight:       168.3 lb Date of Birth:  [birth year]    BSA:          1.920 m Patient Age:    66 years     BP:           100/72 mmHg Patient Gender: M            HR:           64 bpm. Exam Location:  Inpatient  Procedure: 2D Echo, Cardiac Doppler, Color Doppler and Intracardiac Opacification Agent  Indications:    CHF-Acute Systolic I50.21  History:        Patient has prior history of Echocardiogram examinations, most recent 01/05/2020. CHF and Cardiomyopathy, CAD and Previous Myocardial Infarction, Arrythmias:Bradycardia, Signs/Symptoms:Shortness of Breath; Risk Factors:Dyslipidemia, Hypertension and Former Smoker.  Sonographer:    Aron Baba Referring Phys: 61 BRIAN S CRENSHAW   Sonographer Comments: Image acquisition challenging due to patient body habitus and Image acquisition challenging due to respiratory motion. IMPRESSIONS   1. On contrast images, there is an adherent mass at the inferoseptal apical portion that is consistent with laminated/chronic thrombus. No mobile aspects seen and not appreciated in other views. Left ventricular ejection fraction, by estimation, is <20%. The left ventricle has severely decreased function. The left ventricle demonstrates global hypokinesis. The left ventricular internal cavity size was moderately dilated. Left ventricular diastolic parameters are  indeterminate. 2. Right ventricular systolic function is moderately reduced. The right ventricular size is normal. There is mildly elevated pulmonary artery systolic pressure. 3. Left atrial size was mild to moderately dilated. 4. Right atrial size was mildly dilated. 5. The mitral valve is  normal in structure. Trivial mitral valve regurgitation. No evidence of mitral stenosis. 6. The aortic valve is grossly normal. Aortic valve regurgitation is not visualized. No aortic stenosis is present. 7. The inferior vena cava is dilated in size with <50% respiratory variability, suggesting right atrial pressure of 15 mmHg.  Comparison(s): Changes from prior study are noted. Prior EF 20-25%, now <20%.  Conclusion(s)/Recommendation(s): Adherent LV thrombus seen at inferoseptal apical segment. While patient has known prior LV thrombus, I did not see these on echo contrast images from 2021 study. Findings communicated to Dr. Jens Som.  FINDINGS Left Ventricle: On contrast images, there is an adherent mass at the inferoseptal apical portion that is consistent with laminated/chronic thrombus. No mobile aspects seen and not appreciated in other views. Left ventricular ejection fraction, by estimation, is <20%. The left ventricle has severely decreased function. The left ventricle demonstrates global hypokinesis. Definity contrast agent was given IV to delineate the left ventricular endocardial borders. The left ventricular internal cavity size was moderately dilated. There is no left ventricular hypertrophy. Left ventricular diastolic parameters are indeterminate.  Right Ventricle: The right ventricular size is normal. Right vetricular wall thickness was not well visualized. Right ventricular systolic function is moderately reduced. There is mildly elevated pulmonary artery systolic pressure. The tricuspid regurgitant velocity is 2.59 m/s, and with an assumed right atrial pressure of 15 mmHg, the estimated right  ventricular systolic pressure is 41.8 mmHg.  Left Atrium: Left atrial size was mild to moderately dilated.  Right Atrium: Right atrial size was mildly dilated.  Pericardium: There is no evidence of pericardial effusion.  Mitral Valve: The mitral valve is normal in structure. Trivial mitral valve regurgitation. No evidence of mitral valve stenosis.  Tricuspid Valve: The tricuspid valve is grossly normal. Tricuspid valve regurgitation is trivial. No evidence of tricuspid stenosis.  Aortic Valve: The aortic valve is grossly normal. Aortic valve regurgitation is not visualized. No aortic stenosis is present.  Pulmonic Valve: The pulmonic valve was not well visualized. Pulmonic valve regurgitation is not visualized. No evidence of pulmonic stenosis.  Aorta: The aortic root and ascending aorta are structurally normal, with no evidence of dilitation.  Venous: The inferior vena cava is dilated in size with less than 50% respiratory variability, suggesting right atrial pressure of 15 mmHg.  IAS/Shunts: The interatrial septum was not well visualized.  Additional Comments: A device lead is visualized.   LEFT VENTRICLE PLAX 2D LVIDd:         5.90 cm   Diastology LVIDs:         5.70 cm   LV e' medial:    5.43 cm/s LV PW:         1.00 cm   LV E/e' medial:  13.4 LV IVS:        0.30 cm   LV e' lateral:   12.20 cm/s LVOT diam:     1.90 cm   LV E/e' lateral: 5.9 LV SV:         54 LV SV Index:   28 LVOT Area:     2.84 cm   RIGHT VENTRICLE RV S prime:     6.81 cm/s TAPSE (M-mode): 0.9 cm  LEFT ATRIUM             Index        RIGHT ATRIUM           Index LA diam:        3.40 cm 1.77 cm/m   RA Area:  17.80 cm LA Vol (A2C):   70.8 ml 36.88 ml/m  RA Volume:   43.30 ml  22.55 ml/m LA Vol (A4C):   74.2 ml 38.65 ml/m LA Biplane Vol: 76.6 ml 39.90 ml/m AORTIC VALVE LVOT Vmax:   94.60 cm/s LVOT Vmean:  66.200 cm/s LVOT VTI:    0.191 m  AORTA Ao Root diam: 3.30 cm Ao Asc diam:  3.50  cm  MITRAL VALVE               TRICUSPID VALVE MV Area (PHT): 4.24 cm    TR Peak grad:   26.8 mmHg MV Decel Time: 179 msec    TR Vmax:        259.00 cm/s MV E velocity: 72.50 cm/s MV A velocity: 47.10 cm/s  SHUNTS MV E/A ratio:  1.54        Systemic VTI:  0.19 m Systemic Diam: 1.90 cm  Jodelle Red MD Electronically signed by Jodelle Red MD Signature Date/Time: 05/09/2022/6:12:15 PM    Final              Patient Profile     67 y.o. male with PMH chronic systolic heart failure, CAD with prior PCI, VT with ICD, history of LV thrombus, last appearing laminated on echo, hypertension, HLD, chronic kidney disease stage 3a whom we are asked to see for acute on chronic heart failure and acute hypoxic respiratory failure.   Assessment & Plan    Acute on chronic systolic and diastolic heart failure Acute hypoxic respiratory failure Chronic kidney disease stage 3a -weaned from bipap to nasal cannula. Now satting 92% on room air at rest -will ambulate to see what his O2 requirement is with activity -continue diuresis IV for today, anticipate transition to oral tomorrow -admission weight 80.3 kg, weight today 71.3 kg -charted as -5L, suspect even greater than this given patient report -continue entresto, farxiga, spironolactone. BP borderline for carvedilol, will start low dose metoprolol today instead -Cr 1.29 today, down from 1.5 -compression stockings -encourage ambulation -will replete K given ectopy today. Mg is 2   CAD Elevated troponins -no chest pain, elevated troponins most likely 2/2 demand from acute heart failure -continue atorvastatin -no aspirin as he is on coumadin   History of VT -restarting beta blocker today, had 15 seconds of ectopy this AM -ICD in place -monitor electrolytes with diuresis, as above repleting K   History of LV thrombus -on coumadin, appreciate pharmacy assistance in dosing -INR 2.7 today  For questions or updates,  please contact [facility] Please consult [email or web address] for contact info under        Signed, Jodelle Red, MD  10/17/2022, 10:51 AM

## 2022-10-17 NOTE — Progress Notes (Signed)
Heart Failure Nurse Navigator Progress Note  PCP: Ziglar, Eli Phillips, MD PCP-Cardiologist: Tresa Endo Admission Diagnosis: Respiratory distress Admitted from: Home via EMS  Presentation:   Steven [name] presented with shortness of breath, diaphoretic, BLE edema, on EMS arrival O2 sats were in the 70's on RA, placed on CPAP. Patient reported compliance with medications, BP 160/115, HR 111, Lasix IV given and Nitroglycerin drip started. CXR with concerns of diffuse interstitial and airspace opacities, edema and left pleural effusions. Patient seen by HF TOC in 05/22/2022, wanted to refer to AHF, however patient wanted to speak with Dr. Tresa Endo about this first. Upon this admission patient stated he wants to come back to the clinic because he doesn't know why his medication wasn't working and why he was in the hospital again.   Education was done on the sign and symptoms of heart failure, daily weights, when to call his doctor or go to the ED, Diet/ fluid restrictions especially salt intake, taking all medications s prescribed and attending all medical appointments. Patient verbalized his understanding, a HF TOC Follow up appointment was scheduled for 11/03/2022 @ 8:30am.   ECHO/ LVEF: <20% G1DD  Clinical Course:  Past Medical History:  Diagnosis Date   Acute MI, anterior wall    AICD (automatic cardioverter/defibrillator) present    CAD (coronary artery disease)    2D ECHO, 07/13/2011 - EF <25%, LV moderatelty dilated, LA moderately dilatedLEXISCAN, 12/14/2011 - moderate-severe perfusion defect seen in the basal anteroseptal, mid anterior, apicacl anterior, apical, apical inferior, and apical lateral regions, post-stress EF 25%, new EKG changes from baseline abnormalities   Cancer    Prostate   CHF (congestive heart failure) 2012   Hypertension 08/08/2021   Inguinal hernia, left    Pneumonia    November 2023   Pre-diabetes      Social History   Socioeconomic History   Marital status: Single     Spouse name: Not on file   Number of children: 1   Years of education: Not on file   Highest education level: Bachelor's degree (e.g., BA, AB, BS)  Occupational History   Occupation: works on Systems analyst  Tobacco Use   Smoking status: Former    Types: Cigarettes, Cigars    Quit date: 2012    Years since quitting: 12.3   Smokeless tobacco: Never  Vaping Use   Vaping Use: Never used  Substance and Sexual Activity   Alcohol use: Yes    Alcohol/week: 2.0 - 3.0 standard drinks of alcohol    Types: 2 - 3 drink(s) per week   Drug use: No   Sexual activity: Not Currently  Other Topics Concern   Not on file  Social History Narrative   Not on file   Social Determinants of Health   Financial Resource Strain: Low Risk  (05/08/2022)   Overall Financial Resource Strain (CARDIA)    Difficulty of Paying Living Expenses: Not hard at all  Food Insecurity: No Food Insecurity (10/16/2022)   Hunger Vital Sign    Worried About Running Out of Food in the Last Year: Never true    Ran Out of Food in the Last Year: Never true  Transportation Needs: No Transportation Needs (10/16/2022)   PRAPARE - Administrator, Civil Service (Medical): No    Lack of Transportation (Non-Medical): No  Physical Activity: Not on file  Stress: Not on file  Social Connections: Not on file   Education Assessment and Provision:  Detailed education and instructions provided on  heart failure disease management including the following:  Signs and symptoms of Heart Failure When to call the physician Importance of daily weights Low sodium diet Fluid restriction Medication management Anticipated future follow-up appointments  Patient education given on each of the above topics.  Patient acknowledges understanding via teach back method and acceptance of all instructions.  Education Materials:  "Living Better With Heart Failure" Booklet, HF zone tool, & Daily Weight Tracker Tool.  Patient has scale at home:  yes Patient has pill box at home: yes    High Risk Criteria for Readmission and/or Poor Patient Outcomes: Heart failure hospital admissions (last 6 months): 0  No Show rate: 16% Difficult social situation: No Demonstrates medication adherence: Yes Primary Language: English Literacy level: Reading, writing, and comprehension  Barriers of Care:   Diet/ fluids ( salt intake) Daily weights   Considerations/Referrals:   Referral made to Heart Failure Pharmacist Stewardship: Yes Referral made to Heart Failure CSW/NCM TOC: No Referral made to Heart & Vascular TOC clinic: Yes, 11/03/2022 @ 8:30 as F/u. LAst seen in HF TOC 04/2022.   Items for Follow-up on DC/TOC: Diet/ fluid restrictions ( salt intake) Daily weights ? AHF for medication management? Seen HF TOC 04/2022    Rhae Hammock, BSN, RN Heart Failure Teacher, adult education Only

## 2022-10-17 NOTE — Progress Notes (Signed)
   Heart Failure Stewardship Pharmacist Progress Note   PCP: Steven [name], Steven Phillips, MD PCP-Cardiologist: Nicki Guadalajara, MD    HPI:  67 yo M with PMH of HFrEF, CAD, VT, LV thrombus, HTN, HLD, and CKD III.   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  He presented after a three-hour delay to the hospital and was taken emergently for cath with DES to  LAD.    Followed by Dr Tresa Endo and Dr Royann Shivers for many years.   Admitted in 04/2022 with acute CHF. ECHO showed LVEF <20% and RV moderately reduced. Diuresed with IV lasix and placed on GDMT. Followed up with HF TOC clinic on 05/22/22 and was doing well. Unable to take full dose spironolactone due to chest discomfort. Discussed possible f/u with Advanced Heart Failure and suggested CPX for risk stratification however he would like to discuss with Dr Tresa Endo.   Followed up with Dr Tresa Endo in 05/2022 and 10/12/22. He was euvolemic on his exam.   Presented to the ED on 4/21 with shortness of breath and LE edema. CXR with diffuse interstitial and airspace opacities concerning for edema and left pleural effusion.   Current HF Medications: Diuretic: furosemide 60 mg IV BID Beta Blocker: metoprolol XL 25 mg daily ACE/ARB/ARNI: Entresto 24/26 mg BID MRA: spironolactone 12.5 mg daily SGLT2i: Farxiga 10 mg daily  Prior to admission HF Medications: Diuretic: furosemide 60 mg daily Beta blocker: carvedilol 6.25 mg BID ACE/ARB/ARNI: Entresto 24/26 mg BID MRA: spironolactone 12.5 mg daily SGLT2i: Farxiga 10 mg daily  Pertinent Lab Values: Serum creatinine 1.29, BUN 21, Potassium 3.8, Sodium 136, BNP 2147  Vital Signs: Weight: 157 lbs (admission weight: 177 lbs) Blood pressure: 90-100/60s  Heart rate: 70-80s  I/O: -1.4L yesterday; net -5.2L  Medication Assistance / Insurance Benefits Check: Does the patient have prescription insurance?  Yes Type of insurance plan: [place]  Medicaid  Outpatient Pharmacy:  Prior to admission outpatient pharmacy: [facility] Is the patient willing to use [facility] TOC pharmacy at discharge? Yes Is the patient willing to transition their outpatient pharmacy to utilize a [facility] outpatient pharmacy?   Pending    Assessment: 1. Acute on chronic systolic CHF (LVEF <20%). NYHA class III symptoms. - Continue furosemide 60 mg IV BID. Strict I/Os and daily weights. Keep K>4 and Mg>2. KCl 40 mEq x 1 given for replacement.  - Agree with starting metoprolol XL 25 mg daily - Continue Entresto 24/26 mg BID - Continue spironolactone 12.5 mg daily, cannot tolerate full dose - Continue Farxiga 10 mg daily   Plan: 1) Medication changes recommended at this time: - Agree with changes  2) Patient assistance: - None pending  3)  Education  - To be completed prior to discharge  Sharen Hones, PharmD, BCPS Heart Failure Engineer, building services Phone [phone]

## 2022-10-17 NOTE — Progress Notes (Addendum)
ANTICOAGULATION CONSULT NOTE  Pharmacy Consult for warfarin Indication: LV thrombus  No Known Allergies  Patient Measurements: Height:  (177.8 cm) Weight: 71.3 kg (157 lb 3 oz) IBW/kg (Calculated) : 73  Vital Signs: Temp: 98 F (36.7 C) (04/23 0351) Temp Source: Oral (04/23 0351) BP: 97/62 (04/23 0351) Pulse Rate: 84 (04/23 0351)  Labs: Recent Labs    10/15/22 1110 10/15/22 1118 10/15/22 1403 10/16/22 0100 10/17/22 0045  HGB 14.0 15.0  15.3  --   --   --   HCT 43.7 44.0  45.0  --   --   --   PLT 239  --   --   --   --   LABPROT 27.9*  --   --  27.4* 28.4*  INR 2.6*  --   --  2.6* 2.7*  CREATININE 1.60* 1.50*  --  1.36* 1.29*  TROPONINIHS 43*  --  123*  --   --      Estimated Creatinine Clearance: 56 mL/min (A) (by C-G formula based on SCr of 1.29 mg/dL (H)).   Medical History: Past Medical History:  Diagnosis Date   Acute MI, anterior wall    AICD (automatic cardioverter/defibrillator) present    CAD (coronary artery disease)    2D ECHO, 07/13/2011 - EF <25%, LV moderatelty dilated, LA moderately dilatedLEXISCAN, 12/14/2011 - moderate-severe perfusion defect seen in the basal anteroseptal, mid anterior, apicacl anterior, apical, apical inferior, and apical lateral regions, post-stress EF 25%, new EKG changes from baseline abnormalities   Cancer    Prostate   CHF (congestive heart failure) 2012   Hypertension 08/08/2021   Inguinal hernia, left    Pneumonia    November 2023   Pre-diabetes     Assessment: Steven [name] presenting with dyspnea, 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  INR on admission is therapeutic at 2.6.   PTA warfarin (last [facility] visit 4/18): 2.5mg  MWF and  all other days    INR 2.7 stable on home dose. Noted bloody sputum in bedside emesis x2 days per patient. Per RN, none overnight or currently. No new drug interactions. Will resume home dose and decrease INR check frequency.   Goal of Therapy:  INR 2-3 Monitor  platelets by anticoagulation protocol: Yes   Plan:  Warfarin 2.5mg  MWF and  TTSS Monitor Q Tue/Fri INR, weekly CBC Monitor for signs/symptoms of bleeding   Alphia Moh, PharmD, BCPS, BCCP Clinical Pharmacist  Please check AMION for all [facility] Pharmacy phone numbers After 10:00 PM, call Main Pharmacy [phone]

## 2022-10-17 NOTE — TOC Progression Note (Signed)
Transition of Care [facility]) - Progression Note    Patient Details  Name: Steven [name] MRN: [id number] Date of Birth: Jun 11, 1956  [facility] CM/SW Contact  Leone Haven, RN Phone Number: 10/17/2022, 4:08 PM  Clinical Narrative:    From home alone, indep, he has a scale which he weighs daily, he states he will need to purchase another bp cuff, he eats a low sodium diet.  He states he will have someone to transport him home a dc .  Presents with acute HF, TOC following.   Expected Discharge Plan: Home/Self Care Barriers to Discharge: Continued Medical Work up  Expected Discharge Plan and Services In-house Referral: NA Discharge Planning Services: CM Consult Post Acute Care Choice: NA Living arrangements for the past 2 months: Single Family Home                 DME Arranged: N/A DME Agency: NA       HH Arranged: NA           Social Determinants of Health (SDOH) Interventions SDOH Screenings   Food Insecurity: No Food Insecurity (10/16/2022)  Housing: Low Risk  (10/16/2022)  Transportation Needs: No Transportation Needs (10/16/2022)  Utilities: Not At Risk (10/16/2022)  Alcohol Screen: Low Risk  (05/08/2022)  Financial Resource Strain: Low Risk  (05/08/2022)  Tobacco Use: Medium Risk (10/16/2022)    Readmission Risk Interventions     No data to display

## 2022-10-17 NOTE — Progress Notes (Addendum)
On call physician notified of hypotension (see flowsheet). Patient denies distress. No obvious change in physical condition on assessment. Educated patient on fall safety in the context of hypotension.  Order received to hold evening entresto and flomax.

## 2022-10-18 ENCOUNTER — Other Ambulatory Visit (HOSPITAL_COMMUNITY): Payer: Self-pay

## 2022-10-18 DIAGNOSIS — I2489 Other forms of acute ischemic heart disease: Secondary | ICD-10-CM | POA: Insufficient documentation

## 2022-10-18 DIAGNOSIS — I5021 Acute systolic (congestive) heart failure: Secondary | ICD-10-CM

## 2022-10-18 LAB — CBC
HCT: 40.9 % (ref 39.0–52.0)
Hemoglobin: 13.8 g/dL (ref 13.0–17.0)
MCH: 30.9 pg (ref 26.0–34.0)
MCHC: 33.7 g/dL (ref 30.0–36.0)
MCV: 91.5 fL (ref 80.0–100.0)
Platelets: 213 10*3/uL (ref 150–400)
RBC: 4.47 MIL/uL (ref 4.22–5.81)
RDW: 15.2 % (ref 11.5–15.5)
WBC: 7.8 10*3/uL (ref 4.0–10.5)
nRBC: 0 % (ref 0.0–0.2)

## 2022-10-18 LAB — BASIC METABOLIC PANEL
Anion gap: 8 (ref 5–15)
BUN: 18 mg/dL (ref 8–23)
CO2: 25 mmol/L (ref 22–32)
Calcium: 8.3 mg/dL — ABNORMAL LOW (ref 8.9–10.3)
Chloride: 102 mmol/L (ref 98–111)
Creatinine, Ser: 1.25 mg/dL — ABNORMAL HIGH (ref 0.61–1.24)
GFR, Estimated: >60 mL/min (ref >60)
Glucose, Bld: 103 mg/dL — ABNORMAL HIGH (ref 70–99)
Potassium: 3.7 mmol/L (ref 3.5–5.1)
Sodium: 135 mmol/L (ref 135–145)

## 2022-10-18 LAB — PROTIME-INR
INR: 2.3 — ABNORMAL HIGH (ref 0.8–1.2)
Prothrombin Time: 25.2 seconds — ABNORMAL HIGH (ref 11.4–15.2)

## 2022-10-18 MED ORDER — TORSEMIDE 20 MG PO TABS
40.0000 mg | ORAL_TABLET | Freq: Every day | ORAL | 2 refills | Status: DC
  Filled 2022-10-18 – 2022-11-12 (×2): qty 60, 30d supply, fill #0

## 2022-10-18 MED ORDER — TORSEMIDE 20 MG PO TABS: 40.0000 mg | ORAL_TABLET | Freq: Every day | ORAL | Status: DC

## 2022-10-18 MED ORDER — METOPROLOL SUCCINATE ER 25 MG PO TB24
25.0000 mg | ORAL_TABLET | Freq: Every day | ORAL | 2 refills | Status: DC
  Filled 2022-10-18 – 2022-11-12 (×2): qty 30, 30d supply, fill #0
  Filled 2022-12-12: qty 30, 30d supply, fill #1

## 2022-10-18 MED ORDER — POTASSIUM CHLORIDE CRYS ER 20 MEQ PO TBCR
20.0000 meq | EXTENDED_RELEASE_TABLET | Freq: Every day | ORAL | 2 refills | Status: DC
  Filled 2022-10-18 – 2022-11-12 (×2): qty 30, 30d supply, fill #0
  Filled 2022-12-12: qty 30, 30d supply, fill #1

## 2022-10-18 MED ORDER — POTASSIUM CHLORIDE CRYS ER 20 MEQ PO TBCR
40.0000 meq | EXTENDED_RELEASE_TABLET | Freq: Once | ORAL | Status: AC
  Administered 2022-10-18: 40 meq via ORAL
  Filled 2022-10-18: qty 2

## 2022-10-18 NOTE — Care Management Important Message (Signed)
Important Message  Patient Details  Name: Steven [name] MRN: [id number] Date of Birth: 13-Aug-1955   Medicare Important Message Given:  Yes     [name] 10/18/2022, 7:52 AM

## 2022-10-18 NOTE — Discharge Instructions (Addendum)
Medication Changes: - STOP Lasix and START Torsemide  once daily instead. - STOP Carvedilol (Coreg) and START Metoprolol succinate (Toprol-XL)  once daily instead. - START Potassium chloride 20 mEq once daily with the Torsemide. - Continue all other home medications. If you are still taking the Zenpep (Pancrelipase) at home, OK to continue to take this. However, if you are no longer taking this, that is fine as well. If you have any questions about this medication, please reach out to prescribing provider.  **Recommend repeat lab work (BMP) in 1 week to make sure your kidney function and electrolytes are stable on the Torsemide. If you Nephrologist (kidney doctor) checks this at your visit on 10/23/2022 that is great. If not, please come by Dr. Landry Dyke office at the middle to end of next week (5/12024 to 10/27/2022) so that we can check this. You can come by anytime from 8am to 4:30pm (lab is closed around 1-2pm for lunch).**  Heart Failure Education: Weigh yourself EVERY morning after you go to the bathroom but before you eat or drink anything. Write this number down in a weight log/diary. If you gain 3 pounds overnight or 5 pounds in a week, call the office. Take your medicines as prescribed. If you have concerns about your medications, please call us before you stop taking them.  Eat low salt foods--Limit salt (sodium) to 2000 mg per day. This will help prevent your body from holding onto fluid. Read food labels as many processed foods have a lot of sodium, especially canned goods and prepackaged meats. If you would like some assistance choosing low sodium foods, we would be happy to set you up with a nutritionist. Limit all fluids for the day to less than 2 liters (64 ounces). Fluid includes all drinks, coffee, juice, ice chips, soup, jello, and all other liquids. Stay as active as you can everyday. Staying active will give you more energy and make your muscles stronger. Start with 5 minutes at  a time and work your way up to 30 minutes a day. Break up your activities--do some in the morning and some in the afternoon. Start with 3 days per week and work your way up to 5 days as you can.  If you have chest pain, feel short of breath, dizzy, or lightheaded, STOP. If you don't feel better after a short rest, call 911. If you do feel better, call the office to let us know you have symptoms with exercise.

## 2022-10-18 NOTE — Progress Notes (Signed)
Mobility Specialist Progress Note:   10/18/22 1020  Mobility  Activity Ambulated independently in hallway  Level of Assistance Independent  Assistive Device None  Distance Ambulated (ft) 500 ft  Activity Response Tolerated well  Mobility Referral Yes  $Mobility charge 1 Mobility   Pt agreeable to mobility session. Required no physical assistance. SpO2 WFL on RA. Pt back in bed with all needs met.   Addison Lank Mobility Specialist Please contact via SecureChat or  Rehab office at [phone]

## 2022-10-18 NOTE — Discharge Summary (Signed)
Discharge Summary    Patient ID: Steven [name] MRN: [id number]; DOB: 1956/04/22  Admit date: 10/15/2022 Discharge date: 10/18/2022  PCP:  [name], MD   [facility] Providers Cardiologist:  Nicki Guadalajara, MD  Electrophysiologist:  Thurmon Fair, MD  {   Discharge Diagnoses    Principal Problem:   Acute on chronic HFrEF (heart failure with reduced ejection fraction) Active Problems:   Demand ischemia   History of VT   CAD (coronary artery disease)   ICD  Medtronic protecta dual-chamber, implanted September 2012 primary prevention   Apical mural thrombus   Hyperlipidemia LDL goal <70   CKD (chronic kidney disease) stage 3, GFR 30-59 ml/min   Hypertension    Diagnostic Studies/Procedures    Echocardiogram 05/09/2022: Impressions:  1. On contrast images, there is an adherent mass at the inferoseptal  apical portion that is consistent with laminated/chronic thrombus. No  mobile aspects seen and not appreciated in other views. Left ventricular  ejection fraction, by estimation, is  <20%. The left ventricle has severely decreased function. The left  ventricle demonstrates global hypokinesis. The left ventricular internal  cavity size was moderately dilated. Left ventricular diastolic parameters  are indeterminate.   2. Right ventricular systolic function is moderately reduced. The right  ventricular size is normal. There is mildly elevated pulmonary artery  systolic pressure.   3. Left atrial size was mild to moderately dilated.   4. Right atrial size was mildly dilated.   5. The mitral valve is normal in structure. Trivial mitral valve  regurgitation. No evidence of mitral stenosis.   6. The aortic valve is grossly normal. Aortic valve regurgitation is not  visualized. No aortic stenosis is present.   7. The inferior vena cava is dilated in size with <50% respiratory  variability, suggesting right atrial pressure of 15 mmHg.   Comparison(s): Changes  from prior study are noted. Prior EF 20-25%, now  <20%.  _____________   History of Present Illness     Steven [name] is a 67 y.o. male with CAD with STEMI s/p BMS to LAD in 06/2010 and angioplasty to OM1 in 11/2010, chronic HFrEF with EF <20 on Echo in 04/2022, LV thrombus on Coumadin, VT s/p Medtronic ICD, hypertension, hyperlipidemia, pre-diabetes, and  CKD stage III who was admitted on 10/15/2022 with acute on chronic CHF.  Patient presented to [facility] ED on 10/15/2022 for evaluation of worsening dyspnea. EMS was called and upon their arrival, he was noted to be hypoxic with O2 sats in the 70s on room air and was placed on BiPAP with quick improvement. He also reported worsening lower extremity edema over the last week. He was seen by Dr. Tresa Endo on 10/12/2022 and his Lasix was increased at that time. He is on his feet a majority of the time with his job and feels like this contributes to his lower extremity edema. He started to develop worsening shortness of breath on 10/14/2022 and then symptoms acute worsened on morning of 10/15/2022. He reports compliance with his Lasix. He also reported orthopnea and PND on morning of presentation but none prior to that. No recent abdominal distension. No chest pain or palpitations.  Initial labs showed: BNP elevated at 2,147. High-sensitivity troponin 43 >> 123 consistent with demand ischemia. EKG showed sinus tachycardia, rate 124 bpm, with PVCs and anterior infarct pattern. Chest x-ray showed diffuse interstitial and airspace opacities (left > right) and left pleural effusion.  WBC 7.0, Hgb 14.0, Plts  239. Na 138, K 3.3, Cr 1.60 (baseline around 1.3 to 1.5). He was started on IV Lasix in the ED with good urinary response. Cardiology was asked to admit.  Hospital Course     Consultants: None   Acute on Hypoxic Respiratory Failure Acute on Chronic Combined CHF Patient was admitted with acute hypoxic respiratory failure secondary to acute on chronic CHF  initially requiring BiPAP as described above. BNP elevated at 2,147. Chest x-ray showed diffuse interstitial and airspace opacities (left > right) and left pleural effusion. Echo in 04/2022 showed LVEF of<20% with global hypokinesis and findings consistent with chronic LV thrombus, moderately reduced RV systolic function, and biatrial enlargement (left > right) but no significant valvular disease. He was started on IV Lasix with good response and was able to be weaned off BiPAP and then weaned off nasal cannula. Net negative 9.7 L this admission and discharge weight 154.7 lbs (down from 177 lbs on admission). He was transitioned to Torsemide  daily prior to discharge (instead of home Lasix). BP soft so home Coreg was switched to Toprol-XL  daily. Will continue home Entresto 24-26mg  twice daily, Spironolactone 12.5mg  daily, and Farxiga  daily. Compression stockings were recommended. Also recommend repeat BMET in 1 week after switching to Torsemide. He states he is scheduled to see his Nephrologist next Monday 10/23/2022. If they do not recheck his BMET then, instructed him to come by our office next week for a lab visit. Follow-up in [facility] arranged.  Demand Ischemia CAD Patient has a history of CAD with STEMI in 2012 s/p BMS to LAD in 06/2010 and then angioplasty to OM1 in 11/2010. High-sensitivity troponin mildly elevated this admission at 43 >> 123. EKG showed no acute findings. Echo as above. Patient denied any chest pain. Troponin elevation felt to be most likely secondary to demand ischemia from acute CHF rather than true ACS. No additional ischemic work-up was felt to be necessary. No aspirin due to need for full anticoagulation with Coumadin. Continue statin.  LV Thrombus Patient has a known LV thrombus. Echo this admission showed an adherent mass at the inferoseptal apical portion of the LV consistent with laminated/ chronic thrombus. Continue home Coumadin. INR stable this  admission.  History of VT Patient has a history of VT s/p Medtronic ICD. No issues this admission. Home Coreg was switched to Toprol-XL  daily due to soft BP and patient tolerated this well.  Hypertension BP soft this admission with systolic BP in the 80s to 90s but tolerating this well. Continue medications for CHF as above. Coreg was switched to Toprol-XL given it tends to not affect BP as much.  Hyperlipidemia Continue home Lipitor  daily.  CKD Stage III Creatinine 1.5 on admission (baseline around 1.3 to 1.5). Remained stable with diuresis. Creatinine 1.25 on day of discharge. Recommend repeat BMET in 1 week after switching to Torsemide.  Hypokalemia Patient mildly hypokalemic during admission. Potassium as low as 3.2 on 4/22. Potassium was supplemented daily with diuresis. Potassium 3.7 on day of discharge. Will start KCl daily at discharge given we switched him from Lasix to Torsemide. Recommend repeat BMET in 1 week.     Did the patient have an acute coronary syndrome (MI, NSTEMI, STEMI, etc) this admission?:  No.   The elevated Troponin was due to the acute medical illness (demand ischemia).    _____________  Discharge Vitals Blood pressure (!) 85/52, pulse 67, temperature 98.2 F (36.8 C), temperature source Oral, resp. rate 18,  height 5\' 10"  (1.778 m), weight 70.2 kg, SpO2 100 %.  Filed Weights   10/17/22 0017 10/17/22 0829 10/18/22 0355  Weight: 71.3 kg 71.3 kg 70.2 kg    Labs & Radiologic Studies    CBC Recent Labs    10/18/22 1039  WBC 7.8  HGB 13.8  HCT 40.9  MCV 91.5  PLT 213   Basic Metabolic Panel Recent Labs    40/98/11 0045 10/18/22 0026  NA 136 135  K 3.8 3.7  CL 102 102  CO2 22 25  GLUCOSE 108* 103*  BUN 21 18  CREATININE 1.29* 1.25*  CALCIUM 8.6* 8.3*  MG 2.0  --    Liver Function Tests No results for input(s): "AST", "ALT", "ALKPHOS", "BILITOT", "PROT", "ALBUMIN" in the last 72 hours. No results for input(s): "LIPASE",  "AMYLASE" in the last 72 hours. High Sensitivity Troponin:   Recent Labs  Lab 10/15/22 1110 10/15/22 1403  TROPONINIHS 43* 123*    BNP Invalid input(s): "POCBNP" D-Dimer No results for input(s): "DDIMER" in the last 72 hours. Hemoglobin A1C No results for input(s): "HGBA1C" in the last 72 hours. Fasting Lipid Panel No results for input(s): "CHOL", "HDL", "LDLCALC", "TRIG", "CHOLHDL", "LDLDIRECT" in the last 72 hours. Thyroid Function Tests No results for input(s): "TSH", "T4TOTAL", "T3FREE", "THYROIDAB" in the last 72 hours.  Invalid input(s): "FREET3" _____________  DG Chest Port 1 View  Result Date: 10/15/2022 CLINICAL DATA:  Shortness of breath. EXAM: PORTABLE CHEST 1 VIEW COMPARISON:  Two-view chest x-ray 05/09/2022 FINDINGS: Heart is mildly enlarged. Pericardial calcifications are again noted. Pacing wires are stable. Diffuse interstitial and airspace opacities are present, left greater than right. A left pleural effusion is present. IMPRESSION: 1. Diffuse interstitial and airspace opacities, left greater than right. This is concerning for edema or infection. 2. Left pleural effusion. Electronically Signed   By: Marin Roberts M.D.   On: 10/15/2022 11:52   Disposition   Pt is being discharged home today in good condition.  Follow-up Plans & Appointments     Follow-up Information     [facility]. Go in 15 day(s).   Specialty: Cardiology Why: Hospital follow up 11/03/2022 @ 9:30 am PLEASE bring a curretn medication list to appointment FREE valet parking, Entrance C, off [employer] information: [address] [id number] mc [place] [address] [phone]        Corrin Parker, PA-C Follow up.   Specialty: Cardiology Why: Hospital follow-up with General Cardiology scheduled for 12/05/2022 at 3:10pm with Marjie Skiff, one of Dr. Landry Dyke PAs. Please arrive 15 minutes early for  check-in. If this date/times does not work for you, please call our office to reschedule. Contact information: [address] [place] [address] [phone]         [name], MD Follow up.   Specialty: Family Medicine Why: Please follow up in a week Contact information: [address] [place] [address] [phone]                Discharge Instructions     Diet - low sodium heart healthy   Complete by: As directed    Increase activity slowly   Complete by: As directed         Discharge Medications   Allergies as of 10/18/2022   No Known Allergies      Medication List     STOP taking these medications    carvedilol 6.25 MG  tablet Commonly known as: COREG   furosemide 20 MG tablet Commonly known as: LASIX       TAKE these medications    acetaminophen 500 MG tablet Commonly known as: TYLENOL Take 1,000 mg by mouth every 6 (six) hours as needed for mild pain.   atorvastatin 80 MG tablet Commonly known as: LIPITOR Take 1 tablet (80 mg total) by mouth daily.   dapagliflozin propanediol 10 MG Tabs tablet Commonly known as: FARXIGA Take 10 mg by mouth daily.   Entresto 24-26 MG Generic drug: sacubitril-valsartan Take 1 tablet by mouth 2 (two) times daily.   metoprolol succinate 25 MG 24 hr tablet Commonly known as: TOPROL-XL Take 1 tablet (25 mg total) by mouth daily. Start taking on: October 19, 2022   multivitamin tablet Take 1 tablet by mouth daily.   nitroGLYCERIN 0.4 MG SL tablet Commonly known as: NITROSTAT Place 1 tablet (0.4 mg total) under the tongue every 5 (five) minutes as needed. Usual dose is q 5 minutes x 3 doses.   potassium chloride SA 20 MEQ tablet Commonly known as: KLOR-CON M Take 1 tablet (20 mEq total) by mouth daily.   spironolactone 25 MG tablet Commonly known as: ALDACTONE Take 0.5 tablets (12.5 mg total) by mouth every other day. TAKE EOD ON EVEN DAYS   tamsulosin 0.4 MG Caps  capsule Commonly known as: FLOMAX Take 0.4 mg by mouth at bedtime.   torsemide 20 MG tablet Commonly known as: DEMADEX Take 2 tablets (40 mg total) by mouth daily. Start taking on: October 19, 2022   Vitamin D 125 MCG (5000 UT) Caps Take 5,000 Units by mouth daily.   warfarin 5 MG tablet Commonly known as: COUMADIN Take as directed. If you are unsure how to take this medication, talk to your nurse or doctor. Original instructions: Take 1 tablet daily except 1/2 tablet on Wednesdays and Fridays or as directed. What changed:  how much to take how to take this when to take this additional instructions   Zenpep 40000-126000 units Cpep Generic drug: Pancrelipase (Lip-Prot-Amyl) Take 1 capsule by mouth 3 (three) times daily with meals.           Outstanding Labs/Studies   Repeat BMET in 1 week.  Duration of Discharge Encounter   Greater than 30 minutes including physician time.  Signed, Corrin Parker, PA-C 10/18/2022, 3:10 PM

## 2022-10-18 NOTE — Progress Notes (Signed)
ANTICOAGULATION CONSULT NOTE  Pharmacy Consult for warfarin Indication: LV thrombus  No Known Allergies  Patient Measurements: Height:  (177.8 cm) Weight: 70.2 kg (154 lb 11.2 oz) IBW/kg (Calculated) : 73  Vital Signs: Temp: 98.2 F (36.8 C) (04/24 0355) Temp Source: Oral (04/24 0355) BP: 92/63 (04/24 0355) Pulse Rate: 69 (04/24 0355)  Labs: Recent Labs    10/15/22 1110 10/15/22 1118 10/15/22 1403 10/16/22 0100 10/17/22 0045 10/18/22 0026  HGB 14.0 15.0  15.3  --   --   --   --   HCT 43.7 44.0  45.0  --   --   --   --   PLT 239  --   --   --   --   --   LABPROT 27.9*  --   --  27.4* 28.4*  --   INR 2.6*  --   --  2.6* 2.7*  --   CREATININE 1.60* 1.50*  --  1.36* 1.29* 1.25*  TROPONINIHS 43*  --  123*  --   --   --      Estimated Creatinine Clearance: 56.9 mL/min (A) (by C-G formula based on SCr of 1.25 mg/dL (H)).   Medical History: Past Medical History:  Diagnosis Date   Acute MI, anterior wall    AICD (automatic cardioverter/defibrillator) present    CAD (coronary artery disease)    2D ECHO, 07/13/2011 - EF <25%, LV moderatelty dilated, LA moderately dilatedLEXISCAN, 12/14/2011 - moderate-severe perfusion defect seen in the basal anteroseptal, mid anterior, apicacl anterior, apical, apical inferior, and apical lateral regions, post-stress EF 25%, new EKG changes from baseline abnormalities   Cancer    Prostate   CHF (congestive heart failure) 2012   Hypertension 08/08/2021   Inguinal hernia, left    Pneumonia    November 2023   Pre-diabetes     Assessment: 32 YOM presenting with dyspnea, 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  INR on admission is therapeutic at 2.6.   PTA warfarin (last [facility] visit 4/18): 2.5mg  MWF and  all other days    4/23 INR 2.7 stable on home dose. Noted bloody sputum in bedside emesis x2 days per patient. Per RN, none overnight or currently. No new drug interactions. Resume home dose and decrease INR  check frequency.   Goal of Therapy:  INR 2-3 Monitor platelets by anticoagulation protocol: Yes   Plan:  Warfarin 2.5mg  MWF and  TTSS Monitor Q Tue/Fri INR, weekly CBC Monitor for signs/symptoms of bleeding   Alphia Moh, PharmD, BCPS, BCCP Clinical Pharmacist  Please check AMION for all [facility] Pharmacy phone numbers After 10:00 PM, call Main Pharmacy [phone]

## 2022-10-18 NOTE — Progress Notes (Addendum)
   Heart Failure Stewardship Pharmacist Progress Note   PCP: Ziglar, Eli Phillips, MD PCP-Cardiologist: Nicki Guadalajara, MD    HPI:  67 yo M with PMH of HFrEF, CAD, VT, LV thrombus, HTN, HLD, and CKD III.   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  He presented after a three-hour delay to the hospital and was taken emergently for cath with DES to  LAD.    Followed by Dr Tresa Endo and Dr Royann Shivers for many years.   Admitted in 04/2022 with acute CHF. ECHO showed LVEF <20% and RV moderately reduced. Diuresed with IV lasix and placed on GDMT. Followed up with HF TOC clinic on 05/22/22 and was doing well. Unable to take full dose spironolactone due to chest discomfort. Discussed possible f/u with Advanced Heart Failure and suggested CPX for risk stratification however he would like to discuss with Dr Tresa Endo.   Followed up with Dr Tresa Endo in 05/2022 and 10/12/22. He was euvolemic on his exam.   Presented to the ED on 4/21 with shortness of breath and LE edema. CXR with diffuse interstitial and airspace opacities concerning for edema and left pleural effusion.   Discharge HF Medications: Diuretic: torsemide 40 mg daily Beta Blocker: metoprolol XL 25 mg daily ACE/ARB/ARNI: Entresto 24/26 mg BID MRA: spironolactone 12.5 mg daily SGLT2i: Farxiga 10 mg daily  Prior to admission HF Medications: Diuretic: furosemide 60 mg daily Beta blocker: carvedilol 6.25 mg BID ACE/ARB/ARNI: Entresto 24/26 mg BID MRA: spironolactone 12.5 mg daily SGLT2i: Farxiga 10 mg daily  Pertinent Lab Values: Serum creatinine 1.25, BUN 18, Potassium 3.7, Sodium 135, Magnesium 2.0, BNP 2147  Vital Signs: Weight: 154 lbs (admission weight: 177 lbs) Blood pressure: 90-100/60s  Heart rate: 70-80s  I/O: -3.7L yesterday; net -9.7L  Medication Assistance / Insurance Benefits Check: Does the patient have prescription insurance?  Yes Type of insurance plan: [place]  Medicaid  Outpatient Pharmacy:  Prior to admission outpatient pharmacy: [facility] Is the patient willing to use [facility] TOC pharmacy at discharge? Yes Is the patient willing to transition their outpatient pharmacy to utilize a [facility] outpatient pharmacy?   No    Assessment: 1. Acute on chronic systolic CHF (LVEF <20%). NYHA class II symptoms. - Agree with torsemide 40 mg daily for discharge. Strict I/Os and daily weights. Keep K>4 and Mg>2. KCl 40 mEq x 1 given for replacement.  - Continue metoprolol XL 25 mg daily - Continue Entresto 24/26 mg BID - Continue spironolactone 12.5 mg daily, cannot tolerate full dose - Continue Farxiga 10 mg daily   Plan: 1) Medication changes recommended at this time: - Agree with changes  2) Patient assistance: - None pending  3)  Education  - Patient has been educated on current HF medications and potential additions to HF medication regimen - Patient verbalizes understanding that over the next few months, these medication doses may change and more medications may be added to optimize HF regimen - Patient has been educated on basic disease state pathophysiology and goals of therapy   Sharen Hones, PharmD, BCPS Heart Failure Stewardship Pharmacist Phone [phone]

## 2022-10-18 NOTE — Progress Notes (Signed)
Rounding Note    Patient Name: Steven [name] Date of Encounter: 10/18/2022  [facility] Cardiologist: Nicki Guadalajara, MD   Subjective   No acute events overnight. Walked with PT this AM and did not drop O2 sats.  Inpatient Medications    Scheduled Meds:  atorvastatin  80 mg Oral Daily   dapagliflozin propanediol  10 mg Oral Daily   lipase/protease/amylase  36,000 Units Oral TID WC   metoprolol succinate  25 mg Oral Daily   sacubitril-valsartan  1 tablet Oral BID   sodium chloride flush  3 mL Intravenous Q12H   spironolactone  12.5 mg Oral QODAY   tamsulosin  0.4 mg Oral QHS   torsemide  40 mg Oral Daily   warfarin  2.5 mg Oral Once per day on Mon Wed Fri   And   warfarin  5 mg Oral Once per day on Sun Tue Thu Sat   Warfarin - Pharmacist Dosing Inpatient   Does not apply q1600   Continuous Infusions:  sodium chloride     PRN Meds: sodium chloride, acetaminophen, ondansetron (ZOFRAN) IV, sodium chloride flush   Vital Signs    Vitals:   10/17/22 2007 10/18/22 0014 10/18/22 0355 10/18/22 1141  BP: (!) 82/44 90/69 92/63  (!) 85/52  Pulse:  69 69 67  Resp:  18 18 18   Temp:  98 F (36.7 C) 98.2 F (36.8 C)   TempSrc:  Oral Oral   SpO2:  99% 97% 100%  Weight:   70.2 kg   Height:        Intake/Output Summary (Last 24 hours) at 10/18/2022 1210 Last data filed at 10/18/2022 0357 Gross per 24 hour  Intake 6 ml  Output 1875 ml  Net -1869 ml      10/18/2022    3:55 AM 10/17/2022    8:29 AM 10/17/2022   12:17 AM  Last 3 Weights  Weight (lbs) 154 lb 11.2 oz 157 lb 3 oz 157 lb 3 oz  Weight (kg) 70.171 kg 71.3 kg 71.3 kg      Telemetry    SR, rare a pacing, no ectopy- Personally Reviewed  ECG    No new since 10/15/22- Personally Reviewed  Physical Exam   GEN: Well nourished, well developed in no acute distress NECK: No JVD sitting upright CARDIAC: regular rhythm, normal S1 and S2, no rubs or gallops. No murmur. VASCULAR: Radial pulses 2+ bilaterally.   RESPIRATORY:  Clear to auscultation without rales, wheezing or rhonchi  ABDOMEN: Soft, non-tender, non-distended MUSCULOSKELETAL:  Moves all 4 limbs independently SKIN: Warm and dry, no significant edema NEUROLOGIC:  No focal neuro deficits noted. PSYCHIATRIC:  Normal affect    Labs    High Sensitivity Troponin:   Recent Labs  Lab 10/15/22 1110 10/15/22 1403  TROPONINIHS 43* 123*     Chemistry Recent Labs  Lab 10/16/22 0100 10/17/22 0045 10/18/22 0026  NA 139 136 135  K 3.2* 3.8 3.7  CL 104 102 102  CO2 21* 22 25  GLUCOSE 94 108* 103*  BUN 19 21 18   CREATININE 1.36* 1.29* 1.25*  CALCIUM 8.8* 8.6* 8.3*  MG  --  2.0  --   GFRNONAA 57* >60 >60  ANIONGAP 14 12 8     Lipids No results for input(s): "CHOL", "TRIG", "HDL", "LABVLDL", "LDLCALC", "CHOLHDL" in the last 168 hours.  Hematology Recent Labs  Lab 10/15/22 1110 10/15/22 1118 10/18/22 1039  WBC 7.0  --  7.8  RBC 4.56  --  4.47  HGB 14.0 15.0  15.3 13.8  HCT 43.7 44.0  45.0 40.9  MCV 95.8  --  91.5  MCH 30.7  --  30.9  MCHC 32.0  --  33.7  RDW 15.3  --  15.2  PLT 239  --  213   Thyroid No results for input(s): "TSH", "FREET4" in the last 168 hours.  BNP Recent Labs  Lab 10/15/22 1110  BNP 2,147.0*    DDimer No results for input(s): "DDIMER" in the last 168 hours.   Radiology    No results found.  Cardiac Studies   Cardiac Studies & Procedures     STRESS TESTS  NM MYOCAR MULTI W/SPECT W 12/09/2012   ECHOCARDIOGRAM  ECHOCARDIOGRAM COMPLETE 05/09/2022  Narrative ECHOCARDIOGRAM REPORT    Patient Name:   Steven [name] [facility] Date of Exam: 05/09/2022 Medical Rec #:  [id number]    Height:       69.0 in Accession #:    [id number]   Weight:       168.3 lb Date of Birth:  [birth year]    BSA:          1.920 m Patient Age:    66 years     BP:           100/72 mmHg Patient Gender: M            HR:           64 bpm. Exam Location:  Inpatient  Procedure: 2D Echo, Cardiac Doppler, Color Doppler and  Intracardiac Opacification Agent  Indications:    CHF-Acute Systolic I50.21  History:        Patient has prior history of Echocardiogram examinations, most recent 01/05/2020. CHF and Cardiomyopathy, CAD and Previous Myocardial Infarction, Arrythmias:Bradycardia, Signs/Symptoms:Shortness of Breath; Risk Factors:Dyslipidemia, Hypertension and Former Smoker.  Sonographer:    Aron Baba Referring Phys: [id number] BRIAN S CRENSHAW   Sonographer Comments: Image acquisition challenging due to patient body habitus and Image acquisition challenging due to respiratory motion. IMPRESSIONS   1. On contrast images, there is an adherent mass at the inferoseptal apical portion that is consistent with laminated/chronic thrombus. No mobile aspects seen and not appreciated in other views. Left ventricular ejection fraction, by estimation, is <20%. The left ventricle has severely decreased function. The left ventricle demonstrates global hypokinesis. The left ventricular internal cavity size was moderately dilated. Left ventricular diastolic parameters are indeterminate. 2. Right ventricular systolic function is moderately reduced. The right ventricular size is normal. There is mildly elevated pulmonary artery systolic pressure. 3. Left atrial size was mild to moderately dilated. 4. Right atrial size was mildly dilated. 5. The mitral valve is normal in structure. Trivial mitral valve regurgitation. No evidence of mitral stenosis. 6. The aortic valve is grossly normal. Aortic valve regurgitation is not visualized. No aortic stenosis is present. 7. The inferior vena cava is dilated in size with <50% respiratory variability, suggesting right atrial pressure of 15 mmHg.  Comparison(s): Changes from prior study are noted. Prior EF 20-25%, now <20%.  Conclusion(s)/Recommendation(s): Adherent LV thrombus seen at inferoseptal apical segment. While patient has known prior LV thrombus, I did not see these on echo  contrast images from 2021 study. Findings communicated to Dr. Jens Som.  FINDINGS Left Ventricle: On contrast images, there is an adherent mass at the inferoseptal apical portion that is consistent with laminated/chronic thrombus. No mobile aspects seen and not appreciated in other views. Left ventricular ejection fraction, by estimation, is <20%. The left ventricle has  severely decreased function. The left ventricle demonstrates global hypokinesis. Definity contrast agent was given IV to delineate the left ventricular endocardial borders. The left ventricular internal cavity size was moderately dilated. There is no left ventricular hypertrophy. Left ventricular diastolic parameters are indeterminate.  Right Ventricle: The right ventricular size is normal. Right vetricular wall thickness was not well visualized. Right ventricular systolic function is moderately reduced. There is mildly elevated pulmonary artery systolic pressure. The tricuspid regurgitant velocity is 2.59 m/s, and with an assumed right atrial pressure of 15 mmHg, the estimated right ventricular systolic pressure is 41.8 mmHg.  Left Atrium: Left atrial size was mild to moderately dilated.  Right Atrium: Right atrial size was mildly dilated.  Pericardium: There is no evidence of pericardial effusion.  Mitral Valve: The mitral valve is normal in structure. Trivial mitral valve regurgitation. No evidence of mitral valve stenosis.  Tricuspid Valve: The tricuspid valve is grossly normal. Tricuspid valve regurgitation is trivial. No evidence of tricuspid stenosis.  Aortic Valve: The aortic valve is grossly normal. Aortic valve regurgitation is not visualized. No aortic stenosis is present.  Pulmonic Valve: The pulmonic valve was not well visualized. Pulmonic valve regurgitation is not visualized. No evidence of pulmonic stenosis.  Aorta: The aortic root and ascending aorta are structurally normal, with no evidence of  dilitation.  Venous: The inferior vena cava is dilated in size with less than 50% respiratory variability, suggesting right atrial pressure of 15 mmHg.  IAS/Shunts: The interatrial septum was not well visualized.  Additional Comments: A device lead is visualized.   LEFT VENTRICLE PLAX 2D LVIDd:         5.90 cm   Diastology LVIDs:         5.70 cm   LV e' medial:    5.43 cm/s LV PW:         1.00 cm   LV E/e' medial:  13.4 LV IVS:        0.30 cm   LV e' lateral:   12.20 cm/s LVOT diam:     1.90 cm   LV E/e' lateral: 5.9 LV SV:         54 LV SV Index:   28 LVOT Area:     2.84 cm   RIGHT VENTRICLE RV S prime:     6.81 cm/s TAPSE (M-mode): 0.9 cm  LEFT ATRIUM             Index        RIGHT ATRIUM           Index LA diam:        3.40 cm 1.77 cm/m   RA Area:     17.80 cm LA Vol (A2C):   70.8 ml 36.88 ml/m  RA Volume:   43.30 ml  22.55 ml/m LA Vol (A4C):   74.2 ml 38.65 ml/m LA Biplane Vol: 76.6 ml 39.90 ml/m AORTIC VALVE LVOT Vmax:   94.60 cm/s LVOT Vmean:  66.200 cm/s LVOT VTI:    0.191 m  AORTA Ao Root diam: 3.30 cm Ao Asc diam:  3.50 cm  MITRAL VALVE               TRICUSPID VALVE MV Area (PHT): 4.24 cm    TR Peak grad:   26.8 mmHg MV Decel Time: 179 msec    TR Vmax:        259.00 cm/s MV E velocity: 72.50 cm/s MV A velocity: 47.10 cm/s  SHUNTS MV E/A ratio:  1.54  Systemic VTI:  0.19 m Systemic Diam: 1.90 cm  Jodelle Red MD Electronically signed by Jodelle Red MD Signature Date/Time: 05/09/2022/6:12:15 PM    Final              Patient Profile     67 y.o. male with PMH chronic systolic heart failure, CAD with prior PCI, VT with ICD, history of LV thrombus, last appearing laminated on echo, hypertension, HLD, chronic kidney disease stage 3a whom we are asked to see for acute on chronic heart failure and acute hypoxic respiratory failure.   Assessment & Plan    Acute on chronic systolic and diastolic heart failure Acute hypoxic  respiratory failure Chronic kidney disease stage 3a -weaned from bipap to nasal cannula. Now satting well on room air. -ambulated without dropping O2 -will transition to oral today, given progression of symptoms on lasix, will trial torsemide -admission weight 80.3 kg, weight today 70.2 kg -charted as -9.7L, suspect even greater than this given patient report -continue entresto, farxiga, spironolactone. BP borderline for carvedilol, started low dose metoprolol instead -Cr 1.25 today, down from 1.5 -compression stockings -encourage ambulation   CAD Elevated troponins -no chest pain, elevated troponins most likely 2/2 demand from acute heart failure -continue atorvastatin -no aspirin as he is on coumadin   History of VT -tolerating low dose metoprolol -ICD in place -monitor electrolytes with diuresis   History of LV thrombus -on coumadin, appreciate pharmacy assistance in dosing -INR 2.3 today  Plan for discharge today  For questions or updates, please contact [facility] Please consult [email or web address] for contact info under     Signed, Jodelle Red, MD  10/18/2022, 12:10 PM

## 2022-10-18 NOTE — TOC Transition Note (Signed)
Transition of Care [facility]) - CM/SW Discharge Note   Patient Details  Name: Steven [name] MRN: [id number] Date of Birth: March 26, 1956  [facility] of [facility]) CM/SW Contact:  Leone Haven, RN Phone Number: 10/18/2022, 2:19 PM   Clinical Narrative:    Patient is for dc today, he has no needs. He has transportation.    Final next level of care: Home/Self Care Barriers to Discharge: Continued Medical Work up   Patient Goals and CMS Choice   Choice offered to / list presented to : NA  Discharge Placement                         Discharge Plan and Services Additional resources added to the After Visit Summary for   In-house Referral: NA Discharge Planning Services: CM Consult Post Acute Care Choice: NA          DME Arranged: N/A DME Agency: NA       HH Arranged: NA          Social Determinants of Health (SDOH) Interventions SDOH Screenings   Food Insecurity: No Food Insecurity (10/16/2022)  Housing: Low Risk  (10/16/2022)  Transportation Needs: No Transportation Needs (10/16/2022)  Utilities: Not At Risk (10/16/2022)  Alcohol Screen: Low Risk  (05/08/2022)  Financial Resource Strain: Low Risk  (05/08/2022)  Tobacco Use: Medium Risk (10/16/2022)     Readmission Risk Interventions     No data to display

## 2022-10-22 ENCOUNTER — Encounter: Payer: Self-pay | Admitting: Cardiovascular Disease

## 2022-10-24 LAB — LAB REPORT - SCANNED: EGFR: 45

## 2022-10-30 ENCOUNTER — Telehealth (HOSPITAL_COMMUNITY): Payer: Self-pay | Admitting: Cardiology

## 2022-11-02 ENCOUNTER — Ambulatory Visit (HOSPITAL_COMMUNITY): Payer: 59

## 2022-11-03 ENCOUNTER — Encounter (HOSPITAL_COMMUNITY): Payer: Self-pay

## 2022-11-03 ENCOUNTER — Ambulatory Visit (HOSPITAL_COMMUNITY): Payer: 59

## 2022-11-14 ENCOUNTER — Other Ambulatory Visit (HOSPITAL_COMMUNITY): Payer: Self-pay

## 2022-11-15 ENCOUNTER — Encounter (HOSPITAL_COMMUNITY): Payer: 59

## 2022-11-15 ENCOUNTER — Encounter: Payer: Self-pay | Admitting: Nephrology

## 2022-11-17 ENCOUNTER — Other Ambulatory Visit (HOSPITAL_COMMUNITY): Payer: Self-pay

## 2022-11-21 ENCOUNTER — Ambulatory Visit (HOSPITAL_COMMUNITY)
Admission: RE | Admit: 2022-11-21 | Discharge: 2022-11-21 | Disposition: A | Discharge status: Home / Self Care | Payer: 59 | Source: Ambulatory Visit | Attending: Cardiology | Admitting: Cardiology

## 2022-11-21 ENCOUNTER — Ambulatory Visit (INDEPENDENT_AMBULATORY_CARE_PROVIDER_SITE_OTHER): Payer: 59

## 2022-11-21 ENCOUNTER — Encounter (HOSPITAL_COMMUNITY): Payer: Self-pay

## 2022-11-21 ENCOUNTER — Encounter (HOSPITAL_COMMUNITY): Payer: 59

## 2022-11-21 ENCOUNTER — Other Ambulatory Visit: Payer: Self-pay

## 2022-11-21 VITALS — BP 92/70 | HR 69 | Ht 70.0 in | Wt 154.0 lb

## 2022-11-21 DIAGNOSIS — I5042 Chronic combined systolic (congestive) and diastolic (congestive) heart failure: Secondary | ICD-10-CM | POA: Insufficient documentation

## 2022-11-21 DIAGNOSIS — I255 Ischemic cardiomyopathy: Secondary | ICD-10-CM | POA: Insufficient documentation

## 2022-11-21 DIAGNOSIS — N1831 Chronic kidney disease, stage 3a: Secondary | ICD-10-CM | POA: Diagnosis not present

## 2022-11-21 DIAGNOSIS — Z7901 Long term (current) use of anticoagulants: Secondary | ICD-10-CM | POA: Insufficient documentation

## 2022-11-21 DIAGNOSIS — E785 Hyperlipidemia, unspecified: Secondary | ICD-10-CM | POA: Insufficient documentation

## 2022-11-21 DIAGNOSIS — I2510 Atherosclerotic heart disease of native coronary artery without angina pectoris: Secondary | ICD-10-CM | POA: Insufficient documentation

## 2022-11-21 DIAGNOSIS — I513 Intracardiac thrombosis, not elsewhere classified: Secondary | ICD-10-CM | POA: Diagnosis not present

## 2022-11-21 DIAGNOSIS — Z79899 Other long term (current) drug therapy: Secondary | ICD-10-CM | POA: Diagnosis not present

## 2022-11-21 DIAGNOSIS — I252 Old myocardial infarction: Secondary | ICD-10-CM | POA: Diagnosis not present

## 2022-11-21 DIAGNOSIS — Z955 Presence of coronary angioplasty implant and graft: Secondary | ICD-10-CM | POA: Insufficient documentation

## 2022-11-21 DIAGNOSIS — Z87891 Personal history of nicotine dependence: Secondary | ICD-10-CM | POA: Insufficient documentation

## 2022-11-21 DIAGNOSIS — Z95810 Presence of automatic (implantable) cardiac defibrillator: Secondary | ICD-10-CM | POA: Insufficient documentation

## 2022-11-21 DIAGNOSIS — I130 Hypertensive heart and chronic kidney disease with heart failure and stage 1 through stage 4 chronic kidney disease, or unspecified chronic kidney disease: Secondary | ICD-10-CM | POA: Diagnosis not present

## 2022-11-21 DIAGNOSIS — I454 Nonspecific intraventricular block: Secondary | ICD-10-CM | POA: Diagnosis not present

## 2022-11-21 LAB — CUP PACEART REMOTE DEVICE CHECK
Battery Remaining Longevity: 78 mo
Battery Voltage: 3 V
Brady Statistic AP VP Percent: 0.02 %
Brady Statistic AP VS Percent: 43.87 %
Brady Statistic AS VP Percent: 0.03 %
Brady Statistic AS VS Percent: 56.08 %
Brady Statistic RA Percent Paced: 43.07 %
Brady Statistic RV Percent Paced: 0.05 %
Date Time Interrogation Session: 20240528052604
HighPow Impedance: 46 Ohm
HighPow Impedance: 64 Ohm
Implantable Lead Connection Status: 753985
Implantable Lead Connection Status: 753985
Implantable Lead Implant Date: 20120906
Implantable Lead Implant Date: 20120906
Implantable Lead Location: 753859
Implantable Lead Location: 753860
Implantable Lead Model: 185
Implantable Lead Model: 5076
Implantable Lead Serial Number: 358872
Implantable Pulse Generator Implant Date: 20200831
Lead Channel Impedance Value: 323 Ohm
Lead Channel Impedance Value: 342 Ohm
Lead Channel Impedance Value: 380 Ohm
Lead Channel Pacing Threshold Amplitude: 0.5 V
Lead Channel Pacing Threshold Amplitude: 0.75 V
Lead Channel Pacing Threshold Pulse Width: 0.4 ms
Lead Channel Pacing Threshold Pulse Width: 0.4 ms
Lead Channel Sensing Intrinsic Amplitude: 1.875 mV
Lead Channel Sensing Intrinsic Amplitude: 1.875 mV
Lead Channel Sensing Intrinsic Amplitude: 4.75 mV
Lead Channel Sensing Intrinsic Amplitude: 4.75 mV
Lead Channel Setting Pacing Amplitude: 1.5 V
Lead Channel Setting Pacing Amplitude: 2.5 V
Lead Channel Setting Pacing Pulse Width: 0.4 ms
Lead Channel Setting Sensing Sensitivity: 0.3 mV
Zone Setting Status: 755011
Zone Setting Status: 755011

## 2022-11-21 LAB — BASIC METABOLIC PANEL
Anion gap: 7 (ref 5–15)
BUN: 24 mg/dL — ABNORMAL HIGH (ref 8–23)
CO2: 27 mmol/L (ref 22–32)
Calcium: 9.1 mg/dL (ref 8.9–10.3)
Chloride: 99 mmol/L (ref 98–111)
Creatinine, Ser: 1.64 mg/dL — ABNORMAL HIGH (ref 0.61–1.24)
GFR, Estimated: 46 mL/min — ABNORMAL LOW (ref >60)
Glucose, Bld: 140 mg/dL — ABNORMAL HIGH (ref 70–99)
Potassium: 3.6 mmol/L (ref 3.5–5.1)
Sodium: 133 mmol/L — ABNORMAL LOW (ref 135–145)

## 2022-11-21 LAB — CBC
HCT: 43.2 % (ref 39.0–52.0)
Hemoglobin: 14.4 g/dL (ref 13.0–17.0)
MCH: 31.2 pg (ref 26.0–34.0)
MCHC: 33.3 g/dL (ref 30.0–36.0)
MCV: 93.5 fL (ref 80.0–100.0)
Platelets: 170 10*3/uL (ref 150–400)
RBC: 4.62 MIL/uL (ref 4.22–5.81)
RDW: 14.8 % (ref 11.5–15.5)
WBC: 7.7 10*3/uL (ref 4.0–10.5)
nRBC: 0 % (ref 0.0–0.2)

## 2022-11-21 LAB — BRAIN NATRIURETIC PEPTIDE: B Natriuretic Peptide: 914.3 pg/mL — ABNORMAL HIGH (ref 0.0–100.0)

## 2022-11-21 MED ORDER — ATORVASTATIN CALCIUM 80 MG PO TABS
80.0000 mg | ORAL_TABLET | Freq: Every day | ORAL | 3 refills | Status: DC
  Filled 2022-11-21: qty 30, 30d supply, fill #0
  Filled 2023-01-01 (×2): qty 30, 30d supply, fill #1

## 2022-11-21 MED ORDER — DAPAGLIFLOZIN PROPANEDIOL 10 MG PO TABS
10.0000 mg | ORAL_TABLET | Freq: Every day | ORAL | 3 refills | Status: DC
  Filled 2022-11-21 – 2023-01-11 (×2): qty 30, 30d supply, fill #0
  Filled 2023-02-12: qty 30, 30d supply, fill #1

## 2022-11-21 MED ORDER — ENTRESTO 24-26 MG PO TABS
1.0000 | ORAL_TABLET | Freq: Two times a day (BID) | ORAL | 3 refills | Status: DC
  Filled 2022-11-21 – 2023-01-11 (×2): qty 60, 30d supply, fill #0
  Filled 2023-02-12 – 2023-02-22 (×2): qty 60, 30d supply, fill #1

## 2022-11-21 NOTE — Progress Notes (Signed)
HEART & VASCULAR TRANSITION OF CARE CONSULT NOTE     Referring Physician: Dr. Cristal Deer  Primary Care: Primary Cardiologist: Nicki Guadalajara, MD  EP: Dr  Royann Shivers   HPI: Referred to clinic by Dr. Cristal Deer for heart failure consultation.   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  He presented after a three-hour delay to the hospital and was taken emergently for cath with DES to LAD.    Followed by Dr Tresa Endo and Dr Royann Shivers for many years.   Last echo 11/23 EF 20-25%, RV moderately reduced, + mural thrombus   Recent admit 4/24 for a/c CHF. BNP elevated at 2,147. High-sensitivity troponin 43 >> 123 consistent with demand ischemia. EKG showed sinus tachycardia, rate 124 bpm, with PVCs and anterior infarct pattern. Chest x-ray showed diffuse interstitial and airspace opacities (left > right) and left pleural effusion. He was diuresed from 177>>154 lb, then transitioned to PO torsemide, 40 mg daily. Referred to TOC at discharge.   He presents today for f/u. Reports feeling fairly decent since d/c. Able to do basic ADLs w/o exertional dyspnea. Denies resting dyspnea. No orthopnea or PND. Wt stable since d/c at 154 lb. Device interrogation shows slight decrease in thoracic impedence over the last few days but starting to trend back to baseline. Fluid index trending up but still well below threshold. No VT/VF. No AT/AF. Activity level ~2.5 hr a day. BP soft 92/70 but he reports his BP is always low. He denies positional dizziness/orthostatic symptoms. Already took meds today.     Cardiac Testing  04/2022 Echo EF 20-25% RV moderately reduced, , mural thrombos.   2021 LVEF 20-25% RV normal  Cath 2012. DES to LAD    Review of Systems: [y] = yes, [ ]  = no   General: Weight gain [ ] ; Weight loss [ ] ; Anorexia [ ] ; Fatigue [ ] ; Fever [ ] ; Chills [ ] ; Weakness [ ]   Cardiac: Chest pain/pressure [ ] ; Resting SOB [ ] ; Exertional SOB [ Y]; Orthopnea [ ] ; Pedal Edema [ ] ; Palpitations [ ] ; Syncope [ ] ; Presyncope [ ] ; Paroxysmal nocturnal dyspnea[ ]   Pulmonary: Cough [ ] ; Wheezing[ ] ; Hemoptysis[ ] ; Sputum [ ] ; Snoring [ ]   GI: Vomiting[ ] ; Dysphagia[ ] ; Melena[ ] ; Hematochezia [ ] ; Heartburn[ ] ; Abdominal pain [ ] ; Constipation [ ] ; Diarrhea [ ] ; BRBPR [ ]   GU: Hematuria[ ] ; Dysuria [ ] ; Nocturia[ ]   Vascular: Pain in legs with walking [ ] ; Pain in feet with lying flat [ ] ; Non-healing sores [ ] ; Stroke [ ] ; TIA [ ] ; Slurred speech [ ] ;  Neuro: Headaches[ ] ; Vertigo[ ] ; Seizures[ ] ; Paresthesias[ ] ;Blurred vision [ ] ; Diplopia [ ] ; Vision changes [ ]   Ortho/Skin: Arthritis [ ] ; Joint pain [ ] ; Muscle pain [ ] ; Joint swelling [ ] ; Back Pain [ ] ; Rash [ ]   Psych: Depression[ ] ; Anxiety[ ]   Heme: Bleeding problems [ ] ; Clotting disorders [ ] ; Anemia [ ]   Endocrine: Diabetes [ ] ; Thyroid dysfunction[ ]    Past Medical History:  Diagnosis Date   Acute MI, anterior wall (HCC)    AICD (automatic cardioverter/defibrillator) present    CAD (coronary artery disease)    2D ECHO, 07/13/2011 -  EF <25%, LV moderatelty dilated, LA moderately dilatedLEXISCAN, 12/14/2011 - moderate-severe perfusion defect seen in the basal anteroseptal, mid anterior, apicacl anterior, apical, apical inferior, and apical lateral regions, post-stress EF 25%, new EKG changes from baseline abnormalities   Cancer [facility])    Prostate   CHF (congestive heart failure) (HCC) 2012   Hypertension 08/08/2021   Inguinal hernia, left    Pneumonia    November 2023   Pre-diabetes     Current Outpatient Medications  Medication Sig Dispense Refill   acetaminophen (TYLENOL) 500 MG tablet Take 1,000 mg by mouth every 6 (six) hours as needed for mild pain.      Cholecalciferol (VITAMIN D) 125 MCG (5000 UT) CAPS Take 5,000 Units by mouth daily.     metoprolol succinate (TOPROL-XL) 25 MG 24 hr tablet Take 1 tablet (25 mg total) by mouth daily. 30 tablet 2   Multiple Vitamin (MULTIVITAMIN) tablet Take 1 tablet by mouth daily.     potassium chloride SA (KLOR-CON M) 20 MEQ tablet Take 1 tablet (20 mEq total) by mouth daily. 30 tablet 2   spironolactone (ALDACTONE) 25 MG tablet Take 0.5 tablets (12.5 mg total) by mouth every other day. TAKE EOD ON EVEN DAYS 15 tablet 6   tamsulosin (FLOMAX) 0.4 MG CAPS capsule Take 0.4 mg by mouth at bedtime.     torsemide (DEMADEX) 20 MG tablet Take 2 tablets (40 mg total) by mouth daily. 60 tablet 2   warfarin (COUMADIN) 5 MG tablet Take 1 tablet daily except 1/2 tablet on Wednesdays and Fridays or as directed. (Patient taking differently: Take 2-5 mg by mouth See admin instructions. Take 1 tablet daily except 1/2 tablet on Monday, Wednesdays and Fridays or as directed.) 90 tablet 1   ZENPEP 40000-126000 units CPEP Take 1 capsule by mouth 3 (three) times daily with meals.     atorvastatin (LIPITOR) 80 MG tablet Take 1 tablet (80 mg total) by mouth daily. 30 tablet 3   dapagliflozin propanediol (FARXIGA) 10 MG TABS tablet Take 1 tablet (10 mg total) by mouth daily. 30 tablet 3   nitroGLYCERIN (NITROSTAT) 0.4 MG SL tablet Place 1 tablet (0.4 mg total) under the tongue every 5 (five) minutes as needed. Usual dose is q 5 minutes x 3 doses. (Patient not taking: Reported on 11/21/2022) 75 tablet 0   sacubitril-valsartan (ENTRESTO) 24-26 MG Take 1 tablet by mouth 2 (two) times daily. 60 tablet 3   No current facility-administered medications for this encounter.    No Known Allergies    Social History   Socioeconomic History   Marital status: Single    Spouse name: Not on file   Number of children: 1   Years of education: Not on file   Highest education level: Bachelor's degree (e.g., BA, AB, BS)  Occupational History    Occupation: works on Systems analyst  Tobacco Use   Smoking status: Former    Types: Cigarettes, Cigars    Quit date: 2012    Years since quitting: 12.4   Smokeless tobacco: Never  Vaping Use   Vaping Use: Never used  Substance and Sexual Activity   Alcohol use: Yes    Alcohol/week: 2.0 - 3.0 standard drinks of alcohol    Types: 2 - 3 drink(s) per week   Drug use: No   Sexual activity: Not Currently  Other Topics Concern   Not on file  Social History Narrative   Not on file   Social Determinants of Health   Financial Resource  Strain: Low Risk  (05/08/2022)   Overall Financial Resource Strain (CARDIA)    Difficulty of Paying Living Expenses: Not hard at all  Food Insecurity: No Food Insecurity (10/16/2022)   Hunger Vital Sign    Worried About Running Out of Food in the Last Year: Never true    Ran Out of Food in the Last Year: Never true  Transportation Needs: No Transportation Needs (10/16/2022)   PRAPARE - Administrator, Civil Service (Medical): No    Lack of Transportation (Non-Medical): No  Physical Activity: Not on file  Stress: Not on file  Social Connections: Not on file  Intimate Partner Violence: Not At Risk (10/16/2022)   Humiliation, Afraid, Rape, and Kick questionnaire    Fear of Current or Ex-Partner: No    Emotionally Abused: No    Physically Abused: No    Sexually Abused: No    Vitals:   11/21/22 1050  BP: 92/70  Pulse: 69  SpO2: 98%  Weight: 69.9 kg (154 lb)  Height: 5\' 10"  (1.778 m)    Wt Readings from Last 3 Encounters:  11/21/22 69.9 kg (154 lb)  10/18/22 70.2 kg (154 lb 11.2 oz)  10/12/22 78.9 kg (174 lb)    PHYSICAL EXAM: General:  Well appearing, thin AAM. No respiratory difficulty HEENT: normal Neck: supple. no JVD. Carotids 2+ bilat; no bruits. No lymphadenopathy or thyromegaly appreciated. Cor: PMI nondisplaced. Regular rate & rhythm. No rubs, gallops or murmurs. Lungs: clear Abdomen: soft, nontender, nondistended. No  hepatosplenomegaly. No bruits or masses. Good bowel sounds. Extremities: no cyanosis, clubbing, rash, edema, warm distal ext  Neuro: alert & oriented x 3, cranial nerves grossly intact. moves all 4 extremities w/o difficulty. Affect pleasant.   ASSESSMENT & PLAN:  1. Chronic Systolic Heart Failure/ Ischemic CM  - EF has been down for many years, due to ICM, H/o large anterior MI due to LAD infarct  - H/o VT, s/p Medtroinc ICD - Most recent ECHO 04/2022 EF <20%, with mod RV dysfunction  - NYHA class II. Euvolemic on exam and by Optivol. ~2.5 hr of activity daily. No VT on interrogation - He claims that since being diuresed and discharged from the hospital, he has been able to get around and do things he desires to do w/o much significant limitation. Referral to the [facility] was discussed previous TOC visit in 04/2022 and he was undecided if he would want any advanced therapies (ie VAD).  - Med titration limited today given soft BP. Denies orthostatic symptoms. No med changes made today - check BMP and BNP level  - continue Farxiga 10 mg daily  - continue Entresto 24-26 mg bid - continue spironolactone 25 mg daily  - continue torsemide 40 mg daily (may need dose reduction if SCr elevated)  - continue Toprol XL 25 mg daily  - return to [facility] 1 more visit to see if we can titrate GDMT and re-discuss referral to the [facility]    2. CAD - h/o large anterior MI 2013 treated w/ DES to LAD  - denies CP  - on statin + ? blocker - no ASA given chronic coumadin    3. Mural Thrombus  - On coumadin, INR followed by coumadin clinic   - denies gross bleeding, check CBC today w/ low BP   4. H/o VT - likely scar mediated - s/p MDT ICD, device interrogation today shows no VT episodes   5. CKD IIIa - check BMP today  - on Farxiga  10 mg    Referred to HFSW (PCP, Medications, Transportation, ETOH Abuse, Drug Abuse, Insurance, Financial ):  No Refer to Pharmacy:  No Refer to Home Health:  No Refer to  Advanced Heart Failure Clinic: Pending, re-discuss at f/u [facility] visit   Refer to General Cardiology: followed by Dr. Tresa Endo Dr. Royann Shivers    F/u in [facility] clinic in 2 wks   Estefano Victory PA-C  11:19 AM

## 2022-11-21 NOTE — Patient Instructions (Signed)
It was great to see you today! No medication changes are needed at this time.  Labs today We will only contact you if something comes back abnormal or we need to make some changes. Otherwise no news is good news!  Your physician recommends that you schedule a follow-up appointment in: 2-3 weeks

## 2022-11-22 ENCOUNTER — Other Ambulatory Visit (HOSPITAL_COMMUNITY): Payer: Self-pay

## 2022-11-22 ENCOUNTER — Other Ambulatory Visit: Payer: Self-pay

## 2022-11-23 ENCOUNTER — Ambulatory Visit: Payer: 59

## 2022-11-24 ENCOUNTER — Ambulatory Visit: Payer: 59 | Attending: Cardiology

## 2022-11-24 DIAGNOSIS — Z7901 Long term (current) use of anticoagulants: Secondary | ICD-10-CM

## 2022-11-24 DIAGNOSIS — I513 Intracardiac thrombosis, not elsewhere classified: Secondary | ICD-10-CM

## 2022-11-24 LAB — POCT INR: INR: 1.6 — AB (ref 2.0–3.0)

## 2022-11-24 NOTE — Patient Instructions (Signed)
TAKE ANOTHER 0.5 TODAY ONLY THEN Continue taking warfarin 1 tablet daily except for 1/2 tablet on Mondays, Wednesdays and Fridays. Recheck INR in 4 weeks.  Coumadin Clinic [phone]

## 2022-12-04 NOTE — Progress Notes (Signed)
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  He presented after a three-hour delay to the hospital and was taken emergently for cath with DES to LAD.    Followed by Dr Tresa Endo and Dr Royann Shivers for many years.    Last echo 11/23 EF 20-25%, RV moderately reduced, + mural thrombus    Recent admit 4/24 for a/c CHF. BNP elevated at 2,147. High-sensitivity troponin 43 >> 123 consistent with demand ischemia. EKG showed sinus tachycardia, rate 124 bpm, with PVCs and anterior infarct pattern. Chest x-ray showed diffuse interstitial and airspace opacities (left > right) and left pleural effusion. He was diuresed from 177>>154 lb, then transitioned to PO torsemide, 40 mg daily. Referred to TOC at discharge.   He was seen in HF [facility] clinic 11/20/21. Diuretics increased 60 mg twice a day x 2 day.    Today he returns for HF follow up. Overall feeling fine. Denies SOB/PND/Orthopnea. Appetite ok. No fever or chills. Weight at home 170 pounds. Taking all medications       Cardiac Testing  04/2022 Echo EF 20-25% RV moderately reduced, , mural thrombos.   2021 LVEF 20-25% RV normal   Cath 2012. DES to LAD       ROS: All systems negative except as listed in HPI, PMH and Problem List.  SH:  Social History   Socioeconomic History   Marital status: Single    Spouse name: Not on file   Number of children: 1   Years of education: Not on file   Highest education level: Bachelor's degree (e.g., BA, AB, BS)  Occupational History   Occupation: works on Systems analyst  Tobacco Use   Smoking status: Former    Types: Cigarettes, Cigars    Quit date: 2012     Years since quitting: 12.4   Smokeless tobacco: Never  Vaping Use   Vaping Use: Never used  Substance and Sexual Activity   Alcohol use: Yes    Alcohol/week: 2.0 - 3.0 standard drinks of alcohol    Types: 2 - 3 drink(s) per week   Drug use: No   Sexual activity: Not Currently  Other Topics Concern   Not on file  Social History Narrative   Not on file   Social Determinants of Health   Financial Resource Strain: Low Risk  (05/08/2022)   Overall Financial Resource Strain (CARDIA)    Difficulty of Paying Living Expenses: Not hard at all  Food Insecurity: No Food Insecurity (10/16/2022)   Hunger Vital Sign    Worried About Running Out of Food in the Last Year: Never true    Ran Out of Food in the Last Year: Never true  Transportation Needs: No Transportation Needs (10/16/2022)   PRAPARE - Administrator, Civil Service (Medical): No    Lack of Transportation (Non-Medical): No  Physical Activity: Not on file  Stress: Not on file  Social Connections: Not on file  Intimate Partner Violence: Not At Risk (10/16/2022)   Humiliation, Afraid, Rape, and Kick questionnaire    Fear of Current or Ex-Partner: No    Emotionally Abused: No    Physically  Abused: No    Sexually Abused: No    FH: No family history on file.  Past Medical History:  Diagnosis Date   Acute MI, anterior wall (HCC)    AICD (automatic cardioverter/defibrillator) present    CAD (coronary artery disease)    2D ECHO, 07/13/2011 - EF <25%, LV moderatelty dilated, LA moderately dilatedLEXISCAN, 12/14/2011 - moderate-severe perfusion defect seen in the basal anteroseptal, mid anterior, apicacl anterior, apical, apical inferior, and apical lateral regions, post-stress EF 25%, new EKG changes from baseline abnormalities   Cancer [facility])    Prostate   CHF (congestive heart failure) (HCC) 2012   Hypertension 08/08/2021   Inguinal hernia, left    Pneumonia    November 2023   Pre-diabetes     Current Outpatient  Medications  Medication Sig Dispense Refill   acetaminophen (TYLENOL) 500 MG tablet Take 1,000 mg by mouth every 6 (six) hours as needed for mild pain.     atorvastatin (LIPITOR) 80 MG tablet Take 1 tablet (80 mg total) by mouth daily. 30 tablet 3   Cholecalciferol (VITAMIN D) 125 MCG (5000 UT) CAPS Take 5,000 Units by mouth daily.     dapagliflozin propanediol (FARXIGA) 10 MG TABS tablet Take 1 tablet (10 mg total) by mouth daily. 30 tablet 3   metoprolol succinate (TOPROL-XL) 25 MG 24 hr tablet Take 1 tablet (25 mg total) by mouth daily. 30 tablet 2   Multiple Vitamin (MULTIVITAMIN) tablet Take 1 tablet by mouth daily.     nitroGLYCERIN (NITROSTAT) 0.4 MG SL tablet Place 1 tablet (0.4 mg total) under the tongue every 5 (five) minutes as needed. Usual dose is q 5 minutes x 3 doses. (Patient not taking: Reported on 11/21/2022) 75 tablet 0   potassium chloride SA (KLOR-CON M) 20 MEQ tablet Take 1 tablet (20 mEq total) by mouth daily. 30 tablet 2   sacubitril-valsartan (ENTRESTO) 24-26 MG Take 1 tablet by mouth 2 (two) times daily. 60 tablet 3   spironolactone (ALDACTONE) 25 MG tablet Take 0.5 tablets (12.5 mg total) by mouth every other day. TAKE EOD ON EVEN DAYS 15 tablet 6   tamsulosin (FLOMAX) 0.4 MG CAPS capsule Take 0.4 mg by mouth at bedtime.     torsemide (DEMADEX) 20 MG tablet Take 2 tablets (40 mg total) by mouth daily. 60 tablet 2   warfarin (COUMADIN) 5 MG tablet Take 1 tablet daily except 1/2 tablet on Wednesdays and Fridays or as directed. (Patient taking differently: Take 2-5 mg by mouth See admin instructions. Take 1 tablet daily except 1/2 tablet on Monday, Wednesdays and Fridays or as directed.) 90 tablet 1   ZENPEP 40000-126000 units CPEP Take 1 capsule by mouth 3 (three) times daily with meals.     No current facility-administered medications for this visit.    There were no vitals filed for this visit.  PHYSICAL EXAM:  General:  Well appearing. No resp difficulty HEENT:  normal Neck: supple. JVP flat. Carotids 2+ bilaterally; no bruits. No lymphadenopathy or thryomegaly appreciated. Cor: PMI normal. Regular rate & rhythm. No rubs, gallops or murmurs. Lungs: clear Abdomen: soft, nontender, nondistended. No hepatosplenomegaly. No bruits or masses. Good bowel sounds. Extremities: no cyanosis, clubbing, rash, edema Neuro: alert & orientedx3, cranial nerves grossly intact. Moves all 4 extremities w/o difficulty. Affect pleasant.      ASSESSMENT & PLAN:  1. Chronic Systolic Heart Failure/ Ischemic CM  - EF has been down for many years, due to ICM, H/o large anterior MI due to  LAD infarct  - H/o VT, s/p Medtroinc ICD - Most recent ECHO 04/2022 EF <20%, with mod RV dysfunction  -  Referral to the [facility] was discussed previous TOC visit in 04/2022 and he was undecided if he would want any advanced therapies (ie VAD).  -NYHA  - continue Farxiga 10 mg daily  - continue Entresto 24-26 mg bid - continue spironolactone 25 mg daily  - continue torsemide 40 mg daily (may need dose reduction if SCr elevated)  - continue Toprol XL 25 mg daily  - return to [facility] 1 more visit to see if we can titrate GDMT and re-discuss referral to the [facility]      2. CAD - h/o large anterior MI 2013 treated w/ DES to LAD  - denies CP  - on statin + ? blocker - no ASA given chronic coumadin     3. Mural Thrombus  - On coumadin, INR followed by coumadin clinic   - denies gross bleeding, check CBC today w/ low BP    4. H/o VT - likely scar mediated - s/p MDT ICD, device interrogation today shows no VT episodes    5. CKD IIIa - check BMP today  - on Farxiga 10 mg           Follow up

## 2022-12-05 ENCOUNTER — Ambulatory Visit: Payer: 59 | Admitting: Student

## 2022-12-05 ENCOUNTER — Ambulatory Visit (HOSPITAL_COMMUNITY)
Admission: RE | Admit: 2022-12-05 | Discharge: 2022-12-05 | Disposition: A | Discharge status: Home / Self Care | Payer: 59 | Source: Ambulatory Visit | Attending: Adult Health | Admitting: Adult Health

## 2022-12-05 ENCOUNTER — Encounter (HOSPITAL_COMMUNITY): Payer: Self-pay

## 2022-12-05 VITALS — BP 92/64 | HR 66 | Wt 154.4 lb

## 2022-12-05 DIAGNOSIS — E785 Hyperlipidemia, unspecified: Secondary | ICD-10-CM | POA: Insufficient documentation

## 2022-12-05 DIAGNOSIS — N1831 Chronic kidney disease, stage 3a: Secondary | ICD-10-CM | POA: Diagnosis not present

## 2022-12-05 DIAGNOSIS — I252 Old myocardial infarction: Secondary | ICD-10-CM | POA: Diagnosis not present

## 2022-12-05 DIAGNOSIS — I5042 Chronic combined systolic (congestive) and diastolic (congestive) heart failure: Secondary | ICD-10-CM | POA: Diagnosis not present

## 2022-12-05 DIAGNOSIS — I255 Ischemic cardiomyopathy: Secondary | ICD-10-CM | POA: Diagnosis not present

## 2022-12-05 DIAGNOSIS — I5022 Chronic systolic (congestive) heart failure: Secondary | ICD-10-CM | POA: Diagnosis not present

## 2022-12-05 DIAGNOSIS — Z79899 Other long term (current) drug therapy: Secondary | ICD-10-CM | POA: Insufficient documentation

## 2022-12-05 DIAGNOSIS — Z955 Presence of coronary angioplasty implant and graft: Secondary | ICD-10-CM | POA: Insufficient documentation

## 2022-12-05 DIAGNOSIS — I2510 Atherosclerotic heart disease of native coronary artery without angina pectoris: Secondary | ICD-10-CM | POA: Insufficient documentation

## 2022-12-05 DIAGNOSIS — Z7901 Long term (current) use of anticoagulants: Secondary | ICD-10-CM | POA: Insufficient documentation

## 2022-12-05 DIAGNOSIS — I25118 Atherosclerotic heart disease of native coronary artery with other forms of angina pectoris: Secondary | ICD-10-CM | POA: Diagnosis not present

## 2022-12-05 DIAGNOSIS — I513 Intracardiac thrombosis, not elsewhere classified: Secondary | ICD-10-CM | POA: Diagnosis not present

## 2022-12-05 DIAGNOSIS — I130 Hypertensive heart and chronic kidney disease with heart failure and stage 1 through stage 4 chronic kidney disease, or unspecified chronic kidney disease: Secondary | ICD-10-CM | POA: Diagnosis not present

## 2022-12-05 LAB — BASIC METABOLIC PANEL
Anion gap: 9 (ref 5–15)
BUN: 31 mg/dL — ABNORMAL HIGH (ref 8–23)
CO2: 26 mmol/L (ref 22–32)
Calcium: 9.2 mg/dL (ref 8.9–10.3)
Chloride: 98 mmol/L (ref 98–111)
Creatinine, Ser: 1.68 mg/dL — ABNORMAL HIGH (ref 0.61–1.24)
GFR, Estimated: 44 mL/min — ABNORMAL LOW (ref >60)
Glucose, Bld: 110 mg/dL — ABNORMAL HIGH (ref 70–99)
Potassium: 4.1 mmol/L (ref 3.5–5.1)
Sodium: 133 mmol/L — ABNORMAL LOW (ref 135–145)

## 2022-12-05 NOTE — Progress Notes (Signed)
ReDS Vest / Clip - 12/05/22 0800       ReDS Vest / Clip   Station Marker C    Ruler Value 22    ReDS Value Range High volume overload    ReDS Actual Value 43

## 2022-12-05 NOTE — Patient Instructions (Signed)
Labs done today. We will contact you only if your labs are abnormal.  HOLD YOUR TORSEMIDE UNTIL WE CALL YOU ABOUT YOUR LAB RESULTS.   No other medication changes were made. Please continue all current medications as prescribed.  Thank you for allowing [place] to provide your heart failure care after your recent hospitalization. Please follow-up with General Cardiology.

## 2022-12-07 ENCOUNTER — Telehealth (HOSPITAL_COMMUNITY): Payer: Self-pay | Admitting: Cardiology

## 2022-12-07 DIAGNOSIS — I5022 Chronic systolic (congestive) heart failure: Secondary | ICD-10-CM

## 2022-12-07 MED ORDER — TORSEMIDE 20 MG PO TABS: 20.0000 mg | ORAL_TABLET | Freq: Every day | ORAL | 2 refills | Status: DC

## 2022-12-07 NOTE — Telephone Encounter (Signed)
-----   Message from Sherald Hess, NP sent at 12/05/2022 12:05 PM EDT ----- Kidney function elevated. Please call instruct to hold torsemide x 2 days then start torsemide 20 mg daily. Repeat BMET in 7 days. Please call.

## 2022-12-07 NOTE — Telephone Encounter (Signed)
Patient called.  Patient aware.  

## 2022-12-12 ENCOUNTER — Other Ambulatory Visit (HOSPITAL_COMMUNITY): Payer: Self-pay

## 2022-12-13 NOTE — Progress Notes (Signed)
Remote ICD transmission.   

## 2022-12-15 ENCOUNTER — Ambulatory Visit (HOSPITAL_COMMUNITY)
Admission: RE | Admit: 2022-12-15 | Discharge: 2022-12-15 | Disposition: A | Discharge status: Home / Self Care | Payer: 59 | Source: Ambulatory Visit | Attending: Internal Medicine | Admitting: Internal Medicine

## 2022-12-15 DIAGNOSIS — I5022 Chronic systolic (congestive) heart failure: Secondary | ICD-10-CM | POA: Diagnosis present

## 2022-12-15 LAB — BASIC METABOLIC PANEL
Anion gap: 8 (ref 5–15)
BUN: 30 mg/dL — ABNORMAL HIGH (ref 8–23)
CO2: 24 mmol/L (ref 22–32)
Calcium: 9 mg/dL (ref 8.9–10.3)
Chloride: 102 mmol/L (ref 98–111)
Creatinine, Ser: 1.71 mg/dL — ABNORMAL HIGH (ref 0.61–1.24)
GFR, Estimated: 43 mL/min — ABNORMAL LOW (ref >60)
Glucose, Bld: 105 mg/dL — ABNORMAL HIGH (ref 70–99)
Potassium: 4.1 mmol/L (ref 3.5–5.1)
Sodium: 134 mmol/L — ABNORMAL LOW (ref 135–145)

## 2022-12-19 NOTE — Progress Notes (Signed)
Cardiology Office Note:    Date:  01/01/2023   ID:  ARLANDA KAUPPILA, DOB [birth year], MRN [id number]  PCP:  [name], MD  Cardiologist:  Nicki Guadalajara, MD  Electrophysiologist:  Thurmon Fair, MD   Referring MD: [name], MD   Chief Complaint: hospital follow-up of CHF  History of Present Illness:    Steven [name] is a 67 y.o. male with a history of CAD with STEMI s/p BMS to LAD in 06/2010 and angioplasty to OM1 in 11/2010, ischemic cardiomyopathy/ chronic HFrEF with EF <20 on most recent Echo in 04/2022 s/p ICD in 02/2011 (gen change in 01/2019), LV thrombus on Coumadin, non-sustained VT, hypertension, hyperlipidemia, pre-diabetes, and CKD stage III who is followed by Dr. Tresa Endo and Dr. Royann Shivers and presents today for hospital follow-up of CHF.   Patient is originally from [place]. He was admitted in 06/2010 after suffering a large out of hospital anterior wall MI. He presents after a 3 hour delay to the hospital and was taken emergently to the cath labs where he underwent successful PCI with BMS to the LAD. He later underwent angioplasty to OM1 in 11/2010 as well. He had significant LV dysfunction after his MI and initially wore a life vest and then ultimately underwent placement of ICD in 02/2011 for primary prevention of sudden cardiac death given persistent LV dysfunction. Interrogation of his device in the past has shown episodes of NSVT. Echo in 07/2013 showed LVEF of 20-25% with diffuse hypokinesis and akinesis of the entire anteroseptal myocardium and apical region. Also showed a new apical thrombus and he was started on Coumadin. In 01/2014, he was found to have a subdural hematoma after fall where he hit his head on a bar. He required burr holes for subdural hematoma evacuation. Aspirin and Coumadin were initially held but Coumadin was ultimately restarted after several weeks. He underwent an ICD generator change in 01/2019 after reaching ERI. He has had some chronic chest tightness with  exertion over the years and antianginals have been adjusted multiple times. GDMT and antianginals have been limited some by soft BP.   He was admitted in 04/2022 for acute on chronic CHF. Echo showed LVEF of <20% with global hypokinesis and an adherent mass at the inferoseptal apical portion consistent with laminated/ chronic thrombus as well as moderately reduced RV function.  He was admitted again in 09/2022 for acute on chronic CHF. BNP was elevated at 2,147. High-sensitivity troponin was elevated at 43 >> 123 consistent with demand ischemia. Chest x-ray showed diffuse interstitial and airspace opacities (left > right) and a left pleural effusion. He was diuresed 23 lbs and then transitioned to Torsemide at discharge. Discharge weight was 154 lbs. Since then he has been seen twice by the CHF team. He was most recently seen by Tonye Becket, NP, on 12/05/2022 at which time he was stable from a cardiac standpoint. Weight was stable at 152 lbs at home and he was compliant with all his medications. Optivol reading was well below threshold.   Patient presents today for follow-up. Here alone. He denies any shortness of breath, orthopnea, PND, or edema. He has noticed a little weight gain at home. Weight is 161 lbs today which is up from 154 lbs at last visit on 12/05/2022. He states weight is normally around 155 to 157 lbs on home scales but was 160 lbs this morning at home. His Torsemide was recently decreased to every other day due to worsening renal function and  he is schedule to take his Torsemide today. He describes some atypical chest pain that last a couple of seconds and then goes away. This occurs randomly and is not brought on my exertion. He states this is not new and is stable. No palpitations, lightheadedness/dizziness, or syncope.   EKGs/Labs/Other Studies Reviewed:    The following studies were reviewed:  Echocardiogram 05/09/2022: Impressions:  1. On contrast images, there is an adherent mass at  the inferoseptal  apical portion that is consistent with laminated/chronic thrombus. No  mobile aspects seen and not appreciated in other views. Left ventricular  ejection fraction, by estimation, is  <20%. The left ventricle has severely decreased function. The left  ventricle demonstrates global hypokinesis. The left ventricular internal  cavity size was moderately dilated. Left ventricular diastolic parameters  are indeterminate.   2. Right ventricular systolic function is moderately reduced. The right  ventricular size is normal. There is mildly elevated pulmonary artery  systolic pressure.   3. Left atrial size was mild to moderately dilated.   4. Right atrial size was mildly dilated.   5. The mitral valve is normal in structure. Trivial mitral valve  regurgitation. No evidence of mitral stenosis.   6. The aortic valve is grossly normal. Aortic valve regurgitation is not  visualized. No aortic stenosis is present.   7. The inferior vena cava is dilated in size with <50% respiratory  variability, suggesting right atrial pressure of 15 mmHg.   Comparison(s): Changes from prior study are noted. Prior EF 20-25%, now  <20%.   Conclusion(s)/Recommendation(s): Adherent LV thrombus seen at inferoseptal  apical segment. While patient has known prior LV thrombus, I did not see  these on echo contrast images from 2021 study. Findings communicated to  Dr. Jens Som.    EKG:  EKG not ordered today.   Recent Labs: 04/18/2022: NT-Pro BNP 587 05/07/2022: ALT 24 10/17/2022: Magnesium 2.0 11/21/2022: B Natriuretic Peptide 914.3; Hemoglobin 14.4; Platelets 170 12/29/2022: BUN 25; Creatinine, Ser 1.49; Potassium 4.1; Sodium 135  Recent Lipid Panel    Component Value Date/Time   CHOL 121 04/18/2022 0958   CHOL 161 07/01/2013 0838   TRIG 50 04/18/2022 0958   TRIG 108 07/01/2013 0838   HDL 45 04/18/2022 0958   HDL 43 07/01/2013 0838   CHOLHDL 2.7 04/18/2022 0958   CHOLHDL 3.3 09/03/2014 0803    VLDL 16 09/03/2014 0803   LDLCALC 64 04/18/2022 0958   LDLCALC 96 07/01/2013 0838    Physical Exam:    Vital Signs: BP 118/68 (BP Location: Left Arm, Patient Position: Sitting, Cuff Size: Normal)   Pulse 71   Ht 5\' 10"  (1.778 m)   Wt 161 lb 6.4 oz (73.2 kg)   SpO2 99%   BMI 23.16 kg/m     Wt Readings from Last 3 Encounters:  01/01/23 161 lb 6.4 oz (73.2 kg)  12/05/22 154 lb 6.4 oz (70 kg)  11/21/22 154 lb (69.9 kg)     General: 67 y.o. thin African-American male in no acute distress. HEENT: Normocephalic and atraumatic. Sclera clear.  Neck: Supple. No carotid bruits. No JVD. Heart:  RRR. Distinct S1 and S2. No murmurs, gallops, or rubs.  Lungs: No increased work of breathing. Clear to ausculation bilaterally. No wheezes, rhonchi, or rales.  Abdomen: Soft, non-distended, and non-tender to palpation.  Extremities: No lower extremity edema.    Skin: Warm and dry. Neuro: Alert and oriented x3. No focal deficits. Psych: Normal affect. Responds appropriately.  Assessment:  1. Coronary artery disease involving native coronary artery of native heart without angina pectoris   2. Ischemic cardiomyopathy   3. Chronic HFrEF (heart failure with reduced ejection fraction) (HCC)   4. LV (left ventricular) mural thrombus   5. S/P ICD (internal cardiac defibrillator) procedure   6. NSVT (nonsustained ventricular tachycardia) (HCC)   7. Primary hypertension   8. Hyperlipidemia, unspecified hyperlipidemia type   9. Stage 3 chronic kidney disease, unspecified whether stage 3a or 3b CKD (HCC)     Plan:    CAD History of anterior STEMI in 06/2010 s/p BMS to LAD and then subsequent angioplasty to OM in 11/2010. Myoview in 2013 showed evidence of prior infarct but no significant reversible defect suggestive of ischemia.  - He describes very atypical chest pain which is not new. Does not sound cardiac in nature. - No aspirin due to need for full anticoagulation with Coumadin. - Continue  beta-blocker and statin.  Ischemic Cardiomyopathy Chronic HFrEF Last Echo in 04/2022 showed LVEF of <20% with global hypokinesis and moderately reduced RV function. - Weight is up 7 lbs from last visit on 12/05/2022. However, he does not look volume overloaded on exam.  - Currently on Torsemide 20mg  every other day (and KCl 20 mEq daily). Torsemide was decreased from daily to every other day due to worsening renal function. Continue every other day dosing but advised patient he can take an extra dose as needed for weight gain (3lbs in 1 day or 5lbs in 1 week). He is scheduled to take Torsemide today so advised him to take an extra dose tomorrow if weight does not come down. - Continue Entresto 24-26mg  twice daily. - Continue Toprol-XL 25mg  daily.  - Continue Spironolactone 12.5mg  every other day. - Continue Farxiga 10mg  daily.  - Continue daily weights and sodium/ fluid restrictions.  LV Thrombus Initially seen on Echo in 2015. Last Echo in 04/2022 showed an adherent mass at the inferoseptal apical portion consistent with laminated/ chronic thrombus.  - Continue chronic anticoagulation with Coumadin. Followed by our Coumadin Clinic.  S/p ICD S/p ICD in 2012 for primary prevention of sudden cardiac death given ischemic cardiomyopathy. Underwent gen change in 2020. - Followed by Dr. Royann Shivers.  Non-Sustained VT Noted on device interrogations in the past.  - No palpitations.  - Continue Toprol-XL 25mg  daily.  Hypertension BP well controlled.  - Continue medications for CHF as above.  Hyperlipidemia Lipid panel in 03/2022: Total Cholesterol 121, Triglycerides 50, HDL 45, LDL 64. - Continue Lipitor 80mg  daily.  CKD Stage III Creatinine baseline around 1.3 to 1.7. Stable at 1.49 on recent labs on 12/29/2022 (improved after decreasing Torsemide to every other day).  Disposition: Follow up in 3 months.   Medication Adjustments/Labs and Tests Ordered: Current medicines are reviewed at  length with the patient today.  Concerns regarding medicines are outlined above.  No orders of the defined types were placed in this encounter.  No orders of the defined types were placed in this encounter.   Patient Instructions  Medication Instructions:  You may take an extra torsemide 20mg  as needed for fluid weight gain of 3lbs in one day or 5lbs in one week. *If you need a refill on your cardiac medications before your next appointment, please call your pharmacy*   Lab Work: None ordered If you have labs (blood work) drawn today and your tests are completely normal, you will receive your results only by: MyChart Message (if you have MyChart) OR A paper copy  in the mail If you have any lab test that is abnormal or we need to change your treatment, we will call you to review the results.   Testing/Procedures: None ordered   Follow-Up: At [facility], you and your health needs are our priority.  As part of our continuing mission to provide you with exceptional heart care, we have created designated Provider Care Teams.  These Care Teams include your primary Cardiologist (physician) and Advanced Practice Providers (APPs -  Physician Assistants and Nurse Practitioners) who all work together to provide you with the care you need, when you need it.  We recommend signing up for the patient portal called "MyChart".  Sign up information is provided on this After Visit Summary.  MyChart is used to connect with patients for Virtual Visits (Telemedicine).  Patients are able to view lab/test results, encounter notes, upcoming appointments, etc.  Non-urgent messages can be sent to your provider as well.   To learn more about what you can do with MyChart, go to [email or web address].    Your next appointment:   3 month(s)  Provider:   Nicki Guadalajara, MD     Other Instructions Heart Failure Education: Weigh yourself EVERY morning after you go to the bathroom but before you eat or  drink anything. Write this number down in a weight log/diary. If you gain 3 pounds overnight or 5 pounds in a week, call the office. Take your medicines as prescribed. If you have concerns about your medications, please call us before you stop taking them.  Eat low salt foods--Limit salt (sodium) to 2000 mg per day. This will help prevent your body from holding onto fluid. Read food labels as many processed foods have a lot of sodium, especially canned goods and prepackaged meats. If you would like some assistance choosing low sodium foods, we would be happy to set you up with a nutritionist. Limit all fluids for the day to less than 2 liters (64 ounces). Fluid includes all drinks, coffee, juice, ice chips, soup, jello, and all other liquids. Stay as active as you can everyday. Staying active will give you more energy and make your muscles stronger. Start with 5 minutes at a time and work your way up to 30 minutes a day. Break up your activities--do some in the morning and some in the afternoon. Start with 3 days per week and work your way up to 5 days as you can.  If you have chest pain, feel short of breath, dizzy, or lightheaded, STOP. If you don't feel better after a short rest, call 911. If you do feel better, call the office to let us know you have symptoms with exercise.     Signed, Corrin Parker, PA-C  01/01/2023 12:20 PM    [facility]

## 2022-12-21 ENCOUNTER — Encounter: Payer: Self-pay | Admitting: Cardiovascular Disease

## 2022-12-22 ENCOUNTER — Ambulatory Visit: Payer: 59 | Attending: Cardiovascular Disease

## 2022-12-22 ENCOUNTER — Telehealth (HOSPITAL_COMMUNITY): Payer: Self-pay

## 2022-12-22 DIAGNOSIS — Z7901 Long term (current) use of anticoagulants: Secondary | ICD-10-CM

## 2022-12-22 DIAGNOSIS — I513 Intracardiac thrombosis, not elsewhere classified: Secondary | ICD-10-CM | POA: Diagnosis not present

## 2022-12-22 DIAGNOSIS — I5042 Chronic combined systolic (congestive) and diastolic (congestive) heart failure: Secondary | ICD-10-CM

## 2022-12-22 LAB — POCT INR: INR: 1.7 — AB (ref 2.0–3.0)

## 2022-12-22 MED ORDER — TORSEMIDE 20 MG PO TABS: ORAL_TABLET | ORAL | 3 refills | Status: DC

## 2022-12-22 NOTE — Telephone Encounter (Signed)
-----   Message from Sherald Hess, NP sent at 12/21/2022  8:56 AM EDT ----- Please call and instruct to take torsemide 20 mg every other day. Repeat BMET in 7 days

## 2022-12-22 NOTE — Telephone Encounter (Signed)
Patient's med list has been changed and updated. Lab order placed and appointment scheduled. Pt aware, agreeable, and verbalized understanding.

## 2022-12-22 NOTE — Patient Instructions (Addendum)
Description   TAKE ANOTHER 0.5 TODAY ONLY THEN START taking warfarin 1 tablet daily except for 1/2 tablet on Mondays and Fridays.  Recheck INR in 2 weeks.   Coumadin Clinic [phone]

## 2022-12-29 ENCOUNTER — Ambulatory Visit (HOSPITAL_COMMUNITY)
Admission: RE | Admit: 2022-12-29 | Discharge: 2022-12-29 | Disposition: A | Discharge status: Home / Self Care | Payer: 59 | Source: Ambulatory Visit | Attending: Cardiology | Admitting: Cardiology

## 2022-12-29 DIAGNOSIS — I5042 Chronic combined systolic (congestive) and diastolic (congestive) heart failure: Secondary | ICD-10-CM | POA: Insufficient documentation

## 2022-12-29 LAB — BASIC METABOLIC PANEL
Anion gap: 9 (ref 5–15)
BUN: 25 mg/dL — ABNORMAL HIGH (ref 8–23)
CO2: 21 mmol/L — ABNORMAL LOW (ref 22–32)
Calcium: 8.6 mg/dL — ABNORMAL LOW (ref 8.9–10.3)
Chloride: 105 mmol/L (ref 98–111)
Creatinine, Ser: 1.49 mg/dL — ABNORMAL HIGH (ref 0.61–1.24)
GFR, Estimated: 51 mL/min — ABNORMAL LOW (ref >60)
Glucose, Bld: 87 mg/dL (ref 70–99)
Potassium: 4.1 mmol/L (ref 3.5–5.1)
Sodium: 135 mmol/L (ref 135–145)

## 2023-01-01 ENCOUNTER — Ambulatory Visit (INDEPENDENT_AMBULATORY_CARE_PROVIDER_SITE_OTHER): Payer: 59

## 2023-01-01 ENCOUNTER — Encounter: Payer: Self-pay | Admitting: Student

## 2023-01-01 ENCOUNTER — Other Ambulatory Visit (HOSPITAL_COMMUNITY): Payer: Self-pay

## 2023-01-01 ENCOUNTER — Ambulatory Visit: Payer: 59 | Attending: Student | Admitting: Student

## 2023-01-01 ENCOUNTER — Other Ambulatory Visit (HOSPITAL_COMMUNITY): Payer: Self-pay | Admitting: Cardiology

## 2023-01-01 VITALS — BP 118/68 | HR 71 | Ht 70.0 in | Wt 161.4 lb

## 2023-01-01 DIAGNOSIS — N183 Chronic kidney disease, stage 3 unspecified: Secondary | ICD-10-CM

## 2023-01-01 DIAGNOSIS — I513 Intracardiac thrombosis, not elsewhere classified: Secondary | ICD-10-CM | POA: Diagnosis not present

## 2023-01-01 DIAGNOSIS — I5022 Chronic systolic (congestive) heart failure: Secondary | ICD-10-CM

## 2023-01-01 DIAGNOSIS — I255 Ischemic cardiomyopathy: Secondary | ICD-10-CM | POA: Diagnosis not present

## 2023-01-01 DIAGNOSIS — Z7901 Long term (current) use of anticoagulants: Secondary | ICD-10-CM

## 2023-01-01 DIAGNOSIS — E785 Hyperlipidemia, unspecified: Secondary | ICD-10-CM

## 2023-01-01 DIAGNOSIS — I2510 Atherosclerotic heart disease of native coronary artery without angina pectoris: Secondary | ICD-10-CM

## 2023-01-01 DIAGNOSIS — I10 Essential (primary) hypertension: Secondary | ICD-10-CM

## 2023-01-01 DIAGNOSIS — I4729 Other ventricular tachycardia: Secondary | ICD-10-CM

## 2023-01-01 DIAGNOSIS — Z95810 Presence of automatic (implantable) cardiac defibrillator: Secondary | ICD-10-CM

## 2023-01-01 LAB — POCT INR: INR: 1.9 — AB (ref 2.0–3.0)

## 2023-01-01 MED ORDER — ATORVASTATIN CALCIUM 80 MG PO TABS
80.0000 mg | ORAL_TABLET | Freq: Every day | ORAL | 3 refills | Status: DC
  Filled 2023-01-01: qty 30, 30d supply, fill #0
  Filled 2023-01-26: qty 90, 90d supply, fill #0

## 2023-01-01 NOTE — Patient Instructions (Signed)
Medication Instructions:  You may take an extra torsemide 20mg  as needed for fluid weight gain of 3lbs in one day or 5lbs in one week. *If you need a refill on your cardiac medications before your next appointment, please call your pharmacy*   Lab Work: None ordered If you have labs (blood work) drawn today and your tests are completely normal, you will receive your results only by: MyChart Message (if you have MyChart) OR A paper copy in the mail If you have any lab test that is abnormal or we need to change your treatment, we will call you to review the results.   Testing/Procedures: None ordered   Follow-Up: At [facility], you and your health needs are our priority.  As part of our continuing mission to provide you with exceptional heart care, we have created designated Provider Care Teams.  These Care Teams include your primary Cardiologist (physician) and Advanced Practice Providers (APPs -  Physician Assistants and Nurse Practitioners) who all work together to provide you with the care you need, when you need it.  We recommend signing up for the patient portal called "MyChart".  Sign up information is provided on this After Visit Summary.  MyChart is used to connect with patients for Virtual Visits (Telemedicine).  Patients are able to view lab/test results, encounter notes, upcoming appointments, etc.  Non-urgent messages can be sent to your provider as well.   To learn more about what you can do with MyChart, go to [email or web address].    Your next appointment:   3 month(s)  Provider:   Nicki Guadalajara, MD     Other Instructions Heart Failure Education: Weigh yourself EVERY morning after you go to the bathroom but before you eat or drink anything. Write this number down in a weight log/diary. If you gain 3 pounds overnight or 5 pounds in a week, call the office. Take your medicines as prescribed. If you have concerns about your medications, please call us before  you stop taking them.  Eat low salt foods--Limit salt (sodium) to 2000 mg per day. This will help prevent your body from holding onto fluid. Read food labels as many processed foods have a lot of sodium, especially canned goods and prepackaged meats. If you would like some assistance choosing low sodium foods, we would be happy to set you up with a nutritionist. Limit all fluids for the day to less than 2 liters (64 ounces). Fluid includes all drinks, coffee, juice, ice chips, soup, jello, and all other liquids. Stay as active as you can everyday. Staying active will give you more energy and make your muscles stronger. Start with 5 minutes at a time and work your way up to 30 minutes a day. Break up your activities--do some in the morning and some in the afternoon. Start with 3 days per week and work your way up to 5 days as you can.  If you have chest pain, feel short of breath, dizzy, or lightheaded, STOP. If you don't feel better after a short rest, call 911. If you do feel better, call the office to let us know you have symptoms with exercise.

## 2023-01-01 NOTE — Patient Instructions (Signed)
TAKE 1.5 TABLETS TODAY ONLY THEN CONTINUE taking warfarin 1 tablet daily except for 1/2 tablet on Mondays and Fridays.  Recheck INR in 3 weeks.   Coumadin Clinic [phone]

## 2023-01-02 ENCOUNTER — Other Ambulatory Visit (HOSPITAL_COMMUNITY): Payer: Self-pay

## 2023-01-11 ENCOUNTER — Other Ambulatory Visit: Payer: Self-pay

## 2023-01-11 ENCOUNTER — Other Ambulatory Visit: Payer: Self-pay | Admitting: Student

## 2023-01-11 MED ORDER — POTASSIUM CHLORIDE CRYS ER 20 MEQ PO TBCR
20.0000 meq | EXTENDED_RELEASE_TABLET | Freq: Every day | ORAL | 2 refills | Status: DC
  Filled 2023-01-11: qty 30, 30d supply, fill #0
  Filled 2023-02-12: qty 30, 30d supply, fill #1

## 2023-01-11 MED ORDER — METOPROLOL SUCCINATE ER 25 MG PO TB24
25.0000 mg | ORAL_TABLET | Freq: Every day | ORAL | 2 refills | Status: DC
  Filled 2023-01-11: qty 30, 30d supply, fill #0
  Filled 2023-02-12: qty 30, 30d supply, fill #1

## 2023-01-23 ENCOUNTER — Ambulatory Visit: Payer: 59 | Attending: Cardiovascular Disease | Admitting: *Deleted

## 2023-01-23 DIAGNOSIS — I513 Intracardiac thrombosis, not elsewhere classified: Secondary | ICD-10-CM

## 2023-01-23 DIAGNOSIS — Z7901 Long term (current) use of anticoagulants: Secondary | ICD-10-CM

## 2023-01-23 LAB — POCT INR: INR: 1.7 — AB (ref 2.0–3.0)

## 2023-01-23 NOTE — Patient Instructions (Signed)
Description   TAKE 1.5 TABLETS TODAY ONLY THEN START taking warfarin 1 tablet daily except for 1/2 tablet on Mondays.  Recheck INR in 3 weeks.   Coumadin Clinic [phone]

## 2023-01-26 ENCOUNTER — Other Ambulatory Visit: Payer: Self-pay

## 2023-02-01 ENCOUNTER — Other Ambulatory Visit: Payer: Self-pay

## 2023-02-01 ENCOUNTER — Other Ambulatory Visit (HOSPITAL_COMMUNITY): Payer: Self-pay

## 2023-02-01 ENCOUNTER — Encounter (HOSPITAL_COMMUNITY): Payer: Self-pay

## 2023-02-01 ENCOUNTER — Emergency Department (HOSPITAL_COMMUNITY)
Admission: EM | Admit: 2023-02-01 | Discharge: 2023-02-01 | Disposition: A | Discharge status: Home / Self Care | Payer: 59 | Attending: Emergency Medicine | Admitting: Emergency Medicine

## 2023-02-01 ENCOUNTER — Telehealth: Payer: Self-pay

## 2023-02-01 ENCOUNTER — Emergency Department (HOSPITAL_COMMUNITY): Payer: 59

## 2023-02-01 DIAGNOSIS — R7989 Other specified abnormal findings of blood chemistry: Secondary | ICD-10-CM | POA: Insufficient documentation

## 2023-02-01 DIAGNOSIS — I110 Hypertensive heart disease with heart failure: Secondary | ICD-10-CM | POA: Insufficient documentation

## 2023-02-01 DIAGNOSIS — I2510 Atherosclerotic heart disease of native coronary artery without angina pectoris: Secondary | ICD-10-CM | POA: Insufficient documentation

## 2023-02-01 DIAGNOSIS — Z87891 Personal history of nicotine dependence: Secondary | ICD-10-CM | POA: Insufficient documentation

## 2023-02-01 DIAGNOSIS — Z8546 Personal history of malignant neoplasm of prostate: Secondary | ICD-10-CM | POA: Diagnosis not present

## 2023-02-01 DIAGNOSIS — Z7901 Long term (current) use of anticoagulants: Secondary | ICD-10-CM | POA: Diagnosis not present

## 2023-02-01 DIAGNOSIS — Z79899 Other long term (current) drug therapy: Secondary | ICD-10-CM | POA: Insufficient documentation

## 2023-02-01 DIAGNOSIS — R55 Syncope and collapse: Secondary | ICD-10-CM | POA: Diagnosis not present

## 2023-02-01 DIAGNOSIS — I509 Heart failure, unspecified: Secondary | ICD-10-CM | POA: Diagnosis not present

## 2023-02-01 DIAGNOSIS — I472 Ventricular tachycardia, unspecified: Secondary | ICD-10-CM | POA: Diagnosis not present

## 2023-02-01 LAB — BASIC METABOLIC PANEL
Anion gap: 11 (ref 5–15)
BUN: 23 mg/dL (ref 8–23)
CO2: 23 mmol/L (ref 22–32)
Calcium: 9 mg/dL (ref 8.9–10.3)
Chloride: 101 mmol/L (ref 98–111)
Creatinine, Ser: 1.59 mg/dL — ABNORMAL HIGH (ref 0.61–1.24)
GFR, Estimated: 47 mL/min — ABNORMAL LOW (ref >60)
Glucose, Bld: 96 mg/dL (ref 70–99)
Potassium: 4.2 mmol/L (ref 3.5–5.1)
Sodium: 135 mmol/L (ref 135–145)

## 2023-02-01 LAB — PROTIME-INR
INR: 2 — ABNORMAL HIGH (ref 0.8–1.2)
Prothrombin Time: 22.5 seconds — ABNORMAL HIGH (ref 11.4–15.2)

## 2023-02-01 LAB — CBC WITH DIFFERENTIAL/PLATELET
Abs Immature Granulocytes: 0.01 10*3/uL (ref 0.00–0.07)
Basophils Absolute: 0 10*3/uL (ref 0.0–0.1)
Basophils Relative: 1 %
Eosinophils Absolute: 0.2 10*3/uL (ref 0.0–0.5)
Eosinophils Relative: 3 %
HCT: 45 % (ref 39.0–52.0)
Hemoglobin: 14.7 g/dL (ref 13.0–17.0)
Immature Granulocytes: 0 %
Lymphocytes Relative: 19 %
Lymphs Abs: 1.1 10*3/uL (ref 0.7–4.0)
MCH: 30.5 pg (ref 26.0–34.0)
MCHC: 32.7 g/dL (ref 30.0–36.0)
MCV: 93.4 fL (ref 80.0–100.0)
Monocytes Absolute: 0.7 10*3/uL (ref 0.1–1.0)
Monocytes Relative: 12 %
Neutro Abs: 3.7 10*3/uL (ref 1.7–7.7)
Neutrophils Relative %: 65 %
Platelets: 153 10*3/uL (ref 150–400)
RBC: 4.82 MIL/uL (ref 4.22–5.81)
RDW: 14.1 % (ref 11.5–15.5)
WBC: 5.6 10*3/uL (ref 4.0–10.5)
nRBC: 0 % (ref 0.0–0.2)

## 2023-02-01 LAB — HEPATIC FUNCTION PANEL
ALT: 16 U/L (ref 0–44)
AST: 29 U/L (ref 15–41)
Albumin: 3.8 g/dL (ref 3.5–5.0)
Alkaline Phosphatase: 61 U/L (ref 38–126)
Bilirubin, Direct: 0.3 mg/dL — ABNORMAL HIGH (ref 0.0–0.2)
Indirect Bilirubin: 0.7 mg/dL (ref 0.3–0.9)
Total Bilirubin: 1 mg/dL (ref 0.3–1.2)
Total Protein: 7.1 g/dL (ref 6.5–8.1)

## 2023-02-01 LAB — TROPONIN I (HIGH SENSITIVITY)
Troponin I (High Sensitivity): 19 ng/L — ABNORMAL HIGH (ref <18)
Troponin I (High Sensitivity): 17 ng/L (ref <18)

## 2023-02-01 LAB — CBG MONITORING, ED: Glucose-Capillary: 81 mg/dL (ref 70–99)

## 2023-02-01 MED ORDER — AMIODARONE HCL 200 MG PO TABS
200.0000 mg | ORAL_TABLET | Freq: Every day | ORAL | 5 refills | Status: DC
  Filled 2023-02-01: qty 30, 30d supply, fill #0

## 2023-02-01 MED ORDER — AMIODARONE HCL 200 MG PO TABS
400.0000 mg | ORAL_TABLET | Freq: Once | ORAL | Status: AC
  Administered 2023-02-01: 400 mg via ORAL
  Filled 2023-02-01: qty 2

## 2023-02-01 MED ORDER — AMIODARONE HCL 200 MG PO TABS
ORAL_TABLET | ORAL | 0 refills | Status: DC
  Filled 2023-02-01: qty 84, 28d supply, fill #0

## 2023-02-01 NOTE — Telephone Encounter (Signed)
Following alert received from [employer] received for VT, two burst of ATP were delivered and were unsuccessful, an ICD shock was successful in converting the arrhythmia.  Patient reports he was at work and had a syncopal event. Was asymptomatic before and after syncopal event. Denies any symptoms prior or after.  Patient reports he was in a standing position and fell onto concrete floor, was checked out by "first responders" did not go to hospital.  Patient denies any injury or pain to head, states he is almost certain he did not hit his head. OAC noted on MAR. Patient denies any dizziness, lightheaded, further syncopal, nausea or vomiting since fall. Has apt this am with PCP at 10:20 in reference to fall. Reports compliance with medications. Patient is overdue with Dr. Royann Shivers, message sent to scheduler to contact pt for apt.   Shock plan/[place] DMV driving restrictions x6 months reviewed with patient w/ verbal understanding.

## 2023-02-01 NOTE — Consult Note (Addendum)
Cardiology Consultation   Patient ID: Steven [name] MRN: [id number]; DOB: 01/03/1956  Admit date: 02/01/2023 Date of Consult: 02/01/2023  PCP:  [name], MD   [facility] Providers Cardiologist:  Nicki Guadalajara, MD  Electrophysiologist:  Thurmon Fair, MD  {   Patient Profile:   Steven [name] is a 67 y.o. male with a hx of CAD/MI/PCI 2012), ICM, chronic CHF(combined systolic/diastolic), VT, LV Thrombus on warfarin, HLD, HTN, CKD (IIIa), subdural hematoma who is being seen 02/01/2023 for the evaluation of VT/ICD therapy at the request of Dr. Rosalia Hammers.  History of Present Illness:   [name] of this year had a hospitalization for acute/chronic CHF exacerbation and diuresed, he has seen [facility] clinic in f/u as well as his attending cardiology team, last a month ago, was doing well with some recent adjustments in his diuretics again. Reportedly sone degree of chronic atypical CP.  yesterday had a syncopal event, device clinic alerted today of VT episode treated with ATP x2 that failed > HV therapy with success Battery and lead measurements OK OptiVol well below threshold  He presents today to the ER for yesterday's syncope/ICD shock  LABS K+ 4.2 BUN/Creat 23/1.59 HS Trop 19, 17 WBC 5.6 H/H 14/45 Plts 153 INR 2.0  The patient has been feeling well.  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  He was waking as coworkers were were coming up to help him.  He denies any pain anywhere, no head trauma that he knows of. He reports excellent medication compliance. He felt well enough to go back to work, but the medical team there despite normal BP/vitals insisted he go home.  He saw his PMD in f/u today and they recommended he go to the ER He was unaware of being shocked   Past Medical History:  Diagnosis Date   Acute MI, anterior wall (HCC)    AICD (automatic  cardioverter/defibrillator) present    CAD (coronary artery disease)    2D ECHO, 07/13/2011 - EF <25%, LV moderatelty dilated, LA moderately dilatedLEXISCAN, 12/14/2011 - moderate-severe perfusion defect seen in the basal anteroseptal, mid anterior, apicacl anterior, apical, apical inferior, and apical lateral regions, post-stress EF 25%, new EKG changes from baseline abnormalities   Cancer [facility])    Prostate   CHF (congestive heart failure) (HCC) 2012   Hypertension 08/08/2021   Inguinal hernia, left    Pneumonia    November 2023   Pre-diabetes     Past Surgical History:  Procedure Laterality Date   CARDIAC CATHETERIZATION  11/25/2010   Predilation balloon-Apex monorail 2x84mm, Cutting balloon-2.25x60mm, resulting in a reduction of 100% stenosis down to less than 10%   CARDIAC CATHETERIZATION  07/12/2010   LAD stented with a 3.5x11mm bare-metal Veriflex stent resulting in a reduction of 100% lesion to 0%   CARDIAC DEFIBRILLATOR PLACEMENT  03/02/2011   Medtronic Protecta XT DR, model #K025KYH, serial #[id number]   COLONOSCOPY     GOLD SEED IMPLANT N/A 07/21/2021   Procedure: GOLD SEED IMPLANT;  Surgeon: Heloise Purpura, MD;  Location: [facility] ORS;  Service: Urology;  Laterality: N/A;  NEEDS 30 MIN   HERNIA REPAIR  2018   ICD GENERATOR CHANGEOUT N/A 02/24/2019   Procedure: ICD GENERATOR CHANGEOUT;  Surgeon: Thurmon Fair, MD;  Location: [facility] INVASIVE CV LAB;  Service: Cardiovascular;  Laterality: N/A;   PACEMAKER PLACEMENT  07/14/2010  Temporary placement of pacemaker, if rhythm issue continues  need a permanent device   SPACE OAR INSTILLATION N/A 07/21/2021   Procedure: SPACE OAR INSTILLATION;  Surgeon: Heloise Purpura, MD;  Location: [facility] ORS;  Service: Urology;  Laterality: N/A;     Home Medications:  Prior to Admission medications   Medication Sig Start Date End Date Taking? Authorizing Provider  acetaminophen (TYLENOL) 500 MG tablet Take 1,000 mg by mouth every 6 (six) hours as  needed for mild pain.    [provider]  atorvastatin (LIPITOR) 80 MG tablet Take 1 tablet (80 mg total) by mouth daily. 01/01/23   Allayne Butcher, PA-C  Cholecalciferol (VITAMIN D) 125 MCG (5000 UT) CAPS Take 5,000 Units by mouth daily.    [provider]  dapagliflozin propanediol (FARXIGA) 10 MG TABS tablet Take 1 tablet (10 mg total) by mouth daily. 11/21/22   Robbie Lis M, PA-C  metoprolol succinate (TOPROL-XL) 25 MG 24 hr tablet Take 1 tablet (25 mg total) by mouth daily. 01/11/23   Lennette Bihari, MD  Multiple Vitamin (MULTIVITAMIN) tablet Take 1 tablet by mouth daily.    [provider]  nitroGLYCERIN (NITROSTAT) 0.4 MG SL tablet Place 1 tablet (0.4 mg total) under the tongue every 5 (five) minutes as needed. Usual dose is q 5 minutes x 3 doses. 07/27/21   Lennette Bihari, MD  potassium chloride SA (KLOR-CON M) 20 MEQ tablet Take 1 tablet (20 mEq total) by mouth daily. 01/11/23   Lennette Bihari, MD  sacubitril-valsartan (ENTRESTO) 24-26 MG Take 1 tablet by mouth 2 (two) times daily. 11/21/22   Allayne Butcher, PA-C  spironolactone (ALDACTONE) 25 MG tablet Take 0.5 tablets (12.5 mg total) by mouth every other day. TAKE EOD ON EVEN DAYS 04/14/22   Lennette Bihari, MD  tamsulosin (FLOMAX) 0.4 MG CAPS capsule Take 0.4 mg by mouth at bedtime.    [provider]  torsemide (DEMADEX) 20 MG tablet Take 1 tablet by mouth every other day. 12/22/22   Tonye Becket D, NP  warfarin (COUMADIN) 5 MG tablet Take 1 tablet daily except 1/2 tablet on Wednesdays and Fridays or as directed. Patient taking differently: Take 2-5 mg by mouth See admin instructions. Take 1 tablet daily except 1/2 tablet on Monday, Wednesdays and Fridays or as directed. 08/14/22   Lennette Bihari, MD  ZENPEP [phone] units CPEP Take 1 capsule by mouth 3 (three) times daily with meals. 08/09/22   [provider]    Inpatient Medications: Scheduled Meds:  Continuous  Infusions:  PRN Meds:   Allergies:   No Known Allergies  Social History:   Social History   Socioeconomic History   Marital status: Single    Spouse name: Not on file   Number of children: 1   Years of education: Not on file   Highest education level: Bachelor's degree (e.g., BA, AB, BS)  Occupational History   Occupation: works on Systems analyst  Tobacco Use   Smoking status: Former    Current packs/day: 0.00    Types: Cigarettes, Cigars    Quit date: 2012    Years since quitting: 12.6   Smokeless tobacco: Never  Vaping Use   Vaping status: Never Used  Substance and Sexual Activity   Alcohol use: Yes    Alcohol/week: 2.0 - 3.0 standard drinks of alcohol    Types: 2 - 3 drink(s) per week   Drug use: No   Sexual activity: Not Currently  Other Topics  Concern   Not on file  Social History Narrative   Not on file   Social Determinants of Health   Financial Resource Strain: Low Risk  (05/08/2022)   Overall Financial Resource Strain (CARDIA)    Difficulty of Paying Living Expenses: Not hard at all  Food Insecurity: No Food Insecurity (10/16/2022)   Hunger Vital Sign    Worried About Running Out of Food in the Last Year: Never true    Ran Out of Food in the Last Year: Never true  Transportation Needs: No Transportation Needs (10/16/2022)   PRAPARE - Administrator, Civil Service (Medical): No    Lack of Transportation (Non-Medical): No  Physical Activity: Not on file  Stress: Not on file  Social Connections: Unknown (11/08/2021)   Received from [facility]   Social Network    Social Network: Not on file  Intimate Partner Violence: Not At Risk (10/16/2022)   Humiliation, Afraid, Rape, and Kick questionnaire    Fear of Current or Ex-Partner: No    Emotionally Abused: No    Physically Abused: No    Sexually Abused: No    Family History:   History reviewed. No SCD.   ROS:  Please see the history of present illness.  All other ROS reviewed and negative.      Physical Exam/Data:   Vitals:   02/01/23 1345 02/01/23 1400 02/01/23 1405 02/01/23 1410  BP:      Pulse: 66 63 60 60  Resp: 14 18 10 17   Temp:      TempSrc:      SpO2: 100% 100% 100% 100%  Weight:      Height:       No intake or output data in the 24 hours ending 02/01/23 1524    02/01/2023   12:42 PM 01/01/2023    9:01 AM 12/05/2022    8:40 AM  Last 3 Weights  Weight (lbs) 157 lb 161 lb 6.4 oz 154 lb 6.4 oz  Weight (kg) 71.215 kg 73.211 kg 70.035 kg     Body mass index is 23.18 kg/m.  General:  Well nourished, well developed, in no acute distress HEENT: normal Neck: no JVD Vascular: No carotid bruits; Distal pulses 2+ bilaterally Cardiac:  RRR; no murmurs, gallops or rubs Lungs:  CTA b/l, no wheezing, rhonchi or rales  Abd: soft, nontender Ext: no edema Musculoskeletal:  No deformities Skin: warm and dry  Neuro:  no focal abnormalities noted Psych:  Normal affect   EKG:  The EKG was personally reviewed and demonstrates:    AP/VS 62, diffuse T changes appear similar to older EKGs, no clear/acute changes   Telemetry:  Telemetry was personally reviewed and demonstrates:   SR/A pacing, occ PVCs  Relevant CV Studies:  Echocardiogram 05/09/2022: Impressions:  1. On contrast images, there is an adherent mass at the inferoseptal  apical portion that is consistent with laminated/chronic thrombus. No  mobile aspects seen and not appreciated in other views. Left ventricular  ejection fraction, by estimation, is  <20%. The left ventricle has severely decreased function. The left  ventricle demonstrates global hypokinesis. The left ventricular internal  cavity size was moderately dilated. Left ventricular diastolic parameters  are indeterminate.   2. Right ventricular systolic function is moderately reduced. The right  ventricular size is normal. There is mildly elevated pulmonary artery  systolic pressure.   3. Left atrial size was mild to moderately dilated.   4.  Right atrial size was mildly dilated.  5. The mitral valve is normal in structure. Trivial mitral valve  regurgitation. No evidence of mitral stenosis.   6. The aortic valve is grossly normal. Aortic valve regurgitation is not  visualized. No aortic stenosis is present.   7. The inferior vena cava is dilated in size with <50% respiratory  variability, suggesting right atrial pressure of 15 mmHg.   Comparison(s): Changes from prior study are noted. Prior EF 20-25%, now  <20%.    Laboratory Data:  High Sensitivity Troponin:   Recent Labs  Lab 02/01/23 1312  TROPONINIHS 19*     Chemistry Recent Labs  Lab 02/01/23 1312  NA 135  K 4.2  CL 101  CO2 23  GLUCOSE 96  BUN 23  CREATININE 1.59*  CALCIUM 9.0  GFRNONAA 47*  ANIONGAP 11    No results for input(s): "PROT", "ALBUMIN", "AST", "ALT", "ALKPHOS", "BILITOT" in the last 168 hours. Lipids No results for input(s): "CHOL", "TRIG", "HDL", "LABVLDL", "LDLCALC", "CHOLHDL" in the last 168 hours.  Hematology Recent Labs  Lab 02/01/23 1312  WBC 5.6  RBC 4.82  HGB 14.7  HCT 45.0  MCV 93.4  MCH 30.5  MCHC 32.7  RDW 14.1  PLT 153   Thyroid No results for input(s): "TSH", "FREET4" in the last 168 hours.  BNPNo results for input(s): "BNP", "PROBNP" in the last 168 hours.  DDimer No results for input(s): "DDIMER" in the last 168 hours.   Radiology/Studies:  CT Head Wo Contrast Result Date: 02/01/2023 CLINICAL DATA:  Syncope/presyncope, cerebrovascular cause suspected EXAM: CT HEAD WITHOUT CONTRAST TECHNIQUE: Contiguous axial images were obtained from the base of the skull through the vertex without intravenous contrast. RADIATION DOSE REDUCTION: This exam was performed according to the departmental dose-optimization program which includes automated exposure control, adjustment of the mA and/or kV according to patient size and/or use of iterative reconstruction technique. COMPARISON:  None Available. FINDINGS: Brain: No evidence of  acute infarction, hemorrhage, hydrocephalus, extra-axial collection or mass lesion/mass effect. Vascular: No hyperdense vessel or unexpected calcification. Skull: Normal. Negative for fracture or focal lesion. Sinuses/Orbits: No middle ear or mastoid effusion. Paranasal sinuses are notable for trace mucosal thickening of the floor of the right maxillary sinus. Orbits are unremarkable. Other: None. IMPRESSION: No acute intracranial abnormality. Electronically Signed   By: Lorenza Cambridge M.D.   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 Royann Shivers  1 week with the Coumadin clinic given addition of amio  affect his INR  I have advised the pt of [place] law, no driving 6 months  I do not anticipate need for admission EP MD  see once out of procedure    2. CAD Low flat HS Trop Continue home meds No symptoms  3. ICM 4. Chronic CHF (combined systolic/diastolic) appears compensated by exam/device   Risk Assessment/Risk Scores:    For questions or updates, please contact [facility] Please consult [email or web address] for contact info under    Signed, Sheilah Pigeon, PA-C  02/01/2023 3:24 PM  I have seen and examined this patient with Francis Dowse.  Agree with above, note added to reflect  my findings.  Patient has a history as above.  He presented to the hospital after a syncopal episode.  Remote device interrogation shows ventricular tachycardia with 2 failed runs of ATP followed by ICD shock.  The patient does not remember the shock.  He does remember some potential dizziness prior to the episode.  He feels well and is in his normal state of health, without  complaints of chest pain or shortness of breath.  GEN: Well nourished, well developed, in no acute distress  HEENT: normal  Neck: no JVD, carotid bruits, or masses Cardiac: RRR; no edema  Respiratory:  clear to auscultation bilaterally, normal work of breathing GI: soft, nontender, nondistended, + BS MS: no deformity or atrophy  Skin: warm and dry, device site well healed Neuro:  Strength and sensation are intact Psych: euthymic mood, full affect   Ventricular tachycardia: Likely scar mediated.  VT was monomorphic.  He is feeling well currently labs without major abnormalities.   plan for amiodarone load as above.   arrange for follow-up in Coumadin clinic as well as with his primary cardiologist.  Molli Knock for discharge.  Have discussed with patient no driving for 6 months. Coronary artery disease: No current chest pain. Chronic systolic heart failure: Due to ischemic cardiomyopathy.  Appears euvolemic.   M.  MD 02/01/2023 5:34 PM

## 2023-02-01 NOTE — Discharge Instructions (Signed)
Please take amiodarone as prescribed and follow-up as discussed with cardiology Return to the emergency department if you are having any new or worsening symptoms

## 2023-02-01 NOTE — Telephone Encounter (Signed)
Patient is currently in the ED now.

## 2023-02-01 NOTE — ED Provider Triage Note (Signed)
Emergency Medicine Provider Triage Evaluation Note  Steven [name] , a 67 y.o. male  was evaluated in triage.  Pt complains of loss of consciousness.  History of MI defibrillator in place.  Patient is on Coumadin.  He denies melena.  He was at work 24 hours ago, got dizzy and then passed out.  Is unsure if he hit his head.  He denies any headache or neurologic deficits no chest pain or palpitations..  Review of Systems  Positive:  Negative:   Physical Exam  BP 111/66 (BP Location: Right Arm)   Pulse 67   Temp 97.8 F (36.6 C) (Oral)   Resp 16   Ht 5\' 9"  (1.753 m)   Wt 71.2 kg   SpO2 100%   BMI 23.18 kg/m  Gen:   Awake, no distress   Resp:  Normal effort  MSK:   Moves extremities without difficulty  Other:    Medical Decision Making  Medically screening exam initiated at 12:47 PM.  Appropriate orders placed.  Steven [name] was informed that the remainder of the evaluation will be completed by another provider, this initial triage assessment does not replace that evaluation, and the importance of remaining in the ED until their evaluation is complete.  Does not meet level 2 trach criteria.  No obvious signs of head injury.  Workup initiated   Arthor Captain, PA-C 02/01/23 1248

## 2023-02-01 NOTE — Telephone Encounter (Signed)
Just spoke with Dr. Elberta Fortis about him.  They are starting him on amiodarone.  No sense in him coming to see me tomorrow.  Will need to find a slot on the schedule for him in the next roughly 4 weeks.  Okay to overbook or put him on a DOD day.

## 2023-02-01 NOTE — ED Triage Notes (Signed)
Pt had syncopal episode yesterday, states he was "out for a few seconds"; states he is unsure if he hit his head, no obvious injury; pt on warfarin; pt has ICD; denies bloody or black tools; denies pain

## 2023-02-01 NOTE — ED Provider Notes (Addendum)
[place] EMERGENCY DEPARTMENT AT [facility] Provider Note   CSN: [id number] Arrival date & time: 02/01/23  1227     History  No chief complaint on file.   Steven [name] is a 67 y.o. male.  HPI 67 year old male history of ischemic cardiomyopathy, CAD, history of V. tach, with internal defibrillator, on chronic anticoagulation, hyperlipidemia, Kd, presents today with reports that he had a syncopal episode and fell to the ground yesterday.  He reports that he saw his primary care doctor today and was told to come to the ED and was called by cardiology that he had had arrhythmia.  He currently has no complaints.  He states he was only unconscious for a few seconds.  He then went home.  He denies chest pain, dyspnea, fever, chills, headache, head injury, or other injury      Home Medications Prior to Admission medications   Medication Sig Start Date End Date Taking? Authorizing Provider  acetaminophen (TYLENOL) 500 MG tablet Take 1,000 mg by mouth every 6 (six) hours as needed for mild pain.   Yes [provider]  amiodarone (PACERONE) 200 MG tablet Take 2 tablets (400 mg total) by mouth 2 (two) times daily for 14 days, THEN 1 tablet (200 mg total) 2 (two) times daily for 14 days. 02/01/23 03/01/23 Yes Sheilah Pigeon, PA-C  amiodarone (PACERONE) 200 MG tablet Take 1 tablet (200 mg) by mouth daily. 03/04/23  Yes Sheilah Pigeon, PA-C  atorvastatin (LIPITOR) 80 MG tablet Take 1 tablet (80 mg total) by mouth daily. 01/01/23  Yes Robbie Lis M, PA-C  Cholecalciferol (VITAMIN D) 125 MCG (5000 UT) CAPS Take 5,000 Units by mouth daily.   Yes [provider]  dapagliflozin propanediol (FARXIGA) 10 MG TABS tablet Take 1 tablet (10 mg total) by mouth daily. 11/21/22  Yes Robbie Lis M, PA-C  metoprolol succinate (TOPROL-XL) 25 MG 24 hr tablet Take 1 tablet (25 mg total) by mouth daily. 01/11/23  Yes Lennette Bihari, MD  Multiple Vitamin (MULTIVITAMIN) tablet Take  1 tablet by mouth daily.   Yes [provider]  nitroGLYCERIN (NITROSTAT) 0.4 MG SL tablet Place 1 tablet (0.4 mg total) under the tongue every 5 (five) minutes as needed. Usual dose is q 5 minutes x 3 doses. 07/27/21  Yes Lennette Bihari, MD  potassium chloride SA (KLOR-CON M) 20 MEQ tablet Take 1 tablet (20 mEq total) by mouth daily. 01/11/23  Yes Lennette Bihari, MD  sacubitril-valsartan (ENTRESTO) 24-26 MG Take 1 tablet by mouth 2 (two) times daily. 11/21/22  Yes Robbie Lis M, PA-C  spironolactone (ALDACTONE) 25 MG tablet Take 0.5 tablets (12.5 mg total) by mouth every other day. TAKE EOD ON EVEN DAYS 04/14/22  Yes Lennette Bihari, MD  tamsulosin (FLOMAX) 0.4 MG CAPS capsule Take 0.4 mg by mouth at bedtime.   Yes [provider]  torsemide (DEMADEX) 20 MG tablet Take 1 tablet by mouth every other day. 12/22/22  Yes Clegg, Amy D, NP  warfarin (COUMADIN) 5 MG tablet Take 1 tablet daily except 1/2 tablet on Wednesdays and Fridays or as directed. Patient taking differently: Take 2-5 mg by mouth See admin instructions. Take 1 tablet daily except 1/2 tablet on Monday. 08/14/22  Yes Lennette Bihari, MD  ZENPEP [phone] units CPEP Take 1 capsule by mouth 3 (three) times daily with meals. 08/09/22  Yes [provider]      Allergies    Patient has no known  allergies.    Review of Systems   Review of Systems  Physical Exam Updated Vital Signs BP (!) 110/94   Pulse (!) 59   Temp 97.8 F (36.6 C) (Oral)   Resp 15   Ht 1.753 m (5\' 9" )   Wt 71.2 kg   SpO2 100%   BMI 23.18 kg/m  Physical Exam Vitals and nursing note reviewed.  HENT:     Head: Normocephalic.     Right Ear: External ear normal.     Left Ear: External ear normal.     Mouth/Throat:     Pharynx: Oropharynx is clear.  Eyes:     Extraocular Movements: Extraocular movements intact.     Pupils: Pupils are equal, round, and reactive to light.  Cardiovascular:     Rate and Rhythm: Normal rate and  regular rhythm.     Comments: Chest wall examined and no tenderness noted over device Pulmonary:     Effort: Pulmonary effort is normal.  Abdominal:     Palpations: Abdomen is soft.  Musculoskeletal:        General: Normal range of motion.     Cervical back: Normal range of motion.  Skin:    General: Skin is warm.     Capillary Refill: Capillary refill takes less than 2 seconds.  Neurological:     General: No focal deficit present.     Mental Status: He is alert.  Psychiatric:        Mood and Affect: Mood normal.     ED Results / Procedures / Treatments   Labs (all labs ordered are listed, but only abnormal results are displayed) Labs Reviewed  BASIC METABOLIC PANEL - Abnormal; Notable for the following components:      Result Value   Creatinine, Ser 1.59 (*)    GFR, Estimated 47 (*)    All other components within normal limits  PROTIME-INR - Abnormal; Notable for the following components:   Prothrombin Time 22.5 (*)    INR 2.0 (*)    All other components within normal limits  HEPATIC FUNCTION PANEL - Abnormal; Notable for the following components:   Bilirubin, Direct 0.3 (*)    All other components within normal limits  TROPONIN I (HIGH SENSITIVITY) - Abnormal; Notable for the following components:   Troponin I (High Sensitivity) 19 (*)    All other components within normal limits  CBC WITH DIFFERENTIAL/PLATELET  CBG MONITORING, ED  TROPONIN I (HIGH SENSITIVITY)    EKG EKG Interpretation Date/Time:  Thursday February 01 2023 13:40:29 EDT Ventricular Rate:  64 PR Interval:  127 QRS Duration:  117 QT Interval:  414 QTC Calculation: 428 R Axis:   -70  Text Interpretation: ATRIAL PACED RHYTHM Incomplete RBBB and LAFB Probable anterolateral infarct, age indeterm Abnormal T, consider ischemia, lateral leads changes noted on prior ekg of 21 Nov 2022 Confirmed by Margarita Grizzle [phone]) on 02/01/2023 3:45:48 PM  Radiology CT Head Wo Contrast  Result Date:  02/01/2023 CLINICAL DATA:  Syncope/presyncope, cerebrovascular cause suspected EXAM: CT HEAD WITHOUT CONTRAST TECHNIQUE: Contiguous axial images were obtained from the base of the skull through the vertex without intravenous contrast. RADIATION DOSE REDUCTION: This exam was performed according to the departmental dose-optimization program which includes automated exposure control, adjustment of the mA and/or kV according to patient size and/or use of iterative reconstruction technique. COMPARISON:  None Available. FINDINGS: Brain: No evidence of acute infarction, hemorrhage, hydrocephalus, extra-axial collection or mass lesion/mass effect. Vascular: No hyperdense vessel or unexpected  calcification. Skull: Normal. Negative for fracture or focal lesion. Sinuses/Orbits: No middle ear or mastoid effusion. Paranasal sinuses are notable for trace mucosal thickening of the floor of the right maxillary sinus. Orbits are unremarkable. Other: None. IMPRESSION: No acute intracranial abnormality. Electronically Signed   By: Lorenza Cambridge M.D.   On: 02/01/2023 14:13    Procedures .Critical Care  Performed by: Margarita Grizzle, MD Authorized by: Margarita Grizzle, MD   Critical care provider statement:    Critical care time (minutes):  30   Critical care was time spent personally by me on the following activities:  Development of treatment plan with patient or surrogate, discussions with consultants, evaluation of patient's response to treatment, examination of patient, ordering and review of laboratory studies, ordering and review of radiographic studies, ordering and performing treatments and interventions, pulse oximetry, re-evaluation of patient's condition and review of old charts     Medications Ordered in ED Medications  amiodarone (PACERONE) tablet 400 mg (400 mg Oral Given 02/01/23 1653)    ED Course/ Medical Decision Making/ A&P Clinical Course as of 02/01/23 1715  Thu Feb 01, 2023  1421 Inhaler therapeutic  at 2 CBC reviewed interpreted within normal limits Metabolic panel reviewed interpreted creatinine elevated at 1.5  [DR]  1451 Troponin reviewed mildly elevated at 19 suspect secondary to defibrillation [DR]  1540 CT head reviewed interpreted no evidence acute abnormality noted radiologist interpretation Kirst [DR]  1650 No acute intracranial abnormality noted [DR]    Clinical Course User Index [DR] Margarita Grizzle, MD                                 Medical Decision Making  Reviewed records and noted documentation below igned     Following alert received from [employer] received for VT, two burst of ATP were delivered and were unsuccessful, an ICD shock was successful in converting the arrhythmia.      67 year old man on chronic anticoagulation with known cardiomyopathy and paced maker defibrillator in place presents today after syncopal episode. Per documentation from cardiology this is likely secondary to his episode of V. tach.  He had ICD successful conversion Will obtain labs Will evaluate with imaging to assure no intracranial bleeding Will consult cardiology  Evaluated with CT head no evidence of acute intracranial abnormality or bleeding noted Labs obtained INR is therapeutic CBC is normal Troponin is normal Creatinine is elevated but stable from prior Waiting definitive cardiology disposition        Final Clinical Impression(s) / ED Diagnoses Final diagnoses:  V-tach [facility])  Syncope, unspecified syncope type  Chronic anticoagulation    Rx / DC Orders ED Discharge Orders          Ordered    amiodarone (PACERONE) 200 MG tablet  Daily        02/01/23 1610    amiodarone (PACERONE) 200 MG tablet  BID        02/01/23 1610              Margarita Grizzle, MD 02/01/23 1703    Margarita Grizzle, MD 02/01/23 1703    Margarita Grizzle, MD 02/01/23 1715

## 2023-02-06 NOTE — Telephone Encounter (Signed)
Appt made for Tuesday Sept 10th at 0820- Patient states that he is able to come to this appt. Will leave appt's "as-is" for now and reevaluate if they are needed after this appt.

## 2023-02-08 ENCOUNTER — Ambulatory Visit: Payer: 59 | Attending: Cardiology

## 2023-02-08 DIAGNOSIS — Z7901 Long term (current) use of anticoagulants: Secondary | ICD-10-CM

## 2023-02-08 DIAGNOSIS — I513 Intracardiac thrombosis, not elsewhere classified: Secondary | ICD-10-CM | POA: Diagnosis not present

## 2023-02-08 LAB — POCT INR: INR: 3.4 — AB (ref 2.0–3.0)

## 2023-02-08 NOTE — Patient Instructions (Addendum)
    Description   Skip today's dosage of Warfarin, then START taking warfarin 1 tablet daily except for 1/2 tablet on Mondays, Wednesdays and Fridays.  Recheck INR in 1 week, started on Amiodarone 02/01/23 200mg  2 tablets twice daily x 14 days, then will decrease to 200mg  twice daily.   [facility] [phone]

## 2023-02-12 ENCOUNTER — Other Ambulatory Visit: Payer: Self-pay

## 2023-02-12 ENCOUNTER — Other Ambulatory Visit (HOSPITAL_COMMUNITY): Payer: Self-pay

## 2023-02-14 ENCOUNTER — Ambulatory Visit: Payer: 59

## 2023-02-16 ENCOUNTER — Ambulatory Visit: Payer: 59

## 2023-02-16 DIAGNOSIS — Z7901 Long term (current) use of anticoagulants: Secondary | ICD-10-CM | POA: Diagnosis not present

## 2023-02-16 DIAGNOSIS — I513 Intracardiac thrombosis, not elsewhere classified: Secondary | ICD-10-CM

## 2023-02-16 LAB — POCT INR: INR: 5.5 — AB (ref 2.0–3.0)

## 2023-02-16 NOTE — Patient Instructions (Addendum)
Description   HOLD today's dose and tomorrow's dose and eat greens. Then START taking warfarin 1/2 tablet daily except for 1 tablet on Tuesdays and Thursdays.   Recheck INR in 6 days  Started on Amiodarone 02/01/23 200mg  2 tablets twice daily x 14 days. 8/23 decreased to 200mg  twice daily.   [facility] [phone]

## 2023-02-20 ENCOUNTER — Ambulatory Visit (INDEPENDENT_AMBULATORY_CARE_PROVIDER_SITE_OTHER): Payer: 59

## 2023-02-20 DIAGNOSIS — I255 Ischemic cardiomyopathy: Secondary | ICD-10-CM | POA: Diagnosis not present

## 2023-02-20 DIAGNOSIS — I5022 Chronic systolic (congestive) heart failure: Secondary | ICD-10-CM

## 2023-02-20 LAB — CUP PACEART REMOTE DEVICE CHECK
Battery Remaining Longevity: 75 mo
Battery Voltage: 3 V
Brady Statistic AP VP Percent: 0.03 %
Brady Statistic AP VS Percent: 92.65 %
Brady Statistic AS VP Percent: 0 %
Brady Statistic AS VS Percent: 7.31 %
Brady Statistic RA Percent Paced: 92.58 %
Brady Statistic RV Percent Paced: 0.04 %
Date Time Interrogation Session: 20240827022725
HighPow Impedance: 49 Ohm
HighPow Impedance: 74 Ohm
Implantable Lead Connection Status: 753985
Implantable Lead Connection Status: 753985
Implantable Lead Implant Date: 20120906
Implantable Lead Implant Date: 20120906
Implantable Lead Location: 753859
Implantable Lead Location: 753860
Implantable Lead Model: 185
Implantable Lead Model: 5076
Implantable Lead Serial Number: 358872
Implantable Pulse Generator Implant Date: 20200831
Lead Channel Impedance Value: 342 Ohm
Lead Channel Impedance Value: 380 Ohm
Lead Channel Impedance Value: 399 Ohm
Lead Channel Pacing Threshold Amplitude: 0.5 V
Lead Channel Pacing Threshold Amplitude: 0.625 V
Lead Channel Pacing Threshold Pulse Width: 0.4 ms
Lead Channel Pacing Threshold Pulse Width: 0.4 ms
Lead Channel Sensing Intrinsic Amplitude: 2.25 mV
Lead Channel Sensing Intrinsic Amplitude: 2.25 mV
Lead Channel Sensing Intrinsic Amplitude: 3.125 mV
Lead Channel Sensing Intrinsic Amplitude: 3.125 mV
Lead Channel Setting Pacing Amplitude: 1.5 V
Lead Channel Setting Pacing Amplitude: 2.5 V
Lead Channel Setting Pacing Pulse Width: 0.4 ms
Lead Channel Setting Sensing Sensitivity: 0.3 mV
Zone Setting Status: 755011
Zone Setting Status: 755011

## 2023-02-21 ENCOUNTER — Ambulatory Visit: Payer: 59 | Admitting: *Deleted

## 2023-02-21 DIAGNOSIS — Z7901 Long term (current) use of anticoagulants: Secondary | ICD-10-CM

## 2023-02-21 DIAGNOSIS — I513 Intracardiac thrombosis, not elsewhere classified: Secondary | ICD-10-CM

## 2023-02-21 LAB — POCT INR: INR: 2.7 (ref 2.0–3.0)

## 2023-02-21 NOTE — Patient Instructions (Addendum)
Description   Continue taking warfarin 1/2 tablet daily except for 1 tablet on Tuesdays and Thursdays.   Recheck INR in 1 week. KEEP YOUR SPINACH IN YOUR DIET WEEKLY. Started on Amiodarone 02/01/23 200mg  2 tablets twice daily x 14 days. 8/23 decreased to 200mg  twice daily.   [facility] [phone]

## 2023-02-22 ENCOUNTER — Other Ambulatory Visit: Payer: Self-pay | Admitting: Physician Assistant

## 2023-02-23 ENCOUNTER — Other Ambulatory Visit (HOSPITAL_COMMUNITY): Payer: Self-pay

## 2023-02-23 ENCOUNTER — Other Ambulatory Visit: Payer: Self-pay

## 2023-02-23 MED ORDER — AMIODARONE HCL 200 MG PO TABS
ORAL_TABLET | ORAL | 0 refills | Status: DC
  Filled 2023-02-23: qty 84, 28d supply, fill #0

## 2023-02-26 ENCOUNTER — Encounter (HOSPITAL_COMMUNITY): Payer: Self-pay

## 2023-02-26 ENCOUNTER — Inpatient Hospital Stay (HOSPITAL_COMMUNITY)
Admission: EM | Admit: 2023-02-26 | Discharge: 2023-03-20 | DRG: 001 | Disposition: A | Discharge status: Rehabilitation Facility | Payer: 59 | Attending: Cardiology | Admitting: Cardiology

## 2023-02-26 ENCOUNTER — Other Ambulatory Visit: Payer: Self-pay

## 2023-02-26 ENCOUNTER — Emergency Department (HOSPITAL_COMMUNITY): Payer: 59

## 2023-02-26 DIAGNOSIS — K761 Chronic passive congestion of liver: Secondary | ICD-10-CM | POA: Diagnosis present

## 2023-02-26 DIAGNOSIS — I5023 Acute on chronic systolic (congestive) heart failure: Secondary | ICD-10-CM | POA: Diagnosis present

## 2023-02-26 DIAGNOSIS — I959 Hypotension, unspecified: Secondary | ICD-10-CM | POA: Diagnosis not present

## 2023-02-26 DIAGNOSIS — I480 Paroxysmal atrial fibrillation: Secondary | ICD-10-CM | POA: Diagnosis present

## 2023-02-26 DIAGNOSIS — E861 Hypovolemia: Secondary | ICD-10-CM | POA: Diagnosis present

## 2023-02-26 DIAGNOSIS — C61 Malignant neoplasm of prostate: Secondary | ICD-10-CM | POA: Diagnosis not present

## 2023-02-26 DIAGNOSIS — I130 Hypertensive heart and chronic kidney disease with heart failure and stage 1 through stage 4 chronic kidney disease, or unspecified chronic kidney disease: Principal | ICD-10-CM | POA: Diagnosis present

## 2023-02-26 DIAGNOSIS — I509 Heart failure, unspecified: Secondary | ICD-10-CM

## 2023-02-26 DIAGNOSIS — I5084 End stage heart failure: Secondary | ICD-10-CM | POA: Diagnosis present

## 2023-02-26 DIAGNOSIS — I517 Cardiomegaly: Secondary | ICD-10-CM | POA: Diagnosis not present

## 2023-02-26 DIAGNOSIS — T8383XA Hemorrhage of genitourinary prosthetic devices, implants and grafts, initial encounter: Secondary | ICD-10-CM | POA: Diagnosis not present

## 2023-02-26 DIAGNOSIS — A499 Bacterial infection, unspecified: Secondary | ICD-10-CM | POA: Diagnosis not present

## 2023-02-26 DIAGNOSIS — R310 Gross hematuria: Secondary | ICD-10-CM | POA: Diagnosis not present

## 2023-02-26 DIAGNOSIS — I472 Ventricular tachycardia, unspecified: Secondary | ICD-10-CM | POA: Diagnosis present

## 2023-02-26 DIAGNOSIS — Z1152 Encounter for screening for COVID-19: Secondary | ICD-10-CM

## 2023-02-26 DIAGNOSIS — Z515 Encounter for palliative care: Secondary | ICD-10-CM | POA: Diagnosis not present

## 2023-02-26 DIAGNOSIS — N390 Urinary tract infection, site not specified: Secondary | ICD-10-CM | POA: Diagnosis present

## 2023-02-26 DIAGNOSIS — N183 Chronic kidney disease, stage 3 unspecified: Secondary | ICD-10-CM | POA: Diagnosis not present

## 2023-02-26 DIAGNOSIS — Z794 Long term (current) use of insulin: Secondary | ICD-10-CM

## 2023-02-26 DIAGNOSIS — F1010 Alcohol abuse, uncomplicated: Secondary | ICD-10-CM | POA: Diagnosis present

## 2023-02-26 DIAGNOSIS — D72829 Elevated white blood cell count, unspecified: Secondary | ICD-10-CM | POA: Diagnosis not present

## 2023-02-26 DIAGNOSIS — F4321 Adjustment disorder with depressed mood: Secondary | ICD-10-CM | POA: Diagnosis not present

## 2023-02-26 DIAGNOSIS — E871 Hypo-osmolality and hyponatremia: Secondary | ICD-10-CM | POA: Diagnosis present

## 2023-02-26 DIAGNOSIS — I4892 Unspecified atrial flutter: Secondary | ICD-10-CM | POA: Diagnosis not present

## 2023-02-26 DIAGNOSIS — R7401 Elevation of levels of liver transaminase levels: Secondary | ICD-10-CM | POA: Diagnosis not present

## 2023-02-26 DIAGNOSIS — I482 Chronic atrial fibrillation, unspecified: Secondary | ICD-10-CM | POA: Diagnosis not present

## 2023-02-26 DIAGNOSIS — Z9861 Coronary angioplasty status: Secondary | ICD-10-CM

## 2023-02-26 DIAGNOSIS — J9601 Acute respiratory failure with hypoxia: Principal | ICD-10-CM

## 2023-02-26 DIAGNOSIS — I4891 Unspecified atrial fibrillation: Secondary | ICD-10-CM | POA: Diagnosis present

## 2023-02-26 DIAGNOSIS — Z8546 Personal history of malignant neoplasm of prostate: Secondary | ICD-10-CM

## 2023-02-26 DIAGNOSIS — E785 Hyperlipidemia, unspecified: Secondary | ICD-10-CM | POA: Diagnosis present

## 2023-02-26 DIAGNOSIS — Z7984 Long term (current) use of oral hypoglycemic drugs: Secondary | ICD-10-CM

## 2023-02-26 DIAGNOSIS — R5381 Other malaise: Secondary | ICD-10-CM | POA: Diagnosis not present

## 2023-02-26 DIAGNOSIS — Z7189 Other specified counseling: Secondary | ICD-10-CM

## 2023-02-26 DIAGNOSIS — K922 Gastrointestinal hemorrhage, unspecified: Secondary | ICD-10-CM | POA: Diagnosis not present

## 2023-02-26 DIAGNOSIS — Z95810 Presence of automatic (implantable) cardiac defibrillator: Secondary | ICD-10-CM

## 2023-02-26 DIAGNOSIS — E1122 Type 2 diabetes mellitus with diabetic chronic kidney disease: Secondary | ICD-10-CM | POA: Diagnosis present

## 2023-02-26 DIAGNOSIS — I700 Atherosclerosis of aorta: Secondary | ICD-10-CM | POA: Diagnosis present

## 2023-02-26 DIAGNOSIS — Z7901 Long term (current) use of anticoagulants: Secondary | ICD-10-CM

## 2023-02-26 DIAGNOSIS — Z95811 Presence of heart assist device: Secondary | ICD-10-CM | POA: Diagnosis not present

## 2023-02-26 DIAGNOSIS — I255 Ischemic cardiomyopathy: Secondary | ICD-10-CM | POA: Diagnosis present

## 2023-02-26 DIAGNOSIS — I10 Essential (primary) hypertension: Secondary | ICD-10-CM | POA: Diagnosis present

## 2023-02-26 DIAGNOSIS — Y846 Urinary catheterization as the cause of abnormal reaction of the patient, or of later complication, without mention of misadventure at the time of the procedure: Secondary | ICD-10-CM | POA: Diagnosis present

## 2023-02-26 DIAGNOSIS — N1831 Chronic kidney disease, stage 3a: Secondary | ICD-10-CM | POA: Diagnosis not present

## 2023-02-26 DIAGNOSIS — J9602 Acute respiratory failure with hypercapnia: Secondary | ICD-10-CM | POA: Diagnosis present

## 2023-02-26 DIAGNOSIS — J449 Chronic obstructive pulmonary disease, unspecified: Secondary | ICD-10-CM | POA: Diagnosis present

## 2023-02-26 DIAGNOSIS — I272 Pulmonary hypertension, unspecified: Secondary | ICD-10-CM | POA: Diagnosis present

## 2023-02-26 DIAGNOSIS — I5043 Acute on chronic combined systolic (congestive) and diastolic (congestive) heart failure: Secondary | ICD-10-CM | POA: Diagnosis present

## 2023-02-26 DIAGNOSIS — R7303 Prediabetes: Secondary | ICD-10-CM | POA: Diagnosis not present

## 2023-02-26 DIAGNOSIS — I513 Intracardiac thrombosis, not elsewhere classified: Secondary | ICD-10-CM | POA: Diagnosis present

## 2023-02-26 DIAGNOSIS — D689 Coagulation defect, unspecified: Secondary | ICD-10-CM | POA: Diagnosis present

## 2023-02-26 DIAGNOSIS — Z7982 Long term (current) use of aspirin: Secondary | ICD-10-CM

## 2023-02-26 DIAGNOSIS — N1832 Chronic kidney disease, stage 3b: Secondary | ICD-10-CM | POA: Diagnosis present

## 2023-02-26 DIAGNOSIS — Z87891 Personal history of nicotine dependence: Secondary | ICD-10-CM

## 2023-02-26 DIAGNOSIS — Z48812 Encounter for surgical aftercare following surgery on the circulatory system: Secondary | ICD-10-CM | POA: Diagnosis not present

## 2023-02-26 DIAGNOSIS — N179 Acute kidney failure, unspecified: Secondary | ICD-10-CM | POA: Diagnosis not present

## 2023-02-26 DIAGNOSIS — Z923 Personal history of irradiation: Secondary | ICD-10-CM

## 2023-02-26 DIAGNOSIS — I2510 Atherosclerotic heart disease of native coronary artery without angina pectoris: Secondary | ICD-10-CM | POA: Diagnosis not present

## 2023-02-26 DIAGNOSIS — I361 Nonrheumatic tricuspid (valve) insufficiency: Secondary | ICD-10-CM | POA: Diagnosis not present

## 2023-02-26 DIAGNOSIS — Z79899 Other long term (current) drug therapy: Secondary | ICD-10-CM

## 2023-02-26 DIAGNOSIS — Z01810 Encounter for preprocedural cardiovascular examination: Secondary | ICD-10-CM | POA: Diagnosis not present

## 2023-02-26 DIAGNOSIS — D62 Acute posthemorrhagic anemia: Secondary | ICD-10-CM | POA: Diagnosis not present

## 2023-02-26 DIAGNOSIS — I5042 Chronic combined systolic (congestive) and diastolic (congestive) heart failure: Secondary | ICD-10-CM | POA: Diagnosis not present

## 2023-02-26 DIAGNOSIS — I252 Old myocardial infarction: Secondary | ICD-10-CM

## 2023-02-26 LAB — COMPREHENSIVE METABOLIC PANEL
ALT: 46 U/L — ABNORMAL HIGH (ref 0–44)
AST: 70 U/L — ABNORMAL HIGH (ref 15–41)
Albumin: 3 g/dL — ABNORMAL LOW (ref 3.5–5.0)
Alkaline Phosphatase: 73 U/L (ref 38–126)
Anion gap: 9 (ref 5–15)
BUN: 38 mg/dL — ABNORMAL HIGH (ref 8–23)
CO2: 18 mmol/L — ABNORMAL LOW (ref 22–32)
Calcium: 8 mg/dL — ABNORMAL LOW (ref 8.9–10.3)
Chloride: 108 mmol/L (ref 98–111)
Creatinine, Ser: 1.89 mg/dL — ABNORMAL HIGH (ref 0.61–1.24)
GFR, Estimated: 38 mL/min — ABNORMAL LOW (ref >60)
Glucose, Bld: 166 mg/dL — ABNORMAL HIGH (ref 70–99)
Potassium: 4 mmol/L (ref 3.5–5.1)
Sodium: 135 mmol/L (ref 135–145)
Total Bilirubin: 0.8 mg/dL (ref 0.3–1.2)
Total Protein: 6 g/dL — ABNORMAL LOW (ref 6.5–8.1)

## 2023-02-26 LAB — I-STAT VENOUS BLOOD GAS, ED
Acid-base deficit: 7 mmol/L — ABNORMAL HIGH (ref 0.0–2.0)
Bicarbonate: 19 mmol/L — ABNORMAL LOW (ref 20.0–28.0)
Calcium, Ion: 0.97 mmol/L — ABNORMAL LOW (ref 1.15–1.40)
HCT: 51 % (ref 39.0–52.0)
Hemoglobin: 17.3 g/dL — ABNORMAL HIGH (ref 13.0–17.0)
O2 Saturation: 98 %
Potassium: 8.3 mmol/L (ref 3.5–5.1)
Sodium: 135 mmol/L (ref 135–145)
TCO2: 20 mmol/L — ABNORMAL LOW (ref 22–32)
pCO2, Ven: 40.5 mmHg — ABNORMAL LOW (ref 44–60)
pH, Ven: 7.28 (ref 7.25–7.43)
pO2, Ven: 112 mmHg — ABNORMAL HIGH (ref 32–45)

## 2023-02-26 LAB — LACTIC ACID, PLASMA
Lactic Acid, Venous: 1.7 mmol/L (ref 0.5–1.9)
Lactic Acid, Venous: 1.9 mmol/L (ref 0.5–1.9)

## 2023-02-26 LAB — TROPONIN I (HIGH SENSITIVITY)
Troponin I (High Sensitivity): 7 ng/L (ref <18)
Troponin I (High Sensitivity): 13 ng/L (ref <18)

## 2023-02-26 LAB — CBC WITH DIFFERENTIAL/PLATELET
Abs Immature Granulocytes: 0.02 10*3/uL (ref 0.00–0.07)
Basophils Absolute: 0.1 10*3/uL (ref 0.0–0.1)
Basophils Relative: 1 %
Eosinophils Absolute: 1.5 10*3/uL — ABNORMAL HIGH (ref 0.0–0.5)
Eosinophils Relative: 16 %
HCT: 50.5 % (ref 39.0–52.0)
Hemoglobin: 16.3 g/dL (ref 13.0–17.0)
Immature Granulocytes: 0 %
Lymphocytes Relative: 46 %
Lymphs Abs: 4.6 10*3/uL — ABNORMAL HIGH (ref 0.7–4.0)
MCH: 31.5 pg (ref 26.0–34.0)
MCHC: 32.3 g/dL (ref 30.0–36.0)
MCV: 97.7 fL (ref 80.0–100.0)
Monocytes Absolute: 0.9 10*3/uL (ref 0.1–1.0)
Monocytes Relative: 9 %
Neutro Abs: 2.8 10*3/uL (ref 1.7–7.7)
Neutrophils Relative %: 28 %
Platelets: 225 10*3/uL (ref 150–400)
RBC: 5.17 MIL/uL (ref 4.22–5.81)
RDW: 14.6 % (ref 11.5–15.5)
WBC: 9.9 10*3/uL (ref 4.0–10.5)
nRBC: 0 % (ref 0.0–0.2)

## 2023-02-26 LAB — PROTIME-INR
INR: 2.8 — ABNORMAL HIGH (ref 0.8–1.2)
Prothrombin Time: 29.5 seconds — ABNORMAL HIGH (ref 11.4–15.2)

## 2023-02-26 LAB — BRAIN NATRIURETIC PEPTIDE: B Natriuretic Peptide: 1060.3 pg/mL — ABNORMAL HIGH (ref 0.0–100.0)

## 2023-02-26 LAB — SARS CORONAVIRUS 2 BY RT PCR: SARS Coronavirus 2 by RT PCR: NEGATIVE

## 2023-02-26 MED ORDER — TAMSULOSIN HCL 0.4 MG PO CAPS
0.4000 mg | ORAL_CAPSULE | Freq: Every day | ORAL | Status: DC
  Administered 2023-02-26 – 2023-03-19 (×20): 0.4 mg via ORAL
  Filled 2023-02-26 (×20): qty 1

## 2023-02-26 MED ORDER — ACETAMINOPHEN 650 MG RE SUPP: 650.0000 mg | Freq: Four times a day (QID) | RECTAL | Status: DC | PRN

## 2023-02-26 MED ORDER — ONDANSETRON HCL 4 MG/2ML IJ SOLN: 4.0000 mg | Freq: Four times a day (QID) | INTRAMUSCULAR | Status: DC | PRN

## 2023-02-26 MED ORDER — ATORVASTATIN CALCIUM 80 MG PO TABS
80.0000 mg | ORAL_TABLET | Freq: Every day | ORAL | Status: DC
  Administered 2023-02-26 – 2023-03-20 (×21): 80 mg via ORAL
  Filled 2023-02-26 (×21): qty 1

## 2023-02-26 MED ORDER — FUROSEMIDE 10 MG/ML IJ SOLN
60.0000 mg | Freq: Two times a day (BID) | INTRAMUSCULAR | Status: DC
  Administered 2023-02-26 – 2023-02-27 (×2): 60 mg via INTRAVENOUS
  Filled 2023-02-26 (×2): qty 6

## 2023-02-26 MED ORDER — WARFARIN - PHARMACIST DOSING INPATIENT: Freq: Every day | Status: DC

## 2023-02-26 MED ORDER — ACETAMINOPHEN 325 MG PO TABS
650.0000 mg | ORAL_TABLET | Freq: Four times a day (QID) | ORAL | Status: DC | PRN
  Administered 2023-03-06 – 2023-03-07 (×4): 650 mg via ORAL
  Filled 2023-02-26 (×4): qty 2

## 2023-02-26 MED ORDER — DAPAGLIFLOZIN PROPANEDIOL 10 MG PO TABS
10.0000 mg | ORAL_TABLET | Freq: Every day | ORAL | Status: DC
  Administered 2023-02-26 – 2023-02-27 (×2): 10 mg via ORAL
  Filled 2023-02-26 (×2): qty 1

## 2023-02-26 MED ORDER — FUROSEMIDE 10 MG/ML IJ SOLN
40.0000 mg | Freq: Once | INTRAMUSCULAR | Status: AC
  Administered 2023-02-26: 40 mg via INTRAVENOUS
  Filled 2023-02-26: qty 4

## 2023-02-26 MED ORDER — ONDANSETRON HCL 4 MG PO TABS: 4.0000 mg | ORAL_TABLET | Freq: Four times a day (QID) | ORAL | Status: DC | PRN

## 2023-02-26 MED ORDER — ORAL CARE MOUTH RINSE: 15.0000 mL | OROMUCOSAL | Status: DC | PRN

## 2023-02-26 MED ORDER — AMIODARONE HCL 200 MG PO TABS
200.0000 mg | ORAL_TABLET | Freq: Two times a day (BID) | ORAL | Status: DC
  Administered 2023-02-26 – 2023-02-27 (×2): 200 mg via ORAL
  Filled 2023-02-26 (×2): qty 1

## 2023-02-26 MED ORDER — WARFARIN SODIUM 2.5 MG PO TABS
2.5000 mg | ORAL_TABLET | Freq: Once | ORAL | Status: AC
  Administered 2023-02-26: 2.5 mg via ORAL
  Filled 2023-02-26 (×2): qty 1

## 2023-02-26 MED ORDER — SPIRONOLACTONE 12.5 MG HALF TABLET
12.5000 mg | ORAL_TABLET | ORAL | Status: DC
  Administered 2023-02-26: 12.5 mg via ORAL
  Filled 2023-02-26 (×2): qty 1

## 2023-02-26 NOTE — H&P (Addendum)
History and Physical    Patient: Steven [name] OZH:[id number] DOB: 11-May-1956 DOA: 02/26/2023 DOS: the patient was seen and examined on 02/26/2023 PCP: [name], MD  Patient coming from: Home  Chief Complaint:  Chief Complaint  Patient presents with   Respiratory Distress   HPI: Steven [name] is a 67 y.o. male with medical history significant of ischemic cardiomyopathy, systolic heart failure, LV thrombus, history of ventricular tachycardia, hypertension, CKD stage 3, hyperlipidemia and hypertension who presented with dyspnea.  Last hospitalization 04/21 to 10/18/22 for heart failure exacerbation. Patient was diuresed and placed on guideline directed medical therapy. Furosemide was changed to torsemide and instructed to take 40 mg po daily.   He did follow up as outpatient, and his torsemide was decreased to 20 mg every other day due to decreased GFR.  01/01/23 cardiology outpatient follow up he was found with 7 lbs weight increase from his last visit on 11/2022. He was instructed to take one extra dose of torsemide and then to continue with every other day loop diuretic dosing.   02/01/23 he had ED visit due to syncope. It was found to be related to ventricular tachycardia, that was successfully treated with ICD shock.   Her reports being compliant with his medications, along with a fluid and salt restricted diet.  The day prior his symptoms started he endorsed eating out, but clarifies meal was low in salt.   This time he reports a sudden onset of rapidly progressive dyspnea for the last 24 hrs. Dyspnea is worse with exertion, no chest pain, no cough or wheezing. No improving factors.  No associated lower extremity edema, but positive PND and orthopnea.  He recognized symptoms of volume overload and decided to call EMS before feeling worse.  When EMS arrived he was found hypoxic with 02 saturation 78% on room air. He was placed on Cpap and transported to the ED.      Review of  Systems: As mentioned in the history of present illness. All other systems reviewed and are negative. Past Medical History:  Diagnosis Date   Acute MI, anterior wall (HCC)    AICD (automatic cardioverter/defibrillator) present    CAD (coronary artery disease)    2D ECHO, 07/13/2011 - EF <25%, LV moderatelty dilated, LA moderately dilatedLEXISCAN, 12/14/2011 - moderate-severe perfusion defect seen in the basal anteroseptal, mid anterior, apicacl anterior, apical, apical inferior, and apical lateral regions, post-stress EF 25%, new EKG changes from baseline abnormalities   Cancer [facility])    Prostate   CHF (congestive heart failure) (HCC) 2012   Hypertension 08/08/2021   Inguinal hernia, left    Pneumonia    November 2023   Pre-diabetes    Past Surgical History:  Procedure Laterality Date   CARDIAC CATHETERIZATION  11/25/2010   Predilation balloon-Apex monorail 2x9mm, Cutting balloon-2.25x70mm, resulting in a reduction of 100% stenosis down to less than 10%   CARDIAC CATHETERIZATION  07/12/2010   LAD stented with a 3.5x54mm bare-metal Veriflex stent resulting in a reduction of 100% lesion to 0%   CARDIAC DEFIBRILLATOR PLACEMENT  03/02/2011   Medtronic Protecta XT DR, model #G295MWU, serial #[id number]   COLONOSCOPY     GOLD SEED IMPLANT N/A 07/21/2021   Procedure: GOLD SEED IMPLANT;  Surgeon: Heloise Purpura, MD;  Location: [facility] ORS;  Service: Urology;  Laterality: N/A;  NEEDS 30 MIN   HERNIA REPAIR  2018   ICD GENERATOR CHANGEOUT N/A 02/24/2019   Procedure: ICD GENERATOR CHANGEOUT;  Surgeon: Thurmon Fair, MD;  Location: [facility] INVASIVE CV LAB;  Service: Cardiovascular;  Laterality: N/A;   PACEMAKER PLACEMENT  07/14/2010   Temporary placement of pacemaker, if rhythm issue continues will need a permanent device   SPACE OAR INSTILLATION N/A 07/21/2021   Procedure: SPACE OAR INSTILLATION;  Surgeon: Heloise Purpura, MD;  Location: [facility] ORS;  Service: Urology;  Laterality: N/A;   Social History:   reports that he quit smoking about 12 years ago. His smoking use included cigarettes and cigars. He has never used smokeless tobacco. He reports current alcohol use of about 2.0 - 3.0 standard drinks of alcohol per week. He reports that he does not use drugs.  No Known Allergies  History reviewed. No pertinent family history.  Prior to Admission medications   Medication Sig Start Date End Date Taking? Authorizing Provider  acetaminophen (TYLENOL) 500 MG tablet Take 1,000 mg by mouth every 6 (six) hours as needed for mild pain.   Yes [provider]  amiodarone (PACERONE) 200 MG tablet Take 2 tablets (400 mg total) by mouth 2 (two) times daily for 14 days, THEN 1 tablet (200 mg total) 2 (two) times daily for 14 days. 02/23/23 03/23/23 Yes Sheilah Pigeon, PA-C  atorvastatin (LIPITOR) 80 MG tablet Take 1 tablet (80 mg total) by mouth daily. 01/01/23  Yes Robbie Lis M, PA-C  Cholecalciferol (VITAMIN D) 125 MCG (5000 UT) CAPS Take 5,000 Units by mouth daily.   Yes [provider]  dapagliflozin propanediol (FARXIGA) 10 MG TABS tablet Take 1 tablet (10 mg total) by mouth daily. 11/21/22  Yes Robbie Lis M, PA-C  metoprolol succinate (TOPROL-XL) 25 MG 24 hr tablet Take 1 tablet (25 mg total) by mouth daily. 01/11/23  Yes Lennette Bihari, MD  Multiple Vitamin (MULTIVITAMIN) tablet Take 1 tablet by mouth daily.   Yes [provider]  nitroGLYCERIN (NITROSTAT) 0.4 MG SL tablet Place 1 tablet (0.4 mg total) under the tongue every 5 (five) minutes as needed. Usual dose is q 5 minutes x 3 doses. 07/27/21  Yes Lennette Bihari, MD  potassium chloride SA (KLOR-CON M) 20 MEQ tablet Take 1 tablet (20 mEq total) by mouth daily. 01/11/23  Yes Lennette Bihari, MD  sacubitril-valsartan (ENTRESTO) 24-26 MG Take 1 tablet by mouth 2 (two) times daily. 11/21/22  Yes Robbie Lis M, PA-C  spironolactone (ALDACTONE) 25 MG tablet Take 0.5 tablets (12.5 mg total) by mouth every other  day. TAKE EOD ON EVEN DAYS 04/14/22  Yes Lennette Bihari, MD  tamsulosin (FLOMAX) 0.4 MG CAPS capsule Take 0.4 mg by mouth in the morning and at bedtime.   Yes [provider]  torsemide (DEMADEX) 20 MG tablet Take 1 tablet by mouth every other day. 12/22/22  Yes Clegg, Amy D, NP  warfarin (COUMADIN) 5 MG tablet Take 1 tablet daily except 1/2 tablet on Wednesdays and Fridays or as directed. Patient taking differently: Take 2.5-5 mg by mouth See admin instructions. Take 1 tablet by mouth every Tue and Thur, then take 1/2 tablet all other days 08/14/22  Yes Lennette Bihari, MD  ZENPEP [phone] units CPEP Take 1 capsule by mouth 3 (three) times daily with meals. 08/09/22  Yes [provider]  amiodarone (PACERONE) 200 MG tablet Take 1 tablet (200 mg) by mouth daily. 03/04/23   Sheilah Pigeon, PA-C    Physical Exam: Vitals:   02/26/23 0900 02/26/23 0915 02/26/23 1000 02/26/23 1001  BP: 97/69 99/71 108/63   Pulse: 61 60 (!) 59  Resp: (!) 26 (!) 25 (!) 22   Temp:    97.7 F (36.5 C)  TempSrc:    Oral  SpO2: 94% 94% 93%    Neurology awake and alert ENT with mild pallor with no icterus Cardiovascular with S1 and S2 present and regular with no gallops or rubs, positive systolic murmur at the apex Positive JVD Trace lower extremity edema, cold lower extremities.  Respiratory with bilateral rales with no wheezing or rhonchi. Increased work of breathing with mild accessory muscle use.  Abdomen with no distention   Data Reviewed:   VBG 7,28/ 40.5/ 112/ 19/98%  Na 135, K 4.0 Cl 108 bicarbonate 18, glucose 166 bun 38 cr 1,89 AST 70 ALT 46 BNP 1,060 High sensitive troponin 7 and 13  Wbc 9.9 hgb 16.3 plt 225  INR 2,8 Sars COVID 19 negative   Chest radiograph with cardiomegaly, bilateral hilar vascular congestion with cephalization of the vasculature, bilateral interstitial infiltrates at the lower lobes. Fluid in the right fissure.  Pacemaker defibrillator in place with  one right atrial lead and one right ventricular lead.   EKG 78 bpm, left axis deviation, qtc 497, left bundle branch block, sinus rhythm with right atrial enlargement and 1st degree AV block, J point elevation V1 to V3, no significant T wave changes.   Assessment and Plan: Acute on chronic systolic congestive heart failure (HCC) Echocardiogram from 2023 with reduced LV systolic function <20%, adherent mass in the inferoseptal apical portion consistent with laminated/chronic thrombus. Global LV hypokinesis, LV cavity with moderate dilatation, RV systolic function with moderate reduction, LA with mild to moderate dilatation, RVSP 41.8 mmHg. No significant valvular disease.   Acute hypoxemic/ hypercapnic respiratory failure, due to acute cardiogenic pulmonary edema.   Plan for aggressive diuresis with furosemide 60 mg IV q12 hrs Continue spironolactone and SGLT 2 inh to augment diuresis.  Hold on B blocker and Entresto for now due to risk of hypotension.  I am concerned about cold lower extremities, will check stat lactic acid.  Check new echocardiogram.  Close hemodynamic support and telemetry monitoring.   History of VT EKG with sinus rhythm.  Plan to keel K at 4 and Mg 2 Continue telemetry monitoring He is taking amiodarone 200 mg po bid.   CAD (coronary artery disease) No clinical signs of acute coronary syndrome.  Continue furosemide for de congestion.  Continue blood pressure control.   Continue statin therapy (dyslipidemia).   AF (atrial fibrillation) (HCC) Currently patient on sinus rhythm. Hold on B blockade due to risk of hypotension and risk of low output heart failure.  He is on warfarin and his INR is therapeutic at 2,8   Hypertension Continue close blood pressure monitoring, holding b blockade and entresto for now.   CKD stage 3a, GFR 45-59 ml/min (HCC) Renal function with serum cr at 1,8 Positive volume overload.  Plan to continue diuresis with furosemide IV  along with oral spironolactone and SGLT 2 inh to augment diuresis.  Follow up renal function and electrolytes in am.  Avoid hypotension and nephrotoxic medications.   Elevated liver enzymes due to heart failure, follow up metabolic panel in am.   Malignant neoplasm of prostate (HCC) Follow up as outpatient.   Alcohol abuse No signs of alcohol withdrawal. Continue neuro checks per unit protocol.      Advance Care Planning:   Code Status: Full Code   Consults: none   Family Communication: no family at the bedside.   Severity of Illness: The  appropriate patient status for this patient is INPATIENT. Inpatient status is judged to be reasonable and necessary in order to provide the required intensity of service to ensure the patient's safety. The patient's presenting symptoms, physical exam findings, and initial radiographic and laboratory data in the context of their chronic comorbidities is felt to place them at high risk for further clinical deterioration. Furthermore, it is not anticipated that the patient will be medically stable for discharge from the hospital within 2 midnights of admission.   * I certify that at the point of admission it is my clinical judgment that the patient will require inpatient hospital care spanning beyond 2 midnights from the point of admission due to high intensity of service, high risk for further deterioration and high frequency of surveillance required.*  Author: Coralie Keens, MD 02/26/2023 10:02 AM  For on call review [email or web address].

## 2023-02-26 NOTE — Assessment & Plan Note (Addendum)
Echocardiogram from 2023 with reduced LV systolic function <20%, adherent mass in the inferoseptal apical portion consistent with laminated/chronic thrombus. Global LV hypokinesis, LV cavity with moderate dilatation, RV systolic function with moderate reduction, LA with mild to moderate dilatation, RVSP 41.8 mmHg. No significant valvular disease.   Acute hypoxemic/ hypercapnic respiratory failure, due to acute cardiogenic pulmonary edema.   Plan for aggressive diuresis with furosemide 60 mg IV q12 hrs Continue spironolactone and SGLT 2 inh to augment diuresis.  Hold on B blocker and Entresto for now due to risk of hypotension.  I am concerned about cold lower extremities, will check stat lactic acid.  Check new echocardiogram.  Close hemodynamic support and telemetry monitoring.

## 2023-02-26 NOTE — ED Notes (Addendum)
Bipap removed at this time and pt placed on 4L O2 via nasal cannula.

## 2023-02-26 NOTE — Progress Notes (Signed)
ANTICOAGULATION CONSULT NOTE - Initial Consult  Pharmacy Consult for Warfarin Indication:  LV Thrombus  No Known Allergies  Patient Measurements:    Vital Signs: Temp: 97.7 F (36.5 C) (09/02 0545) Temp Source: Oral (09/02 0545) BP: 99/71 (09/02 0915) Pulse Rate: 60 (09/02 0915)  Labs: Recent Labs    02/26/23 0231 02/26/23 0253 02/26/23 0410  HGB 16.3 17.3*  --   HCT 50.5 51.0  --   PLT 225  --   --   LABPROT 29.5*  --   --   INR 2.8*  --   --   CREATININE  --   --  1.89*  TROPONINIHS 7  --  13    CrCl cannot be calculated (Unknown ideal weight.).   Medical History: Past Medical History:  Diagnosis Date   Acute MI, anterior wall (HCC)    AICD (automatic cardioverter/defibrillator) present    CAD (coronary artery disease)    2D ECHO, 07/13/2011 - EF <25%, LV moderatelty dilated, LA moderately dilatedLEXISCAN, 12/14/2011 - moderate-severe perfusion defect seen in the basal anteroseptal, mid anterior, apicacl anterior, apical, apical inferior, and apical lateral regions, post-stress EF 25%, new EKG changes from baseline abnormalities   Cancer (HCC)    Prostate   CHF (congestive heart failure) (HCC) 2012   Hypertension 08/08/2021   Inguinal hernia, left    Pneumonia    November 2023   Pre-diabetes     Medications:  (Not in a hospital admission)  Scheduled:  Infusions:  PRN:   Assessment: 79 yom with a history of HFrEF, CAD, VT, LV thrombus, HTN, HLD, and CKD III. Patient is presenting with respiratory distress. Warfarin per pharmacy consult placed for  LV Thrombus .  Patient taking warfarin prior to arrival. Home dose is 5 mg (5 mg x 1) every Tue, Thu; 2.5 mg (5 mg x 0.5) all other days. Last taken 9/1 at 1230.  PT / INR today is 29.5 / 2.8, which is therapeutic INR 8/28: 2.7  Hgb 17.3; plt 225  Goal of Therapy:  INR Goal 2-3 Monitor platelets by anticoagulation protocol: Yes   Plan:  Give 2.5mg  warfarin this evening Further dosing per INR for  now Monitor for s/s of hemorrhage, daily INR, CBC Watch for new DDIs  Delmar Landau, PharmD, BCPS 02/26/2023 9:48 AM ED Clinical Pharmacist -  [phone]

## 2023-02-26 NOTE — ED Notes (Signed)
Pt is feeling better at this time. Reports feeling less short of breath. O2 at 3LPM currently with 93-95% on the monitor. Cardiac monitoring in place. Will continue to monitor.

## 2023-02-26 NOTE — Progress Notes (Signed)
Notified Dr. Ella Jubilee patient with order for 60 mg lasix to be given IV with current BP 96/63(74). Verified with MD to give or  hold the lasix and MD responded it was okay to give. Will continue to monitor patient.

## 2023-02-26 NOTE — Plan of Care (Signed)

## 2023-02-26 NOTE — Assessment & Plan Note (Signed)
Continue close blood pressure monitoring, holding b blockade and entresto for now.

## 2023-02-26 NOTE — ED Notes (Signed)
ED TO INPATIENT HANDOFF REPORT  ED Nurse Name and Phone #:  Corliss Blacker, RN [phone]  S Name/Age/Gender Steven [name] 67 y.o. male Room/Bed: 033C/033C  Code Status   Code Status: Full Code  Home/SNF/Other Home Patient oriented to: self, place, time, and situation Is this baseline? Yes   Triage Complete: Triage complete  Chief Complaint Heart failure (HCC) [I50.9]  Triage Note Pt BIB [facility] from home with c/o shortness of breath x2 hours. Endorses nausea. 78% on room air on EMS arrival with tachypnea and JVD. EMS reports rales in all fields. 1 nitroglycerin given en route. PT arrives to ED on CPAP.  EMS Vitals: BP 150/90 HR 84 RR 34 O2 85% on CPAP CBG not done  20 ga L hand   Allergies No Known Allergies  Level of Care/Admitting Diagnosis ED Disposition     ED Disposition  Admit   Condition  --   Comment  Hospital Area: [facility] [[id number]]  Level of Care: Telemetry Cardiac [[id number]]  May admit patient to [facility] or [facility] if equivalent level of care is available:: No  Covid Evaluation: Asymptomatic - no recent exposure (last 10 days) testing not required  Diagnosis: Heart failure [facility]) [[id number]]  Admitting Physician: Coralie Keens [[id number]]  Attending Physician: Coralie Keens [[id number]]  Certification:: I certify this patient will need inpatient services for at least 2 midnights  Expected Medical Readiness: 03/01/2023          B Medical/Surgery History Past Medical History:  Diagnosis Date   Acute MI, anterior wall (HCC)    AICD (automatic cardioverter/defibrillator) present    CAD (coronary artery disease)    2D ECHO, 07/13/2011 - EF <25%, LV moderatelty dilated, LA moderately dilatedLEXISCAN, 12/14/2011 - moderate-severe perfusion defect seen in the basal anteroseptal, mid anterior, apicacl anterior, apical, apical inferior, and apical lateral regions, post-stress EF 25%, new EKG changes from baseline abnormalities    Cancer [facility])    Prostate   CHF (congestive heart failure) (HCC) 2012   Hypertension 08/08/2021   Inguinal hernia, left    Pneumonia    November 2023   Pre-diabetes    Past Surgical History:  Procedure Laterality Date   CARDIAC CATHETERIZATION  11/25/2010   Predilation balloon-Apex monorail 2x10mm, Cutting balloon-2.25x62mm, resulting in a reduction of 100% stenosis down to less than 10%   CARDIAC CATHETERIZATION  07/12/2010   LAD stented with a 3.5x43mm bare-metal Veriflex stent resulting in a reduction of 100% lesion to 0%   CARDIAC DEFIBRILLATOR PLACEMENT  03/02/2011   Medtronic Protecta XT DR, model #[id number], serial #[id number]   COLONOSCOPY     GOLD SEED IMPLANT N/A 07/21/2021   Procedure: GOLD SEED IMPLANT;  Surgeon: Heloise Purpura, MD;  Location: [facility] ORS;  Service: Urology;  Laterality: N/A;  NEEDS 30 MIN   HERNIA REPAIR  2018   ICD GENERATOR CHANGEOUT N/A 02/24/2019   Procedure: ICD GENERATOR CHANGEOUT;  Surgeon: Thurmon Fair, MD;  Location: [facility] INVASIVE CV LAB;  Service: Cardiovascular;  Laterality: N/A;   PACEMAKER PLACEMENT  07/14/2010   Temporary placement of pacemaker, if rhythm issue continues will need a permanent device   SPACE OAR INSTILLATION N/A 07/21/2021   Procedure: SPACE OAR INSTILLATION;  Surgeon: Heloise Purpura, MD;  Location: [facility] ORS;  Service: Urology;  Laterality: N/A;     A IV Location/Drains/Wounds Patient Lines/Drains/Airways Status     Active Line/Drains/Airways     Name Placement date Placement time Site Days   Peripheral IV 02/26/23  20 G Left Hand 02/26/23  0219  Hand  less than 1            Intake/Output Last 24 hours  Intake/Output Summary (Last 24 hours) at 02/26/2023 1339 Last data filed at 02/26/2023 6644 Gross per 24 hour  Intake --  Output 1350 ml  Net -1350 ml    Labs/Imaging Results for orders placed or performed during the hospital encounter of 02/26/23 (from the past 48 hour(s))  SARS Coronavirus 2 by RT PCR (hospital  order, performed in [facility] hospital lab) *cepheid single result test* Anterior Nasal Swab     Status: None   Collection Time: 02/26/23  2:25 AM   Specimen: Anterior Nasal Swab  Result Value Ref Range   SARS Coronavirus 2 by RT PCR NEGATIVE NEGATIVE    Comment: Performed at [facility] Lab, [address] [place], [place] [address]  CBC with Differential/Platelet     Status: Abnormal   Collection Time: 02/26/23  2:31 AM  Result Value Ref Range   WBC 9.9 4.0 - 10.5 K/uL   RBC 5.17 4.22 - 5.81 MIL/uL   Hemoglobin 16.3 13.0 - 17.0 g/dL   HCT 25.9 56.3 - 87.5 %   MCV 97.7 80.0 - 100.0 fL   MCH 31.5 26.0 - 34.0 pg   MCHC 32.3 30.0 - 36.0 g/dL   RDW 64.3 32.9 - 51.8 %   Platelets 225 150 - 400 K/uL   nRBC 0.0 0.0 - 0.2 %   Neutrophils Relative % 28 %   Neutro Abs 2.8 1.7 - 7.7 K/uL   Lymphocytes Relative 46 %   Lymphs Abs 4.6 (H) 0.7 - 4.0 K/uL   Monocytes Relative 9 %   Monocytes Absolute 0.9 0.1 - 1.0 K/uL   Eosinophils Relative 16 %   Eosinophils Absolute 1.5 (H) 0.0 - 0.5 K/uL   Basophils Relative 1 %   Basophils Absolute 0.1 0.0 - 0.1 K/uL   Immature Granulocytes 0 %   Abs Immature Granulocytes 0.02 0.00 - 0.07 K/uL    Comment: Performed at [facility] Lab, [address], [place], [place] [address]  Troponin I (High Sensitivity)     Status: None   Collection Time: 02/26/23  2:31 AM  Result Value Ref Range   Troponin I (High Sensitivity) 7 <18 ng/L    Comment: (NOTE) Elevated high sensitivity troponin I (hsTnI) values and significant  changes across serial measurements may suggest ACS but many other  chronic and acute conditions are known to elevate hsTnI results.  Refer to the "Links" section for chest pain algorithms and additional  guidance. Performed at [facility] Lab, [address], [place], [place] [address]   Brain natriuretic peptide     Status: Abnormal   Collection Time: 02/26/23  2:31 AM  Result Value Ref Range   B Natriuretic Peptide 1,060.3  (H) 0.0 - 100.0 pg/mL    Comment: Performed at [facility] Lab, [address], [place], [place] [address]  Protime-INR     Status: Abnormal   Collection Time: 02/26/23  2:31 AM  Result Value Ref Range   Prothrombin Time 29.5 (H) 11.4 - 15.2 seconds   INR 2.8 (H) 0.8 - 1.2    Comment: (NOTE) INR goal varies based on device and disease states. Performed at [facility] Lab, [address] [place], [place] [address]   I-Stat venous blood gas, ED     Status: Abnormal   Collection Time:  02/26/23  2:53 AM  Result Value Ref Range   pH, Ven 7.280 7.25 - 7.43   pCO2, Ven 40.5 (L) 44 - 60 mmHg   pO2, Ven 112 (H) 32 - 45 mmHg   Bicarbonate 19.0 (L) 20.0 - 28.0 mmol/L   TCO2 20 (L) 22 - 32 mmol/L   O2 Saturation 98 %   Acid-base deficit 7.0 (H) 0.0 - 2.0 mmol/L   Sodium 135 135 - 145 mmol/L   Potassium 8.3 (HH) 3.5 - 5.1 mmol/L   Calcium, Ion 0.97 (L) 1.15 - 1.40 mmol/L   HCT 51.0 39.0 - 52.0 %   Hemoglobin 17.3 (H) 13.0 - 17.0 g/dL   Sample type VENOUS    Comment NOTIFIED PHYSICIAN   Comprehensive metabolic panel     Status: Abnormal   Collection Time: 02/26/23  4:10 AM  Result Value Ref Range   Sodium 135 135 - 145 mmol/L   Potassium 4.0 3.5 - 5.1 mmol/L   Chloride 108 98 - 111 mmol/L   CO2 18 (L) 22 - 32 mmol/L   Glucose, Bld 166 (H) 70 - 99 mg/dL    Comment: Glucose reference range applies only to samples taken after fasting for at least 8 hours.   BUN 38 (H) 8 - 23 mg/dL   Creatinine, Ser 4.09 (H) 0.61 - 1.24 mg/dL   Calcium 8.0 (L) 8.9 - 10.3 mg/dL   Total Protein 6.0 (L) 6.5 - 8.1 g/dL   Albumin 3.0 (L) 3.5 - 5.0 g/dL   AST 70 (H) 15 - 41 U/L   ALT 46 (H) 0 - 44 U/L   Alkaline Phosphatase 73 38 - 126 U/L   Total Bilirubin 0.8 0.3 - 1.2 mg/dL   GFR, Estimated 38 (L) >60 mL/min    Comment: (NOTE) Calculated using the CKD-EPI Creatinine Equation (2021)    Anion gap 9 5 - 15    Comment: Performed at [facility] Lab, [address], [place], [place] [address]   Troponin I (High Sensitivity)     Status: None   Collection Time: 02/26/23  4:10 AM  Result Value Ref Range   Troponin I (High Sensitivity) 13 <18 ng/L    Comment: (NOTE) Elevated high sensitivity troponin I (hsTnI) values and significant  changes across serial measurements may suggest ACS but many other  chronic and acute conditions are known to elevate hsTnI results.  Refer to the "Links" section for chest pain algorithms and additional  guidance. Performed at [facility] Lab, [address] [place], [place] [address]   Lactic acid, plasma     Status: None   Collection Time: 02/26/23 12:15 PM  Result Value Ref Range   Lactic Acid, Venous 1.7 0.5 - 1.9 mmol/L    Comment: Performed at [facility] Lab, [address], [place], [place] [address]   DG Chest Port 1 View  Result Date: 02/26/2023 CLINICAL DATA:  Shortness of breath, respiratory distress EXAM: PORTABLE CHEST 1 VIEW COMPARISON:  10/15/2022 FINDINGS: Left AICD remains in place, unchanged. Cardiomegaly. Mediastinal contours within normal limits. Diffuse bilateral airspace disease, worsening on the right since prior study with some improvement on the left. No effusions or acute bony abnormality. IMPRESSION: Severe diffuse bilateral airspace disease, worsening on the right and improving on the left. This could reflect edema or infection. Electronically Signed   By: Charlett Nose M.D.   On: 02/26/2023 02:46    Pending Labs Unresulted Labs (From admission, onward)  Start     Ordered   02/27/23 0500  Protime-INR  Daily,   R      02/26/23 0954   02/26/23 1203  Lactic acid, plasma  (Lactic Acid)  STAT Now then every 3 hours,   R (with STAT occurrences)      02/26/23 1202   Signed and Held  Basic metabolic panel  Tomorrow morning,   R        Signed and Held   Signed and Held  CBC  Tomorrow morning,   R        Signed and Held            Vitals/Pain Today's Vitals   02/26/23 0915 02/26/23 1000 02/26/23 1001 02/26/23  1200  BP: 99/71 108/63  92/63  Pulse: 60 (!) 59  (!) 59  Resp: (!) 25 (!) 22  (!) 22  Temp:   97.7 F (36.5 C)   TempSrc:   Oral   SpO2: 94% 93%  91%  PainSc:        Isolation Precautions No active isolations  Medications Medications  warfarin (COUMADIN) tablet 2.5 mg (has no administration in time range)  Warfarin - Pharmacist Dosing Inpatient (has no administration in time range)  furosemide (LASIX) injection 40 mg (40 mg Intravenous Given 02/26/23 0410)    Mobility walks with person assist     Focused Assessments Pulmonary Assessment Handoff:  Lung sounds: Bilateral Breath Sounds: Clear, Diminished L Breath Sounds: Rales R Breath Sounds: Rales O2 Device: (S) Nasal Cannula O2 Flow Rate (L/min): 3 L/min    R Recommendations: See Admitting Provider Note  Report given to:   Additional Notes:

## 2023-02-26 NOTE — ED Triage Notes (Signed)
Pt BIB [facility] from home with c/o shortness of breath x2 hours. Endorses nausea. 78% on room air on EMS arrival with tachypnea and JVD. EMS reports rales in all fields. 1 nitroglycerin given en route. PT arrives to ED on CPAP.  EMS Vitals: BP 150/90 HR 84 RR 34 O2 85% on CPAP CBG not done  20 ga L hand

## 2023-02-26 NOTE — Assessment & Plan Note (Signed)
Renal function with serum cr at 1,8 Positive volume overload.  Plan to continue diuresis with furosemide IV along with oral spironolactone and SGLT 2 inh to augment diuresis.  Follow up renal function and electrolytes in am.  Avoid hypotension and nephrotoxic medications.   Elevated liver enzymes due to heart failure, follow up metabolic panel in am.

## 2023-02-26 NOTE — Assessment & Plan Note (Signed)
Follow up as outpatient.  

## 2023-02-26 NOTE — Assessment & Plan Note (Addendum)
No clinical signs of acute coronary syndrome.  Continue furosemide for de congestion.  Continue blood pressure control.   Continue statin therapy (dyslipidemia).

## 2023-02-26 NOTE — Assessment & Plan Note (Signed)
Currently patient on sinus rhythm. Hold on B blockade due to risk of hypotension and risk of low output heart failure.  He is on warfarin and his INR is therapeutic at 2,8

## 2023-02-26 NOTE — Assessment & Plan Note (Signed)
No signs of alcohol withdrawal. Continue neuro checks per unit protocol.

## 2023-02-26 NOTE — Assessment & Plan Note (Signed)
EKG with sinus rhythm.  Plan to keel K at 4 and Mg 2 Continue telemetry monitoring He is taking amiodarone 200 mg po bid.

## 2023-02-26 NOTE — ED Notes (Addendum)
Increased to 5LPM [place] while sleeping. Pt O2 drops to 89% on 3LPM [place] while asleep. Currently on 5LPM [place] with 91-93% on the monitor. Cardiac monitoring in place. Pt denies shortness of breath. Reports he might have sleep apnea. Remains alert and oriented x 4.

## 2023-02-26 NOTE — ED Notes (Signed)
Pt placed back on Bipap at this time due to SPO2 decreasing to 87% on 4 L.

## 2023-02-26 NOTE — ED Provider Notes (Signed)
[facility] AT [facility] Provider Note   CSN: [id number] Arrival date & time: 02/26/23  0222     History  Chief Complaint  Patient presents with   Respiratory Distress    Steven [name] is a 67 y.o. male.  Patient presents to the emergency department for evaluation of difficulty breathing.  Patient arrives in the ED via EMS from home.  Patient developed shortness of breath tonight.  No associated chest pain.  EMS reports that the patient was in severe respiratory distress upon their arrival.  Patient with room air oxygen saturations in the 70s, tachypneic and tripoding.  Oxygen saturations did not improve much with supplemental oxygen, was placed on CPAP.  Patient still in the 80s on CPAP.  Patient mildly hypertensive.  EMS concerned for rales and CHF exacerbation, administered 1 sublingual nitroglycerin during transport.       Home Medications Prior to Admission medications   Medication Sig Start Date End Date Taking? Authorizing Provider  acetaminophen (TYLENOL) 500 MG tablet Take 1,000 mg by mouth every 6 (six) hours as needed for mild pain.    [provider]  amiodarone (PACERONE) 200 MG tablet Take 1 tablet (200 mg) by mouth daily. 03/04/23   Sheilah Pigeon, PA-C  amiodarone (PACERONE) 200 MG tablet Take 2 tablets (400 mg total) by mouth 2 (two) times daily for 14 days, THEN 1 tablet (200 mg total) 2 (two) times daily for 14 days. 02/23/23 03/23/23  Sheilah Pigeon, PA-C  atorvastatin (LIPITOR) 80 MG tablet Take 1 tablet (80 mg total) by mouth daily. 01/01/23   Allayne Butcher, PA-C  Cholecalciferol (VITAMIN D) 125 MCG (5000 UT) CAPS Take 5,000 Units by mouth daily.    [provider]  dapagliflozin propanediol (FARXIGA) 10 MG TABS tablet Take 1 tablet (10 mg total) by mouth daily. 11/21/22   Robbie Lis M, PA-C  metoprolol succinate (TOPROL-XL) 25 MG 24 hr tablet Take 1 tablet (25 mg total) by mouth daily. 01/11/23    Lennette Bihari, MD  Multiple Vitamin (MULTIVITAMIN) tablet Take 1 tablet by mouth daily.    [provider]  nitroGLYCERIN (NITROSTAT) 0.4 MG SL tablet Place 1 tablet (0.4 mg total) under the tongue every 5 (five) minutes as needed. Usual dose is q 5 minutes x 3 doses. 07/27/21   Lennette Bihari, MD  potassium chloride SA (KLOR-CON M) 20 MEQ tablet Take 1 tablet (20 mEq total) by mouth daily. 01/11/23   Lennette Bihari, MD  sacubitril-valsartan (ENTRESTO) 24-26 MG Take 1 tablet by mouth 2 (two) times daily. 11/21/22   Allayne Butcher, PA-C  spironolactone (ALDACTONE) 25 MG tablet Take 0.5 tablets (12.5 mg total) by mouth every other day. TAKE EOD ON EVEN DAYS 04/14/22   Lennette Bihari, MD  tamsulosin (FLOMAX) 0.4 MG CAPS capsule Take 0.4 mg by mouth at bedtime.    [provider]  torsemide (DEMADEX) 20 MG tablet Take 1 tablet by mouth every other day. 12/22/22   Tonye Becket D, NP  warfarin (COUMADIN) 5 MG tablet Take 1 tablet daily except 1/2 tablet on Wednesdays and Fridays or as directed. Patient taking differently: Take 2-5 mg by mouth See admin instructions. Take 1 tablet daily except 1/2 tablet on Monday. 08/14/22   Lennette Bihari, MD  ZENPEP [phone] units CPEP Take 1 capsule by mouth 3 (three) times daily with meals. 08/09/22   [provider]      Allergies  Patient has no known allergies.    Review of Systems   Review of Systems  Physical Exam Updated Vital Signs BP 93/66   Pulse 65   Temp (!) 96.8 F (36 C) (Temporal)   Resp (!) 26   SpO2 100%  Physical Exam Vitals and nursing note reviewed.  Constitutional:      General: He is not in acute distress.    Appearance: He is well-developed.  HENT:     Head: Normocephalic and atraumatic.     Mouth/Throat:     Mouth: Mucous membranes are moist.  Eyes:     General: Vision grossly intact. Gaze aligned appropriately.     Extraocular Movements: Extraocular movements intact.      Conjunctiva/sclera: Conjunctivae normal.  Cardiovascular:     Rate and Rhythm: Normal rate and regular rhythm.     Pulses: Normal pulses.     Heart sounds: Normal heart sounds, S1 normal and S2 normal. No murmur heard.    No friction rub. No gallop.  Pulmonary:     Effort: Tachypnea and accessory muscle usage present. No respiratory distress.     Breath sounds: Rales present.  Abdominal:     Palpations: Abdomen is soft.     Tenderness: There is no abdominal tenderness. There is no guarding or rebound.     Hernia: No hernia is present.  Musculoskeletal:        General: No swelling.     Cervical back: Full passive range of motion without pain, normal range of motion and neck supple. No pain with movement, spinous process tenderness or muscular tenderness. Normal range of motion.     Right lower leg: No edema.     Left lower leg: No edema.  Skin:    General: Skin is warm and dry.     Capillary Refill: Capillary refill takes less than 2 seconds.     Findings: No ecchymosis, erythema, lesion or wound.  Neurological:     Mental Status: He is alert and oriented to person, place, and time.     GCS: GCS eye subscore is 4. GCS verbal subscore is 5. GCS motor subscore is 6.     Cranial Nerves: Cranial nerves 2-12 are intact.     Sensory: Sensation is intact.     Motor: Motor function is intact. No weakness or abnormal muscle tone.     Coordination: Coordination is intact.  Psychiatric:        Mood and Affect: Mood normal.        Speech: Speech normal.        Behavior: Behavior normal.     ED Results / Procedures / Treatments   Labs (all labs ordered are listed, but only abnormal results are displayed) Labs Reviewed  CBC WITH DIFFERENTIAL/PLATELET - Abnormal; Notable for the following components:      Result Value   Lymphs Abs 4.6 (*)    Eosinophils Absolute 1.5 (*)    All other components within normal limits  BRAIN NATRIURETIC PEPTIDE - Abnormal; Notable for the following  components:   B Natriuretic Peptide 1,060.3 (*)    All other components within normal limits  PROTIME-INR - Abnormal; Notable for the following components:   Prothrombin Time 29.5 (*)    INR 2.8 (*)    All other components within normal limits  I-STAT VENOUS BLOOD GAS, ED - Abnormal; Notable for the following components:   pCO2, Ven 40.5 (*)    pO2, Ven 112 (*)  Bicarbonate 19.0 (*)    TCO2 20 (*)    Acid-base deficit 7.0 (*)    Potassium 8.3 (*)    Calcium, Ion 0.97 (*)    Hemoglobin 17.3 (*)    All other components within normal limits  SARS CORONAVIRUS 2 BY RT PCR  COMPREHENSIVE METABOLIC PANEL  TROPONIN I (HIGH SENSITIVITY)    EKG EKG Interpretation Date/Time:  Monday February 26 2023 02:28:35 EDT Ventricular Rate:  78 PR Interval:  223 QRS Duration:  157 QT Interval:  436 QTC Calculation: 497 R Axis:   -71  Text Interpretation: Sinus rhythm Prolonged PR interval Left atrial enlargement Left bundle branch block Confirmed by Gilda Crease ([id number]) on 02/26/2023 2:47:35 AM  Radiology DG Chest Port 1 View  Result Date: 02/26/2023 CLINICAL DATA:  Shortness of breath, respiratory distress EXAM: PORTABLE CHEST 1 VIEW COMPARISON:  10/15/2022 FINDINGS: Left AICD remains in place, unchanged. Cardiomegaly. Mediastinal contours within normal limits. Diffuse bilateral airspace disease, worsening on the right since prior study with some improvement on the left. No effusions or acute bony abnormality. IMPRESSION: Severe diffuse bilateral airspace disease, worsening on the right and improving on the left. This could reflect edema or infection. Electronically Signed   By: Charlett Nose M.D.   On: 02/26/2023 02:46    Procedures Procedures    Medications Ordered in ED Medications  furosemide (LASIX) injection 40 mg (has no administration in time range)    ED Course/ Medical Decision Making/ A&P                                 Medical Decision Making Amount and/or  Complexity of Data Reviewed External Data Reviewed: labs, radiology, ECG and notes. Labs: ordered. Decision-making details documented in ED Course. Radiology: ordered and independent interpretation performed. Decision-making details documented in ED Course. ECG/medicine tests: ordered and independent interpretation performed. Decision-making details documented in ED Course.  Risk Prescription drug management.   Differential Diagnosis considered includes, but not limited to: CHF; ACS; pneumonia; COVID-19;   Patient with history of ischemic cardiomyopathy, on Demadex, spironolactone and Entresto presents to the emergency department for evaluation of shortness of breath.  Symptoms developed tonight.  EMS report severe hypoxia and respiratory distress which improved with CPAP.  Patient still hypoxic at arrival, oxygen saturations low 80s despite EMS interventions.  At arrival, patient has significant rales.  He did significantly improve symptomatically on BiPAP, oxygen saturations improved as well.  Chest x-ray concerning for pulmonary edema.  Initiate diuresis, continue BiPAP.  Admit for further management.  CRITICAL CARE Performed by: Gilda Crease   Total critical care time: 30 minutes  Critical care time was exclusive of separately billable procedures and treating other patients.  Critical care was necessary to treat or prevent imminent or life-threatening deterioration.  Critical care was time spent personally by me on the following activities: development of treatment plan with patient and/or surrogate as well as nursing, discussions with consultants, evaluation of patient's response to treatment, examination of patient, obtaining history from patient or surrogate, ordering and performing treatments and interventions, ordering and review of laboratory studies, ordering and review of radiographic studies, pulse oximetry and re-evaluation of patient's  condition.         Final Clinical Impression(s) / ED Diagnoses Final diagnoses:  Acute respiratory failure with hypoxia (HCC)  Acute on chronic congestive heart failure, unspecified heart failure type (HCC)    Rx / DC  Orders ED Discharge Orders     None         Luisfernando Brightwell, Canary Brim, MD 02/26/23 [phone]

## 2023-02-27 ENCOUNTER — Other Ambulatory Visit (HOSPITAL_COMMUNITY): Payer: Self-pay

## 2023-02-27 ENCOUNTER — Inpatient Hospital Stay (HOSPITAL_COMMUNITY): Payer: 59

## 2023-02-27 DIAGNOSIS — N1831 Chronic kidney disease, stage 3a: Secondary | ICD-10-CM | POA: Diagnosis not present

## 2023-02-27 DIAGNOSIS — I5023 Acute on chronic systolic (congestive) heart failure: Secondary | ICD-10-CM | POA: Diagnosis not present

## 2023-02-27 DIAGNOSIS — J9601 Acute respiratory failure with hypoxia: Principal | ICD-10-CM

## 2023-02-27 LAB — CBC
HCT: 41.9 % (ref 39.0–52.0)
Hemoglobin: 13.8 g/dL (ref 13.0–17.0)
MCH: 30.7 pg (ref 26.0–34.0)
MCHC: 32.9 g/dL (ref 30.0–36.0)
MCV: 93.3 fL (ref 80.0–100.0)
Platelets: 138 10*3/uL — ABNORMAL LOW (ref 150–400)
RBC: 4.49 MIL/uL (ref 4.22–5.81)
RDW: 14.3 % (ref 11.5–15.5)
WBC: 10.3 10*3/uL (ref 4.0–10.5)
nRBC: 0 % (ref 0.0–0.2)

## 2023-02-27 LAB — BASIC METABOLIC PANEL
Anion gap: 8 (ref 5–15)
BUN: 34 mg/dL — ABNORMAL HIGH (ref 8–23)
CO2: 22 mmol/L (ref 22–32)
Calcium: 8.5 mg/dL — ABNORMAL LOW (ref 8.9–10.3)
Chloride: 105 mmol/L (ref 98–111)
Creatinine, Ser: 2.06 mg/dL — ABNORMAL HIGH (ref 0.61–1.24)
GFR, Estimated: 34 mL/min — ABNORMAL LOW (ref >60)
Glucose, Bld: 127 mg/dL — ABNORMAL HIGH (ref 70–99)
Potassium: 4 mmol/L (ref 3.5–5.1)
Sodium: 135 mmol/L (ref 135–145)

## 2023-02-27 LAB — ECHOCARDIOGRAM COMPLETE
AV Mean grad: 3 mmHg
AV Peak grad: 4.4 mmHg
Ao pk vel: 1.05 m/s
Area-P 1/2: 4 cm2
Est EF: <20
Height: 69 in
S' Lateral: 5.8 cm
Weight: 2536.17 [oz_av]

## 2023-02-27 LAB — PROTIME-INR
INR: 2.9 — ABNORMAL HIGH (ref 0.8–1.2)
Prothrombin Time: 30.5 s — ABNORMAL HIGH (ref 11.4–15.2)

## 2023-02-27 MED ORDER — FUROSEMIDE 10 MG/ML IJ SOLN
80.0000 mg | Freq: Once | INTRAMUSCULAR | Status: AC
  Administered 2023-02-27: 80 mg via INTRAVENOUS
  Filled 2023-02-27: qty 8

## 2023-02-27 MED ORDER — WARFARIN SODIUM 2.5 MG PO TABS: 2.5000 mg | ORAL_TABLET | Freq: Once | ORAL | Status: DC

## 2023-02-27 MED ORDER — AMIODARONE HCL 200 MG PO TABS
200.0000 mg | ORAL_TABLET | Freq: Every day | ORAL | Status: DC
  Administered 2023-02-28 – 2023-03-09 (×8): 200 mg via ORAL
  Filled 2023-02-27 (×9): qty 1

## 2023-02-27 MED ORDER — FUROSEMIDE 10 MG/ML IJ SOLN
80.0000 mg | Freq: Two times a day (BID) | INTRAMUSCULAR | Status: DC
Start: 2023-02-27 — End: 2023-02-27

## 2023-02-27 MED ORDER — FUROSEMIDE 10 MG/ML IJ SOLN: 40.0000 mg | Freq: Two times a day (BID) | INTRAMUSCULAR | Status: DC

## 2023-02-27 MED ORDER — SODIUM CHLORIDE 0.9 % IV SOLN: INTRAVENOUS | Status: DC

## 2023-02-27 MED ORDER — PERFLUTREN LIPID MICROSPHERE
1.0000 mL | INTRAVENOUS | Status: AC | PRN
  Administered 2023-02-27: 2 mL via INTRAVENOUS

## 2023-02-27 MED ORDER — SPIRONOLACTONE 12.5 MG HALF TABLET
12.5000 mg | ORAL_TABLET | Freq: Every day | ORAL | Status: DC
  Administered 2023-02-28 – 2023-03-02 (×3): 12.5 mg via ORAL
  Filled 2023-02-27 (×3): qty 1

## 2023-02-27 NOTE — Progress Notes (Signed)
Informed Consent   Shared Decision Making/Informed Consent{  The risks [stroke (1 in 1000), death (1 in 1000), kidney failure [usually temporary] (1 in 500), bleeding (1 in 200), allergic reaction [possibly serious] (1 in 200)], benefits (diagnostic support and management of coronary artery disease) and alternatives of a right cardiac catheterization were discussed in detail with Steven [name] and he is willing to proceed.

## 2023-02-27 NOTE — Progress Notes (Signed)
Heart Failure Stewardship Pharmacist Progress Note   PCP: Ziglar, Eli Phillips, MD PCP-Cardiologist: Nicki Guadalajara, MD    HPI:  67 yo M with CHF, CAD, VT s/p ICD, HLD, apical mural thrombus, CKD IIIa, and SDH.   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 He presented after a three-hour delay to the hospital and was taken emergently for cath with DES to LAD.   Followed by Dr Tresa Endo and Dr Royann Shivers for many years.    Admitted in 04/2022 with acute CHF. ECHO showed LVEF <20% and RV moderately reduced. Diuresed with IV lasix and placed on GDMT. Followed up with HF TOC clinic on 05/22/22 and was doing well. Unable to take full dose spironolactone due to chest discomfort. Discussed possible f/u with Advanced Heart Failure and suggested CPX for risk stratification however he wanted to discuss with Dr Tresa Endo.   Last admitted 09/2022 with acute on chronic CHF. BNP 2147. He was diuresed from 177>154 lbs and discharged with torsemide. Referred to HF TOC and discussed referral to AHF clinic if he were to need advanced therapies down the road. Patient declined and was referred back to general cardiology.  Presented to the ED on 9/2 with shortness of breath, PND, and orthopnea. CXR with severe diffuse bilateral airspace disease. ECHO pending.   Patient states he was doing well up to a few hours before his admission. He normally fluctuates 1-2 lbs per day (especially on the days he does not take torsemide). Baseline weight around 154 lbs at home. Reports his weight was 161 lbs the day of admission.   Current HF Medications: Diuretic: furosemide 40 mg IV BID MRA: spironolactone 12.5 mg every other day SGLT2i: Farxiga 10 mg daily  Prior to admission HF Medications: Diuretic: torsemide 20 mg every other day Beta blocker: metoprolol XL 25 mg daily ACE/ARB/ARNI: Entresto 24/26 mg BID MRA: spironolactone 12.5 mg every other day SGLT2i:  Farxiga 10 mg daily  Pertinent Lab Values: Serum creatinine 2.06, BUN 34, Potassium 4.0, Sodium 135, BNP 1060.3  Vital Signs: Weight: 158 lbs (admission weight: 158 lbs) Blood pressure: 100/60s  Heart rate: 60s  I/O: net -3.4L yesterday; net -4.8L since admission  Medication Assistance / Insurance Benefits Check: Does the patient have prescription insurance?  Yes Type of insurance plan: [place] [employer]  Outpatient Pharmacy:  Prior to admission outpatient pharmacy: [facility] Is the patient willing to use [facility] TOC pharmacy at discharge? Yes Is the patient willing to transition their outpatient pharmacy to utilize a [facility] outpatient pharmacy?   Yes    Assessment: 1. Acute on chronic systolic CHF (LVEF <20%), due to ICM. NYHA class IV symptoms. - Furosemide decreased to 40 mg IV BID with rising creatinine. He states his baseline weight is around 154 lbs. Consider increasing dose this afternoon. - Holding PTA metoprolol with decompensated HF and hypotension. Concern for low output. Lactic acid 1.9. May need R/LHC.  - Also holding PTA Entresto with hypotension - Continue spironolactone 12.5 mg every other day. May need to hold if hypotension persists. - Continue Farxiga 10 mg daily - He states he is willing to come back to HF [facility] clinic to discuss advanced therapies further. He is concerned about missing work for possible surgeries or procedures but understands that advanced therapies could improve his quality and length of life. All questions answered.   Plan: 1) Medication changes recommended at this time: - Increase IV lasix dose this afternoon  2) Patient assistance: - None pending  3)  Education  - Patient has been educated on current HF medications and potential additions to HF medication regimen - Patient verbalizes understanding that over the next few months, these medication doses may change and more medications may be added to optimize HF regimen - Patient has been educated  on basic disease state pathophysiology and goals of therapy   Sharen Hones, PharmD, BCPS Heart Failure Stewardship Pharmacist Phone [phone]

## 2023-02-27 NOTE — Plan of Care (Signed)

## 2023-02-27 NOTE — Progress Notes (Signed)
PROGRESS NOTE    Steven [name]  IHK:[id number] DOB: June 07, 1956 DOA: 02/26/2023 PCP: [name], MD    Brief Narrative:   Steven [name] is a 67 y.o. male with past medical history significant for chronic systolic congestive heart failure (LVEF <20%), ischemic cardiomyopathy, history of LV thrombus, history of ventricular tachycardia s/p ICD, CKD stage IIIb, hyperlipidemia, hypertension who presented to [facility] ED on 9/2 from home via EMS with acute onset shortness of breath.  Also associated with nausea.  Dyspnea worse with exertion.  Denies lower extremity edema but endorses orthopnea/PND.  Patient was recently seen in cardiology outpatient clinic, his torsemide was decreased to 20 mg every other day due to decreased GFR.  Patient reports compliance with his medications and adherence to a salt/fluid restricted diet.  Patient denies chest pain, no abdominal pain. On EMS arrival patient was noted to be tachypneic with elevated JVD and SpO2 78% on room air.  Patient was given nitroglycerin, placed on CPAP and transported to the ED for further evaluation.  In the ED, temperature 96.8 F, HR 85, RR 31, BP 127/66, SpO2 92% on BiPAP.  VBG with pH 7.280, pCO2 40.5, pO2 112.  WBC 9.9, hemoglobin 16.3, platelet count 225.  Sodium 135, potassium 4.0, chloride 108, CO2 18, glucose 166, BUN 38, creatinine 1.89.  AST 70, ALT 46, total bilirubin 0.8.  High sensitive troponin 7 followed by 13.  BNP 1060.3.  SARS Cov 2 PCR negative.  Chest x-ray with severe diffuse bilateral airspace disease worsening on the right and improving on left, reflective of edema versus infection.  Assessment & Plan:    Acute hypoxic/hypercapnic respiratory failure, POA Acute on chronic systolic congestive heart failure Pulmonary edema Patient presenting with acute onset shortness of breath 2 hours prior to arrival to the ED.  Patient was found tachypneic and hypoxic with SpO2 78% on room air.  Not oxygen dependent at  baseline.  Recently had dose reduction of his torsemide outpatient due to worsening renal function.  Patient states he hears to salt/fluid restricted diet.  Patient was on BiPAP on arrival with a VBG with elevated pCO2 of 40.5.  Chest x-ray with findings consistent with pulmonary edema.  COVID PCR negative. TTE November 2023 with LVEF less than 20% with LV global hypokinesis, LV moderately dilated, diastolic parameters indeterminate, RV systolic function moderately reduced, biatrial enlargement, trivial MR, no aortic stenosis, IVC dilated, adherent mass inferior septal apical portion consistent with laminated/chronic thrombus, no mobile aspect seen. -- Cardiology consulted -- Net negative 2.9L past 24h and net negative 3.8L since admission -- wt 71.8>71.9kg -- TTE: Pending -- Reduce Lasix to 40 mg IV every 12 given increased creatinine -- Spironolactone 12.5 mg p.o. every other day -- Farxiga 10 mg p.o. daily -- Holding home metoprolol succinate and Entresto due to borderline hypotension -- 1500 mL fluid restriction -- Strict I's and O's and daily weights -- BMP daily  Transaminitis LFTs elevated, likely secondary to hepatic congestion in the setting of CHF exacerbation as above. -- CMP daily  History of LV thrombus -- Continue Coumadin, pharmacy consulted for dosing/monitoring -- INR daily  History of ventricular tachycardia s/p ICD -- Amiodarone 200 mg p.o. twice daily -- Continue to monitor on telemetry  Paroxysmal atrial fibrillation -- Continue Coumadin -- Holding home metoprolol succinate for borderline hypotension  CKD stage IIIb Baseline creatinine 1.3-1.7.  CAD Ischemic cardiomyopathy -- Continue statin  Essential hypertension -- IV Lasix as above --Spironolactone 12.5 mg p.o.  every other day -- Holding home metoprolol succinate, Entresto as above  Hyperlipidemia --Atorvastatin 80 mg p.o. daily  Malignant neoplasm of prostate -- Continue outpatient follow-up with  oncology  DVT prophylaxis: SCDs Start: 02/26/23 1502 warfarin (COUMADIN) tablet 2.5 mg    Code Status: Full Code Family Communication: No family present at bedside this morning  Disposition Plan:  Level of care: Telemetry Cardiac Status is: Inpatient Remains inpatient appropriate because: IV diuresis    Consultants:  Cardiology  Procedures:  TTE: Pending  Antimicrobials:  None   Subjective: Patient seen examined at bedside, lying in bed.  Frustrated about being "back in the hospital".  Reports dyspnea improved.  No other specific complaints, questions or concerns at this time.  Denies headache, no visual changes, no chest pain, no palpitations, no nausea/vomit/diarrhea, no abdominal pain, no fever/chills/night sweats, no fatigue, no focal weakness, no paresthesias.  No acute events overnight per nursing staff.  Objective: Vitals:   02/27/23 0029 02/27/23 0442 02/27/23 0842 02/27/23 0843  BP: 90/63 100/64 96/65   Pulse: 61 68  62  Resp: 18 18    Temp: 98.5 F (36.9 C) 98.5 F (36.9 C)    TempSrc: Oral Oral    SpO2: 94% 97%  97%  Weight:  71.9 kg    Height:        Intake/Output Summary (Last 24 hours) at 02/27/2023 0931 Last data filed at 02/27/2023 0800 Gross per 24 hour  Intake 600 ml  Output 4050 ml  Net -3450 ml   Filed Weights   02/26/23 1437 02/27/23 0442  Weight: 71.8 kg 71.9 kg    Examination:  Physical Exam: GEN: NAD, alert and oriented x 3, chronically ill in appearance, appears older than stated age HEENT: NCAT, PERRL, EOMI, sclera clear, MMM PULM: Diminished breath sounds bilateral bases with crackles, no wheezing, normal respiratory effort without accessory muscle use, on 3 L nasal cannula with SpO2 97% at rest CV: RRR w/o M/G/R GI: abd soft, NTND, NABS, no R/G/M MSK: no peripheral edema, moves all extremities independently with preserved strength NEURO: CN II-XII intact, no focal deficits, sensation to light touch intact PSYCH: normal  mood/affect Integumentary: dry/intact, no rashes or wounds    Data Reviewed: I have personally reviewed following labs and imaging studies  CBC: Recent Labs  Lab 02/26/23 0231 02/26/23 0253 02/27/23 0416  WBC 9.9  --  10.3  NEUTROABS 2.8  --   --   HGB 16.3 17.3* 13.8  HCT 50.5 51.0 41.9  MCV 97.7  --  93.3  PLT 225  --  138*   Basic Metabolic Panel: Recent Labs  Lab 02/26/23 0253 02/26/23 0410 02/27/23 0416  NA 135 135 135  K 8.3* 4.0 4.0  CL  --  108 105  CO2  --  18* 22  GLUCOSE  --  166* 127*  BUN  --  38* 34*  CREATININE  --  1.89* 2.06*  CALCIUM  --  8.0* 8.5*   GFR: Estimated Creatinine Clearance: 34.3 mL/min (A) (by C-G formula based on SCr of 2.06 mg/dL (H)). Liver Function Tests: Recent Labs  Lab 02/26/23 0410  AST 70*  ALT 46*  ALKPHOS 73  BILITOT 0.8  PROT 6.0*  ALBUMIN 3.0*   No results for input(s): "LIPASE", "AMYLASE" in the last 168 hours. No results for input(s): "AMMONIA" in the last 168 hours. Coagulation Profile: Recent Labs  Lab 02/21/23 0906 02/26/23 0231 02/27/23 0416  INR 2.7 2.8* 2.9*   Cardiac Enzymes:  No results for input(s): "CKTOTAL", "CKMB", "CKMBINDEX", "TROPONINI" in the last 168 hours. BNP (last 3 results) Recent Labs    04/18/22 0958  PROBNP 587*   HbA1C: No results for input(s): "HGBA1C" in the last 72 hours. CBG: No results for input(s): "GLUCAP" in the last 168 hours. Lipid Profile: No results for input(s): "CHOL", "HDL", "LDLCALC", "TRIG", "CHOLHDL", "LDLDIRECT" in the last 72 hours. Thyroid Function Tests: No results for input(s): "TSH", "T4TOTAL", "FREET4", "T3FREE", "THYROIDAB" in the last 72 hours. Anemia Panel: No results for input(s): "VITAMINB12", "FOLATE", "FERRITIN", "TIBC", "IRON", "RETICCTPCT" in the last 72 hours. Sepsis Labs: Recent Labs  Lab 02/26/23 1215 02/26/23 1615  LATICACIDVEN 1.7 1.9    Recent Results (from the past 240 hour(s))  SARS Coronavirus 2 by RT PCR (hospital order,  performed in [facility] hospital lab) *cepheid single result test* Anterior Nasal Swab     Status: None   Collection Time: 02/26/23  2:25 AM   Specimen: Anterior Nasal Swab  Result Value Ref Range Status   SARS Coronavirus 2 by RT PCR NEGATIVE NEGATIVE Final    Comment: Performed at [facility], [address] [place], [place] [address]         Radiology Studies: DG Chest Port 1 View  Result Date: 02/26/2023 CLINICAL DATA:  Shortness of breath, respiratory distress EXAM: PORTABLE CHEST 1 VIEW COMPARISON:  10/15/2022 FINDINGS: Left AICD remains in place, unchanged. Cardiomegaly. Mediastinal contours within normal limits. Diffuse bilateral airspace disease, worsening on the right since prior study with some improvement on the left. No effusions or acute bony abnormality. IMPRESSION: Severe diffuse bilateral airspace disease, worsening on the right and improving on the left. This could reflect edema or infection. Electronically Signed   By: Charlett Nose M.D.   On: 02/26/2023 02:46        Scheduled Meds:  amiodarone  200 mg Oral BID   atorvastatin  80 mg Oral Daily   dapagliflozin propanediol  10 mg Oral Daily   furosemide  40 mg Intravenous Q12H   spironolactone  12.5 mg Oral QODAY   tamsulosin  0.4 mg Oral QPC supper   warfarin  2.5 mg Oral ONCE-1600   Warfarin - Pharmacist Dosing Inpatient   Does not apply q1600   Continuous Infusions:   LOS: 1 day    Time spent: 52 minutes spent on chart review, discussion with nursing staff, consultants, updating family and interview/physical exam; more than 50% of that time was spent in counseling and/or coordination of care.    Alvira Philips [place], DO [facility] Available via Epic secure chat 7am-7pm After these hours, please refer to coverage provider listed on [email or web address] 02/27/2023, 9:31 AM

## 2023-02-27 NOTE — Progress Notes (Addendum)
Mobility Specialist Progress Note:    02/27/23 1428  Mobility  Activity Ambulated independently in hallway  Level of Assistance Contact guard assist, steadying assist  Assistive Device Front wheel walker  Distance Ambulated (ft) 800 ft  Activity Response Tolerated well  Mobility Referral Yes  $Mobility charge 1 Mobility  Mobility Specialist Start Time (ACUTE ONLY) 1350  Mobility Specialist Stop Time (ACUTE ONLY) 1403  Mobility Specialist Time Calculation (min) (ACUTE ONLY) 13 min   Received pt in bed having no complaints and agreeable to mobility. Ambulated on 2L/min, VSS. Pt was asymptomatic throughout ambulation and returned to room w/o fault. Left in bed w/ call bell in reach and all needs met.   Thompson Grayer Mobility Specialist  Please contact vis Secure Chat or  Rehab Office [phone]

## 2023-02-27 NOTE — Progress Notes (Addendum)
ANTICOAGULATION CONSULT NOTE  Pharmacy Consult for Warfarin Indication:  LV Thrombus  No Known Allergies  Patient Measurements: Height: 5\' 9"  (175.3 cm) Weight: 71.9 kg (158 lb 8.2 oz) IBW/kg (Calculated) : 70.7  Vital Signs: Temp: 98.5 F (36.9 C) (09/03 0442) Temp Source: Oral (09/03 0442) BP: 96/65 (09/03 0842) Pulse Rate: 62 (09/03 0843)  Labs: Recent Labs    02/26/23 0231 02/26/23 0253 02/26/23 0410 02/27/23 0416  HGB 16.3 17.3*  --  13.8  HCT 50.5 51.0  --  41.9  PLT 225  --   --  138*  LABPROT 29.5*  --   --  30.5*  INR 2.8*  --   --  2.9*  CREATININE  --   --  1.89* 2.06*  TROPONINIHS 7  --  13  --     Estimated Creatinine Clearance: 34.3 mL/min (A) (by C-G formula based on SCr of 2.06 mg/dL (H)).  Assessment: 26 yom with a history of HFrEF, CAD, VT, LV thrombus, HTN, HLD, and CKD III. Patient is presenting with respiratory distress. Warfarin per pharmacy consult placed for  LV Thrombus .  Patient taking warfarin prior to arrival. Home dose is 5 mg (5 mg x 1) every Tue, Thu; 2.5 mg (5 mg x 0.5) all other days. Last taken 9/1 at 1230.  INR is therapeutic at 2.9 today. No bleeding noted, Hgb down to 13.8, platelets down to 138. Amiodarone started ~02/01/23 which can affect INR.  Goal of Therapy:  INR Goal 2-3 Monitor platelets by anticoagulation protocol: Yes   Plan:  Warfarin 2.5 mg PO today Daily INR Monitor for s/sx of bleeding Watch for new DDIs  Thank you for involving pharmacy in this patient's care.  Loura Back, PharmD, BCPS Clinical Pharmacist Clinical phone for 02/27/2023 is [phone] 02/27/2023 9:16 AM  Addendum: Holding warfarin for RHC F/u plan to resume  Loura Back, PharmD, BCPS 3:11 PM

## 2023-02-27 NOTE — Consult Note (Addendum)
Cardiology Consultation   Patient ID: Steven [name] MRN: [id number]; DOB: September 05, 1955  Admit date: 02/26/2023 Date of Consult: 02/27/2023  PCP:  [name], MD   [facility] Providers Cardiologist:  Nicki Guadalajara, MD  Electrophysiologist:  Thurmon Fair, MD  {  Patient Profile:   Steven [name] is a 67 y.o. male with a hx of  CAD with STEMI s/p BMS to LAD in 06/2010 and angioplasty to OM1 in 11/2010, ischemic cardiomyopathy/ chronic HFrEF with EF <20 on most recent Echo in 04/2022 s/p ICD in 02/2011 (gen change in 01/2019), LV thrombus on Coumadin, non-sustained VT, hypertension, hyperlipidemia, pre-diabetes, and CKD stage III  who is being seen 02/27/2023 for the evaluation of CHF at the request of Dr. [place].  History of Present Illness:   Steven [name] has history of CAD dating back to 2012 where he had suffered a anterior MI.  He has ischemic cardiomyopathy and has had persistently low EF 20 to 25% with ICD placement.  He is also chronically on Coumadin for mural thrombus noted in 2015 as well as and subsequent echocardiogram in November 2023.  More recently within the past year or 2 he has had heart failure exacerbation admissions seen by advanced heart failure. Diuresis outpatient has been difficult due to underlying CKD.  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  He had 2 failed rounds of ATP and then eventually was shocked, however patient unaware.  He has history of VT however was noted to be more remote prior to this episode.  He was started on amiodarone loading dose regiment.  His most recent echocardiogram was in November 2023 that indicated EF less than 20% and adherent mass inferior septal apical portion consistent with a chronic thrombus.  He has been moderately reduced RV function.  Currently patient is again being evaluated for acute on chronic HFrEF exacerbation.  He states that he has been  compliant with all of his medications and has been doing decently overall.  At his July outpatient visit torsemide was decreased to 20 mg every other day due to worsening renal function.  He denied any preceding symptoms however had an event yesterday where he became acutely short of breath.  On presentation he was severely volume up and tachypneic with hypoxia with oxygen saturations at 78% and was placed on BiPAP and given nitroglycerin. BNP 1060.  Neg trops. Chest x-ray with severe diffuse bilateral edema.  He was started on IV Lasix 60 mg with very good output and significant improvement in symptoms.  This has been decreased to 40 mg.  Overall patient feels significantly better than when he arrived.  Still has some shortness of breath and requiring supplemental oxygen however feels much more comfortable.  He reports compliancy with all his medications and is wondering why he was doing so well but has had multiple events this year.  Also had another admission in April of this year.  He denies any chest pain, orthopnea, peripheral edema, syncope, ICD shocks.  Past Medical History:  Diagnosis Date   Acute MI, anterior wall (HCC)    AICD (automatic cardioverter/defibrillator) present    CAD (coronary artery disease)    2D ECHO, 07/13/2011 - EF <25%, LV moderatelty dilated, LA moderately dilatedLEXISCAN, 12/14/2011 - moderate-severe perfusion defect seen in the basal anteroseptal, mid anterior, apicacl anterior, apical, apical inferior, and apical lateral regions, post-stress EF 25%, new EKG  changes from baseline abnormalities   [facility])    Prostate   CHF (congestive heart failure) (HCC) 2012   Hypertension 08/08/2021   Inguinal hernia, left    Pneumonia    November 2023   Pre-diabetes     Past Surgical History:  Procedure Laterality Date   CARDIAC CATHETERIZATION  11/25/2010   Predilation balloon-Apex monorail 2x55mm, Cutting balloon-2.25x23mm, resulting in a reduction of 100% stenosis down  to less than 10%   CARDIAC CATHETERIZATION  07/12/2010   LAD stented with a 3.5x78mm bare-metal Veriflex stent resulting in a reduction of 100% lesion to 0%   CARDIAC DEFIBRILLATOR PLACEMENT  03/02/2011   Medtronic Protecta XT DR, model #B638LHT, serial #[id number] H   COLONOSCOPY     GOLD SEED IMPLANT N/A 07/21/2021   Procedure: GOLD SEED IMPLANT;  Surgeon: Heloise Purpura, MD;  Location: [facility];  Service: Urology;  Laterality: N/A;  NEEDS 30 MIN   HERNIA REPAIR  2018   ICD GENERATOR CHANGEOUT N/A 02/24/2019   Procedure: ICD GENERATOR CHANGEOUT;  Surgeon: Thurmon Fair, MD;  Location: [facility] INVASIVE CV LAB;  Service: Cardiovascular;  Laterality: N/A;   PACEMAKER PLACEMENT  07/14/2010   Temporary placement of pacemaker, if rhythm issue continues will need a permanent device   SPACE OAR INSTILLATION N/A 07/21/2021   Procedure: SPACE OAR INSTILLATION;  Surgeon: Heloise Purpura, MD;  Location: [facility];  Service: Urology;  Laterality: N/A;     Inpatient Medications: Scheduled Meds:  amiodarone  200 mg Oral BID   atorvastatin  80 mg Oral Daily   dapagliflozin propanediol  10 mg Oral Daily   furosemide  40 mg Intravenous Q12H   spironolactone  12.5 mg Oral QODAY   tamsulosin  0.4 mg Oral QPC supper   warfarin  2.5 mg Oral ONCE-1600   Warfarin - Pharmacist Dosing Inpatient   Does not apply q1600   Continuous Infusions:  PRN Meds: acetaminophen **OR** acetaminophen, ondansetron **OR** ondansetron (ZOFRAN) IV, mouth rinse  Allergies:   No Known Allergies  Social History:   Social History   Socioeconomic History   Marital status: Single    Spouse name: Not on file   Number of children: 1   Years of education: Not on file   Highest education level: Bachelor's degree (e.g., BA, AB, BS)  Occupational History   Occupation: works on Systems analyst  Tobacco Use   Smoking status: Former    Current packs/day: 0.00    Types: Cigarettes, Cigars    Quit date: 2012    Years since quitting: 12.6    Smokeless tobacco: Never  Vaping Use   Vaping status: Never Used  Substance and Sexual Activity   Alcohol use: Yes    Alcohol/week: 2.0 - 3.0 standard drinks of alcohol    Types: 2 - 3 drink(s) per week   Drug use: No   Sexual activity: Not Currently  Other Topics Concern   Not on file  Social History Narrative   Not on file   Social Determinants of Health   Financial Resource Strain: Low Risk  (05/08/2022)   Overall Financial Resource Strain (CARDIA)    Difficulty of Paying Living Expenses: Not hard at all  Food Insecurity: No Food Insecurity (02/26/2023)   Hunger Vital Sign    Worried About Running Out of Food in the Last Year: Never true    Ran Out of Food in the Last Year: Never true  Transportation Needs: No Transportation Needs (02/26/2023)   PRAPARE - Transportation  Lack of Transportation (Medical): No    Lack of Transportation (Non-Medical): No  Physical Activity: Not on file  Stress: Not on file  Social Connections: Unknown (11/08/2021)   Received from [facility]   Social Network    Social Network: Not on file  Intimate Partner Violence: Not At Risk (02/26/2023)   Humiliation, Afraid, Rape, and Kick questionnaire    Fear of Current or Ex-Partner: No    Emotionally Abused: No    Physically Abused: No    Sexually Abused: No    Family History:   History reviewed. No pertinent family history.   ROS:  Please see the history of present illness.  All other ROS reviewed and negative.     Physical Exam/Data:   Vitals:   02/27/23 0029 02/27/23 0442 02/27/23 0842 02/27/23 0843  BP: 90/63 100/64 96/65   Pulse: 61 68  62  Resp: 18 18    Temp: 98.5 F (36.9 C) 98.5 F (36.9 C)    TempSrc: Oral Oral    SpO2: 94% 97%  97%  Weight:  71.9 kg    Height:        Intake/Output Summary (Last 24 hours) at 02/27/2023 1341 Last data filed at 02/27/2023 0800 Gross per 24 hour  Intake 600 ml  Output 4050 ml  Net -3450 ml      02/27/2023    4:42 AM 02/26/2023    2:37 PM  02/01/2023   12:42 PM  Last 3 Weights  Weight (lbs) 158 lb 8.2 oz 158 lb 4.6 oz 157 lb  Weight (kg) 71.9 kg 71.8 kg 71.215 kg     Body mass index is 23.41 kg/m.  General:  Well nourished, well developed, in no acute distress.  On 3 L via nasal cannula HEENT: normal Neck: + JVD Vascular: No carotid bruits; Distal pulses 2+ bilaterally Cardiac:  normal S1, S2; RRR; no murmur  Lungs: Diffuse crackles Abd: soft, nontender, no hepatomegaly  Ext: no edema Musculoskeletal:  No deformities, BUE and BLE strength normal and equal Skin: warm and dry  Neuro:  CNs 2-12 intact, no focal abnormalities noted Psych:  Normal affect   EKG:  The EKG was personally reviewed and demonstrates: Normal sinus rhythm, heart rate 78.  First-degree AV block.  PR 223.  Left anterior fascicular block.  Borderline prolonged QTc 497.  Nonspecific T wave changes throughout.  Similar to previous readings. Telemetry:  Telemetry was personally reviewed and demonstrates: Intermittently atrially paced with heart rates in the 60s  Relevant CV Studies: Echocardiogram 05/09/2022  1. On contrast images, there is an adherent mass at the inferoseptal  apical portion that is consistent with laminated/chronic thrombus. No  mobile aspects seen and not appreciated in other views. Left ventricular  ejection fraction, by estimation, is  <20%. The left ventricle has severely decreased function. The left  ventricle demonstrates global hypokinesis. The left ventricular internal  cavity size was moderately dilated. Left ventricular diastolic parameters  are indeterminate.   2. Right ventricular systolic function is moderately reduced. The right  ventricular size is normal. There is mildly elevated pulmonary artery  systolic pressure.   3. Left atrial size was mild to moderately dilated.   4. Right atrial size was mildly dilated.   5. The mitral valve is normal in structure. Trivial mitral valve  regurgitation. No evidence of mitral  stenosis.   6. The aortic valve is grossly normal. Aortic valve regurgitation is not  visualized. No aortic stenosis is present.  7. The inferior vena cava is dilated in size with <50% respiratory  variability, suggesting right atrial pressure of 15 mmHg.   Comparison(s): Changes from prior study are noted. Prior EF 20-25%, now  <20%.   Laboratory Data:  High Sensitivity Troponin:   Recent Labs  Lab 02/01/23 1312 02/01/23 1447 02/26/23 0231 02/26/23 0410  TROPONINIHS 19* 17 7 13      Chemistry Recent Labs  Lab 02/26/23 0253 02/26/23 0410 02/27/23 0416  NA 135 135 135  K 8.3* 4.0 4.0  CL  --  108 105  CO2  --  18* 22  GLUCOSE  --  166* 127*  BUN  --  38* 34*  CREATININE  --  1.89* 2.06*  CALCIUM  --  8.0* 8.5*  GFRNONAA  --  38* 34*  ANIONGAP  --  9 8    Recent Labs  Lab 02/26/23 0410  PROT 6.0*  ALBUMIN 3.0*  AST 70*  ALT 46*  ALKPHOS 73  BILITOT 0.8   Lipids No results for input(s): "CHOL", "TRIG", "HDL", "LABVLDL", "LDLCALC", "CHOLHDL" in the last 168 hours.  Hematology Recent Labs  Lab 02/26/23 0231 02/26/23 0253 02/27/23 0416  WBC 9.9  --  10.3  RBC 5.17  --  4.49  HGB 16.3 17.3* 13.8  HCT 50.5 51.0 41.9  MCV 97.7  --  93.3  MCH 31.5  --  30.7  MCHC 32.3  --  32.9  RDW 14.6  --  14.3  PLT 225  --  138*   Thyroid No results for input(s): "TSH", "FREET4" in the last 168 hours.  BNP Recent Labs  Lab 02/26/23 0231  BNP 1,060.3*    DDimer No results for input(s): "DDIMER" in the last 168 hours.   Radiology/Studies:  DG Chest Port 1 View  Result Date: 02/26/2023 CLINICAL DATA:  Shortness of breath, respiratory distress EXAM: PORTABLE CHEST 1 VIEW COMPARISON:  10/15/2022 FINDINGS: Left AICD remains in place, unchanged. Cardiomegaly. Mediastinal contours within normal limits. Diffuse bilateral airspace disease, worsening on the right since prior study with some improvement on the left. No effusions or acute bony abnormality. IMPRESSION: Severe  diffuse bilateral airspace disease, worsening on the right and improving on the left. This could reflect edema or infection. Electronically Signed   By: Charlett Nose M.D.   On: 02/26/2023 02:46     Assessment and Plan:   Chronic HFrEF Ischemic cardiomyopathy with ICD Last echocardiogram was in November 2023 showed EF less than 20% with global hypokinesis and moderately reduced RV function.  Has been difficult manage outpatient diureses due to renal function. He was being dosed at torsemide 20 mg every other day.  He presented yesterday with what appears to be flash pulmonary edema yesterday and was initially placed on BiPAP, given nitro, started on IV Lasix 60 mg with significant improvement in symptoms as well as adequate diuresis.  He put out 3.5 L.  However, he still has clear signs of volume (JVD and pulmonary congestion) and still requiring supplemental oxygen currently on 3 L.  Patient has somewhat narrow pulse pressures 96-65 and not tolerating GDMT. Will plan for R heart cath, consent will be placed by MD.  Continue Farxiga 10 mg, spironolactone 12.5 mg every day.  Entresto and beta-blocker has been held due to soft pressures.  Increasing IV lasix 80mg  for one time right now and reevaluate after RHC tomorrow for ongoing dosing.  Echo pending.  Had normal device function 02/20/2023  CAD status post anterior STEMI  with BMS to LAD and OM in 2012 Reports no anginal complaints.  Had a Myoview in 2013 that showed no reversible defect suggesting ischemia.  Not on aspirin due to Coumadin.  Continue statin.  Chronic mural thrombus Initially seen on echocardiogram in 2015 and also in 2023.  Coumadin per pharmacy. BB held. Will hold for anticipation of procedure tomorrow.   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  He was loaded on amiodarone starting 02/01/2023.  Can  transition to amiodarone to 200 mg daily instead of BID.   Hyperlipidemia Continue atorvastatin 80 mg daily  CKD Baseline to be around 1.3-1.7.  On admission 1.59.  Currently 2.06.  Some creatinine elevation is expected and reasonable as long as he has improvement in symptoms.  Afib? (On his chart but I dont see evidence of this)    Risk Assessment/Risk Scores:    New York Heart Association (NYHA) Functional Class NYHA Class III        For questions or updates, please contact [facility] Please consult [email or web address] for contact info under    Signed, Abagail Kitchens, PA-C  02/27/2023 1:41 PM

## 2023-02-27 NOTE — Plan of Care (Signed)
  Problem: Education: Goal: Knowledge of General Education information will improve Description: Including pain rating scale, medication(s)/side effects and non-pharmacologic comfort measures Outcome: Progressing   Problem: Health Behavior/Discharge Planning: Goal: Ability to manage health-related needs will improve Outcome: Progressing   Problem: Clinical Measurements: Goal: Ability to maintain clinical measurements within normal limits will improve Outcome: Progressing Goal: Will remain free from infection Outcome: Progressing Goal: Diagnostic test results will improve Outcome: Progressing Goal: Respiratory complications will improve Outcome: Progressing Goal: Cardiovascular complication will be avoided Outcome: Progressing   Problem: Activity: Goal: Risk for activity intolerance will decrease Outcome: Progressing   Problem: Nutrition: Goal: Adequate nutrition will be maintained Outcome: Progressing   Problem: Coping: Goal: Level of anxiety will decrease Outcome: Progressing   Problem: Elimination: Goal: Will not experience complications related to bowel motility Outcome: Progressing Goal: Will not experience complications related to urinary retention Outcome: Progressing   Problem: Pain Managment: Goal: General experience of comfort will improve Outcome: Progressing   Problem: Safety: Goal: Ability to remain free from injury will improve Outcome: Progressing   Problem: Skin Integrity: Goal: Risk for impaired skin integrity will decrease Outcome: Progressing   Problem: Education: Goal: Understanding of CV disease, CV risk reduction, and recovery process will improve Outcome: Progressing Goal: Individualized Educational Video(s) Outcome: Progressing   Problem: Activity: Goal: Ability to return to baseline activity level will improve Outcome: Progressing   Problem: Cardiovascular: Goal: Ability to achieve and maintain adequate cardiovascular perfusion  will improve Outcome: Progressing Goal: Vascular access site(s) Level 0-1 will be maintained Outcome: Progressing   Problem: Health Behavior/Discharge Planning: Goal: Ability to safely manage health-related needs after discharge will improve Outcome: Progressing   Problem: Education: Goal: Understanding of CV disease, CV risk reduction, and recovery process will improve Outcome: Progressing Goal: Individualized Educational Video(s) Outcome: Progressing   Problem: Activity: Goal: Ability to return to baseline activity level will improve Outcome: Progressing   Problem: Cardiovascular: Goal: Ability to achieve and maintain adequate cardiovascular perfusion will improve Outcome: Progressing Goal: Vascular access site(s) Level 0-1 will be maintained Outcome: Progressing   Problem: Health Behavior/Discharge Planning: Goal: Ability to safely manage health-related needs after discharge will improve Outcome: Progressing   

## 2023-02-27 NOTE — Plan of Care (Signed)
  Problem: Education: Goal: Knowledge of General Education information will improve Description: Including pain rating scale, medication(s)/side effects and non-pharmacologic comfort measures Outcome: Progressing   Problem: Health Behavior/Discharge Planning: Goal: Ability to manage health-related needs will improve Outcome: Progressing   Problem: Clinical Measurements: Goal: Ability to maintain clinical measurements within normal limits will improve Outcome: Progressing Goal: Will remain free from infection Outcome: Progressing Goal: Diagnostic test results will improve Outcome: Progressing Goal: Respiratory complications will improve Outcome: Progressing Goal: Cardiovascular complication will be avoided Outcome: Progressing   Problem: Activity: Goal: Risk for activity intolerance will decrease Outcome: Progressing   Problem: Nutrition: Goal: Adequate nutrition will be maintained Outcome: Progressing   Problem: Pain Managment: Goal: General experience of comfort will improve Outcome: Progressing   Problem: Safety: Goal: Ability to remain free from injury will improve Outcome: Progressing   Problem: Skin Integrity: Goal: Risk for impaired skin integrity will decrease Outcome: Progressing   Problem: Cardiovascular: Goal: Ability to achieve and maintain adequate cardiovascular perfusion will improve Outcome: Progressing Goal: Vascular access site(s) Level 0-1 will be maintained Outcome: Progressing

## 2023-02-28 ENCOUNTER — Encounter (HOSPITAL_COMMUNITY)
Admission: EM | Disposition: A | Discharge status: Rehabilitation Facility | Payer: Self-pay | Source: Home / Self Care | Attending: Cardiology

## 2023-02-28 DIAGNOSIS — I5043 Acute on chronic combined systolic (congestive) and diastolic (congestive) heart failure: Secondary | ICD-10-CM

## 2023-02-28 DIAGNOSIS — N1831 Chronic kidney disease, stage 3a: Secondary | ICD-10-CM | POA: Diagnosis not present

## 2023-02-28 DIAGNOSIS — N179 Acute kidney failure, unspecified: Secondary | ICD-10-CM

## 2023-02-28 DIAGNOSIS — I5023 Acute on chronic systolic (congestive) heart failure: Secondary | ICD-10-CM | POA: Diagnosis not present

## 2023-02-28 DIAGNOSIS — I472 Ventricular tachycardia, unspecified: Secondary | ICD-10-CM | POA: Diagnosis not present

## 2023-02-28 HISTORY — PX: RIGHT HEART CATH: CATH118263

## 2023-02-28 LAB — COMPREHENSIVE METABOLIC PANEL
ALT: 24 U/L (ref 0–44)
AST: 24 U/L (ref 15–41)
Albumin: 3.2 g/dL — ABNORMAL LOW (ref 3.5–5.0)
Alkaline Phosphatase: 60 U/L (ref 38–126)
Anion gap: 11 (ref 5–15)
BUN: 36 mg/dL — ABNORMAL HIGH (ref 8–23)
CO2: 22 mmol/L (ref 22–32)
Calcium: 8.7 mg/dL — ABNORMAL LOW (ref 8.9–10.3)
Chloride: 99 mmol/L (ref 98–111)
Creatinine, Ser: 1.93 mg/dL — ABNORMAL HIGH (ref 0.61–1.24)
GFR, Estimated: 37 mL/min — ABNORMAL LOW (ref >60)
Glucose, Bld: 101 mg/dL — ABNORMAL HIGH (ref 70–99)
Potassium: 3.6 mmol/L (ref 3.5–5.1)
Sodium: 132 mmol/L — ABNORMAL LOW (ref 135–145)
Total Bilirubin: 2.1 mg/dL — ABNORMAL HIGH (ref 0.3–1.2)
Total Protein: 6.7 g/dL (ref 6.5–8.1)

## 2023-02-28 LAB — POCT I-STAT EG7
Acid-Base Excess: 0 mmol/L (ref 0.0–2.0)
Acid-base deficit: 1 mmol/L (ref 0.0–2.0)
Bicarbonate: 24.3 mmol/L (ref 20.0–28.0)
Bicarbonate: 23.8 mmol/L (ref 20.0–28.0)
Calcium, Ion: 1.18 mmol/L (ref 1.15–1.40)
Calcium, Ion: 1.19 mmol/L (ref 1.15–1.40)
HCT: 42 % (ref 39.0–52.0)
HCT: 42 % (ref 39.0–52.0)
Hemoglobin: 14.3 g/dL (ref 13.0–17.0)
Hemoglobin: 14.3 g/dL (ref 13.0–17.0)
O2 Saturation: 68 %
O2 Saturation: 67 %
Potassium: 4 mmol/L (ref 3.5–5.1)
Potassium: 4 mmol/L (ref 3.5–5.1)
Sodium: 137 mmol/L (ref 135–145)
Sodium: 137 mmol/L (ref 135–145)
TCO2: 25 mmol/L (ref 22–32)
TCO2: 25 mmol/L (ref 22–32)
pCO2, Ven: 38 mmHg — ABNORMAL LOW (ref 44–60)
pCO2, Ven: 38.5 mmHg — ABNORMAL LOW (ref 44–60)
pH, Ven: 7.414 (ref 7.25–7.43)
pH, Ven: 7.399 (ref 7.25–7.43)
pO2, Ven: 35 mmHg (ref 32–45)
pO2, Ven: 35 mmHg (ref 32–45)

## 2023-02-28 LAB — PROTIME-INR
INR: 2.5 — ABNORMAL HIGH (ref 0.8–1.2)
Prothrombin Time: 26.9 s — ABNORMAL HIGH (ref 11.4–15.2)

## 2023-02-28 SURGERY — RIGHT HEART CATH

## 2023-02-28 MED ORDER — FUROSEMIDE 10 MG/ML IJ SOLN
80.0000 mg | Freq: Once | INTRAMUSCULAR | Status: AC
  Administered 2023-02-28: 80 mg via INTRAVENOUS
  Filled 2023-02-28: qty 8

## 2023-02-28 MED ORDER — HEPARIN (PORCINE) IN NACL 1000-0.9 UT/500ML-% IV SOLN
INTRAVENOUS | Status: DC | PRN
  Administered 2023-02-28: 500 mL

## 2023-02-28 MED ORDER — LIDOCAINE HCL (PF) 1 % IJ SOLN
INTRAMUSCULAR | Status: DC | PRN
  Administered 2023-02-28: 5 mL via INTRADERMAL

## 2023-02-28 MED ORDER — LIDOCAINE HCL (PF) 1 % IJ SOLN
INTRAMUSCULAR | Status: AC
  Filled 2023-02-28: qty 30

## 2023-02-28 MED ORDER — MILRINONE LACTATE IN DEXTROSE 20-5 MG/100ML-% IV SOLN
0.1250 ug/kg/min | INTRAVENOUS | Status: DC
  Administered 2023-02-28 – 2023-03-08 (×6): 0.125 ug/kg/min via INTRAVENOUS
  Filled 2023-02-28 (×7): qty 100

## 2023-02-28 MED ORDER — SILDENAFIL CITRATE 20 MG PO TABS: 20.0000 mg | ORAL_TABLET | Freq: Three times a day (TID) | ORAL | Status: DC

## 2023-02-28 SURGICAL SUPPLY — 6 items
CATH BALLN WEDGE 5F 110CM (CATHETERS) IMPLANT
GUIDEWIRE .025 260CM (WIRE) IMPLANT
PACK CARDIAC CATHETERIZATION (CUSTOM PROCEDURE TRAY) IMPLANT
SHEATH GLIDE SLENDER 4/5FR (SHEATH) IMPLANT
TRANSDUCER W/STOPCOCK (MISCELLANEOUS) IMPLANT
TUBING ART PRESS 72 MALE/FEM (TUBING) IMPLANT

## 2023-02-28 NOTE — Progress Notes (Signed)
Heart Failure Navigator Progress Note  Assessed for Heart & Vascular TOC clinic readiness.  Patient does not meet criteria due to Advanced Heart Failure Team getting consulted.   Navigator will sign off at this time.    Dallon Dacosta,RN, BSN,MSN Heart Failure Nurse Navigator. Contact by secure chat only.

## 2023-02-28 NOTE — Interval H&P Note (Signed)
History and Physical Interval Note:  02/28/2023 1:06 PM  Steven [name]  has presented today for surgery, with the diagnosis of heart failure.  The various methods of treatment have been discussed with the patient and family. After consideration of risks, benefits and other options for treatment, the patient has consented to  Procedure(s): RIGHT HEART CATH (N/A) as a surgical intervention.  The patient's history has been reviewed, patient examined, no change in status, stable for surgery.  I have reviewed the patient's chart and labs.  Questions were answered to the patient's satisfaction.     [name]

## 2023-02-28 NOTE — H&P (View-Only) (Signed)
Cardiology Progress Note  Patient ID: Steven [name] MRN: [id number] DOB: Nov 07, 1955 Date of Encounter: 02/28/2023  Primary Cardiologist: Nicki Guadalajara, MD  Subjective   Chief Complaint: None.   HPI: Good urine output.  Plan for right heart catheterization today.  Blood pressure still soft.  ROS:  All other ROS reviewed and negative. Pertinent positives noted in the HPI.     Inpatient Medications  Scheduled Meds:  amiodarone  200 mg Oral Daily   atorvastatin  80 mg Oral Daily   spironolactone  12.5 mg Oral Daily   tamsulosin  0.4 mg Oral QPC supper   Continuous Infusions:  sodium chloride 10 mL/hr at 02/28/23 0550   PRN Meds: acetaminophen **OR** acetaminophen, ondansetron **OR** ondansetron (ZOFRAN) IV, mouth rinse   Vital Signs   Vitals:   02/27/23 2049 02/28/23 0009 02/28/23 0544 02/28/23 0909  BP: 99/65  (!) 88/57 100/72  Pulse: 68  61   Resp: 17 18 17 16   Temp: 98.4 F (36.9 C) 98.3 F (36.8 C) 98 F (36.7 C) 97.9 F (36.6 C)  TempSrc: Oral Oral Oral Oral  SpO2: 98% 100% 100% 99%  Weight:   69.3 kg   Height:        Intake/Output Summary (Last 24 hours) at 02/28/2023 0946 Last data filed at 02/28/2023 0919 Gross per 24 hour  Intake 1.51 ml  Output 1970 ml  Net -1968.49 ml      02/28/2023    5:44 AM 02/27/2023    4:42 AM 02/26/2023    2:37 PM  Last 3 Weights  Weight (lbs) 152 lb 12.5 oz 158 lb 8.2 oz 158 lb 4.6 oz  Weight (kg) 69.3 kg 71.9 kg 71.8 kg      Telemetry  Overnight telemetry shows a paced 60s, which I personally reviewed.    Physical Exam   Vitals:   02/27/23 2049 02/28/23 0009 02/28/23 0544 02/28/23 0909  BP: 99/65  (!) 88/57 100/72  Pulse: 68  61   Resp: 17 18 17 16   Temp: 98.4 F (36.9 C) 98.3 F (36.8 C) 98 F (36.7 C) 97.9 F (36.6 C)  TempSrc: Oral Oral Oral Oral  SpO2: 98% 100% 100% 99%  Weight:   69.3 kg   Height:        Intake/Output Summary (Last 24 hours) at 02/28/2023 0946 Last data filed at 02/28/2023 0919 Gross per 24  hour  Intake 1.51 ml  Output 1970 ml  Net -1968.49 ml       02/28/2023    5:44 AM 02/27/2023    4:42 AM 02/26/2023    2:37 PM  Last 3 Weights  Weight (lbs) 152 lb 12.5 oz 158 lb 8.2 oz 158 lb 4.6 oz  Weight (kg) 69.3 kg 71.9 kg 71.8 kg    Body mass index is 22.56 kg/m.  General: Well nourished, well developed, in no acute distress Head: Atraumatic, normal size  Eyes: PEERLA, EOMI  Neck: Supple, no JVD Endocrine: No thryomegaly Cardiac: Normal S1, S2; RRR; no murmurs, rubs, or gallops Lungs: Clear to auscultation bilaterally, no wheezing, rhonchi or rales  Abd: Soft, nontender, no hepatomegaly  Ext: No edema, pulses 2+ Musculoskeletal: No deformities, BUE and BLE strength normal and equal Skin: Warm and dry, no rashes   Neuro: Alert and oriented to person, place, time, and situation, CNII-XII grossly intact, no focal deficits  Psych: Normal mood and affect   Labs  High Sensitivity Troponin:   Recent Labs  Lab 02/01/23 1312 02/01/23 1447  02/26/23 0231 02/26/23 0410  TROPONINIHS 19* 17 7 13      Cardiac EnzymesNo results for input(s): "TROPONINI" in the last 168 hours. No results for input(s): "TROPIPOC" in the last 168 hours.  Chemistry Recent Labs  Lab 02/26/23 0410 02/27/23 0416 02/28/23 0325  NA 135 135 132*  K 4.0 4.0 3.6  CL 108 105 99  CO2 18* 22 22  GLUCOSE 166* 127* 101*  BUN 38* 34* 36*  CREATININE 1.89* 2.06* 1.93*  CALCIUM 8.0* 8.5* 8.7*  PROT 6.0*  --  6.7  ALBUMIN 3.0*  --  3.2*  AST 70*  --  24  ALT 46*  --  24  ALKPHOS 73  --  60  BILITOT 0.8  --  2.1*  GFRNONAA 38* 34* 37*  ANIONGAP 9 8 11     Hematology Recent Labs  Lab 02/26/23 0231 02/26/23 0253 02/27/23 0416  WBC 9.9  --  10.3  RBC 5.17  --  4.49  HGB 16.3 17.3* 13.8  HCT 50.5 51.0 41.9  MCV 97.7  --  93.3  MCH 31.5  --  30.7  MCHC 32.3  --  32.9  RDW 14.6  --  14.3  PLT 225  --  138*   BNP Recent Labs  Lab 02/26/23 0231  BNP 1,060.3*    DDimer No results for input(s):  "DDIMER" in the last 168 hours.   Radiology  ECHOCARDIOGRAM COMPLETE  Result Date: 02/27/2023    ECHOCARDIOGRAM REPORT   Patient Name:   Steven [name] [facility] Date of Exam: 02/27/2023 Medical Rec #:  [id number]    Height:       69.0 in Accession #:    [id number]   Weight:       158.5 lb Date of Birth:  1956-03-10    BSA:          1.872 m Patient Age:    67 years     BP:           96/65 mmHg Patient Gender: M            HR:           65 bpm. Exam Location:  Inpatient Procedure: 2D Echo, Cardiac Doppler, Color Doppler and Intracardiac            Opacification Agent Indications:    CHF  History:        Patient has prior history of Echocardiogram examinations, most                 recent 05/09/2022. CHF, CAD, Defibrillator; Risk                 Factors:Hypertension.  Sonographer:    Darlys Gales Referring Phys: [id number] MAURICIO DANIEL ARRIEN IMPRESSIONS  1. There is no left ventricular thrombus (Definity was used). . Left ventricular ejection fraction, by estimation, is <20%. The left ventricle has severely decreased function. The left ventricle demonstrates global hypokinesis. The left ventricular internal cavity size was moderately dilated. Left ventricular diastolic parameters are consistent with Grade II diastolic dysfunction (pseudonormalization). Elevated left atrial pressure. There is akinesis of the left ventricular, entire anteroseptal wall, anterior wall and apical segment.  2. Right ventricular systolic function is mildly reduced. The right ventricular size is normal. There is moderately elevated pulmonary artery systolic pressure. The estimated right ventricular systolic pressure is 55.4 mmHg.  3. Left atrial size was moderately dilated.  4. The mitral valve is normal in structure. Mild mitral valve regurgitation.  5. The aortic valve is tricuspid. There is mild thickening of the aortic valve. Aortic valve regurgitation is not visualized. Aortic valve sclerosis is present, with no evidence of aortic valve stenosis.   6. The inferior vena cava is dilated in size with <50% respiratory variability, suggesting right atrial pressure of 15 mmHg. FINDINGS  Left Ventricle: There is no left ventricular thrombus (Definity was used). Left ventricular ejection fraction, by estimation, is <20%. The left ventricle has severely decreased function. The left ventricle demonstrates global hypokinesis. Definity contrast agent was given IV to delineate the left ventricular endocardial borders. The left ventricular internal cavity size was moderately dilated. There is no left ventricular hypertrophy. Left ventricular diastolic parameters are consistent with Grade  II diastolic dysfunction (pseudonormalization). Elevated left atrial pressure.  LV Wall Scoring: The mid and distal anterior wall, entire anterior septum, entire apex, and mid inferoseptal segment are akinetic. The antero-lateral wall, inferior wall, posterior wall, basal anterior segment, and basal inferoseptal segment are hypokinetic. There is no left ventricular thrombus (Definity was used). Right Ventricle: The right ventricular size is normal. No increase in right ventricular wall thickness. Right ventricular systolic function is mildly reduced. There is moderately elevated pulmonary artery systolic pressure. The tricuspid regurgitant velocity is 3.18 m/s, and with an assumed right atrial pressure of 15 mmHg, the estimated right ventricular systolic pressure is 55.4 mmHg. Left Atrium: Left atrial size was moderately dilated. Right Atrium: Right atrial size was normal in size. Pericardium: There is no evidence of pericardial effusion. Mitral Valve: The mitral valve is normal in structure. Mild mitral valve regurgitation, with centrally-directed jet. Tricuspid Valve: The tricuspid valve is normal in structure. Tricuspid valve regurgitation is trivial. Aortic Valve: The aortic valve is tricuspid. There is mild thickening of the aortic valve. Aortic valve regurgitation is not visualized.  Aortic valve sclerosis is present, with no evidence of aortic valve stenosis. Aortic valve mean gradient measures 3.0  mmHg. Aortic valve peak gradient measures 4.4 mmHg. Pulmonic Valve: The pulmonic valve was grossly normal. Pulmonic valve regurgitation is trivial. No evidence of pulmonic stenosis. Aorta: The aortic root is normal in size and structure. Venous: The inferior vena cava is dilated in size with less than 50% respiratory variability, suggesting right atrial pressure of 15 mmHg. IAS/Shunts: No atrial level shunt detected by color flow Doppler. Additional Comments: A is visualized in the right ventricle and right atrium.  LEFT VENTRICLE PLAX 2D LVIDd:         6.10 cm Diastology LVIDs:         5.80 cm LV e' medial:    3.92 cm/s LV PW:         0.80 cm LV E/e' medial:  22.8 LV IVS:        0.90 cm LV e' lateral:   8.49 cm/s                        LV E/e' lateral: 10.5  RIGHT VENTRICLE RV S prime:     9.68 cm/s TAPSE (M-mode): 1.1 cm LEFT ATRIUM             Index        RIGHT ATRIUM           Index LA Vol (A2C):   60.8 ml 32.49 ml/m  RA Area:     17.10 cm LA Vol (A4C):   73.3 ml 39.16 ml/m  RA Volume:   43.80 ml  23.40 ml/m LA Biplane Vol:  66.6 ml 35.58 ml/m  AORTIC VALVE AV Vmax:           105.00 cm/s AV Vmean:          78.000 cm/s AV VTI:            0.202 m AV Peak Grad:      4.4 mmHg AV Mean Grad:      3.0 mmHg LVOT Vmax:         82.20 cm/s LVOT Vmean:        58.600 cm/s LVOT VTI:          0.174 m LVOT/AV VTI ratio: 0.86  AORTA Ao Root diam: 3.20 cm MITRAL VALVE               TRICUSPID VALVE MV Area (PHT): 4.00 cm    TR Peak grad:   40.4 mmHg MV E velocity: 89.50 cm/s  TR Vmax:        318.00 cm/s MV A velocity: 31.30 cm/s MV E/A ratio:  2.86        SHUNTS                            Systemic VTI: 0.17 m Rachelle Hora Croitoru MD Electronically signed by Thurmon Fair MD Signature Date/Time: 02/27/2023/4:29:18 PM    Final     Cardiac Studies  TTE 02/27/2023  1. There is no left ventricular thrombus (Definity  was used). . Left  ventricular ejection fraction, by estimation, is <20%. The left ventricle  has severely decreased function. The left ventricle demonstrates global  hypokinesis. The left ventricular  internal cavity size was moderately dilated. Left ventricular diastolic  parameters are consistent with Grade II diastolic dysfunction  (pseudonormalization). Elevated left atrial pressure. There is akinesis of  the left ventricular, entire anteroseptal  wall, anterior wall and apical segment.   2. Right ventricular systolic function is mildly reduced. The right  ventricular size is normal. There is moderately elevated pulmonary artery  systolic pressure. The estimated right ventricular systolic pressure is  55.4 mmHg.   3. Left atrial size was moderately dilated.   4. The mitral valve is normal in structure. Mild mitral valve  regurgitation.   5. The aortic valve is tricuspid. There is mild thickening of the aortic  valve. Aortic valve regurgitation is not visualized. Aortic valve  sclerosis is present, with no evidence of aortic valve stenosis.   6. The inferior vena cava is dilated in size with <50% respiratory  variability, suggesting right atrial pressure of 15 mmHg.   Patient Profile  67 year old male with history of anterior STEMI in 2012, ischemic cardiomyopathy, LV thrombus on Coumadin, ventricular tachycardia, hyperlipidemia, previous diabetes, CKD stage III admitted on 02/26/2023 for acute on chronic systolic heart failure.   Assessment & Plan   # Acute on chronic systolic heart failure, EF less than 20% # Ischemic cardiomyopathy -Blood pressure soft.  Good diuresis.  Plan for right heart catheterization today.  Echo suggest he is still volume overloaded.  Examination and consistent with this. -Net -6.7 L since admission.  Hold further diuresis till cath. -Holding beta-blocker.  Not a good candidate for digoxin given CKD.  Add back SGLT2 inhibitor after procedure.  Continue  Aldactone 12.5 mg daily.  May need advanced heart failure evaluation.  Informed Consent   Shared Decision Making/Informed Consent The risks [stroke (1 in 1000), death (1 in 1000), kidney failure [usually temporary] (1 in 500), bleeding (1 in  200), allergic reaction [possibly serious] (1 in 200)], benefits (diagnostic support and management of coronary artery disease) and alternatives of a cardiac catheterization were discussed in detail with Mr. Chirino and he is willing to proceed.     # CAD -History of anterior.  No symptoms of angina. -Not on aspirin as he is on Coumadin at home.  Continue high intensity statin.  # History of ventricular tachycardia status post ICD -Continue amiodarone 200 mg daily.  # LV apical thrombus -On Coumadin at home.  On hold.  Right heart catheterization today.       For questions or updates, please contact [facility] Please consult [email or web address] for contact info under        Signed, Gerri Spore T. Flora Lipps, MD, [facility]   [facility]  02/28/2023 9:46 AM

## 2023-02-28 NOTE — Consult Note (Addendum)
Advanced Heart Failure Team Consult Note   Primary Physician: Girtha Rm Eli Phillips, MD PCP-Cardiologist:  Nicki Guadalajara, MD  Reason for Consultation: Acute on chronic combined systolic and diastolic heart failure  HPI:    Steven [name] is seen today for evaluation of acute on chronic combines systolic and diastolic heart failure at the request of Dr. Flora Lipps.   [name] is a 67 y.o. 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   Originally from [place], suffered a large out of hospital anterior wall myocardial infarction January 2012.  He presented after a three-hour delay to the hospital and was taken emergently for cath with DES to LAD. Followed by Dr Tresa Endo and Dr Royann Shivers for many years.    Admitted 4/24 with a/c CHF. He was diuresed from 177>>154 lb, then transitioned to PO torsemide, 40 mg daily.   He was seen in HF [facility] clinic twice earlier this year but was not interested in advanced therapies at the time.   02/01/23 Seen in ED for syncope. Device interrogation showed VT, treated with ICD shock.  [name] presented to the ED Monday after developing SOB that started Saturday and got worst Sunday night. He has been feeling winded while at work and gets SOB with ambulation. Reports compliance with meds, watches what he eats and is mindful of his fluid intake. Sleeps on 2 pillows at home. Was taking his diuretic every other day. 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 On admission labs reviewed: BNP 1K, HsTrop 7>13, K 4, SCr 1.89, AST 70, ALT 46, CXR with edema. Has been diuresed with IV lasix, overall diuresed about 7L, weight down 6lbs. RHC today (02/28/23) RA 5, PW 26/37, mean 19 with prominent V waves. PA 44/13 (27), PA sat 67%, CO 3.95. CI 2.15. AHF consulted for possible advanced therapies. Having difficulty titrating GDMT with soft BPs.  Discussed advanced  therapies today. Still hesitant but willing to learn more about LVAD. Daughter lives in [place] and would be able to help at home. Denies substance abuse or tobacco use. Reports occasional ETOH on the weekends. Since being admitted and being diuresed he feels better. Is able to walk in the halls without so much SOB.   Cardiac studied reviewed RHC today (02/28/23) RA 5, PW 26/37, mean 19 with prominent V waves. PA 44/13 (27), PA sat 67%, CO 3.95. CI 2.15. Normal RA pressure, mild pulmonary hypertension, mildly elevated wedge pressure and moderately reduced cardiac output/index.  Echo 02/27/23: EF <20%, LV with GHK, RV mildly reduced, GIIDD, LA mod dilated, mild MR 04/2022 Echo EF 20-25% RV moderately reduced, , mural thrombos.   2021 LVEF 20-25% RV normal Cath 2012. DES to LAD   Home Medications Prior to Admission medications   Medication Sig Start Date End Date Taking? Authorizing Provider  acetaminophen (TYLENOL) 500 MG tablet Take 1,000 mg by mouth every 6 (six) hours as needed for mild pain.   Yes [provider]  amiodarone (PACERONE) 200 MG tablet Take 2 tablets (400 mg total) by mouth 2 (two) times daily for 14 days, THEN 1 tablet (200 mg total) 2 (two) times daily for 14 days. 02/23/23 03/23/23 Yes Sheilah Pigeon, PA-C  atorvastatin (LIPITOR) 80 MG tablet Take 1 tablet (80 mg total) by mouth daily. 01/01/23  Yes Robbie Lis M, PA-C  Cholecalciferol (VITAMIN D) 125 MCG (5000  UT) CAPS Take 5,000 Units by mouth daily.   Yes [provider]  dapagliflozin propanediol (FARXIGA) 10 MG TABS tablet Take 1 tablet (10 mg total) by mouth daily. 11/21/22  Yes Robbie Lis M, PA-C  metoprolol succinate (TOPROL-XL) 25 MG 24 hr tablet Take 1 tablet (25 mg total) by mouth daily. 01/11/23  Yes Lennette Bihari, MD  Multiple Vitamin (MULTIVITAMIN) tablet Take 1 tablet by mouth daily.   Yes [provider]  nitroGLYCERIN (NITROSTAT) 0.4 MG SL tablet Place 1 tablet (0.4 mg total)  under the tongue every 5 (five) minutes as needed. Usual dose is q 5 minutes x 3 doses. 07/27/21  Yes Lennette Bihari, MD  potassium chloride SA (KLOR-CON M) 20 MEQ tablet Take 1 tablet (20 mEq total) by mouth daily. 01/11/23  Yes Lennette Bihari, MD  sacubitril-valsartan (ENTRESTO) 24-26 MG Take 1 tablet by mouth 2 (two) times daily. 11/21/22  Yes Robbie Lis M, PA-C  spironolactone (ALDACTONE) 25 MG tablet Take 0.5 tablets (12.5 mg total) by mouth every other day. TAKE EOD ON EVEN DAYS 04/14/22  Yes Lennette Bihari, MD  tamsulosin (FLOMAX) 0.4 MG CAPS capsule Take 0.4 mg by mouth in the morning and at bedtime.   Yes [provider]  torsemide (DEMADEX) 20 MG tablet Take 1 tablet by mouth every other day. 12/22/22  Yes Clegg, Amy D, NP  warfarin (COUMADIN) 5 MG tablet Take 1 tablet daily except 1/2 tablet on Wednesdays and Fridays or as directed. Patient taking differently: Take 2.5-5 mg by mouth See admin instructions. Take 1 tablet by mouth every Tue and Thur, then take 1/2 tablet all other days 08/14/22  Yes Lennette Bihari, MD  ZENPEP [phone] units CPEP Take 1 capsule by mouth 3 (three) times daily with meals. 08/09/22  Yes [provider]  amiodarone (PACERONE) 200 MG tablet Take 1 tablet (200 mg) by mouth daily. 03/04/23   Sheilah Pigeon, PA-C    Past Medical History: Past Medical History:  Diagnosis Date   Acute MI, anterior wall (HCC)    AICD (automatic cardioverter/defibrillator) present    CAD (coronary artery disease)    2D ECHO, 07/13/2011 - EF <25%, LV moderatelty dilated, LA moderately dilatedLEXISCAN, 12/14/2011 - moderate-severe perfusion defect seen in the basal anteroseptal, mid anterior, apicacl anterior, apical, apical inferior, and apical lateral regions, post-stress EF 25%, new EKG changes from baseline abnormalities   Cancer [facility])    Prostate   CHF (congestive heart failure) (HCC) 2012   Hypertension 08/08/2021   Inguinal hernia, left    Pneumonia     November 2023   Pre-diabetes     Past Surgical History: Past Surgical History:  Procedure Laterality Date   CARDIAC CATHETERIZATION  11/25/2010   Predilation balloon-Apex monorail 2x45mm, Cutting balloon-2.25x32mm, resulting in a reduction of 100% stenosis down to less than 10%   CARDIAC CATHETERIZATION  07/12/2010   LAD stented with a 3.5x84mm bare-metal Veriflex stent resulting in a reduction of 100% lesion to 0%   CARDIAC DEFIBRILLATOR PLACEMENT  03/02/2011   Medtronic Protecta XT DR, model #O130QMV, serial #[id number]   COLONOSCOPY     GOLD SEED IMPLANT N/A 07/21/2021   Procedure: GOLD SEED IMPLANT;  Surgeon: Heloise Purpura, MD;  Location: [facility] ORS;  Service: Urology;  Laterality: N/A;  NEEDS 30 MIN   HERNIA REPAIR  2018   ICD GENERATOR CHANGEOUT N/A 02/24/2019   Procedure: ICD GENERATOR CHANGEOUT;  Surgeon: Thurmon Fair, MD;  [facility] INVASIVE CV  LAB;  Service: Cardiovascular;  Laterality: N/A;   PACEMAKER PLACEMENT  07/14/2010   Temporary placement of pacemaker, if rhythm issue continues will need a permanent device   SPACE OAR INSTILLATION N/A 07/21/2021   Procedure: SPACE OAR INSTILLATION;  Surgeon: Heloise Purpura, MD;  Location: [facility] ORS;  Service: Urology;  Laterality: N/A;    Family History: History reviewed. No pertinent family history.  Social History: Social History   Socioeconomic History   Marital status: Single    Spouse name: Not on file   Number of children: 1   Years of education: Not on file   Highest education level: Bachelor's degree (e.g., BA, AB, BS)  Occupational History   Occupation: works on Systems analyst  Tobacco Use   Smoking status: Former    Current packs/day: 0.00    Types: Cigarettes, Cigars    Quit date: 2012    Years since quitting: 12.6   Smokeless tobacco: Never  Vaping Use   Vaping status: Never Used  Substance and Sexual Activity   Alcohol use: Yes    Alcohol/week: 2.0 - 3.0 standard drinks of alcohol    Types: 2 - 3 drink(s)  per week   Drug use: No   Sexual activity: Not Currently  Other Topics Concern   Not on file  Social History Narrative   Not on file   Social Determinants of Health   Financial Resource Strain: Low Risk  (05/08/2022)   Overall Financial Resource Strain (CARDIA)    Difficulty of Paying Living Expenses: Not hard at all  Food Insecurity: No Food Insecurity (02/26/2023)   Hunger Vital Sign    Worried About Running Out of Food in the Last Year: Never true    Ran Out of Food in the Last Year: Never true  Transportation Needs: No Transportation Needs (02/26/2023)   PRAPARE - Administrator, Civil Service (Medical): No    Lack of Transportation (Non-Medical): No  Physical Activity: Not on file  Stress: Not on file  Social Connections: Unknown (11/08/2021)   Received from [facility]   Social Network    Social Network: Not on file   Allergies:  No Known Allergies  Objective:    Vital Signs:   Temp:  [97.8 F (36.6 C)-98.4 F (36.9 C)] 98.1 F (36.7 C) (09/04 1345) Pulse Rate:  [60-91] 91 (09/04 1329) Resp:  [12-28] 18 (09/04 1345) BP: (88-114)/(57-72) 97/68 (09/04 1415) SpO2:  [98 %-100 %] 100 % (09/04 1415) Weight:  [69.3 kg] 69.3 kg (09/04 0544) Last BM Date :  (PTA)  Weight change: Filed Weights   02/26/23 1437 02/27/23 0442 02/28/23 0544  Weight: 71.8 kg 71.9 kg 69.3 kg   Intake/Output:   Intake/Output Summary (Last 24 hours) at 02/28/2023 1417 Last data filed at 02/28/2023 1238 Gross per 24 hour  Intake 1.51 ml  Output 2270 ml  Net -2268.49 ml    Physical Exam  General:  well appearing.  No respiratory difficulty HEENT: normal Neck: supple. JVD ~6 cm. Carotids 2+ bilat; no bruits. No lymphadenopathy or thyromegaly appreciated. Cor: PMI nondisplaced. Regular rate & rhythm. No rubs, gallops or murmurs. Lungs: clear Abdomen: soft, nontender, nondistended. No hepatosplenomegaly. No bruits or masses. Good bowel sounds. Extremities: no cyanosis, clubbing,  rash, edema  Neuro: alert & oriented x 3, cranial nerves grossly intact. moves all 4 extremities w/o difficulty. Affect pleasant.   Telemetry   Paced 60s (Personally reviewed)    EKG    NSR 70s  Labs  Basic Metabolic Panel: Recent Labs  Lab 02/26/23 0253 02/26/23 0410 02/27/23 0416 02/28/23 0325  NA 135 135 135 132*  K 8.3* 4.0 4.0 3.6  CL  --  108 105 99  CO2  --  18* 22 22  GLUCOSE  --  166* 127* 101*  BUN  --  38* 34* 36*  CREATININE  --  1.89* 2.06* 1.93*  CALCIUM  --  8.0* 8.5* 8.7*   Liver Function Tests: Recent Labs  Lab 02/26/23 0410 02/28/23 0325  AST 70* 24  ALT 46* 24  ALKPHOS 73 60  BILITOT 0.8 2.1*  PROT 6.0* 6.7  ALBUMIN 3.0* 3.2*   No results for input(s): "LIPASE", "AMYLASE" in the last 168 hours. No results for input(s): "AMMONIA" in the last 168 hours.  CBC: Recent Labs  Lab 02/26/23 0231 02/26/23 0253 02/27/23 0416  WBC 9.9  --  10.3  NEUTROABS 2.8  --   --   HGB 16.3 17.3* 13.8  HCT 50.5 51.0 41.9  MCV 97.7  --  93.3  PLT 225  --  138*   Cardiac Enzymes: No results for input(s): "CKTOTAL", "CKMB", "CKMBINDEX", "TROPONINI" in the last 168 hours.  BNP: BNP (last 3 results) Recent Labs    10/15/22 1110 11/21/22 1122 02/26/23 0231  BNP 2,147.0* 914.3* 1,060.3*   ProBNP (last 3 results) Recent Labs    04/18/22 0958  PROBNP 587*   CBG: No results for input(s): "GLUCAP" in the last 168 hours.  Coagulation Studies: Recent Labs    02/26/23 0231 02/27/23 0416 02/28/23 0325  LABPROT 29.5* 30.5* 26.9*  INR 2.8* 2.9* 2.5*   Imaging   CARDIAC CATHETERIZATION  Result Date: 02/28/2023 Successful right heart catheterization via the right antecubital vein. Normal RA pressure, mild pulmonary hypertension, mildly elevated wedge pressure and moderately reduced cardiac output/index. RA: 5 mmHg RV 43/3 with an RVEDP of 8 mmHg PW: 26/37 with a mean of 19 mmHg.  Prominent V wave. PA: 44/13 with a mean of 27 mmHg. PA sat is 67% with  cardiac output of 3.95 and cardiac index of 2.15. 2.  Severe right-sided pericardial calcifications of unclear etiology.  No evidence of constriction by hemodynamic evaluation. Recommendations: Recommend gentle diuresis. Anticoagulation can be resumed. Consider advanced heart failure consult.   ECHOCARDIOGRAM COMPLETE  Result Date: 02/27/2023    ECHOCARDIOGRAM REPORT   Patient Name:   Steven [name] [facility] Date of Exam: 02/27/2023 Medical Rec #:  [id number]    Height:       69.0 in Accession #:    [id number]   Weight:       158.5 lb Date of Birth:  [birth year]    BSA:          1.872 m Patient Age:    67 years     BP:           96/65 mmHg Patient Gender: M            HR:           65 bpm. Exam Location:  Inpatient Procedure: 2D Echo, Cardiac Doppler, Color Doppler and Intracardiac            Opacification Agent Indications:    CHF  History:        Patient has prior history of Echocardiogram examinations, most                 recent 05/09/2022. CHF, CAD, Defibrillator; Risk  Factors:Hypertension.  Sonographer:    Darlys Gales Referring Phys: [id number] MAURICIO Dequavius Kuhner ARRIEN IMPRESSIONS  1. There is no left ventricular thrombus (Definity was used). . Left ventricular ejection fraction, by estimation, is <20%. The left ventricle has severely decreased function. The left ventricle demonstrates global hypokinesis. The left ventricular internal cavity size was moderately dilated. Left ventricular diastolic parameters are consistent with Grade II diastolic dysfunction (pseudonormalization). Elevated left atrial pressure. There is akinesis of the left ventricular, entire anteroseptal wall, anterior wall and apical segment.  2. Right ventricular systolic function is mildly reduced. The right ventricular size is normal. There is moderately elevated pulmonary artery systolic pressure. The estimated right ventricular systolic pressure is 55.4 mmHg.  3. Left atrial size was moderately dilated.  4. The mitral valve is normal in  structure. Mild mitral valve regurgitation.  5. The aortic valve is tricuspid. There is mild thickening of the aortic valve. Aortic valve regurgitation is not visualized. Aortic valve sclerosis is present, with no evidence of aortic valve stenosis.  6. The inferior vena cava is dilated in size with <50% respiratory variability, suggesting right atrial pressure of 15 mmHg. FINDINGS  Left Ventricle: There is no left ventricular thrombus (Definity was used). Left ventricular ejection fraction, by estimation, is <20%. The left ventricle has severely decreased function. The left ventricle demonstrates global hypokinesis. Definity contrast agent was given IV to delineate the left ventricular endocardial borders. The left ventricular internal cavity size was moderately dilated. There is no left ventricular hypertrophy. Left ventricular diastolic parameters are consistent with Grade  II diastolic dysfunction (pseudonormalization). Elevated left atrial pressure.  LV Wall Scoring: The mid and distal anterior wall, entire anterior septum, entire apex, and mid inferoseptal segment are akinetic. The antero-lateral wall, inferior wall, posterior wall, basal anterior segment, and basal inferoseptal segment are hypokinetic. There is no left ventricular thrombus (Definity was used). Right Ventricle: The right ventricular size is normal. No increase in right ventricular wall thickness. Right ventricular systolic function is mildly reduced. There is moderately elevated pulmonary artery systolic pressure. The tricuspid regurgitant velocity is 3.18 m/s, and with an assumed right atrial pressure of 15 mmHg, the estimated right ventricular systolic pressure is 55.4 mmHg. Left Atrium: Left atrial size was moderately dilated. Right Atrium: Right atrial size was normal in size. Pericardium: There is no evidence of pericardial effusion. Mitral Valve: The mitral valve is normal in structure. Mild mitral valve regurgitation, with  centrally-directed jet. Tricuspid Valve: The tricuspid valve is normal in structure. Tricuspid valve regurgitation is trivial. Aortic Valve: The aortic valve is tricuspid. There is mild thickening of the aortic valve. Aortic valve regurgitation is not visualized. Aortic valve sclerosis is present, with no evidence of aortic valve stenosis. Aortic valve mean gradient measures 3.0  mmHg. Aortic valve peak gradient measures 4.4 mmHg. Pulmonic Valve: The pulmonic valve was grossly normal. Pulmonic valve regurgitation is trivial. No evidence of pulmonic stenosis. Aorta: The aortic root is normal in size and structure. Venous: The inferior vena cava is dilated in size with less than 50% respiratory variability, suggesting right atrial pressure of 15 mmHg. IAS/Shunts: No atrial level shunt detected by color flow Doppler. Additional Comments: A is visualized in the right ventricle and right atrium.  LEFT VENTRICLE PLAX 2D LVIDd:         6.10 cm Diastology LVIDs:         5.80 cm LV e' medial:    3.92 cm/s LV PW:         0.80  cm LV E/e' medial:  22.8 LV IVS:        0.90 cm LV e' lateral:   8.49 cm/s                        LV E/e' lateral: 10.5  RIGHT VENTRICLE RV S prime:     9.68 cm/s TAPSE (M-mode): 1.1 cm LEFT ATRIUM             Index        RIGHT ATRIUM           Index LA Vol (A2C):   60.8 ml 32.49 ml/m  RA Area:     17.10 cm LA Vol (A4C):   73.3 ml 39.16 ml/m  RA Volume:   43.80 ml  23.40 ml/m LA Biplane Vol: 66.6 ml 35.58 ml/m  AORTIC VALVE AV Vmax:           105.00 cm/s AV Vmean:          78.000 cm/s AV VTI:            0.202 m AV Peak Grad:      4.4 mmHg AV Mean Grad:      3.0 mmHg LVOT Vmax:         82.20 cm/s LVOT Vmean:        58.600 cm/s LVOT VTI:          0.174 m LVOT/AV VTI ratio: 0.86  AORTA Ao Root diam: 3.20 cm MITRAL VALVE               TRICUSPID VALVE MV Area (PHT): 4.00 cm    TR Peak grad:   40.4 mmHg MV E velocity: 89.50 cm/s  TR Vmax:        318.00 cm/s MV A velocity: 31.30 cm/s MV E/A ratio:  2.86         SHUNTS                            Systemic VTI: 0.17 m Mihai Croitoru MD Electronically signed by Thurmon Fair MD Signature Date/Time: 02/27/2023/4:29:18 PM    Final      Medications:   Current Medications:  amiodarone  200 mg Oral Daily   atorvastatin  80 mg Oral Daily   furosemide  80 mg Intravenous Once   spironolactone  12.5 mg Oral Daily   tamsulosin  0.4 mg Oral QPC supper    Infusions:   Patient Profile   Steven [name] is a 67 y.o. 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 AHF team to see with A/C combined systolic and diastolic heart failure and inability to tolerate GDMT 2/2 hypotension. Work up for advanced therapies.   Assessment/Plan  Acute on chronic combined systolic and diastolic heart failure - EF has been down for many years, due to ICM, H/o large anterior MI due to LAD infarct  - Echo 02/27/23: EF <20%, LV with GHK, RV mildly reduced, GIIDD, LA mod dilated, mild MR - RHC today (02/28/23) RA 5, PW 26/37, mean 19 with prominent V waves. PA 44/13 (27), PA sat 67%, CO 3.95. CI 2.15. Normal RA pressure, mild pulmonary hypertension, mildly elevated wedge pressure and moderately reduced cardiac output/index.  - NYHA IV on admission - Volume overloaded on admission, overall diuresed ~7L. Got 80 IV  lasix x1 today. Hold off on further diuresis.Suspect we can transition to PO tomorrow. - GDMT limited by hypotension - Start Milrinone 0.125 mcg/kg/min to help with renal recovery - Continue spiro 12.5 mg daily - Previously on Farxiga, Entresto, toprol XL, spiro and Torsemide. Plan to add back GDMT as BP and renal function allows  - cMRI to view calcification of pericardium - Start official work up for advanced therapies. LVAD coordinators aware.   CAD - h/o large anterior MI 2013 treated w/ DES to LAD  - on statin + ? blocker - no ASA given  chronic coumadin - denies CP, HsTrop flat  AKI on CKD IIIa - Scr up to 2 this admission, baseline ~1.2 - 1.9 today - Start inotropic support to help renal function improve  Mural thrombus - On coumadin, plan to restart today  H/o VT s/p Medtronic ICD - likely scar mediated - Continue amiodarone 200 mg daily - s/p MDT ICD, last event 8/24 - Keep K>4, Mg >2   Length of Stay: 2  Alen Bleacher, NP  02/28/2023, 2:17 PM  Advanced Heart Failure Team Pager [phone] (M-F; 7a - 5p)  Please contact [facility] for night-coverage after hours (4p -7a ) and weekends on [email or web address]  Patient seen and examined with the above-signed Advanced Practice Provider and/or Housestaff. I personally reviewed laboratory data, imaging studies and relevant notes. I independently examined the patient and formulated the important aspects of the plan. I have edited the note to reflect any of my changes or salient points. I have personally discussed the plan with the patient and/or family.  67 y/o male with CAD s/p anterior Mi in 2012 with ischemic CM. Recent syncope with VT. Admitted with fatigue and volume overload  Echo EF < 20% RV mildly reduced. RHC today with low output. CI 2.1. Calcified pericardium (no H/o TB)  SCr typically ~1.5 now 1.9  General Thin No resp difficulty HEENT: normal Neck: supple. JVP 8-9 Carotids 2+ bilat; no bruits. No lymphadenopathy or thryomegaly appreciated. Cor: PMI nondisplaced. Regular rate & rhythm. 2/6 MR +s3 Lungs: clear Abdomen: soft, nontender, nondistended. No hepatosplenomegaly. No bruits or masses. Good bowel sounds. Extremities: no cyanosis, clubbing, rash, edema Neuro: alert & orientedx3, cranial nerves grossly intact. moves all 4 extremities w/o difficulty. Affect pleasant  He has severe iCM with Class IV symptoms and low output with intolerance of GDMT and recent VT with syncope. Will need w/u for advanced therapies (VAD likely better than transplant given age).  VAD team to see tomorrow. Will need cMRI to better evaluate pericardial calcifications.   Arvilla Meres, MD  5:17 PM   Will start milrinone

## 2023-02-28 NOTE — Progress Notes (Signed)
Cardiology Progress Note  Patient ID: TYMOTHY COTTON MRN: [id number] DOB: Nov 07, 1955 Date of Encounter: 02/28/2023  Primary Cardiologist: Nicki Guadalajara, MD  Subjective   Chief Complaint: None.   HPI: Good urine output.  Plan for right heart catheterization today.  Blood pressure still soft.  ROS:  All other ROS reviewed and negative. Pertinent positives noted in the HPI.     Inpatient Medications  Scheduled Meds:  amiodarone  200 mg Oral Daily   atorvastatin  80 mg Oral Daily   spironolactone  12.5 mg Oral Daily   tamsulosin  0.4 mg Oral QPC supper   Continuous Infusions:  sodium chloride 10 mL/hr at 02/28/23 0550   PRN Meds: acetaminophen **OR** acetaminophen, ondansetron **OR** ondansetron (ZOFRAN) IV, mouth rinse   Vital Signs   Vitals:   02/27/23 2049 02/28/23 0009 02/28/23 0544 02/28/23 0909  BP: 99/65  (!) 88/57 100/72  Pulse: 68  61   Resp: 17 18 17 16   Temp: 98.4 F (36.9 C) 98.3 F (36.8 C) 98 F (36.7 C) 97.9 F (36.6 C)  TempSrc: Oral Oral Oral Oral  SpO2: 98% 100% 100% 99%  Weight:   69.3 kg   Height:        Intake/Output Summary (Last 24 hours) at 02/28/2023 0946 Last data filed at 02/28/2023 0919 Gross per 24 hour  Intake 1.51 ml  Output 1970 ml  Net -1968.49 ml      02/28/2023    5:44 AM 02/27/2023    4:42 AM 02/26/2023    2:37 PM  Last 3 Weights  Weight (lbs) 152 lb 12.5 oz 158 lb 8.2 oz 158 lb 4.6 oz  Weight (kg) 69.3 kg 71.9 kg 71.8 kg      Telemetry  Overnight telemetry shows a paced 60s, which I personally reviewed.    Physical Exam   Vitals:   02/27/23 2049 02/28/23 0009 02/28/23 0544 02/28/23 0909  BP: 99/65  (!) 88/57 100/72  Pulse: 68  61   Resp: 17 18 17 16   Temp: 98.4 F (36.9 C) 98.3 F (36.8 C) 98 F (36.7 C) 97.9 F (36.6 C)  TempSrc: Oral Oral Oral Oral  SpO2: 98% 100% 100% 99%  Weight:   69.3 kg   Height:        Intake/Output Summary (Last 24 hours) at 02/28/2023 0946 Last data filed at 02/28/2023 0919 Gross per 24  hour  Intake 1.51 ml  Output 1970 ml  Net -1968.49 ml       02/28/2023    5:44 AM 02/27/2023    4:42 AM 02/26/2023    2:37 PM  Last 3 Weights  Weight (lbs) 152 lb 12.5 oz 158 lb 8.2 oz 158 lb 4.6 oz  Weight (kg) 69.3 kg 71.9 kg 71.8 kg    Body mass index is 22.56 kg/m.  General: Well nourished, well developed, in no acute distress Head: Atraumatic, normal size  Eyes: PEERLA, EOMI  Neck: Supple, no JVD Endocrine: No thryomegaly Cardiac: Normal S1, S2; RRR; no murmurs, rubs, or gallops Lungs: Clear to auscultation bilaterally, no wheezing, rhonchi or rales  Abd: Soft, nontender, no hepatomegaly  Ext: No edema, pulses 2+ Musculoskeletal: No deformities, BUE and BLE strength normal and equal Skin: Warm and dry, no rashes   Neuro: Alert and oriented to person, place, time, and situation, CNII-XII grossly intact, no focal deficits  Psych: Normal mood and affect   Labs  High Sensitivity Troponin:   Recent Labs  Lab 02/01/23 1312 02/01/23 1447  02/26/23 0231 02/26/23 0410  TROPONINIHS 19* 17 7 13      Cardiac EnzymesNo results for input(s): "TROPONINI" in the last 168 hours. No results for input(s): "TROPIPOC" in the last 168 hours.  Chemistry Recent Labs  Lab 02/26/23 0410 02/27/23 0416 02/28/23 0325  NA 135 135 132*  K 4.0 4.0 3.6  CL 108 105 99  CO2 18* 22 22  GLUCOSE 166* 127* 101*  BUN 38* 34* 36*  CREATININE 1.89* 2.06* 1.93*  CALCIUM 8.0* 8.5* 8.7*  PROT 6.0*  --  6.7  ALBUMIN 3.0*  --  3.2*  AST 70*  --  24  ALT 46*  --  24  ALKPHOS 73  --  60  BILITOT 0.8  --  2.1*  GFRNONAA 38* 34* 37*  ANIONGAP 9 8 11     Hematology Recent Labs  Lab 02/26/23 0231 02/26/23 0253 02/27/23 0416  WBC 9.9  --  10.3  RBC 5.17  --  4.49  HGB 16.3 17.3* 13.8  HCT 50.5 51.0 41.9  MCV 97.7  --  93.3  MCH 31.5  --  30.7  MCHC 32.3  --  32.9  RDW 14.6  --  14.3  PLT 225  --  138*   BNP Recent Labs  Lab 02/26/23 0231  BNP 1,060.3*    DDimer No results for input(s):  "DDIMER" in the last 168 hours.   Radiology  ECHOCARDIOGRAM COMPLETE  Result Date: 02/27/2023    ECHOCARDIOGRAM REPORT   Patient Name:   KELLAM KROCKER [facility] Date of Exam: 02/27/2023 Medical Rec #:  [id number]    Height:       69.0 in Accession #:    [id number]   Weight:       158.5 lb Date of Birth:  1956-03-10    BSA:          1.872 m Patient Age:    67 years     BP:           96/65 mmHg Patient Gender: M            HR:           65 bpm. Exam Location:  Inpatient Procedure: 2D Echo, Cardiac Doppler, Color Doppler and Intracardiac            Opacification Agent Indications:    CHF  History:        Patient has prior history of Echocardiogram examinations, most                 recent 05/09/2022. CHF, CAD, Defibrillator; Risk                 Factors:Hypertension.  Sonographer:    Darlys Gales Referring Phys: [id number] MAURICIO DANIEL ARRIEN IMPRESSIONS  1. There is no left ventricular thrombus (Definity was used). . Left ventricular ejection fraction, by estimation, is <20%. The left ventricle has severely decreased function. The left ventricle demonstrates global hypokinesis. The left ventricular internal cavity size was moderately dilated. Left ventricular diastolic parameters are consistent with Grade II diastolic dysfunction (pseudonormalization). Elevated left atrial pressure. There is akinesis of the left ventricular, entire anteroseptal wall, anterior wall and apical segment.  2. Right ventricular systolic function is mildly reduced. The right ventricular size is normal. There is moderately elevated pulmonary artery systolic pressure. The estimated right ventricular systolic pressure is 55.4 mmHg.  3. Left atrial size was moderately dilated.  4. The mitral valve is normal in structure. Mild mitral valve regurgitation.  5. The aortic valve is tricuspid. There is mild thickening of the aortic valve. Aortic valve regurgitation is not visualized. Aortic valve sclerosis is present, with no evidence of aortic valve stenosis.   6. The inferior vena cava is dilated in size with <50% respiratory variability, suggesting right atrial pressure of 15 mmHg. FINDINGS  Left Ventricle: There is no left ventricular thrombus (Definity was used). Left ventricular ejection fraction, by estimation, is <20%. The left ventricle has severely decreased function. The left ventricle demonstrates global hypokinesis. Definity contrast agent was given IV to delineate the left ventricular endocardial borders. The left ventricular internal cavity size was moderately dilated. There is no left ventricular hypertrophy. Left ventricular diastolic parameters are consistent with Grade  II diastolic dysfunction (pseudonormalization). Elevated left atrial pressure.  LV Wall Scoring: The mid and distal anterior wall, entire anterior septum, entire apex, and mid inferoseptal segment are akinetic. The antero-lateral wall, inferior wall, posterior wall, basal anterior segment, and basal inferoseptal segment are hypokinetic. There is no left ventricular thrombus (Definity was used). Right Ventricle: The right ventricular size is normal. No increase in right ventricular wall thickness. Right ventricular systolic function is mildly reduced. There is moderately elevated pulmonary artery systolic pressure. The tricuspid regurgitant velocity is 3.18 m/s, and with an assumed right atrial pressure of 15 mmHg, the estimated right ventricular systolic pressure is 55.4 mmHg. Left Atrium: Left atrial size was moderately dilated. Right Atrium: Right atrial size was normal in size. Pericardium: There is no evidence of pericardial effusion. Mitral Valve: The mitral valve is normal in structure. Mild mitral valve regurgitation, with centrally-directed jet. Tricuspid Valve: The tricuspid valve is normal in structure. Tricuspid valve regurgitation is trivial. Aortic Valve: The aortic valve is tricuspid. There is mild thickening of the aortic valve. Aortic valve regurgitation is not visualized.  Aortic valve sclerosis is present, with no evidence of aortic valve stenosis. Aortic valve mean gradient measures 3.0  mmHg. Aortic valve peak gradient measures 4.4 mmHg. Pulmonic Valve: The pulmonic valve was grossly normal. Pulmonic valve regurgitation is trivial. No evidence of pulmonic stenosis. Aorta: The aortic root is normal in size and structure. Venous: The inferior vena cava is dilated in size with less than 50% respiratory variability, suggesting right atrial pressure of 15 mmHg. IAS/Shunts: No atrial level shunt detected by color flow Doppler. Additional Comments: A is visualized in the right ventricle and right atrium.  LEFT VENTRICLE PLAX 2D LVIDd:         6.10 cm Diastology LVIDs:         5.80 cm LV e' medial:    3.92 cm/s LV PW:         0.80 cm LV E/e' medial:  22.8 LV IVS:        0.90 cm LV e' lateral:   8.49 cm/s                        LV E/e' lateral: 10.5  RIGHT VENTRICLE RV S prime:     9.68 cm/s TAPSE (M-mode): 1.1 cm LEFT ATRIUM             Index        RIGHT ATRIUM           Index LA Vol (A2C):   60.8 ml 32.49 ml/m  RA Area:     17.10 cm LA Vol (A4C):   73.3 ml 39.16 ml/m  RA Volume:   43.80 ml  23.40 ml/m LA Biplane Vol:  66.6 ml 35.58 ml/m  AORTIC VALVE AV Vmax:           105.00 cm/s AV Vmean:          78.000 cm/s AV VTI:            0.202 m AV Peak Grad:      4.4 mmHg AV Mean Grad:      3.0 mmHg LVOT Vmax:         82.20 cm/s LVOT Vmean:        58.600 cm/s LVOT VTI:          0.174 m LVOT/AV VTI ratio: 0.86  AORTA Ao Root diam: 3.20 cm MITRAL VALVE               TRICUSPID VALVE MV Area (PHT): 4.00 cm    TR Peak grad:   40.4 mmHg MV E velocity: 89.50 cm/s  TR Vmax:        318.00 cm/s MV A velocity: 31.30 cm/s MV E/A ratio:  2.86        SHUNTS                            Systemic VTI: 0.17 m Rachelle Hora Croitoru MD Electronically signed by Thurmon Fair MD Signature Date/Time: 02/27/2023/4:29:18 PM    Final     Cardiac Studies  TTE 02/27/2023  1. There is no left ventricular thrombus (Definity  was used). . Left  ventricular ejection fraction, by estimation, is <20%. The left ventricle  has severely decreased function. The left ventricle demonstrates global  hypokinesis. The left ventricular  internal cavity size was moderately dilated. Left ventricular diastolic  parameters are consistent with Grade II diastolic dysfunction  (pseudonormalization). Elevated left atrial pressure. There is akinesis of  the left ventricular, entire anteroseptal  wall, anterior wall and apical segment.   2. Right ventricular systolic function is mildly reduced. The right  ventricular size is normal. There is moderately elevated pulmonary artery  systolic pressure. The estimated right ventricular systolic pressure is  55.4 mmHg.   3. Left atrial size was moderately dilated.   4. The mitral valve is normal in structure. Mild mitral valve  regurgitation.   5. The aortic valve is tricuspid. There is mild thickening of the aortic  valve. Aortic valve regurgitation is not visualized. Aortic valve  sclerosis is present, with no evidence of aortic valve stenosis.   6. The inferior vena cava is dilated in size with <50% respiratory  variability, suggesting right atrial pressure of 15 mmHg.   Patient Profile  67 year old male with history of anterior STEMI in 2012, ischemic cardiomyopathy, LV thrombus on Coumadin, ventricular tachycardia, hyperlipidemia, previous diabetes, CKD stage III admitted on 02/26/2023 for acute on chronic systolic heart failure.   Assessment & Plan   # Acute on chronic systolic heart failure, EF less than 20% # Ischemic cardiomyopathy -Blood pressure soft.  Good diuresis.  Plan for right heart catheterization today.  Echo suggest he is still volume overloaded.  Examination and consistent with this. -Net -6.7 L since admission.  Hold further diuresis till cath. -Holding beta-blocker.  Not a good candidate for digoxin given CKD.  Add back SGLT2 inhibitor after procedure.  Continue  Aldactone 12.5 mg daily.  May need advanced heart failure evaluation.  Informed Consent   Shared Decision Making/Informed Consent The risks [stroke (1 in 1000), death (1 in 1000), kidney failure [usually temporary] (1 in 500), bleeding (1 in  200), allergic reaction [possibly serious] (1 in 200)], benefits (diagnostic support and management of coronary artery disease) and alternatives of a cardiac catheterization were discussed in detail with Mr. Chirino and he is willing to proceed.     # CAD -History of anterior.  No symptoms of angina. -Not on aspirin as he is on Coumadin at home.  Continue high intensity statin.  # History of ventricular tachycardia status post ICD -Continue amiodarone 200 mg daily.  # LV apical thrombus -On Coumadin at home.  On hold.  Right heart catheterization today.       For questions or updates, please contact [facility] Please consult [email or web address] for contact info under        Signed, Gerri Spore T. Flora Lipps, MD, [facility]   [facility]  02/28/2023 9:46 AM

## 2023-02-28 NOTE — Progress Notes (Signed)
PROGRESS NOTE    Steven [name]  ZOX:[id number] DOB: 1955-11-22 DOA: 02/26/2023 PCP: [name], MD    Brief Narrative:   Steven [name] is a 67 y.o. male with past medical history significant for chronic systolic congestive heart failure (LVEF <20%), ischemic cardiomyopathy, history of LV thrombus, history of ventricular tachycardia s/p ICD, CKD stage IIIb, hyperlipidemia, hypertension who presented to [facility] ED on 9/2 from home via EMS with acute onset shortness of breath.  Also associated with nausea.  Dyspnea worse with exertion.  Denies lower extremity edema but endorses orthopnea/PND.  Patient was recently seen in cardiology outpatient clinic, his torsemide was decreased to 20 mg every other day due to decreased GFR.  Patient reports compliance with his medications and adherence to a salt/fluid restricted diet.  Patient denies chest pain, no abdominal pain. On EMS arrival patient was noted to be tachypneic with elevated JVD and SpO2 78% on room air.  Patient was given nitroglycerin, placed on CPAP and transported to the ED for further evaluation.  In the ED, temperature 96.8 F, HR 85, RR 31, BP 127/66, SpO2 92% on BiPAP.  VBG with pH 7.280, pCO2 40.5, pO2 112.  WBC 9.9, hemoglobin 16.3, platelet count 225.  Sodium 135, potassium 4.0, chloride 108, CO2 18, glucose 166, BUN 38, creatinine 1.89.  AST 70, ALT 46, total bilirubin 0.8.  High sensitive troponin 7 followed by 13.  BNP 1060.3.  SARS Cov 2 PCR negative.  Chest x-ray with severe diffuse bilateral airspace disease worsening on the right and improving on left, reflective of edema versus infection.  Assessment & Plan:    Acute hypoxic/hypercapnic respiratory failure, POA Acute on chronic systolic congestive heart failure Pulmonary edema Patient presenting with acute onset shortness of breath 2 hours prior to arrival to the ED.  Patient was found tachypneic and hypoxic with SpO2 78% on room air.  Not oxygen dependent at  baseline.  Recently had dose reduction of his torsemide outpatient due to worsening renal function.  Patient states he hears to salt/fluid restricted diet.  Patient was on BiPAP on arrival with a VBG with elevated pCO2 of 40.5.  Chest x-ray with findings consistent with pulmonary edema.  COVID PCR negative. TTE with LVEF less than 20%, grade 2 diastolic dysfunction, previous LV thrombus not visualized, IVC dilated, RVSP 55.4 mmHg. -- Cardiology consulted -- Net negative 2.6L past 24h and net negative 6.4L since admission -- wt 71.8>71.9>69.3kg -- Spironolactone 12.5 mg p.o. every other day -- Farxiga 10 mg p.o. daily -- Holding home metoprolol succinate and Entresto due to borderline hypotension -- 1500 mL fluid restriction -- Strict I's and O's and daily weights -- BMP daily -- N.p.o., plan right heart catheterization today  Transaminitis: Resolved LFTs elevated, likely secondary to hepatic congestion in the setting of CHF exacerbation as above. -- AST 70>24 -- ALT 46>24 -- CMP daily  History of LV thrombus Repeat TTE shows no LV thrombus. -- Continue Coumadin, pharmacy consulted for dosing/monitoring -- INR daily; 2.5 today  History of ventricular tachycardia s/p ICD -- Amiodarone 200 mg p.o. twice daily -- Continue to monitor on telemetry  Paroxysmal atrial fibrillation -- Continue Coumadin -- Holding home metoprolol succinate for borderline hypotension  AKI on CKD stage IIIb Baseline creatinine 1.3-1.7.  Creatinine 1.89 on admission, trended up to 2.06.  Etiology likely secondary to volume overload versus ATN with poor perfusion due to low output cardiomyopathy. -- Cr 1.89>2.06>1.93 -- BMP daily  CAD Ischemic cardiomyopathy --  Continue statin  Essential hypertension -- Spironolactone 12.5 mg p.o. every other day -- Holding home metoprolol succinate, Entresto as above  Hyperlipidemia --Atorvastatin 80 mg p.o. daily  Malignant neoplasm of prostate -- Continue  outpatient follow-up with oncology  DVT prophylaxis: SCDs Start: 02/26/23 1502    Code Status: Full Code Family Communication: No family present at bedside this morning  Disposition Plan:  Level of care: Telemetry Cardiac Status is: Inpatient Remains inpatient appropriate because: IV diuresis    Consultants:  Cardiology  Procedures:  TTE: Pending  Antimicrobials:  None   Subjective: Patient seen examined at bedside, lying in bed.  Sleeping but easy arousable.  Reports dyspnea improved.  Awaiting right heart catheterization later today.  Reports robust urine output with IV Lasix yesterday.   No other specific complaints, questions or concerns at this time.  Denies headache, no visual changes, no chest pain, no palpitations, no nausea/vomit/diarrhea, no abdominal pain, no fever/chills/night sweats, no fatigue, no focal weakness, no paresthesias.  No acute events overnight per nursing staff.  Objective: Vitals:   02/27/23 2049 02/28/23 0009 02/28/23 0544 02/28/23 0909  BP: 99/65  (!) 88/57 100/72  Pulse: 68  61   Resp: 17 18 17 16   Temp: 98.4 F (36.9 C) 98.3 F (36.8 C) 98 F (36.7 C) 97.9 F (36.6 C)  TempSrc: Oral Oral Oral Oral  SpO2: 98% 100% 100% 99%  Weight:   69.3 kg   Height:        Intake/Output Summary (Last 24 hours) at 02/28/2023 0944 Last data filed at 02/28/2023 0919 Gross per 24 hour  Intake 1.51 ml  Output 1970 ml  Net -1968.49 ml   Filed Weights   02/26/23 1437 02/27/23 0442 02/28/23 0544  Weight: 71.8 kg 71.9 kg 69.3 kg    Examination:  Physical Exam: GEN: NAD, alert and oriented x 3, chronically ill in appearance, appears older than stated age HEENT: NCAT, PERRL, EOMI, sclera clear, MMM PULM: Diminished breath sounds bilateral bases with crackles, no wheezing, normal respiratory effort without accessory muscle use, on 3 L nasal cannula with SpO2 97% at rest CV: RRR w/o M/G/R GI: abd soft, NTND, NABS, no R/G/M MSK: no peripheral edema, moves  all extremities independently with preserved strength NEURO: CN II-XII intact, no focal deficits, sensation to light touch intact PSYCH: normal mood/affect Integumentary: dry/intact, no rashes or wounds    Data Reviewed: I have personally reviewed following labs and imaging studies  CBC: Recent Labs  Lab 02/26/23 0231 02/26/23 0253 02/27/23 0416  WBC 9.9  --  10.3  NEUTROABS 2.8  --   --   HGB 16.3 17.3* 13.8  HCT 50.5 51.0 41.9  MCV 97.7  --  93.3  PLT 225  --  138*   Basic Metabolic Panel: Recent Labs  Lab 02/26/23 0253 02/26/23 0410 02/27/23 0416 02/28/23 0325  NA 135 135 135 132*  K 8.3* 4.0 4.0 3.6  CL  --  108 105 99  CO2  --  18* 22 22  GLUCOSE  --  166* 127* 101*  BUN  --  38* 34* 36*  CREATININE  --  1.89* 2.06* 1.93*  CALCIUM  --  8.0* 8.5* 8.7*   GFR: Estimated Creatinine Clearance: 35.9 mL/min (A) (by C-G formula based on SCr of 1.93 mg/dL (H)). Liver Function Tests: Recent Labs  Lab 02/26/23 0410 02/28/23 0325  AST 70* 24  ALT 46* 24  ALKPHOS 73 60  BILITOT 0.8 2.1*  PROT 6.0* 6.7  ALBUMIN 3.0* 3.2*   No results for input(s): "LIPASE", "AMYLASE" in the last 168 hours. No results for input(s): "AMMONIA" in the last 168 hours. Coagulation Profile: Recent Labs  Lab 02/26/23 0231 02/27/23 0416 02/28/23 0325  INR 2.8* 2.9* 2.5*   Cardiac Enzymes: No results for input(s): "CKTOTAL", "CKMB", "CKMBINDEX", "TROPONINI" in the last 168 hours. BNP (last 3 results) Recent Labs    04/18/22 0958  PROBNP 587*   HbA1C: No results for input(s): "HGBA1C" in the last 72 hours. CBG: No results for input(s): "GLUCAP" in the last 168 hours. Lipid Profile: No results for input(s): "CHOL", "HDL", "LDLCALC", "TRIG", "CHOLHDL", "LDLDIRECT" in the last 72 hours. Thyroid Function Tests: No results for input(s): "TSH", "T4TOTAL", "FREET4", "T3FREE", "THYROIDAB" in the last 72 hours. Anemia Panel: No results for input(s): "VITAMINB12", "FOLATE",  "FERRITIN", "TIBC", "IRON", "RETICCTPCT" in the last 72 hours. Sepsis Labs: Recent Labs  Lab 02/26/23 1215 02/26/23 1615  LATICACIDVEN 1.7 1.9    Recent Results (from the past 240 hour(s))  SARS Coronavirus 2 by RT PCR (hospital order, performed in [facility] hospital lab) *cepheid single result test* Anterior Nasal Swab     Status: None   Collection Time: 02/26/23  2:25 AM   Specimen: Anterior Nasal Swab  Result Value Ref Range Status   SARS Coronavirus 2 by RT PCR NEGATIVE NEGATIVE Final    Comment: Performed at [facility], [address] [place], [place] [address]         Radiology Studies: ECHOCARDIOGRAM COMPLETE  Result Date: 02/27/2023    ECHOCARDIOGRAM REPORT   Patient Name:   STILES ALPERS [facility] Date of Exam: 02/27/2023 Medical Rec #:  [id number]    Height:       69.0 in Accession #:    [id number]   Weight:       158.5 lb Date of Birth:  1956/06/03    BSA:          1.872 m Patient Age:    67 years     BP:           96/65 mmHg Patient Gender: M            HR:           65 bpm. Exam Location:  Inpatient Procedure: 2D Echo, Cardiac Doppler, Color Doppler and Intracardiac            Opacification Agent Indications:    CHF  History:        Patient has prior history of Echocardiogram examinations, most                 recent 05/09/2022. CHF, CAD, Defibrillator; Risk                 Factors:Hypertension.  Sonographer:    Darlys Gales Referring Phys: [id number] MAURICIO DANIEL ARRIEN IMPRESSIONS  1. There is no left ventricular thrombus (Definity was used). . Left ventricular ejection fraction, by estimation, is <20%. The left ventricle has severely decreased function. The left ventricle demonstrates global hypokinesis. The left ventricular internal cavity size was moderately dilated. Left ventricular diastolic parameters are consistent with Grade II diastolic dysfunction (pseudonormalization). Elevated left atrial pressure. There is akinesis of the left ventricular, entire anteroseptal wall,  anterior wall and apical segment.  2. Right ventricular systolic function is mildly reduced. The right ventricular size is normal. There is moderately elevated pulmonary artery systolic pressure. The estimated right ventricular systolic pressure is 55.4 mmHg.  3. Left atrial  size was moderately dilated.  4. The mitral valve is normal in structure. Mild mitral valve regurgitation.  5. The aortic valve is tricuspid. There is mild thickening of the aortic valve. Aortic valve regurgitation is not visualized. Aortic valve sclerosis is present, with no evidence of aortic valve stenosis.  6. The inferior vena cava is dilated in size with <50% respiratory variability, suggesting right atrial pressure of 15 mmHg. FINDINGS  Left Ventricle: There is no left ventricular thrombus (Definity was used). Left ventricular ejection fraction, by estimation, is <20%. The left ventricle has severely decreased function. The left ventricle demonstrates global hypokinesis. Definity contrast agent was given IV to delineate the left ventricular endocardial borders. The left ventricular internal cavity size was moderately dilated. There is no left ventricular hypertrophy. Left ventricular diastolic parameters are consistent with Grade  II diastolic dysfunction (pseudonormalization). Elevated left atrial pressure.  LV Wall Scoring: The mid and distal anterior wall, entire anterior septum, entire apex, and mid inferoseptal segment are akinetic. The antero-lateral wall, inferior wall, posterior wall, basal anterior segment, and basal inferoseptal segment are hypokinetic. There is no left ventricular thrombus (Definity was used). Right Ventricle: The right ventricular size is normal. No increase in right ventricular wall thickness. Right ventricular systolic function is mildly reduced. There is moderately elevated pulmonary artery systolic pressure. The tricuspid regurgitant velocity is 3.18 m/s, and with an assumed right atrial pressure of 15  mmHg, the estimated right ventricular systolic pressure is 55.4 mmHg. Left Atrium: Left atrial size was moderately dilated. Right Atrium: Right atrial size was normal in size. Pericardium: There is no evidence of pericardial effusion. Mitral Valve: The mitral valve is normal in structure. Mild mitral valve regurgitation, with centrally-directed jet. Tricuspid Valve: The tricuspid valve is normal in structure. Tricuspid valve regurgitation is trivial. Aortic Valve: The aortic valve is tricuspid. There is mild thickening of the aortic valve. Aortic valve regurgitation is not visualized. Aortic valve sclerosis is present, with no evidence of aortic valve stenosis. Aortic valve mean gradient measures 3.0  mmHg. Aortic valve peak gradient measures 4.4 mmHg. Pulmonic Valve: The pulmonic valve was grossly normal. Pulmonic valve regurgitation is trivial. No evidence of pulmonic stenosis. Aorta: The aortic root is normal in size and structure. Venous: The inferior vena cava is dilated in size with less than 50% respiratory variability, suggesting right atrial pressure of 15 mmHg. IAS/Shunts: No atrial level shunt detected by color flow Doppler. Additional Comments: A is visualized in the right ventricle and right atrium.  LEFT VENTRICLE PLAX 2D LVIDd:         6.10 cm Diastology LVIDs:         5.80 cm LV e' medial:    3.92 cm/s LV PW:         0.80 cm LV E/e' medial:  22.8 LV IVS:        0.90 cm LV e' lateral:   8.49 cm/s                        LV E/e' lateral: 10.5  RIGHT VENTRICLE RV S prime:     9.68 cm/s TAPSE (M-mode): 1.1 cm LEFT ATRIUM             Index        RIGHT ATRIUM           Index LA Vol (A2C):   60.8 ml 32.49 ml/m  RA Area:     17.10 cm LA Vol (A4C):  73.3 ml 39.16 ml/m  RA Volume:   43.80 ml  23.40 ml/m LA Biplane Vol: 66.6 ml 35.58 ml/m  AORTIC VALVE AV Vmax:           105.00 cm/s AV Vmean:          78.000 cm/s AV VTI:            0.202 m AV Peak Grad:      4.4 mmHg AV Mean Grad:      3.0 mmHg LVOT Vmax:          82.20 cm/s LVOT Vmean:        58.600 cm/s LVOT VTI:          0.174 m LVOT/AV VTI ratio: 0.86  AORTA Ao Root diam: 3.20 cm MITRAL VALVE               TRICUSPID VALVE MV Area (PHT): 4.00 cm    TR Peak grad:   40.4 mmHg MV E velocity: 89.50 cm/s  TR Vmax:        318.00 cm/s MV A velocity: 31.30 cm/s MV E/A ratio:  2.86        SHUNTS                            Systemic VTI: 0.17 m Mihai Croitoru MD Electronically signed by Thurmon Fair MD Signature Date/Time: 02/27/2023/4:29:18 PM    Final         Scheduled Meds:  amiodarone  200 mg Oral Daily   atorvastatin  80 mg Oral Daily   spironolactone  12.5 mg Oral Daily   tamsulosin  0.4 mg Oral QPC supper   Continuous Infusions:  sodium chloride 10 mL/hr at 02/28/23 0550     LOS: 2 days    Time spent: 50 minutes spent on chart review, discussion with nursing staff, consultants, updating family and interview/physical exam; more than 50% of that time was spent in counseling and/or coordination of care.    Alvira Philips [place], DO [facility] Available via Epic secure chat 7am-7pm After these hours, please refer to coverage provider listed on [email or web address] 02/28/2023, 9:44 AM

## 2023-02-28 NOTE — Plan of Care (Signed)
  Problem: Education: Goal: Knowledge of General Education information will improve Description: Including pain rating scale, medication(s)/side effects and non-pharmacologic comfort measures Outcome: Progressing   Problem: Clinical Measurements: Goal: Respiratory complications will improve Outcome: Progressing   Problem: Coping: Goal: Level of anxiety will decrease Outcome: Progressing   

## 2023-02-28 NOTE — Progress Notes (Signed)
Remote ICD transmission.   

## 2023-02-28 NOTE — Progress Notes (Signed)
Heart Failure Stewardship Pharmacist Progress Note   PCP: Steven [name], Steven Phillips, MD PCP-Cardiologist: Nicki Guadalajara, MD    HPI:  67 yo M with CHF, CAD, VT s/p ICD, HLD, apical mural thrombus, CKD IIIa, and SDH.   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 He presented after a three-hour delay to the hospital and was taken emergently for cath with DES to LAD.   Followed by Dr Tresa Endo and Dr Royann Shivers for many years.    Admitted in 04/2022 with acute CHF. ECHO showed LVEF <20% and RV moderately reduced. Diuresed with IV lasix and placed on GDMT. Followed up with HF TOC clinic on 05/22/22 and was doing well. Unable to take full dose spironolactone due to chest discomfort. Discussed possible f/u with Advanced Heart Failure and suggested CPX for risk stratification however he wanted to discuss with Dr Tresa Endo.   Last admitted 09/2022 with acute on chronic CHF. BNP 2147. He was diuresed from 177>154 lbs and discharged with torsemide. Referred to HF TOC and discussed referral to AHF clinic if he were to need advanced therapies down the road. Patient declined and was referred back to general cardiology.  Presented to the ED on 9/2 with shortness of breath, PND, and orthopnea. CXR with severe diffuse bilateral airspace disease. ECHO pending.   Patient states he was doing well up to a few hours before his admission. He normally fluctuates 1-2 lbs per day (especially on the days he does not take torsemide). Baseline weight around 154 lbs at home. Reports his weight was 161 lbs the day of admission.   Current HF Medications: MRA: spironolactone 12.5 mg daily  Prior to admission HF Medications: Diuretic: torsemide 20 mg every other day Beta blocker: metoprolol XL 25 mg daily ACE/ARB/ARNI: Entresto 24/26 mg BID MRA: spironolactone 12.5 mg every other day SGLT2i: Farxiga 10 mg daily  Pertinent Lab Values: Serum creatinine 1.93, BUN 36,  Potassium 3.6, Sodium 132, BNP 1060.3  Vital Signs: Weight: 152 lbs (admission weight: 158 lbs) Blood pressure: 90-100/60s  Heart rate: 60s  I/O: net -2.7L yesterday; net -6.8L since admission  Medication Assistance / Insurance Benefits Check: Does the patient have prescription insurance?  Yes Type of insurance plan: [place] Medicaid  Outpatient Pharmacy:  Prior to admission outpatient pharmacy: [facility] Is the patient willing to use [facility] TOC pharmacy at discharge? Yes Is the patient willing to transition their outpatient pharmacy to utilize a [facility] outpatient pharmacy?   Yes    Assessment: 1. Acute on chronic systolic CHF (LVEF <20%), due to ICM. NYHA class III symptoms. - Off IV lasix pending cath today. Strict I/Os and daily weights. Keep K>4 and Mg>2. - Holding PTA metoprolol with decompensated HF and hypotension. Concern for low output. Lactic acid 1.9. R/LHC today - follow up results.  - Also holding PTA Entresto with hypotension - Agree with changing to spironolactone 12.5 mg daily - Holding Farxiga until after cath today - He states he is willing to come back to HF [facility] clinic to discuss advanced therapies further. He is concerned about missing work for possible surgeries or procedures but understands that advanced therapies could improve his quality and length of life. All questions answered.   Plan: 1) Medication changes recommended at this time: - Agree with changes - Pending R/LHC today  2) Patient assistance: - None pending  3)  Education  - Patient has been educated on current HF medications and potential additions  to HF medication regimen - Patient verbalizes understanding that over the next few months, these medication doses may change and more medications may be added to optimize HF regimen - Patient has been educated on basic disease state pathophysiology and goals of therapy   Sharen Hones, PharmD, BCPS Heart Failure Stewardship Pharmacist Phone [phone]

## 2023-03-01 ENCOUNTER — Other Ambulatory Visit: Payer: Self-pay

## 2023-03-01 ENCOUNTER — Inpatient Hospital Stay (HOSPITAL_COMMUNITY): Payer: 59

## 2023-03-01 ENCOUNTER — Encounter (HOSPITAL_COMMUNITY): Payer: Self-pay | Admitting: Cardiovascular Disease

## 2023-03-01 DIAGNOSIS — I509 Heart failure, unspecified: Secondary | ICD-10-CM

## 2023-03-01 DIAGNOSIS — Z7189 Other specified counseling: Secondary | ICD-10-CM

## 2023-03-01 DIAGNOSIS — I5023 Acute on chronic systolic (congestive) heart failure: Secondary | ICD-10-CM | POA: Diagnosis not present

## 2023-03-01 DIAGNOSIS — Z515 Encounter for palliative care: Secondary | ICD-10-CM | POA: Diagnosis not present

## 2023-03-01 DIAGNOSIS — I255 Ischemic cardiomyopathy: Secondary | ICD-10-CM | POA: Diagnosis not present

## 2023-03-01 DIAGNOSIS — I5043 Acute on chronic combined systolic (congestive) and diastolic (congestive) heart failure: Secondary | ICD-10-CM | POA: Diagnosis not present

## 2023-03-01 DIAGNOSIS — J9601 Acute respiratory failure with hypoxia: Secondary | ICD-10-CM | POA: Diagnosis not present

## 2023-03-01 DIAGNOSIS — I482 Chronic atrial fibrillation, unspecified: Secondary | ICD-10-CM | POA: Diagnosis not present

## 2023-03-01 LAB — CBC
HCT: 40.5 % (ref 39.0–52.0)
Hemoglobin: 13.4 g/dL (ref 13.0–17.0)
MCH: 30.9 pg (ref 26.0–34.0)
MCHC: 33.1 g/dL (ref 30.0–36.0)
MCV: 93.3 fL (ref 80.0–100.0)
Platelets: 141 10*3/uL — ABNORMAL LOW (ref 150–400)
RBC: 4.34 MIL/uL (ref 4.22–5.81)
RDW: 14 % (ref 11.5–15.5)
WBC: 9.2 10*3/uL (ref 4.0–10.5)
nRBC: 0 % (ref 0.0–0.2)

## 2023-03-01 LAB — URINALYSIS, ROUTINE W REFLEX MICROSCOPIC
Bacteria, UA: NONE SEEN
Bilirubin Urine: NEGATIVE
Glucose, UA: >=500 mg/dL — AB
Hgb urine dipstick: NEGATIVE
Ketones, ur: NEGATIVE mg/dL
Leukocytes,Ua: NEGATIVE
Nitrite: NEGATIVE
Protein, ur: NEGATIVE mg/dL
Specific Gravity, Urine: 1.016 (ref 1.005–1.030)
pH: 7 (ref 5.0–8.0)

## 2023-03-01 LAB — PULMONARY FUNCTION TEST
DL/VA % pred: 87 %
DL/VA: 3.6 ml/min/mmHg/L
DLCO cor % pred: 67 %
DLCO cor: 17.14 ml/min/mmHg
DLCO unc % pred: 64 %
DLCO unc: 16.53 ml/min/mmHg
FEF 25-75 Post: 3.06 L/s
FEF 25-75 Pre: 1.78 L/s
FEF2575-%Change-Post: 72 %
FEF2575-%Pred-Post: 123 %
FEF2575-%Pred-Pre: 71 %
FEV1-%Change-Post: 13 %
FEV1-%Pred-Post: 78 %
FEV1-%Pred-Pre: 69 %
FEV1-Post: 2.53 L
FEV1-Pre: 2.22 L
FEV1FVC-%Change-Post: 4 %
FEV1FVC-%Pred-Pre: 102 %
FEV6-%Change-Post: 8 %
FEV6-%Pred-Post: 78 %
FEV6-%Pred-Pre: 71 %
FEV6-Post: 3.2 L
FEV6-Pre: 2.94 L
FEV6FVC-%Pred-Post: 105 %
FEV6FVC-%Pred-Pre: 105 %
FVC-%Change-Post: 8 %
FVC-%Pred-Post: 73 %
FVC-%Pred-Pre: 67 %
FVC-Post: 3.2 L
FVC-Pre: 2.94 L
Post FEV1/FVC ratio: 79 %
Post FEV6/FVC ratio: 100 %
Pre FEV1/FVC ratio: 76 %
Pre FEV6/FVC Ratio: 100 %
RV % pred: 80 %
RV: 1.9 L
TLC % pred: 73 %
TLC: 5.02 L

## 2023-03-01 LAB — BASIC METABOLIC PANEL
Anion gap: 12 (ref 5–15)
BUN: 44 mg/dL — ABNORMAL HIGH (ref 8–23)
CO2: 20 mmol/L — ABNORMAL LOW (ref 22–32)
Calcium: 8.3 mg/dL — ABNORMAL LOW (ref 8.9–10.3)
Chloride: 98 mmol/L (ref 98–111)
Creatinine, Ser: 1.95 mg/dL — ABNORMAL HIGH (ref 0.61–1.24)
GFR, Estimated: 37 mL/min — ABNORMAL LOW (ref >60)
Glucose, Bld: 104 mg/dL — ABNORMAL HIGH (ref 70–99)
Potassium: 3.9 mmol/L (ref 3.5–5.1)
Sodium: 130 mmol/L — ABNORMAL LOW (ref 135–145)

## 2023-03-01 LAB — RAPID URINE DRUG SCREEN, HOSP PERFORMED
Amphetamines: NOT DETECTED
Barbiturates: NOT DETECTED
Benzodiazepines: NOT DETECTED
Cocaine: NOT DETECTED
Opiates: NOT DETECTED
Tetrahydrocannabinol: NOT DETECTED

## 2023-03-01 LAB — HEPARIN LEVEL (UNFRACTIONATED): Heparin Unfractionated: 0.35 [IU]/mL (ref 0.30–0.70)

## 2023-03-01 LAB — PROTIME-INR
INR: 1.7 — ABNORMAL HIGH (ref 0.8–1.2)
Prothrombin Time: 20.1 s — ABNORMAL HIGH (ref 11.4–15.2)

## 2023-03-01 LAB — MAGNESIUM: Magnesium: 2.3 mg/dL (ref 1.7–2.4)

## 2023-03-01 MED ORDER — SODIUM CHLORIDE 0.9% FLUSH: 10.0000 mL | INTRAVENOUS | Status: DC | PRN

## 2023-03-01 MED ORDER — HEPARIN (PORCINE) 25000 UT/250ML-% IV SOLN
1200.0000 [IU]/h | INTRAVENOUS | Status: DC
  Administered 2023-03-01 – 2023-03-05 (×6): 1200 [IU]/h via INTRAVENOUS
  Filled 2023-03-01 (×6): qty 250

## 2023-03-01 MED ORDER — WARFARIN SODIUM 5 MG PO TABS: 5.0000 mg | ORAL_TABLET | ORAL | Status: DC

## 2023-03-01 MED ORDER — WARFARIN SODIUM 2.5 MG PO TABS: 2.5000 mg | ORAL_TABLET | ORAL | Status: DC

## 2023-03-01 MED ORDER — HEPARIN BOLUS VIA INFUSION
4300.0000 [IU] | Freq: Once | INTRAVENOUS | Status: AC
  Administered 2023-03-01: 4300 [IU] via INTRAVENOUS
  Filled 2023-03-01: qty 4300

## 2023-03-01 MED ORDER — WARFARIN SODIUM 7.5 MG PO TABS: 7.5000 mg | ORAL_TABLET | Freq: Once | ORAL | Status: DC

## 2023-03-01 MED ORDER — SODIUM CHLORIDE 0.9% FLUSH
10.0000 mL | Freq: Two times a day (BID) | INTRAVENOUS | Status: DC
  Administered 2023-03-01: 10 mL
  Administered 2023-03-02: 20 mL
  Administered 2023-03-03 – 2023-03-05 (×6): 10 mL
  Administered 2023-03-06: 40 mL
  Administered 2023-03-07: 10 mL
  Administered 2023-03-07: 30 mL

## 2023-03-01 MED ORDER — CHLORHEXIDINE GLUCONATE CLOTH 2 % EX PADS
6.0000 | MEDICATED_PAD | Freq: Every day | CUTANEOUS | Status: DC
  Administered 2023-03-01 – 2023-03-07 (×7): 6 via TOPICAL

## 2023-03-01 MED ORDER — WARFARIN - PHARMACIST DOSING INPATIENT: Freq: Every day | Status: DC

## 2023-03-01 MED ORDER — ALBUTEROL SULFATE (2.5 MG/3ML) 0.083% IN NEBU
2.5000 mg | INHALATION_SOLUTION | Freq: Once | RESPIRATORY_TRACT | Status: AC
  Administered 2023-03-01: 2.5 mg via RESPIRATORY_TRACT

## 2023-03-01 MED ORDER — DAPAGLIFLOZIN PROPANEDIOL 10 MG PO TABS
10.0000 mg | ORAL_TABLET | Freq: Every day | ORAL | Status: DC
  Administered 2023-03-01 – 2023-03-03 (×3): 10 mg via ORAL
  Filled 2023-03-01 (×3): qty 1

## 2023-03-01 NOTE — Progress Notes (Signed)
Patient transported to x-ray and CT by transporter via wheelchair with Thurston Hole Engineer, materials) at 1142 on 03/01/23.

## 2023-03-01 NOTE — Progress Notes (Addendum)
[facility] EDUCATION NOTE:                VAD evaluation consent reviewed and signed by patient Steven [name]. Initial VAD teaching completed with pt and caregiver.   VAD educational packet including "Understanding Your Options with Advanced Heart Failure", "[place] Patient Agreement for VAD Evaluation and Potential Implantation" consent, and [employer] "Heartmate 3 Left Ventricular Device (LVAD) Patient Guide", Heartmate 3 Left Ventricular Assist System Patient Education Program DVD", "[place] HM III Patient Education", "[place] Mechanical Circulatory Support Program", and "Decision Aids for Left Ventricular Assist Device" reviewed in detail and left at bedside for continued reference.   All questions answered regarding VAD implant, hospital stay, and what to expect when discharged home living with a heart pump. Patient identified Steven [name] as his primary caregiver if this therapy should be deemed appropriate for patient. Explained need for 24/7 care when pt is discharged home due to sternal precautions, adaptation to living on support, emotional support, consistent and meticulous exit site care and management, medication adherence and high volume of follow up visits with the VAD Clinic after discharge; both pt and caregiver verbalized understanding of above.   Explained that LVAD can be implanted for two indications in the setting of advanced left ventricular heart failure treatment:  Bridge to transplant - used for patients who cannot safely wait for heart transplant without this device.  Or    Destination therapy - used for patients until end of life or recovery of heart function.  Patient acknowledged that the indication at this point in time for LVAD therapy would be for destination therapy due to age.   Provided brief equipment overview and demonstration with HeartMate III training loop including discussion on the following:   a) mobile power unit b) system controller   c) universal Musician   d) battery clips   e) Batteries   f)  Perc lock   g) Percutaneous lead   Demonstrated and discussed:  a) changing power source on system controller from tethered (MPU) to untethered (battery) mode   b) changing power source on system controller from untethered (battery) to tethered (MPU) mode   c) how to monitor battery life both on the system controller and on each individual battery   d) changing batteries   Reviewed and supplied a copy of home inspection check list stressing that only three pronged grounded power outlets can be used for VAD equipment. Patient confirmed home has electrical outlets that will support the equipment along with access working telephone.  Identified the following lifestyle modifications while living on MCS:    1. No driving for at least three months and then only if doctor gives permission to do so.   2. No tub baths while pump implanted, and shower only when doctor gives permission.   3. No swimming or submersion in water while implanted with pump.   4. No contact sports or engaging in jumping activities.   5. Always have a backup controller, charged spare batteries, and battery clips nearby at all times in case of emergency.   6. Call the doctor or hospital contact person if any change in how the pump sounds, feels, or works.   7. Plan to sleep only when connected to the power module.   8. Do not sleep on your stomach.   9. Keep a backup system controller, charged batteries, battery clips, and flashlight near you during sleep in case of electrical power outage.   10.  Exit site care including dressing changes, monitoring for infection, and importance of keeping percutaneous lead stabilized at all times.     Extended the option to have one of our current patients and caregiver(s) come to talk with them about living on support to assist with decision making. Will arrange for this tomorrow.   Reviewed pictures of VAD drive line, site care, dressing  changes, and drive line stabilization including securement attachment device and abdominal binder. Discussed with pt and family that they will be required to purchase dressing supplies as long as patient has the VAD in place.   Reinforced need for 24 hour/7 day week caregivers; pt designated Steven [name] as his caregiver. He will also need to abide by sternal precautions with no lifting >10lbs, pushing, pulling and will need assistance with adapting to new life style with VAD equipment and care.   Intermacs patient survival statistics through March 2024 reviewed with patient and caregiver as follows:    The patient understands that from this discussion it does not mean that they will receive the device, but that depends on an extensive evaluation process. The patient is aware of the fact that if at anytime they want to stop the evaluation process they can.  All questions have been answered at this time and contact information was provided should they encounter any further questions.  They are both agreeable at this time to the evaluation process and will move forward.     Simmie Davies RN,BSN VAD Coordinator   Office: [phone] 24/7 VAD Pager: [phone]

## 2023-03-01 NOTE — Progress Notes (Signed)
Patient returned to room via wheelchair by SWOT RN and transporter at 1210 on 03/01/23.

## 2023-03-01 NOTE — Progress Notes (Signed)
Peripherally Inserted Central Catheter Placement  The IV Nurse has discussed with the patient and/or persons authorized to consent for the patient, the purpose of this procedure and the potential benefits and risks involved with this procedure.  The benefits include less needle sticks, lab draws from the catheter, and the patient may be discharged home with the catheter. Risks include, but not limited to, infection, bleeding, blood clot (thrombus formation), and puncture of an artery; nerve damage and irregular heartbeat and possibility to perform a PICC exchange if needed/ordered by physician.  Alternatives to this procedure were also discussed.  [facility] [employer] PICC patient education guide, fact sheet on infection prevention and patient information card has been provided to patient /or left at bedside.    PICC Placement Documentation  PICC Triple Lumen 03/01/23 Right Brachial 38 cm 0 cm (Active)  Indication for Insertion or Continuance of Line Vasoactive infusions 03/01/23 1745  Exposed Catheter (cm) 0 cm 03/01/23 1745  Site Assessment Clean, Dry, Intact 03/01/23 1745  Lumen #1 Status Flushed;Saline locked;Blood return noted 03/01/23 1745  Lumen #2 Status Flushed;Saline locked;Blood return noted 03/01/23 1745  Lumen #3 Status Flushed;Saline locked;Blood return noted 03/01/23 1745  Dressing Type Transparent;Securing device 03/01/23 1745  Dressing Status Antimicrobial disc in place;Clean, Dry, Intact 03/01/23 1745  Line Adjustment (NICU/IV Team Only) No 03/01/23 1745  Dressing Intervention New dressing;Adhesive placed at insertion site (IV team only);Other (Comment) 03/01/23 1745  Dressing Change Due 03/08/23 03/01/23 1745       Reginia Forts Albarece 03/01/2023, 5:46 PM

## 2023-03-01 NOTE — Plan of Care (Signed)
  Problem: Education: Goal: Knowledge of General Education information will improve Description: Including pain rating scale, medication(s)/side effects and non-pharmacologic comfort measures Outcome: Progressing   Problem: Clinical Measurements: Goal: Respiratory complications will improve Outcome: Progressing   Problem: Coping: Goal: Level of anxiety will decrease Outcome: Progressing   

## 2023-03-01 NOTE — Progress Notes (Signed)
Cardiology Progress Note  Patient ID: [name] MRN: [id number] DOB: 04/11/1956 Date of Encounter: 03/01/2023  Primary Cardiologist: Nicki Guadalajara, MD  Subjective   Chief Complaint: SOB  HPI: Started on milrinone.  Feeling better.  Good urine output.  Advanced heart failure evaluation for LVAD.  ROS:  All other ROS reviewed and negative. Pertinent positives noted in the HPI.     Inpatient Medications  Scheduled Meds:  amiodarone  200 mg Oral Daily   atorvastatin  80 mg Oral Daily   spironolactone  12.5 mg Oral Daily   tamsulosin  0.4 mg Oral QPC supper   [START ON 03/02/2023] warfarin  5 mg Oral Once per day on Tuesday Thursday   And   [START ON 03/02/2023] warfarin  2.5 mg Oral Q M,W,F,S,S -1800   warfarin  7.5 mg Oral ONCE-1600   Warfarin - Pharmacist Dosing Inpatient   Does not apply q1600   Continuous Infusions:  heparin 1,200 Units/hr (03/01/23 0859)   milrinone 0.125 mcg/kg/min (02/28/23 1735)   PRN Meds: acetaminophen **OR** acetaminophen, ondansetron **OR** ondansetron (ZOFRAN) IV, mouth rinse   Vital Signs   Vitals:   02/28/23 2024 03/01/23 0101 03/01/23 0424 03/01/23 0825  BP: 94/61 99/71 100/68 113/75  Pulse: 60 61 60 65  Resp: 17 17 17 18   Temp: 98.5 F (36.9 C) 97.6 F (36.4 C) 98.3 F (36.8 C) 98.6 F (37 C)  TempSrc: Oral Oral Oral Oral  SpO2: 99% 95% 100% 96%  Weight:   67.3 kg   Height:        Intake/Output Summary (Last 24 hours) at 03/01/2023 0904 Last data filed at 03/01/2023 0432 Gross per 24 hour  Intake 264.39 ml  Output 2350 ml  Net -2085.61 ml      03/01/2023    4:24 AM 02/28/2023    5:44 AM 02/27/2023    4:42 AM  Last 3 Weights  Weight (lbs) 148 lb 4.8 oz 152 lb 12.5 oz 158 lb 8.2 oz  Weight (kg) 67.268 kg 69.3 kg 71.9 kg      Telemetry  Overnight telemetry shows Apaced 60s, which I personally reviewed.    Physical Exam   Vitals:   02/28/23 2024 03/01/23 0101 03/01/23 0424 03/01/23 0825  BP: 94/61 99/71 100/68 113/75  Pulse:  60 61 60 65  Resp: 17 17 17 18   Temp: 98.5 F (36.9 C) 97.6 F (36.4 C) 98.3 F (36.8 C) 98.6 F (37 C)  TempSrc: Oral Oral Oral Oral  SpO2: 99% 95% 100% 96%  Weight:   67.3 kg   Height:        Intake/Output Summary (Last 24 hours) at 03/01/2023 0904 Last data filed at 03/01/2023 0432 Gross per 24 hour  Intake 264.39 ml  Output 2350 ml  Net -2085.61 ml       03/01/2023    4:24 AM 02/28/2023    5:44 AM 02/27/2023    4:42 AM  Last 3 Weights  Weight (lbs) 148 lb 4.8 oz 152 lb 12.5 oz 158 lb 8.2 oz  Weight (kg) 67.268 kg 69.3 kg 71.9 kg    Body mass index is 21.9 kg/m.  General: Well nourished, well developed, in no acute distress Head: Atraumatic, normal size  Eyes: PEERLA, EOMI  Neck: Supple, JVD 8 to 10 cm of water Endocrine: No thryomegaly Cardiac: Normal S1, S2; RRR; no murmurs, rubs, or gallops Lungs: Diminished breath sounds in the right lung base Abd: Soft, nontender, no hepatomegaly  Ext: No edema,  pulses 2+ Musculoskeletal: No deformities, BUE and BLE strength normal and equal Skin: Warm and dry, no rashes   Neuro: Alert and oriented to person, place, time, and situation, CNII-XII grossly intact, no focal deficits  Psych: Normal mood and affect   Labs  High Sensitivity Troponin:   Recent Labs  Lab 02/01/23 1312 02/01/23 1447 02/26/23 0231 02/26/23 0410  TROPONINIHS 19* 17 7 13      Cardiac EnzymesNo results for input(s): "TROPONINI" in the last 168 hours. No results for input(s): "TROPIPOC" in the last 168 hours.  Chemistry Recent Labs  Lab 02/26/23 0410 02/27/23 0416 02/28/23 0325 02/28/23 1322 02/28/23 1323 03/01/23 0322  NA 135 135 132* 137 137 130*  K 4.0 4.0 3.6 4.0 4.0 3.9  CL 108 105 99  --   --  98  CO2 18* 22 22  --   --  20*  GLUCOSE 166* 127* 101*  --   --  104*  BUN 38* 34* 36*  --   --  44*  CREATININE 1.89* 2.06* 1.93*  --   --  1.95*  CALCIUM 8.0* 8.5* 8.7*  --   --  8.3*  PROT 6.0*  --  6.7  --   --   --   ALBUMIN 3.0*  --  3.2*  --    --   --   AST 70*  --  24  --   --   --   ALT 46*  --  24  --   --   --   ALKPHOS 73  --  60  --   --   --   BILITOT 0.8  --  2.1*  --   --   --   GFRNONAA 38* 34* 37*  --   --  37*  ANIONGAP 9 8 11   --   --  12    Hematology Recent Labs  Lab 02/26/23 0231 02/26/23 0253 02/27/23 0416 02/28/23 1322 02/28/23 1323  WBC 9.9  --  10.3  --   --   RBC 5.17  --  4.49  --   --   HGB 16.3   < > 13.8 14.3 14.3  HCT 50.5   < > 41.9 42.0 42.0  MCV 97.7  --  93.3  --   --   MCH 31.5  --  30.7  --   --   MCHC 32.3  --  32.9  --   --   RDW 14.6  --  14.3  --   --   PLT 225  --  138*  --   --    < > = values in this interval not displayed.   BNP Recent Labs  Lab 02/26/23 0231  BNP 1,060.3*    DDimer No results for input(s): "DDIMER" in the last 168 hours.   Radiology  CARDIAC CATHETERIZATION  Result Date: 02/28/2023 Successful right heart catheterization via the right antecubital vein. Normal RA pressure, mild pulmonary hypertension, mildly elevated wedge pressure and moderately reduced cardiac output/index. RA: 5 mmHg RV 43/3 with an RVEDP of 8 mmHg PW: 26/37 with a mean of 19 mmHg.  Prominent V wave. PA: 44/13 with a mean of 27 mmHg. PA sat is 67% with cardiac output of 3.95 and cardiac index of 2.15. 2.  Severe right-sided pericardial calcifications of unclear etiology.  No evidence of constriction by hemodynamic evaluation. Recommendations: Recommend gentle diuresis. Anticoagulation can be resumed. Consider advanced heart failure consult.   ECHOCARDIOGRAM COMPLETE  Result Date: 02/27/2023    ECHOCARDIOGRAM REPORT   Patient Name:   Steven [name] Date of Exam: 02/27/2023 Medical Rec #:  [id number]    Height:       69.0 in Accession #:    [id number]   Weight:       158.5 lb Date of Birth:  [birth year]    BSA:          1.872 m Patient Age:    67 years     BP:           96/65 mmHg Patient Gender: M            HR:           65 bpm. Exam Location:  Inpatient Procedure: 2D Echo, Cardiac Doppler, Color  Doppler and Intracardiac            Opacification Agent Indications:    CHF  History:        Patient has prior history of Echocardiogram examinations, most                 recent 05/09/2022. CHF, CAD, Defibrillator; Risk                 Factors:Hypertension.  Sonographer:    Darlys Gales Referring Phys: [id number] MAURICIO DANIEL ARRIEN IMPRESSIONS  1. There is no left ventricular thrombus (Definity was used). . Left ventricular ejection fraction, by estimation, is <20%. The left ventricle has severely decreased function. The left ventricle demonstrates global hypokinesis. The left ventricular internal cavity size was moderately dilated. Left ventricular diastolic parameters are consistent with Grade II diastolic dysfunction (pseudonormalization). Elevated left atrial pressure. There is akinesis of the left ventricular, entire anteroseptal wall, anterior wall and apical segment.  2. Right ventricular systolic function is mildly reduced. The right ventricular size is normal. There is moderately elevated pulmonary artery systolic pressure. The estimated right ventricular systolic pressure is 55.4 mmHg.  3. Left atrial size was moderately dilated.  4. The mitral valve is normal in structure. Mild mitral valve regurgitation.  5. The aortic valve is tricuspid. There is mild thickening of the aortic valve. Aortic valve regurgitation is not visualized. Aortic valve sclerosis is present, with no evidence of aortic valve stenosis.  6. The inferior vena cava is dilated in size with <50% respiratory variability, suggesting right atrial pressure of 15 mmHg. FINDINGS  Left Ventricle: There is no left ventricular thrombus (Definity was used). Left ventricular ejection fraction, by estimation, is <20%. The left ventricle has severely decreased function. The left ventricle demonstrates global hypokinesis. Definity contrast agent was given IV to delineate the left ventricular endocardial borders. The left ventricular internal cavity  size was moderately dilated. There is no left ventricular hypertrophy. Left ventricular diastolic parameters are consistent with Grade  II diastolic dysfunction (pseudonormalization). Elevated left atrial pressure.  LV Wall Scoring: The mid and distal anterior wall, entire anterior septum, entire apex, and mid inferoseptal segment are akinetic. The antero-lateral wall, inferior wall, posterior wall, basal anterior segment, and basal inferoseptal segment are hypokinetic. There is no left ventricular thrombus (Definity was used). Right Ventricle: The right ventricular size is normal. No increase in right ventricular wall thickness. Right ventricular systolic function is mildly reduced. There is moderately elevated pulmonary artery systolic pressure. The tricuspid regurgitant velocity is 3.18 m/s, and with an assumed right atrial pressure of 15 mmHg, the estimated right ventricular systolic pressure is 55.4 mmHg. Left Atrium: Left atrial size was moderately dilated. Right Atrium:  Right atrial size was normal in size. Pericardium: There is no evidence of pericardial effusion. Mitral Valve: The mitral valve is normal in structure. Mild mitral valve regurgitation, with centrally-directed jet. Tricuspid Valve: The tricuspid valve is normal in structure. Tricuspid valve regurgitation is trivial. Aortic Valve: The aortic valve is tricuspid. There is mild thickening of the aortic valve. Aortic valve regurgitation is not visualized. Aortic valve sclerosis is present, with no evidence of aortic valve stenosis. Aortic valve mean gradient measures 3.0  mmHg. Aortic valve peak gradient measures 4.4 mmHg. Pulmonic Valve: The pulmonic valve was grossly normal. Pulmonic valve regurgitation is trivial. No evidence of pulmonic stenosis. Aorta: The aortic root is normal in size and structure. Venous: The inferior vena cava is dilated in size with less than 50% respiratory variability, suggesting right atrial pressure of 15 mmHg.  IAS/Shunts: No atrial level shunt detected by color flow Doppler. Additional Comments: A is visualized in the right ventricle and right atrium.  LEFT VENTRICLE PLAX 2D LVIDd:         6.10 cm Diastology LVIDs:         5.80 cm LV e' medial:    3.92 cm/s LV PW:         0.80 cm LV E/e' medial:  22.8 LV IVS:        0.90 cm LV e' lateral:   8.49 cm/s                        LV E/e' lateral: 10.5  RIGHT VENTRICLE RV S prime:     9.68 cm/s TAPSE (M-mode): 1.1 cm LEFT ATRIUM             Index        RIGHT ATRIUM           Index LA Vol (A2C):   60.8 ml 32.49 ml/m  RA Area:     17.10 cm LA Vol (A4C):   73.3 ml 39.16 ml/m  RA Volume:   43.80 ml  23.40 ml/m LA Biplane Vol: 66.6 ml 35.58 ml/m  AORTIC VALVE AV Vmax:           105.00 cm/s AV Vmean:          78.000 cm/s AV VTI:            0.202 m AV Peak Grad:      4.4 mmHg AV Mean Grad:      3.0 mmHg LVOT Vmax:         82.20 cm/s LVOT Vmean:        58.600 cm/s LVOT VTI:          0.174 m LVOT/AV VTI ratio: 0.86  AORTA Ao Root diam: 3.20 cm MITRAL VALVE               TRICUSPID VALVE MV Area (PHT): 4.00 cm    TR Peak grad:   40.4 mmHg MV E velocity: 89.50 cm/s  TR Vmax:        318.00 cm/s MV A velocity: 31.30 cm/s MV E/A ratio:  2.86        SHUNTS                            Systemic VTI: 0.17 m Rachelle Hora Croitoru MD Electronically signed by Thurmon Fair MD Signature Date/Time: 02/27/2023/4:29:18 PM    Final     Cardiac Studies  [facility] 02/28/2023 Successful right heart catheterization  via the right antecubital vein.   Normal RA pressure, mild pulmonary hypertension, mildly elevated wedge pressure and moderately reduced cardiac output/index.   RA: 5 mmHg RV 43/3 with an RVEDP of 8 mmHg PW: 26/37 with a mean of 19 mmHg.  Prominent V wave. PA: 44/13 with a mean of 27 mmHg. PA sat is 67% with cardiac output of 3.95 and cardiac index of 2.15.  Patient Profile  67 year old male with history of anterior STEMI in 2012, ischemic cardiomyopathy, LV thrombus on Coumadin, ventricular  tachycardia, hyperlipidemia, previous diabetes, CKD stage III admitted on 02/26/2023 for acute on chronic systolic heart failure.   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  Started on milrinone by advanced heart failure.  They will take over his care.  Will continue with diuresis for an additional day or per heart failure management.  Continue Aldactone.  Not a good candidate for digoxin.  Add back farxiga.   # VT -on amiodarone  # LV apical thrombus -Restart home Coumadin.  Ordered heparin for bridging.  # CAD -No angina.  Not on aspirin as he is on Coumadin.  Continue high intensity statin  General cardiology will sign off.  Advanced heart failure team to continue care.   For questions or updates, please contact [facility] Please consult [email or web address] for contact info under        Signed, Gerri Spore T. Flora Lipps, MD, [facility]  [facility]  03/01/2023 9:04 AM

## 2023-03-01 NOTE — Consult Note (Signed)
Consultation Note Date: 03/01/2023   Patient Name: Steven [name]  DOB: 24-Aug-1955  MRN: [id number]  Age / Sex: 67 y.o., male  PCP: Girtha Rm Eli Phillips, MD Referring Physician: Noralee Stain, DO  Reason for Consultation: Establishing goals of care  HPI/Patient Profile: 67 y.o. male  with past medical history of ischemic cardiomyopathy, systolic heart failure with a EF of less than 20%, LV thrombus, ventricular tachycardia s/p ICD, hypertension, CKD stage 3b, hyperlipidemia and hypertension  admitted on 02/26/2023 with dyspnea.   Patient was recently admitted for heart failure exacerbation from 4/21 to 4/24.  He is now admitted again with another acute heart failure exacerbation, acute hypoxic/hypercapnic respiratory failure secondary to cardiogenic pulmonary edema, AKI on CKD 3.  Advanced heart failure team is beginning workup for possible LVAD. PMT has been consulted to assist with LVAD evaluation, goals of care conversation.  Clinical Assessment and Goals of Care:  I have reviewed medical records including EPIC notes, labs and imaging, assessed the patient and then met at the bedside to discuss diagnosis prognosis, GOC, EOL wishes, disposition and options.  I introduced Palliative Medicine as specialized medical care for people living with serious illness. It focuses on providing relief from the symptoms and stress of a serious illness. The goal is to improve quality of life for both the patient and the family.  We discussed a brief life review of the patient and then focused on their current illness.   I attempted to elicit values and goals of care important to the patient.    Medical History Review and Understanding:  Patient reports a good understanding and severity of his illness and thorough updates from his care team.  Anticipates another conversation with his physician regarding LVAD later this  afternoon.  Social History: Patient is unmarried.  He has 1 daughter.  He reports that he is well supported by friends and family, both locally and out of state.  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  He is concerned about managing his finances during a prolonged recovery should he undergo LVAD placement.  Palliative Symptoms: Fatigue  Advance Directives: A detailed discussion regarding advanced directives was had.  Patient is interested in completing this admission, naming his daughter is primary HCPOA and daughter's mother/his friends as secondary HCPOA.  Code Status: Concepts specific to code status, artifical feeding and hydration, and rehospitalization were considered and discussed.   Discussion: Patient shares that he is struggled with his quality of life since his April hospitalization and that he does not find it to currently be acceptable.  This is the main reason why he is leaning towards proceeding with LVAD, despite not liking the idea of having "tubes and batteries attached to me."  He would feel his quality of life would improve if he could have a social/sex life again, not feel so tired, travel more.  At the same time, he is quite concerned about how he will manage his finances during this prolonged recovery.  He is already getting calls about bills from his missed work due to illness up until this point. Patient understands that he would have less time if he did not proceed with placement and thus states that he does not really have any other options.  Explored whether he would have any other limitations to his care, such as artificial feeding or dependency on mechanical ventilation.  He tells me he has not thought about this or "gotten that far yet," but his initial feeling  is that if he already had an LVAD then requiring additional tubes to live would be too much and going too far.  I encouraged him to share his thoughts and feelings with his family,  especially as he plans to name his healthcare power of attorney.  We discussed planning for the worst while hoping for the best and he understands the importance of this.   The difference between aggressive medical intervention and comfort care was considered in light of the patient's goals of care. Hospice and Palliative Care services outpatient were explained and offered.   Discussed the importance of continued conversation with family and the medical providers regarding overall plan of care and treatment options, ensuring decisions are within the context of the patient's values and GOCs.   Questions and concerns were addressed.  Hard Choices booklet left for review. The family was encouraged to call with questions or concerns.  PMT will continue to support holistically.   SUMMARY OF RECOMMENDATIONS   -Continue full code/full scope treatment -Patient is leaning toward proceeding with LVAD if offered -Spiritual care consult for assistance with advanced directive completion -Psychosocial and emotional support provided -Patient has concerns about his finances during a prolonged recovery period, TOC consulted for assistance -PMT will continue to follow and support   Prognosis:  Unable to determine  Discharge Planning: To Be Determined      Primary Diagnoses: Present on Admission:  History of VT  CAD (coronary artery disease)  CKD stage 3a, GFR 45-59 ml/min (HCC)  Malignant neoplasm of prostate (HCC)  Hypertension  Alcohol abuse  AF (atrial fibrillation) (HCC)  Acute on chronic systolic congestive heart failure (HCC)   Physical Exam Vitals and nursing note reviewed.  Constitutional:      General: He is not in acute distress. Cardiovascular:     Rate and Rhythm: Normal rate.  Pulmonary:     Effort: Pulmonary effort is normal.  Neurological:     Mental Status: He is alert and oriented to person, place, and time. Mental status is at baseline.  Psychiatric:        Thought  Content: Thought content normal.    Vital Signs: BP 113/75 (BP Location: Left Arm)   Pulse 65   Temp 98.6 F (37 C) (Oral)   Resp 18   Ht 5\' 9"  (1.753 m)   Wt 67.3 kg   SpO2 96%   BMI 21.90 kg/m  Pain Scale: 0-10   Pain Score: 0-No pain   SpO2: SpO2: 96 % O2 Device:SpO2: 96 % O2 Flow Rate: .O2 Flow Rate (L/min): 2 L/min   Palliative Assessment/Data: tbd     MDM: high   Uel Davidow Jeni Salles, PA-C  Palliative Medicine Team Team phone # [phone]  Thank you for allowing the Palliative Medicine Team to assist in the care of this patient. Please utilize secure chat with additional questions, if there is no response within 30 minutes please call the above phone number.  Palliative Medicine Team providers are available by phone from 7am to 7pm daily and can be reached through the team cell phone.  Should this patient require assistance outside of these hours, please call the patient's attending physician.

## 2023-03-01 NOTE — Consult Note (Addendum)
Advanced Heart Failure Rounding Note  PCP-Cardiologist: Nicki Guadalajara, MD   Subjective:    Echo EF < 20% RV mildly reduced. RHC yesterday with RA 5, mPCW 19 and low output w/ CI 2.15. Started on milrinone. Diuresed further w/ IV lasix.   Today on milrinone 0.125 through PIV. No central access for co-ox/ CVP monitoring.  2.4L in UOP yesterday. Overall net - 8.5L since admit. Wt down another 4 lb, 10 overall.   SCr stable past 24 hr, 1.93>>1.95  SBPs upper 90s-low 100s.    Feels ok currently. No complaints. Denies resting dyspnea. No CP. Slept ok last night w/o orthopnea.    Objective:   Weight Range: 67.3 kg Body mass index is 21.9 kg/m.   Vital Signs:   Temp:  [97.6 F (36.4 C)-98.5 F (36.9 C)] 98.3 F (36.8 C) (09/05 0424) Pulse Rate:  [60-91] 60 (09/05 0424) Resp:  [12-28] 17 (09/05 0424) BP: (91-114)/(59-72) 100/68 (09/05 0424) SpO2:  [95 %-100 %] 100 % (09/05 0424) Weight:  [67.3 kg] 67.3 kg (09/05 0424) Last BM Date : 02/28/23  Weight change: Filed Weights   02/27/23 0442 02/28/23 0544 03/01/23 0424  Weight: 71.9 kg 69.3 kg 67.3 kg    Intake/Output:   Intake/Output Summary (Last 24 hours) at 03/01/2023 0813 Last data filed at 03/01/2023 0432 Gross per 24 hour  Intake 264.39 ml  Output 2350 ml  Net -2085.61 ml      Physical Exam    General:  thin and weak appearing. No resp difficulty HEENT: Normal Neck: Supple. JVP 6-7 cm . Carotids 2+ bilat; no bruits. No lymphadenopathy or thyromegaly appreciated. Cor: PMI nondisplaced. Regular rate & rhythm. No rubs, gallops or murmurs. Lungs: decreased BS at the bases bilaterally, no wheezing  Abdomen: Soft, nontender, nondistended. No hepatosplenomegaly. No bruits or masses. Good bowel sounds. Extremities: No cyanosis, clubbing, rash, edema Neuro: Alert & orientedx3, cranial nerves grossly intact. moves all 4 extremities w/o difficulty. Affect pleasant   Telemetry   NSR 60s, 1 5 beat run of NSVT  EKG     No new EKG to review   Labs    CBC Recent Labs    02/27/23 0416 02/28/23 1322 02/28/23 1323  WBC 10.3  --   --   HGB 13.8 14.3 14.3  HCT 41.9 42.0 42.0  MCV 93.3  --   --   PLT 138*  --   --    Basic Metabolic Panel Recent Labs    21/30/86 0325 02/28/23 1322 02/28/23 1323 03/01/23 0322  NA 132*   < > 137 130*  K 3.6   < > 4.0 3.9  CL 99  --   --  98  CO2 22  --   --  20*  GLUCOSE 101*  --   --  104*  BUN 36*  --   --  44*  CREATININE 1.93*  --   --  1.95*  CALCIUM 8.7*  --   --  8.3*  MG  --   --   --  2.3   < > = values in this interval not displayed.   Liver Function Tests Recent Labs    02/28/23 0325  AST 24  ALT 24  ALKPHOS 60  BILITOT 2.1*  PROT 6.7  ALBUMIN 3.2*   No results for input(s): "LIPASE", "AMYLASE" in the last 72 hours. Cardiac Enzymes No results for input(s): "CKTOTAL", "CKMB", "CKMBINDEX", "TROPONINI" in the last 72 hours.  BNP: BNP (last 3  results) Recent Labs    10/15/22 1110 11/21/22 1122 02/26/23 0231  BNP 2,147.0* 914.3* 1,060.3*    ProBNP (last 3 results) Recent Labs    04/18/22 0958  PROBNP 587*     D-Dimer No results for input(s): "DDIMER" in the last 72 hours. Hemoglobin A1C No results for input(s): "HGBA1C" in the last 72 hours. Fasting Lipid Panel No results for input(s): "CHOL", "HDL", "LDLCALC", "TRIG", "CHOLHDL", "LDLDIRECT" in the last 72 hours. Thyroid Function Tests No results for input(s): "TSH", "T4TOTAL", "T3FREE", "THYROIDAB" in the last 72 hours.  Invalid input(s): "FREET3"  Other results:   Imaging    CARDIAC CATHETERIZATION  Result Date: 02/28/2023 Successful right heart catheterization via the right antecubital vein. Normal RA pressure, mild pulmonary hypertension, mildly elevated wedge pressure and moderately reduced cardiac output/index. RA: 5 mmHg RV 43/3 with an RVEDP of 8 mmHg PW: 26/37 with a mean of 19 mmHg.  Prominent V wave. PA: 44/13 with a mean of 27 mmHg. PA sat is 67% with  cardiac output of 3.95 and cardiac index of 2.15. 2.  Severe right-sided pericardial calcifications of unclear etiology.  No evidence of constriction by hemodynamic evaluation. Recommendations: Recommend gentle diuresis. Anticoagulation can be resumed. Consider advanced heart failure consult.     Medications:     Scheduled Medications:  amiodarone  200 mg Oral Daily   atorvastatin  80 mg Oral Daily   spironolactone  12.5 mg Oral Daily   tamsulosin  0.4 mg Oral QPC supper    Infusions:  milrinone 0.125 mcg/kg/min (02/28/23 1735)    PRN Medications: acetaminophen **OR** acetaminophen, ondansetron **OR** ondansetron (ZOFRAN) IV, mouth rinse    Patient Profile   Steven [name] is a 67 y.o. 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 AHF team to see with A/C combined systolic and diastolic heart failure and inability to tolerate GDMT 2/2 hypotension due to low output. Work up for advanced therapies.   Assessment/Plan    1. 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 PA 44/13 (27), PA sat 67%, CO 3.95. CI 2.15. Normal RA pressure, mild pulmonary hypertension, mildly elevated wedge pressure and low output  - NYHA IV on admission - Volume overloaded on admission, overall diuresed ~8L. Near euvolemic on exam today. Will transition to PO diuretics. Torsemide 20 mg daily (every other day PTA)  - GDMT limited by hypotension - now on Milrinone 0.125 mcg/kg/min to help with renal recovery - Continue spiro 12.5 mg daily - Previously on Farxiga, Entresto. Plan to add back GDMT as BP and renal function  allows  - no ? 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 LVAD coordinators aware. 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 restart today if no further anticipated procedures    H/o VT s/p Medtronic ICD - likely scar mediated - Continue amiodarone 200 mg daily - s/p MDT ICD, last event 8/24 - Keep K>4, Mg >2    Length of Stay: 3  Brittainy Simmons, PA-C  03/01/2023, 8:13 AM  Advanced Heart Failure Team Pager [phone] (M-F; 7a - 5p)  Please contact [facility] for night-coverage after hours (5p -7a ) and weekends on [email or web address]  Patient seen and examined with the above-signed Advanced Practice Provider and/or Housestaff. I personally reviewed laboratory data, imaging studies and relevant notes. I independently examined the patient and formulated the important aspects of the plan. I have edited the note to reflect any of my changes or salient points. I have personally discussed the plan with the patient and/or family.  Denies CP or SOB. Now on milrinone 0.125 Rhythm stable (1 5 beat run NSVT). Scr remains 1.9  General:  Weak appearing. No resp difficulty HEENT: normal Neck: supple. no JVD. Carotids 2+ bilat; no bruits. No lymphadenopathy or thryomegaly appreciated. Cor: Regular rate & rhythm. No rubs, gallops or murmurs. Lungs: clear Abdomen: soft, nontender, nondistended. No hepatosplenomegaly. No bruits or masses. Good bowel sounds. Extremities: no cyanosis, clubbing, rash, edema Neuro: alert & orientedx3, cranial nerves grossly intact. moves all 4 extremities w/o difficulty. Affect  pleasant  VAD w/u now underway. Will need renal function to improve prior to proceeding. Would place PICC to help optimize HF. Continue milrinone. Stop spiro.   If Scr not improving, may need swan guidance with short-term IABP as bridge to VAD.   Arvilla Meres, MD  5:42 PM

## 2023-03-01 NOTE — Progress Notes (Signed)
PROGRESS NOTE    Steven [name]  YQI:[id number] DOB: 08/24/1955 DOA: 02/26/2023 PCP: [name], MD     Brief Narrative:  Steven [name] is a 67 y.o. male with past medical history significant for chronic systolic congestive heart failure (LVEF <20%), ischemic cardiomyopathy, history of LV thrombus, history of ventricular tachycardia s/p ICD, CKD stage IIIb, hyperlipidemia, hypertension who presented to [facility] ED on 9/2 from home via EMS with acute onset shortness of breath.  Also associated with nausea.  Dyspnea worse with exertion.  Denies lower extremity edema but endorses orthopnea/PND.  Patient was recently seen in cardiology outpatient clinic, his torsemide was decreased to 20 mg every other day due to decreased GFR.  Patient reports compliance with his medications and adherence to a salt/fluid restricted diet.  Patient denies chest pain, no abdominal pain. On EMS arrival patient was noted to be tachypneic with elevated JVD and SpO2 78% on room air.  Patient was given nitroglycerin, placed on CPAP and transported to the ED for further evaluation.   In the ED, temperature 96.8 F, HR 85, RR 31, BP 127/66, SpO2 92% on BiPAP.  VBG with pH 7.280, pCO2 40.5, pO2 112.  WBC 9.9, hemoglobin 16.3, platelet count 225.  Sodium 135, potassium 4.0, chloride 108, CO2 18, glucose 166, BUN 38, creatinine 1.89.  AST 70, ALT 46, total bilirubin 0.8.  High sensitive troponin 7 followed by 13.  BNP 1060.3.  SARS Cov 2 PCR negative.  Chest x-ray with severe diffuse bilateral airspace disease worsening on the right and improving on left, reflective of edema versus infection.  Due to heart failure, cardiology, advanced heart failure team were consulted.  He underwent heart cath.  Patient was started on milrinone drip.  New events last 24 hours / Subjective: Patient feeling well this morning without any new complaints today  Assessment & Plan:   Active Problems:   Acute on chronic systolic congestive  heart failure (HCC)   History of VT   CAD (coronary artery disease)   AF (atrial fibrillation) (HCC)   Hypertension   CKD stage 3a, GFR 45-59 ml/min (HCC)   Malignant neoplasm of prostate (HCC)   Alcohol abuse   Acute on chronic congestive heart failure (HCC)   Acute respiratory failure with hypoxia (HCC)     Acute hypoxic/hypercapnic respiratory failure, POA Acute on chronic systolic congestive heart failure Pulmonary edema Patient presenting with acute onset shortness of breath 2 hours prior to arrival to the ED.  Patient was found tachypneic and hypoxic with SpO2 78% on room air.  Not oxygen dependent at baseline.  Recently had dose reduction of his torsemide outpatient due to worsening renal function.  Patient states he hears to salt/fluid restricted diet.  Patient was on BiPAP on arrival with a VBG with elevated pCO2 of 40.5.  Chest x-ray with findings consistent with pulmonary edema.  COVID PCR negative. TTE with LVEF less than 20%, grade 2 diastolic dysfunction, previous LV thrombus not visualized, IVC dilated, RVSP 55.4 mmHg. -- Cardiology consulted -- Status post heart cath -- Farxiga, spironolactone -- Now on milrinone -- Working up for LVAD   Transaminitis: Resolved LFTs elevated, likely secondary to hepatic congestion in the setting of CHF exacerbation as above   History of LV thrombus Repeat TTE shows no LV thrombus. -- IV heparin   History of ventricular tachycardia s/p ICD -- Amiodarone    Paroxysmal atrial fibrillation -- IV heparin   AKI on CKD stage IIIb Baseline  creatinine 1.3-1.7.  Creatinine 1.89 on admission, trended up to 2.06.  Etiology likely secondary to volume overload versus ATN with poor perfusion due to low output cardiomyopathy.   CAD Ischemic cardiomyopathy -- Continue statin   Essential hypertension -- Spironolactone   Hyperlipidemia -- Atorvastatin   Malignant neoplasm of prostate -- Continue outpatient follow-up with  oncology   DVT prophylaxis: IV heparin SCDs Start: 02/26/23 1502  Code Status: Full code Family Communication: None Disposition Plan: Home Status is: Inpatient Remains inpatient appropriate because: IV therapies, advanced heart failure team    Antimicrobials:  Anti-infectives (From admission, onward)    None        Objective: Vitals:   02/28/23 2024 03/01/23 0101 03/01/23 0424 03/01/23 0825  BP: 94/61 99/71 100/68 113/75  Pulse: 60 61 60 65  Resp: 17 17 17 18   Temp: 98.5 F (36.9 C) 97.6 F (36.4 C) 98.3 F (36.8 C) 98.6 F (37 C)  TempSrc: Oral Oral Oral Oral  SpO2: 99% 95% 100% 96%  Weight:   67.3 kg   Height:        Intake/Output Summary (Last 24 hours) at 03/01/2023 1557 Last data filed at 03/01/2023 1125 Gross per 24 hour  Intake 357.58 ml  Output 1900 ml  Net -1542.42 ml   Filed Weights   02/27/23 0442 02/28/23 0544 03/01/23 0424  Weight: 71.9 kg 69.3 kg 67.3 kg    Examination:  General exam: Appears calm and comfortable  Respiratory system: Clear to auscultation. Respiratory effort normal. No respiratory distress. No conversational dyspnea.  Cardiovascular system: S1 & S2 heard, RRR. No murmurs. No pedal edema. Gastrointestinal system: Abdomen is nondistended, soft and nontender. Normal bowel sounds heard. Central nervous system: Alert and oriented. No focal neurological deficits. Speech clear.  Extremities: Symmetric in appearance  Skin: No rashes, lesions or ulcers on exposed skin  Psychiatry: Judgement and insight appear normal. Mood & affect appropriate.   Data Reviewed: I have personally reviewed following labs and imaging studies  CBC: Recent Labs  Lab 02/26/23 0231 02/26/23 0253 02/27/23 0416 02/28/23 0325 02/28/23 1322 02/28/23 1323  WBC 9.9  --  10.3 9.2  --   --   NEUTROABS 2.8  --   --   --   --   --   HGB 16.3 17.3* 13.8 13.4 14.3 14.3  HCT 50.5 51.0 41.9 40.5 42.0 42.0  MCV 97.7  --  93.3 93.3  --   --   PLT 225  --  138*  141*  --   --    Basic Metabolic Panel: Recent Labs  Lab 02/26/23 0410 02/27/23 0416 02/28/23 0325 02/28/23 1322 02/28/23 1323 03/01/23 0322  NA 135 135 132* 137 137 130*  K 4.0 4.0 3.6 4.0 4.0 3.9  CL 108 105 99  --   --  98  CO2 18* 22 22  --   --  20*  GLUCOSE 166* 127* 101*  --   --  104*  BUN 38* 34* 36*  --   --  44*  CREATININE 1.89* 2.06* 1.93*  --   --  1.95*  CALCIUM 8.0* 8.5* 8.7*  --   --  8.3*  MG  --   --   --   --   --  2.3   GFR: Estimated Creatinine Clearance: 34.5 mL/min (A) (by C-G formula based on SCr of 1.95 mg/dL (H)). Liver Function Tests: Recent Labs  Lab 02/26/23 0410 02/28/23 0325  AST 70* 24  ALT  46* 24  ALKPHOS 73 60  BILITOT 0.8 2.1*  PROT 6.0* 6.7  ALBUMIN 3.0* 3.2*   No results for input(s): "LIPASE", "AMYLASE" in the last 168 hours. No results for input(s): "AMMONIA" in the last 168 hours. Coagulation Profile: Recent Labs  Lab 02/26/23 0231 02/27/23 0416 02/28/23 0325 03/01/23 0322  INR 2.8* 2.9* 2.5* 1.7*   Cardiac Enzymes: No results for input(s): "CKTOTAL", "CKMB", "CKMBINDEX", "TROPONINI" in the last 168 hours. BNP (last 3 results) Recent Labs    04/18/22 0958  PROBNP 587*   HbA1C: No results for input(s): "HGBA1C" in the last 72 hours. CBG: No results for input(s): "GLUCAP" in the last 168 hours. Lipid Profile: No results for input(s): "CHOL", "HDL", "LDLCALC", "TRIG", "CHOLHDL", "LDLDIRECT" in the last 72 hours. Thyroid Function Tests: No results for input(s): "TSH", "T4TOTAL", "FREET4", "T3FREE", "THYROIDAB" in the last 72 hours. Anemia Panel: No results for input(s): "VITAMINB12", "FOLATE", "FERRITIN", "TIBC", "IRON", "RETICCTPCT" in the last 72 hours. Sepsis Labs: Recent Labs  Lab 02/26/23 1215 02/26/23 1615  LATICACIDVEN 1.7 1.9    Recent Results (from the past 240 hour(s))  SARS Coronavirus 2 by RT PCR (hospital order, performed in [facility] hospital lab) *cepheid single result test* Anterior Nasal  Swab     Status: None   Collection Time: 02/26/23  2:25 AM   Specimen: Anterior Nasal Swab  Result Value Ref Range Status   SARS Coronavirus 2 by RT PCR NEGATIVE NEGATIVE Final    Comment: Performed at [facility], [address], [place], [place] [address]      Radiology Studies: DG Orthopantogram  Result Date: 03/01/2023 CLINICAL DATA:  Preoperative evaluation to rule out surgical contraindications. No symptoms. EXAM: ORTHOPANTOGRAM/PANORAMIC COMPARISON:  None Available. FINDINGS: Unremarkable teeth with no cavities or periapical abscesses seen. IMPRESSION: Negative. Electronically Signed   By: Beckie Salts M.D.   On: 03/01/2023 15:46   [place] EKG SITE RITE  Result Date: 03/01/2023 If Site Rite image not attached, placement could not be confirmed due to current cardiac rhythm.  CARDIAC CATHETERIZATION  Result Date: 02/28/2023 Successful right heart catheterization via the right antecubital vein. Normal RA pressure, mild pulmonary hypertension, mildly elevated wedge pressure and moderately reduced cardiac output/index. RA: 5 mmHg RV 43/3 with an RVEDP of 8 mmHg PW: 26/37 with a mean of 19 mmHg.  Prominent V wave. PA: 44/13 with a mean of 27 mmHg. PA sat is 67% with cardiac output of 3.95 and cardiac index of 2.15. 2.  Severe right-sided pericardial calcifications of unclear etiology.  No evidence of constriction by hemodynamic evaluation. Recommendations: Recommend gentle diuresis. Anticoagulation can be resumed. Consider advanced heart failure consult.      Scheduled Meds:  amiodarone  200 mg Oral Daily   atorvastatin  80 mg Oral Daily   dapagliflozin propanediol  10 mg Oral Daily   spironolactone  12.5 mg Oral Daily   tamsulosin  0.4 mg Oral QPC supper   Continuous Infusions:  heparin 1,200 Units/hr (03/01/23 1125)   milrinone 0.125 mcg/kg/min (03/01/23 1125)     LOS: 3 days   Time spent: 35 minutes   Noralee Stain, DO [facility] 03/01/2023, 3:57 PM   Available  via Epic secure chat 7am-7pm After these hours, please refer to coverage provider listed on [email or web address]

## 2023-03-01 NOTE — Progress Notes (Signed)
Patient returned to room via wheelchair by SWOT RN, Anne at 1400 on 03/01/23.

## 2023-03-01 NOTE — Plan of Care (Signed)

## 2023-03-01 NOTE — Consult Note (Signed)
ANTICOAGULATION CONSULT NOTE - Follow-up  Pharmacy Consult for heparin Indication: apical mural thrombus  No Known Allergies  Patient Measurements: Height: 5\' 9"  (175.3 cm) Weight: 67.3 kg (148 lb 4.8 oz) IBW/kg (Calculated) : 70.7 Heparin Dosing Weight: ~72 kg  Vital Signs: Temp: 97.8 F (36.6 C) (09/05 1915) Temp Source: Oral (09/05 1915) BP: 100/62 (09/05 1915) Pulse Rate: 62 (09/05 1915)  Labs: Recent Labs    02/27/23 0416 02/28/23 0325 02/28/23 1322 02/28/23 1323 03/01/23 0322 03/01/23 1720  HGB 13.8 13.4 14.3 14.3  --   --   HCT 41.9 40.5 42.0 42.0  --   --   PLT 138* 141*  --   --   --   --   LABPROT 30.5* 26.9*  --   --  20.1*  --   INR 2.9* 2.5*  --   --  1.7*  --   HEPARINUNFRC  --   --   --   --   --  0.35  CREATININE 2.06* 1.93*  --   --  1.95*  --     Estimated Creatinine Clearance: 34.5 mL/min (A) (by C-G formula based on SCr of 1.95 mg/dL (H)).  Medical History: Past Medical History:  Diagnosis Date   Acute MI, anterior wall (HCC)    AICD (automatic cardioverter/defibrillator) present    CAD (coronary artery disease)    2D ECHO, 07/13/2011 - EF <25%, LV moderatelty dilated, LA moderately dilatedLEXISCAN, 12/14/2011 - moderate-severe perfusion defect seen in the basal anteroseptal, mid anterior, apicacl anterior, apical, apical inferior, and apical lateral regions, post-stress EF 25%, new EKG changes from baseline abnormalities   Cancer [facility])    Prostate   CHF (congestive heart failure) (HCC) 2012   Hypertension 08/08/2021   Inguinal hernia, left    Pneumonia    November 2023   Pre-diabetes     Assessment: 67 yo male with history of HFrEF, CAD, VT, 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  Warfarin started on admission and held for procedures (last dose 9/2).  Pharmacy consulted to dose heparin.   INR 1.7 subtherapeutic as expected off warfarin for past two days. CBC stable from 9/4  (add-on ordered).   PM Update: Heparin level 0.35 on current rate of 1200 units/hr (after bolus 4300 units x1).  Goal of Therapy:  INR 2-3 Heparin level 0.3-0.7 units/ml Monitor platelets by anticoagulation protocol: Yes   Plan:  Continue heparin infusion 1200 units/hr Recheck in ~8 hour heparin level (ok with AM labs) Daily CBC, heparin level Monitor for s/sx of bleeding  Link Snuffer, PharmD, BCPS, BCCCP Clinical Pharmacist Please refer to [facility] for [facility] numbers 03/01/2023  7:35 PM

## 2023-03-01 NOTE — Consult Note (Addendum)
ANTICOAGULATION CONSULT NOTE - Initial Consult  Pharmacy Consult for heparin Indication: apical mural thrombus  No Known Allergies  Patient Measurements: Height: 5\' 9"  (175.3 cm) Weight: 67.3 kg (148 lb 4.8 oz) IBW/kg (Calculated) : 70.7 Heparin Dosing Weight: ~72 kg  Vital Signs: Temp: 98.3 F (36.8 C) (09/05 0424) Temp Source: Oral (09/05 0424) BP: 100/68 (09/05 0424) Pulse Rate: 60 (09/05 0424)  Labs: Recent Labs    02/27/23 0416 02/28/23 0325 02/28/23 1322 02/28/23 1323 03/01/23 0322  HGB 13.8  --  14.3 14.3  --   HCT 41.9  --  42.0 42.0  --   PLT 138*  --   --   --   --   LABPROT 30.5* 26.9*  --   --  20.1*  INR 2.9* 2.5*  --   --  1.7*  CREATININE 2.06* 1.93*  --   --  1.95*    Estimated Creatinine Clearance: 34.5 mL/min (A) (by C-G formula based on SCr of 1.95 mg/dL (H)).  Medical History: Past Medical History:  Diagnosis Date   Acute MI, anterior wall (HCC)    AICD (automatic cardioverter/defibrillator) present    CAD (coronary artery disease)    2D ECHO, 07/13/2011 - EF <25%, LV moderatelty dilated, LA moderately dilatedLEXISCAN, 12/14/2011 - moderate-severe perfusion defect seen in the basal anteroseptal, mid anterior, apicacl anterior, apical, apical inferior, and apical lateral regions, post-stress EF 25%, new EKG changes from baseline abnormalities   Cancer [facility])    Prostate   CHF (congestive heart failure) (HCC) 2012   Hypertension 08/08/2021   Inguinal hernia, left    Pneumonia    November 2023   Pre-diabetes     Assessment: 67 yo male with history of HFrEF, CAD, VT, 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  Warfarin started on admission and held for RHC 9/4 (last dose 9/2).  Pharmacy consulted to resume warfarin with heparin bridge.  PTA warfarin regimen: 5 mg Tues/Thurs, 2.5 mg AOD.  INR therapeutic 8/28.  Last dose PTA 9/1 @1230 .  Of note, started on amiodarone load 8/8 and decreased to  200 mg daily 8/23.  INR 1.7 subtherapeutic as expected off warfarin for past two days. CBC stable from 9/4 (add-on ordered).   Goal of Therapy:  INR 2-3 Heparin level 0.3-0.7 units/ml Monitor platelets by anticoagulation protocol: Yes   Plan:  Give heparin bolus 4300 units x1 Start heparin infusion 1200 units/hr 6 hour heparin level Continue heparin bridge until INR therapeutic for at least 2 measurements Warfarin 7.5 mg x1 (to keep with ~22.5mg /week) Then, resume home regimen - 5 mg Tues/Thurs, 2.5 mg every other day Daily CBC, heparin level Monitor for s/sx of bleeding  Trixie Rude, PharmD Clinical Pharmacist 03/01/2023  8:23 AM

## 2023-03-01 NOTE — Progress Notes (Deleted)
Peripherally Inserted Central Catheter Placement  The IV Nurse has discussed with the patient and/or persons authorized to consent for the patient, the purpose of this procedure and the potential benefits and risks involved with this procedure.  The benefits include less needle sticks, lab draws from the catheter, and the patient may be discharged home with the catheter. Risks include, but not limited to, infection, bleeding, blood clot (thrombus formation), and puncture of an artery; nerve damage and irregular heartbeat and possibility to perform a PICC exchange if needed/ordered by physician.  Alternatives to this procedure were also discussed.  Bard Power PICC patient education guide, fact sheet on infection prevention and patient information card has been provided to patient /or left at bedside.  PICC inserted by Reginia Forts, RN  PICC Placement Documentation  PICC Single Lumen 03/01/23 Right Basilic 40 cm 1 cm (Active)  Indication for Insertion or Continuance of Line Prolonged intravenous therapies;Home intravenous therapies (PICC only) 03/01/23 1611  Exposed Catheter (cm) 1 cm 03/01/23 1611  Site Assessment Clean, Dry, Intact 03/01/23 1611  Line Status Flushed;Saline locked;Blood return noted 03/01/23 1611  Dressing Type Transparent;Securing device 03/01/23 1611  Dressing Status Antimicrobial disc in place;Clean, Dry, Intact 03/01/23 1611  Line Care Connections checked and tightened 03/01/23 1611  Line Adjustment (NICU/IV Team Only) No 03/01/23 1611  Dressing Intervention New dressing 03/01/23 1611  Dressing Change Due 03/08/23 03/01/23 1611       Steven [name], [name] 03/01/2023, 4:13 PM

## 2023-03-01 NOTE — Progress Notes (Signed)
Patient transported to PFTs by transporter with Dewayne Hatch (SWOT RN) via wheelchair.

## 2023-03-01 NOTE — Consult Note (Signed)
[address]            [place] [address]          [phone]       [name] Medical Record #[id number] Date of Birth: 11/09/1955  No ref. provider found Ziglar, Eli Phillips, MD  Chief Complaint:   Shortness of breath Chief Complaint  Patient presents with   Respiratory Distress  I was asked to evaluate this 67 year old hypertensive male reformed smoker for possible implantable mechanical cardiac support/VAD therapy.  History of Present Illness:     Patient examined, images of most recent echocardiogram, chest CT scan, right heart cath data, and surgical pathology reports all reviewed.  The patient was admitted for the second time in 6 months with class IV heart failure and pulmonary edema on chest x-ray.  Patient has known history of ischemic cardiomyopathy with history of previous PCI therapy, MI, and known reduced LV function.  The patient required IV Lasix diuresis and milrinone for improvement in symptoms.  Echocardiogram demonstrates LVEF less than 20% with LV diameter 6 cm, no significant AI or TR, mild AS moderate RV dysfunction and calcification of the right side of the pericardium.  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  Abdominal CT scan demonstrates a right inguinal hernia without obstruction.  Patient has history of atrial fibrillation and is on chronic Coumadin.  He has had no significant bleeding complications other than a subdural hematoma which required surgery approximately 10 years ago.  Patient has had an AICD placed (Medtronic,) which has fired once earlier this year for ventricular tachycardia associated  with syncope.  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   The patient has history of chronic kidney disease stage IIIa with baseline creatinine on admission 2.0.  Urinalysis pending.  Patient has previous history of heavy alcohol intake with baseline elevated bilirubin 2.1, without cirrhosis noted on CT scan    Current Activity/ Functional Status: Patient is single and has a decreased functional status due to his heart failure   Past Medical History:  Diagnosis Date   Acute MI, anterior wall (HCC)    AICD (automatic cardioverter/defibrillator) present    CAD (coronary artery disease)    2D ECHO, 07/13/2011 - EF <25%, LV moderatelty dilated, LA moderately dilatedLEXISCAN, 12/14/2011 - moderate-severe perfusion defect seen in the basal anteroseptal, mid anterior, apicacl anterior, apical, apical inferior, and apical lateral regions, post-stress EF 25%, new EKG changes from baseline abnormalities   Cancer [facility])    Prostate   CHF (congestive heart failure) (HCC) 2012   Hypertension 08/08/2021   Inguinal hernia, left    Pneumonia    November 2023   Pre-diabetes     Past Surgical History:  Procedure Laterality Date   CARDIAC CATHETERIZATION  11/25/2010   Predilation balloon-Apex monorail 2x36mm, Cutting balloon-2.25x70mm, resulting in a reduction of 100% stenosis down to less than 10%   CARDIAC CATHETERIZATION  07/12/2010   LAD stented with a 3.5x33mm bare-metal Veriflex stent resulting in a reduction of 100% lesion to 0%   CARDIAC  DEFIBRILLATOR PLACEMENT  03/02/2011   Medtronic Protecta XT DR, model [id number], serial #[id number]   COLONOSCOPY     GOLD SEED IMPLANT N/A 07/21/2021   Procedure: GOLD SEED IMPLANT;  Surgeon: Heloise Purpura, MD;  Location: [facility];  Service: Urology;  Laterality: N/A;  NEEDS 30 MIN   HERNIA REPAIR  2018   ICD GENERATOR CHANGEOUT N/A 02/24/2019   Procedure: ICD GENERATOR CHANGEOUT;  Surgeon: Thurmon Fair, MD;   Location: [facility] INVAS[facility] CV [facility];  Service: Cardiovascular;  Laterality: N/A;   PACEMAKER PLACEMENT  07/14/2010   Temporary placement of pacemaker, if rhythm issue continues will need a permanent device   RIGHT HEART CATH N/A 02/28/2023   Procedure: RIGHT HEART CATH;  Surgeon: Iran Ouch, MD;  Location: [facility] INV[facility] CV [facility];  Service: Cardiovascular;  Laterality: N/A;   SPACE OAR INSTILLATION N/A 07/21/2021   Procedure: SPACE OAR INSTILLATION;  Surgeon: Heloise Purpura, MD;  Location: [facility];  Service: Urology;  Laterality: N/A;    Social History   Tobacco Use  Smoking Status Former   Current packs/day: 0.00   Types: Cigarettes, Cigars   Quit date: 2012   Years since quitting: 12.6  Smokeless Tobacco Never    Social History   Substance and Sexual Activity  Alcohol Use Yes   Alcohol/week: 2.0 - 3.0 standard drinks of alcohol   Types: 2 - 3 drink(s) per week    Social History   Socioeconomic History   Marital status: Single    Spouse name: Not on file   Number of children: 1   Years of education: Not on file   Highest education level: Bachelor's degree (e.g., BA, AB, BS)  Occupational History   Occupation: works on Systems analyst  Tobacco Use   Smoking status: Former    Current packs/day: 0.00    Types: Cigarettes, Cigars    Quit date: 2012    Years since quitting: 12.6   Smokeless tobacco: Never  Vaping Use   Vaping status: Never Used  Substance and Sexual Activity   Alcohol use: Yes    Alcohol/week: 2.0 - 3.0 standard drinks of alcohol    Types: 2 - 3 drink(s) per week   Drug use: No   Sexual activity: Not Currently  Other Topics Concern   Not on file  Social History Narrative   Not on file   Social Determinants of Health   Financial Resource Strain: Low Risk  (05/08/2022)   Overall Financial Resource Strain (CARDIA)    Difficulty of Paying Living Expenses: Not hard at all  Food Insecurity: No Food Insecurity (02/26/2023)   Hunger Vital Sign    Worried About  Running Out of Food in the Last Year: Never true    Ran Out of Food in the Last Year: Never true  Transportation Needs: No Transportation Needs (02/26/2023)   PRAPARE - Administrator, Civil Service (Medical): No    Lack of Transportation (Non-Medical): No  Physical Activity: Not on file  Stress: Not on file  Social Connections: Unknown (11/08/2021)   Received from [facility]   Social Network    Social Network: Not on file  Intimate Partner Violence: Not At Risk (02/26/2023)   Humiliation, Afraid, Rape, and Kick questionnaire    Fear of Current or Ex-Partner: No    Emotionally Abused: No    Physically Abused: No    Sexually Abused: No    No Known Allergies  Current Facility-Administered Medications  Medication Dose Route Frequency Provider Last  Rate Last Admin   acetaminophen (TYLENOL) tablet 650 mg  650 mg Oral Q6H PRN Iran Ouch, MD       Or   acetaminophen (TYLENOL) suppository 650 mg  650 mg Rectal Q6H PRN Iran Ouch, MD       amiodarone (PACERONE) tablet 200 mg  200 mg Oral Daily [name] A, MD   200 mg at 03/01/23 0902   atorvastatin (LIPITOR) tablet 80 mg  80 mg Oral Daily [name] A, MD   80 mg at 03/01/23 4259   dapagliflozin propanediol (FARXIGA) tablet 10 mg  10 mg Oral Daily Sande Rives, MD   10 mg at 03/01/23 1123   heparin ADULT infusion 100 units/mL (25000 units/283mL)  1,200 Units/hr Intravenous Continuous Noralee Stain, DO 12 mL/hr at 03/01/23 1125 1,200 Units/hr at 03/01/23 1125   milrinone (PRIMACOR) 20 MG/100 ML (0.2 mg/mL) infusion  0.125 mcg/kg/min Intravenous Continuous Brynda Peon L, NP 2.6 mL/hr at 03/01/23 1125 0.125 mcg/kg/min at 03/01/23 1125   ondansetron (ZOFRAN) tablet 4 mg  4 mg Oral Q6H PRN Iran Ouch, MD       Or   ondansetron (ZOFRAN) injection 4 mg  4 mg Intravenous Q6H PRN Iran Ouch, MD       Oral care mouth rinse  15 mL Mouth Rinse PRN Iran Ouch, MD       spironolactone  (ALDACTONE) tablet 12.5 mg  12.5 mg Oral Daily [name] A, MD   12.5 mg at 03/01/23 0902   tamsulosin (FLOMAX) capsule 0.4 mg  0.4 mg Oral QPC supper [name] A, MD   0.4 mg at 02/28/23 1615   [START ON 03/02/2023] warfarin (COUMADIN) tablet 5 mg  5 mg Oral Once per day on Tuesday Thursday Noralee Stain, DO       And   [START ON 03/02/2023] warfarin (COUMADIN) tablet 2.5 mg  2.5 mg Oral Q M,W,F,S,S -1800 Noralee Stain, DO       warfarin (COUMADIN) tablet 7.5 mg  7.5 mg Oral ONCE-1600 Noralee Stain, DO       Warfarin - Pharmacist Dosing Inpatient   Does not apply q1600 Noralee Stain, DO         History reviewed. No pertinent family history.   Review of Systems:     Cardiac Review of Systems: Y or N  Chest Pain [    ]  Resting SOB [   ] Exertional SOB  [  y]  Orthopnea [place]  ]   Pedal Edema [   ]    Palpitations [ y ] Syncope  [ y ]   Presyncope [   ]  General Review of Systems: [Y] = yes [  ]=no Constitional: recent weight change [  ]; anorexia [  y]; fatigue [ y ]; nausea [  ]; night sweats [  ]; fever [  ]; or chills [  ];  Dental: poor dentition[  ];   Eye : blurred vision [  ]; diplopia [   ]; vision changes [  ];  Amaurosis fugax[  ]; Resp: cough [  ];  wheezing[  ];  hemoptysis[  ]; shortness of breath[  ]; paroxysmal nocturnal dyspnea[y  ]; ydyspnea on exertion[  ]; or orthopnea[  ];  GI:  gallstones[  ], vomiting[  ];  dysphagia[  ]; melena[  ];  hematochezia [  ]; heartburn[  ];   Hx of  Colonoscopy[  ]; GU: kidney stones [  ]; hematuria[  ];   dysuria [  ];  nocturia[  ];  history of     obstruction [  ]; history of prostatectomy for Gleason stage VII adenocarcinoma                 Skin: rash, swelling[  ];, hair loss[  ];  peripheral edema[  ];  or itching[  ]; Musculosketetal: myalgias[  ];  joint swelling[  ];  joint erythema[  ];   joint pain[  ];  back pain[  ];  Heme/Lymph: bruising[ y ];  bleeding[  ];  anemia[  ];  Neuro: TIA[  ];  headaches[  ];  stroke[  ];  vertigo[  ];  seizures[  ];   paresthesias[  ];  difficulty walking[  ]; history of subdural hematoma  Psych:depression[  ]; anxiety[  ];  Endocrine: diabetes[  y prediabetes];  thyroid dysfunction[  ];  Immunizations: Flu [  ]; Pneumococcal[  ];  Other:  Physical Exam: BP 113/75 (BP Location: Left Arm)   Pulse 65   Temp 98.6 F (37 C) (Oral)   Resp 18   Ht 5\' 9"  (1.753 m)   Wt 67.3 kg   SpO2 96%   BMI 21.90 kg/m        General:  thin and weak appearing. No resp difficulty HEENT: Normal Neck: Supple. JVP 6-7 cm . Carotids 2+ bilat; no bruits. No lymphadenopathy or thyromegaly appreciated. Cor: PMI nondisplaced. Regular rate & rhythm. No rubs, gallops or murmurs. Lungs: decreased BS at the bases bilaterally, no wheezing  Abdomen: Soft, nontender, nondistended. No hepatosplenomegaly. No bruits or masses. Good bowel sounds. Extremities: No cyanosis, clubbing, rash, edema Neuro: Alert & orientedx3, cranial nerves grossly intact. moves all 4 extremities w/o difficulty. Affect pleasant    Diagnostic Studies & Laboratory data:  PFTs adequate for sternotomy DLCO 67%       Recent Lab Findings: Lab Results  Component Value Date   WBC 9.2 02/28/2023   HGB 14.3 02/28/2023   HCT 42.0 02/28/2023   PLT 141 (L) 02/28/2023   GLUCOSE 104 (H) 03/01/2023   CHOL 121 04/18/2022   TRIG 50 04/18/2022   HDL 45 04/18/2022   LDLCALC 64 04/18/2022   ALT 24 02/28/2023   AST 24 02/28/2023   NA 130 (L) 03/01/2023   K 3.9 03/01/2023   CL 98 03/01/2023   CREATININE 1.95 (H) 03/01/2023   BUN 44 (H) 03/01/2023   CO2 20 (L) 03/01/2023   TSH 2.280 04/23/2020   INR 1.7 (H) 03/01/2023   HGBA1C 6.4 (H) 05/08/2022      Assessment / Plan:      The patient appears to be an appropriate candidate to proceed with evaluation for implantable VAD as destination  therapy indication. Concerns are maintaining adequate renal function prior to mechanical support and  his nutritional status with low body wt and albumin levels as well  as adequate social support.

## 2023-03-01 NOTE — TOC Initial Note (Signed)
Transition of Care [facility]) - Initial/Assessment Note    Patient Details  Name: Steven [name] MRN: [id number] Date of Birth: [birth year]  Transition of Care [facility]) CM/SW Contact:    Nicanor Bake Phone Number: [phone] 03/01/2023, 11:12 AM  Clinical Narrative:  [facility] CSW met with the pt at bedside. CSW built rapport with the pt. Pt stated that he lives at home alone. Pt stated that he has no history of HH services and does not use any equipment. Pt stated that he has a scale at home. 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 Pt stated that he thinks he has insurance through his job but needs to verify. Pt stated that he needs a work note so he can keep his job. CSW explained that she is going to schedule the pt a pcp hospital follow up appointment. TOC will continue following.                 Expected Discharge Plan: Home/Self Care Barriers to Discharge: Continued Medical Work up   Patient Goals and CMS Choice Patient states their goals for this hospitalization and ongoing recovery are:: return home          Expected Discharge Plan and Services       Living arrangements for the past 2 months: Single Family Home                                      Prior Living Arrangements/Services Living arrangements for the past 2 months: Single Family Home Lives with:: Self Patient language and need for interpreter reviewed:: Yes Do you feel safe going back to the place where you live?: Yes      Need for Family Participation in Patient Care: Yes (Comment) Care giver support system in place?: Yes (comment)   Criminal Activity/Legal Involvement Pertinent to Current Situation/Hospitalization: No - Comment as needed  Activities of Daily Living Home Assistive Devices/Equipment: None ADL Screening (condition at time of admission) Patient's cognitive ability adequate to safely complete daily activities?: Yes Is the patient deaf or have difficulty hearing?:  No Does the patient have difficulty seeing, even when wearing glasses/contacts?: No Does the patient have difficulty concentrating, remembering, or making decisions?: No Patient able to express need for assistance with ADLs?: Yes Does the patient have difficulty dressing or bathing?: No Independently performs ADLs?: Yes (appropriate for developmental age) Does the patient have difficulty walking or climbing stairs?: Yes (States he has discomfort in knees when climbing stairs) Weakness of Legs: None Weakness of Arms/Hands: None  Permission Sought/Granted                  Emotional Assessment Appearance:: Appears older than stated age Attitude/Demeanor/Rapport: Engaged Affect (typically observed): Appropriate Orientation: : Oriented to Self, Oriented to Place, Oriented to  Time, Oriented to Situation Alcohol / Substance Use: Not Applicable Psych Involvement: No (comment)  Admission diagnosis:  Acute respiratory failure with hypoxia (HCC) [J96.01] Heart failure (HCC) [I50.9] Acute on chronic congestive heart failure, unspecified heart failure type [facility]) [I50.9] Patient Active Problem List   Diagnosis Date Noted   Acute respiratory failure with hypoxia (HCC) 02/27/2023   Acute on chronic congestive heart failure (HCC) 02/26/2023   Demand ischemia 10/18/2022   Acute on chronic HFrEF (heart failure with reduced ejection fraction) (HCC) 10/15/2022   Acute diastolic CHF (congestive heart failure) (HCC) 05/08/2022  Acute on chronic systolic congestive heart failure (HCC) 05/07/2022   Subdural hematoma (HCC) 08/08/2021   Myocardial infarct (HCC) 08/08/2021   Hypertension 08/08/2021   Head injury 08/08/2021   Alcohol abuse 08/08/2021   AF (atrial fibrillation) (HCC) 08/08/2021   Genetic testing 06/21/2021   Malignant neoplasm of prostate (HCC) 05/17/2021   ICD (implantable cardioverter-defibrillator) battery depletion 02/24/2019   Bradycardia, drug induced    CKD stage 3a, GFR  45-59 ml/min (HCC) 11/20/2018   Chronic combined systolic and diastolic heart failure (HCC) 08/17/2015   Hyperlipidemia LDL goal <70 08/31/2014   S/P subdural hematoma evacuation 03/04/2014   Long term current use of anticoagulant therapy 08/25/2013   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 ([address]), [place] - [address] [place] ([place]) [place] [address] Phone: [phone] Fax: [phone]  [facility] [address] [place] [address] Phone: [phone] Fax: [phone]  [facility] [address] [place] [address] Phone: [phone] Fax: [phone]     Social Determinants of Health (SDOH) Social History: SDOH Screenings   Food Insecurity: No Food Insecurity (02/26/2023)  Housing: High Risk (02/26/2023)  Transportation Needs: No Transportation Needs (02/26/2023)  Utilities: Not At Risk (02/26/2023)  Alcohol Screen: Low Risk  (05/08/2022)  Financial Resource Strain: Low Risk  (05/08/2022)  Social Connections: Unknown (11/08/2021)   Received from [facility]  Tobacco Use: Medium Risk (02/26/2023)   SDOH Interventions:     Readmission Risk Interventions     No data to display

## 2023-03-01 NOTE — Care Management Important Message (Signed)
Important Message  Patient Details  Name: Steven [name] MRN: [id number] Date of Birth: 1956/05/05   Medicare Important Message Given:  Yes     [name] 03/01/2023, 8:38 AM

## 2023-03-02 ENCOUNTER — Other Ambulatory Visit (HOSPITAL_COMMUNITY): Payer: 59

## 2023-03-02 ENCOUNTER — Ambulatory Visit: Payer: 59

## 2023-03-02 ENCOUNTER — Inpatient Hospital Stay (HOSPITAL_COMMUNITY): Payer: 59

## 2023-03-02 DIAGNOSIS — Z515 Encounter for palliative care: Secondary | ICD-10-CM | POA: Diagnosis not present

## 2023-03-02 DIAGNOSIS — I5023 Acute on chronic systolic (congestive) heart failure: Secondary | ICD-10-CM | POA: Diagnosis not present

## 2023-03-02 DIAGNOSIS — Z01810 Encounter for preprocedural cardiovascular examination: Secondary | ICD-10-CM

## 2023-03-02 DIAGNOSIS — J9601 Acute respiratory failure with hypoxia: Secondary | ICD-10-CM | POA: Diagnosis not present

## 2023-03-02 DIAGNOSIS — I5043 Acute on chronic combined systolic (congestive) and diastolic (congestive) heart failure: Secondary | ICD-10-CM | POA: Diagnosis not present

## 2023-03-02 DIAGNOSIS — Z7189 Other specified counseling: Secondary | ICD-10-CM | POA: Diagnosis not present

## 2023-03-02 DIAGNOSIS — I509 Heart failure, unspecified: Secondary | ICD-10-CM | POA: Diagnosis not present

## 2023-03-02 LAB — PREALBUMIN: Prealbumin: 18 mg/dL (ref 18–38)

## 2023-03-02 LAB — URIC ACID: Uric Acid, Serum: 6 mg/dL (ref 3.7–8.6)

## 2023-03-02 LAB — TYPE AND SCREEN
ABO/RH(D): AB POS
Antibody Screen: NEGATIVE

## 2023-03-02 LAB — LIPID PANEL
Cholesterol: 101 mg/dL (ref 0–200)
HDL: 33 mg/dL — ABNORMAL LOW (ref >40)
LDL Cholesterol: 61 mg/dL (ref 0–99)
Total CHOL/HDL Ratio: 3.1 ratio
Triglycerides: 35 mg/dL (ref <150)
VLDL: 7 mg/dL (ref 0–40)

## 2023-03-02 LAB — COOXEMETRY PANEL
Carboxyhemoglobin: 1.6 % — ABNORMAL HIGH (ref 0.5–1.5)
Carboxyhemoglobin: 1.8 % — ABNORMAL HIGH (ref 0.5–1.5)
Methemoglobin: <0.7 % (ref 0.0–1.5)
Methemoglobin: <0.7 % (ref 0.0–1.5)
O2 Saturation: 67.2 %
O2 Saturation: 65.2 %
Total hemoglobin: 13.7 g/dL (ref 12.0–16.0)
Total hemoglobin: 13.5 g/dL (ref 12.0–16.0)

## 2023-03-02 LAB — HEPARIN LEVEL (UNFRACTIONATED): Heparin Unfractionated: 0.4 [IU]/mL (ref 0.30–0.70)

## 2023-03-02 LAB — HEMOGLOBIN A1C
Hgb A1c MFr Bld: 6.6 % — ABNORMAL HIGH (ref 4.8–5.6)
Mean Plasma Glucose: 142.72 mg/dL

## 2023-03-02 LAB — BASIC METABOLIC PANEL
Anion gap: 12 (ref 5–15)
BUN: 34 mg/dL — ABNORMAL HIGH (ref 8–23)
CO2: 22 mmol/L (ref 22–32)
Calcium: 8.7 mg/dL — ABNORMAL LOW (ref 8.9–10.3)
Chloride: 98 mmol/L (ref 98–111)
Creatinine, Ser: 1.64 mg/dL — ABNORMAL HIGH (ref 0.61–1.24)
GFR, Estimated: 45 mL/min — ABNORMAL LOW (ref >60)
Glucose, Bld: 115 mg/dL — ABNORMAL HIGH (ref 70–99)
Potassium: 3.9 mmol/L (ref 3.5–5.1)
Sodium: 132 mmol/L — ABNORMAL LOW (ref 135–145)

## 2023-03-02 LAB — MAGNESIUM: Magnesium: 2.3 mg/dL (ref 1.7–2.4)

## 2023-03-02 LAB — LACTATE DEHYDROGENASE: LDH: 179 U/L (ref 98–192)

## 2023-03-02 LAB — HEPATITIS C ANTIBODY: HCV Ab: NONREACTIVE

## 2023-03-02 LAB — HEPATITIS B SURFACE ANTIGEN: Hepatitis B Surface Ag: NONREACTIVE

## 2023-03-02 LAB — TSH: TSH: 2.421 u[IU]/mL (ref 0.350–4.500)

## 2023-03-02 LAB — APTT: aPTT: 104 s — ABNORMAL HIGH (ref 24–36)

## 2023-03-02 LAB — ABO/RH: ABO/RH(D): AB POS

## 2023-03-02 LAB — HEPATITIS B CORE ANTIBODY, TOTAL: Hep B Core Total Ab: REACTIVE — AB

## 2023-03-02 LAB — HIV ANTIBODY (ROUTINE TESTING W REFLEX): HIV Screen 4th Generation wRfx: NONREACTIVE

## 2023-03-02 LAB — ANTITHROMBIN III: AntiThromb III Func: 78 % (ref 75–120)

## 2023-03-02 LAB — T4, FREE: Free T4: 2.03 ng/dL — ABNORMAL HIGH (ref 0.61–1.12)

## 2023-03-02 MED ORDER — ENSURE MAX PROTEIN PO LIQD
11.0000 [oz_av] | Freq: Two times a day (BID) | ORAL | Status: DC
  Administered 2023-03-02 – 2023-03-05 (×6): 11 [oz_av] via ORAL
  Filled 2023-03-02 (×7): qty 330

## 2023-03-02 MED ORDER — ADULT MULTIVITAMIN W/MINERALS CH
1.0000 | ORAL_TABLET | Freq: Every day | ORAL | Status: DC
  Administered 2023-03-02 – 2023-03-20 (×17): 1 via ORAL
  Filled 2023-03-02 (×17): qty 1

## 2023-03-02 MED ORDER — POTASSIUM CHLORIDE CRYS ER 20 MEQ PO TBCR
40.0000 meq | EXTENDED_RELEASE_TABLET | Freq: Once | ORAL | Status: AC
  Administered 2023-03-02: 40 meq via ORAL
  Filled 2023-03-02: qty 2

## 2023-03-02 MED ORDER — FUROSEMIDE 10 MG/ML IJ SOLN
80.0000 mg | Freq: Once | INTRAMUSCULAR | Status: AC
  Administered 2023-03-02: 80 mg via INTRAVENOUS
  Filled 2023-03-02: qty 8

## 2023-03-02 NOTE — Progress Notes (Signed)
This chaplain responded to PMT PA-Josseline consult for creating/updating the Pt. Advance Directive. The chaplain understands the Pt. has an interest in naming [name] and his daughter's mother as his healthcare agents.  The chaplain is present with the Pt. and [name] for AD education. The Pt. answered clarifying questions and has decided to pause the completion of the AD until Monday after the medical team's discussion of the Pt. qualifications as an LVAD candidate.  The chaplain will plan a revisit on Tuesday.    The chaplain understands "Quality of Life" is informing the Pt. decisions. The Pt. defines QOL as being able to get outside, spend time with friends/family, and possibly working as he thinks about completing a Living Will.  The chaplain left the incomplete AD with the Pt. for review and follow up with the chaplain.  Chaplain Stephanie Acre [phone]

## 2023-03-02 NOTE — Progress Notes (Addendum)
Advanced Heart Failure Rounding Note  PCP-Cardiologist: Nicki Guadalajara, MD   Subjective:    CO-OX 65% on milrinone 0.125.  Scr down to 1.6.  Feeling well. No dyspnea.    Objective:   Weight Range: 67.6 kg Body mass index is 22.01 kg/m.   Vital Signs:   Temp:  [97.6 F (36.4 C)-98.6 F (37 C)] 97.6 F (36.4 C) (09/06 0817) Pulse Rate:  [60-63] 60 (09/06 0817) Resp:  [16-18] 18 (09/06 0817) BP: (100-108)/(62-69) 105/69 (09/06 0817) SpO2:  [92 %-100 %] 99 % (09/06 0817) Weight:  [67.6 kg] 67.6 kg (09/06 0320) Last BM Date : 03/01/23  Weight change: Filed Weights   02/28/23 0544 03/01/23 0424 03/02/23 0320  Weight: 69.3 kg 67.3 kg 67.6 kg    Intake/Output:   Intake/Output Summary (Last 24 hours) at 03/02/2023 0947 Last data filed at 03/02/2023 0805 Gross per 24 hour  Intake 1052.29 ml  Output 1625 ml  Net -572.71 ml      Physical Exam    General:  Thin male. HEENT: Normal Neck: Supple. JVP 10-12. Carotids 2+ bilat; no bruits.  Cor: PMI nondisplaced. Regular rate & rhythm. No rubs, gallops or murmurs. Lungs: Clear Abdomen: Soft, nontender, nondistended.  Extremities: No cyanosis, clubbing, rash, edema, + RUE PICC Neuro: Alert & orientedx3. Affect pleasant   Telemetry   SR 60s, intermittently Apaced  Labs    CBC Recent Labs    02/28/23 0325 02/28/23 1322 02/28/23 1323  WBC 9.2  --   --   HGB 13.4 14.3 14.3  HCT 40.5 42.0 42.0  MCV 93.3  --   --   PLT 141*  --   --    Basic Metabolic Panel Recent Labs    45/40/98 0325 02/28/23 1322 02/28/23 1323 03/01/23 0322  NA 132*   < > 137 130*  K 3.6   < > 4.0 3.9  CL 99  --   --  98  CO2 22  --   --  20*  GLUCOSE 101*  --   --  104*  BUN 36*  --   --  44*  CREATININE 1.93*  --   --  1.95*  CALCIUM 8.7*  --   --  8.3*  MG  --   --   --  2.3   < > = values in this interval not displayed.   Liver Function Tests Recent Labs    02/28/23 0325  AST 24  ALT 24  ALKPHOS 60  BILITOT 2.1*   PROT 6.7  ALBUMIN 3.2*   No results for input(s): "LIPASE", "AMYLASE" in the last 72 hours. Cardiac Enzymes No results for input(s): "CKTOTAL", "CKMB", "CKMBINDEX", "TROPONINI" in the last 72 hours.  BNP: BNP (last 3 results) Recent Labs    10/15/22 1110 11/21/22 1122 02/26/23 0231  BNP 2,147.0* 914.3* 1,060.3*    ProBNP (last 3 results) Recent Labs    04/18/22 0958  PROBNP 587*     D-Dimer No results for input(s): "DDIMER" in the last 72 hours. Hemoglobin A1C Recent Labs    03/02/23 0500  HGBA1C 6.6*   Fasting Lipid Panel Recent Labs    03/02/23 0500  CHOL 101  HDL 33*  LDLCALC 61  TRIG 35  CHOLHDL 3.1   Thyroid Function Tests Recent Labs    03/02/23 0631  TSH 2.421    Other results:   Imaging    CT CHEST ABDOMEN PELVIS WO CONTRAST  Result Date: 03/01/2023 CLINICAL DATA:  Ventricular assist device evaluation. EXAM: CT CHEST, ABDOMEN AND PELVIS WITHOUT CONTRAST TECHNIQUE: Multidetector CT imaging of the chest, abdomen and pelvis was performed following the standard protocol without IV contrast. RADIATION DOSE REDUCTION: This exam was performed according to the departmental dose-optimization program which includes automated exposure control, adjustment of the mA and/or kV according to patient size and/or use of iterative reconstruction technique. COMPARISON:  Chest radiograph 02/26/2023. Abdominopelvic CT 06/17/2021, PET CT 03/31/2021 FINDINGS: CT CHEST FINDINGS Cardiovascular: Left-sided pacemaker with lead tips in the right atrium and right ventricle. The heart is mildly enlarged. Diffuse pericardial calcifications. There are also likely subendocardial calcifications at the left ventricular apex. No significant pericardial effusion. There are coronary artery calcifications. The thoracic aorta is normal in caliber, moderate aortic atherosclerosis. Ascending aorta measures 3.7 cm greatest dimension. Main pulmonary artery is normal at 2.7 cm.  Mediastinum/Nodes: No mediastinal adenopathy. Evaluation of the hilar structures is limited in the absence of IV contrast. Lobulated fluid density structure in the right retrocrural region is stable from prior exam, of doubtful clinical significance. Minimal hiatal hernia. No thyroid nodule. The previous cystic structure inferior to the left thyroid is not definitively seen on this unenhanced exam. Lungs/Pleura: Mild to moderate emphysema. Mild scarring in the medial right lower lobe adjacent to thoracic spine osteophytes. There are hazy ground-glass opacities in both upper lobes. No definite septal thickening. No pleural fluid. Trachea and central airways are clear. No pulmonary mass or dominant nodule. Musculoskeletal: Thoracic spondylosis with anterior spurring. There are no acute or suspicious osseous abnormalities. No chest wall soft tissue abnormalities. CT ABDOMEN PELVIS FINDINGS Hepatobiliary: No focal liver abnormality is seen. No gallstones, gallbladder wall thickening, or biliary dilatation. Pancreas: Unremarkable unenhanced appearance. Spleen: Peripheral subcapsular calcifications laterally. Normal in size. Adrenals/Urinary Tract: No adrenal nodule. No hydronephrosis or renal calculi. There may be mild bilateral renal parenchymal atrophy. No renal inflammation. Partially distended urinary bladder. Stomach/Bowel: Minimal hiatal hernia. No bowel obstruction or inflammation. Moderate volume of stool in the colon. Few colonic diverticula without diverticulitis. Slight edema in the right lower small bowel mesentery is chronic, and of doubtful clinical significance. Normal appendix. Vascular/Lymphatic: Advanced aortic and branch atherosclerosis. No aortic aneurysm. No bulky abdominopelvic adenopathy in this unenhanced exam. Reproductive: Enlarged prostate gland causing nodular mass effect on the bladder base. Soft tissue density again seen in the right inguinal canal. Other: No ascites. Soft tissue density  again seen in the right inguinal canal. Diminutive fat containing umbilical hernia. Musculoskeletal: Mild lumbar degenerative change. There are no acute or suspicious osseous abnormalities. IMPRESSION: 1. Mild cardiomegaly. Diffuse pericardial calcifications. There are also likely subendocardial calcifications at the left ventricular apex. 2. Hazy ground-glass opacities in the upper lobes, may represent pulmonary edema in the appropriate clinical setting. 3. No acute abnormality in the abdomen/pelvis. 4. Enlarged prostate gland causing nodular mass effect on the bladder base. 5. Colonic diverticulosis without diverticulitis. 6. Soft tissue density again seen in the right inguinal canal, may represent a spermatic cord hernia. This is stable from prior exams. 7. The previous cystic structure inferior to the left thyroid lobe is not definitively seen on this unenhanced exam. Emphysema (ICD10-J43.9).  Aortic Atherosclerosis (ICD10-I70.0). Electronically Signed   By: Narda Rutherford M.D.   On: 03/01/2023 16:06   DG Orthopantogram  Result Date: 03/01/2023 CLINICAL DATA:  Preoperative evaluation to rule out surgical contraindications. No symptoms. EXAM: ORTHOPANTOGRAM/PANORAMIC COMPARISON:  None Available. FINDINGS: Unremarkable teeth with no cavities or periapical abscesses seen. IMPRESSION: Negative. Electronically Signed  By: Beckie Salts M.D.   On: 03/01/2023 15:46   [place] EKG SITE RITE  Result Date: 03/01/2023 If Site Rite image not attached, placement could not be confirmed due to current cardiac rhythm.    Medications:     Scheduled Medications:  amiodarone  200 mg Oral Daily   atorvastatin  80 mg Oral Daily   Chlorhexidine Gluconate Cloth  6 each Topical Daily   dapagliflozin propanediol  10 mg Oral Daily   sodium chloride flush  10-40 mL Intracatheter Q12H   spironolactone  12.5 mg Oral Daily   tamsulosin  0.4 mg Oral QPC supper    Infusions:  heparin 1,200 Units/hr (03/02/23 0226)    milrinone 0.125 mcg/kg/min (03/02/23 0043)    PRN Medications: acetaminophen **OR** acetaminophen, ondansetron **OR** ondansetron (ZOFRAN) IV, mouth rinse, sodium chloride flush    Patient Profile   Steven [name] is a 67 y.o. 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   Admitted with acute on chronic systolic and diastolic CHF with low-output. Advanced Heart Failure consulted and workup started for advanced therapies.    Assessment/Plan    1. 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 CO 3.95. CI 2.15 - NYHA IV on admission - now on Milrinone 0.125 mcg/kg/min to help with renal perfusion. CO-OX stable. - Volume status elevated today. Give 80 mg lasix IV. Set up CVP monitoring. - Stop Cleda Daub - GDMT limited by renal function and hypotension. - Previously on Farxiga, Entresto.  - no ? 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 Seen by Dr. Donata Clay.  Barriers may be renal function, nutrition/low body weight and social support.   CAD - h/o large anterior MI 2013 treated w/ DES to LAD  - No CP. HS troponin trend flat, not c/w ACS. - on statin  - no ASA given chronic coumadin   AKI on CKD IIIa - Scr up to 2 this admission, baseline ~1.2 - Scr improved to 1.6 today - Cont Milrinone to help renal function improve   Mural thrombus - On coumadin PTA, on heparin gtt   H/o VT s/p Medtronic ICD - likely scar mediated - Continue  amiodarone 200 mg daily - s/p MDT ICD, last event 8/24 - Keep K>4, Mg >2   Length of Stay: 4  FINCH, LINDSAY N, PA-C  03/02/2023, 9:47 AM  Advanced Heart Failure Team Pager [phone] (M-F; 7a - 5p)  Please contact [facility] for night-coverage after hours (5p -7a ) and weekends on [email or web address]  Patient seen and examined with the above-signed Advanced Practice Provider and/or Housestaff. I personally reviewed laboratory data, imaging studies and relevant notes. I independently examined the patient and formulated the important aspects of the plan. I have edited the note to reflect any of my changes or salient points. I have personally discussed the plan with the patient and/or family.  Remains on milrinone. Feels a bit better. Still weak. Apprehensive.  Scr improved.   General:  Thin weak appearing. No resp difficulty HEENT: normal Neck: supple. no JVD. Carotids 2+ bilat; no bruits. No lymphadenopathy or thryomegaly appreciated. Cor: Regular rate & rhythm. +s3 Lungs: clear Abdomen: soft, nontender, nondistended. No hepatosplenomegaly. No bruits or masses. Good bowel sounds. Extremities: no cyanosis, clubbing, rash, edema Neuro: alert & orientedx3, cranial nerves grossly intact. moves all 4 extremities w/o difficulty. Affect pleasant  Remains very tenuous. Co-ox and Scr improving with milrinone.   VAD w/u underway. Seen by Dr. Donata Clay concern over CKD and nutritional status.   Nutrition consult pending.  Will review at [facility] on Monday.   Long talk with him and his wife in front of Palliative team about high risk nature of procedure as well as potential benefits .  Arvilla Meres, MD  4:44 PM

## 2023-03-02 NOTE — Progress Notes (Signed)
PROGRESS NOTE    [name]  ZOX:[id number] DOB: 01-21-1956 DOA: 02/26/2023 PCP: [name], MD     Brief Narrative:  Steven [name] is a 67 y.o. male with past medical history significant for chronic systolic congestive heart failure (LVEF <20%), ischemic cardiomyopathy, history of LV thrombus, history of ventricular tachycardia s/p ICD, CKD stage IIIb, hyperlipidemia, hypertension who presented to [facility] ED on 9/2 from home via EMS with acute onset shortness of breath.  Also associated with nausea.  Dyspnea worse with exertion.  Denies lower extremity edema but endorses orthopnea/PND.  Patient was recently seen in cardiology outpatient clinic, his torsemide was decreased to 20 mg every other day due to decreased GFR.  Patient reports compliance with his medications and adherence to a salt/fluid restricted diet.  Patient denies chest pain, no abdominal pain. On EMS arrival patient was noted to be tachypneic with elevated JVD and SpO2 78% on room air.  Patient was given nitroglycerin, placed on CPAP and transported to the ED for further evaluation.   In the ED, temperature 96.8 F, HR 85, RR 31, BP 127/66, SpO2 92% on BiPAP.  VBG with pH 7.280, pCO2 40.5, pO2 112.  WBC 9.9, hemoglobin 16.3, platelet count 225.  Sodium 135, potassium 4.0, chloride 108, CO2 18, glucose 166, BUN 38, creatinine 1.89.  AST 70, ALT 46, total bilirubin 0.8.  High sensitive troponin 7 followed by 13.  BNP 1060.3.  SARS Cov 2 PCR negative.  Chest x-ray with severe diffuse bilateral airspace disease worsening on the right and improving on left, reflective of edema versus infection.  Due to heart failure, cardiology, advanced heart failure team were consulted.  He underwent heart cath.  Patient was started on milrinone drip.  Currently undergoing evaluation for LVAD  New events last 24 hours / Subjective: No new issues overnight   Assessment & Plan:   Active Problems:   Acute on chronic systolic congestive  heart failure (HCC)   History of VT   CAD (coronary artery disease)   AF (atrial fibrillation) (HCC)   Hypertension   CKD stage 3a, GFR 45-59 ml/min (HCC)   Malignant neoplasm of prostate (HCC)   Alcohol abuse   Acute on chronic congestive heart failure (HCC)   Acute respiratory failure with hypoxia (HCC)     Acute hypoxic/hypercapnic respiratory failure, POA Acute on chronic systolic congestive heart failure Pulmonary edema Patient presenting with acute onset shortness of breath 2 hours prior to arrival to the ED.  Patient was found tachypneic and hypoxic with SpO2 78% on room air.  Not oxygen dependent at baseline.  Recently had dose reduction of his torsemide outpatient due to worsening renal function.  Patient states he hears to salt/fluid restricted diet.  Patient was on BiPAP on arrival with a VBG with elevated pCO2 of 40.5.  Chest x-ray with findings consistent with pulmonary edema.  COVID PCR negative. TTE with LVEF less than 20%, grade 2 diastolic dysfunction, previous LV thrombus not visualized, IVC dilated, RVSP 55.4 mmHg. -- Cardiology consulted -- Status post heart cath -- Now on milrinone, giving dose of IV Lasix today -- Working up for LVAD   Transaminitis: Resolved LFTs elevated, likely secondary to hepatic congestion in the setting of CHF exacerbation as above   History of LV thrombus Repeat TTE shows no LV thrombus. -- IV heparin   History of ventricular tachycardia s/p ICD -- Amiodarone    Paroxysmal atrial fibrillation -- IV heparin   AKI on  CKD stage IIIb Baseline creatinine 1.3-1.7.  Creatinine 1.89 on admission, trended up to 2.06.  Etiology likely secondary to volume overload versus ATN with poor perfusion due to low output cardiomyopathy.   CAD Ischemic cardiomyopathy -- Continue statin   Essential hypertension -- Spironolactone on hold   Hyperlipidemia -- Atorvastatin   Malignant neoplasm of prostate -- Continue outpatient follow-up with  oncology   DVT prophylaxis: IV heparin SCDs Start: 02/26/23 1502  Code Status: Full code Family Communication: None Disposition Plan: Home Status is: Inpatient Remains inpatient appropriate because: IV therapies, advanced heart failure team    Antimicrobials:  Anti-infectives (From admission, onward)    None        Objective: Vitals:   03/01/23 2309 03/02/23 0320 03/02/23 0817 03/02/23 1154  BP: 101/67 104/67 105/69 99/67  Pulse: 60 60 60 61  Resp: 16 18 18 18   Temp: 97.6 F (36.4 C) 98 F (36.7 C) 97.6 F (36.4 C) 97.6 F (36.4 C)  TempSrc: Oral Oral Oral Oral  SpO2: 97% 97% 99% 98%  Weight:  67.6 kg    Height:        Intake/Output Summary (Last 24 hours) at 03/02/2023 1215 Last data filed at 03/02/2023 1121 Gross per 24 hour  Intake 959.1 ml  Output 1875 ml  Net -915.9 ml   Filed Weights   02/28/23 0544 03/01/23 0424 03/02/23 0320  Weight: 69.3 kg 67.3 kg 67.6 kg    Examination:  General exam: Appears calm and comfortable  Respiratory system:  Respiratory effort normal. No respiratory distress. No conversational dyspnea.  Psychiatry: Judgement and insight appear normal. Mood & affect appropriate.   Data Reviewed: I have personally reviewed following labs and imaging studies  CBC: Recent Labs  Lab 02/26/23 0231 02/26/23 0253 02/27/23 0416 02/28/23 0325 02/28/23 1322 02/28/23 1323  WBC 9.9  --  10.3 9.2  --   --   NEUTROABS 2.8  --   --   --   --   --   HGB 16.3 17.3* 13.8 13.4 14.3 14.3  HCT 50.5 51.0 41.9 40.5 42.0 42.0  MCV 97.7  --  93.3 93.3  --   --   PLT 225  --  138* 141*  --   --    Basic Metabolic Panel: Recent Labs  Lab 02/26/23 0410 02/27/23 0416 02/28/23 0325 02/28/23 1322 02/28/23 1323 03/01/23 0322 03/02/23 0625  NA 135 135 132* 137 137 130* 132*  K 4.0 4.0 3.6 4.0 4.0 3.9 3.9  CL 108 105 99  --   --  98 98  CO2 18* 22 22  --   --  20* 22  GLUCOSE 166* 127* 101*  --   --  104* 115*  BUN 38* 34* 36*  --   --  44* 34*   CREATININE 1.89* 2.06* 1.93*  --   --  1.95* 1.64*  CALCIUM 8.0* 8.5* 8.7*  --   --  8.3* 8.7*  MG  --   --   --   --   --  2.3 2.3   GFR: Estimated Creatinine Clearance: 41.2 mL/min (A) (by C-G formula based on SCr of 1.64 mg/dL (H)). Liver Function Tests: Recent Labs  Lab 02/26/23 0410 02/28/23 0325  AST 70* 24  ALT 46* 24  ALKPHOS 73 60  BILITOT 0.8 2.1*  PROT 6.0* 6.7  ALBUMIN 3.0* 3.2*   No results for input(s): "LIPASE", "AMYLASE" in the last 168 hours. No results for input(s): "AMMONIA" in  the last 168 hours. Coagulation Profile: Recent Labs  Lab 02/26/23 0231 02/27/23 0416 02/28/23 0325 03/01/23 0322  INR 2.8* 2.9* 2.5* 1.7*   Cardiac Enzymes: No results for input(s): "CKTOTAL", "CKMB", "CKMBINDEX", "TROPONINI" in the last 168 hours. BNP (last 3 results) Recent Labs    04/18/22 0958  PROBNP 587*   HbA1C: Recent Labs    03/02/23 0500  HGBA1C 6.6*   CBG: No results for input(s): "GLUCAP" in the last 168 hours. Lipid Profile: Recent Labs    03/02/23 0500  CHOL 101  HDL 33*  LDLCALC 61  TRIG 35  CHOLHDL 3.1   Thyroid Function Tests: Recent Labs    03/02/23 0625 03/02/23 0631  TSH  --  2.421  FREET4 2.03*  --    Anemia Panel: No results for input(s): "VITAMINB12", "FOLATE", "FERRITIN", "TIBC", "IRON", "RETICCTPCT" in the last 72 hours. Sepsis Labs: Recent Labs  Lab 02/26/23 1215 02/26/23 1615  LATICACIDVEN 1.7 1.9    Recent Results (from the past 240 hour(s))  SARS Coronavirus 2 by RT PCR (hospital order, performed in [facility] hospital lab) *cepheid single result test* Anterior Nasal Swab     Status: None   Collection Time: 02/26/23  2:25 AM   Specimen: Anterior Nasal Swab  Result Value Ref Range Status   SARS Coronavirus 2 by RT PCR NEGATIVE NEGATIVE Final    Comment: Performed at [facility], [address], [place], [place] [address]      Radiology Studies: VAS US DOPPLER PRE VAD  Result Date:  03/02/2023 PERIOPERATIVE VASCULAR EVALUATION Patient Name:  YAVIEL MALLEY [facility]  Date of Exam:   03/02/2023 Medical Rec #: [id number]     Accession #:    [id number] Date of Birth: [birth year]     Patient Gender: M Patient Age:   8 years Exam Location:  [facility] Procedure:      VAS US DOPPLER PRE VAD Referring Phys: Reuel Boom BENSIMHON --------------------------------------------------------------------------------  Indications:      Pre-VAD. Risk Factors:     Hypertension, hyperlipidemia, past history of smoking, prior                   MI, coronary artery disease. Other Factors:    AF, CKD, CHF, ICD. Comparison Study: No previous exams Performing Technologist: Jean Rosenthal RDMS, RVT Supporting Technologist: Ernestene Mention RVT, RDMS  Examination Guidelines: A complete evaluation includes B-mode imaging, spectral Doppler, color Doppler, and power Doppler as needed of all accessible portions of each vessel. Bilateral testing is considered an integral part of a complete examination. Limited examinations for reoccurring indications may be performed as noted.  Right Carotid Findings: +----------+--------+--------+--------+--------+------------------+           PSV cm/sEDV cm/sStenosisDescribeComments           +----------+--------+--------+--------+--------+------------------+ CCA Prox  85      12                                         +----------+--------+--------+--------+--------+------------------+ CCA Distal92      17                      intimal thickening +----------+--------+--------+--------+--------+------------------+ ICA Prox  87      26      1-39%   calcific                   +----------+--------+--------+--------+--------+------------------+  ICA Distal32      18                      tortuous           +----------+--------+--------+--------+--------+------------------+ ECA       96      11                                          +----------+--------+--------+--------+--------+------------------+ +----------+--------+-------+----------------+------------+           PSV cm/sEDV cmsDescribe        Arm Pressure +----------+--------+-------+----------------+------------+ Subclavian67             Multiphasic, WNL             +----------+--------+-------+----------------+------------+ +---------+--------+--+--------+-+---------+ VertebralPSV cm/s30EDV cm/s8Antegrade +---------+--------+--+--------+-+---------+ Left Carotid Findings: +----------+--------+--------+--------+--------+------------------+           PSV cm/sEDV cm/sStenosisDescribeComments           +----------+--------+--------+--------+--------+------------------+ CCA Prox  109     11                      intimal thickening +----------+--------+--------+--------+--------+------------------+ CCA Distal83      14                      intimal thickening +----------+--------+--------+--------+--------+------------------+ ICA Prox  69      16      1-39%   calcific                   +----------+--------+--------+--------+--------+------------------+ ICA Distal                                tortuous           +----------+--------+--------+--------+--------+------------------+ ECA       86      9                                          +----------+--------+--------+--------+--------+------------------+ +----------+--------+--------+----------------+------------+ SubclavianPSV cm/sEDV cm/sDescribe        Arm Pressure +----------+--------+--------+----------------+------------+           106             Multiphasic, [id number]          +----------+--------+--------+----------------+------------+ +---------+--------+--+--------+--+---------+ VertebralPSV cm/s56EDV cm/s14Antegrade +---------+--------+--+--------+--+---------+  ABI Findings: +---------+------------------+-----+---------+--------+ Right    Rt Pressure  (mmHg)IndexWaveform Comment  +---------+------------------+-----+---------+--------+ Brachial                        triphasicPICC     +---------+------------------+-----+---------+--------+ PTA      164               1.46 biphasic          +---------+------------------+-----+---------+--------+ DP       188               1.68 biphasic          +---------+------------------+-----+---------+--------+ Clinton Gallant                     Normal            +---------+------------------+-----+---------+--------+ +---------+------------------+-----+---------+-------+ Left     Lt Pressure (mmHg)IndexWaveform Comment +---------+------------------+-----+---------+-------+ Brachial 112  triphasic        +---------+------------------+-----+---------+-------+ PTA      117               1.04 biphasic         +---------+------------------+-----+---------+-------+ DP       110               0.98 biphasic         +---------+------------------+-----+---------+-------+ Barry Dienes                     Abnormal         +---------+------------------+-----+---------+-------+  Summary: Right Carotid: Velocities in the right ICA are consistent with a 1-39% stenosis. Left Carotid: Velocities in the left ICA are consistent with a 1-39% stenosis. Vertebrals:  Bilateral vertebral arteries demonstrate antegrade flow. Subclavians: Normal flow hemodynamics were seen in bilateral subclavian              arteries.  *See table(s) above for measurements and observations. Right ABI: Resting right ankle-brachial index indicates noncompressible right lower extremity arteries. The right toe-brachial index is normal. Left ABI: Resting left ankle-brachial index is within normal range. The left toe-brachial index is abnormal.     Preliminary    VAS [place] LOWER EXTREMITY VENOUS (DVT)  Result Date: 03/02/2023  Lower Venous DVT Study Patient Name:  [facility]ARLD MICHALK [facility]  Date of Exam:   03/02/2023  Medical Rec #: [id number]     Accession #:    [id number] Date of Birth: 05/09/1956     Patient Gender: M Patient Age:   24 years Exam Location:  [facility] Procedure:      VAS [place] LOWER EXTREMITY VENOUS (DVT) Referring Phys: Reuel Boom BENSIMHON --------------------------------------------------------------------------------  Indications: Pre-VAD workup.  Comparison Study: No prior studies. Performing Technologist: Jean Rosenthal RDMS, RVT  Examination Guidelines: A complete evaluation includes B-mode imaging, spectral Doppler, color Doppler, and power Doppler as needed of all accessible portions of each vessel. Bilateral testing is considered an integral part of a complete examination. Limited examinations for reoccurring indications may be performed as noted. The reflux portion of the exam is performed with the patient in reverse Trendelenburg.  +---------+---------------+---------+-----------+----------+--------------+ RIGHT    CompressibilityPhasicitySpontaneityPropertiesThrombus Aging +---------+---------------+---------+-----------+----------+--------------+ CFV      Full           Yes      Yes                                 +---------+---------------+---------+-----------+----------+--------------+ SFJ      Full                                                        +---------+---------------+---------+-----------+----------+--------------+ FV Prox  Full                                                        +---------+---------------+---------+-----------+----------+--------------+ FV Mid   Full                                                        +---------+---------------+---------+-----------+----------+--------------+  FV DistalFull           Yes      Yes                                 +---------+---------------+---------+-----------+----------+--------------+ PFV      Full                                                         +---------+---------------+---------+-----------+----------+--------------+ POP      Full           Yes      Yes                                 +---------+---------------+---------+-----------+----------+--------------+ PTV      Full                                                        +---------+---------------+---------+-----------+----------+--------------+ PERO     Full                                                        +---------+---------------+---------+-----------+----------+--------------+   +---------+---------------+---------+-----------+----------+--------------+ LEFT     CompressibilityPhasicitySpontaneityPropertiesThrombus Aging +---------+---------------+---------+-----------+----------+--------------+ CFV      Full           Yes      Yes                                 +---------+---------------+---------+-----------+----------+--------------+ SFJ      Full                                                        +---------+---------------+---------+-----------+----------+--------------+ FV Prox  Full                                                        +---------+---------------+---------+-----------+----------+--------------+ FV Mid   Full                                                        +---------+---------------+---------+-----------+----------+--------------+ FV DistalFull           Yes      Yes                                 +---------+---------------+---------+-----------+----------+--------------+  PFV      Full                                                        +---------+---------------+---------+-----------+----------+--------------+ POP      Full           Yes      Yes                                 +---------+---------------+---------+-----------+----------+--------------+ PTV      Full                                                         +---------+---------------+---------+-----------+----------+--------------+ PERO     Full                                                        +---------+---------------+---------+-----------+----------+--------------+     Summary: RIGHT: - There is no evidence of deep vein thrombosis in the lower extremity.  - No cystic structure found in the popliteal fossa.  LEFT: - There is no evidence of deep vein thrombosis in the lower extremity.  - No cystic structure found in the popliteal fossa.  *See table(s) above for measurements and observations.    Preliminary    CT CHEST ABDOMEN PELVIS WO CONTRAST  Result Date: 03/01/2023 CLINICAL DATA:  Ventricular assist device evaluation. EXAM: CT CHEST, ABDOMEN AND PELVIS WITHOUT CONTRAST TECHNIQUE: Multidetector CT imaging of the chest, abdomen and pelvis was performed following the standard protocol without IV contrast. RADIATION DOSE REDUCTION: This exam was performed according to the departmental dose-optimization program which includes automated exposure control, adjustment of the mA and/or kV according to patient size and/or use of iterative reconstruction technique. COMPARISON:  Chest radiograph 02/26/2023. Abdominopelvic CT 06/17/2021, PET CT 03/31/2021 FINDINGS: CT CHEST FINDINGS Cardiovascular: Left-sided pacemaker with lead tips in the right atrium and right ventricle. The heart is mildly enlarged. Diffuse pericardial calcifications. There are also likely subendocardial calcifications at the left ventricular apex. No significant pericardial effusion. There are coronary artery calcifications. The thoracic aorta is normal in caliber, moderate aortic atherosclerosis. Ascending aorta measures 3.7 cm greatest dimension. Main pulmonary artery is normal at 2.7 cm. Mediastinum/Nodes: No mediastinal adenopathy. Evaluation of the hilar structures is limited in the absence of IV contrast. Lobulated fluid density structure in the right retrocrural region is stable from  prior exam, of doubtful clinical significance. Minimal hiatal hernia. No thyroid nodule. The previous cystic structure inferior to the left thyroid is not definitively seen on this unenhanced exam. Lungs/Pleura: Mild to moderate emphysema. Mild scarring in the medial right lower lobe adjacent to thoracic spine osteophytes. There are hazy ground-glass opacities in both upper lobes. No definite septal thickening. No pleural fluid. Trachea and central airways are clear. No pulmonary mass or dominant nodule. Musculoskeletal: Thoracic spondylosis with anterior spurring. There are no acute or suspicious osseous abnormalities. No chest wall soft tissue abnormalities. CT  ABDOMEN PELVIS FINDINGS Hepatobiliary: No focal liver abnormality is seen. No gallstones, gallbladder wall thickening, or biliary dilatation. Pancreas: Unremarkable unenhanced appearance. Spleen: Peripheral subcapsular calcifications laterally. Normal in size. Adrenals/Urinary Tract: No adrenal nodule. No hydronephrosis or renal calculi. There may be mild bilateral renal parenchymal atrophy. No renal inflammation. Partially distended urinary bladder. Stomach/Bowel: Minimal hiatal hernia. No bowel obstruction or inflammation. Moderate volume of stool in the colon. Few colonic diverticula without diverticulitis. Slight edema in the right lower small bowel mesentery is chronic, and of doubtful clinical significance. Normal appendix. Vascular/Lymphatic: Advanced aortic and branch atherosclerosis. No aortic aneurysm. No bulky abdominopelvic adenopathy in this unenhanced exam. Reproductive: Enlarged prostate gland causing nodular mass effect on the bladder base. Soft tissue density again seen in the right inguinal canal. Other: No ascites. Soft tissue density again seen in the right inguinal canal. Diminutive fat containing umbilical hernia. Musculoskeletal: Mild lumbar degenerative change. There are no acute or suspicious osseous abnormalities. IMPRESSION: 1.  Mild cardiomegaly. Diffuse pericardial calcifications. There are also likely subendocardial calcifications at the left ventricular apex. 2. Hazy ground-glass opacities in the upper lobes, may represent pulmonary edema in the appropriate clinical setting. 3. No acute abnormality in the abdomen/pelvis. 4. Enlarged prostate gland causing nodular mass effect on the bladder base. 5. Colonic diverticulosis without diverticulitis. 6. Soft tissue density again seen in the right inguinal canal, may represent a spermatic cord hernia. This is stable from prior exams. 7. The previous cystic structure inferior to the left thyroid lobe is not definitively seen on this unenhanced exam. Emphysema (ICD10-J43.9).  Aortic Atherosclerosis (ICD10-I70.0). Electronically Signed   By: Narda Rutherford M.D.   On: 03/01/2023 16:06   DG Orthopantogram  Result Date: 03/01/2023 CLINICAL DATA:  Preoperative evaluation to rule out surgical contraindications. No symptoms. EXAM: ORTHOPANTOGRAM/PANORAMIC COMPARISON:  None Available. FINDINGS: Unremarkable teeth with no cavities or periapical abscesses seen. IMPRESSION: Negative. Electronically Signed   By: Beckie Salts M.D.   On: 03/01/2023 15:46   [place] EKG SITE RITE  Result Date: 03/01/2023 If Site Rite image not attached, placement could not be confirmed due to current cardiac rhythm.  CARDIAC CATHETERIZATION  Result Date: 02/28/2023 Successful right heart catheterization via the right antecubital vein. Normal RA pressure, mild pulmonary hypertension, mildly elevated wedge pressure and moderately reduced cardiac output/index. RA: 5 mmHg RV 43/3 with an RVEDP of 8 mmHg PW: 26/37 with a mean of 19 mmHg.  Prominent V wave. PA: 44/13 with a mean of 27 mmHg. PA sat is 67% with cardiac output of 3.95 and cardiac index of 2.15. 2.  Severe right-sided pericardial calcifications of unclear etiology.  No evidence of constriction by hemodynamic evaluation. Recommendations: Recommend gentle diuresis.  Anticoagulation can be resumed. Consider advanced heart failure consult.      Scheduled Meds:  amiodarone  200 mg Oral Daily   atorvastatin  80 mg Oral Daily   Chlorhexidine Gluconate Cloth  6 each Topical Daily   dapagliflozin propanediol  10 mg Oral Daily   sodium chloride flush  10-40 mL Intracatheter Q12H   tamsulosin  0.4 mg Oral QPC supper   Continuous Infusions:  heparin 1,200 Units/hr (03/02/23 0226)   milrinone 0.125 mcg/kg/min (03/02/23 0043)     LOS: 4 days   Time spent: 20 minutes   Noralee Stain, DO [facility] 03/02/2023, 12:15 PM   Available via Epic secure chat 7am-7pm After these hours, please refer to coverage provider listed on [email or web address]

## 2023-03-02 NOTE — Progress Notes (Signed)
Daily Progress Note   Patient Name: Steven [name]       Date: 03/02/2023 DOB: [birth year]  Age: 67 y.o. MRN#: [id number] Attending Physician: Noralee Stain, DO Primary Care Physician: [name], MD Admit Date: 02/26/2023  Reason for Consultation/Follow-up: Establishing goals of care  Subjective: Medical records reviewed including progress notes, labs, imaging. Patient assessed at the bedside.  He is comfortable, eating his lunch.  His daughter is present visiting.  Discussed with Dr. Gala Romney.  Created space and opportunity for patient's thoughts and feelings on his current illness.  He does not have any further questions or concerns after our conversation yesterday.    Explored his thoughts on his care preferences and any limitations he might have in the future, encouraging him to continue conversations with his daughter.  He continues to have reservations about prolonged dependency on artificial life support, such as mechanical ventilation or artificial nutrition.  He states that he would never want to be confined to his bed or unable to care for himself for a prolonged period.  If he is unable to improve after a trial of aggressive interventions, he would prefer to de-escalate his care and pass peacefully.  His daughter becomes emotional hearing this emotional support therapeutic listening was provided.  She understands the high risk involved with the procedure such as an LVAD placement in the benefit of preparing for the worst while hoping for the best.  Patient remains interested in completing advance directives during his hospitalization. Daughter Steven [name] is agreeable to serving as HCPOA discussed the importance of honoring patient's wishes if he is unable to tell us himself.  Questions and  concerns addressed. PMT will continue to support holistically.   Length of Stay: 4   Physical Exam Vitals and nursing note reviewed.  Constitutional:      General: He is not in acute distress. Cardiovascular:     Rate and Rhythm: Normal rate.  Pulmonary:     Effort: Pulmonary effort is normal.  Neurological:     Mental Status: He is alert and oriented to person, place, and time.  Psychiatric:        Mood and Affect: Mood normal.        Behavior: Behavior normal.            Vital Signs: BP 105/69 (BP  Location: Left Arm)   Pulse 60   Temp 97.6 F (36.4 C) (Oral)   Resp 18   Ht 5\' 9"  (1.753 m)   Wt 67.6 kg   SpO2 99%   BMI 22.01 kg/m  SpO2: SpO2: 99 % O2 Device: O2 Device: Room Air O2 Flow Rate: O2 Flow Rate (L/min): 2 L/min      Palliative Assessment/Data:    Palliative Care Assessment & Plan   Patient Profile: 67 y.o. male  with past medical history of ischemic cardiomyopathy, systolic heart failure with a EF of less than 20%, LV thrombus, ventricular tachycardia s/p ICD, hypertension, CKD stage 3b, hyperlipidemia and hypertension  admitted on 02/26/2023 with dyspnea.    Patient was recently admitted for heart failure exacerbation from 4/21 to 4/24.  He is now admitted again with another acute heart failure exacerbation, acute hypoxic/hypercapnic respiratory failure secondary to cardiogenic pulmonary edema, AKI on CKD 3.  Advanced heart failure team is beginning workup for possible LVAD. PMT has been consulted to assist with LVAD evaluation, goals of care conversation.  Assessment: Goals of care conversation Acute hypoxic respiratory failure Acute on chronic systolic CHF with EF less than 20% Pulmonary edema AKI on CKD 3B  Recommendations/Plan: Continue full code/full scope treatment Patient remains interested in LVAD Patient would never want to be dependent on artificial life support i.e. mechanical ventilation, artificial nutrition, for prolonged period Patient  would not find it acceptable if he were bedbound or unable to care for himself indefinitely Attempt to complete advanced directives during this admission.  Goals of care document completed in [facility] Psychosocial and emotional support provided PMT will continue to follow and support intermittently   Prognosis:  Unable to determine  Discharge Planning: To Be Determined  Care plan was discussed with patient, patient's daughter, MD   MDM high         Dinna Severs Jeni Salles, PA-C  Palliative Medicine Team Team phone # [phone]  Thank you for allowing the Palliative Medicine Team to assist in the care of this patient. Please utilize secure chat with additional questions, if there is no response within 30 minutes please call the above phone number.  Palliative Medicine Team providers are available by phone from 7am to 7pm daily and can be reached through the team cell phone.  Should this patient require assistance outside of these hours, please call the patient's attending physician.

## 2023-03-02 NOTE — TOC Progression Note (Signed)
Transition of Care [facility]) - Progression Note    Patient Details  Name: Steven [name] MRN: [id number] Date of Birth: June 16, 1956  Transition of Care [facility]) CM/SW Contact  Nicanor Bake Phone Number: [phone] 03/02/2023, 2:24 PM  Clinical Narrative:   [facility] CSW called the pts PCP to schedule a follow up hospital appointment. Pts PCP doctor  has moved to a different location and has a different phone number. Which is [phone]. CSW called and left a VM asking for Toniann Fail to call back to schedule pts appointment. TOC will continue following.     Expected Discharge Plan: Home/Self Care Barriers to Discharge: Continued Medical Work up  Expected Discharge Plan and Services       Living arrangements for the past 2 months: Single Family Home                                       Social Determinants of Health (SDOH) Interventions SDOH Screenings   Food Insecurity: No Food Insecurity (02/26/2023)  Housing: High Risk (02/26/2023)  Transportation Needs: No Transportation Needs (02/26/2023)  Utilities: Not At Risk (02/26/2023)  Alcohol Screen: Low Risk  (05/08/2022)  Financial Resource Strain: Low Risk  (05/08/2022)  Social Connections: Unknown (11/08/2021)   Received from [facility]  Tobacco Use: Medium Risk (02/26/2023)    Readmission Risk Interventions     No data to display

## 2023-03-02 NOTE — Progress Notes (Signed)
LVAD Initial Psychosocial Screening  Date/Time Initiated:  12pm Referral Source:  Heart Failure Team Referral Reason:  LVAD psychosocial screening Source of Information:  patient and patient daughter, Steven [name]  Demographics Name:  Steven [name] Address:  [address], [place], [place] [address] Home phone:  [phone] (home)    Cell: [phone] Marital Status: Single  Faith:  Christian Primary Language:  English SS last 4: [id number]  DOB: Dec 31, 1955   Medical & Follow-up Adherence to Medical regimen/INR checks: compliant  Medication adherence: compliant  Physician/Clinic Appointment Attendance: compliant Comments: Patient reports adherence to all of these.  Did notice a 15% no show rate but did not have any no shows in recent months.   Advance Directives: Do you have a Living Will or Medical POA? No  Would you like to complete a Living Will and Medical POA prior to surgery?  Yes Do you have Goals of Care? No  Have you had a consult with the Palliative Care Team at [facility]? No- pt thought he had but wasn't sure- don't see any notes from palliative team in the chart  Psychological Health Appearance:  Unremarkable and In hospital gown Mental Status:  Alert, oriented Eye Contact:  Good Thought Content:  Coherent Speech:  Unremarkable Mood:  Appropriate  Affect:  Appropriate to circumstance Insight:  Fair Judgement: Unimpaired Interaction Style:  Engaged  Family/Social Information Who lives in your home? Name:   Relationship:    Other family members/support persons in your life? Name:   Relationship:    Caregiving Needs Who is the primary caregiver? Steven [name] Health status:  Good Do you drive?  Yes Do you work?  No- hasn't worked in some time Physical Limitations:  none Do you have other care giving responsibilities?  no Contact number: [phone]  Who is the secondary caregiver? Steven [name] Contact number:[phone]  Home  Environment/Personal Care Do you have reliable phone service? Yes  If so, what is the number?  [phone]- also has landline Do you own or rent your home? own Current mortgage/rent: $1,018 Number of steps into the home? 0 How many levels in the home? 2- all bedrooms are upstairs Assistive devices in the home? none Electrical needs for LVAD (3 prong outlets)? Yes has 3 pronged outlets Second hand smoke exposure in the home? no Travel distance from [facility]? 15 minutes  Community Are you active with community agencies/resources/homecare? No Agency Name:  Are you active in a church, synagogue, mosque or other faith based community? No  What other sources do you have for spiritual support? no Are you active in any clubs or social organizations? Is part of the [address] of [place] and [employer] [place]the region [place] is from[place] What do you do for fun?  Hobbies?  Interests? Likes to listen to music, read, socialize with friends  Education/Work Information What is the last grade of school you completed? undergraduate Preferred method of learning?  Written Do you have any problems with reading or writing?  No Are you currently employed?  Yes  When were you last employed? Prior to hospital stay  Name of employer? [employer]  Please describe the kind of work you do? 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 Feb 2024   Financial Information What is your source of income? Receives Social Security income of about $700/month- states it is supposed to be $900 something but he was told he was overpaid  by disability so now they are deducting from his check until he is caught up. Also receives $300 from a [employer] each month to pay for utilites and food. Do you have difficulty meeting your monthly expenses? No- not while he has been able to work but has concerns about how he will pay if he is out of work for several months as his  social security check would not even cover his mortgage.   How do you cope with this? Is not sure how he will handle this.  Has sent a message to his supervisor to inquire if he can get short term disability from work.  Doesn't think his daughter can help financially. Can you budget for the monthly cost for dressing supplies post procedure? Yes  Primary Health insurance:  [employer] Secondary Insurance: Medicaid Prescription plan: United Healthcare/Medicaid What are your prescription co-pays? $4 Do you use mail order for your prescriptions?  Yes Have you ever had to refuse medication due to cost?  No Have you applied for Medicaid?  Already receiving Have you applied for Social Security Disability (SSI)  already receiving  Medical Information Briefly describe why you are here for evaluation: States that his heart is not pumping right and he needs this device to help correct this. Do you have a PCP or other medical provider? Steven [name] Are you able to complete your ADL's?  yes Do you have a history of trauma, physical, emotional, or sexual abuse? no Do you have any family history of heart problems? Not that he is aware of but there was not a good medical history kept in [place]. Do you smoke now or past usage? past usage    Quit date:2012 Do you drink alcohol now or past usage? Yes   - reports social drinking on weekends maybe 2-3 drinks a night Are you currently using illegal drugs or misuse of medication or past usage? past usage -very slight marijuana use a long time ago Have you ever been treated for substance abuse? No      If yes, where and when did you receive treatment? N/a  Mental Health History How have you been feeling in the past year? Has been struggling somewhat due to losing his previous job and then struggling to find consistent work. Have you ever had any problems with depression, anxiety or other mental health issues? Not outside the context of a stressful  situation.  Admits to feeling somewhat depressed and anxious about his previous job concerns and about current health but states he has never struggled with these things long term Do you see a counselor, psychiatrist or therapist?  no If you are currently experiencing problems are you interested in talking with a professional? No Have you or are you taking medications for anxiety/depression or any mental health concerns?  No  What are your coping strategies under stressful situations? Sometimes will isolate himself so he can plan how to overcome the stressors.  Will also go to a park or take a walk to make himself feel better. Are there any other stressors in your life?  no Have you had any past or current thoughts of suicide? Has had very brief thoughts of wanting to harm himself while he was unemployed but those passed quickly. How many hours do you sleep at night? 8-9 How is your appetite? normal Would you be interested in attending the LVAD support group? yes  PHQ2 Depression Scale: 2  Legal Do you currently have any legal issues/problems?  no Have you had any legal issues/problems in the past?  no Do you have a Durable POA?  no  Plan for VAD Implementation Do you know and understand what happens during the VAD surgery? Patient Verbalizes Understanding  of surgery and able to describe details What do you know about the risks and side effect associated with VAD surgery? Patient Verbalizes Understanding  of risks (infection, stroke and death) Explain what will happen right after surgery: Patient Verbalizes Understanding  of OR to ICU and will be intubated What is your plan for transportation for the first 8 weeks post-surgery? (Patients are not recommended to drive post-surgery for 8 weeks)  Driver: Steven [name] Cupo Do you have airbags in your vehicle?  There is a risk of discharging the device if the airbag were to deploy. Understands risk of deployment and need to avoid sitting in front What  do you know about your diet post-surgery? Patient Verbalizes Understanding  of Heart healthy How do you plan to monitor your medications, current and future?  Continuing to be diligent about taking medications as prescribed.  How do you plan to complete ADL's post-surgery?  Hopefully independently but with help from caregiver if necessary Will it be difficult to ask for help from your caregivers?  Sometimes can be stubborn about wanting to do things himself but understands the importance of asking for help when needed.  Please explain what you hope will be improved about your life as a result of receiving the LVAD? Hopeful that his qualify of life and general feelings of health with improve- has been hard to do things given fatigue. Please tell me your biggest concern or fear about living with the LVAD?  Is concerned about how it will look to people to have the batteries that he has to carry around.  How do you cope with your concerns and fears?  Understands that if this procedure is necessary that he won't have a choice what it looks like so he might as well accept it. Please explain your understanding of how their body will change?  He will have to carry around batteries and will have a driveline coming out of his abdomen. Are you worried about these changes? Is not happy about having these changes but understands they are necessary to save his life. Do you see any barriers to your surgery or follow-up? no  Understanding of LVAD Patient states understanding of the following: Surgical procedures and risks, Electrical need for LVAD (3 prong outlets), Safety precautions with LVAD (water, etc.), LVAD daily self-care (dressing changes, computer check, extra supplies), Outpatient follow up (LVAD clinic appts, monitoring blood thinners), and Need for Emergency Planning  Discussed and Reviewed with Patient and Caregiver  Patient's current level of motivation to prepare for LVAD:  Patient's present Level  of Consent for LVAD:     Education provided to patient/family/caregiver:   Caregiver role and responsibiltiy, Financial planning for LVAD, Role of Clinical Social Worker, and Signs of Depression and Anxiety    Discussed and Reviewed with Patient and Caregiver  Caregiver questions Please explain what you hope will be improved about your life and loved one's life as a result of receiving the LVAD?  Hoping that his quality of life would improve. What is your biggest concern or fear about caregiving with an LVAD patient?  Doesn't have any for her job as caregiver.  Hopeful he will follow up as he needs to. What is your plan for availability to provide care 24/7 x2 weeks  post op and dressing changes ongoing?  Steven [name] has not worked in [employer] and has no need to return to work so she will plan to remain with him staying at his house for as long as she needs to assist him. Who is the relief/backup caregiver and what is their availability?  Steven [name]- did not assess her availability at time of this assessment. Preferred method of learning? Written, Verbal, and Hands on  Do you drive? yes How do you handle stressful situations?  Has a high tolerance for stress and just works through it. Do you think you can do this? yes Is there anything that concerns about caregiving?  none Do you provide caregiving to anyone else?  no  Caregiver's current level of motivation to prepare for LVAD: 10 out of 10 Caregiver's present level of consent for LVAD: 10 out of 10  Clinical Interventions Needed:     CSW will monitor signs and symptoms of depression and assist with adjustment to life with an LVAD. CSW will refer patient for HPOA if not completed prior to surgery if still wishing to complete. CSW encouraged attendance with the LVAD Support Group to assist further with adjustment and post implant peer support.   Clinical Impressions/Recommendations:    Mr. Jaggard is a 67yo male who lives alone and has the  support of his only child, Steven [name] Heemstra, and his ex-[name], Steven [name].  Mr. Hunsaker was born in [place] but moved to the [place] in the 80's and has been a citizen since the late 90's.  Other than his daughter and his ex-[name] he reports some support from friends and he is involved in two local [place] clubs.  During our interview Mr. Pascarella was alert and oriented and answered questions appropriately throughout.  Mr. Pasqualone completed an undergraduate degree after coming to the states and reports no concerns reading or writing.  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  He owns his home but continues to pay a mortgage of $1018 a month.  He gets about $2,400/month from employment and he also receives disability income of around $49- reports he is supposed to get somewhere in the $900s but disability told him he was overpaid at somepoint so his check has been lowered till that is paid off.  He reports no problems being able to pay for basic needs at this time but is very concerned what will happen when he is unable to work for several months- is awaiting word from his employer about STD benefits. Reports being compliant with medication management and appointment attendance.  He has [facility] Medicare Dual Complete which covers his medications and medical bills well.  He reports his hobbies are listening to music, reading, and socializing with friends.  Mr. Magnifico admits to having some struggles with feelings of depression due to stressors within the last year.  He was laid off from his job last year and struggled to find sufficient employment which made him feel very down.  Has been in a better mind set since starting his current job in February of this year.  He scored a 2 on his PHQ-2 because he reported having little enjoyment in things or being motivated- though he attributes most of this to his  fatigue which made it difficult to be motivated to do things.  When he feels down or stressed he isolates himself and then tries  to come up with a plan on how to move forward.  He practices self care by taking a walk or going to a park- reports enjoying going to the [place]. He reports no history of mental health diagnoses/treatment/medications. He feels as if he would enjoy the LVAD support group and would be interested in joining.  Mr. Avanessian was able to very generally discuss the LVAD procedure and subsequent hospital stay though required lots of prompting.  His main concern with getting the procedure done is having all of the equipment so visible- sounded as if he does not like the idea that people would look at him differently.  His goal of having LVAD surgery would be to stop feeling so fatigued and be able to return to work.   Pt daughter, Steven [name], was also in attendance for the interview and will be the patients primary caregiver if he gets the LVAD.  Joria lives in the area and is unemployed so is available to assist the patient 24hours a day for 2 weeks as well as what is needed following that.  She reports she has no health conditions or physical limitations as well as no other responsibilities that would take her away from the patient during this time.  She has reliable transportation and has no barriers to taking patient to and from appointments.  Mr. Sunada ex-[name] and Gwyndolyn Kaufman mother has been identified as the secondary caregiver if Steven [name] is unable to complete all the tasks.   The patient and his caregiver have a good understanding of the risks and benefits of the LVAD procedure.  Both patient and caregiver had a high level of motivation to get the surgery if he is approved.  There are no other concerns identified by the pt and caregiver at this time.   Burna Sis, LCSW Clinical Social Worker [facility] Desk#: [phone] Cell#: [phone]

## 2023-03-02 NOTE — Consult Note (Signed)
ANTICOAGULATION CONSULT NOTE - Follow-up  Pharmacy Consult for heparin Indication: apical mural thrombus  No Known Allergies  Patient Measurements: Height: 5\' 9"  (175.3 cm) Weight: 67.6 kg (149 lb 0.5 oz) IBW/kg (Calculated) : 70.7 Heparin Dosing Weight: ~72 kg  Vital Signs: Temp: 97.6 F (36.4 C) (09/06 0817) Temp Source: Oral (09/06 0817) BP: 105/69 (09/06 0817) Pulse Rate: 60 (09/06 0817)  Labs: Recent Labs    02/28/23 0325 02/28/23 1322 02/28/23 1323 03/01/23 0322 03/01/23 1720 03/02/23 0500 03/02/23 0625  HGB 13.4 14.3 14.3  --   --   --   --   HCT 40.5 42.0 42.0  --   --   --   --   PLT 141*  --   --   --   --   --   --   APTT  --   --   --   --   --   --  104*  LABPROT 26.9*  --   --  20.1*  --   --   --   INR 2.5*  --   --  1.7*  --   --   --   HEPARINUNFRC  --   --   --   --  0.35 0.40  --   CREATININE 1.93*  --   --  1.95*  --   --  1.64*    Estimated Creatinine Clearance: 41.2 mL/min (A) (by C-G formula based on SCr of 1.64 mg/dL (H)).  Medical History: Past Medical History:  Diagnosis Date   Acute MI, anterior wall (HCC)    AICD (automatic cardioverter/defibrillator) present    CAD (coronary artery disease)    2D ECHO, 07/13/2011 - EF <25%, LV moderatelty dilated, LA moderately dilatedLEXISCAN, 12/14/2011 - moderate-severe perfusion defect seen in the basal anteroseptal, mid anterior, apicacl anterior, apical, apical inferior, and apical lateral regions, post-stress EF 25%, new EKG changes from baseline abnormalities   [facility]cer [facility])    Prostate   CHF (congestive heart failure) (HCC) 2012   Hypertension 08/08/2021   Inguinal hernia, left    Pneumonia    November 2023   Pre-diabetes     Assessment: 67 yo male with history of HFrEF, CAD, VT, 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  Warfarin started on admission and held for procedures (last dose 9/2).  Pharmacy consulted to dose  heparin.  Warfarin therapy held at this admission with anticipation of future procedures. Patient will continue on heparin infusion for now.  Heparin level is 0.40 with AM labs (0500) on UFH IV infusion at 1200 units/hour. CBC stable. No s/sx of bleeding/bruising.   Goal of Therapy:  INR 2-3 Heparin level 0.3-0.7 units/ml Monitor platelets by anticoagulation protocol: Yes   Plan:  Continue heparin infusion 1200 units/hr Recheck next level with AM labs Daily CBC, heparin level Monitor for s/sx of bleeding  Wilmer Floor, PharmD PGY2 Cardiology Pharmacy Resident  Please refer to [facility] for [facility] Pharmacy numbers 03/02/2023  10:32 AM

## 2023-03-02 NOTE — Progress Notes (Signed)
Initial Nutrition Assessment  DOCUMENTATION CODES:   Not applicable  INTERVENTION:  Chocolate Ensure Max po BID, each supplement provides 150 kcal and 30 grams of protein.   Continue Heart Healthy Diet   Provided diet edu on HH Diet and Sodium Free Flavor tips handouts   MVI  NUTRITION DIAGNOSIS:   Increased nutrient needs related to chronic illness as evidenced by estimated needs.  GOAL:   Patient will meet greater than or equal to 90% of their needs  MONITOR:   PO intake, Supplement acceptance, Weight trends, Skin, I & O's  REASON FOR ASSESSMENT:   Consult LVAD Eval  ASSESSMENT:   67 y.o.male with PMHx including ischemic cardiomyopathy, systolic heart failure, LV thrombus, hx of ventricular tachycardia s/p ICD, HTN, CKD 3, HLD who presents with acute onset of dyspnea and nausea.  S/p R Heart Cath 9/4  Per MD note- PTA, patient admitted to medical compliance but had started to eat although it was clarified to be low sodium   Visited patient at bedside who reports good appetite and that he has been cleaning his plate at every meal. Patient reports he normally cooks at home and makes a large pot of soup with [place] and fufu (West african pounded meal normally made of plantain, cassava or yams) on the side. He reports only adding a teaspoon of salt to the entire pot for the week and that he does not add salt to his food when eating. Patient reports he uses [employer] (MSG) when he cooks sometimes to reduce salt intake. Patient reports he eats out rarely but when he does it is a tuna or [place] sandwich from [employer] or [employer].   RD provided heart healthy diet education and sodium free flavor tips for patient to follow. Patient denies actual weight loss and has diuresed 9# in the past 4 days. He is agreeable to supplementing protein prior to surgery.   He denies N/V/D/C, trouble chewing/swallowing.   Labs: Na 132, Glu 115, BUN 34, Cr 1.64 Meds: pacerone, lipitor, farxiga,  lasix, NS, flomax  Continuous drips: heparin, primacor   Wt:  03/02/23 67.6 kg  02/01/23 71.2 kg  01/01/23 73.2 kg  12/05/22 70 kg  11/21/22 69.9 kg  10/18/22 70.2 kg  10/12/22 78.9 kg  07/10/22 75.3 kg  05/30/22 73.7 kg   PO: 100% x last 3 documented meals   I/O's: -9 L (net cumulative)    NUTRITION - FOCUSED PHYSICAL EXAM:  Flowsheet Row Most Recent Value  Orbital Region No depletion  Upper Arm Region No depletion  Thoracic and Lumbar Region Unable to assess  Buccal Region No depletion  Temple Region No depletion  Clavicle Bone Region No depletion  Clavicle and Acromion Bone Region No depletion  Scapular Bone Region Unable to assess  Dorsal Hand No depletion  Patellar Region No depletion  Anterior Thigh Region No depletion  Posterior Calf Region No depletion  Edema (RD Assessment) None  Hair Reviewed  Eyes Reviewed  Mouth Reviewed  Skin Reviewed  Nails Reviewed        Diet Order:   Diet Order             Diet Heart Room service appropriate? Yes; Fluid consistency: Thin  Diet effective now                   EDUCATION NEEDS:   Education needs have been addressed  Skin:  Skin Assessment: Reviewed RN Assessment  Last BM:  9/5  Height:  Ht Readings from Last 1 Encounters:  02/26/23 5\' 9"  (1.753 m)    Weight:   Wt Readings from Last 1 Encounters:  03/02/23 67.6 kg    Ideal Body Weight:     BMI:  Body mass index is 22.01 kg/m.  Estimated Nutritional Needs:   Kcal:  1700-2040  Protein:  80-100 g  Fluid:  >/= 2 L    Leodis Rains, RDN, LDN  Clinical Nutrition

## 2023-03-02 NOTE — Progress Notes (Signed)
   03/01/23 2000  BiPAP/CPAP/SIPAP  BiPAP/CPAP/SIPAP Pt Type Adult  Reason BIPAP/CPAP not in use Other(comment) (prn order, not indicated at this time, pt not in any distress)  MEWS Score/Color  MEWS Score 1  MEWS Score Color [place]

## 2023-03-03 DIAGNOSIS — I5023 Acute on chronic systolic (congestive) heart failure: Secondary | ICD-10-CM | POA: Diagnosis not present

## 2023-03-03 LAB — CBC
HCT: 42.7 % (ref 39.0–52.0)
Hemoglobin: 14.5 g/dL (ref 13.0–17.0)
MCH: 30.9 pg (ref 26.0–34.0)
MCHC: 34 g/dL (ref 30.0–36.0)
MCV: 90.9 fL (ref 80.0–100.0)
Platelets: 176 10*3/uL (ref 150–400)
RBC: 4.7 MIL/uL (ref 4.22–5.81)
RDW: 13 % (ref 11.5–15.5)
WBC: 7.5 10*3/uL (ref 4.0–10.5)
nRBC: 0 % (ref 0.0–0.2)

## 2023-03-03 LAB — BASIC METABOLIC PANEL
Anion gap: 14 (ref 5–15)
BUN: 37 mg/dL — ABNORMAL HIGH (ref 8–23)
CO2: 24 mmol/L (ref 22–32)
Calcium: 9.4 mg/dL (ref 8.9–10.3)
Chloride: 94 mmol/L — ABNORMAL LOW (ref 98–111)
Creatinine, Ser: 1.87 mg/dL — ABNORMAL HIGH (ref 0.61–1.24)
GFR, Estimated: 39 mL/min — ABNORMAL LOW (ref >60)
Glucose, Bld: 121 mg/dL — ABNORMAL HIGH (ref 70–99)
Potassium: 4.3 mmol/L (ref 3.5–5.1)
Sodium: 132 mmol/L — ABNORMAL LOW (ref 135–145)

## 2023-03-03 LAB — HEPARIN LEVEL (UNFRACTIONATED): Heparin Unfractionated: 0.59 [IU]/mL (ref 0.30–0.70)

## 2023-03-03 LAB — COOXEMETRY PANEL
Carboxyhemoglobin: 1 % (ref 0.5–1.5)
Methemoglobin: <0.7 % (ref 0.0–1.5)
O2 Saturation: 61.9 %
Total hemoglobin: 15.1 g/dL (ref 12.0–16.0)

## 2023-03-03 LAB — LIPOPROTEIN A (LPA): Lipoprotein (a): 75.6 nmol/L — ABNORMAL HIGH (ref <75.0)

## 2023-03-03 LAB — HEPATITIS B SURFACE ANTIBODY, QUANTITATIVE: Hep B S AB Quant (Post): 5.1 m[IU]/mL — ABNORMAL LOW

## 2023-03-03 LAB — MAGNESIUM: Magnesium: 2.5 mg/dL — ABNORMAL HIGH (ref 1.7–2.4)

## 2023-03-03 NOTE — Evaluation (Signed)
Physical Therapy Evaluation Patient Details Name: Steven [name] MRN: [id number] DOB: 1956-04-06 Today's Date: 03/03/2023  History of Present Illness  Pt is 67 yo male admitted on 02/26/23 for acute on chronic systolic and diastolic CHF with low output.  Pt with workup for LVAD.  Hx includes but not limited to ICM, CAD, VT s/p medtronic ICD, HLD, apical mural thrombus, CKD, subdural hematoma  Clinical Impression  Pt admitted with above diagnosis.  At baseline , he is independent and lives alone.  Pt has been mobilizing in room and on unit independently.  He performed 6 min walk test with therapy per orders walking 930' safely and without shortness of breath. He demonstrates good balance and strength.  Did start education on sternal precautions and having him practice transfers without using UE to prep for post LVAD.  Pt was easily able to transfer without arms when cued.  Pt does not have further acute PT needs at this time, please reconsult when/if needed post LVAD.       6 Min Walk Test: Pre: HR 67 bpm, Sats 100%, BP 104/67 Post: HR 88, Sats 100%, BP 128/87 Walked 930' on RA    If plan is discharge home, recommend the following:     Can travel by private vehicle        Equipment Recommendations None recommended by PT  Recommendations for Other Services       Functional Status Assessment Patient has not had a recent decline in their functional status     Precautions / Restrictions Precautions Precautions: None      Mobility  Bed Mobility Overal bed mobility: Independent Bed Mobility: Supine to Sit, Sit to Supine     Supine to sit: Independent Sit to supine: Independent   General bed mobility comments: Educated to begin practicing sternal precautions.  Pt automatically pushes with UE to scoot forward but with cues was able to weight shift without UE to scoot.  Pt reports will be difficult to remember - discussed we would continue to remind him , and after surgery would have  soreness/pain that would remind him also.    Transfers Overall transfer level: Independent Equipment used: None               General transfer comment: Has been mobilizing in room on his own. Educated on sternal precautions for after surgery - pt was able to stand easily without use of UE    Ambulation/Gait Ambulation/Gait assistance: Independent Gait Distance (Feet): 930 Feet Assistive device: None Gait Pattern/deviations: WFL(Within Functional Limits) Gait velocity: normal     General Gait Details: Pt has been ambulating in room and on unit independently. Performed 6 MWT per orders with pt walking 930'.  Stairs            Wheelchair Mobility     Tilt Bed    Modified Rankin (Stroke Patients Only)       Balance Overall balance assessment: Needs assistance Sitting-balance support: No upper extremity supported Sitting balance-Leahy Scale: Normal     Standing balance support: No upper extremity supported Standing balance-Leahy Scale: Normal                               Pertinent Vitals/Pain Pain Assessment Pain Assessment: No/denies pain    Home Living Family/patient expects to be discharged to:: Private residence Living Arrangements: Alone Available Help at Discharge: Other (Comment) (reports can arrange for assist after sx) Type  of Home: House Home Access: Level entry     Alternate Level Stairs-Number of Steps: flight Home Layout: Two level;Bed/bath upstairs Home Equipment: None      Prior Function Prior Level of Function : Independent/Modified Independent;Driving                     Extremity/Trunk Assessment   Upper Extremity Assessment Upper Extremity Assessment: Overall WFL for tasks assessed    Lower Extremity Assessment Lower Extremity Assessment: Overall WFL for tasks assessed    Cervical / Trunk Assessment Cervical / Trunk Assessment: Normal  Communication      Cognition Arousal: Alert Behavior During  Therapy: WFL for tasks assessed/performed Overall Cognitive Status: Within Functional Limits for tasks assessed                                          General Comments      Exercises     Assessment/Plan    PT Assessment Patient does not need any further PT services  PT Problem List         PT Treatment Interventions      PT Goals (Current goals can be found in the Care Plan section)  Acute Rehab PT Goals Patient Stated Goal: not stated PT Goal Formulation: All assessment and education complete, DC therapy    Frequency       Co-evaluation               AM-PAC PT "6 Clicks" Mobility  Outcome Measure Help needed turning from your back to your side while in a flat bed without using bedrails?: None Help needed moving from lying on your back to sitting on the side of a flat bed without using bedrails?: None Help needed moving to and from a bed to a chair (including a wheelchair)?: None Help needed standing up from a chair using your arms (e.g., wheelchair or bedside chair)?: None Help needed to walk in hospital room?: None Help needed climbing 3-5 steps with a railing? : A Little 6 Click Score: 23    End of Session   Activity Tolerance: Patient tolerated treatment well Patient left: in bed;with call bell/phone within reach Nurse Communication: Mobility status PT Visit Diagnosis: Other abnormalities of gait and mobility (R26.89)    Time: 5409-8119 PT Time Calculation (min) (ACUTE ONLY): 20 min   Charges:   PT Evaluation $PT Eval Low Complexity: 1 Low   PT General Charges $$ ACUTE PT VISIT: 1 Visit         Anise Salvo, PT Acute Rehab Services [facility] [phone]   Rayetta Humphrey 03/03/2023, 4:58 PM

## 2023-03-03 NOTE — Progress Notes (Signed)
Advanced Heart Failure Rounding Note  PCP-Cardiologist: Nicki Guadalajara, MD   Subjective:    CO-OX 62% on milrinone 0.125. CVP 2-3  Scr 1.6 -> 1.9  Denies CP or SOB. Worried about surgery   Objective:   Weight Range: 66.9 kg Body mass index is 21.78 kg/m.   Vital Signs:   Temp:  [97.6 F (36.4 C)-98.3 F (36.8 C)] 98.3 F (36.8 C) (09/07 0754) Pulse Rate:  [61-66] 65 (09/07 0754) Resp:  [16-18] 18 (09/07 0755) BP: (94-110)/(60-72) 107/71 (09/07 0755) SpO2:  [96 %-100 %] 96 % (09/07 0755) Weight:  [66.9 kg] 66.9 kg (09/07 0432) Last BM Date : 03/01/23  Weight change: Filed Weights   03/01/23 0424 03/02/23 0320 03/03/23 0432  Weight: 67.3 kg 67.6 kg 66.9 kg    Intake/Output:   Intake/Output Summary (Last 24 hours) at 03/03/2023 1152 Last data filed at 03/03/2023 0840 Gross per 24 hour  Intake 755.73 ml  Output 3975 ml  Net -3219.27 ml      Physical Exam    General:  Thin male. HEENT: normal Neck: supple. no JVD. Carotids 2+ bilat; no bruits. No lymphadenopathy or thryomegaly appreciated. Cor: PMI nondisplaced. Regular rate & rhythm.+s3 Lungs: clear Abdomen: soft, nontender, nondistended. No hepatosplenomegaly. No bruits or masses. Good bowel sounds. Extremities: no cyanosis, clubbing, rash, edema Neuro: alert & orientedx3, cranial nerves grossly intact. moves all 4 extremities w/o difficulty. Affect pleasant   Telemetry   SR 60s, intermittently Apaced Personally reviewed  Labs    CBC Recent Labs    02/28/23 1323 03/03/23 0454  WBC  --  7.5  HGB 14.3 14.5  HCT 42.0 42.7  MCV  --  90.9  PLT  --  176   Basic Metabolic Panel Recent Labs    09/81/19 0625 03/03/23 0454  NA 132* 132*  K 3.9 4.3  CL 98 94*  CO2 22 24  GLUCOSE 115* 121*  BUN 34* 37*  CREATININE 1.64* 1.87*  CALCIUM 8.7* 9.4  MG 2.3 2.5*   Liver Function Tests No results for input(s): "AST", "ALT", "ALKPHOS", "BILITOT", "PROT", "ALBUMIN" in the last 72 hours.  No  results for input(s): "LIPASE", "AMYLASE" in the last 72 hours. Cardiac Enzymes No results for input(s): "CKTOTAL", "CKMB", "CKMBINDEX", "TROPONINI" in the last 72 hours.  BNP: BNP (last 3 results) Recent Labs    10/15/22 1110 11/21/22 1122 02/26/23 0231  BNP 2,147.0* 914.3* 1,060.3*    ProBNP (last 3 results) Recent Labs    04/18/22 0958  PROBNP 587*     D-Dimer No results for input(s): "DDIMER" in the last 72 hours. Hemoglobin A1C Recent Labs    03/02/23 0500  HGBA1C 6.6*   Fasting Lipid Panel Recent Labs    03/02/23 0500  CHOL 101  HDL 33*  LDLCALC 61  TRIG 35  CHOLHDL 3.1   Thyroid Function Tests Recent Labs    03/02/23 0631  TSH 2.421    Other results:   Imaging    No results found.   Medications:     Scheduled Medications:  amiodarone  200 mg Oral Daily   atorvastatin  80 mg Oral Daily   Chlorhexidine Gluconate Cloth  6 each Topical Daily   multivitamin with minerals  1 tablet Oral Daily   Ensure Max Protein  11 oz Oral BID   sodium chloride flush  10-40 mL Intracatheter Q12H   tamsulosin  0.4 mg Oral QPC supper    Infusions:  heparin 1,200 Units/hr (03/02/23 2347)  milrinone 0.125 mcg/kg/min (03/02/23 0043)    PRN Medications: acetaminophen **OR** acetaminophen, ondansetron **OR** ondansetron (ZOFRAN) IV, mouth rinse, sodium chloride flush    Patient Profile   Steven [name] is a 67 y.o. 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   Admitted with acute on chronic systolic and diastolic CHF with low-output. Advanced Heart Failure consulted and workup started for advanced therapies.    Assessment/Plan    1. 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 CO 3.95. CI 2.15 - NYHA IV on admission - now on Milrinone 0.125 mcg/kg/min to help with renal perfusion. CO-OX 62% CVP 2-3 - GDMT limited by renal function and hypotension. - CVP 2-3. Scr back up. Stop dapa - no ? blocker w/ low output  - Unable to get cMRI due to device mismatch (medtronic generator and a [employer] lead) - VAD w/u underway. Plan to discuss at [facility] on Monday. Would like to see Scr imrpved   CAD - h/o large anterior MI 2013 treated w/ DES to LAD  - No CP. HS troponin trend flat, not c/w ACS. - on statin  - no ASA given chronic coumadin - no s/s angina   AKI on CKD IIIa - Scr up to 2 this admission, baseline ~1.2 - Scr up 1.6-> 1.9 today - Cont Milrinone to help renal function improve - stop dapa   Mural thrombus - On coumadin PTA, on heparin gtt - no bleeding   H/o VT s/p Medtronic ICD - likely scar mediated - Continue amiodarone 200 mg daily - s/p MDT ICD, last event 8/24 - Keep K>4, Mg >2 - stable   Length of Stay: 5  Arvilla Meres, MD  03/03/2023, 11:52 AM  Advanced Heart Failure Team Pager [phone] (M-F; 7a - 5p)  Please contact [facility] for night-coverage after hours (5p -7a ) and weekends on [email or web address]

## 2023-03-03 NOTE — Consult Note (Signed)
ANTICOAGULATION CONSULT NOTE - Follow-up  Pharmacy Consult for heparin Indication: apical mural thrombus  No Known Allergies  Patient Measurements: Height: 5\' 9"  (175.3 cm) Weight: 66.9 kg (147 lb 7.8 oz) IBW/kg (Calculated) : 70.7 Heparin Dosing Weight: ~72 kg  Vital Signs: Temp: 98.3 F (36.8 C) (09/07 0754) Temp Source: Oral (09/07 0754) BP: 107/71 (09/07 0755) Pulse Rate: 65 (09/07 0754)  Labs: Recent Labs    02/28/23 1322 02/28/23 1323 03/01/23 0322 03/01/23 1720 03/02/23 0500 03/02/23 0625 03/03/23 0454  HGB 14.3 14.3  --   --   --   --  14.5  HCT 42.0 42.0  --   --   --   --  42.7  PLT  --   --   --   --   --   --  176  APTT  --   --   --   --   --  104*  --   LABPROT  --   --  20.1*  --   --   --   --   INR  --   --  1.7*  --   --   --   --   HEPARINUNFRC  --   --   --  0.35 0.40  --  0.59  CREATININE  --   --  1.95*  --   --  1.64* 1.87*    Estimated Creatinine Clearance: 35.8 mL/min (A) (by C-G formula based on SCr of 1.87 mg/dL (H)).  Medical History: Past Medical History:  Diagnosis Date   Acute MI, anterior wall (HCC)    AICD (automatic cardioverter/defibrillator) present    CAD (coronary artery disease)    2D ECHO, 07/13/2011 - EF <25%, LV moderatelty dilated, LA moderately dilatedLEXISCAN, 12/14/2011 - moderate-severe perfusion defect seen in the basal anteroseptal, mid anterior, apicacl anterior, apical, apical inferior, and apical lateral regions, post-stress EF 25%, new EKG changes from baseline abnormalities   Cancer [facility])    Prostate   CHF (congestive heart failure) (HCC) 2012   Hypertension 08/08/2021   Inguinal hernia, left    Pneumonia    November 2023   Pre-diabetes     Assessment: 67 yo male with history of HFrEF, CAD, VT, 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  Warfarin started on admission and held for procedures (last dose 9/2).  Pharmacy consulted to dose  heparin.  Warfarin therapy held at this admission with anticipation of future procedures. Patient will continue on heparin infusion for now.  Heparin level 0.59, therapeutic with heparin infusion at 1200 units/hour. Hgb 14.5, Plt 176, stable. No signs or symptoms of bleeding or issues with the infusion reported per RN.   Goal of Therapy:  INR 2-3 Heparin level 0.3-0.7 units/ml Monitor platelets by anticoagulation protocol: Yes   Plan:  Continue heparin infusion 1200 units/hr Daily CBC, heparin level Monitor for signs/symptoms of bleeding  Stephenie Acres, PharmD PGY1 Pharmacy Resident 03/03/2023 8:08 AM

## 2023-03-03 NOTE — Progress Notes (Signed)
PROGRESS NOTE    Steven [name]  JXB:[id number] DOB: 11/04/1955 DOA: 02/26/2023 PCP: [name], MD     Brief Narrative:  Steven [name] is a 67 y.o. male with past medical history significant for chronic systolic congestive heart failure (LVEF <20%), ischemic cardiomyopathy, history of LV thrombus, history of ventricular tachycardia s/p ICD, CKD stage IIIb, hyperlipidemia, hypertension who presented to [facility] ED on 9/2 from home via EMS with acute onset shortness of breath.  Also associated with nausea.  Dyspnea worse with exertion.  Denies lower extremity edema but endorses orthopnea/PND.  Patient was recently seen in cardiology outpatient clinic, his torsemide was decreased to 20 mg every other day due to decreased GFR.  Patient reports compliance with his medications and adherence to a salt/fluid restricted diet.  Patient denies chest pain, no abdominal pain. On EMS arrival patient was noted to be tachypneic with elevated JVD and SpO2 78% on room air.  Patient was given nitroglycerin, placed on CPAP and transported to the ED for further evaluation.   In the ED, temperature 96.8 F, HR 85, RR 31, BP 127/66, SpO2 92% on BiPAP.  VBG with pH 7.280, pCO2 40.5, pO2 112.  WBC 9.9, hemoglobin 16.3, platelet count 225.  Sodium 135, potassium 4.0, chloride 108, CO2 18, glucose 166, BUN 38, creatinine 1.89.  AST 70, ALT 46, total bilirubin 0.8.  High sensitive troponin 7 followed by 13.  BNP 1060.3.  SARS Cov 2 PCR negative.  Chest x-ray with severe diffuse bilateral airspace disease worsening on the right and improving on left, reflective of edema versus infection.  Due to heart failure, cardiology, advanced heart failure team were consulted.  He underwent heart cath.  Patient was started on milrinone drip.  Currently undergoing evaluation for LVAD  New events last 24 hours / Subjective: No complaints on examination today  Assessment & Plan:   Active Problems:   Acute on chronic systolic  congestive heart failure (HCC)   History of VT   CAD (coronary artery disease)   AF (atrial fibrillation) (HCC)   Hypertension   CKD stage 3a, GFR 45-59 ml/min (HCC)   Malignant neoplasm of prostate (HCC)   Alcohol abuse   Goals of care, counseling/discussion   Acute on chronic congestive heart failure (HCC)   Acute respiratory failure with hypoxia (HCC)     Acute hypoxic/hypercapnic respiratory failure, POA Acute on chronic systolic congestive heart failure Pulmonary edema Patient presenting with acute onset shortness of breath 2 hours prior to arrival to the ED.  Patient was found tachypneic and hypoxic with SpO2 78% on room air.  Not oxygen dependent at baseline.  Recently had dose reduction of his torsemide outpatient due to worsening renal function.  Patient states he hears to salt/fluid restricted diet.  Patient was on BiPAP on arrival with a VBG with elevated pCO2 of 40.5.  Chest x-ray with findings consistent with pulmonary edema.  COVID PCR negative. TTE with LVEF less than 20%, grade 2 diastolic dysfunction, previous LV thrombus not visualized, IVC dilated, RVSP 55.4 mmHg. -- Cardiology consulted -- Status post heart cath -- Now on milrinone -- Working up for LVAD   Transaminitis: Resolved LFTs elevated, likely secondary to hepatic congestion in the setting of CHF exacerbation as above   History of LV thrombus Repeat TTE shows no LV thrombus. -- IV heparin   History of ventricular tachycardia s/p ICD -- Amiodarone    Paroxysmal atrial fibrillation -- IV heparin   AKI on  CKD stage IIIb Baseline creatinine 1.3-1.7.  Creatinine 1.89 on admission, trended up to 2.06.  Etiology likely secondary to volume overload versus ATN with poor perfusion due to low output cardiomyopathy.   CAD Ischemic cardiomyopathy -- Continue statin   Essential hypertension -- Spironolactone on hold   Hyperlipidemia -- Atorvastatin   Malignant neoplasm of prostate -- Continue outpatient  follow-up with oncology   DVT prophylaxis: IV heparin SCDs Start: 02/26/23 1502  Code Status: Full code Family Communication: None Disposition Plan: Home Status is: Inpatient Remains inpatient appropriate because: IV therapies, advanced heart failure team    Antimicrobials:  Anti-infectives (From admission, onward)    None        Objective: Vitals:   03/03/23 0048 03/03/23 0432 03/03/23 0754 03/03/23 0755  BP: 101/68 110/72 94/60 107/71  Pulse: 64 63 65   Resp: 17 16 18 18   Temp: 97.9 F (36.6 C) 97.7 F (36.5 C) 98.3 F (36.8 C)   TempSrc: Oral Oral Oral   SpO2: 100% 97% 97% 96%  Weight:  66.9 kg    Height:        Intake/Output Summary (Last 24 hours) at 03/03/2023 1143 Last data filed at 03/03/2023 0840 Gross per 24 hour  Intake 755.73 ml  Output 3975 ml  Net -3219.27 ml   Filed Weights   03/01/23 0424 03/02/23 0320 03/03/23 0432  Weight: 67.3 kg 67.6 kg 66.9 kg   Examination: General exam: Appears calm and comfortable  Respiratory system: Clear to auscultation. Respiratory effort normal.  On room air Cardiovascular system: S1 & S2 heard, RRR. Gastrointestinal system: Abdomen is nondistended, soft and nontender. Normal bowel sounds heard. Central nervous system: Alert and oriented. Non focal exam. Speech clear  Extremities: Symmetric in appearance bilaterally  Skin: No rashes, lesions or ulcers on exposed skin  Psychiatry: Judgement and insight appear stable. Mood & affect appropriate.     Data Reviewed: I have personally reviewed following labs and imaging studies  CBC: Recent Labs  Lab 02/26/23 0231 02/26/23 0253 02/27/23 0416 02/28/23 0325 02/28/23 1322 02/28/23 1323 03/03/23 0454  WBC 9.9  --  10.3 9.2  --   --  7.5  NEUTROABS 2.8  --   --   --   --   --   --   HGB 16.3   < > 13.8 13.4 14.3 14.3 14.5  HCT 50.5   < > 41.9 40.5 42.0 42.0 42.7  MCV 97.7  --  93.3 93.3  --   --  90.9  PLT 225  --  138* 141*  --   --  176   < > = values in  this interval not displayed.   Basic Metabolic Panel: Recent Labs  Lab 02/27/23 0416 02/28/23 0325 02/28/23 1322 02/28/23 1323 03/01/23 0322 03/02/23 0625 03/03/23 0454  NA 135 132* 137 137 130* 132* 132*  K 4.0 3.6 4.0 4.0 3.9 3.9 4.3  CL 105 99  --   --  98 98 94*  CO2 22 22  --   --  20* 22 24  GLUCOSE 127* 101*  --   --  104* 115* 121*  BUN 34* 36*  --   --  44* 34* 37*  CREATININE 2.06* 1.93*  --   --  1.95* 1.64* 1.87*  CALCIUM 8.5* 8.7*  --   --  8.3* 8.7* 9.4  MG  --   --   --   --  2.3 2.3 2.5*   GFR: Estimated Creatinine  Clearance: 35.8 mL/min (A) (by C-G formula based on SCr of 1.87 mg/dL (H)). Liver Function Tests: Recent Labs  Lab 02/26/23 0410 02/28/23 0325  AST 70* 24  ALT 46* 24  ALKPHOS 73 60  BILITOT 0.8 2.1*  PROT 6.0* 6.7  ALBUMIN 3.0* 3.2*   No results for input(s): "LIPASE", "AMYLASE" in the last 168 hours. No results for input(s): "AMMONIA" in the last 168 hours. Coagulation Profile: Recent Labs  Lab 02/26/23 0231 02/27/23 0416 02/28/23 0325 03/01/23 0322  INR 2.8* 2.9* 2.5* 1.7*   Cardiac Enzymes: No results for input(s): "CKTOTAL", "CKMB", "CKMBINDEX", "TROPONINI" in the last 168 hours. BNP (last 3 results) Recent Labs    04/18/22 0958  PROBNP 587*   HbA1C: Recent Labs    03/02/23 0500  HGBA1C 6.6*   CBG: No results for input(s): "GLUCAP" in the last 168 hours. Lipid Profile: Recent Labs    03/02/23 0500  CHOL 101  HDL 33*  LDLCALC 61  TRIG 35  CHOLHDL 3.1   Thyroid Function Tests: Recent Labs    03/02/23 0625 03/02/23 0631  TSH  --  2.421  FREET4 2.03*  --    Anemia Panel: No results for input(s): "VITAMINB12", "FOLATE", "FERRITIN", "TIBC", "IRON", "RETICCTPCT" in the last 72 hours. Sepsis Labs: Recent Labs  Lab 02/26/23 1215 02/26/23 1615  LATICACIDVEN 1.7 1.9    Recent Results (from the past 240 hour(s))  SARS Coronavirus 2 by RT PCR (hospital order, performed in [facility] hospital lab) *cepheid  single result test* Anterior Nasal Swab     Status: None   Collection Time: 02/26/23  2:25 AM   Specimen: Anterior Nasal Swab  Result Value Ref Range Status   SARS Coronavirus 2 by RT PCR NEGATIVE NEGATIVE Final    Comment: Performed at [facility] Lab, [address], [place], [place] [address]      Radiology Studies: VAS US DOPPLER PRE VAD  Result Date: 03/02/2023 PERIOPERATIVE VASCULAR EVALUATION Patient Name:  JT HAEFS [facility]  Date of Exam:   03/02/2023 Medical Rec #: [id number]     Accession #:    [id number] Date of Birth: [birth year]     Patient Gender: M Patient Age:   36 years Exam Location:  [facility] Procedure:      VAS US DOPPLER PRE VAD Referring Phys: Reuel Boom BENSIMHON --------------------------------------------------------------------------------  Indications:      Pre-VAD. Risk Factors:     Hypertension, hyperlipidemia, past history of smoking, prior                   MI, coronary artery disease. Other Factors:    AF, CKD, CHF, ICD. Comparison Study: No previous exams Performing Technologist: Jean Rosenthal RDMS, RVT Supporting Technologist: Ernestene Mention RVT, RDMS  Examination Guidelines: A complete evaluation includes B-mode imaging, spectral Doppler, color Doppler, and power Doppler as needed of all accessible portions of each vessel. Bilateral testing is considered an integral part of a complete examination. Limited examinations for reoccurring indications may be performed as noted.  Right Carotid Findings: +----------+--------+--------+--------+--------+------------------+           PSV cm/sEDV cm/sStenosisDescribeComments           +----------+--------+--------+--------+--------+------------------+ CCA Prox  85      12                                         +----------+--------+--------+--------+--------+------------------+ CCA  Distal92      17                      intimal thickening +----------+--------+--------+--------+--------+------------------+ ICA Prox   87      26      1-39%   calcific                   +----------+--------+--------+--------+--------+------------------+ ICA Distal32      18                      tortuous           +----------+--------+--------+--------+--------+------------------+ ECA       96      11                                         +----------+--------+--------+--------+--------+------------------+ +----------+--------+-------+----------------+------------+           PSV cm/sEDV cmsDescribe        Arm Pressure +----------+--------+-------+----------------+------------+ Subclavian67             Multiphasic, WNL             +----------+--------+-------+----------------+------------+ +---------+--------+--+--------+-+---------+ VertebralPSV cm/s30EDV cm/s8Antegrade +---------+--------+--+--------+-+---------+ Left Carotid Findings: +----------+--------+--------+--------+--------+------------------+           PSV cm/sEDV cm/sStenosisDescribeComments           +----------+--------+--------+--------+--------+------------------+ CCA Prox  109     11                      intimal thickening +----------+--------+--------+--------+--------+------------------+ CCA Distal83      14                      intimal thickening +----------+--------+--------+--------+--------+------------------+ ICA Prox  69      16      1-39%   calcific                   +----------+--------+--------+--------+--------+------------------+ ICA Distal                                tortuous           +----------+--------+--------+--------+--------+------------------+ ECA       86      9                                          +----------+--------+--------+--------+--------+------------------+ +----------+--------+--------+----------------+------------+ SubclavianPSV cm/sEDV cm/sDescribe        Arm Pressure +----------+--------+--------+----------------+------------+           106              Multiphasic, [id number]          +----------+--------+--------+----------------+------------+ +---------+--------+--+--------+--+---------+ VertebralPSV cm/s56EDV cm/s14Antegrade +---------+--------+--+--------+--+---------+  ABI Findings: +---------+------------------+-----+---------+--------+ Right    Rt Pressure (mmHg)IndexWaveform Comment  +---------+------------------+-----+---------+--------+ Brachial                        triphasicPICC     +---------+------------------+-----+---------+--------+ PTA      164               1.46 biphasic          +---------+------------------+-----+---------+--------+ DP       188  1.68 biphasic          +---------+------------------+-----+---------+--------+ Clinton Gallant                     Normal            +---------+------------------+-----+---------+--------+ +---------+------------------+-----+---------+-------+ Left     Lt Pressure (mmHg)IndexWaveform Comment +---------+------------------+-----+---------+-------+ Brachial 112                    triphasic        +---------+------------------+-----+---------+-------+ PTA      117               1.04 biphasic         +---------+------------------+-----+---------+-------+ DP       110               0.98 biphasic         +---------+------------------+-----+---------+-------+ Barry Dienes                     Abnormal         +---------+------------------+-----+---------+-------+  Summary: Right Carotid: Velocities in the right ICA are consistent with a 1-39% stenosis. Left Carotid: Velocities in the left ICA are consistent with a 1-39% stenosis. Vertebrals:  Bilateral vertebral arteries demonstrate antegrade flow. Subclavians: Normal flow hemodynamics were seen in bilateral subclavian              arteries.  *See table(s) above for measurements and observations. Right ABI: Resting right ankle-brachial index indicates noncompressible right lower  extremity arteries. The right toe-brachial index is normal. Left ABI: Resting left ankle-brachial index is within normal range. The left toe-brachial index is abnormal.     Preliminary    VAS [place] LOWER EXTREMITY VENOUS (DVT)  Result Date: 03/02/2023  Lower Venous DVT Study Patient Name:  QUENTRELL RODEMAN [facility]  Date of Exam:   03/02/2023 Medical Rec #: [id number]     Accession #:    [id number] Date of Birth: [birth year]     Patient Gender: M Patient Age:   44 years Exam Location:  [facility] Procedure:      VAS [place] LOWER EXTREMITY VENOUS (DVT) Referring Phys: Reuel Boom BENSIMHON --------------------------------------------------------------------------------  Indications: Pre-VAD workup.  Comparison Study: No prior studies. Performing Technologist: Jean Rosenthal RDMS, RVT  Examination Guidelines: A complete evaluation includes B-mode imaging, spectral Doppler, color Doppler, and power Doppler as needed of all accessible portions of each vessel. Bilateral testing is considered an integral part of a complete examination. Limited examinations for reoccurring indications may be performed as noted. The reflux portion of the exam is performed with the patient in reverse Trendelenburg.  +---------+---------------+---------+-----------+----------+--------------+ RIGHT    CompressibilityPhasicitySpontaneityPropertiesThrombus Aging +---------+---------------+---------+-----------+----------+--------------+ CFV      Full           Yes      Yes                                 +---------+---------------+---------+-----------+----------+--------------+ SFJ      Full                                                        +---------+---------------+---------+-----------+----------+--------------+ FV Prox  Full                                                        +---------+---------------+---------+-----------+----------+--------------+  FV Mid   Full                                                         +---------+---------------+---------+-----------+----------+--------------+ FV DistalFull           Yes      Yes                                 +---------+---------------+---------+-----------+----------+--------------+ PFV      Full                                                        +---------+---------------+---------+-----------+----------+--------------+ POP      Full           Yes      Yes                                 +---------+---------------+---------+-----------+----------+--------------+ PTV      Full                                                        +---------+---------------+---------+-----------+----------+--------------+ PERO     Full                                                        +---------+---------------+---------+-----------+----------+--------------+   +---------+---------------+---------+-----------+----------+--------------+ LEFT     CompressibilityPhasicitySpontaneityPropertiesThrombus Aging +---------+---------------+---------+-----------+----------+--------------+ CFV      Full           Yes      Yes                                 +---------+---------------+---------+-----------+----------+--------------+ SFJ      Full                                                        +---------+---------------+---------+-----------+----------+--------------+ FV Prox  Full                                                        +---------+---------------+---------+-----------+----------+--------------+ FV Mid   Full                                                        +---------+---------------+---------+-----------+----------+--------------+  FV DistalFull           Yes      Yes                                 +---------+---------------+---------+-----------+----------+--------------+ PFV      Full                                                         +---------+---------------+---------+-----------+----------+--------------+ POP      Full           Yes      Yes                                 +---------+---------------+---------+-----------+----------+--------------+ PTV      Full                                                        +---------+---------------+---------+-----------+----------+--------------+ PERO     Full                                                        +---------+---------------+---------+-----------+----------+--------------+     Summary: RIGHT: - There is no evidence of deep vein thrombosis in the lower extremity.  - No cystic structure found in the popliteal fossa.  LEFT: - There is no evidence of deep vein thrombosis in the lower extremity.  - No cystic structure found in the popliteal fossa.  *See table(s) above for measurements and observations.    Preliminary    CT CHEST ABDOMEN PELVIS WO CONTRAST  Result Date: 03/01/2023 CLINICAL DATA:  Ventricular assist device evaluation. EXAM: CT CHEST, ABDOMEN AND PELVIS WITHOUT CONTRAST TECHNIQUE: Multidetector CT imaging of the chest, abdomen and pelvis was performed following the standard protocol without IV contrast. RADIATION DOSE REDUCTION: This exam was performed according to the departmental dose-optimization program which includes automated exposure control, adjustment of the mA and/or kV according to patient size and/or use of iterative reconstruction technique. COMPARISON:  Chest radiograph 02/26/2023. Abdominopelvic CT 06/17/2021, PET CT 03/31/2021 FINDINGS: CT CHEST FINDINGS Cardiovascular: Left-sided pacemaker with lead tips in the right atrium and right ventricle. The heart is mildly enlarged. Diffuse pericardial calcifications. There are also likely subendocardial calcifications at the left ventricular apex. No significant pericardial effusion. There are coronary artery calcifications. The thoracic aorta is normal in caliber, moderate aortic  atherosclerosis. Ascending aorta measures 3.7 cm greatest dimension. Main pulmonary artery is normal at 2.7 cm. Mediastinum/Nodes: No mediastinal adenopathy. Evaluation of the hilar structures is limited in the absence of IV contrast. Lobulated fluid density structure in the right retrocrural region is stable from prior exam, of doubtful clinical significance. Minimal hiatal hernia. No thyroid nodule. The previous cystic structure inferior to the left thyroid is not definitively seen on this unenhanced exam. Lungs/Pleura: Mild to moderate emphysema. Mild scarring in the medial right lower lobe adjacent to  thoracic spine osteophytes. There are hazy ground-glass opacities in both upper lobes. No definite septal thickening. No pleural fluid. Trachea and central airways are clear. No pulmonary mass or dominant nodule. Musculoskeletal: Thoracic spondylosis with anterior spurring. There are no acute or suspicious osseous abnormalities. No chest wall soft tissue abnormalities. CT ABDOMEN PELVIS FINDINGS Hepatobiliary: No focal liver abnormality is seen. No gallstones, gallbladder wall thickening, or biliary dilatation. Pancreas: Unremarkable unenhanced appearance. Spleen: Peripheral subcapsular calcifications laterally. Normal in size. Adrenals/Urinary Tract: No adrenal nodule. No hydronephrosis or renal calculi. There may be mild bilateral renal parenchymal atrophy. No renal inflammation. Partially distended urinary bladder. Stomach/Bowel: Minimal hiatal hernia. No bowel obstruction or inflammation. Moderate volume of stool in the colon. Few colonic diverticula without diverticulitis. Slight edema in the right lower small bowel mesentery is chronic, and of doubtful clinical significance. Normal appendix. Vascular/Lymphatic: Advanced aortic and branch atherosclerosis. No aortic aneurysm. No bulky abdominopelvic adenopathy in this unenhanced exam. Reproductive: Enlarged prostate gland causing nodular mass effect on the  bladder base. Soft tissue density again seen in the right inguinal canal. Other: No ascites. Soft tissue density again seen in the right inguinal canal. Diminutive fat containing umbilical hernia. Musculoskeletal: Mild lumbar degenerative change. There are no acute or suspicious osseous abnormalities. IMPRESSION: 1. Mild cardiomegaly. Diffuse pericardial calcifications. There are also likely subendocardial calcifications at the left ventricular apex. 2. Hazy ground-glass opacities in the upper lobes, may represent pulmonary edema in the appropriate clinical setting. 3. No acute abnormality in the abdomen/pelvis. 4. Enlarged prostate gland causing nodular mass effect on the bladder base. 5. Colonic diverticulosis without diverticulitis. 6. Soft tissue density again seen in the right inguinal canal, may represent a spermatic cord hernia. This is stable from prior exams. 7. The previous cystic structure inferior to the left thyroid lobe is not definitively seen on this unenhanced exam. Emphysema (ICD10-J43.9).  Aortic Atherosclerosis (ICD10-I70.0). Electronically Signed   By: Narda Rutherford M.D.   On: 03/01/2023 16:06   DG Orthopantogram  Result Date: 03/01/2023 CLINICAL DATA:  Preoperative evaluation to rule out surgical contraindications. No symptoms. EXAM: ORTHOPANTOGRAM/PANORAMIC COMPARISON:  None Available. FINDINGS: Unremarkable teeth with no cavities or periapical abscesses seen. IMPRESSION: Negative. Electronically Signed   By: Beckie Salts M.D.   On: 03/01/2023 15:46   [place] EKG SITE RITE  Result Date: 03/01/2023 If Site Rite image not attached, placement could not be confirmed due to current cardiac rhythm.     Scheduled Meds:  amiodarone  200 mg Oral Daily   atorvastatin  80 mg Oral Daily   Chlorhexidine Gluconate Cloth  6 each Topical Daily   dapagliflozin propanediol  10 mg Oral Daily   multivitamin with minerals  1 tablet Oral Daily   Ensure Max Protein  11 oz Oral BID   sodium chloride  flush  10-40 mL Intracatheter Q12H   tamsulosin  0.4 mg Oral QPC supper   Continuous Infusions:  heparin 1,200 Units/hr (03/02/23 2347)   milrinone 0.125 mcg/kg/min (03/02/23 0043)     LOS: 5 days   Time spent: 20 minutes   Noralee Stain, DO [facility] 03/03/2023, 11:43 AM   Available via Epic secure chat 7am-7pm After these hours, please refer to coverage provider listed on [email or web address]

## 2023-03-03 NOTE — Plan of Care (Signed)

## 2023-03-04 DIAGNOSIS — I5023 Acute on chronic systolic (congestive) heart failure: Secondary | ICD-10-CM | POA: Diagnosis not present

## 2023-03-04 LAB — CBC
HCT: 40.8 % (ref 39.0–52.0)
Hemoglobin: 13.9 g/dL (ref 13.0–17.0)
MCH: 31.6 pg (ref 26.0–34.0)
MCHC: 34.1 g/dL (ref 30.0–36.0)
MCV: 92.7 fL (ref 80.0–100.0)
Platelets: 178 10*3/uL (ref 150–400)
RBC: 4.4 MIL/uL (ref 4.22–5.81)
RDW: 13.1 % (ref 11.5–15.5)
WBC: 7.7 10*3/uL (ref 4.0–10.5)
nRBC: 0 % (ref 0.0–0.2)

## 2023-03-04 LAB — HEPARIN LEVEL (UNFRACTIONATED): Heparin Unfractionated: 0.51 [IU]/mL (ref 0.30–0.70)

## 2023-03-04 LAB — BASIC METABOLIC PANEL
Anion gap: 11 (ref 5–15)
BUN: 35 mg/dL — ABNORMAL HIGH (ref 8–23)
CO2: 23 mmol/L (ref 22–32)
Calcium: 8.9 mg/dL (ref 8.9–10.3)
Chloride: 97 mmol/L — ABNORMAL LOW (ref 98–111)
Creatinine, Ser: 1.7 mg/dL — ABNORMAL HIGH (ref 0.61–1.24)
GFR, Estimated: 43 mL/min — ABNORMAL LOW (ref >60)
Glucose, Bld: 96 mg/dL (ref 70–99)
Potassium: 4.4 mmol/L (ref 3.5–5.1)
Sodium: 131 mmol/L — ABNORMAL LOW (ref 135–145)

## 2023-03-04 LAB — PSA, TOTAL AND FREE
PSA, Free Pct: 11 %
PSA, Free: 0.23 ng/mL
Prostate Specific Ag, Serum: 2.1 ng/mL (ref 0.0–4.0)

## 2023-03-04 LAB — COOXEMETRY PANEL
Carboxyhemoglobin: 2 % — ABNORMAL HIGH (ref 0.5–1.5)
Methemoglobin: 0.7 % (ref 0.0–1.5)
O2 Saturation: 67.5 %
Total hemoglobin: 14.3 g/dL (ref 12.0–16.0)

## 2023-03-04 LAB — MAGNESIUM: Magnesium: 2.3 mg/dL (ref 1.7–2.4)

## 2023-03-04 MED ORDER — ENSURE ENLIVE PO LIQD
237.0000 mL | Freq: Three times a day (TID) | ORAL | Status: DC
  Administered 2023-03-04 – 2023-03-15 (×18): 237 mL via ORAL
  Filled 2023-03-04 (×2): qty 237

## 2023-03-04 NOTE — Plan of Care (Signed)
  Problem: Activity: Goal: Risk for activity intolerance will decrease Outcome: Progressing   

## 2023-03-04 NOTE — Progress Notes (Signed)
PROGRESS NOTE    Steven [name]  ZOX:[id number] DOB: 1956/05/16 DOA: 02/26/2023 PCP: [name], MD     Brief Narrative:  Steven [name] is a 67 y.o. male with past medical history significant for chronic systolic congestive heart failure (LVEF <20%), ischemic cardiomyopathy, history of LV thrombus, history of ventricular tachycardia s/p ICD, CKD stage IIIb, hyperlipidemia, hypertension who presented to [facility] ED on 9/2 from home via EMS with acute onset shortness of breath.  Also associated with nausea.  Dyspnea worse with exertion.  Denies lower extremity edema but endorses orthopnea/PND.  Patient was recently seen in cardiology outpatient clinic, his torsemide was decreased to 20 mg every other day due to decreased GFR.  Patient reports compliance with his medications and adherence to a salt/fluid restricted diet.  Patient denies chest pain, no abdominal pain. On EMS arrival patient was noted to be tachypneic with elevated JVD and SpO2 78% on room air.  Patient was given nitroglycerin, placed on CPAP and transported to the ED for further evaluation.   In the ED, temperature 96.8 F, HR 85, RR 31, BP 127/66, SpO2 92% on BiPAP.  VBG with pH 7.280, pCO2 40.5, pO2 112.  WBC 9.9, hemoglobin 16.3, platelet count 225.  Sodium 135, potassium 4.0, chloride 108, CO2 18, glucose 166, BUN 38, creatinine 1.89.  AST 70, ALT 46, total bilirubin 0.8.  High sensitive troponin 7 followed by 13.  BNP 1060.3.  SARS Cov 2 PCR negative.  Chest x-ray with severe diffuse bilateral airspace disease worsening on the right and improving on left, reflective of edema versus infection.  Due to heart failure, cardiology, advanced heart failure team were consulted.  He underwent heart cath.  Patient was started on milrinone drip.  Currently undergoing evaluation for LVAD  New events last 24 hours / Subjective: No acute events.  Assessment & Plan:   Active Problems:   Acute on chronic systolic congestive heart  failure (HCC)   History of VT   CAD (coronary artery disease)   AF (atrial fibrillation) (HCC)   Hypertension   CKD stage 3a, GFR 45-59 ml/min (HCC)   Malignant neoplasm of prostate (HCC)   Alcohol abuse   Goals of care, counseling/discussion   Acute on chronic congestive heart failure (HCC)   Acute respiratory failure with hypoxia (HCC)     Acute hypoxic/hypercapnic respiratory failure, POA Acute on chronic systolic congestive heart failure Pulmonary edema Patient presenting with acute onset shortness of breath 2 hours prior to arrival to the ED.  Patient was found tachypneic and hypoxic with SpO2 78% on room air.  Not oxygen dependent at baseline.  Recently had dose reduction of his torsemide outpatient due to worsening renal function.  Patient states he hears to salt/fluid restricted diet.  Patient was on BiPAP on arrival with a VBG with elevated pCO2 of 40.5.  Chest x-ray with findings consistent with pulmonary edema.  COVID PCR negative. TTE with LVEF less than 20%, grade 2 diastolic dysfunction, previous LV thrombus not visualized, IVC dilated, RVSP 55.4 mmHg. -- Cardiology consulted -- Status post heart cath -- Now on milrinone -- Working up for LVAD   Transaminitis: Resolved LFTs elevated, likely secondary to hepatic congestion in the setting of CHF exacerbation as above   History of LV thrombus Repeat TTE shows no LV thrombus. -- IV heparin   History of ventricular tachycardia s/p ICD -- Amiodarone    Paroxysmal atrial fibrillation -- IV heparin   AKI on CKD stage  IIIb Baseline creatinine 1.3-1.7.  Creatinine 1.89 on admission, trended up to 2.06.  Etiology likely secondary to volume overload versus ATN with poor perfusion due to low output cardiomyopathy.   CAD Ischemic cardiomyopathy -- Continue statin   Essential hypertension -- Spironolactone on hold   Hyperlipidemia -- Atorvastatin   Malignant neoplasm of prostate -- Continue outpatient follow-up with  oncology   DVT prophylaxis: IV heparin SCDs Start: 02/26/23 1502  Code Status: Full code Family Communication: None Disposition Plan: Home Status is: Inpatient Remains inpatient appropriate because: IV therapies, advanced heart failure team    Antimicrobials:  Anti-infectives (From admission, onward)    None        Objective: Vitals:   03/03/23 2016 03/04/23 0022 03/04/23 0330 03/04/23 0825  BP: 104/73 100/69 100/71 95/60  Pulse: 70 64 67 63  Resp: 16 18 16 18   Temp: 98.5 F (36.9 C) 98.7 F (37.1 C) (!) 97.5 F (36.4 C) 97.8 F (36.6 C)  TempSrc: Oral Oral Oral Oral  SpO2: 100% 97% 100% 98%  Weight:   67.5 kg   Height:        Intake/Output Summary (Last 24 hours) at 03/04/2023 1107 Last data filed at 03/04/2023 0859 Gross per 24 hour  Intake 598.24 ml  Output 2050 ml  Net -1451.76 ml   Filed Weights   03/02/23 0320 03/03/23 0432 03/04/23 0330  Weight: 67.6 kg 66.9 kg 67.5 kg   Examination: General exam: Appears calm and comfortable  Respiratory system: Clear to auscultation. Respiratory effort normal.  On room air Cardiovascular system: S1 & S2 heard, RRR. Gastrointestinal system: Abdomen is nondistended, soft and nontender. Normal bowel sounds heard. Central nervous system: Alert and oriented. Non focal exam. Speech clear  Extremities: Symmetric in appearance bilaterally  Skin: No rashes, lesions or ulcers on exposed skin  Psychiatry: Judgement and insight appear stable. Mood & affect appropriate.     Data Reviewed: I have personally reviewed following labs and imaging studies  CBC: Recent Labs  Lab 02/26/23 0231 02/26/23 0253 02/27/23 0416 02/28/23 0325 02/28/23 1322 02/28/23 1323 03/03/23 0454 03/04/23 0624  WBC 9.9  --  10.3 9.2  --   --  7.5 7.7  NEUTROABS 2.8  --   --   --   --   --   --   --   HGB 16.3   < > 13.8 13.4 14.3 14.3 14.5 13.9  HCT 50.5   < > 41.9 40.5 42.0 42.0 42.7 40.8  MCV 97.7  --  93.3 93.3  --   --  90.9 92.7  PLT  225  --  138* 141*  --   --  176 178   < > = values in this interval not displayed.   Basic Metabolic Panel: Recent Labs  Lab 02/28/23 0325 02/28/23 1322 02/28/23 1323 03/01/23 0322 03/02/23 0625 03/03/23 0454 03/04/23 0624  NA 132*   < > 137 130* 132* 132* 131*  K 3.6   < > 4.0 3.9 3.9 4.3 4.4  CL 99  --   --  98 98 94* 97*  CO2 22  --   --  20* 22 24 23   GLUCOSE 101*  --   --  104* 115* 121* 96  BUN 36*  --   --  44* 34* 37* 35*  CREATININE 1.93*  --   --  1.95* 1.64* 1.87* 1.70*  CALCIUM 8.7*  --   --  8.3* 8.7* 9.4 8.9  MG  --   --   --  2.3 2.3 2.5* 2.3   < > = values in this interval not displayed.   GFR: Estimated Creatinine Clearance: 39.7 mL/min (A) (by C-G formula based on SCr of 1.7 mg/dL (H)). Liver Function Tests: Recent Labs  Lab 02/26/23 0410 02/28/23 0325  AST 70* 24  ALT 46* 24  ALKPHOS 73 60  BILITOT 0.8 2.1*  PROT 6.0* 6.7  ALBUMIN 3.0* 3.2*   No results for input(s): "LIPASE", "AMYLASE" in the last 168 hours. No results for input(s): "AMMONIA" in the last 168 hours. Coagulation Profile: Recent Labs  Lab 02/26/23 0231 02/27/23 0416 02/28/23 0325 03/01/23 0322  INR 2.8* 2.9* 2.5* 1.7*   Cardiac Enzymes: No results for input(s): "CKTOTAL", "CKMB", "CKMBINDEX", "TROPONINI" in the last 168 hours. BNP (last 3 results) Recent Labs    04/18/22 0958  PROBNP 587*   HbA1C: Recent Labs    03/02/23 0500  HGBA1C 6.6*   CBG: No results for input(s): "GLUCAP" in the last 168 hours. Lipid Profile: Recent Labs    03/02/23 0500  CHOL 101  HDL 33*  LDLCALC 61  TRIG 35  CHOLHDL 3.1   Thyroid Function Tests: Recent Labs    03/02/23 0625 03/02/23 0631  TSH  --  2.421  FREET4 2.03*  --    Anemia Panel: No results for input(s): "VITAMINB12", "FOLATE", "FERRITIN", "TIBC", "IRON", "RETICCTPCT" in the last 72 hours. Sepsis Labs: Recent Labs  Lab 02/26/23 1215 02/26/23 1615  LATICACIDVEN 1.7 1.9    Recent Results (from the past 240  hour(s))  SARS Coronavirus 2 by RT PCR (hospital order, performed in [facility] hospital lab) *cepheid single result test* Anterior Nasal Swab     Status: None   Collection Time: 02/26/23  2:25 AM   Specimen: Anterior Nasal Swab  Result Value Ref Range Status   SARS Coronavirus 2 by RT PCR NEGATIVE NEGATIVE Final    Comment: Performed at [facility], [address] [place], [place] [address]      Radiology Studies: VAS US DOPPLER PRE VAD  Result Date: 03/03/2023 PERIOPERATIVE VASCULAR EVALUATION Patient Name:  MARS BURCZYK [facility]  Date of Exam:   03/02/2023 Medical Rec #: [id number]     Accession #:    [id number] Date of Birth: 05/20/1956     Patient Gender: M Patient Age:   38 years Exam Location:  [facility] Procedure:      VAS US DOPPLER PRE VAD Referring Phys: Reuel Boom BENSIMHON --------------------------------------------------------------------------------  Indications:      Pre-VAD. Risk Factors:     Hypertension, hyperlipidemia, past history of smoking, prior                   MI, coronary artery disease. Other Factors:    AF, CKD, CHF, ICD. Comparison Study: No previous exams Performing Technologist: Jean Rosenthal RDMS, RVT Supporting Technologist: Ernestene Mention RVT, RDMS  Examination Guidelines: A complete evaluation includes B-mode imaging, spectral Doppler, color Doppler, and power Doppler as needed of all accessible portions of each vessel. Bilateral testing is considered an integral part of a complete examination. Limited examinations for reoccurring indications may be performed as noted.  Right Carotid Findings: +----------+--------+--------+--------+--------+------------------+           PSV cm/sEDV cm/sStenosisDescribeComments           +----------+--------+--------+--------+--------+------------------+ CCA Prox  85      12                                         +----------+--------+--------+--------+--------+------------------+  CCA Distal92      17                       intimal thickening +----------+--------+--------+--------+--------+------------------+ ICA Prox  87      26      1-39%   calcific                   +----------+--------+--------+--------+--------+------------------+ ICA Distal32      18                      tortuous           +----------+--------+--------+--------+--------+------------------+ ECA       96      11                                         +----------+--------+--------+--------+--------+------------------+ +----------+--------+-------+----------------+------------+           PSV cm/sEDV cmsDescribe        Arm Pressure +----------+--------+-------+----------------+------------+ Subclavian67             Multiphasic, WNL             +----------+--------+-------+----------------+------------+ +---------+--------+--+--------+-+---------+ VertebralPSV cm/s30EDV cm/s8Antegrade +---------+--------+--+--------+-+---------+ Left Carotid Findings: +----------+--------+--------+--------+--------+------------------+           PSV cm/sEDV cm/sStenosisDescribeComments           +----------+--------+--------+--------+--------+------------------+ CCA Prox  109     11                      intimal thickening +----------+--------+--------+--------+--------+------------------+ CCA Distal83      14                      intimal thickening +----------+--------+--------+--------+--------+------------------+ ICA Prox  69      16      1-39%   calcific                   +----------+--------+--------+--------+--------+------------------+ ICA Distal                                tortuous           +----------+--------+--------+--------+--------+------------------+ ECA       86      9                                          +----------+--------+--------+--------+--------+------------------+ +----------+--------+--------+----------------+------------+ SubclavianPSV cm/sEDV cm/sDescribe         Arm Pressure +----------+--------+--------+----------------+------------+           106             Multiphasic, RUE454          +----------+--------+--------+----------------+------------+ +---------+--------+--+--------+--+---------+ VertebralPSV cm/s56EDV cm/s14Antegrade +---------+--------+--+--------+--+---------+  ABI Findings: +---------+------------------+-----+---------+--------+ Right    Rt Pressure (mmHg)IndexWaveform Comment  +---------+------------------+-----+---------+--------+ Brachial                        triphasicPICC     +---------+------------------+-----+---------+--------+ PTA      164               1.46 biphasic          +---------+------------------+-----+---------+--------+ DP       188  1.68 biphasic          +---------+------------------+-----+---------+--------+ Clinton Gallant                     Normal            +---------+------------------+-----+---------+--------+ +---------+------------------+-----+---------+-------+ Left     Lt Pressure (mmHg)IndexWaveform Comment +---------+------------------+-----+---------+-------+ Brachial 112                    triphasic        +---------+------------------+-----+---------+-------+ PTA      117               1.04 biphasic         +---------+------------------+-----+---------+-------+ DP       110               0.98 biphasic         +---------+------------------+-----+---------+-------+ Barry Dienes                     Abnormal         +---------+------------------+-----+---------+-------+  Summary: Right Carotid: Velocities in the right ICA are consistent with a 1-39% stenosis. Left Carotid: Velocities in the left ICA are consistent with a 1-39% stenosis. Vertebrals:  Bilateral vertebral arteries demonstrate antegrade flow. Subclavians: Normal flow hemodynamics were seen in bilateral subclavian              arteries.  *See table(s) above for measurements and  observations. Right ABI: Resting right ankle-brachial index indicates noncompressible right lower extremity arteries. The right toe-brachial index is normal. Left ABI: Resting left ankle-brachial index is within normal range. The left toe-brachial index is abnormal.  Electronically signed by Coral Else MD on 03/03/2023 at 7:31:13 PM.    Final       Scheduled Meds:  amiodarone  200 mg Oral Daily   atorvastatin  80 mg Oral Daily   Chlorhexidine Gluconate Cloth  6 each Topical Daily   multivitamin with minerals  1 tablet Oral Daily   Ensure Max Protein  11 oz Oral BID   sodium chloride flush  10-40 mL Intracatheter Q12H   tamsulosin  0.4 mg Oral QPC supper   Continuous Infusions:  heparin 1,200 Units/hr (03/03/23 2041)   milrinone 0.125 mcg/kg/min (03/03/23 1545)     LOS: 6 days   Time spent: 20 minutes   Noralee Stain, DO [facility] 03/04/2023, 11:07 AM   Available via Epic secure chat 7am-7pm After these hours, please refer to coverage provider listed on [email or web address]

## 2023-03-04 NOTE — Progress Notes (Addendum)
Advanced Heart Failure Rounding Note  PCP-Cardiologist: Nicki Guadalajara, MD   Subjective:    CO-OX 68% on milrinone 0.125. CVP 7-8  Scr 1.6 -> 1.9 -> 1.7 (dapa stopped yesterday)   Denies CP or SOB. Remains anxious about possible VAD>    Objective:   Weight Range: 67.5 kg Body mass index is 21.98 kg/m.   Vital Signs:   Temp:  [97.5 F (36.4 C)-98.7 F (37.1 C)] 97.8 F (36.6 C) (09/08 0825) Pulse Rate:  [63-73] 63 (09/08 0825) Resp:  [16-18] 18 (09/08 0825) BP: (95-110)/(60-73) 95/60 (09/08 0825) SpO2:  [97 %-100 %] 98 % (09/08 0825) Weight:  [67.5 kg] 67.5 kg (09/08 0330) Last BM Date : 03/04/23  Weight change: Filed Weights   03/02/23 0320 03/03/23 0432 03/04/23 0330  Weight: 67.6 kg 66.9 kg 67.5 kg    Intake/Output:   Intake/Output Summary (Last 24 hours) at 03/04/2023 1106 Last data filed at 03/04/2023 0859 Gross per 24 hour  Intake 598.24 ml  Output 2050 ml  Net -1451.76 ml      Physical Exam    General:  Thin male. HEENT: normal Neck: supple. JVP 7-8 Carotids 2+ bilat; no bruits. No lymphadenopathy or thryomegaly appreciated. Cor: PMI laterally displaced. Regular rate & rhythm.+s3 Lungs: clear Abdomen: soft, nontender, nondistended. No hepatosplenomegaly. No bruits or masses. Good bowel sounds. Extremities: no cyanosis, clubbing, rash, edema Neuro: alert & orientedx3, cranial nerves grossly intact. moves all 4 extremities w/o difficulty. Affect pleasant  Telemetry   SR 60s, intermittently Apaced Personally reviewed  Labs    CBC Recent Labs    03/03/23 0454 03/04/23 0624  WBC 7.5 7.7  HGB 14.5 13.9  HCT 42.7 40.8  MCV 90.9 92.7  PLT 176 178   Basic Metabolic Panel Recent Labs    16/10/96 0454 03/04/23 0624  NA 132* 131*  K 4.3 4.4  CL 94* 97*  CO2 24 23  GLUCOSE 121* 96  BUN 37* 35*  CREATININE 1.87* 1.70*  CALCIUM 9.4 8.9  MG 2.5* 2.3   Liver Function Tests No results for input(s): "AST", "ALT", "ALKPHOS", "BILITOT",  "PROT", "ALBUMIN" in the last 72 hours.  No results for input(s): "LIPASE", "AMYLASE" in the last 72 hours. Cardiac Enzymes No results for input(s): "CKTOTAL", "CKMB", "CKMBINDEX", "TROPONINI" in the last 72 hours.  BNP: BNP (last 3 results) Recent Labs    10/15/22 1110 11/21/22 1122 02/26/23 0231  BNP 2,147.0* 914.3* 1,060.3*    ProBNP (last 3 results) Recent Labs    04/18/22 0958  PROBNP 587*     D-Dimer No results for input(s): "DDIMER" in the last 72 hours. Hemoglobin A1C Recent Labs    03/02/23 0500  HGBA1C 6.6*   Fasting Lipid Panel Recent Labs    03/02/23 0500  CHOL 101  HDL 33*  LDLCALC 61  TRIG 35  CHOLHDL 3.1   Thyroid Function Tests Recent Labs    03/02/23 0631  TSH 2.421    Other results:   Imaging    No results found.   Medications:     Scheduled Medications:  amiodarone  200 mg Oral Daily   atorvastatin  80 mg Oral Daily   Chlorhexidine Gluconate Cloth  6 each Topical Daily   multivitamin with minerals  1 tablet Oral Daily   Ensure Max Protein  11 oz Oral BID   sodium chloride flush  10-40 mL Intracatheter Q12H   tamsulosin  0.4 mg Oral QPC supper    Infusions:  heparin 1,200  Units/hr (03/03/23 2041)   milrinone 0.125 mcg/kg/min (03/03/23 1545)    PRN Medications: acetaminophen **OR** acetaminophen, ondansetron **OR** ondansetron (ZOFRAN) IV, mouth rinse, sodium chloride flush    Patient Profile   Steven [name] is a 67 y.o. 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   Admitted with acute on chronic systolic and diastolic CHF with low-output. Advanced Heart Failure consulted and workup started for advanced therapies.    Assessment/Plan    1. 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 CO 3.95. CI 2.15 - NYHA IV on admission - now on Milrinone 0.125 mcg/kg/min to help with renal perfusion. CO-OX 68% CVP 7-8 - GDMT limited by renal function and hypotension. - no ? blocker w/ low output  - Unable to get cMRI due to device mismatch (medtronic generator and a [employer] lead) - VAD w/u underway. Plan to discuss at [facility] tomorrow. Would like to see Scr improved. Avoid hypotension. 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 HS troponin trend flat, not c/w ACS. - on statin  - no ASA given chronic coumadin - no s/s angina   AKI on CKD IIIa - Scr up to 2 this admission, baseline ~1.2 - Scr up 1.6-> 1.9-> 1.7 today - Cont Milrinone to help renal function improve - Hold dapa/ARNI - Avoid hypotension. If BP not improving can move to ICU for NE tomorrow    Mural thrombus - On coumadin PTA, on heparin gtt - no bleeding   H/o VT s/p Medtronic ICD - likely scar mediated - Continue amiodarone 200 mg daily - s/p MDT ICD, last event 8/24 - Keep K>4, Mg >2 - stable   Length of Stay: 6  Arvilla Meres, MD  03/04/2023, 11:06 AM  Advanced Heart Failure Team Pager [phone] (M-F; 7a - 5p)  Please contact [facility] for night-coverage after hours (5p -7a ) and weekends on [email or web address]

## 2023-03-04 NOTE — Consult Note (Signed)
ANTICOAGULATION CONSULT NOTE - Follow-up  Pharmacy Consult for heparin Indication: apical mural thrombus  No Known Allergies  Patient Measurements: Height: 5\' 9"  (175.3 cm) Weight: 67.5 kg (148 lb 13 oz) IBW/kg (Calculated) : 70.7 Heparin Dosing Weight: ~72 kg  Vital Signs: Temp: 97.8 F (36.6 C) (09/08 1350) Temp Source: Oral (09/08 1350) BP: 106/71 (09/08 1350) Pulse Rate: 68 (09/08 1350)  Labs: Recent Labs    03/02/23 0500 03/02/23 0625 03/03/23 0454 03/04/23 0624 03/04/23 1400  HGB  --   --  14.5 13.9  --   HCT  --   --  42.7 40.8  --   PLT  --   --  176 178  --   APTT  --  104*  --   --   --   HEPARINUNFRC 0.40  --  0.59  --  0.51  CREATININE  --  1.64* 1.87* 1.70*  --     Estimated Creatinine Clearance: 39.7 mL/min (A) (by C-G formula based on SCr of 1.7 mg/dL (H)).  Medical History: Past Medical History:  Diagnosis Date   Acute MI, anterior wall (HCC)    AICD (automatic cardioverter/defibrillator) present    CAD (coronary artery disease)    2D ECHO, 07/13/2011 - EF <25%, LV moderatelty dilated, LA moderately dilatedLEXISCAN, 12/14/2011 - moderate-severe perfusion defect seen in the basal anteroseptal, mid anterior, apicacl anterior, apical, apical inferior, and apical lateral regions, post-stress EF 25%, new EKG changes from baseline abnormalities   Cancer [facility])    Prostate   CHF (congestive heart failure) (HCC) 2012   Hypertension 08/08/2021   Inguinal hernia, left    Pneumonia    November 2023   Pre-diabetes     Assessment: 67 yo male with history of HFrEF, CAD, VT, 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  Warfarin started on admission and held for procedures (last dose 9/2).  Pharmacy consulted to dose heparin.  Warfarin therapy held at this admission with anticipation of future procedures. Patient will continue on heparin infusion for now.  Heparin level remains therapeutic on 1200  units/hr  Goal of Therapy:  INR 2-3 Heparin level 0.3-0.7 units/ml Monitor platelets by anticoagulation protocol: Yes   Plan:  Continue heparin gtt at 1200 units/hr Daily heparin level, CBC, s/s bleeding  Daylene Posey, PharmD, [facility] Clinical Pharmacist ED Pharmacist Phone # [phone] 03/04/2023 4:16 PM

## 2023-03-04 NOTE — Progress Notes (Signed)
   03/04/23 0300  BiPAP/CPAP/SIPAP  Reason BIPAP/CPAP not in use Non-compliant   Refused cpap

## 2023-03-04 NOTE — Progress Notes (Signed)
Mobility Specialist Progress Note    03/04/23 0936  Mobility  Activity Ambulated independently in hallway  Level of Assistance Standby assist, set-up cues, supervision of patient - no hands on  Assistive Device None  Distance Ambulated (ft) 470 ft  Activity Response Tolerated well  Mobility Referral Yes  $Mobility charge 1 Mobility  Mobility Specialist Start Time (ACUTE ONLY) 0925  Mobility Specialist Stop Time (ACUTE ONLY) 0935  Mobility Specialist Time Calculation (min) (ACUTE ONLY) 10 min   Pre-Mobility: 73 HR During Mobility: 92 HR Post-Mobility: 83 HR  Pt received in bed and agreeable. No complaints on walk. Returned to bed with call bell in reach.   [place] Mobility Specialist  Please Neurosurgeon or Rehab Office at [phone]

## 2023-03-05 ENCOUNTER — Inpatient Hospital Stay (HOSPITAL_COMMUNITY): Payer: 59

## 2023-03-05 DIAGNOSIS — I5023 Acute on chronic systolic (congestive) heart failure: Secondary | ICD-10-CM | POA: Diagnosis not present

## 2023-03-05 LAB — HEXAGONAL PHASE PHOSPHOLIPID: Hexagonal Phase Phospholipid: 4 s (ref 0–11)

## 2023-03-05 LAB — BASIC METABOLIC PANEL
Anion gap: 8 (ref 5–15)
BUN: 37 mg/dL — ABNORMAL HIGH (ref 8–23)
CO2: 22 mmol/L (ref 22–32)
Calcium: 8.8 mg/dL — ABNORMAL LOW (ref 8.9–10.3)
Chloride: 101 mmol/L (ref 98–111)
Creatinine, Ser: 1.61 mg/dL — ABNORMAL HIGH (ref 0.61–1.24)
GFR, Estimated: 46 mL/min — ABNORMAL LOW (ref >60)
Glucose, Bld: 95 mg/dL (ref 70–99)
Potassium: 4.4 mmol/L (ref 3.5–5.1)
Sodium: 131 mmol/L — ABNORMAL LOW (ref 135–145)

## 2023-03-05 LAB — CBC
HCT: 39.1 % (ref 39.0–52.0)
Hemoglobin: 13.4 g/dL (ref 13.0–17.0)
MCH: 31.9 pg (ref 26.0–34.0)
MCHC: 34.3 g/dL (ref 30.0–36.0)
MCV: 93.1 fL (ref 80.0–100.0)
Platelets: 168 10*3/uL (ref 150–400)
RBC: 4.2 MIL/uL — ABNORMAL LOW (ref 4.22–5.81)
RDW: 13.1 % (ref 11.5–15.5)
WBC: 8.5 10*3/uL (ref 4.0–10.5)
nRBC: 0 % (ref 0.0–0.2)

## 2023-03-05 LAB — LUPUS ANTICOAGULANT PANEL
DRVVT: 48.4 s — ABNORMAL HIGH (ref 0.0–47.0)
PTT Lupus Anticoagulant: 58.5 s — ABNORMAL HIGH (ref 0.0–43.5)

## 2023-03-05 LAB — HEPARIN LEVEL (UNFRACTIONATED): Heparin Unfractionated: 0.45 [IU]/mL (ref 0.30–0.70)

## 2023-03-05 LAB — PTT-LA MIX: PTT-LA Mix: 51.7 s — ABNORMAL HIGH (ref 0.0–40.5)

## 2023-03-05 LAB — MAGNESIUM: Magnesium: 2.2 mg/dL (ref 1.7–2.4)

## 2023-03-05 LAB — MRSA NEXT GEN BY PCR, NASAL: MRSA by PCR Next Gen: NOT DETECTED

## 2023-03-05 LAB — COOXEMETRY PANEL
Carboxyhemoglobin: 1.9 % — ABNORMAL HIGH (ref 0.5–1.5)
Methemoglobin: <0.7 % (ref 0.0–1.5)
O2 Saturation: 71 %
Total hemoglobin: 13.7 g/dL (ref 12.0–16.0)

## 2023-03-05 LAB — DRVVT MIX: dRVVT Mix: 39.4 s (ref 0.0–40.4)

## 2023-03-05 MED ORDER — SODIUM CHLORIDE 0.9 % IV SOLN: INTRAVENOUS | Status: DC

## 2023-03-05 MED ORDER — NOREPINEPHRINE 4 MG/250ML-% IV SOLN
2.0000 ug/min | INTRAVENOUS | Status: DC
  Administered 2023-03-05 – 2023-03-07 (×2): 2 ug/min via INTRAVENOUS
  Filled 2023-03-05 (×2): qty 250

## 2023-03-05 MED ORDER — ASPIRIN 81 MG PO CHEW: 81.0000 mg | CHEWABLE_TABLET | ORAL | Status: DC

## 2023-03-05 NOTE — Progress Notes (Signed)
Mobility Specialist Progress Note:    03/05/23 1052  Mobility  Activity Ambulated with assistance in hallway  Level of Assistance Standby assist, set-up cues, supervision of patient - no hands on  Assistive Device None  Distance Ambulated (ft) 600 ft  Activity Response Tolerated well  Mobility Referral Yes  $Mobility charge 1 Mobility  Mobility Specialist Start Time (ACUTE ONLY) 1017  Mobility Specialist Stop Time (ACUTE ONLY) 1027  Mobility Specialist Time Calculation (min) (ACUTE ONLY) 10 min   Received pt supine in bed having no complaints and agreeable to mobility. Pt was asymptomatic throughout ambulation and returned to room w/o fault. Left seated EOB w/ call bell in reach and all needs met.   [place] Mobility Specialist  Please contact vis Secure Chat or  Rehab Office [phone]

## 2023-03-05 NOTE — Plan of Care (Signed)
  Problem: Activity: Goal: Risk for activity intolerance will decrease Outcome: Progressing   

## 2023-03-05 NOTE — Progress Notes (Signed)
Advanced Heart Failure Rounding Note  PCP-Cardiologist: Nicki Guadalajara, MD   Subjective:    Remains on milrinone 0.125. Co-ox 71%.   Scr 1.6 -> 1.9 -> 1.7-> 1.6   SBPs 90s-low 100s.   Good UOP. 2.8 L out yesterday. CVP ~4   Feels ok today. Denies any current resting dyspnea. No chest pain.   Objective:   Weight Range: 67.5 kg Body mass index is 21.98 kg/m.   Vital Signs:   Temp:  [97.8 F (36.6 C)-97.9 F (36.6 C)] 97.8 F (36.6 C) (09/09 0854) Pulse Rate:  [62-68] 66 (09/09 0854) Resp:  [18] 18 (09/09 0402) BP: (91-106)/(58-71) 103/67 (09/09 0854) SpO2:  [97 %-100 %] 98 % (09/09 0854) Last BM Date : 03/04/23  Weight change: Filed Weights   03/02/23 0320 03/03/23 0432 03/04/23 0330  Weight: 67.6 kg 66.9 kg 67.5 kg    Intake/Output:   Intake/Output Summary (Last 24 hours) at 03/05/2023 0916 Last data filed at 03/05/2023 0404 Gross per 24 hour  Intake 240 ml  Output 2451 ml  Net -2211 ml      Physical Exam    General:  Well appearing. No respiratory difficulty HEENT: normal Neck: supple. no JVD. Carotids 2+ bilat; no bruits. No lymphadenopathy or thyromegaly appreciated. Cor: PMI nondisplaced. Regular rate & rhythm. No rubs, gallops or murmurs. Lungs: clear Abdomen: soft, nontender, nondistended. No hepatosplenomegaly. No bruits or masses. Good bowel sounds. Extremities: no cyanosis, clubbing, rash, edema + RUE PICC  Neuro: alert & oriented x 3, cranial nerves grossly intact. moves all 4 extremities w/o difficulty. Affect pleasant.   Telemetry   NSR 60s  Personally reviewed  Labs    CBC Recent Labs    03/04/23 0624 03/05/23 0451  WBC 7.7 8.5  HGB 13.9 13.4  HCT 40.8 39.1  MCV 92.7 93.1  PLT 178 168   Basic Metabolic Panel Recent Labs    16/10/96 0624 03/05/23 0451  NA 131* 131*  K 4.4 4.4  CL 97* 101  CO2 23 22  GLUCOSE 96 95  BUN 35* 37*  CREATININE 1.70* 1.61*  CALCIUM 8.9 8.8*  MG 2.3 2.2   Liver Function Tests No results  for input(s): "AST", "ALT", "ALKPHOS", "BILITOT", "PROT", "ALBUMIN" in the last 72 hours.  No results for input(s): "LIPASE", "AMYLASE" in the last 72 hours. Cardiac Enzymes No results for input(s): "CKTOTAL", "CKMB", "CKMBINDEX", "TROPONINI" in the last 72 hours.  BNP: BNP (last 3 results) Recent Labs    10/15/22 1110 11/21/22 1122 02/26/23 0231  BNP 2,147.0* 914.3* 1,060.3*    ProBNP (last 3 results) Recent Labs    04/18/22 0958  PROBNP 587*     D-Dimer No results for input(s): "DDIMER" in the last 72 hours. Hemoglobin A1C No results for input(s): "HGBA1C" in the last 72 hours.  Fasting Lipid Panel No results for input(s): "CHOL", "HDL", "LDLCALC", "TRIG", "CHOLHDL", "LDLDIRECT" in the last 72 hours.  Thyroid Function Tests No results for input(s): "TSH", "T4TOTAL", "T3FREE", "THYROIDAB" in the last 72 hours.  Invalid input(s): "FREET3"   Other results:   Imaging    No results found.   Medications:     Scheduled Medications:  amiodarone  200 mg Oral Daily   atorvastatin  80 mg Oral Daily   Chlorhexidine Gluconate Cloth  6 each Topical Daily   feeding supplement  237 mL Oral TID BM   multivitamin with minerals  1 tablet Oral Daily   Ensure Max Protein  11 oz Oral BID  sodium chloride flush  10-40 mL Intracatheter Q12H   tamsulosin  0.4 mg Oral QPC supper    Infusions:  heparin 1,200 Units/hr (03/04/23 1841)   milrinone 0.125 mcg/kg/min (03/05/23 0527)    PRN Medications: acetaminophen **OR** acetaminophen, ondansetron **OR** ondansetron (ZOFRAN) IV, mouth rinse, sodium chloride flush    Patient Profile   Steven [name] is a 67 y.o. 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   Admitted with acute on chronic systolic and diastolic CHF with low-output. Advanced Heart Failure consulted and workup started for advanced  therapies.    Assessment/Plan    1. 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 CO 3.95. CI 2.15 - NYHA IV on admission - now on Milrinone 0.125 mcg/kg/min to help with renal perfusion. CO-OX 71% CVP ~4 . Hold diuretics today  - GDMT limited by renal function and hypotension. - no ? blocker w/ low output  - Unable to get cMRI due to device mismatch (medtronic generator and a [employer] lead) - VAD w/u underway. Plan to discuss at [facility] today. Would like to see Scr improved. If approved for VAD, will move to ICU for NE to help w/ renal fx and BP.    CAD - h/o large anterior MI 2013 treated w/ DES to LAD  - No CP. HS troponin trend flat, not c/w ACS. - on statin  - no ASA given chronic coumadin - no s/s angina   AKI on CKD IIIa - Scr up to 2 this admission, baseline ~1.2 - Scr up 1.6-> 1.9-> 1.7->1.6 today - Cont Milrinone to help renal function improve - Hold dapa/ARNI - Avoid hypotension. If BP not improving can move to ICU for NE    Mural thrombus - On coumadin PTA, on heparin gtt - no bleeding   H/o VT s/p Medtronic ICD - likely scar mediated - Continue amiodarone 200 mg daily - s/p MDT ICD, last event 8/24 - Keep K>4, Mg >2 - stable   Length of Stay: [address] PA-C  03/05/2023, 9:16 AM  Advanced Heart Failure Team Pager [phone] (M-F; 7a - 5p)  Please contact [facility] for night-coverage after hours (5p -7a ) and weekends on [email or web address]

## 2023-03-05 NOTE — Progress Notes (Signed)
H&V Care Navigation CSW Progress Note  Outpatient Heart Failure CSW met with pt to follow up on financial concerns expressed during LVAD assessment.  Pt reports he was able to speak with his work on Friday and they confirmed he could get STD benefits and will fax the paperwork to our office.  CSW confirmed with staff that we received this.  This should help patient be financially stable for at least 3 months post surgery.  Unclear if patient will be safe to return to work.   Given the physical nature of his job I encouraged him to discuss with him employer if there would be light duty options or less physical job roles he could transition to so that he is more likely to be able to return to work after LVAD implant as he is financially dependent on employment.   CSW will continue to follow and assist patients as needed.  SDOH Screenings   Food Insecurity: No Food Insecurity (02/26/2023)  Housing: High Risk (02/26/2023)  Transportation Needs: No Transportation Needs (02/26/2023)  Utilities: Not At Risk (02/26/2023)  Alcohol Screen: Low Risk  (05/08/2022)  Financial Resource Strain: Low Risk  (05/08/2022)  Social Connections: Unknown (11/08/2021)   Received from [facility]  Tobacco Use: Medium Risk (02/26/2023)   Burna Sis, LCSW Clinical Social Worker Advanced Heart Failure Clinic Desk#: [phone] Cell#: [phone]

## 2023-03-05 NOTE — H&P (View-Only) (Signed)
Advanced Heart Failure Rounding Note  PCP-Cardiologist: Nicki Guadalajara, MD   Subjective:    Remains on milrinone 0.125. Co-ox 71%.   Scr 1.6 -> 1.9 -> 1.7-> 1.6   SBPs 90s-low 100s.   Good UOP. 2.8 L out yesterday. CVP ~4   Feels ok today. Denies any current resting dyspnea. No chest pain.   Objective:   Weight Range: 67.5 kg Body mass index is 21.98 kg/m.   Vital Signs:   Temp:  [97.8 F (36.6 C)-97.9 F (36.6 C)] 97.8 F (36.6 C) (09/09 0854) Pulse Rate:  [62-68] 66 (09/09 0854) Resp:  [18] 18 (09/09 0402) BP: (91-106)/(58-71) 103/67 (09/09 0854) SpO2:  [97 %-100 %] 98 % (09/09 0854) Last BM Date : 03/04/23  Weight change: Filed Weights   03/02/23 0320 03/03/23 0432 03/04/23 0330  Weight: 67.6 kg 66.9 kg 67.5 kg    Intake/Output:   Intake/Output Summary (Last 24 hours) at 03/05/2023 0916 Last data filed at 03/05/2023 0404 Gross per 24 hour  Intake 240 ml  Output 2451 ml  Net -2211 ml      Physical Exam    General:  Well appearing. No respiratory difficulty HEENT: normal Neck: supple. no JVD. Carotids 2+ bilat; no bruits. No lymphadenopathy or thyromegaly appreciated. Cor: PMI nondisplaced. Regular rate & rhythm. No rubs, gallops or murmurs. Lungs: clear Abdomen: soft, nontender, nondistended. No hepatosplenomegaly. No bruits or masses. Good bowel sounds. Extremities: no cyanosis, clubbing, rash, edema + RUE PICC  Neuro: alert & oriented x 3, cranial nerves grossly intact. moves all 4 extremities w/o difficulty. Affect pleasant.   Telemetry   NSR 60s  Personally reviewed  Labs    CBC Recent Labs    03/04/23 0624 03/05/23 0451  WBC 7.7 8.5  HGB 13.9 13.4  HCT 40.8 39.1  MCV 92.7 93.1  PLT 178 168   Basic Metabolic Panel Recent Labs    16/10/96 0624 03/05/23 0451  NA 131* 131*  K 4.4 4.4  CL 97* 101  CO2 23 22  GLUCOSE 96 95  BUN 35* 37*  CREATININE 1.70* 1.61*  CALCIUM 8.9 8.8*  MG 2.3 2.2   Liver Function Tests No results  for input(s): "AST", "ALT", "ALKPHOS", "BILITOT", "PROT", "ALBUMIN" in the last 72 hours.  No results for input(s): "LIPASE", "AMYLASE" in the last 72 hours. Cardiac Enzymes No results for input(s): "CKTOTAL", "CKMB", "CKMBINDEX", "TROPONINI" in the last 72 hours.  BNP: BNP (last 3 results) Recent Labs    10/15/22 1110 11/21/22 1122 02/26/23 0231  BNP 2,147.0* 914.3* 1,060.3*    ProBNP (last 3 results) Recent Labs    04/18/22 0958  PROBNP 587*     D-Dimer No results for input(s): "DDIMER" in the last 72 hours. Hemoglobin A1C No results for input(s): "HGBA1C" in the last 72 hours.  Fasting Lipid Panel No results for input(s): "CHOL", "HDL", "LDLCALC", "TRIG", "CHOLHDL", "LDLDIRECT" in the last 72 hours.  Thyroid Function Tests No results for input(s): "TSH", "T4TOTAL", "T3FREE", "THYROIDAB" in the last 72 hours.  Invalid input(s): "FREET3"   Other results:   Imaging    No results found.   Medications:     Scheduled Medications:  amiodarone  200 mg Oral Daily   atorvastatin  80 mg Oral Daily   Chlorhexidine Gluconate Cloth  6 each Topical Daily   feeding supplement  237 mL Oral TID BM   multivitamin with minerals  1 tablet Oral Daily   Ensure Max Protein  11 oz Oral BID  sodium chloride flush  10-40 mL Intracatheter Q12H   tamsulosin  0.4 mg Oral QPC supper    Infusions:  heparin 1,200 Units/hr (03/04/23 1841)   milrinone 0.125 mcg/kg/min (03/05/23 0527)    PRN Medications: acetaminophen **OR** acetaminophen, ondansetron **OR** ondansetron (ZOFRAN) IV, mouth rinse, sodium chloride flush    Patient Profile   Steven [name] is a 67 y.o. 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   Admitted with acute on chronic systolic and diastolic CHF with low-output. Advanced Heart Failure consulted and workup started for advanced  therapies.    Assessment/Plan    1. 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 CO 3.95. CI 2.15 - NYHA IV on admission - now on Milrinone 0.125 mcg/kg/min to help with renal perfusion. CO-OX 71% CVP ~4 . Hold diuretics today  - GDMT limited by renal function and hypotension. - no ? blocker w/ low output  - Unable to get cMRI due to device mismatch (medtronic generator and a [employer] lead) - VAD w/u underway. Plan to discuss at [facility] today. Would like to see Scr improved. If approved for VAD, will move to ICU for NE to help w/ renal fx and BP.    CAD - h/o large anterior MI 2013 treated w/ DES to LAD  - No CP. HS troponin trend flat, not c/w ACS. - on statin  - no ASA given chronic coumadin - no s/s angina   AKI on CKD IIIa - Scr up to 2 this admission, baseline ~1.2 - Scr up 1.6-> 1.9-> 1.7->1.6 today - Cont Milrinone to help renal function improve - Hold dapa/ARNI - Avoid hypotension. If BP not improving can move to ICU for NE    Mural thrombus - On coumadin PTA, on heparin gtt - no bleeding   H/o VT s/p Medtronic ICD - likely scar mediated - Continue amiodarone 200 mg daily - s/p MDT ICD, last event 8/24 - Keep K>4, Mg >2 - stable   Length of Stay: [address] PA-C  03/05/2023, 9:16 AM  Advanced Heart Failure Team Pager [phone] (M-F; 7a - 5p)  Please contact [facility] for night-coverage after hours (5p -7a ) and weekends on [email or web address]

## 2023-03-05 NOTE — Consult Note (Signed)
ANTICOAGULATION CONSULT NOTE - Follow-up  Pharmacy Consult for heparin Indication: apical mural thrombus  No Known Allergies  Patient Measurements: Height: 5\' 9"  (175.3 cm) Weight: 67.5 kg (148 lb 13 oz) IBW/kg (Calculated) : 70.7 Heparin Dosing Weight: ~72 kg  Vital Signs: Temp: 97.8 F (36.6 C) (09/09 0854) Temp Source: Oral (09/09 0854) BP: 103/67 (09/09 0854) Pulse Rate: 66 (09/09 0854)  Labs: Recent Labs    03/03/23 0454 03/04/23 0624 03/04/23 1400 03/05/23 0451  HGB 14.5 13.9  --  13.4  HCT 42.7 40.8  --  39.1  PLT 176 178  --  168  HEPARINUNFRC 0.59  --  0.51 0.45  CREATININE 1.87* 1.70*  --  1.61*    Estimated Creatinine Clearance: 41.9 mL/min (A) (by C-G formula based on SCr of 1.61 mg/dL (H)).  Medical History: Past Medical History:  Diagnosis Date   Acute MI, anterior wall (HCC)    AICD (automatic cardioverter/defibrillator) present    CAD (coronary artery disease)    2D ECHO, 07/13/2011 - EF <25%, LV moderatelty dilated, LA moderately dilatedLEXISCAN, 12/14/2011 - moderate-severe perfusion defect seen in the basal anteroseptal, mid anterior, apicacl anterior, apical, apical inferior, and apical lateral regions, post-stress EF 25%, new EKG changes from baseline abnormalities   Cancer [facility])    Prostate   CHF (congestive heart failure) (HCC) 2012   Hypertension 08/08/2021   Inguinal hernia, left    Pneumonia    November 2023   Pre-diabetes     Assessment: 67 yo male with history of HFrEF, CAD, VT, 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  Warfarin started on admission and held for procedures (last dose 9/2).  Pharmacy consulted to dose heparin.  Warfarin therapy held at this admission with anticipation of future procedures. Patient will continue on heparin infusion for now. Heparin level is therapeutic at 0.45, on 1200 units/hr. Hgb 13.4, plt 168. No s/sx of bleeding or infusion issues.   Goal  of Therapy:  INR 2-3 Heparin level 0.3-0.7 units/ml Monitor platelets by anticoagulation protocol: Yes   Plan:  Continue heparin gtt at 1200 units/hr Daily heparin level, CBC, s/s bleeding  Thank you for allowing pharmacy to participate in this patient's care,  Sherron Monday, PharmD, BCCCP Clinical Pharmacist  Phone: [phone] 03/05/2023 9:41 AM  Please check AMION for all [facility] phone numbers After 10:00 PM, call Main Pharmacy [phone]

## 2023-03-05 NOTE — Progress Notes (Addendum)
PROGRESS NOTE    Steven [name]  AOZ:[id number] DOB: 04/21/1956 DOA: 02/26/2023 PCP: [name], MD     Brief Narrative:  Steven [name] is a 67 y.o. male with past medical history significant for chronic systolic congestive heart failure (LVEF <20%), ischemic cardiomyopathy, history of LV thrombus, history of ventricular tachycardia s/p ICD, CKD stage IIIb, hyperlipidemia, hypertension who presented to [facility] ED on 9/2 from home via EMS with acute onset shortness of breath.  Also associated with nausea.  Dyspnea worse with exertion.  Denies lower extremity edema but endorses orthopnea/PND.  Patient was recently seen in cardiology outpatient clinic, his torsemide was decreased to 20 mg every other day due to decreased GFR.  Patient reports compliance with his medications and adherence to a salt/fluid restricted diet.  Patient denies chest pain, no abdominal pain. On EMS arrival patient was noted to be tachypneic with elevated JVD and SpO2 78% on room air.  Patient was given nitroglycerin, placed on CPAP and transported to the ED for further evaluation.   In the ED, temperature 96.8 F, HR 85, RR 31, BP 127/66, SpO2 92% on BiPAP.  VBG with pH 7.280, pCO2 40.5, pO2 112.  WBC 9.9, hemoglobin 16.3, platelet count 225.  Sodium 135, potassium 4.0, chloride 108, CO2 18, glucose 166, BUN 38, creatinine 1.89.  AST 70, ALT 46, total bilirubin 0.8.  High sensitive troponin 7 followed by 13.  BNP 1060.3.  SARS Cov 2 PCR negative.  Chest x-ray with severe diffuse bilateral airspace disease worsening on the right and improving on left, reflective of edema versus infection.  Due to heart failure, cardiology, advanced heart failure team were consulted.  He underwent heart cath.  Patient was started on milrinone drip.  Currently undergoing evaluation for LVAD  New events last 24 hours / Subjective: No acute events.  Breathing well  Assessment & Plan:   Principal Problem:   Acute on chronic systolic  congestive heart failure (HCC) Active Problems:   History of VT   CAD (coronary artery disease)   AF (atrial fibrillation) (HCC)   Hypertension   CKD stage 3a, GFR 45-59 ml/min (HCC)   Malignant neoplasm of prostate (HCC)   Alcohol abuse   Goals of care, counseling/discussion   Acute on chronic congestive heart failure (HCC)   Acute respiratory failure with hypoxia (HCC)     Acute hypoxic/hypercapnic respiratory failure, POA Acute on chronic combined systolic and diastolic heart failure Pulmonary edema Patient presenting with acute onset shortness of breath 2 hours prior to arrival to the ED.  Patient was found tachypneic and hypoxic with SpO2 78% on room air.  Not oxygen dependent at baseline.  Recently had dose reduction of his torsemide outpatient due to worsening renal function.  Patient states he hears to salt/fluid restricted diet.  Patient was on BiPAP on arrival with a VBG with elevated pCO2 of 40.5.  Chest x-ray with findings consistent with pulmonary edema.  COVID PCR negative. TTE with LVEF less than 20%, grade 2 diastolic dysfunction, previous LV thrombus not visualized, IVC dilated, RVSP 55.4 mmHg. -- Cardiology consulted -- Status post heart cath -- Now on milrinone -- Working up for LVAD   Transaminitis: Resolved LFTs elevated, likely secondary to hepatic congestion in the setting of CHF exacerbation as above   History of LV thrombus Repeat TTE shows no LV thrombus. -- IV heparin   History of ventricular tachycardia s/p ICD -- Amiodarone    Paroxysmal atrial fibrillation -- IV  heparin   AKI on CKD stage IIIb Baseline creatinine 1.3-1.7.  Creatinine 1.89 on admission, trended up to 2.06.  Etiology likely secondary to volume overload versus ATN with poor perfusion due to low output cardiomyopathy.   CAD Ischemic cardiomyopathy -- Continue statin   Essential hypertension -- Spironolactone on hold   Hyperlipidemia -- Atorvastatin   Malignant neoplasm of  prostate -- Continue outpatient follow-up with oncology   DVT prophylaxis: IV heparin SCDs Start: 02/26/23 1502  Code Status: Full code Family Communication: None Disposition Plan: Home Status is: Inpatient Remains inpatient appropriate because: IV therapies, advanced heart failure team    Antimicrobials:  Anti-infectives (From admission, onward)    None        Objective: Vitals:   03/04/23 1920 03/05/23 0011 03/05/23 0402 03/05/23 0854  BP: 105/64 91/61 (!) 92/58 103/67  Pulse: 63 65 62 66  Resp: 18 18 18    Temp: 97.8 F (36.6 C) 97.8 F (36.6 C) 97.8 F (36.6 C) 97.8 F (36.6 C)  TempSrc: Oral Oral Oral Oral  SpO2: 100% 97% 97% 98%  Weight:      Height:        Intake/Output Summary (Last 24 hours) at 03/05/2023 1042 Last data filed at 03/05/2023 1039 Gross per 24 hour  Intake 240 ml  Output 2851 ml  Net -2611 ml   Filed Weights   03/02/23 0320 03/03/23 0432 03/04/23 0330  Weight: 67.6 kg 66.9 kg 67.5 kg   Examination: General exam: Appears calm and comfortable  Respiratory system: Clear to auscultation. Respiratory effort normal.  On room air Cardiovascular system: S1 & S2 heard, RRR. Gastrointestinal system: Abdomen is nondistended, soft and nontender. Normal bowel sounds heard. Central nervous system: Alert and oriented. Non focal exam. Speech clear  Extremities: Symmetric in appearance bilaterally  Skin: No rashes, lesions or ulcers on exposed skin  Psychiatry: Judgement and insight appear stable. Mood & affect appropriate.     Data Reviewed: I have personally reviewed following labs and imaging studies  CBC: Recent Labs  Lab 02/27/23 0416 02/28/23 0325 02/28/23 1322 02/28/23 1323 03/03/23 0454 03/04/23 0624 03/05/23 0451  WBC 10.3 9.2  --   --  7.5 7.7 8.5  HGB 13.8 13.4 14.3 14.3 14.5 13.9 13.4  HCT 41.9 40.5 42.0 42.0 42.7 40.8 39.1  MCV 93.3 93.3  --   --  90.9 92.7 93.1  PLT 138* 141*  --   --  176 178 168   Basic Metabolic  Panel: Recent Labs  Lab 03/01/23 0322 03/02/23 0625 03/03/23 0454 03/04/23 0624 03/05/23 0451  NA 130* 132* 132* 131* 131*  K 3.9 3.9 4.3 4.4 4.4  CL 98 98 94* 97* 101  CO2 20* 22 24 23 22   GLUCOSE 104* 115* 121* 96 95  BUN 44* 34* 37* 35* 37*  CREATININE 1.95* 1.64* 1.87* 1.70* 1.61*  CALCIUM 8.3* 8.7* 9.4 8.9 8.8*  MG 2.3 2.3 2.5* 2.3 2.2   GFR: Estimated Creatinine Clearance: 41.9 mL/min (A) (by C-G formula based on SCr of 1.61 mg/dL (H)). Liver Function Tests: Recent Labs  Lab 02/28/23 0325  AST 24  ALT 24  ALKPHOS 60  BILITOT 2.1*  PROT 6.7  ALBUMIN 3.2*   No results for input(s): "LIPASE", "AMYLASE" in the last 168 hours. No results for input(s): "AMMONIA" in the last 168 hours. Coagulation Profile: Recent Labs  Lab 02/27/23 0416 02/28/23 0325 03/01/23 0322  INR 2.9* 2.5* 1.7*   Cardiac Enzymes: No results for input(s): "CKTOTAL", "  CKMB", "CKMBINDEX", "TROPONINI" in the last 168 hours. BNP (last 3 results) Recent Labs    04/18/22 0958  PROBNP 587*   HbA1C: No results for input(s): "HGBA1C" in the last 72 hours.  CBG: No results for input(s): "GLUCAP" in the last 168 hours. Lipid Profile: No results for input(s): "CHOL", "HDL", "LDLCALC", "TRIG", "CHOLHDL", "LDLDIRECT" in the last 72 hours.  Thyroid Function Tests: No results for input(s): "TSH", "T4TOTAL", "FREET4", "T3FREE", "THYROIDAB" in the last 72 hours.  Anemia Panel: No results for input(s): "VITAMINB12", "FOLATE", "FERRITIN", "TIBC", "IRON", "RETICCTPCT" in the last 72 hours. Sepsis Labs: Recent Labs  Lab 02/26/23 1215 02/26/23 1615  LATICACIDVEN 1.7 1.9    Recent Results (from the past 240 hour(s))  SARS Coronavirus 2 by RT PCR (hospital order, performed in [facility] hospital lab) *cepheid single result test* Anterior Nasal Swab     Status: None   Collection Time: 02/26/23  2:25 AM   Specimen: Anterior Nasal Swab  Result Value Ref Range Status   SARS Coronavirus 2 by RT PCR  NEGATIVE NEGATIVE Final    Comment: Performed at [facility], [address], [place], [place] [address]      Radiology Studies: No results found.    Scheduled Meds:  amiodarone  200 mg Oral Daily   atorvastatin  80 mg Oral Daily   Chlorhexidine Gluconate Cloth  6 each Topical Daily   feeding supplement  237 mL Oral TID BM   multivitamin with minerals  1 tablet Oral Daily   sodium chloride flush  10-40 mL Intracatheter Q12H   tamsulosin  0.4 mg Oral QPC supper   Continuous Infusions:  heparin 1,200 Units/hr (03/04/23 1841)   milrinone 0.125 mcg/kg/min (03/05/23 0527)     LOS: 7 days   Time spent: 20 minutes   Noralee Stain, DO [facility] 03/05/2023, 10:42 AM   Available via Epic secure chat 7am-7pm After these hours, please refer to coverage provider listed on [email or web address]

## 2023-03-06 ENCOUNTER — Encounter (HOSPITAL_COMMUNITY)
Admission: EM | Disposition: A | Discharge status: Rehabilitation Facility | Payer: Self-pay | Source: Home / Self Care | Attending: Cardiology

## 2023-03-06 ENCOUNTER — Ambulatory Visit: Payer: 59 | Admitting: Cardiovascular Disease

## 2023-03-06 DIAGNOSIS — I255 Ischemic cardiomyopathy: Secondary | ICD-10-CM | POA: Diagnosis not present

## 2023-03-06 DIAGNOSIS — I5023 Acute on chronic systolic (congestive) heart failure: Secondary | ICD-10-CM | POA: Diagnosis not present

## 2023-03-06 DIAGNOSIS — I482 Chronic atrial fibrillation, unspecified: Secondary | ICD-10-CM | POA: Diagnosis not present

## 2023-03-06 HISTORY — PX: RIGHT HEART CATH: CATH118263

## 2023-03-06 LAB — POCT I-STAT EG7
Acid-base deficit: 3 mmol/L — ABNORMAL HIGH (ref 0.0–2.0)
Acid-base deficit: 3 mmol/L — ABNORMAL HIGH (ref 0.0–2.0)
Bicarbonate: 21.7 mmol/L (ref 20.0–28.0)
Bicarbonate: 21.6 mmol/L (ref 20.0–28.0)
Calcium, Ion: 1.24 mmol/L (ref 1.15–1.40)
Calcium, Ion: 1.21 mmol/L (ref 1.15–1.40)
HCT: 44 % (ref 39.0–52.0)
HCT: 43 % (ref 39.0–52.0)
Hemoglobin: 15 g/dL (ref 13.0–17.0)
Hemoglobin: 14.6 g/dL (ref 13.0–17.0)
O2 Saturation: 65 %
O2 Saturation: 68 %
Potassium: 4.6 mmol/L (ref 3.5–5.1)
Potassium: 4.5 mmol/L (ref 3.5–5.1)
Sodium: 134 mmol/L — ABNORMAL LOW (ref 135–145)
Sodium: 134 mmol/L — ABNORMAL LOW (ref 135–145)
TCO2: 23 mmol/L (ref 22–32)
TCO2: 23 mmol/L (ref 22–32)
pCO2, Ven: 35.7 mmHg — ABNORMAL LOW (ref 44–60)
pCO2, Ven: 35.5 mmHg — ABNORMAL LOW (ref 44–60)
pH, Ven: 7.391 (ref 7.25–7.43)
pH, Ven: 7.393 (ref 7.25–7.43)
pO2, Ven: 34 mmHg (ref 32–45)
pO2, Ven: 36 mmHg (ref 32–45)

## 2023-03-06 LAB — BASIC METABOLIC PANEL
Anion gap: 8 (ref 5–15)
BUN: 38 mg/dL — ABNORMAL HIGH (ref 8–23)
CO2: 21 mmol/L — ABNORMAL LOW (ref 22–32)
Calcium: 8.7 mg/dL — ABNORMAL LOW (ref 8.9–10.3)
Chloride: 99 mmol/L (ref 98–111)
Creatinine, Ser: 1.52 mg/dL — ABNORMAL HIGH (ref 0.61–1.24)
GFR, Estimated: 50 mL/min — ABNORMAL LOW (ref >60)
Glucose, Bld: 161 mg/dL — ABNORMAL HIGH (ref 70–99)
Potassium: 4.4 mmol/L (ref 3.5–5.1)
Sodium: 128 mmol/L — ABNORMAL LOW (ref 135–145)

## 2023-03-06 LAB — COOXEMETRY PANEL
Carboxyhemoglobin: 0.8 % (ref 0.5–1.5)
Methemoglobin: <0.7 % (ref 0.0–1.5)
O2 Saturation: 62.3 %
Total hemoglobin: 14.8 g/dL (ref 12.0–16.0)

## 2023-03-06 LAB — CBC
HCT: 42.8 % (ref 39.0–52.0)
Hemoglobin: 14.3 g/dL (ref 13.0–17.0)
MCH: 30.9 pg (ref 26.0–34.0)
MCHC: 33.4 g/dL (ref 30.0–36.0)
MCV: 92.4 fL (ref 80.0–100.0)
Platelets: 204 10*3/uL (ref 150–400)
RBC: 4.63 MIL/uL (ref 4.22–5.81)
RDW: 13.2 % (ref 11.5–15.5)
WBC: 10.6 10*3/uL — ABNORMAL HIGH (ref 4.0–10.5)
nRBC: 0 % (ref 0.0–0.2)

## 2023-03-06 LAB — HEPARIN LEVEL (UNFRACTIONATED): Heparin Unfractionated: 0.52 [IU]/mL (ref 0.30–0.70)

## 2023-03-06 LAB — MAGNESIUM: Magnesium: 2.2 mg/dL (ref 1.7–2.4)

## 2023-03-06 LAB — PROTIME-INR
INR: 1.2 (ref 0.8–1.2)
Prothrombin Time: 15.3 s — ABNORMAL HIGH (ref 11.4–15.2)

## 2023-03-06 SURGERY — RIGHT HEART CATH
Anesthesia: LOCAL

## 2023-03-06 MED ORDER — ASPIRIN 81 MG PO CHEW
81.0000 mg | CHEWABLE_TABLET | Freq: Once | ORAL | Status: AC
  Administered 2023-03-06: 81 mg via ORAL
  Filled 2023-03-06: qty 1

## 2023-03-06 MED ORDER — LIDOCAINE HCL (PF) 1 % IJ SOLN
INTRAMUSCULAR | Status: AC
  Filled 2023-03-06: qty 30

## 2023-03-06 MED ORDER — HEPARIN (PORCINE) 25000 UT/250ML-% IV SOLN
1200.0000 [IU]/h | INTRAVENOUS | Status: DC
  Administered 2023-03-06 – 2023-03-07 (×2): 1200 [IU]/h via INTRAVENOUS
  Filled 2023-03-06 (×2): qty 250

## 2023-03-06 MED ORDER — LIDOCAINE HCL (PF) 1 % IJ SOLN
INTRAMUSCULAR | Status: DC | PRN
  Administered 2023-03-06: 5 mL

## 2023-03-06 MED ORDER — HEPARIN (PORCINE) IN NACL 1000-0.9 UT/500ML-% IV SOLN
INTRAVENOUS | Status: DC | PRN
  Administered 2023-03-06: 500 mL

## 2023-03-06 SURGICAL SUPPLY — 6 items
CATH SWAN GANZ 7F STRAIGHT (CATHETERS) IMPLANT
PACK CARDIAC CATHETERIZATION (CUSTOM PROCEDURE TRAY) ×1 IMPLANT
SHEATH PINNACLE 7F 10CM (SHEATH) IMPLANT
SHEATH PROBE COVER 6X72 (BAG) IMPLANT
TRANSDUCER W/STOPCOCK (MISCELLANEOUS) IMPLANT
TUBING ART PRESS 72 MALE/FEM (TUBING) IMPLANT

## 2023-03-06 NOTE — Progress Notes (Signed)
Advanced Heart Failure Rounding Note  PCP-Cardiologist: Nicki Guadalajara, MD   Subjective:    Approved at [facility]. Planning VAD 9/12  Remains on milrinone 0.125. Fixed dose NE 2 added yesterday for additional BP support to help w/ renal perfusion/ pre-op optimization.  Co-ox 62%. CVP 4.   Scr 1.6 -> 1.9 -> 1.7-> 1.6 ->1.5 today   Going for for [facility] today.   Sitting up in bed. Feels ok this morning. No resting dyspnea. No CP.   Objective:   Weight Range: 68.8 kg Body mass index is 22.4 kg/m.   Vital Signs:   Temp:  [97.6 F (36.4 C)-98.7 F (37.1 C)] 98 F (36.7 C) (09/10 0802) Pulse Rate:  [59-113] 63 (09/10 0700) Resp:  [8-30] 17 (09/10 0700) BP: (86-147)/(54-99) 98/68 (09/10 0700) SpO2:  [84 %-100 %] 98 % (09/10 0700) Weight:  [68.8 kg] 68.8 kg (09/10 0500) Last BM Date : 03/04/23  Weight change: Filed Weights   03/03/23 0432 03/04/23 0330 03/06/23 0500  Weight: 66.9 kg 67.5 kg 68.8 kg    Intake/Output:   Intake/Output Summary (Last 24 hours) at 03/06/2023 0816 Last data filed at 03/06/2023 0700 Gross per 24 hour  Intake 1321.78 ml  Output 1475 ml  Net -153.22 ml      Physical Exam    CVP 4  General:  fatigued appearing. No respiratory difficulty HEENT: normal Neck: supple. no JVD. Carotids 2+ bilat; no bruits. No lymphadenopathy or thyromegaly appreciated. Cor: PMI nondisplaced. Regular rate & rhythm. No rubs, gallops or murmurs. Lungs: clear Abdomen: soft, nontender, nondistended. No hepatosplenomegaly. No bruits or masses. Good bowel sounds. Extremities: no cyanosis, clubbing, rash, trace b/l LLE edema + RUE picc Neuro: alert & oriented x 3, cranial nerves grossly intact. moves all 4 extremities w/o difficulty. Affect pleasant.   Telemetry   NSR 60s  Personally reviewed  Labs    CBC Recent Labs    03/05/23 0451 03/06/23 0334  WBC 8.5 10.6*  HGB 13.4 14.3  HCT 39.1 42.8  MCV 93.1 92.4  PLT 168 204   Basic Metabolic Panel Recent Labs     03/05/23 0451 03/06/23 0334  NA 131* 128*  K 4.4 4.4  CL 101 99  CO2 22 21*  GLUCOSE 95 161*  BUN 37* 38*  CREATININE 1.61* 1.52*  CALCIUM 8.8* 8.7*  MG 2.2 2.2   Liver Function Tests No results for input(s): "AST", "ALT", "ALKPHOS", "BILITOT", "PROT", "ALBUMIN" in the last 72 hours.  No results for input(s): "LIPASE", "AMYLASE" in the last 72 hours. Cardiac Enzymes No results for input(s): "CKTOTAL", "CKMB", "CKMBINDEX", "TROPONINI" in the last 72 hours.  BNP: BNP (last 3 results) Recent Labs    10/15/22 1110 11/21/22 1122 02/26/23 0231  BNP 2,147.0* 914.3* 1,060.3*    ProBNP (last 3 results) Recent Labs    04/18/22 0958  PROBNP 587*     D-Dimer No results for input(s): "DDIMER" in the last 72 hours. Hemoglobin A1C No results for input(s): "HGBA1C" in the last 72 hours.  Fasting Lipid Panel No results for input(s): "CHOL", "HDL", "LDLCALC", "TRIG", "CHOLHDL", "LDLDIRECT" in the last 72 hours.  Thyroid Function Tests No results for input(s): "TSH", "T4TOTAL", "T3FREE", "THYROIDAB" in the last 72 hours.  Invalid input(s): "FREET3"   Other results:   Imaging    US RENAL  Result Date: 03/06/2023 CLINICAL DATA:  Preoperative evaluation. EXAM: RENAL / URINARY TRACT ULTRASOUND COMPLETE COMPARISON:  03/01/2023, 08/26/2014. FINDINGS: Right Kidney: Renal measurements: 9.6 x 5.2 x  4.2 cm = volume: 108.76 mL. Echogenicity within normal limits. No mass or hydronephrosis visualized. Left Kidney: Renal measurements: 10.2 x 5.1 x 3.8 cm = volume: 104.01 mL. Echogenicity within normal limits. No mass or hydronephrosis visualized. Bladder: No bladder wall thickening. Other: There is nodularity of the prostate gland with a 2.3 cm nodule protruding into the posterior aspect of the urinary bladder. Prostate gland was not measured on this exam. IMPRESSION: 1. Normal evaluation of the kidneys. 2. 2.3 cm prostate nodule protruding into the base of the urinary bladder.  Electronically Signed   By: Thornell Sartorius M.D.   On: 03/06/2023 01:25     Medications:     Scheduled Medications:  amiodarone  200 mg Oral Daily   atorvastatin  80 mg Oral Daily   Chlorhexidine Gluconate Cloth  6 each Topical Daily   feeding supplement  237 mL Oral TID BM   multivitamin with minerals  1 tablet Oral Daily   sodium chloride flush  10-40 mL Intracatheter Q12H   tamsulosin  0.4 mg Oral QPC supper    Infusions:  sodium chloride 10 mL/hr at 03/06/23 0700   heparin 1,200 Units/hr (03/06/23 0700)   milrinone 0.125 mcg/kg/min (03/06/23 0733)   norepinephrine (LEVOPHED) Adult infusion 2 mcg/min (03/06/23 0600)    PRN Medications: acetaminophen **OR** acetaminophen, ondansetron **OR** ondansetron (ZOFRAN) IV, mouth rinse, sodium chloride flush    Patient Profile   Steven [name] is a 67 y.o. 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   Admitted with acute on chronic systolic and diastolic CHF with low-output. Advanced Heart Failure consulted and workup started for advanced therapies.    Assessment/Plan    1. 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 CO 3.95. CI 2.15 - NYHA IV on admission - now on Milrinone 0.125 mcg/kg/min + NE 2 to help with renal perfusion. CO-OX 62% CVP ~4. Hold diuretics today  - GDMT limited by renal function and hypotension. - no ? blocker w/ low output  - Unable to get cMRI due to device mismatch (medtronic generator and a [employer] lead) - approved at [facility] for VAD. Plan R/LHC today.  Anticipate implant on Thursday.    CAD - h/o large anterior MI 2013 treated w/ DES to LAD  - No CP. HS troponin trend flat, not c/w ACS. - on statin  - no ASA given chronic coumadin - no s/s angina   AKI on CKD IIIa - Scr up to 2 this admission, baseline ~1.2 - Scr up 1.6-> 1.9-> 1.7->1.6->1.5 today - Cont Milrinone + NE to help renal function improve - Hold dapa/ARNI   Mural thrombus - On coumadin PTA, on heparin gtt - no bleeding   H/o VT s/p Medtronic ICD - likely scar mediated - Continue amiodarone 200 mg daily - s/p MDT ICD, last event 8/24 - Keep K>4, Mg >2 - stable   Length of Stay: 8  Harsh Trulock, PA-C  03/06/2023, 8:16 AM  Advanced Heart Failure Team Pager [phone] (M-F; 7a - 5p)  Please contact  [facility] for night-coverage after hours (5p -7a ) and weekends on [email or web address]

## 2023-03-06 NOTE — Interval H&P Note (Signed)
History and Physical Interval Note:  03/06/2023 9:19 AM  Steven [name]  has presented today for surgery, with the diagnosis of heart failure - cad.  The various methods of treatment have been discussed with the patient and family. After consideration of risks, benefits and other options for treatment, the patient has consented to  Procedure(s): RIGHT HEART CATH (N/A) as a surgical intervention.  The patient's history has been reviewed, patient examined, no change in status, stable for surgery.  I have reviewed the patient's chart and labs.  Questions were answered to the patient's satisfaction.     [name]

## 2023-03-06 NOTE — Progress Notes (Signed)
This chaplain is present for F/U spiritual care in the setting of completing HCPOA.  The Pt. is participating in personal care at the time of the visit. The chaplain will plan a revisit.  Chaplain Stephanie Acre [phone]

## 2023-03-06 NOTE — Progress Notes (Signed)
Day of Surgery Procedure(s) (LRB): RIGHT HEART CATH (N/A) Subjective: No complaints.  Had RHC today:  HEMODYNAMICS: RA:                  5 mmHg (mean) RV:                  38/5 mmHg PA:                  42/15 mmHg (24 mean) PCWP:            14 mmHg (mean)                                      Estimated Fick CO/CI   3.9 L/min, 2.1 L/min/m2 Thermodilution CO/CI   3.5 L/min, 1.9 L/min/m2                                              TPG                 10  mmHg                                            PVR                 6.2- 6.8 Wood Units  PAPi                >5       IMPRESSION: Normal pre and post capillary filling pressures.  Moderate to severely reduced cardiac output / index.  Elevated PVR  Objective: Vital signs in last 24 hours: Temp:  [97.6 F (36.4 C)-98.7 F (37.1 C)] 97.6 F (36.4 C) (09/10 1547) Pulse Rate:  [59-113] 78 (09/10 1500) Cardiac Rhythm: Normal sinus rhythm (09/10 0800) Resp:  [8-30] 20 (09/10 1500) BP: (86-147)/(54-99) 103/69 (09/10 0955) SpO2:  [84 %-100 %] 98 % (09/10 1500) Weight:  [68.8 kg] 68.8 kg (09/10 0500)  Hemodynamic parameters for last 24 hours: PAP: (43-62)/(14-27) 53/24 CVP:  [2 mmHg-13 mmHg] 11 mmHg  Intake/Output from previous day: 09/09 0701 - 09/10 0700 In: 1321.8 [P.O.:417; I.V.:904.8] Out: 1475 [Urine:1475] Intake/Output this shift: Total I/O In: 108.1 [I.V.:108.1] Out: 375 [Urine:375]  General appearance: alert and cooperative Neurologic: intact Heart: regular rate and rhythm, S1, S2 normal, no murmur Lungs: clear to auscultation bilaterally Extremities: no edema  Lab Results: Recent Labs    03/05/23 0451 03/06/23 0334 03/06/23 0936  WBC 8.5 10.6*  --   HGB 13.4 14.3 15.0  HCT 39.1 42.8 44.0  PLT 168 204  --    BMET:  Recent Labs    03/05/23 0451 03/06/23 0334 03/06/23 0936  NA 131* 128* 134*  K 4.4 4.4 4.6  CL 101 99  --   CO2 22 21*  --   GLUCOSE 95 161*  --   BUN 37* 38*  --   CREATININE  1.61* 1.52*  --   CALCIUM 8.8* 8.7*  --     PT/INR:  Recent Labs    03/06/23 0349  LABPROT 15.3*  INR 1.2   ABG    Component Value Date/Time   PHART 7.368 11/23/2010 2042  HCO3 21.7 03/06/2023 0936   TCO2 23 03/06/2023 0936   ACIDBASEDEF 3.0 (H) 03/06/2023 0936   O2SAT 65 03/06/2023 0936   CBG (last 3)  No results for input(s): "GLUCAP" in the last 72 hours.  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  Ejection fraction is less than 20% by echocardiogram and he was admitted with NYHA class IV symptoms.  He has stabilized on milrinone 0.125 and norepinephrine at 2 with right heart cath today showing low filling pressures. Co-ox is 62%.  The only option available for this patient is left ventricular assist device as destination therapy.  His operative risk is increased due to several factors.  His CT of the chest shows extensive calcification of his pericardium particularly over the right atrium and atrioventricular groove and part of the right ventricle.  His pericardial space is likely obliterated due to remote pericarditis.  With the degree of calcification it may take quite a while on pump to be able to free his heart.  There is also a risk of cardiac injury if this calcification extends down into the visceral pericardium which is the usual situation.  His ascending aorta is calcified over the posterior one half which may complicate partial occlusion of the aorta for sewing on the outflow graft.  It will certainly increase his risk of stroke.  He has stage III chronic kidney disease which may worsen with a prolonged pump run.  I discussed all this with him and he is in agreement to proceed.  I discussed the operative procedure with the patient including alternatives, benefits and risks; including but not limited to bleeding, blood transfusion,  infection, stroke, myocardial infarction, pump failure, organ dysfunction, and death.  Steven [name] understands and agrees to proceed.  We will schedule surgery for Thursday morning.  LOS: 8 days    Alleen Borne 03/06/2023

## 2023-03-06 NOTE — H&P (View-Only) (Signed)
Day of Surgery Procedure(s) (LRB): RIGHT HEART CATH (N/A) Subjective: No complaints.  Had RHC today:  HEMODYNAMICS: RA:                  5 mmHg (mean) RV:                  38/5 mmHg PA:                  42/15 mmHg (24 mean) PCWP:            14 mmHg (mean)                                      Estimated Fick CO/CI   3.9 L/min, 2.1 L/min/m2 Thermodilution CO/CI   3.5 L/min, 1.9 L/min/m2                                              TPG                 10  mmHg                                            PVR                 6.2- 6.8 Wood Units  PAPi                >5       IMPRESSION: Normal pre and post capillary filling pressures.  Moderate to severely reduced cardiac output / index.  Elevated PVR  Objective: Vital signs in last 24 hours: Temp:  [97.6 F (36.4 C)-98.7 F (37.1 C)] 97.6 F (36.4 C) (09/10 1547) Pulse Rate:  [59-113] 78 (09/10 1500) Cardiac Rhythm: Normal sinus rhythm (09/10 0800) Resp:  [8-30] 20 (09/10 1500) BP: (86-147)/(54-99) 103/69 (09/10 0955) SpO2:  [84 %-100 %] 98 % (09/10 1500) Weight:  [68.8 kg] 68.8 kg (09/10 0500)  Hemodynamic parameters for last 24 hours: PAP: (43-62)/(14-27) 53/24 CVP:  [2 mmHg-13 mmHg] 11 mmHg  Intake/Output from previous day: 09/09 0701 - 09/10 0700 In: 1321.8 [P.O.:417; I.V.:904.8] Out: 1475 [Urine:1475] Intake/Output this shift: Total I/O In: 108.1 [I.V.:108.1] Out: 375 [Urine:375]  General appearance: alert and cooperative Neurologic: intact Heart: regular rate and rhythm, S1, S2 normal, no murmur Lungs: clear to auscultation bilaterally Extremities: no edema  Lab Results: Recent Labs    03/05/23 0451 03/06/23 0334 03/06/23 0936  WBC 8.5 10.6*  --   HGB 13.4 14.3 15.0  HCT 39.1 42.8 44.0  PLT 168 204  --    BMET:  Recent Labs    03/05/23 0451 03/06/23 0334 03/06/23 0936  NA 131* 128* 134*  K 4.4 4.4 4.6  CL 101 99  --   CO2 22 21*  --   GLUCOSE 95 161*  --   BUN 37* 38*  --   CREATININE  1.61* 1.52*  --   CALCIUM 8.8* 8.7*  --     PT/INR:  Recent Labs    03/06/23 0349  LABPROT 15.3*  INR 1.2   ABG    Component Value Date/Time   PHART 7.368 11/23/2010 2042  HCO3 21.7 03/06/2023 0936   TCO2 23 03/06/2023 0936   ACIDBASEDEF 3.0 (H) 03/06/2023 0936   O2SAT 65 03/06/2023 0936   CBG (last 3)  No results for input(s): "GLUCAP" in the last 72 hours.  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  Ejection fraction is less than 20% by echocardiogram and he was admitted with NYHA class IV symptoms.  He has stabilized on milrinone 0.125 and norepinephrine at 2 with right heart cath today showing low filling pressures. Co-ox is 62%.  The only option available for this patient is left ventricular assist device as destination therapy.  His operative risk is increased due to several factors.  His CT of the chest shows extensive calcification of his pericardium particularly over the right atrium and atrioventricular groove and part of the right ventricle.  His pericardial space is likely obliterated due to remote pericarditis.  With the degree of calcification it may take quite a while on pump to be able to free his heart.  There is also a risk of cardiac injury if this calcification extends down into the visceral pericardium which is the usual situation.  His ascending aorta is calcified over the posterior one half which may complicate partial occlusion of the aorta for sewing on the outflow graft.  It will certainly increase his risk of stroke.  He has stage III chronic kidney disease which may worsen with a prolonged pump run.  I discussed all this with him and he is in agreement to proceed.  I discussed the operative procedure with the patient including alternatives, benefits and risks; including but not limited to bleeding, blood transfusion,  infection, stroke, myocardial infarction, pump failure, organ dysfunction, and death.  Steven [name] understands and agrees to proceed.  We will schedule surgery for Thursday morning.  LOS: 8 days    Alleen Borne 03/06/2023

## 2023-03-06 NOTE — Progress Notes (Signed)
Brief MCS Note:  VAD Coordinator met with pt at bedside. Provided space to discuss upcoming surgery. Pt states he does not have any questions at this time. He would like to speak with CSW again tomorrow regarding short term disability and education for [name] his ex-wife to assist. VAD Coordinator will reach out to CSW tomorrow to arrange for this.  Simmie Davies RN, BSN VAD Coordinator 24/7 Pager [phone]

## 2023-03-06 NOTE — Consult Note (Addendum)
ANTICOAGULATION CONSULT NOTE - Follow-up  Pharmacy Consult for heparin Indication: apical mural thrombus  No Known Allergies  Patient Measurements: Height: 5\' 9"  (175.3 cm) Weight: 68.8 kg (151 lb 10.8 oz) IBW/kg (Calculated) : 70.7 Heparin Dosing Weight: ~72 kg  Vital Signs: Temp: 98 F (36.7 C) (09/10 0802) Temp Source: Oral (09/10 0802) BP: 103/69 (09/10 0955) Pulse Rate: 62 (09/10 0955)  Labs: Recent Labs    03/04/23 0624 03/04/23 1400 03/05/23 0451 03/06/23 0334 03/06/23 0349  HGB 13.9  --  13.4 14.3  --   HCT 40.8  --  39.1 42.8  --   PLT 178  --  168 204  --   LABPROT  --   --   --   --  15.3*  INR  --   --   --   --  1.2  HEPARINUNFRC  --  0.51 0.45 0.52  --   CREATININE 1.70*  --  1.61* 1.52*  --     Estimated Creatinine Clearance: 45.3 mL/min (A) (by C-G formula based on SCr of 1.52 mg/dL (H)).  Medical History: Past Medical History:  Diagnosis Date   Acute MI, anterior wall (HCC)    AICD (automatic cardioverter/defibrillator) present    CAD (coronary artery disease)    2D ECHO, 07/13/2011 - EF <25%, LV moderatelty dilated, LA moderately dilatedLEXISCAN, 12/14/2011 - moderate-severe perfusion defect seen in the basal anteroseptal, mid anterior, apicacl anterior, apical, apical inferior, and apical lateral regions, post-stress EF 25%, new EKG changes from baseline abnormalities   Cancer [facility])    Prostate   CHF (congestive heart failure) (HCC) 2012   Hypertension 08/08/2021   Inguinal hernia, left    Pneumonia    November 2023   Pre-diabetes     Assessment: 67 yo male with history of HFrEF, CAD, VT, 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  Warfarin started on admission and held for procedures (last dose 9/2).  Pharmacy consulted to dose heparin.  Warfarin therapy held at this admission with anticipation of future procedures. Patient will continue on heparin infusion for now. Heparin level is  therapeutic at 0.52, on 1200 units/hr. Hgb 13.4, plt 168. No s/sx of bleeding or infusion issues.   This morning, UFH infusion was held 4 hours prior to RHC and resumed 2 hours following RHC. Infusion was continued at 1200 units/hour.  Goal of Therapy:  INR 2-3 Heparin level 0.3-0.7 units/ml Monitor platelets by anticoagulation protocol: Yes   Plan:  Continue heparin gtt at 1200 units/hr Daily heparin level, CBC, s/s bleeding  Thank you for allowing pharmacy to participate in this patient's care,  Wilmer Floor, PharmD PGY2 Cardiology Pharmacy Resident 03/06/2023 1:16 PM  Please check AMION for all [facility] Pharmacy phone numbers After 10:00 PM, call Main Pharmacy [phone]

## 2023-03-07 ENCOUNTER — Encounter (HOSPITAL_COMMUNITY): Payer: Self-pay | Admitting: Cardiology

## 2023-03-07 ENCOUNTER — Inpatient Hospital Stay (HOSPITAL_COMMUNITY): Payer: 59

## 2023-03-07 DIAGNOSIS — J9601 Acute respiratory failure with hypoxia: Secondary | ICD-10-CM | POA: Diagnosis not present

## 2023-03-07 DIAGNOSIS — I5023 Acute on chronic systolic (congestive) heart failure: Secondary | ICD-10-CM | POA: Diagnosis not present

## 2023-03-07 DIAGNOSIS — Z7189 Other specified counseling: Secondary | ICD-10-CM | POA: Diagnosis not present

## 2023-03-07 DIAGNOSIS — I5043 Acute on chronic combined systolic (congestive) and diastolic (congestive) heart failure: Secondary | ICD-10-CM | POA: Diagnosis not present

## 2023-03-07 DIAGNOSIS — I472 Ventricular tachycardia, unspecified: Secondary | ICD-10-CM | POA: Diagnosis not present

## 2023-03-07 LAB — COMPREHENSIVE METABOLIC PANEL
ALT: 30 U/L (ref 0–44)
AST: 35 U/L (ref 15–41)
Albumin: 3.4 g/dL — ABNORMAL LOW (ref 3.5–5.0)
Alkaline Phosphatase: 69 U/L (ref 38–126)
Anion gap: 6 (ref 5–15)
BUN: 30 mg/dL — ABNORMAL HIGH (ref 8–23)
CO2: 23 mmol/L (ref 22–32)
Calcium: 8.5 mg/dL — ABNORMAL LOW (ref 8.9–10.3)
Chloride: 104 mmol/L (ref 98–111)
Creatinine, Ser: 1.46 mg/dL — ABNORMAL HIGH (ref 0.61–1.24)
GFR, Estimated: 52 mL/min — ABNORMAL LOW (ref >60)
Glucose, Bld: 147 mg/dL — ABNORMAL HIGH (ref 70–99)
Potassium: 4.4 mmol/L (ref 3.5–5.1)
Sodium: 133 mmol/L — ABNORMAL LOW (ref 135–145)
Total Bilirubin: 0.9 mg/dL (ref 0.3–1.2)
Total Protein: 7.4 g/dL (ref 6.5–8.1)

## 2023-03-07 LAB — URINALYSIS, ROUTINE W REFLEX MICROSCOPIC
Bilirubin Urine: NEGATIVE
Glucose, UA: NEGATIVE mg/dL
Hgb urine dipstick: NEGATIVE
Ketones, ur: NEGATIVE mg/dL
Leukocytes,Ua: NEGATIVE
Nitrite: NEGATIVE
Protein, ur: NEGATIVE mg/dL
Specific Gravity, Urine: 1.013 (ref 1.005–1.030)
pH: 5 (ref 5.0–8.0)

## 2023-03-07 LAB — BASIC METABOLIC PANEL
Anion gap: 10 (ref 5–15)
BUN: 33 mg/dL — ABNORMAL HIGH (ref 8–23)
CO2: 21 mmol/L — ABNORMAL LOW (ref 22–32)
Calcium: 8.9 mg/dL (ref 8.9–10.3)
Chloride: 102 mmol/L (ref 98–111)
Creatinine, Ser: 1.45 mg/dL — ABNORMAL HIGH (ref 0.61–1.24)
GFR, Estimated: 52 mL/min — ABNORMAL LOW (ref >60)
Glucose, Bld: 121 mg/dL — ABNORMAL HIGH (ref 70–99)
Potassium: 4.6 mmol/L (ref 3.5–5.1)
Sodium: 133 mmol/L — ABNORMAL LOW (ref 135–145)

## 2023-03-07 LAB — CBC
HCT: 38.6 % — ABNORMAL LOW (ref 39.0–52.0)
Hemoglobin: 12.8 g/dL — ABNORMAL LOW (ref 13.0–17.0)
MCH: 31 pg (ref 26.0–34.0)
MCHC: 33.2 g/dL (ref 30.0–36.0)
MCV: 93.5 fL (ref 80.0–100.0)
Platelets: 174 10*3/uL (ref 150–400)
RBC: 4.13 MIL/uL — ABNORMAL LOW (ref 4.22–5.81)
RDW: 13.3 % (ref 11.5–15.5)
WBC: 10.4 10*3/uL (ref 4.0–10.5)
nRBC: 0 % (ref 0.0–0.2)

## 2023-03-07 LAB — COOXEMETRY PANEL
Carboxyhemoglobin: 2 % — ABNORMAL HIGH (ref 0.5–1.5)
Methemoglobin: 0.7 % (ref 0.0–1.5)
O2 Saturation: 64.7 %
Total hemoglobin: 13.4 g/dL (ref 12.0–16.0)

## 2023-03-07 LAB — SURGICAL PCR SCREEN
MRSA, PCR: NEGATIVE
Staphylococcus aureus: NEGATIVE

## 2023-03-07 LAB — HEPARIN LEVEL (UNFRACTIONATED): Heparin Unfractionated: 0.39 [IU]/mL (ref 0.30–0.70)

## 2023-03-07 LAB — MAGNESIUM: Magnesium: 2.1 mg/dL (ref 1.7–2.4)

## 2023-03-07 MED ORDER — HEPARIN 30,000 UNITS/1000 ML (OHS) CELLSAVER SOLUTION
Status: DC
  Filled 2023-03-07 (×2): qty 1000

## 2023-03-07 MED ORDER — DIAZEPAM 5 MG PO TABS
5.0000 mg | ORAL_TABLET | Freq: Once | ORAL | Status: AC
  Administered 2023-03-08: 5 mg via ORAL
  Filled 2023-03-07: qty 1

## 2023-03-07 MED ORDER — DEXMEDETOMIDINE HCL IN NACL 400 MCG/100ML IV SOLN
0.1000 ug/kg/h | INTRAVENOUS | Status: AC
  Administered 2023-03-08: .7 ug/kg/h via INTRAVENOUS
  Filled 2023-03-07 (×2): qty 100

## 2023-03-07 MED ORDER — CHLORHEXIDINE GLUCONATE CLOTH 2 % EX PADS
6.0000 | MEDICATED_PAD | Freq: Once | CUTANEOUS | Status: AC
  Administered 2023-03-07: 6 via TOPICAL

## 2023-03-07 MED ORDER — CHLORHEXIDINE GLUCONATE CLOTH 2 % EX PADS
6.0000 | MEDICATED_PAD | Freq: Once | CUTANEOUS | Status: AC
  Administered 2023-03-08: 6 via TOPICAL

## 2023-03-07 MED ORDER — BISACODYL 5 MG PO TBEC: 5.0000 mg | DELAYED_RELEASE_TABLET | Freq: Once | ORAL | Status: DC

## 2023-03-07 MED ORDER — CHLORHEXIDINE GLUCONATE 0.12 % MT SOLN
15.0000 mL | Freq: Once | OROMUCOSAL | Status: AC
  Administered 2023-03-08: 15 mL via OROMUCOSAL
  Filled 2023-03-07: qty 15

## 2023-03-07 MED ORDER — VANCOMYCIN HCL 1 G IV SOLR
1000.0000 mg | INTRAVENOUS | Status: DC
  Filled 2023-03-07: qty 20

## 2023-03-07 MED ORDER — MILRINONE LACTATE IN DEXTROSE 20-5 MG/100ML-% IV SOLN
0.3000 ug/kg/min | INTRAVENOUS | Status: AC
  Administered 2023-03-08: .125 ug/kg/min via INTRAVENOUS
  Filled 2023-03-07 (×2): qty 100

## 2023-03-07 MED ORDER — SODIUM CHLORIDE 0.9 % IV SOLN
600.0000 mg | INTRAVENOUS | Status: AC
  Administered 2023-03-08: 600 mg via INTRAVENOUS
  Filled 2023-03-07: qty 10

## 2023-03-07 MED ORDER — VASOPRESSIN 20 UNITS/100 ML INFUSION FOR SHOCK
0.0400 [IU]/min | INTRAVENOUS | Status: AC
  Administered 2023-03-08: .03 [IU]/min via INTRAVENOUS
  Filled 2023-03-07 (×2): qty 100

## 2023-03-07 MED ORDER — EPINEPHRINE HCL 5 MG/250ML IV SOLN IN NS
0.0000 ug/min | INTRAVENOUS | Status: AC
  Administered 2023-03-08: 2 ug/min via INTRAVENOUS
  Filled 2023-03-07 (×2): qty 250

## 2023-03-07 MED ORDER — TEMAZEPAM 15 MG PO CAPS: 15.0000 mg | ORAL_CAPSULE | Freq: Once | ORAL | Status: DC | PRN

## 2023-03-07 MED ORDER — PHENYLEPHRINE HCL-NACL 20-0.9 MG/250ML-% IV SOLN
0.0000 ug/min | INTRAVENOUS | Status: DC
  Filled 2023-03-07: qty 250

## 2023-03-07 MED ORDER — NOREPINEPHRINE 4 MG/250ML-% IV SOLN
0.0000 ug/min | INTRAVENOUS | Status: AC
  Administered 2023-03-08: 2 ug/min via INTRAVENOUS
  Filled 2023-03-07 (×2): qty 250

## 2023-03-07 MED ORDER — MUPIROCIN 2 % EX OINT: 1.0000 | TOPICAL_OINTMENT | Freq: Two times a day (BID) | CUTANEOUS | Status: DC

## 2023-03-07 MED ORDER — DOBUTAMINE-DEXTROSE 4-5 MG/ML-% IV SOLN
2.0000 ug/kg/min | INTRAVENOUS | Status: DC
  Filled 2023-03-07: qty 250

## 2023-03-07 MED ORDER — TRANEXAMIC ACID 1000 MG/10ML IV SOLN
1.5000 mg/kg/h | INTRAVENOUS | Status: AC
  Administered 2023-03-08: 1.5 mg/kg/h via INTRAVENOUS
  Filled 2023-03-07 (×2): qty 25

## 2023-03-07 MED ORDER — CEFAZOLIN SODIUM-DEXTROSE 2-4 GM/100ML-% IV SOLN
2.0000 g | INTRAVENOUS | Status: AC
  Administered 2023-03-08: 2 g via INTRAVENOUS
  Filled 2023-03-07 (×2): qty 100

## 2023-03-07 MED ORDER — MUPIROCIN 2 % EX OINT
1.0000 | TOPICAL_OINTMENT | Freq: Two times a day (BID) | CUTANEOUS | Status: AC
  Administered 2023-03-07 – 2023-03-08 (×2): 1 via NASAL
  Filled 2023-03-07: qty 22

## 2023-03-07 MED ORDER — TRANEXAMIC ACID (OHS) PUMP PRIME SOLUTION
2.0000 mg/kg | INTRAVENOUS | Status: DC
  Filled 2023-03-07 (×2): qty 1.41

## 2023-03-07 MED ORDER — TRANEXAMIC ACID (OHS) BOLUS VIA INFUSION
15.0000 mg/kg | INTRAVENOUS | Status: AC
  Administered 2023-03-08: 1054.5 mg via INTRAVENOUS
  Filled 2023-03-07 (×2): qty 1055

## 2023-03-07 MED ORDER — CEFAZOLIN SODIUM-DEXTROSE 2-4 GM/100ML-% IV SOLN
2.0000 g | INTRAVENOUS | Status: AC
  Administered 2023-03-08: 2 g via INTRAVENOUS
  Filled 2023-03-07: qty 100

## 2023-03-07 MED ORDER — MAGNESIUM SULFATE 50 % IJ SOLN
40.0000 meq | INTRAMUSCULAR | Status: DC
  Filled 2023-03-07 (×3): qty 9.85

## 2023-03-07 MED ORDER — DOPAMINE-DEXTROSE 3.2-5 MG/ML-% IV SOLN
0.0000 ug/kg/min | INTRAVENOUS | Status: DC
  Filled 2023-03-07 (×2): qty 250

## 2023-03-07 MED ORDER — VANCOMYCIN HCL 1250 MG/250ML IV SOLN
1250.0000 mg | INTRAVENOUS | Status: AC
  Administered 2023-03-08: 1250 mg via INTRAVENOUS
  Filled 2023-03-07 (×2): qty 250

## 2023-03-07 MED ORDER — NITROGLYCERIN IN D5W 200-5 MCG/ML-% IV SOLN
0.0000 ug/min | INTRAVENOUS | Status: DC
  Filled 2023-03-07 (×2): qty 250

## 2023-03-07 MED ORDER — INSULIN REGULAR(HUMAN) IN NACL 100-0.9 UT/100ML-% IV SOLN
INTRAVENOUS | Status: AC
  Administered 2023-03-08: 1.5 [IU]/h via INTRAVENOUS
  Filled 2023-03-07 (×2): qty 100

## 2023-03-07 MED ORDER — POTASSIUM CHLORIDE 2 MEQ/ML IV SOLN
80.0000 meq | INTRAVENOUS | Status: DC
  Filled 2023-03-07: qty 40

## 2023-03-07 MED ORDER — FLUCONAZOLE IN SODIUM CHLORIDE 400-0.9 MG/200ML-% IV SOLN
400.0000 mg | INTRAVENOUS | Status: AC
  Administered 2023-03-08: 400 mg via INTRAVENOUS
  Filled 2023-03-07 (×2): qty 200

## 2023-03-07 MED ORDER — FUROSEMIDE 10 MG/ML IJ SOLN
40.0000 mg | Freq: Once | INTRAMUSCULAR | Status: AC
  Administered 2023-03-07: 40 mg via INTRAVENOUS
  Filled 2023-03-07: qty 4

## 2023-03-07 NOTE — Progress Notes (Signed)
Daily Progress Note   Patient Name: Steven [name]       Date: 03/07/2023 DOB: 1955/11/19  Age: 67 y.o. MRN#: [id number] Attending Physician: Dorthula Nettles, DO Primary Care Physician: [name], MD Admit Date: 02/26/2023  Reason for Consultation/Follow-up: Establishing goals of care  Subjective: Medical records reviewed including progress notes, labs, imaging. Patient assessed at the bedside.  He is sitting up in bedside chair, denies any pain or distress.  No family present during the visit.  Created space and opportunity for patient's thoughts and feelings on his current illness.  We discussed the anxiety/dyspnea he felt a couple days ago after a difficult phone call, thankfully no episodes since.  He is still working on his short-term disability benefits.  Does not feel overly stressed at this time.  No further questions or concerns about his VAD implantation tomorrow.  Reviewed advance care planning and the importance of naming a healthcare surrogate that will honor his wishes above their own thoughts/preferences.  He is concerned about whether his primary choice, a friend who lives in [place], can handle this responsibility with his own medical issues.  We discussed consideration of who could be reasonably reached by phone as well.  Patient is appreciative of the support.  Questions and concerns addressed. PMT will continue to support holistically.   Length of Stay: 9   Physical Exam Vitals and nursing note reviewed.  Constitutional:      General: He is not in acute distress. Cardiovascular:     Rate and Rhythm: Normal rate.  Pulmonary:     Effort: Pulmonary effort is normal.  Neurological:     Mental Status: He is alert and oriented to person, place, and time.   Psychiatric:        Mood and Affect: Mood normal.        Behavior: Behavior normal.            Vital Signs: BP 105/61   Pulse 68   Temp 98.4 F (36.9 C)   Resp (!) 21   Ht 5\' 9"  (1.753 m)   Wt 70.3 kg   SpO2 94%   BMI 22.89 kg/m  SpO2: SpO2: 94 % O2 Device: O2 Device: Room Air O2 Flow Rate: O2 Flow Rate (L/min): 4 L/min      Palliative Care Assessment &  Plan   Patient Profile: 67 y.o. male  with past medical history of ischemic cardiomyopathy, systolic heart failure with a EF of less than 20%, LV thrombus, ventricular tachycardia s/p ICD, hypertension, CKD stage 3b, hyperlipidemia and hypertension  admitted on 02/26/2023 with dyspnea.    Patient was recently admitted for heart failure exacerbation from 4/21 to 4/24.  He is now admitted again with another acute heart failure exacerbation, acute hypoxic/hypercapnic respiratory failure secondary to cardiogenic pulmonary edema, AKI on CKD 3.  Advanced heart failure team is beginning workup for possible LVAD. PMT has been consulted to assist with LVAD evaluation, goals of care conversation.  Assessment: Goals of care conversation Acute hypoxic respiratory failure Acute on chronic systolic CHF with EF less than 20% Pulmonary edema AKI on CKD 3B  Recommendations/Plan: Continue full code/full scope treatment Continue advance care planning discussions Psychosocial and emotional support provided PMT will continue to follow and support intermittently   Prognosis:  Unable to determine  Discharge Planning: To Be Determined  Care plan was discussed with patient, chaplain Stephanie Acre   Total time: I spent 35 minutes in the care of the patient today in the above activities and documenting the encounter.  MDM: Moderate   Gwenivere Hiraldo Loleta Rose Palliative Medicine Team Team phone # [phone]  Thank you for allowing the Palliative Medicine Team to assist in the care of this patient. Please utilize secure chat with  additional questions, if there is no response within 30 minutes please call the above phone number.  Palliative Medicine Team providers are available by phone from 7am to 7pm daily and can be reached through the team cell phone.  Should this patient require assistance outside of these hours, please call the patient's attending physician.  Portions of this note are a verbal dictation therefore any spelling and/or grammatical errors are due to the "[employer]" system interpretation.

## 2023-03-07 NOTE — Progress Notes (Signed)
Advanced Heart Failure Rounding Note  PCP-Cardiologist: Nicki Guadalajara, MD   Subjective:    Approved at [facility]. Planning VAD 9/12  RHC 9/10 on milrinone + NE (RA 5, PAP 42/15, PCW 14, TD CI 1.9, FICK CI 2.1, PAPi > 5)  Remains on milrinone 0.125. Fixed dose NE 2 added yesterday for additional BP support to help w/ renal perfusion/ pre-op optimization.  Hemodynamics improved, CI 2.6. Co-ox 65%. CVP 11.  Scr much improved, down to 1.4 today.   Sitting up in bed. No current complaints. Denies resting dyspnea. No CP    Objective:   Weight Range: 70.3 kg Body mass index is 22.89 kg/m.   Vital Signs:   Temp:  [97.6 F (36.4 C)-99.5 F (37.5 C)] 98.1 F (36.7 C) (09/11 0930) Pulse Rate:  [59-87] 69 (09/11 0930) Resp:  [8-25] 19 (09/11 0930) BP: (100-129)/(57-84) 108/68 (09/11 0930) SpO2:  [93 %-100 %] 100 % (09/11 0930) Weight:  [70.3 kg] 70.3 kg (09/11 0500) Last BM Date : 03/06/23  Weight change: Filed Weights   03/04/23 0330 03/06/23 0500 03/07/23 0500  Weight: 67.5 kg 68.8 kg 70.3 kg    Intake/Output:   Intake/Output Summary (Last 24 hours) at 03/07/2023 0959 Last data filed at 03/07/2023 0900 Gross per 24 hour  Intake 946.6 ml  Output 1950 ml  Net -1003.4 ml      Physical Exam    CVP 10-11 General:  Well appearing, thin. No respiratory difficulty HEENT: normal Neck: supple. + Rt internal jugular Swan, JVD 10 cm. Carotids 2+ bilat; no bruits. No lymphadenopathy or thyromegaly appreciated. Cor: PMI nondisplaced. Regular rate & rhythm. No rubs, gallops or murmurs. Lungs: clear Abdomen: soft, nontender, nondistended. No hepatosplenomegaly. No bruits or masses. Good bowel sounds. Extremities: no cyanosis, clubbing, rash, edema Neuro: alert & oriented x 3, cranial nerves grossly intact. moves all 4 extremities w/o difficulty. Affect pleasant.  Telemetry   NSR 60s  Personally reviewed  Labs    CBC Recent Labs    03/06/23 0334 03/06/23 0936  03/07/23 0317  WBC 10.6*  --  10.4  HGB 14.3 14.6  15.0 12.8*  HCT 42.8 43.0  44.0 38.6*  MCV 92.4  --  93.5  PLT 204  --  174   Basic Metabolic Panel Recent Labs    31/51/76 0334 03/06/23 0936 03/07/23 0317  NA 128* 134*  134* 133*  K 4.4 4.5  4.6 4.6  CL 99  --  102  CO2 21*  --  21*  GLUCOSE 161*  --  121*  BUN 38*  --  33*  CREATININE 1.52*  --  1.45*  CALCIUM 8.7*  --  8.9  MG 2.2  --  2.1   Liver Function Tests No results for input(s): "AST", "ALT", "ALKPHOS", "BILITOT", "PROT", "ALBUMIN" in the last 72 hours.  No results for input(s): "LIPASE", "AMYLASE" in the last 72 hours. Cardiac Enzymes No results for input(s): "CKTOTAL", "CKMB", "CKMBINDEX", "TROPONINI" in the last 72 hours.  BNP: BNP (last 3 results) Recent Labs    10/15/22 1110 11/21/22 1122 02/26/23 0231  BNP 2,147.0* 914.3* 1,060.3*    ProBNP (last 3 results) Recent Labs    04/18/22 0958  PROBNP 587*     D-Dimer No results for input(s): "DDIMER" in the last 72 hours. Hemoglobin A1C No results for input(s): "HGBA1C" in the last 72 hours.  Fasting Lipid Panel No results for input(s): "CHOL", "HDL", "LDLCALC", "TRIG", "CHOLHDL", "LDLDIRECT" in the last 72 hours.  Thyroid Function Tests No results for input(s): "TSH", "T4TOTAL", "T3FREE", "THYROIDAB" in the last 72 hours.  Invalid input(s): "FREET3"   Other results:   Imaging    No results found.   Medications:     Scheduled Medications:  amiodarone  200 mg Oral Daily   atorvastatin  80 mg Oral Daily   Chlorhexidine Gluconate Cloth  6 each Topical Daily   feeding supplement  237 mL Oral TID BM   furosemide  40 mg Intravenous Once   multivitamin with minerals  1 tablet Oral Daily   sodium chloride flush  10-40 mL Intracatheter Q12H   tamsulosin  0.4 mg Oral QPC supper    Infusions:  heparin 1,200 Units/hr (03/07/23 0900)   milrinone 0.125 mcg/kg/min (03/07/23 0900)   norepinephrine (LEVOPHED) Adult infusion 2  mcg/min (03/07/23 0900)    PRN Medications: acetaminophen **OR** acetaminophen, ondansetron **OR** ondansetron (ZOFRAN) IV, mouth rinse, sodium chloride flush    Patient Profile   Steven [name] is a 67 y.o. 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   Admitted with acute on chronic systolic and diastolic CHF with low-output. Advanced Heart Failure consulted and workup started for advanced therapies.    Assessment/Plan    1. 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 CO 3.95. CI 2.15 - end-stage w/ low output and inotrope dependent needing durable VAD. GDMT limited by renal function and hypotension. - approved at [facility] for VAD. Anticipate implant on Thursday.  - now on Milrinone 0.125 mcg/kg/min + NE 2 to help with renal perfusion/ pre-op optimization. CO-OX 65%, CI 2.6. CVP 11 - cont current support - diurese w/ IV Lasix 40 mg x 1 and follow response   CAD - h/o large anterior MI 2013 treated w/ DES to LAD  - No CP. HS troponin trend flat, not c/w ACS. - on statin  - no ASA given chronic coumadin - no s/s angina   AKI on CKD IIIa - Scr up to 2 this admission, baseline ~1.2 - cardiorenal  - Scr up 1.6-> 1.9-> 1.7->1.6->1.5->1.4 today - Cont Milrinone + NE to help renal function improve - Hold dapa/ARNI   Mural thrombus - On coumadin PTA, on heparin gtt - no bleeding   H/o VT s/p Medtronic ICD - likely scar mediated - Continue amiodarone 200 mg daily - s/p MDT ICD, last event 8/24 - Keep K>4, Mg >2 -  stable   Length of Stay: [address] PA-C  03/07/2023, 9:59 AM  Advanced Heart Failure Team Pager [phone] (M-F; 7a - 5p)  Please contact [facility] for night-coverage after hours (5p -7a ) and weekends on [email or web address]

## 2023-03-07 NOTE — Progress Notes (Signed)
ABG attempted unsuccessfully 2X. I will ask another RT to attempt to obtain the specimen.

## 2023-03-07 NOTE — Progress Notes (Signed)
Steven [name] has been discussed with the VAD Medical Review board on 03/05/2023. The team feels as if the patient is a good candidate for Destination LVAD therapy. The patient meets criteria for a LVAD implant as listed below:  1) NYHA Class: IV documented on 03/06/2023  2) Has a left ventricular ejection fraction (LVEF) < 25%   *EF < 20% by echo 02/27/2023  3) Must meet one of the following:   Is inotrope dependent   *On inotropes Milrinone 0.125 mcg/kg/min started 02/28/2023  OR  Has a Cardiac Index (CI) < 2.2  L/min/m2 while not on inotropes:   *CI: 1.9 02/28/2023  4) Must meet one of the following:   ___X___ Is on optimal medical management (OMM), based on current heart failure practice guidelines for at least 45 of the last 60 days and are failing to respond   ______ Has advanced heart failure for at least 14 days and are dependent on an intra-aortic balloon pump (IABP) or similar temporary mechanical circulatory support for at least 7 days      ____ IABP inserted (date) ____      ____ Impella inserted (date) ____  5)  Social work and palliative care evaluations demonstrate appropriate support system in place for discharge to home with a VAD and that end of life discussions have taken place. Both services have expressed no concern regarding patient's candidacy.         *Social work consult (date): 03/02/2023 Rosetta Posner        *Palliative Care Consult (date): 03/02/2023 Josseline Cooper  6)  Primary caretaker identified that can be taught along with the patient how to manage        the VAD equipment.        *Name: Jon Gills  7)  Deemed appropriate by our financial coordinator: Forestine Na        Prior approval: 03/07/2023 [id number]  8)  VAD Coordinator, Simmie Davies has met with patient and caregiver, shown them the VAD equipment and discussed with the patient and caregiver about lifestyle changes necessary for success on mechanical circulatory device.        *Met with  Jon Gills on 03/06/2026.       *Consent for VAD Evaluation/Caregiver Agreement/HIPPA Release/Photo Release signed on 03/01/2023   9)  Six Minute Walk:  930 ft 03/03/2023   10) KCCQ Pre VAD:  [facility] Cardiomyopathy Questionnaire  KCCQ-12    1 a. Ability to shower/bathe Slightly limited    1 b. Ability to walk 1 block Moderately limited    1 c. Ability to hurry/jog Quite a bit    2. Edema feet/ankles/legs 1-2 times per week   3. Limited by fatigue At least once a day   4. Limited by dyspnea Less than once a week   5. Sitting up / on 3+ pillows 1-2 times per week   6. Limited enjoyment of life It has slightly limited my enjoyment of life   7. Rest of life w/ symptoms Mostly dissatisfied   8 a. Participation in hobbies Moderately limited   8 b. Participation in chores Slightly limited   8 c. Visiting family/friends Slightly limited      11)  Intermacs profile: 3  INTERMACS 1: Critical cardiogenic shock describes a patient who is "crashing and burning", in which a patient has life-threatening hypotension and rapidly escalating inotropic pressor support, with critical organ hypoperfusion often confirmed by worsening acidosis and lactate levels.  INTERMACS 2: Progressive decline describes  a patient who has been demonstrated "dependent" on inotropic support but nonetheless shows signs of continuing deterioration in nutrition, renal function, fluid retention, or other major status indicator. Patient profile 2 can also describe a patient with refractory volume overload, perhaps with evidence of impaired perfusion, in whom inotropic infusions cannot be maintained due to tachyarrhythmias, clinical ischemia, or other intolerance.  INTERMACS 3: Stable but inotrope dependent describes a patient who is clinically stable on mild-moderate doses of intravenous inotropes (or has a temporary circulatory support device) after repeated documentation of failure to wean without symptomatic  hypotension, worsening symptoms, or progressive organ dysfunction (usually renal). It is critical to monitor nutrition, renal function, fluid balance, and overall status carefully in order to distinguish between a   patient who is truly stable at Patient Profile 3 and a patient who has unappreciated decline rendering them Patient Profile 2. This patient may be either at home or in the hospital.      INTERMACS 4: Resting symptoms describes a patient who is at home on oral therapy but frequently has symptoms of congestion at rest or with activities of daily living (ADL). He or she may have orthopnea, shortness of breath during ADL such as dressing or bathing, gastrointestinal symptoms (abdominal discomfort, nausea, poor appetite), disabling ascites or severe lower extremity edema. This patient should be carefully considered for more intensive management and surveillance programs, which may in some cases, reveal poor compliance that would compromise outcomes with any therapy.   .   INTERMACS 5: Exertion Intolerant describes a patient who is comfortable at rest but unable to engage in any activity, living predominantly within the house or housebound. This patient has no congestive symptoms, but may have chronically elevated volume status, frequently with renal dysfunction, and may be characterized as exercise intolerant.      INTERMACS 6: Exertion Limited also describes a patient who is comfortable at rest without evidence of fluid overload, but who is able to do some mild activity. Activities of daily living are comfortable and minor activities outside the home such as visiting friends or going to a restaurant can be performed, but fatigue results within a few minutes of any meaningful physical exertion. This patient has occasional episodes of worsening symptoms and is likely to have had a hospitalization for heart failure within the past year.   INTERMACS 7: Advanced NYHA Class 3 describes a patient who  is clinically stable with a reasonable level of comfortable activity, despite history of previous decompensation that is not recent. This patient is usually able to walk more than a block. Any decompensation requiring intravenous diuretics or hospitalization within the previous month should make this person a Patient Profile 6 or lower.

## 2023-03-07 NOTE — Progress Notes (Addendum)
CT surgery preop note for VAD implant  Results of pre-op  VAD vascular Doppler studies reviewed.   The studies document mild hemodynamically insignificant disease of bilateral carotid flow, normal peripheral ABI of the right lower extremity  and normal ABI of the left lower extremity.  The  moderate decrease in the left toe-brachial index does not pose a significant risk for the patient undergoing VAD implantation and we will plan on going forward with the VAD implantation as the best therapy for his severe heart failure and cardiomyopathy.  Lovett Sox MD, cardiothoracic surgery

## 2023-03-07 NOTE — Anesthesia Preprocedure Evaluation (Addendum)
Anesthesia Evaluation  Patient identified by MRN, date of birth, ID band Patient awake    Reviewed: Allergy & Precautions, H&P , NPO status , Patient's Chart, lab work & pertinent test results  Airway Mallampati: II  TM Distance: >3 FB Neck ROM: Full    Dental no notable dental hx.    Pulmonary former smoker   Pulmonary exam normal breath sounds clear to auscultation       Cardiovascular hypertension, + CAD, + Past MI and +CHF  Normal cardiovascular exam+ dysrhythmias Atrial Fibrillation + Cardiac Defibrillator  Rhythm:Regular Rate:Normal  Hx of LV thrombus  RHC 02/2023 PAPI >5  LVEF <20%. Resolution of LV thrombus on most recent echo RV mild MR mild   Neuro/Psych negative neurological ROS  negative psych ROS   GI/Hepatic negative GI ROS,,,Hx of alcohol abuse   Endo/Other  negative endocrine ROS    Renal/GU CRFRenal disease   Prostate cancer    Musculoskeletal negative musculoskeletal ROS (+)    Abdominal   Peds negative pediatric ROS (+)  Hematology negative hematology ROS (+)   Anesthesia Other Findings   Reproductive/Obstetrics negative OB ROS                             Anesthesia Physical Anesthesia Plan  ASA: 4  Anesthesia Plan: General   Post-op Pain Management:    Induction: Intravenous  PONV Risk Score and Plan: Treatment may vary due to age or medical condition and Ondansetron  Airway Management Planned: Oral ETT  Additional Equipment: Arterial line, CVP, PA Cath, TEE, 3D TEE and Ultrasound Guidance Line Placement  Intra-op Plan:   Post-operative Plan: Post-operative intubation/ventilation  Informed Consent: I have reviewed the patients History and Physical, chart, labs and discussed the procedure including the risks, benefits and alternatives for the proposed anesthesia with the patient or authorized representative who has indicated his/her understanding  and acceptance.     Dental advisory given  Plan Discussed with: CRNA  Anesthesia Plan Comments: (Steven [name] is a 67 y.o. male with past medical history significant for chronic systolic congestive heart failure (LVEF <20%), ischemic cardiomyopathy, history of LV thrombus, history of ventricular tachycardia s/p ICD, CKD stage IIIb, hyperlipidemia, hypertension.  RHC HEMODYNAMICS: RA:                  5 mmHg (mean) RV:                  38/5 mmHg PA:                  42/15 mmHg (24 mean) PCWP:            14 mmHg (mean)   )        Anesthesia Quick Evaluation

## 2023-03-07 NOTE — TOC Progression Note (Signed)
Transition of Care [facility]) - Progression Note    Patient Details  Name: Steven [name] MRN: [id number] Date of Birth: Dec 08, 1955  [facility] CM/SW Contact  Elliot Cousin, RN Phone Number: [phone] 03/07/2023, 4:21 PM  Clinical Narrative:   HF TOC CM spoke to pt at bedside. States scheduled for his surgery on 03/08/2023. Will continue to follow for dc needs as pt progresses.     Expected Discharge Plan: Home/Self Care Barriers to Discharge: Continued Medical Work up  Expected Discharge Plan and Services   Discharge Planning Services: CM Consult   Living arrangements for the past 2 months: Single Family Home                                       Social Determinants of Health (SDOH) Interventions SDOH Screenings   Food Insecurity: No Food Insecurity (02/26/2023)  Housing: High Risk (02/26/2023)  Transportation Needs: No Transportation Needs (02/26/2023)  Utilities: Not At Risk (02/26/2023)  Alcohol Screen: Low Risk  (05/08/2022)  Financial Resource Strain: Low Risk  (05/08/2022)  Social Connections: Unknown (11/08/2021)   Received from [facility]  Tobacco Use: Medium Risk (02/26/2023)    Readmission Risk Interventions     No data to display

## 2023-03-07 NOTE — Progress Notes (Addendum)
This chaplain is present for F/U spiritual care in the setting of creating the Pt. Advance Directive.  The Pt. is awake and willing to talk about the importance of naming a HCPOA with the chaplain. Family is not at the bedside.  The chaplain understands through reflective listening the Pt. is questioning his previous choice of daughter-Joria serving as the surrogate decision maker. The chaplain provided education on the importance of completing HCPOA pre-procedure if he changes his mind. The Pt. requested a pause to process his HCPOA possibilities.   The Pt.  articulated the need for a champion to receive pre/post procedure education with the Pt., which may or may not be the same person.  The chaplain plans to revisit the Pt. later in the day.    **[id number] This Pt. has decided to name Lillia Pauls as his healthcare agent. If this person is unwilling or unable to serve in this role the Pt. next choice Inemi Oguara. The Pt. completed a Living Will.  The chaplain gave the Pt. the original AD document along with two copies. The chaplain scanned the Pt. Advance Directive:  HCPOA and Living Will into the Pt. EMR.  This chaplain is available for F/U spiritual care as needed.    Chaplain Stephanie Acre [phone]

## 2023-03-07 NOTE — Consult Note (Signed)
ANTICOAGULATION CONSULT NOTE - Follow-up  Pharmacy Consult for heparin Indication: apical mural thrombus  No Known Allergies  Patient Measurements: Height: 5\' 9"  (175.3 cm) Weight: 70.3 kg (154 lb 15.7 oz) IBW/kg (Calculated) : 70.7 Heparin Dosing Weight: ~72 kg  Vital Signs: Temp: 98.8 F (37.1 C) (09/11 1445) Temp Source: Core (09/11 1200) BP: 106/63 (09/11 1445) Pulse Rate: 73 (09/11 1445)  Labs: Recent Labs    03/05/23 0451 03/06/23 0334 03/06/23 0349 03/06/23 0936 03/07/23 0317  HGB 13.4 14.3  --  14.6  15.0 12.8*  HCT 39.1 42.8  --  43.0  44.0 38.6*  PLT 168 204  --   --  174  LABPROT  --   --  15.3*  --   --   INR  --   --  1.2  --   --   HEPARINUNFRC 0.45 0.52  --   --  0.39  CREATININE 1.61* 1.52*  --   --  1.45*    Estimated Creatinine Clearance: 48.5 mL/min (A) (by C-G formula based on SCr of 1.45 mg/dL (H)).  Medical History: Past Medical History:  Diagnosis Date   Acute MI, anterior wall (HCC)    AICD (automatic cardioverter/defibrillator) present    CAD (coronary artery disease)    2D ECHO, 07/13/2011 - EF <25%, LV moderatelty dilated, LA moderately dilatedLEXISCAN, 12/14/2011 - moderate-severe perfusion defect seen in the basal anteroseptal, mid anterior, apicacl anterior, apical, apical inferior, and apical lateral regions, post-stress EF 25%, new EKG changes from baseline abnormalities   Cancer [facility])    Prostate   CHF (congestive heart failure) (HCC) 2012   Hypertension 08/08/2021   Inguinal hernia, left    Pneumonia    November 2023   Pre-diabetes     Assessment: 67 yo male with history of HFrEF, CAD, VT, 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  Warfarin started on admission and held for procedures (last dose 9/2).  Pharmacy consulted to dose heparin.  Warfarin therapy held at this admission with anticipation of future procedures. Patient will continue on heparin infusion for now.  Heparin level is therapeutic at 0.52, on 1200 units/hr. Hgb 13.4, plt 168. No s/sx of bleeding or infusion issues.   Heparin level is therapeutic at 0.39 on 1200 units/hour at 03/07/23 03:17. The decrease in the heparin level is likely from the infusion being held previous day prior to RHC. Okay to continue same rate. Patient has no overt signs/symptoms of bleeding.  Goal of Therapy:  INR 2-3 Heparin level 0.3-0.7 units/ml Monitor platelets by anticoagulation protocol: Yes   Plan:  Continue heparin gtt at 1200 units/hr Daily heparin level, CBC, s/sx bleeding  Thank you for allowing pharmacy to participate in this patient's care,  Wilmer Floor, PharmD PGY2 Cardiology Pharmacy Resident 03/07/2023 2:57 PM  Please check AMION for all [facility] Pharmacy phone numbers After 10:00 PM, call Main Pharmacy [phone]

## 2023-03-08 ENCOUNTER — Inpatient Hospital Stay (HOSPITAL_COMMUNITY): Payer: 59

## 2023-03-08 ENCOUNTER — Other Ambulatory Visit: Payer: Self-pay

## 2023-03-08 ENCOUNTER — Encounter (HOSPITAL_COMMUNITY): Payer: Self-pay | Admitting: Internal Medicine

## 2023-03-08 ENCOUNTER — Inpatient Hospital Stay (HOSPITAL_COMMUNITY)
Admission: EM | Disposition: A | Discharge status: Rehabilitation Facility | Payer: Self-pay | Source: Home / Self Care | Attending: Cardiology

## 2023-03-08 DIAGNOSIS — I2510 Atherosclerotic heart disease of native coronary artery without angina pectoris: Secondary | ICD-10-CM

## 2023-03-08 DIAGNOSIS — I5042 Chronic combined systolic (congestive) and diastolic (congestive) heart failure: Secondary | ICD-10-CM

## 2023-03-08 DIAGNOSIS — N1831 Chronic kidney disease, stage 3a: Secondary | ICD-10-CM

## 2023-03-08 DIAGNOSIS — I255 Ischemic cardiomyopathy: Secondary | ICD-10-CM

## 2023-03-08 DIAGNOSIS — I130 Hypertensive heart and chronic kidney disease with heart failure and stage 1 through stage 4 chronic kidney disease, or unspecified chronic kidney disease: Secondary | ICD-10-CM

## 2023-03-08 DIAGNOSIS — I5043 Acute on chronic combined systolic (congestive) and diastolic (congestive) heart failure: Secondary | ICD-10-CM | POA: Diagnosis not present

## 2023-03-08 HISTORY — PX: TEE WITHOUT CARDIOVERSION: SHX5443

## 2023-03-08 HISTORY — PX: INSERTION OF IMPLANTABLE LEFT VENTRICULAR ASSIST DEVICE: SHX5866

## 2023-03-08 LAB — POCT I-STAT 7, (LYTES, BLD GAS, ICA,H+H)
Acid-Base Excess: 0 mmol/L (ref 0.0–2.0)
Acid-Base Excess: 0 mmol/L (ref 0.0–2.0)
Acid-base deficit: 5 mmol/L — ABNORMAL HIGH (ref 0.0–2.0)
Acid-base deficit: 3 mmol/L — ABNORMAL HIGH (ref 0.0–2.0)
Acid-base deficit: 4 mmol/L — ABNORMAL HIGH (ref 0.0–2.0)
Acid-base deficit: 1 mmol/L (ref 0.0–2.0)
Acid-base deficit: 1 mmol/L (ref 0.0–2.0)
Acid-base deficit: 1 mmol/L (ref 0.0–2.0)
Acid-base deficit: 3 mmol/L — ABNORMAL HIGH (ref 0.0–2.0)
Bicarbonate: 21 mmol/L (ref 20.0–28.0)
Bicarbonate: 22.1 mmol/L (ref 20.0–28.0)
Bicarbonate: 23.7 mmol/L (ref 20.0–28.0)
Bicarbonate: 26.2 mmol/L (ref 20.0–28.0)
Bicarbonate: 23.9 mmol/L (ref 20.0–28.0)
Bicarbonate: 25.1 mmol/L (ref 20.0–28.0)
Bicarbonate: 25.1 mmol/L (ref 20.0–28.0)
Bicarbonate: 22.4 mmol/L (ref 20.0–28.0)
Bicarbonate: 24.7 mmol/L (ref 20.0–28.0)
Calcium, Ion: 1.18 mmol/L (ref 1.15–1.40)
Calcium, Ion: 1.08 mmol/L — ABNORMAL LOW (ref 1.15–1.40)
Calcium, Ion: 1.1 mmol/L — ABNORMAL LOW (ref 1.15–1.40)
Calcium, Ion: 1.04 mmol/L — ABNORMAL LOW (ref 1.15–1.40)
Calcium, Ion: 1.81 mmol/L (ref 1.15–1.40)
Calcium, Ion: 1.26 mmol/L (ref 1.15–1.40)
Calcium, Ion: 1.23 mmol/L (ref 1.15–1.40)
Calcium, Ion: 1.17 mmol/L (ref 1.15–1.40)
Calcium, Ion: 1.12 mmol/L — ABNORMAL LOW (ref 1.15–1.40)
HCT: 36 % — ABNORMAL LOW (ref 39.0–52.0)
HCT: 26 % — ABNORMAL LOW (ref 39.0–52.0)
HCT: 27 % — ABNORMAL LOW (ref 39.0–52.0)
HCT: 26 % — ABNORMAL LOW (ref 39.0–52.0)
HCT: 26 % — ABNORMAL LOW (ref 39.0–52.0)
HCT: 26 % — ABNORMAL LOW (ref 39.0–52.0)
HCT: 24 % — ABNORMAL LOW (ref 39.0–52.0)
HCT: 25 % — ABNORMAL LOW (ref 39.0–52.0)
HCT: 24 % — ABNORMAL LOW (ref 39.0–52.0)
Hemoglobin: 12.2 g/dL — ABNORMAL LOW (ref 13.0–17.0)
Hemoglobin: 8.8 g/dL — ABNORMAL LOW (ref 13.0–17.0)
Hemoglobin: 9.2 g/dL — ABNORMAL LOW (ref 13.0–17.0)
Hemoglobin: 8.8 g/dL — ABNORMAL LOW (ref 13.0–17.0)
Hemoglobin: 8.8 g/dL — ABNORMAL LOW (ref 13.0–17.0)
Hemoglobin: 8.8 g/dL — ABNORMAL LOW (ref 13.0–17.0)
Hemoglobin: 8.2 g/dL — ABNORMAL LOW (ref 13.0–17.0)
Hemoglobin: 8.5 g/dL — ABNORMAL LOW (ref 13.0–17.0)
Hemoglobin: 8.2 g/dL — ABNORMAL LOW (ref 13.0–17.0)
O2 Saturation: 100 %
O2 Saturation: 100 %
O2 Saturation: 100 %
O2 Saturation: 98 %
O2 Saturation: 99 %
O2 Saturation: 98 %
O2 Saturation: 99 %
O2 Saturation: 100 %
O2 Saturation: 100 %
Patient temperature: 36.7
Patient temperature: 36.3
Patient temperature: 35.7
Patient temperature: 36.1
Potassium: 4.2 mmol/L (ref 3.5–5.1)
Potassium: 4.4 mmol/L (ref 3.5–5.1)
Potassium: 4.5 mmol/L (ref 3.5–5.1)
Potassium: 5.3 mmol/L — ABNORMAL HIGH (ref 3.5–5.1)
Potassium: 3.9 mmol/L (ref 3.5–5.1)
Potassium: 3.7 mmol/L (ref 3.5–5.1)
Potassium: 3.8 mmol/L (ref 3.5–5.1)
Potassium: 4.4 mmol/L (ref 3.5–5.1)
Potassium: 4.7 mmol/L (ref 3.5–5.1)
Sodium: 134 mmol/L — ABNORMAL LOW (ref 135–145)
Sodium: 135 mmol/L (ref 135–145)
Sodium: 135 mmol/L (ref 135–145)
Sodium: 134 mmol/L — ABNORMAL LOW (ref 135–145)
Sodium: 137 mmol/L (ref 135–145)
Sodium: 139 mmol/L (ref 135–145)
Sodium: 138 mmol/L (ref 135–145)
Sodium: 135 mmol/L (ref 135–145)
Sodium: 138 mmol/L (ref 135–145)
TCO2: 22 mmol/L (ref 22–32)
TCO2: 23 mmol/L (ref 22–32)
TCO2: 25 mmol/L (ref 22–32)
TCO2: 28 mmol/L (ref 22–32)
TCO2: 25 mmol/L (ref 22–32)
TCO2: 27 mmol/L (ref 22–32)
TCO2: 27 mmol/L (ref 22–32)
TCO2: 24 mmol/L (ref 22–32)
TCO2: 26 mmol/L (ref 22–32)
pCO2 arterial: 43.2 mmHg (ref 32–48)
pCO2 arterial: 42.2 mmHg (ref 32–48)
pCO2 arterial: 47.2 mmHg (ref 32–48)
pCO2 arterial: 49.1 mmHg — ABNORMAL HIGH (ref 32–48)
pCO2 arterial: 41 mmHg (ref 32–48)
pCO2 arterial: 49.4 mmHg — ABNORMAL HIGH (ref 32–48)
pCO2 arterial: 46.1 mmHg (ref 32–48)
pCO2 arterial: 37.1 mmHg (ref 32–48)
pCO2 arterial: 38.1 mmHg (ref 32–48)
pH, Arterial: 7.295 — ABNORMAL LOW (ref 7.35–7.45)
pH, Arterial: 7.309 — ABNORMAL LOW (ref 7.35–7.45)
pH, Arterial: 7.327 — ABNORMAL LOW (ref 7.35–7.45)
pH, Arterial: 7.336 — ABNORMAL LOW (ref 7.35–7.45)
pH, Arterial: 7.374 (ref 7.35–7.45)
pH, Arterial: 7.313 — ABNORMAL LOW (ref 7.35–7.45)
pH, Arterial: 7.34 — ABNORMAL LOW (ref 7.35–7.45)
pH, Arterial: 7.383 (ref 7.35–7.45)
pH, Arterial: 7.415 (ref 7.35–7.45)
pO2, Arterial: 266 mmHg — ABNORMAL HIGH (ref 83–108)
pO2, Arterial: 325 mmHg — ABNORMAL HIGH (ref 83–108)
pO2, Arterial: 333 mmHg — ABNORMAL HIGH (ref 83–108)
pO2, Arterial: 113 mmHg — ABNORMAL HIGH (ref 83–108)
pO2, Arterial: 134 mmHg — ABNORMAL HIGH (ref 83–108)
pO2, Arterial: 110 mmHg — ABNORMAL HIGH (ref 83–108)
pO2, Arterial: 150 mmHg — ABNORMAL HIGH (ref 83–108)
pO2, Arterial: 166 mmHg — ABNORMAL HIGH (ref 83–108)
pO2, Arterial: 187 mmHg — ABNORMAL HIGH (ref 83–108)

## 2023-03-08 LAB — POCT I-STAT, CHEM 8
BUN: 26 mg/dL — ABNORMAL HIGH (ref 8–23)
BUN: 27 mg/dL — ABNORMAL HIGH (ref 8–23)
BUN: 26 mg/dL — ABNORMAL HIGH (ref 8–23)
BUN: 26 mg/dL — ABNORMAL HIGH (ref 8–23)
BUN: 35 mg/dL — ABNORMAL HIGH (ref 8–23)
Calcium, Ion: 1.17 mmol/L (ref 1.15–1.40)
Calcium, Ion: 1.18 mmol/L (ref 1.15–1.40)
Calcium, Ion: 1.1 mmol/L — ABNORMAL LOW (ref 1.15–1.40)
Calcium, Ion: 0.98 mmol/L — ABNORMAL LOW (ref 1.15–1.40)
Calcium, Ion: 1.03 mmol/L — ABNORMAL LOW (ref 1.15–1.40)
Chloride: 102 mmol/L (ref 98–111)
Chloride: 104 mmol/L (ref 98–111)
Chloride: 100 mmol/L (ref 98–111)
Chloride: 99 mmol/L (ref 98–111)
Chloride: 100 mmol/L (ref 98–111)
Creatinine, Ser: 1.2 mg/dL (ref 0.61–1.24)
Creatinine, Ser: 1.3 mg/dL — ABNORMAL HIGH (ref 0.61–1.24)
Creatinine, Ser: 1.1 mg/dL (ref 0.61–1.24)
Creatinine, Ser: 1.3 mg/dL — ABNORMAL HIGH (ref 0.61–1.24)
Creatinine, Ser: 1.2 mg/dL (ref 0.61–1.24)
Glucose, Bld: 137 mg/dL — ABNORMAL HIGH (ref 70–99)
Glucose, Bld: 160 mg/dL — ABNORMAL HIGH (ref 70–99)
Glucose, Bld: 160 mg/dL — ABNORMAL HIGH (ref 70–99)
Glucose, Bld: 231 mg/dL — ABNORMAL HIGH (ref 70–99)
Glucose, Bld: 219 mg/dL — ABNORMAL HIGH (ref 70–99)
HCT: 32 % — ABNORMAL LOW (ref 39.0–52.0)
HCT: 36 % — ABNORMAL LOW (ref 39.0–52.0)
HCT: 27 % — ABNORMAL LOW (ref 39.0–52.0)
HCT: 26 % — ABNORMAL LOW (ref 39.0–52.0)
HCT: 27 % — ABNORMAL LOW (ref 39.0–52.0)
Hemoglobin: 10.9 g/dL — ABNORMAL LOW (ref 13.0–17.0)
Hemoglobin: 12.2 g/dL — ABNORMAL LOW (ref 13.0–17.0)
Hemoglobin: 9.2 g/dL — ABNORMAL LOW (ref 13.0–17.0)
Hemoglobin: 8.8 g/dL — ABNORMAL LOW (ref 13.0–17.0)
Hemoglobin: 9.2 g/dL — ABNORMAL LOW (ref 13.0–17.0)
Potassium: 4 mmol/L (ref 3.5–5.1)
Potassium: 4.3 mmol/L (ref 3.5–5.1)
Potassium: 4.5 mmol/L (ref 3.5–5.1)
Potassium: 4.4 mmol/L (ref 3.5–5.1)
Potassium: 5.4 mmol/L — ABNORMAL HIGH (ref 3.5–5.1)
Sodium: 134 mmol/L — ABNORMAL LOW (ref 135–145)
Sodium: 135 mmol/L (ref 135–145)
Sodium: 136 mmol/L (ref 135–145)
Sodium: 136 mmol/L (ref 135–145)
Sodium: 134 mmol/L — ABNORMAL LOW (ref 135–145)
TCO2: 21 mmol/L — ABNORMAL LOW (ref 22–32)
TCO2: 21 mmol/L — ABNORMAL LOW (ref 22–32)
TCO2: 25 mmol/L (ref 22–32)
TCO2: 25 mmol/L (ref 22–32)
TCO2: 24 mmol/L (ref 22–32)

## 2023-03-08 LAB — CBC
HCT: 38.2 % — ABNORMAL LOW (ref 39.0–52.0)
HCT: 24.6 % — ABNORMAL LOW (ref 39.0–52.0)
HCT: 24.7 % — ABNORMAL LOW (ref 39.0–52.0)
Hemoglobin: 12.6 g/dL — ABNORMAL LOW (ref 13.0–17.0)
Hemoglobin: 8.1 g/dL — ABNORMAL LOW (ref 13.0–17.0)
Hemoglobin: 8.2 g/dL — ABNORMAL LOW (ref 13.0–17.0)
MCH: 31 pg (ref 26.0–34.0)
MCH: 30.9 pg (ref 26.0–34.0)
MCH: 30.7 pg (ref 26.0–34.0)
MCHC: 33 g/dL (ref 30.0–36.0)
MCHC: 32.9 g/dL (ref 30.0–36.0)
MCHC: 33.2 g/dL (ref 30.0–36.0)
MCV: 93.9 fL (ref 80.0–100.0)
MCV: 93.9 fL (ref 80.0–100.0)
MCV: 92.5 fL (ref 80.0–100.0)
Platelets: 161 10*3/uL (ref 150–400)
Platelets: 87 10*3/uL — ABNORMAL LOW (ref 150–400)
Platelets: 108 10*3/uL — ABNORMAL LOW (ref 150–400)
RBC: 4.07 MIL/uL — ABNORMAL LOW (ref 4.22–5.81)
RBC: 2.62 MIL/uL — ABNORMAL LOW (ref 4.22–5.81)
RBC: 2.67 MIL/uL — ABNORMAL LOW (ref 4.22–5.81)
RDW: 13.5 % (ref 11.5–15.5)
RDW: 14.2 % (ref 11.5–15.5)
RDW: 14.4 % (ref 11.5–15.5)
WBC: 9.9 10*3/uL (ref 4.0–10.5)
WBC: 14.5 10*3/uL — ABNORMAL HIGH (ref 4.0–10.5)
WBC: 13.1 10*3/uL — ABNORMAL HIGH (ref 4.0–10.5)
nRBC: 0 % (ref 0.0–0.2)
nRBC: 0 % (ref 0.0–0.2)
nRBC: 0 % (ref 0.0–0.2)

## 2023-03-08 LAB — POCT I-STAT EG7
Acid-Base Excess: 28 mmol/L — ABNORMAL HIGH (ref 0.0–2.0)
Acid-base deficit: 3 mmol/L — ABNORMAL HIGH (ref 0.0–2.0)
Bicarbonate: 51.9 mmol/L — ABNORMAL HIGH (ref 20.0–28.0)
Bicarbonate: 24.2 mmol/L (ref 20.0–28.0)
Calcium, Ion: 0.61 mmol/L — CL (ref 1.15–1.40)
Calcium, Ion: 1.1 mmol/L — ABNORMAL LOW (ref 1.15–1.40)
HCT: 19 % — ABNORMAL LOW (ref 39.0–52.0)
HCT: 27 % — ABNORMAL LOW (ref 39.0–52.0)
Hemoglobin: 6.5 g/dL — CL (ref 13.0–17.0)
Hemoglobin: 9.2 g/dL — ABNORMAL LOW (ref 13.0–17.0)
O2 Saturation: 77 %
O2 Saturation: 73 %
Potassium: 2.6 mmol/L — CL (ref 3.5–5.1)
Potassium: 4.5 mmol/L (ref 3.5–5.1)
Sodium: 172 mmol/L (ref 135–145)
Sodium: 136 mmol/L (ref 135–145)
TCO2: >50 mmol/L — ABNORMAL HIGH (ref 22–32)
TCO2: 26 mmol/L (ref 22–32)
pCO2, Ven: 52.5 mmHg (ref 44–60)
pCO2, Ven: 51 mmHg (ref 44–60)
pH, Ven: 7.603 (ref 7.25–7.43)
pH, Ven: 7.284 (ref 7.25–7.43)
pO2, Ven: 36 mmHg (ref 32–45)
pO2, Ven: 43 mmHg (ref 32–45)

## 2023-03-08 LAB — ECHO INTRAOPERATIVE TEE
Height: 69 in
Weight: 2490.32 [oz_av]

## 2023-03-08 LAB — PLATELET COUNT: Platelets: 104 10*3/uL — ABNORMAL LOW (ref 150–400)

## 2023-03-08 LAB — BASIC METABOLIC PANEL
Anion gap: 6 (ref 5–15)
Anion gap: 8 (ref 5–15)
Anion gap: 10 (ref 5–15)
BUN: 33 mg/dL — ABNORMAL HIGH (ref 8–23)
BUN: 21 mg/dL (ref 8–23)
BUN: 19 mg/dL (ref 8–23)
CO2: 23 mmol/L (ref 22–32)
CO2: 25 mmol/L (ref 22–32)
CO2: 23 mmol/L (ref 22–32)
Calcium: 8.7 mg/dL — ABNORMAL LOW (ref 8.9–10.3)
Calcium: 8.3 mg/dL — ABNORMAL LOW (ref 8.9–10.3)
Calcium: 8.1 mg/dL — ABNORMAL LOW (ref 8.9–10.3)
Chloride: 104 mmol/L (ref 98–111)
Chloride: 103 mmol/L (ref 98–111)
Chloride: 103 mmol/L (ref 98–111)
Creatinine, Ser: 1.39 mg/dL — ABNORMAL HIGH (ref 0.61–1.24)
Creatinine, Ser: 1.21 mg/dL (ref 0.61–1.24)
Creatinine, Ser: 1.26 mg/dL — ABNORMAL HIGH (ref 0.61–1.24)
GFR, Estimated: 55 mL/min — ABNORMAL LOW (ref >60)
GFR, Estimated: >60 mL/min (ref >60)
GFR, Estimated: >60 mL/min (ref >60)
Glucose, Bld: 123 mg/dL — ABNORMAL HIGH (ref 70–99)
Glucose, Bld: 70 mg/dL (ref 70–99)
Glucose, Bld: 125 mg/dL — ABNORMAL HIGH (ref 70–99)
Potassium: 4.3 mmol/L (ref 3.5–5.1)
Potassium: 3.6 mmol/L (ref 3.5–5.1)
Potassium: 4.6 mmol/L (ref 3.5–5.1)
Sodium: 133 mmol/L — ABNORMAL LOW (ref 135–145)
Sodium: 136 mmol/L (ref 135–145)
Sodium: 136 mmol/L (ref 135–145)

## 2023-03-08 LAB — GLUCOSE, CAPILLARY
Glucose-Capillary: 79 mg/dL (ref 70–99)
Glucose-Capillary: 148 mg/dL — ABNORMAL HIGH (ref 70–99)
Glucose-Capillary: 54 mg/dL — ABNORMAL LOW (ref 70–99)
Glucose-Capillary: 147 mg/dL — ABNORMAL HIGH (ref 70–99)
Glucose-Capillary: 157 mg/dL — ABNORMAL HIGH (ref 70–99)
Glucose-Capillary: 133 mg/dL — ABNORMAL HIGH (ref 70–99)
Glucose-Capillary: 114 mg/dL — ABNORMAL HIGH (ref 70–99)
Glucose-Capillary: 127 mg/dL — ABNORMAL HIGH (ref 70–99)
Glucose-Capillary: 111 mg/dL — ABNORMAL HIGH (ref 70–99)

## 2023-03-08 LAB — PROTIME-INR
INR: 1.3 — ABNORMAL HIGH (ref 0.8–1.2)
INR: 1.5 — ABNORMAL HIGH (ref 0.8–1.2)
Prothrombin Time: 16.3 s — ABNORMAL HIGH (ref 11.4–15.2)
Prothrombin Time: 18.3 s — ABNORMAL HIGH (ref 11.4–15.2)

## 2023-03-08 LAB — PREPARE RBC (CROSSMATCH)

## 2023-03-08 LAB — FACTOR 5 LEIDEN

## 2023-03-08 LAB — DIC (DISSEMINATED INTRAVASCULAR COAGULATION)PANEL
D-Dimer, Quant: 5.18 ug{FEU}/mL — ABNORMAL HIGH (ref 0.00–0.50)
Fibrinogen: 338 mg/dL (ref 210–475)
INR: 1.6 — ABNORMAL HIGH (ref 0.8–1.2)
Platelets: 85 10*3/uL — ABNORMAL LOW (ref 150–400)
Prothrombin Time: 19.2 s — ABNORMAL HIGH (ref 11.4–15.2)
Smear Review: NONE SEEN
aPTT: 33 s (ref 24–36)

## 2023-03-08 LAB — COOXEMETRY PANEL
Carboxyhemoglobin: 2 % — ABNORMAL HIGH (ref 0.5–1.5)
Carboxyhemoglobin: 1.8 % — ABNORMAL HIGH (ref 0.5–1.5)
Methemoglobin: 0.9 % (ref 0.0–1.5)
Methemoglobin: <0.7 % (ref 0.0–1.5)
O2 Saturation: 66.4 %
O2 Saturation: 77.1 %
Total hemoglobin: 13.1 g/dL (ref 12.0–16.0)
Total hemoglobin: 8 g/dL — ABNORMAL LOW (ref 12.0–16.0)

## 2023-03-08 LAB — APTT
aPTT: 103 s — ABNORMAL HIGH (ref 24–36)
aPTT: 37 s — ABNORMAL HIGH (ref 24–36)

## 2023-03-08 LAB — HEPARIN LEVEL (UNFRACTIONATED): Heparin Unfractionated: 0.44 [IU]/mL (ref 0.30–0.70)

## 2023-03-08 LAB — MAGNESIUM
Magnesium: 2.1 mg/dL (ref 1.7–2.4)
Magnesium: 1.7 mg/dL (ref 1.7–2.4)
Magnesium: 2.7 mg/dL — ABNORMAL HIGH (ref 1.7–2.4)

## 2023-03-08 LAB — HEMOGLOBIN AND HEMATOCRIT, BLOOD
HCT: 23.3 % — ABNORMAL LOW (ref 39.0–52.0)
Hemoglobin: 7.7 g/dL — ABNORMAL LOW (ref 13.0–17.0)

## 2023-03-08 LAB — FIBRINOGEN: Fibrinogen: 296 mg/dL (ref 210–475)

## 2023-03-08 SURGERY — INSERTION OF IMPLANTABLE LEFT VENTRICULAR ASSIST DEVICE
Anesthesia: General | Site: Chest

## 2023-03-08 MED ORDER — VANCOMYCIN HCL IN DEXTROSE 1-5 GM/200ML-% IV SOLN
1250.0000 mg | INTRAVENOUS | Status: DC
  Administered 2023-03-09: 1250 mg via INTRAVENOUS
  Filled 2023-03-08: qty 400

## 2023-03-08 MED ORDER — DEXTROSE 50 % IV SOLN
0.0000 mL | INTRAVENOUS | Status: DC | PRN
  Filled 2023-03-08: qty 50

## 2023-03-08 MED ORDER — MAGNESIUM SULFATE 4 GM/100ML IV SOLN
4.0000 g | Freq: Once | INTRAVENOUS | Status: AC
  Administered 2023-03-08: 4 g via INTRAVENOUS
  Filled 2023-03-08: qty 100

## 2023-03-08 MED ORDER — DOCUSATE SODIUM 100 MG PO CAPS
200.0000 mg | ORAL_CAPSULE | Freq: Every day | ORAL | Status: DC
  Administered 2023-03-09 – 2023-03-18 (×10): 200 mg via ORAL
  Filled 2023-03-08 (×12): qty 2

## 2023-03-08 MED ORDER — HEPARIN 6000 UNIT IRRIGATION SOLUTION
Status: AC
  Filled 2023-03-08: qty 500

## 2023-03-08 MED ORDER — LACTATED RINGERS IV SOLN: INTRAVENOUS | Status: DC | PRN

## 2023-03-08 MED ORDER — CHLORHEXIDINE GLUCONATE 0.12 % MT SOLN
15.0000 mL | OROMUCOSAL | Status: AC
  Administered 2023-03-08: 15 mL via OROMUCOSAL
  Filled 2023-03-08: qty 15

## 2023-03-08 MED ORDER — SODIUM CHLORIDE 0.9 % IV SOLN
600.0000 mg | Freq: Once | INTRAVENOUS | Status: AC
  Administered 2023-03-09: 600 mg via INTRAVENOUS
  Filled 2023-03-08: qty 10

## 2023-03-08 MED ORDER — PROTAMINE SULFATE 10 MG/ML IV SOLN
INTRAVENOUS | Status: DC | PRN
  Administered 2023-03-08: 250 mg via INTRAVENOUS

## 2023-03-08 MED ORDER — ASPIRIN 325 MG PO TBEC
325.0000 mg | DELAYED_RELEASE_TABLET | Freq: Every day | ORAL | Status: DC
  Administered 2023-03-10: 325 mg via ORAL
  Filled 2023-03-08: qty 1

## 2023-03-08 MED ORDER — VASOPRESSIN 20 UNIT/ML IV SOLN
INTRAVENOUS | Status: DC | PRN
Start: 2023-03-08 — End: 2023-03-08
  Administered 2023-03-08: 1 [IU] via INTRAVENOUS
  Administered 2023-03-08 (×2): 2 [IU] via INTRAVENOUS
  Administered 2023-03-08: 1 [IU] via INTRAVENOUS

## 2023-03-08 MED ORDER — PROPOFOL 10 MG/ML IV BOLUS
INTRAVENOUS | Status: AC
  Filled 2023-03-08: qty 20

## 2023-03-08 MED ORDER — PROPOFOL 10 MG/ML IV BOLUS
INTRAVENOUS | Status: DC | PRN
  Administered 2023-03-08: 20 mg via INTRAVENOUS

## 2023-03-08 MED ORDER — CALCIUM CHLORIDE 10 % IV SOLN
INTRAVENOUS | Status: DC | PRN
  Administered 2023-03-08: 400 mg via INTRAVENOUS
  Administered 2023-03-08 (×3): 200 mg via INTRAVENOUS

## 2023-03-08 MED ORDER — SODIUM CHLORIDE 0.9 % IV SOLN: INTRAVENOUS | Status: DC | PRN

## 2023-03-08 MED ORDER — EPINEPHRINE 1 MG/10ML IJ SOSY
PREFILLED_SYRINGE | INTRAMUSCULAR | Status: DC | PRN
  Administered 2023-03-08 (×2): 10 ug via INTRAVENOUS

## 2023-03-08 MED ORDER — THROMBIN (RECOMBINANT) 20000 UNITS EX SOLR
CUTANEOUS | Status: AC
  Filled 2023-03-08: qty 20000

## 2023-03-08 MED ORDER — VANCOMYCIN HCL 1000 MG IV SOLR
INTRAVENOUS | Status: DC | PRN
  Administered 2023-03-08: 1000 mg via TOPICAL

## 2023-03-08 MED ORDER — NOREPINEPHRINE 4 MG/250ML-% IV SOLN: 0.0000 ug/min | INTRAVENOUS | Status: DC

## 2023-03-08 MED ORDER — SODIUM CHLORIDE (PF) 0.9 % IJ SOLN
INTRAMUSCULAR | Status: DC | PRN
  Administered 2023-03-08: 1000 mL

## 2023-03-08 MED ORDER — HEPARIN 6000 UNIT IRRIGATION SOLUTION
Status: DC | PRN
Start: 2023-03-08 — End: 2023-03-08
  Administered 2023-03-08: 1

## 2023-03-08 MED ORDER — TRAMADOL HCL 50 MG PO TABS
50.0000 mg | ORAL_TABLET | ORAL | Status: DC | PRN
  Administered 2023-03-10 – 2023-03-11 (×2): 50 mg via ORAL
  Filled 2023-03-08 (×2): qty 1

## 2023-03-08 MED ORDER — POTASSIUM CHLORIDE 10 MEQ/50ML IV SOLN
10.0000 meq | INTRAVENOUS | Status: AC
  Administered 2023-03-08 (×3): 10 meq via INTRAVENOUS

## 2023-03-08 MED ORDER — ORAL CARE MOUTH RINSE: 15.0000 mL | OROMUCOSAL | Status: DC | PRN

## 2023-03-08 MED ORDER — LACTATED RINGERS IV SOLN: 500.0000 mL | Freq: Once | INTRAVENOUS | Status: DC | PRN

## 2023-03-08 MED ORDER — SODIUM CHLORIDE 0.9% FLUSH: 3.0000 mL | INTRAVENOUS | Status: DC | PRN

## 2023-03-08 MED ORDER — VASOPRESSIN 20 UNITS/100 ML INFUSION FOR SHOCK
0.0400 [IU]/min | INTRAVENOUS | Status: DC
  Administered 2023-03-08: 0.04 [IU]/min via INTRAVENOUS
  Administered 2023-03-09: 0.02 [IU]/min via INTRAVENOUS
  Administered 2023-03-09: 0.04 [IU]/min via INTRAVENOUS
  Filled 2023-03-08 (×3): qty 100

## 2023-03-08 MED ORDER — METOCLOPRAMIDE HCL 5 MG/ML IJ SOLN
10.0000 mg | Freq: Four times a day (QID) | INTRAMUSCULAR | Status: AC
  Administered 2023-03-08 – 2023-03-13 (×16): 10 mg via INTRAVENOUS
  Filled 2023-03-08 (×17): qty 2

## 2023-03-08 MED ORDER — DEXTROSE 50 % IV SOLN
25.0000 g | INTRAVENOUS | Status: AC
  Administered 2023-03-08: 25 g via INTRAVENOUS

## 2023-03-08 MED ORDER — THROMBIN 20000 UNITS EX SOLR
CUTANEOUS | Status: DC | PRN
  Administered 2023-03-08: 10 mL via TOPICAL

## 2023-03-08 MED ORDER — FLUCONAZOLE IN SODIUM CHLORIDE 400-0.9 MG/200ML-% IV SOLN
400.0000 mg | Freq: Once | INTRAVENOUS | Status: AC
  Administered 2023-03-09: 400 mg via INTRAVENOUS
  Filled 2023-03-08: qty 200

## 2023-03-08 MED ORDER — CEFAZOLIN SODIUM-DEXTROSE 2-4 GM/100ML-% IV SOLN
2.0000 g | Freq: Three times a day (TID) | INTRAVENOUS | Status: AC
  Administered 2023-03-08 – 2023-03-10 (×6): 2 g via INTRAVENOUS
  Filled 2023-03-08 (×6): qty 100

## 2023-03-08 MED ORDER — ALBUMIN HUMAN 5 % IV SOLN
250.0000 mL | INTRAVENOUS | Status: AC | PRN
  Administered 2023-03-08 – 2023-03-09 (×4): 12.5 g via INTRAVENOUS
  Filled 2023-03-08 (×2): qty 250

## 2023-03-08 MED ORDER — ROCURONIUM BROMIDE 10 MG/ML (PF) SYRINGE
PREFILLED_SYRINGE | INTRAVENOUS | Status: AC
  Filled 2023-03-08: qty 10

## 2023-03-08 MED ORDER — SODIUM CHLORIDE 0.9 % IV SOLN: INTRAVENOUS | Status: DC

## 2023-03-08 MED ORDER — MIDAZOLAM HCL 2 MG/2ML IJ SOLN: 2.0000 mg | INTRAMUSCULAR | Status: DC | PRN

## 2023-03-08 MED ORDER — FENTANYL 2500MCG IN NS 250ML (10MCG/ML) PREMIX INFUSION
0.0000 ug/h | INTRAVENOUS | Status: DC
  Administered 2023-03-08: 75 ug/h via INTRAVENOUS
  Filled 2023-03-08: qty 250

## 2023-03-08 MED ORDER — FENTANYL CITRATE (PF) 250 MCG/5ML IJ SOLN
INTRAMUSCULAR | Status: AC
  Filled 2023-03-08: qty 5

## 2023-03-08 MED ORDER — HEPARIN SODIUM (PORCINE) 1000 UNIT/ML IJ SOLN
INTRAMUSCULAR | Status: DC | PRN
  Administered 2023-03-08: 25000 [IU] via INTRAVENOUS

## 2023-03-08 MED ORDER — MORPHINE SULFATE (PF) 2 MG/ML IV SOLN
1.0000 mg | INTRAVENOUS | Status: DC | PRN
  Administered 2023-03-11: 2 mg via INTRAVENOUS
  Filled 2023-03-08: qty 1

## 2023-03-08 MED ORDER — LACTATED RINGERS IV SOLN: INTRAVENOUS | Status: DC

## 2023-03-08 MED ORDER — ROCURONIUM BROMIDE 10 MG/ML (PF) SYRINGE
PREFILLED_SYRINGE | INTRAVENOUS | Status: DC | PRN
  Administered 2023-03-08: 10 mg via INTRAVENOUS
  Administered 2023-03-08: 50 mg via INTRAVENOUS
  Administered 2023-03-08: 100 mg via INTRAVENOUS
  Administered 2023-03-08: 50 mg via INTRAVENOUS

## 2023-03-08 MED ORDER — OXYCODONE HCL 5 MG PO TABS
5.0000 mg | ORAL_TABLET | ORAL | Status: DC | PRN
  Administered 2023-03-11: 5 mg via ORAL
  Filled 2023-03-08: qty 1

## 2023-03-08 MED ORDER — MIDAZOLAM HCL (PF) 5 MG/ML IJ SOLN
INTRAMUSCULAR | Status: DC | PRN
  Administered 2023-03-08 (×2): 1 mg via INTRAVENOUS
  Administered 2023-03-08: 2 mg via INTRAVENOUS

## 2023-03-08 MED ORDER — MIDAZOLAM HCL (PF) 10 MG/2ML IJ SOLN
INTRAMUSCULAR | Status: AC
  Filled 2023-03-08: qty 2

## 2023-03-08 MED ORDER — FENTANYL CITRATE (PF) 250 MCG/5ML IJ SOLN
INTRAMUSCULAR | Status: DC | PRN
  Administered 2023-03-08 (×2): 50 ug via INTRAVENOUS
  Administered 2023-03-08: 250 ug via INTRAVENOUS
  Administered 2023-03-08 (×2): 50 ug via INTRAVENOUS

## 2023-03-08 MED ORDER — SODIUM BICARBONATE 8.4 % IV SOLN
50.0000 meq | Freq: Once | INTRAVENOUS | Status: AC
  Administered 2023-03-08: 50 meq via INTRAVENOUS

## 2023-03-08 MED ORDER — FAMOTIDINE IN NACL 20-0.9 MG/50ML-% IV SOLN
20.0000 mg | Freq: Two times a day (BID) | INTRAVENOUS | Status: AC
  Administered 2023-03-08 (×2): 20 mg via INTRAVENOUS
  Filled 2023-03-08 (×2): qty 50

## 2023-03-08 MED ORDER — VANCOMYCIN HCL 1000 MG IV SOLR
INTRAVENOUS | Status: AC
  Filled 2023-03-08: qty 20

## 2023-03-08 MED ORDER — 0.9 % SODIUM CHLORIDE (POUR BTL) OPTIME
TOPICAL | Status: DC | PRN
  Administered 2023-03-08: 5000 mL

## 2023-03-08 MED ORDER — INSULIN REGULAR(HUMAN) IN NACL 100-0.9 UT/100ML-% IV SOLN: INTRAVENOUS | Status: DC

## 2023-03-08 MED ORDER — DEXMEDETOMIDINE HCL IN NACL 400 MCG/100ML IV SOLN
0.0000 ug/kg/h | INTRAVENOUS | Status: DC
  Administered 2023-03-08 – 2023-03-09 (×2): 0.7 ug/kg/h via INTRAVENOUS
  Filled 2023-03-08 (×2): qty 100

## 2023-03-08 MED ORDER — PANTOPRAZOLE SODIUM 40 MG PO TBEC
40.0000 mg | DELAYED_RELEASE_TABLET | Freq: Every day | ORAL | Status: DC
  Administered 2023-03-09 – 2023-03-20 (×12): 40 mg via ORAL
  Filled 2023-03-08 (×12): qty 1

## 2023-03-08 MED ORDER — ASPIRIN 300 MG RE SUPP: 300.0000 mg | Freq: Every day | RECTAL | Status: DC

## 2023-03-08 MED ORDER — ORAL CARE MOUTH RINSE
15.0000 mL | OROMUCOSAL | Status: DC
  Administered 2023-03-08 – 2023-03-09 (×11): 15 mL via OROMUCOSAL

## 2023-03-08 MED ORDER — LIDOCAINE 2% (20 MG/ML) 5 ML SYRINGE
INTRAMUSCULAR | Status: AC
  Filled 2023-03-08: qty 5

## 2023-03-08 MED ORDER — ASPIRIN 81 MG PO CHEW
324.0000 mg | CHEWABLE_TABLET | Freq: Every day | ORAL | Status: DC
  Administered 2023-03-09: 324 mg
  Filled 2023-03-08: qty 4

## 2023-03-08 MED ORDER — SODIUM CHLORIDE 0.45 % IV SOLN: INTRAVENOUS | Status: DC | PRN

## 2023-03-08 MED ORDER — SODIUM CHLORIDE 0.9% IV SOLUTION: Freq: Once | INTRAVENOUS | Status: DC

## 2023-03-08 MED ORDER — ONDANSETRON HCL 4 MG/2ML IJ SOLN: 4.0000 mg | Freq: Four times a day (QID) | INTRAMUSCULAR | Status: DC | PRN

## 2023-03-08 MED ORDER — EPINEPHRINE HCL 5 MG/250ML IV SOLN IN NS
0.0000 ug/min | INTRAVENOUS | Status: DC
  Administered 2023-03-09: 5 ug/min via INTRAVENOUS
  Filled 2023-03-08: qty 250

## 2023-03-08 MED ORDER — CHLORHEXIDINE GLUCONATE CLOTH 2 % EX PADS
6.0000 | MEDICATED_PAD | Freq: Every day | CUTANEOUS | Status: DC
  Administered 2023-03-08 – 2023-03-20 (×13): 6 via TOPICAL

## 2023-03-08 MED ORDER — SODIUM CHLORIDE 0.9% FLUSH
3.0000 mL | Freq: Two times a day (BID) | INTRAVENOUS | Status: DC
  Administered 2023-03-09 – 2023-03-20 (×19): 3 mL via INTRAVENOUS

## 2023-03-08 MED ORDER — HEMOSTATIC AGENTS (NO CHARGE) OPTIME
TOPICAL | Status: DC | PRN
Start: 2023-03-08 — End: 2023-03-08
  Administered 2023-03-08 (×6): 1 via TOPICAL

## 2023-03-08 MED ORDER — LIDOCAINE 2% (20 MG/ML) 5 ML SYRINGE
INTRAMUSCULAR | Status: DC | PRN
Start: 2023-03-08 — End: 2023-03-08
  Administered 2023-03-08: 100 mg via INTRAVENOUS

## 2023-03-08 MED ORDER — SODIUM CHLORIDE 0.9% IV SOLUTION: Freq: Once | INTRAVENOUS | Status: AC

## 2023-03-08 MED ORDER — ACETAMINOPHEN 500 MG PO TABS
1000.0000 mg | ORAL_TABLET | Freq: Four times a day (QID) | ORAL | Status: AC
  Administered 2023-03-09 – 2023-03-13 (×17): 1000 mg via ORAL
  Filled 2023-03-08 (×19): qty 2

## 2023-03-08 MED ORDER — ETOMIDATE 2 MG/ML IV SOLN
INTRAVENOUS | Status: AC
  Filled 2023-03-08: qty 10

## 2023-03-08 MED ORDER — BISACODYL 5 MG PO TBEC
10.0000 mg | DELAYED_RELEASE_TABLET | Freq: Every day | ORAL | Status: DC
  Administered 2023-03-10 – 2023-03-18 (×9): 10 mg via ORAL
  Filled 2023-03-08 (×11): qty 2

## 2023-03-08 MED ORDER — ACETAMINOPHEN 650 MG RE SUPP
650.0000 mg | Freq: Once | RECTAL | Status: AC
  Administered 2023-03-08: 650 mg via RECTAL

## 2023-03-08 MED ORDER — ACETAMINOPHEN 160 MG/5ML PO SOLN
1000.0000 mg | Freq: Four times a day (QID) | ORAL | Status: AC
  Administered 2023-03-08 – 2023-03-09 (×2): 1000 mg
  Filled 2023-03-08 (×2): qty 40.6

## 2023-03-08 MED ORDER — SODIUM CHLORIDE 0.9 % IV SOLN: 250.0000 mL | INTRAVENOUS | Status: DC

## 2023-03-08 MED ORDER — ALBUMIN HUMAN 5 % IV SOLN: INTRAVENOUS | Status: DC | PRN

## 2023-03-08 MED ORDER — MILRINONE LACTATE IN DEXTROSE 20-5 MG/100ML-% IV SOLN
0.1250 ug/kg/min | INTRAVENOUS | Status: DC
  Administered 2023-03-09 – 2023-03-11 (×3): 0.125 ug/kg/min via INTRAVENOUS
  Filled 2023-03-08 (×3): qty 100

## 2023-03-08 MED ORDER — VASOPRESSIN 20 UNIT/ML IV SOLN
INTRAVENOUS | Status: AC
  Filled 2023-03-08: qty 1

## 2023-03-08 MED ORDER — BISACODYL 10 MG RE SUPP: 10.0000 mg | Freq: Every day | RECTAL | Status: DC

## 2023-03-08 MED ORDER — ACETAMINOPHEN 160 MG/5ML PO SOLN: 650.0000 mg | Freq: Once | ORAL | Status: AC

## 2023-03-08 SURGICAL SUPPLY — 130 items
ADAPTER CARDIO PERF ANTE/RETRO (ADAPTER) IMPLANT
ADH SKN CLS APL DERMABOND .7 (GAUZE/BANDAGES/DRESSINGS) ×2
ADPR PRFSN 84XANTGRD RTRGD (ADAPTER)
ANCHOR CATH FOLEY SECURE (MISCELLANEOUS) IMPLANT
APL PRP 5X4 STRL LF DISP 70% (MISCELLANEOUS) ×2
APL PRP STRL LF DISP 70% ISPRP (MISCELLANEOUS) ×2
APPLICATOR CHLORAPREP 3ML ORNG (MISCELLANEOUS) ×2 IMPLANT
BAG DECANTER FOR FLEXI CONT (MISCELLANEOUS) ×2 IMPLANT
BLADE CLIPPER SURG (BLADE) ×2 IMPLANT
BLADE STERNUM SYSTEM 6 (BLADE) ×2 IMPLANT
BLADE SURG 10 STRL SS (BLADE) IMPLANT
BLADE SURG 11 STRL SS (BLADE) IMPLANT
BLADE SURG 12 STRL SS (BLADE) ×2 IMPLANT
BLADE SURG 15 STRL LF DISP TIS (BLADE) IMPLANT
BLADE SURG 15 STRL SS (BLADE) ×2
BRUSH SCRUB EZ PLAIN DRY (MISCELLANEOUS) IMPLANT
CANISTER SUCT 3000ML PPV (MISCELLANEOUS) ×2 IMPLANT
CANNULA ARTERIAL NVNT 3/8 20FR (MISCELLANEOUS) ×2 IMPLANT
CANNULA FEM BIOMEDICUS 25FR (CANNULA) IMPLANT
CANNULA MC2 2 STG 36/46 NON-V (CANNULA) IMPLANT
CANNULA SUMP PERICARDIAL (CANNULA) ×2 IMPLANT
CANNULA VENOUS LOW PROF 34X46 (CANNULA) ×2 IMPLANT
CATH FOLEY 2WAY SLVR 5CC 14FR (CATHETERS) ×2 IMPLANT
CATH FOLEY 2WAY SLVR 5CC 16FR (CATHETERS) IMPLANT
CATH HEART VENT LEFT (CATHETERS) IMPLANT
CATH ROBINSON RED A/P 18FR (CATHETERS) ×4 IMPLANT
CATH THORACIC 28FR (CATHETERS) IMPLANT
CATH THORACIC 36FR (CATHETERS) IMPLANT
CATH THORACIC 36FR RT ANG (CATHETERS) IMPLANT
CHLORAPREP W/TINT 26 (MISCELLANEOUS) ×2 IMPLANT
CNTNR URN SCR LID CUP LEK RST (MISCELLANEOUS) IMPLANT
CONN DRAIN DUAL-IN SOMAVAC (TUBING) ×2
CONN ST 1/4X3/8 BEN (MISCELLANEOUS) IMPLANT
CONN ST 3/8 X 1/2 (MISCELLANEOUS) IMPLANT
CONN Y 3/8X3/8X3/8 BEN (MISCELLANEOUS) IMPLANT
CONNECTOR BLAKE 2:1 CARIO BLK (MISCELLANEOUS) IMPLANT
CONNECTOR DRAIN DUAL-IN SOMAVC (TUBING) IMPLANT
CONT SPEC 4OZ STRL OR WHT (MISCELLANEOUS) ×4
CONTAINER PROTECT SURGISLUSH (MISCELLANEOUS) IMPLANT
DERMABOND ADVANCED .7 DNX12 (GAUZE/BANDAGES/DRESSINGS) IMPLANT
DRAIN CHANNEL 19F RND (DRAIN) IMPLANT
DRAIN CONNECTOR BLAKE 1:1 (MISCELLANEOUS) IMPLANT
DRAPE HALF SHEET 40X57 (DRAPES) IMPLANT
DRAPE INCISE IOBAN 66X45 STRL (DRAPES) ×4 IMPLANT
DRAPE PERI GROIN 82X75IN TIB (DRAPES) ×2 IMPLANT
DRAPE WARM FLUID 44X44 (DRAPES) ×2 IMPLANT
DRSG COVADERM 4X14 (GAUZE/BANDAGES/DRESSINGS) ×2 IMPLANT
ELECT BLADE 4.0 EZ CLEAN MEGAD (MISCELLANEOUS) ×2
ELECT BLADE 6.5 EXT (BLADE) ×2 IMPLANT
ELECT CAUTERY BLADE 6.4 (BLADE) ×2 IMPLANT
ELECT REM PT RETURN 9FT ADLT (ELECTROSURGICAL) ×4
ELECTRODE BLDE 4.0 EZ CLN MEGD (MISCELLANEOUS) ×2 IMPLANT
ELECTRODE REM PT RTRN 9FT ADLT (ELECTROSURGICAL) ×6 IMPLANT
EVACUATOR SILICONE 100CC (DRAIN) IMPLANT
FELT TEFLON 1X6 (MISCELLANEOUS) IMPLANT
FELT TEFLON 6X6 (MISCELLANEOUS) ×2 IMPLANT
GAUZE 4X4 16PLY ~~LOC~~+RFID DBL (SPONGE) ×2 IMPLANT
GAUZE SPONGE 4X4 12PLY STRL (GAUZE/BANDAGES/DRESSINGS) ×4 IMPLANT
GEL ULTRASOUND 20GR AQUASONIC (MISCELLANEOUS) IMPLANT
GLOVE BIO SURGEON STRL SZ 6.5 (GLOVE) IMPLANT
GLOVE BIO SURGEON STRL SZ7 (GLOVE) IMPLANT
GLOVE BIO SURGEON STRL SZ7.5 (GLOVE) IMPLANT
GLOVE BIOGEL M 6.5 STRL (GLOVE) ×2 IMPLANT
GLOVE BIOGEL PI IND STRL 6.5 (GLOVE) IMPLANT
GLOVE BIOGEL PI IND STRL 7.5 (GLOVE) IMPLANT
GLOVE ECLIPSE 7.0 STRL STRAW (GLOVE) IMPLANT
GLOVE SS BIOGEL STRL SZ 6.5 (GLOVE) IMPLANT
GOWN STRL REUS W/ TWL LRG LVL3 (GOWN DISPOSABLE) ×8 IMPLANT
GOWN STRL REUS W/ TWL XL LVL3 (GOWN DISPOSABLE) ×4 IMPLANT
GOWN STRL REUS W/TWL LRG LVL3 (GOWN DISPOSABLE) ×10
GOWN STRL REUS W/TWL XL LVL3 (GOWN DISPOSABLE) ×2
HEMOSTAT POWDER SURGIFOAM 1G (HEMOSTASIS) ×8 IMPLANT
HEMOSTAT SURGICEL 2X14 (HEMOSTASIS) ×2 IMPLANT
INSERT FOGARTY XLG (MISCELLANEOUS) IMPLANT
KIT BASIN OR (CUSTOM PROCEDURE TRAY) ×2 IMPLANT
KIT DILATOR VASC 18G NDL (KITS) IMPLANT
KIT LVAD HEARTMATE 3 W-CNTRL (Prosthesis & Implant Heart) IMPLANT
KIT SUCTION CATH 14FR (SUCTIONS) ×2 IMPLANT
KIT TURNOVER KIT B (KITS) ×2 IMPLANT
LEAD PACING MYOCARDI (MISCELLANEOUS) IMPLANT
LINE VENT (MISCELLANEOUS) IMPLANT
NS IRRIG 1000ML POUR BTL (IV SOLUTION) ×10 IMPLANT
PACK OPEN HEART (CUSTOM PROCEDURE TRAY) ×2 IMPLANT
PAD ARMBOARD 7.5X6 YLW CONV (MISCELLANEOUS) ×4 IMPLANT
PAD DEFIB R2 (MISCELLANEOUS) ×2 IMPLANT
PAD ELECT DEFIB RADIOL ZOLL (MISCELLANEOUS) IMPLANT
PENCIL BUTTON HOLSTER BLD 10FT (ELECTRODE) IMPLANT
POSITIONER HEAD DONUT 9IN (MISCELLANEOUS) ×2 IMPLANT
PUNCH AORTIC ROTATE 4.5MM 8IN (MISCELLANEOUS) ×2 IMPLANT
SEALANT SURG COSEAL 8ML (VASCULAR PRODUCTS) ×2 IMPLANT
SET MPS 3-ND DEL (MISCELLANEOUS) IMPLANT
SHEATH AVANTI 11CM 5FR (SHEATH) IMPLANT
SHEATH BRITE TIP 7FRX11 (SHEATH) IMPLANT
SPONGE T-LAP 18X18 ~~LOC~~+RFID (SPONGE) ×8 IMPLANT
SPONGE T-LAP 4X18 ~~LOC~~+RFID (SPONGE) ×2 IMPLANT
SUT ETHIBOND 2 0 SH (SUTURE) ×8
SUT ETHIBOND 2 0 SH 36X2 (SUTURE) ×10 IMPLANT
SUT ETHIBOND NAB MH 2-0 36IN (SUTURE) ×24 IMPLANT
SUT PROLENE 3 0 SH DA (SUTURE) ×4 IMPLANT
SUT PROLENE 4 0 RB 1 (SUTURE) ×18
SUT PROLENE 4 0 SH DA (SUTURE) IMPLANT
SUT PROLENE 4-0 RB1 .5 CRCL 36 (SUTURE) ×8 IMPLANT
SUT PROLENE 5 0 C 1 36 (SUTURE) IMPLANT
SUT PROLENE 5 0 C1 (SUTURE) IMPLANT
SUT SILK 1 MH (SUTURE) ×8 IMPLANT
SUT SILK 1 TIES 10X30 (SUTURE) ×2 IMPLANT
SUT SILK 2 0 SH CR/8 (SUTURE) ×4 IMPLANT
SUT STEEL 6MS V (SUTURE) ×4 IMPLANT
SUT STEEL SZ 6 DBL 3X14 BALL (SUTURE) ×2 IMPLANT
SUT TEM PAC WIRE 2 0 SH (SUTURE) IMPLANT
SUT VIC AB 1 CTX 36 (SUTURE) ×6
SUT VIC AB 1 CTX36XBRD ANBCTR (SUTURE) ×4 IMPLANT
SUT VIC AB 2-0 CTX 27 (SUTURE) ×4 IMPLANT
SUT VIC AB 3-0 SH 8-18 (SUTURE) ×2 IMPLANT
SUT VIC AB 3-0 X1 27 (SUTURE) ×4 IMPLANT
SYR 50ML LL SCALE MARK (SYRINGE) ×2 IMPLANT
SYR BULB IRRIG 60ML STRL (SYRINGE) IMPLANT
SYSTEM SAHARA CHEST DRAIN ATS (WOUND CARE) ×2 IMPLANT
TAPE CLOTH SURG 4X10 WHT LF (GAUZE/BANDAGES/DRESSINGS) IMPLANT
TAPE PAPER 2X10 WHT MICROPORE (GAUZE/BANDAGES/DRESSINGS) IMPLANT
TOWEL GREEN STERILE (TOWEL DISPOSABLE) ×2 IMPLANT
TOWEL GREEN STERILE FF (TOWEL DISPOSABLE) IMPLANT
TRAY CATH LUMEN 1 20CM STRL (SET/KITS/TRAYS/PACK) ×2 IMPLANT
TRAY FOLEY SLVR 16FR TEMP STAT (SET/KITS/TRAYS/PACK) ×2 IMPLANT
TUBE CONNECTING 12X1/4 (SUCTIONS) ×2 IMPLANT
UNDERPAD 30X36 HEAVY ABSORB (UNDERPADS AND DIAPERS) ×2 IMPLANT
VENT LEFT HEART 12002 (CATHETERS)
WATER STERILE IRR 1000ML POUR (IV SOLUTION) ×4 IMPLANT
WIRE AMPLATZ SS-J .035X180CM (WIRE) IMPLANT
YANKAUER SUCT BULB TIP NO VENT (SUCTIONS) ×2 IMPLANT

## 2023-03-08 NOTE — Progress Notes (Addendum)
Advanced Heart Failure VAD Team Note  PCP-Cardiologist: Dr. Gala Romney   Subjective:    9/12: HM3 LVAD Implant. Intra-op TEE LVEF 15%, RV normal   Just returned from OR.   Intubated and sedated   Current gtts: Milrinone 0.125, Epi 2, VP 0.04. NE paused.  iNO @ 30  Swan #s  CVP 12 PAP 26/14 CO 3.83 CI 2.06  PAPi 1.0  SVR 1400  Co-ox 77%   MAP 79   EBL 945 cc. Received 1u PRBC + 4u FFP in OR.   Hgb 8.2  UOP: 75 cc/hr. SCr 1.21   Rhythm stable. No PI events. No low flow alarms.   LVAD INTERROGATION:  HeartMate III LVAD:   Flow 3.6 liters/min, speed 5200, power 3.5, PI 3.5.    Objective:    Vital Signs:   Temp:  [97.5 F (36.4 C)-99.1 F (37.3 C)] 97.7 F (36.5 C) (09/12 1500) Pulse Rate:  [31-79] 32 (09/12 1445) Resp:  [9-27] 18 (09/12 1500) BP: (95-128)/(61-95) 108/67 (09/12 0715) SpO2:  [92 %-99 %] 99 % (09/12 1445) Arterial Line BP: (78-96)/(60-76) 96/76 (09/12 1500) FiO2 (%):  [50 %] 50 % (09/12 1400) Weight:  [70.6 kg] 70.6 kg (09/12 0500) Last BM Date : 03/07/23 Mean arterial Pressure 79   Intake/Output:   Intake/Output Summary (Last 24 hours) at 03/08/2023 1544 Last data filed at 03/08/2023 1500 Gross per 24 hour  Intake 4961.84 ml  Output 3720 ml  Net 1241.84 ml     Physical Exam    General:  intubated and sedated  HEENT: normal + ETT Neck: supple. JVP 12 cm . Carotids 2+ bilat; no bruits. No lymphadenopathy or thyromegaly appreciated. + Rt internal jugular Swan  Cor: Mechanical heart sounds with LVAD hum present. + sternal dressing. + CTs Lungs: Intubated and clear Abdomen: soft, nontender, nondistended. No hepatosplenomegaly. No bruits or masses. Good bowel sounds. Driveline: C/D/I; securement device intact and driveline incorporated Extremities: no cyanosis, clubbing, rash, edema. Warm Neuro: intubated and sedated GU: + Foley   Telemetry   NSR 60s   EKG    No new EKG to review   Labs   Basic Metabolic Panel: Recent Labs   Lab 03/05/23 0451 03/06/23 0334 03/06/23 0936 03/07/23 0317 03/07/23 1500 03/08/23 0408 03/08/23 0823 03/08/23 0917 03/08/23 0942 03/08/23 1012 03/08/23 1022 03/08/23 1104 03/08/23 1156 03/08/23 1159 03/08/23 1253 03/08/23 1336 03/08/23 1357 03/08/23 1435  NA 131* 128*   < > 133* 133* 133*   < > 135   < > 136   < > 136   < > 134* 137 136 139 138  K 4.4 4.4   < > 4.6 4.4 4.3   < > 4.0   < > 4.5   < > 4.4   < > 5.4* 3.9 3.6 3.7 3.8  CL 101 99  --  102 104 104   < > 104  --  100  --  99  --  100  --  103  --   --   CO2 22 21*  --  21* 23 23  --   --   --   --   --   --   --   --   --  25  --   --   GLUCOSE 95 161*  --  121* 147* 123*   < > 137*  --  160*  --  231*  --  219*  --  70  --   --  BUN 37* 38*  --  33* 30* 33*   < > 26*  --  26*  --  26*  --  35*  --  21  --   --   CREATININE 1.61* 1.52*  --  1.45* 1.46* 1.39*   < > 1.20  --  1.10  --  1.30*  --  1.20  --  1.21  --   --   CALCIUM 8.8* 8.7*  --  8.9 8.5* 8.7*  --   --   --   --   --   --   --   --   --  8.3*  --   --   MG 2.2 2.2  --  2.1  --  2.1  --   --   --   --   --   --   --   --   --  1.7  --   --    < > = values in this interval not displayed.    Liver Function Tests: Recent Labs  Lab 03/07/23 1500  AST 35  ALT 30  ALKPHOS 69  BILITOT 0.9  PROT 7.4  ALBUMIN 3.4*   No results for input(s): "LIPASE", "AMYLASE" in the last 168 hours. No results for input(s): "AMMONIA" in the last 168 hours.  CBC: Recent Labs  Lab 03/05/23 0451 03/06/23 0334 03/06/23 0936 03/07/23 0317 03/08/23 0408 03/08/23 0823 03/08/23 1059 03/08/23 1104 03/08/23 1159 03/08/23 1253 03/08/23 1336 03/08/23 1357 03/08/23 1435  WBC 8.5 10.6*  --  10.4 9.9  --   --   --   --   --  14.5*  --   --   HGB 13.4 14.3   < > 12.8* 12.6*   < > 7.7*   < > 9.2* 8.8* 8.1* 8.8* 8.2*  HCT 39.1 42.8   < > 38.6* 38.2*   < > 23.3*   < > 27.0* 26.0* 24.6* 26.0* 24.0*  MCV 93.1 92.4  --  93.5 93.9  --   --   --   --   --  93.9  --   --   PLT  168 204  --  174 161  --  104*  --   --   --  85*  87*  --   --    < > = values in this interval not displayed.    INR: Recent Labs  Lab 03/06/23 0349 03/08/23 0408 03/08/23 1336  INR 1.2 1.3* 1.6*    Other results: EKG:    Imaging   DG Chest Port 1 View  Result Date: 03/08/2023 CLINICAL DATA:  [id number] LVAD (left ventricular assist device) present (HCC) [id number]. EXAM: PORTABLE CHEST 1 VIEW COMPARISON:  03/07/2023. FINDINGS: Interval median sternotomy and placement of a left ventricular assist device. Endotracheal tube tip projects over the midthoracic trachea. Unchanged Left chest AICD/pacemaker with leads projecting over the right atrium and ventricle. Unchanged right IJ approach pulmonary arterial catheter with tip projecting over the right main pulmonary artery. Pleural mediastinal chest tubes are in place. Enteric tube side port projects over the lower esophagus. Interval advancement is recommended. Left basilar atelectasis. No consolidation or pulmonary edema. No pleural effusion or pneumothorax. IMPRESSION: 1. Interval median sternotomy and placement of a left ventricular assist device. 2. Enteric tube side port projects over the lower esophagus. Interval advancement is recommended. Electronically Signed   By: Orvan Falconer M.D.   On: 03/08/2023 15:31   ECHO INTRAOPERATIVE TEE  Result Date: 03/08/2023  *INTRAOPERATIVE TRANSESOPHAGEAL REPORT *  Patient Name:   Steven [name] [facility]   Date of Exam: 03/08/2023 Medical Rec #:  [id number]      Height:       69.0 in Accession #:    [id number]     Weight:       155.6 lb Date of Birth:  1955/12/10      BSA:          1.86 m Patient Age:    67 years       BP:           108/67 mmHg Patient Gender: M              HR:           71 bpm. Exam Location:  Anesthesiology Transesophogeal exam was perform intraoperatively during surgical procedure. Patient was closely monitored under general anesthesia during the entirety of examination. Indications:     CAD; CHF  Performing Phys: Anice Paganini DO Complications: No known complications during this procedure. POST-OP IMPRESSIONS _ Left Ventricle: Left ventricular function remains severely reduced post bypass. LVAD inflow cannula is visualized at LV apex, directed towards the mitral valve. _ Right Ventricle: Right ventricular function is normal on inotropic support. _ Aorta: The aorta appears unchanged from pre-bypass. There is no dissection present in the aorta. _ Left Atrial Appendage: The left atrial appendage appears unchanged from pre-bypass. _ Aortic Valve: The aortic valve appears unchanged from pre-bypass. The valve is not visualized opening with LV contraction following initiation of LVAD. _ Mitral Valve: No regurgitation post repair. _ Tricuspid Valve: There is mild regurgitation. _ Pulmonic Valve: The pulmonic valve appears unchanged from pre-bypass. _ Interatrial Septum: Remains midline on 5200RPMs. _ Interventricular Septum: Remains midline on 5200RPMs. PRE-OP FINDINGS  Left Ventricle: The left ventricle has severely reduced systolic function, with an ejection fraction of 14.6% by Simpsons biplane method of discs. No evidence of LV thrombus. Right Ventricle: The right ventricle has normal systolic function. The cavity was normal. There is no increase in right ventricular wall thickness. FAC 42.8%. TAPSE 1.8cm Left Atrium: No left atrial/left atrial appendage thrombus was detected. Right Atrium: Right atrial size was normal in size. Interatrial Septum: No atrial level shunt detected by color flow Doppler. Agitated saline contrast bubble study was negative, with no evidence of any interatrial shunt. Pericardium: There is no evidence of pericardial effusion. There is no pleural effusion. Mitral Valve: The mitral valve is normal in structure. There is restricted motion of both leaflets with a coaptation point 1.5cm below the annulus. Mitral valve regurgitation is mild by color flow Doppler. The MR jet is centrally-directed  (Carpentier type IIIb). Tricuspid Valve: The tricuspid valve was normal in structure. Tricuspid valve regurgitation is mild by color flow Doppler. Tricuspid annulus measures 4.03cm in diameter. Aortic Valve: The aortic valve is normal in structure. Aortic valve regurgitation was not visualized by color flow Doppler. There is no stenosis of the aortic valve. Pulmonic Valve: The pulmonic valve was normal in structure. Pulmonic valve regurgitation is trivial by color flow Doppler. Aorta: The aortic root is normal in size and structure.  Anice Paganini Electronically signed by Anice Paganini Signature Date/Time: 03/08/2023/3:18:15 PM    Final    EP STUDY  Result Date: 03/08/2023 See surgical note for result.  DG Chest Port 1 View  Result Date: 03/07/2023 CLINICAL DATA:  CHF EXAM: PORTABLE CHEST 1 VIEW COMPARISON:  03/01/2023 chest CT FINDINGS: Much improved aeration with  mild interstitial opacity seen symmetrically. Extensive sheet like pericardial calcification especially along the right heart. Dual-chamber ICD/pacer leads from the left. There is a right IJ sheath with catheter at the right main pulmonary artery. Right PICC with tip at the SVC. Stable cardiomediastinal contours. IMPRESSION: 1. Mild interstitial edema, a significant improvement since 02/26/2023. 2. Extensive pericardial calcification. Electronically Signed   By: Tiburcio Pea M.D.   On: 03/07/2023 11:15     Medications:     Scheduled Medications:  [START ON 03/09/2023] acetaminophen  1,000 mg Oral Q6H   Or   [START ON 03/09/2023] acetaminophen (TYLENOL) oral liquid 160 mg/5 mL  1,000 mg Per Tube Q6H   acetaminophen (TYLENOL) oral liquid 160 mg/5 mL  650 mg Per Tube Once   Or   acetaminophen  650 mg Rectal Once   amiodarone  200 mg Oral Daily   [START ON 03/09/2023] aspirin EC  325 mg Oral Daily   Or   [START ON 03/09/2023] aspirin  324 mg Per Tube Daily   Or   [START ON 03/09/2023] aspirin  300 mg Rectal Daily   atorvastatin  80 mg Oral  Daily   [START ON 03/09/2023] bisacodyl  10 mg Oral Daily   Or   [START ON 03/09/2023] bisacodyl  10 mg Rectal Daily   Chlorhexidine Gluconate Cloth  6 each Topical Daily   [START ON 03/09/2023] docusate sodium  200 mg Oral Daily   feeding supplement  237 mL Oral TID BM   metoCLOPramide (REGLAN) injection  10 mg Intravenous Q6H   multivitamin with minerals  1 tablet Oral Daily   [START ON 03/09/2023] pantoprazole  40 mg Oral Daily   [START ON 03/09/2023] sodium chloride flush  3 mL Intravenous Q12H   tamsulosin  0.4 mg Oral QPC supper    Infusions:  sodium chloride     [START ON 03/09/2023] sodium chloride     sodium chloride Stopped (03/08/23 1418)   albumin human      ceFAZolin (ANCEF) IV     dexmedetomidine (PRECEDEX) IV infusion 0.7 mcg/kg/hr (03/08/23 1500)   epinephrine 2 mcg/min (03/08/23 1500)   famotidine (PEPCID) IV Stopped (03/08/23 1449)   fentaNYL infusion INTRAVENOUS 75 mcg/hr (03/08/23 1524)   [START ON 03/09/2023] fluconazole (DIFLUCAN) IV     insulin Stopped (03/08/23 1403)   lactated ringers     lactated ringers     lactated ringers 20 mL/hr at 03/08/23 1500   magnesium sulfate 20 mL/hr at 03/08/23 1500   milrinone 0.125 mcg/kg/min (03/08/23 1500)   norepinephrine (LEVOPHED) Adult infusion Stopped (03/08/23 1437)   potassium chloride 10 mEq (03/08/23 1518)   [START ON 03/09/2023] rifampin (RIFADIN) 600 mg in sodium chloride 0.9 % 100 mL IVPB     [START ON 03/09/2023] vancomycin     vasopressin 0.04 Units/min (03/08/23 1500)    PRN Medications: sodium chloride, albumin human, dextrose, lactated ringers, midazolam, morphine injection, ondansetron (ZOFRAN) IV, oxyCODONE, [START ON 03/09/2023] sodium chloride flush, traMADol   Patient Profile   Ward P Dengel is a 67 y.o. 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    Admitted with acute on  chronic systolic and diastolic CHF with low-output. Advanced Heart Failure consulted and workup started for advanced therapies. S/p HM3 LVAD 9/12.   Assessment/Plan:    1. 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 CO 3.95. CI 2.15 - end-stage HF w/ low output and inotrope dependent. GDMT limited by renal function and hypotension. - s/p HM3 LVAD implant 9/12 - intra-op TEE LVEF 15%, RV normal  - Post op on Milrinone 0.125 + Epi 2 + VP 0.04. CI 2.06. No low flows on VAD. MAP 79  - wean inotropes/ pressors as tolerated  - vent wean protocol per CT surgery/RT    CAD - h/o large anterior MI 2013 treated w/ DES to LAD  - No CP. HS troponin trend flat, not c/w ACS. - on statin  - no ASA given chronic coumadin - no s/s angina   AKI on CKD IIIa - Scr up to 2 this admission, baseline ~1.2 - cardiorenal. Optimized pre-vad w/ milrinone and NE. Scr improved to 1.30 day of implant  - SCr 1.2 post-op. Good UOP ~75 cc/hr. MAP upper 70s - continue pressor support, keep MAP ~70 for now  - follow SCr and UOP     4. Mural thrombus - On coumadin PTA   H/o VT s/p Medtronic ICD - likely scar mediated - Continue amiodarone 200 mg daily - s/p MDT ICD, last event 8/24 - Keep K>4, Mg >2 - stable  6. Pulmonary  - Intubated for VAD implant  - vent wean protocol per CT surgery/RT    I reviewed the LVAD parameters from today, and compared the results to the patient's prior recorded data.  No programming changes were made.  The LVAD is functioning within specified parameters.  The patient performs LVAD self-test daily.  LVAD interrogation was negative for  any significant power changes, alarms or PI events/speed drops.  LVAD equipment check completed and is in good working order.  Back-up equipment present.   LVAD education done on emergency procedures and precautions and reviewed exit site care.  Length of Stay: [address] Cordelia Poche 03/08/2023, 3:44 PM  VAD Team --- VAD ISSUES ONLY--- Pager [phone] (7am - 7am)  Advanced Heart Failure Team  Pager [phone] (M-F; 7a - 5p)  Please contact [facility] for night-coverage after hours (5p -7a ) and weekends on [email or web address]   Patient seen with PA, agree with the above note.   Subjective:   Exam: General: Intubated/sedated HEENT: Normal.  Neck: Thick neck. No JVD, no thyromegaly or thyroid nodule.  Lungs: Coarse mechanical lung sounds on ventilator CV: Normal LVAD hum.   Abdomen: Soft, nontender, no hepatosplenomegaly, no distention.  Skin: Intact without lesions or rashes.  Neurologic: Intubated/sedated Psych: Intubated/sedated Extremities: No clubbing or cyanosis.   LVAD INTERROGATION:  HeartMate III LVAD:   Flow 3.6 liters/min, speed 5200, power 3.5, PI 3.5.     A/P - Intubated currently on low dose support with epinephrine 2, milrionone , & vasopressin 0.04. LVAD speed at 5200 RPM with flow of 3.6LPM.  - Will continue pressor support for time being. Continue nitric through tonight.  - Low CVP/PA diastolic; will give PRBC & FFP. Albumin if further fluid resuscitation is required.  - Plan for extubation tomorrow. Can increase LVAD speeds tomorrow AM.   CRITICAL CARE Performed by: Dorthula Nettles   Total critical care time:  60 minutes  Critical care time was exclusive of separately billable procedures and treating other patients.  Critical care was necessary to treat or prevent imminent or life-threatening deterioration.  Critical care was time spent personally by me on the following activities: development of treatment plan with patient and/or surrogate as  well as nursing, discussions with consultants, evaluation of patient's response to treatment, examination of patient, obtaining history from patient or surrogate, ordering and performing treatments and interventions, ordering and review of laboratory studies, ordering and review of radiographic studies, pulse oximetry and re-evaluation of patient's condition.

## 2023-03-08 NOTE — Progress Notes (Signed)
Pharmacy Antibiotic Note  Steven [name] is a 67 y.o. male admitted on 02/26/2023 with  acute decompensated HF s/p HMIII implant on 9/12 .  Pharmacy has been consulted for vancomycin dosing. Following operation, patient arrived hemodynamically stable on minimal doses of vasopressors and milrinone 0.125 mcg/kg/min. Patient has remained afebrile throughout this admission and has not been treated for any prior infections at this admission. Patient requires prophylactic antimicrobial treatment with vancomycin and cefazolin.  Plan: Vancomycin 1250 mg IV every 24 hours for a total of 48 hours post-op.  Height: 5\' 9"  (175.3 cm) Weight: 70.6 kg (155 lb 10.3 oz) IBW/kg (Calculated) : 70.7  Temp (24hrs), Avg:98.3 F (36.8 C), Min:97.5 F (36.4 C), Max:99.1 F (37.3 C)  Recent Labs  Lab 03/04/23 0624 03/05/23 0451 03/06/23 0334 03/07/23 0317 03/07/23 1500 03/08/23 0408 03/08/23 0831 03/08/23 0917 03/08/23 1012 03/08/23 1104 03/08/23 1159  WBC 7.7 8.5 10.6* 10.4  --  9.9  --   --   --   --   --   CREATININE 1.70* 1.61* 1.52* 1.45*   < > 1.39* 1.30* 1.20 1.10 1.30* 1.20   < > = values in this interval not displayed.    Estimated Creatinine Clearance: 58.8 mL/min (by C-G formula based on SCr of 1.2 mg/dL).    No Known Allergies  Antimicrobials this admission: Vancomycin 9/12 >>  Cefazolin 9/12 >>   Dose adjustments this admission:   Microbiology results: 9/2 Sputum/Resp panel: negative  9/9 MRSA PCR: negative  Thank you for allowing pharmacy to be a part of this patient's care.  Wilmer Floor 03/08/2023 2:06 PM

## 2023-03-08 NOTE — Anesthesia Procedure Notes (Signed)
Procedure Name: Intubation Date/Time: 03/08/2023 7:55 AM  Performed by: Waynard Edwards, CRNAPre-anesthesia Checklist: Patient identified, Emergency Drugs available, Suction available and Patient being monitored Patient Re-evaluated:Patient Re-evaluated prior to induction Oxygen Delivery Method: Circle System Utilized Preoxygenation: Pre-oxygenation with 100% oxygen Induction Type: IV induction Ventilation: Mask ventilation without difficulty Laryngoscope Size: Mac and 4 Grade View: Grade I Tube type: Oral Tube size: 8.0 mm Number of attempts: 1 Airway Equipment and Method: Stylet and Oral airway Placement Confirmation: ETT inserted through vocal cords under direct vision, positive ETCO2 and breath sounds checked- equal and bilateral Secured at: 23 cm Tube secured with: Tape Dental Injury: Teeth and Oropharynx as per pre-operative assessment

## 2023-03-08 NOTE — Transfer of Care (Signed)
Immediate Anesthesia Transfer of Care Note  Patient: Steven [name]  Procedure(s) Performed: INSERTION OF IMPLANTABLE LEFT VENTRICULAR ASSIST DEVICE (Chest) TRANSESOPHAGEAL ECHOCARDIOGRAM  Patient Location: ICU  Anesthesia Type:General  Level of Consciousness: sedated and Patient remains intubated per anesthesia plan  Airway & Oxygen Therapy: Patient remains intubated per anesthesia plan and Patient placed on Ventilator (see vital sign flow sheet for setting)  Post-op Assessment: Report given to RN and Post -op Vital signs reviewed and stable  Post vital signs: Reviewed and stable  Last Vitals:  Vitals Value Taken Time  BP MAP of 73 03/08/23  1346  Temp    Pulse 30 03/08/23 1346  Resp 20 03/08/23 1346  SpO2 96 % 03/08/23 1346  Vitals shown include unfiled device data.  Last Pain:  Vitals:   03/08/23 0400  TempSrc: Core  PainSc: Asleep      Patients Stated Pain Goal: 0 (03/05/23 1800)  Complications: No notable events documented.

## 2023-03-08 NOTE — Progress Notes (Signed)
Outpatient Heart Failure CSW met with pt at bedside to discuss change in caregiver.  Patient would now like his ex-partner/mother of his child to be his caregiver.  CSW able to meet with patient and ex-partner, Jon Gills, along with LVAD coordinator at bedside to discuss caregiving expectations and responsibilities.    Caregiver questions Please explain what you hope will be improved about your life and loved one's life as a result of receiving the LVAD? Hopes that it will allow him to get back to normal and be able to work again. What is your biggest concern or fear about caregiving with an LVAD patient? Only worry would be unexpected death of the patient.  She had lost her father not to long ago at this hospital so having the patient here makes her anxious that his situation will be similar.   What is your plan for availability to provide care 24/7 x2 weeks post op and dressing changes ongoing?Does not work so will plan to move in with the patient to be with them 24/7. Who is the relief/backup caregiver and what is their availability? Patient would no like a relief caregiver at this time.  Preferred method of learning? Hands on   Do you drive? yes How do you handle stressful situations? Sees a therapist to process things, listens to music, takes a step back to get herself in the right mindset. Do you think you can do this? yes Is there anything that concerns about caregiving? no  Do you provide caregiving to anyone else? no     Caregiver's current level of motivation to prepare for LVAD: 10/10 Caregiver's present level of consent for LVAD: 10/10  Burna Sis, LCSW Clinical Social Worker [facility] Desk#: [phone] Cell#: [phone]

## 2023-03-08 NOTE — Progress Notes (Signed)
Medtronic rep paged at this time to turn off ICD. No previous notes indicating request prior to surgery.

## 2023-03-08 NOTE — Anesthesia Procedure Notes (Signed)
Arterial Line Insertion Start/End9/05/2023 7:05 AM, 03/08/2023 7:08 AM Performed by: CRNA  Patient location: Pre-op. Preanesthetic checklist: patient identified, IV checked, site marked, risks and benefits discussed, surgical consent, monitors and equipment checked, pre-op evaluation, timeout performed and anesthesia consent Lidocaine 1% used for infiltration Left, radial was placed Catheter size: 20 G Hand hygiene performed , maximum sterile barriers used  and Seldinger technique used  Attempts: 1 Procedure performed without using ultrasound guided technique. Following insertion, dressing applied and Biopatch. Post procedure assessment: normal and unchanged  Patient tolerated the procedure well with no immediate complications. Additional procedure comments: Placed by Kennedy Bucker, SRNA.

## 2023-03-08 NOTE — Anesthesia Postprocedure Evaluation (Signed)
Anesthesia Post Note  Patient: Steven [name]  Procedure(s) Performed: INSERTION OF IMPLANTABLE LEFT VENTRICULAR ASSIST DEVICE (Chest) TRANSESOPHAGEAL ECHOCARDIOGRAM     Patient location during evaluation: SICU Anesthesia Type: General Level of consciousness: sedated Pain management: pain level controlled Vital Signs Assessment: post-procedure vital signs reviewed and stable Respiratory status: patient remains intubated per anesthesia plan Cardiovascular status: blood pressure returned to baseline and stable Postop Assessment: no apparent nausea or vomiting Anesthetic complications: no   No notable events documented.  Last Vitals:  Vitals:   03/08/23 0720 03/08/23 1344  BP:    Pulse: 67 73  Resp: (!) 21 19  Temp:    SpO2: 98% 96%    Last Pain:  Vitals:   03/08/23 0400  TempSrc: Core  PainSc: Asleep                 Laughlin Nation

## 2023-03-08 NOTE — Progress Notes (Signed)
Per patient request, patient's cell phone, wallet, and money clip transported to security in secure envelope by this RN. Yellow paper copy placed in patient shadow box.   Persephanie Laatsch Berneta Levins, RN

## 2023-03-08 NOTE — Op Note (Signed)
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   Implantation of HeartMate 3 Left Ventricular Assist Device.   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  Ejection fraction is less than 20% by echocardiogram and he was admitted with NYHA class IV symptoms.  He has stabilized on milrinone 0.125 and norepinephrine at 2 with right heart cath today showing low filling pressures. Co-ox is 62%.  The only option available for this patient is left ventricular assist device as destination therapy.  His operative risk is increased due to several factors.  His CT of the chest shows extensive calcification of his pericardium particularly over the right atrium and atrioventricular groove and part of the right ventricle.  His pericardial space is likely obliterated due to remote pericarditis.  With the degree of calcification it may take quite a while on pump to be able to free his heart.  There is also a risk of cardiac injury if this calcification extends down into the visceral pericardium which is the usual situation.  His ascending aorta is calcified over the posterior one half which may complicate partial occlusion of the aorta for sewing on the outflow graft.  It will certainly increase his risk of stroke.  He has stage III chronic kidney disease which may worsen with a prolonged pump run.  I discussed all this with him and he is in agreement to proceed.    The operative procedure has been discussed with the patient and  family including alternatives, benefits and risks; including but not limited to bleeding, blood transfusion, infection, stroke, myocardial infarction, pump failure or thrombus possibly requiring replacement, RV dysfunction requiring an RVAD or long term milrinone therapy, heart block requiring a permanent pacemaker, organ dysfunction, and death.  The patient understands and agrees to proceed.      Preparation:  The patient was seen in the preoperative holding area and the correct patient, correct operation were confirmed with the patient after reviewing the medical record and catheterization. The consent was signed by me. Preoperative antibiotics were given. A pulmonary arterial line and radial arterial line were placed by the anesthesia team. The patient was taken back to the operating room and positioned supine on the operating room table. After being placed under general endotracheal anesthesia by the anesthesia team a foley catheter was placed. The neck, chest, abdomen, and both legs were prepped with betadine soap and solution and draped in the usual sterile manner. A surgical time-out was taken and the correct patient and operative procedure were confirmed with the nursing and anesthesia staff. Then a 7 Fr sheath was inserted into the right common femoral vein using ultrasound guidance.   Pre-bypass TEE:   *INTRAOPERATIVE TRANSESOPHAGEAL REPORT *      Patient Name:   Steven [name] [facility]   Date of Exam: 03/08/2023  Medical Rec #:  [id number]      Height:       69.0 in  Accession #:    [id number]     Weight:       155.6 lb  Date  of Birth:  [birth year]      BSA:          1.86 m  Patient Age:    67 years       BP:           108/67 mmHg  Patient Gender: M              HR:           71 bpm.  Exam Location:  Anesthesiology   Transesophogeal exam was perform intraoperatively during surgical  procedure.  Patient was closely monitored under general anesthesia during the entirety  of  examination.    Indications:    CAD; CHF  Performing Phys: Anice Paganini DO   Complications: No known complications during this procedure.  POST-OP IMPRESSIONS  _ Left Ventricle: Left ventricular function remains severely reduced post  bypass. LVAD inflow cannula is visualized at LV apex, directed towards the  mitral valve.  _ Right Ventricle: Right ventricular function is normal on inotropic  support.  _ Aorta: The aorta appears unchanged from pre-bypass. There is no  dissection  present in the aorta.  _ Left Atrial Appendage: The left atrial appendage appears unchanged from  pre-bypass.  _ Aortic Valve: The aortic valve appears unchanged from pre-bypass. The  valve is  not visualized opening with LV contraction following initiation of LVAD.  _ Mitral Valve: No regurgitation post repair.  _ Tricuspid Valve: There is mild regurgitation.  _ Pulmonic Valve: The pulmonic valve appears unchanged from pre-bypass.  _ Interatrial Septum: Remains midline on 5200RPMs.  _ Interventricular Septum: Remains midline on 5200RPMs.   PRE-OP FINDINGS   Left Ventricle: The left ventricle has severely reduced systolic  function, with an ejection fraction of 14.6% by Simpsons biplane method of  discs. No evidence of LV thrombus.    Right Ventricle: The right ventricle has normal systolic function. The  cavity was normal. There is no increase in right ventricular wall  thickness. FAC 42.8%. TAPSE 1.8cm    Left Atrium: No left atrial/left atrial appendage thrombus was detected.   Right Atrium: Right atrial size was normal in size.   Interatrial Septum: No atrial level shunt detected by color flow Doppler.  Agitated saline contrast bubble study was negative, with no evidence of  any interatrial shunt.   Pericardium: There is no evidence of pericardial effusion. There is no  pleural effusion.   Mitral Valve: The mitral valve is normal in structure. There is restricted  motion of both leaflets with a coaptation  point 1.5cm below the annulus.  Mitral valve regurgitation is mild by color flow Doppler. The MR jet is  centrally-directed (Carpentier  type IIIb).   Tricuspid Valve: The tricuspid valve was normal in structure. Tricuspid  valve regurgitation is mild by color flow Doppler. Tricuspid annulus  measures 4.03cm in diameter.   Aortic Valve: The aortic valve is normal in structure. Aortic valve  regurgitation was not visualized by color flow Doppler. There is no  stenosis of the aortic valve.    Pulmonic Valve: The pulmonic valve was normal in structure.  Pulmonic valve regurgitation is trivial by color flow Doppler.    Aorta: The aortic root is normal in size and structure.     Anice Paganini  Electronically signed by Anice Paganini  Signature Date/Time: 03/08/2023/3:18:15 PM        Final     Median sternotomy:    A median sternotomy was performed. The pericardium was opened over the  aorta where it was soft. The anterior pericardium over the RV and laterally over the right atrium was calcified and thickened and adherent to the heart.  The ascending aorta was generous and had some calcified plaque posteriorly only.  There were no contraindications to aortic cannulation or cross-clamping. Then a 4 cm transverse incision was made to the left of the umbilicus and continued down to the rectus fascia using electrocautery. A skin punch was used to create the drive line exit site about 3 cm below the right costal margin in the anterior axillary line. The straight tunneling device was passed in a subcutaneous plane from the transverse abdominal incision to the pericardium. The curved tunneling device was passed from the abdominal incision the the skin exit site.   Cardiopulmonary Bypass:   The patient was fully systemically heparinized and the ACT was maintained > 400 sec. The proximal aortic arch was cannulated with a 20 F aortic cannula for arterial inflow. Venous cannulation was performed via the  right common femoral vein. An Amplatz superstiff wire was inserted through the right femoral sheath and advanced in to the SVC under TEE guidance. The sheath was removed and the femoral vein progressively dilated. A 25 Fr long multi-stage venous cannula was inserted over the guidewire and the tip confirmed in the right atrium by TEE. It was connected to the venous end of the bypass circuit. Carbon dioxide was insufflated into the pericardium at 6L/min throughout the procedure to minimize intracardiac air. The systemic temperature was allowed to drift downward slightly during the procedure and the patient was actively rewarmed.The patient was placed on CP bypass.   The calcified pericardium over the right ventricle was carefully separated from the heart using electrocautery.  This was completely resected over the anterior surface of the right ventricle.  The calcified pericardium over the right atrium was not disturbed due to the risk of injury to the right atrium.  There was also calcified pericardium extending along the inferior diaphragmatic surface and this was resected.  The pericardium was opened along the left diaphragm out to the apex. The left diaphragm was taken down from the anterior chest wall over a short distance medially using electrocautery.   Insertion of apical outflow cannula:  Laparotomy pads were placed behind the heart to aid exposure of the apex. A site was chosen on the anterior wall lateral to the LAD and superior to the true apex. A stab incision was made and a foley catheter placed through the coring knife and into the left ventricle. The balloon was inflated and with gentle traction on the catheter the coring knife was carefully advanced toward the mitral valve to create the ventriculotomy. The balloon was deflated and removed. A sump was placed into the LV and the ventricular core excised and sent to pathology. Trabeculations that might cause obstruction were excised. Then a  series of 2-0 Ethibond horizontal mattress sutures were placed around the ventriculotomy. This took 12 sutures. The sutures were placed through the apical cuff. The sutures were tied and the apical cuff appeared to be well-sealed to the apex. Then volume was left in the heart and the lungs inflated. The HeartMate 3 was brought onto the operative field. The inflow cannula was inserted into the apical cuff and the apical cuff lock engaged. The drive line was then tunneled using the previously placed tunneling devices. The outflow graft and bend relief were checked and were firmly attached and not twisted.  The black line on the graft  was oriented along the margin of the pericardium to prevent twisting of the graft. The pump was turned into the appropriate position. The heart and pump were returned to the pericardium and appeared to sit nicely.   Aortic outflow graft anastomosis:  The outflow graft was distended and positioned along the inferior margin of the heart and around the right atrium. The outflow graft was cut to the appropriate length. The ascending aorta was partially occluded with a Lemole clamp. A slightly diagonall midline aortotomy was created and the ends enlarged with a 4.5 mm punch. The outflow graft was anastomosed to the aorta using 4-0 prolene pledgetted sutures at the toe and heal that were run down each side. Coseal was used to seal the needle holes in the graft. The Lemole clamp was removed and the graft clamped with a peripheral Debakey clamp and deaired using a 21 gauge needle. The aortic anastomosis was hemostatic.  The right pleural space was open so that the outflow graft would lie lateral to the right atrium into the right pleural space since the calcified pericardium could not be removed from the right atrium.   Weaning from bypass:  The patient was started on Milrinone 0.125 mcg/kg/min, epinephrine 2 mcg/kg/min, levophed at 4 mcg/kg/min, vasopressin 0.03 and nitric oxide at 20  ppm. The HeartMate 3 pump was started at 3400 rpm. TEE confirmed that the intracardiac air was removed.  Cardiopulmonary bypass flow was decreased to half flow and the clamp removed from the outflow graft. The speed was gradually increased to 4000 rpm. Flow was decreased to 1/4 and the speed gradually increased to 4800 rpm. Cardiopulmonary bypass was discontinued and the speed increased to 5200 rpm.  Pulmonary artery pressures were normal with a normal CVP. CO was 4 L/min. CPB time was 125 minutes. TEE showed excellent apical cannula position.  Completion:  We did not place epicardial pacing wire since the patient had an internal pacemaker and a heart rate in the 70s and 80s that was consistent.  Heparin was fully reversed with protamine and the aortic and venous cannulas removed. Hemostasis was achieved but the patient was coagulopathic and received FFP, platelets, and cryoprecipitate. Two mediastinal drainage tubes bilateral pleural chest tubes, and a 19 [place] Blake pocket drain were placed.  The sternum was closed with  #6 stainless steel wires. The fascia was closed with continuous # 1 vicryl suture. The subcutaneous tissue was closed with 2-0 vicryl continuous suture. The skin was closed with 3-0 vicryl subcuticular suture. The abdominal incision was closed with continuous 3-0 vicryl subcutaneous suture and 3-0 vicryl subcuticular skin suture. The drive line exit site was re-approximated  3-0 nylon skin sutures. The two drive line fasteners were applied. All sponge, needle, and instrument counts were reported correct at the end of the case. Dry sterile dressings were placed over the incisions and around the chest tubes which were connected to pleurevac suction. The patient was then transported to the surgical intensive care unit in stable condition.

## 2023-03-08 NOTE — Interval H&P Note (Signed)
History and Physical Interval Note:  03/08/2023 6:31 AM  Steven [name]  has presented today for surgery, with the diagnosis of ICM.  The various methods of treatment have been discussed with the patient and family. After consideration of risks, benefits and other options for treatment, the patient has consented to  Procedure(s): INSERTION OF IMPLANTABLE LEFT VENTRICULAR ASSIST DEVICE (N/A) TRANSESOPHAGEAL ECHOCARDIOGRAM (N/A) as a surgical intervention.  The patient's history has been reviewed, patient examined, no change in status, stable for surgery.  I have reviewed the patient's chart and labs.  Questions were answered to the patient's satisfaction.     Alleen Borne

## 2023-03-08 NOTE — Consult Note (Signed)
ANTICOAGULATION CONSULT NOTE - Follow-up  Pharmacy Consult for heparin Indication: apical mural thrombus  No Known Allergies  Patient Measurements: Height: 5\' 9"  (175.3 cm) Weight: 70.6 kg (155 lb 10.3 oz) IBW/kg (Calculated) : 70.7 Heparin Dosing Weight: ~72 kg  Vital Signs: Temp: 97.7 F (36.5 C) (09/12 0630) Temp Source: Core (09/12 0400) BP: 108/67 (09/12 0715) Pulse Rate: 67 (09/12 0720)  Labs: Recent Labs    03/06/23 0334 03/06/23 0349 03/06/23 0936 03/07/23 0317 03/07/23 1500 03/08/23 0408 03/08/23 0823 03/08/23 1012 03/08/23 1022 03/08/23 1059 03/08/23 1104 03/08/23 1156 03/08/23 1159 03/08/23 1253  HGB 14.3  --    < > 12.8*  --  12.6*   < > 9.2*   < > 7.7* 8.8* 8.8* 9.2* 8.8*  HCT 42.8  --    < > 38.6*  --  38.2*   < > 27.0*   < > 23.3* 26.0* 26.0* 27.0* 26.0*  PLT 204  --   --  174  --  161  --   --   --  104*  --   --   --   --   APTT  --   --   --   --   --  103*  --   --   --   --   --   --   --   --   LABPROT  --  15.3*  --   --   --  16.3*  --   --   --   --   --   --   --   --   INR  --  1.2  --   --   --  1.3*  --   --   --   --   --   --   --   --   HEPARINUNFRC 0.52  --   --  0.39  --  0.44  --   --   --   --   --   --   --   --   CREATININE 1.52*  --   --  1.45*   < > 1.39*   < > 1.10  --   --  1.30*  --  1.20  --    < > = values in this interval not displayed.    Estimated Creatinine Clearance: 58.8 mL/min (by C-G formula based on SCr of 1.2 mg/dL).  Medical History: Past Medical History:  Diagnosis Date   Acute MI, anterior wall (HCC)    AICD (automatic cardioverter/defibrillator) present    CAD (coronary artery disease)    2D ECHO, 07/13/2011 - EF <25%, LV moderatelty dilated, LA moderately dilatedLEXISCAN, 12/14/2011 - moderate-severe perfusion defect seen in the basal anteroseptal, mid anterior, apicacl anterior, apical, apical inferior, and apical lateral regions, post-stress EF 25%, new EKG changes from baseline abnormalities   Cancer  [facility])    Prostate   CHF (congestive heart failure) (HCC) 2012   Hypertension 08/08/2021   Inguinal hernia, left    Pneumonia    November 2023   Pre-diabetes     Assessment: 67 yo male with history of HFrEF, CAD, VT, 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  Warfarin started on admission and held for procedures (last dose 9/2).  Pharmacy consulted to dose heparin.  Warfarin therapy held at this admission with anticipation of future procedures. Patient will continue on heparin infusion for now. Heparin level is  therapeutic at 0.44, on 1200 units/hr. Hgb 12.6, plt 161. No s/sx of bleeding or infusion issues.   Heparin level is therapeutic at 0.44 on 1200 units/hour at 03/08/23 04:08. Okay to continue same rate. Patient has no overt signs/symptoms of bleeding.  Patient was scheduled for HM3 implant this AM.  Goal of Therapy:  INR 2-3 Heparin level 0.3-0.7 units/ml Monitor platelets by anticoagulation protocol: Yes   Plan:  Continue heparin gtt at 1200 units/hr before procedure today. Follow-up after procedure for [facility] plans. Daily heparin level, CBC, s/sx bleeding  Thank you for allowing pharmacy to participate in this patient's care,  Wilmer Floor, PharmD PGY2 Cardiology Pharmacy Resident 03/08/2023 1:12 PM  Please check AMION for all [facility] phone numbers After 10:00 PM, call Main Pharmacy [phone]

## 2023-03-08 NOTE — Brief Op Note (Signed)
02/26/2023 - 03/08/2023  12:09 PM  PATIENT:  Steven [name]  68 y.o. male  PRE-OPERATIVE DIAGNOSIS:  ICM  POST-OPERATIVE DIAGNOSIS:  ICM  PROCEDURE:  Procedure(s): INSERTION OF IMPLANTABLE LEFT VENTRICULAR ASSIST DEVICE (N/A) TRANSESOPHAGEAL ECHOCARDIOGRAM (N/A) Heartmate3   SURGEON:  Surgeons and Role:    * Bartle, Payton Doughty, MD - Primary    * Lovett Sox, MD - Assisting  PHYSICIAN ASSISTANT: None  ASSISTANTS: RNFA   ANESTHESIA:   general  EBL: 350 cc  BLOOD ADMINISTERED: 1u PRBC   4u FFP  DRAINS: R Pleural, 2 MT, 1VAD Pocket drain   LOCAL MEDICATIONS USED:  NONE  SPECIMEN:  Excision LV apex myocardium Pericardial plaque  DISPOSITION OF SPECIMEN:  PATHOLOGY  COUNTS:  YES  TOURNIQUET:  * No tourniquets in log *  DICTATION: .Dragon Dictation  PLAN OF CARE:  return to [facility] ICU  PATIENT DISPOSITION:  ICU - intubated and hemodynamically stable.   Delay start of Pharmacological VTE agent (>24hrs) due to surgical blood loss or risk of bleeding: yes

## 2023-03-08 NOTE — Progress Notes (Signed)
Outpatient Heart Failure CSW following patient for new LVAD implant.  Touched base with patient caregiver, Suzette Battiest, to see how she is doing today and offer support as needed- reports no needs at this time.  CSW will continue to follow during hospital stay and assist patient and caregiver as needed.   Burna Sis, LCSW Clinical Social Worker [facility] Desk#: [phone] Cell#: [phone]

## 2023-03-09 ENCOUNTER — Encounter (HOSPITAL_COMMUNITY): Payer: Self-pay | Admitting: Surgery

## 2023-03-09 ENCOUNTER — Inpatient Hospital Stay (HOSPITAL_COMMUNITY): Payer: 59

## 2023-03-09 DIAGNOSIS — I5043 Acute on chronic combined systolic (congestive) and diastolic (congestive) heart failure: Secondary | ICD-10-CM | POA: Diagnosis not present

## 2023-03-09 DIAGNOSIS — Z95811 Presence of heart assist device: Secondary | ICD-10-CM

## 2023-03-09 LAB — POCT I-STAT 7, (LYTES, BLD GAS, ICA,H+H)
Acid-base deficit: 2 mmol/L (ref 0.0–2.0)
Acid-base deficit: 3 mmol/L — ABNORMAL HIGH (ref 0.0–2.0)
Acid-base deficit: 3 mmol/L — ABNORMAL HIGH (ref 0.0–2.0)
Acid-base deficit: 6 mmol/L — ABNORMAL HIGH (ref 0.0–2.0)
Bicarbonate: 22.9 mmol/L (ref 20.0–28.0)
Bicarbonate: 22.8 mmol/L (ref 20.0–28.0)
Bicarbonate: 21.6 mmol/L (ref 20.0–28.0)
Bicarbonate: 17.8 mmol/L — ABNORMAL LOW (ref 20.0–28.0)
Calcium, Ion: 1.16 mmol/L (ref 1.15–1.40)
Calcium, Ion: 1.17 mmol/L (ref 1.15–1.40)
Calcium, Ion: 1.15 mmol/L (ref 1.15–1.40)
Calcium, Ion: 1.14 mmol/L — ABNORMAL LOW (ref 1.15–1.40)
HCT: 28 % — ABNORMAL LOW (ref 39.0–52.0)
HCT: 26 % — ABNORMAL LOW (ref 39.0–52.0)
HCT: 25 % — ABNORMAL LOW (ref 39.0–52.0)
HCT: 27 % — ABNORMAL LOW (ref 39.0–52.0)
Hemoglobin: 9.5 g/dL — ABNORMAL LOW (ref 13.0–17.0)
Hemoglobin: 8.8 g/dL — ABNORMAL LOW (ref 13.0–17.0)
Hemoglobin: 8.5 g/dL — ABNORMAL LOW (ref 13.0–17.0)
Hemoglobin: 9.2 g/dL — ABNORMAL LOW (ref 13.0–17.0)
O2 Saturation: 99 %
O2 Saturation: 99 %
O2 Saturation: 96 %
O2 Saturation: 92 %
Patient temperature: 36.8
Patient temperature: 36.3
Patient temperature: 36.5
Patient temperature: 37.9
Potassium: 4.9 mmol/L (ref 3.5–5.1)
Potassium: 4.5 mmol/L (ref 3.5–5.1)
Potassium: 4.3 mmol/L (ref 3.5–5.1)
Potassium: 4.3 mmol/L (ref 3.5–5.1)
Sodium: 139 mmol/L (ref 135–145)
Sodium: 137 mmol/L (ref 135–145)
Sodium: 137 mmol/L (ref 135–145)
Sodium: 134 mmol/L — ABNORMAL LOW (ref 135–145)
TCO2: 24 mmol/L (ref 22–32)
TCO2: 24 mmol/L (ref 22–32)
TCO2: 23 mmol/L (ref 22–32)
TCO2: 19 mmol/L — ABNORMAL LOW (ref 22–32)
pCO2 arterial: 36.7 mmHg (ref 32–48)
pCO2 arterial: 41.2 mmHg (ref 32–48)
pCO2 arterial: 37.4 mmHg (ref 32–48)
pCO2 arterial: 30.5 mmHg — ABNORMAL LOW (ref 32–48)
pH, Arterial: 7.402 (ref 7.35–7.45)
pH, Arterial: 7.349 — ABNORMAL LOW (ref 7.35–7.45)
pH, Arterial: 7.368 (ref 7.35–7.45)
pH, Arterial: 7.377 (ref 7.35–7.45)
pO2, Arterial: 138 mmHg — ABNORMAL HIGH (ref 83–108)
pO2, Arterial: 119 mmHg — ABNORMAL HIGH (ref 83–108)
pO2, Arterial: 81 mmHg — ABNORMAL LOW (ref 83–108)
pO2, Arterial: 66 mmHg — ABNORMAL LOW (ref 83–108)

## 2023-03-09 LAB — PREPARE CRYOPRECIPITATE
Unit division: 0
Unit division: 0

## 2023-03-09 LAB — MAGNESIUM
Magnesium: 2.4 mg/dL (ref 1.7–2.4)
Magnesium: 2.4 mg/dL (ref 1.7–2.4)

## 2023-03-09 LAB — GLUCOSE, CAPILLARY
Glucose-Capillary: 101 mg/dL — ABNORMAL HIGH (ref 70–99)
Glucose-Capillary: 123 mg/dL — ABNORMAL HIGH (ref 70–99)
Glucose-Capillary: 132 mg/dL — ABNORMAL HIGH (ref 70–99)
Glucose-Capillary: 135 mg/dL — ABNORMAL HIGH (ref 70–99)
Glucose-Capillary: 146 mg/dL — ABNORMAL HIGH (ref 70–99)
Glucose-Capillary: 151 mg/dL — ABNORMAL HIGH (ref 70–99)
Glucose-Capillary: 162 mg/dL — ABNORMAL HIGH (ref 70–99)
Glucose-Capillary: 169 mg/dL — ABNORMAL HIGH (ref 70–99)
Glucose-Capillary: 82 mg/dL (ref 70–99)
Glucose-Capillary: 93 mg/dL (ref 70–99)
Glucose-Capillary: 93 mg/dL (ref 70–99)
Glucose-Capillary: 96 mg/dL (ref 70–99)
Glucose-Capillary: 96 mg/dL (ref 70–99)
Glucose-Capillary: 96 mg/dL (ref 70–99)
Glucose-Capillary: 98 mg/dL (ref 70–99)
Glucose-Capillary: 99 mg/dL (ref 70–99)

## 2023-03-09 LAB — CBC WITH DIFFERENTIAL/PLATELET
Abs Immature Granulocytes: 0.05 10*3/uL (ref 0.00–0.07)
Basophils Absolute: 0.1 10*3/uL (ref 0.0–0.1)
Basophils Relative: 0 %
Eosinophils Absolute: 1.2 10*3/uL — ABNORMAL HIGH (ref 0.0–0.5)
Eosinophils Relative: 10 %
HCT: 27.3 % — ABNORMAL LOW (ref 39.0–52.0)
Hemoglobin: 9.4 g/dL — ABNORMAL LOW (ref 13.0–17.0)
Immature Granulocytes: 0 %
Lymphocytes Relative: 6 %
Lymphs Abs: 0.7 10*3/uL (ref 0.7–4.0)
MCH: 31.6 pg (ref 26.0–34.0)
MCHC: 34.4 g/dL (ref 30.0–36.0)
MCV: 91.9 fL (ref 80.0–100.0)
Monocytes Absolute: 1 10*3/uL (ref 0.1–1.0)
Monocytes Relative: 8 %
Neutro Abs: 9.1 10*3/uL — ABNORMAL HIGH (ref 1.7–7.7)
Neutrophils Relative %: 76 %
Platelets: 106 10*3/uL — ABNORMAL LOW (ref 150–400)
RBC: 2.97 MIL/uL — ABNORMAL LOW (ref 4.22–5.81)
RDW: 15.1 % (ref 11.5–15.5)
WBC: 12.1 10*3/uL — ABNORMAL HIGH (ref 4.0–10.5)
nRBC: 0 % (ref 0.0–0.2)

## 2023-03-09 LAB — BPAM FFP
Blood Product Expiration Date: 202409142359
Blood Product Expiration Date: 202409142359
Blood Product Expiration Date: 202409142359
Blood Product Expiration Date: 202409142359
Blood Product Expiration Date: 202409142359
Blood Product Expiration Date: 202409142359
ISSUE DATE / TIME: 202409121017
ISSUE DATE / TIME: 202409121017
ISSUE DATE / TIME: 202409121017
ISSUE DATE / TIME: 202409121017
ISSUE DATE / TIME: 202409121738
ISSUE DATE / TIME: 202409121738
Unit Type and Rh: 2800
Unit Type and Rh: 8400
Unit Type and Rh: 8400
Unit Type and Rh: 8400
Unit Type and Rh: 8400
Unit Type and Rh: 8400

## 2023-03-09 LAB — BASIC METABOLIC PANEL
Anion gap: 12 (ref 5–15)
BUN: 19 mg/dL (ref 8–23)
CO2: 19 mmol/L — ABNORMAL LOW (ref 22–32)
Calcium: 8.4 mg/dL — ABNORMAL LOW (ref 8.9–10.3)
Chloride: 103 mmol/L (ref 98–111)
Creatinine, Ser: 1.43 mg/dL — ABNORMAL HIGH (ref 0.61–1.24)
GFR, Estimated: 53 mL/min — ABNORMAL LOW (ref >60)
Glucose, Bld: 124 mg/dL — ABNORMAL HIGH (ref 70–99)
Potassium: 4.3 mmol/L (ref 3.5–5.1)
Sodium: 134 mmol/L — ABNORMAL LOW (ref 135–145)

## 2023-03-09 LAB — BPAM CRYOPRECIPITATE
Blood Product Expiration Date: 202409162359
Blood Product Expiration Date: 202409172359
ISSUE DATE / TIME: 202409121222
ISSUE DATE / TIME: 202409121258
Unit Type and Rh: 5100
Unit Type and Rh: 6200

## 2023-03-09 LAB — CBC
HCT: 27.1 % — ABNORMAL LOW (ref 39.0–52.0)
Hemoglobin: 9.1 g/dL — ABNORMAL LOW (ref 13.0–17.0)
MCH: 30.5 pg (ref 26.0–34.0)
MCHC: 33.6 g/dL (ref 30.0–36.0)
MCV: 90.9 fL (ref 80.0–100.0)
Platelets: 115 K/uL — ABNORMAL LOW (ref 150–400)
RBC: 2.98 MIL/uL — ABNORMAL LOW (ref 4.22–5.81)
RDW: 16 % — ABNORMAL HIGH (ref 11.5–15.5)
WBC: 14.3 K/uL — ABNORMAL HIGH (ref 4.0–10.5)
nRBC: 0 % (ref 0.0–0.2)

## 2023-03-09 LAB — PREPARE FRESH FROZEN PLASMA
Unit division: 0
Unit division: 0
Unit division: 0
Unit division: 0
Unit division: 0

## 2023-03-09 LAB — COMPREHENSIVE METABOLIC PANEL
ALT: 18 U/L (ref 0–44)
AST: 54 U/L — ABNORMAL HIGH (ref 15–41)
Albumin: 3.3 g/dL — ABNORMAL LOW (ref 3.5–5.0)
Alkaline Phosphatase: 37 U/L — ABNORMAL LOW (ref 38–126)
Anion gap: 13 (ref 5–15)
BUN: 20 mg/dL (ref 8–23)
CO2: 22 mmol/L (ref 22–32)
Calcium: 8.4 mg/dL — ABNORMAL LOW (ref 8.9–10.3)
Chloride: 103 mmol/L (ref 98–111)
Creatinine, Ser: 1.28 mg/dL — ABNORMAL HIGH (ref 0.61–1.24)
GFR, Estimated: >60 mL/min (ref >60)
Glucose, Bld: 92 mg/dL (ref 70–99)
Potassium: 4.8 mmol/L (ref 3.5–5.1)
Sodium: 138 mmol/L (ref 135–145)
Total Bilirubin: 4.2 mg/dL — ABNORMAL HIGH (ref 0.3–1.2)
Total Protein: 5.5 g/dL — ABNORMAL LOW (ref 6.5–8.1)

## 2023-03-09 LAB — QUANTIFERON-TB GOLD PLUS (RQFGPL)
QuantiFERON Mitogen Value: 2.51 [IU]/mL
QuantiFERON Nil Value: 0 [IU]/mL
QuantiFERON TB1 Ag Value: 0 [IU]/mL
QuantiFERON TB2 Ag Value: 0 [IU]/mL

## 2023-03-09 LAB — PROTIME-INR
INR: 1.6 — ABNORMAL HIGH (ref 0.8–1.2)
Prothrombin Time: 18.9 s — ABNORMAL HIGH (ref 11.4–15.2)

## 2023-03-09 LAB — LACTATE DEHYDROGENASE: LDH: 234 U/L — ABNORMAL HIGH (ref 98–192)

## 2023-03-09 LAB — COOXEMETRY PANEL
Carboxyhemoglobin: 2.3 % — ABNORMAL HIGH (ref 0.5–1.5)
Methemoglobin: 1.1 % (ref 0.0–1.5)
O2 Saturation: 64.6 %
Total hemoglobin: 9.5 g/dL — ABNORMAL LOW (ref 12.0–16.0)

## 2023-03-09 LAB — PREPARE PLATELET PHERESIS
Unit division: 0
Unit division: 0

## 2023-03-09 LAB — SURGICAL PATHOLOGY

## 2023-03-09 LAB — BPAM PLATELET PHERESIS
Blood Product Expiration Date: 202409142359
Blood Product Expiration Date: 202409152359
ISSUE DATE / TIME: 202409121222
ISSUE DATE / TIME: 202409121739
Unit Type and Rh: 6200
Unit Type and Rh: 6200

## 2023-03-09 LAB — QUANTIFERON-TB GOLD PLUS: QuantiFERON-TB Gold Plus: NEGATIVE

## 2023-03-09 LAB — PREPARE RBC (CROSSMATCH)

## 2023-03-09 LAB — BRAIN NATRIURETIC PEPTIDE: B Natriuretic Peptide: 480.8 pg/mL — ABNORMAL HIGH (ref 0.0–100.0)

## 2023-03-09 LAB — PHOSPHORUS: Phosphorus: 4.6 mg/dL (ref 2.5–4.6)

## 2023-03-09 MED ORDER — ALBUMIN HUMAN 5 % IV SOLN
INTRAVENOUS | Status: AC
  Administered 2023-03-09: 12.5 g via INTRAVENOUS
  Filled 2023-03-09: qty 250

## 2023-03-09 MED ORDER — ALBUMIN HUMAN 5 % IV SOLN
12.5000 g | Freq: Once | INTRAVENOUS | Status: AC
  Administered 2023-03-09: 12.5 g via INTRAVENOUS

## 2023-03-09 MED ORDER — ALBUMIN HUMAN 5 % IV SOLN: 12.5000 g | Freq: Once | INTRAVENOUS | Status: AC

## 2023-03-09 MED ORDER — SODIUM CHLORIDE 0.9% IV SOLUTION: Freq: Once | INTRAVENOUS | Status: AC

## 2023-03-09 MED ORDER — INSULIN ASPART 100 UNIT/ML IJ SOLN
0.0000 [IU] | INTRAMUSCULAR | Status: DC
  Administered 2023-03-09: 2 [IU] via SUBCUTANEOUS
  Administered 2023-03-09: 4 [IU] via SUBCUTANEOUS
  Administered 2023-03-09 – 2023-03-12 (×14): 2 [IU] via SUBCUTANEOUS

## 2023-03-09 MED FILL — Thrombin (Recombinant) For Soln 20000 Unit: CUTANEOUS | Qty: 1 | Status: AC

## 2023-03-09 NOTE — Procedures (Signed)
Extubation Procedure Note  Patient Details:   Name: Steven [name] DOB: Jun 07, 1956 MRN: [id number]   Airway Documentation:    Vent end date: 03/09/23 Vent end time: 1100   Evaluation  O2 sats: stable throughout Complications: No apparent complications Patient did tolerate procedure well. Bilateral Breath Sounds: Clear, Diminished   Yes  Pt extubated per rapid weaning protcol. Placed on 3L [place]. Positive cuff leak noted, no stridor heard. NIF -25, VC 1.28 L. RT will continue to monitor.   Vicente Masson 03/09/2023, 11:04 AM

## 2023-03-09 NOTE — Progress Notes (Signed)
Physical Therapy Evaluation Patient Details Name: Steven [name] MRN: [id number] DOB: 1955/07/03 Today's Date: 03/09/2023  History of Present Illness  Pt is 67 yo male admitted on 02/26/23 for acute on chronic systolic and diastolic CHF with low output.  S/P implantation of LVAD Heartmate 3  (9/12). PMHx includes but not limited to ICM, CAD, VT s/p medtronic ICD, HLD, apical mural thrombus, CKD, subdural hematoma.  Clinical Impression  Post LVAD, post op day #1, pt is limited to exercise and sitting up into chair position to gage pt's tolerance to mobilize.  Pt unable to tolerate full upright positioning due to depressed flow rate and BP changes, but managed well at [facility] at 65 deg.  Will progress as able 9/14.        If plan is discharge home, recommend the following: A little help with walking and/or transfers;A little help with bathing/dressing/bathroom   Can travel by private vehicle        Equipment Recommendations Other (comment) (None likely, TBD)  Recommendations for Other Services       Functional Status Assessment Patient has had a recent decline in their functional status and demonstrates the ability to make significant improvements in function in a reasonable and predictable amount of time.     Precautions / Restrictions Precautions Precautions: Sternal;Other (comment) (LVAD)      Mobility  Bed Mobility               General bed mobility comments: Sat pt into chair position in ICU bed.  Pt unable to tolerate full upright  with drop in flow rate and pressures but maintained ~65 deg HOB free of kinks in the drains and VSS  see comments    Transfers                   General transfer comment: unable today  PO#1    Ambulation/Gait                  Stairs            Wheelchair Mobility     Tilt Bed    Modified Rankin (Stroke Patients Only)       Balance                                             Pertinent  Vitals/Pain Pain Assessment Pain Assessment: Faces Faces Pain Scale: Hurts little more Pain Location: sternal Pain Descriptors / Indicators: Sore Pain Intervention(s): Monitored during session, Limited activity within patient's tolerance    Home Living                          Prior Function Prior Level of Function : Independent/Modified Independent;Driving                     Extremity/Trunk Assessment   Upper Extremity Assessment Upper Extremity Assessment:  (moves against gravity in functional range.)    Lower Extremity Assessment Lower Extremity Assessment: Overall WFL for tasks assessed    Cervical / Trunk Assessment Cervical / Trunk Assessment: Normal  Communication   Communication Communication: No apparent difficulties  Cognition Arousal: Alert Behavior During Therapy: WFL for tasks assessed/performed Overall Cognitive Status: Within Functional Limits for tasks assessed  General Comments General comments (skin integrity, edema, etc.): Sitting up at ~ 65 deg in a chair position in bed.  BP 107/80, HR 80 bpm, RR 22 rpm, flow 3.9, Speed 5400,    Exercises Other Exercises Other Exercises: hip/knee flex/ext ROM with graded resistance x 10 reps bil Other Exercises: full ROM of bil UE's   Assessment/Plan    PT Assessment Patient needs continued PT services  PT Problem List Decreased strength;Decreased activity tolerance;Decreased mobility;Decreased knowledge of use of DME;Decreased safety awareness;Cardiopulmonary status limiting activity;Pain       PT Treatment Interventions DME instruction;Gait training;Stair training;Functional mobility training;Therapeutic activities;Patient/family education    PT Goals (Current goals can be found in the Care Plan section)  Acute Rehab PT Goals Patient Stated Goal: not stated PT Goal Formulation: With patient Time For Goal Achievement:  03/23/23 Potential to Achieve Goals: Good    Frequency Min 1X/week     Co-evaluation               AM-PAC PT "6 Clicks" Mobility  Outcome Measure Help needed turning from your back to your side while in a flat bed without using bedrails?: A Lot Help needed moving from lying on your back to sitting on the side of a flat bed without using bedrails?: A Lot Help needed moving to and from a bed to a chair (including a wheelchair)?: A Lot Help needed standing up from a chair using your arms (e.g., wheelchair or bedside chair)?: A Lot Help needed to walk in hospital room?: A Lot Help needed climbing 3-5 steps with a railing? : A Lot 6 Click Score: 12    End of Session Equipment Utilized During Treatment: Oxygen Activity Tolerance: Patient tolerated treatment well (limited today.) Patient left: in bed;with call bell/phone within reach Nurse Communication: Mobility status PT Visit Diagnosis: Other abnormalities of gait and mobility (R26.89);Other (comment) (New LVAD.)    Time: 1500-1525 PT Time Calculation (min) (ACUTE ONLY): 25 min   Charges:   PT Evaluation $PT Re-evaluation: 1 Re-eval PT Treatments $Therapeutic Activity: 8-22 mins PT General Charges $$ ACUTE PT VISIT: 1 Visit         03/09/2023  Jacinto Halim., PT Acute Rehabilitation Services [phone]  (office)  [name] 03/09/2023, 4:18 PM

## 2023-03-09 NOTE — Progress Notes (Addendum)
NO weaned and turned off at 1048 am.

## 2023-03-09 NOTE — Progress Notes (Signed)
OT Cancellation Note  Patient Details Name: Steven [name] MRN: [id number] DOB: 1956/05/26   Cancelled Treatment:    Reason Eval/Treat Not Completed: Medical issues which prohibited therapy.  Patient not appropriate for OOB or dangle this date.  PT able to perform bedlevel AROM.  OT to continue efforts.    Donnabelle Blanchard D Hopelynn Gartland 03/09/2023, 3:14 PM 03/09/2023  RP, OTR/L  Acute Rehabilitation Services  Office:  [phone]

## 2023-03-09 NOTE — Progress Notes (Signed)
LVAD Coordinator Rounding Note:  Admitted 02/26/23 due to CHF.   HM3 LVAD implanted on 03/08/23 by Dr Laneta Simmers under DT criteria.  Vital signs: HR: 62 Doppler Pressure:90 Art BP: 104/72 (92) O2 Sat: 97% Wt:155.6>169.5     LVAD interrogation reveals:   Speed:5400 Flow: 3.7 Power:  3.8 PI: 3.5  Alarms: none  Events:  2 PI today Hematocrit: 20 Fixed speed: 5400 Low speed limit: 5100   Drive Line: Existing VAD dressing removed and site care performed using sterile technique. Drive line exit site cleaned with Chlora prep applicators x 2, allowed to dry, and gauze dressing with Silver strip applied. There is a single suture present. The driveline has been pulled out overnight approx 1".  Exit site unincorporated, the velour is now exposed approx 1" at exit site. Small amount of bloody drainage. No redness, tenderness, foul odor or rash noted. Drive line anchor re-applied. Pt denies fever or chills. Next dressing due tomorrow 03/10/23 by nurse champion or VAD coordinator.         Labs:  LDH trend: 234  INR trend: 1.6  Anticoagulation Plan: -INR Goal: 2-2.5 -ASA Dose: 325 mg until INR therapeutic  Blood Products:  -Intra op: 03/08/23 1 PRBC 2 FFP 1 PLT 1 Cryo 450 cell saver  Post op: 03/08/23 1 PRBC 2 FFP 1 PLT 1 Cryo  03/09/23: 1 PRBC  Device: -Medtronic -Therapies: OFF  Arrythmias:   Respiratory:Intubated on 40% weaning NO at 4 ppm  Infection:   Renal:  -CRT: 1.28  Gtts: Fentanyl 25 mcg Vaso 0.02 u/hr Precedex 0.6 mcg/kg/hr Epi 2 mg/min Milrinone 0.125 mcg/kg/min Insulin per protocol  Adverse Events on VAD: -  Plan/Recommendations:  1. Please page VAD coordinator with any equipment issues or driveline problems. 2. Continue daily dressing changes using silver strip by VAD coordinator or nurse champion.    Carlton Adam RN, BSN VAD Coordinator 24/7 Pager [phone]

## 2023-03-09 NOTE — Progress Notes (Addendum)
Advanced Heart Failure VAD Team Note  PCP-Cardiologist: Dr. Gala Romney   Subjective:    9/12: HM3 LVAD Implant. Intra-op TEE LVEF 15%, RV normal   Driveline pulled, 1 inch of Velour exposed.   Speed increased to 5400 overnight. Flows < 4 and MAPs elevated. Started weaning Vaso.  Current gtts: Milrinone 0.125, Epi 2, Vaso 0.02. NE off. No 5 ppm.  Swan #s  CO-OX 65% CVP 11 PA 25/12 CO 4 CI 2.2 SVR 1422  Received 1 u platelets, 1 u RBCs and 2 albumin overnight.  1460 cc output from CT so far.  Sedated on vent. Wakes up and follows commands.   LVAD INTERROGATION:  HeartMate III LVAD:   Flow 3.6 liters/min, speed 5400, power 3.7, PI 3.3.    Objective:    Vital Signs:   Temp:  [95.7 F (35.4 C)-98.1 F (36.7 C)] 96.6 F (35.9 C) (09/13 0700) Pulse Rate:  [30-73] 30 (09/13 0700) Resp:  [13-27] 18 (09/13 0700) BP: (86-92)/(72-78) 92/77 (09/13 0400) SpO2:  [96 %-100 %] 98 % (09/13 0700) Arterial Line BP: (78-107)/(53-76) 100/67 (09/13 0700) FiO2 (%):  [40 %-50 %] 40 % (09/13 0315) Weight:  [76.9 kg] 76.9 kg (09/13 0155) Last BM Date : 03/07/23 Mean arterial Pressure  80s  Intake/Output:   Intake/Output Summary (Last 24 hours) at 03/09/2023 0735 Last data filed at 03/09/2023 0700 Gross per 24 hour  Intake 8902.29 ml  Output 4390 ml  Net 4512.29 ml     Physical Exam    Physical Exam: GENERAL: Intubated on sedated HEENT: + ETT NECK: Supple, JVP 10-12.  R internal jugular SWAN CARDIAC:  Dressing over sternum. + CTs. Mechanical heart sounds with LVAD hum present.  LUNGS:  Clear to auscultation bilaterally.  ABDOMEN:  Soft, round, nontender, positive bowel sounds x4.     LVAD exit site: Dressing dry and intact.  No erythema or drainage.  Stabilization device present and accurately applied.  EXTREMITIES:  Warm and dry, no cyanosis, clubbing, rash or edema  NEUROLOGIC: Sedated. Wakes up. GU: + foley   Telemetry   SR 60s, intermittently Apaced  EKG    No  new EKG to review   Labs   Basic Metabolic Panel: Recent Labs  Lab 03/07/23 0317 03/07/23 1500 03/08/23 0408 03/08/23 0823 03/08/23 1104 03/08/23 1156 03/08/23 1159 03/08/23 1253 03/08/23 1336 03/08/23 1357 03/08/23 1708 03/08/23 2015 03/08/23 2024 03/09/23 0356 03/09/23 0429  NA 133* 133* 133*   < > 136   < > 134*   < > 136   < > 135 136 138 139 138  K 4.6 4.4 4.3   < > 4.4   < > 5.4*   < > 3.6   < > 4.4 4.6 4.7 4.9 4.8  CL 102 104 104   < > 99  --  100  --  103  --   --  103  --   --  103  CO2 21* 23 23  --   --   --   --   --  25  --   --  23  --   --  22  GLUCOSE 121* 147* 123*   < > 231*  --  219*  --  70  --   --  125*  --   --  92  BUN 33* 30* 33*   < > 26*  --  35*  --  21  --   --  19  --   --  20  CREATININE 1.45* 1.46* 1.39*   < > 1.30*  --  1.20  --  1.21  --   --  1.26*  --   --  1.28*  CALCIUM 8.9 8.5* 8.7*  --   --   --   --   --  8.3*  --   --  8.1*  --   --  8.4*  MG 2.1  --  2.1  --   --   --   --   --  1.7  --   --  2.7*  --   --  2.4  PHOS  --   --   --   --   --   --   --   --   --   --   --   --   --   --  4.6   < > = values in this interval not displayed.    Liver Function Tests: Recent Labs  Lab 03/07/23 1500 03/09/23 0429  AST 35 54*  ALT 30 18  ALKPHOS 69 37*  BILITOT 0.9 4.2*  PROT 7.4 5.5*  ALBUMIN 3.4* 3.3*   No results for input(s): "LIPASE", "AMYLASE" in the last 168 hours. No results for input(s): "AMMONIA" in the last 168 hours.  CBC: Recent Labs  Lab 03/07/23 0317 03/08/23 0408 03/08/23 0823 03/08/23 1059 03/08/23 1104 03/08/23 1336 03/08/23 1357 03/08/23 1708 03/08/23 2015 03/08/23 2024 03/09/23 0356 03/09/23 0429  WBC 10.4 9.9  --   --   --  14.5*  --   --  13.1*  --   --  12.1*  NEUTROABS  --   --   --   --   --   --   --   --   --   --   --  9.1*  HGB 12.8* 12.6*   < > 7.7*   < > 8.1*   < > 8.5* 8.2* 8.2* 9.5* 9.4*  HCT 38.6* 38.2*   < > 23.3*   < > 24.6*   < > 25.0* 24.7* 24.0* 28.0* 27.3*  MCV 93.5 93.9  --    --   --  93.9  --   --  92.5  --   --  91.9  PLT 174 161  --  104*  --  85*  87*  --   --  108*  --   --  106*   < > = values in this interval not displayed.    INR: Recent Labs  Lab 03/06/23 0349 03/08/23 0408 03/08/23 1336 03/08/23 2015 03/09/23 0429  INR 1.2 1.3* 1.6* 1.5* 1.6*    Other results: EKG:    Imaging   DG Chest Port 1 View  Result Date: 03/08/2023 CLINICAL DATA:  [id number] LVAD (left ventricular assist device) present (HCC) [id number]. EXAM: PORTABLE CHEST 1 VIEW COMPARISON:  03/07/2023. FINDINGS: Interval median sternotomy and placement of a left ventricular assist device. Endotracheal tube tip projects over the midthoracic trachea. Unchanged Left chest AICD/pacemaker with leads projecting over the right atrium and ventricle. Unchanged right IJ approach pulmonary arterial catheter with tip projecting over the right main pulmonary artery. Pleural mediastinal chest tubes are in place. Enteric tube side port projects over the lower esophagus. Interval advancement is recommended. Left basilar atelectasis. No consolidation or pulmonary edema. No pleural effusion or pneumothorax. IMPRESSION: 1. Interval median sternotomy and placement of a left ventricular assist device. 2. Enteric tube side port projects over the lower  esophagus. Interval advancement is recommended. Electronically Signed   By: Orvan Falconer M.D.   On: 03/08/2023 15:31   ECHO INTRAOPERATIVE TEE  Result Date: 03/08/2023  *INTRAOPERATIVE TRANSESOPHAGEAL REPORT *  Patient Name:   Steven [name] [facility]   Date of Exam: 03/08/2023 Medical Rec #:  [id number]      Height:       69.0 in Accession #:    [id number]     Weight:       155.6 lb Date of Birth:  1956-04-10      BSA:          1.86 m Patient Age:    67 years       BP:           108/67 mmHg Patient Gender: M              HR:           71 bpm. Exam Location:  Anesthesiology Transesophogeal exam was perform intraoperatively during surgical procedure. Patient was closely monitored  under general anesthesia during the entirety of examination. Indications:     CAD; CHF Performing Phys: Anice Paganini DO Complications: No known complications during this procedure. POST-OP IMPRESSIONS _ Left Ventricle: Left ventricular function remains severely reduced post bypass. LVAD inflow cannula is visualized at LV apex, directed towards the mitral valve. _ Right Ventricle: Right ventricular function is normal on inotropic support. _ Aorta: The aorta appears unchanged from pre-bypass. There is no dissection present in the aorta. _ Left Atrial Appendage: The left atrial appendage appears unchanged from pre-bypass. _ Aortic Valve: The aortic valve appears unchanged from pre-bypass. The valve is not visualized opening with LV contraction following initiation of LVAD. _ Mitral Valve: No regurgitation post repair. _ Tricuspid Valve: There is mild regurgitation. _ Pulmonic Valve: The pulmonic valve appears unchanged from pre-bypass. _ Interatrial Septum: Remains midline on 5200RPMs. _ Interventricular Septum: Remains midline on 5200RPMs. PRE-OP FINDINGS  Left Ventricle: The left ventricle has severely reduced systolic function, with an ejection fraction of 14.6% by Simpsons biplane method of discs. No evidence of LV thrombus. Right Ventricle: The right ventricle has normal systolic function. The cavity was normal. There is no increase in right ventricular wall thickness. FAC 42.8%. TAPSE 1.8cm Left Atrium: No left atrial/left atrial appendage thrombus was detected. Right Atrium: Right atrial size was normal in size. Interatrial Septum: No atrial level shunt detected by color flow Doppler. Agitated saline contrast bubble study was negative, with no evidence of any interatrial shunt. Pericardium: There is no evidence of pericardial effusion. There is no pleural effusion. Mitral Valve: The mitral valve is normal in structure. There is restricted motion of both leaflets with a coaptation point 1.5cm below the annulus.  Mitral valve regurgitation is mild by color flow Doppler. The MR jet is centrally-directed (Carpentier type IIIb). Tricuspid Valve: The tricuspid valve was normal in structure. Tricuspid valve regurgitation is mild by color flow Doppler. Tricuspid annulus measures 4.03cm in diameter. Aortic Valve: The aortic valve is normal in structure. Aortic valve regurgitation was not visualized by color flow Doppler. There is no stenosis of the aortic valve. Pulmonic Valve: The pulmonic valve was normal in structure. Pulmonic valve regurgitation is trivial by color flow Doppler. Aorta: The aortic root is normal in size and structure.  Anice Paganini Electronically signed by Anice Paganini Signature Date/Time: 03/08/2023/3:18:15 PM    Final    EP STUDY  Result Date: 03/08/2023 See surgical note for result.    Medications:  Scheduled Medications:  acetaminophen  1,000 mg Oral Q6H   Or   acetaminophen (TYLENOL) oral liquid 160 mg/5 mL  1,000 mg Per Tube Q6H   amiodarone  200 mg Oral Daily   aspirin EC  325 mg Oral Daily   Or   aspirin  324 mg Per Tube Daily   Or   aspirin  300 mg Rectal Daily   atorvastatin  80 mg Oral Daily   bisacodyl  10 mg Oral Daily   Or   bisacodyl  10 mg Rectal Daily   Chlorhexidine Gluconate Cloth  6 each Topical Daily   docusate sodium  200 mg Oral Daily   feeding supplement  237 mL Oral TID BM   metoCLOPramide (REGLAN) injection  10 mg Intravenous Q6H   multivitamin with minerals  1 tablet Oral Daily   mouth rinse  15 mL Mouth Rinse Q2H   pantoprazole  40 mg Oral Daily   sodium chloride flush  3 mL Intravenous Q12H   tamsulosin  0.4 mg Oral QPC supper    Infusions:  sodium chloride     sodium chloride     sodium chloride 10 mL/hr at 03/09/23 0700    ceFAZolin (ANCEF) IV Stopped (03/09/23 0540)   dexmedetomidine (PRECEDEX) IV infusion 0.7 mcg/kg/hr (03/09/23 0700)   epinephrine 2 mcg/min (03/09/23 0700)   fentaNYL infusion INTRAVENOUS 100 mcg/hr (03/09/23 0700)    fluconazole (DIFLUCAN) IV 100 mL/hr at 03/09/23 0700   insulin 0.3 Units/hr (03/09/23 0700)   lactated ringers     lactated ringers     lactated ringers 20 mL/hr at 03/09/23 0700   milrinone 0.125 mcg/kg/min (03/09/23 0700)   norepinephrine (LEVOPHED) Adult infusion Stopped (03/09/23 0044)   rifampin (RIFADIN) 600 mg in sodium chloride 0.9 % 100 mL IVPB     vancomycin     vasopressin 0.02 Units/min (03/09/23 0700)    PRN Medications: sodium chloride, dextrose, lactated ringers, midazolam, morphine injection, ondansetron (ZOFRAN) IV, mouth rinse, oxyCODONE, sodium chloride flush, traMADol   Patient Profile   Steven [name] is a 67 y.o. 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    Admitted with acute on chronic systolic and diastolic CHF with low-output. Advanced Heart Failure consulted and workup started for advanced therapies. S/p HM3 LVAD 9/12.   Assessment/Plan:    1. 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 CO 3.95. CI 2.15 - end-stage HF w/ low output and inotrope dependent. GDMT limited by renal function and hypotension. - s/p HM3 LVAD implant 9/12 - intra-op TEE LVEF 15%, RV normal  - Speed increased to 5400 overnight - Current gtts: Milrinone 0.125, Epi 2, Vaso 0.02. CI 2.2. Weaning Vaso to keep MAP 70s-80s.  - CVP 11. ? Low-dose IV lasix later today if flows stable - Still with output from CTs, keeping today - vent wean protocol per CT surgery/RT >> hoping to extubate this am - Planning to start  coumadin tomorrow per TCTS. Raw surface and calcified pericardium from prior  pericarditis.   CAD - h/o large anterior MI 2013 treated w/ DES to LAD  - on statin  - on ASA  - no s/s angina   AKI on CKD IIIa - Scr up to 2 this admission, baseline ~1.2 - cardiorenal. Optimized pre-vad w/ milrinone and NE.  - Scr stable 1.3 post-op - Target MAPs 70s-80s  4. Mural thrombus - On coumadin PTA - Restarting coumadin tomorrow as above   H/o VT s/p Medtronic ICD - likely scar mediated - Continue amiodarone 200 mg daily - s/p MDT ICD, last event 8/24 - Keep K>4, Mg >2 - stable  6. Pulmonary  - Intubated for VAD implant  - vent wean protocol per CT surgery/RT, potentially extubate later this am    I reviewed the LVAD parameters from today, and compared the results to the patient's prior recorded data.  No programming changes were made.  The LVAD is functioning within specified parameters.  The patient performs LVAD self-test daily.  LVAD interrogation was negative for any significant power changes, alarms or PI events/speed drops.  LVAD equipment check completed and is in good working order.  Back-up equipment present.   LVAD education done on emergency procedures and precautions and reviewed exit site care.  Length of Stay: [address] Dalbert Garnet, PA-C 03/09/2023, 7:35 AM  VAD Team --- VAD ISSUES ONLY--- Pager [phone] (7am - 7am)  Advanced Heart Failure Team  Pager [phone] (M-F; 7a - 5p)  Please contact [facility] for night-coverage after hours (5p -7a ) and weekends on [email or web address]

## 2023-03-09 NOTE — Progress Notes (Signed)
   Inpatient Rehab Admissions Coordinator :  Per therapy recommendations patient was screened for CIR candidacy by Ottie Glazier RN MSN. Patient is not yet at a level to tolerate the intensity required to pursue a CIR admit.Noted new LVAD. The CIR admissions team will follow and monitor for progress and place a Rehab Consult order if felt to be appropriate. Please contact me with any questions.  Ottie Glazier RN MSN Admissions Coordinator [phone]

## 2023-03-09 NOTE — Progress Notes (Signed)
1.7  --   --  2.7*  --   --  2.4  PHOS  --   --   --   --   --   --   --   --   --   --   --   --   --   --  4.6   < > = values in this interval not displayed.    Liver Function Tests: Recent Labs  Lab 03/07/23 1500 03/09/23 0429  AST 35 54*  ALT 30 18  ALKPHOS 69 37*  BILITOT 0.9 4.2*  PROT 7.4 5.5*  ALBUMIN 3.4* 3.3*   No results for input(s): "LIPASE", "AMYLASE" in the last 168 hours. No results for input(s): "AMMONIA" in the last 168 hours.  CBC: Recent Labs  Lab 03/07/23 0317 03/08/23 0408 03/08/23 0823 03/08/23 1059 03/08/23 1104 03/08/23 1336 03/08/23 1357 03/08/23 1708 03/08/23 2015 03/08/23 2024 03/09/23 0356 03/09/23 0429  WBC 10.4 9.9  --   --   --  14.5*  --   --  13.1*  --   --  12.1*  NEUTROABS  --   --   --   --   --   --   --   --   --   --   --  9.1*  HGB 12.8* 12.6*   < > 7.7*   < > 8.1*   < > 8.5* 8.2* 8.2* 9.5* 9.4*  HCT 38.6* 38.2*   < > 23.3*   < > 24.6*   < > 25.0* 24.7* 24.0* 28.0* 27.3*  MCV 93.5 93.9  --   --   --  93.9  --   --  92.5  --   --  91.9  PLT 174 161  --  104*  --  85*  87*  --   --  108*  --   --  106*   < > = values in this interval not displayed.    INR: Recent Labs  Lab 03/06/23 0349 03/08/23 0408 03/08/23 1336 03/08/23 2015 03/09/23 0429  INR 1.2 1.3* 1.6* 1.5* 1.6*    Other results: EKG:   Imaging: DG Chest Port 1 View  Result Date:  03/08/2023 CLINICAL DATA:  [id number] LVAD (left ventricular assist device) present (HCC) 284132. EXAM: PORTABLE CHEST 1 VIEW COMPARISON:  03/07/2023. FINDINGS: Interval median sternotomy and placement of a left ventricular assist device. Endotracheal tube tip projects over the midthoracic trachea. Unchanged Left chest AICD/pacemaker with leads projecting over the right atrium and ventricle. Unchanged right IJ approach pulmonary arterial catheter with tip projecting over the right main pulmonary artery. Pleural mediastinal chest tubes are in place. Enteric tube side port projects over the lower esophagus. Interval advancement is recommended. Left basilar atelectasis. No consolidation or pulmonary edema. No pleural effusion or pneumothorax. IMPRESSION: 1. Interval median sternotomy and placement of a left ventricular assist device. 2. Enteric tube side port projects over the lower esophagus. Interval advancement is recommended. Electronically Signed   By: Orvan Falconer M.D.   On: 03/08/2023 15:31   ECHO INTRAOPERATIVE TEE  Result Date: 03/08/2023  *INTRAOPERATIVE TRANSESOPHAGEAL REPORT *  Patient Name:   Steven [name] [facility]   Date of Exam: 03/08/2023 Medical Rec #:  [id number]      Height:       69.0 in Accession #:    [id number]     Weight:       155.6 lb Date of Birth:  1955-10-15  Patient ID: Steven [name], male   DOB: Jun 14, 1956, 67 y.o.   MRN: [id number] HeartMate 3 Rounding Note  Subjective:    Stable hemodynamics overnight although flow has been less than 4. I increased speed to 5300 around MN and 5400 this am since PI over 5 and pulsatile arterial tracing. Flow seems to be related to MAP. We have been weaning vaso since MAP got up over 100. MAP still 86 on epi 2, vaso 0.2.  CI 1.87 and Co-ox 65 this am. Still on milrinone 0.125. NO down to 5 ppm.  PA 28/15 CVP11-12   LVAD INTERROGATION:  HeartMate IIl LVAD:  Flow 3.9 liters/min, speed 5400, power 3.7, PI 4.6.     Objective:    Vital Signs:   Temp:  [95.7 F (35.4 C)-98.1 F (36.7 C)] 96.8 F (36 C) (09/13 0645) Pulse Rate:  [30-73] 30 (09/13 0645) Resp:  [13-27] 18 (09/13 0645) BP: (86-111)/(62-78) 92/77 (09/13 0400) SpO2:  [94 %-100 %] 98 % (09/13 0645) Arterial Line BP: (78-107)/(53-76) 107/75 (09/13 0645) FiO2 (%):  [40 %-50 %] 40 % (09/13 0315) Weight:  [76.9 kg] 76.9 kg (09/13 0155) Last BM Date : 03/07/23 Mean arterial Pressure 86  Intake/Output:   Intake/Output Summary (Last 24 hours) at 03/09/2023 0656 Last data filed at 03/09/2023 0600 Gross per 24 hour  Intake 8731.46 ml  Output 4320 ml  Net 4411.46 ml     Physical Exam: General:  Intubated but awakens and follows commands. Still on Precedex. Cor: Normal heart sounds with LVAD hum present. Lungs: clear Abdomen: soft, nontender, nondistended. Hypoactive bowel sounds. Extremities: moderate edema Neuro: still sedated but seems neuro intact  Telemetry: sinus with internal V-pacing.  Labs: Basic Metabolic Panel: Recent Labs  Lab 03/07/23 0317 03/07/23 1500 03/08/23 0408 03/08/23 0823 03/08/23 1104 03/08/23 1156 03/08/23 1159 03/08/23 1253 03/08/23 1336 03/08/23 1357 03/08/23 1708 03/08/23 2015 03/08/23 2024 03/09/23 0356 03/09/23 0429  NA 133* 133* 133*   < > 136   < > 134*   < > 136   < > 135 136 138 139 138  K 4.6  4.4 4.3   < > 4.4   < > 5.4*   < > 3.6   < > 4.4 4.6 4.7 4.9 4.8  CL 102 104 104   < > 99  --  100  --  103  --   --  103  --   --  103  CO2 21* 23 23  --   --   --   --   --  25  --   --  23  --   --  22  GLUCOSE 121* 147* 123*   < > 231*  --  219*  --  70  --   --  125*  --   --  92  BUN 33* 30* 33*   < > 26*  --  35*  --  21  --   --  19  --   --  20  CREATININE 1.45* 1.46* 1.39*   < > 1.30*  --  1.20  --  1.21  --   --  1.26*  --   --  1.28*  CALCIUM 8.9 8.5* 8.7*  --   --   --   --   --  8.3*  --   --  8.1*  --   --  8.4*  MG 2.1  --  2.1  --   --   --   --   --  1.7  --   --  2.7*  --   --  2.4  PHOS  --   --   --   --   --   --   --   --   --   --   --   --   --   --  4.6   < > = values in this interval not displayed.    Liver Function Tests: Recent Labs  Lab 03/07/23 1500 03/09/23 0429  AST 35 54*  ALT 30 18  ALKPHOS 69 37*  BILITOT 0.9 4.2*  PROT 7.4 5.5*  ALBUMIN 3.4* 3.3*   No results for input(s): "LIPASE", "AMYLASE" in the last 168 hours. No results for input(s): "AMMONIA" in the last 168 hours.  CBC: Recent Labs  Lab 03/07/23 0317 03/08/23 0408 03/08/23 0823 03/08/23 1059 03/08/23 1104 03/08/23 1336 03/08/23 1357 03/08/23 1708 03/08/23 2015 03/08/23 2024 03/09/23 0356 03/09/23 0429  WBC 10.4 9.9  --   --   --  14.5*  --   --  13.1*  --   --  12.1*  NEUTROABS  --   --   --   --   --   --   --   --   --   --   --  9.1*  HGB 12.8* 12.6*   < > 7.7*   < > 8.1*   < > 8.5* 8.2* 8.2* 9.5* 9.4*  HCT 38.6* 38.2*   < > 23.3*   < > 24.6*   < > 25.0* 24.7* 24.0* 28.0* 27.3*  MCV 93.5 93.9  --   --   --  93.9  --   --  92.5  --   --  91.9  PLT 174 161  --  104*  --  85*  87*  --   --  108*  --   --  106*   < > = values in this interval not displayed.    INR: Recent Labs  Lab 03/06/23 0349 03/08/23 0408 03/08/23 1336 03/08/23 2015 03/09/23 0429  INR 1.2 1.3* 1.6* 1.5* 1.6*    Other results: EKG:   Imaging: DG Chest Port 1 View  Result Date:  03/08/2023 CLINICAL DATA:  [id number] LVAD (left ventricular assist device) present (HCC) 284132. EXAM: PORTABLE CHEST 1 VIEW COMPARISON:  03/07/2023. FINDINGS: Interval median sternotomy and placement of a left ventricular assist device. Endotracheal tube tip projects over the midthoracic trachea. Unchanged Left chest AICD/pacemaker with leads projecting over the right atrium and ventricle. Unchanged right IJ approach pulmonary arterial catheter with tip projecting over the right main pulmonary artery. Pleural mediastinal chest tubes are in place. Enteric tube side port projects over the lower esophagus. Interval advancement is recommended. Left basilar atelectasis. No consolidation or pulmonary edema. No pleural effusion or pneumothorax. IMPRESSION: 1. Interval median sternotomy and placement of a left ventricular assist device. 2. Enteric tube side port projects over the lower esophagus. Interval advancement is recommended. Electronically Signed   By: Orvan Falconer M.D.   On: 03/08/2023 15:31   ECHO INTRAOPERATIVE TEE  Result Date: 03/08/2023  *INTRAOPERATIVE TRANSESOPHAGEAL REPORT *  Patient Name:   Steven [name] [facility]   Date of Exam: 03/08/2023 Medical Rec #:  [id number]      Height:       69.0 in Accession #:    [id number]     Weight:       155.6 lb Date of Birth:  1955-10-15  1.7  --   --  2.7*  --   --  2.4  PHOS  --   --   --   --   --   --   --   --   --   --   --   --   --   --  4.6   < > = values in this interval not displayed.    Liver Function Tests: Recent Labs  Lab 03/07/23 1500 03/09/23 0429  AST 35 54*  ALT 30 18  ALKPHOS 69 37*  BILITOT 0.9 4.2*  PROT 7.4 5.5*  ALBUMIN 3.4* 3.3*   No results for input(s): "LIPASE", "AMYLASE" in the last 168 hours. No results for input(s): "AMMONIA" in the last 168 hours.  CBC: Recent Labs  Lab 03/07/23 0317 03/08/23 0408 03/08/23 0823 03/08/23 1059 03/08/23 1104 03/08/23 1336 03/08/23 1357 03/08/23 1708 03/08/23 2015 03/08/23 2024 03/09/23 0356 03/09/23 0429  WBC 10.4 9.9  --   --   --  14.5*  --   --  13.1*  --   --  12.1*  NEUTROABS  --   --   --   --   --   --   --   --   --   --   --  9.1*  HGB 12.8* 12.6*   < > 7.7*   < > 8.1*   < > 8.5* 8.2* 8.2* 9.5* 9.4*  HCT 38.6* 38.2*   < > 23.3*   < > 24.6*   < > 25.0* 24.7* 24.0* 28.0* 27.3*  MCV 93.5 93.9  --   --   --  93.9  --   --  92.5  --   --  91.9  PLT 174 161  --  104*  --  85*  87*  --   --  108*  --   --  106*   < > = values in this interval not displayed.    INR: Recent Labs  Lab 03/06/23 0349 03/08/23 0408 03/08/23 1336 03/08/23 2015 03/09/23 0429  INR 1.2 1.3* 1.6* 1.5* 1.6*    Other results: EKG:   Imaging: DG Chest Port 1 View  Result Date:  03/08/2023 CLINICAL DATA:  [id number] LVAD (left ventricular assist device) present (HCC) 284132. EXAM: PORTABLE CHEST 1 VIEW COMPARISON:  03/07/2023. FINDINGS: Interval median sternotomy and placement of a left ventricular assist device. Endotracheal tube tip projects over the midthoracic trachea. Unchanged Left chest AICD/pacemaker with leads projecting over the right atrium and ventricle. Unchanged right IJ approach pulmonary arterial catheter with tip projecting over the right main pulmonary artery. Pleural mediastinal chest tubes are in place. Enteric tube side port projects over the lower esophagus. Interval advancement is recommended. Left basilar atelectasis. No consolidation or pulmonary edema. No pleural effusion or pneumothorax. IMPRESSION: 1. Interval median sternotomy and placement of a left ventricular assist device. 2. Enteric tube side port projects over the lower esophagus. Interval advancement is recommended. Electronically Signed   By: Orvan Falconer M.D.   On: 03/08/2023 15:31   ECHO INTRAOPERATIVE TEE  Result Date: 03/08/2023  *INTRAOPERATIVE TRANSESOPHAGEAL REPORT *  Patient Name:   Steven [name] [facility]   Date of Exam: 03/08/2023 Medical Rec #:  [id number]      Height:       69.0 in Accession #:    [id number]     Weight:       155.6 lb Date of Birth:  1955-10-15

## 2023-03-09 NOTE — Progress Notes (Signed)
Nutrition Follow-up  DOCUMENTATION CODES:   Not applicable  INTERVENTION:   Ensure Enlive po TID once diet advance, each supplement provides 350 kcal and 20 grams of protein.  NUTRITION DIAGNOSIS:   Increased nutrient needs related to chronic illness as evidenced by estimated needs.  Continues  GOAL:   Patient will meet greater than or equal to 90% of their needs  Being addressed  MONITOR:   PO intake, Supplement acceptance, Weight trends, Skin, I & O's  REASON FOR ASSESSMENT:   Consult LVAD Eval  ASSESSMENT:   67 y.o.male with PMHx including ischemic cardiomyopathy, systolic heart failure, LV thrombus, hx of ventricular tachycardia s/p ICD, HTN, CKD 3, HLD who presents with acute onset of dyspnea and nausea.  9/12 HM3 LVAD Implant, Intra-op TEE LVEF 15%, RV normal 9/13 Extubated  Extubated this AM, remains NPO  Chest tubes with 1460 mL in 24 hours, 210 mL thus far today  UOP 1985 mL in 24 hours Weight up to 76.9 kg postop. Lowest wt this admission 67 kg  Labs: reviewed Meds: ss novolog, dulcolax, reglan, MVI, KCl  Diet Order:   Diet Order             Diet NPO time specified Except for: Sips with Meds  Diet effective now                   EDUCATION NEEDS:   Education needs have been addressed  Skin:  Skin Assessment: Skin Integrity Issues: Skin Integrity Issues:: Incisions Incisions: driveline (new LVAD)  Last BM:  9/11  Height:   Ht Readings from Last 1 Encounters:  02/26/23 5\' 9"  (1.753 m)    Weight:   Wt Readings from Last 1 Encounters:  03/09/23 76.9 kg    BMI:  Body mass index is 25.04 kg/m.  Estimated Nutritional Needs:   Kcal:  2000-2200 kcals  Protein:  95-110 g  Fluid:  1.8 L  Romelle Starcher MS, RDN, LDN, CNSC Registered Dietitian 3 Clinical Nutrition RD Pager and On-Call Pager Number Located in [place]

## 2023-03-10 ENCOUNTER — Inpatient Hospital Stay (HOSPITAL_COMMUNITY): Payer: 59

## 2023-03-10 DIAGNOSIS — I5023 Acute on chronic systolic (congestive) heart failure: Secondary | ICD-10-CM | POA: Diagnosis not present

## 2023-03-10 LAB — COMPREHENSIVE METABOLIC PANEL
ALT: 21 U/L (ref 0–44)
AST: 76 U/L — ABNORMAL HIGH (ref 15–41)
Albumin: 3.5 g/dL (ref 3.5–5.0)
Alkaline Phosphatase: 45 U/L (ref 38–126)
Anion gap: 10 (ref 5–15)
BUN: 21 mg/dL (ref 8–23)
CO2: 21 mmol/L — ABNORMAL LOW (ref 22–32)
Calcium: 8.5 mg/dL — ABNORMAL LOW (ref 8.9–10.3)
Chloride: 102 mmol/L (ref 98–111)
Creatinine, Ser: 1.5 mg/dL — ABNORMAL HIGH (ref 0.61–1.24)
GFR, Estimated: 50 mL/min — ABNORMAL LOW (ref >60)
Glucose, Bld: 152 mg/dL — ABNORMAL HIGH (ref 70–99)
Potassium: 4.4 mmol/L (ref 3.5–5.1)
Sodium: 133 mmol/L — ABNORMAL LOW (ref 135–145)
Total Bilirubin: 5.9 mg/dL — ABNORMAL HIGH (ref 0.3–1.2)
Total Protein: 6.2 g/dL — ABNORMAL LOW (ref 6.5–8.1)

## 2023-03-10 LAB — GLUCOSE, CAPILLARY
Glucose-Capillary: 134 mg/dL — ABNORMAL HIGH (ref 70–99)
Glucose-Capillary: 135 mg/dL — ABNORMAL HIGH (ref 70–99)
Glucose-Capillary: 137 mg/dL — ABNORMAL HIGH (ref 70–99)
Glucose-Capillary: 143 mg/dL — ABNORMAL HIGH (ref 70–99)
Glucose-Capillary: 147 mg/dL — ABNORMAL HIGH (ref 70–99)
Glucose-Capillary: 148 mg/dL — ABNORMAL HIGH (ref 70–99)
Glucose-Capillary: 149 mg/dL — ABNORMAL HIGH (ref 70–99)

## 2023-03-10 LAB — CBC WITH DIFFERENTIAL/PLATELET
Abs Immature Granulocytes: 0.11 10*3/uL — ABNORMAL HIGH (ref 0.00–0.07)
Basophils Absolute: 0 10*3/uL (ref 0.0–0.1)
Basophils Relative: 0 %
Eosinophils Absolute: 0.2 10*3/uL (ref 0.0–0.5)
Eosinophils Relative: 1 %
HCT: 25.6 % — ABNORMAL LOW (ref 39.0–52.0)
Hemoglobin: 8.5 g/dL — ABNORMAL LOW (ref 13.0–17.0)
Immature Granulocytes: 1 %
Lymphocytes Relative: 3 %
Lymphs Abs: 0.6 10*3/uL — ABNORMAL LOW (ref 0.7–4.0)
MCH: 30.4 pg (ref 26.0–34.0)
MCHC: 33.2 g/dL (ref 30.0–36.0)
MCV: 91.4 fL (ref 80.0–100.0)
Monocytes Absolute: 1.6 10*3/uL — ABNORMAL HIGH (ref 0.1–1.0)
Monocytes Relative: 9 %
Neutro Abs: 15 10*3/uL — ABNORMAL HIGH (ref 1.7–7.7)
Neutrophils Relative %: 86 %
Platelets: 121 10*3/uL — ABNORMAL LOW (ref 150–400)
RBC: 2.8 MIL/uL — ABNORMAL LOW (ref 4.22–5.81)
RDW: 16 % — ABNORMAL HIGH (ref 11.5–15.5)
WBC: 17.4 10*3/uL — ABNORMAL HIGH (ref 4.0–10.5)
nRBC: 0 % (ref 0.0–0.2)

## 2023-03-10 LAB — BASIC METABOLIC PANEL
Anion gap: 13 (ref 5–15)
BUN: 24 mg/dL — ABNORMAL HIGH (ref 8–23)
CO2: 19 mmol/L — ABNORMAL LOW (ref 22–32)
Calcium: 8.6 mg/dL — ABNORMAL LOW (ref 8.9–10.3)
Chloride: 99 mmol/L (ref 98–111)
Creatinine, Ser: 1.6 mg/dL — ABNORMAL HIGH (ref 0.61–1.24)
GFR, Estimated: 47 mL/min — ABNORMAL LOW (ref >60)
Glucose, Bld: 169 mg/dL — ABNORMAL HIGH (ref 70–99)
Potassium: 3.7 mmol/L (ref 3.5–5.1)
Sodium: 131 mmol/L — ABNORMAL LOW (ref 135–145)

## 2023-03-10 LAB — POCT I-STAT 7, (LYTES, BLD GAS, ICA,H+H)
Acid-base deficit: 4 mmol/L — ABNORMAL HIGH (ref 0.0–2.0)
Acid-base deficit: 2 mmol/L (ref 0.0–2.0)
Bicarbonate: 19.5 mmol/L — ABNORMAL LOW (ref 20.0–28.0)
Bicarbonate: 20.1 mmol/L (ref 20.0–28.0)
Calcium, Ion: 1.16 mmol/L (ref 1.15–1.40)
Calcium, Ion: 1.11 mmol/L — ABNORMAL LOW (ref 1.15–1.40)
HCT: 26 % — ABNORMAL LOW (ref 39.0–52.0)
HCT: 27 % — ABNORMAL LOW (ref 39.0–52.0)
Hemoglobin: 8.8 g/dL — ABNORMAL LOW (ref 13.0–17.0)
Hemoglobin: 9.2 g/dL — ABNORMAL LOW (ref 13.0–17.0)
O2 Saturation: 95 %
O2 Saturation: 93 %
Patient temperature: 37.5
Patient temperature: 36.9
Potassium: 4.6 mmol/L (ref 3.5–5.1)
Potassium: 3.8 mmol/L (ref 3.5–5.1)
Sodium: 135 mmol/L (ref 135–145)
Sodium: 134 mmol/L — ABNORMAL LOW (ref 135–145)
TCO2: 20 mmol/L — ABNORMAL LOW (ref 22–32)
TCO2: 21 mmol/L — ABNORMAL LOW (ref 22–32)
pCO2 arterial: 30.6 mmHg — ABNORMAL LOW (ref 32–48)
pCO2 arterial: 26 mmHg — ABNORMAL LOW (ref 32–48)
pH, Arterial: 7.415 (ref 7.35–7.45)
pH, Arterial: 7.496 — ABNORMAL HIGH (ref 7.35–7.45)
pO2, Arterial: 77 mmHg — ABNORMAL LOW (ref 83–108)
pO2, Arterial: 61 mmHg — ABNORMAL LOW (ref 83–108)

## 2023-03-10 LAB — MAGNESIUM
Magnesium: 2.5 mg/dL — ABNORMAL HIGH (ref 1.7–2.4)
Magnesium: 2.4 mg/dL (ref 1.7–2.4)

## 2023-03-10 LAB — CALCIUM, IONIZED: Calcium, Ionized, Serum: 4.7 mg/dL (ref 4.5–5.6)

## 2023-03-10 LAB — COOXEMETRY PANEL
Carboxyhemoglobin: 2.1 % — ABNORMAL HIGH (ref 0.5–1.5)
Methemoglobin: 0.7 % (ref 0.0–1.5)
O2 Saturation: 57.1 %
Total hemoglobin: 9.3 g/dL — ABNORMAL LOW (ref 12.0–16.0)

## 2023-03-10 LAB — PHOSPHORUS: Phosphorus: 3.4 mg/dL (ref 2.5–4.6)

## 2023-03-10 LAB — PROTIME-INR
INR: 2.1 — ABNORMAL HIGH (ref 0.8–1.2)
Prothrombin Time: 24 s — ABNORMAL HIGH (ref 11.4–15.2)

## 2023-03-10 LAB — LACTATE DEHYDROGENASE: LDH: 269 U/L — ABNORMAL HIGH (ref 98–192)

## 2023-03-10 MED ORDER — AMIODARONE LOAD VIA INFUSION
150.0000 mg | Freq: Once | INTRAVENOUS | Status: AC
  Administered 2023-03-10: 150 mg via INTRAVENOUS
  Filled 2023-03-10: qty 83.34

## 2023-03-10 MED ORDER — POTASSIUM CHLORIDE CRYS ER 20 MEQ PO TBCR
40.0000 meq | EXTENDED_RELEASE_TABLET | Freq: Once | ORAL | Status: AC
  Administered 2023-03-10: 40 meq via ORAL
  Filled 2023-03-10: qty 2

## 2023-03-10 MED ORDER — ALBUMIN HUMAN 5 % IV SOLN
INTRAVENOUS | Status: AC
  Filled 2023-03-10: qty 250

## 2023-03-10 MED ORDER — VANCOMYCIN HCL 1250 MG/250ML IV SOLN
1250.0000 mg | Freq: Once | INTRAVENOUS | Status: AC
  Administered 2023-03-10: 1250 mg via INTRAVENOUS
  Filled 2023-03-10: qty 250

## 2023-03-10 MED ORDER — FUROSEMIDE 10 MG/ML IJ SOLN
60.0000 mg | Freq: Two times a day (BID) | INTRAMUSCULAR | Status: AC
  Administered 2023-03-10 (×2): 60 mg via INTRAVENOUS
  Filled 2023-03-10 (×2): qty 6

## 2023-03-10 MED ORDER — AMIODARONE HCL IN DEXTROSE 360-4.14 MG/200ML-% IV SOLN
60.0000 mg/h | INTRAVENOUS | Status: AC
  Administered 2023-03-10 (×4): 60 mg/h via INTRAVENOUS
  Filled 2023-03-10: qty 400
  Filled 2023-03-10 (×3): qty 200

## 2023-03-10 MED ORDER — AMIODARONE IV BOLUS ONLY 150 MG/100ML: 150.0000 mg | Freq: Once | INTRAVENOUS | Status: DC

## 2023-03-10 MED ORDER — AMIODARONE HCL IN DEXTROSE 360-4.14 MG/200ML-% IV SOLN
30.0000 mg/h | INTRAVENOUS | Status: DC
  Administered 2023-03-11 (×5): 60 mg/h via INTRAVENOUS
  Administered 2023-03-12: 30 mg/h via INTRAVENOUS
  Administered 2023-03-12: 60 mg/h via INTRAVENOUS
  Administered 2023-03-12: 30 mg/h via INTRAVENOUS
  Administered 2023-03-13 – 2023-03-14 (×4): 60 mg/h via INTRAVENOUS
  Administered 2023-03-14 – 2023-03-15 (×2): 30 mg/h via INTRAVENOUS
  Filled 2023-03-10 (×13): qty 200

## 2023-03-10 MED ORDER — AMIODARONE LOAD VIA INFUSION
150.0000 mg | Freq: Once | INTRAVENOUS | Status: AC
  Administered 2023-03-11: 150 mg via INTRAVENOUS

## 2023-03-10 MED ORDER — HYDRALAZINE HCL 20 MG/ML IJ SOLN
10.0000 mg | INTRAMUSCULAR | Status: DC | PRN
  Administered 2023-03-10 – 2023-03-12 (×3): 10 mg via INTRAVENOUS
  Filled 2023-03-10 (×4): qty 1

## 2023-03-10 MED ORDER — AMIODARONE HCL IN DEXTROSE 360-4.14 MG/200ML-% IV SOLN
INTRAVENOUS | Status: AC
  Administered 2023-03-10: 60 mg/h via INTRAVENOUS
  Filled 2023-03-10: qty 200

## 2023-03-10 MED ORDER — POTASSIUM CHLORIDE 10 MEQ/50ML IV SOLN
10.0000 meq | INTRAVENOUS | Status: AC
  Administered 2023-03-10 (×3): 10 meq via INTRAVENOUS
  Filled 2023-03-10 (×3): qty 50

## 2023-03-10 NOTE — Evaluation (Signed)
Occupational Therapy Evaluation Patient Details Name: Steven [name] MRN: [id number] DOB: 1956/05/20 Today's Date: 03/10/2023   History of Present Illness Pt is 67 yo male admitted on 02/26/23 for acute on chronic systolic and diastolic CHF with low output.  S/P implantation of LVAD Heartmate 3  (9/12). PMHx includes but not limited to ICM, CAD, VT s/p medtronic ICD, HLD, apical mural thrombus, CKD, subdural hematoma.   Clinical Impression   Pt ind at baseline with ADLs/functional mobility, was living by himself PTA. Pt currently min -max A for ADLs, mod +2 for bed mobility and mod-max A +2 for standing from EOB. Pt able to perform seated grooming tasks and stand x3 min for bed linen change and placement of sacral foam. Pt able to verbalize sternal precautions with min cues. Pt presenting with impairments listed below, will follow acutely. Patient will benefit from intensive inpatient follow up therapy, >3 hours/day to maximize safety/ind with ADLs/functional mobility  PF 3.6 PS 5400 PI 5.8 PP 3.8 No alarming of system noted throughout session.       If plan is discharge home, recommend the following: Two people to help with walking and/or transfers;A lot of help with bathing/dressing/bathroom;Assistance with cooking/housework;Assist for transportation;Help with stairs or ramp for entrance    Functional Status Assessment  Patient has had a recent decline in their functional status and demonstrates the ability to make significant improvements in function in a reasonable and predictable amount of time.  Equipment Recommendations  Other (comment) (defer)    Recommendations for Other Services PT consult;Rehab consult     Precautions / Restrictions Precautions Precautions: Sternal;Other (comment) (LVAD) Precaution Comments: swan, chest tubes Restrictions Weight Bearing Restrictions: Yes Other Position/Activity Restrictions: sternal prec      Mobility Bed Mobility Overal bed mobility:  Needs Assistance       Supine to sit: Mod assist, +2 for physical assistance Sit to supine: Mod assist, +2 for physical assistance   General bed mobility comments: mod A +2 for physical assist and multiple lines    Transfers Overall transfer level: Needs assistance Equipment used: 2 person hand held assist Transfers: Sit to/from Stand Sit to Stand: Mod assist, Max assist, +2 physical assistance, +2 safety/equipment           General transfer comment: stood x3 min for pad change and placement of sacral foam on backside      Balance Overall balance assessment: Needs assistance Sitting-balance support: No upper extremity supported, Feet supported Sitting balance-Leahy Scale: Good Sitting balance - Comments: no external support seated EOB   Standing balance support: During functional activity, Reliant on assistive device for balance Standing balance-Leahy Scale: Poor Standing balance comment: reliant on external support                           ADL either performed or assessed with clinical judgement   ADL Overall ADL's : Needs assistance/impaired Eating/Feeding: Minimal assistance   Grooming: Minimal assistance;Wash/dry face   Upper Body Bathing: Moderate assistance   Lower Body Bathing: Maximal assistance   Upper Body Dressing : Moderate assistance   Lower Body Dressing: Maximal assistance   Toilet Transfer: Moderate assistance   Toileting- Clothing Manipulation and Hygiene: Maximal assistance       Functional mobility during ADLs: Moderate assistance;Maximal assistance;+2 for physical assistance;+2 for safety/equipment       Vision         Perception Perception: Not tested  Praxis Praxis: Not tested       Pertinent Vitals/Pain Pain Assessment Pain Assessment: Faces Pain Score: 2  Faces Pain Scale: Hurts a little bit Pain Location: back, generalized with movement Pain Descriptors / Indicators: Discomfort Pain Intervention(s):  Limited activity within patient's tolerance, Monitored during session, Repositioned     Extremity/Trunk Assessment Upper Extremity Assessment Upper Extremity Assessment: Generalized weakness (within limits of sternal precautions)   Lower Extremity Assessment Lower Extremity Assessment: Defer to PT evaluation   Cervical / Trunk Assessment Cervical / Trunk Assessment: Normal   Communication Communication Communication: No apparent difficulties   Cognition Arousal: Alert Behavior During Therapy: WFL for tasks assessed/performed Overall Cognitive Status: Within Functional Limits for tasks assessed                                       General Comments  VSS, see note for LVAD metrics    Exercises     Shoulder Instructions      Home Living Family/patient expects to be discharged to:: Private residence Living Arrangements: Alone Available Help at Discharge: Other (Comment) Type of Home: House Home Access: Level entry     Home Layout: Two level;Bed/bath upstairs Alternate Level Stairs-Number of Steps: flight Alternate Level Stairs-Rails: Right Bathroom Shower/Tub: Chief Strategy Officer: Standard     Home Equipment: None          Prior Functioning/Environment Prior Level of Function : Independent/Modified Independent;Driving             Mobility Comments: no AD use ADLs Comments: ind        OT Problem List: Decreased strength;Decreased range of motion;Decreased activity tolerance;Impaired balance (sitting and/or standing);Decreased safety awareness;Cardiopulmonary status limiting activity;Decreased knowledge of precautions      OT Treatment/Interventions: Self-care/ADL training;Therapeutic exercise;DME and/or AE instruction;Energy conservation;Therapeutic activities;Patient/family education;Balance training    OT Goals(Current goals can be found in the care plan section) Acute Rehab OT Goals Patient Stated Goal: none stated OT  Goal Formulation: With patient Time For Goal Achievement: 03/24/23 Potential to Achieve Goals: Good ADL Goals Pt Will Perform Upper Body Dressing: with contact guard assist;sitting Pt Will Perform Lower Body Dressing: with min assist;sit to/from stand;sitting/lateral leans Pt Will Transfer to Toilet: with min assist;ambulating;regular height toilet Additional ADL Goal #1: pt will perform bed mobility with supervision in prep for ADLs  OT Frequency: Min 1X/week    Co-evaluation PT/OT/SLP Co-Evaluation/Treatment: Yes Reason for Co-Treatment: To address functional/ADL transfers;Complexity of the patient's impairments (multi-system involvement);For patient/therapist safety PT goals addressed during session: Mobility/safety with mobility OT goals addressed during session: ADL's and self-care      AM-PAC OT "6 Clicks" Daily Activity     Outcome Measure Help from another person eating meals?: A Little Help from another person taking care of personal grooming?: A Little Help from another person toileting, which includes using toliet, bedpan, or urinal?: A Lot Help from another person bathing (including washing, rinsing, drying)?: A Lot Help from another person to put on and taking off regular upper body clothing?: A Lot Help from another person to put on and taking off regular lower body clothing?: A Lot 6 Click Score: 14   End of Session Equipment Utilized During Treatment: Oxygen Nurse Communication: Mobility status  Activity Tolerance: Patient tolerated treatment well Patient left: in bed;with call bell/phone within reach;with bed alarm set;with nursing/sitter in room  OT Visit Diagnosis: Unsteadiness on feet (  R26.81);Other abnormalities of gait and mobility (R26.89);Muscle weakness (generalized) (M62.81)                Time: 8469-6295 OT Time Calculation (min): 38 min Charges:  OT General Charges $OT Visit: 1 Visit OT Evaluation $OT Eval Moderate Complexity: 1 Mod OT  Treatments $Self Care/Home Management : 8-22 mins   Carver Fila, OTD, OTR/L SecureChat Preferred Acute Rehab [phone]   Carver Fila Koonce 03/10/2023, 2:05 PM

## 2023-03-10 NOTE — Progress Notes (Signed)
Patient ID: Steven [name], male   DOB: [birth year], 67 y.o.   MRN: [id number] HeartMate 3 Rounding Note  Subjective:    Had a fairly stable night.  Says he did not sleep much.  On Milrinone 0.125. Epi off due to HTN.  CI 2.59, Co-ox 57.1  + 890 cc yesterday. Wt up 2 lbs yesterday. 16 lbs over preop.  LVAD INTERROGATION:  HeartMate IIl LVAD:  Flow 3.5 liters/min, speed 5400, power 4, PI 5.8.  Few PI events.  Objective:    Vital Signs:   Temp:  [96.6 F (35.9 C)-101.1 F (38.4 C)] 99.7 F (37.6 C) (09/14 0745) Pulse Rate:  [30-215] 83 (09/14 0745) Resp:  [12-39] 27 (09/14 0745) BP: (100-101)/(78-85) 100/85 (09/14 0000) SpO2:  [89 %-100 %] 97 % (09/14 0745) Arterial Line BP: (72-112)/(51-83) 103/77 (09/14 0745) FiO2 (%):  [40 %] 40 % (09/13 1014) Weight:  [77.9 kg] 77.9 kg (09/14 0700) Last BM Date : 03/07/23 Mean arterial Pressure 100-108  Intake/Output:   Intake/Output Summary (Last 24 hours) at 03/10/2023 0826 Last data filed at 03/10/2023 0800 Gross per 24 hour  Intake 2158.3 ml  Output 1269 ml  Net 889.3 ml     Physical Exam: General:  Well appearing. No resp difficulty HEENT: normal Cor: distant heart sounds with LVAD hum present. Lungs: clear Abdomen: soft, nontender, nondistended. Good bowel sounds. Driveline dressing intact without drainage. Anchor in place. Extremities: moderate edema Neuro: alert & orientedx3, cranial nerves grossly intact. moves all 4 extremities w/o difficulty. Affect pleasant  Telemetry: sinus 80's  Labs: Basic Metabolic Panel: Recent Labs  Lab 03/08/23 1336 03/08/23 1357 03/08/23 2015 03/08/23 2024 03/09/23 0429 03/09/23 1036 03/09/23 1205 03/09/23 1610 03/09/23 1614 03/10/23 0451 03/10/23 0459  NA 136   < > 136   < > 138   < > 137 134* 134* 133* 135  K 3.6   < > 4.6   < > 4.8   < > 4.3 4.3 4.3 4.4 4.6  CL 103  --  103  --  103  --   --  103  --  102  --   CO2 25  --  23  --  22  --   --  19*  --  21*  --   GLUCOSE 70  --   125*  --  92  --   --  124*  --  152*  --   BUN 21  --  19  --  20  --   --  19  --  21  --   CREATININE 1.21  --  1.26*  --  1.28*  --   --  1.43*  --  1.50*  --   CALCIUM 8.3*  --  8.1*  --  8.4*  --   --  8.4*  --  8.5*  --   MG 1.7  --  2.7*  --  2.4  --   --  2.4  --  2.5*  --   PHOS  --   --   --   --  4.6  --   --   --   --  3.4  --    < > = values in this interval not displayed.    Liver Function Tests: Recent Labs  Lab 03/07/23 1500 03/09/23 0429 03/10/23 0451  AST 35 54* 76*  ALT 30 18 21   ALKPHOS 69 37* 45  BILITOT 0.9 4.2* 5.9*  PROT 7.4 5.5*  6.2*  ALBUMIN 3.4* 3.3* 3.5   No results for input(s): "LIPASE", "AMYLASE" in the last 168 hours. No results for input(s): "AMMONIA" in the last 168 hours.  CBC: Recent Labs  Lab 03/08/23 1336 03/08/23 1357 03/08/23 2015 03/08/23 2024 03/09/23 0429 03/09/23 1036 03/09/23 1205 03/09/23 1610 03/09/23 1614 03/10/23 0451 03/10/23 0459  WBC 14.5*  --  13.1*  --  12.1*  --   --  14.3*  --  17.4*  --   NEUTROABS  --   --   --   --  9.1*  --   --   --   --  15.0*  --   HGB 8.1*   < > 8.2*   < > 9.4*   < > 8.5* 9.1* 9.2* 8.5* 8.8*  HCT 24.6*   < > 24.7*   < > 27.3*   < > 25.0* 27.1* 27.0* 25.6* 26.0*  MCV 93.9  --  92.5  --  91.9  --   --  90.9  --  91.4  --   PLT 85*  87*  --  108*  --  106*  --   --  115*  --  121*  --    < > = values in this interval not displayed.    INR: Recent Labs  Lab 03/08/23 0408 03/08/23 1336 03/08/23 2015 03/09/23 0429 03/10/23 0451  INR 1.3* 1.6* 1.5* 1.6* 2.1*    Other results: EKG:   Imaging: DG Chest Port 1 View  Result Date: 03/09/2023 CLINICAL DATA:  Left ventricular assist device. EXAM: PORTABLE CHEST 1 VIEW COMPARISON:  March 08, 2023. FINDINGS: Endotracheal and nasogastric tube are unchanged. Right internal jugular Swan-Ganz catheter is unchanged. Bilateral chest tubes are noted without pneumothorax. Left ventricular assist device is unchanged. Left-sided defibrillator  is again noted. Minimal bibasilar subsegmental atelectasis is noted. IMPRESSION: Stable support apparatus as noted above. Minimal bibasilar subsegmental atelectasis. Electronically Signed   By: Lupita Raider M.D.   On: 03/09/2023 10:35   DG Chest Port 1 View  Result Date: 03/08/2023 CLINICAL DATA:  [id number] LVAD (left ventricular assist device) present (HCC) [id number]. EXAM: PORTABLE CHEST 1 VIEW COMPARISON:  03/07/2023. FINDINGS: Interval median sternotomy and placement of a left ventricular assist device. Endotracheal tube tip projects over the midthoracic trachea. Unchanged Left chest AICD/pacemaker with leads projecting over the right atrium and ventricle. Unchanged right IJ approach pulmonary arterial catheter with tip projecting over the right main pulmonary artery. Pleural mediastinal chest tubes are in place. Enteric tube side port projects over the lower esophagus. Interval advancement is recommended. Left basilar atelectasis. No consolidation or pulmonary edema. No pleural effusion or pneumothorax. IMPRESSION: 1. Interval median sternotomy and placement of a left ventricular assist device. 2. Enteric tube side port projects over the lower esophagus. Interval advancement is recommended. Electronically Signed   By: Orvan Falconer M.D.   On: 03/08/2023 15:31   EP STUDY  Result Date: 03/08/2023 See surgical note for result.    Medications:     Scheduled Medications:  acetaminophen  1,000 mg Oral Q6H   Or   acetaminophen (TYLENOL) oral liquid 160 mg/5 mL  1,000 mg Per Tube Q6H   amiodarone  200 mg Oral Daily   aspirin EC  325 mg Oral Daily   Or   aspirin  324 mg Per Tube Daily   Or   aspirin  300 mg Rectal Daily   atorvastatin  80 mg Oral Daily   bisacodyl  10  mg Oral Daily   Or   bisacodyl  10 mg Rectal Daily   Chlorhexidine Gluconate Cloth  6 each Topical Daily   docusate sodium  200 mg Oral Daily   feeding supplement  237 mL Oral TID BM   furosemide  60 mg Intravenous BID    insulin aspart  0-24 Units Subcutaneous Q4H   metoCLOPramide (REGLAN) injection  10 mg Intravenous Q6H   multivitamin with minerals  1 tablet Oral Daily   pantoprazole  40 mg Oral Daily   potassium chloride  40 mEq Oral Once   sodium chloride flush  3 mL Intravenous Q12H   tamsulosin  0.4 mg Oral QPC supper    Infusions:  sodium chloride 20 mL/hr at 03/10/23 0800   sodium chloride     sodium chloride Stopped (03/09/23 1448)   epinephrine Stopped (03/10/23 0452)   lactated ringers     lactated ringers     lactated ringers Stopped (03/10/23 0729)   milrinone 0.125 mcg/kg/min (03/10/23 0800)   norepinephrine (LEVOPHED) Adult infusion Stopped (03/09/23 0044)   vancomycin Stopped (03/09/23 1049)   vasopressin Stopped (03/09/23 1843)    PRN Medications: sodium chloride, dextrose, hydrALAZINE, lactated ringers, midazolam, morphine injection, ondansetron (ZOFRAN) IV, mouth rinse, oxyCODONE, sodium chloride flush, traMADol  CXR ok this am.  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 Preop EF less than 20% with dilated LV to 7 cm. NYHA class IV CHF.   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   CAD: h/o large anterior MI 2013 treated w/ DES to LAD    H/O VT s/p Medtronic ICD. Now turned off for surgery.   Plan/Discussion:    Start diuresis today since CVP up.   Hydralazine prn for HTN. VAD flow decreased with HTN.  Keep chest tubes today.  INR 2.1 so will hold off on Coumadin and assess in am.  IS.   I reviewed the LVAD parameters from today, and compared the results to the patient's prior recorded data.  No programming changes were made.  The LVAD is functioning within specified parameters.    Length of Stay: [address]  Alleen Borne 03/10/2023, 8:26 AM

## 2023-03-10 NOTE — Plan of Care (Signed)
Cardiology plan of care  Called with concern for ventricular arrhythmia. Reviewed tele. Appears to be AF with underlying BBB. I did not see sustained VT. VAD with no low flows. Patient is on IV amio and has received a bolus today. ECG attempted to obtain but wasn't working correctly. I did see a 12 lead but it did not transfer to EHR. Device appears to be working well despite inappropriate pacing spikes on tele - of note I did not see undersensing over pacing on ECG. Will review mag level and continue current medical management.

## 2023-03-10 NOTE — Progress Notes (Signed)
Drive Line: Existing VAD dressing removed and site care performed using sterile technique. Drive line exit site cleaned with Chlora prep applicators x 2, allowed to dry, and gauze dressing with Silver strip applied. There is a single suture present. Exit site unincorporated with the velour remaining  exposed approx 1" at exit site. Small amount of bloody drainage. No redness, tenderness, foul odor or rash noted. Drive line anchor re-applied. Next dressing due tomorrow 03/11/23 by nurse champion or VAD coordinator.

## 2023-03-10 NOTE — Progress Notes (Signed)
Physical Therapy Treatment Patient Details Name: Steven [name] MRN: [id number] DOB: [birth year] Today's Date: 03/10/2023   History of Present Illness Pt is 67 yo male admitted on 02/26/23 for acute on chronic systolic and diastolic CHF with low output.  S/P implantation of LVAD Heartmate 3  (9/12). PMHx includes but not limited to ICM, CAD, VT s/p medtronic ICD, HLD, apical mural thrombus, CKD, subdural hematoma.    PT Comments  Improved over the evaluation yesterday.  Emphasis on warm up, sternal precaution education, transition to EOB, coming into midline and holding in midline without assist for 5+ min and sit to stand at EOB for ~ 3 min for bed change, before return to supine.     If plan is discharge home, recommend the following: A little help with walking and/or transfers;A little help with bathing/dressing/bathroom   Can travel by private vehicle        Equipment Recommendations  Other (comment) (TBD)    Recommendations for Other Services       Precautions / Restrictions Precautions Precautions: Sternal;Other (comment) (LVAD) Precaution Comments: swan, chest tubes Restrictions Weight Bearing Restrictions: Yes Other Position/Activity Restrictions: sternal prec     Mobility  Bed Mobility Overal bed mobility: Needs Assistance       Supine to sit: Mod assist, +2 for physical assistance Sit to supine: Mod assist, +2 for physical assistance   General bed mobility comments: mod A +2 for physical assist and multiple lines    Transfers Overall transfer level: Needs assistance Equipment used: 2 person hand held assist Transfers: Sit to/from Stand Sit to Stand: Mod assist, Max assist, +2 physical assistance, +2 safety/equipment           General transfer comment: stood x3 min for pad change and placement of sacral foam on backside    Ambulation/Gait               General Gait Details: NT   Stairs             Wheelchair Mobility     Tilt Bed     Modified Rankin (Stroke Patients Only)       Balance Overall balance assessment: Needs assistance Sitting-balance support: No upper extremity supported, Feet supported Sitting balance-Leahy Scale: Good Sitting balance - Comments: no external support seated EOB   Standing balance support: During functional activity, Reliant on assistive device for balance Standing balance-Leahy Scale: Poor Standing balance comment: reliant on external support                            Cognition Arousal: Alert Behavior During Therapy: WFL for tasks assessed/performed Overall Cognitive Status: Within Functional Limits for tasks assessed                                          Exercises Other Exercises Other Exercises: hip/knee flex/ext ROM with graded resistance x 10 reps bil    General Comments General comments (skin integrity, edema, etc.): VSS see note for LVAD metrics\      Pertinent Vitals/Pain Pain Assessment Pain Assessment: Faces Faces Pain Scale: Hurts a little bit Pain Location: back, generalized with movement Pain Descriptors / Indicators: Discomfort Pain Intervention(s): Limited activity within patient's tolerance, Monitored during session    Home Living Family/patient expects to be discharged to:: Private residence Living Arrangements: Alone Available Help at Discharge:  Other (Comment) Type of Home: House Home Access: Level entry     Alternate Level Stairs-Number of Steps: flight Home Layout: Two level;Bed/bath upstairs Home Equipment: None      Prior Function            PT Goals (current goals can now be found in the care plan section) Acute Rehab PT Goals PT Goal Formulation: With patient Time For Goal Achievement: 03/23/23 Potential to Achieve Goals: Good Progress towards PT goals: Progressing toward goals    Frequency    Min 1X/week      PT Plan      Co-evaluation PT/OT/SLP Co-Evaluation/Treatment: Yes Reason  for Co-Treatment: To address functional/ADL transfers;Complexity of the patient's impairments (multi-system involvement);For patient/therapist safety PT goals addressed during session: Mobility/safety with mobility OT goals addressed during session: ADL's and self-care      AM-PAC PT "6 Clicks" Mobility   Outcome Measure  Help needed turning from your back to your side while in a flat bed without using bedrails?: A Lot Help needed moving from lying on your back to sitting on the side of a flat bed without using bedrails?: A Lot Help needed moving to and from a bed to a chair (including a wheelchair)?: Total Help needed standing up from a chair using your arms (e.g., wheelchair or bedside chair)?: Total Help needed to walk in hospital room?: Total Help needed climbing 3-5 steps with a railing? : Total 6 Click Score: 8    End of Session Equipment Utilized During Treatment: Oxygen Activity Tolerance: Patient tolerated treatment well Patient left: in bed;with call bell/phone within reach Nurse Communication: Mobility status PT Visit Diagnosis: Other abnormalities of gait and mobility (R26.89);Other (comment)     Time: 1610-9604 PT Time Calculation (min) (ACUTE ONLY): 35 min  Charges:    $Therapeutic Activity: 8-22 mins                       03/10/2023  Jacinto Halim., PT Acute Rehabilitation Services [phone]  (office)   Eliseo Gum Demetri Goshert 03/10/2023, 2:08 PM

## 2023-03-10 NOTE — Progress Notes (Addendum)
Patient ID: Steven [name], male   DOB: 1955-09-26, 67 y.o.   MRN: [id number]   Advanced Heart Failure VAD Team Note  PCP-Cardiologist: Dr. Gala Romney   Subjective:    9/12: HM3 LVAD Implant. Intra-op TEE LVEF 15%, RV normal  9/13: Extubated.  Driveline pulled back, 1 inch of Velour exposed.   This morning, he is off epinephrine and vasopressin and remains on milrinone 0.125. Creatinine stable 1.5.   Swan #s  CO-OX 57% CVP 21 PA 42/19 CI 2.59 PAPi 1.09  Tbili 5.9  Awake, answers questions.   LVAD INTERROGATION:  HeartMate III LVAD:   Flow 3.4 liters/min, speed 5400, power 3.7, PI 7.    Objective:    Vital Signs:   Temp:  [96.6 F (35.9 C)-101.1 F (38.4 C)] 99.7 F (37.6 C) (09/14 0745) Pulse Rate:  [30-215] 83 (09/14 0745) Resp:  [12-39] 27 (09/14 0745) BP: (100-101)/(78-85) 100/85 (09/14 0000) SpO2:  [89 %-100 %] 97 % (09/14 0745) Arterial Line BP: (72-112)/(51-83) 103/77 (09/14 0745) FiO2 (%):  [40 %] 40 % (09/13 1014) Weight:  [77.9 kg] 77.9 kg (09/14 0700) Last BM Date : 03/07/23 Mean arterial Pressure  80s  Intake/Output:   Intake/Output Summary (Last 24 hours) at 03/10/2023 0865 Last data filed at 03/10/2023 0800 Gross per 24 hour  Intake 2158.3 ml  Output 1269 ml  Net 889.3 ml     Physical Exam    General: Well appearing this am. NAD.  HEENT: Normal. Neck: Supple, JVP 16+ cm. Carotids OK.  Cardiac:  Mechanical heart sounds with LVAD hum present.  Lungs:  CTAB, normal effort.  Abdomen:  NT, ND, no HSM. No bruits or masses. +BS  LVAD exit site: Well-healed and incorporated. Dressing dry and intact. No erythema or drainage. Stabilization device present and accurately applied. Driveline dressing changed daily per sterile technique. Extremities:  Warm and dry. No cyanosis, clubbing, rash, or edema.  Neuro:  Alert & oriented x 3. Cranial nerves grossly intact. Moves all 4 extremities w/o difficulty. Affect pleasant    Telemetry   NSR 80s, personally  reviewed  EKG    No new EKG to review   Labs   Basic Metabolic Panel: Recent Labs  Lab 03/08/23 1336 03/08/23 1357 03/08/23 2015 03/08/23 2024 03/09/23 0429 03/09/23 1036 03/09/23 1205 03/09/23 1610 03/09/23 1614 03/10/23 0451 03/10/23 0459  NA 136   < > 136   < > 138   < > 137 134* 134* 133* 135  K 3.6   < > 4.6   < > 4.8   < > 4.3 4.3 4.3 4.4 4.6  CL 103  --  103  --  103  --   --  103  --  102  --   CO2 25  --  23  --  22  --   --  19*  --  21*  --   GLUCOSE 70  --  125*  --  92  --   --  124*  --  152*  --   BUN 21  --  19  --  20  --   --  19  --  21  --   CREATININE 1.21  --  1.26*  --  1.28*  --   --  1.43*  --  1.50*  --   CALCIUM 8.3*  --  8.1*  --  8.4*  --   --  8.4*  --  8.5*  --   MG 1.7  --  2.7*  --  2.4  --   --  2.4  --  2.5*  --   PHOS  --   --   --   --  4.6  --   --   --   --  3.4  --    < > = values in this interval not displayed.    Liver Function Tests: Recent Labs  Lab 03/07/23 1500 03/09/23 0429 03/10/23 0451  AST 35 54* 76*  ALT 30 18 21   ALKPHOS 69 37* 45  BILITOT 0.9 4.2* 5.9*  PROT 7.4 5.5* 6.2*  ALBUMIN 3.4* 3.3* 3.5   No results for input(s): "LIPASE", "AMYLASE" in the last 168 hours. No results for input(s): "AMMONIA" in the last 168 hours.  CBC: Recent Labs  Lab 03/08/23 1336 03/08/23 1357 03/08/23 2015 03/08/23 2024 03/09/23 0429 03/09/23 1036 03/09/23 1205 03/09/23 1610 03/09/23 1614 03/10/23 0451 03/10/23 0459  WBC 14.5*  --  13.1*  --  12.1*  --   --  14.3*  --  17.4*  --   NEUTROABS  --   --   --   --  9.1*  --   --   --   --  15.0*  --   HGB 8.1*   < > 8.2*   < > 9.4*   < > 8.5* 9.1* 9.2* 8.5* 8.8*  HCT 24.6*   < > 24.7*   < > 27.3*   < > 25.0* 27.1* 27.0* 25.6* 26.0*  MCV 93.9  --  92.5  --  91.9  --   --  90.9  --  91.4  --   PLT 85*  87*  --  108*  --  106*  --   --  115*  --  121*  --    < > = values in this interval not displayed.    INR: Recent Labs  Lab 03/08/23 0408 03/08/23 1336  03/08/23 2015 03/09/23 0429 03/10/23 0451  INR 1.3* 1.6* 1.5* 1.6* 2.1*    Other results: EKG:    Imaging   DG Chest Port 1 View  Result Date: 03/09/2023 CLINICAL DATA:  Left ventricular assist device. EXAM: PORTABLE CHEST 1 VIEW COMPARISON:  March 08, 2023. FINDINGS: Endotracheal and nasogastric tube are unchanged. Right internal jugular Swan-Ganz catheter is unchanged. Bilateral chest tubes are noted without pneumothorax. Left ventricular assist device is unchanged. Left-sided defibrillator is again noted. Minimal bibasilar subsegmental atelectasis is noted. IMPRESSION: Stable support apparatus as noted above. Minimal bibasilar subsegmental atelectasis. Electronically Signed   By: Lupita Raider M.D.   On: 03/09/2023 10:35   DG Chest Port 1 View  Result Date: 03/08/2023 CLINICAL DATA:  [id number] LVAD (left ventricular assist device) present (HCC) [id number]. EXAM: PORTABLE CHEST 1 VIEW COMPARISON:  03/07/2023. FINDINGS: Interval median sternotomy and placement of a left ventricular assist device. Endotracheal tube tip projects over the midthoracic trachea. Unchanged Left chest AICD/pacemaker with leads projecting over the right atrium and ventricle. Unchanged right IJ approach pulmonary arterial catheter with tip projecting over the right main pulmonary artery. Pleural mediastinal chest tubes are in place. Enteric tube side port projects over the lower esophagus. Interval advancement is recommended. Left basilar atelectasis. No consolidation or pulmonary edema. No pleural effusion or pneumothorax. IMPRESSION: 1. Interval median sternotomy and placement of a left ventricular assist device. 2. Enteric tube side port projects over the lower esophagus. Interval advancement is recommended. Electronically Signed   By: Elwyn Reach.D.  On: 03/08/2023 15:31   EP STUDY  Result Date: 03/08/2023 See surgical note for result.    Medications:     Scheduled Medications:  acetaminophen  1,000 mg  Oral Q6H   Or   acetaminophen (TYLENOL) oral liquid 160 mg/5 mL  1,000 mg Per Tube Q6H   amiodarone  200 mg Oral Daily   aspirin EC  325 mg Oral Daily   Or   aspirin  324 mg Per Tube Daily   Or   aspirin  300 mg Rectal Daily   atorvastatin  80 mg Oral Daily   bisacodyl  10 mg Oral Daily   Or   bisacodyl  10 mg Rectal Daily   Chlorhexidine Gluconate Cloth  6 each Topical Daily   docusate sodium  200 mg Oral Daily   feeding supplement  237 mL Oral TID BM   insulin aspart  0-24 Units Subcutaneous Q4H   metoCLOPramide (REGLAN) injection  10 mg Intravenous Q6H   multivitamin with minerals  1 tablet Oral Daily   pantoprazole  40 mg Oral Daily   sodium chloride flush  3 mL Intravenous Q12H   tamsulosin  0.4 mg Oral QPC supper    Infusions:  sodium chloride 20 mL/hr at 03/10/23 0800   sodium chloride     sodium chloride Stopped (03/09/23 1448)   epinephrine Stopped (03/10/23 0452)   lactated ringers     lactated ringers     lactated ringers Stopped (03/10/23 0729)   milrinone 0.125 mcg/kg/min (03/10/23 0800)   norepinephrine (LEVOPHED) Adult infusion Stopped (03/09/23 0044)   vancomycin Stopped (03/09/23 1049)   vasopressin Stopped (03/09/23 1843)    PRN Medications: sodium chloride, dextrose, lactated ringers, midazolam, morphine injection, ondansetron (ZOFRAN) IV, mouth rinse, oxyCODONE, sodium chloride flush, traMADol   Patient Profile   Steven [name] is a 67 y.o. 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    Admitted with acute on chronic systolic and diastolic CHF with low-output. Advanced Heart Failure consulted and workup started for advanced therapies. S/p HM3 LVAD 9/12.   Assessment/Plan:    1. Acute on chronic combined systolic and diastolic heart failure>>Low Output S/p HM3 LVAD on 9/12.  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 NYHA IV on admission.  Echo 02/27/23: EF <20%, LV with GHK, RV mildly reduced, GIIDD, LA mod dilated, mild MR.  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 GDMT limited by renal function and hypotension.  S/p HM3 LVAD implant 9/12.  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 Intra-op TEE LVEF 15%, RV normal.  Currently on milrinone 0.125 with MAP 90s-100s.  Speed increased yesterday to 5400.  CVP rising, up to 21 today with PAPi marginal at 1.09. CI 2.59 with co-ox 57%. LVAD parameters stable, no low flows.  - Continue milrinone 0.125 - Will start diuresing, Lasix 60 mg IV bid.   - MAP may drop with diuresis, will also add prn hydralazine.  Can give scheduled hydralazine if needed.  - Still with output from CTs, keeping today - INR 2.1 today, discussed with Dr. Laneta Simmers and will start warfarin tomorrow.  - Continue ASA 325.  2. 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  - on statin  - on ASA  3. AKI on CKD stage 3: Creatinine  stable today at 1.5.  4. Mural thrombus: Warfarin to restart tomorrow.  5. H/o VT s/p Medtronic ICD: likely scar mediated - Continue amiodarone 200 mg daily 6. Anemia: Post-op.  - Transfuse hgb < 8.   Mobilize  I reviewed the LVAD parameters from today, and compared the results to the patient's prior recorded data.  No programming changes were made.  The LVAD is functioning within specified parameters.  The patient performs LVAD self-test daily.  LVAD interrogation was negative for any significant power changes, alarms or PI events/speed drops.  LVAD equipment check completed and is in good working order.  Back-up equipment present.   LVAD education done on emergency procedures and precautions and reviewed exit site care.  CRITICAL CARE Performed by: Marca Ancona  Total critical care time: 40 minutes  Critical care time was exclusive of separately billable procedures and treating other  patients.  Critical care was necessary to treat or prevent imminent or life-threatening deterioration.  Critical care was time spent personally by me on the following activities: development of treatment plan with patient and/or surrogate as well as nursing, discussions with consultants, evaluation of patient's response to treatment, examination of patient, obtaining history from patient or surrogate, ordering and performing treatments and interventions, ordering and review of laboratory studies, ordering and review of radiographic studies, pulse oximetry and re-evaluation of patient's condition.  Length of Stay: 63  Marca Ancona, MD 03/10/2023, 8:22 AM  VAD Team --- VAD ISSUES ONLY--- Pager [phone] (7am - 7am)  Advanced Heart Failure Team  Pager [phone] (M-F; 7a - 5p)  Please contact [facility] for night-coverage after hours (5p -7a ) and weekends on [email or web address]

## 2023-03-11 ENCOUNTER — Inpatient Hospital Stay (HOSPITAL_COMMUNITY): Payer: 59

## 2023-03-11 ENCOUNTER — Other Ambulatory Visit: Payer: Self-pay

## 2023-03-11 DIAGNOSIS — I5023 Acute on chronic systolic (congestive) heart failure: Secondary | ICD-10-CM | POA: Diagnosis not present

## 2023-03-11 LAB — CBC WITH DIFFERENTIAL/PLATELET
Abs Immature Granulocytes: 0.22 10*3/uL — ABNORMAL HIGH (ref 0.00–0.07)
Basophils Absolute: 0 10*3/uL (ref 0.0–0.1)
Basophils Relative: 0 %
Eosinophils Absolute: 0 10*3/uL (ref 0.0–0.5)
Eosinophils Relative: 0 %
HCT: 25 % — ABNORMAL LOW (ref 39.0–52.0)
Hemoglobin: 8.5 g/dL — ABNORMAL LOW (ref 13.0–17.0)
Immature Granulocytes: 1 %
Lymphocytes Relative: 4 %
Lymphs Abs: 0.6 10*3/uL — ABNORMAL LOW (ref 0.7–4.0)
MCH: 30.8 pg (ref 26.0–34.0)
MCHC: 34 g/dL (ref 30.0–36.0)
MCV: 90.6 fL (ref 80.0–100.0)
Monocytes Absolute: 1.4 10*3/uL — ABNORMAL HIGH (ref 0.1–1.0)
Monocytes Relative: 8 %
Neutro Abs: 16.2 10*3/uL — ABNORMAL HIGH (ref 1.7–7.7)
Neutrophils Relative %: 87 %
Platelets: 125 10*3/uL — ABNORMAL LOW (ref 150–400)
RBC: 2.76 MIL/uL — ABNORMAL LOW (ref 4.22–5.81)
RDW: 15.9 % — ABNORMAL HIGH (ref 11.5–15.5)
WBC: 18.5 10*3/uL — ABNORMAL HIGH (ref 4.0–10.5)
nRBC: 0 % (ref 0.0–0.2)

## 2023-03-11 LAB — COOXEMETRY PANEL
Carboxyhemoglobin: 1.8 % — ABNORMAL HIGH (ref 0.5–1.5)
Methemoglobin: 0.8 % (ref 0.0–1.5)
O2 Saturation: 79.9 %
Total hemoglobin: 9 g/dL — ABNORMAL LOW (ref 12.0–16.0)

## 2023-03-11 LAB — TYPE AND SCREEN
ABO/RH(D): AB POS
Antibody Screen: NEGATIVE
Unit division: 0
Unit division: 0
Unit division: 0

## 2023-03-11 LAB — COMPREHENSIVE METABOLIC PANEL
ALT: 21 U/L (ref 0–44)
AST: 66 U/L — ABNORMAL HIGH (ref 15–41)
Albumin: 3.2 g/dL — ABNORMAL LOW (ref 3.5–5.0)
Alkaline Phosphatase: 53 U/L (ref 38–126)
Anion gap: 10 (ref 5–15)
BUN: 26 mg/dL — ABNORMAL HIGH (ref 8–23)
CO2: 21 mmol/L — ABNORMAL LOW (ref 22–32)
Calcium: 8.5 mg/dL — ABNORMAL LOW (ref 8.9–10.3)
Chloride: 100 mmol/L (ref 98–111)
Creatinine, Ser: 1.58 mg/dL — ABNORMAL HIGH (ref 0.61–1.24)
GFR, Estimated: 47 mL/min — ABNORMAL LOW (ref >60)
Glucose, Bld: 179 mg/dL — ABNORMAL HIGH (ref 70–99)
Potassium: 4 mmol/L (ref 3.5–5.1)
Sodium: 131 mmol/L — ABNORMAL LOW (ref 135–145)
Total Bilirubin: 3.8 mg/dL — ABNORMAL HIGH (ref 0.3–1.2)
Total Protein: 6.3 g/dL — ABNORMAL LOW (ref 6.5–8.1)

## 2023-03-11 LAB — CBC
HCT: 25.1 % — ABNORMAL LOW (ref 39.0–52.0)
Hemoglobin: 8.3 g/dL — ABNORMAL LOW (ref 13.0–17.0)
MCH: 31.4 pg (ref 26.0–34.0)
MCHC: 33.1 g/dL (ref 30.0–36.0)
MCV: 95.1 fL (ref 80.0–100.0)
Platelets: 136 10*3/uL — ABNORMAL LOW (ref 150–400)
RBC: 2.64 MIL/uL — ABNORMAL LOW (ref 4.22–5.81)
RDW: 16 % — ABNORMAL HIGH (ref 11.5–15.5)
WBC: 16.9 10*3/uL — ABNORMAL HIGH (ref 4.0–10.5)
nRBC: 0.1 % (ref 0.0–0.2)

## 2023-03-11 LAB — GLUCOSE, CAPILLARY
Glucose-Capillary: 118 mg/dL — ABNORMAL HIGH (ref 70–99)
Glucose-Capillary: 123 mg/dL — ABNORMAL HIGH (ref 70–99)
Glucose-Capillary: 130 mg/dL — ABNORMAL HIGH (ref 70–99)
Glucose-Capillary: 137 mg/dL — ABNORMAL HIGH (ref 70–99)
Glucose-Capillary: 141 mg/dL — ABNORMAL HIGH (ref 70–99)
Glucose-Capillary: 146 mg/dL — ABNORMAL HIGH (ref 70–99)

## 2023-03-11 LAB — BASIC METABOLIC PANEL
Anion gap: 14 (ref 5–15)
BUN: 29 mg/dL — ABNORMAL HIGH (ref 8–23)
CO2: 22 mmol/L (ref 22–32)
Calcium: 8.7 mg/dL — ABNORMAL LOW (ref 8.9–10.3)
Chloride: 96 mmol/L — ABNORMAL LOW (ref 98–111)
Creatinine, Ser: 1.68 mg/dL — ABNORMAL HIGH (ref 0.61–1.24)
GFR, Estimated: 44 mL/min — ABNORMAL LOW (ref >60)
Glucose, Bld: 162 mg/dL — ABNORMAL HIGH (ref 70–99)
Potassium: 4 mmol/L (ref 3.5–5.1)
Sodium: 132 mmol/L — ABNORMAL LOW (ref 135–145)

## 2023-03-11 LAB — BPAM RBC
Blood Product Expiration Date: 202409142359
Blood Product Expiration Date: 202409232359
Blood Product Expiration Date: 202410022359
ISSUE DATE / TIME: 202409121123
ISSUE DATE / TIME: 202409121738
ISSUE DATE / TIME: 202409130032
Unit Type and Rh: 7300
Unit Type and Rh: 8400
Unit Type and Rh: 8400

## 2023-03-11 LAB — PHOSPHORUS: Phosphorus: 2.2 mg/dL — ABNORMAL LOW (ref 2.5–4.6)

## 2023-03-11 LAB — PROTIME-INR
INR: 1.9 — ABNORMAL HIGH (ref 0.8–1.2)
Prothrombin Time: 21.8 s — ABNORMAL HIGH (ref 11.4–15.2)

## 2023-03-11 LAB — MAGNESIUM: Magnesium: 2.2 mg/dL (ref 1.7–2.4)

## 2023-03-11 LAB — LACTATE DEHYDROGENASE: LDH: 299 U/L — ABNORMAL HIGH (ref 98–192)

## 2023-03-11 MED ORDER — SODIUM CHLORIDE 0.9% FLUSH
10.0000 mL | Freq: Two times a day (BID) | INTRAVENOUS | Status: DC
  Administered 2023-03-11 – 2023-03-20 (×17): 10 mL

## 2023-03-11 MED ORDER — ASPIRIN 81 MG PO CHEW
81.0000 mg | CHEWABLE_TABLET | Freq: Every day | ORAL | Status: DC
  Filled 2023-03-11 (×2): qty 1

## 2023-03-11 MED ORDER — WARFARIN - PHYSICIAN DOSING INPATIENT: Freq: Every day | Status: DC

## 2023-03-11 MED ORDER — METOLAZONE 2.5 MG PO TABS
2.5000 mg | ORAL_TABLET | Freq: Once | ORAL | Status: AC
  Administered 2023-03-11: 2.5 mg via ORAL
  Filled 2023-03-11: qty 1

## 2023-03-11 MED ORDER — WARFARIN SODIUM 1 MG PO TABS
1.0000 mg | ORAL_TABLET | Freq: Once | ORAL | Status: AC
  Administered 2023-03-11: 1 mg via ORAL
  Filled 2023-03-11: qty 1

## 2023-03-11 MED ORDER — ASPIRIN 81 MG PO TBEC
81.0000 mg | DELAYED_RELEASE_TABLET | Freq: Every day | ORAL | Status: DC
  Administered 2023-03-11 – 2023-03-20 (×10): 81 mg via ORAL
  Filled 2023-03-11 (×10): qty 1

## 2023-03-11 MED ORDER — ASPIRIN 300 MG RE SUPP
150.0000 mg | Freq: Every day | RECTAL | Status: DC
  Filled 2023-03-11 (×4): qty 1

## 2023-03-11 MED ORDER — SODIUM CHLORIDE 0.9% FLUSH: 10.0000 mL | INTRAVENOUS | Status: DC | PRN

## 2023-03-11 MED ORDER — POTASSIUM CHLORIDE CRYS ER 20 MEQ PO TBCR
40.0000 meq | EXTENDED_RELEASE_TABLET | Freq: Once | ORAL | Status: AC
  Administered 2023-03-11: 40 meq via ORAL
  Filled 2023-03-11: qty 2

## 2023-03-11 MED ORDER — SODIUM PHOSPHATES 45 MMOLE/15ML IV SOLN
15.0000 mmol | Freq: Once | INTRAVENOUS | Status: AC
  Administered 2023-03-11: 15 mmol via INTRAVENOUS
  Filled 2023-03-11: qty 5

## 2023-03-11 MED ORDER — FUROSEMIDE 10 MG/ML IJ SOLN
80.0000 mg | Freq: Two times a day (BID) | INTRAMUSCULAR | Status: AC
  Administered 2023-03-11 (×2): 80 mg via INTRAVENOUS
  Filled 2023-03-11 (×2): qty 8

## 2023-03-11 MED ORDER — POLYETHYLENE GLYCOL 3350 17 G PO PACK
17.0000 g | PACK | Freq: Every day | ORAL | Status: DC
  Administered 2023-03-12 – 2023-03-17 (×5): 17 g via ORAL
  Filled 2023-03-11 (×7): qty 1

## 2023-03-11 NOTE — Progress Notes (Signed)
Patient ID: Steven [name], male   DOB: [birth year], 67 y.o.   MRN: [id number] HeartMate 3 Rounding Note  Subjective:    Stable night. Went into atrial fib (flutter?) yesterday with RVR and started on IV amio. Still looks like fib/flutter this am with rate 118. Has received a few boluses and on drip at 60/hr.  PA 40/23, CVP 18, CI 2.68, Co-ox 80 on 0.125 milrinone.  Developed bloody urine overnight which is new. He did not have any foley trauma in the OR and no blood in urine postop until last night. Question if it got pulled on.  -813 cc /24 hrs with diuretic. Creat minimally changed from yesterday am and about at baseline.  Wants to get out of bed.   LVAD INTERROGATION:  HeartMate IIl LVAD:  Flow 4.3 liters/min, speed 5400, power 4, PI 3.8.    Objective:    Vital Signs:   Temp:  [97.9 F (36.6 C)-99.1 F (37.3 C)] 98.4 F (36.9 C) (09/15 0815) Pulse Rate:  [75-166] 121 (09/15 0815) Resp:  [19-33] 22 (09/15 0815) BP: (88-98)/(70-78) 98/78 (09/15 0341) SpO2:  [69 %-100 %] 97 % (09/15 0815) Arterial Line BP: (74-126)/(53-87) 87/70 (09/15 0815) Weight:  [77.5 kg] 77.5 kg (09/15 0600) Last BM Date : 03/07/23 Mean arterial Pressure 100 by doppler  Intake/Output:   Intake/Output Summary (Last 24 hours) at 03/11/2023 0817 Last data filed at 03/11/2023 0700 Gross per 24 hour  Intake 2126.05 ml  Output 3025 ml  Net -898.95 ml     Physical Exam: General:  Well appearing. No resp difficulty HEENT: normal  Cor: distant heart sounds with LVAD hum present. Lungs: clear Abdomen: soft, nontender, nondistended. Good bowel sounds. Extremities: moderate edema but improving Neuro: alert & orientedx3, cranial nerves grossly intact. moves all 4 extremities w/o difficulty. Affect pleasant  Telemetry: atrial fib/flutter.  Labs: Basic Metabolic Panel: Recent Labs  Lab 03/09/23 0429 03/09/23 1036 03/09/23 1610 03/09/23 1614 03/10/23 0451 03/10/23 0459 03/10/23 1900 03/10/23 1910  03/11/23 0304  NA 138   < > 134*   < > 133* 135 131* 134* 131*  K 4.8   < > 4.3   < > 4.4 4.6 3.7 3.8 4.0  CL 103  --  103  --  102  --  99  --  100  CO2 22  --  19*  --  21*  --  19*  --  21*  GLUCOSE 92  --  124*  --  152*  --  169*  --  179*  BUN 20  --  19  --  21  --  24*  --  26*  CREATININE 1.28*  --  1.43*  --  1.50*  --  1.60*  --  1.58*  CALCIUM 8.4*  --  8.4*  --  8.5*  --  8.6*  --  8.5*  MG 2.4  --  2.4  --  2.5*  --  2.4  --  2.2  PHOS 4.6  --   --   --  3.4  --   --   --  2.2*   < > = values in this interval not displayed.    Liver Function Tests: Recent Labs  Lab 03/07/23 1500 03/09/23 0429 03/10/23 0451 03/11/23 0304  AST 35 54* 76* 66*  ALT 30 18 21 21   ALKPHOS 69 37* 45 53  BILITOT 0.9 4.2* 5.9* 3.8*  PROT 7.4 5.5* 6.2* 6.3*  ALBUMIN 3.4* 3.3* 3.5 3.2*  No results for input(s): "LIPASE", "AMYLASE" in the last 168 hours. No results for input(s): "AMMONIA" in the last 168 hours.  CBC: Recent Labs  Lab 03/08/23 2015 03/08/23 2024 03/09/23 0429 03/09/23 1036 03/09/23 1610 03/09/23 1614 03/10/23 0451 03/10/23 0459 03/10/23 1910 03/11/23 0304  WBC 13.1*  --  12.1*  --  14.3*  --  17.4*  --   --  18.5*  NEUTROABS  --   --  9.1*  --   --   --  15.0*  --   --  16.2*  HGB 8.2*   < > 9.4*   < > 9.1* 9.2* 8.5* 8.8* 9.2* 8.5*  HCT 24.7*   < > 27.3*   < > 27.1* 27.0* 25.6* 26.0* 27.0* 25.0*  MCV 92.5  --  91.9  --  90.9  --  91.4  --   --  90.6  PLT 108*  --  106*  --  115*  --  121*  --   --  125*   < > = values in this interval not displayed.    INR: Recent Labs  Lab 03/08/23 1336 03/08/23 2015 03/09/23 0429 03/10/23 0451 03/11/23 0304  INR 1.6* 1.5* 1.6* 2.1* 1.9*    Other results: EKG:   Imaging: DG Chest Port 1 View  Result Date: 03/10/2023 CLINICAL DATA:  LVAD device. EXAM: PORTABLE CHEST 1 VIEW COMPARISON:  03/09/2023 FINDINGS: Interval removal of the ET tube and enteric tube. Pulmonary arterial catheter and bilateral chest tubes are  stable in position. No pneumothorax identified. Left ventricular assist device is unchanged. There is a left chest wall ICD with leads in the right atrial appendage and right ventricle. Stable cardiomediastinal contours. Mild increased interstitial markings compared with the previous exam. Possible new small pleural effusions. IMPRESSION: 1. Interval removal of ET tube and enteric tube. 2. Stable position of bilateral chest tubes. No pneumothorax identified. 3. Suspect mild interstitial edema with possible small pleural effusions. Electronically Signed   By: Signa Kell M.D.   On: 03/10/2023 10:01     Medications:     Scheduled Medications:  acetaminophen  1,000 mg Oral Q6H   Or   acetaminophen (TYLENOL) oral liquid 160 mg/5 mL  1,000 mg Per Tube Q6H   aspirin EC  325 mg Oral Daily   Or   aspirin  324 mg Per Tube Daily   Or   aspirin  300 mg Rectal Daily   atorvastatin  80 mg Oral Daily   bisacodyl  10 mg Oral Daily   Or   bisacodyl  10 mg Rectal Daily   Chlorhexidine Gluconate Cloth  6 each Topical Daily   docusate sodium  200 mg Oral Daily   feeding supplement  237 mL Oral TID BM   insulin aspart  0-24 Units Subcutaneous Q4H   metoCLOPramide (REGLAN) injection  10 mg Intravenous Q6H   multivitamin with minerals  1 tablet Oral Daily   pantoprazole  40 mg Oral Daily   sodium chloride flush  3 mL Intravenous Q12H   tamsulosin  0.4 mg Oral QPC supper    Infusions:  sodium chloride 20 mL/hr at 03/10/23 2100   sodium chloride     sodium chloride Stopped (03/09/23 1448)   amiodarone 60 mg/hr (03/11/23 0700)   epinephrine Stopped (03/10/23 0452)   lactated ringers     lactated ringers     lactated ringers Stopped (03/10/23 0729)   milrinone 0.125 mcg/kg/min (03/11/23 0700)   norepinephrine (LEVOPHED) Adult  infusion Stopped (03/09/23 0044)   vasopressin Stopped (03/09/23 1843)    PRN Medications: sodium chloride, dextrose, hydrALAZINE, lactated ringers, midazolam, morphine  injection, ondansetron (ZOFRAN) IV, mouth rinse, oxyCODONE, sodium chloride flush, traMADol   Assessment:  POD 3 HM 3 LVAD   Acute on chronic combined systolic diastolic congestive heart failure due to ischemic cardiomyopathy with history of 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 Preop EF less than 20% with dilated LV to 7 cm. NYHA class IV CHF.   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   CAD: h/o large anterior MI 2013 treated w/ DES to LAD    H/O VT s/p Medtronic ICD. Now turned off for surgery.   Plan/Discussion:    Continue diuresis  Hypertension control  Remove both pleural and mediastinal chest tubes. Keep pocket drain today.  INR down to 1.9 from 2.1 yesterday. Will start low dose Coumadin to keep around 2. Would only give 1 mg today. Liver function is not normal and he is on amio.  Hopefully remove swan and arterial line if ok with AHF.  OOB, IS.  Hematuria: keep foley and observe. Irrigate as needed.    I reviewed the LVAD parameters from today, and compared the results to the patient's prior recorded data.  No programming changes were made.  The LVAD is functioning within specified parameters.   Length of Stay: [address]  Alleen Borne 03/11/2023, 8:17 AM

## 2023-03-11 NOTE — Progress Notes (Signed)
Patient ID: Steven [name], male   DOB: August 11, 1955, 67 y.o.   MRN: [id number]   Advanced Heart Failure VAD Team Note  PCP-Cardiologist: Dr. Gala Romney   Subjective:    9/12: HM3 LVAD Implant. Intra-op TEE LVEF 15%, RV normal  9/13: Extubated.  Driveline pulled back, 1 inch of Velour exposed.   He is on milrinone 0.125. Creatinine stable 1.5 => 1.6 => 1.58.  I/Os net negative -813 yesterday.   Patient went into atrial fibrillation with RVR yesterday, IV amiodarone started.  Now on amiodarone 60 mg/hr with HR 100s.   Hematuria noted this morning, no foley trauma.  INR 1.9.   Swan #s  CO-OX 80% CVP 17 PA 30/12 CI 2.68 PAPi 1.05  Tbili 5.9 => 3.8  LDH 299  No complaints, denies dyspnea.   LVAD INTERROGATION:  HeartMate III LVAD:   Flow 4.1 liters/min, speed 5400, power 3.8, PI 4.7.  No PI events.   Objective:    Vital Signs:   Temp:  [97.9 F (36.6 C)-99.1 F (37.3 C)] 98.2 F (36.8 C) (09/15 0830) Pulse Rate:  [75-166] 116 (09/15 0830) Resp:  [19-33] 26 (09/15 0830) BP: (88-98)/(70-78) 98/78 (09/15 0341) SpO2:  [69 %-100 %] 98 % (09/15 0830) Arterial Line BP: (74-126)/(53-87) 92/75 (09/15 0830) Weight:  [77.5 kg] 77.5 kg (09/15 0600) Last BM Date : 03/07/23 Mean arterial Pressure  70s-80s  Intake/Output:   Intake/Output Summary (Last 24 hours) at 03/11/2023 0842 Last data filed at 03/11/2023 0700 Gross per 24 hour  Intake 2126.05 ml  Output 3025 ml  Net -898.95 ml     Physical Exam    General: Well appearing this am. NAD.  HEENT: Normal. Neck: Supple, JVP 16 cm. Carotids OK.  Cardiac:  Mechanical heart sounds with LVAD hum present.  Lungs:  CTAB, normal effort.  Abdomen:  NT, ND, no HSM. No bruits or masses. +BS  LVAD exit site: Well-healed and incorporated. Dressing dry and intact. No erythema or drainage. Stabilization device present and accurately applied. Driveline dressing changed daily per sterile technique. Extremities:  Warm and dry. No cyanosis,  clubbing, rash, or edema.  Neuro:  Alert & oriented x 3. Cranial nerves grossly intact. Moves all 4 extremities w/o difficulty. Affect pleasant    Telemetry   Afib HR 100s, personally reviewed  EKG    No new EKG to review   Labs   Basic Metabolic Panel: Recent Labs  Lab 03/09/23 0429 03/09/23 1036 03/09/23 1610 03/09/23 1614 03/10/23 0451 03/10/23 0459 03/10/23 1900 03/10/23 1910 03/11/23 0304  NA 138   < > 134*   < > 133* 135 131* 134* 131*  K 4.8   < > 4.3   < > 4.4 4.6 3.7 3.8 4.0  CL 103  --  103  --  102  --  99  --  100  CO2 22  --  19*  --  21*  --  19*  --  21*  GLUCOSE 92  --  124*  --  152*  --  169*  --  179*  BUN 20  --  19  --  21  --  24*  --  26*  CREATININE 1.28*  --  1.43*  --  1.50*  --  1.60*  --  1.58*  CALCIUM 8.4*  --  8.4*  --  8.5*  --  8.6*  --  8.5*  MG 2.4  --  2.4  --  2.5*  --  2.4  --  2.2  PHOS 4.6  --   --   --  3.4  --   --   --  2.2*   < > = values in this interval not displayed.    Liver Function Tests: Recent Labs  Lab 03/07/23 1500 03/09/23 0429 03/10/23 0451 03/11/23 0304  AST 35 54* 76* 66*  ALT 30 18 21 21   ALKPHOS 69 37* 45 53  BILITOT 0.9 4.2* 5.9* 3.8*  PROT 7.4 5.5* 6.2* 6.3*  ALBUMIN 3.4* 3.3* 3.5 3.2*   No results for input(s): "LIPASE", "AMYLASE" in the last 168 hours. No results for input(s): "AMMONIA" in the last 168 hours.  CBC: Recent Labs  Lab 03/08/23 2015 03/08/23 2024 03/09/23 0429 03/09/23 1036 03/09/23 1610 03/09/23 1614 03/10/23 0451 03/10/23 0459 03/10/23 1910 03/11/23 0304  WBC 13.1*  --  12.1*  --  14.3*  --  17.4*  --   --  18.5*  NEUTROABS  --   --  9.1*  --   --   --  15.0*  --   --  16.2*  HGB 8.2*   < > 9.4*   < > 9.1* 9.2* 8.5* 8.8* 9.2* 8.5*  HCT 24.7*   < > 27.3*   < > 27.1* 27.0* 25.6* 26.0* 27.0* 25.0*  MCV 92.5  --  91.9  --  90.9  --  91.4  --   --  90.6  PLT 108*  --  106*  --  115*  --  121*  --   --  125*   < > = values in this interval not displayed.     INR: Recent Labs  Lab 03/08/23 1336 03/08/23 2015 03/09/23 0429 03/10/23 0451 03/11/23 0304  INR 1.6* 1.5* 1.6* 2.1* 1.9*    Other results: EKG:    Imaging   DG Chest Port 1 View  Result Date: 03/10/2023 CLINICAL DATA:  LVAD device. EXAM: PORTABLE CHEST 1 VIEW COMPARISON:  03/09/2023 FINDINGS: Interval removal of the ET tube and enteric tube. Pulmonary arterial catheter and bilateral chest tubes are stable in position. No pneumothorax identified. Left ventricular assist device is unchanged. There is a left chest wall ICD with leads in the right atrial appendage and right ventricle. Stable cardiomediastinal contours. Mild increased interstitial markings compared with the previous exam. Possible new small pleural effusions. IMPRESSION: 1. Interval removal of ET tube and enteric tube. 2. Stable position of bilateral chest tubes. No pneumothorax identified. 3. Suspect mild interstitial edema with possible small pleural effusions. Electronically Signed   By: Signa Kell M.D.   On: 03/10/2023 10:01     Medications:     Scheduled Medications:  acetaminophen  1,000 mg Oral Q6H   Or   acetaminophen (TYLENOL) oral liquid 160 mg/5 mL  1,000 mg Per Tube Q6H   aspirin EC  325 mg Oral Daily   Or   aspirin  324 mg Per Tube Daily   Or   aspirin  300 mg Rectal Daily   atorvastatin  80 mg Oral Daily   bisacodyl  10 mg Oral Daily   Or   bisacodyl  10 mg Rectal Daily   Chlorhexidine Gluconate Cloth  6 each Topical Daily   docusate sodium  200 mg Oral Daily   feeding supplement  237 mL Oral TID BM   insulin aspart  0-24 Units Subcutaneous Q4H   metoCLOPramide (REGLAN) injection  10 mg Intravenous Q6H   multivitamin with minerals  1 tablet Oral Daily  pantoprazole  40 mg Oral Daily   sodium chloride flush  3 mL Intravenous Q12H   tamsulosin  0.4 mg Oral QPC supper    Infusions:  sodium chloride 20 mL/hr at 03/10/23 2100   sodium chloride     sodium chloride Stopped (03/09/23  1448)   amiodarone 60 mg/hr (03/11/23 0700)   epinephrine Stopped (03/10/23 0452)   lactated ringers     lactated ringers     lactated ringers Stopped (03/10/23 0729)   milrinone 0.125 mcg/kg/min (03/11/23 0700)   norepinephrine (LEVOPHED) Adult infusion Stopped (03/09/23 0044)   sodium phosphate 15 mmol in dextrose 5 % 250 mL infusion     vasopressin Stopped (03/09/23 1843)    PRN Medications: sodium chloride, dextrose, hydrALAZINE, lactated ringers, midazolam, morphine injection, ondansetron (ZOFRAN) IV, mouth rinse, oxyCODONE, sodium chloride flush, traMADol   Patient Profile   Kacee P Hawks is a 67 y.o. 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    Admitted with acute on chronic systolic and diastolic CHF with low-output. Advanced Heart Failure consulted and workup started for advanced therapies. S/p HM3 LVAD 9/12.   Assessment/Plan:    1. Acute on chronic combined systolic and diastolic heart failure>>Low Output S/p HM3 LVAD on 9/12.  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 NYHA IV on admission.  Echo 02/27/23: EF <20%, LV with GHK, RV mildly reduced, GIIDD, LA mod dilated, mild MR.  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 GDMT limited by renal function and hypotension.  S/p HM3 LVAD implant 9/12.  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 Intra-op TEE LVEF 15%, RV normal.  Currently on milrinone 0.125 with MAP 80s.  CVP 17-18 today with PAPi marginal at 1.05. CI 2.68 with co-ox 80%. LVAD parameters stable, no low flows.  - Continue milrinone 0.125 while diuresing.  - Lasix 80 mg IV bid today and will give 1 dose of metolazone 2.5.  Replace K.    - To remove chest tubes today, pocket drain to remain.  - Will give low dose warfarin today with INR 1.9.  - Continue ASA 325  until INR > 2.  - Can remove Swan, place PICC to follow co-ox/CVP.  2. 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  - on statin  - on ASA  3. AKI on CKD stage 3: Creatinine stable today at 1.58.  4. Mural thrombus: Warfarin.  5. H/o VT s/p Medtronic ICD: likely scar mediated - Continue amiodarone  6. Anemia: Post-op.  - Transfuse hgb < 8.  7. Atrial fibrillation: With RVR. Rate 100s today.  - Continue amiodarone gtt 60 mg/hr today.  8. Hematuria: New this morning, no prior history.  INR 1.9.  No known foley trauma. - Follow today.   Mobilize  I reviewed the LVAD parameters from today, and compared the results to the patient's prior recorded data.  No programming changes were made.  The LVAD is functioning within specified parameters.  The patient performs LVAD self-test daily.  LVAD interrogation was negative for any significant power changes, alarms or PI events/speed drops.  LVAD equipment check completed and is in good working order.  Back-up equipment present.   LVAD education done on emergency procedures and precautions and reviewed exit site care.  CRITICAL CARE Performed by: Freida Busman  Mackay Hanauer  Total critical care time: 40 minutes  Critical care time was exclusive of separately billable procedures and treating other patients.  Critical care was necessary to treat or prevent imminent or life-threatening deterioration.  Critical care was time spent personally by me on the following activities: development of treatment plan with patient and/or surrogate as well as nursing, discussions with consultants, evaluation of patient's response to treatment, examination of patient, obtaining history from patient or surrogate, ordering and performing treatments and interventions, ordering and review of laboratory studies, ordering and review of radiographic studies, pulse oximetry and re-evaluation of patient's condition.  Length of Stay: 82  Marca Ancona, MD 03/11/2023, 8:42  AM  VAD Team --- VAD ISSUES ONLY--- Pager [phone] (7am - 7am)  Advanced Heart Failure Team  Pager [phone] (M-F; 7a - 5p)  Please contact [facility] for night-coverage after hours (5p -7a ) and weekends on [email or web address]

## 2023-03-11 NOTE — Progress Notes (Signed)
Peripherally Inserted Central Catheter Placement  The IV Nurse has discussed with the patient and/or persons authorized to consent for the patient, the purpose of this procedure and the potential benefits and risks involved with this procedure.  The benefits include less needle sticks, lab draws from the catheter, and the patient may be discharged home with the catheter. Risks include, but not limited to, infection, bleeding, blood clot (thrombus formation), and puncture of an artery; nerve damage and irregular heartbeat and possibility to perform a PICC exchange if needed/ordered by physician.  Alternatives to this procedure were also discussed.  Bard Power PICC patient education guide, fact sheet on infection prevention and patient information card has been provided to patient /or left at bedside.    PICC Placement Documentation  PICC Triple Lumen 03/11/23 Right Brachial 38 cm 0 cm (Active)  Indication for Insertion or Continuance of Line Vasoactive infusions 03/11/23 1238  Exposed Catheter (cm) 0 cm 03/11/23 1238  Site Assessment Clean, Dry, Intact 03/11/23 1238  Lumen #1 Status Flushed;Saline locked;Blood return noted 03/11/23 1238  Lumen #2 Status Flushed;Saline locked;Blood return noted 03/11/23 1238  Lumen #3 Status Flushed;Saline locked;Blood return noted 03/11/23 1238  Dressing Type Transparent;Securing device 03/11/23 1238  Dressing Status Antimicrobial disc in place;Clean, Dry, Intact 03/11/23 1238  Line Care Connections checked and tightened 03/11/23 1238  Line Adjustment (NICU/IV Team Only) No 03/11/23 1238  Dressing Intervention New dressing 03/11/23 1238  Dressing Change Due 03/18/23 03/11/23 1238       Elliot Dally 03/11/2023, 12:39 PM

## 2023-03-11 NOTE — Progress Notes (Signed)
Drive Line: Existing VAD dressing removed and site care performed using sterile technique. Drive line exit site cleaned with Chlora prep applicators x 2, allowed to dry, and gauze dressing with Silver strip applied. There is a single suture present. Exit site unincorporated with the velour remaining  exposed approx 1" at exit site. Scant amount of bloody drainage. No redness, tenderness, foul odor or rash noted. Drive line anchor re-applied. Next dressing due tomorrow 03/12/23 by nurse champion or VAD coordinator.

## 2023-03-12 DIAGNOSIS — Z95811 Presence of heart assist device: Secondary | ICD-10-CM

## 2023-03-12 DIAGNOSIS — I5023 Acute on chronic systolic (congestive) heart failure: Secondary | ICD-10-CM | POA: Diagnosis not present

## 2023-03-12 DIAGNOSIS — I5043 Acute on chronic combined systolic (congestive) and diastolic (congestive) heart failure: Secondary | ICD-10-CM

## 2023-03-12 LAB — COMPREHENSIVE METABOLIC PANEL
ALT: 20 U/L (ref 0–44)
AST: 53 U/L — ABNORMAL HIGH (ref 15–41)
Albumin: 3 g/dL — ABNORMAL LOW (ref 3.5–5.0)
Alkaline Phosphatase: 69 U/L (ref 38–126)
Anion gap: 13 (ref 5–15)
BUN: 34 mg/dL — ABNORMAL HIGH (ref 8–23)
CO2: 23 mmol/L (ref 22–32)
Calcium: 8.7 mg/dL — ABNORMAL LOW (ref 8.9–10.3)
Chloride: 95 mmol/L — ABNORMAL LOW (ref 98–111)
Creatinine, Ser: 1.72 mg/dL — ABNORMAL HIGH (ref 0.61–1.24)
GFR, Estimated: 43 mL/min — ABNORMAL LOW (ref >60)
Glucose, Bld: 130 mg/dL — ABNORMAL HIGH (ref 70–99)
Potassium: 3.9 mmol/L (ref 3.5–5.1)
Sodium: 131 mmol/L — ABNORMAL LOW (ref 135–145)
Total Bilirubin: 2.3 mg/dL — ABNORMAL HIGH (ref 0.3–1.2)
Total Protein: 6.4 g/dL — ABNORMAL LOW (ref 6.5–8.1)

## 2023-03-12 LAB — CBC WITH DIFFERENTIAL/PLATELET
Abs Immature Granulocytes: 0.09 10*3/uL — ABNORMAL HIGH (ref 0.00–0.07)
Basophils Absolute: 0 10*3/uL (ref 0.0–0.1)
Basophils Relative: 0 %
Eosinophils Absolute: 0 10*3/uL (ref 0.0–0.5)
Eosinophils Relative: 0 %
HCT: 24.1 % — ABNORMAL LOW (ref 39.0–52.0)
Hemoglobin: 8.5 g/dL — ABNORMAL LOW (ref 13.0–17.0)
Immature Granulocytes: 1 %
Lymphocytes Relative: 6 %
Lymphs Abs: 1 10*3/uL (ref 0.7–4.0)
MCH: 32.1 pg (ref 26.0–34.0)
MCHC: 35.3 g/dL (ref 30.0–36.0)
MCV: 90.9 fL (ref 80.0–100.0)
Monocytes Absolute: 1.5 10*3/uL — ABNORMAL HIGH (ref 0.1–1.0)
Monocytes Relative: 10 %
Neutro Abs: 12.7 10*3/uL — ABNORMAL HIGH (ref 1.7–7.7)
Neutrophils Relative %: 83 %
Platelets: 151 10*3/uL (ref 150–400)
RBC: 2.65 MIL/uL — ABNORMAL LOW (ref 4.22–5.81)
RDW: 15.8 % — ABNORMAL HIGH (ref 11.5–15.5)
WBC: 15.4 10*3/uL — ABNORMAL HIGH (ref 4.0–10.5)
nRBC: 0 % (ref 0.0–0.2)

## 2023-03-12 LAB — POCT I-STAT 7, (LYTES, BLD GAS, ICA,H+H)
Acid-Base Excess: 3 mmol/L — ABNORMAL HIGH (ref 0.0–2.0)
Bicarbonate: 25 mmol/L (ref 20.0–28.0)
Calcium, Ion: 1.16 mmol/L (ref 1.15–1.40)
HCT: 25 % — ABNORMAL LOW (ref 39.0–52.0)
Hemoglobin: 8.5 g/dL — ABNORMAL LOW (ref 13.0–17.0)
O2 Saturation: 97 %
Patient temperature: 37.3
Potassium: 3.8 mmol/L (ref 3.5–5.1)
Sodium: 133 mmol/L — ABNORMAL LOW (ref 135–145)
TCO2: 26 mmol/L (ref 22–32)
pCO2 arterial: 29.6 mmHg — ABNORMAL LOW (ref 32–48)
pH, Arterial: 7.535 — ABNORMAL HIGH (ref 7.35–7.45)
pO2, Arterial: 76 mmHg — ABNORMAL LOW (ref 83–108)

## 2023-03-12 LAB — LACTATE DEHYDROGENASE: LDH: 310 U/L — ABNORMAL HIGH (ref 98–192)

## 2023-03-12 LAB — PROTIME-INR
INR: 1.5 — ABNORMAL HIGH (ref 0.8–1.2)
Prothrombin Time: 18.7 s — ABNORMAL HIGH (ref 11.4–15.2)

## 2023-03-12 LAB — GLUCOSE, CAPILLARY
Glucose-Capillary: 106 mg/dL — ABNORMAL HIGH (ref 70–99)
Glucose-Capillary: 115 mg/dL — ABNORMAL HIGH (ref 70–99)
Glucose-Capillary: 120 mg/dL — ABNORMAL HIGH (ref 70–99)
Glucose-Capillary: 134 mg/dL — ABNORMAL HIGH (ref 70–99)
Glucose-Capillary: 155 mg/dL — ABNORMAL HIGH (ref 70–99)
Glucose-Capillary: 160 mg/dL — ABNORMAL HIGH (ref 70–99)

## 2023-03-12 LAB — COOXEMETRY PANEL
Carboxyhemoglobin: 2 % — ABNORMAL HIGH (ref 0.5–1.5)
Methemoglobin: <0.7 % (ref 0.0–1.5)
O2 Saturation: 66.9 %
Total hemoglobin: 9.2 g/dL — ABNORMAL LOW (ref 12.0–16.0)

## 2023-03-12 LAB — MAGNESIUM: Magnesium: 2.1 mg/dL (ref 1.7–2.4)

## 2023-03-12 LAB — PHOSPHORUS: Phosphorus: 3.5 mg/dL (ref 2.5–4.6)

## 2023-03-12 MED ORDER — TORSEMIDE 20 MG PO TABS
40.0000 mg | ORAL_TABLET | Freq: Once | ORAL | Status: AC
  Administered 2023-03-12: 40 mg via ORAL
  Filled 2023-03-12: qty 2

## 2023-03-12 MED ORDER — TORSEMIDE 20 MG PO TABS: 40.0000 mg | ORAL_TABLET | Freq: Two times a day (BID) | ORAL | Status: DC

## 2023-03-12 MED ORDER — FUROSEMIDE 10 MG/ML IJ SOLN: 40.0000 mg | Freq: Two times a day (BID) | INTRAMUSCULAR | Status: DC

## 2023-03-12 MED ORDER — HYDRALAZINE HCL 25 MG PO TABS
12.5000 mg | ORAL_TABLET | Freq: Three times a day (TID) | ORAL | Status: DC
  Administered 2023-03-12 – 2023-03-13 (×4): 12.5 mg via ORAL
  Filled 2023-03-12 (×4): qty 1

## 2023-03-12 MED ORDER — ISOSORBIDE MONONITRATE ER 30 MG PO TB24
30.0000 mg | ORAL_TABLET | Freq: Every day | ORAL | Status: DC
  Administered 2023-03-12 – 2023-03-13 (×2): 30 mg via ORAL
  Filled 2023-03-12 (×2): qty 1

## 2023-03-12 MED ORDER — POTASSIUM CHLORIDE CRYS ER 20 MEQ PO TBCR
20.0000 meq | EXTENDED_RELEASE_TABLET | Freq: Once | ORAL | Status: AC
  Administered 2023-03-12: 20 meq via ORAL
  Filled 2023-03-12: qty 1

## 2023-03-12 MED ORDER — WARFARIN SODIUM 2.5 MG PO TABS
2.5000 mg | ORAL_TABLET | Freq: Once | ORAL | Status: AC
  Administered 2023-03-12: 2.5 mg via ORAL
  Filled 2023-03-12: qty 1

## 2023-03-12 MED ORDER — INSULIN ASPART 100 UNIT/ML IJ SOLN
0.0000 [IU] | Freq: Three times a day (TID) | INTRAMUSCULAR | Status: DC
  Administered 2023-03-12 – 2023-03-13 (×3): 2 [IU] via SUBCUTANEOUS
  Administered 2023-03-13: 4 [IU] via SUBCUTANEOUS
  Administered 2023-03-14 (×2): 2 [IU] via SUBCUTANEOUS
  Administered 2023-03-15 – 2023-03-16 (×2): 4 [IU] via SUBCUTANEOUS
  Administered 2023-03-16 – 2023-03-18 (×4): 2 [IU] via SUBCUTANEOUS
  Administered 2023-03-19: 4 [IU] via SUBCUTANEOUS
  Administered 2023-03-20: 2 [IU] via SUBCUTANEOUS

## 2023-03-12 NOTE — Progress Notes (Signed)
ANTICOAGULATION CONSULT NOTE - Initial Consult  Pharmacy Consult for warfarin management Indication:  LVAD  No Known Allergies  Patient Measurements: Height: 5\' 9"  (175.3 cm) Weight: 73.5 kg (162 lb 0.6 oz) IBW/kg (Calculated) : 70.7 Heparin Dosing Weight: 73.5 kg  Vital Signs: Temp: 98.5 F (36.9 C) (09/16 0800) Pulse Rate: 82 (09/16 0700)  Labs: Recent Labs    03/10/23 0451 03/10/23 0459 03/11/23 0304 03/11/23 1430 03/11/23 1751 03/12/23 0330 03/12/23 0627  HGB 8.5*   < > 8.5*  --  8.3* 8.5* 8.5*  HCT 25.6*   < > 25.0*  --  25.1* 24.1* 25.0*  PLT 121*  --  125*  --  136* 151  --   LABPROT 24.0*  --  21.8*  --   --  18.7*  --   INR 2.1*  --  1.9*  --   --  1.5*  --   CREATININE 1.50*   < > 1.58* 1.68*  --  1.72*  --    < > = values in this interval not displayed.    Estimated Creatinine Clearance: 41.1 mL/min (A) (by C-G formula based on SCr of 1.72 mg/dL (H)).   Medical History: Past Medical History:  Diagnosis Date   Acute MI, anterior wall (HCC)    AICD (automatic cardioverter/defibrillator) present    CAD (coronary artery disease)    2D ECHO, 07/13/2011 - EF <25%, LV moderatelty dilated, LA moderately dilatedLEXISCAN, 12/14/2011 - moderate-severe perfusion defect seen in the basal anteroseptal, mid anterior, apicacl anterior, apical, apical inferior, and apical lateral regions, post-stress EF 25%, new EKG changes from baseline abnormalities   Cancer (HCC)    Prostate   CHF (congestive heart failure) (HCC) 2012   Hypertension 08/08/2021   Inguinal hernia, left    Pneumonia    November 2023   Pre-diabetes     Medications:  Medications Prior to Admission  Medication Sig Dispense Refill Last Dose   acetaminophen (TYLENOL) 500 MG tablet Take 1,000 mg by mouth every 6 (six) hours as needed for mild pain.   Past Week   amiodarone (PACERONE) 200 MG tablet Take 2 tablets (400 mg total) by mouth 2 (two) times daily for 14 days, THEN 1 tablet (200 mg total) 2  (two) times daily for 14 days. 84 tablet 0 02/25/2023 at am   atorvastatin (LIPITOR) 80 MG tablet Take 1 tablet (80 mg total) by mouth daily. 30 tablet 3 02/25/2023 at am   Cholecalciferol (VITAMIN D) 125 MCG (5000 UT) CAPS Take 5,000 Units by mouth daily.   02/25/2023 at am   dapagliflozin propanediol (FARXIGA) 10 MG TABS tablet Take 1 tablet (10 mg total) by mouth daily. 30 tablet 3 02/25/2023 at am   metoprolol succinate (TOPROL-XL) 25 MG 24 hr tablet Take 1 tablet (25 mg total) by mouth daily. 30 tablet 2 02/25/2023 at am   Multiple Vitamin (MULTIVITAMIN) tablet Take 1 tablet by mouth daily.   02/25/2023 at am   nitroGLYCERIN (NITROSTAT) 0.4 MG SL tablet Place 1 tablet (0.4 mg total) under the tongue every 5 (five) minutes as needed. Usual dose is q 5 minutes x 3 doses. 75 tablet 0 02/26/2023   potassium chloride SA (KLOR-CON M) 20 MEQ tablet Take 1 tablet (20 mEq total) by mouth daily. 30 tablet 2 02/25/2023 at am   sacubitril-valsartan (ENTRESTO) 24-26 MG Take 1 tablet by mouth 2 (two) times daily. 60 tablet 3 02/25/2023 at am   spironolactone (ALDACTONE) 25 MG tablet Take 0.5 tablets (  12.5 mg total) by mouth every other day. TAKE EOD ON EVEN DAYS 15 tablet 6 02/24/2023 at am   tamsulosin (FLOMAX) 0.4 MG CAPS capsule Take 0.4 mg by mouth in the morning and at bedtime.   02/25/2023 at am   torsemide (DEMADEX) 20 MG tablet Take 1 tablet by mouth every other day. 45 tablet 3 02/24/2023 at am   warfarin (COUMADIN) 5 MG tablet Take 1 tablet daily except 1/2 tablet on Wednesdays and Fridays or as directed. (Patient taking differently: Take 2.5-5 mg by mouth See admin instructions. Take 1 tablet by mouth every Tue and Thur, then take 1/2 tablet all other days) 90 tablet 1 02/25/2023 at 1230   ZENPEP 40000-126000 units CPEP Take 1 capsule by mouth 3 (three) times daily with meals.   02/25/2023 at pm   amiodarone (PACERONE) 200 MG tablet Take 1 tablet (200 mg) by mouth daily. 30 tablet 5    Scheduled:   acetaminophen  1,000 mg  Oral Q6H   Or   acetaminophen (TYLENOL) oral liquid 160 mg/5 mL  1,000 mg Per Tube Q6H   aspirin EC  81 mg Oral Daily   Or   aspirin  81 mg Per Tube Daily   Or   aspirin  150 mg Rectal Daily   atorvastatin  80 mg Oral Daily   bisacodyl  10 mg Oral Daily   Or   bisacodyl  10 mg Rectal Daily   Chlorhexidine Gluconate Cloth  6 each Topical Daily   docusate sodium  200 mg Oral Daily   feeding supplement  237 mL Oral TID BM   hydrALAZINE  12.5 mg Oral Q8H   insulin aspart  0-24 Units Subcutaneous TID WC   isosorbide mononitrate  30 mg Oral Daily   metoCLOPramide (REGLAN) injection  10 mg Intravenous Q6H   multivitamin with minerals  1 tablet Oral Daily   pantoprazole  40 mg Oral Daily   polyethylene glycol  17 g Oral Daily   potassium chloride  20 mEq Oral Once   sodium chloride flush  10-40 mL Intracatheter Q12H   sodium chloride flush  3 mL Intravenous Q12H   tamsulosin  0.4 mg Oral QPC supper   torsemide  40 mg Oral Once   warfarin  2.5 mg Oral ONCE-1600   Warfarin - Physician Dosing Inpatient   Does not apply q1600   Infusions:   sodium chloride 20 mL/hr at 03/10/23 2100   sodium chloride     amiodarone 60 mg/hr (03/12/23 0700)   lactated ringers     lactated ringers Stopped (03/10/23 0729)   PRN: sodium chloride, dextrose, hydrALAZINE, morphine injection, ondansetron (ZOFRAN) IV, mouth rinse, oxyCODONE, sodium chloride flush, sodium chloride flush, traMADol  Assessment: Pt presented to [facility] ED on 02/26/2023 with shortness of breath and SpO2 of 78 on room air. Pt has PMH of ICM (LAD infarct), HFrEF (EF <20%), LVT, VT s/p ICD, HTN, CKD3, HLD  Pt underwent LVAD implantation on 9/12. Anticoagulation was held immediately post-procedure due to risk of major bleeding events. Warfarin was initiation 9/15, with 1 dose of 1 mg given. Pt's INR previously elevated and now down-trending (2.1 >1.9 >1.5). Pt's H/H is stable post-VAD (8-9). Pt does have significant amount of hematuria that  should continue to be monitored. Pt is on an oral diet but has reduced appetite at this time.  Goal of Therapy:  2.0-2.5 Monitor platelets by anticoagulation protocol: Yes   Plan:  Give warfarin 2.5 mg x1 Monitor  daily INR, CBC, diet improvement, and s/sx of bleeding   Wilmer Floor, PharmD PGY2 Cardiology Pharmacy Resident 03/12/2023,9:03 AM

## 2023-03-12 NOTE — Progress Notes (Signed)
Physical Therapy Treatment Patient Details Name: Steven [name] MRN: [id number] DOB: 05-15-1956 Today's Date: 03/12/2023   History of Present Illness Pt is 67 yo male admitted on 02/26/23 for acute on chronic systolic and diastolic CHF with low output.  S/P implantation of LVAD Heartmate 3  (9/12). PMHx includes but not limited to ICM, CAD, VT s/p medtronic ICD, HLD, apical mural thrombus, CKD, subdural hematoma.    PT Comments  Pt able to begin ambulation this date with use of eva walker. Pt eager to mobilize and rehab to return to independent. Encourage pt to ambulate with RN staff later today. Pt to benefit from inpatient rehab program > 3 hrs a day to progress towards indep prior to returning home with spouse. Acute PT to cont to follow.    If plan is discharge home, recommend the following: A little help with walking and/or transfers;A little help with bathing/dressing/bathroom   Can travel by private vehicle        Equipment Recommendations  Other (comment) (TBD)    Recommendations for Other Services       Precautions / Restrictions Precautions Precautions: Sternal;Other (comment) (LVAD) Precaution Comments: chest tube, blood in urine Restrictions Weight Bearing Restrictions: No RUE Weight Bearing: Non weight bearing LUE Weight Bearing: Non weight bearing Other Position/Activity Restrictions: sternal prec     Mobility  Bed Mobility Overal bed mobility: Needs Assistance Bed Mobility: Sit to Supine       Sit to supine: Mod assist, +2 for physical assistance   General bed mobility comments: mod A +2 for physical assist and multiple lines, returned to R sidelying, modA for LE back up into bed, able to then roll over onto back    Transfers Overall transfer level: Needs assistance Equipment used: 2 person hand held assist Transfers: Sit to/from Stand Sit to Stand: Mod assist, Max assist, +2 physical assistance, +2 safety/equipment           General transfer comment:  used rocking momentum, pt held onto pillow    Ambulation/Gait Ambulation/Gait assistance: Independent, Mod assist, +2 safety/equipment Gait Distance (Feet): 180 Feet Assistive device: Fara Boros Gait Pattern/deviations: Step-through pattern, Decreased stride length, Trunk flexed Gait velocity: dec Gait velocity interpretation: <1.31 ft/sec, indicative of household ambulator   General Gait Details: pt with short step height and length, verbal cues to look up/ahead and rely more on LEs than UEs, minA for eva walker management   Stairs             Wheelchair Mobility     Tilt Bed    Modified Rankin (Stroke Patients Only)       Balance Overall balance assessment: Needs assistance Sitting-balance support: No upper extremity supported, Feet supported Sitting balance-Leahy Scale: Good Sitting balance - Comments: no external support seated EOB   Standing balance support: During functional activity, Reliant on assistive device for balance Standing balance-Leahy Scale: Poor Standing balance comment: reliant on external support                            Cognition Arousal: Alert Behavior During Therapy: WFL for tasks assessed/performed Overall Cognitive Status: Within Functional Limits for tasks assessed                                 General Comments: able to follow commands,soft spoken but increased spirits once in hallway  Exercises Other Exercises Other Exercises: hip/knee flex/ext ROM with graded resistance x 10 reps bil Other Exercises: full ROM of bil UE's    General Comments General comments (skin integrity, edema, etc.): VSS. pt currently dependent for switching LVAD from wall to batteries as he hasn't not been educated or instructed on how to do it yet from LVAD team.      Pertinent Vitals/Pain Pain Assessment Pain Assessment: Faces Faces Pain Scale: Hurts a little bit Pain Location: generalized Pain Descriptors /  Indicators: Discomfort Pain Intervention(s): Monitored during session    Home Living                          Prior Function            PT Goals (current goals can now be found in the care plan section) Acute Rehab PT Goals Patient Stated Goal: not stated PT Goal Formulation: With patient Time For Goal Achievement: 03/23/23 Potential to Achieve Goals: Good Progress towards PT goals: Progressing toward goals    Frequency    Min 1X/week      PT Plan      Co-evaluation   Reason for Co-Treatment: To address functional/ADL transfers;Complexity of the patient's impairments (multi-system involvement);For patient/therapist safety PT goals addressed during session: Mobility/safety with mobility OT goals addressed during session: ADL's and self-care      AM-PAC PT "6 Clicks" Mobility   Outcome Measure  Help needed turning from your back to your side while in a flat bed without using bedrails?: A Lot Help needed moving from lying on your back to sitting on the side of a flat bed without using bedrails?: A Lot Help needed moving to and from a bed to a chair (including a wheelchair)?: Total Help needed standing up from a chair using your arms (e.g., wheelchair or bedside chair)?: Total Help needed to walk in hospital room?: Total Help needed climbing 3-5 steps with a railing? : Total 6 Click Score: 8    End of Session Equipment Utilized During Treatment: Oxygen Activity Tolerance: Patient tolerated treatment well Patient left: in bed;with call bell/phone within reach;with nursing/sitter in room Nurse Communication: Mobility status PT Visit Diagnosis: Other abnormalities of gait and mobility (R26.89);Other (comment)     Time: 8841-6606 PT Time Calculation (min) (ACUTE ONLY): 33 min  Charges:    $Gait Training: 23-37 mins PT General Charges $$ ACUTE PT VISIT: 1 Visit                     Lewis Shock, PT, DPT Acute Rehabilitation Services Secure chat  preferred Office #: [phone]    Iona Hansen 03/12/2023, 9:55 AM

## 2023-03-12 NOTE — Progress Notes (Signed)
LVAD Coordinator Rounding Note:  Admitted 02/26/23 due to CHF.   HM3 LVAD implanted on 03/08/23 by Dr Laneta Simmers under DT criteria.  9/12: HM3 LVAD Implant. Intra-op TEE LVEF 15%, RV normal  9/13: Extubated.  Driveline pulled back, 1 inch of Velour exposed.  9/14: Went into atrial fibrillation with RVR,  IV amiodarone started.  9/15: Hematuria.   Vital signs: Temp: 98.5 HR: 80 Doppler Pressure:78 Art BP: 99/81 (89) O2 Sat: 97% on 4L/[place] Wt:155.6>169.5>162lbs     LVAD interrogation reveals:   Speed: 5400 Flow: 3.8 Power:  3.8 PI: 4.5  Alarms: none  Events:  none today Hematocrit: 20 Fixed speed: 5400 Low speed limit: 5100   Drive Line: Existing VAD dressing removed and site care performed using sterile technique. Drive line exit site cleaned with Chlora prep applicators x 2, allowed to dry, and gauze dressing with Silver strip applied. There is a single suture present. The driveline has been pulled out approx 1".  Exit site unincorporated, the velour is now exposed approx 1" at exit site. Scant amount of bloody drainage. No redness, tenderness, foul odor or rash noted. Drive line anchor re-applied. Pt denies fever or chills. Advance to every other day dressing changes. Next dressing change due 03/14/23 by nurse champion or VAD coordinator.         Labs:  LDH trend: 234>310  INR trend: 1.6>1.5  Anticoagulation Plan: -INR Goal: 2-2.5 -ASA Dose: 325 mg until INR therapeutic  Blood Products:  -Intra op: 03/08/23 1 PRBC 2 FFP 1 PLT 1 Cryo 450 cell saver  Post op: 03/08/23 1 PRBC 2 FFP 1 PLT 1 Cryo  03/09/23: 1 PRBC  Device: -Medtronic -Therapies: OFF  Arrythmias:   Respiratory: on 4L/[place]  Infection:   Renal:  -CRT: 1.28>1.72  Gtts: Milrinone 0.125 mcg/kg/min Amiodarone 30 mg/hr  Patient Education:  Pt educated on importance of wearing mask and hat during driveline dressing changes.   Adverse Events on VAD: -  Plan/Recommendations:  1. Please page  VAD coordinator with any equipment issues or driveline problems. 2. Advance to every other day dressing changes using silver strip by VAD coordinator or nurse champion.    Carlton Adam RN, BSN VAD Coordinator 24/7 Pager [phone]

## 2023-03-12 NOTE — Progress Notes (Signed)
Outpatient Heart Failure CSW met with pt at bedside to check in.  Pt reports he has been doing ok since his surgery but is feeling cooped up and wishing he had made more progress physically.  Has asked his caregiver not to visit right now due to feeing insecure about how weak he is.  Spoke with pt about his coping mechanisms and he mainly just focuses on the fact that this is something he most go through and climb out of.  Reports he needs no further support from [place] for his mental health at this time.  One of his main stressors continues to be finances.  Has a water bill, [facility] bill, and internet bill all due currently and no way to pay it.  CSW informed of patient care fund and necessary documents but all of patients bills are at home and he reports there is no way for anyone else to access them to bring here.  Patient agreeable to CSW coming back tomorrow to help him call utility companies so we can inform them of his current status and ask for leniency with his current bills.  Pt also upset at the loss of his phone.  Does not have a lot of his contacts saved so has not had a way to communicate with friends.  CSW will assist in contacting friends and assessing for potential assistance with getting another phone so he can access his support system.  Will continue to follow and assist as needed  Burna Sis, LCSW Clinical Social Worker [facility] Desk#: [phone] Cell#: [phone]

## 2023-03-12 NOTE — Progress Notes (Addendum)
Patient ID: Steven [name], male   DOB: [birth year], 67 y.o.   MRN: [id number]   Advanced Heart Failure VAD Team Note  PCP-Cardiologist: Dr. Gala Romney   Subjective:    9/12: HM3 LVAD Implant. Intra-op TEE LVEF 15%, RV normal  9/13: Extubated.  Driveline pulled back, 1 inch of Velour exposed.  9/14: Went into atrial fibrillation with RVR,  IV amiodarone started.  9/15: Hematuria.   He is on milrinone 0.125. CO-OX 67%.   Remains on amio drip 60 mg per hour.    Hematuria noted this morning, no foley trauma.  INR 1.9.   Tbili 5.9 => 3.8=>2.3   LDH 310   Complaining of pain.   LVAD INTERROGATION:  HeartMate III LVAD:   Flow 4.4 liters/min, speed 5400, power 3, PI 4.  No PI events.   Objective:    Vital Signs:   Temp:  [98.1 F (36.7 C)-100 F (37.8 C)] 99.1 F (37.3 C) (09/16 0630) Pulse Rate:  [72-157] 82 (09/16 0700) Resp:  [14-29] 19 (09/16 0700) SpO2:  [91 %-100 %] 100 % (09/16 0700) Arterial Line BP: (76-123)/(54-101) 102/83 (09/16 0700) Weight:  [73.5 kg] 73.5 kg (09/16 0645) Last BM Date : 03/07/23 Mean arterial Pressure  80-100s  Intake/Output:   Intake/Output Summary (Last 24 hours) at 03/12/2023 0832 Last data filed at 03/12/2023 0700 Gross per 24 hour  Intake 1135.12 ml  Output 3725 ml  Net -2589.88 ml    CVP 8-9  Physical Exam    Physical Exam: GENERAL: No acute distress. In the chair. Appears weak.  HEENT: normal  NECK: Supple, JVP 8-9 .  2+ bilaterally, no bruits.  No lymphadenopathy or thyromegaly appreciated.   CARDIAC:  Mechanical heart sounds with LVAD hum present.  LUNGS:  Coarse throughout ABDOMEN:  Soft, round, nontender, positive bowel sounds x4.     LVAD exit site: Dressing dry and intact.  No erythema or drainage.  Stabilization device present and accurately applied.  Driveline dressing is being changed daily per sterile technique. EXTREMITIES:  Warm and dry, no cyanosis, clubbing, rash or edema  NEUROLOGIC:  Alert and oriented x 3.    No  aphasia.  No dysarthria.  Affect pleasant.   GU: Foley, hematuria     Telemetry  SR today.   EKG    No new EKG to review   Labs   Basic Metabolic Panel: Recent Labs  Lab 03/09/23 0429 03/09/23 1036 03/09/23 1610 03/09/23 1614 03/10/23 0451 03/10/23 0459 03/10/23 1900 03/10/23 1910 03/11/23 0304 03/11/23 1430 03/12/23 0330 03/12/23 0627  NA 138   < > 134*   < > 133*   < > 131* 134* 131* 132* 131* 133*  K 4.8   < > 4.3   < > 4.4   < > 3.7 3.8 4.0 4.0 3.9 3.8  CL 103  --  103  --  102  --  99  --  100 96* 95*  --   CO2 22  --  19*  --  21*  --  19*  --  21* 22 23  --   GLUCOSE 92  --  124*  --  152*  --  169*  --  179* 162* 130*  --   BUN 20  --  19  --  21  --  24*  --  26* 29* 34*  --   CREATININE 1.28*  --  1.43*  --  1.50*  --  1.60*  --  1.58* 1.68*  1.72*  --   CALCIUM 8.4*  --  8.4*  --  8.5*  --  8.6*  --  8.5* 8.7* 8.7*  --   MG 2.4  --  2.4  --  2.5*  --  2.4  --  2.2  --  2.1  --   PHOS 4.6  --   --   --  3.4  --   --   --  2.2*  --  3.5  --    < > = values in this interval not displayed.    Liver Function Tests: Recent Labs  Lab 03/07/23 1500 03/09/23 0429 03/10/23 0451 03/11/23 0304 03/12/23 0330  AST 35 54* 76* 66* 53*  ALT 30 18 21 21 20   ALKPHOS 69 37* 45 53 69  BILITOT 0.9 4.2* 5.9* 3.8* 2.3*  PROT 7.4 5.5* 6.2* 6.3* 6.4*  ALBUMIN 3.4* 3.3* 3.5 3.2* 3.0*   No results for input(s): "LIPASE", "AMYLASE" in the last 168 hours. No results for input(s): "AMMONIA" in the last 168 hours.  CBC: Recent Labs  Lab 03/09/23 0429 03/09/23 1036 03/09/23 1610 03/09/23 1614 03/10/23 0451 03/10/23 0459 03/10/23 1910 03/11/23 0304 03/11/23 1751 03/12/23 0330 03/12/23 0627  WBC 12.1*  --  14.3*  --  17.4*  --   --  18.5* 16.9* 15.4*  --   NEUTROABS 9.1*  --   --   --  15.0*  --   --  16.2*  --  12.7*  --   HGB 9.4*   < > 9.1*   < > 8.5*   < > 9.2* 8.5* 8.3* 8.5* 8.5*  HCT 27.3*   < > 27.1*   < > 25.6*   < > 27.0* 25.0* 25.1* 24.1* 25.0*  MCV 91.9   --  90.9  --  91.4  --   --  90.6 95.1 90.9  --   PLT 106*  --  115*  --  121*  --   --  125* 136* 151  --    < > = values in this interval not displayed.    INR: Recent Labs  Lab 03/08/23 2015 03/09/23 0429 03/10/23 0451 03/11/23 0304 03/12/23 0330  INR 1.5* 1.6* 2.1* 1.9* 1.5*    Other results: EKG:    Imaging   DG CHEST PORT 1 VIEW  Result Date: 03/11/2023 CLINICAL DATA:  Evaluate PICC line placement. EXAM: PORTABLE CHEST 1 VIEW COMPARISON:  03/10/2023 FINDINGS: Interval placement of right arm PICC line with tip at the superior cavoatrial junction. Right IJ Cordis with Swan-Ganz catheter. Tip of the catheter is in the right lower lobar pulmonary artery. Left chest wall ICD noted with leads in the right atrial appendage and right ventricle. LVAD device identified. Stable cardiomediastinal contours. Previous median sternotomy. Interval removal of the right chest tube. No pneumothorax visualized. Small right pleural effusion with veil like opacification overlying the right mid and right lower lung. Mild interstitial prominence is stable. IMPRESSION: 1. Interval placement of right arm PICC line with tip at the superior cavoatrial junction. 2. Interval removal of right chest tube. No pneumothorax visualized. 3. Small right pleural effusion with veil like opacification overlying the right mid and right lower lung. Electronically Signed   By: Signa Kell M.D.   On: 03/11/2023 12:47   [place] EKG SITE RITE  Result Date: 03/11/2023 If Site Rite image not attached, placement could not be confirmed due to current cardiac rhythm.    Medications:  Scheduled Medications:  acetaminophen  1,000 mg Oral Q6H   Or   acetaminophen (TYLENOL) oral liquid 160 mg/5 mL  1,000 mg Per Tube Q6H   aspirin EC  81 mg Oral Daily   Or   aspirin  81 mg Per Tube Daily   Or   aspirin  150 mg Rectal Daily   atorvastatin  80 mg Oral Daily   bisacodyl  10 mg Oral Daily   Or   bisacodyl  10 mg Rectal Daily    Chlorhexidine Gluconate Cloth  6 each Topical Daily   docusate sodium  200 mg Oral Daily   feeding supplement  237 mL Oral TID BM   insulin aspart  0-24 Units Subcutaneous TID WC   metoCLOPramide (REGLAN) injection  10 mg Intravenous Q6H   multivitamin with minerals  1 tablet Oral Daily   pantoprazole  40 mg Oral Daily   polyethylene glycol  17 g Oral Daily   sodium chloride flush  10-40 mL Intracatheter Q12H   sodium chloride flush  3 mL Intravenous Q12H   tamsulosin  0.4 mg Oral QPC supper   Warfarin - Physician Dosing Inpatient   Does not apply q1600    Infusions:  sodium chloride 20 mL/hr at 03/10/23 2100   sodium chloride     amiodarone 60 mg/hr (03/12/23 0700)   lactated ringers     lactated ringers Stopped (03/10/23 0729)   milrinone 0.125 mcg/kg/min (03/12/23 0700)    PRN Medications: sodium chloride, dextrose, hydrALAZINE, morphine injection, ondansetron (ZOFRAN) IV, mouth rinse, oxyCODONE, sodium chloride flush, sodium chloride flush, traMADol   Patient Profile   Steven [name] is a 67 y.o. 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    Admitted with acute on chronic systolic and diastolic CHF with low-output. Advanced Heart Failure consulted and workup started for advanced therapies. S/p HM3 LVAD 9/12.   Assessment/Plan:    1. Acute on chronic combined systolic and diastolic heart failure>>Low Output S/p HM3 LVAD on 9/12.  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 NYHA IV on admission.  Echo 02/27/23: EF <20%, LV with GHK, RV mildly reduced, GIIDD, LA mod dilated, mild MR.  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 GDMT limited by renal function and hypotension.  S/p HM3 LVAD implant 9/12.  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 Intra-op TEE LVEF 15%, RV  normal.  CO-OX 67%. Stop milrinone.  - CVP 8-9. Stop IV lasix. Start torsemide 40 mg twice a day.  -Add 12.5 mg hydralazine tid and imdur 30   - INR 1.5. Pharmacy to start dosing coumadin.  - Continue ASA 325 until INR > 2.  2. 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  - on statin  - on ASA  3. AKI on CKD stage 3: Creatinine  1.58-->1.7   4. Mural thrombus: Warfarin.  5. H/o VT s/p Medtronic ICD: likely scar mediated - Continue amiodarone  6. Anemia: Post-op. Hgb 8.5 today.  - Transfuse hgb < 8.  7. Atrial fibrillation:  Back in SR today. Cut back amio to 30 mg per hour.  - On coumadin  8. Hematuria:  -Ongoing hematuria.  Remove foley today. INR 1. 5  Central line/Art line and foley out today.  Plan Ramp tomorrow.  PT/OT.   I reviewed the LVAD parameters from today, and compared the results to the patient's prior recorded data.  No programming changes were made.  The LVAD is functioning within specified parameters.  The patient performs LVAD self-test daily.  LVAD interrogation was negative for any significant power changes, alarms or PI events/speed drops.  LVAD equipment check completed and is in good working order.  Back-up equipment present.   LVAD education done on emergency procedures and precautions and reviewed exit site care.  Length of Stay: 14  Tonye Becket, NP 03/12/2023, 8:32 AM  VAD Team --- VAD ISSUES ONLY--- Pager [phone] (7am - 7am)  Advanced Heart Failure Team  Pager [phone] (M-F; 7a - 5p)  Please contact [facility] for night-coverage after hours (5p -7a ) and weekends on [email or web address]  Patient seen with NP, agree with the above note.   Good diuresis yesterday, I/Os net negative 2689 and weight down 8 lbs. CVP 6 on my read. Co-ox 67% on milrinone 0.125.  He is back in NSR on amiodarone 60 mg/hr.  INR down to 1.5 today.  Still with hematuria.   Doing some walking.   General: Well appearing this am. NAD.  HEENT: Normal. Neck: Supple, JVP  7-8 cm. Carotids OK.  Cardiac:  Mechanical heart sounds with LVAD hum present.  Lungs:  CTAB, normal effort.  Abdomen:  NT, ND, no HSM. No bruits or masses. +BS  LVAD exit site: Well-healed and incorporated. Dressing dry and intact. No erythema or drainage. Stabilization device present and accurately applied. Driveline dressing changed daily per sterile technique. Extremities:  Warm and dry. No cyanosis, clubbing, rash, or edema.  Neuro:  Alert & oriented x 3. Cranial nerves grossly intact. Moves all 4 extremities w/o difficulty. Affect pleasant    Stop milrinone today.  Creatinine 1.68 => 1.72 with CVP 6 and weight down.  Will give torsemide 40 mg today, no more IV Lasix for now.   Back in NSR, decrease amiodarone to 30 mg/hr.   Ongoing hematuria, discontinue foley catheter.  Will not start heparin with the hematuria but will increase warfarin given INR 1.5.   Has PICC, remove internal jugular introducer.   CRITICAL CARE Performed by: Marca Ancona  Total critical care time: 35 minutes  Critical care time was exclusive of separately billable procedures and treating other patients.  Critical care was necessary to treat or prevent imminent or life-threatening deterioration.  Critical care was time spent personally by me on the following activities: development of treatment plan with patient and/or surrogate as well as nursing, discussions with consultants, evaluation of patient's response to treatment, examination of patient, obtaining history from patient or surrogate, ordering and performing treatments and interventions, ordering and review of laboratory studies, ordering and review of radiographic studies, pulse oximetry and re-evaluation of patient's condition.  Marca Ancona 03/12/2023 9:05 AM

## 2023-03-12 NOTE — Progress Notes (Addendum)
This chaplain attempted F/U spiritual care. The chaplain understands the Pt. is participating in Pt. care at the time of the visit.   **[id number]  The Pt. accepted the chaplain's invitation for storytelling during the F/U visit. This chaplain listened reflectively as the Pt. described his motivation in the healing process. The Pt. shares hard work has contributed to his independence and self sufficiency. The Pt. describes feelings of being cooped up in the hospital and looks forward to returning to work on a limited basis and the habit of paying his bills on time.   The chaplain and Pt. affirmed the medical team's partnership and experience in the LVAD process.This chaplain is available for F/U spiritual care as needed.  Chaplain Stephanie Acre [phone]

## 2023-03-12 NOTE — Progress Notes (Addendum)
Patient ID: Steven [name], male   DOB: Aug 07, 1955, 67 y.o.   MRN: [id number] HeartMate 2 Rounding Note  Subjective:    Feels ok. Wants to start walking.  Still on milrinone 0.125 with Co-ox 67%.  -2700 cc yesterday. Creat bumped to 1.72 with diuresis.  LVAD INTERROGATION:  HeartMate II LVAD:  Flow 4.4 liters/min, speed 5400, power 3, PI 4.  No PI events yesterday.  Objective:    Vital Signs:   Temp:  [98.1 F (36.7 C)-100 F (37.8 C)] 99.1 F (37.3 C) (09/16 0630) Pulse Rate:  [72-157] 82 (09/16 0700) Resp:  [14-29] 19 (09/16 0700) SpO2:  [91 %-100 %] 100 % (09/16 0700) Arterial Line BP: (76-123)/(54-101) 102/83 (09/16 0700) Weight:  [73.5 kg] 73.5 kg (09/16 0645) Last BM Date : 03/07/23 Mean arterial Pressure 80's  Intake/Output:   Intake/Output Summary (Last 24 hours) at 03/12/2023 0829 Last data filed at 03/12/2023 0700 Gross per 24 hour  Intake 1135.12 ml  Output 3725 ml  Net -2589.88 ml     Physical Exam: General:  Well appearing. No resp difficulty HEENT: normal Neck: right internal jugular line in place Cor: distant heart sounds with LVAD hum present. Lungs: clear Abdomen: soft, nontender, non-distended. Good bowel sounds. Extremities: minimal edema Neuro: alert & orientedx3, cranial nerves grossly intact. moves all 4 extremities w/o difficulty. Affect pleasant  Telemetry: looks like sinus with V-pacing internally.  Labs: Basic Metabolic Panel: Recent Labs  Lab 03/09/23 0429 03/09/23 1036 03/09/23 1610 03/09/23 1614 03/10/23 0451 03/10/23 0459 03/10/23 1900 03/10/23 1910 03/11/23 0304 03/11/23 1430 03/12/23 0330 03/12/23 0627  NA 138   < > 134*   < > 133*   < > 131* 134* 131* 132* 131* 133*  K 4.8   < > 4.3   < > 4.4   < > 3.7 3.8 4.0 4.0 3.9 3.8  CL 103  --  103  --  102  --  99  --  100 96* 95*  --   CO2 22  --  19*  --  21*  --  19*  --  21* 22 23  --   GLUCOSE 92  --  124*  --  152*  --  169*  --  179* 162* 130*  --   BUN 20  --  19  --   21  --  24*  --  26* 29* 34*  --   CREATININE 1.28*  --  1.43*  --  1.50*  --  1.60*  --  1.58* 1.68* 1.72*  --   CALCIUM 8.4*  --  8.4*  --  8.5*  --  8.6*  --  8.5* 8.7* 8.7*  --   MG 2.4  --  2.4  --  2.5*  --  2.4  --  2.2  --  2.1  --   PHOS 4.6  --   --   --  3.4  --   --   --  2.2*  --  3.5  --    < > = values in this interval not displayed.    Liver Function Tests: Recent Labs  Lab 03/07/23 1500 03/09/23 0429 03/10/23 0451 03/11/23 0304 03/12/23 0330  AST 35 54* 76* 66* 53*  ALT 30 18 21 21 20   ALKPHOS 69 37* 45 53 69  BILITOT 0.9 4.2* 5.9* 3.8* 2.3*  PROT 7.4 5.5* 6.2* 6.3* 6.4*  ALBUMIN 3.4* 3.3* 3.5 3.2* 3.0*   No results for input(s): "  LIPASE", "AMYLASE" in the last 168 hours. No results for input(s): "AMMONIA" in the last 168 hours.  CBC: Recent Labs  Lab 03/09/23 0429 03/09/23 1036 03/09/23 1610 03/09/23 1614 03/10/23 0451 03/10/23 0459 03/10/23 1910 03/11/23 0304 03/11/23 1751 03/12/23 0330 03/12/23 0627  WBC 12.1*  --  14.3*  --  17.4*  --   --  18.5* 16.9* 15.4*  --   NEUTROABS 9.1*  --   --   --  15.0*  --   --  16.2*  --  12.7*  --   HGB 9.4*   < > 9.1*   < > 8.5*   < > 9.2* 8.5* 8.3* 8.5* 8.5*  HCT 27.3*   < > 27.1*   < > 25.6*   < > 27.0* 25.0* 25.1* 24.1* 25.0*  MCV 91.9  --  90.9  --  91.4  --   --  90.6 95.1 90.9  --   PLT 106*  --  115*  --  121*  --   --  125* 136* 151  --    < > = values in this interval not displayed.    INR: Recent Labs  Lab 03/08/23 2015 03/09/23 0429 03/10/23 0451 03/11/23 0304 03/12/23 0330  INR 1.5* 1.6* 2.1* 1.9* 1.5*    Other results: EKG:   Imaging: DG CHEST PORT 1 VIEW  Result Date: 03/11/2023 CLINICAL DATA:  Evaluate PICC line placement. EXAM: PORTABLE CHEST 1 VIEW COMPARISON:  03/10/2023 FINDINGS: Interval placement of right arm PICC line with tip at the superior cavoatrial junction. Right IJ Cordis with Swan-Ganz catheter. Tip of the catheter is in the right lower lobar pulmonary artery. Left  chest wall ICD noted with leads in the right atrial appendage and right ventricle. LVAD device identified. Stable cardiomediastinal contours. Previous median sternotomy. Interval removal of the right chest tube. No pneumothorax visualized. Small right pleural effusion with veil like opacification overlying the right mid and right lower lung. Mild interstitial prominence is stable. IMPRESSION: 1. Interval placement of right arm PICC line with tip at the superior cavoatrial junction. 2. Interval removal of right chest tube. No pneumothorax visualized. 3. Small right pleural effusion with veil like opacification overlying the right mid and right lower lung. Electronically Signed   By: Signa Kell M.D.   On: 03/11/2023 12:47   [place] EKG SITE RITE  Result Date: 03/11/2023 If Site Rite image not attached, placement could not be confirmed due to current cardiac rhythm.    Medications:     Scheduled Medications:  acetaminophen  1,000 mg Oral Q6H   Or   acetaminophen (TYLENOL) oral liquid 160 mg/5 mL  1,000 mg Per Tube Q6H   aspirin EC  81 mg Oral Daily   Or   aspirin  81 mg Per Tube Daily   Or   aspirin  150 mg Rectal Daily   atorvastatin  80 mg Oral Daily   bisacodyl  10 mg Oral Daily   Or   bisacodyl  10 mg Rectal Daily   Chlorhexidine Gluconate Cloth  6 each Topical Daily   docusate sodium  200 mg Oral Daily   feeding supplement  237 mL Oral TID BM   insulin aspart  0-24 Units Subcutaneous TID WC   metoCLOPramide (REGLAN) injection  10 mg Intravenous Q6H   multivitamin with minerals  1 tablet Oral Daily   pantoprazole  40 mg Oral Daily   polyethylene glycol  17 g Oral Daily   sodium chloride  flush  10-40 mL Intracatheter Q12H   sodium chloride flush  3 mL Intravenous Q12H   tamsulosin  0.4 mg Oral QPC supper   Warfarin - Physician Dosing Inpatient   Does not apply q1600    Infusions:  sodium chloride 20 mL/hr at 03/10/23 2100   sodium chloride     amiodarone 60 mg/hr (03/12/23  0700)   lactated ringers     lactated ringers Stopped (03/10/23 0729)   milrinone 0.125 mcg/kg/min (03/12/23 0700)    PRN Medications: sodium chloride, dextrose, hydrALAZINE, morphine injection, ondansetron (ZOFRAN) IV, mouth rinse, oxyCODONE, sodium chloride flush, sodium chloride flush, traMADol   Assessment:   POD 4 HM 3 LVAD   Acute on chronic combined systolic diastolic congestive heart failure due to ischemic cardiomyopathy with history of 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 Preop EF less than 20% with dilated LV to 7 cm. NYHA class IV CHF.   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   CAD: h/o large anterior MI 2013 treated w/ DES to LAD    H/O VT s/p Medtronic ICD. Now turned off for surgery.  Plan/Discussion:    Hemodynamics stable.   May need to slow diuresis a little.  INR down to 1.5 after 1 mg Coumadin. Will have pharmacy start dosing.  DC pocket drain.  DC arterial line and right neck line. He has need right arm PICC.  Hematuria resolved. DC foley.  Continue IS, start ambulation.  I reviewed the LVAD parameters from today, and compared the results to the patient's prior recorded data.  No programming changes were made.  The LVAD is functioning within specified parameters.   Length of Stay: [address]  Payton Doughty [facility] 03/12/2023, 8:29 AM

## 2023-03-12 NOTE — Plan of Care (Signed)
Problem: Education: Goal: Knowledge of General Education information will improve Description: Including pain rating scale, medication(s)/side effects and non-pharmacologic comfort measures Outcome: Progressing   Problem: Health Behavior/Discharge Planning: Goal: Ability to manage health-related needs will improve Outcome: Progressing   Problem: Clinical Measurements: Goal: Ability to maintain clinical measurements within normal limits will improve Outcome: Progressing Goal: Will remain free from infection Outcome: Progressing Goal: Diagnostic test results will improve Outcome: Progressing Goal: Respiratory complications will improve Outcome: Progressing Goal: Cardiovascular complication will be avoided Outcome: Progressing   Problem: Activity: Goal: Risk for activity intolerance will decrease Outcome: Progressing   Problem: Nutrition: Goal: Adequate nutrition will be maintained Outcome: Progressing   Problem: Coping: Goal: Level of anxiety will decrease Outcome: Progressing   Problem: Elimination: Goal: Will not experience complications related to bowel motility Outcome: Progressing Goal: Will not experience complications related to urinary retention Outcome: Progressing   Problem: Pain Managment: Goal: General experience of comfort will improve Outcome: Progressing   Problem: Safety: Goal: Ability to remain free from injury will improve Outcome: Progressing   Problem: Skin Integrity: Goal: Risk for impaired skin integrity will decrease Outcome: Progressing   Problem: Education: Goal: Understanding of CV disease, CV risk reduction, and recovery process will improve Outcome: Progressing Goal: Individualized Educational Video(s) Outcome: Progressing   Problem: Activity: Goal: Ability to return to baseline activity level will improve Outcome: Progressing   Problem: Cardiovascular: Goal: Ability to achieve and maintain adequate cardiovascular perfusion  will improve Outcome: Progressing Goal: Vascular access site(s) Level 0-1 will be maintained Outcome: Progressing   Problem: Health Behavior/Discharge Planning: Goal: Ability to safely manage health-related needs after discharge will improve Outcome: Progressing   Problem: Education: Goal: Understanding of CV disease, CV risk reduction, and recovery process will improve Outcome: Progressing Goal: Individualized Educational Video(s) Outcome: Progressing   Problem: Activity: Goal: Ability to return to baseline activity level will improve Outcome: Progressing   Problem: Cardiovascular: Goal: Ability to achieve and maintain adequate cardiovascular perfusion will improve Outcome: Progressing Goal: Vascular access site(s) Level 0-1 will be maintained Outcome: Progressing   Problem: Health Behavior/Discharge Planning: Goal: Ability to safely manage health-related needs after discharge will improve Outcome: Progressing

## 2023-03-13 ENCOUNTER — Inpatient Hospital Stay (HOSPITAL_COMMUNITY): Payer: 59

## 2023-03-13 DIAGNOSIS — Z95811 Presence of heart assist device: Secondary | ICD-10-CM | POA: Diagnosis not present

## 2023-03-13 DIAGNOSIS — I517 Cardiomegaly: Secondary | ICD-10-CM

## 2023-03-13 DIAGNOSIS — I361 Nonrheumatic tricuspid (valve) insufficiency: Secondary | ICD-10-CM

## 2023-03-13 DIAGNOSIS — I5023 Acute on chronic systolic (congestive) heart failure: Secondary | ICD-10-CM | POA: Diagnosis not present

## 2023-03-13 LAB — ECHOCARDIOGRAM LIMITED
Est EF: <20
Height: 69 in
Weight: 2455.04 [oz_av]

## 2023-03-13 LAB — CBC WITH DIFFERENTIAL/PLATELET
Abs Immature Granulocytes: 0.09 10*3/uL — ABNORMAL HIGH (ref 0.00–0.07)
Basophils Absolute: 0 10*3/uL (ref 0.0–0.1)
Basophils Relative: 0 %
Eosinophils Absolute: 0 10*3/uL (ref 0.0–0.5)
Eosinophils Relative: 0 %
HCT: 21.1 % — ABNORMAL LOW (ref 39.0–52.0)
Hemoglobin: 7 g/dL — ABNORMAL LOW (ref 13.0–17.0)
Immature Granulocytes: 1 %
Lymphocytes Relative: 7 %
Lymphs Abs: 0.8 10*3/uL (ref 0.7–4.0)
MCH: 30.2 pg (ref 26.0–34.0)
MCHC: 33.2 g/dL (ref 30.0–36.0)
MCV: 90.9 fL (ref 80.0–100.0)
Monocytes Absolute: 1.5 10*3/uL — ABNORMAL HIGH (ref 0.1–1.0)
Monocytes Relative: 13 %
Neutro Abs: 9.2 10*3/uL — ABNORMAL HIGH (ref 1.7–7.7)
Neutrophils Relative %: 79 %
Platelets: 152 10*3/uL (ref 150–400)
RBC: 2.32 MIL/uL — ABNORMAL LOW (ref 4.22–5.81)
RDW: 15.1 % (ref 11.5–15.5)
WBC: 11.5 10*3/uL — ABNORMAL HIGH (ref 4.0–10.5)
nRBC: 0.3 % — ABNORMAL HIGH (ref 0.0–0.2)

## 2023-03-13 LAB — COMPREHENSIVE METABOLIC PANEL
ALT: 25 U/L (ref 0–44)
AST: 51 U/L — ABNORMAL HIGH (ref 15–41)
Albumin: 2.6 g/dL — ABNORMAL LOW (ref 3.5–5.0)
Alkaline Phosphatase: 61 U/L (ref 38–126)
Anion gap: 13 (ref 5–15)
BUN: 41 mg/dL — ABNORMAL HIGH (ref 8–23)
CO2: 27 mmol/L (ref 22–32)
Calcium: 8.3 mg/dL — ABNORMAL LOW (ref 8.9–10.3)
Chloride: 90 mmol/L — ABNORMAL LOW (ref 98–111)
Creatinine, Ser: 1.76 mg/dL — ABNORMAL HIGH (ref 0.61–1.24)
GFR, Estimated: 42 mL/min — ABNORMAL LOW (ref >60)
Glucose, Bld: 225 mg/dL — ABNORMAL HIGH (ref 70–99)
Potassium: 3.2 mmol/L — ABNORMAL LOW (ref 3.5–5.1)
Sodium: 130 mmol/L — ABNORMAL LOW (ref 135–145)
Total Bilirubin: 1.8 mg/dL — ABNORMAL HIGH (ref 0.3–1.2)
Total Protein: 5.7 g/dL — ABNORMAL LOW (ref 6.5–8.1)

## 2023-03-13 LAB — GLUCOSE, CAPILLARY
Glucose-Capillary: 110 mg/dL — ABNORMAL HIGH (ref 70–99)
Glucose-Capillary: 134 mg/dL — ABNORMAL HIGH (ref 70–99)
Glucose-Capillary: 141 mg/dL — ABNORMAL HIGH (ref 70–99)
Glucose-Capillary: 189 mg/dL — ABNORMAL HIGH (ref 70–99)
Glucose-Capillary: 197 mg/dL — ABNORMAL HIGH (ref 70–99)
Glucose-Capillary: 96 mg/dL (ref 70–99)

## 2023-03-13 LAB — BASIC METABOLIC PANEL
Anion gap: 14 (ref 5–15)
BUN: 43 mg/dL — ABNORMAL HIGH (ref 8–23)
CO2: 24 mmol/L (ref 22–32)
Calcium: 7.9 mg/dL — ABNORMAL LOW (ref 8.9–10.3)
Chloride: 93 mmol/L — ABNORMAL LOW (ref 98–111)
Creatinine, Ser: 1.74 mg/dL — ABNORMAL HIGH (ref 0.61–1.24)
GFR, Estimated: 42 mL/min — ABNORMAL LOW (ref >60)
Glucose, Bld: 229 mg/dL — ABNORMAL HIGH (ref 70–99)
Potassium: 3.7 mmol/L (ref 3.5–5.1)
Sodium: 131 mmol/L — ABNORMAL LOW (ref 135–145)

## 2023-03-13 LAB — LACTATE DEHYDROGENASE: LDH: 274 U/L — ABNORMAL HIGH (ref 98–192)

## 2023-03-13 LAB — HEMOGLOBIN AND HEMATOCRIT, BLOOD
HCT: 24.2 % — ABNORMAL LOW (ref 39.0–52.0)
Hemoglobin: 8 g/dL — ABNORMAL LOW (ref 13.0–17.0)

## 2023-03-13 LAB — COOXEMETRY PANEL
Carboxyhemoglobin: 1.7 % — ABNORMAL HIGH (ref 0.5–1.5)
Methemoglobin: <0.7 % (ref 0.0–1.5)
O2 Saturation: 62.4 %
Total hemoglobin: 7.9 g/dL — ABNORMAL LOW (ref 12.0–16.0)

## 2023-03-13 LAB — PHOSPHORUS: Phosphorus: 4 mg/dL (ref 2.5–4.6)

## 2023-03-13 LAB — POCT I-STAT 7, (LYTES, BLD GAS, ICA,H+H)
Acid-base deficit: 2 mmol/L (ref 0.0–2.0)
Bicarbonate: 21 mmol/L (ref 20.0–28.0)
Calcium, Ion: 1.14 mmol/L — ABNORMAL LOW (ref 1.15–1.40)
HCT: 25 % — ABNORMAL LOW (ref 39.0–52.0)
Hemoglobin: 8.5 g/dL — ABNORMAL LOW (ref 13.0–17.0)
O2 Saturation: 97 %
Patient temperature: 36.9
Potassium: 4 mmol/L (ref 3.5–5.1)
Sodium: 134 mmol/L — ABNORMAL LOW (ref 135–145)
TCO2: 22 mmol/L (ref 22–32)
pCO2 arterial: 27.1 mmHg — ABNORMAL LOW (ref 32–48)
pH, Arterial: 7.497 — ABNORMAL HIGH (ref 7.35–7.45)
pO2, Arterial: 79 mmHg — ABNORMAL LOW (ref 83–108)

## 2023-03-13 LAB — MAGNESIUM: Magnesium: 2 mg/dL (ref 1.7–2.4)

## 2023-03-13 LAB — PROTIME-INR
INR: 1.6 — ABNORMAL HIGH (ref 0.8–1.2)
Prothrombin Time: 18.8 s — ABNORMAL HIGH (ref 11.4–15.2)

## 2023-03-13 LAB — PREPARE RBC (CROSSMATCH)

## 2023-03-13 MED ORDER — POTASSIUM CHLORIDE CRYS ER 20 MEQ PO TBCR
40.0000 meq | EXTENDED_RELEASE_TABLET | ORAL | Status: AC
  Administered 2023-03-13 (×2): 40 meq via ORAL
  Filled 2023-03-13 (×2): qty 2

## 2023-03-13 MED ORDER — HYDRALAZINE HCL 25 MG PO TABS
25.0000 mg | ORAL_TABLET | Freq: Three times a day (TID) | ORAL | Status: DC
  Administered 2023-03-13: 25 mg via ORAL
  Filled 2023-03-13: qty 1

## 2023-03-13 MED ORDER — SODIUM CHLORIDE 0.9% IV SOLUTION: Freq: Once | INTRAVENOUS | Status: AC

## 2023-03-13 MED ORDER — AMIODARONE LOAD VIA INFUSION
150.0000 mg | Freq: Once | INTRAVENOUS | Status: AC
  Administered 2023-03-13: 150 mg via INTRAVENOUS
  Filled 2023-03-13: qty 83.34

## 2023-03-13 MED ORDER — TORSEMIDE 20 MG PO TABS
20.0000 mg | ORAL_TABLET | Freq: Every day | ORAL | Status: DC
  Administered 2023-03-13 – 2023-03-17 (×5): 20 mg via ORAL
  Filled 2023-03-13 (×6): qty 1

## 2023-03-13 MED ORDER — FINASTERIDE 5 MG PO TABS
5.0000 mg | ORAL_TABLET | Freq: Every day | ORAL | Status: DC
  Administered 2023-03-13 – 2023-03-20 (×8): 5 mg via ORAL
  Filled 2023-03-13 (×8): qty 1

## 2023-03-13 MED ORDER — HYDRALAZINE HCL 50 MG PO TABS
50.0000 mg | ORAL_TABLET | Freq: Three times a day (TID) | ORAL | Status: DC
  Administered 2023-03-13 – 2023-03-14 (×2): 50 mg via ORAL
  Filled 2023-03-13 (×2): qty 1

## 2023-03-13 MED ORDER — WARFARIN SODIUM 2.5 MG PO TABS
2.5000 mg | ORAL_TABLET | Freq: Once | ORAL | Status: AC
  Administered 2023-03-13: 2.5 mg via ORAL
  Filled 2023-03-13: qty 1

## 2023-03-13 MED ORDER — POTASSIUM CHLORIDE CRYS ER 20 MEQ PO TBCR
20.0000 meq | EXTENDED_RELEASE_TABLET | ORAL | Status: AC
  Administered 2023-03-13 – 2023-03-14 (×3): 20 meq via ORAL
  Filled 2023-03-13 (×3): qty 1

## 2023-03-13 NOTE — Progress Notes (Signed)
Inpatient Rehab Coordinator Note:  I met with patient at bedside to discuss CIR recommendations and goals/expectations of CIR stay.  We reviewed 3 hrs/day of therapy, physician follow up, and average length of stay 2 weeks (dependent upon progress) with goals of supervision to mod I.  Note per chart pt's preferred caregiver is Army Chaco (ex-partner), and LVAD CSW has discussed 24/7 caregiver support with her.  I left a message to answer questions regarding rehab process.  We will need insurance approval.  Still requiring IV amio so will wait for that to begin titrating down.  Will follow.   Estill Dooms, PT, DPT Admissions Coordinator [phone] 03/13/23  1:19 PM

## 2023-03-13 NOTE — Progress Notes (Signed)
Speed  Flow  PI  Power  LVIDD  AI  Aortic opening MR  TR  Septum  RV  VTI (>18cm)  5400 4.3 3.2 3.8 4 triv 0/5  triv  mild Slight bow to right Mild/mod  10.1   5500 4.4 3.1 4  triv 0/5  mild midline Mild/mod 12.8                                                           Doppler MAP: 80 Auto cuff BP:  98/71 (81)   Ramp ECHO performed at bedside per Dr Shirlee Latch  At completion of ramp study, patients primary controller programmed:  Fixed speed: 5500 Low speed limit: 5200    Carlton Adam RN, VAD Coordinator 24/7 pager [phone]

## 2023-03-13 NOTE — Progress Notes (Signed)
VAD coordinator and Dr. Shirlee Latch made aware of patient being hypertensive.  Cuff MAP 97. Doppler MAP 108. Flow 4.2. PI 3.6.   Hydralazine adjusted accordingly.  RN will continue to monitor patient closely.

## 2023-03-13 NOTE — Progress Notes (Signed)
Occupational Therapy Treatment Patient Details Name: Steven [name] MRN: [id number] DOB: 1956/03/01 Today's Date: 03/13/2023   History of present illness Pt is 67 yo male admitted on 02/26/23 for acute on chronic systolic and diastolic CHF with low output.  S/P implantation of LVAD Heartmate 3  (9/12). PMHx includes but not limited to ICM, CAD, VT s/p medtronic ICD, HLD, apical mural thrombus, CKD, subdural hematoma.   OT comments  Pt progressing towards goals this session, reviewed sternal precautions and pt able to adhere to precautions during session. Pt needing min A for LB ADL, and simulated toilet transfer to chair. Pt able to complete seated groomign task with set up A. Educated pt on UE therex (within limits of sternal precautions) at chair level. HR ranging 87-160 during session, BP 89/75 (81) once seated in chair, RN notified. Pt presenting with impairments listed below, will follow acutely. Patient will benefit from intensive inpatient follow up therapy, >3 hours/day to maximize safety/ind with ADLs/functional mobility.  PF 4.6 PS 5500 PI 2.7 PP 4.0 No alarming of system noted throughout session       If plan is discharge home, recommend the following:  Two people to help with walking and/or transfers;A lot of help with bathing/dressing/bathroom;Assistance with cooking/housework;Assist for transportation;Help with stairs or ramp for entrance   Equipment Recommendations  Other (comment) (defer)    Recommendations for Other Services PT consult;Rehab consult    Precautions / Restrictions Precautions Precautions: Sternal;Other (comment) (LVAD) Restrictions Other Position/Activity Restrictions: sternal prec       Mobility Bed Mobility Overal bed mobility: Needs Assistance Bed Mobility: Sidelying to Sit   Sidelying to sit: Mod assist       General bed mobility comments: mod A for trunk elevation    Transfers Overall transfer level: Needs assistance Equipment used: 1  person hand held assist Transfers: Sit to/from Stand, Bed to chair/wheelchair/BSC Sit to Stand: Min assist     Step pivot transfers: Min assist     General transfer comment: use of heart pillow     Balance Overall balance assessment: Needs assistance Sitting-balance support: No upper extremity supported, Feet supported Sitting balance-Leahy Scale: Good Sitting balance - Comments: no external support seated EOB   Standing balance support: During functional activity, Reliant on assistive device for balance Standing balance-Leahy Scale: Poor Standing balance comment: reliant on external support                           ADL either performed or assessed with clinical judgement   ADL Overall ADL's : Needs assistance/impaired     Grooming: Wash/dry face;Sitting               Lower Body Dressing: Minimal assistance;Sitting/lateral leans Lower Body Dressing Details (indicate cue type and reason): figure 4 to don/doff socks Toilet Transfer: Minimal assistance;Stand-pivot;BSC/3in1 Statistician Details (indicate cue type and reason): simulated to chair         Functional mobility during ADLs: Minimal assistance      Extremity/Trunk Assessment Upper Extremity Assessment Upper Extremity Assessment: Generalized weakness   Lower Extremity Assessment Lower Extremity Assessment: Defer to PT evaluation        Vision       Perception Perception Perception: Not tested   Praxis Praxis Praxis: Not tested    Cognition Arousal: Alert Behavior During Therapy: [facility] for tasks assessed/performed, Flat affect Overall Cognitive Status: Within Functional Limits for tasks assessed  Exercises Other Exercises Other Exercises: seated BUE forward shoulder flexion x5 Other Exercises: seated BUE shoulder external rotation with elbow at side x5    Shoulder Instructions       General Comments HR 87-160  intermittently during session, see note for LVAD metrics    Pertinent Vitals/ Pain       Pain Assessment Pain Assessment: No/denies pain  Home Living                                          Prior Functioning/Environment              Frequency  Min 1X/week        Progress Toward Goals  OT Goals(current goals can now be found in the care plan section)  Progress towards OT goals: Progressing toward goals  Acute Rehab OT Goals Patient Stated Goal: none stated OT Goal Formulation: With patient Time For Goal Achievement: 03/24/23 Potential to Achieve Goals: Good ADL Goals Pt Will Perform Upper Body Dressing: with contact guard assist;sitting Pt Will Perform Lower Body Dressing: with min assist;sit to/from stand;sitting/lateral leans Pt Will Transfer to Toilet: with min assist;ambulating;regular height toilet Additional ADL Goal #1: pt will perform bed mobility with supervision in prep for ADLs  Plan      Co-evaluation                 AM-PAC OT "6 Clicks" Daily Activity     Outcome Measure   Help from another person eating meals?: A Little Help from another person taking care of personal grooming?: A Little Help from another person toileting, which includes using toliet, bedpan, or urinal?: A Lot Help from another person bathing (including washing, rinsing, drying)?: A Lot Help from another person to put on and taking off regular upper body clothing?: A Little Help from another person to put on and taking off regular lower body clothing?: A Little 6 Click Score: 16    End of Session    OT Visit Diagnosis: Unsteadiness on feet (R26.81);Other abnormalities of gait and mobility (R26.89);Muscle weakness (generalized) (M62.81)   Activity Tolerance Patient tolerated treatment well   Patient Left in chair;with call bell/phone within reach   Nurse Communication Mobility status        Time: 6237-6283 OT Time Calculation (min): 32  min  Charges: OT General Charges $OT Visit: 1 Visit OT Treatments $Self Care/Home Management : 8-22 mins $Therapeutic Activity: 8-22 mins  Carver Fila, OTD, OTR/L SecureChat Preferred Acute Rehab [phone]   Carver Fila Koonce 03/13/2023, 12:07 PM

## 2023-03-13 NOTE — Progress Notes (Signed)
Patient ID: Steven [name], male   DOB: [birth year], 67 y.o.   MRN: [id number] HeartMate 3 Rounding Note  Subjective:    Feels ok today. Ambulated yesterday. Had small BM. Has had some more hematuria.  Co-ox 62.4 off inotropes. CVP 8 through PICC. -1464 cc yesterday. Wt down 9 lbs if accurate and slightly below preop wt.   LVAD INTERROGATION:  HeartMate IIl LVAD:  Flow 3.7 liters/min, speed 5400, power 4, PI 6.    Objective:    Vital Signs:   Temp:  [97.7 F (36.5 C)-98.1 F (36.7 C)] 98.1 F (36.7 C) (09/17 0300) Pulse Rate:  [74-90] 81 (09/17 0600) Resp:  [14-29] 19 (09/17 0700) BP: (90-126)/(67-92) 115/92 (09/17 0400) SpO2:  [89 %-100 %] 100 % (09/17 0600) Arterial Line BP: (89-143)/(67-92) 126/79 (09/16 1700) Weight:  [69.6 kg] 69.6 kg (09/17 0500) Last BM Date : 03/12/23 Mean arterial Pressure 80-100  Intake/Output:   Intake/Output Summary (Last 24 hours) at 03/13/2023 0854 Last data filed at 03/13/2023 0500 Gross per 24 hour  Intake 374.27 ml  Output 1875 ml  Net -1500.73 ml     Physical Exam: General:  Well appearing. No resp difficulty HEENT: normal Cor: distant heart sounds with LVAD hum present. Lungs: clear Abdomen: soft, nontender, nondistended. Good bowel sounds. Extremities: no edema Neuro: alert & orientedx3, cranial nerves grossly intact. moves all 4 extremities w/o difficulty. Affect pleasant  Telemetry: atrial fib low 100's this am on IV amio. He was sinus with V-pacing yesterday.  Labs: Basic Metabolic Panel: Recent Labs  Lab 03/09/23 0429 03/09/23 1036 03/10/23 0451 03/10/23 0459 03/10/23 1900 03/10/23 1910 03/11/23 0304 03/11/23 1430 03/12/23 0330 03/12/23 0627 03/13/23 0433  NA 138   < > 133*   < > 131*   < > 131* 132* 131* 133* 130*  K 4.8   < > 4.4   < > 3.7   < > 4.0 4.0 3.9 3.8 3.2*  CL 103   < > 102  --  99  --  100 96* 95*  --  90*  CO2 22   < > 21*  --  19*  --  21* 22 23  --  27  GLUCOSE 92   < > 152*  --  169*  --  179*  162* 130*  --  225*  BUN 20   < > 21  --  24*  --  26* 29* 34*  --  41*  CREATININE 1.28*   < > 1.50*  --  1.60*  --  1.58* 1.68* 1.72*  --  1.76*  CALCIUM 8.4*   < > 8.5*  --  8.6*  --  8.5* 8.7* 8.7*  --  8.3*  MG 2.4   < > 2.5*  --  2.4  --  2.2  --  2.1  --  2.0  PHOS 4.6  --  3.4  --   --   --  2.2*  --  3.5  --  4.0   < > = values in this interval not displayed.    Liver Function Tests: Recent Labs  Lab 03/09/23 0429 03/10/23 0451 03/11/23 0304 03/12/23 0330 03/13/23 0433  AST 54* 76* 66* 53* 51*  ALT 18 21 21 20 25   ALKPHOS 37* 45 53 69 61  BILITOT 4.2* 5.9* 3.8* 2.3* 1.8*  PROT 5.5* 6.2* 6.3* 6.4* 5.7*  ALBUMIN 3.3* 3.5 3.2* 3.0* 2.6*   No results for input(s): "LIPASE", "AMYLASE" in the last 168  hours. No results for input(s): "AMMONIA" in the last 168 hours.  CBC: Recent Labs  Lab 03/09/23 0429 03/09/23 1036 03/10/23 0451 03/10/23 0459 03/11/23 0304 03/11/23 1751 03/12/23 0330 03/12/23 0627 03/13/23 0433  WBC 12.1*   < > 17.4*  --  18.5* 16.9* 15.4*  --  11.5*  NEUTROABS 9.1*  --  15.0*  --  16.2*  --  12.7*  --  9.2*  HGB 9.4*   < > 8.5*   < > 8.5* 8.3* 8.5* 8.5* 7.0*  HCT 27.3*   < > 25.6*   < > 25.0* 25.1* 24.1* 25.0* 21.1*  MCV 91.9   < > 91.4  --  90.6 95.1 90.9  --  90.9  PLT 106*   < > 121*  --  125* 136* 151  --  152   < > = values in this interval not displayed.    INR: Recent Labs  Lab 03/09/23 0429 03/10/23 0451 03/11/23 0304 03/12/23 0330 03/13/23 0433  INR 1.6* 2.1* 1.9* 1.5* 1.6*    Other results: EKG:   Imaging: DG CHEST PORT 1 VIEW  Result Date: 03/11/2023 CLINICAL DATA:  Evaluate PICC line placement. EXAM: PORTABLE CHEST 1 VIEW COMPARISON:  03/10/2023 FINDINGS: Interval placement of right arm PICC line with tip at the superior cavoatrial junction. Right IJ Cordis with Swan-Ganz catheter. Tip of the catheter is in the right lower lobar pulmonary artery. Left chest wall ICD noted with leads in the right atrial appendage and  right ventricle. LVAD device identified. Stable cardiomediastinal contours. Previous median sternotomy. Interval removal of the right chest tube. No pneumothorax visualized. Small right pleural effusion with veil like opacification overlying the right mid and right lower lung. Mild interstitial prominence is stable. IMPRESSION: 1. Interval placement of right arm PICC line with tip at the superior cavoatrial junction. 2. Interval removal of right chest tube. No pneumothorax visualized. 3. Small right pleural effusion with veil like opacification overlying the right mid and right lower lung. Electronically Signed   By: Signa Kell M.D.   On: 03/11/2023 12:47     Medications:     Scheduled Medications:  acetaminophen  1,000 mg Oral Q6H   Or   acetaminophen (TYLENOL) oral liquid 160 mg/5 mL  1,000 mg Per Tube Q6H   amiodarone  150 mg Intravenous Once   aspirin EC  81 mg Oral Daily   Or   aspirin  81 mg Per Tube Daily   Or   aspirin  150 mg Rectal Daily   atorvastatin  80 mg Oral Daily   bisacodyl  10 mg Oral Daily   Or   bisacodyl  10 mg Rectal Daily   Chlorhexidine Gluconate Cloth  6 each Topical Daily   docusate sodium  200 mg Oral Daily   feeding supplement  237 mL Oral TID BM   hydrALAZINE  12.5 mg Oral Q8H   insulin aspart  0-24 Units Subcutaneous TID WC   isosorbide mononitrate  30 mg Oral Daily   metoCLOPramide (REGLAN) injection  10 mg Intravenous Q6H   multivitamin with minerals  1 tablet Oral Daily   pantoprazole  40 mg Oral Daily   polyethylene glycol  17 g Oral Daily   potassium chloride  40 mEq Oral Q4H   sodium chloride flush  10-40 mL Intracatheter Q12H   sodium chloride flush  3 mL Intravenous Q12H   tamsulosin  0.4 mg Oral QPC supper   warfarin  2.5 mg Oral ONCE-1600  Warfarin - Physician Dosing Inpatient   Does not apply q1600    Infusions:  sodium chloride 20 mL/hr at 03/10/23 2100   sodium chloride     amiodarone 30 mg/hr (03/13/23 0500)   lactated  ringers     lactated ringers Stopped (03/10/23 0729)    PRN Medications: sodium chloride, dextrose, morphine injection, ondansetron (ZOFRAN) IV, mouth rinse, oxyCODONE, sodium chloride flush, sodium chloride flush, traMADol   Assessment:   POD 5 HM 3 LVAD   Acute on chronic combined systolic diastolic congestive heart failure due to ischemic cardiomyopathy with history of 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 Preop EF less than 20% with dilated LV to 7 cm. NYHA class IV CHF.   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   CAD: h/o large anterior MI 2013 treated w/ DES to LAD    H/O VT s/p Medtronic ICD. Now turned off for surgery.    Plan/Discussion:    Hemodynamically stable  Atrial fib on amio. Continue. May need additional bolus.  INR slightly higher today at 1.6. Coumadin per pharmacy.  Recurrent hematuria: planning urology consult. Hgb trending down from 8.5 to 7. Planning transfusion today.   Creat trending up to 1.7 likely related to diuresis. He is below preop if accurate.  Continue IS, ambulation.  Plan transfer to [facility] per AHF team.  Ramp echo per AHF.  I reviewed the LVAD parameters from today, and compared the results to the patient's prior recorded data.  No programming changes were made.  The LVAD is functioning within specified parameters.   Length of Stay: [address]  [facility] 03/13/2023, 8:54 AM

## 2023-03-13 NOTE — Progress Notes (Signed)
Patient ID: Steven [name], male   DOB: December 15, 1955, 67 y.o.   MRN: [id number] TCTS Evening Rounds:  Hemodynamically stable today. LVAD parameters stable.  Seen by urology and plan is for continued observation of hematuria which is likely related to foley irritation of prostatic urethra with hx of prostate cancer.  Transfused one unit today.  Ambulated 300 ft.

## 2023-03-13 NOTE — Progress Notes (Addendum)
ANTICOAGULATION CONSULT NOTE - Initial Consult  Pharmacy Consult for warfarin management Indication:  LVAD  No Known Allergies  Patient Measurements: Height: 5\' 9"  (175.3 cm) Weight: 69.6 kg (153 lb 7 oz) IBW/kg (Calculated) : 70.7 Heparin Dosing Weight: 73.5 kg  Vital Signs: Temp: 98.1 F (36.7 C) (09/17 0300) Temp Source: Oral (09/17 0300) BP: 115/92 (09/17 0400) Pulse Rate: 81 (09/17 0600)  Labs: Recent Labs    03/11/23 0304 03/11/23 1430 03/11/23 1751 03/12/23 0330 03/12/23 0627 03/13/23 0433  HGB 8.5*  --  8.3* 8.5* 8.5* 7.0*  HCT 25.0*  --  25.1* 24.1* 25.0* 21.1*  PLT 125*  --  136* 151  --  152  LABPROT 21.8*  --   --  18.7*  --  18.8*  INR 1.9*  --   --  1.5*  --  1.6*  CREATININE 1.58* 1.68*  --  1.72*  --  1.76*    Estimated Creatinine Clearance: 39.5 mL/min (A) (by C-G formula based on SCr of 1.76 mg/dL (H)).   Medical History: Past Medical History:  Diagnosis Date   Acute MI, anterior wall (HCC)    AICD (automatic cardioverter/defibrillator) present    CAD (coronary artery disease)    2D ECHO, 07/13/2011 - EF <25%, LV moderatelty dilated, LA moderately dilatedLEXISCAN, 12/14/2011 - moderate-severe perfusion defect seen in the basal anteroseptal, mid anterior, apicacl anterior, apical, apical inferior, and apical lateral regions, post-stress EF 25%, new EKG changes from baseline abnormalities   Cancer (HCC)    Prostate   CHF (congestive heart failure) (HCC) 2012   Hypertension 08/08/2021   Inguinal hernia, left    Pneumonia    November 2023   Pre-diabetes     Medications:  Medications Prior to Admission  Medication Sig Dispense Refill Last Dose   acetaminophen (TYLENOL) 500 MG tablet Take 1,000 mg by mouth every 6 (six) hours as needed for mild pain.   Past Week   amiodarone (PACERONE) 200 MG tablet Take 2 tablets (400 mg total) by mouth 2 (two) times daily for 14 days, THEN 1 tablet (200 mg total) 2 (two) times daily for 14 days. 84 tablet 0  02/25/2023 at am   atorvastatin (LIPITOR) 80 MG tablet Take 1 tablet (80 mg total) by mouth daily. 30 tablet 3 02/25/2023 at am   Cholecalciferol (VITAMIN D) 125 MCG (5000 UT) CAPS Take 5,000 Units by mouth daily.   02/25/2023 at am   dapagliflozin propanediol (FARXIGA) 10 MG TABS tablet Take 1 tablet (10 mg total) by mouth daily. 30 tablet 3 02/25/2023 at am   metoprolol succinate (TOPROL-XL) 25 MG 24 hr tablet Take 1 tablet (25 mg total) by mouth daily. 30 tablet 2 02/25/2023 at am   Multiple Vitamin (MULTIVITAMIN) tablet Take 1 tablet by mouth daily.   02/25/2023 at am   nitroGLYCERIN (NITROSTAT) 0.4 MG SL tablet Place 1 tablet (0.4 mg total) under the tongue every 5 (five) minutes as needed. Usual dose is q 5 minutes x 3 doses. 75 tablet 0 02/26/2023   potassium chloride SA (KLOR-CON M) 20 MEQ tablet Take 1 tablet (20 mEq total) by mouth daily. 30 tablet 2 02/25/2023 at am   sacubitril-valsartan (ENTRESTO) 24-26 MG Take 1 tablet by mouth 2 (two) times daily. 60 tablet 3 02/25/2023 at am   spironolactone (ALDACTONE) 25 MG tablet Take 0.5 tablets (12.5 mg total) by mouth every other day. TAKE EOD ON EVEN DAYS 15 tablet 6 02/24/2023 at am   tamsulosin (FLOMAX) 0.4 MG  CAPS capsule Take 0.4 mg by mouth in the morning and at bedtime.   02/25/2023 at am   torsemide (DEMADEX) 20 MG tablet Take 1 tablet by mouth every other day. 45 tablet 3 02/24/2023 at am   warfarin (COUMADIN) 5 MG tablet Take 1 tablet daily except 1/2 tablet on Wednesdays and Fridays or as directed. (Patient taking differently: Take 2.5-5 mg by mouth See admin instructions. Take 1 tablet by mouth every Tue and Thur, then take 1/2 tablet all other days) 90 tablet 1 02/25/2023 at 1230   ZENPEP 40000-126000 units CPEP Take 1 capsule by mouth 3 (three) times daily with meals.   02/25/2023 at pm   amiodarone (PACERONE) 200 MG tablet Take 1 tablet (200 mg) by mouth daily. 30 tablet 5    Scheduled:   acetaminophen  1,000 mg Oral Q6H   Or   acetaminophen (TYLENOL)  oral liquid 160 mg/5 mL  1,000 mg Per Tube Q6H   aspirin EC  81 mg Oral Daily   Or   aspirin  81 mg Per Tube Daily   Or   aspirin  150 mg Rectal Daily   atorvastatin  80 mg Oral Daily   bisacodyl  10 mg Oral Daily   Or   bisacodyl  10 mg Rectal Daily   Chlorhexidine Gluconate Cloth  6 each Topical Daily   docusate sodium  200 mg Oral Daily   feeding supplement  237 mL Oral TID BM   hydrALAZINE  12.5 mg Oral Q8H   insulin aspart  0-24 Units Subcutaneous TID WC   isosorbide mononitrate  30 mg Oral Daily   metoCLOPramide (REGLAN) injection  10 mg Intravenous Q6H   multivitamin with minerals  1 tablet Oral Daily   pantoprazole  40 mg Oral Daily   polyethylene glycol  17 g Oral Daily   potassium chloride  40 mEq Oral Q4H   sodium chloride flush  10-40 mL Intracatheter Q12H   sodium chloride flush  3 mL Intravenous Q12H   tamsulosin  0.4 mg Oral QPC supper   warfarin  2.5 mg Oral ONCE-1600   Warfarin - Physician Dosing Inpatient   Does not apply q1600   Infusions:   sodium chloride 20 mL/hr at 03/10/23 2100   sodium chloride     amiodarone 30 mg/hr (03/13/23 0500)   lactated ringers     lactated ringers Stopped (03/10/23 0729)   PRN: sodium chloride, dextrose, morphine injection, ondansetron (ZOFRAN) IV, mouth rinse, oxyCODONE, sodium chloride flush, sodium chloride flush, traMADol  Assessment: Pt presented to [facility] ED on 02/26/2023 with shortness of breath and SpO2 of 78 on room air. Pt has PMH of ICM (LAD infarct), HFrEF (EF <20%), LVT, VT s/p ICD, HTN, CKD3, HLD  Pt underwent LVAD implantation on 9/12. Anticoagulation was held immediately post-procedure due to risk of major bleeding events. Warfarin was initiation 9/15, with 1 dose of 1 mg given. Pt's INR is subtherapeutic but is now increasing at an appropriate rate (1.9 >1.5 >1.6). Pt's H/H is slightly decreasing post-VAD (8.5 >7.0). Pt is till having some hematuria that is being monitored. Pt is on an oral diet but with reduced  appetite at this time.  Goal of Therapy:  2.0-2.5 Monitor platelets by anticoagulation protocol: Yes   Plan:  Give warfarin 2.5 mg x1 Monitor daily INR, CBC, diet improvement, and s/sx of bleeding   Wilmer Floor, PharmD PGY2 Cardiology Pharmacy Resident 03/13/2023,8:26 AM

## 2023-03-13 NOTE — Consult Note (Signed)
Urology Consult Note   Requesting Attending Physician:  Steven Nettles, DO Service Providing Consult: Urology  Consulting Attending: Dr.Manny   Reason for Consult:  Hematuria  HPI: Steven [name] is seen in consultation for reasons noted above at the request of [name], [name], DO.  Patient is known to our practice has been followed by Dr. Mena [name] for years.  Diagnosed with Gleason 4+3=7 prostate cancer, not amenable to surgery.  Patient completed XRT 08/2021.  Patient underwent implantable left ventricular assist device on 03/08/2023.  He has been systemically anticoagulated.  3 days later he began experiencing hematuria.  While this has improved, primary team was concerned that it had not resolved.  He is unable to come off of his anticoagulation regimen.  ------------------  Assessment:  67 y.o. male with hematuria on systemic anticoagulation. S/p LVAD placement in CVICU   Recommendations: # Hematuria # Hx of prostate cancer  CT A/P on 03/01/2023 does not note any ureteral calculi, obstructive uropathy, or distinct masses.  There is some bilateral cortical thinning noted, I do not appreciate independent review.  Patient's renal function has not returned to baseline postoperatively.  When appropriate he would need to complete a CT A/P with contrast as part of a hematuria workup.  He can follow-up outpatient with our office and we will schedule him for cystoscopy with his usual urologist.  Considering his systemic anticoagulation, prostatomegaly, and his radiation of his prostate, it is most likely that he had a mild amount of prostatic trauma from his catheter placement in the OR.  His level of bleeding is not significant and per his surgeon, it has improved as of today.  Considering risk versus benefit of placing a larger three-way catheter for continuous bladder irrigation, I would forego that at this time.  He is likely to suffer more of the same type of trauma to the prostate.  We  do not want to create a situation where he needs to be taken to the operating room for fulguration.  Will follow along peripherally.  Please call if hematuria worsens significantly or clot obstruction of urine is suspected.  Case and plan discussed with Dr. Berneice Heinrich  Past Medical History: Past Medical History:  Diagnosis Date   Acute MI, anterior wall (HCC)    AICD (automatic cardioverter/defibrillator) present    CAD (coronary artery disease)    2D ECHO, 07/13/2011 - EF <25%, LV moderatelty dilated, LA moderately dilatedLEXISCAN, 12/14/2011 - moderate-severe perfusion defect seen in the basal anteroseptal, mid anterior, apicacl anterior, apical, apical inferior, and apical lateral regions, post-stress EF 25%, new EKG changes from baseline abnormalities   Cancer [name])    Prostate   CHF (congestive heart failure) (HCC) 2012   Hypertension 08/08/2021   Inguinal hernia, left    Pneumonia    November 2023   Pre-diabetes     Past Surgical History:  Past Surgical History:  Procedure Laterality Date   CARDIAC CATHETERIZATION  11/25/2010   Predilation balloon-Apex monorail 2x59mm, Cutting balloon-2.25x50mm, resulting in a reduction of 100% stenosis down to less than 10%   CARDIAC CATHETERIZATION  07/12/2010   LAD stented with a 3.5x9mm bare-metal Veriflex stent resulting in a reduction of 100% lesion to 0%   CARDIAC DEFIBRILLATOR PLACEMENT  03/02/2011   Medtronic Protecta XT DR, model #Z610RUE, serial #[id number]   COLONOSCOPY     GOLD SEED IMPLANT N/A 07/21/2021   Procedure: GOLD SEED IMPLANT;  Surgeon: Steven Purpura, MD;  Location: [facility];  Service: Urology;  Laterality: N/A;  NEEDS 30 MIN   HERNIA REPAIR  2018   ICD GENERATOR CHANGEOUT N/A 02/24/2019   Procedure: ICD GENERATOR CHANGEOUT;  Surgeon: Steven Fair, MD;  Location: [facility];  Service: Cardiovascular;  Laterality: N/A;   INSERTION OF IMPLANTABLE LEFT VENTRICULAR ASSIST DEVICE N/A 03/08/2023   Procedure: INSERTION  OF IMPLANTABLE LEFT VENTRICULAR ASSIST DEVICE;  Surgeon: Alleen Borne, MD;  Location: [facility] OR;  Service: Open Heart Surgery;  Laterality: N/A;   PACEMAKER PLACEMENT  07/14/2010   Temporary placement of pacemaker, if rhythm issue continues will need a permanent device   RIGHT HEART CATH N/A 02/28/2023   Procedure: RIGHT HEART CATH;  Surgeon: Steven Ouch, MD;  Location: [facility];  Service: Cardiovascular;  Laterality: N/A;   RIGHT HEART CATH N/A 03/06/2023   Procedure: RIGHT HEART CATH;  Surgeon: Steven Nettles, DO;  Location: [facility];  Service: Cardiovascular;  Laterality: N/A;   SPACE OAR INSTILLATION N/A 07/21/2021   Procedure: SPACE OAR INSTILLATION;  Surgeon: Steven Purpura, MD;  Location: [facility];  Service: Urology;  Laterality: N/A;   TEE WITHOUT CARDIOVERSION N/A 03/08/2023   Procedure: TRANSESOPHAGEAL ECHOCARDIOGRAM;  Surgeon: Alleen Borne, MD;  Location: [name];  Service: Open Heart Surgery;  Laterality: N/A;    Medication: Current Facility-Administered Medications  Medication Dose Route Frequency Provider Last Rate Last Admin   0.45 % sodium chloride infusion   Intravenous Continuous PRN Lovett Sox, MD 20 mL/hr at 03/10/23 2100 Infusion Verify at 03/10/23 2100   0.9 %  sodium chloride infusion (Manually program via Guardrails IV Fluids)   Intravenous Once Clegg, Amy D, NP       0.9 %  sodium chloride infusion  250 mL Intravenous Continuous Lovett Sox, MD       acetaminophen (TYLENOL) tablet 1,000 mg  1,000 mg Oral Q6H Lovett Sox, MD   1,000 mg at 03/13/23 1610   Or   acetaminophen (TYLENOL) 160 MG/5ML solution 1,000 mg  1,000 mg Per Tube Q6H Lovett Sox, MD   1,000 mg at 03/09/23 0509   amiodarone (NEXTERONE PREMIX) 360-4.14 MG/200ML-% (1.8 mg/mL) IV infusion  60 mg/hr Intravenous Continuous Clegg, Amy D, NP 33.3 mL/hr at 03/13/23 0938 60 mg/hr at 03/13/23 9604   amiodarone (NEXTERONE) 1.8 mg/mL load via infusion 150 mg  150 mg  Intravenous Once Clegg, Amy D, NP   150 mg at 03/13/23 5409   aspirin EC tablet 81 mg  81 mg Oral Daily Alleen Borne, MD   81 mg at 03/12/23 1009   Or   aspirin chewable tablet 81 mg  81 mg Per Tube Daily Alleen Borne, MD       Or   aspirin suppository 150 mg  150 mg Rectal Daily Alleen Borne, MD       atorvastatin (LIPITOR) tablet 80 mg  80 mg Oral Daily Lovett Sox, MD   80 mg at 03/12/23 1009   bisacodyl (DULCOLAX) EC tablet 10 mg  10 mg Oral Daily Lovett Sox, MD   10 mg at 03/12/23 1010   Or   bisacodyl (DULCOLAX) suppository 10 mg  10 mg Rectal Daily Lovett Sox, MD       Chlorhexidine Gluconate Cloth 2 % PADS 6 each  6 each Topical Daily [name], [name], DO   6 each at 03/12/23 1012   dextrose 50 % solution 0-50 mL  0-50 mL Intravenous PRN Lovett Sox, MD       docusate sodium (  COLACE) capsule 200 mg  200 mg Oral Daily Lovett Sox, MD   200 mg at 03/12/23 1009   feeding supplement (ENSURE ENLIVE / ENSURE PLUS) liquid 237 mL  237 mL Oral TID BM Lovett Sox, MD   237 mL at 03/12/23 1020   hydrALAZINE (APRESOLINE) tablet 25 mg  25 mg Oral Q8H Clegg, Amy D, NP       insulin aspart (novoLOG) injection 0-24 Units  0-24 Units Subcutaneous TID WC Alleen Borne, MD   2 Units at 03/13/23 0830   isosorbide mononitrate (IMDUR) 24 hr tablet 30 mg  30 mg Oral Daily Clegg, Amy D, NP   30 mg at 03/12/23 1010   lactated ringers infusion   Intravenous Continuous Lovett Sox, MD       lactated ringers infusion   Intravenous Continuous Lovett Sox, MD   Stopped at 03/10/23 0729   metoCLOPramide (REGLAN) injection 10 mg  10 mg Intravenous Q6H Lovett Sox, MD   10 mg at 03/13/23 7829   morphine (PF) 2 MG/ML injection 1-4 mg  1-4 mg Intravenous Q1H PRN Lovett Sox, MD   2 mg at 03/11/23 1046   multivitamin with minerals tablet 1 tablet  1 tablet Oral Daily Lovett Sox, MD   1 tablet at 03/12/23 1009   ondansetron (ZOFRAN) injection 4 mg  4 mg Intravenous  Q6H PRN Lovett Sox, MD       Oral care mouth rinse  15 mL Mouth Rinse PRN [name], [name], DO       oxyCODONE (Oxy IR/ROXICODONE) immediate release tablet 5-10 mg  5-10 mg Oral Q3H PRN Lovett Sox, MD   5 mg at 03/11/23 0258   pantoprazole (PROTONIX) EC tablet 40 mg  40 mg Oral Daily Lovett Sox, MD   40 mg at 03/12/23 1010   polyethylene glycol (MIRALAX / GLYCOLAX) packet 17 g  17 g Oral Daily Laurey Morale, MD   17 g at 03/12/23 1012   potassium chloride SA (KLOR-CON M) CR tablet 40 mEq  40 mEq Oral Q4H Laurey Morale, MD   40 mEq at 03/13/23 0617   sodium chloride flush (NS) 0.9 % injection 10-40 mL  10-40 mL Intracatheter Q12H Alleen Borne, MD   10 mL at 03/12/23 2151   sodium chloride flush (NS) 0.9 % injection 10-40 mL  10-40 mL Intracatheter PRN Alleen Borne, MD       sodium chloride flush (NS) 0.9 % injection 3 mL  3 mL Intravenous Q12H Lovett Sox, MD   3 mL at 03/12/23 2151   sodium chloride flush (NS) 0.9 % injection 3 mL  3 mL Intravenous PRN Lovett Sox, MD       tamsulosin (FLOMAX) capsule 0.4 mg  0.4 mg Oral QPC supper Lovett Sox, MD   0.4 mg at 03/12/23 1708   torsemide (DEMADEX) tablet 20 mg  20 mg Oral Daily Clegg, Amy D, NP       traMADol (ULTRAM) tablet 50-100 mg  50-100 mg Oral Q4H PRN Lovett Sox, MD   50 mg at 03/11/23 0035   warfarin (COUMADIN) tablet 2.5 mg  2.5 mg Oral ONCE-1600 Laurey Morale, MD       Warfarin - Physician Dosing Inpatient   Does not apply q1600 Steven Nettles, DO   Given at 03/11/23 1610    Allergies: No Known Allergies  Social History: Social History   Tobacco Use   Smoking status: Former    Current packs/day: 0.00  Types: Cigarettes, Cigars    Quit date: 2012    Years since quitting: 12.7   Smokeless tobacco: Never  Vaping Use   Vaping status: Never Used  Substance Use Topics   Alcohol use: Yes    Alcohol/week: 2.0 - 3.0 standard drinks of alcohol    Types: 2 - 3 drink(s) per week    Drug use: No    Family History History reviewed. No pertinent family history.  Review of Systems  Genitourinary:  Positive for hematuria.     Objective   Vital signs in last 24 hours: BP (!) 115/92 (BP Location: Left Arm)   Pulse 81   Temp 98.1 F (36.7 C) (Oral)   Resp 19   Ht 5\' 9"  (1.753 m)   Wt 69.6 kg   SpO2 100%   BMI 22.66 kg/m   Physical Exam General: NAD, A&O, resting, appropriate HEENT: [place]/AT Pulmonary: Normal work of breathing Cardiovascular: , LVAD, no cyanosis Abdomen: Soft, NTTP, nondistended GU: Pure wick in place draining clear medium-light red urine.  Most Recent Labs: Lab Results  Component Value Date   WBC 11.5 (H) 03/13/2023   HGB 7.0 (L) 03/13/2023   HCT 21.1 (L) 03/13/2023   PLT 152 03/13/2023    Lab Results  Component Value Date   NA 130 (L) 03/13/2023   K 3.2 (L) 03/13/2023   CL 90 (L) 03/13/2023   CO2 27 03/13/2023   BUN 41 (H) 03/13/2023   CREATININE 1.76 (H) 03/13/2023   CALCIUM 8.3 (L) 03/13/2023   MG 2.0 03/13/2023   PHOS 4.0 03/13/2023    Lab Results  Component Value Date   INR 1.6 (H) 03/13/2023   APTT 37 (H) 03/08/2023     Urine Culture: @[id number] (laburin,org,[id number],[id number])@   IMAGING: DG CHEST PORT 1 VIEW  Result Date: 03/11/2023 CLINICAL DATA:  Evaluate PICC line placement. EXAM: PORTABLE CHEST 1 VIEW COMPARISON:  03/10/2023 FINDINGS: Interval placement of right arm PICC line with tip at the superior cavoatrial junction. Right IJ Cordis with Swan-Ganz catheter. Tip of the catheter is in the right lower lobar pulmonary artery. Left chest wall ICD noted with leads in the right atrial appendage and right ventricle. LVAD device identified. Stable cardiomediastinal contours. Previous median sternotomy. Interval removal of the right chest tube. No pneumothorax visualized. Small right pleural effusion with veil like opacification overlying the right mid and right lower lung. Mild interstitial prominence is stable.  IMPRESSION: 1. Interval placement of right arm PICC line with tip at the superior cavoatrial junction. 2. Interval removal of right chest tube. No pneumothorax visualized. 3. Small right pleural effusion with veil like opacification overlying the right mid and right lower lung. Electronically Signed   By: Signa Kell M.D.   On: 03/11/2023 12:47    ------  Elmon Kirschner, NP Pager: [phone]   Please contact the urology consult pager with any further questions/concerns.

## 2023-03-13 NOTE — Progress Notes (Signed)
Nutrition Follow-up  DOCUMENTATION CODES:   Not applicable  INTERVENTION:   Liberalize diet to Regular with no salt packets until pt appetite and po intake improved  Ensure Enlive po TID, each supplement provides 350 kcal and 20 grams of protein.   NUTRITION DIAGNOSIS:   Increased nutrient needs related to chronic illness as evidenced by estimated needs.  Being addressed via supplements  GOAL:   Patient will meet greater than or equal to 90% of their needs  Progressing  MONITOR:   PO intake, Supplement acceptance, Weight trends, Skin, I & O's  REASON FOR ASSESSMENT:   Consult LVAD Eval  ASSESSMENT:   67 y.o.male with PMHx including ischemic cardiomyopathy, systolic heart failure, LV thrombus, hx of ventricular tachycardia s/p ICD, HTN, CKD 3, HLD who presents with acute onset of dyspnea and nausea.  9/12 HM3 LVAD Implant, Intra-op TEE LVEF 15%, RV normal 9/13 Extubated  Recorded po intake of 25% of Breakfast and Lunch today. Pt reports poor appetite post surgery. Pt reports he is drinking some Ensure  Current wt 69.6 kg; lowest wt pre-op 66.9 kg.  Labs: sodium 130 (L), potassium 3.2 (L), Creatinine 1.76, BUN 41, phosphorus 4.0 (wdl), magnesium 2.0 (wdl), CBGs 96-189 Meds: dulcolax, ss novolog, MVI with minerals, miralax, torsemide   Diet Order:  HEART HEALTHY/CARB MODIFIED   EDUCATION NEEDS:   Education needs have been addressed  Skin:  Skin Assessment: Skin Integrity Issues: Skin Integrity Issues:: Incisions Incisions: driveline (new LVAD)  Last BM:  9/16  Height:   Ht Readings from Last 1 Encounters:  02/26/23 5\' 9"  (1.753 m)    Weight:   Wt Readings from Last 1 Encounters:  03/13/23 69.6 kg    Ideal Body Weight:     BMI:  Body mass index is 22.66 kg/m.  Estimated Nutritional Needs:   Kcal:  2000-2200 kcals  Protein:  95-110 g  Fluid:  1.8 L  Romelle Starcher MS, RDN, LDN, CNSC Registered Dietitian 3 Clinical Nutrition RD Pager  and On-Call Pager Number Located in [place]

## 2023-03-13 NOTE — Progress Notes (Addendum)
Patient ID: Steven [name], male   DOB: 07-May-1956, 67 y.o.   MRN: [id number]   Advanced Heart Failure VAD Team Note  PCP-Cardiologist: Dr. Gala Romney   Subjective:    9/12: HM3 LVAD Implant. Intra-op TEE LVEF 15%, RV normal  9/13: Extubated.  Driveline pulled back, 1 inch of Velour exposed.  9/14: Went into atrial fibrillation with RVR,  IV amiodarone started.  9/15: Hematuria.  9/16: SR, amio cut back to 30 mg per hour  Remains on amio drip 60 mg per hour.    Hematuria x 72 hours. Hgb trending down from 8.5-->7.  INR 1.6    Tbili 5.9 => 3.8=>2.3   LDH 310>274   Ongoing chest soreness. Denies SOB.   LVAD INTERROGATION:  HeartMate III LVAD:   Flow 3.7 liters/min, speed 5400, power 4, PI 6  No PI events.   Objective:    Vital Signs:   Temp:  [97.7 F (36.5 C)-98.1 F (36.7 C)] 98.1 F (36.7 C) (09/17 0300) Pulse Rate:  [74-90] 81 (09/17 0600) Resp:  [14-29] 19 (09/17 0700) BP: (90-126)/(67-92) 115/92 (09/17 0400) SpO2:  [89 %-100 %] 100 % (09/17 0600) Arterial Line BP: (89-143)/(67-92) 126/79 (09/16 1700) Weight:  [69.6 kg] 69.6 kg (09/17 0500) Last BM Date : 03/12/23 Mean arterial Pressure  80-100s  Intake/Output:   Intake/Output Summary (Last 24 hours) at 03/13/2023 0841 Last data filed at 03/13/2023 0500 Gross per 24 hour  Intake 374.27 ml  Output 1875 ml  Net -1500.73 ml    CVP 7-8  Physical Exam     Physical Exam: GENERAL: No acute distress. Sitting in the chair.  HEENT: normal  NECK: Supple, JVP 7-8  .  2+ bilaterally, no bruits.  No lymphadenopathy or thyromegaly appreciated.   CARDIAC:  Mechanical heart sounds with LVAD hum present. Sternal incision.  LUNGS:  Clear to auscultation bilaterally.  ABDOMEN:  Soft, round, nontender, positive bowel sounds x4.     LVAD exit site: Dressing dry and intact.  No erythema or drainage.  Stabilization device present and accurately applied.  Driveline dressing is being changed daily per sterile technique. EXTREMITIES:   Warm and dry, no cyanosis, clubbing, rash or edema . RUE PICC  NEUROLOGIC:  Alert and oriented x 3.    No aphasia.  No dysarthria.  Affect pleasant.      Telemetry   A fib 100-120s   EKG    No new EKG to review   Labs   Basic Metabolic Panel: Recent Labs  Lab 03/09/23 0429 03/09/23 1036 03/10/23 0451 03/10/23 0459 03/10/23 1900 03/10/23 1910 03/11/23 0304 03/11/23 1430 03/12/23 0330 03/12/23 0627 03/13/23 0433  NA 138   < > 133*   < > 131*   < > 131* 132* 131* 133* 130*  K 4.8   < > 4.4   < > 3.7   < > 4.0 4.0 3.9 3.8 3.2*  CL 103   < > 102  --  99  --  100 96* 95*  --  90*  CO2 22   < > 21*  --  19*  --  21* 22 23  --  27  GLUCOSE 92   < > 152*  --  169*  --  179* 162* 130*  --  225*  BUN 20   < > 21  --  24*  --  26* 29* 34*  --  41*  CREATININE 1.28*   < > 1.50*  --  1.60*  --  1.58* 1.68* 1.72*  --  1.76*  CALCIUM 8.4*   < > 8.5*  --  8.6*  --  8.5* 8.7* 8.7*  --  8.3*  MG 2.4   < > 2.5*  --  2.4  --  2.2  --  2.1  --  2.0  PHOS 4.6  --  3.4  --   --   --  2.2*  --  3.5  --  4.0   < > = values in this interval not displayed.    Liver Function Tests: Recent Labs  Lab 03/09/23 0429 03/10/23 0451 03/11/23 0304 03/12/23 0330 03/13/23 0433  AST 54* 76* 66* 53* 51*  ALT 18 21 21 20 25   ALKPHOS 37* 45 53 69 61  BILITOT 4.2* 5.9* 3.8* 2.3* 1.8*  PROT 5.5* 6.2* 6.3* 6.4* 5.7*  ALBUMIN 3.3* 3.5 3.2* 3.0* 2.6*   No results for input(s): "LIPASE", "AMYLASE" in the last 168 hours. No results for input(s): "AMMONIA" in the last 168 hours.  CBC: Recent Labs  Lab 03/09/23 0429 03/09/23 1036 03/10/23 0451 03/10/23 0459 03/11/23 0304 03/11/23 1751 03/12/23 0330 03/12/23 0627 03/13/23 0433  WBC 12.1*   < > 17.4*  --  18.5* 16.9* 15.4*  --  11.5*  NEUTROABS 9.1*  --  15.0*  --  16.2*  --  12.7*  --  9.2*  HGB 9.4*   < > 8.5*   < > 8.5* 8.3* 8.5* 8.5* 7.0*  HCT 27.3*   < > 25.6*   < > 25.0* 25.1* 24.1* 25.0* 21.1*  MCV 91.9   < > 91.4  --  90.6 95.1 90.9  --   90.9  PLT 106*   < > 121*  --  125* 136* 151  --  152   < > = values in this interval not displayed.    INR: Recent Labs  Lab 03/09/23 0429 03/10/23 0451 03/11/23 0304 03/12/23 0330 03/13/23 0433  INR 1.6* 2.1* 1.9* 1.5* 1.6*    Other results: EKG:    Imaging   DG CHEST PORT 1 VIEW  Result Date: 03/11/2023 CLINICAL DATA:  Evaluate PICC line placement. EXAM: PORTABLE CHEST 1 VIEW COMPARISON:  03/10/2023 FINDINGS: Interval placement of right arm PICC line with tip at the superior cavoatrial junction. Right IJ Cordis with Swan-Ganz catheter. Tip of the catheter is in the right lower lobar pulmonary artery. Left chest wall ICD noted with leads in the right atrial appendage and right ventricle. LVAD device identified. Stable cardiomediastinal contours. Previous median sternotomy. Interval removal of the right chest tube. No pneumothorax visualized. Small right pleural effusion with veil like opacification overlying the right mid and right lower lung. Mild interstitial prominence is stable. IMPRESSION: 1. Interval placement of right arm PICC line with tip at the superior cavoatrial junction. 2. Interval removal of right chest tube. No pneumothorax visualized. 3. Small right pleural effusion with veil like opacification overlying the right mid and right lower lung. Electronically Signed   By: Signa Kell M.D.   On: 03/11/2023 12:47   [place] EKG SITE RITE  Result Date: 03/11/2023 If Site Rite image not attached, placement could not be confirmed due to current cardiac rhythm.    Medications:     Scheduled Medications:  acetaminophen  1,000 mg Oral Q6H   Or   acetaminophen (TYLENOL) oral liquid 160 mg/5 mL  1,000 mg Per Tube Q6H   aspirin EC  81 mg Oral Daily   Or   aspirin  81 mg Per Tube Daily   Or   aspirin  150 mg Rectal Daily   atorvastatin  80 mg Oral Daily   bisacodyl  10 mg Oral Daily   Or   bisacodyl  10 mg Rectal Daily   Chlorhexidine Gluconate Cloth  6 each Topical  Daily   docusate sodium  200 mg Oral Daily   feeding supplement  237 mL Oral TID BM   hydrALAZINE  12.5 mg Oral Q8H   insulin aspart  0-24 Units Subcutaneous TID WC   isosorbide mononitrate  30 mg Oral Daily   metoCLOPramide (REGLAN) injection  10 mg Intravenous Q6H   multivitamin with minerals  1 tablet Oral Daily   pantoprazole  40 mg Oral Daily   polyethylene glycol  17 g Oral Daily   potassium chloride  40 mEq Oral Q4H   sodium chloride flush  10-40 mL Intracatheter Q12H   sodium chloride flush  3 mL Intravenous Q12H   tamsulosin  0.4 mg Oral QPC supper   warfarin  2.5 mg Oral ONCE-1600   Warfarin - Physician Dosing Inpatient   Does not apply q1600    Infusions:  sodium chloride 20 mL/hr at 03/10/23 2100   sodium chloride     amiodarone 30 mg/hr (03/13/23 0500)   lactated ringers     lactated ringers Stopped (03/10/23 0729)    PRN Medications: sodium chloride, dextrose, morphine injection, ondansetron (ZOFRAN) IV, mouth rinse, oxyCODONE, sodium chloride flush, sodium chloride flush, traMADol   Patient Profile   Steven [name] is a 67 y.o. 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    Admitted with acute on chronic systolic and diastolic CHF with low-output. Advanced Heart Failure consulted and workup started for advanced therapies. S/p HM3 LVAD 9/12.   Assessment/Plan:    1. Acute on chronic combined systolic and diastolic heart failure>>Low Output S/p HM3 LVAD on 9/12.  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 NYHA IV on admission.  Echo 02/27/23: EF <20%, LV with GHK, RV mildly reduced, GIIDD, LA mod dilated, mild MR.  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 GDMT limited by renal function and hypotension.  S/p HM3 LVAD implant 9/12.  Has calcified pericardium from prior  pericarditis. Intra-op TEE LVEF 15%, RV normal.  CO-OX 62%. Stable off milrinone.   - CVP 7-8. Start torsemide 20 mg daily.   - Increase hydralazine to 25 mg three times a day and imdur 30  - LDH stable.  - INR 1.6 . Pharmacy to start dosing coumadin.  - ASA decreased to 81 with hematuria 2. 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  - on statin  - on ASA  3. AKI on CKD stage 3: Creatinine  1.58-->1.7   4. Mural thrombus: Warfarin.  5. H/o VT s/p Medtronic ICD: likely scar mediated - Continue amiodarone  6. Anemia: Post-op. Hgb 8.5>7  today.  - Transfuse 1 unit blood. .  7. Atrial fibrillation:  Back in A fib. Increase amio to 60 mg per hour and give 150 mg bolus.   - On coumadin . INR 1.6  8. Hematuria:  -Ongoing hematuria.  INR 1.6  - Consult Urology.    I reviewed the LVAD parameters from today, and compared the results to the  patient's prior recorded data.  No programming changes were made.  The LVAD is functioning within specified parameters.  The patient performs LVAD self-test daily.  LVAD interrogation was negative for any significant power changes, alarms or PI events/speed drops.  LVAD equipment check completed and is in good working order.  Back-up equipment present.   LVAD education done on emergency procedures and precautions and reviewed exit site care.  Length of Stay: 15  Tonye Becket, NP 03/13/2023, 8:41 AM  VAD Team --- VAD ISSUES ONLY--- Pager [phone] (7am - 7am)  Advanced Heart Failure Team  Pager [phone] (M-F; 7a - 5p)  Please contact [facility] for night-coverage after hours (5p -7a ) and weekends on [email or web address]  Patient seen with NP, agree with the above note.   Stable today, co-ox 62% off milrinone. Creatinine stable 1.76. MAP 90s.  Hgb lower at 7 with ongoing hematuria.  Foley is now out.  He is back in atrial fibrillation rate 100s.   Has been up to chair today.   General: Well appearing this am. NAD.  HEENT: Normal. Neck: Supple,  JVP 7-8 cm. Carotids OK.  Cardiac:  Mechanical heart sounds with LVAD hum present.  Lungs:  CTAB, normal effort.  Abdomen:  NT, ND, no HSM. No bruits or masses. +BS  LVAD exit site: Well-healed and incorporated. Dressing dry and intact. No erythema or drainage. Stabilization device present and accurately applied. Driveline dressing changed daily per sterile technique. Extremities:  Warm and dry. No cyanosis, clubbing, rash, or edema.  Neuro:  Alert & oriented x 3. Cranial nerves grossly intact. Moves all 4 extremities w/o difficulty. Affect pleasant   GU: Hematuria  Creatinine stable 1.75 with co-ox 62% and CVP 7.  Continue torsemide at 20 mg daily, will remain off milrinone. Replace K.   MAP elevated, increase hydralazine as above. Continue Imdur.   Ramp echo was done this morning, speed increased 5400 => 5500.  Echo showed mild RV dilation/mild RV dysfunction, septum was at midline at 5500 rpm, the aortic valve did not open.  We did not increase above 5500 rpm.   Increase amiodarone back to 60 mg/hr for now, back in atrial fibrillation.  Continue ASA 81 (decreased with hematuria) and warfarin, INR 1.6 today .  Urology has seen, manage conservatively for now, hematuria likely due to foley trauma with enlarged prostate.  ASA decreased to 81 and will not use heparin for now.  Must continue warfarin.  Hgb 7 today, will give 1 unit PRBCs.   CRITICAL CARE Performed by: Marca Ancona  Total critical care time: 40 minutes  Critical care time was exclusive of separately billable procedures and treating other patients.  Critical care was necessary to treat or prevent imminent or life-threatening deterioration.  Critical care was time spent personally by me on the following activities: development of treatment plan with patient and/or surrogate as well as nursing, discussions with consultants, evaluation of patient's response to treatment, examination of patient, obtaining history from patient  or surrogate, ordering and performing treatments and interventions, ordering and review of laboratory studies, ordering and review of radiographic studies, pulse oximetry and re-evaluation of patient's condition.  Marca Ancona 03/13/2023 9:40 AM

## 2023-03-13 NOTE — Progress Notes (Signed)
LVAD Coordinator Rounding Note:  Admitted 02/26/23 due to CHF.   HM3 LVAD implanted on 03/08/23 by Dr Laneta Simmers under DT criteria.  9/12: HM3 LVAD Implant. Intra-op TEE LVEF 15%, RV normal  9/13: Extubated.  Driveline pulled back, 1 inch of Velour exposed.  9/14: Went into atrial fibrillation with RVR,  IV amiodarone started.  9/15: Hematuria.   Pt continues with gross hematuria. Hgb down to 7 this morning. Provider team will consult urology. Pt has seen Dr [name] at [facility] for his history of prostate cancer. He was last seen in April.  Vital signs: Temp: 98.1 HR: 81 Doppler Pressure:80 Auto BP: 115/92 (99) O2 Sat: 100% on 2L/[place] Wt:155.6>169.5>162>153.4lbs     LVAD interrogation reveals:   Speed: 5400 Flow: 4.7 Power:  3.9 PI: 2.9 Alarms: none  Events:  none  Hematocrit: 21 Fixed speed: 5400 Low speed limit: 5100   Drive Line: CDI. Drive line anchor secure x 2. Continue to every other day dressing changes. Next dressing change due 03/14/23 by nurse champion or VAD coordinator.     Labs:  LDH trend: 234>310>274  INR trend: 1.6>1.5>1.6  Anticoagulation Plan: -INR Goal: 2-2.5 -ASA Dose: 325 mg until INR therapeutic  Blood Products:  -Intra op: 03/08/23 1 PRBC 2 FFP 1 PLT 1 Cryo 450 cell saver  Post op: 03/08/23 1 PRBC 2 FFP 1 PLT 1 Cryo  03/09/23: 1 PRBC  Device: -Medtronic -Therapies: OFF  Arrythmias:   Respiratory: on 4L/[place]  Infection:   Renal:  -CRT: 1.28>1.72>1.76  Gtts: Milrinone 0.125 mcg/kg/min-off Amiodarone 30 mg/hr  Patient Education: Pt was educated on use of mask and cap for sterile dressing changes on 9/16. Today pt was educated on batteries, battery clips and switching from wall to batteries.  Adverse Events on VAD: -  Plan/Recommendations:  1. Please page VAD coordinator with any equipment issues or driveline problems. 2. Continue every other day dressing changes using silver strip by VAD coordinator or nurse champion.  Next dressing due 03/14/23.   Carlton Adam RN, BSN VAD Coordinator 24/7 Pager [phone]

## 2023-03-14 ENCOUNTER — Ambulatory Visit (HOSPITAL_COMMUNITY): Payer: 59

## 2023-03-14 DIAGNOSIS — I5023 Acute on chronic systolic (congestive) heart failure: Secondary | ICD-10-CM | POA: Diagnosis not present

## 2023-03-14 LAB — GLUCOSE, CAPILLARY
Glucose-Capillary: 128 mg/dL — ABNORMAL HIGH (ref 70–99)
Glucose-Capillary: 128 mg/dL — ABNORMAL HIGH (ref 70–99)
Glucose-Capillary: 118 mg/dL — ABNORMAL HIGH (ref 70–99)
Glucose-Capillary: 167 mg/dL — ABNORMAL HIGH (ref 70–99)

## 2023-03-14 LAB — BPAM RBC
Blood Product Expiration Date: 202410092359
ISSUE DATE / TIME: 202409171237
Unit Type and Rh: 6200

## 2023-03-14 LAB — TYPE AND SCREEN
ABO/RH(D): AB POS
Antibody Screen: NEGATIVE
Unit division: 0

## 2023-03-14 MED ORDER — WARFARIN SODIUM 4 MG PO TABS
4.0000 mg | ORAL_TABLET | Freq: Once | ORAL | Status: AC
  Administered 2023-03-14: 4 mg via ORAL
  Filled 2023-03-14: qty 1

## 2023-03-14 MED ORDER — ISOSORB DINITRATE-HYDRALAZINE 20-37.5 MG PO TABS
1.0000 | ORAL_TABLET | Freq: Three times a day (TID) | ORAL | Status: DC
  Administered 2023-03-14 – 2023-03-16 (×7): 1 via ORAL
  Filled 2023-03-14 (×10): qty 1

## 2023-03-14 MED ORDER — WARFARIN - PHARMACIST DOSING INPATIENT: Freq: Every day | Status: DC

## 2023-03-14 NOTE — Progress Notes (Signed)
Outpatient Heart Failure CSW met with pt to help address current concerns with paying for his electric and water bills.  CSW assisted pt in calling [employer] [phone]) and confirmed that he has past due bill that was due on 9/16 for $36.24.  There is no risk that electricity will be turned off at this time- confirmed that if patient is able to pay at the beginning of the month he would still be in good standing.  CSW assisted in calling [place] [facility] [place] [phone]) who rpeorts pt owes $49.50 which is due 9/19.  They also confirm if pt pays at the beginning of the month there is no risk of turn off.  Pt will have [employer] reloaded on 10/1 and will plan to pay bills then.  Will continue to follow during inpatient stay and assist as needed  Burna Sis, LCSW Clinical Social Worker [facility] Failure [facility] Desk#: [phone] Cell#: [phone]

## 2023-03-14 NOTE — Progress Notes (Addendum)
Patient ID: Steven [name], male   DOB: [birth year], 67 y.o.   MRN: [id number]   Advanced Heart Failure VAD Team Note  PCP-Cardiologist: Dr. Gala Romney   Subjective:    9/12: HM3 LVAD Implant. Intra-op TEE LVEF 15%, RV normal  9/13: Extubated.  Driveline pulled back, 1 inch of Velour exposed.  9/14: Went into atrial fibrillation with RVR,  IV amiodarone started.  9/15: Hematuria.  9/16: SR, amio cut back to 30 mg per hour 9/17: Urology consulted for hematuria. Proscar added. Given 1 unit of blood. Ramp Echo speed increased to 5500.   Remains on amio drip 60 mg per hour.    Hgb 7>9 . Hematuria seems to be clearing.   Tbili 5.9 => 3.8=>2.3 =>1.98   LDH 310>274 >302   Walked around the unit. Asking when he can go back to work. Appetite improving. Eating most of his meals.    LVAD INTERROGATION:  HeartMate III LVAD:   Flow 4.3  liters/min, speed 5500, power 4, PI 3.4     Objective:    Vital Signs:   Temp:  [97.4 F (36.3 C)-98.6 F (37 C)] 97.8 F (36.6 C) (09/18 0800) Pulse Rate:  [75-151] 80 (09/18 0800) Resp:  [14-29] 24 (09/18 0800) BP: (82-125)/(65-107) 109/94 (09/18 0800) SpO2:  [91 %-100 %] 98 % (09/18 0800) Weight:  [71.8 kg] 71.8 kg (09/18 0300) Last BM Date : 03/14/23 Mean arterial Pressure  100s   Intake/Output:   Intake/Output Summary (Last 24 hours) at 03/14/2023 0851 Last data filed at 03/14/2023 0800 Gross per 24 hour  Intake 2179.15 ml  Output 2500 ml  Net -320.85 ml    CVP 6 Physical Exam   Physical Exam: GENERAL: No acute distress. HEENT: normal  NECK: Supple, JVP flat .  2+ bilaterally, no bruits.  No lymphadenopathy or thyromegaly appreciated.   CARDIAC:  Mechanical heart sounds with LVAD hum present. Sternal incision approximated.  LUNGS:  Clear to auscultation bilaterally.  ABDOMEN:  Soft, round, nontender, positive bowel sounds x4.     LVAD exit site:  Dressing dry and intact.  No erythema or drainage.  Stabilization device present and  accurately applied.  Driveline dressing is being changed daily per sterile technique. EXTREMITIES:  Warm and dry, no cyanosis, clubbing, rash or edema  NEUROLOGIC:  Alert and oriented x 3.    No aphasia.  No dysarthria.  Affect pleasant.     Telemetry  Intermittently pacing.   EKG    No new EKG to review   Labs   Basic Metabolic Panel: Recent Labs  Lab 03/10/23 0451 03/10/23 0459 03/10/23 1900 03/10/23 1910 03/11/23 0304 03/11/23 0525 03/11/23 1430 03/12/23 0330 03/12/23 0627 03/13/23 0433 03/13/23 1731 03/14/23 0521  NA 133*   < > 131*   < > 131*   < > 132* 131* 133* 130* 131* 136  K 4.4   < > 3.7   < > 4.0   < > 4.0 3.9 3.8 3.2* 3.7 3.9  CL 102  --  99  --  100  --  96* 95*  --  90* 93* 96*  CO2 21*  --  19*  --  21*  --  22 23  --  27 24 25   GLUCOSE 152*  --  169*  --  179*  --  162* 130*  --  225* 229* 128*  BUN 21  --  24*  --  26*  --  29* 34*  --  41* 43*  38*  CREATININE 1.50*  --  1.60*  --  1.58*  --  1.68* 1.72*  --  1.76* 1.74* 1.63*  CALCIUM 8.5*  --  8.6*  --  8.5*  --  8.7* 8.7*  --  8.3* 7.9* 8.8*  MG 2.5*  --  2.4  --  2.2  --   --  2.1  --  2.0  --  2.2  PHOS 3.4  --   --   --  2.2*  --   --  3.5  --  4.0  --  3.1   < > = values in this interval not displayed.    Liver Function Tests: Recent Labs  Lab 03/10/23 0451 03/11/23 0304 03/12/23 0330 03/13/23 0433 03/14/23 0521  AST 76* 66* 53* 51* 46*  ALT 21 21 20 25 29   ALKPHOS 45 53 69 61 68  BILITOT 5.9* 3.8* 2.3* 1.8* 1.9*  PROT 6.2* 6.3* 6.4* 5.7* 6.5  ALBUMIN 3.5 3.2* 3.0* 2.6* 2.8*   No results for input(s): "LIPASE", "AMYLASE" in the last 168 hours. No results for input(s): "AMMONIA" in the last 168 hours.  CBC: Recent Labs  Lab 03/10/23 0451 03/10/23 0459 03/11/23 0304 03/11/23 0525 03/11/23 1751 03/12/23 0330 03/12/23 0627 03/13/23 0433 03/13/23 1731 03/14/23 0521  WBC 17.4*  --  18.5*  --  16.9* 15.4*  --  11.5*  --  12.8*  NEUTROABS 15.0*  --  16.2*  --   --  12.7*  --   9.2*  --  9.6*  HGB 8.5*   < > 8.5*   < > 8.3* 8.5* 8.5* 7.0* 8.0* 9.0*  HCT 25.6*   < > 25.0*   < > 25.1* 24.1* 25.0* 21.1* 24.2* 26.2*  MCV 91.4  --  90.6  --  95.1 90.9  --  90.9  --  91.3  PLT 121*  --  125*  --  136* 151  --  152  --  195   < > = values in this interval not displayed.    INR: Recent Labs  Lab 03/10/23 0451 03/11/23 0304 03/12/23 0330 03/13/23 0433 03/14/23 0521  INR 2.1* 1.9* 1.5* 1.6* 1.5*    Other results: EKG:    Imaging   ECHOCARDIOGRAM LIMITED  Result Date: 03/13/2023    ECHOCARDIOGRAM LIMITED REPORT   Patient Name:   Steven [name] [facility] Date of Exam: 03/13/2023 Medical Rec #:  [id number]    Height:       69.0 in Accession #:    [id number]   Weight:       153.4 lb Date of Birth:  Aug 06, 1955    BSA:          1.846 m Patient Age:    67 years     BP:           0/0 mmHg Patient Gender: M            HR:           102 bpm. Exam Location:  Inpatient Procedure: Limited Echo, Cardiac Doppler and Color Doppler Indications:    RAMP LVAD Evaluation  History:        Patient has prior history of Echocardiogram examinations, most                 recent 03/08/2023. CHF, CAD, Defibrillator; Risk                 Factors:Hypertension, Dyslipidemia and ETOH. HMIII LVAD  Implanted 03/08/23.  Sonographer:    Irving Burton Senior RDCS Referring Phys: [phone] [facility]  Sonographer Comments: Ramp echo with Dr Shirlee Latch at bedside IMPRESSIONS  1. LVAD inflow cannula noted at apex. Left ventricular ejection fraction, by estimation, is <20%. The left ventricle has severely decreased function. The left ventricle demonstrates global hypokinesis.  2. Right ventricular systolic function is moderately reduced. The right ventricular size is mildly enlarged.  3. Right atrial size was mildly dilated.  4. The tricuspid valve is abnormal. Tricuspid valve regurgitation is moderate.  5. The aortic valve does not open. The aortic valve is tricuspid. No aortic insufficiency.  6. Ramp echo was done. Speed  initially 5400, increased to 5500. Interventricular septum mid-line at 5500 with no aortic valve opening. Speed left at 5500 FINDINGS  Left Ventricle: LVAD inflow cannula noted at apex. Left ventricular ejection fraction, by estimation, is <20%. The left ventricle has severely decreased function. The left ventricle demonstrates global hypokinesis. The left ventricular internal cavity size was normal in size. There is no left ventricular hypertrophy. Right Ventricle: The right ventricular size is mildly enlarged. Right ventricular systolic function is moderately reduced. Left Atrium: Left atrial size was normal in size. Right Atrium: Right atrial size was mildly dilated. Tricuspid Valve: The tricuspid valve is abnormal. Tricuspid valve regurgitation is moderate. Aortic Valve: The aortic valve does not open. The aortic valve is tricuspid. Aortic valve regurgitation is not visualized. Pulmonic Valve: The pulmonic valve was normal in structure. Pulmonic valve regurgitation is trivial. Additional Comments: A device lead is visualized in the right ventricle. Spectral Doppler performed. Color Doppler performed.  Gerline Ratto [employer] Electronically signed by Wilfred Lacy Signature Date/Time: 03/13/2023/10:29:40 AM    Final      Medications:     Scheduled Medications:  aspirin EC  81 mg Oral Daily   Or   aspirin  81 mg Per Tube Daily   Or   aspirin  150 mg Rectal Daily   atorvastatin  80 mg Oral Daily   bisacodyl  10 mg Oral Daily   Or   bisacodyl  10 mg Rectal Daily   Chlorhexidine Gluconate Cloth  6 each Topical Daily   docusate sodium  200 mg Oral Daily   feeding supplement  237 mL Oral TID BM   finasteride  5 mg Oral Daily   hydrALAZINE  50 mg Oral Q8H   insulin aspart  0-24 Units Subcutaneous TID WC   isosorbide mononitrate  30 mg Oral Daily   multivitamin with minerals  1 tablet Oral Daily   pantoprazole  40 mg Oral Daily   polyethylene glycol  17 g Oral Daily   sodium chloride flush  10-40 mL  Intracatheter Q12H   sodium chloride flush  3 mL Intravenous Q12H   tamsulosin  0.4 mg Oral QPC supper   torsemide  20 mg Oral Daily   Warfarin - Physician Dosing Inpatient   Does not apply q1600    Infusions:  sodium chloride 20 mL/hr at 03/10/23 2100   sodium chloride     amiodarone 60 mg/hr (03/14/23 0800)   lactated ringers     lactated ringers Stopped (03/10/23 0729)    PRN Medications: sodium chloride, dextrose, morphine injection, ondansetron (ZOFRAN) IV, mouth rinse, oxyCODONE, sodium chloride flush, sodium chloride flush, traMADol   Patient Profile   Steven [name] is a 67 y.o. 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    Admitted with acute on chronic systolic and diastolic CHF with low-output. Advanced Heart Failure consulted and workup started for advanced therapies. S/p HM3 LVAD 9/12.   Assessment/Plan:    1. Acute on chronic combined systolic and diastolic heart failure>>Low Output S/p HM3 LVAD on 9/12.  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 NYHA IV on admission.  Echo 02/27/23: EF <20%, LV with GHK, RV mildly reduced, GIIDD, LA mod dilated, mild MR.  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 GDMT limited by renal function and hypotension.  S/P HM3 LVAD implant 9/12.  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 Intra-op TEE LVEF 15%, RV normal.  - Ramp ECHO 03/13/23. Speed increased to 5500. Pain controlled with tylenol.  -CO-OX stable off milrinone.  - CVP 6. Continue torsemide 20 mg daily.   - Stop hydralazine and imdur. Start BIDIL 1 tab three times a day.  - LDH stable.  - INR 1.5 . Pharmacy to start dosing coumadin.  - ASA decreased to 81 with hematuria 2. 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  - on statin  - on ASA  3. AKI on CKD stage  3: Creatinine baseline 1.6-1.8 Creatinine  1.6  , stable.  4. Mural thrombus: Warfarin.  5. H/o VT s/p Medtronic ICD: likely scar mediated - Continue amiodarone  6. Anemia: Post-op.  9/17 1UPRBCs.  Hgb 7>8>9.   - Hematuria getting a little better.  7. Atrial fibrillation:  -Continue amio drip.  - On coumadin . INR 1.5 8. Hematuria:  -Urology consulted. Started on proscar.  -Seems to be clearing.   Move to [facility].  Rehab Coordinator following for possible CIR admit.    I reviewed the LVAD parameters from today, and compared the results to the patient's prior recorded data.  No programming changes were made.  The LVAD is functioning within specified parameters.  The patient performs LVAD self-test daily.  LVAD interrogation was negative for any significant power changes, alarms or PI events/speed drops.  LVAD equipment check completed and is in good working order.  Back-up equipment present.   LVAD education done on emergency procedures and precautions and reviewed exit site care.  Length of Stay: 16  Tonye Becket, NP 03/14/2023, 8:51 AM  VAD Team --- VAD ISSUES ONLY--- Pager [phone] (7am - 7am)  Advanced Heart Failure Team  Pager [phone] (M-F; 7a - 5p)  Please contact [facility] for night-coverage after hours (5p -7a ) and weekends on [email or web address]  Patient seen with NP, agree with the above note.   Doing well this morning, has been walking in hall today.  CVP 6.  He is out of atrial fibrillation, is a-pacing.   General: Well appearing this am. NAD.  HEENT: Normal. Neck: Supple, JVP 7-8 cm. Carotids OK.  Cardiac:  Mechanical heart sounds with LVAD hum present.  Lungs:  CTAB, normal effort.  Abdomen:  NT, ND, no HSM. No bruits or masses. +BS  LVAD exit site: Well-healed and incorporated. Dressing dry and intact. No erythema or drainage. Stabilization device present and accurately applied. Driveline dressing changed daily per sterile technique. Extremities:  Warm and dry. No  cyanosis, clubbing, rash, or edema.  Neuro:  Alert & oriented x 3. Cranial nerves grossly intact. Moves all 4 extremities w/o difficulty. Affect pleasant    Continue to mobilize and encourage nutrition.  Continue po torsemide. MAP elevated at times, transition to Bidil 1 tab tid.  Creatinine stable at 1.63.   Still with hematuria but clearing, hgb 9 today.   He is out of atrial fibrillation today, a-pacing.  Can decrease amiodarone to 30 mg/hr.  Probably to po tomorrow if remains out of AF.   INR 1.5, adjusting warfarin.   Marca Ancona 03/14/2023 10:32 AM

## 2023-03-14 NOTE — Progress Notes (Signed)
Pt. Belongings returned from security by Nelly Laurence, RN.  Wallet, money clip, and cell phone all returned and patient signed yellow sheet verifying that his belongings were returned.

## 2023-03-14 NOTE — Progress Notes (Signed)
Patient ID: Steven [name], male   DOB: [birth year], 67 y.o.   MRN: [id number] HeartMate 3 Rounding Note  Subjective:    Feels better today. Ambulated around ICU this am. Had BM overnight.  Still some dark red urine but thinner.  -307 cc yesterday. Wt up 5 lbs from yesterday. Doubt that is accurate.   LVAD INTERROGATION:  HeartMate IIl LVAD:  Flow 4.2 liters/min, speed 5550, power 4, PI 3.4.    Objective:    Vital Signs:   Temp:  [97.8 F (36.6 C)-98.5 F (36.9 C)] 97.9 F (36.6 C) (09/18 1351) Pulse Rate:  [76-93] 79 (09/18 1351) Resp:  [15-29] 20 (09/18 1351) BP: (78-115)/(67-101) 105/93 (09/18 1351) SpO2:  [93 %-99 %] 98 % (09/18 1351) Weight:  [71.8 kg] 71.8 kg (09/18 0300) Last BM Date : 03/14/23 Mean arterial Pressure 80-100  Intake/Output:   Intake/Output Summary (Last 24 hours) at 03/14/2023 1612 Last data filed at 03/14/2023 1200 Gross per 24 hour  Intake 1713.91 ml  Output 2300 ml  Net -586.09 ml     Physical Exam: General:  Well appearing. No resp difficulty HEENT: normal Cor: Distant heart sounds with LVAD hum present. Lungs: clear Abdomen: soft, nontender, nondistended. Good bowel sounds. Extremities: no edema Neuro: alert & orientedx3, cranial nerves grossly intact. moves all 4 extremities w/o difficulty. Affect pleasant  Telemetry: sinus with V-pacing  Labs: Basic Metabolic Panel: Recent Labs  Lab 03/10/23 0451 03/10/23 0459 03/10/23 1900 03/10/23 1910 03/11/23 0304 03/11/23 0525 03/11/23 1430 03/12/23 0330 03/12/23 0627 03/13/23 0433 03/13/23 1731 03/14/23 0521  NA 133*   < > 131*   < > 131*   < > 132* 131* 133* 130* 131* 136  K 4.4   < > 3.7   < > 4.0   < > 4.0 3.9 3.8 3.2* 3.7 3.9  CL 102  --  99  --  100  --  96* 95*  --  90* 93* 96*  CO2 21*  --  19*  --  21*  --  22 23  --  27 24 25   GLUCOSE 152*  --  169*  --  179*  --  162* 130*  --  225* 229* 128*  BUN 21  --  24*  --  26*  --  29* 34*  --  41* 43* 38*  CREATININE 1.50*  --   1.60*  --  1.58*  --  1.68* 1.72*  --  1.76* 1.74* 1.63*  CALCIUM 8.5*  --  8.6*  --  8.5*  --  8.7* 8.7*  --  8.3* 7.9* 8.8*  MG 2.5*  --  2.4  --  2.2  --   --  2.1  --  2.0  --  2.2  PHOS 3.4  --   --   --  2.2*  --   --  3.5  --  4.0  --  3.1   < > = values in this interval not displayed.    Liver Function Tests: Recent Labs  Lab 03/10/23 0451 03/11/23 0304 03/12/23 0330 03/13/23 0433 03/14/23 0521  AST 76* 66* 53* 51* 46*  ALT 21 21 20 25 29   ALKPHOS 45 53 69 61 68  BILITOT 5.9* 3.8* 2.3* 1.8* 1.9*  PROT 6.2* 6.3* 6.4* 5.7* 6.5  ALBUMIN 3.5 3.2* 3.0* 2.6* 2.8*   No results for input(s): "LIPASE", "AMYLASE" in the last 168 hours. No results for input(s): "AMMONIA" in the last 168 hours.  CBC:  Recent Labs  Lab 03/10/23 0451 03/10/23 0459 03/11/23 0304 03/11/23 0525 03/11/23 1751 03/12/23 0330 03/12/23 0627 03/13/23 0433 03/13/23 1731 03/14/23 0521  WBC 17.4*  --  18.5*  --  16.9* 15.4*  --  11.5*  --  12.8*  NEUTROABS 15.0*  --  16.2*  --   --  12.7*  --  9.2*  --  9.6*  HGB 8.5*   < > 8.5*   < > 8.3* 8.5* 8.5* 7.0* 8.0* 9.0*  HCT 25.6*   < > 25.0*   < > 25.1* 24.1* 25.0* 21.1* 24.2* 26.2*  MCV 91.4  --  90.6  --  95.1 90.9  --  90.9  --  91.3  PLT 121*  --  125*  --  136* 151  --  152  --  195   < > = values in this interval not displayed.    INR: Recent Labs  Lab 03/10/23 0451 03/11/23 0304 03/12/23 0330 03/13/23 0433 03/14/23 0521  INR 2.1* 1.9* 1.5* 1.6* 1.5*    Other results: EKG:   Imaging: ECHOCARDIOGRAM LIMITED  Result Date: 03/13/2023    ECHOCARDIOGRAM LIMITED REPORT   Patient Name:   Steven [name] [facility] Date of Exam: 03/13/2023 Medical Rec #:  [id number]    Height:       69.0 in Accession #:    [id number]   Weight:       153.4 lb Date of Birth:  03-19-1956    BSA:          1.846 m Patient Age:    67 years     BP:           0/0 mmHg Patient Gender: M            HR:           102 bpm. Exam Location:  Inpatient Procedure: Limited Echo, Cardiac Doppler  and Color Doppler Indications:    RAMP LVAD Evaluation  History:        Patient has prior history of Echocardiogram examinations, most                 recent 03/08/2023. CHF, CAD, Defibrillator; Risk                 Factors:Hypertension, Dyslipidemia and ETOH. HMIII LVAD                 Implanted 03/08/23.  Sonographer:    Irving Burton Senior RDCS Referring Phys: [id number] [facility]  Sonographer Comments: Ramp echo with Dr Shirlee Latch at bedside IMPRESSIONS  1. LVAD inflow cannula noted at apex. Left ventricular ejection fraction, by estimation, is <20%. The left ventricle has severely decreased function. The left ventricle demonstrates global hypokinesis.  2. Right ventricular systolic function is moderately reduced. The right ventricular size is mildly enlarged.  3. Right atrial size was mildly dilated.  4. The tricuspid valve is abnormal. Tricuspid valve regurgitation is moderate.  5. The aortic valve does not open. The aortic valve is tricuspid. No aortic insufficiency.  6. Ramp echo was done. Speed initially 5400, increased to 5500. Interventricular septum mid-line at 5500 with no aortic valve opening. Speed left at 5500 FINDINGS  Left Ventricle: LVAD inflow cannula noted at apex. Left ventricular ejection fraction, by estimation, is <20%. The left ventricle has severely decreased function. The left ventricle demonstrates global hypokinesis. The left ventricular internal cavity size was normal in size. There is no left ventricular hypertrophy. Right Ventricle: The right ventricular  size is mildly enlarged. Right ventricular systolic function is moderately reduced. Left Atrium: Left atrial size was normal in size. Right Atrium: Right atrial size was mildly dilated. Tricuspid Valve: The tricuspid valve is abnormal. Tricuspid valve regurgitation is moderate. Aortic Valve: The aortic valve does not open. The aortic valve is tricuspid. Aortic valve regurgitation is not visualized. Pulmonic Valve: The pulmonic valve was normal in  structure. Pulmonic valve regurgitation is trivial. Additional Comments: A device lead is visualized in the right ventricle. Spectral Doppler performed. Color Doppler performed.  Dalton [employer] Electronically signed by Wilfred Lacy Signature Date/Time: 03/13/2023/10:29:40 AM    Final      Medications:     Scheduled Medications:  aspirin EC  81 mg Oral Daily   Or   aspirin  81 mg Per Tube Daily   Or   aspirin  150 mg Rectal Daily   atorvastatin  80 mg Oral Daily   bisacodyl  10 mg Oral Daily   Or   bisacodyl  10 mg Rectal Daily   Chlorhexidine Gluconate Cloth  6 each Topical Daily   docusate sodium  200 mg Oral Daily   feeding supplement  237 mL Oral TID BM   finasteride  5 mg Oral Daily   insulin aspart  0-24 Units Subcutaneous TID WC   isosorbide-hydrALAZINE  1 tablet Oral TID   multivitamin with minerals  1 tablet Oral Daily   pantoprazole  40 mg Oral Daily   polyethylene glycol  17 g Oral Daily   sodium chloride flush  10-40 mL Intracatheter Q12H   sodium chloride flush  3 mL Intravenous Q12H   tamsulosin  0.4 mg Oral QPC supper   torsemide  20 mg Oral Daily   warfarin  4 mg Oral ONCE-1600   [START ON 03/15/2023] Warfarin - Pharmacist Dosing Inpatient   Does not apply q1600    Infusions:  sodium chloride 20 mL/hr at 03/10/23 2100   sodium chloride     amiodarone 30 mg/hr (03/14/23 1200)   lactated ringers     lactated ringers Stopped (03/10/23 0729)    PRN Medications: sodium chloride, dextrose, morphine injection, ondansetron (ZOFRAN) IV, mouth rinse, oxyCODONE, sodium chloride flush, sodium chloride flush, traMADol   Assessment:   POD 6 HM 3 LVAD   Acute on chronic combined systolic diastolic congestive heart failure due to ischemic cardiomyopathy with history of 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 Preop EF less than 20% with dilated LV to 7 cm. NYHA class IV CHF.   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   CAD: h/o large anterior MI 2013 treated  w/ DES to LAD    H/O VT s/p Medtronic ICD. Now turned off for surgery.  Plan/Discussion:    Hemodynamically stable.  Atrial fib on IV amio. Back in sinus this am. Wean amio to po.  INR 1.5. will need increase in Coumadin dose per pharmacy.  Creat down to 1.63.   Hematuria: follow.  Continue IS, ambulation  CIR eval in progress  Transfer to 2C.  I reviewed the LVAD parameters from today, and compared the results to the patient's prior recorded data.  No programming changes were made.  The LVAD is functioning within specified parameters.   Length of Stay: 16  Artez Regis K Anahid Eskelson 03/14/2023, 7:00 AM

## 2023-03-14 NOTE — Progress Notes (Signed)
Physical Therapy Treatment Patient Details Name: Steven [name] MRN: [id number] DOB: Dec 01, 1955 Today's Date: 03/14/2023   History of Present Illness Pt is 67 yo male admitted on 02/26/23 for acute on chronic systolic and diastolic CHF with low output.  S/P implantation of LVAD Heartmate 3  (9/12). PMHx includes but not limited to ICM, CAD, VT s/p medtronic ICD, HLD, apical mural thrombus, CKD, subdural hematoma.    PT Comments  Pt progressing well towards all goals. Pt progressed to use of RW this date however requires minA for safe walker management. Pt continues to require significant assist to manage LVAD controller and switching it from wall to batteries. Acute PT to cont to follow. Continue to recommend inpatient rehab program > 3hrs a day to aide in returning to indep.    If plan is discharge home, recommend the following: A little help with walking and/or transfers;A little help with bathing/dressing/bathroom   Can travel by private vehicle        Equipment Recommendations  Other (comment) (TBD)    Recommendations for Other Services       Precautions / Restrictions Precautions Precautions: Sternal;Other (comment) (LVAD) Precaution Comments: blood in urine Restrictions Weight Bearing Restrictions: Yes (sternal precautions) RUE Weight Bearing: Non weight bearing LUE Weight Bearing: Non weight bearing Other Position/Activity Restrictions: sternal prec     Mobility  Bed Mobility Overal bed mobility: Needs Assistance Bed Mobility: Supine to Sit     Supine to sit: Min assist     General bed mobility comments: minA for trunk elevation    Transfers Overall transfer level: Needs assistance Equipment used: Rolling walker (2 wheels) Transfers: Sit to/from Stand, Bed to chair/wheelchair/BSC Sit to Stand: Min assist           General transfer comment: use of heart pillow, minA to initiated rocking momentum and anterior weight shift to power up through legs     Ambulation/Gait Ambulation/Gait assistance: Min assist Gait Distance (Feet): 370 Feet Assistive device: Rolling walker (2 wheels) Gait Pattern/deviations: Step-through pattern, Decreased stride length, Trunk flexed Gait velocity: dec     General Gait Details: minA for walker management. decreased cadence, verbal cues to look forward, verbal cues to stay in walker during turns   Stairs             Wheelchair Mobility     Tilt Bed    Modified Rankin (Stroke Patients Only)       Balance Overall balance assessment: Needs assistance Sitting-balance support: No upper extremity supported, Feet supported Sitting balance-Leahy Scale: Good Sitting balance - Comments: no external support seated EOB   Standing balance support: During functional activity, Reliant on assistive device for balance Standing balance-Leahy Scale: Poor Standing balance comment: reliant on external support                            Cognition Arousal: Alert Behavior During Therapy: WFL for tasks assessed/performed, Flat affect Overall Cognitive Status: Within Functional Limits for tasks assessed                                 General Comments: delayed response time, difficulty problem solving switching LVAD from wall unit to batteries        Exercises      General Comments General comments (skin integrity, edema, etc.): VSS. pt continues to require constant directional verbal cues to switch LVAD from  wall unit to batteries in addition to physical assist      Pertinent Vitals/Pain Pain Assessment Pain Assessment: No/denies pain    Home Living                          Prior Function            PT Goals (current goals can now be found in the care plan section) Acute Rehab PT Goals Patient Stated Goal: not stated PT Goal Formulation: With patient Time For Goal Achievement: 03/23/23 Potential to Achieve Goals: Good Progress towards PT goals:  Progressing toward goals    Frequency    Min 1X/week      PT Plan      Co-evaluation PT/OT/SLP Co-Evaluation/Treatment: Yes Reason for Co-Treatment: To address functional/ADL transfers;Complexity of the patient's impairments (multi-system involvement);For patient/therapist safety PT goals addressed during session: Mobility/safety with mobility OT goals addressed during session: ADL's and self-care      AM-PAC PT "6 Clicks" Mobility   Outcome Measure  Help needed turning from your back to your side while in a flat bed without using bedrails?: A Lot Help needed moving from lying on your back to sitting on the side of a flat bed without using bedrails?: A Lot Help needed moving to and from a bed to a chair (including a wheelchair)?: Total Help needed standing up from a chair using your arms (e.g., wheelchair or bedside chair)?: Total Help needed to walk in hospital room?: Total Help needed climbing 3-5 steps with a railing? : Total 6 Click Score: 8    End of Session   Activity Tolerance: Patient tolerated treatment well Patient left: with call bell/phone within reach;with nursing/sitter in room;in chair Nurse Communication: Mobility status PT Visit Diagnosis: Other abnormalities of gait and mobility (R26.89);Other (comment)     Time: 1126-1204 PT Time Calculation (min) (ACUTE ONLY): 38 min  Charges:    $Gait Training: 23-37 mins $Therapeutic Activity: 8-22 mins PT General Charges $$ ACUTE PT VISIT: 1 Visit                     Lewis Shock, PT, DPT Acute Rehabilitation Services Secure chat preferred Office #: [phone]    Iona Hansen 03/14/2023, 2:22 PM

## 2023-03-14 NOTE — Progress Notes (Signed)
ANTICOAGULATION CONSULT NOTE - Initial Consult  Pharmacy Consult for warfarin management Indication:  LVAD  No Known Allergies  Patient Measurements: Height: 5\' 9"  (175.3 cm) Weight: 71.8 kg (158 lb 4.6 oz) IBW/kg (Calculated) : 70.7 Heparin Dosing Weight: 73.5 kg  Vital Signs: Temp: 97.8 F (36.6 C) (09/18 0800) Temp Source: Oral (09/18 0800) BP: 122/100 (09/18 0900) Pulse Rate: 79 (09/18 0900)  Labs: Recent Labs    03/12/23 0330 03/12/23 0627 03/13/23 0433 03/13/23 1731 03/14/23 0521  HGB 8.5*   < > 7.0* 8.0* 9.0*  HCT 24.1*   < > 21.1* 24.2* 26.2*  PLT 151  --  152  --  195  LABPROT 18.7*  --  18.8*  --  17.8*  INR 1.5*  --  1.6*  --  1.5*  CREATININE 1.72*  --  1.76* 1.74* 1.63*   < > = values in this interval not displayed.    Estimated Creatinine Clearance: 43.4 mL/min (A) (by C-G formula based on SCr of 1.63 mg/dL (H)).   Medical History: Past Medical History:  Diagnosis Date   Acute MI, anterior wall (HCC)    AICD (automatic cardioverter/defibrillator) present    CAD (coronary artery disease)    2D ECHO, 07/13/2011 - EF <25%, LV moderatelty dilated, LA moderately dilatedLEXISCAN, 12/14/2011 - moderate-severe perfusion defect seen in the basal anteroseptal, mid anterior, apicacl anterior, apical, apical inferior, and apical lateral regions, post-stress EF 25%, new EKG changes from baseline abnormalities   Cancer (HCC)    Prostate   CHF (congestive heart failure) (HCC) 2012   Hypertension 08/08/2021   Inguinal hernia, left    Pneumonia    November 2023   Pre-diabetes     Medications:  Medications Prior to Admission  Medication Sig Dispense Refill Last Dose   acetaminophen (TYLENOL) 500 MG tablet Take 1,000 mg by mouth every 6 (six) hours as needed for mild pain.   Past Week   amiodarone (PACERONE) 200 MG tablet Take 2 tablets (400 mg total) by mouth 2 (two) times daily for 14 days, THEN 1 tablet (200 mg total) 2 (two) times daily for 14 days. 84 tablet  0 02/25/2023 at am   atorvastatin (LIPITOR) 80 MG tablet Take 1 tablet (80 mg total) by mouth daily. 30 tablet 3 02/25/2023 at am   Cholecalciferol (VITAMIN D) 125 MCG (5000 UT) CAPS Take 5,000 Units by mouth daily.   02/25/2023 at am   dapagliflozin propanediol (FARXIGA) 10 MG TABS tablet Take 1 tablet (10 mg total) by mouth daily. 30 tablet 3 02/25/2023 at am   metoprolol succinate (TOPROL-XL) 25 MG 24 hr tablet Take 1 tablet (25 mg total) by mouth daily. 30 tablet 2 02/25/2023 at am   Multiple Vitamin (MULTIVITAMIN) tablet Take 1 tablet by mouth daily.   02/25/2023 at am   nitroGLYCERIN (NITROSTAT) 0.4 MG SL tablet Place 1 tablet (0.4 mg total) under the tongue every 5 (five) minutes as needed. Usual dose is q 5 minutes x 3 doses. 75 tablet 0 02/26/2023   potassium chloride SA (KLOR-CON M) 20 MEQ tablet Take 1 tablet (20 mEq total) by mouth daily. 30 tablet 2 02/25/2023 at am   sacubitril-valsartan (ENTRESTO) 24-26 MG Take 1 tablet by mouth 2 (two) times daily. 60 tablet 3 02/25/2023 at am   spironolactone (ALDACTONE) 25 MG tablet Take 0.5 tablets (12.5 mg total) by mouth every other day. TAKE EOD ON EVEN DAYS 15 tablet 6 02/24/2023 at am   tamsulosin (FLOMAX) 0.4 MG CAPS  capsule Take 0.4 mg by mouth in the morning and at bedtime.   02/25/2023 at am   torsemide (DEMADEX) 20 MG tablet Take 1 tablet by mouth every other day. 45 tablet 3 02/24/2023 at am   warfarin (COUMADIN) 5 MG tablet Take 1 tablet daily except 1/2 tablet on Wednesdays and Fridays or as directed. (Patient taking differently: Take 2.5-5 mg by mouth See admin instructions. Take 1 tablet by mouth every Tue and Thur, then take 1/2 tablet all other days) 90 tablet 1 02/25/2023 at 1230   ZENPEP 40000-126000 units CPEP Take 1 capsule by mouth 3 (three) times daily with meals.   02/25/2023 at pm   amiodarone (PACERONE) 200 MG tablet Take 1 tablet (200 mg) by mouth daily. 30 tablet 5    Scheduled:   aspirin EC  81 mg Oral Daily   Or   aspirin  81 mg Per Tube  Daily   Or   aspirin  150 mg Rectal Daily   atorvastatin  80 mg Oral Daily   bisacodyl  10 mg Oral Daily   Or   bisacodyl  10 mg Rectal Daily   Chlorhexidine Gluconate Cloth  6 each Topical Daily   docusate sodium  200 mg Oral Daily   feeding supplement  237 mL Oral TID BM   finasteride  5 mg Oral Daily   insulin aspart  0-24 Units Subcutaneous TID WC   isosorbide-hydrALAZINE  1 tablet Oral TID   multivitamin with minerals  1 tablet Oral Daily   pantoprazole  40 mg Oral Daily   polyethylene glycol  17 g Oral Daily   sodium chloride flush  10-40 mL Intracatheter Q12H   sodium chloride flush  3 mL Intravenous Q12H   tamsulosin  0.4 mg Oral QPC supper   torsemide  20 mg Oral Daily   warfarin  4 mg Oral ONCE-1600   Warfarin - Physician Dosing Inpatient   Does not apply q1600   Infusions:   sodium chloride 20 mL/hr at 03/10/23 2100   sodium chloride     amiodarone 60 mg/hr (03/14/23 0900)   lactated ringers     lactated ringers Stopped (03/10/23 0729)   PRN: sodium chloride, dextrose, morphine injection, ondansetron (ZOFRAN) IV, mouth rinse, oxyCODONE, sodium chloride flush, sodium chloride flush, traMADol  Assessment: Pt presented to [facility] ED on 02/26/2023 with shortness of breath and SpO2 of 78 on room air. Pt has PMH of ICM (LAD infarct), HFrEF (EF <20%), LVT, VT s/p ICD, HTN, CKD3, HLD  Pt underwent LVAD implantation on 9/12. Anticoagulation was held immediately post-procedure due to risk of major bleeding events. Warfarin was initiated 9/15. Pt's INR remains subtherapeutic this morning at 1.5. Pt's H/H is now stable after receiving 1 unit of pRBC yesterday morning. Pt is still having some hematuria that is being monitored. Pt is on an oral diet with increased appetite today  Goal of Therapy:  2.0-2.5 Monitor platelets by anticoagulation protocol: Yes   Plan:  Give warfarin 5 mg x1 Monitor daily INR, CBC, and s/sx of bleeding  Wilmer Floor, PharmD PGY2 Cardiology Pharmacy  Resident 03/14/2023,9:42 AM

## 2023-03-14 NOTE — Progress Notes (Signed)
LVAD Coordinator Rounding Note:  Admitted 02/26/23 due to CHF.   HM3 LVAD implanted on 03/08/23 by Dr Laneta Simmers under DT criteria.  9/12: HM3 LVAD Implant. Intra-op TEE LVEF 15%, RV normal  9/13: Extubated.  Driveline pulled back, 1 inch of Velour exposed.  9/14: Went into atrial fibrillation with RVR,  IV amiodarone started.  9/15: Hematuria.  9/17: Urology consulted for hematuria. Proscar added. Given 1 unit of blood. Ramp Echo speed increased to 5500.   Pt laying in bed on my arrival. States he walked the entire unit and sat in the recliner for a while. Denies complaints other than stating he is worn out from this morning. States his appetite is improving, and he is trying to increase his intake.   WBC 12.8. Afebrile. Continues with hematuria. Hgb stable at 9.0 this morning.   Plan for possible CIR when medically appropriate. Rehab coordinator following.  RAMP echo completed yesterday- tolerating speed increase to 5500.   Left voicemail message for pt's caregiver Suzette Battiest requesting call back to schedule VAD teaching on Friday.   Vital signs: Temp: 97.8 HR: 93 Doppler Pressure: 80 Auto BP: 94/83 (88) O2 Sat: 94% on RA Wt:155.6>169.5>162>153.4>159.3 lbs     LVAD interrogation reveals:  Speed: 5500 Flow: 4.2 Power: 4.1 w  PI: 3.5  Alarms: none  Events:  none  Hematocrit: 21  Fixed speed: 5500 Low speed limit: 5200   Drive Line:  Existing VAD dressing removed and site care performed using sterile technique. Drive line exit site cleaned with Chlora prep applicators x 2, allowed to dry, and gauze dressing with Silver strip applied. There is a single suture present. The driveline has been pulled out approx 1".  Exit site unincorporated, the velour is now exposed approx 1" at exit site. Scant amount of dried bloody drainage on previous silver strip. No redness, tenderness, foul odor or rash noted. Drive line anchor re-applied. Pt denies fever or chills. Continue to every other day  dressing changes. Next dressing change due 03/16/23 by nurse champion or VAD coordinator.      Labs:  LDH trend: 234>310>274>302  INR trend: 1.6>1.5>1.6>1.5  Anticoagulation Plan: -INR Goal: 2-2.5 -ASA Dose: 325 mg until INR therapeutic  Blood Products:  -Intra op: 03/08/23 1 PRBC 2 FFP 1 PLT 1 Cryo 450 cell saver  Post op: 03/08/23 1 PRBC 2 FFP 1 PLT 1 Cryo  03/09/23: 1 PRBC 03/13/23: 1 PRBC  Device: -Medtronic -Therapies: OFF--Spoke with Ivan with [employer] 9/18 regarding need to turn on therapies. He is off campus today, but representative will come to bedside 2C07 to turn on therapies and place report in the chart.   Arrythmias:   Respiratory: on RA  Infection:   Renal:  -CRT: 1.28>1.72>1.76>1.63  Gtts: Amiodarone 30 mg/hr  Patient Education: Pt was educated on use of mask and cap for sterile dressing changes on 9/16. Pt requesting quiet time to rest as he walked the entire unit and sat in the recliner this morning.  Left voicemail for pt's caregiver regarding need to schedule time to begin education at bedside.   Adverse Events on VAD: -  Plan/Recommendations:  1. Please page VAD coordinator with any equipment issues or driveline problems. 2. Continue every other day dressing changes using silver strip by VAD coordinator or nurse champion. Next dressing due 03/16/23.  Alyce Pagan RN VAD Coordinator  Office: [phone]  24/7 Pager: [phone]

## 2023-03-14 NOTE — Progress Notes (Signed)
Inpatient Rehab Admissions Coordinator:   I was able to speak with [name], and she confirms she will be pt's primary caregiver at discharge.  Note starting to wean amio today.  Will open insurance for prior auth request.    Estill Dooms, PT, DPT Admissions Coordinator [phone] 03/14/23  3:49 PM

## 2023-03-14 NOTE — Plan of Care (Signed)
Problem: Education: Goal: Knowledge of General Education information will improve Description: Including pain rating scale, medication(s)/side effects and non-pharmacologic comfort measures Outcome: Progressing   Problem: Health Behavior/Discharge Planning: Goal: Ability to manage health-related needs will improve Outcome: Progressing   Problem: Clinical Measurements: Goal: Ability to maintain clinical measurements within normal limits will improve Outcome: Progressing Goal: Will remain free from infection Outcome: Progressing Goal: Diagnostic test results will improve Outcome: Progressing Goal: Respiratory complications will improve Outcome: Progressing Goal: Cardiovascular complication will be avoided Outcome: Progressing   Problem: Activity: Goal: Risk for activity intolerance will decrease Outcome: Progressing   Problem: Nutrition: Goal: Adequate nutrition will be maintained Outcome: Progressing   Problem: Coping: Goal: Level of anxiety will decrease Outcome: Progressing   Problem: Elimination: Goal: Will not experience complications related to bowel motility Outcome: Progressing Goal: Will not experience complications related to urinary retention Outcome: Progressing   Problem: Pain Managment: Goal: General experience of comfort will improve Outcome: Progressing   Problem: Safety: Goal: Ability to remain free from injury will improve Outcome: Progressing   Problem: Skin Integrity: Goal: Risk for impaired skin integrity will decrease Outcome: Progressing   Problem: Education: Goal: Understanding of CV disease, CV risk reduction, and recovery process will improve Outcome: Progressing Goal: Individualized Educational Video(s) Outcome: Progressing   Problem: Activity: Goal: Ability to return to baseline activity level will improve Outcome: Progressing   Problem: Cardiovascular: Goal: Ability to achieve and maintain adequate cardiovascular perfusion  will improve Outcome: Progressing Goal: Vascular access site(s) Level 0-1 will be maintained Outcome: Progressing   Problem: Health Behavior/Discharge Planning: Goal: Ability to safely manage health-related needs after discharge will improve Outcome: Progressing   Problem: Education: Goal: Understanding of CV disease, CV risk reduction, and recovery process will improve Outcome: Progressing Goal: Individualized Educational Video(s) Outcome: Progressing   Problem: Activity: Goal: Ability to return to baseline activity level will improve Outcome: Progressing   Problem: Cardiovascular: Goal: Ability to achieve and maintain adequate cardiovascular perfusion will improve Outcome: Progressing Goal: Vascular access site(s) Level 0-1 will be maintained Outcome: Progressing   Problem: Health Behavior/Discharge Planning: Goal: Ability to safely manage health-related needs after discharge will improve Outcome: Progressing

## 2023-03-15 ENCOUNTER — Other Ambulatory Visit (HOSPITAL_COMMUNITY): Payer: Self-pay

## 2023-03-15 DIAGNOSIS — I5023 Acute on chronic systolic (congestive) heart failure: Secondary | ICD-10-CM | POA: Diagnosis not present

## 2023-03-15 LAB — BASIC METABOLIC PANEL
Anion gap: 10 (ref 5–15)
BUN: 28 mg/dL — ABNORMAL HIGH (ref 8–23)
CO2: 25 mmol/L (ref 22–32)
Calcium: 8.8 mg/dL — ABNORMAL LOW (ref 8.9–10.3)
Chloride: 98 mmol/L (ref 98–111)
Creatinine, Ser: 1.42 mg/dL — ABNORMAL HIGH (ref 0.61–1.24)
GFR, Estimated: 54 mL/min — ABNORMAL LOW (ref >60)
Glucose, Bld: 138 mg/dL — ABNORMAL HIGH (ref 70–99)
Potassium: 3.7 mmol/L (ref 3.5–5.1)
Sodium: 133 mmol/L — ABNORMAL LOW (ref 135–145)

## 2023-03-15 LAB — CBC WITH DIFFERENTIAL/PLATELET
Abs Immature Granulocytes: 0.15 10*3/uL — ABNORMAL HIGH (ref 0.00–0.07)
Basophils Absolute: 0 10*3/uL (ref 0.0–0.1)
Basophils Relative: 0 %
Eosinophils Absolute: 0.1 10*3/uL (ref 0.0–0.5)
Eosinophils Relative: 1 %
HCT: 24.6 % — ABNORMAL LOW (ref 39.0–52.0)
Hemoglobin: 8.4 g/dL — ABNORMAL LOW (ref 13.0–17.0)
Immature Granulocytes: 1 %
Lymphocytes Relative: 8 %
Lymphs Abs: 1.1 10*3/uL (ref 0.7–4.0)
MCH: 31.8 pg (ref 26.0–34.0)
MCHC: 34.1 g/dL (ref 30.0–36.0)
MCV: 93.2 fL (ref 80.0–100.0)
Monocytes Absolute: 1.8 10*3/uL — ABNORMAL HIGH (ref 0.1–1.0)
Monocytes Relative: 14 %
Neutro Abs: 9.7 10*3/uL — ABNORMAL HIGH (ref 1.7–7.7)
Neutrophils Relative %: 76 %
Platelets: 240 10*3/uL (ref 150–400)
RBC: 2.64 MIL/uL — ABNORMAL LOW (ref 4.22–5.81)
RDW: 15.4 % (ref 11.5–15.5)
WBC: 12.9 10*3/uL — ABNORMAL HIGH (ref 4.0–10.5)
nRBC: 0.9 % — ABNORMAL HIGH (ref 0.0–0.2)

## 2023-03-15 LAB — GLUCOSE, CAPILLARY
Glucose-Capillary: 110 mg/dL — ABNORMAL HIGH (ref 70–99)
Glucose-Capillary: 183 mg/dL — ABNORMAL HIGH (ref 70–99)
Glucose-Capillary: 103 mg/dL — ABNORMAL HIGH (ref 70–99)
Glucose-Capillary: 149 mg/dL — ABNORMAL HIGH (ref 70–99)

## 2023-03-15 LAB — BLOOD GAS, ARTERIAL
Acid-Base Excess: 4.7 mmol/L — ABNORMAL HIGH (ref 0.0–2.0)
Bicarbonate: 27.1 mmol/L (ref 20.0–28.0)
O2 Saturation: 97.1 %
Patient temperature: 36.7
pCO2 arterial: 31 mmHg — ABNORMAL LOW (ref 32–48)
pH, Arterial: 7.56 — ABNORMAL HIGH (ref 7.35–7.45)
pO2, Arterial: 71 mmHg — ABNORMAL LOW (ref 83–108)

## 2023-03-15 LAB — PROTIME-INR
INR: 1.5 — ABNORMAL HIGH (ref 0.8–1.2)
Prothrombin Time: 18.1 seconds — ABNORMAL HIGH (ref 11.4–15.2)

## 2023-03-15 LAB — COOXEMETRY PANEL
Carboxyhemoglobin: 2.5 % — ABNORMAL HIGH (ref 0.5–1.5)
Methemoglobin: <0.7 % (ref 0.0–1.5)
O2 Saturation: 61.2 %
Total hemoglobin: 8.7 g/dL — ABNORMAL LOW (ref 12.0–16.0)

## 2023-03-15 LAB — PHOSPHORUS: Phosphorus: 3.2 mg/dL (ref 2.5–4.6)

## 2023-03-15 LAB — LACTATE DEHYDROGENASE: LDH: 318 U/L — ABNORMAL HIGH (ref 98–192)

## 2023-03-15 LAB — BRAIN NATRIURETIC PEPTIDE: B Natriuretic Peptide: 543.6 pg/mL — ABNORMAL HIGH (ref 0.0–100.0)

## 2023-03-15 LAB — MAGNESIUM: Magnesium: 2.3 mg/dL (ref 1.7–2.4)

## 2023-03-15 MED ORDER — WARFARIN SODIUM 5 MG PO TABS
5.0000 mg | ORAL_TABLET | Freq: Once | ORAL | Status: AC
  Administered 2023-03-15: 5 mg via ORAL
  Filled 2023-03-15: qty 1

## 2023-03-15 MED ORDER — TRAZODONE HCL 50 MG PO TABS
50.0000 mg | ORAL_TABLET | Freq: Every evening | ORAL | Status: DC | PRN
  Administered 2023-03-15 – 2023-03-19 (×5): 50 mg via ORAL
  Filled 2023-03-15 (×5): qty 1

## 2023-03-15 MED ORDER — POTASSIUM CHLORIDE CRYS ER 20 MEQ PO TBCR
40.0000 meq | EXTENDED_RELEASE_TABLET | Freq: Once | ORAL | Status: AC
  Administered 2023-03-15: 40 meq via ORAL
  Filled 2023-03-15: qty 2

## 2023-03-15 MED ORDER — AMIODARONE HCL 200 MG PO TABS
200.0000 mg | ORAL_TABLET | Freq: Two times a day (BID) | ORAL | Status: DC
  Administered 2023-03-15 – 2023-03-20 (×11): 200 mg via ORAL
  Filled 2023-03-15 (×11): qty 1

## 2023-03-15 MED ORDER — LOSARTAN POTASSIUM 25 MG PO TABS
12.5000 mg | ORAL_TABLET | Freq: Every day | ORAL | Status: DC
  Administered 2023-03-15 – 2023-03-16 (×2): 12.5 mg via ORAL
  Filled 2023-03-15 (×2): qty 1

## 2023-03-15 MED ORDER — ENSURE ENLIVE PO LIQD
237.0000 mL | ORAL | Status: DC
  Administered 2023-03-16 – 2023-03-20 (×4): 237 mL via ORAL

## 2023-03-15 MED ORDER — WARFARIN SODIUM 4 MG PO TABS: 4.0000 mg | ORAL_TABLET | Freq: Once | ORAL | Status: DC

## 2023-03-15 MED FILL — Magnesium Sulfate Inj 50%: INTRAMUSCULAR | Qty: 10 | Status: AC

## 2023-03-15 MED FILL — Potassium Chloride Inj 2 mEq/ML: INTRAVENOUS | Qty: 40 | Status: AC

## 2023-03-15 MED FILL — Heparin Sodium (Porcine) Inj 1000 Unit/ML: Qty: 1000 | Status: AC

## 2023-03-15 NOTE — Progress Notes (Signed)
Inpatient Rehab Admissions Coordinator:   Awaiting determination from insurance regarding CIR prior auth request.  Will follow.   Estill Dooms, PT, DPT Admissions Coordinator [phone] 03/15/23  9:15 AM

## 2023-03-15 NOTE — Progress Notes (Addendum)
ANTICOAGULATION CONSULT NOTE - Initial Consult  Pharmacy Consult for warfarin management Indication:  LVAD  No Known Allergies  Patient Measurements: Height: 5\' 9"  (175.3 cm) Weight: 69.6 kg (153 lb 7 oz) IBW/kg (Calculated) : 70.7 Heparin Dosing Weight: 73.5 kg  Vital Signs: Temp: 98 F (36.7 C) (09/19 0715) Temp Source: Oral (09/19 0715) BP: 91/74 (09/19 0715) Pulse Rate: 80 (09/18 2311)  Labs: Recent Labs    03/13/23 0433 03/13/23 1731 03/14/23 0521 03/15/23 0105  HGB 7.0* 8.0* 9.0* 8.4*  HCT 21.1* 24.2* 26.2* 24.6*  PLT 152  --  195 240  LABPROT 18.8*  --  17.8* 18.1*  INR 1.6*  --  1.5* 1.5*  CREATININE 1.76* 1.74* 1.63*  --     Estimated Creatinine Clearance: 42.7 mL/min (A) (by C-G formula based on SCr of 1.63 mg/dL (H)).   Medical History: Past Medical History:  Diagnosis Date   Acute MI, anterior wall (HCC)    AICD (automatic cardioverter/defibrillator) present    CAD (coronary artery disease)    2D ECHO, 07/13/2011 - EF <25%, LV moderatelty dilated, LA moderately dilatedLEXISCAN, 12/14/2011 - moderate-severe perfusion defect seen in the basal anteroseptal, mid anterior, apicacl anterior, apical, apical inferior, and apical lateral regions, post-stress EF 25%, new EKG changes from baseline abnormalities   Cancer (HCC)    Prostate   CHF (congestive heart failure) (HCC) 2012   Hypertension 08/08/2021   Inguinal hernia, left    Pneumonia    November 2023   Pre-diabetes     Medications:  Medications Prior to Admission  Medication Sig Dispense Refill Last Dose   acetaminophen (TYLENOL) 500 MG tablet Take 1,000 mg by mouth every 6 (six) hours as needed for mild pain.   Past Week   amiodarone (PACERONE) 200 MG tablet Take 2 tablets (400 mg total) by mouth 2 (two) times daily for 14 days, THEN 1 tablet (200 mg total) 2 (two) times daily for 14 days. 84 tablet 0 02/25/2023 at am   atorvastatin (LIPITOR) 80 MG tablet Take 1 tablet (80 mg total) by mouth daily.  30 tablet 3 02/25/2023 at am   Cholecalciferol (VITAMIN D) 125 MCG (5000 UT) CAPS Take 5,000 Units by mouth daily.   02/25/2023 at am   dapagliflozin propanediol (FARXIGA) 10 MG TABS tablet Take 1 tablet (10 mg total) by mouth daily. 30 tablet 3 02/25/2023 at am   metoprolol succinate (TOPROL-XL) 25 MG 24 hr tablet Take 1 tablet (25 mg total) by mouth daily. 30 tablet 2 02/25/2023 at am   Multiple Vitamin (MULTIVITAMIN) tablet Take 1 tablet by mouth daily.   02/25/2023 at am   nitroGLYCERIN (NITROSTAT) 0.4 MG SL tablet Place 1 tablet (0.4 mg total) under the tongue every 5 (five) minutes as needed. Usual dose is q 5 minutes x 3 doses. 75 tablet 0 02/26/2023   potassium chloride SA (KLOR-CON M) 20 MEQ tablet Take 1 tablet (20 mEq total) by mouth daily. 30 tablet 2 02/25/2023 at am   sacubitril-valsartan (ENTRESTO) 24-26 MG Take 1 tablet by mouth 2 (two) times daily. 60 tablet 3 02/25/2023 at am   spironolactone (ALDACTONE) 25 MG tablet Take 0.5 tablets (12.5 mg total) by mouth every other day. TAKE EOD ON EVEN DAYS 15 tablet 6 02/24/2023 at am   tamsulosin (FLOMAX) 0.4 MG CAPS capsule Take 0.4 mg by mouth in the morning and at bedtime.   02/25/2023 at am   torsemide (DEMADEX) 20 MG tablet Take 1 tablet by mouth every other  day. 45 tablet 3 02/24/2023 at am   warfarin (COUMADIN) 5 MG tablet Take 1 tablet daily except 1/2 tablet on Wednesdays and Fridays or as directed. (Patient taking differently: Take 2.5-5 mg by mouth See admin instructions. Take 1 tablet by mouth every Tue and Thur, then take 1/2 tablet all other days) 90 tablet 1 02/25/2023 at 1230   ZENPEP 40000-126000 units CPEP Take 1 capsule by mouth 3 (three) times daily with meals.   02/25/2023 at pm   amiodarone (PACERONE) 200 MG tablet Take 1 tablet (200 mg) by mouth daily. 30 tablet 5    Scheduled:   aspirin EC  81 mg Oral Daily   Or   aspirin  81 mg Per Tube Daily   Or   aspirin  150 mg Rectal Daily   atorvastatin  80 mg Oral Daily   bisacodyl  10 mg Oral  Daily   Or   bisacodyl  10 mg Rectal Daily   Chlorhexidine Gluconate Cloth  6 each Topical Daily   docusate sodium  200 mg Oral Daily   feeding supplement  237 mL Oral TID BM   finasteride  5 mg Oral Daily   insulin aspart  0-24 Units Subcutaneous TID WC   isosorbide-hydrALAZINE  1 tablet Oral TID   multivitamin with minerals  1 tablet Oral Daily   pantoprazole  40 mg Oral Daily   polyethylene glycol  17 g Oral Daily   sodium chloride flush  10-40 mL Intracatheter Q12H   sodium chloride flush  3 mL Intravenous Q12H   tamsulosin  0.4 mg Oral QPC supper   torsemide  20 mg Oral Daily   warfarin  4 mg Oral ONCE-1600   Warfarin - Pharmacist Dosing Inpatient   Does not apply q1600   Infusions:   sodium chloride 20 mL/hr at 03/10/23 2100   sodium chloride     amiodarone 30 mg/hr (03/15/23 0455)   lactated ringers     lactated ringers Stopped (03/10/23 0729)   PRN: sodium chloride, dextrose, morphine injection, ondansetron (ZOFRAN) IV, mouth rinse, oxyCODONE, sodium chloride flush, sodium chloride flush, traMADol  Assessment: Pt presented to [facility] ED on 02/26/2023 with shortness of breath and SpO2 of 78 on room air. Pt has PMH of ICM (LAD infarct), HFrEF (EF <20%), LVT, VT s/p ICD, HTN, CKD3, HLD  Pt underwent LVAD implantation on 9/12. Anticoagulation was held immediately post-procedure due to risk of major bleeding events. Warfarin was initiated 9/15. Pt's INR remains subtherapeutic this morning at 1.5. Pt's H/H is stable after receiving. Pt is still having some hematuria that is improving and still being monitored. Pt is on an oral diet with continued good intake.  Goal of Therapy:  2.0-2.5 Monitor platelets by anticoagulation protocol: Yes   Plan:  Give warfarin 5 mg x1 Monitor daily INR, CBC, and s/sx of bleeding  Wilmer Floor, PharmD PGY2 Cardiology Pharmacy Resident 03/15/2023,9:01 AM

## 2023-03-15 NOTE — TOC Benefit Eligibility Note (Signed)
Patient Product/process development scientist completed.    The patient is insured through [facility]. Patient has Medicare and is not eligible for a copay card, but may be able to apply for patient assistance, if available.    Ran test claim for isosorbide-hydralazine (Bidil) 20-37.5 mg and the current 30 day co-pay is $0.00.   This test claim was processed through [facility]- copay amounts may vary at other pharmacies due to pharmacy/plan contracts, or as the patient moves through the different stages of their insurance plan.     Roland Earl, CPHT Pharmacy Technician III Certified Patient Advocate [facility] Patient Advocate Team Direct Number: [phone]  Fax: [phone]

## 2023-03-15 NOTE — Progress Notes (Addendum)
LVAD Coordinator Rounding Note:  Admitted 02/26/23 due to CHF.   HM3 LVAD implanted on 03/08/23 by Dr Laneta Simmers under DT criteria.  9/12: HM3 LVAD Implant. Intra-op TEE LVEF 15%, RV normal  9/13: Extubated.  Driveline pulled back, 1 inch of Velour exposed.  9/14: Went into atrial fibrillation with RVR,  IV amiodarone started.  9/15: Hematuria.  9/17: Urology consulted for hematuria. Proscar added. Given 1 unit of blood. Ramp Echo speed increased to 5500.   Pt up in the chair on my arrival.  States he walked the entire unit yesterday with the aid of a walker. States his appetite is improving, and he is trying to increase his intake.   WBC 12.9. Afebrile. Continues with hematuria. Hgb 8.4 this morning.   Plan for possible CIR when medically appropriate. Rehab coordinator following.  RAMP echo completed - tolerating speed increase to 5500.   Left voicemail message for pt's caregiver Suzette Battiest yesterday and then again this morning requesting call back to schedule VAD teaching for tomorrow. If we do not hear back from [name] we will proceed with doing pt education on Friday.  Vital signs: Temp: 98 HR: 83 Doppler Pressure: not documented Auto BP: 91/74 (81) O2 Sat: 97% on RA Wt:155.6>169.5>162>153.4>159.3>153.4 lbs     LVAD interrogation reveals:  Speed: 5500 Flow: 3.5 Power: 4.1 w  PI: 6.5  Alarms: none  Events:  1 today  Hematocrit: 24  Fixed speed: 5500 Low speed limit: 5200   Drive Line: CDI. Drive line anchor re-applied.  Continue to every other day dressing changes. Next dressing change due 03/16/23 by nurse champion or VAD coordinator.    Labs:  LDH trend: 234>310>274>302>318  INR trend: 1.6>1.5>1.6>1.5  Anticoagulation Plan: -INR Goal: 2-2.5 -ASA Dose: 81 mg   Blood Products:  -Intra op: 03/08/23 1 PRBC 2 FFP 1 PLT 1 Cryo 450 cell saver  Post op: 03/08/23 1 PRBC 2 FFP 1 PLT 1 Cryo  03/09/23: 1 PRBC 03/13/23: 1 PRBC  Device: -Medtronic -Therapies:  ON  -VF > 200BMP  Arrythmias: afib RVR-on amio gtt  Respiratory: on RA  Infection:   Renal:  -CRT: 1.28>1.72>1.76>1.63  Gtts: Amiodarone 30 mg/hr  Patient Education: Pt was educated on use of mask and cap for sterile dressing changes on 9/16. Pt requesting quiet time to rest as he walked the entire unit and sat in the recliner this morning.  Left voicemail x 2 for pt's caregiver regarding need to schedule time to begin education at bedside.   Adverse Events on VAD: -  Plan/Recommendations:  1. Please page VAD coordinator with any equipment issues or driveline problems. 2. Continue every other day dressing changes using silver strip by VAD coordinator or nurse champion. Next dressing due 03/16/23.  Carlton Adam RN VAD Coordinator  Office: [phone]  24/7 Pager: [phone]

## 2023-03-15 NOTE — Progress Notes (Signed)
CSW met with patient at bedside who shared he is doing well. Patient states he is making progress and continues with his recovery. CSW provided supportive intervention and will continue to follow for supportive needs. Lasandra Beech, LCSW, CCSW-MCS [phone]

## 2023-03-15 NOTE — Plan of Care (Signed)

## 2023-03-15 NOTE — Progress Notes (Signed)
Occupational Therapy Treatment Patient Details Name: Steven [name] MRN: [id number] DOB: Dec 09, 1955 Today's Date: 03/15/2023   History of present illness Pt is 67 yo male admitted on 02/26/23 for acute on chronic systolic and diastolic CHF with low output.  S/P implantation of LVAD Heartmate 3  (9/12). PMHx includes but not limited to ICM, CAD, VT s/p medtronic ICD, HLD, apical mural thrombus, CKD, subdural hematoma.   OT comments  OT instructed pt in techniques for increased safety and independence with ADLs, bed mobility in preparation for ADLs, and functional transfers all while following sternal precautions. Pt currently demonstrates ability to complete self feeding and grooming with Set up in sitting and UB/LB dressing with Min assist in sitting/lateral leans. Pt demonstrates ability to complete supine to sit transfer with Min assist and functional step-pivot transfers with a RW with Min assist to come to stand and Contact guard assist for remainder of the transfer. Pt requires cues for following sternal precautions and for compensatory techniques throughout functional tasks. Pt participated well in session and is making progress toward goals. Pt's VSS on RA throughout session with no alarming of system noted throughout session. Pt will benefit from continued acute skilled OT services to address deficits outlined below and increase safety and independence with functional tasks. Post acute discharge, pt will benefit from intensive inpatient skilled rehab services > 3 hours per day to maximize rehab potential.       If plan is discharge home, recommend the following:  A little help with walking and/or transfers;A lot of help with bathing/dressing/bathroom;Assistance with cooking/housework;Assist for transportation;Help with stairs or ramp for entrance   Equipment Recommendations  Other (comment) (defer to next level of care)    Recommendations for Other Services Rehab consult    Precautions /  Restrictions Precautions Precautions: Sternal;Other (comment) (LVAD) Restrictions Weight Bearing Restrictions: Yes RUE Weight Bearing: Non weight bearing LUE Weight Bearing: Non weight bearing Other Position/Activity Restrictions: sternal prec       Mobility Bed Mobility Overal bed mobility: Needs Assistance Bed Mobility: Supine to Sit     Supine to sit: Min assist     General bed mobility comments: Min assist for trunk elevation and min cues for following sternal precations during bed mobility    Transfers Overall transfer level: Needs assistance Equipment used: Rolling walker (2 wheels) Transfers: Sit to/from Stand, Bed to chair/wheelchair/BSC Sit to Stand: Min assist     Step pivot transfers: Min assist     General transfer comment: use of heart pillow, Min assist to initiated rocking momentum and anterior weight shift to power up through legs. Contact guard assist for step-pivot transfer with RW once in standing.     Balance Overall balance assessment: Needs assistance Sitting-balance support: No upper extremity supported, Feet supported Sitting balance-Leahy Scale: Good Sitting balance - Comments: sitting EOB   Standing balance support: Single extremity supported, Bilateral upper extremity supported, During functional activity, Reliant on assistive device for balance Standing balance-Leahy Scale: Poor Standing balance comment: reliant on external support                           ADL either performed or assessed with clinical judgement   ADL Overall ADL's : Needs assistance/impaired Eating/Feeding: Set up;Sitting Eating/Feeding Details (indicate cue type and reason): at EOB Grooming: Wash/dry hands;Wash/dry face;Oral care;Set up;Sitting Grooming Details (indicate cue type and reason): at EOB         Upper Body Dressing : Minimal  assistance;Sitting;Cueing for compensatory techniques Upper Body Dressing Details (indicate cue type and reason): at  EOB Lower Body Dressing: Minimal assistance;Cueing for compensatory techniques;Sitting/lateral leans Lower Body Dressing Details (indicate cue type and reason): at EOB Toilet Transfer: Minimal assistance;BSC/3in1;Rolling walker (2 wheels);Cueing for sequencing (step-pivot) Toilet Transfer Details (indicate cue type and reason): simulated to chair           General ADL Comments: Pt requiring mod cues to follow sternal precautions and for compensatory techniques during dressing tasks and sit/stand transfers.    Extremity/Trunk Assessment Upper Extremity Assessment Upper Extremity Assessment: Generalized weakness (within limits of sternal precations)   Lower Extremity Assessment Lower Extremity Assessment: Defer to PT evaluation        Vision       Perception     Praxis      Cognition Arousal: Alert Behavior During Therapy: WFL for tasks assessed/performed, Flat affect Overall Cognitive Status: Within Functional Limits for tasks assessed                                 General Comments: requires increased time for processing and problem sovling        Exercises      Shoulder Instructions       General Comments VSS on RA throughout session with no alarming of system noted throughout session.    Pertinent Vitals/ Pain       Pain Assessment Pain Assessment: No/denies pain Pain Intervention(s): Monitored during session  Home Living                                          Prior Functioning/Environment              Frequency  Min 1X/week        Progress Toward Goals  OT Goals(current goals can now be found in the care plan section)  Progress towards OT goals: Progressing toward goals  Acute Rehab OT Goals Patient Stated Goal: To get stronger  Plan      Co-evaluation                 AM-PAC OT "6 Clicks" Daily Activity     Outcome Measure   Help from another person eating meals?: A Little Help from  another person taking care of personal grooming?: A Little Help from another person toileting, which includes using toliet, bedpan, or urinal?: A Lot Help from another person bathing (including washing, rinsing, drying)?: A Lot Help from another person to put on and taking off regular upper body clothing?: A Little Help from another person to put on and taking off regular lower body clothing?: A Little 6 Click Score: 16    End of Session Equipment Utilized During Treatment: Rolling walker (2 wheels)  OT Visit Diagnosis: Unsteadiness on feet (R26.81);Other abnormalities of gait and mobility (R26.89);Other (comment) (Decreased activity tolerance)   Activity Tolerance Patient tolerated treatment well   Patient Left in chair;with call bell/phone within reach   Nurse Communication Mobility status;Other (comment) (Pt sitting up in recliner.)        Time: 4098-1191 OT Time Calculation (min): 25 min  Charges: OT General Charges $OT Visit: 1 Visit OT Treatments $Self Care/Home Management : 23-37 mins  Graelyn Bihl "Orson Eva., OTR/L, MA Acute Rehab [phone]   Lendon Colonel 03/15/2023, 5:38 PM

## 2023-03-15 NOTE — Progress Notes (Addendum)
Patient ID: Steven [name], male   DOB: 25-Jun-1956, 67 y.o.   MRN: [id number]   Advanced Heart Failure VAD Team Note  PCP-Cardiologist: Dr. Gala Romney   Subjective:    9/12: HM3 LVAD Implant. Intra-op TEE LVEF 15%, RV normal  9/13: Extubated.  Driveline pulled back, 1 inch of Velour exposed.  9/14: Went into atrial fibrillation with RVR,  IV amiodarone started.  9/15: Hematuria.  9/16: SR, amio cut back to 30 mg per hour 9/17: Urology consulted for hematuria. Proscar added. Given 1 unit of blood. Ramp Echo speed increased to 5500.   Yesterday transferred out of ICU. Amio cut back to 30 mg per hour.   Hgb 7>9 >8.4  Hematuria seems to be clearing.   Tbili 5.9 => 3.8=>2.3 =>1.98   LDH 310>274 >302 >318  Didn't sleep well. Wants a room with a better view.   LVAD INTERROGATION:  HeartMate III LVAD:   Flow 4.1  liters/min, speed 5500, power 4, PI 3.9   Objective:    Vital Signs:   Temp:  [97.8 F (36.6 C)-98.1 F (36.7 C)] 98 F (36.7 C) (09/19 0715) Pulse Rate:  [76-93] 80 (09/18 2311) Resp:  [19-25] 25 (09/19 0715) BP: (78-113)/(61-101) 91/74 (09/19 0715) SpO2:  [93 %-98 %] 97 % (09/19 0715) Weight:  [69.6 kg-74.9 kg] 69.6 kg (09/19 0556) Last BM Date : 03/14/23 Mean arterial Pressure  90-100 Intake/Output:   Intake/Output Summary (Last 24 hours) at 03/15/2023 0933 Last data filed at 03/15/2023 0556 Gross per 24 hour  Intake 442.04 ml  Output 1150 ml  Net -707.96 ml    CVP 6-7  Physical Exam   Physical Exam: GENERAL: No acute distress. In bed.  HEENT: normal  NECK: Supple, JVP 6-7  .  2+ bilaterally, no bruits.  No lymphadenopathy or thyromegaly appreciated.   CARDIAC:  Mechanical heart sounds with LVAD hum present. Sternal incision approximated.  LUNGS:  Clear to auscultation bilaterally.  ABDOMEN:  Soft, round, nontender, positive bowel sounds x4.     LVAD exit site: Dressing dry and intact.  No erythema or drainage.  Stabilization device present and accurately  applied.  Driveline dressing is being changed daily per sterile technique. EXTREMITIES:  Warm and dry, no cyanosis, clubbing, rash or edema . RUE PICC NEUROLOGIC:  Alert and oriented x 3.    No aphasia.  No dysarthria.  Affect pleasant.     Telemetry  A pacing 80  EKG    No new EKG to review   Labs   Basic Metabolic Panel: Recent Labs  Lab 03/11/23 0304 03/11/23 0525 03/11/23 1430 03/12/23 0330 03/12/23 0627 03/13/23 0433 03/13/23 1731 03/14/23 0521 03/15/23 0105  NA 131*   < > 132* 131* 133* 130* 131* 136  --   K 4.0   < > 4.0 3.9 3.8 3.2* 3.7 3.9  --   CL 100  --  96* 95*  --  90* 93* 96*  --   CO2 21*  --  22 23  --  27 24 25   --   GLUCOSE 179*  --  162* 130*  --  225* 229* 128*  --   BUN 26*  --  29* 34*  --  41* 43* 38*  --   CREATININE 1.58*  --  1.68* 1.72*  --  1.76* 1.74* 1.63*  --   CALCIUM 8.5*  --  8.7* 8.7*  --  8.3* 7.9* 8.8*  --   MG 2.2  --   --  2.1  --  2.0  --  2.2 2.3  PHOS 2.2*  --   --  3.5  --  4.0  --  3.1 3.2   < > = values in this interval not displayed.    Liver Function Tests: Recent Labs  Lab 03/10/23 0451 03/11/23 0304 03/12/23 0330 03/13/23 0433 03/14/23 0521  AST 76* 66* 53* 51* 46*  ALT 21 21 20 25 29   ALKPHOS 45 53 69 61 68  BILITOT 5.9* 3.8* 2.3* 1.8* 1.9*  PROT 6.2* 6.3* 6.4* 5.7* 6.5  ALBUMIN 3.5 3.2* 3.0* 2.6* 2.8*   No results for input(s): "LIPASE", "AMYLASE" in the last 168 hours. No results for input(s): "AMMONIA" in the last 168 hours.  CBC: Recent Labs  Lab 03/11/23 0304 03/11/23 0525 03/11/23 1751 03/12/23 0330 03/12/23 0627 03/13/23 0433 03/13/23 1731 03/14/23 0521 03/15/23 0105  WBC 18.5*  --  16.9* 15.4*  --  11.5*  --  12.8* 12.9*  NEUTROABS 16.2*  --   --  12.7*  --  9.2*  --  9.6* 9.7*  HGB 8.5*   < > 8.3* 8.5* 8.5* 7.0* 8.0* 9.0* 8.4*  HCT 25.0*   < > 25.1* 24.1* 25.0* 21.1* 24.2* 26.2* 24.6*  MCV 90.6  --  95.1 90.9  --  90.9  --  91.3 93.2  PLT 125*  --  136* 151  --  152  --  195 240   < >  = values in this interval not displayed.    INR: Recent Labs  Lab 03/11/23 0304 03/12/23 0330 03/13/23 0433 03/14/23 0521 03/15/23 0105  INR 1.9* 1.5* 1.6* 1.5* 1.5*    Other results: EKG:    Imaging   ECHOCARDIOGRAM LIMITED  Result Date: 03/13/2023    ECHOCARDIOGRAM LIMITED REPORT   Patient Name:   Steven [name] [facility] Date of Exam: 03/13/2023 Medical Rec #:  [id number]    Height:       69.0 in Accession #:    [id number]   Weight:       153.4 lb Date of Birth:  1955-12-11    BSA:          1.846 m Patient Age:    67 years     BP:           0/0 mmHg Patient Gender: M            HR:           102 bpm. Exam Location:  Inpatient Procedure: Limited Echo, Cardiac Doppler and Color Doppler Indications:    RAMP LVAD Evaluation  History:        Patient has prior history of Echocardiogram examinations, most                 recent 03/08/2023. CHF, CAD, Defibrillator; Risk                 Factors:Hypertension, Dyslipidemia and ETOH. HMIII LVAD                 Implanted 03/08/23.  Sonographer:    Irving Burton Senior RDCS Referring Phys: [phone] Eliot Ford [facility]  Sonographer Comments: Ramp echo with Dr Shirlee Latch at bedside IMPRESSIONS  1. LVAD inflow cannula noted at apex. Left ventricular ejection fraction, by estimation, is <20%. The left ventricle has severely decreased function. The left ventricle demonstrates global hypokinesis.  2. Right ventricular systolic function is moderately reduced. The right ventricular size is mildly enlarged.  3. Right atrial size was  mildly dilated.  4. The tricuspid valve is abnormal. Tricuspid valve regurgitation is moderate.  5. The aortic valve does not open. The aortic valve is tricuspid. No aortic insufficiency.  6. Ramp echo was done. Speed initially 5400, increased to 5500. Interventricular septum mid-line at 5500 with no aortic valve opening. Speed left at 5500 FINDINGS  Left Ventricle: LVAD inflow cannula noted at apex. Left ventricular ejection fraction, by estimation, is <20%. The left  ventricle has severely decreased function. The left ventricle demonstrates global hypokinesis. The left ventricular internal cavity size was normal in size. There is no left ventricular hypertrophy. Right Ventricle: The right ventricular size is mildly enlarged. Right ventricular systolic function is moderately reduced. Left Atrium: Left atrial size was normal in size. Right Atrium: Right atrial size was mildly dilated. Tricuspid Valve: The tricuspid valve is abnormal. Tricuspid valve regurgitation is moderate. Aortic Valve: The aortic valve does not open. The aortic valve is tricuspid. Aortic valve regurgitation is not visualized. Pulmonic Valve: The pulmonic valve was normal in structure. Pulmonic valve regurgitation is trivial. Additional Comments: A device lead is visualized in the right ventricle. Spectral Doppler performed. Color Doppler performed.  Avyn Coate [employer] Electronically signed by Wilfred Lacy Signature Date/Time: 03/13/2023/10:29:40 AM    Final      Medications:     Scheduled Medications:  aspirin EC  81 mg Oral Daily   Or   aspirin  81 mg Per Tube Daily   Or   aspirin  150 mg Rectal Daily   atorvastatin  80 mg Oral Daily   bisacodyl  10 mg Oral Daily   Or   bisacodyl  10 mg Rectal Daily   Chlorhexidine Gluconate Cloth  6 each Topical Daily   docusate sodium  200 mg Oral Daily   feeding supplement  237 mL Oral TID BM   finasteride  5 mg Oral Daily   insulin aspart  0-24 Units Subcutaneous TID WC   isosorbide-hydrALAZINE  1 tablet Oral TID   multivitamin with minerals  1 tablet Oral Daily   pantoprazole  40 mg Oral Daily   polyethylene glycol  17 g Oral Daily   sodium chloride flush  10-40 mL Intracatheter Q12H   sodium chloride flush  3 mL Intravenous Q12H   tamsulosin  0.4 mg Oral QPC supper   torsemide  20 mg Oral Daily   warfarin  4 mg Oral ONCE-1600   Warfarin - Pharmacist Dosing Inpatient   Does not apply q1600    Infusions:  sodium chloride 20 mL/hr at  03/10/23 2100   sodium chloride     amiodarone 30 mg/hr (03/15/23 0455)   lactated ringers     lactated ringers Stopped (03/10/23 0729)    PRN Medications: sodium chloride, dextrose, morphine injection, ondansetron (ZOFRAN) IV, mouth rinse, oxyCODONE, sodium chloride flush, sodium chloride flush, traMADol   Patient Profile   Evan P Pollick is a 67 y.o. 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    Admitted with acute on chronic systolic and diastolic CHF with low-output. Advanced Heart Failure consulted and workup started for advanced therapies. S/p HM3 LVAD 9/12.   Assessment/Plan:    1. Acute on chronic combined systolic and diastolic heart failure>>Low Output S/p HM3 LVAD on 9/12.  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 NYHA IV  on admission.  Echo 02/27/23: EF <20%, LV with GHK, RV mildly reduced, GIIDD, LA mod dilated, mild MR.  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 GDMT limited by renal function and hypotension.  S/P HM3 LVAD implant 9/12.  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 Intra-op TEE LVEF 15%, RV normal.  - Ramp ECHO 03/13/23. Speed increased to 5500. Pain controlled with tylenol.  - CO-OX remains stable.   - CVP 6. Continue torsemide 20 mg daily.   -  Continue BIDIL 1 tab three times a day.  - Will add losartan if with renal function stable.  - LDH stable.  - INR 1.5 . Pharmacy to start dosing coumadin.  - Continue 81 mg ASA 2. 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  - on statin  - on ASA  3. AKI on CKD stage 3: Creatinine baseline 1.6-1.8 BMET pending.  4. Mural thrombus: Warfarin.  5. H/o VT s/p Medtronic ICD: likely scar mediated - Continue amiodarone  6. Anemia: Post-op.  9/17 1UPRBCs.  Hgb 7>8>9>8.4    - Still with some hematuria   7. Atrial fibrillation:  -A Paced. Stop amio drip. Start amio 200 mg twice a day   - On coumadin . INR 1.5 8. Hematuria:  -Urology consulted. Started on proscar.  Suspect related to trauma with foley insertion.   Rehab Coordinator assessing for possible admit to CIR>    I reviewed the LVAD parameters from today, and compared the results to the patient's prior recorded data.  No programming changes were made.  The LVAD is functioning within specified parameters.  The patient performs LVAD self-test daily.  LVAD interrogation was negative for any significant power changes, alarms or PI events/speed drops.  LVAD equipment check completed and is in good working order.  Back-up equipment present.   LVAD education done on emergency procedures and precautions and reviewed exit site care.  Length of Stay: 17  Tonye Becket, NP 03/15/2023, 9:33 AM  VAD Team --- VAD ISSUES ONLY--- Pager [phone] (7am - 7am)  Advanced Heart Failure Team  Pager [phone] (M-F; 7a - 5p)  Please contact [facility] for night-coverage after hours (5p -7a ) and weekends on [email or web address]  Patient seen with NP, agree with the above note.   No complaints today.  INR still low at 1.5.  In NSR on amiodarone gtt.   General: Well appearing this am. NAD.  HEENT: Normal. Neck: Supple, JVP 7-8 cm. Carotids OK.  Cardiac:  Mechanical heart sounds with LVAD hum present.  Lungs:  CTAB, normal effort.  Abdomen:  NT, ND, no HSM. No bruits or masses. +BS  LVAD exit site: Well-healed and incorporated. Dressing dry and intact. No erythema or drainage. Stabilization device present and accurately applied. Driveline dressing changed daily per sterile technique. Extremities:  Warm and dry. No cyanosis, clubbing, rash, or edema.  Neuro:  Alert & oriented x 3. Cranial nerves grossly intact. Moves all 4 extremities w/o difficulty. Affect pleasant    Stable LVAD parameters.   Adjust warfarin to get INR up.   Hematuria clearing, hgb 8.4  today.   Transition amiodarone to po today.   Mobilize, hopefully to CIR soon.   Marca Ancona 03/15/2023 11:27 AM

## 2023-03-15 NOTE — Plan of Care (Signed)
  Problem: Education: Goal: Knowledge of General Education information will improve Description: Including pain rating scale, medication(s)/side effects and non-pharmacologic comfort measures Outcome: Progressing   Problem: Health Behavior/Discharge Planning: Goal: Ability to manage health-related needs will improve Outcome: Progressing   Problem: Clinical Measurements: Goal: Ability to maintain clinical measurements within normal limits will improve Outcome: Progressing Goal: Will remain free from infection Outcome: Progressing Goal: Diagnostic test results will improve Outcome: Progressing Goal: Respiratory complications will improve Outcome: Progressing Goal: Cardiovascular complication will be avoided Outcome: Progressing   Problem: Activity: Goal: Risk for activity intolerance will decrease Outcome: Progressing   Problem: Nutrition: Goal: Adequate nutrition will be maintained Outcome: Progressing   Problem: Coping: Goal: Level of anxiety will decrease Outcome: Progressing   Problem: Elimination: Goal: Will not experience complications related to bowel motility Outcome: Progressing Goal: Will not experience complications related to urinary retention Outcome: Progressing   Problem: Pain Managment: Goal: General experience of comfort will improve Outcome: Progressing   Problem: Safety: Goal: Ability to remain free from injury will improve Outcome: Progressing   Problem: Skin Integrity: Goal: Risk for impaired skin integrity will decrease Outcome: Progressing   Problem: Education: Goal: Understanding of CV disease, CV risk reduction, and recovery process will improve Outcome: Progressing Goal: Individualized Educational Video(s) Outcome: Progressing   Problem: Activity: Goal: Ability to return to baseline activity level will improve Outcome: Progressing   Problem: Cardiovascular: Goal: Ability to achieve and maintain adequate cardiovascular perfusion  will improve Outcome: Progressing Goal: Vascular access site(s) Level 0-1 will be maintained Outcome: Progressing   Problem: Health Behavior/Discharge Planning: Goal: Ability to safely manage health-related needs after discharge will improve Outcome: Progressing   Problem: Education: Goal: Understanding of CV disease, CV risk reduction, and recovery process will improve Outcome: Progressing Goal: Individualized Educational Video(s) Outcome: Progressing   Problem: Activity: Goal: Ability to return to baseline activity level will improve Outcome: Progressing   Problem: Cardiovascular: Goal: Ability to achieve and maintain adequate cardiovascular perfusion will improve Outcome: Progressing Goal: Vascular access site(s) Level 0-1 will be maintained Outcome: Progressing   Problem: Health Behavior/Discharge Planning: Goal: Ability to safely manage health-related needs after discharge will improve Outcome: Progressing   

## 2023-03-16 ENCOUNTER — Inpatient Hospital Stay (HOSPITAL_COMMUNITY): Payer: 59

## 2023-03-16 DIAGNOSIS — I5023 Acute on chronic systolic (congestive) heart failure: Secondary | ICD-10-CM | POA: Diagnosis not present

## 2023-03-16 LAB — CBC
HCT: 25.8 % — ABNORMAL LOW (ref 39.0–52.0)
HCT: 28.9 % — ABNORMAL LOW (ref 39.0–52.0)
Hemoglobin: 8.4 g/dL — ABNORMAL LOW (ref 13.0–17.0)
Hemoglobin: 9.3 g/dL — ABNORMAL LOW (ref 13.0–17.0)
MCH: 31.2 pg (ref 26.0–34.0)
MCH: 30.9 pg (ref 26.0–34.0)
MCHC: 32.6 g/dL (ref 30.0–36.0)
MCHC: 32.2 g/dL (ref 30.0–36.0)
MCV: 95.9 fL (ref 80.0–100.0)
MCV: 96 fL (ref 80.0–100.0)
Platelets: 284 10*3/uL (ref 150–400)
Platelets: 355 10*3/uL (ref 150–400)
RBC: 2.69 MIL/uL — ABNORMAL LOW (ref 4.22–5.81)
RBC: 3.01 MIL/uL — ABNORMAL LOW (ref 4.22–5.81)
RDW: 15.5 % (ref 11.5–15.5)
RDW: 15.8 % — ABNORMAL HIGH (ref 11.5–15.5)
WBC: 13.9 10*3/uL — ABNORMAL HIGH (ref 4.0–10.5)
WBC: 16.4 10*3/uL — ABNORMAL HIGH (ref 4.0–10.5)
nRBC: 0.4 % — ABNORMAL HIGH (ref 0.0–0.2)
nRBC: 0.5 % — ABNORMAL HIGH (ref 0.0–0.2)

## 2023-03-16 LAB — URINALYSIS, ROUTINE W REFLEX MICROSCOPIC

## 2023-03-16 LAB — GLUCOSE, CAPILLARY
Glucose-Capillary: 111 mg/dL — ABNORMAL HIGH (ref 70–99)
Glucose-Capillary: 139 mg/dL — ABNORMAL HIGH (ref 70–99)
Glucose-Capillary: 172 mg/dL — ABNORMAL HIGH (ref 70–99)
Glucose-Capillary: 62 mg/dL — ABNORMAL LOW (ref 70–99)
Glucose-Capillary: 97 mg/dL (ref 70–99)

## 2023-03-16 LAB — LACTATE DEHYDROGENASE: LDH: 306 U/L — ABNORMAL HIGH (ref 98–192)

## 2023-03-16 LAB — BASIC METABOLIC PANEL
Anion gap: 10 (ref 5–15)
BUN: 25 mg/dL — ABNORMAL HIGH (ref 8–23)
CO2: 25 mmol/L (ref 22–32)
Calcium: 8.6 mg/dL — ABNORMAL LOW (ref 8.9–10.3)
Chloride: 100 mmol/L (ref 98–111)
Creatinine, Ser: 1.37 mg/dL — ABNORMAL HIGH (ref 0.61–1.24)
GFR, Estimated: 56 mL/min — ABNORMAL LOW (ref >60)
Glucose, Bld: 109 mg/dL — ABNORMAL HIGH (ref 70–99)
Potassium: 3.9 mmol/L (ref 3.5–5.1)
Sodium: 135 mmol/L (ref 135–145)

## 2023-03-16 LAB — COOXEMETRY PANEL
Carboxyhemoglobin: 3.1 % — ABNORMAL HIGH (ref 0.5–1.5)
Methemoglobin: 1.2 % (ref 0.0–1.5)
O2 Saturation: 67.3 %
Total hemoglobin: 8.9 g/dL — ABNORMAL LOW (ref 12.0–16.0)

## 2023-03-16 LAB — PROCALCITONIN: Procalcitonin: <0.1 ng/mL

## 2023-03-16 LAB — URINALYSIS, MICROSCOPIC (REFLEX): RBC / HPF: >50 RBC/hpf (ref 0–5)

## 2023-03-16 LAB — PROTIME-INR
INR: 1.7 — ABNORMAL HIGH (ref 0.8–1.2)
Prothrombin Time: 20.2 seconds — ABNORMAL HIGH (ref 11.4–15.2)

## 2023-03-16 MED ORDER — LOSARTAN POTASSIUM 25 MG PO TABS
12.5000 mg | ORAL_TABLET | Freq: Once | ORAL | Status: AC
  Administered 2023-03-16: 12.5 mg via ORAL
  Filled 2023-03-16: qty 1

## 2023-03-16 MED ORDER — GUAIFENESIN-DM 100-10 MG/5ML PO SYRP
5.0000 mL | ORAL_SOLUTION | ORAL | Status: DC | PRN
  Administered 2023-03-16 – 2023-03-19 (×4): 5 mL via ORAL
  Filled 2023-03-16 (×4): qty 5

## 2023-03-16 MED ORDER — SODIUM CHLORIDE 0.9 % IV SOLN
2.0000 g | INTRAVENOUS | Status: DC
  Administered 2023-03-16 – 2023-03-19 (×4): 2 g via INTRAVENOUS
  Filled 2023-03-16 (×4): qty 20

## 2023-03-16 MED ORDER — POTASSIUM CHLORIDE CRYS ER 20 MEQ PO TBCR
20.0000 meq | EXTENDED_RELEASE_TABLET | Freq: Once | ORAL | Status: AC
  Administered 2023-03-16: 20 meq via ORAL
  Filled 2023-03-16: qty 1

## 2023-03-16 MED ORDER — WARFARIN SODIUM 4 MG PO TABS
4.0000 mg | ORAL_TABLET | Freq: Once | ORAL | Status: AC
  Administered 2023-03-16: 4 mg via ORAL
  Filled 2023-03-16: qty 1

## 2023-03-16 MED ORDER — LOSARTAN POTASSIUM 25 MG PO TABS
12.5000 mg | ORAL_TABLET | Freq: Every day | ORAL | Status: DC
  Administered 2023-03-17: 12.5 mg via ORAL
  Filled 2023-03-16: qty 1

## 2023-03-16 MED ORDER — ISOSORB DINITRATE-HYDRALAZINE 20-37.5 MG PO TABS
0.5000 | ORAL_TABLET | Freq: Three times a day (TID) | ORAL | Status: DC
  Administered 2023-03-16 – 2023-03-17 (×2): 0.5 via ORAL
  Filled 2023-03-16 (×4): qty 0.5

## 2023-03-16 MED ORDER — SPIRONOLACTONE 12.5 MG HALF TABLET
12.5000 mg | ORAL_TABLET | Freq: Every day | ORAL | Status: DC
  Administered 2023-03-16 – 2023-03-20 (×5): 12.5 mg via ORAL
  Filled 2023-03-16 (×5): qty 1

## 2023-03-16 MED ORDER — LOSARTAN POTASSIUM 25 MG PO TABS: 25.0000 mg | ORAL_TABLET | Freq: Every day | ORAL | Status: DC

## 2023-03-16 NOTE — Progress Notes (Signed)
Physical Therapy Treatment Patient Details Name: Steven [name] MRN: [id number] DOB: [birth year] Today's Date: 03/16/2023   History of Present Illness Pt is 67 yo male admitted on 02/26/23 for acute on chronic systolic and diastolic CHF with low output.  S/P implantation of LVAD Heartmate 3  (9/12). PMHx includes but not limited to ICM, CAD, VT s/p medtronic ICD, HLD, apical mural thrombus, CKD, subdural hematoma.    PT Comments  Pt is progressing well toward goals.  Emphasis on LVAD education including battery changeover, standing exercise, safe sit to stand progression over 5-7 stands, transitions to/from EOB x2 and progression of gait stability/stamina.     If plan is discharge home, recommend the following: A little help with walking and/or transfers;A little help with bathing/dressing/bathroom   Can travel by private vehicle        Equipment Recommendations  Other (comment) (TBD)    Recommendations for Other Services       Precautions / Restrictions Precautions Precautions: Sternal;Other (comment) (LVAD) Precaution Comments: blood in urine Restrictions Weight Bearing Restrictions: Yes (sternal precautions) RUE Weight Bearing: Non weight bearing LUE Weight Bearing: Non weight bearing Other Position/Activity Restrictions: sternal prec     Mobility  Bed Mobility Overal bed mobility: Needs Assistance Bed Mobility: Sit to Supine       Sit to supine: Min assist   General bed mobility comments: minA for trunk elevation cues for technique    Transfers Overall transfer level: Needs assistance Equipment used: Rolling walker (2 wheels) Transfers: Sit to/from Stand, Bed to chair/wheelchair/BSC Sit to Stand: Min assist           General transfer comment: use of heart pillow, minA to initiated rocking momentum and anterior weight shift to power up through legs.  Pt was able to translate forward  better with each standing trial.    Ambulation/Gait Ambulation/Gait  assistance: Min assist Gait Distance (Feet): 300 Feet Assistive device: Rolling walker (2 wheels) Gait Pattern/deviations: Step-through pattern, Decreased stride length, Trunk flexed Gait velocity: dec Gait velocity interpretation: <1.31 ft/sec, indicative of household ambulator   General Gait Details: minA for walker management. decreased cadence, verbal cues to look forward, verbal cues to stay in walker during turns  Sats maintained at 97% overall.   Stairs             Wheelchair Mobility     Tilt Bed    Modified Rankin (Stroke Patients Only)       Balance Overall balance assessment: Needs assistance Sitting-balance support: No upper extremity supported, Feet supported Sitting balance-Leahy Scale: Good Sitting balance - Comments: no external support seated EOB   Standing balance support: During functional activity, Reliant on assistive device for balance Standing balance-Leahy Scale: Poor Standing balance comment: reliant on external support                            Cognition Arousal: Alert Behavior During Therapy: WFL for tasks assessed/performed, Flat affect Overall Cognitive Status: Within Functional Limits for tasks assessed                                 General Comments: difficulty problem solving switching LVAD from wall unit to batteries        Exercises General Exercises - Lower Extremity Hip ABduction/ADduction: AROM, Both, 10 reps, Standing Hip Flexion/Marching: AROM, Both, 10 reps, Standing Heel Raises: AROM, Both, 10 reps, Standing  Mini-Sqauts: AROM, Both, 10 reps, Standing    General Comments        Pertinent Vitals/Pain Pain Assessment Pain Assessment: No/denies pain Pain Intervention(s): Monitored during session    Home Living                          Prior Function            PT Goals (current goals can now be found in the care plan section) Acute Rehab PT Goals Patient Stated Goal: not  stated PT Goal Formulation: With patient Time For Goal Achievement: 03/23/23 Potential to Achieve Goals: Good Progress towards PT goals: Progressing toward goals    Frequency    Min 1X/week      PT Plan      Co-evaluation              AM-PAC PT "6 Clicks" Mobility   Outcome Measure  Help needed turning from your back to your side while in a flat bed without using bedrails?: A Little Help needed moving from lying on your back to sitting on the side of a flat bed without using bedrails?: A Lot Help needed moving to and from a bed to a chair (including a wheelchair)?: A Little Help needed standing up from a chair using your arms (e.g., wheelchair or bedside chair)?: A Little Help needed to walk in hospital room?: A Little Help needed climbing 3-5 steps with a railing? : A Lot 6 Click Score: 16    End of Session   Activity Tolerance: Patient tolerated treatment well Patient left: with call bell/phone within reach;with nursing/sitter in room;in chair Nurse Communication: Mobility status PT Visit Diagnosis: Other abnormalities of gait and mobility (R26.89);Other (comment)     Time: 2993-7169 PT Time Calculation (min) (ACUTE ONLY): 48 min  Charges:    $Gait Training: 8-22 mins $Therapeutic Exercise: 8-22 mins $Therapeutic Activity: 8-22 mins PT General Charges $$ ACUTE PT VISIT: 1 Visit                     03/16/2023  Jacinto Halim., PT Acute Rehabilitation Services [phone]  (office)   [name] 03/16/2023, 11:06 AM

## 2023-03-16 NOTE — Progress Notes (Signed)
CARDIAC REHAB PHASE I      Post OHS education including restrictions, heart healthy diabetic diet, sternal precautions, continued IS use and CRP2 reviewed. All questions and concerns addressed. Will refer to [facility] for CRP2. Plan for CIR once approved. PT/OT assigned for increased mobility. Encouraged mobility, increased PO intake and IS use. Will continue to follow.   [id number] Woodroe Chen, RN BSN 03/16/2023 10:17 AM

## 2023-03-16 NOTE — Plan of Care (Signed)
Problem: Education: Goal: Knowledge of General Education information will improve Description: Including pain rating scale, medication(s)/side effects and non-pharmacologic comfort measures Outcome: Progressing   Problem: Health Behavior/Discharge Planning: Goal: Ability to manage health-related needs will improve Outcome: Progressing   Problem: Clinical Measurements: Goal: Ability to maintain clinical measurements within normal limits will improve Outcome: Progressing Goal: Will remain free from infection Outcome: Progressing Goal: Diagnostic test results will improve Outcome: Progressing Goal: Respiratory complications will improve Outcome: Progressing Goal: Cardiovascular complication will be avoided Outcome: Progressing   Problem: Activity: Goal: Risk for activity intolerance will decrease Outcome: Progressing   Problem: Nutrition: Goal: Adequate nutrition will be maintained Outcome: Progressing   Problem: Coping: Goal: Level of anxiety will decrease Outcome: Progressing   Problem: Elimination: Goal: Will not experience complications related to bowel motility Outcome: Progressing Goal: Will not experience complications related to urinary retention Outcome: Progressing   Problem: Pain Managment: Goal: General experience of comfort will improve Outcome: Progressing   Problem: Safety: Goal: Ability to remain free from injury will improve Outcome: Progressing   Problem: Skin Integrity: Goal: Risk for impaired skin integrity will decrease Outcome: Progressing   Problem: Education: Goal: Understanding of CV disease, CV risk reduction, and recovery process will improve Outcome: Progressing Goal: Individualized Educational Video(s) Outcome: Progressing   Problem: Activity: Goal: Ability to return to baseline activity level will improve Outcome: Progressing   Problem: Cardiovascular: Goal: Ability to achieve and maintain adequate cardiovascular perfusion  will improve Outcome: Progressing Goal: Vascular access site(s) Level 0-1 will be maintained Outcome: Progressing   Problem: Health Behavior/Discharge Planning: Goal: Ability to safely manage health-related needs after discharge will improve Outcome: Progressing   Problem: Education: Goal: Understanding of CV disease, CV risk reduction, and recovery process will improve Outcome: Progressing Goal: Individualized Educational Video(s) Outcome: Progressing   Problem: Activity: Goal: Ability to return to baseline activity level will improve Outcome: Progressing   Problem: Cardiovascular: Goal: Ability to achieve and maintain adequate cardiovascular perfusion will improve Outcome: Progressing Goal: Vascular access site(s) Level 0-1 will be maintained Outcome: Progressing   Problem: Health Behavior/Discharge Planning: Goal: Ability to safely manage health-related needs after discharge will improve Outcome: Progressing

## 2023-03-16 NOTE — Progress Notes (Signed)
LVAD Coordinator Rounding Note:  Admitted 02/26/23 due to CHF.   HM3 LVAD implanted on 03/08/23 by Dr Laneta Simmers under DT criteria.  9/12: HM3 LVAD Implant. Intra-op TEE LVEF 15%, RV normal  9/13: Extubated.  Driveline pulled back, 1 inch of Velour exposed.  9/14: Went into atrial fibrillation with RVR,  IV amiodarone started.  9/15: Hematuria.  9/17: Urology consulted for hematuria. Proscar added. Given 1 unit of blood. Ramp Echo speed increased to 5500.   Pt up in the chair on my arrival.  States he walked the entire unit yesterday with the aid of a walker. States his appetite is improving, and he is trying to increase his intake.   WBC 13.9. Afebrile. Continues with gross hematuria. Hgb 8.4 this morning.   Plan for possible CIR when medically appropriate. Rehab coordinator following.  RAMP echo completed - tolerating speed increase to 5500.   VAD coordinators have left multiple voicemail messages for pt's caregiver Suzette Battiest  requesting a call back to schedule VAD teaching. We have not heard from her, because of this we will begin d/c teaching with the pt only this afternoon.  Vital signs: Temp: 98.9 HR: 83 Doppler Pressure: not documented Auto BP: 105/92 (98) O2 Sat: 97% on RA Wt:155.6>169.5>162>153.4>159.3>153.4>151.9 lbs     LVAD interrogation reveals:  Speed: 5500 Flow: 4.2 Power: 4.1 w  PI: 3.8  Alarms: none  Events:  1 today; 6 yest Hematocrit: 25  Fixed speed: 5500 Low speed limit: 5200   Drive Line: CDI. Drive line anchor re-applied.  Continue to every other day dressing changes. Next dressing change due 03/16/23 by nurse champion or VAD coordinator.    Labs:  LDH trend: 234>310>274>302>318>306  INR trend: 1.6>1.5>1.6>1.5>1.7  Anticoagulation Plan: -INR Goal: 2-2.5 -ASA Dose: 81 mg   Blood Products:  -Intra op: 03/08/23 1 PRBC 2 FFP 1 PLT 1 Cryo 450 cell saver  Post op: 03/08/23 1 PRBC 2 FFP 1 PLT 1 Cryo  03/09/23: 1 PRBC 03/13/23: 1  PRBC  Device: -Medtronic -Therapies: ON  -VF > 200BMP  Arrythmias: afib RVR-on amio gtt  Respiratory: on RA  Infection:   Renal:  -CRT: 1.28>1.72>1.76>1.63>1.37  Patient Education: Pt was educated on use of mask and cap for sterile dressing changes on 9/16. Pt requesting quiet time to rest as he walked the entire unit and sat in the recliner this morning.  Left voicemail x 3 for pt's caregiver regarding need to schedule time to begin education at bedside.   Adverse Events on VAD: -  Plan/Recommendations:  1. Please page VAD coordinator with any equipment issues or driveline problems. 2. Continue every other day dressing changes using silver strip by VAD coordinator or nurse champion. Next dressing due 03/16/23.  Carlton Adam RN VAD Coordinator  Office: [phone]  24/7 Pager: [phone]

## 2023-03-16 NOTE — Progress Notes (Signed)
Inpatient Rehab Admissions Coordinator:   Continue to await determination from [facility] [employer] regarding CIR prior auth request.  Will follow.   Estill Dooms, PT, DPT Admissions Coordinator [phone] 03/16/23  10:16 AM

## 2023-03-16 NOTE — Progress Notes (Signed)
CSW met at bedside with patient who shares that he contacted his caregiver Suzette Battiest to inform her that the staff have been trying to reach her for education/training. CSW informed patient that Carlton Adam, VAD Coordinator did reach her this morning and scheduled some time for training. Patient was relived with the news. He states he is a bit tired and hopeful to take a nap. CSW provided support and will continue to follow throughout hospitalization. Lasandra Beech, LCSW, CCSW-MCS [phone]

## 2023-03-16 NOTE — Progress Notes (Signed)
Brief LVAD coordinator rounding note:   VAD coordinator spoke with pt's caregiver Suzette Battiest regarding need to schedule discharge teaching. She is unable to come today. Scheduled teaching for Monday 9/23 at 9 AM.   Patient Education: Discharge education binder brought to bedside. Requested pt review over the weekend in preparation for discharge teaching on Monday with [name].  Reviewed system controller buttons and functions. Pt verbalized how to perform self test.  Reviewed battery charger functions and maintenance.  Briefly reviewed need to fill out red folder flowsheet daily.  Discussed importance of black back up bag, equipment needed in bag, and that this needs to be with him at all times and not be left in the car.   Drive Line:  Existing VAD dressing removed and site care performed using sterile technique. Drive line exit site cleaned with Chlora prep applicators x 2, allowed to dry, and gauze dressing with Silver strip applied. There is a single suture present. The driveline has been pulled out approx 1".  Exit site unincorporated, the velour is now exposed approx 1" at exit site. Scant amount of dried bloody drainage on previous silver strip. No redness, tenderness, foul odor or rash noted. Drive line anchor re-applied. Pt denies fever or chills. Advance to Monday-Wednesday-Friday dressing changes. Next dressing change due 03/19/23 by nurse champion or VAD coordinator.     Plan/Recommendations:  1. Please page VAD coordinator with any equipment issues or driveline problems. 2. Advance to Monday-Wednesday-Friday dressing changes using silver strip by VAD coordinator or nurse champion. Next dressing due 03/19/23.  Alyce Pagan RN VAD Coordinator  Office: [phone]  24/7 Pager: [phone]

## 2023-03-16 NOTE — Progress Notes (Addendum)
Patient ID: Steven [name], male   DOB: 05-16-1956, 67 y.o.   MRN: [id number]   Advanced Heart Failure VAD Team Note  PCP-Cardiologist: Dr. Gala Romney   Subjective:    9/12: HM3 LVAD Implant. Intra-op TEE LVEF 15%, RV normal  9/13: Extubated.  Driveline pulled back, 1 inch of Velour exposed.  9/14: Went into atrial fibrillation with RVR,  IV amiodarone started.  9/15: Hematuria.  9/16: SR, amio cut back to 30 mg per hour 9/17: Urology consulted for hematuria. Proscar added. Given 1 unit of blood. Ramp Echo speed increased to 5500.   Co-ox 67%. CVP 10. MAPs low 100s   Low grade fever overnight, temp 99.1. WBC up trending, 12>>13K. + productive cough, rust colored sputum. CXR pending.   SCr continues to trend down, 1.63>>1.42>>1.37   C/w hematuria. Hgb stable at 8.4. INR 1.7  Tbili 5.9 => 3.8=>2.3 =>1.98>>pending    LDH 310>274 >302 >318>>306   Sitting up in chair. Complaining of cough. Appetite not great.   LVAD INTERROGATION:  HeartMate III LVAD:   Flow 4.2  liters/min, speed 5500, power 4.1 PI 3.3. 1 PI event    Objective:    Vital Signs:   Temp:  [97.9 F (36.6 C)-99.1 F (37.3 C)] 97.9 F (36.6 C) (09/20 0552) Pulse Rate:  [80] 80 (09/19 2009) Resp:  [14-24] 20 (09/20 0552) BP: (98-149)/(76-110) 116/99 (09/20 0552) SpO2:  [96 %-99 %] 98 % (09/20 0552) Weight:  [68.9 kg] 68.9 kg (09/20 0611) Last BM Date : 03/14/23 Mean arterial Pressure  90-100 Intake/Output:   Intake/Output Summary (Last 24 hours) at 03/16/2023 0819 Last data filed at 03/16/2023 0000 Gross per 24 hour  Intake --  Output 1550 ml  Net -1550 ml     Physical Exam   CVP 10  GENERAL: sitting up in chair. Fatigued appearing. + cough. Normal WOB HEENT: normal  NECK: Supple, JVP ~10 cm  .  2+ bilaterally, no bruits.  No lymphadenopathy or thyromegaly appreciated.   CARDIAC:  Mechanical heart sounds with LVAD hum present. Sternal incision approximated.  LUNGS:  decreased BS at the bases + crackles   ABDOMEN:  Soft, round, nontender, positive bowel sounds x4.     LVAD exit site: Dressing dry and intact.  No erythema or drainage.  Stabilization device present and accurately applied.  Driveline dressing is being changed daily per sterile technique. EXTREMITIES:  Warm and dry, no cyanosis, clubbing, rash or edema . RUE PICC NEUROLOGIC:  Alert and oriented x 3.    No aphasia.  No dysarthria.  Affect pleasant. GU: [place] + gross hematuria      Telemetry  A pacing 80  EKG    No new EKG to review   Labs   Basic Metabolic Panel: Recent Labs  Lab 03/11/23 0304 03/11/23 0525 03/12/23 0330 03/12/23 2952 03/13/23 0433 03/13/23 1731 03/14/23 0521 03/15/23 0105 03/15/23 1000 03/16/23 0602  NA 131*   < > 131*   < > 130* 131* 136  --  133* 135  K 4.0   < > 3.9   < > 3.2* 3.7 3.9  --  3.7 3.9  CL 100   < > 95*  --  90* 93* 96*  --  98 100  CO2 21*   < > 23  --  27 24 25   --  25 25  GLUCOSE 179*   < > 130*  --  225* 229* 128*  --  138* 109*  BUN 26*   < >  34*  --  41* 43* 38*  --  28* 25*  CREATININE 1.58*   < > 1.72*  --  1.76* 1.74* 1.63*  --  1.42* 1.37*  CALCIUM 8.5*   < > 8.7*  --  8.3* 7.9* 8.8*  --  8.8* 8.6*  MG 2.2  --  2.1  --  2.0  --  2.2 2.3  --   --   PHOS 2.2*  --  3.5  --  4.0  --  3.1 3.2  --   --    < > = values in this interval not displayed.    Liver Function Tests: Recent Labs  Lab 03/10/23 0451 03/11/23 0304 03/12/23 0330 03/13/23 0433 03/14/23 0521  AST 76* 66* 53* 51* 46*  ALT 21 21 20 25 29   ALKPHOS 45 53 69 61 68  BILITOT 5.9* 3.8* 2.3* 1.8* 1.9*  PROT 6.2* 6.3* 6.4* 5.7* 6.5  ALBUMIN 3.5 3.2* 3.0* 2.6* 2.8*   No results for input(s): "LIPASE", "AMYLASE" in the last 168 hours. No results for input(s): "AMMONIA" in the last 168 hours.  CBC: Recent Labs  Lab 03/11/23 0304 03/11/23 0525 03/12/23 0330 03/12/23 0627 03/13/23 0433 03/13/23 1731 03/14/23 0521 03/15/23 0105 03/16/23 0602  WBC 18.5*   < > 15.4*  --  11.5*  --  12.8* 12.9*  13.9*  NEUTROABS 16.2*  --  12.7*  --  9.2*  --  9.6* 9.7*  --   HGB 8.5*   < > 8.5*   < > 7.0* 8.0* 9.0* 8.4* 8.4*  HCT 25.0*   < > 24.1*   < > 21.1* 24.2* 26.2* 24.6* 25.8*  MCV 90.6   < > 90.9  --  90.9  --  91.3 93.2 95.9  PLT 125*   < > 151  --  152  --  195 240 284   < > = values in this interval not displayed.    INR: Recent Labs  Lab 03/12/23 0330 03/13/23 0433 03/14/23 0521 03/15/23 0105 03/16/23 0602  INR 1.5* 1.6* 1.5* 1.5* 1.7*    Other results: EKG:    Imaging   No results found.   Medications:     Scheduled Medications:  amiodarone  200 mg Oral BID   aspirin EC  81 mg Oral Daily   Or   aspirin  81 mg Per Tube Daily   Or   aspirin  150 mg Rectal Daily   atorvastatin  80 mg Oral Daily   bisacodyl  10 mg Oral Daily   Or   bisacodyl  10 mg Rectal Daily   Chlorhexidine Gluconate Cloth  6 each Topical Daily   docusate sodium  200 mg Oral Daily   feeding supplement  237 mL Oral Q24H   finasteride  5 mg Oral Daily   insulin aspart  0-24 Units Subcutaneous TID WC   isosorbide-hydrALAZINE  1 tablet Oral TID   losartan  12.5 mg Oral Daily   multivitamin with minerals  1 tablet Oral Daily   pantoprazole  40 mg Oral Daily   polyethylene glycol  17 g Oral Daily   sodium chloride flush  10-40 mL Intracatheter Q12H   sodium chloride flush  3 mL Intravenous Q12H   tamsulosin  0.4 mg Oral QPC supper   torsemide  20 mg Oral Daily   Warfarin - Pharmacist Dosing Inpatient   Does not apply q1600    Infusions:  sodium chloride 20 mL/hr at 03/10/23 2100  sodium chloride     lactated ringers     lactated ringers Stopped (03/10/23 0729)    PRN Medications: sodium chloride, dextrose, morphine injection, ondansetron (ZOFRAN) IV, mouth rinse, oxyCODONE, sodium chloride flush, sodium chloride flush, traMADol, traZODone   Patient Profile   Loney P Crom is a 67 y.o. male with chronic combined systolic and diastolic heart failure due to ICM, CAD, VT s/p  Medtronic ICD, HLD, apical mural thrombus, CKD Stage IIIa and h/o subdural hematoma.    Admitted with acute on chronic systolic and diastolic CHF with low-output. Advanced Heart Failure consulted and workup started for advanced therapies. S/p HM3 LVAD 9/12.   Assessment/Plan:    1. Acute on chronic combined systolic and diastolic heart failure>>Low Output S/p HM3 LVAD on 9/12.  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 NYHA IV on admission.  Echo 02/27/23: EF <20%, LV with GHK, RV mildly reduced, GIIDD, LA mod dilated, mild MR.  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 GDMT limited by renal function and hypotension.  S/P HM3 LVAD implant 9/12.  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 Intra-op TEE LVEF 15%, RV normal.  - Ramp ECHO 03/13/23. Speed increased to 5500. Pain controlled with tylenol.  - CO-OX remains stable.  MAPs low 100s  - CVP 10. Continue Torsemide 20 mg daily  - start Spiro 12.5 mg daily  - increase Losartan to 25 mg daily  - Continue BIDIL 1 tab three times a day. .  - LDH stable.  - INR 1.7. Pharmacy dosing coumadin.  - Continue 81 mg ASA 2. 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  - on statin  - on ASA  3. AKI on CKD stage 3: Creatinine baseline 1.6-1.8 - SCr down to 1.3 today  - follow w/ diuretics + GDMT titration  4. Mural thrombus: Warfarin.  5. H/o VT s/p Medtronic ICD: likely scar mediated - Continue amiodarone  6. Anemia: Post-op.  9/17 1UPRBCs.  Hgb 7>8>9>8.4>>8.4    - Still with some hematuria. Will follow  7. Atrial fibrillation:  -A Paced.  - continue amio 200 mg twice a day   - On coumadin . INR 1.7 8. Hematuria:  -Urology consulted. Started on proscar.  - Suspect related to trauma with foley insertion.  - continue to monitor  9. ID: mTemp overnight 99.1. WBC 13K. + productive cough. Suspect PNA. CXR pending. Check PCT 10. Deconditioning: cont PT/OT. -  CIR following    I reviewed the LVAD parameters from today, and compared the results to the patient's prior recorded data.  No programming changes were made.  The LVAD is functioning within specified parameters.  The patient performs LVAD self-test daily.  LVAD interrogation was negative for any significant power changes, alarms or PI events/speed drops.  LVAD equipment check completed and is in good working order.  Back-up equipment present.   LVAD education done on emergency procedures and precautions and reviewed exit site care.  Length of Stay: [address] PA-C 03/16/2023, 8:19 AM  VAD Team --- VAD ISSUES ONLY--- Pager [phone] (7am - 7am)  Advanced Heart Failure Team  Pager [phone] (M-F; 7a - 5p)  Please contact [facility] for night-coverage after hours (5p -7a ) and weekends on [email or web address]  Patient seen with PA, agree with the above note.   He has worked with PT today, no complaints.   CVP 10, co-ox 67%.  Weight trending down and good UOP.  Creatinine lower at 1.37. MAP elevated 90s-100s.   General: Well appearing this am. NAD.  HEENT: Normal. Neck: Supple, JVP 10 cm. Carotids OK.  Cardiac:  Mechanical heart sounds with LVAD hum present.  Lungs:  CTAB, normal effort.  Abdomen:  NT, ND, no HSM. No bruits or masses. +BS  LVAD exit site: Well-healed and incorporated. Dressing dry and intact. No erythema or drainage. Stabilization device present and accurately applied. Driveline dressing changed daily per sterile technique. Extremities:  Warm and dry. No cyanosis, clubbing, rash, or edema.  Neuro:  Alert & oriented x 3. Cranial nerves grossly intact. Moves all 4 extremities w/o difficulty. Affect pleasant    MAP elevated, agree with increasing losartan to 25 mg daily (can transition to [facility] in the future as well) and adding spironolactone.   Mild volume overload with CVP around 10.  With weight trending down and good UOP, will leave torsemide at 20 mg daily.    Low grade fever (99.1) with productive cough, WBCs 13K.  CXR with improvement from prior.   - Follow for now, send procalcitonin.   Marca Ancona 03/16/2023 10:46 AM

## 2023-03-16 NOTE — Progress Notes (Addendum)
ANTICOAGULATION CONSULT NOTE - Follow-up  Pharmacy Consult for warfarin management Indication:  LVAD  No Known Allergies  Patient Measurements: Height: 5\' 9"  (175.3 cm) Weight: 68.9 kg (151 lb 14.4 oz) IBW/kg (Calculated) : 70.7 Heparin Dosing Weight: 73.5 kg  Vital Signs: Temp: 98.9 F (37.2 C) (09/20 0741) Temp Source: Oral (09/20 0741) BP: 105/92 (09/20 0741)  Labs: Recent Labs    03/14/23 0521 03/15/23 0105 03/15/23 1000 03/16/23 0602  HGB 9.0* 8.4*  --  8.4*  HCT 26.2* 24.6*  --  25.8*  PLT 195 240  --  284  LABPROT 17.8* 18.1*  --  20.2*  INR 1.5* 1.5*  --  1.7*  CREATININE 1.63*  --  1.42* 1.37*    Estimated Creatinine Clearance: 50.3 mL/min (A) (by C-G formula based on SCr of 1.37 mg/dL (H)).   Medical History: Past Medical History:  Diagnosis Date   Acute MI, anterior wall (HCC)    AICD (automatic cardioverter/defibrillator) present    CAD (coronary artery disease)    2D ECHO, 07/13/2011 - EF <25%, LV moderatelty dilated, LA moderately dilatedLEXISCAN, 12/14/2011 - moderate-severe perfusion defect seen in the basal anteroseptal, mid anterior, apicacl anterior, apical, apical inferior, and apical lateral regions, post-stress EF 25%, new EKG changes from baseline abnormalities   Cancer (HCC)    Prostate   CHF (congestive heart failure) (HCC) 2012   Hypertension 08/08/2021   Inguinal hernia, left    Pneumonia    November 2023   Pre-diabetes     Medications:  Medications Prior to Admission  Medication Sig Dispense Refill Last Dose   acetaminophen (TYLENOL) 500 MG tablet Take 1,000 mg by mouth every 6 (six) hours as needed for mild pain.   Past Week   amiodarone (PACERONE) 200 MG tablet Take 2 tablets (400 mg total) by mouth 2 (two) times daily for 14 days, THEN 1 tablet (200 mg total) 2 (two) times daily for 14 days. 84 tablet 0 02/25/2023 at am   atorvastatin (LIPITOR) 80 MG tablet Take 1 tablet (80 mg total) by mouth daily. 30 tablet 3 02/25/2023 at am    Cholecalciferol (VITAMIN D) 125 MCG (5000 UT) CAPS Take 5,000 Units by mouth daily.   02/25/2023 at am   dapagliflozin propanediol (FARXIGA) 10 MG TABS tablet Take 1 tablet (10 mg total) by mouth daily. 30 tablet 3 02/25/2023 at am   metoprolol succinate (TOPROL-XL) 25 MG 24 hr tablet Take 1 tablet (25 mg total) by mouth daily. 30 tablet 2 02/25/2023 at am   Multiple Vitamin (MULTIVITAMIN) tablet Take 1 tablet by mouth daily.   02/25/2023 at am   nitroGLYCERIN (NITROSTAT) 0.4 MG SL tablet Place 1 tablet (0.4 mg total) under the tongue every 5 (five) minutes as needed. Usual dose is q 5 minutes x 3 doses. 75 tablet 0 02/26/2023   potassium chloride SA (KLOR-CON M) 20 MEQ tablet Take 1 tablet (20 mEq total) by mouth daily. 30 tablet 2 02/25/2023 at am   sacubitril-valsartan (ENTRESTO) 24-26 MG Take 1 tablet by mouth 2 (two) times daily. 60 tablet 3 02/25/2023 at am   spironolactone (ALDACTONE) 25 MG tablet Take 0.5 tablets (12.5 mg total) by mouth every other day. TAKE EOD ON EVEN DAYS 15 tablet 6 02/24/2023 at am   tamsulosin (FLOMAX) 0.4 MG CAPS capsule Take 0.4 mg by mouth in the morning and at bedtime.   02/25/2023 at am   torsemide (DEMADEX) 20 MG tablet Take 1 tablet by mouth every other day. 45  tablet 3 02/24/2023 at am   warfarin (COUMADIN) 5 MG tablet Take 1 tablet daily except 1/2 tablet on Wednesdays and Fridays or as directed. (Patient taking differently: Take 2.5-5 mg by mouth See admin instructions. Take 1 tablet by mouth every Tue and Thur, then take 1/2 tablet all other days) 90 tablet 1 02/25/2023 at 1230   ZENPEP 40000-126000 units CPEP Take 1 capsule by mouth 3 (three) times daily with meals.   02/25/2023 at pm   amiodarone (PACERONE) 200 MG tablet Take 1 tablet (200 mg) by mouth daily. 30 tablet 5    Scheduled:   amiodarone  200 mg Oral BID   aspirin EC  81 mg Oral Daily   Or   aspirin  81 mg Per Tube Daily   Or   aspirin  150 mg Rectal Daily   atorvastatin  80 mg Oral Daily   bisacodyl  10 mg Oral  Daily   Or   bisacodyl  10 mg Rectal Daily   Chlorhexidine Gluconate Cloth  6 each Topical Daily   docusate sodium  200 mg Oral Daily   feeding supplement  237 mL Oral Q24H   finasteride  5 mg Oral Daily   insulin aspart  0-24 Units Subcutaneous TID WC   isosorbide-hydrALAZINE  1 tablet Oral TID   losartan  12.5 mg Oral Daily   multivitamin with minerals  1 tablet Oral Daily   pantoprazole  40 mg Oral Daily   polyethylene glycol  17 g Oral Daily   potassium chloride  20 mEq Oral Once   sodium chloride flush  10-40 mL Intracatheter Q12H   sodium chloride flush  3 mL Intravenous Q12H   spironolactone  12.5 mg Oral Daily   tamsulosin  0.4 mg Oral QPC supper   torsemide  20 mg Oral Daily   warfarin  4 mg Oral ONCE-1600   Warfarin - Pharmacist Dosing Inpatient   Does not apply q1600   Infusions:   sodium chloride 20 mL/hr at 03/10/23 2100   sodium chloride     lactated ringers     lactated ringers Stopped (03/10/23 0729)   PRN: sodium chloride, dextrose, morphine injection, ondansetron (ZOFRAN) IV, mouth rinse, oxyCODONE, sodium chloride flush, sodium chloride flush, traMADol, traZODone  Assessment: Pt presented to [facility] ED on 02/26/2023 with shortness of breath and SpO2 of 78 on room air. Pt has PMH of ICM (LAD infarct), HFrEF (EF <20%), LVT, VT s/p ICD, HTN, CKD3, HLD  Pt underwent HMIII implantation on 9/12. Anticoagulation was held immediately post-procedure due to risk of major bleeding events. Warfarin was initiated 9/15. Pt's INR remains subtherapeutic this morning at 1.7, but is increasing. Pt's H/H remains stable. LDH stable 250-300. Pt is still having some hematuria that is improving and still being monitored. Pt is on an oral diet with continued good intake.  Goal of Therapy:  2.0-2.5 Monitor platelets by anticoagulation protocol: Yes   Plan:  Give warfarin 4 mg x1 Monitor daily INR, CBC, and s/sx of bleeding  Wilmer Floor, PharmD PGY2 Cardiology Pharmacy  Resident 03/16/2023,9:55 AM

## 2023-03-16 NOTE — Progress Notes (Signed)
8 Days Post-Op Procedure(s) (LRB): INSERTION OF IMPLANTABLE LEFT VENTRICULAR ASSIST DEVICE (N/A) TRANSESOPHAGEAL ECHOCARDIOGRAM (N/A) Subjective:  No complaints. Feels like he is making progress. Says he did not get to walk much yesterday.  Objective: Vital signs in last 24 hours: Temp:  [97.9 F (36.6 C)-99.1 F (37.3 C)] 97.9 F (36.6 C) (09/20 0552) Pulse Rate:  [80] 80 (09/19 2009) Cardiac Rhythm: Atrial paced;Normal sinus rhythm (09/19 1900) Resp:  [14-24] 20 (09/20 0552) BP: (98-149)/(76-110) 116/99 (09/20 0552) SpO2:  [96 %-99 %] 98 % (09/20 0552) Weight:  [68.9 kg] 68.9 kg (09/20 0611)  Hemodynamic parameters for last 24 hours: CVP:  [5 mmHg-8 mmHg] 6 mmHg  Intake/Output from previous day: 09/19 0701 - 09/20 0700 In: 360 [P.O.:360] Out: 1550 [Urine:1550] Intake/Output this shift: No intake/output data recorded.  General appearance: alert and cooperative Neurologic: intact Heart: regular rate and rhythm, LVAD humm Lungs: diminished breath sounds bibasilar Abdomen: soft, non-tender; bowel sounds normal Extremities: no edema Wound: incision healing well  Lab Results: Recent Labs    03/15/23 0105 03/16/23 0602  WBC 12.9* 13.9*  HGB 8.4* 8.4*  HCT 24.6* 25.8*  PLT 240 284   BMET:  Recent Labs    03/15/23 1000 03/16/23 0602  NA 133* 135  K 3.7 3.9  CL 98 100  CO2 25 25  GLUCOSE 138* 109*  BUN 28* 25*  CREATININE 1.42* 1.37*  CALCIUM 8.8* 8.6*    PT/INR:  Recent Labs    03/16/23 0602  LABPROT 20.2*  INR 1.7*   ABG    Component Value Date/Time   PHART 7.56 (H) 03/15/2023 0518   HCO3 27.1 03/15/2023 0518   TCO2 26 03/12/2023 0627   ACIDBASEDEF 2.0 03/11/2023 0525   O2SAT 67.3 03/16/2023 0603   CBG (last 3)  Recent Labs    03/15/23 1644 03/15/23 2044 03/16/23 0614  GLUCAP 103* 149* 111*    Assessment/Plan:  POD 8  Hemodynamics stable with MAP still up around 100.  LVAD parameters stable. Looks sinus with paced rhythm.  Tmax  99.1 last night. WBC 13.9, some rattling cough. Will check CXR this am.  Encouraged to ambulate and use IS.  INR up to 1.7. Coumadin per pharmacy.  Awaiting decision on CIR.   LOS: 18 days    Alleen Borne 03/16/2023

## 2023-03-16 NOTE — Progress Notes (Signed)
Hypoglycemic Event  CBG: 62  Treatment: 4 oz juice/soda  Symptoms: None  Follow-up CBG: Time:2145 CBG Result:97  Possible Reasons for Event: Inadequate meal intake  Comments/MD notified: Pt. Asymptomatic.    Jodean Lima

## 2023-03-16 NOTE — PMR Pre-admission (Signed)
PMR Admission Coordinator Pre-Admission Assessment  Patient: Steven [name] is an 67 y.o., male MRN: [id number] DOB: April 16, 1956 Height: 5\' 9"  (175.3 cm) Weight: 68.9 kg  Insurance Information HMO:     PPO:     PCP:      IPA:      80/20:      OTHER:  PRIMARY: U[employer] Medicare      Policy#: [id number]      Subscriber: pt CM Name: Steven [name]      Phone#: [phone]     Fax#: [phone] Pre-Cert#: [id number] auth for CIR from [place] with [facility]  for admit 9/24 with updates due to fax listed above on 9/30      Employer:  Benefits:  Phone #: [phone]     Name:  Eff. Date: 06/26/22     Deduct: $240 (met)      Out of Pocket Max: [phone] (met [phone])      Life Max:  CIR: $1785/admit      SNF: 20 full days Outpatient: 80%     Co-Pay: 20% Home Health: 100%      Co-Pay:  DME: 80%     Co-Pay: 20% Providers:  SECONDARY: Medicaid of [place]       Policy#: [id number]      Phone#:   Financial Counselor:       Phone#:   The "Data Collection Information Summary" for patients in Inpatient Rehabilitation Facilities with attached "Privacy Act Statement-Health Care Records" was provided and verbally reviewed with: Patient and Family  Emergency Contact Information Contact Information     Name Relation Home Work Mobile   Steven [name] Daughter [phone]  [phone]      Other Contacts     Name Relation Home Work Mobile   Steven [name] Other   [phone]       Current Medical History  Patient Admitting Diagnosis: new LVAD  History of Present Illness: Steven [name] with history of ischemic cardiomyopathy/STEMI with ejection fraction of 20 to 25%, prediabetes, systolic congestive heart failure, 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  Recent admission 4/21 to 10/18/2022 for heart  failure exacerbation.  Presented 02/26/2023 with dyspnea.  Chest x-ray showed severe diffuse bilateral airspace disease, worse on the right and improving on the left.  Admission chemistries unremarkable except BNP 1060, potassium 8.3.  Echocardiogram with ejection fraction less than 20%.  Left ventricle demonstrating severe decreased function.  Cardiology follow-up for acute on chronic combined systolic and diastolic congestive heart failure.  Underwent cardiac catheterization showing normal RA pressure, mild pulmonary hypertension mildly elevated wedge pressure and moderately reduced cardiac output.  CT of the chest showed extensive calcification of his pericardium particularly over the right atrium and atrioventricular groove part of the right ventricle.  His pericardial space likely obliterated due to remote pericarditis.  He was started on milrinone as well as diuresis.  Evaluated by cardiothoracic surgery for LVAD completed 03/08/2023 per Dr. Maren Beach.  He remained intubated through 03/09/2023.  Hospital course is chronic Coumadin has been resumed followed by heart failure team.  He did go into A-fib with RVR 9/14 IV amiodarone started and slowly weaned transition to p.o currently 200 mg twice daily.  Acute blood loss anemia 9.2 and monitored.  Urology was consulted 9/17 for hematuria he was placed on Proscar and hematuria has resolved his urinalysis is unremarkable persistent  leukocytosis maintained on empiric Rocephin..  Creatinine remained stable 1.74-1.17.  Therapies initiated with sternal precautions.  Therapy evaluations completed and pt was recommended a comprehensive rehab program.     Patient's medical record from [facility] has been reviewed by the rehabilitation admission coordinator and physician.  Past Medical History  Past Medical History:  Diagnosis Date   Acute MI, anterior wall (HCC)    AICD (automatic cardioverter/defibrillator) present    CAD (coronary artery disease)    2D ECHO,  07/13/2011 - EF <25%, LV moderatelty dilated, LA moderately dilatedLEXISCAN, 12/14/2011 - moderate-severe perfusion defect seen in the basal anteroseptal, mid anterior, apicacl anterior, apical, apical inferior, and apical lateral regions, post-stress EF 25%, new EKG changes from baseline abnormalities   Cancer [facility])    Prostate   CHF (congestive heart failure) (HCC) 2012   Hypertension 08/08/2021   Inguinal hernia, left    Pneumonia    November 2023   Pre-diabetes     Has the patient had major surgery during 100 days prior to admission? Yes  Family History   family history is not on file.  Current Medications  Current Facility-Administered Medications:    0.45 % sodium chloride infusion, , Intravenous, Continuous PRN, Clegg, Amy D, NP, Last Rate: 20 mL/hr at 03/10/23 2100, Infusion Verify at 03/10/23 2100   0.9 %  sodium chloride infusion, 250 mL, Intravenous, Continuous, Clegg, Amy D, NP   amiodarone (PACERONE) tablet 200 mg, 200 mg, Oral, BID, Clegg, Amy D, NP, 200 mg at 03/16/23 0955   aspirin EC tablet 81 mg, 81 mg, Oral, Daily, 81 mg at 03/16/23 0955 **OR** aspirin chewable tablet 81 mg, 81 mg, Per Tube, Daily **OR** aspirin suppository 150 mg, 150 mg, Rectal, Daily, Clegg, Amy D, NP   atorvastatin (LIPITOR) tablet 80 mg, 80 mg, Oral, Daily, Clegg, Amy D, NP, 80 mg at 03/16/23 0954   bisacodyl (DULCOLAX) EC tablet 10 mg, 10 mg, Oral, Daily, 10 mg at 03/16/23 0955 **OR** bisacodyl (DULCOLAX) suppository 10 mg, 10 mg, Rectal, Daily, Clegg, Amy D, NP   Chlorhexidine Gluconate Cloth 2 % PADS 6 each, 6 each, Topical, Daily, Clegg, Amy D, NP, 6 each at 03/16/23 1004   dextrose 50 % solution 0-50 mL, 0-50 mL, Intravenous, PRN, Clegg, Amy D, NP   docusate sodium (COLACE) capsule 200 mg, 200 mg, Oral, Daily, Clegg, Amy D, NP, 200 mg at 03/16/23 0954   feeding supplement (ENSURE ENLIVE / ENSURE PLUS) liquid 237 mL, 237 mL, Oral, Q24H, Laurey Morale, MD, 237 mL at 03/16/23 1432   finasteride  (PROSCAR) tablet 5 mg, 5 mg, Oral, Daily, Clegg, Amy D, NP, 5 mg at 03/16/23 0954   insulin aspart (novoLOG) injection 0-24 Units, 0-24 Units, Subcutaneous, TID WC, Clegg, Amy D, NP, 2 Units at 03/16/23 1147   isosorbide-hydrALAZINE (BIDIL) 20-37.5 MG per tablet 1 tablet, 1 tablet, Oral, TID, Clegg, Amy D, NP, 1 tablet at 03/16/23 0955   lactated ringers infusion, , Intravenous, Continuous, Clegg, Amy D, NP   lactated ringers infusion, , Intravenous, Continuous, Clegg, Amy D, NP, Stopped at 03/10/23 0729   [START ON 03/17/2023] losartan (COZAAR) tablet 25 mg, 25 mg, Oral, Daily, Laurey Morale, MD   morphine (PF) 2 MG/ML injection 1-4 mg, 1-4 mg, Intravenous, Q1H PRN, Clegg, Amy D, NP, 2 mg at 03/11/23 1046   multivitamin with minerals tablet 1 tablet, 1 tablet, Oral, Daily, Clegg, Amy D, NP, 1 tablet at 03/16/23 0955   ondansetron (ZOFRAN) injection 4  mg, 4 mg, Intravenous, Q6H PRN, Clegg, Amy D, NP   Oral care mouth rinse, 15 mL, Mouth Rinse, PRN, Clegg, Amy D, NP   oxyCODONE (Oxy IR/ROXICODONE) immediate release tablet 5-10 mg, 5-10 mg, Oral, Q3H PRN, Clegg, Amy D, NP, 5 mg at 03/11/23 0258   pantoprazole (PROTONIX) EC tablet 40 mg, 40 mg, Oral, Daily, Clegg, Amy D, NP, 40 mg at 03/16/23 0955   polyethylene glycol (MIRALAX / GLYCOLAX) packet 17 g, 17 g, Oral, Daily, Clegg, Amy D, NP, 17 g at 03/16/23 0955   sodium chloride flush (NS) 0.9 % injection 10-40 mL, 10-40 mL, Intracatheter, Q12H, Clegg, Amy D, NP, 10 mL at 03/16/23 1004   sodium chloride flush (NS) 0.9 % injection 10-40 mL, 10-40 mL, Intracatheter, PRN, Clegg, Amy D, NP   sodium chloride flush (NS) 0.9 % injection 3 mL, 3 mL, Intravenous, Q12H, Clegg, Amy D, NP, 3 mL at 03/16/23 1004   sodium chloride flush (NS) 0.9 % injection 3 mL, 3 mL, Intravenous, PRN, Clegg, Amy D, NP   spironolactone (ALDACTONE) tablet 12.5 mg, 12.5 mg, Oral, Daily, Simmons, Brittainy M, PA-C, 12.5 mg at 03/16/23 0955   tamsulosin (FLOMAX) capsule 0.4 mg, 0.4  mg, Oral, QPC supper, Clegg, Amy D, NP, 0.4 mg at 03/15/23 1708   torsemide (DEMADEX) tablet 20 mg, 20 mg, Oral, Daily, Clegg, Amy D, NP, 20 mg at 03/16/23 0955   traMADol (ULTRAM) tablet 50-100 mg, 50-100 mg, Oral, Q4H PRN, Clegg, Amy D, NP, 50 mg at 03/11/23 0035   traZODone (DESYREL) tablet 50 mg, 50 mg, Oral, QHS PRN, Clegg, Amy D, NP, 50 mg at 03/15/23 2033   warfarin (COUMADIN) tablet 4 mg, 4 mg, Oral, ONCE-1600, Allayne Butcher, PA-C   Warfarin - Pharmacist Dosing Inpatient, , Does not apply, q1600, Laurey Morale, MD, Given at 03/15/23 1704  Patients Current Diet:  Diet Order             Diet regular Room service appropriate? Yes; Fluid consistency: Thin  Diet effective now                   Precautions / Restrictions Precautions Precautions: Sternal, Other (comment) (LVAD) Precaution Comments: blood in urine Restrictions Weight Bearing Restrictions: Yes (sternal precautions) RUE Weight Bearing: Non weight bearing LUE Weight Bearing: Non weight bearing Other Position/Activity Restrictions: sternal prec   Has the patient had 2 or more falls or a fall with injury in the past year? No  Prior Activity Level Limited Community (1-2x/wk): indep prior to admit, no DME, driving  Prior Functional Level Self Care: Did the patient need help bathing, dressing, using the toilet or eating? Independent  Indoor Mobility: Did the patient need assistance with walking from room to room (with or without device)? Independent  Stairs: Did the patient need assistance with internal or external stairs (with or without device)? Independent  Functional Cognition: Did the patient need help planning regular tasks such as shopping or remembering to take medications? Independent  Patient Information Are you of Hispanic, Latino/a,or Spanish origin?: A. No, not of Hispanic, Latino/a, or Spanish origin What is your race?: B. Black or African American Do you need or want an interpreter to  communicate with a doctor or health care staff?: 0. No  Patient's Response To:  Health Literacy and Transportation Is the patient able to respond to health literacy and transportation needs?: Yes Health Literacy - How often do you need to have someone help you when you read  instructions, pamphlets, or other written material from your doctor or pharmacy?: Never In the past 12 months, has lack of transportation kept you from medical appointments or from getting medications?: No In the past 12 months, has lack of transportation kept you from meetings, work, or from getting things needed for daily living?: No  Journalist, newspaper / Equipment Home Assistive Devices/Equipment: None Home Equipment: None  Prior Device Use: Indicate devices/aids used by the patient prior to current illness, exacerbation or injury? None of the above  Current Functional Level Cognition  Overall Cognitive Status: Within Functional Limits for tasks assessed Orientation Level: Oriented X4 General Comments: difficulty problem solving switching LVAD from wall unit to batteries    Extremity Assessment (includes Sensation/Coordination)  Upper Extremity Assessment: Generalized weakness (within limits of sternal precations)  Lower Extremity Assessment: Defer to PT evaluation    ADLs  Overall ADL's : Needs assistance/impaired Eating/Feeding: Set up, Sitting Eating/Feeding Details (indicate cue type and reason): at EOB Grooming: Wash/dry hands, Wash/dry face, Oral care, Set up, Sitting Grooming Details (indicate cue type and reason): at EOB Upper Body Bathing: Moderate assistance Lower Body Bathing: Maximal assistance Upper Body Dressing : Minimal assistance, Sitting, Cueing for compensatory techniques Upper Body Dressing Details (indicate cue type and reason): at EOB Lower Body Dressing: Minimal assistance, Cueing for compensatory techniques, Sitting/lateral leans Lower Body Dressing Details (indicate cue type and  reason): at EOB Toilet Transfer: Minimal assistance, BSC/3in1, Rolling walker (2 wheels), Cueing for sequencing (step-pivot) Toilet Transfer Details (indicate cue type and reason): simulated to chair Toileting- Clothing Manipulation and Hygiene: Maximal assistance Functional mobility during ADLs: Minimal assistance General ADL Comments: Pt requiring mod cues to follow sternal precautions and for compensatory techniques during dressing tasks and sit/stand transfers.    Mobility  Overal bed mobility: Needs Assistance Bed Mobility: Sit to Supine Sidelying to sit: Mod assist Supine to sit: Min assist Sit to supine: Min assist General bed mobility comments: minA for trunk elevation cues for technique    Transfers  Overall transfer level: Needs assistance Equipment used: Rolling walker (2 wheels) Transfers: Sit to/from Stand, Bed to chair/wheelchair/BSC Sit to Stand: Min assist Bed to/from chair/wheelchair/BSC transfer type:: Step pivot Step pivot transfers: Min assist General transfer comment: use of heart pillow, minA to initiated rocking momentum and anterior weight shift to power up through legs.  Pt was able to translate forward  better with each standing trial.    Ambulation / Gait / Stairs / Wheelchair Mobility  Ambulation/Gait Ambulation/Gait assistance: Editor, commissioning (Feet): 300 Feet Assistive device: Rolling walker (2 wheels) Gait Pattern/deviations: Step-through pattern, Decreased stride length, Trunk flexed General Gait Details: minA for walker management. decreased cadence, verbal cues to look forward, verbal cues to stay in walker during turns  Sats maintained at 97% overall. Gait velocity: dec Gait velocity interpretation: <1.31 ft/sec, indicative of household ambulator    Posture / Balance Dynamic Sitting Balance Sitting balance - Comments: no external support seated EOB Balance Overall balance assessment: Needs assistance Sitting-balance support: No upper  extremity supported, Feet supported Sitting balance-Leahy Scale: Good Sitting balance - Comments: no external support seated EOB Standing balance support: During functional activity, Reliant on assistive device for balance Standing balance-Leahy Scale: Poor Standing balance comment: reliant on external support    Special needs/care consideration LVAD   Previous Home Environment (from acute therapy documentation) Living Arrangements: Alone Available Help at Discharge: Other (Comment) Type of Home: House Home Layout: Two level, Bed/bath upstairs Alternate Level Stairs-Rails: Right  Alternate Level Stairs-Number of Steps: flight Home Access: Level entry Bathroom Shower/Tub: Engineer, manufacturing systems: Standard Home Care Services: No  Discharge Living Setting Plans for Discharge Living Setting: Patient's home (ex-spouse Suzette Battiest) to stay with him) Type of Home at Discharge: House Discharge Home Layout: Bed/bath upstairs Discharge Home Access: Level entry Discharge Bathroom Shower/Tub: Tub/shower unit Discharge Bathroom Toilet: Standard Discharge Bathroom Accessibility: Yes How Accessible: Accessible via walker Does the patient have any problems obtaining your medications?: No  Social/Family/Support Systems Anticipated Caregiver: ex-wife, Suzette Battiest Anticipated Industrial/product designer Information: [phone] (have to leave a message with your phone numer or text her your number to get in touch with her) Ability/Limitations of Caregiver: none stated Caregiver Availability: 24/7 Discharge Plan Discussed with Primary Caregiver: Yes Is Caregiver In Agreement with Plan?: Yes Does Caregiver/Family have Issues with Lodging/Transportation while Pt is in Rehab?: No  Goals Patient/Family Goal for Rehab: PT/OT mod I, SLP n/a Expected length of stay: 7-10 days Additional Information: new LVAD, discharge home to pt's house with ex-wife Suzette Battiest) providing supervision at  discharge Pt/Family Agrees to Admission and willing to participate: Yes Program Orientation Provided & Reviewed with Pt/Caregiver Including Roles  & Responsibilities: Yes  Barriers to Discharge: Insurance for SNF coverage  Decrease burden of Care through IP rehab admission: n/a   Possible need for SNF placement upon discharge: Not anticipated.  Plan for discharge to patient's home at mod I level, ex wife Suzette Battiest) to provide supervision initially for LVAD   Patient Condition: I have reviewed medical records from [facility], spoken with CM, and patient and family member. I met with patient at the bedside and discussed via phone for inpatient rehabilitation assessment.  Patient will benefit from ongoing PT and OT, can actively participate in 3 hours of therapy a day 5 days of the week, and can make measurable gains during the admission.  Patient will also benefit from the coordinated team approach during an Inpatient Acute Rehabilitation admission.  The patient will receive intensive therapy as well as Rehabilitation physician, nursing, social worker, and care management interventions.  Due to safety, skin/wound care, disease management, medication administration, pain management, and patient education the patient requires 24 hour a day rehabilitation nursing.  The patient is currently min with mobility and basic ADLs.  Discharge setting and therapy post discharge at home with home health is anticipated.  Patient has agreed to participate in the Acute Inpatient Rehabilitation Program and will admit today.  Preadmission Screen Completed By:  Stephania Fragmin, PT, DPT 03/16/2023 4:25 PM ______________________________________________________________________   Discussed status with Dr. Riley Kill on 03/20/23  at 9:57 AM  and received approval for admission today.  Admission Coordinator:  Stephania Fragmin, PT, DPT time 9:57 AM Dorna Bloom 03/20/23    Assessment/Plan: Diagnosis: debility related to heart  failure/LVAD placement Does the need for close, 24 hr/day Medical supervision in concert with the patient's rehab needs make it unreasonable for this patient to be served in a less intensive setting? Yes Co-Morbidities requiring supervision/potential complications: ICM, CKD iii, prostate cancer Due to bladder management, bowel management, safety, skin/wound care, disease management, medication administration, pain management, and patient education, does the patient require 24 hr/day rehab nursing? Yes Does the patient require coordinated care of a physician, rehab nurse, PT, OT to address physical and functional deficits in the context of the above medical diagnosis(es)? Yes Addressing deficits in the following areas: balance, endurance, locomotion, strength, transferring, bowel/bladder control, bathing, dressing, feeding, grooming, toileting, and psychosocial support Can the patient  actively participate in an intensive therapy program of at least 3 hrs of therapy 5 days a week? Yes The potential for patient to make measurable gains while on inpatient rehab is excellent Anticipated functional outcomes upon discharge from inpatient rehab: modified independent PT, modified independent OT, n/a SLP Estimated rehab length of stay to reach the above functional goals is: 7-10 days Anticipated discharge destination: Home 10. Overall Rehab/Functional Prognosis: excellent   MD Signature: [name], MD, [facility] Physical Medicine & Rehabilitation Medical Director Rehabilitation Services 03/20/2023

## 2023-03-17 DIAGNOSIS — I5023 Acute on chronic systolic (congestive) heart failure: Secondary | ICD-10-CM | POA: Diagnosis not present

## 2023-03-17 LAB — CBC
HCT: 27.7 % — ABNORMAL LOW (ref 39.0–52.0)
Hemoglobin: 8.8 g/dL — ABNORMAL LOW (ref 13.0–17.0)
MCH: 30 pg (ref 26.0–34.0)
MCHC: 31.8 g/dL (ref 30.0–36.0)
MCV: 94.5 fL (ref 80.0–100.0)
Platelets: 346 10*3/uL (ref 150–400)
RBC: 2.93 MIL/uL — ABNORMAL LOW (ref 4.22–5.81)
RDW: 15.8 % — ABNORMAL HIGH (ref 11.5–15.5)
WBC: 16.8 10*3/uL — ABNORMAL HIGH (ref 4.0–10.5)
nRBC: 0.2 % (ref 0.0–0.2)

## 2023-03-17 LAB — COMPREHENSIVE METABOLIC PANEL
ALT: 103 U/L — ABNORMAL HIGH (ref 0–44)
AST: 139 U/L — ABNORMAL HIGH (ref 15–41)
Albumin: 2.7 g/dL — ABNORMAL LOW (ref 3.5–5.0)
Alkaline Phosphatase: 70 U/L (ref 38–126)
Anion gap: 9 (ref 5–15)
BUN: 25 mg/dL — ABNORMAL HIGH (ref 8–23)
CO2: 25 mmol/L (ref 22–32)
Calcium: 8.6 mg/dL — ABNORMAL LOW (ref 8.9–10.3)
Chloride: 98 mmol/L (ref 98–111)
Creatinine, Ser: 1.44 mg/dL — ABNORMAL HIGH (ref 0.61–1.24)
GFR, Estimated: 53 mL/min — ABNORMAL LOW (ref >60)
Glucose, Bld: 103 mg/dL — ABNORMAL HIGH (ref 70–99)
Potassium: 4 mmol/L (ref 3.5–5.1)
Sodium: 132 mmol/L — ABNORMAL LOW (ref 135–145)
Total Bilirubin: 1.4 mg/dL — ABNORMAL HIGH (ref 0.3–1.2)
Total Protein: 6.3 g/dL — ABNORMAL LOW (ref 6.5–8.1)

## 2023-03-17 LAB — COOXEMETRY PANEL
Carboxyhemoglobin: 2.7 % — ABNORMAL HIGH (ref 0.5–1.5)
Methemoglobin: 0.8 % (ref 0.0–1.5)
O2 Saturation: 66.9 %
Total hemoglobin: 9.5 g/dL — ABNORMAL LOW (ref 12.0–16.0)

## 2023-03-17 LAB — PROTIME-INR
INR: 2.1 — ABNORMAL HIGH (ref 0.8–1.2)
Prothrombin Time: 23.6 seconds — ABNORMAL HIGH (ref 11.4–15.2)

## 2023-03-17 LAB — GLUCOSE, CAPILLARY
Glucose-Capillary: 102 mg/dL — ABNORMAL HIGH (ref 70–99)
Glucose-Capillary: 122 mg/dL — ABNORMAL HIGH (ref 70–99)
Glucose-Capillary: 104 mg/dL — ABNORMAL HIGH (ref 70–99)
Glucose-Capillary: 110 mg/dL — ABNORMAL HIGH (ref 70–99)

## 2023-03-17 LAB — LACTATE DEHYDROGENASE: LDH: 303 U/L — ABNORMAL HIGH (ref 98–192)

## 2023-03-17 MED ORDER — WARFARIN SODIUM 2.5 MG PO TABS
2.5000 mg | ORAL_TABLET | Freq: Once | ORAL | Status: AC
  Administered 2023-03-17: 2.5 mg via ORAL
  Filled 2023-03-17: qty 1

## 2023-03-17 MED ORDER — SACUBITRIL-VALSARTAN 24-26 MG PO TABS
1.0000 | ORAL_TABLET | Freq: Two times a day (BID) | ORAL | Status: DC
  Administered 2023-03-17 – 2023-03-19 (×5): 1 via ORAL
  Filled 2023-03-17 (×5): qty 1

## 2023-03-17 NOTE — Progress Notes (Signed)
Patient ID: Steven [name], male   DOB: 03/18/1956, 67 y.o.   MRN: [id number]   Advanced Heart Failure VAD Team Note  PCP-Cardiologist: Dr. Gala Romney   Subjective:    9/12: HM3 LVAD Implant. Intra-op TEE LVEF 15%, RV normal  9/13: Extubated.  Driveline pulled back, 1 inch of Velour exposed.  9/14: Went into atrial fibrillation with RVR,  IV amiodarone started.  9/15: Hematuria.  9/16: SR, amio cut back to 30 mg per hour 9/17: Urology consulted for hematuria. Proscar added. Given 1 unit of blood. Ramp Echo speed increased to 5500.   Sitting up in chair. Co-ox 67%  SCr stable 1.4 LDH 303 CVP 4 (measured personally)  Had low-grade temp yesterday with cough. PCT <0.1  CXR 9/20 clearing airspace disease  Still with cough. Feels weak. Denies SOB.   LVAD INTERROGATION:  HeartMate III LVAD:   Flow 4.4  liters/min, speed 5500, power 4.4 PI 3.3. VAD interrogated personally. Parameters stable.  Objective:    Vital Signs:   Temp:  [98 F (36.7 C)-98.4 F (36.9 C)] 98 F (36.7 C) (09/21 1105) Pulse Rate:  [80] 80 (09/21 1105) Resp:  [14-20] 20 (09/21 1105) BP: (84-92)/(55-70) 88/70 (09/21 1105) SpO2:  [96 %-100 %] 98 % (09/21 1105) Weight:  [68.2 kg] 68.2 kg (09/21 0657) Last BM Date : 03/14/23 Mean arterial Pressure 75 Intake/Output:   Intake/Output Summary (Last 24 hours) at 03/17/2023 1155 Last data filed at 03/17/2023 9528 Gross per 24 hour  Intake 580 ml  Output 1400 ml  Net -820 ml     Physical Exam   General:  NAD.  HEENT: normal  Neck: supple. JVP not elevated.  Carotids 2+ bilat; no bruits. No lymphadenopathy or thryomegaly appreciated. Cor: LVAD hum.  Lungs: Clear. Abdomen: soft, nontender, non-distended. No hepatosplenomegaly. No bruits or masses. Good bowel sounds. Driveline site clean. Anchor in place.  Extremities: no cyanosis, clubbing, rash. Warm no edema  Neuro: alert & oriented x 3. No focal deficits. Moves all 4 without problem    Telemetry  A pacing  80  EKG    No new EKG to review   Labs   Basic Metabolic Panel: Recent Labs  Lab 03/11/23 0304 03/11/23 0525 03/12/23 0330 03/12/23 4132 03/13/23 0433 03/13/23 1731 03/14/23 0521 03/15/23 0105 03/15/23 1000 03/16/23 0602 03/17/23 0510  NA 131*   < > 131*   < > 130* 131* 136  --  133* 135 132*  K 4.0   < > 3.9   < > 3.2* 3.7 3.9  --  3.7 3.9 4.0  CL 100   < > 95*  --  90* 93* 96*  --  98 100 98  CO2 21*   < > 23  --  27 24 25   --  25 25 25   GLUCOSE 179*   < > 130*  --  225* 229* 128*  --  138* 109* 103*  BUN 26*   < > 34*  --  41* 43* 38*  --  28* 25* 25*  CREATININE 1.58*   < > 1.72*  --  1.76* 1.74* 1.63*  --  1.42* 1.37* 1.44*  CALCIUM 8.5*   < > 8.7*  --  8.3* 7.9* 8.8*  --  8.8* 8.6* 8.6*  MG 2.2  --  2.1  --  2.0  --  2.2 2.3  --   --   --   PHOS 2.2*  --  3.5  --  4.0  --  3.1 3.2  --   --   --    < > = values in this interval not displayed.    Liver Function Tests: Recent Labs  Lab 03/11/23 0304 03/12/23 0330 03/13/23 0433 03/14/23 0521 03/17/23 0510  AST 66* 53* 51* 46* 139*  ALT 21 20 25 29  103*  ALKPHOS 53 69 61 68 70  BILITOT 3.8* 2.3* 1.8* 1.9* 1.4*  PROT 6.3* 6.4* 5.7* 6.5 6.3*  ALBUMIN 3.2* 3.0* 2.6* 2.8* 2.7*   No results for input(s): "LIPASE", "AMYLASE" in the last 168 hours. No results for input(s): "AMMONIA" in the last 168 hours.  CBC: Recent Labs  Lab 03/11/23 0304 03/11/23 0525 03/12/23 0330 03/12/23 0627 03/13/23 0433 03/13/23 1731 03/14/23 0521 03/15/23 0105 03/16/23 0602 03/16/23 1500 03/17/23 0510  WBC 18.5*   < > 15.4*  --  11.5*  --  12.8* 12.9* 13.9* 16.4* 16.8*  NEUTROABS 16.2*  --  12.7*  --  9.2*  --  9.6* 9.7*  --   --   --   HGB 8.5*   < > 8.5*   < > 7.0*   < > 9.0* 8.4* 8.4* 9.3* 8.8*  HCT 25.0*   < > 24.1*   < > 21.1*   < > 26.2* 24.6* 25.8* 28.9* 27.7*  MCV 90.6   < > 90.9  --  90.9  --  91.3 93.2 95.9 96.0 94.5  PLT 125*   < > 151  --  152  --  195 240 284 355 346   < > = values in this interval not  displayed.    INR: Recent Labs  Lab 03/13/23 0433 03/14/23 0521 03/15/23 0105 03/16/23 0602 03/17/23 0510  INR 1.6* 1.5* 1.5* 1.7* 2.1*    Other results: EKG:    Imaging   DG CHEST PORT 1 VIEW  Result Date: 03/16/2023 CLINICAL DATA:  LVAD present.  Chest pain with cough. EXAM: PORTABLE CHEST 1 VIEW COMPARISON:  Radiograph 03/11/2023 FINDINGS: LVAD in place. Left-sided pacemaker in place. Right-sided central line is stable in positioning. Stable cardiomegaly post CABG. Coarse pericardial calcifications again seen. Improvement in bibasilar airspace disease and pleural effusions. No pneumothorax. IMPRESSION: 1. Improvement in bibasilar airspace disease and pleural effusions. 2. Stable cardiomegaly post CABG. LVAD in place. Electronically Signed   By: Narda Rutherford M.D.   On: 03/16/2023 09:47     Medications:     Scheduled Medications:  amiodarone  200 mg Oral BID   aspirin EC  81 mg Oral Daily   Or   aspirin  81 mg Per Tube Daily   Or   aspirin  150 mg Rectal Daily   atorvastatin  80 mg Oral Daily   bisacodyl  10 mg Oral Daily   Or   bisacodyl  10 mg Rectal Daily   Chlorhexidine Gluconate Cloth  6 each Topical Daily   docusate sodium  200 mg Oral Daily   feeding supplement  237 mL Oral Q24H   finasteride  5 mg Oral Daily   insulin aspart  0-24 Units Subcutaneous TID WC   isosorbide-hydrALAZINE  0.5 tablet Oral TID   losartan  12.5 mg Oral Daily   multivitamin with minerals  1 tablet Oral Daily   pantoprazole  40 mg Oral Daily   polyethylene glycol  17 g Oral Daily   sodium chloride flush  10-40 mL Intracatheter Q12H   sodium chloride flush  3 mL Intravenous Q12H   spironolactone  12.5 mg Oral  Daily   tamsulosin  0.4 mg Oral QPC supper   torsemide  20 mg Oral Daily   warfarin  2.5 mg Oral ONCE-1600   Warfarin - Pharmacist Dosing Inpatient   Does not apply q1600    Infusions:  sodium chloride 20 mL/hr at 03/10/23 2100   sodium chloride     cefTRIAXone  (ROCEPHIN)  IV 2 g (03/16/23 1849)   lactated ringers     lactated ringers Stopped (03/10/23 0729)    PRN Medications: sodium chloride, dextrose, guaiFENesin-dextromethorphan, morphine injection, ondansetron (ZOFRAN) IV, mouth rinse, oxyCODONE, sodium chloride flush, sodium chloride flush, traMADol, traZODone   Patient Profile   Anis P Saintjean is a 67 y.o. 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    Admitted with acute on chronic systolic and diastolic CHF with low-output. Advanced Heart Failure consulted and workup started for advanced therapies. S/p HM3 LVAD 9/12.   Assessment/Plan:    1. Acute on chronic combined systolic and diastolic heart failure>>Low Output S/p HM3 LVAD on 9/12.  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 NYHA IV on admission.  Echo 02/27/23: EF <20%, LV with GHK, RV mildly reduced, GIIDD, LA mod dilated, mild MR.  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 GDMT limited by renal function and hypotension.  S/P HM3 LVAD implant 9/12.  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 Intra-op TEE LVEF 15%, RV normal.  - Ramp ECHO 03/13/23. Speed increased to 5500. Pain controlled with tylenol.  - CO-OX 67% MAPS 70-80s.  - CVP 4. Stop torsemide. Use prn  - Continue Spiro 12.5 mg daily  - With improving renal function will stop losartan and Bidil  - LDH stable.  - INR 2.1 Discussed dosing with PharmD personally. - Continue 81 mg ASA 2. 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  -  no s/s angina - on ASA/statin  3. AKI on CKD stage 3: Creatinine baseline 1.6-1.8 - SCr stable 1.3-1.4 - follow w/ diuretics + GDMT titration  4. Mural thrombus: Warfarin.  5. H/o VT s/p Medtronic ICD: likely scar mediated - Continue amiodarone  6. Anemia: Post-op.  9/17  1UPRBCs.  Hgb 7>8>9>8.4>>8.4    - Still with some hematuria. Will follow  7. Atrial fibrillation:  - A Paced.  - continue amio 200 mg twice a day   - On coumadin . INR 2.1 8. Hematuria:  -Urology consulted. Started on proscar.  - Suspect related to trauma with foley insertion.  - continue to monitor  - still with blood tinged urine in [place]. If persists will need eventual scope.  - Send urine cytology.  9. ID: mTemp overnight 99.1 on 9/19. CXR and PCT ok - now AF. Continue pulmonary toilet  10. Deconditioning: cont PT/OT. - CIR following. Should be ready early this week    I reviewed the LVAD parameters from today, and compared the results to the patient's prior recorded data.  No programming changes were made.  The LVAD is functioning within specified parameters.  The patient performs LVAD self-test daily.  LVAD interrogation was negative for any significant power changes, alarms or PI events/speed drops.  LVAD equipment check completed and is in good working order.  Back-up equipment present.   LVAD education done on emergency procedures and precautions and reviewed exit  site care.  Length of Stay: 37  Arvilla Meres, MD 03/17/2023, 11:55 AM  VAD Team --- VAD ISSUES ONLY--- Pager [phone] (7am - 7am)  Advanced Heart Failure Team  Pager [phone] (M-F; 7a - 5p)  Please contact [facility] for night-coverage after hours (5p -7a ) and weekends on [email or web address]

## 2023-03-17 NOTE — Progress Notes (Signed)
ANTICOAGULATION CONSULT NOTE - Follow-up  Pharmacy Consult for warfarin management Indication:  LVAD  No Known Allergies  Patient Measurements: Height: 5\' 9"  (175.3 cm) Weight: 68.2 kg (150 lb 4.8 oz) IBW/kg (Calculated) : 70.7 Heparin Dosing Weight: 73.5 kg  Vital Signs: Temp: 98 F (36.7 C) (09/21 1105) Temp Source: Oral (09/21 1105) BP: 88/70 (09/21 1105) Pulse Rate: 80 (09/21 1105)  Labs: Recent Labs    03/15/23 0105 03/15/23 1000 03/16/23 0602 03/16/23 1500 03/17/23 0510  HGB 8.4*  --  8.4* 9.3* 8.8*  HCT 24.6*  --  25.8* 28.9* 27.7*  PLT 240  --  284 355 346  LABPROT 18.1*  --  20.2*  --  23.6*  INR 1.5*  --  1.7*  --  2.1*  CREATININE  --  1.42* 1.37*  --  1.44*    Estimated Creatinine Clearance: 47.4 mL/min (A) (by C-G formula based on SCr of 1.44 mg/dL (H)).   Medical History: Past Medical History:  Diagnosis Date   Acute MI, anterior wall (HCC)    AICD (automatic cardioverter/defibrillator) present    CAD (coronary artery disease)    2D ECHO, 07/13/2011 - EF <25%, LV moderatelty dilated, LA moderately dilatedLEXISCAN, 12/14/2011 - moderate-severe perfusion defect seen in the basal anteroseptal, mid anterior, apicacl anterior, apical, apical inferior, and apical lateral regions, post-stress EF 25%, new EKG changes from baseline abnormalities   Cancer (HCC)    Prostate   CHF (congestive heart failure) (HCC) 2012   Hypertension 08/08/2021   Inguinal hernia, left    Pneumonia    November 2023   Pre-diabetes     Medications:  Medications Prior to Admission  Medication Sig Dispense Refill Last Dose   acetaminophen (TYLENOL) 500 MG tablet Take 1,000 mg by mouth every 6 (six) hours as needed for mild pain.   Past Week   amiodarone (PACERONE) 200 MG tablet Take 2 tablets (400 mg total) by mouth 2 (two) times daily for 14 days, THEN 1 tablet (200 mg total) 2 (two) times daily for 14 days. 84 tablet 0 02/25/2023 at am   atorvastatin (LIPITOR) 80 MG tablet Take  1 tablet (80 mg total) by mouth daily. 30 tablet 3 02/25/2023 at am   Cholecalciferol (VITAMIN D) 125 MCG (5000 UT) CAPS Take 5,000 Units by mouth daily.   02/25/2023 at am   dapagliflozin propanediol (FARXIGA) 10 MG TABS tablet Take 1 tablet (10 mg total) by mouth daily. 30 tablet 3 02/25/2023 at am   metoprolol succinate (TOPROL-XL) 25 MG 24 hr tablet Take 1 tablet (25 mg total) by mouth daily. 30 tablet 2 02/25/2023 at am   Multiple Vitamin (MULTIVITAMIN) tablet Take 1 tablet by mouth daily.   02/25/2023 at am   nitroGLYCERIN (NITROSTAT) 0.4 MG SL tablet Place 1 tablet (0.4 mg total) under the tongue every 5 (five) minutes as needed. Usual dose is q 5 minutes x 3 doses. 75 tablet 0 02/26/2023   potassium chloride SA (KLOR-CON M) 20 MEQ tablet Take 1 tablet (20 mEq total) by mouth daily. 30 tablet 2 02/25/2023 at am   sacubitril-valsartan (ENTRESTO) 24-26 MG Take 1 tablet by mouth 2 (two) times daily. 60 tablet 3 02/25/2023 at am   spironolactone (ALDACTONE) 25 MG tablet Take 0.5 tablets (12.5 mg total) by mouth every other day. TAKE EOD ON EVEN DAYS 15 tablet 6 02/24/2023 at am   tamsulosin (FLOMAX) 0.4 MG CAPS capsule Take 0.4 mg by mouth in the morning and at bedtime.  02/25/2023 at am   torsemide (DEMADEX) 20 MG tablet Take 1 tablet by mouth every other day. 45 tablet 3 02/24/2023 at am   warfarin (COUMADIN) 5 MG tablet Take 1 tablet daily except 1/2 tablet on Wednesdays and Fridays or as directed. (Patient taking differently: Take 2.5-5 mg by mouth See admin instructions. Take 1 tablet by mouth every Tue and Thur, then take 1/2 tablet all other days) 90 tablet 1 02/25/2023 at 1230   ZENPEP 40000-126000 units CPEP Take 1 capsule by mouth 3 (three) times daily with meals.   02/25/2023 at pm   amiodarone (PACERONE) 200 MG tablet Take 1 tablet (200 mg) by mouth daily. 30 tablet 5    Scheduled:   amiodarone  200 mg Oral BID   aspirin EC  81 mg Oral Daily   Or   aspirin  81 mg Per Tube Daily   Or   aspirin  150 mg  Rectal Daily   atorvastatin  80 mg Oral Daily   bisacodyl  10 mg Oral Daily   Or   bisacodyl  10 mg Rectal Daily   Chlorhexidine Gluconate Cloth  6 each Topical Daily   docusate sodium  200 mg Oral Daily   feeding supplement  237 mL Oral Q24H   finasteride  5 mg Oral Daily   insulin aspart  0-24 Units Subcutaneous TID WC   isosorbide-hydrALAZINE  0.5 tablet Oral TID   losartan  12.5 mg Oral Daily   multivitamin with minerals  1 tablet Oral Daily   pantoprazole  40 mg Oral Daily   polyethylene glycol  17 g Oral Daily   sodium chloride flush  10-40 mL Intracatheter Q12H   sodium chloride flush  3 mL Intravenous Q12H   spironolactone  12.5 mg Oral Daily   tamsulosin  0.4 mg Oral QPC supper   torsemide  20 mg Oral Daily   Warfarin - Pharmacist Dosing Inpatient   Does not apply q1600   Infusions:   sodium chloride 20 mL/hr at 03/10/23 2100   sodium chloride     cefTRIAXone (ROCEPHIN)  IV 2 g (03/16/23 1849)   lactated ringers     lactated ringers Stopped (03/10/23 0729)   PRN: sodium chloride, dextrose, guaiFENesin-dextromethorphan, morphine injection, ondansetron (ZOFRAN) IV, mouth rinse, oxyCODONE, sodium chloride flush, sodium chloride flush, traMADol, traZODone  Assessment: Pt presented [facility] on 02/26/2023 with shortness of breath and SpO2 of 78 on room air. Pt has PMH of ICM (LAD infarct), HFrEF (EF <20%), LVT, VT s/p ICD, HTN, CKD3, HLD Pt underwent HMIII implantation on 9/12. Warfarin was initiated 9/15.   INR today is therapeutic at 2.1, diet improving. CBC and LDH stable.  Goal of Therapy:  2.0-2.5 Monitor platelets by anticoagulation protocol: Yes   Plan:  Warfarin 2.5mg  PO x1 Daily INR  Fredonia Highland, PharmD, BCPS, [facility] Clinical Pharmacist [phone] Please check AMION for all [facility] numbers 03/17/2023

## 2023-03-17 NOTE — Progress Notes (Signed)
CARDIAC REHAB PHASE I   PRE:  Rate/Rhythm: 80 pacing     BP: sitting 82 MAP dopplar, 77 automatic    SpO2: 97 RA  MODE:  Ambulation: 410 ft   POST:  Rate/Rhythm: 84 SR    BP: sitting 112 dopplar (taken twice)     SpO2: 97 RA  Pt eager to ambulate. Donned batteries with verbal and demonstrative cues. Deficit in strength to push connections together. Pt stood with mod assist to power up. Ambulated with RW, steady, rest x1 briefly, contact guard with gait belt. No major c/o. Pt with general flat affect. Return to recliner and assist doffing batteries. BP significantly elevated. Notified RN. Encouraged IS and walking as able.  [id number]   Ethelda Chick BS, ACSM-CEP 03/17/2023 11:52 AM

## 2023-03-17 NOTE — Plan of Care (Signed)
  Problem: Clinical Measurements: Goal: Ability to maintain clinical measurements within normal limits will improve Outcome: Progressing   Problem: Health Behavior/Discharge Planning: Goal: Ability to manage health-related needs will improve Outcome: Progressing   Problem: Skin Integrity: Goal: Risk for impaired skin integrity will decrease Outcome: Progressing   Problem: Education: Goal: Understanding of CV disease, CV risk reduction, and recovery process will improve Outcome: Progressing   Problem: Education: Goal: Individualized Educational Video(s) Outcome: Progressing

## 2023-03-18 DIAGNOSIS — I5023 Acute on chronic systolic (congestive) heart failure: Secondary | ICD-10-CM | POA: Diagnosis not present

## 2023-03-18 LAB — GLUCOSE, CAPILLARY
Glucose-Capillary: 110 mg/dL — ABNORMAL HIGH (ref 70–99)
Glucose-Capillary: 133 mg/dL — ABNORMAL HIGH (ref 70–99)
Glucose-Capillary: 131 mg/dL — ABNORMAL HIGH (ref 70–99)
Glucose-Capillary: 92 mg/dL (ref 70–99)

## 2023-03-18 LAB — BASIC METABOLIC PANEL
Anion gap: 12 (ref 5–15)
BUN: 22 mg/dL (ref 8–23)
CO2: 25 mmol/L (ref 22–32)
Calcium: 8.6 mg/dL — ABNORMAL LOW (ref 8.9–10.3)
Chloride: 93 mmol/L — ABNORMAL LOW (ref 98–111)
Creatinine, Ser: 1.33 mg/dL — ABNORMAL HIGH (ref 0.61–1.24)
GFR, Estimated: 58 mL/min — ABNORMAL LOW (ref >60)
Glucose, Bld: 100 mg/dL — ABNORMAL HIGH (ref 70–99)
Potassium: 3.7 mmol/L (ref 3.5–5.1)
Sodium: 130 mmol/L — ABNORMAL LOW (ref 135–145)

## 2023-03-18 LAB — CBC
HCT: 28.8 % — ABNORMAL LOW (ref 39.0–52.0)
Hemoglobin: 9.3 g/dL — ABNORMAL LOW (ref 13.0–17.0)
MCH: 30.9 pg (ref 26.0–34.0)
MCHC: 32.3 g/dL (ref 30.0–36.0)
MCV: 95.7 fL (ref 80.0–100.0)
Platelets: 426 10*3/uL — ABNORMAL HIGH (ref 150–400)
RBC: 3.01 MIL/uL — ABNORMAL LOW (ref 4.22–5.81)
RDW: 15.5 % (ref 11.5–15.5)
WBC: 17.4 10*3/uL — ABNORMAL HIGH (ref 4.0–10.5)
nRBC: 0.2 % (ref 0.0–0.2)

## 2023-03-18 LAB — LACTATE DEHYDROGENASE: LDH: 296 U/L — ABNORMAL HIGH (ref 98–192)

## 2023-03-18 LAB — COOXEMETRY PANEL
Carboxyhemoglobin: 2.2 % — ABNORMAL HIGH (ref 0.5–1.5)
Methemoglobin: 1.6 % — ABNORMAL HIGH (ref 0.0–1.5)
O2 Saturation: 64.4 %
Total hemoglobin: 9.6 g/dL — ABNORMAL LOW (ref 12.0–16.0)

## 2023-03-18 LAB — PROTIME-INR
INR: 2.2 — ABNORMAL HIGH (ref 0.8–1.2)
Prothrombin Time: 24.6 seconds — ABNORMAL HIGH (ref 11.4–15.2)

## 2023-03-18 MED ORDER — POTASSIUM CHLORIDE CRYS ER 20 MEQ PO TBCR
40.0000 meq | EXTENDED_RELEASE_TABLET | Freq: Once | ORAL | Status: AC
  Administered 2023-03-18: 40 meq via ORAL
  Filled 2023-03-18: qty 2

## 2023-03-18 MED ORDER — SODIUM CHLORIDE 0.9 % IV BOLUS
250.0000 mL | Freq: Once | INTRAVENOUS | Status: AC
  Administered 2023-03-18: 250 mL via INTRAVENOUS

## 2023-03-18 MED ORDER — WARFARIN SODIUM 2.5 MG PO TABS
2.5000 mg | ORAL_TABLET | Freq: Once | ORAL | Status: AC
  Administered 2023-03-18: 2.5 mg via ORAL
  Filled 2023-03-18: qty 1

## 2023-03-18 NOTE — Plan of Care (Signed)
  Problem: Safety: Goal: Ability to remain free from injury will improve Outcome: Progressing   Problem: Pain Managment: Goal: General experience of comfort will improve Outcome: Progressing   Problem: Elimination: Goal: Will not experience complications related to urinary retention Outcome: Progressing   Problem: Education: Goal: Understanding of CV disease, CV risk reduction, and recovery process will improve Outcome: Progressing

## 2023-03-18 NOTE — Progress Notes (Signed)
Patient ID: Steven [name], male   DOB: November 26, 1955, 67 y.o.   MRN: [id number]   Advanced Heart Failure VAD Team Note  PCP-Cardiologist: Dr. Gala Romney   Subjective:    9/12: HM3 LVAD Implant. Intra-op TEE LVEF 15%, RV normal  9/13: Extubated.  Driveline pulled back, 1 inch of Velour exposed.  9/14: Went into atrial fibrillation with RVR,  IV amiodarone started.  9/15: Hematuria.  9/16: SR, amio cut back to 30 mg per hour 9/17: Urology consulted for hematuria. Proscar added. Given 1 unit of blood. Ramp Echo speed increased to 5500.   Sitting up in chair. Co-ox 64%  SCr stable 1.47 => 1.33.  LDH 296.  CVP 5.   Weak but no specific complaints.  Poor appetite.   LVAD INTERROGATION:  HeartMate III LVAD:   Flow 4  liters/min, speed 5500, power 4 PI 2.  He had about 20 PI events since midnight. VAD interrogated personally.   Objective:    Vital Signs:   Temp:  [97.6 F (36.4 C)-98.3 F (36.8 C)] 97.6 F (36.4 C) (09/22 0721) Pulse Rate:  [80] 80 (09/22 0721) Resp:  [17-25] 19 (09/22 0721) BP: (84-96)/(70-78) 91/78 (09/22 0721) SpO2:  [96 %-99 %] 97 % (09/22 0721) Weight:  [68 kg] 68 kg (09/22 0330) Last BM Date : 03/14/23 Mean arterial Pressure 80s Intake/Output:   Intake/Output Summary (Last 24 hours) at 03/18/2023 1007 Last data filed at 03/18/2023 0842 Gross per 24 hour  Intake 938 ml  Output 2300 ml  Net -1362 ml     Physical Exam   General: Well appearing this am. NAD.  HEENT: Normal. Neck: Supple, JVP 7-8 cm. Carotids OK.  Cardiac:  Mechanical heart sounds with LVAD hum present.  Lungs:  CTAB, normal effort.  Abdomen:  NT, ND, no HSM. No bruits or masses. +BS  LVAD exit site: Well-healed and incorporated. Dressing dry and intact. No erythema or drainage. Stabilization device present and accurately applied. Driveline dressing changed daily per sterile technique. Extremities:  Warm and dry. No cyanosis, clubbing, rash, or edema.  Neuro:  Alert & oriented x 3. Cranial  nerves grossly intact. Moves all 4 extremities w/o difficulty. Affect pleasant    Telemetry  A pacing 80  EKG    No new EKG to review   Labs   Basic Metabolic Panel: Recent Labs  Lab 03/12/23 0330 03/12/23 0627 03/13/23 0433 03/13/23 1731 03/14/23 0521 03/15/23 0105 03/15/23 1000 03/16/23 0602 03/17/23 0510 03/18/23 0435  NA 131*   < > 130*   < > 136  --  133* 135 132* 130*  K 3.9   < > 3.2*   < > 3.9  --  3.7 3.9 4.0 3.7  CL 95*  --  90*   < > 96*  --  98 100 98 93*  CO2 23  --  27   < > 25  --  25 25 25 25   GLUCOSE 130*  --  225*   < > 128*  --  138* 109* 103* 100*  BUN 34*  --  41*   < > 38*  --  28* 25* 25* 22  CREATININE 1.72*  --  1.76*   < > 1.63*  --  1.42* 1.37* 1.44* 1.33*  CALCIUM 8.7*  --  8.3*   < > 8.8*  --  8.8* 8.6* 8.6* 8.6*  MG 2.1  --  2.0  --  2.2 2.3  --   --   --   --  PHOS 3.5  --  4.0  --  3.1 3.2  --   --   --   --    < > = values in this interval not displayed.    Liver Function Tests: Recent Labs  Lab 03/12/23 0330 03/13/23 0433 03/14/23 0521 03/17/23 0510  AST 53* 51* 46* 139*  ALT 20 25 29  103*  ALKPHOS 69 61 68 70  BILITOT 2.3* 1.8* 1.9* 1.4*  PROT 6.4* 5.7* 6.5 6.3*  ALBUMIN 3.0* 2.6* 2.8* 2.7*   No results for input(s): "LIPASE", "AMYLASE" in the last 168 hours. No results for input(s): "AMMONIA" in the last 168 hours.  CBC: Recent Labs  Lab 03/12/23 0330 03/12/23 0627 03/13/23 0433 03/13/23 1731 03/14/23 0521 03/15/23 0105 03/16/23 0602 03/16/23 1500 03/17/23 0510 03/18/23 0435  WBC 15.4*  --  11.5*  --  12.8* 12.9* 13.9* 16.4* 16.8* 17.4*  NEUTROABS 12.7*  --  9.2*  --  9.6* 9.7*  --   --   --   --   HGB 8.5*   < > 7.0*   < > 9.0* 8.4* 8.4* 9.3* 8.8* 9.3*  HCT 24.1*   < > 21.1*   < > 26.2* 24.6* 25.8* 28.9* 27.7* 28.8*  MCV 90.9  --  90.9  --  91.3 93.2 95.9 96.0 94.5 95.7  PLT 151  --  152  --  195 240 284 355 346 426*   < > = values in this interval not displayed.    INR: Recent Labs  Lab 03/14/23 0521  03/15/23 0105 03/16/23 0602 03/17/23 0510 03/18/23 0435  INR 1.5* 1.5* 1.7* 2.1* 2.2*    Other results: EKG:    Imaging   No results found.   Medications:     Scheduled Medications:  amiodarone  200 mg Oral BID   aspirin EC  81 mg Oral Daily   Or   aspirin  81 mg Per Tube Daily   Or   aspirin  150 mg Rectal Daily   atorvastatin  80 mg Oral Daily   bisacodyl  10 mg Oral Daily   Or   bisacodyl  10 mg Rectal Daily   Chlorhexidine Gluconate Cloth  6 each Topical Daily   docusate sodium  200 mg Oral Daily   feeding supplement  237 mL Oral Q24H   finasteride  5 mg Oral Daily   insulin aspart  0-24 Units Subcutaneous TID WC   multivitamin with minerals  1 tablet Oral Daily   pantoprazole  40 mg Oral Daily   polyethylene glycol  17 g Oral Daily   sacubitril-valsartan  1 tablet Oral BID   sodium chloride flush  10-40 mL Intracatheter Q12H   sodium chloride flush  3 mL Intravenous Q12H   spironolactone  12.5 mg Oral Daily   tamsulosin  0.4 mg Oral QPC supper   Warfarin - Pharmacist Dosing Inpatient   Does not apply q1600    Infusions:  sodium chloride 20 mL/hr at 03/10/23 2100   sodium chloride     cefTRIAXone (ROCEPHIN)  IV 2 g (03/17/23 1703)   lactated ringers     lactated ringers Stopped (03/10/23 0729)    PRN Medications: sodium chloride, dextrose, guaiFENesin-dextromethorphan, morphine injection, ondansetron (ZOFRAN) IV, mouth rinse, oxyCODONE, sodium chloride flush, sodium chloride flush, traMADol, traZODone   Patient Profile   Steven [name] is a 67 y.o. 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    Admitted with acute on chronic systolic and diastolic CHF with low-output. Advanced Heart Failure consulted and workup started for advanced therapies. S/p HM3 LVAD 9/12.   Assessment/Plan:    1. Acute on chronic combined systolic  and diastolic heart failure>>Low Output S/p HM3 LVAD on 9/12.  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 NYHA IV on admission.  Echo 02/27/23: EF <20%, LV with GHK, RV mildly reduced, GIIDD, LA mod dilated, mild MR.  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 GDMT limited by renal function and hypotension.  S/P HM3 LVAD implant 9/12.  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 Intra-op TEE LVEF 15%, RV normal.  - Ramp ECHO 03/13/23. Speed increased to 5500. Pain controlled with tylenol.  - CO-OX 64% MAPS 70-80s.  - CVP 5 with frequent PI events this morning.  Stop torsemide today and encourage po hydration.  - Continue Spiro 12.5 mg daily  - LDH stable.  - INR 2.2 Discussed dosing with PharmD personally. - Continue 81 mg ASA 2. 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  - on ASA/statin  3. AKI on CKD stage 3: Creatinine baseline 1.6-1.8 - SCr stable 1.3-1.4 4. Mural thrombus: Warfarin.  5. H/o VT s/p Medtronic ICD: likely scar mediated - Continue amiodarone  6. Anemia: Post-op.  - 9/17 1UPRBCs.  - Hgb 7>8>9>8.4>>8.4>>9.3   - Still with some hematuria. Will follow  7. Atrial fibrillation:  - A Paced.  - continue amio 200 mg twice a day   - On coumadin . INR 2.1 8. Hematuria:  - Urology consulted. Started on proscar.  - Suspect related to trauma with foley insertion.  - continue to monitor  - Tea-colored urine now, hematuria is slowly clearing. If persists will need eventual scope.  - Send urine cytology.  9. ID: 9/20 PCT <0.1 with CXR ok.  WBCs 17, afebrile.  - On ceftriaxone x 5 days for UTI.  10. Deconditioning: cont PT/OT. - CIR following. Should be ready early this week    I reviewed the LVAD parameters from today, and compared the results to the patient's prior recorded data.  No programming changes were made.  The LVAD is functioning within specified parameters.  The patient performs LVAD  self-test daily.  LVAD interrogation was negative for any significant power changes, alarms or PI events/speed drops.  LVAD equipment check completed and is in good working order.  Back-up equipment present.   LVAD education done on emergency procedures and precautions and reviewed exit site care.  Length of Stay: 56  Marca Ancona, MD 03/18/2023, 10:07 AM  VAD Team --- VAD ISSUES ONLY--- Pager [phone] (7am - 7am)  Advanced Heart Failure Team  Pager [phone] (M-F; 7a - 5p)  Please contact [facility] for night-coverage after hours (5p -7a ) and weekends on [email or web address]

## 2023-03-18 NOTE — Progress Notes (Addendum)
Pt more tired today. Low BP 84/71 (77). PI dropped to 1.8 from 2.4 this morning. Low CVP 3. Page MD and VAD coordinator about my concerns. Awaiting for new orders.   Verbal order from MD: 250 Bolus NS.

## 2023-03-19 DIAGNOSIS — I5023 Acute on chronic systolic (congestive) heart failure: Secondary | ICD-10-CM | POA: Diagnosis not present

## 2023-03-19 LAB — GLUCOSE, CAPILLARY
Glucose-Capillary: 95 mg/dL (ref 70–99)
Glucose-Capillary: 198 mg/dL — ABNORMAL HIGH (ref 70–99)
Glucose-Capillary: 85 mg/dL (ref 70–99)
Glucose-Capillary: 150 mg/dL — ABNORMAL HIGH (ref 70–99)

## 2023-03-19 LAB — CBC
HCT: 28.9 % — ABNORMAL LOW (ref 39.0–52.0)
Hemoglobin: 9.2 g/dL — ABNORMAL LOW (ref 13.0–17.0)
MCH: 30 pg (ref 26.0–34.0)
MCHC: 31.8 g/dL (ref 30.0–36.0)
MCV: 94.1 fL (ref 80.0–100.0)
Platelets: 439 10*3/uL — ABNORMAL HIGH (ref 150–400)
RBC: 3.07 MIL/uL — ABNORMAL LOW (ref 4.22–5.81)
RDW: 15.4 % (ref 11.5–15.5)
WBC: 17.5 10*3/uL — ABNORMAL HIGH (ref 4.0–10.5)
nRBC: 0 % (ref 0.0–0.2)

## 2023-03-19 LAB — COOXEMETRY PANEL
Carboxyhemoglobin: 1.9 % — ABNORMAL HIGH (ref 0.5–1.5)
Methemoglobin: <0.7 % (ref 0.0–1.5)
O2 Saturation: 71.3 %
Total hemoglobin: 10.1 g/dL — ABNORMAL LOW (ref 12.0–16.0)

## 2023-03-19 LAB — PROTIME-INR
INR: 2.3 — ABNORMAL HIGH (ref 0.8–1.2)
Prothrombin Time: 25.8 seconds — ABNORMAL HIGH (ref 11.4–15.2)

## 2023-03-19 LAB — BASIC METABOLIC PANEL
Anion gap: 8 (ref 5–15)
BUN: 19 mg/dL (ref 8–23)
CO2: 25 mmol/L (ref 22–32)
Calcium: 8.2 mg/dL — ABNORMAL LOW (ref 8.9–10.3)
Chloride: 98 mmol/L (ref 98–111)
Creatinine, Ser: 1.17 mg/dL (ref 0.61–1.24)
GFR, Estimated: >60 mL/min (ref >60)
Glucose, Bld: 99 mg/dL (ref 70–99)
Potassium: 4 mmol/L (ref 3.5–5.1)
Sodium: 131 mmol/L — ABNORMAL LOW (ref 135–145)

## 2023-03-19 LAB — LACTATE DEHYDROGENASE: LDH: 285 U/L — ABNORMAL HIGH (ref 98–192)

## 2023-03-19 LAB — MAGNESIUM: Magnesium: 2.1 mg/dL (ref 1.7–2.4)

## 2023-03-19 MED ORDER — POTASSIUM CHLORIDE CRYS ER 20 MEQ PO TBCR: 40.0000 meq | EXTENDED_RELEASE_TABLET | Freq: Once | ORAL | Status: DC

## 2023-03-19 MED ORDER — WARFARIN SODIUM 2.5 MG PO TABS
2.5000 mg | ORAL_TABLET | Freq: Once | ORAL | Status: AC
  Administered 2023-03-19: 2.5 mg via ORAL
  Filled 2023-03-19: qty 1

## 2023-03-19 NOTE — H&P (Signed)
Physical Medicine and Rehabilitation Admission H&P    Chief Complaint  Patient presents with   Respiratory Distress  : HPI: Steven [name] with history of ischemic cardiomyopathy/STEMI with ejection fraction of 20 to 25%, prediabetes, systolic congestive heart failure, 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  Recent admission 4/21 to 10/18/2022 for heart failure exacerbation per chart review patient lives alone independent prior to admission.  Two-level home bed bath upstairs.  Presented 02/26/2023 with dyspnea.  Chest x-ray showed severe diffuse bilateral airspace disease, worse on the right and improving on the left.  Admission chemistries unremarkable except BNP 1060, potassium 8.3.  Echocardiogram with ejection fraction less than 20%.  Left ventricle demonstrating severe decreased function.  Cardiology follow-up for acute on chronic combined systolic and diastolic congestive heart failure.  Underwent cardiac catheterization showing normal RA pressure, mild pulmonary hypertension mildly elevated wedge pressure and moderately reduced cardiac output.  CT of the chest showed extensive calcification of his pericardium particularly over the right atrium and atrioventricular groove part of the right ventricle.  His pericardial space likely obliterated due to remote pericarditis.  He was started on milrinone as well as diuresis.  Evaluated by cardiothoracic surgery for LVAD completed 03/08/2023 per Dr. Maren Beach.  He remained intubated through 03/09/2023.  Hospital course is chronic Coumadin has been resumed followed by heart failure team.  He did go into A-fib with RVR 9/14 IV amiodarone started and slowly weaned transition to p.o currently 200 mg twice daily.  Acute blood loss anemia 9.2 and monitored.  Urology  was consulted 9/17 for hematuria he was placed on Proscar and hematuria has resolved his urinalysis is unremarkable persistent leukocytosis maintained on empiric Rocephin..  Creatinine remained stable 1.74-1.17.  Therapies initiated with sternal precautions.  Therapy evaluations completed due to patient's debility and deconditioning was admitted for a comprehensive rehab program.  Review of Systems  Constitutional:  Negative for chills and fever.  HENT:  Negative for hearing loss.   Eyes:  Negative for blurred vision and double vision.  Respiratory:  Positive for shortness of breath. Negative for wheezing.   Cardiovascular:  Positive for palpitations and leg swelling. Negative for chest pain.  Gastrointestinal:  Positive for constipation. Negative for heartburn, nausea and vomiting.  Genitourinary:  Positive for hematuria and urgency. Negative for dysuria.  Musculoskeletal:  Positive for joint pain and myalgias.  Skin:  Negative for rash.  Neurological:  Positive for weakness.  All other systems reviewed and are negative.  Past Medical History:  Diagnosis Date   Acute MI, anterior wall (HCC)    AICD (automatic cardioverter/defibrillator) present    CAD (coronary artery disease)    2D ECHO, 07/13/2011 - EF <25%, LV moderatelty dilated, LA moderately dilatedLEXISCAN, 12/14/2011 - moderate-severe perfusion defect seen in the basal anteroseptal, mid anterior, apicacl anterior, apical, apical inferior, and apical lateral regions, post-stress EF 25%, new EKG changes from baseline abnormalities   Cancer [facility]    Prostate   CHF (congestive heart failure) (HCC) 2012   Hypertension 08/08/2021   Inguinal hernia, left    Pneumonia    November 2023   Pre-diabetes    Past Surgical History:  Procedure Laterality Date   CARDIAC CATHETERIZATION  11/25/2010   Predilation balloon-Apex monorail 2x66mm, Cutting balloon-2.25x43mm, resulting in a reduction of 100% stenosis down to less than 10%  CARDIAC  CATHETERIZATION  07/12/2010   LAD stented with a 3.5x23mm bare-metal Veriflex stent resulting in a reduction of 100% lesion to 0%   CARDIAC DEFIBRILLATOR PLACEMENT  03/02/2011   Medtronic Protecta XT DR, model #Z610RUE, serial #[id number] H   COLONOSCOPY     GOLD SEED IMPLANT N/A 07/21/2021   Procedure: GOLD SEED IMPLANT;  Surgeon: Heloise Purpura, MD;  Location: [facility] ORS;  Service: Urology;  Laterality: N/A;  NEEDS 30 MIN   HERNIA REPAIR  2018   ICD GENERATOR CHANGEOUT N/A 02/24/2019   Procedure: ICD GENERATOR CHANGEOUT;  Surgeon: Thurmon Fair, MD;  Location: [facility] INV[facility];  Service: Cardiovascular;  Laterality: N/A;   INSERTION OF IMPLANTABLE LEFT VENTRICULAR ASSIST DEVICE N/A 03/08/2023   Procedure: INSERTION OF IMPLANTABLE LEFT VENTRICULAR ASSIST DEVICE;  Surgeon: Alleen Borne, MD;  Location: [facility] OR;  Service: Open Heart Surgery;  Laterality: N/A;   PACEMAKER PLACEMENT  07/14/2010   Temporary placement of pacemaker, if rhythm issue continues will need a permanent device   RIGHT HEART CATH N/A 02/28/2023   Procedure: RIGHT HEART CATH;  Surgeon: Iran Ouch, MD;  Location: [facility];  Service: Cardiovascular;  Laterality: N/A;   RIGHT HEART CATH N/A 03/06/2023   Procedure: RIGHT HEART CATH;  Surgeon: Dorthula Nettles, DO;  Location: [facility];  Service: Cardiovascular;  Laterality: N/A;   SPACE OAR INSTILLATION N/A 07/21/2021   Procedure: SPACE OAR INSTILLATION;  Surgeon: Heloise Purpura, MD;  Location: [facility] OR[facility];  Service: Urology;  Laterality: N/A;   TEE WITHOUT CARDIOVERSION N/A 03/08/2023   Procedure: TRANSESOPHAGEAL ECHOCARDIOGRAM;  Surgeon: Alleen Borne, MD;  Location: [facility] OR;  Service: Open Heart Surgery;  Laterality: N/A;   History reviewed. No pertinent family history. Social History:  reports that he quit smoking about 12 years ago. His smoking use included cigarettes and cigars. He has never used smokeless tobacco. He reports current alcohol use of about  2.0 - 3.0 standard drinks of alcohol per week. He reports that he does not use drugs. Allergies: No Known Allergies Medications Prior to Admission  Medication Sig Dispense Refill   acetaminophen (TYLENOL) 500 MG tablet Take 1,000 mg by mouth every 6 (six) hours as needed for mild pain.     amiodarone (PACERONE) 200 MG tablet Take 2 tablets (400 mg total) by mouth 2 (two) times daily for 14 days, THEN 1 tablet (200 mg total) 2 (two) times daily for 14 days. 84 tablet 0   atorvastatin (LIPITOR) 80 MG tablet Take 1 tablet (80 mg total) by mouth daily. 30 tablet 3   Cholecalciferol (VITAMIN D) 125 MCG (5000 UT) CAPS Take 5,000 Units by mouth daily.     dapagliflozin propanediol (FARXIGA) 10 MG TABS tablet Take 1 tablet (10 mg total) by mouth daily. 30 tablet 3   metoprolol succinate (TOPROL-XL) 25 MG 24 hr tablet Take 1 tablet (25 mg total) by mouth daily. 30 tablet 2   Multiple Vitamin (MULTIVITAMIN) tablet Take 1 tablet by mouth daily.     nitroGLYCERIN (NITROSTAT) 0.4 MG SL tablet Place 1 tablet (0.4 mg total) under the tongue every 5 (five) minutes as needed. Usual dose is q 5 minutes x 3 doses. 75 tablet 0   potassium chloride SA (KLOR-CON M) 20 MEQ tablet Take 1 tablet (20 mEq total) by mouth daily. 30 tablet 2   sacubitril-valsartan (ENTRESTO) 24-26 MG Take 1 tablet by mouth 2 (two) times daily. 60 tablet 3   spironolactone (ALDACTONE) 25 MG  tablet Take 0.5 tablets (12.5 mg total) by mouth every other day. TAKE EOD ON EVEN DAYS 15 tablet 6   tamsulosin (FLOMAX) 0.4 MG CAPS capsule Take 0.4 mg by mouth in the morning and at bedtime.     torsemide (DEMADEX) 20 MG tablet Take 1 tablet by mouth every other day. 45 tablet 3   warfarin (COUMADIN) 5 MG tablet Take 1 tablet daily except 1/2 tablet on Wednesdays and Fridays or as directed. (Patient taking differently: Take 2.5-5 mg by mouth See admin instructions. Take 1 tablet by mouth every Tue and Thur, then take 1/2 tablet all other days) 90 tablet 1    ZENPEP 40000-126000 units CPEP Take 1 capsule by mouth 3 (three) times daily with meals.     amiodarone (PACERONE) 200 MG tablet Take 1 tablet (200 mg) by mouth daily. 30 tablet 5      Home: Home Living Family/patient expects to be discharged to:: Private residence Living Arrangements: Alone Available Help at Discharge: Other (Comment) Type of Home: House Home Access: Level entry Home Layout: Two level, Bed/bath upstairs Alternate Level Stairs-Number of Steps: flight Alternate Level Stairs-Rails: Right Bathroom Shower/Tub: Engineer, manufacturing systems: Standard Home Equipment: None   Functional History: Prior Function Prior Level of Function : Independent/Modified Independent, Driving Mobility Comments: no AD use ADLs Comments: ind  Functional Status:  Mobility: Bed Mobility Overal bed mobility: Needs Assistance Bed Mobility: Sit to Supine Sidelying to sit: Mod assist Supine to sit: Min assist Sit to supine: Min assist General bed mobility comments: pt received up in chair and returned to chair Transfers Overall transfer level: Needs assistance Equipment used: Rolling walker (2 wheels) Transfers: Sit to/from Stand, Bed to chair/wheelchair/BSC Sit to Stand: Min assist Bed to/from chair/wheelchair/BSC transfer type:: Step pivot Step pivot transfers: Min assist General transfer comment: use of heart pillow, minA to initiated rocking momentum and anterior weight shift to power up through legs due to posterior lean up against recliner Ambulation/Gait Ambulation/Gait assistance: Min assist Gait Distance (Feet): 300 Feet Assistive device: Rolling walker (2 wheels) Gait Pattern/deviations: Step-through pattern, Decreased stride length, Trunk flexed General Gait Details: minA for walker management. decreased cadence, verbal cues to look forward, verbal cues to stay in walker during turns  Sats maintained at 97% overall. Gait velocity: dec Gait velocity interpretation:  <1.31 ft/sec, indicative of household ambulator    ADL: ADL Overall ADL's : Needs assistance/impaired Eating/Feeding: Set up, Sitting Eating/Feeding Details (indicate cue type and reason): at EOB Grooming: Wash/dry hands, Wash/dry face, Oral care, Set up, Sitting Grooming Details (indicate cue type and reason): at EOB Upper Body Bathing: Moderate assistance Lower Body Bathing: Maximal assistance Upper Body Dressing : Minimal assistance, Sitting, Cueing for compensatory techniques Upper Body Dressing Details (indicate cue type and reason): at EOB Lower Body Dressing: Minimal assistance, Cueing for compensatory techniques, Sitting/lateral leans Lower Body Dressing Details (indicate cue type and reason): at EOB Toilet Transfer: Minimal assistance, BSC/3in1, Rolling walker (2 wheels), Cueing for sequencing (step-pivot) Toilet Transfer Details (indicate cue type and reason): simulated to chair Toileting- Clothing Manipulation and Hygiene: Maximal assistance Functional mobility during ADLs: Minimal assistance General ADL Comments: Pt requiring mod cues to follow sternal precautions and for compensatory techniques during dressing tasks and sit/stand transfers.  Cognition: Cognition Overall Cognitive Status: No family/caregiver present to determine baseline cognitive functioning Orientation Level: Oriented X4 Cognition Arousal: Alert Behavior During Therapy: WFL for tasks assessed/performed, Flat affect Overall Cognitive Status: No family/caregiver present to determine baseline cognitive functioning General  Comments: difficulty problem solving switching LVAD from wall unit to batteries, impaired problem solving, impaired STM, delayed processing  Physical Exam: Blood pressure 112/83, pulse 80, temperature 98.1 F (36.7 C), temperature source Oral, resp. rate 16, height 5\' 9"  (1.753 m), weight 67.9 kg, SpO2 100%. Physical Exam Constitutional:      General: He is not in acute distress.     Appearance: He is ill-appearing.  HENT:     Head: Normocephalic.     Right Ear: External ear normal.     Left Ear: External ear normal.     Nose: Nose normal.  Eyes:     Extraocular Movements: Extraocular movements intact.     Pupils: Pupils are equal, round, and reactive to light.  Cardiovascular:     Comments: LVAD Hum Pulmonary:     Effort: Pulmonary effort is normal. No respiratory distress.     Breath sounds: Normal breath sounds.  Musculoskeletal:     Cervical back: Normal range of motion.  Neurological:     Comments: Patient is alert sitting up in bed.  Follows commands.  Oriented x 3. Normal language, speech. Functional memory and awareness. UE motor 4/5. LE motor 3+/5 HF, KE 4/5, ADF/PF 4+/5. No sensory deficits appreciated. Normal muscle tone.   Psychiatric:     Comments: Pt is flat but cooperative.      Results for orders placed or performed during the hospital encounter of 02/26/23 (from the past 48 hour(s))  Glucose, capillary     Status: Abnormal   Collection Time: 03/18/23 11:06 AM  Result Value Ref Range   Glucose-Capillary 133 (H) 70 - 99 mg/dL    Comment: Glucose reference range applies only to samples taken after fasting for at least 8 hours.  Glucose, capillary     Status: Abnormal   Collection Time: 03/18/23  4:11 PM  Result Value Ref Range   Glucose-Capillary 131 (H) 70 - 99 mg/dL    Comment: Glucose reference range applies only to samples taken after fasting for at least 8 hours.  Glucose, capillary     Status: None   Collection Time: 03/18/23  9:33 PM  Result Value Ref Range   Glucose-Capillary 92 70 - 99 mg/dL    Comment: Glucose reference range applies only to samples taken after fasting for at least 8 hours.   Comment 1 Notify RN    Comment 2 Document in Chart   Lactate dehydrogenase     Status: Abnormal   Collection Time: 03/19/23  5:30 AM  Result Value Ref Range   LDH 285 (H) 98 - 192 U/L    Comment: Performed at [facility], [address] [place], [place] [address]  Protime-INR     Status: Abnormal   Collection Time: 03/19/23  5:30 AM  Result Value Ref Range   Prothrombin Time 25.8 (H) 11.4 - 15.2 seconds   INR 2.3 (H) 0.8 - 1.2    Comment: (NOTE) INR goal varies based on device and disease states. Performed at [facility], [address] [place], [place] [address]   Cooxemetry Panel (carboxy, met, total hgb, O2 sat)     Status: Abnormal   Collection Time: 03/19/23  5:30 AM  Result Value Ref Range   Total hemoglobin 10.1 (L) 12.0 - 16.0 g/dL   O2 Saturation 19.1 %   Carboxyhemoglobin 1.9 (H) 0.5 - 1.5 %   Methemoglobin <0.7 0.0 - 1.5 %    Comment: Performed at [facility]  Lab, [address] [place], [place] [address]  CBC     Status: Abnormal   Collection Time: 03/19/23  5:30 AM  Result Value Ref Range   WBC 17.5 (H) 4.0 - 10.5 K/uL   RBC 3.07 (L) 4.22 - 5.81 MIL/uL   Hemoglobin 9.2 (L) 13.0 - 17.0 g/dL   HCT 60.4 (L) 54.0 - 98.1 %   MCV 94.1 80.0 - 100.0 fL   MCH 30.0 26.0 - 34.0 pg   MCHC 31.8 30.0 - 36.0 g/dL   RDW 19.1 47.8 - 29.5 %   Platelets 439 (H) 150 - 400 K/uL   nRBC 0.0 0.0 - 0.2 %    Comment: Performed at [facility], [address], [place], [place] [address]  Basic metabolic panel     Status: Abnormal   Collection Time: 03/19/23  5:30 AM  Result Value Ref Range   Sodium 131 (L) 135 - 145 mmol/L   Potassium 4.0 3.5 - 5.1 mmol/L   Chloride 98 98 - 111 mmol/L   CO2 25 22 - 32 mmol/L   Glucose, Bld 99 70 - 99 mg/dL    Comment: Glucose reference range applies only to samples taken after fasting for at least 8 hours.   BUN 19 8 - 23 mg/dL   Creatinine, Ser 8.65 0.61 - 1.24 mg/dL   Calcium 8.2 (L) 8.9 - 10.3 mg/dL   GFR, Estimated >78 >46 mL/min    Comment: (NOTE) Calculated using the CKD-EPI Creatinine Equation (2021)    Anion gap 8 5 - 15    Comment: Performed at [facility], [address] [place], [place] [address]  Magnesium     Status: None    Collection Time: 03/19/23  5:30 AM  Result Value Ref Range   Magnesium 2.1 1.7 - 2.4 mg/dL    Comment: Performed at [facility], [address] [place], [place] [address]  Glucose, capillary     Status: None   Collection Time: 03/19/23  5:55 AM  Result Value Ref Range   Glucose-Capillary 95 70 - 99 mg/dL    Comment: Glucose reference range applies only to samples taken after fasting for at least 8 hours.   Comment 1 Notify RN    Comment 2 Document in Chart   Glucose, capillary     Status: Abnormal   Collection Time: 03/19/23 12:27 PM  Result Value Ref Range   Glucose-Capillary 198 (H) 70 - 99 mg/dL    Comment: Glucose reference range applies only to samples taken after fasting for at least 8 hours.  Glucose, capillary     Status: None   Collection Time: 03/19/23  4:19 PM  Result Value Ref Range   Glucose-Capillary 85 70 - 99 mg/dL    Comment: Glucose reference range applies only to samples taken after fasting for at least 8 hours.  Glucose, capillary     Status: Abnormal   Collection Time: 03/19/23  9:26 PM  Result Value Ref Range   Glucose-Capillary 150 (H) 70 - 99 mg/dL    Comment: Glucose reference range applies only to samples taken after fasting for at least 8 hours.   Comment 1 Notify RN    Comment 2 Document in Chart   Protime-INR     Status: Abnormal   Collection Time: 03/20/23  5:30 AM  Result Value Ref Range   Prothrombin Time 28.2 (H) 11.4 - 15.2 seconds   INR 2.6 (H) 0.8 - 1.2  Comment: (NOTE) INR goal varies based on device and disease states. Performed at [facility], [address] [place], [place] [address]   Cooxemetry Panel (carboxy, met, total hgb, O2 sat)     Status: Abnormal   Collection Time: 03/20/23  5:30 AM  Result Value Ref Range   Total hemoglobin 9.9 (L) 12.0 - 16.0 g/dL   O2 Saturation 60.4 %   Carboxyhemoglobin 2.3 (H) 0.5 - 1.5 %   Methemoglobin <0.7 0.0 - 1.5 %    Comment: Performed at [facility], [address] [place], [place] [address]  CBC     Status: Abnormal   Collection Time: 03/20/23  5:48 AM  Result Value Ref Range   WBC 18.0 (H) 4.0 - 10.5 K/uL   RBC 3.03 (L) 4.22 - 5.81 MIL/uL   Hemoglobin 9.2 (L) 13.0 - 17.0 g/dL   HCT 11.9 (L) 14.7 - 82.9 %   MCV 94.1 80.0 - 100.0 fL   MCH 30.4 26.0 - 34.0 pg   MCHC 32.3 30.0 - 36.0 g/dL   RDW 56.2 13.0 - 86.5 %   Platelets 464 (H) 150 - 400 K/uL   nRBC 0.0 0.0 - 0.2 %    Comment: Performed at [facility], [address], [place], [place] [address]   No results found.    Blood pressure 112/83, pulse 80, temperature 98.1 F (36.7 C), temperature source Oral, resp. rate 16, height 5\' 9"  (1.753 m), weight 67.9 kg, SpO2 100%.  Medical Problem List and Plan: 1. Functional deficits secondary to debility after LVAD implant 9/12.  Sternal precautions  -patient may not yet shower  -ELOS/Goals: 7-10 days, mod I goals with PT/OT 2.  Antithrombotics: -DVT/anticoagulation:  Pharmaceutical: Coumadin  -antiplatelet therapy: Aspirin 81 mg daily 3. Pain Management: Oxycodone/tramadol as needed 4. Mood/Behavior/Sleep: N/A  -antipsychotic agents: N/A 5. Neuropsych/cognition: This patient is capable of making decisions on his own behalf. 6. Skin/Wound Care: Routine skin checks  -local care to drive site  7. Fluids/Electrolytes/Nutrition: encourage PO  -hyponatremia of 128 today, trending down  -f/u labs in AM 8.  Acute on chronic combined systolic and diastolic congestive heart failure.  Follow-up per heart failure team.  Continue Aldactone 12.5 mg daily as well as Entresto 24-26 mg twice daily  -daily weights 9.  Acute blood loss anemia.  Follow-up CBC 10.  Hematuria/UTI/persistent leukocytosis.  Placed on Proscar per urology services.  Complete empiric course of Rocephin. 11.  Hyperlipidemia.  Lipitor 12.  Prediabetes.  Hemoglobin A1c 6.6.  SSI 13.  CKD stage III.  Follow-up chemistries  -recent Cr down to 1.12 14.  Atrial fibrillation.   Amiodarone 200 mg twice daily.  Continue Coumadin  Charlton Amor, PA-C 03/20/2023

## 2023-03-19 NOTE — Progress Notes (Addendum)
Physical Therapy Treatment Patient Details Name: Steven [name] MRN: [id number] DOB: [birth year] Today's Date: 03/19/2023   History of Present Illness Pt is 67 yo male admitted on 02/26/23 for acute on chronic systolic and diastolic CHF with low output.  S/P implantation of LVAD Heartmate 3  (9/12). PMHx includes but not limited to ICM, CAD, VT s/p medtronic ICD, HLD, apical mural thrombus, CKD, subdural hematoma.    PT Comments  Focused on practicing switching LVAD from wall unit to batteries/back to wall unit. Pt requiring max verbal cues and assist. Pt able to identify what he needs to switch over to batteries, ie clip, battery, holster, however continues to have difficulty disconnecting the cords and matching them appropriately. Pt with improved ambulation tolerance however remains dependent on RW for safe ambulation. Acute PT to cont to follow. Continue to recommend inpatient rehab program > 3hrs to progress to indep with LVAD management and functional mobility. Acute PT to cont to follow.   4.5 Pump Flow 5500 Pump Speed 4.0 Pump power 2.4 Pulse index   If plan is discharge home, recommend the following: A little help with walking and/or transfers;A little help with bathing/dressing/bathroom   Can travel by private vehicle        Equipment Recommendations  Other (comment) (TBD)    Recommendations for Other Services       Precautions / Restrictions Precautions Precautions: Sternal;Other (comment) (LVAD) Restrictions Weight Bearing Restrictions: No RUE Weight Bearing: Non weight bearing LUE Weight Bearing: Non weight bearing Other Position/Activity Restrictions: sternal prec     Mobility  Bed Mobility               General bed mobility comments: pt received up in chair and returned to chair    Transfers Overall transfer level: Needs assistance Equipment used: Rolling walker (2 wheels) Transfers: Sit to/from Stand, Bed to chair/wheelchair/BSC Sit to Stand: Min  assist           General transfer comment: use of heart pillow, minA to initiated rocking momentum and anterior weight shift to power up through legs due to posterior lean up against recliner    Ambulation/Gait Ambulation/Gait assistance: Min assist Gait Distance (Feet): 300 Feet Assistive device: Rolling walker (2 wheels) Gait Pattern/deviations: Step-through pattern, Decreased stride length, Trunk flexed Gait velocity: dec Gait velocity interpretation: <1.31 ft/sec, indicative of household ambulator   General Gait Details: minA for walker management. decreased cadence, verbal cues to look forward, verbal cues to stay in walker during turns  Sats maintained at 97% overall.   Stairs             Wheelchair Mobility     Tilt Bed    Modified Rankin (Stroke Patients Only)       Balance Overall balance assessment: Needs assistance Sitting-balance support: No upper extremity supported, Feet supported Sitting balance-Leahy Scale: Good Sitting balance - Comments: no external support seated EOB   Standing balance support: During functional activity, Reliant on assistive device for balance Standing balance-Leahy Scale: Poor Standing balance comment: reliant on external support                            Cognition Arousal: Alert Behavior During Therapy: WFL for tasks assessed/performed, Flat affect Overall Cognitive Status: No family/caregiver present to determine baseline cognitive functioning  General Comments: difficulty problem solving switching LVAD from wall unit to batteries, impaired problem solving, impaired STM, delayed processing        Exercises      General Comments General comments (skin integrity, edema, etc.): VSS. worked on switching LVAD from wall unit to batteries and back to wall unit. Pt requiring max directional verbal cues. Pt able to state equip he needs however was trying to connect  the wall lines to the battery or white cord to black      Pertinent Vitals/Pain Pain Assessment Pain Assessment: No/denies pain    Home Living                          Prior Function            PT Goals (current goals can now be found in the care plan section) Acute Rehab PT Goals Patient Stated Goal: not stated PT Goal Formulation: With patient Time For Goal Achievement: 03/23/23 Potential to Achieve Goals: Good Progress towards PT goals: Progressing toward goals    Frequency    Min 1X/week      PT Plan      Co-evaluation              AM-PAC PT "6 Clicks" Mobility   Outcome Measure  Help needed turning from your back to your side while in a flat bed without using bedrails?: A Little Help needed moving from lying on your back to sitting on the side of a flat bed without using bedrails?: A Lot Help needed moving to and from a bed to a chair (including a wheelchair)?: A Little Help needed standing up from a chair using your arms (e.g., wheelchair or bedside chair)?: A Little Help needed to walk in hospital room?: A Little Help needed climbing 3-5 steps with a railing? : A Lot 6 Click Score: 16    End of Session   Activity Tolerance: Patient tolerated treatment well Patient left: with call bell/phone within reach;with nursing/sitter in room;in chair Nurse Communication: Mobility status PT Visit Diagnosis: Other abnormalities of gait and mobility (R26.89);Other (comment)     Time: 1340-1405 PT Time Calculation (min) (ACUTE ONLY): 25 min  Charges:    $Gait Training: 8-22 mins $Therapeutic Activity: 8-22 mins PT General Charges $$ ACUTE PT VISIT: 1 Visit                     Lewis Shock, PT, DPT Acute Rehabilitation Services Secure chat preferred Office #: [phone]    Iona Hansen 03/19/2023, 2:33 PM

## 2023-03-19 NOTE — Progress Notes (Signed)
VAD Discharge Teaching Note:  Discharge VAD teaching completed with patient at bedside.  The home inspection checklist has been reviewed and no unsafe conditions have been identified. Family reports that there are at least two dedicated grounded, 3-prong outlets with clearly labeled circuit breaker has been established in the bedroom for power module and Magazine features editor.   Both patient and caregiver have been trained on the following:  1. HM III LVAD overview of system operations  2. Overview of major lifestyle accommodations and cautions   3. Overview of system components (features and functions) 4. Changing power sources 5. Overview of alerts and alarms 6. How to identify and manage an emergency including when pump is running and when pump has stopped  7. Changing system controller 8. Maintain emergency contact list and medications  A daily flow sheet with patient  weight, temperature,  flow, speed, power, and PI, along with daily self checks on system controller and power module have been performed by patient and caregiver during hospitalization and will also be done daily at home.   The following routine activities and maintenance have been reviewed with patient and caregiver and both verbalize understanding:  1. Stressed importance of never disconnecting power from both controller power leads at the same time, and never disconnecting both batteries at the same time, or the pump will alarm and eventually stop if no power supply restored.  2. Plug the mobile power unit (MPU) and the universal battery charger (UBC) into properly grounded (3 prong) outlets dedicated to PM use. Do NOT use adapter (cheater plug) for ungrounded outlets or multiple portable socket outlets (power strips) 3. Do not connect the PM or MPU to an outlet controlled by wall switch or the device may not work 4. Transfer from MPU to batteries during [facility] mains power failure. The PM has internal backup battery that will  power the pump while you transfer to batteries 5. Keep a backup system controller, charged batteries, battery clips, and flashlight near you during sleep in case of electrical power outage 6. Clean battery, battery clip, and universal battery charger contacts weekly 7. Visually inspect percutaneous lead daily 8. Check cables and connectors when changing power source  9. Rotate batteries; keep all eight batteries charged 10. Always have backup system controller, battery clips, fully charged batteries, and spare fully charged batteries when traveling 11. Re-calibrate batteries every 70 uses; monitor battery life of 36 months or 360 uses; replace batteries at end of battery life  Identified the following changes in activities of daily living with pump:  1. No driving for at least six weeks and then only if doctor gives permission to do so 2. No tub baths while pump implanted, and shower only if doctor gives permission 3. No swimming or submersion in water while implanted with pump 4. Keep all VAD equipment away from water or moisture 5. Keep all VAD connections clean and dry 6. No contact sports or engage in jumping activities 7. Never have an MRI while implanted with the pump 8. Never leave or store batteries in extremely hot or cold places (such as   trunk of your car), or the battery life will be shortened 9. Call the doctor or hospital contact person if any change in how the pump sounds, feels, or works 10. Plan to sleep only when connected to the mobile power unit. [name] 11. Keep a backup system controller, charged batteries, battery clips, and flashlight near you during sleep in case of electrical power outage 12.  Do not sleep on your stomach 13. Talk with doctor before any long distance travel plans 14. Patient will need antibiotics prior to any dental procedure; instructed to contact VAD coordinator before any dental procedures (including routine cleaning)  Discharge binder given to  patient and include the following: 1. List of emergency contacts 2. Wallet card 3. HM III Luggage tags 4. HM III Alarms for Patients and their Caregivers 5. HM III Patient Handbook 6. HM III Patient Education Program DVD 7. Daily diary sheets 8. Warfarin teaching sheets 9. Nosebleed teaching sheets 10. Medications you may and may not take with CHF list  Discussed frequency and importance of INR checks; emphasized importance of maintaining INR goal to prevent clotting and or bleeding issues with pump.  Patient able to answer questions and asked good questions pertaining to warfarin and diet/lifestyle changes necessary to be successful and safe. Patient will have INR managed by VAD Clinic; current INR goal is 2.0 - 2.5.    The patient has completed a proficiency test for the HM III and all questions have been answered. The pt and family have been instructed to call if any questions, problems, or concerns arise. Pt successfully paged VAD coordinator using VAD pager emergency number and have been instructed to use this number only for emergencies. Patient and caregiver asked appropriate questions, had good interaction with VAD coordinator, and verbalized understanding of above instructions.   Pt discharging to his house per original plan. His caregivers plan to stay with him/her for the first 2 weeks, and as needed after initial 2- week period.  Caregiver will plan to change drive line dressing daily. Pt verbalized agreement with this plan.   Pt caregiver education to be completed later this week as she was unable to attend. VAD Coordinator plans to reach out tomorrow to schedule education.  Simmie Davies RN,BSN VAD Coordinator  Office: [phone] 24/7 VAD Pager: [phone]

## 2023-03-19 NOTE — Progress Notes (Addendum)
Patient ID: Steven [name], male   DOB: 01-23-1956, 67 y.o.   MRN: [id number]   Advanced Heart Failure VAD Team Note  PCP-Cardiologist: Dr. Gala Romney   Subjective:    9/12: HM3 LVAD Implant. Intra-op TEE LVEF 15%, RV normal  9/13: Extubated.  Driveline pulled back, 1 inch of Velour exposed.  9/14: Went into atrial fibrillation with RVR,  IV amiodarone started.  9/15: Hematuria.  9/16: SR, amio cut back to 30 mg per hour 9/17: Urology consulted for hematuria. Proscar added. Given 1 unit of blood. Ramp Echo speed increased to 5500.   Low PIs overnight (1.8) w/ low CVP and low BP=> given 250 Bolus NS  CVP now up from 3>>6. MAP 76.  Co-ox 71%. Rhythm stable.   Sitting up in chair. Feels ok. Denies dyspnea. No cough. Hematuria resolving. Hgb stable 9.2. INR 2.3   Eating breakfast but not much of an appetite.    LVAD INTERROGATION:  HeartMate III LVAD:   Flow 4.6  liters/min, speed 5500, power 3.9 PI 5.2. Numerous PI events this morning. VAD interrogated personally.   Objective:    Vital Signs:   Temp:  [97.3 F (36.3 C)-99 F (37.2 C)] 97.3 F (36.3 C) (09/23 0716) Pulse Rate:  [80-120] 96 (09/23 0716) Resp:  [15-20] 15 (09/23 0716) BP: (77-85)/(56-71) 81/71 (09/23 0716) SpO2:  [92 %-98 %] 98 % (09/23 0716) Weight:  [67.5 kg] 67.5 kg (09/23 0500) Last BM Date : 03/18/23 Mean arterial Pressure 76 Intake/Output:   Intake/Output Summary (Last 24 hours) at 03/19/2023 0727 Last data filed at 03/19/2023 0102 Gross per 24 hour  Intake 1014.18 ml  Output 2301 ml  Net -1286.82 ml     Physical Exam   CVP 6 cm  General:  Well appearing, thin male, sitting up in chair. No distress.   HEENT: normal  Neck: supple. JVP 6 cm.  Carotids 2+ bilat; no bruits. No lymphadenopathy or thryomegaly appreciated. Cor: LVAD hum.  Lungs: decreased BS at the bases bilaterally. No wheezing  Abdomen: soft, nontender, non-distended. No hepatosplenomegaly. No bruits or masses. Good bowel sounds.  Driveline site clean. Anchor in place.  Extremities: no cyanosis, clubbing, rash. Warm no edema  Neuro: alert & oriented x 3. No focal deficits. Moves all 4 without problem    Telemetry   A-paced 80s, personally reviewed   EKG    No new EKG to review   Labs   Basic Metabolic Panel: Recent Labs  Lab 03/13/23 0433 03/13/23 1731 03/14/23 0521 03/15/23 0105 03/15/23 1000 03/16/23 0602 03/17/23 0510 03/18/23 0435 03/19/23 0530  NA 130*   < > 136  --  133* 135 132* 130* 131*  K 3.2*   < > 3.9  --  3.7 3.9 4.0 3.7 4.0  CL 90*   < > 96*  --  98 100 98 93* 98  CO2 27   < > 25  --  25 25 25 25 25   GLUCOSE 225*   < > 128*  --  138* 109* 103* 100* 99  BUN 41*   < > 38*  --  28* 25* 25* 22 19  CREATININE 1.76*   < > 1.63*  --  1.42* 1.37* 1.44* 1.33* 1.17  CALCIUM 8.3*   < > 8.8*  --  8.8* 8.6* 8.6* 8.6* 8.2*  MG 2.0  --  2.2 2.3  --   --   --   --  2.1  PHOS 4.0  --  3.1 3.2  --   --   --   --   --    < > =  values in this interval not displayed.    Liver Function Tests: Recent Labs  Lab 03/13/23 0433 03/14/23 0521 03/17/23 0510  AST 51* 46* 139*  ALT 25 29 103*  ALKPHOS 61 68 70  BILITOT 1.8* 1.9* 1.4*  PROT 5.7* 6.5 6.3*  ALBUMIN 2.6* 2.8* 2.7*   No results for input(s): "LIPASE", "AMYLASE" in the last 168 hours. No results for input(s): "AMMONIA" in the last 168 hours.  CBC: Recent Labs  Lab 03/13/23 0433 03/13/23 1731 03/14/23 0521 03/15/23 0105 03/16/23 0602 03/16/23 1500 03/17/23 0510 03/18/23 0435 03/19/23 0530  WBC 11.5*  --  12.8* 12.9* 13.9* 16.4* 16.8* 17.4* 17.5*  NEUTROABS 9.2*  --  9.6* 9.7*  --   --   --   --   --   HGB 7.0*   < > 9.0* 8.4* 8.4* 9.3* 8.8* 9.3* 9.2*  HCT 21.1*   < > 26.2* 24.6* 25.8* 28.9* 27.7* 28.8* 28.9*  MCV 90.9  --  91.3 93.2 95.9 96.0 94.5 95.7 94.1  PLT 152  --  195 240 284 355 346 426* 439*   < > = values in this interval not displayed.    INR: Recent Labs  Lab 03/15/23 0105 03/16/23 0602 03/17/23 0510  03/18/23 0435 03/19/23 0530  INR 1.5* 1.7* 2.1* 2.2* 2.3*    Other results: EKG:    Imaging   No results found.   Medications:     Scheduled Medications:  amiodarone  200 mg Oral BID   aspirin EC  81 mg Oral Daily   Or   aspirin  81 mg Per Tube Daily   atorvastatin  80 mg Oral Daily   bisacodyl  10 mg Oral Daily   Or   bisacodyl  10 mg Rectal Daily   Chlorhexidine Gluconate Cloth  6 each Topical Daily   docusate sodium  200 mg Oral Daily   feeding supplement  237 mL Oral Q24H   finasteride  5 mg Oral Daily   insulin aspart  0-24 Units Subcutaneous TID WC   multivitamin with minerals  1 tablet Oral Daily   pantoprazole  40 mg Oral Daily   polyethylene glycol  17 g Oral Daily   sacubitril-valsartan  1 tablet Oral BID   sodium chloride flush  10-40 mL Intracatheter Q12H   sodium chloride flush  3 mL Intravenous Q12H   spironolactone  12.5 mg Oral Daily   tamsulosin  0.4 mg Oral QPC supper   warfarin  2.5 mg Oral ONCE-1600   Warfarin - Pharmacist Dosing Inpatient   Does not apply q1600    Infusions:  sodium chloride 20 mL/hr at 03/10/23 2100   sodium chloride     cefTRIAXone (ROCEPHIN)  IV Stopped (03/18/23 1741)   lactated ringers     lactated ringers Stopped (03/10/23 0729)    PRN Medications: sodium chloride, dextrose, guaiFENesin-dextromethorphan, morphine injection, ondansetron (ZOFRAN) IV, mouth rinse, oxyCODONE, sodium chloride flush, sodium chloride flush, traMADol, traZODone   Patient Profile   Steven [name] is a 67 y.o. 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    Admitted with acute on chronic systolic and diastolic CHF with low-output. Advanced Heart Failure consulted and workup started for advanced therapies. S/p HM3 LVAD 9/12.   Assessment/Plan:    1. Acute on chronic combined systolic and diastolic heart  failure>>Low Output S/p HM3 LVAD on  9/12.  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 NYHA IV on admission.  Echo 02/27/23: EF <20%, LV with GHK, RV mildly reduced, GIIDD, LA mod dilated, mild MR.  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 GDMT limited by renal function and hypotension.  S/P HM3 LVAD implant 9/12.  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 Intra-op TEE LVEF 15%, RV normal.  - Ramp ECHO 03/13/23. Speed increased to 5500. Pain controlled with tylenol.  - CO-OX 71% MAPS mid 70s   - CVP 5-6. Torsemide PRN (does not need today)  - Continue Spiro 12.5 mg daily  - Holding losartan and Bidil w/ soft BPs - LDH stable.  - INR 2.3 Discussed dosing with PharmD personally. - Continue 81 mg ASA 2. 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  -  no s/s angina - on ASA/statin  3. AKI on CKD stage 3: Creatinine baseline 1.6-1.8 - SCr stable 1.17 - follow w/ diuretics + GDMT titration  4. Mural thrombus: Warfarin.  5. H/o VT s/p Medtronic ICD: likely scar mediated - Continue amiodarone  6. Anemia: Post-op.  9/17 1UPRBCs.  Hgb 7>8>9>8.4>>8.4>9.2    - hematuria resolving  7. Atrial fibrillation:  - A Paced.  - continue amio 200 mg twice a day   - On coumadin. INR 2.3 8. Hematuria:  - Urology consulted. Started on proscar.  - Suspect related to trauma with foley insertion.  - seems to be improved. Continue to follow.  9. ID: mTemp overnight 99.1 on 9/19. CXR and PCT ok. ?UTI. Started on ceftriaxone  - now AF but c/w persistent leukocytosis. ? Atelectasis. Continue pulmonary toilet  10. Deconditioning: cont PT/OT. - CIR following. Should be ready early this week    I reviewed the LVAD parameters from today, and compared the results to the patient's prior recorded data.  No programming changes were made.  The LVAD is functioning within specified parameters.  The patient performs LVAD self-test  daily.  LVAD interrogation was negative for any significant power changes, alarms or PI events/speed drops.  LVAD equipment check completed and is in good working order.  Back-up equipment present.   LVAD education done on emergency procedures and precautions and reviewed exit site care.  Length of Stay: [address] PA-C 03/19/2023, 7:27 AM  VAD Team --- VAD ISSUES ONLY--- Pager [phone] (7am - 7am)  Advanced Heart Failure Team  Pager [phone] (M-F; 7a - 5p)  Please contact [facility] for night-coverage after hours (5p -7a ) and weekends on [email or web address]  Patient seen and examined with the above-signed Advanced Practice Provider and/or Housestaff. I personally reviewed laboratory data, imaging studies and relevant notes. I independently examined the patient and formulated the important aspects of the plan. I have edited the note to reflect any of my changes or salient points. I have personally discussed the plan with the patient and/or family.  Remains weak. Denies SOB or CP. Was able to ambulate halls some yesterday. Co-ox and CVP stable.   INR 2.3   General:  NAD.  HEENT: normal  Neck: supple. JVP not elevated.  Carotids 2+ bilat; no bruits. No lymphadenopathy or thryomegaly appreciated. Cor: LVAD hum.  Lungs: Clear. Abdomen: soft, nontender, non-distended. No hepatosplenomegaly. No bruits or masses. Good bowel sounds. Driveline site clean. Anchor in place.  Extremities: no cyanosis, clubbing, rash. Warm no edema  Neuro: alert & oriented x 3. No  focal deficits. Moves all 4 without problem   Improving steadily but still weak. CVP and co-ox ok  MAPs improved.   INR 2.3 Discussed dosing with PharmD personally.  VAD teaching completed this am.   VAD interrogated personally. Parameters stable.  He is ready for transfer to CIR when bed available.   Arvilla Meres, MD  2:50 PM

## 2023-03-19 NOTE — Progress Notes (Signed)
ANTICOAGULATION CONSULT NOTE - Follow-up  Pharmacy Consult for warfarin management Indication:  LVAD  No Known Allergies  Patient Measurements: Height: 5\' 9"  (175.3 cm) Weight: 67.5 kg (148 lb 13 oz) IBW/kg (Calculated) : 70.7 Heparin Dosing Weight: 73.5 kg  Vital Signs: Temp: 97.3 F (36.3 C) (09/23 0716) Temp Source: Oral (09/23 0716) BP: 81/71 (09/23 0716) Pulse Rate: 96 (09/23 0716)  Labs: Recent Labs    03/17/23 0510 03/18/23 0435 03/19/23 0530  HGB 8.8* 9.3* 9.2*  HCT 27.7* 28.8* 28.9*  PLT 346 426* 439*  LABPROT 23.6* 24.6* 25.8*  INR 2.1* 2.2* 2.3*  CREATININE 1.44* 1.33* 1.17    Estimated Creatinine Clearance: 57.7 mL/min (by C-G formula based on SCr of 1.17 mg/dL).   Medical History: Past Medical History:  Diagnosis Date   Acute MI, anterior wall (HCC)    AICD (automatic cardioverter/defibrillator) present    CAD (coronary artery disease)    2D ECHO, 07/13/2011 - EF <25%, LV moderatelty dilated, LA moderately dilatedLEXISCAN, 12/14/2011 - moderate-severe perfusion defect seen in the basal anteroseptal, mid anterior, apicacl anterior, apical, apical inferior, and apical lateral regions, post-stress EF 25%, new EKG changes from baseline abnormalities   Cancer (HCC)    Prostate   CHF (congestive heart failure) (HCC) 2012   Hypertension 08/08/2021   Inguinal hernia, left    Pneumonia    November 2023   Pre-diabetes     Medications:  Medications Prior to Admission  Medication Sig Dispense Refill Last Dose   acetaminophen (TYLENOL) 500 MG tablet Take 1,000 mg by mouth every 6 (six) hours as needed for mild pain.   Past Week   amiodarone (PACERONE) 200 MG tablet Take 2 tablets (400 mg total) by mouth 2 (two) times daily for 14 days, THEN 1 tablet (200 mg total) 2 (two) times daily for 14 days. 84 tablet 0 02/25/2023 at am   atorvastatin (LIPITOR) 80 MG tablet Take 1 tablet (80 mg total) by mouth daily. 30 tablet 3 02/25/2023 at am   Cholecalciferol (VITAMIN D)  125 MCG (5000 UT) CAPS Take 5,000 Units by mouth daily.   02/25/2023 at am   dapagliflozin propanediol (FARXIGA) 10 MG TABS tablet Take 1 tablet (10 mg total) by mouth daily. 30 tablet 3 02/25/2023 at am   metoprolol succinate (TOPROL-XL) 25 MG 24 hr tablet Take 1 tablet (25 mg total) by mouth daily. 30 tablet 2 02/25/2023 at am   Multiple Vitamin (MULTIVITAMIN) tablet Take 1 tablet by mouth daily.   02/25/2023 at am   nitroGLYCERIN (NITROSTAT) 0.4 MG SL tablet Place 1 tablet (0.4 mg total) under the tongue every 5 (five) minutes as needed. Usual dose is q 5 minutes x 3 doses. 75 tablet 0 02/26/2023   potassium chloride SA (KLOR-CON M) 20 MEQ tablet Take 1 tablet (20 mEq total) by mouth daily. 30 tablet 2 02/25/2023 at am   sacubitril-valsartan (ENTRESTO) 24-26 MG Take 1 tablet by mouth 2 (two) times daily. 60 tablet 3 02/25/2023 at am   spironolactone (ALDACTONE) 25 MG tablet Take 0.5 tablets (12.5 mg total) by mouth every other day. TAKE EOD ON EVEN DAYS 15 tablet 6 02/24/2023 at am   tamsulosin (FLOMAX) 0.4 MG CAPS capsule Take 0.4 mg by mouth in the morning and at bedtime.   02/25/2023 at am   torsemide (DEMADEX) 20 MG tablet Take 1 tablet by mouth every other day. 45 tablet 3 02/24/2023 at am   warfarin (COUMADIN) 5 MG tablet Take 1 tablet daily except  1/2 tablet on Wednesdays and Fridays or as directed. (Patient taking differently: Take 2.5-5 mg by mouth See admin instructions. Take 1 tablet by mouth every Tue and Thur, then take 1/2 tablet all other days) 90 tablet 1 02/25/2023 at 1230   ZENPEP 40000-126000 units CPEP Take 1 capsule by mouth 3 (three) times daily with meals.   02/25/2023 at pm   amiodarone (PACERONE) 200 MG tablet Take 1 tablet (200 mg) by mouth daily. 30 tablet 5    Scheduled:   amiodarone  200 mg Oral BID   aspirin EC  81 mg Oral Daily   Or   aspirin  81 mg Per Tube Daily   atorvastatin  80 mg Oral Daily   bisacodyl  10 mg Oral Daily   Or   bisacodyl  10 mg Rectal Daily   Chlorhexidine  Gluconate Cloth  6 each Topical Daily   docusate sodium  200 mg Oral Daily   feeding supplement  237 mL Oral Q24H   finasteride  5 mg Oral Daily   insulin aspart  0-24 Units Subcutaneous TID WC   multivitamin with minerals  1 tablet Oral Daily   pantoprazole  40 mg Oral Daily   polyethylene glycol  17 g Oral Daily   sacubitril-valsartan  1 tablet Oral BID   sodium chloride flush  10-40 mL Intracatheter Q12H   sodium chloride flush  3 mL Intravenous Q12H   spironolactone  12.5 mg Oral Daily   tamsulosin  0.4 mg Oral QPC supper   warfarin  2.5 mg Oral ONCE-1600   Warfarin - Pharmacist Dosing Inpatient   Does not apply q1600   Infusions:   sodium chloride 20 mL/hr at 03/10/23 2100   sodium chloride     cefTRIAXone (ROCEPHIN)  IV Stopped (03/18/23 1741)   lactated ringers     lactated ringers Stopped (03/10/23 0729)   PRN: sodium chloride, dextrose, guaiFENesin-dextromethorphan, morphine injection, ondansetron (ZOFRAN) IV, mouth rinse, oxyCODONE, sodium chloride flush, sodium chloride flush, traMADol, traZODone  Assessment: Pt presented to [facility] ED on 02/26/2023 with shortness of breath and SpO2 of 78 on room air. Pt has PMH of ICM (LAD infarct), HFrEF (EF <20%), LVT, VT s/p ICD, HTN, CKD3, HLD Pt underwent HMIII implantation on 9/12. Warfarin was initiated 9/15.   INR today is therapeutic at 2.3, diet improving. CBC and LDH stable.  Goal of Therapy:  2.0-2.5 Monitor platelets by anticoagulation protocol: Yes   Plan:  Warfarin 2.5mg  PO x1 Daily INR, CBC, LDH  Wilmer Floor, PharmD PGY2 Cardiology Pharmacy Resident Please check AMION for all [facility] numbers 03/19/2023

## 2023-03-19 NOTE — TOC Progression Note (Signed)
Transition of Care [facility]) - Progression Note    Patient Details  Name: Steven [name] MRN: [id number] Date of Birth: [birth year]  Transition of [facility]) CM/SW Contact  Elliot Cousin, RN Phone Number: [phone] 03/19/2023, 2:05 PM  Clinical Narrative:  CM spoke to pt and waiting available CIR bed. States he wants to get stronger before returning home.      Expected Discharge Plan: Home/Self Care Barriers to Discharge: No Barriers Identified  Expected Discharge Plan and Services   Discharge Planning Services: CM Consult Post Acute Care Choice: IP Rehab Living arrangements for the past 2 months: Single Family Home                                       Social Determinants of Health (SDOH) Interventions SDOH Screenings   Food Insecurity: No Food Insecurity (02/26/2023)  Housing: High Risk (02/26/2023)  Transportation Needs: No Transportation Needs (02/26/2023)  Utilities: Not At Risk (02/26/2023)  Alcohol Screen: Low Risk  (05/08/2022)  Financial Resource Strain: Low Risk  (05/08/2022)  Social Connections: Unknown (11/08/2021)   Received from [facility]  Tobacco Use: Medium Risk (03/08/2023)    Readmission Risk Interventions     No data to display

## 2023-03-19 NOTE — Progress Notes (Addendum)
LVAD Coordinator Rounding Note:  Admitted 02/26/23 due to CHF.   HM3 LVAD implanted on 03/08/23 by Dr Laneta Simmers under DT criteria.  9/12: HM3 LVAD Implant. Intra-op TEE LVEF 15%, RV normal  9/13: Extubated.  Driveline pulled back, 1 inch of Velour exposed.  9/14: Went into atrial fibrillation with RVR,  IV amiodarone started.  9/15: Hematuria.  9/17: Urology consulted for hematuria. Proscar added. Given 1 unit of blood. Ramp Echo speed increased to 5500.   Pt up in the chair on my arrival. States he "is tired of being cooped up". He has been working with therapy and states he is feeling stronger. Education completed today with pt. Please see separate note for details. Pt caregiver Suzette Battiest unable to come today for teaching d/t not feeling well. VAD Coordinator to reach out tomorrow to reschedule education with her.   Plan for CIR once medically stable. Insurance authorization obtained; however, no beds on CIR today.  Vital signs: Temp: 97.6 HR: 80 Doppler Pressure: 72 Auto BP: 86/69 (78) O2 Sat: 97% on RA Wt:155.6>169.5>162>153.4>159.3>153.4>151.9>148.8 lbs    LVAD interrogation reveals:  Speed: 5500 Flow: 4.0 Power: 3.9 w  PI: 2.3  Alarms: none  Events:  >100 PI events Hematocrit: 25  Fixed speed: 5500 Low speed limit: 5200   Drive Line: Existing VAD dressing removed and site care performed using sterile technique. Drive line exit site cleaned with Chlora prep applicators x 2, allowed to dry, and gauze dressing with Silver strip applied. There is a single suture present. The driveline has been pulled out approx 1". Exit site unincorporated, the velour is now exposed approx 1" at exit site. Scant amount of dried bloody drainage on previous silver strip. No redness, tenderness, foul odor or rash noted. Drive line anchor re-applied. Pt denies fever or chills. Advance to Monday-Wednesday-Friday dressing changes. Next dressing change due 03/21/23 by nurse champion or VAD coordinator.      Labs:  LDH trend: 234>310>274>302>318>306>285  INR trend: 1.6>1.5>1.6>1.5>1.7>2.3  Anticoagulation Plan: -INR Goal: 2-2.5 -ASA Dose: 81 mg   Blood Products:  -Intra op: 03/08/23 1 PRBC 2 FFP 1 PLT 1 Cryo 450 cell saver  Post op: 03/08/23 1 PRBC 2 FFP 1 PLT 1 Cryo  03/09/23: 1 PRBC 03/13/23: 1 PRBC  Device: -Medtronic -Therapies: ON  -VF > 200BMP  Arrythmias: afib RVR-on amio gtt  Respiratory: on RA  Infection:   Renal:  -CRT: 1.28>1.72>1.76>1.63>1.37>1.17  Patient Education: All discharge education completed at bedside today with pt. Please see separate note for details.  Adverse Events on VAD:  Plan/Recommendations:  1. Please page VAD coordinator with any equipment issues or driveline problems. 2. Continue every other day dressing changes using silver strip by VAD coordinator or nurse champion. Next dressing due 03/21/23.  Simmie Davies RN,BSN VAD Coordinator  Office: [phone]  24/7 Pager: [phone]

## 2023-03-19 NOTE — Progress Notes (Addendum)
CSW attempted to visit patient at bedside although resting comfortably. CSW did not wake. CSW informed that teaching occurred today with patient and no concerns. CSW will continue to follow as needed.Lasandra Beech, LCSW, CCSW-MCS [phone]

## 2023-03-19 NOTE — Progress Notes (Signed)
Inpatient Rehab Admissions Coordinator:   I have no beds available for this patient to admit to CIR today.  Will continue to follow for timing of potential admission pending bed availability. I did receive insurance authorization.   Estill Dooms, PT, DPT Admissions Coordinator [phone] 03/19/23  11:03 AM

## 2023-03-19 NOTE — Discharge Summary (Incomplete)
Advanced Heart Failure Team  Discharge Summary   Patient ID: Steven [name] MRN: [id number], DOB/AGE: [birth year] 67 y.o. Admit date: 02/26/2023 D/C date:     03/20/2023   Primary Discharge Diagnoses:  Acute on Chronic Systolic Heart Failure, End-Stage, Inotrope Dependent S/p HMIII LVAD  AKI on CKD IIIa Atrial Fibrillation w/ RVR Hematuria (foley trauma)  Anemia (post-op blood loss + hematuria) H/o VT, s/p ICD H/o Mural Thrombus UTI    Hospital Course:   Haydyn P Sinn is a 67 y.o. 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    Originally from [place], suffered a large out of hospital anterior wall myocardial infarction January 2012.  He presented after a three-hour delay to the hospital and was taken emergently for cath with DES to LAD. EF was 35-40% at time of MI. Unfortunately, did not have recovery of EF. C/w progressive decline in systolic function over the years. Ultimately underwent ICD.   Most recently, he has struggled w/ progressive HF w/ worsening cardiorenal syndrome, hypotension requiring down titration of GDMT and recent VT w/ syncope.   Admitted on 02/26/23 w/ NYHA Class IV symptoms and low output. Echo showed EF < 20%, RV mildly reduced. RHC showed with low output. CI 2.1. Calcified pericardium (no H/o TB). Scr rose to > 2.0. He was placed on inotropic support w/ milrinone and VAD work-up started. Felt to be inotrope dependent, as he did not tolerate milrinone wean. Decision made to proceed w/ LVAD implantation prior to d/c. Ultimately required addition of NE for BP support to help w/ renal perfusion to better optimize pre-VAD. Underwent repeat RHC w/ placement of continuous swan to help guide therapies. Once optimized, he underwent HMIII LVAD implant on 03/08/23. Intra-op TEE LVEF 15%, RV normal. Successfully extubated on POD#1. Able to wean off pressor  support. Ramp ECHO 03/13/23. Speed increased to 5500. Post op course was c/b afib w/ RVR, requiring IV amiodarone. Converted back to NSR. Also w/ hematuria, requiring urology consult. Hematuria was felt related to trauma with foley insertion + suspected UTI. He was started on Proscar and 5 day course of ceftriaxone. Hematuria ultimately resolved and H/H remained stable.   Due to deconditioning, PT/OT recommended CIR. Seen and examined by Dr. Gala Romney on 03/20/23 deemed medically stable for discharge to CIR.   LVAD INTERROGATION:  HeartMate III LVAD:   Flow 4.5  liters/min, speed 5500, power 4.0 PI 3.5. Numerous PI events this morning. VAD interrogated personally.  MAP 76     Discharge Weight Range: 149 lb Discharge Vitals: Blood pressure (!) 72/60, pulse 80, temperature 97.6 F (36.4 C), temperature source Oral, resp. rate (!) 21, height 5\' 9"  (1.753 m), weight 67.9 kg, SpO2 97%.  Labs: Lab Results  Component Value Date   WBC 18.0 (H) 03/20/2023   HGB 9.2 (L) 03/20/2023   HCT 28.5 (L) 03/20/2023   MCV 94.1 03/20/2023   PLT 464 (H) 03/20/2023    Recent Labs  Lab 03/17/23 0510 03/18/23 0435 03/20/23 0530  NA 132*   < > 128*  K 4.0   < > 4.0  CL 98   < > 98  CO2 25   < > 21*  BUN 25*   < > 20  CREATININE 1.44*   < > 1.12  CALCIUM 8.6*   < > 7.9*  PROT 6.3*  --   --  BILITOT 1.4*  --   --   ALKPHOS 70  --   --   ALT 103*  --   --   AST 139*  --   --   GLUCOSE 103*   < > 109*   < > = values in this interval not displayed.   Lab Results  Component Value Date   CHOL 101 03/02/2023   HDL 33 (L) 03/02/2023   LDLCALC 61 03/02/2023   TRIG 35 03/02/2023   BNP (last 3 results) Recent Labs    02/26/23 0231 03/09/23 0429 03/15/23 0105  BNP 1,060.3* 480.8* 543.6*    ProBNP (last 3 results) Recent Labs    04/18/22 0958  PROBNP 587*     Diagnostic Studies/Procedures   Echo 02/27/23 Echo EF < 20% RV mildly reduced.   RHC 02/28/23 (Pre-Milrinone)  Successful right  heart catheterization via the right antecubital vein.   Normal RA pressure, mild pulmonary hypertension, mildly elevated wedge pressure and moderately reduced cardiac output/index.   RA: 5 mmHg RV 43/3 with an RVEDP of 8 mmHg PW: 26/37 with a mean of 19 mmHg.  Prominent V wave. PA: 44/13 with a mean of 27 mmHg. PA sat is 67% with cardiac output of 3.95 and cardiac index of 2.15.   2.  Severe right-sided pericardial calcifications of unclear etiology.  No evidence of constriction by hemodynamic evaluation.  RHC 03/06/23 (on Milrinone 0.125)  HEMODYNAMICS: RA:                  5 mmHg (mean) RV:                  38/5 mmHg PA:                  42/15 mmHg (24 mean) PCWP:            14 mmHg (mean)                                      Estimated Fick CO/CI   3.9 L/min, 2.1 L/min/m2 Thermodilution CO/CI   3.5 L/min, 1.9 L/min/m2                                              TPG                 10  mmHg                                            PVR                 6.2- 6.8 Wood Units  PAPi                >5       IMPRESSION: Normal pre and post capillary filling pressures.  Moderate to severely reduced cardiac output / index.  Elevated PVR  Cardiovascular Surgery 03/08/23 Procedure   Median Sternotomy Extracorporeal circulation 3.   Implantation of HeartMate 3 Left Ventricular Assist Device.    Discharge Medications   Allergies as of 03/20/2023   No Known Allergies      Medication List  STOP taking these medications    Entresto 24-26 MG Generic drug: sacubitril-valsartan   Farxiga 10 MG Tabs tablet Generic drug: dapagliflozin propanediol   metoprolol succinate 25 MG 24 hr tablet Commonly known as: TOPROL-XL   nitroGLYCERIN 0.4 MG SL tablet Commonly known as: NITROSTAT   potassium chloride SA 20 MEQ tablet Commonly known as: KLOR-CON M   Zenpep 40000-126000 units Cpep Generic drug: Pancrelipase (Lip-Prot-Amyl)       TAKE these medications     acetaminophen 500 MG tablet Commonly known as: TYLENOL Take 1,000 mg by mouth every 6 (six) hours as needed for mild pain.   amiodarone 200 MG tablet Commonly known as: PACERONE Take 1 tablet (200 mg total) by mouth 2 (two) times daily. What changed:  when to take this Another medication with the same name was removed. Continue taking this medication, and follow the directions you see here.   aspirin EC 81 MG tablet Take 1 tablet (81 mg total) by mouth daily. Swallow whole. Start taking on: March 21, 2023   atorvastatin 80 MG tablet Commonly known as: LIPITOR Take 1 tablet (80 mg total) by mouth daily.   bisacodyl 5 MG EC tablet Commonly known as: DULCOLAX Take 2 tablets (10 mg total) by mouth daily. Start taking on: March 21, 2023   cefTRIAXone 2 g in sodium chloride 0.9 % 100 mL Inject 2 g into the vein daily for 1 day.   docusate sodium 100 MG capsule Commonly known as: COLACE Take 2 capsules (200 mg total) by mouth daily. Start taking on: March 21, 2023   finasteride 5 MG tablet Commonly known as: PROSCAR Take 1 tablet (5 mg total) by mouth daily. Start taking on: March 21, 2023   insulin aspart 100 UNIT/ML injection Commonly known as: novoLOG Inject 0-24 Units into the skin 3 (three) times daily with meals.   losartan 25 MG tablet Commonly known as: COZAAR Take 1 tablet (25 mg total) by mouth daily. Start taking on: March 21, 2023   multivitamin tablet Take 1 tablet by mouth daily.   pantoprazole 40 MG tablet Commonly known as: PROTONIX Take 1 tablet (40 mg total) by mouth daily. Start taking on: March 21, 2023   spironolactone 25 MG tablet Commonly known as: ALDACTONE Take 0.5 tablets (12.5 mg total) by mouth daily. Start taking on: March 21, 2023 What changed:  when to take this additional instructions   tamsulosin 0.4 MG Caps capsule Commonly known as: FLOMAX Take 0.4 mg by mouth at bedtime.   torsemide 20 MG  tablet Commonly known as: DEMADEX Take 1 tablet (20 mg total) by mouth daily as needed. Take 1 tablet by mouth every other day. What changed:  how much to take how to take this when to take this reasons to take this   traMADol 50 MG tablet Commonly known as: ULTRAM Take 1-2 tablets (50-100 mg total) by mouth every 4 (four) hours as needed for moderate pain.   traZODone 50 MG tablet Commonly known as: DESYREL Take 1 tablet (50 mg total) by mouth at bedtime as needed for sleep.   Vitamin D 125 MCG (5000 UT) Caps Take 5,000 Units by mouth daily.   warfarin 1 MG tablet Commonly known as: COUMADIN Take as directed. If you are unsure how to take this medication, talk to your nurse or doctor. Original instructions: Take 1 tablet (1 mg total) by mouth one time only at 4 PM. What changed:  medication strength how much to take how  to take this when to take this additional instructions                 Disposition   The patient will be discharged in stable condition to CIR.            Duration of Discharge Encounter: Greater than 35 minutes   Signed, Robbie Lis, PA-C   03/20/2023, 11:50 AM  Agree with above. Transfer to CIR  Arvilla Meres, MD  2:54 PM

## 2023-03-20 ENCOUNTER — Inpatient Hospital Stay (HOSPITAL_COMMUNITY): Payer: 59

## 2023-03-20 ENCOUNTER — Other Ambulatory Visit: Payer: Self-pay

## 2023-03-20 ENCOUNTER — Inpatient Hospital Stay (HOSPITAL_COMMUNITY)
Admission: RE | Admit: 2023-03-20 | Discharge: 2023-03-27 | DRG: 949 | Disposition: A | Discharge status: Home Health | Payer: 59 | Source: Intra-hospital | Attending: Physical Medicine & Rehabilitation | Admitting: Physical Medicine & Rehabilitation

## 2023-03-20 ENCOUNTER — Encounter (HOSPITAL_COMMUNITY): Payer: Self-pay | Admitting: Physical Medicine & Rehabilitation

## 2023-03-20 DIAGNOSIS — Z955 Presence of coronary angioplasty implant and graft: Secondary | ICD-10-CM

## 2023-03-20 DIAGNOSIS — I252 Old myocardial infarction: Secondary | ICD-10-CM

## 2023-03-20 DIAGNOSIS — E785 Hyperlipidemia, unspecified: Secondary | ICD-10-CM | POA: Diagnosis present

## 2023-03-20 DIAGNOSIS — I255 Ischemic cardiomyopathy: Secondary | ICD-10-CM | POA: Diagnosis present

## 2023-03-20 DIAGNOSIS — R600 Localized edema: Secondary | ICD-10-CM | POA: Diagnosis present

## 2023-03-20 DIAGNOSIS — I5084 End stage heart failure: Secondary | ICD-10-CM | POA: Diagnosis present

## 2023-03-20 DIAGNOSIS — A499 Bacterial infection, unspecified: Secondary | ICD-10-CM

## 2023-03-20 DIAGNOSIS — I5042 Chronic combined systolic (congestive) and diastolic (congestive) heart failure: Secondary | ICD-10-CM | POA: Diagnosis present

## 2023-03-20 DIAGNOSIS — K921 Melena: Secondary | ICD-10-CM | POA: Diagnosis not present

## 2023-03-20 DIAGNOSIS — I5043 Acute on chronic combined systolic (congestive) and diastolic (congestive) heart failure: Secondary | ICD-10-CM | POA: Diagnosis not present

## 2023-03-20 DIAGNOSIS — Z8701 Personal history of pneumonia (recurrent): Secondary | ICD-10-CM

## 2023-03-20 DIAGNOSIS — R5381 Other malaise: Secondary | ICD-10-CM | POA: Diagnosis present

## 2023-03-20 DIAGNOSIS — I5023 Acute on chronic systolic (congestive) heart failure: Secondary | ICD-10-CM | POA: Diagnosis not present

## 2023-03-20 DIAGNOSIS — Z79899 Other long term (current) drug therapy: Secondary | ICD-10-CM | POA: Diagnosis not present

## 2023-03-20 DIAGNOSIS — Z7982 Long term (current) use of aspirin: Secondary | ICD-10-CM

## 2023-03-20 DIAGNOSIS — I272 Pulmonary hypertension, unspecified: Secondary | ICD-10-CM | POA: Diagnosis present

## 2023-03-20 DIAGNOSIS — G4700 Insomnia, unspecified: Secondary | ICD-10-CM | POA: Diagnosis present

## 2023-03-20 DIAGNOSIS — Z4509 Encounter for adjustment and management of other cardiac device: Secondary | ICD-10-CM

## 2023-03-20 DIAGNOSIS — Z8679 Personal history of other diseases of the circulatory system: Secondary | ICD-10-CM

## 2023-03-20 DIAGNOSIS — Z48812 Encounter for surgical aftercare following surgery on the circulatory system: Secondary | ICD-10-CM | POA: Diagnosis not present

## 2023-03-20 DIAGNOSIS — F4321 Adjustment disorder with depressed mood: Secondary | ICD-10-CM | POA: Diagnosis not present

## 2023-03-20 DIAGNOSIS — N183 Chronic kidney disease, stage 3 unspecified: Secondary | ICD-10-CM | POA: Diagnosis not present

## 2023-03-20 DIAGNOSIS — I319 Disease of pericardium, unspecified: Secondary | ICD-10-CM | POA: Diagnosis present

## 2023-03-20 DIAGNOSIS — I318 Other specified diseases of pericardium: Secondary | ICD-10-CM | POA: Diagnosis present

## 2023-03-20 DIAGNOSIS — N1831 Chronic kidney disease, stage 3a: Secondary | ICD-10-CM | POA: Diagnosis present

## 2023-03-20 DIAGNOSIS — I130 Hypertensive heart and chronic kidney disease with heart failure and stage 1 through stage 4 chronic kidney disease, or unspecified chronic kidney disease: Secondary | ICD-10-CM | POA: Diagnosis present

## 2023-03-20 DIAGNOSIS — I472 Ventricular tachycardia, unspecified: Secondary | ICD-10-CM | POA: Diagnosis present

## 2023-03-20 DIAGNOSIS — D72829 Elevated white blood cell count, unspecified: Secondary | ICD-10-CM | POA: Diagnosis not present

## 2023-03-20 DIAGNOSIS — Z7984 Long term (current) use of oral hypoglycemic drugs: Secondary | ICD-10-CM

## 2023-03-20 DIAGNOSIS — Z8546 Personal history of malignant neoplasm of prostate: Secondary | ICD-10-CM

## 2023-03-20 DIAGNOSIS — D62 Acute posthemorrhagic anemia: Secondary | ICD-10-CM

## 2023-03-20 DIAGNOSIS — I2510 Atherosclerotic heart disease of native coronary artery without angina pectoris: Secondary | ICD-10-CM | POA: Diagnosis present

## 2023-03-20 DIAGNOSIS — Z95811 Presence of heart assist device: Secondary | ICD-10-CM

## 2023-03-20 DIAGNOSIS — N179 Acute kidney failure, unspecified: Secondary | ICD-10-CM | POA: Diagnosis not present

## 2023-03-20 DIAGNOSIS — K922 Gastrointestinal hemorrhage, unspecified: Secondary | ICD-10-CM

## 2023-03-20 DIAGNOSIS — I4891 Unspecified atrial fibrillation: Secondary | ICD-10-CM | POA: Diagnosis present

## 2023-03-20 DIAGNOSIS — N390 Urinary tract infection, site not specified: Secondary | ICD-10-CM | POA: Diagnosis present

## 2023-03-20 DIAGNOSIS — K649 Unspecified hemorrhoids: Secondary | ICD-10-CM | POA: Diagnosis present

## 2023-03-20 DIAGNOSIS — Z87891 Personal history of nicotine dependence: Secondary | ICD-10-CM

## 2023-03-20 DIAGNOSIS — Z7901 Long term (current) use of anticoagulants: Secondary | ICD-10-CM

## 2023-03-20 DIAGNOSIS — R7303 Prediabetes: Secondary | ICD-10-CM | POA: Diagnosis present

## 2023-03-20 DIAGNOSIS — Z4502 Encounter for adjustment and management of automatic implantable cardiac defibrillator: Secondary | ICD-10-CM

## 2023-03-20 DIAGNOSIS — E871 Hypo-osmolality and hyponatremia: Secondary | ICD-10-CM

## 2023-03-20 DIAGNOSIS — R319 Hematuria, unspecified: Secondary | ICD-10-CM | POA: Diagnosis present

## 2023-03-20 DIAGNOSIS — I513 Intracardiac thrombosis, not elsewhere classified: Secondary | ICD-10-CM | POA: Diagnosis present

## 2023-03-20 DIAGNOSIS — I10 Essential (primary) hypertension: Secondary | ICD-10-CM | POA: Diagnosis present

## 2023-03-20 DIAGNOSIS — Z95810 Presence of automatic (implantable) cardiac defibrillator: Secondary | ICD-10-CM

## 2023-03-20 DIAGNOSIS — R7401 Elevation of levels of liver transaminase levels: Secondary | ICD-10-CM | POA: Diagnosis not present

## 2023-03-20 DIAGNOSIS — F4320 Adjustment disorder, unspecified: Secondary | ICD-10-CM | POA: Diagnosis present

## 2023-03-20 DIAGNOSIS — D6489 Other specified anemias: Secondary | ICD-10-CM | POA: Diagnosis present

## 2023-03-20 DIAGNOSIS — D75839 Thrombocytosis, unspecified: Secondary | ICD-10-CM | POA: Diagnosis present

## 2023-03-20 LAB — BASIC METABOLIC PANEL
Anion gap: 9 (ref 5–15)
BUN: 20 mg/dL (ref 8–23)
CO2: 21 mmol/L — ABNORMAL LOW (ref 22–32)
Calcium: 7.9 mg/dL — ABNORMAL LOW (ref 8.9–10.3)
Chloride: 98 mmol/L (ref 98–111)
Creatinine, Ser: 1.12 mg/dL (ref 0.61–1.24)
GFR, Estimated: >60 mL/min (ref >60)
Glucose, Bld: 109 mg/dL — ABNORMAL HIGH (ref 70–99)
Potassium: 4 mmol/L (ref 3.5–5.1)
Sodium: 128 mmol/L — ABNORMAL LOW (ref 135–145)

## 2023-03-20 LAB — GLUCOSE, CAPILLARY
Glucose-Capillary: 104 mg/dL — ABNORMAL HIGH (ref 70–99)
Glucose-Capillary: 157 mg/dL — ABNORMAL HIGH (ref 70–99)
Glucose-Capillary: 78 mg/dL (ref 70–99)
Glucose-Capillary: 164 mg/dL — ABNORMAL HIGH (ref 70–99)

## 2023-03-20 LAB — CBC
HCT: 28.5 % — ABNORMAL LOW (ref 39.0–52.0)
Hemoglobin: 9.2 g/dL — ABNORMAL LOW (ref 13.0–17.0)
MCH: 30.4 pg (ref 26.0–34.0)
MCHC: 32.3 g/dL (ref 30.0–36.0)
MCV: 94.1 fL (ref 80.0–100.0)
Platelets: 464 10*3/uL — ABNORMAL HIGH (ref 150–400)
RBC: 3.03 MIL/uL — ABNORMAL LOW (ref 4.22–5.81)
RDW: 15.1 % (ref 11.5–15.5)
WBC: 18 10*3/uL — ABNORMAL HIGH (ref 4.0–10.5)
nRBC: 0 % (ref 0.0–0.2)

## 2023-03-20 LAB — LACTATE DEHYDROGENASE: LDH: 310 U/L — ABNORMAL HIGH (ref 98–192)

## 2023-03-20 LAB — PROTIME-INR
INR: 2.6 — ABNORMAL HIGH (ref 0.8–1.2)
Prothrombin Time: 28.2 seconds — ABNORMAL HIGH (ref 11.4–15.2)

## 2023-03-20 LAB — MAGNESIUM: Magnesium: 2 mg/dL (ref 1.7–2.4)

## 2023-03-20 LAB — COOXEMETRY PANEL
Carboxyhemoglobin: 2.3 % — ABNORMAL HIGH (ref 0.5–1.5)
Methemoglobin: <0.7 % (ref 0.0–1.5)
O2 Saturation: 69.2 %
Total hemoglobin: 9.9 g/dL — ABNORMAL LOW (ref 12.0–16.0)

## 2023-03-20 MED ORDER — GUAIFENESIN 100 MG/5ML PO LIQD
5.0000 mL | ORAL | Status: DC | PRN
  Administered 2023-03-20 – 2023-03-23 (×6): 5 mL via ORAL
  Filled 2023-03-20 (×6): qty 5

## 2023-03-20 MED ORDER — TAMSULOSIN HCL 0.4 MG PO CAPS: 0.4000 mg | ORAL_CAPSULE | Freq: Every day | ORAL | Status: DC

## 2023-03-20 MED ORDER — FINASTERIDE 5 MG PO TABS: 5.0000 mg | ORAL_TABLET | Freq: Every day | ORAL | Status: DC

## 2023-03-20 MED ORDER — ATORVASTATIN CALCIUM 80 MG PO TABS
80.0000 mg | ORAL_TABLET | Freq: Every day | ORAL | Status: DC
  Administered 2023-03-21 – 2023-03-27 (×7): 80 mg via ORAL
  Filled 2023-03-20 (×7): qty 1

## 2023-03-20 MED ORDER — TAMSULOSIN HCL 0.4 MG PO CAPS
0.4000 mg | ORAL_CAPSULE | Freq: Every day | ORAL | Status: DC
  Administered 2023-03-20 – 2023-03-26 (×7): 0.4 mg via ORAL
  Filled 2023-03-20 (×7): qty 1

## 2023-03-20 MED ORDER — SACUBITRIL-VALSARTAN 24-26 MG PO TABS: 1.0000 | ORAL_TABLET | Freq: Two times a day (BID) | ORAL | Status: DC

## 2023-03-20 MED ORDER — AMIODARONE HCL 200 MG PO TABS: 200.0000 mg | ORAL_TABLET | Freq: Two times a day (BID) | ORAL | Status: DC

## 2023-03-20 MED ORDER — INSULIN ASPART 100 UNIT/ML IJ SOLN
0.0000 [IU] | Freq: Three times a day (TID) | INTRAMUSCULAR | Status: DC
  Administered 2023-03-21 – 2023-03-27 (×9): 2 [IU] via SUBCUTANEOUS

## 2023-03-20 MED ORDER — AMIODARONE HCL 200 MG PO TABS
200.0000 mg | ORAL_TABLET | Freq: Two times a day (BID) | ORAL | Status: DC
  Administered 2023-03-20 – 2023-03-27 (×14): 200 mg via ORAL
  Filled 2023-03-20 (×14): qty 1

## 2023-03-20 MED ORDER — BISACODYL 5 MG PO TBEC: 10.0000 mg | DELAYED_RELEASE_TABLET | Freq: Every day | ORAL | Status: DC

## 2023-03-20 MED ORDER — ENSURE ENLIVE PO LIQD
237.0000 mL | ORAL | Status: DC
  Administered 2023-03-21 – 2023-03-27 (×7): 237 mL via ORAL

## 2023-03-20 MED ORDER — TRAZODONE HCL 50 MG PO TABS: 50.0000 mg | ORAL_TABLET | Freq: Every evening | ORAL | Status: DC | PRN

## 2023-03-20 MED ORDER — ASPIRIN 81 MG PO TBEC
81.0000 mg | DELAYED_RELEASE_TABLET | Freq: Every day | ORAL | Status: DC
  Administered 2023-03-22 – 2023-03-25 (×4): 81 mg via ORAL
  Filled 2023-03-20 (×5): qty 1

## 2023-03-20 MED ORDER — TRAMADOL HCL 50 MG PO TABS: 50.0000 mg | ORAL_TABLET | ORAL | Status: DC | PRN

## 2023-03-20 MED ORDER — SODIUM CHLORIDE 0.9 % IV SOLN
2.0000 g | INTRAVENOUS | Status: AC
  Administered 2023-03-20: 2 g via INTRAVENOUS
  Filled 2023-03-20: qty 20

## 2023-03-20 MED ORDER — SPIRONOLACTONE 25 MG PO TABS: 12.5000 mg | ORAL_TABLET | Freq: Every day | ORAL | Status: DC

## 2023-03-20 MED ORDER — TRAZODONE HCL 50 MG PO TABS
50.0000 mg | ORAL_TABLET | Freq: Every evening | ORAL | Status: DC | PRN
  Administered 2023-03-20 – 2023-03-27 (×7): 50 mg via ORAL
  Filled 2023-03-20 (×7): qty 1

## 2023-03-20 MED ORDER — ADULT MULTIVITAMIN W/MINERALS CH
1.0000 | ORAL_TABLET | Freq: Every day | ORAL | Status: DC
  Administered 2023-03-21 – 2023-03-27 (×7): 1 via ORAL
  Filled 2023-03-20 (×7): qty 1

## 2023-03-20 MED ORDER — POLYETHYLENE GLYCOL 3350 17 G PO PACK
17.0000 g | PACK | Freq: Every day | ORAL | Status: DC
  Administered 2023-03-21 – 2023-03-27 (×7): 17 g via ORAL
  Filled 2023-03-20 (×7): qty 1

## 2023-03-20 MED ORDER — ASPIRIN 81 MG PO CHEW
81.0000 mg | CHEWABLE_TABLET | Freq: Every day | ORAL | Status: DC
  Administered 2023-03-21: 81 mg
  Filled 2023-03-20: qty 1

## 2023-03-20 MED ORDER — SPIRONOLACTONE 12.5 MG HALF TABLET
12.5000 mg | ORAL_TABLET | Freq: Every day | ORAL | Status: DC
  Administered 2023-03-21: 12.5 mg via ORAL
  Filled 2023-03-20: qty 1

## 2023-03-20 MED ORDER — BISACODYL 10 MG RE SUPP
10.0000 mg | Freq: Every day | RECTAL | Status: DC
  Filled 2023-03-20 (×2): qty 1

## 2023-03-20 MED ORDER — LOSARTAN POTASSIUM 25 MG PO TABS: 25.0000 mg | ORAL_TABLET | Freq: Every day | ORAL | Status: DC

## 2023-03-20 MED ORDER — TRAMADOL HCL 50 MG PO TABS
50.0000 mg | ORAL_TABLET | ORAL | Status: DC | PRN
  Administered 2023-03-23: 50 mg via ORAL
  Filled 2023-03-20 (×2): qty 1

## 2023-03-20 MED ORDER — WARFARIN SODIUM 1 MG PO TABS
1.0000 mg | ORAL_TABLET | Freq: Once | ORAL | Status: DC
  Filled 2023-03-20: qty 1

## 2023-03-20 MED ORDER — WARFARIN - PHARMACIST DOSING INPATIENT: Freq: Every day | Status: DC

## 2023-03-20 MED ORDER — OXYCODONE HCL 5 MG PO TABS: 5.0000 mg | ORAL_TABLET | ORAL | Status: DC | PRN

## 2023-03-20 MED ORDER — FINASTERIDE 5 MG PO TABS
5.0000 mg | ORAL_TABLET | Freq: Every day | ORAL | Status: DC
  Administered 2023-03-21 – 2023-03-27 (×7): 5 mg via ORAL
  Filled 2023-03-20 (×7): qty 1

## 2023-03-20 MED ORDER — LOSARTAN POTASSIUM 25 MG PO TABS
25.0000 mg | ORAL_TABLET | Freq: Every day | ORAL | Status: DC
  Administered 2023-03-21: 25 mg via ORAL
  Filled 2023-03-20: qty 1

## 2023-03-20 MED ORDER — SODIUM CHLORIDE 0.9 % IV SOLN: INTRAVENOUS | Status: DC | PRN

## 2023-03-20 MED ORDER — LOSARTAN POTASSIUM 25 MG PO TABS
25.0000 mg | ORAL_TABLET | Freq: Every day | ORAL | Status: DC
  Administered 2023-03-20: 25 mg via ORAL
  Filled 2023-03-20: qty 1

## 2023-03-20 MED ORDER — PANTOPRAZOLE SODIUM 40 MG PO TBEC: 40.0000 mg | DELAYED_RELEASE_TABLET | Freq: Every day | ORAL | Status: DC

## 2023-03-20 MED ORDER — CHLORHEXIDINE GLUCONATE CLOTH 2 % EX PADS: 6.0000 | MEDICATED_PAD | Freq: Every day | CUTANEOUS | Status: DC

## 2023-03-20 MED ORDER — WARFARIN SODIUM 1 MG PO TABS: 1.0000 mg | ORAL_TABLET | Freq: Once | ORAL | Status: DC

## 2023-03-20 MED ORDER — ASPIRIN 81 MG PO TBEC: 81.0000 mg | DELAYED_RELEASE_TABLET | Freq: Every day | ORAL | Status: DC

## 2023-03-20 MED ORDER — PANTOPRAZOLE SODIUM 40 MG PO TBEC
40.0000 mg | DELAYED_RELEASE_TABLET | Freq: Every day | ORAL | Status: DC
  Administered 2023-03-21 – 2023-03-27 (×7): 40 mg via ORAL
  Filled 2023-03-20 (×7): qty 1

## 2023-03-20 MED ORDER — INSULIN ASPART 100 UNIT/ML IJ SOLN: 0.0000 [IU] | Freq: Three times a day (TID) | INTRAMUSCULAR | Status: DC

## 2023-03-20 MED ORDER — BISACODYL 5 MG PO TBEC
10.0000 mg | DELAYED_RELEASE_TABLET | Freq: Every day | ORAL | Status: DC
  Administered 2023-03-21 – 2023-03-25 (×5): 10 mg via ORAL
  Filled 2023-03-20 (×5): qty 2

## 2023-03-20 MED ORDER — TORSEMIDE 20 MG PO TABS: 20.0000 mg | ORAL_TABLET | Freq: Every day | ORAL | Status: DC | PRN

## 2023-03-20 MED ORDER — DOCUSATE SODIUM 100 MG PO CAPS
200.0000 mg | ORAL_CAPSULE | Freq: Every day | ORAL | Status: DC
  Administered 2023-03-23 – 2023-03-26 (×4): 200 mg via ORAL
  Filled 2023-03-20 (×6): qty 2

## 2023-03-20 MED ORDER — DOCUSATE SODIUM 100 MG PO CAPS: 200.0000 mg | ORAL_CAPSULE | Freq: Every day | ORAL | Status: DC

## 2023-03-20 MED ORDER — SODIUM CHLORIDE 0.9 % IV SOLN: 2.0000 g | INTRAVENOUS | Status: DC

## 2023-03-20 MED ORDER — WARFARIN SODIUM 1 MG PO TABS
1.0000 mg | ORAL_TABLET | Freq: Once | ORAL | Status: AC
  Administered 2023-03-20: 1 mg via ORAL
  Filled 2023-03-20: qty 1

## 2023-03-20 MED ORDER — SODIUM CHLORIDE 0.9% FLUSH
10.0000 mL | Freq: Two times a day (BID) | INTRAVENOUS | Status: DC
  Administered 2023-03-20 – 2023-03-24 (×6): 10 mL
  Administered 2023-03-24: 30 mL
  Administered 2023-03-25 – 2023-03-26 (×3): 10 mL

## 2023-03-20 NOTE — Progress Notes (Signed)
LVAD Coordinator Rounding Note:  Admitted 02/26/23 due to CHF.   HM3 LVAD implanted on 03/08/23 by Dr Laneta Simmers under DT criteria.  9/12: HM3 LVAD Implant. Intra-op TEE LVEF 15%, RV normal  9/13: Extubated.  Driveline pulled back, 1 inch of Velour exposed.  9/14: Went into atrial fibrillation with RVR,  IV amiodarone started.  9/15: Hematuria.  9/17: Urology consulted for hematuria. Proscar added. Given 1 unit of blood. Ramp Echo speed increased to 5500.   Pt sitting up in bed watching TV. Denies complaints.   Plan for CIR once bed available  Insurance authorization obtained.  Discharge education completed with patient 03/19/23. See separate note for details. Will reach out to [name] to discuss plan for education/dressing change teaching.   Vital signs: Temp: 98.2 HR: 80 paced Doppler Pressure: 70 Auto BP: 85/68 (74) O2 Sat: 100% on RA Wt: 155.6>169.5>162>153.4>159.3>153.4>151.9>148.8>149.7 lbs    LVAD interrogation reveals:  Speed: 5500 Flow: 4.9 Power: 4.0 w  PI: 2.1  Alarms: none  Events:  >100 PI events Hematocrit: 29  Fixed speed: 5500 Low speed limit: 5200   Drive Line: Existing VAD dressing clean, dry, and intact. Drive line anchor correctly applied. Pt denies fever or chills. Continue Monday-Wednesday-Friday dressing changes. Next dressing change due 03/21/23 by nurse champion or VAD coordinator.     Labs:  LDH trend: 234>310>274>302>318>306>285>310  INR trend: 1.6>1.5>1.6>1.5>1.7>2.3>2.6  Anticoagulation Plan: -INR Goal: 2-2.5 -ASA Dose: 81 mg   Blood Products:  -Intra op: 03/08/23 1 PRBC 2 FFP 1 PLT 1 Cryo 450 cell saver  Post op: 03/08/23 1 PRBC 2 FFP 1 PLT 1 Cryo  03/09/23: 1 PRBC 03/13/23: 1 PRBC  Device: -Medtronic -Therapies: ON  -VF > 200BMP  Arrythmias: hx afib- currently paced 80  Respiratory: on RA  Infection:   Renal:  -CRT: 1.28>1.72>1.76>1.63>1.37>1.17>1.12  Patient Education: All discharge education completed at bedside  03/19/23 with pt. Please see separate note for details.  Adverse Events on VAD:  Plan/Recommendations:  1. Please page VAD coordinator with any equipment issues or driveline problems. 2. Continue MWF dressing changes using silver strip by VAD coordinator or nurse champion. Next dressing due 03/21/23.  Alyce Pagan RN VAD Coordinator  Office: [phone]  24/7 Pager: [phone]

## 2023-03-20 NOTE — Progress Notes (Addendum)
Patient ID: Steven [name], male   DOB: [birth year], 67 y.o.   MRN: [id number]   Advanced Heart Failure VAD Team Note  PCP-Cardiologist: Dr. Gala Romney   Subjective:    9/12: HM3 LVAD Implant. Intra-op TEE LVEF 15%, RV normal  9/13: Extubated.  Driveline pulled back, 1 inch of Velour exposed.  9/14: Went into atrial fibrillation with RVR,  IV amiodarone started.  9/15: Hematuria.  9/16: SR, amio cut back to 30 mg per hour 9/17: Urology consulted for hematuria. Proscar added. Given 1 unit of blood. Ramp Echo speed increased to 5500.   Co-ox 69%. CVP 4  C/w persistent leukocytosis but relatively stable. WBC 18K. Afebrile. + cough.  MAPs upper 60s overnight w/ several low PIs  LDH stable   INR 2.6 Hgb 9.2   Feels tired this morning and weak. Just woke up. Denies CP. No dyspnea.   LVAD INTERROGATION:  HeartMate III LVAD:   Flow 4.5  liters/min, speed 5500, power 4.0 PI 3.5. Numerous PI events this morning. VAD interrogated personally.   Objective:    Vital Signs:   Temp:  [97.6 F (36.4 C)-98.2 F (36.8 C)] 98.1 F (36.7 C) (09/24 0320) Pulse Rate:  [79-101] 80 (09/23 1621) Resp:  [16-21] 16 (09/24 0320) BP: (72-112)/(59-86) 112/83 (09/24 0320) SpO2:  [96 %-100 %] 100 % (09/24 0320) Weight:  [67.9 kg] 67.9 kg (09/24 0416) Last BM Date : 03/18/23 Mean arterial Pressure 92 Intake/Output:   Intake/Output Summary (Last 24 hours) at 03/20/2023 0721 Last data filed at 03/20/2023 0416 Gross per 24 hour  Intake 350 ml  Output 1150 ml  Net -800 ml     Physical Exam   CVP 4 General:  fatigued appearing, laying in bed. No distress    HEENT: normal  Neck: supple. JVP 5 cm.  Carotids 2+ bilat; no bruits. No lymphadenopathy or thryomegaly appreciated. Cor: LVAD hum.  Lungs: decreased BS at the bases bilaterally  Abdomen: soft, nontender, non-distended. No hepatosplenomegaly. No bruits or masses. Good bowel sounds. Driveline site clean. Anchor in place.  Extremities: no cyanosis,  clubbing, rash. Warm, + trace ankle edema  Neuro: alert & oriented x 3. No focal deficits. Moves all 4 without problem    Telemetry   A-paced 80s, personally reviewed   EKG    No new EKG to review   Labs   Basic Metabolic Panel: Recent Labs  Lab 03/14/23 0521 03/15/23 0105 03/15/23 1000 03/16/23 0602 03/17/23 0510 03/18/23 0435 03/19/23 0530 03/20/23 0530  NA 136  --    < > 135 132* 130* 131* 128*  K 3.9  --    < > 3.9 4.0 3.7 4.0 4.0  CL 96*  --    < > 100 98 93* 98 98  CO2 25  --    < > 25 25 25 25  21*  GLUCOSE 128*  --    < > 109* 103* 100* 99 109*  BUN 38*  --    < > 25* 25* 22 19 20   CREATININE 1.63*  --    < > 1.37* 1.44* 1.33* 1.17 1.12  CALCIUM 8.8*  --    < > 8.6* 8.6* 8.6* 8.2* 7.9*  MG 2.2 2.3  --   --   --   --  2.1 2.0  PHOS 3.1 3.2  --   --   --   --   --   --    < > = values in this interval not displayed.  Liver Function Tests: Recent Labs  Lab 03/14/23 0521 03/17/23 0510  AST 46* 139*  ALT 29 103*  ALKPHOS 68 70  BILITOT 1.9* 1.4*  PROT 6.5 6.3*  ALBUMIN 2.8* 2.7*   No results for input(s): "LIPASE", "AMYLASE" in the last 168 hours. No results for input(s): "AMMONIA" in the last 168 hours.  CBC: Recent Labs  Lab 03/14/23 0521 03/15/23 0105 03/16/23 0602 03/16/23 1500 03/17/23 0510 03/18/23 0435 03/19/23 0530 03/20/23 0548  WBC 12.8* 12.9*   < > 16.4* 16.8* 17.4* 17.5* 18.0*  NEUTROABS 9.6* 9.7*  --   --   --   --   --   --   HGB 9.0* 8.4*   < > 9.3* 8.8* 9.3* 9.2* 9.2*  HCT 26.2* 24.6*   < > 28.9* 27.7* 28.8* 28.9* 28.5*  MCV 91.3 93.2   < > 96.0 94.5 95.7 94.1 94.1  PLT 195 240   < > 355 346 426* 439* 464*   < > = values in this interval not displayed.    INR: Recent Labs  Lab 03/16/23 0602 03/17/23 0510 03/18/23 0435 03/19/23 0530 03/20/23 0530  INR 1.7* 2.1* 2.2* 2.3* 2.6*    Other results: EKG:    Imaging   No results found.   Medications:     Scheduled Medications:  amiodarone  200 mg Oral BID    aspirin EC  81 mg Oral Daily   Or   aspirin  81 mg Per Tube Daily   atorvastatin  80 mg Oral Daily   bisacodyl  10 mg Oral Daily   Or   bisacodyl  10 mg Rectal Daily   Chlorhexidine Gluconate Cloth  6 each Topical Daily   docusate sodium  200 mg Oral Daily   feeding supplement  237 mL Oral Q24H   finasteride  5 mg Oral Daily   insulin aspart  0-24 Units Subcutaneous TID WC   multivitamin with minerals  1 tablet Oral Daily   pantoprazole  40 mg Oral Daily   polyethylene glycol  17 g Oral Daily   sacubitril-valsartan  1 tablet Oral BID   sodium chloride flush  10-40 mL Intracatheter Q12H   sodium chloride flush  3 mL Intravenous Q12H   spironolactone  12.5 mg Oral Daily   tamsulosin  0.4 mg Oral QPC supper   Warfarin - Pharmacist Dosing Inpatient   Does not apply q1600    Infusions:  sodium chloride 20 mL/hr at 03/10/23 2100   sodium chloride     cefTRIAXone (ROCEPHIN)  IV Stopped (03/19/23 1649)   lactated ringers     lactated ringers Stopped (03/10/23 0729)    PRN Medications: sodium chloride, dextrose, guaiFENesin-dextromethorphan, morphine injection, ondansetron (ZOFRAN) IV, mouth rinse, oxyCODONE, sodium chloride flush, sodium chloride flush, traMADol, traZODone   Patient Profile   Steven [name] is a 67 y.o. 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    Admitted with acute on chronic systolic and diastolic CHF with low-output. Advanced Heart Failure consulted and workup started for advanced therapies. S/p HM3 LVAD 9/12.   Assessment/Plan:    1. Acute on chronic combined systolic and diastolic heart failure>>Low Output S/p HM3 LVAD on 9/12.  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 NYHA IV on admission.  Echo 02/27/23: EF <20%, LV  with GHK, RV mildly reduced, GIIDD, LA mod dilated,  mild MR.  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 GDMT limited by renal function and hypotension.  S/P HM3 LVAD implant 9/12.  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 Intra-op TEE LVEF 15%, RV normal.  - Ramp ECHO 03/13/23. Speed increased to 5500. Pain controlled with tylenol.  - CO-OX 69%  - MAPs upper 60s overnight w/ several low PIs. CVP 4. No torsemide today  - Stop Entresto. Switch back to losartan 25 mg daily  - Continue Spiro 12.5 mg daily  - Holding Bidil w/ soft BPs - LDH stable.  - INR 2.6 Discussed dosing with PharmD personally. - Continue 81 mg ASA 2. 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  -  no s/s angina - on ASA/statin  3. AKI on CKD stage 3: Creatinine baseline 1.6-1.8 - SCr stable 1.12 - follow w/ GDMT titration  4. Mural thrombus: Warfarin.  5. H/o VT s/p Medtronic ICD: likely scar mediated - Continue amiodarone  6. Anemia: Post-op.  9/17 1UPRBCs.  Hgb 7>8>9>8.4>>8.4>9.2>9.2   - hematuria resolving  7. Atrial fibrillation:  - A Paced.  - continue amio 200 mg twice a day   - On coumadin. INR 2.6 8. Hematuria:  - Urology consulted. Started on proscar.  - Suspect related to trauma with foley insertion.  - seems to be improved. Continue to follow.  9. ID: mTemp overnight 99.1 on 9/19. CXR and PCT ok. ?UTI. Started on ceftriaxone  - now AF but c/w persistent leukocytosis + cough. ? Atelectasis. Check CXR and Continue pulmonary toilet  10. Deconditioning: cont PT/OT. - CIR following. Insurance approved. Awaiting Bed.    I reviewed the LVAD parameters from today, and compared the results to the patient's prior recorded data.  No programming changes were made.  The LVAD is functioning within specified parameters.  The patient performs LVAD self-test daily.  LVAD interrogation was negative for any significant power changes, alarms or PI events/speed drops.  LVAD equipment check completed and is in good working order.  Back-up  equipment present.   LVAD education done on emergency procedures and precautions and reviewed exit site care.  Length of Stay: [address], PA-C 03/20/2023, 7:21 AM  VAD Team --- VAD ISSUES ONLY--- Pager [phone] (7am - 7am)  Advanced Heart Failure Team  Pager [phone] (M-F; 7a - 5p)  Please contact [facility] for night-coverage after hours (5p -7a ) and weekends on [email or web address]  Patient seen and examined with the above-signed Advanced Practice Provider and/or Housestaff. I personally reviewed laboratory data, imaging studies and relevant notes. I independently examined the patient and formulated the important aspects of the plan. I have edited the note to reflect any of my changes or salient points. I have personally discussed the plan with the patient and/or family.  Feeling better. No CP or SOB. Still a bit weak. Co-ox 69% CVP 4 INR 2.6  General:  NAD.  HEENT: normal  Neck: supple. JVP not elevated.  Carotids 2+ bilat; no bruits. No lymphadenopathy or thryomegaly appreciated. Cor: LVAD hum.  Lungs: Clear. Abdomen: soft, nontender, non-distended. No hepatosplenomegaly. No bruits or masses. Good bowel sounds. Driveline site clean. Anchor in place.  Extremities: no cyanosis, clubbing, rash. Warm no edema  Neuro: alert & oriented x 3. No focal deficits. Moves all 4 without problem  Overall stable. Ok for transfer to [facility].   VAD interrogated personally. Parameters stable. INR 2.6  HF team will follow.   Arvilla Meres, MD  2:53 PM

## 2023-03-20 NOTE — Progress Notes (Signed)
[name], MD  Physician Physical Medicine and Rehabilitation   PMR Pre-admission    Signed   Date of Service: 03/20/2023  9:57 AM  Related encounter: ED to Hosp-Admission (Current) from 02/26/2023 in [place] 2C CV PROGRESSIVE CARE   Signed     Expand All Collapse All  PMR Admission Coordinator Pre-Admission Assessment   Patient: Steven [name] is an 67 y.o., male MRN: [id number] DOB: 09/22/1955 Height: 5\' 9"  (175.3 cm) Weight: 68.9 kg   Insurance Information HMO:     PPO:     PCP:      IPA:      80/20:      OTHER:  PRIMARY: UHC Medicare      Policy#: [id number]      Subscriber: pt CM Name: Marilynne Halsted      Phone#: [phone]     Fax#: [phone] Pre-Cert#: [id number] auth for CIR from [place] with Home and [facility]  for admit 9/24 with updates due to fax listed above on 9/30      Employer:  Benefits:  Phone #: [phone]     Name:  Eff. Date: 06/26/22     Deduct: $240 (met)      Out of Pocket Max: [phone] (met [phone])      Life Max:  CIR: $1785/admit      SNF: 20 full days Outpatient: 80%     Co-Pay: 20% Home Health: 100%      Co-Pay:  DME: 80%     Co-Pay: 20% Providers:  SECONDARY: Medicaid of [place]       Policy#: [id number]      Phone#:    Financial Counselor:       Phone#:    The "Data Collection Information Summary" for patients in Inpatient Rehabilitation Facilities with attached "Privacy Act Statement-Health Care Records" was provided and verbally reviewed with: Patient and Family   Emergency Contact Information Contact Information       Name Relation Home Work Mobile    Czaplicki,Joria Daughter [phone]   [phone]         Other Contacts       Name Relation Home Work Mobile    [place] Other     [phone]           Current Medical History  Patient Admitting Diagnosis: new LVAD   History of Present Illness: [name] with history of ischemic cardiomyopathy/STEMI with ejection fraction of 20 to 25%, prediabetes, systolic  congestive heart failure, 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  Recent admission 4/21 to 10/18/2022 for heart failure exacerbation.  Presented 02/26/2023 with dyspnea.  Chest x-ray showed severe diffuse bilateral airspace disease, worse on the right and improving on the left.  Admission chemistries unremarkable except BNP 1060, potassium 8.3.  Echocardiogram with ejection fraction less than 20%.  Left ventricle demonstrating severe decreased function.  Cardiology follow-up for acute on chronic combined systolic and diastolic congestive heart failure.  Underwent cardiac catheterization showing normal RA pressure, mild pulmonary hypertension mildly elevated wedge pressure and moderately reduced cardiac output.  CT of the chest showed extensive calcification of his pericardium particularly over the right atrium and atrioventricular groove part of the right ventricle.  His pericardial space likely obliterated due to remote pericarditis.  He was started on milrinone as well as diuresis.  Evaluated by cardiothoracic surgery  for LVAD completed 03/08/2023 per Dr. Maren Beach.  He remained intubated through 03/09/2023.  Hospital course is chronic Coumadin has been resumed followed by heart failure team.  He did go into A-fib with RVR 9/14 IV amiodarone started and slowly weaned transition to p.o currently 200 mg twice daily.  Acute blood loss anemia 9.2 and monitored.  Urology was consulted 9/17 for hematuria he was placed on Proscar and hematuria has resolved his urinalysis is unremarkable persistent leukocytosis maintained on empiric Rocephin..  Creatinine remained stable 1.74-1.17.  Therapies initiated with sternal precautions.  Therapy evaluations completed and pt was recommended a comprehensive rehab program.    Patient's  medical record from [facility] has been reviewed by the rehabilitation admission coordinator and physician.   Past Medical History      Past Medical History:  Diagnosis Date   Acute MI, anterior wall (HCC)     AICD (automatic cardioverter/defibrillator) present     CAD (coronary artery disease)      2D ECHO, 07/13/2011 - EF <25%, LV moderatelty dilated, LA moderately dilatedLEXISCAN, 12/14/2011 - moderate-severe perfusion defect seen in the basal anteroseptal, mid anterior, apicacl anterior, apical, apical inferior, and apical lateral regions, post-stress EF 25%, new EKG changes from baseline abnormalities   Cancer [facility])      Prostate   CHF (congestive heart failure) (HCC) 2012   Hypertension 08/08/2021   Inguinal hernia, left     Pneumonia      November 2023   Pre-diabetes            Has the patient had major surgery during 100 days prior to admission? Yes   Family History   family history is not on file.   Current Medications  Current Medications    Current Facility-Administered Medications:    0.45 % sodium chloride infusion, , Intravenous, Continuous PRN, Clegg, Amy D, NP, Last Rate: 20 mL/hr at 03/10/23 2100, Infusion Verify at 03/10/23 2100   0.9 %  sodium chloride infusion, 250 mL, Intravenous, Continuous, Clegg, Amy D, NP   amiodarone (PACERONE) tablet 200 mg, 200 mg, Oral, BID, Clegg, Amy D, NP, 200 mg at 03/16/23 0955   aspirin EC tablet 81 mg, 81 mg, Oral, Daily, 81 mg at 03/16/23 0955 **OR** aspirin chewable tablet 81 mg, 81 mg, Per Tube, Daily **OR** aspirin suppository 150 mg, 150 mg, Rectal, Daily, Clegg, Amy D, NP   atorvastatin (LIPITOR) tablet 80 mg, 80 mg, Oral, Daily, Clegg, Amy D, NP, 80 mg at 03/16/23 0954   bisacodyl (DULCOLAX) EC tablet 10 mg, 10 mg, Oral, Daily, 10 mg at 03/16/23 0955 **OR** bisacodyl (DULCOLAX) suppository 10 mg, 10 mg, Rectal, Daily, Clegg, Amy D, NP   Chlorhexidine Gluconate Cloth 2 % PADS 6 each, 6 each, Topical, Daily, Clegg, Amy D,  NP, 6 each at 03/16/23 1004   dextrose 50 % solution 0-50 mL, 0-50 mL, Intravenous, PRN, Clegg, Amy D, NP   docusate sodium (COLACE) capsule 200 mg, 200 mg, Oral, Daily, Clegg, Amy D, NP, 200 mg at 03/16/23 0954   feeding supplement (ENSURE ENLIVE / ENSURE PLUS) liquid 237 mL, 237 mL, Oral, Q24H, Laurey Morale, MD, 237 mL at 03/16/23 1432   finasteride (PROSCAR) tablet 5 mg, 5 mg, Oral, Daily, Clegg, Amy D, NP, 5 mg at 03/16/23 0954   insulin aspart (novoLOG) injection 0-24 Units, 0-24 Units, Subcutaneous, TID WC, Clegg, Amy D, NP, 2 Units at 03/16/23 1147   isosorbide-hydrALAZINE (BIDIL) 20-37.5 MG per tablet 1 tablet, 1 tablet, Oral,  TID, Filbert Schilder, Amy D, NP, 1 tablet at 03/16/23 0955   lactated ringers infusion, , Intravenous, Continuous, Clegg, Amy D, NP   lactated ringers infusion, , Intravenous, Continuous, Clegg, Amy D, NP, Stopped at 03/10/23 0729   [START ON 03/17/2023] losartan (COZAAR) tablet 25 mg, 25 mg, Oral, Daily, Laurey Morale, MD   morphine (PF) 2 MG/ML injection 1-4 mg, 1-4 mg, Intravenous, Q1H PRN, Clegg, Amy D, NP, 2 mg at 03/11/23 1046   multivitamin with minerals tablet 1 tablet, 1 tablet, Oral, Daily, Clegg, Amy D, NP, 1 tablet at 03/16/23 0955   ondansetron (ZOFRAN) injection 4 mg, 4 mg, Intravenous, Q6H PRN, Clegg, Amy D, NP   Oral care mouth rinse, 15 mL, Mouth Rinse, PRN, Clegg, Amy D, NP   oxyCODONE (Oxy IR/ROXICODONE) immediate release tablet 5-10 mg, 5-10 mg, Oral, Q3H PRN, Clegg, Amy D, NP, 5 mg at 03/11/23 0258   pantoprazole (PROTONIX) EC tablet 40 mg, 40 mg, Oral, Daily, Clegg, Amy D, NP, 40 mg at 03/16/23 0955   polyethylene glycol (MIRALAX / GLYCOLAX) packet 17 g, 17 g, Oral, Daily, Clegg, Amy D, NP, 17 g at 03/16/23 0955   sodium chloride flush (NS) 0.9 % injection 10-40 mL, 10-40 mL, Intracatheter, Q12H, Clegg, Amy D, NP, 10 mL at 03/16/23 1004   sodium chloride flush (NS) 0.9 % injection 10-40 mL, 10-40 mL, Intracatheter, PRN, Clegg, Amy D, NP   sodium  chloride flush (NS) 0.9 % injection 3 mL, 3 mL, Intravenous, Q12H, Clegg, Amy D, NP, 3 mL at 03/16/23 1004   sodium chloride flush (NS) 0.9 % injection 3 mL, 3 mL, Intravenous, PRN, Clegg, Amy D, NP   spironolactone (ALDACTONE) tablet 12.5 mg, 12.5 mg, Oral, Daily, Simmons, Brittainy M, PA-C, 12.5 mg at 03/16/23 0955   tamsulosin (FLOMAX) capsule 0.4 mg, 0.4 mg, Oral, QPC supper, Clegg, Amy D, NP, 0.4 mg at 03/15/23 1708   torsemide (DEMADEX) tablet 20 mg, 20 mg, Oral, Daily, Clegg, Amy D, NP, 20 mg at 03/16/23 0955   traMADol (ULTRAM) tablet 50-100 mg, 50-100 mg, Oral, Q4H PRN, Clegg, Amy D, NP, 50 mg at 03/11/23 0035   traZODone (DESYREL) tablet 50 mg, 50 mg, Oral, QHS PRN, Clegg, Amy D, NP, 50 mg at 03/15/23 2033   warfarin (COUMADIN) tablet 4 mg, 4 mg, Oral, ONCE-1600, Allayne Butcher, PA-C   Warfarin - Pharmacist Dosing Inpatient, , Does not apply, q1600, Laurey Morale, MD, Given at 03/15/23 1704     Patients Current Diet:  Diet Order                  Diet regular Room service appropriate? Yes; Fluid consistency: Thin  Diet effective now                         Precautions / Restrictions Precautions Precautions: Sternal, Other (comment) (LVAD) Precaution Comments: blood in urine Restrictions Weight Bearing Restrictions: Yes (sternal precautions) RUE Weight Bearing: Non weight bearing LUE Weight Bearing: Non weight bearing Other Position/Activity Restrictions: sternal prec    Has the patient had 2 or more falls or a fall with injury in the past year? No   Prior Activity Level Limited Community (1-2x/wk): indep prior to admit, no DME, driving   Prior Functional Level Self Care: Did the patient need help bathing, dressing, using the toilet or eating? Independent   Indoor Mobility: Did the patient need assistance with walking from room to room (with or without  device)? Independent   Stairs: Did the patient need assistance with internal or external stairs (with or  without device)? Independent   Functional Cognition: Did the patient need help planning regular tasks such as shopping or remembering to take medications? Independent   Patient Information Are you of Hispanic, Latino/a,or Spanish origin?: A. No, not of Hispanic, Latino/a, or Spanish origin What is your race?: B. Black or African American Do you need or want an interpreter to communicate with a doctor or health care staff?: 0. No   Patient's Response To:  Health Literacy and Transportation Is the patient able to respond to health literacy and transportation needs?: Yes Health Literacy - How often do you need to have someone help you when you read instructions, pamphlets, or other written material from your doctor or pharmacy?: Never In the past 12 months, has lack of transportation kept you from medical appointments or from getting medications?: No In the past 12 months, has lack of transportation kept you from meetings, work, or from getting things needed for daily living?: No   Journalist, newspaper / Equipment Home Assistive Devices/Equipment: None Home Equipment: None   Prior Device Use: Indicate devices/aids used by the patient prior to current illness, exacerbation or injury? None of the above   Current Functional Level Cognition   Overall Cognitive Status: Within Functional Limits for tasks assessed Orientation Level: Oriented X4 General Comments: difficulty problem solving switching LVAD from wall unit to batteries    Extremity Assessment (includes Sensation/Coordination)   Upper Extremity Assessment: Generalized weakness (within limits of sternal precations)  Lower Extremity Assessment: Defer to PT evaluation     ADLs   Overall ADL's : Needs assistance/impaired Eating/Feeding: Set up, Sitting Eating/Feeding Details (indicate cue type and reason): at EOB Grooming: Wash/dry hands, Wash/dry face, Oral care, Set up, Sitting Grooming Details (indicate cue type and reason):  at EOB Upper Body Bathing: Moderate assistance Lower Body Bathing: Maximal assistance Upper Body Dressing : Minimal assistance, Sitting, Cueing for compensatory techniques Upper Body Dressing Details (indicate cue type and reason): at EOB Lower Body Dressing: Minimal assistance, Cueing for compensatory techniques, Sitting/lateral leans Lower Body Dressing Details (indicate cue type and reason): at EOB Toilet Transfer: Minimal assistance, BSC/3in1, Rolling walker (2 wheels), Cueing for sequencing (step-pivot) Toilet Transfer Details (indicate cue type and reason): simulated to chair Toileting- Clothing Manipulation and Hygiene: Maximal assistance Functional mobility during ADLs: Minimal assistance General ADL Comments: Pt requiring mod cues to follow sternal precautions and for compensatory techniques during dressing tasks and sit/stand transfers.     Mobility   Overal bed mobility: Needs Assistance Bed Mobility: Sit to Supine Sidelying to sit: Mod assist Supine to sit: Min assist Sit to supine: Min assist General bed mobility comments: minA for trunk elevation cues for technique     Transfers   Overall transfer level: Needs assistance Equipment used: Rolling walker (2 wheels) Transfers: Sit to/from Stand, Bed to chair/wheelchair/BSC Sit to Stand: Min assist Bed to/from chair/wheelchair/BSC transfer type:: Step pivot Step pivot transfers: Min assist General transfer comment: use of heart pillow, minA to initiated rocking momentum and anterior weight shift to power up through legs.  Pt was able to translate forward  better with each standing trial.     Ambulation / Gait / Stairs / Wheelchair Mobility   Ambulation/Gait Ambulation/Gait assistance: Editor, commissioning (Feet): 300 Feet Assistive device: Rolling walker (2 wheels) Gait Pattern/deviations: Step-through pattern, Decreased stride length, Trunk flexed General Gait Details: minA for walker management.  decreased cadence,  verbal cues to look forward, verbal cues to stay in walker during turns  Sats maintained at 97% overall. Gait velocity: dec Gait velocity interpretation: <1.31 ft/sec, indicative of household ambulator     Posture / Balance Dynamic Sitting Balance Sitting balance - Comments: no external support seated EOB Balance Overall balance assessment: Needs assistance Sitting-balance support: No upper extremity supported, Feet supported Sitting balance-Leahy Scale: Good Sitting balance - Comments: no external support seated EOB Standing balance support: During functional activity, Reliant on assistive device for balance Standing balance-Leahy Scale: Poor Standing balance comment: reliant on external support     Special needs/care consideration LVAD    Previous Home Environment (from acute therapy documentation) Living Arrangements: Alone Available Help at Discharge: Other (Comment) Type of Home: House Home Layout: Two level, Bed/bath upstairs Alternate Level Stairs-Rails: Right Alternate Level Stairs-Number of Steps: flight Home Access: Level entry Bathroom Shower/Tub: Engineer, manufacturing systems: Standard Home Care Services: No   Discharge Living Setting Plans for Discharge Living Setting: Patient's home (ex-spouse Suzette Battiest) to stay with him) Type of Home at Discharge: House Discharge Home Layout: Bed/bath upstairs Discharge Home Access: Level entry Discharge Bathroom Shower/Tub: Tub/shower unit Discharge Bathroom Toilet: Standard Discharge Bathroom Accessibility: Yes How Accessible: Accessible via walker Does the patient have any problems obtaining your medications?: No   Social/Family/Support Systems Anticipated Caregiver: ex-wife, Suzette Battiest Anticipated Industrial/product designer Information: [phone] (have to leave a message with your phone numer or text her your number to get in touch with her) Ability/Limitations of Caregiver: none stated Caregiver Availability:  24/7 Discharge Plan Discussed with Primary Caregiver: Yes Is Caregiver In Agreement with Plan?: Yes Does Caregiver/Family have Issues with Lodging/Transportation while Pt is in Rehab?: No   Goals Patient/Family Goal for Rehab: PT/OT mod I, SLP n/a Expected length of stay: 7-10 days Additional Information: new LVAD, discharge home to pt's house with ex-wife Suzette Battiest) providing supervision at discharge Pt/Family Agrees to Admission and willing to participate: Yes Program Orientation Provided & Reviewed with Pt/Caregiver Including Roles  & Responsibilities: Yes  Barriers to Discharge: Insurance for SNF coverage   Decrease burden of Care through IP rehab admission: n/a    Possible need for SNF placement upon discharge: Not anticipated.  Plan for discharge to patient's home at mod I level, ex wife Suzette Battiest) to provide supervision initially for LVAD    Patient Condition: I have reviewed medical records from [facility], spoken with CM, and patient and family member. I met with patient at the bedside and discussed via phone for inpatient rehabilitation assessment.  Patient will benefit from ongoing PT and OT, can actively participate in 3 hours of therapy a day 5 days of the week, and can make measurable gains during the admission.  Patient will also benefit from the coordinated team approach during an Inpatient Acute Rehabilitation admission.  The patient will receive intensive therapy as well as Rehabilitation physician, nursing, social worker, and care management interventions.  Due to safety, skin/wound care, disease management, medication administration, pain management, and patient education the patient requires 24 hour a day rehabilitation nursing.  The patient is currently min with mobility and basic ADLs.  Discharge setting and therapy post discharge at home with home health is anticipated.  Patient has agreed to participate in the Acute Inpatient Rehabilitation Program and will admit today.    Preadmission Screen Completed By:  Stephania Fragmin, PT, DPT 03/16/2023 4:25 PM ______________________________________________________________________   Discussed status with Dr. Riley Kill on 03/20/23  at 9:57 AM  and received approval for admission today.   Admission Coordinator:  Stephania Fragmin, PT, DPT time 9:57 AM Dorna Bloom 03/20/23     Assessment/Plan: Diagnosis: debility related to heart failure/LVAD placement Does the need for close, 24 hr/day Medical supervision in concert with the patient's rehab needs make it unreasonable for this patient to be served in a less intensive setting? Yes Co-Morbidities requiring supervision/potential complications: ICM, CKD iii, prostate cancer Due to bladder management, bowel management, safety, skin/wound care, disease management, medication administration, pain management, and patient education, does the patient require 24 hr/day rehab nursing? Yes Does the patient require coordinated care of a physician, rehab nurse, PT, OT to address physical and functional deficits in the context of the above medical diagnosis(es)? Yes Addressing deficits in the following areas: balance, endurance, locomotion, strength, transferring, bowel/bladder control, bathing, dressing, feeding, grooming, toileting, and psychosocial support Can the patient actively participate in an intensive therapy program of at least 3 hrs of therapy 5 days a week? Yes The potential for patient to make measurable gains while on inpatient rehab is excellent Anticipated functional outcomes upon discharge from inpatient rehab: modified independent PT, modified independent OT, n/a SLP Estimated rehab length of stay to reach the above functional goals is: 7-10 days Anticipated discharge destination: Home 10. Overall Rehab/Functional Prognosis: excellent     MD Signature: [name], MD, [facility] Physical Medicine & Rehabilitation Medical Director Rehabilitation Services 03/20/2023           Revision History

## 2023-03-20 NOTE — Progress Notes (Signed)
Patient ID: Steven [name], male   DOB: [birth year], 67 y.o.   MRN: [id number]  INPATIENT REHABILITATION ADMISSION NOTE   Arrival Method: wheelchair      Mental Orientation: x4   Assessment: completed   Skin: CDI, LVAD dressing to abdomin, CDI    IV'S: Right PICC   Pain: None reported   Tubes and Drains: LVAD   Safety Measures: in place   Vital Signs: see flowsheet   Height and Weight: see flowsheet   Rehab Orientation: completed   Family: notified by patient

## 2023-03-20 NOTE — Progress Notes (Signed)
Inpatient Rehab Admissions Coordinator:   I have a bed available for this patient to admit to CIR today.  Dr. Gala Romney in agreement.  Will let pt/family and TOC team know.   Estill Dooms, PT, DPT Admissions Coordinator [phone] 03/20/23  9:43 AM

## 2023-03-20 NOTE — TOC Progression Note (Signed)
Transition of Care [facility]) - Progression Note    Patient Details  Name: Steven [name] MRN: [id number] Date of Birth: 1955-07-08  Transition of Care [facility]) CM/SW Contact  Leone Haven, RN Phone Number: 03/20/2023, 11:50 AM  Clinical Narrative:    Patient is for dc to CIR today.   Expected Discharge Plan: Home/Self Care Barriers to Discharge: No Barriers Identified  Expected Discharge Plan and Services   Discharge Planning Services: CM Consult Post Acute Care Choice: IP Rehab Living arrangements for the past 2 months: Single Family Home Expected Discharge Date: 03/20/23                                     Social Determinants of Health (SDOH) Interventions SDOH Screenings   Food Insecurity: No Food Insecurity (02/26/2023)  Housing: High Risk (02/26/2023)  Transportation Needs: No Transportation Needs (02/26/2023)  Utilities: Not At Risk (02/26/2023)  Alcohol Screen: Low Risk  (05/08/2022)  Financial Resource Strain: Low Risk  (05/08/2022)  Social Connections: Unknown (11/08/2021)   Received from [facility]  Tobacco Use: Medium Risk (03/08/2023)    Readmission Risk Interventions     No data to display

## 2023-03-20 NOTE — Progress Notes (Signed)
ANTICOAGULATION CONSULT NOTE - Follow-up  Pharmacy Consult for warfarin management Indication:  LVAD  No Known Allergies  Patient Measurements: Height: 5\' 9"  (175.3 cm) Weight: 67.9 kg (149 lb 11.1 oz) IBW/kg (Calculated) : 70.7 Heparin Dosing Weight: 73.5 kg  Vital Signs: Temp: 98.2 F (36.8 C) (09/24 0831) Temp Source: Oral (09/24 0831) BP: 85/68 (09/24 0831) Pulse Rate: 80 (09/24 0831)  Labs: Recent Labs    03/18/23 0435 03/19/23 0530 03/20/23 0530 03/20/23 0548  HGB 9.3* 9.2*  --  9.2*  HCT 28.8* 28.9*  --  28.5*  PLT 426* 439*  --  464*  LABPROT 24.6* 25.8* 28.2*  --   INR 2.2* 2.3* 2.6*  --   CREATININE 1.33* 1.17 1.12  --     Estimated Creatinine Clearance: 60.6 mL/min (by C-G formula based on SCr of 1.12 mg/dL).   Medical History: Past Medical History:  Diagnosis Date   Acute MI, anterior wall (HCC)    AICD (automatic cardioverter/defibrillator) present    CAD (coronary artery disease)    2D ECHO, 07/13/2011 - EF <25%, LV moderatelty dilated, LA moderately dilatedLEXISCAN, 12/14/2011 - moderate-severe perfusion defect seen in the basal anteroseptal, mid anterior, apicacl anterior, apical, apical inferior, and apical lateral regions, post-stress EF 25%, new EKG changes from baseline abnormalities   Cancer (HCC)    Prostate   CHF (congestive heart failure) (HCC) 2012   Hypertension 08/08/2021   Inguinal hernia, left    Pneumonia    November 2023   Pre-diabetes     Medications:  Medications Prior to Admission  Medication Sig Dispense Refill Last Dose   acetaminophen (TYLENOL) 500 MG tablet Take 1,000 mg by mouth every 6 (six) hours as needed for mild pain.   Past Week   amiodarone (PACERONE) 200 MG tablet Take 2 tablets (400 mg total) by mouth 2 (two) times daily for 14 days, THEN 1 tablet (200 mg total) 2 (two) times daily for 14 days. 84 tablet 0 02/25/2023 at am   atorvastatin (LIPITOR) 80 MG tablet Take 1 tablet (80 mg total) by mouth daily. 30 tablet  3 02/25/2023 at am   Cholecalciferol (VITAMIN D) 125 MCG (5000 UT) CAPS Take 5,000 Units by mouth daily.   02/25/2023 at am   dapagliflozin propanediol (FARXIGA) 10 MG TABS tablet Take 1 tablet (10 mg total) by mouth daily. 30 tablet 3 02/25/2023 at am   metoprolol succinate (TOPROL-XL) 25 MG 24 hr tablet Take 1 tablet (25 mg total) by mouth daily. 30 tablet 2 02/25/2023 at am   Multiple Vitamin (MULTIVITAMIN) tablet Take 1 tablet by mouth daily.   02/25/2023 at am   nitroGLYCERIN (NITROSTAT) 0.4 MG SL tablet Place 1 tablet (0.4 mg total) under the tongue every 5 (five) minutes as needed. Usual dose is q 5 minutes x 3 doses. 75 tablet 0 02/26/2023   potassium chloride SA (KLOR-CON M) 20 MEQ tablet Take 1 tablet (20 mEq total) by mouth daily. 30 tablet 2 02/25/2023 at am   sacubitril-valsartan (ENTRESTO) 24-26 MG Take 1 tablet by mouth 2 (two) times daily. 60 tablet 3 02/25/2023 at am   spironolactone (ALDACTONE) 25 MG tablet Take 0.5 tablets (12.5 mg total) by mouth every other day. TAKE EOD ON EVEN DAYS 15 tablet 6 02/24/2023 at am   tamsulosin (FLOMAX) 0.4 MG CAPS capsule Take 0.4 mg by mouth in the morning and at bedtime.   02/25/2023 at am   torsemide (DEMADEX) 20 MG tablet Take 1 tablet by mouth every  other day. 45 tablet 3 02/24/2023 at am   warfarin (COUMADIN) 5 MG tablet Take 1 tablet daily except 1/2 tablet on Wednesdays and Fridays or as directed. (Patient taking differently: Take 2.5-5 mg by mouth See admin instructions. Take 1 tablet by mouth every Tue and Thur, then take 1/2 tablet all other days) 90 tablet 1 02/25/2023 at 1230   ZENPEP 40000-126000 units CPEP Take 1 capsule by mouth 3 (three) times daily with meals.   02/25/2023 at pm   amiodarone (PACERONE) 200 MG tablet Take 1 tablet (200 mg) by mouth daily. 30 tablet 5    Scheduled:   amiodarone  200 mg Oral BID   aspirin EC  81 mg Oral Daily   Or   aspirin  81 mg Per Tube Daily   atorvastatin  80 mg Oral Daily   bisacodyl  10 mg Oral Daily   Or    bisacodyl  10 mg Rectal Daily   Chlorhexidine Gluconate Cloth  6 each Topical Daily   docusate sodium  200 mg Oral Daily   feeding supplement  237 mL Oral Q24H   finasteride  5 mg Oral Daily   insulin aspart  0-24 Units Subcutaneous TID WC   losartan  25 mg Oral Daily   multivitamin with minerals  1 tablet Oral Daily   pantoprazole  40 mg Oral Daily   polyethylene glycol  17 g Oral Daily   sodium chloride flush  10-40 mL Intracatheter Q12H   sodium chloride flush  3 mL Intravenous Q12H   spironolactone  12.5 mg Oral Daily   tamsulosin  0.4 mg Oral QPC supper   warfarin  1 mg Oral ONCE-1600   Warfarin - Pharmacist Dosing Inpatient   Does not apply q1600   Infusions:   sodium chloride 20 mL/hr at 03/10/23 2100   sodium chloride     cefTRIAXone (ROCEPHIN)  IV Stopped (03/19/23 1649)   lactated ringers     lactated ringers Stopped (03/10/23 0729)   PRN: sodium chloride, dextrose, guaiFENesin-dextromethorphan, morphine injection, ondansetron (ZOFRAN) IV, mouth rinse, oxyCODONE, sodium chloride flush, sodium chloride flush, traMADol, traZODone  Assessment: Pt presented to [facility] ED on 02/26/2023 with shortness of breath and SpO2 of 78 on room air. Pt has PMH of ICM (LAD infarct), HFrEF (EF <20%), LVT, VT s/p ICD, HTN, CKD3, HLD Pt underwent HMIII implantation on 9/12. Warfarin was initiated 9/15.   INR today is supratherapeutic at 2.6, patient is eating well. CBC and LDH stable.  Goal of Therapy:  2.0-2.5 Monitor platelets by anticoagulation protocol: Yes   Plan:  Warfarin 1 mg PO x1 Daily INR, CBC, LDH  Wilmer Floor, PharmD PGY2 Cardiology Pharmacy Resident Please check AMION for all [facility] Pharmacy numbers 03/20/2023

## 2023-03-20 NOTE — Plan of Care (Signed)
Problem: Education: Goal: Knowledge of General Education information will improve Description: Including pain rating scale, medication(s)/side effects and non-pharmacologic comfort measures Outcome: Adequate for Discharge   Problem: Health Behavior/Discharge Planning: Goal: Ability to manage health-related needs will improve Outcome: Adequate for Discharge   Problem: Clinical Measurements: Goal: Ability to maintain clinical measurements within normal limits will improve Outcome: Adequate for Discharge Goal: Will remain free from infection Outcome: Adequate for Discharge Goal: Diagnostic test results will improve Outcome: Adequate for Discharge Goal: Respiratory complications will improve Outcome: Adequate for Discharge Goal: Cardiovascular complication will be avoided Outcome: Adequate for Discharge   Problem: Activity: Goal: Risk for activity intolerance will decrease Outcome: Adequate for Discharge   Problem: Nutrition: Goal: Adequate nutrition will be maintained Outcome: Adequate for Discharge   Problem: Coping: Goal: Level of anxiety will decrease Outcome: Adequate for Discharge   Problem: Elimination: Goal: Will not experience complications related to bowel motility Outcome: Adequate for Discharge Goal: Will not experience complications related to urinary retention Outcome: Adequate for Discharge   Problem: Pain Managment: Goal: General experience of comfort will improve Outcome: Adequate for Discharge   Problem: Safety: Goal: Ability to remain free from injury will improve Outcome: Adequate for Discharge   Problem: Skin Integrity: Goal: Risk for impaired skin integrity will decrease Outcome: Adequate for Discharge   Problem: Education: Goal: Understanding of CV disease, CV risk reduction, and recovery process will improve Outcome: Adequate for Discharge Goal: Individualized Educational Video(s) Outcome: Adequate for Discharge   Problem:  Activity: Goal: Ability to return to baseline activity level will improve Outcome: Adequate for Discharge   Problem: Cardiovascular: Goal: Ability to achieve and maintain adequate cardiovascular perfusion will improve Outcome: Adequate for Discharge Goal: Vascular access site(s) Level 0-1 will be maintained Outcome: Adequate for Discharge   Problem: Health Behavior/Discharge Planning: Goal: Ability to safely manage health-related needs after discharge will improve Outcome: Adequate for Discharge   Problem: Education: Goal: Understanding of CV disease, CV risk reduction, and recovery process will improve Outcome: Adequate for Discharge Goal: Individualized Educational Video(s) Outcome: Adequate for Discharge   Problem: Activity: Goal: Ability to return to baseline activity level will improve Outcome: Adequate for Discharge   Problem: Cardiovascular: Goal: Ability to achieve and maintain adequate cardiovascular perfusion will improve Outcome: Adequate for Discharge Goal: Vascular access site(s) Level 0-1 will be maintained Outcome: Adequate for Discharge   Problem: Health Behavior/Discharge Planning: Goal: Ability to safely manage health-related needs after discharge will improve Outcome: Adequate for Discharge

## 2023-03-20 NOTE — Progress Notes (Signed)
CARDIAC REHAB PHASE I     Discharge to CIR today. Post op [facility] education completed. Referral for CRP2 sent to [facility].   Woodroe Chen, RN BSN 03/20/2023 1:14 PM

## 2023-03-20 NOTE — Progress Notes (Signed)
Occupational Therapy Treatment Patient Details Name: Steven [name] MRN: [id number] DOB: 07/14/1955 Today's Date: 03/20/2023   History of present illness Pt is 67 yo male admitted on 02/26/23 for acute on chronic systolic and diastolic CHF with low output.  S/P implantation of LVAD Heartmate 3  (9/12). PMHx includes but not limited to ICM, CAD, VT s/p medtronic ICD, HLD, apical mural thrombus, CKD, subdural hematoma.   OT comments  Pt progressing towards goals this session, needing CGA-mod A for ADLs, CGA for transfers and hallway distance ambulation with RW. Pt able to switch self from wall <> battery power with minA, able to identify items needed and items to take when transitioning to battery power. Pt able to complete standing grooming task at sink and hallway distance ambulation this session. Pt presenting with impairments listed below, will follow acutely. Patient will benefit from intensive inpatient follow up therapy, >3 hours/day to maximize safety/ind with ADLs/functional mobility.       If plan is discharge home, recommend the following:  A little help with walking and/or transfers;A lot of help with bathing/dressing/bathroom;Assistance with cooking/housework;Assist for transportation;Help with stairs or ramp for entrance   Equipment Recommendations  Other (comment) (defer)    Recommendations for Other Services Rehab consult    Precautions / Restrictions Precautions Precautions: Sternal;Other (comment) Restrictions Other Position/Activity Restrictions: sternal prec       Mobility Bed Mobility               General bed mobility comments: pt received up in chair and returned to chair    Transfers Overall transfer level: Needs assistance Equipment used: Rolling walker (2 wheels) Transfers: Sit to/from Stand, Bed to chair/wheelchair/BSC Sit to Stand: Contact guard assist     Step pivot transfers: Contact guard assist           Balance Overall balance  assessment: Needs assistance Sitting-balance support: No upper extremity supported, Feet supported Sitting balance-Leahy Scale: Good Sitting balance - Comments: no external support seated EOB   Standing balance support: During functional activity, Reliant on assistive device for balance Standing balance-Leahy Scale: Fair Standing balance comment: static standing during grooming task                           ADL either performed or assessed with clinical judgement   ADL Overall ADL's : Needs assistance/impaired     Grooming: Oral care;Standing Grooming Details (indicate cue type and reason): standing at sink         Upper Body Dressing : Moderate assistance Upper Body Dressing Details (indicate cue type and reason): donning LVAD battery vest     Toilet Transfer: Contact guard assist;Ambulation;Rolling walker (2 wheels)           Functional mobility during ADLs: Contact guard assist;Rolling walker (2 wheels)      Extremity/Trunk Assessment Upper Extremity Assessment Upper Extremity Assessment: Generalized weakness   Lower Extremity Assessment Lower Extremity Assessment: Defer to PT evaluation        Vision       Perception Perception Perception: Not tested   Praxis Praxis Praxis: Not tested    Cognition Arousal: Alert Behavior During Therapy: Steven [name] for tasks assessed/performed, Flat affect Overall Cognitive Status: No family/caregiver present to determine baseline cognitive functioning  Exercises      Shoulder Instructions       General Comments VSS    Pertinent Vitals/ Pain       Pain Assessment Pain Assessment: No/denies pain  Home Living                                          Prior Functioning/Environment              Frequency  Min 1X/week        Progress Toward Goals  OT Goals(current goals can now be found in the care plan section)   Progress towards OT goals: Progressing toward goals  Acute Rehab OT Goals Patient Stated Goal: none stated OT Goal Formulation: With patient Time For Goal Achievement: 03/24/23 Potential to Achieve Goals: Good ADL Goals Pt Will Perform Upper Body Dressing: with contact guard assist;sitting Pt Will Perform Lower Body Dressing: with min assist;sit to/from stand;sitting/lateral leans Pt Will Transfer to Toilet: with min assist;ambulating;regular height toilet Additional ADL Goal #1: pt will perform bed mobility with supervision in prep for ADLs  Plan      Co-evaluation                 AM-PAC OT "6 Clicks" Daily Activity     Outcome Measure   Help from another person eating meals?: A Little Help from another person taking care of personal grooming?: A Little Help from another person toileting, which includes using toliet, bedpan, or urinal?: A Lot Help from another person bathing (including washing, rinsing, drying)?: A Lot Help from another person to put on and taking off regular upper body clothing?: A Lot Help from another person to put on and taking off regular lower body clothing?: A Lot 6 Click Score: 14    End of Session Equipment Utilized During Treatment: Rolling walker (2 wheels)  OT Visit Diagnosis: Unsteadiness on feet (R26.81);Other abnormalities of gait and mobility (R26.89);Other (comment)   Activity Tolerance Patient tolerated treatment well   Patient Left in chair;with call bell/phone within reach   Nurse Communication Mobility status (LVAD system with blinking light, RN and NP in room to assess)        Time: 1252-1315 OT Time Calculation (min): 23 min  Charges: OT General Charges $OT Visit: 1 Visit OT Treatments $Self Care/Home Management : 8-22 mins $Therapeutic Activity: 8-22 mins  Carver Fila, OTD, OTR/L SecureChat Preferred Acute Rehab [phone]   Carver Fila Koonce 03/20/2023, 1:41 PM

## 2023-03-20 NOTE — H&P (Addendum)
Physical Medicine and Rehabilitation Admission H&P        Chief Complaint  Patient presents with   Respiratory Distress  : HPI: Steven [name] with history of ischemic cardiomyopathy/STEMI with ejection fraction of 20 to 25%, prediabetes, systolic congestive heart failure, 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  Recent admission 4/21 to 10/18/2022 for heart failure exacerbation per chart review patient lives alone independent prior to admission.  Two-level home bed bath upstairs.  Presented 02/26/2023 with dyspnea.  Chest x-ray showed severe diffuse bilateral airspace disease, worse on the right and improving on the left.  Admission chemistries unremarkable except BNP 1060, potassium 8.3.  Echocardiogram with ejection fraction less than 20%.  Left ventricle demonstrating severe decreased function.  Cardiology follow-up for acute on chronic combined systolic and diastolic congestive heart failure.  Underwent cardiac catheterization showing normal RA pressure, mild pulmonary hypertension mildly elevated wedge pressure and moderately reduced cardiac output.  CT of the chest showed extensive calcification of his pericardium particularly over the right atrium and atrioventricular groove part of the right ventricle.  His pericardial space likely obliterated due to remote pericarditis.  He was started on milrinone as well as diuresis.  Evaluated by cardiothoracic surgery for LVAD completed 03/08/2023 per Dr. Maren Beach.  He remained intubated through 03/09/2023.  Hospital course is chronic Coumadin has been resumed followed by heart failure team.  He did go into A-fib with RVR 9/14 IV amiodarone started and slowly weaned transition to p.o currently 200 mg twice daily.  Acute blood loss anemia 9.2 and monitored.  Urology  was consulted 9/17 for hematuria he was placed on Proscar and hematuria has resolved his urinalysis is unremarkable persistent leukocytosis maintained on empiric Rocephin..  Creatinine remained stable 1.74-1.17.  Therapies initiated with sternal precautions.  Therapy evaluations completed due to patient's debility and deconditioning was admitted for a comprehensive rehab program.   Review of Systems  Constitutional:  Negative for chills and fever.  HENT:  Negative for hearing loss.   Eyes:  Negative for blurred vision and double vision.  Respiratory:  Positive for shortness of breath. Negative for wheezing.   Cardiovascular:  Positive for palpitations and leg swelling. Negative for chest pain.  Gastrointestinal:  Positive for constipation. Negative for heartburn, nausea and vomiting.  Genitourinary:  Positive for hematuria and urgency. Negative for dysuria.  Musculoskeletal:  Positive for joint pain and myalgias.  Skin:  Negative for rash.  Neurological:  Positive for weakness.  All other systems reviewed and are negative.       Past Medical History:  Diagnosis Date   Acute MI, anterior wall (HCC)     AICD (automatic cardioverter/defibrillator) present     CAD (coronary artery disease)      2D ECHO, 07/13/2011 - EF <25%, LV moderatelty dilated, LA moderately dilatedLEXISCAN, 12/14/2011 - moderate-severe perfusion defect seen in the basal anteroseptal, mid anterior, apicacl anterior, apical, apical inferior, and apical lateral regions, post-stress EF 25%, new EKG changes from baseline abnormalities   [facility])      Prostate   CHF (congestive heart failure) (HCC) 2012   Hypertension 08/08/2021   Inguinal hernia, left     Pneumonia      November 2023   Pre-diabetes               Past Surgical History:  Procedure Laterality Date  CARDIAC CATHETERIZATION   11/25/2010    Predilation balloon-Apex monorail 2x35mm, Cutting balloon-2.25x68mm, resulting in a reduction of 100% stenosis  down to less than 10%   CARDIAC CATHETERIZATION   07/12/2010    LAD stented with a 3.5x34mm bare-metal Veriflex stent resulting in a reduction of 100% lesion to 0%   CARDIAC DEFIBRILLATOR PLACEMENT   03/02/2011    Medtronic Protecta XT DR, model #[id number], serial #[id number]   COLONOSCOPY       GOLD SEED IMPLANT N/A 07/21/2021    Procedure: GOLD SEED IMPLANT;  Surgeon: Heloise Purpura, MD;  Location: [facility] ORS;  Service: Urology;  Laterality: N/A;  NEEDS 30 MIN   HERNIA REPAIR   2018   ICD GENERATOR CHANGEOUT N/A 02/24/2019    Procedure: ICD GENERATOR CHANGEOUT;  Surgeon: Thurmon Fair, MD;  Location: [facility] INV[facility];  Service: Cardiovascular;  Laterality: N/A;   INSERTION OF IMPLANTABLE LEFT VENTRICULAR ASSIST DEVICE N/A 03/08/2023    Procedure: INSERTION OF IMPLANTABLE LEFT VENTRICULAR ASSIST DEVICE;  Surgeon: Alleen Borne, MD;  Location: [facility] OR;  Service: Open Heart Surgery;  Laterality: N/A;   PACEMAKER PLACEMENT   07/14/2010    Temporary placement of pacemaker, if rhythm issue continues will need a permanent device   RIGHT HEART CATH N/A 02/28/2023    Procedure: RIGHT HEART CATH;  Surgeon: Iran Ouch, MD;  Location: [facility];  Service: Cardiovascular;  Laterality: N/A;   RIGHT HEART CATH N/A 03/06/2023    Procedure: RIGHT HEART CATH;  Surgeon: Dorthula Nettles, DO;  Location: [facility];  Service: Cardiovascular;  Laterality: N/A;   SPACE OAR INSTILLATION N/A 07/21/2021    Procedure: SPACE OAR INSTILLATION;  Surgeon: Heloise Purpura, MD;  Location: [facility];  Service: Urology;  Laterality: N/A;   TEE WITHOUT CARDIOVERSION N/A 03/08/2023    Procedure: TRANSESOPHAGEAL ECHOCARDIOGRAM;  Surgeon: Alleen Borne, MD;  Location: [facility] OR;  Service: Open Heart Surgery;  Laterality: N/A;        History reviewed. No pertinent family history.     Social History:  reports that he quit smoking about 12 years ago. His smoking use included cigarettes and cigars. He has never used  smokeless tobacco. He reports current alcohol use of about 2.0 - 3.0 standard drinks of alcohol per week. He reports that he does not use drugs. Allergies:  Allergies  No Known Allergies         Medications Prior to Admission  Medication Sig Dispense Refill   acetaminophen (TYLENOL) 500 MG tablet Take 1,000 mg by mouth every 6 (six) hours as needed for mild pain.       amiodarone (PACERONE) 200 MG tablet Take 2 tablets (400 mg total) by mouth 2 (two) times daily for 14 days, THEN 1 tablet (200 mg total) 2 (two) times daily for 14 days. 84 tablet 0   atorvastatin (LIPITOR) 80 MG tablet Take 1 tablet (80 mg total) by mouth daily. 30 tablet 3   Cholecalciferol (VITAMIN D) 125 MCG (5000 UT) CAPS Take 5,000 Units by mouth daily.       dapagliflozin propanediol (FARXIGA) 10 MG TABS tablet Take 1 tablet (10 mg total) by mouth daily. 30 tablet 3   metoprolol succinate (TOPROL-XL) 25 MG 24 hr tablet Take 1 tablet (25 mg total) by mouth daily. 30 tablet 2   Multiple Vitamin (MULTIVITAMIN) tablet Take 1 tablet by mouth daily.       nitroGLYCERIN (NITROSTAT) 0.4 MG SL tablet Place 1 tablet (  0.4 mg total) under the tongue every 5 (five) minutes as needed. Usual dose is q 5 minutes x 3 doses. 75 tablet 0   potassium chloride SA (KLOR-CON M) 20 MEQ tablet Take 1 tablet (20 mEq total) by mouth daily. 30 tablet 2   sacubitril-valsartan (ENTRESTO) 24-26 MG Take 1 tablet by mouth 2 (two) times daily. 60 tablet 3   spironolactone (ALDACTONE) 25 MG tablet Take 0.5 tablets (12.5 mg total) by mouth every other day. TAKE EOD ON EVEN DAYS 15 tablet 6   tamsulosin (FLOMAX) 0.4 MG CAPS capsule Take 0.4 mg by mouth in the morning and at bedtime.       torsemide (DEMADEX) 20 MG tablet Take 1 tablet by mouth every other day. 45 tablet 3   warfarin (COUMADIN) 5 MG tablet Take 1 tablet daily except 1/2 tablet on Wednesdays and Fridays or as directed. (Patient taking differently: Take 2.5-5 mg by mouth See admin instructions.  Take 1 tablet by mouth every Tue and Thur, then take 1/2 tablet all other days) 90 tablet 1   ZENPEP 40000-126000 units CPEP Take 1 capsule by mouth 3 (three) times daily with meals.       amiodarone (PACERONE) 200 MG tablet Take 1 tablet (200 mg) by mouth daily. 30 tablet 5              Home: Home Living Family/patient expects to be discharged to:: Private residence Living Arrangements: Alone Available Help at Discharge: Other (Comment) Type of Home: House Home Access: Level entry Home Layout: Two level, Bed/bath upstairs Alternate Level Stairs-Number of Steps: flight Alternate Level Stairs-Rails: Right Bathroom Shower/Tub: Engineer, manufacturing systems: Standard Home Equipment: None   Functional History: Prior Function Prior Level of Function : Independent/Modified Independent, Driving Mobility Comments: no AD use ADLs Comments: ind   Functional Status:  Mobility: Bed Mobility Overal bed mobility: Needs Assistance Bed Mobility: Sit to Supine Sidelying to sit: Mod assist Supine to sit: Min assist Sit to supine: Min assist General bed mobility comments: pt received up in chair and returned to chair Transfers Overall transfer level: Needs assistance Equipment used: Rolling walker (2 wheels) Transfers: Sit to/from Stand, Bed to chair/wheelchair/BSC Sit to Stand: Min assist Bed to/from chair/wheelchair/BSC transfer type:: Step pivot Step pivot transfers: Min assist General transfer comment: use of heart pillow, minA to initiated rocking momentum and anterior weight shift to power up through legs due to posterior lean up against recliner Ambulation/Gait Ambulation/Gait assistance: Min assist Gait Distance (Feet): 300 Feet Assistive device: Rolling walker (2 wheels) Gait Pattern/deviations: Step-through pattern, Decreased stride length, Trunk flexed General Gait Details: minA for walker management. decreased cadence, verbal cues to look forward, verbal cues to stay in  walker during turns  Sats maintained at 97% overall. Gait velocity: dec Gait velocity interpretation: <1.31 ft/sec, indicative of household ambulator   ADL: ADL Overall ADL's : Needs assistance/impaired Eating/Feeding: Set up, Sitting Eating/Feeding Details (indicate cue type and reason): at EOB Grooming: Wash/dry hands, Wash/dry face, Oral care, Set up, Sitting Grooming Details (indicate cue type and reason): at EOB Upper Body Bathing: Moderate assistance Lower Body Bathing: Maximal assistance Upper Body Dressing : Minimal assistance, Sitting, Cueing for compensatory techniques Upper Body Dressing Details (indicate cue type and reason): at EOB Lower Body Dressing: Minimal assistance, Cueing for compensatory techniques, Sitting/lateral leans Lower Body Dressing Details (indicate cue type and reason): at EOB Toilet Transfer: Minimal assistance, BSC/3in1, Rolling walker (2 wheels), Cueing for sequencing (step-pivot) Toilet Transfer Details (indicate cue  type and reason): simulated to chair Toileting- Clothing Manipulation and Hygiene: Maximal assistance Functional mobility during ADLs: Minimal assistance General ADL Comments: Pt requiring mod cues to follow sternal precautions and for compensatory techniques during dressing tasks and sit/stand transfers.   Cognition: Cognition Overall Cognitive Status: No family/caregiver present to determine baseline cognitive functioning Orientation Level: Oriented X4 Cognition Arousal: Alert Behavior During Therapy: WFL for tasks assessed/performed, Flat affect Overall Cognitive Status: No family/caregiver present to determine baseline cognitive functioning General Comments: difficulty problem solving switching LVAD from wall unit to batteries, impaired problem solving, impaired STM, delayed processing   Physical Exam: Blood pressure 112/83, pulse 80, temperature 98.1 F (36.7 C), temperature source Oral, resp. rate 16, height 5\' 9"  (1.753 m),  weight 67.9 kg, SpO2 100%. Physical Exam Constitutional:      General: He is not in acute distress.    Appearance: He is ill-appearing.  HENT:     Head: Normocephalic.     Right Ear: External ear normal.     Left Ear: External ear normal.     Nose: Nose normal.  Eyes:     Extraocular Movements: Extraocular movements intact.     Pupils: Pupils are equal, round, and reactive to light.  Cardiovascular:     Comments: LVAD Hum Pulmonary:     Effort: Pulmonary effort is normal. No respiratory distress.     Breath sounds: Normal breath sounds.  Musculoskeletal:     Cervical back: Normal range of motion.  Neurological:     Comments: Patient is alert sitting up in bed.  Follows commands.  Oriented x 3. Normal language, speech. Functional memory and awareness. UE motor 4/5. LE motor 3+/5 HF, KE 4/5, ADF/PF 4+/5. No sensory deficits appreciated. Normal muscle tone.   Psychiatric:     Comments: Pt is flat but cooperative.         Lab Results Last 48 Hours        Results for orders placed or performed during the hospital encounter of 02/26/23 (from the past 48 hour(s))  Glucose, capillary     Status: Abnormal    Collection Time: 03/18/23 11:06 AM  Result Value Ref Range    Glucose-Capillary 133 (H) 70 - 99 mg/dL      Comment: Glucose reference range applies only to samples taken after fasting for at least 8 hours.  Glucose, capillary     Status: Abnormal    Collection Time: 03/18/23  4:11 PM  Result Value Ref Range    Glucose-Capillary 131 (H) 70 - 99 mg/dL      Comment: Glucose reference range applies only to samples taken after fasting for at least 8 hours.  Glucose, capillary     Status: None    Collection Time: 03/18/23  9:33 PM  Result Value Ref Range    Glucose-Capillary 92 70 - 99 mg/dL      Comment: Glucose reference range applies only to samples taken after fasting for at least 8 hours.    Comment 1 Notify RN      Comment 2 Document in Chart    Lactate dehydrogenase      Status: Abnormal    Collection Time: 03/19/23  5:30 AM  Result Value Ref Range    LDH 285 (H) 98 - 192 U/L      Comment: Performed at [facility], [address], [place], [place] [address]  Protime-INR     Status: Abnormal    Collection Time: 03/19/23  5:30 AM  Result Value Ref Range    Prothrombin Time 25.8 (H) 11.4 - 15.2 seconds    INR 2.3 (H) 0.8 - 1.2      Comment: (NOTE) INR goal varies based on device and disease states. Performed at [facility], [address], [place], [place] [address]    Cooxemetry Panel (carboxy, met, total hgb, O2 sat)     Status: Abnormal    Collection Time: 03/19/23  5:30 AM  Result Value Ref Range    Total hemoglobin 10.1 (L) 12.0 - 16.0 g/dL    O2 Saturation 56.2 %    Carboxyhemoglobin 1.9 (H) 0.5 - 1.5 %    Methemoglobin <0.7 0.0 - 1.5 %      Comment: Performed at [facility] Lab, [address], [place], [place] [address]  CBC     Status: Abnormal    Collection Time: 03/19/23  5:30 AM  Result Value Ref Range    WBC 17.5 (H) 4.0 - 10.5 K/uL    RBC 3.07 (L) 4.22 - 5.81 MIL/uL    Hemoglobin 9.2 (L) 13.0 - 17.0 g/dL    HCT 57.8 (L) 46.9 - 52.0 %    MCV 94.1 80.0 - 100.0 fL    MCH 30.0 26.0 - 34.0 pg    MCHC 31.8 30.0 - 36.0 g/dL    RDW 62.9 52.8 - 41.3 %    Platelets 439 (H) 150 - 400 K/uL    nRBC 0.0 0.0 - 0.2 %      Comment: Performed at [facility], [address] [place], [place] [address]  Basic metabolic panel     Status: Abnormal    Collection Time: 03/19/23  5:30 AM  Result Value Ref Range    Sodium 131 (L) 135 - 145 mmol/L    Potassium 4.0 3.5 - 5.1 mmol/L    Chloride 98 98 - 111 mmol/L    CO2 25 22 - 32 mmol/L    Glucose, Bld 99 70 - 99 mg/dL      Comment: Glucose reference range applies only to samples taken after fasting for at least 8 hours.    BUN 19 8 - 23 mg/dL    Creatinine, Ser 0.27 0.61 - 1.24 mg/dL    Calcium 8.2 (L) 8.9 - 10.3 mg/dL    GFR, Estimated >25 >36 mL/min      Comment:  (NOTE) Calculated using the CKD-EPI Creatinine Equation (2021)      Anion gap 8 5 - 15      Comment: Performed at [facility] Lab, [address] [place], [place] [address]  Magnesium     Status: None    Collection Time: 03/19/23  5:30 AM  Result Value Ref Range    Magnesium 2.1 1.7 - 2.4 mg/dL      Comment: Performed at [facility] Lab, [address] [place], [place] [address]  Glucose, capillary     Status: None    Collection Time: 03/19/23  5:55 AM  Result Value Ref Range    Glucose-Capillary 95 70 - 99 mg/dL      Comment: Glucose reference range applies only to samples taken after fasting for at least 8 hours.    Comment 1 Notify RN      Comment 2 Document in Chart    Glucose, capillary     Status: Abnormal    Collection Time: 03/19/23 12:27 PM  Result Value Ref Range    Glucose-Capillary 198 (H)  70 - 99 mg/dL      Comment: Glucose reference range applies only to samples taken after fasting for at least 8 hours.  Glucose, capillary     Status: None    Collection Time: 03/19/23  4:19 PM  Result Value Ref Range    Glucose-Capillary 85 70 - 99 mg/dL      Comment: Glucose reference range applies only to samples taken after fasting for at least 8 hours.  Glucose, capillary     Status: Abnormal    Collection Time: 03/19/23  9:26 PM  Result Value Ref Range    Glucose-Capillary 150 (H) 70 - 99 mg/dL      Comment: Glucose reference range applies only to samples taken after fasting for at least 8 hours.    Comment 1 Notify RN      Comment 2 Document in Chart    Protime-INR     Status: Abnormal    Collection Time: 03/20/23  5:30 AM  Result Value Ref Range    Prothrombin Time 28.2 (H) 11.4 - 15.2 seconds    INR 2.6 (H) 0.8 - 1.2      Comment: (NOTE) INR goal varies based on device and disease states. Performed at [facility] Lab, [address], [place], [place] [address]    Cooxemetry Panel (carboxy, met, total hgb, O2 sat)     Status: Abnormal    Collection Time:  03/20/23  5:30 AM  Result Value Ref Range    Total hemoglobin 9.9 (L) 12.0 - 16.0 g/dL    O2 Saturation 13.0 %    Carboxyhemoglobin 2.3 (H) 0.5 - 1.5 %    Methemoglobin <0.7 0.0 - 1.5 %      Comment: Performed at [facility], [address] [place], [place] [address]  CBC     Status: Abnormal    Collection Time: 03/20/23  5:48 AM  Result Value Ref Range    WBC 18.0 (H) 4.0 - 10.5 K/uL    RBC 3.03 (L) 4.22 - 5.81 MIL/uL    Hemoglobin 9.2 (L) 13.0 - 17.0 g/dL    HCT 46.9 (L) 62.9 - 52.0 %    MCV 94.1 80.0 - 100.0 fL    MCH 30.4 26.0 - 34.0 pg    MCHC 32.3 30.0 - 36.0 g/dL    RDW 52.8 41.3 - 24.4 %    Platelets 464 (H) 150 - 400 K/uL    nRBC 0.0 0.0 - 0.2 %      Comment: Performed at [facility], [address], [place], [place] [address]      Imaging Results (Last 48 hours)  No results found.         Blood pressure 112/83, pulse 80, temperature 98.1 F (36.7 C), temperature source Oral, resp. rate 16, height 5\' 9"  (1.753 m), weight 67.9 kg, SpO2 100%.   Medical Problem List and Plan: 1. Functional deficits secondary to debility after heart failure/ LVAD implant 9/12.   -Sternal precautions             -patient may not yet shower             -ELOS/Goals: 7-10 days, mod I goals with PT/OT 2.  Antithrombotics: -DVT/anticoagulation:  Pharmaceutical: Coumadin             -antiplatelet therapy: Aspirin 81 mg daily 3. Pain Management: Oxycodone/tramadol as needed 4. Mood/Behavior/Sleep: N/A             -  antipsychotic agents: N/A 5. Neuropsych/cognition: This patient is capable of making decisions on his own behalf. 6. Skin/Wound Care: Routine skin checks             -local care to drive site  7. Fluids/Electrolytes/Nutrition: encourage PO             -hyponatremia of 128 today, trending down             -f/u labs in AM 8.  Acute on chronic combined systolic and diastolic congestive heart failure.  Follow-up per heart failure team.  Continue Aldactone 12.5 mg  daily as well as Entresto 24-26 mg twice daily             -daily weights 9.  Acute blood loss anemia.  Follow-up CBC 10.  Hematuria/UTI/persistent leukocytosis.  Placed on Proscar per urology services.  Complete empiric course of Rocephin. 11.  Hyperlipidemia.  Lipitor 12.  Prediabetes.  Hemoglobin A1c 6.6.  SSI 13.  CKD stage III a?  Follow-up chemistries             -recent Cr down to 1.12 14.  Atrial fibrillation.  Amiodarone 200 mg twice daily.  Continue Coumadin   Charlton Amor, PA-C 03/20/2023  I have personally performed a face to face diagnostic evaluation of this patient and formulated the key components of the plan.  Additionally, I have personally reviewed laboratory data, imaging studies, as well as relevant notes and concur with the physician assistant's documentation above.  The patient's status has not changed from the original H&P.  Any changes in documentation from the acute care chart have been noted above.  [name], MD, [place]

## 2023-03-21 DIAGNOSIS — N1831 Chronic kidney disease, stage 3a: Secondary | ICD-10-CM

## 2023-03-21 DIAGNOSIS — N183 Chronic kidney disease, stage 3 unspecified: Secondary | ICD-10-CM

## 2023-03-21 DIAGNOSIS — R7401 Elevation of levels of liver transaminase levels: Secondary | ICD-10-CM

## 2023-03-21 DIAGNOSIS — D72829 Elevated white blood cell count, unspecified: Secondary | ICD-10-CM

## 2023-03-21 DIAGNOSIS — N179 Acute kidney failure, unspecified: Secondary | ICD-10-CM

## 2023-03-21 LAB — CBC WITH DIFFERENTIAL/PLATELET
Abs Immature Granulocytes: 0.11 10*3/uL — ABNORMAL HIGH (ref 0.00–0.07)
Basophils Absolute: 0.1 10*3/uL (ref 0.0–0.1)
Basophils Relative: 0 %
Eosinophils Absolute: 0 10*3/uL (ref 0.0–0.5)
Eosinophils Relative: 0 %
HCT: 29 % — ABNORMAL LOW (ref 39.0–52.0)
Hemoglobin: 9.2 g/dL — ABNORMAL LOW (ref 13.0–17.0)
Immature Granulocytes: 1 %
Lymphocytes Relative: 5 %
Lymphs Abs: 0.8 10*3/uL (ref 0.7–4.0)
MCH: 29.8 pg (ref 26.0–34.0)
MCHC: 31.7 g/dL (ref 30.0–36.0)
MCV: 93.9 fL (ref 80.0–100.0)
Monocytes Absolute: 1.5 10*3/uL — ABNORMAL HIGH (ref 0.1–1.0)
Monocytes Relative: 9 %
Neutro Abs: 13 10*3/uL — ABNORMAL HIGH (ref 1.7–7.7)
Neutrophils Relative %: 85 %
Platelets: 528 10*3/uL — ABNORMAL HIGH (ref 150–400)
RBC: 3.09 MIL/uL — ABNORMAL LOW (ref 4.22–5.81)
RDW: 15 % (ref 11.5–15.5)
WBC: 15.4 10*3/uL — ABNORMAL HIGH (ref 4.0–10.5)
nRBC: 0 % (ref 0.0–0.2)

## 2023-03-21 LAB — COMPREHENSIVE METABOLIC PANEL
ALT: 131 U/L — ABNORMAL HIGH (ref 0–44)
AST: 112 U/L — ABNORMAL HIGH (ref 15–41)
Albumin: 2.5 g/dL — ABNORMAL LOW (ref 3.5–5.0)
Alkaline Phosphatase: 82 U/L (ref 38–126)
Anion gap: 11 (ref 5–15)
BUN: 19 mg/dL (ref 8–23)
CO2: 21 mmol/L — ABNORMAL LOW (ref 22–32)
Calcium: 8.5 mg/dL — ABNORMAL LOW (ref 8.9–10.3)
Chloride: 99 mmol/L (ref 98–111)
Creatinine, Ser: 1.11 mg/dL (ref 0.61–1.24)
GFR, Estimated: >60 mL/min (ref >60)
Glucose, Bld: 80 mg/dL (ref 70–99)
Potassium: 4.2 mmol/L (ref 3.5–5.1)
Sodium: 131 mmol/L — ABNORMAL LOW (ref 135–145)
Total Bilirubin: 1.1 mg/dL (ref 0.3–1.2)
Total Protein: 6.2 g/dL — ABNORMAL LOW (ref 6.5–8.1)

## 2023-03-21 LAB — GLUCOSE, CAPILLARY
Glucose-Capillary: 95 mg/dL (ref 70–99)
Glucose-Capillary: 121 mg/dL — ABNORMAL HIGH (ref 70–99)
Glucose-Capillary: 113 mg/dL — ABNORMAL HIGH (ref 70–99)
Glucose-Capillary: 110 mg/dL — ABNORMAL HIGH (ref 70–99)

## 2023-03-21 LAB — PROTIME-INR
INR: 2.5 — ABNORMAL HIGH (ref 0.8–1.2)
Prothrombin Time: 26.9 seconds — ABNORMAL HIGH (ref 11.4–15.2)

## 2023-03-21 LAB — CYTOLOGY - NON PAP

## 2023-03-21 LAB — LACTATE DEHYDROGENASE: LDH: 386 U/L — ABNORMAL HIGH (ref 98–192)

## 2023-03-21 MED ORDER — CHLORHEXIDINE GLUCONATE CLOTH 2 % EX PADS
6.0000 | MEDICATED_PAD | Freq: Two times a day (BID) | CUTANEOUS | Status: DC
  Administered 2023-03-21 – 2023-03-27 (×13): 6 via TOPICAL

## 2023-03-21 MED ORDER — LACTATED RINGERS IV BOLUS
500.0000 mL | Freq: Once | INTRAVENOUS | Status: AC
  Administered 2023-03-21: 500 mL via INTRAVENOUS

## 2023-03-21 MED ORDER — WARFARIN SODIUM 1 MG PO TABS
1.0000 mg | ORAL_TABLET | Freq: Once | ORAL | Status: AC
  Administered 2023-03-21: 1 mg via ORAL
  Filled 2023-03-21: qty 1

## 2023-03-21 MED FILL — Electrolyte-R (PH 7.4) Solution: INTRAVENOUS | Qty: 4000 | Status: AC

## 2023-03-21 MED FILL — Sodium Bicarbonate IV Soln 8.4%: INTRAVENOUS | Qty: 100 | Status: AC

## 2023-03-21 MED FILL — Sodium Chloride IV Soln 0.9%: INTRAVENOUS | Qty: 2000 | Status: AC

## 2023-03-21 MED FILL — Heparin Sodium (Porcine) Inj 1000 Unit/ML: INTRAMUSCULAR | Qty: 30 | Status: AC

## 2023-03-21 MED FILL — Mannitol IV Soln 20%: INTRAVENOUS | Qty: 500 | Status: AC

## 2023-03-21 MED FILL — Heparin Sodium (Porcine) Inj 1000 Unit/ML: INTRAMUSCULAR | Qty: 10 | Status: AC

## 2023-03-21 NOTE — Progress Notes (Signed)
Inpatient Rehabilitation  Patient information reviewed and entered into eRehab system by Demarrio Menges Davina Howlett, OTR/L, Rehab Quality Coordinator.   Information including medical coding, functional ability and quality indicators will be reviewed and updated through discharge.   

## 2023-03-21 NOTE — Discharge Summary (Incomplete)
unchanged. Right internal jugular Swan-Ganz catheter is unchanged. Bilateral chest tubes are noted without pneumothorax. Left ventricular assist device is unchanged. Left-sided defibrillator is again noted. Minimal bibasilar subsegmental atelectasis is noted. IMPRESSION: Stable support apparatus as noted above. Minimal bibasilar subsegmental atelectasis. Electronically Signed   By: Lupita Raider M.D.   On: 03/09/2023 10:35   DG Chest Port 1 View  Result Date: 03/08/2023 CLINICAL DATA:  [id number] LVAD (left ventricular assist device) present (HCC) 147829. EXAM: PORTABLE CHEST 1 VIEW COMPARISON:   03/07/2023. FINDINGS: Interval median sternotomy and placement of a left ventricular assist device. Endotracheal tube tip projects over the midthoracic trachea. Unchanged Left chest AICD/pacemaker with leads projecting over the right atrium and ventricle. Unchanged right IJ approach pulmonary arterial catheter with tip projecting over the right main pulmonary artery. Pleural mediastinal chest tubes are in place. Enteric tube side port projects over the lower esophagus. Interval advancement is recommended. Left basilar atelectasis. No consolidation or pulmonary edema. No pleural effusion or pneumothorax. IMPRESSION: 1. Interval median sternotomy and placement of a left ventricular assist device. 2. Enteric tube side port projects over the lower esophagus. Interval advancement is recommended. Electronically Signed   By: Orvan Falconer M.D.   On: 03/08/2023 15:31   ECHO INTRAOPERATIVE TEE  Result Date: 03/08/2023  *INTRAOPERATIVE TRANSESOPHAGEAL REPORT *  Patient Name:   Steven [name] [facility]   Date of Exam: 03/08/2023 Medical Rec #:  [id number]      Height:       69.0 in Accession #:    [id number]     Weight:       155.6 lb Date of Birth:  23-Apr-1956      BSA:          1.86 m Patient Age:    67 years       BP:           108/67 mmHg Patient Gender: M              HR:           71 bpm. Exam Location:  Anesthesiology Transesophogeal exam was perform intraoperatively during surgical procedure. Patient was closely monitored under general anesthesia during the entirety of examination. Indications:     CAD; CHF Performing Phys: Anice Paganini DO Complications: No known complications during this procedure. POST-OP IMPRESSIONS _ Left Ventricle: Left ventricular function remains severely reduced post bypass. LVAD inflow cannula is visualized at LV apex, directed towards the mitral valve. _ Right Ventricle: Right ventricular function is normal on inotropic support. _ Aorta: The aorta appears unchanged from pre-bypass. There is no  dissection present in the aorta. _ Left Atrial Appendage: The left atrial appendage appears unchanged from pre-bypass. _ Aortic Valve: The aortic valve appears unchanged from pre-bypass. The valve is not visualized opening with LV contraction following initiation of LVAD. _ Mitral Valve: No regurgitation post repair. _ Tricuspid Valve: There is mild regurgitation. _ Pulmonic Valve: The pulmonic valve appears unchanged from pre-bypass. _ Interatrial Septum: Remains midline on 5200RPMs. _ Interventricular Septum: Remains midline on 5200RPMs. PRE-OP FINDINGS  Left Ventricle: The left ventricle has severely reduced systolic function, with an ejection fraction of 14.6% by Simpsons biplane method of discs. No evidence of LV thrombus. Right Ventricle: The right ventricle has normal systolic function. The cavity was normal. There is no increase in right ventricular wall thickness. FAC 42.8%. TAPSE 1.8cm Left Atrium: No left atrial/left atrial appendage thrombus was detected. Right Atrium: Right atrial size was normal in  with no cavities or periapical abscesses seen. IMPRESSION: Negative. Electronically Signed   By: Beckie Salts M.D.   On: 03/01/2023 15:46   [place] EKG SITE RITE  Result Date: 03/01/2023 If Site Rite image not attached, placement could not be confirmed due to current cardiac rhythm.  CARDIAC CATHETERIZATION  Result Date: 02/28/2023 Successful right heart  catheterization via the right antecubital vein. Normal RA pressure, mild pulmonary hypertension, mildly elevated wedge pressure and moderately reduced cardiac output/index. RA: 5 mmHg RV 43/3 with an RVEDP of 8 mmHg PW: 26/37 with a mean of 19 mmHg.  Prominent V wave. PA: 44/13 with a mean of 27 mmHg. PA sat is 67% with cardiac output of 3.95 and cardiac index of 2.15. 2.  Severe right-sided pericardial calcifications of unclear etiology.  No evidence of constriction by hemodynamic evaluation. Recommendations: Recommend gentle diuresis. Anticoagulation can be resumed. Consider advanced heart failure consult.   ECHOCARDIOGRAM COMPLETE  Result Date: 02/27/2023    ECHOCARDIOGRAM REPORT   Patient Name:   Steven [name] [facility] Date of Exam: 02/27/2023 Medical Rec #:  [id number]    Height:       69.0 in Accession #:    [id number]   Weight:       158.5 lb Date of Birth:  [birth year]    BSA:          1.872 m Patient Age:    67 years     BP:           96/65 mmHg Patient Gender: M            HR:           65 bpm. Exam Location:  Inpatient Procedure: 2D Echo, Cardiac Doppler, Color Doppler and Intracardiac            Opacification Agent Indications:    CHF  History:        Patient has prior history of Echocardiogram examinations, most                 recent 05/09/2022. CHF, CAD, Defibrillator; Risk                 Factors:Hypertension.  Sonographer:    Darlys Gales Referring Phys: [id number] MAURICIO Grant Swager ARRIEN IMPRESSIONS  1. There is no left ventricular thrombus (Definity was used). . Left ventricular ejection fraction, by estimation, is <20%. The left ventricle has severely decreased function. The left ventricle demonstrates global hypokinesis. The left ventricular internal cavity size was moderately dilated. Left ventricular diastolic parameters are consistent with Grade II diastolic dysfunction (pseudonormalization). Elevated left atrial pressure. There is akinesis of the left ventricular, entire anteroseptal wall, anterior wall  and apical segment.  2. Right ventricular systolic function is mildly reduced. The right ventricular size is normal. There is moderately elevated pulmonary artery systolic pressure. The estimated right ventricular systolic pressure is 55.4 mmHg.  3. Left atrial size was moderately dilated.  4. The mitral valve is normal in structure. Mild mitral valve regurgitation.  5. The aortic valve is tricuspid. There is mild thickening of the aortic valve. Aortic valve regurgitation is not visualized. Aortic valve sclerosis is present, with no evidence of aortic valve stenosis.  6. The inferior vena cava is dilated in size with <50% respiratory variability, suggesting right atrial pressure of 15 mmHg. FINDINGS  Left Ventricle: There is no left ventricular thrombus (Definity was used). Left ventricular ejection fraction, by estimation, is <20%. The left ventricle has severely decreased function. The left ventricle  size. Interatrial Septum: No atrial level shunt detected by color flow Doppler. Agitated saline contrast bubble study was negative, with no evidence of any interatrial shunt. Pericardium: There is no evidence of pericardial effusion. There is no pleural effusion. Mitral Valve: The mitral valve is normal in structure. There is restricted motion of both leaflets with a coaptation point 1.5cm below the annulus. Mitral valve regurgitation is mild by color flow Doppler. The MR jet is centrally-directed (Carpentier type IIIb). Tricuspid Valve: The tricuspid valve was normal in structure. Tricuspid valve regurgitation is mild by color flow Doppler. Tricuspid annulus measures 4.03cm in diameter. Aortic Valve: The aortic valve is normal in structure. Aortic valve regurgitation was not visualized by color flow Doppler. There is no stenosis of the aortic valve. Pulmonic Valve: The pulmonic valve was normal in  structure. Pulmonic valve regurgitation is trivial by color flow Doppler. Aorta: The aortic root is normal in size and structure.  Anice Paganini Electronically signed by Anice Paganini Signature Date/Time: 03/08/2023/3:18:15 PM    Final    EP STUDY  Result Date: 03/08/2023 See surgical note for result.  DG Chest Port 1 View  Result Date: 03/07/2023 CLINICAL DATA:  CHF EXAM: PORTABLE CHEST 1 VIEW COMPARISON:  03/01/2023 chest CT FINDINGS: Much improved aeration with mild interstitial opacity seen symmetrically. Extensive sheet like pericardial calcification especially along the right heart. Dual-chamber ICD/pacer leads from the left. There is a right IJ sheath with catheter at the right main pulmonary artery. Right PICC with tip at the SVC. Stable cardiomediastinal contours. IMPRESSION: 1. Mild interstitial edema, a significant improvement since 02/26/2023. 2. Extensive pericardial calcification. Electronically Signed   By: Tiburcio Pea M.D.   On: 03/07/2023 11:15   CARDIAC CATHETERIZATION  Result Date: 03/06/2023 HEMODYNAMICS: RA:   5 mmHg (mean) RV:   38/5 mmHg PA:   42/15 mmHg (24 mean) PCWP:  14 mmHg (mean)    Estimated Fick CO/CI   3.9 L/min, 2.1 L/min/m2 Thermodilution CO/CI   3.5 L/min, 1.9 L/min/m2    TPG    10  mmHg     PVR     6.2- 6.8 Wood Units PAPi      >5  IMPRESSION: Normal pre and post capillary filling pressures. Moderate to severely reduced cardiac output / index. Elevated PVR [name] [name] 9:58 AM   US RENAL  Result Date: 03/06/2023 CLINICAL DATA:  Preoperative evaluation. EXAM: RENAL / URINARY TRACT ULTRASOUND COMPLETE COMPARISON:  03/01/2023, 08/26/2014. FINDINGS: Right Kidney: Renal measurements: 9.6 x 5.2 x 4.2 cm = volume: 108.76 mL. Echogenicity within normal limits. No mass or hydronephrosis visualized. Left Kidney: Renal measurements: 10.2 x 5.1 x 3.8 cm = volume: 104.01 mL. Echogenicity within normal limits. No mass or hydronephrosis visualized. Bladder: No bladder wall  thickening. Other: There is nodularity of the prostate gland with a 2.3 cm nodule protruding into the posterior aspect of the urinary bladder. Prostate gland was not measured on this exam. IMPRESSION: 1. Normal evaluation of the kidneys. 2. 2.3 cm prostate nodule protruding into the base of the urinary bladder. Electronically Signed   By: Thornell Sartorius M.D.   On: 03/06/2023 01:25   VAS [place] LOWER EXTREMITY VENOUS (DVT)  Result Date: 03/04/2023  Lower Venous DVT Study Patient Name:  Steven [name] [facility]  Date of Exam:   03/02/2023 Medical Rec #: [id number]     Accession #:    [id number] Date of Birth: 03/18/1956     Patient Gender: M Patient Age:   62 years Exam  with no cavities or periapical abscesses seen. IMPRESSION: Negative. Electronically Signed   By: Beckie Salts M.D.   On: 03/01/2023 15:46   [place] EKG SITE RITE  Result Date: 03/01/2023 If Site Rite image not attached, placement could not be confirmed due to current cardiac rhythm.  CARDIAC CATHETERIZATION  Result Date: 02/28/2023 Successful right heart  catheterization via the right antecubital vein. Normal RA pressure, mild pulmonary hypertension, mildly elevated wedge pressure and moderately reduced cardiac output/index. RA: 5 mmHg RV 43/3 with an RVEDP of 8 mmHg PW: 26/37 with a mean of 19 mmHg.  Prominent V wave. PA: 44/13 with a mean of 27 mmHg. PA sat is 67% with cardiac output of 3.95 and cardiac index of 2.15. 2.  Severe right-sided pericardial calcifications of unclear etiology.  No evidence of constriction by hemodynamic evaluation. Recommendations: Recommend gentle diuresis. Anticoagulation can be resumed. Consider advanced heart failure consult.   ECHOCARDIOGRAM COMPLETE  Result Date: 02/27/2023    ECHOCARDIOGRAM REPORT   Patient Name:   Steven [name] [facility] Date of Exam: 02/27/2023 Medical Rec #:  [id number]    Height:       69.0 in Accession #:    [id number]   Weight:       158.5 lb Date of Birth:  [birth year]    BSA:          1.872 m Patient Age:    67 years     BP:           96/65 mmHg Patient Gender: M            HR:           65 bpm. Exam Location:  Inpatient Procedure: 2D Echo, Cardiac Doppler, Color Doppler and Intracardiac            Opacification Agent Indications:    CHF  History:        Patient has prior history of Echocardiogram examinations, most                 recent 05/09/2022. CHF, CAD, Defibrillator; Risk                 Factors:Hypertension.  Sonographer:    Darlys Gales Referring Phys: [id number] MAURICIO Grant Swager ARRIEN IMPRESSIONS  1. There is no left ventricular thrombus (Definity was used). . Left ventricular ejection fraction, by estimation, is <20%. The left ventricle has severely decreased function. The left ventricle demonstrates global hypokinesis. The left ventricular internal cavity size was moderately dilated. Left ventricular diastolic parameters are consistent with Grade II diastolic dysfunction (pseudonormalization). Elevated left atrial pressure. There is akinesis of the left ventricular, entire anteroseptal wall, anterior wall  and apical segment.  2. Right ventricular systolic function is mildly reduced. The right ventricular size is normal. There is moderately elevated pulmonary artery systolic pressure. The estimated right ventricular systolic pressure is 55.4 mmHg.  3. Left atrial size was moderately dilated.  4. The mitral valve is normal in structure. Mild mitral valve regurgitation.  5. The aortic valve is tricuspid. There is mild thickening of the aortic valve. Aortic valve regurgitation is not visualized. Aortic valve sclerosis is present, with no evidence of aortic valve stenosis.  6. The inferior vena cava is dilated in size with <50% respiratory variability, suggesting right atrial pressure of 15 mmHg. FINDINGS  Left Ventricle: There is no left ventricular thrombus (Definity was used). Left ventricular ejection fraction, by estimation, is <20%. The left ventricle has severely decreased function. The left ventricle  with no cavities or periapical abscesses seen. IMPRESSION: Negative. Electronically Signed   By: Beckie Salts M.D.   On: 03/01/2023 15:46   [place] EKG SITE RITE  Result Date: 03/01/2023 If Site Rite image not attached, placement could not be confirmed due to current cardiac rhythm.  CARDIAC CATHETERIZATION  Result Date: 02/28/2023 Successful right heart  catheterization via the right antecubital vein. Normal RA pressure, mild pulmonary hypertension, mildly elevated wedge pressure and moderately reduced cardiac output/index. RA: 5 mmHg RV 43/3 with an RVEDP of 8 mmHg PW: 26/37 with a mean of 19 mmHg.  Prominent V wave. PA: 44/13 with a mean of 27 mmHg. PA sat is 67% with cardiac output of 3.95 and cardiac index of 2.15. 2.  Severe right-sided pericardial calcifications of unclear etiology.  No evidence of constriction by hemodynamic evaluation. Recommendations: Recommend gentle diuresis. Anticoagulation can be resumed. Consider advanced heart failure consult.   ECHOCARDIOGRAM COMPLETE  Result Date: 02/27/2023    ECHOCARDIOGRAM REPORT   Patient Name:   Steven [name] [facility] Date of Exam: 02/27/2023 Medical Rec #:  [id number]    Height:       69.0 in Accession #:    [id number]   Weight:       158.5 lb Date of Birth:  [birth year]    BSA:          1.872 m Patient Age:    67 years     BP:           96/65 mmHg Patient Gender: M            HR:           65 bpm. Exam Location:  Inpatient Procedure: 2D Echo, Cardiac Doppler, Color Doppler and Intracardiac            Opacification Agent Indications:    CHF  History:        Patient has prior history of Echocardiogram examinations, most                 recent 05/09/2022. CHF, CAD, Defibrillator; Risk                 Factors:Hypertension.  Sonographer:    Darlys Gales Referring Phys: [id number] MAURICIO Grant Swager ARRIEN IMPRESSIONS  1. There is no left ventricular thrombus (Definity was used). . Left ventricular ejection fraction, by estimation, is <20%. The left ventricle has severely decreased function. The left ventricle demonstrates global hypokinesis. The left ventricular internal cavity size was moderately dilated. Left ventricular diastolic parameters are consistent with Grade II diastolic dysfunction (pseudonormalization). Elevated left atrial pressure. There is akinesis of the left ventricular, entire anteroseptal wall, anterior wall  and apical segment.  2. Right ventricular systolic function is mildly reduced. The right ventricular size is normal. There is moderately elevated pulmonary artery systolic pressure. The estimated right ventricular systolic pressure is 55.4 mmHg.  3. Left atrial size was moderately dilated.  4. The mitral valve is normal in structure. Mild mitral valve regurgitation.  5. The aortic valve is tricuspid. There is mild thickening of the aortic valve. Aortic valve regurgitation is not visualized. Aortic valve sclerosis is present, with no evidence of aortic valve stenosis.  6. The inferior vena cava is dilated in size with <50% respiratory variability, suggesting right atrial pressure of 15 mmHg. FINDINGS  Left Ventricle: There is no left ventricular thrombus (Definity was used). Left ventricular ejection fraction, by estimation, is <20%. The left ventricle has severely decreased function. The left ventricle  Physician Discharge Summary  Patient ID: Steven [name] MRN: [id number] DOB/AGE: [birth year] 67 y.o.  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 The heart is enlarged with dense pericardial calcifications, unchanged. The upper mediastinal contours are normal. The right pleural effusion has essentially resolved, with improved aeration of the lung base. There is a probable small residual left pleural effusion with mild adjacent airspace opacities, unchanged. There is no new or worsening focal airspace opacity. There is no overt pulmonary edema. There is no pneumothorax There is no acute osseous abnormality. IMPRESSION: 1. Probable small residual left pleural effusion and mild adjacent airspace opacity likely reflecting atelectasis. 2. Essentially resolved right pleural effusion. Electronically Signed   By: Lesia Hausen M.D.   On: 03/20/2023 11:47   DG CHEST PORT 1 VIEW  Result Date: 03/16/2023 CLINICAL DATA:  LVAD present.  Chest pain with cough. EXAM: PORTABLE CHEST 1 VIEW COMPARISON:  Radiograph 03/11/2023 FINDINGS: LVAD in place. Left-sided pacemaker in place. Right-sided central line is stable in positioning. Stable cardiomegaly post CABG. Coarse pericardial calcifications again seen. Improvement in bibasilar airspace disease and pleural effusions. No  pneumothorax. IMPRESSION: 1. Improvement in bibasilar airspace disease and pleural effusions. 2. Stable cardiomegaly post CABG. LVAD in place. Electronically Signed   By: Narda Rutherford M.D.   On: 03/16/2023 09:47   ECHOCARDIOGRAM LIMITED  Result Date: 03/13/2023    ECHOCARDIOGRAM LIMITED REPORT   Patient Name:   Steven [name] [facility] Date of Exam: 03/13/2023 Medical Rec #:  [id number]    Height:       69.0 in Accession #:    [id number]   Weight:       153.4 lb Date of Birth:  Oct 23, 1955    BSA:          1.846 m Patient Age:    67 years     BP:           0/0 mmHg Patient Gender: M            HR:           102 bpm. Exam Location:  Inpatient Procedure: Limited Echo, Cardiac Doppler and Color Doppler Indications:    RAMP LVAD Evaluation  History:        Patient has prior history of Echocardiogram examinations, most                 recent 03/08/2023. CHF, CAD, Defibrillator; Risk                 Factors:Hypertension, Dyslipidemia and ETOH. HMIII LVAD                 Implanted 03/08/23.  Sonographer:    Irving Burton Senior RDCS Referring Phys: [phone] [facility] Ford [facility]  Sonographer Comments: Ramp echo with Dr Shirlee Latch at bedside IMPRESSIONS  1. LVAD inflow cannula noted at apex. Left ventricular ejection fraction, by estimation, is <20%. The left ventricle has severely decreased function. The left ventricle demonstrates global hypokinesis.  fossa.  *See table(s) above for measurements and observations. Electronically signed by Heath Lark on 03/04/2023 at 10:52:37 AM.    Final    VAS US DOPPLER PRE VAD  Result Date: 03/03/2023 PERIOPERATIVE VASCULAR EVALUATION Patient Name:  Steven [name] [facility]  Date of Exam:   03/02/2023 Medical Rec #: [id number]     Accession #:    [id number] Date of Birth: May 19, 1956     Patient Gender: M Patient Age:   55 years Exam Location:  [facility] Procedure:      VAS US DOPPLER PRE VAD Referring Phys: Reuel Boom BENSIMHON --------------------------------------------------------------------------------  Indications:      Pre-VAD. Risk Factors:     Hypertension, hyperlipidemia, past history of smoking, prior                   MI, coronary artery disease. Other Factors:    AF, CKD, CHF, ICD. Comparison Study: No previous exams Performing Technologist: Jean Rosenthal RDMS, RVT Supporting Technologist: Ernestene Mention RVT, RDMS  Examination Guidelines: A complete evaluation includes B-mode imaging, spectral  Doppler, color Doppler, and power Doppler as needed of all accessible portions of each vessel. Bilateral testing is considered an integral part of a complete examination. Limited examinations for reoccurring indications may be performed as noted.  Right Carotid Findings: +----------+--------+--------+--------+--------+------------------+           PSV cm/sEDV cm/sStenosisDescribeComments           +----------+--------+--------+--------+--------+------------------+ CCA Prox  85      12                                         +----------+--------+--------+--------+--------+------------------+ CCA Distal92      17                      intimal thickening +----------+--------+--------+--------+--------+------------------+ ICA Prox  87      26      1-39%   calcific                   +----------+--------+--------+--------+--------+------------------+ ICA Distal32      18                      tortuous           +----------+--------+--------+--------+--------+------------------+ ECA       96      11                                         +----------+--------+--------+--------+--------+------------------+ +----------+--------+-------+----------------+------------+           PSV cm/sEDV cmsDescribe        Arm Pressure +----------+--------+-------+----------------+------------+ Subclavian67             Multiphasic, WNL             +----------+--------+-------+----------------+------------+ +---------+--------+--+--------+-+---------+ VertebralPSV cm/s30EDV cm/s8Antegrade +---------+--------+--+--------+-+---------+ Left Carotid Findings: +----------+--------+--------+--------+--------+------------------+           PSV cm/sEDV cm/sStenosisDescribeComments           +----------+--------+--------+--------+--------+------------------+ CCA Prox  109     11                      intimal thickening  +----------+--------+--------+--------+--------+------------------+ CCA Distal83      14  unchanged. Right internal jugular Swan-Ganz catheter is unchanged. Bilateral chest tubes are noted without pneumothorax. Left ventricular assist device is unchanged. Left-sided defibrillator is again noted. Minimal bibasilar subsegmental atelectasis is noted. IMPRESSION: Stable support apparatus as noted above. Minimal bibasilar subsegmental atelectasis. Electronically Signed   By: Lupita Raider M.D.   On: 03/09/2023 10:35   DG Chest Port 1 View  Result Date: 03/08/2023 CLINICAL DATA:  098119 LVAD (left ventricular assist device) present (HCC) 147829. EXAM: PORTABLE CHEST 1 VIEW COMPARISON:   03/07/2023. FINDINGS: Interval median sternotomy and placement of a left ventricular assist device. Endotracheal tube tip projects over the midthoracic trachea. Unchanged Left chest AICD/pacemaker with leads projecting over the right atrium and ventricle. Unchanged right IJ approach pulmonary arterial catheter with tip projecting over the right main pulmonary artery. Pleural mediastinal chest tubes are in place. Enteric tube side port projects over the lower esophagus. Interval advancement is recommended. Left basilar atelectasis. No consolidation or pulmonary edema. No pleural effusion or pneumothorax. IMPRESSION: 1. Interval median sternotomy and placement of a left ventricular assist device. 2. Enteric tube side port projects over the lower esophagus. Interval advancement is recommended. Electronically Signed   By: Orvan Falconer M.D.   On: 03/08/2023 15:31   ECHO INTRAOPERATIVE TEE  Result Date: 03/08/2023  *INTRAOPERATIVE TRANSESOPHAGEAL REPORT *  Patient Name:   Steven [name] [facility]   Date of Exam: 03/08/2023 Medical Rec #:  [id number]      Height:       69.0 in Accession #:    [id number]     Weight:       155.6 lb Date of Birth:  23-Apr-1956      BSA:          1.86 m Patient Age:    67 years       BP:           108/67 mmHg Patient Gender: M              HR:           71 bpm. Exam Location:  Anesthesiology Transesophogeal exam was perform intraoperatively during surgical procedure. Patient was closely monitored under general anesthesia during the entirety of examination. Indications:     CAD; CHF Performing Phys: Anice Paganini DO Complications: No known complications during this procedure. POST-OP IMPRESSIONS _ Left Ventricle: Left ventricular function remains severely reduced post bypass. LVAD inflow cannula is visualized at LV apex, directed towards the mitral valve. _ Right Ventricle: Right ventricular function is normal on inotropic support. _ Aorta: The aorta appears unchanged from pre-bypass. There is no  dissection present in the aorta. _ Left Atrial Appendage: The left atrial appendage appears unchanged from pre-bypass. _ Aortic Valve: The aortic valve appears unchanged from pre-bypass. The valve is not visualized opening with LV contraction following initiation of LVAD. _ Mitral Valve: No regurgitation post repair. _ Tricuspid Valve: There is mild regurgitation. _ Pulmonic Valve: The pulmonic valve appears unchanged from pre-bypass. _ Interatrial Septum: Remains midline on 5200RPMs. _ Interventricular Septum: Remains midline on 5200RPMs. PRE-OP FINDINGS  Left Ventricle: The left ventricle has severely reduced systolic function, with an ejection fraction of 14.6% by Simpsons biplane method of discs. No evidence of LV thrombus. Right Ventricle: The right ventricle has normal systolic function. The cavity was normal. There is no increase in right ventricular wall thickness. FAC 42.8%. TAPSE 1.8cm Left Atrium: No left atrial/left atrial appendage thrombus was detected. Right Atrium: Right atrial size was normal in  fossa.  *See table(s) above for measurements and observations. Electronically signed by Heath Lark on 03/04/2023 at 10:52:37 AM.    Final    VAS US DOPPLER PRE VAD  Result Date: 03/03/2023 PERIOPERATIVE VASCULAR EVALUATION Patient Name:  Steven [name] [facility]  Date of Exam:   03/02/2023 Medical Rec #: [id number]     Accession #:    [id number] Date of Birth: May 19, 1956     Patient Gender: M Patient Age:   55 years Exam Location:  [facility] Procedure:      VAS US DOPPLER PRE VAD Referring Phys: Reuel Boom BENSIMHON --------------------------------------------------------------------------------  Indications:      Pre-VAD. Risk Factors:     Hypertension, hyperlipidemia, past history of smoking, prior                   MI, coronary artery disease. Other Factors:    AF, CKD, CHF, ICD. Comparison Study: No previous exams Performing Technologist: Jean Rosenthal RDMS, RVT Supporting Technologist: Ernestene Mention RVT, RDMS  Examination Guidelines: A complete evaluation includes B-mode imaging, spectral  Doppler, color Doppler, and power Doppler as needed of all accessible portions of each vessel. Bilateral testing is considered an integral part of a complete examination. Limited examinations for reoccurring indications may be performed as noted.  Right Carotid Findings: +----------+--------+--------+--------+--------+------------------+           PSV cm/sEDV cm/sStenosisDescribeComments           +----------+--------+--------+--------+--------+------------------+ CCA Prox  85      12                                         +----------+--------+--------+--------+--------+------------------+ CCA Distal92      17                      intimal thickening +----------+--------+--------+--------+--------+------------------+ ICA Prox  87      26      1-39%   calcific                   +----------+--------+--------+--------+--------+------------------+ ICA Distal32      18                      tortuous           +----------+--------+--------+--------+--------+------------------+ ECA       96      11                                         +----------+--------+--------+--------+--------+------------------+ +----------+--------+-------+----------------+------------+           PSV cm/sEDV cmsDescribe        Arm Pressure +----------+--------+-------+----------------+------------+ Subclavian67             Multiphasic, WNL             +----------+--------+-------+----------------+------------+ +---------+--------+--+--------+-+---------+ VertebralPSV cm/s30EDV cm/s8Antegrade +---------+--------+--+--------+-+---------+ Left Carotid Findings: +----------+--------+--------+--------+--------+------------------+           PSV cm/sEDV cm/sStenosisDescribeComments           +----------+--------+--------+--------+--------+------------------+ CCA Prox  109     11                      intimal thickening  +----------+--------+--------+--------+--------+------------------+ CCA Distal83      14  unchanged. Right internal jugular Swan-Ganz catheter is unchanged. Bilateral chest tubes are noted without pneumothorax. Left ventricular assist device is unchanged. Left-sided defibrillator is again noted. Minimal bibasilar subsegmental atelectasis is noted. IMPRESSION: Stable support apparatus as noted above. Minimal bibasilar subsegmental atelectasis. Electronically Signed   By: Lupita Raider M.D.   On: 03/09/2023 10:35   DG Chest Port 1 View  Result Date: 03/08/2023 CLINICAL DATA:  098119 LVAD (left ventricular assist device) present (HCC) [id number]. EXAM: PORTABLE CHEST 1 VIEW COMPARISON:   03/07/2023. FINDINGS: Interval median sternotomy and placement of a left ventricular assist device. Endotracheal tube tip projects over the midthoracic trachea. Unchanged Left chest AICD/pacemaker with leads projecting over the right atrium and ventricle. Unchanged right IJ approach pulmonary arterial catheter with tip projecting over the right main pulmonary artery. Pleural mediastinal chest tubes are in place. Enteric tube side port projects over the lower esophagus. Interval advancement is recommended. Left basilar atelectasis. No consolidation or pulmonary edema. No pleural effusion or pneumothorax. IMPRESSION: 1. Interval median sternotomy and placement of a left ventricular assist device. 2. Enteric tube side port projects over the lower esophagus. Interval advancement is recommended. Electronically Signed   By: Orvan Falconer M.D.   On: 03/08/2023 15:31   ECHO INTRAOPERATIVE TEE  Result Date: 03/08/2023  *INTRAOPERATIVE TRANSESOPHAGEAL REPORT *  Patient Name:   Steven [name] [facility]   Date of Exam: 03/08/2023 Medical Rec #:  [id number]      Height:       69.0 in Accession #:    [id number]     Weight:       155.6 lb Date of Birth:  23-Apr-1956      BSA:          1.86 m Patient Age:    67 years       BP:           108/67 mmHg Patient Gender: M              HR:           71 bpm. Exam Location:  Anesthesiology Transesophogeal exam was perform intraoperatively during surgical procedure. Patient was closely monitored under general anesthesia during the entirety of examination. Indications:     CAD; CHF Performing Phys: Anice Paganini DO Complications: No known complications during this procedure. POST-OP IMPRESSIONS _ Left Ventricle: Left ventricular function remains severely reduced post bypass. LVAD inflow cannula is visualized at LV apex, directed towards the mitral valve. _ Right Ventricle: Right ventricular function is normal on inotropic support. _ Aorta: The aorta appears unchanged from pre-bypass. There is no  dissection present in the aorta. _ Left Atrial Appendage: The left atrial appendage appears unchanged from pre-bypass. _ Aortic Valve: The aortic valve appears unchanged from pre-bypass. The valve is not visualized opening with LV contraction following initiation of LVAD. _ Mitral Valve: No regurgitation post repair. _ Tricuspid Valve: There is mild regurgitation. _ Pulmonic Valve: The pulmonic valve appears unchanged from pre-bypass. _ Interatrial Septum: Remains midline on 5200RPMs. _ Interventricular Septum: Remains midline on 5200RPMs. PRE-OP FINDINGS  Left Ventricle: The left ventricle has severely reduced systolic function, with an ejection fraction of 14.6% by Simpsons biplane method of discs. No evidence of LV thrombus. Right Ventricle: The right ventricle has normal systolic function. The cavity was normal. There is no increase in right ventricular wall thickness. FAC 42.8%. TAPSE 1.8cm Left Atrium: No left atrial/left atrial appendage thrombus was detected. Right Atrium: Right atrial size was normal in  fossa.  *See table(s) above for measurements and observations. Electronically signed by Heath Lark on 03/04/2023 at 10:52:37 AM.    Final    VAS US DOPPLER PRE VAD  Result Date: 03/03/2023 PERIOPERATIVE VASCULAR EVALUATION Patient Name:  Steven [name] [facility]  Date of Exam:   03/02/2023 Medical Rec #: [id number]     Accession #:    [id number] Date of Birth: May 19, 1956     Patient Gender: M Patient Age:   55 years Exam Location:  [facility] Procedure:      VAS US DOPPLER PRE VAD Referring Phys: Reuel Boom BENSIMHON --------------------------------------------------------------------------------  Indications:      Pre-VAD. Risk Factors:     Hypertension, hyperlipidemia, past history of smoking, prior                   MI, coronary artery disease. Other Factors:    AF, CKD, CHF, ICD. Comparison Study: No previous exams Performing Technologist: Jean Rosenthal RDMS, RVT Supporting Technologist: Ernestene Mention RVT, RDMS  Examination Guidelines: A complete evaluation includes B-mode imaging, spectral  Doppler, color Doppler, and power Doppler as needed of all accessible portions of each vessel. Bilateral testing is considered an integral part of a complete examination. Limited examinations for reoccurring indications may be performed as noted.  Right Carotid Findings: +----------+--------+--------+--------+--------+------------------+           PSV cm/sEDV cm/sStenosisDescribeComments           +----------+--------+--------+--------+--------+------------------+ CCA Prox  85      12                                         +----------+--------+--------+--------+--------+------------------+ CCA Distal92      17                      intimal thickening +----------+--------+--------+--------+--------+------------------+ ICA Prox  87      26      1-39%   calcific                   +----------+--------+--------+--------+--------+------------------+ ICA Distal32      18                      tortuous           +----------+--------+--------+--------+--------+------------------+ ECA       96      11                                         +----------+--------+--------+--------+--------+------------------+ +----------+--------+-------+----------------+------------+           PSV cm/sEDV cmsDescribe        Arm Pressure +----------+--------+-------+----------------+------------+ Subclavian67             Multiphasic, WNL             +----------+--------+-------+----------------+------------+ +---------+--------+--+--------+-+---------+ VertebralPSV cm/s30EDV cm/s8Antegrade +---------+--------+--+--------+-+---------+ Left Carotid Findings: +----------+--------+--------+--------+--------+------------------+           PSV cm/sEDV cm/sStenosisDescribeComments           +----------+--------+--------+--------+--------+------------------+ CCA Prox  109     11                      intimal thickening  +----------+--------+--------+--------+--------+------------------+ CCA Distal83      14  fossa.  *See table(s) above for measurements and observations. Electronically signed by Heath Lark on 03/04/2023 at 10:52:37 AM.    Final    VAS US DOPPLER PRE VAD  Result Date: 03/03/2023 PERIOPERATIVE VASCULAR EVALUATION Patient Name:  Steven [name] [facility]  Date of Exam:   03/02/2023 Medical Rec #: [id number]     Accession #:    [id number] Date of Birth: May 19, 1956     Patient Gender: M Patient Age:   55 years Exam Location:  [facility] Procedure:      VAS US DOPPLER PRE VAD Referring Phys: Reuel Boom BENSIMHON --------------------------------------------------------------------------------  Indications:      Pre-VAD. Risk Factors:     Hypertension, hyperlipidemia, past history of smoking, prior                   MI, coronary artery disease. Other Factors:    AF, CKD, CHF, ICD. Comparison Study: No previous exams Performing Technologist: Jean Rosenthal RDMS, RVT Supporting Technologist: Ernestene Mention RVT, RDMS  Examination Guidelines: A complete evaluation includes B-mode imaging, spectral  Doppler, color Doppler, and power Doppler as needed of all accessible portions of each vessel. Bilateral testing is considered an integral part of a complete examination. Limited examinations for reoccurring indications may be performed as noted.  Right Carotid Findings: +----------+--------+--------+--------+--------+------------------+           PSV cm/sEDV cm/sStenosisDescribeComments           +----------+--------+--------+--------+--------+------------------+ CCA Prox  85      12                                         +----------+--------+--------+--------+--------+------------------+ CCA Distal92      17                      intimal thickening +----------+--------+--------+--------+--------+------------------+ ICA Prox  87      26      1-39%   calcific                   +----------+--------+--------+--------+--------+------------------+ ICA Distal32      18                      tortuous           +----------+--------+--------+--------+--------+------------------+ ECA       96      11                                         +----------+--------+--------+--------+--------+------------------+ +----------+--------+-------+----------------+------------+           PSV cm/sEDV cmsDescribe        Arm Pressure +----------+--------+-------+----------------+------------+ Subclavian67             Multiphasic, WNL             +----------+--------+-------+----------------+------------+ +---------+--------+--+--------+-+---------+ VertebralPSV cm/s30EDV cm/s8Antegrade +---------+--------+--+--------+-+---------+ Left Carotid Findings: +----------+--------+--------+--------+--------+------------------+           PSV cm/sEDV cm/sStenosisDescribeComments           +----------+--------+--------+--------+--------+------------------+ CCA Prox  109     11                      intimal thickening  +----------+--------+--------+--------+--------+------------------+ CCA Distal83      14  Physician Discharge Summary  Patient ID: Steven [name] MRN: [id number] DOB/AGE: [birth year] 67 y.o.  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 The heart is enlarged with dense pericardial calcifications, unchanged. The upper mediastinal contours are normal. The right pleural effusion has essentially resolved, with improved aeration of the lung base. There is a probable small residual left pleural effusion with mild adjacent airspace opacities, unchanged. There is no new or worsening focal airspace opacity. There is no overt pulmonary edema. There is no pneumothorax There is no acute osseous abnormality. IMPRESSION: 1. Probable small residual left pleural effusion and mild adjacent airspace opacity likely reflecting atelectasis. 2. Essentially resolved right pleural effusion. Electronically Signed   By: Lesia Hausen M.D.   On: 03/20/2023 11:47   DG CHEST PORT 1 VIEW  Result Date: 03/16/2023 CLINICAL DATA:  LVAD present.  Chest pain with cough. EXAM: PORTABLE CHEST 1 VIEW COMPARISON:  Radiograph 03/11/2023 FINDINGS: LVAD in place. Left-sided pacemaker in place. Right-sided central line is stable in positioning. Stable cardiomegaly post CABG. Coarse pericardial calcifications again seen. Improvement in bibasilar airspace disease and pleural effusions. No  pneumothorax. IMPRESSION: 1. Improvement in bibasilar airspace disease and pleural effusions. 2. Stable cardiomegaly post CABG. LVAD in place. Electronically Signed   By: Narda Rutherford M.D.   On: 03/16/2023 09:47   ECHOCARDIOGRAM LIMITED  Result Date: 03/13/2023    ECHOCARDIOGRAM LIMITED REPORT   Patient Name:   Steven [name] [facility] Date of Exam: 03/13/2023 Medical Rec #:  [id number]    Height:       69.0 in Accession #:    [id number]   Weight:       153.4 lb Date of Birth:  Oct 23, 1955    BSA:          1.846 m Patient Age:    67 years     BP:           0/0 mmHg Patient Gender: M            HR:           102 bpm. Exam Location:  Inpatient Procedure: Limited Echo, Cardiac Doppler and Color Doppler Indications:    RAMP LVAD Evaluation  History:        Patient has prior history of Echocardiogram examinations, most                 recent 03/08/2023. CHF, CAD, Defibrillator; Risk                 Factors:Hypertension, Dyslipidemia and ETOH. HMIII LVAD                 Implanted 03/08/23.  Sonographer:    Irving Burton Senior RDCS Referring Phys: [phone] [facility]  Sonographer Comments: Ramp echo with Dr Shirlee Latch at bedside IMPRESSIONS  1. LVAD inflow cannula noted at apex. Left ventricular ejection fraction, by estimation, is <20%. The left ventricle has severely decreased function. The left ventricle demonstrates global hypokinesis.  Physician Discharge Summary  Patient ID: Steven [name] MRN: [id number] DOB/AGE: [birth year] 67 y.o.  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 The heart is enlarged with dense pericardial calcifications, unchanged. The upper mediastinal contours are normal. The right pleural effusion has essentially resolved, with improved aeration of the lung base. There is a probable small residual left pleural effusion with mild adjacent airspace opacities, unchanged. There is no new or worsening focal airspace opacity. There is no overt pulmonary edema. There is no pneumothorax There is no acute osseous abnormality. IMPRESSION: 1. Probable small residual left pleural effusion and mild adjacent airspace opacity likely reflecting atelectasis. 2. Essentially resolved right pleural effusion. Electronically Signed   By: Lesia Hausen M.D.   On: 03/20/2023 11:47   DG CHEST PORT 1 VIEW  Result Date: 03/16/2023 CLINICAL DATA:  LVAD present.  Chest pain with cough. EXAM: PORTABLE CHEST 1 VIEW COMPARISON:  Radiograph 03/11/2023 FINDINGS: LVAD in place. Left-sided pacemaker in place. Right-sided central line is stable in positioning. Stable cardiomegaly post CABG. Coarse pericardial calcifications again seen. Improvement in bibasilar airspace disease and pleural effusions. No  pneumothorax. IMPRESSION: 1. Improvement in bibasilar airspace disease and pleural effusions. 2. Stable cardiomegaly post CABG. LVAD in place. Electronically Signed   By: Narda Rutherford M.D.   On: 03/16/2023 09:47   ECHOCARDIOGRAM LIMITED  Result Date: 03/13/2023    ECHOCARDIOGRAM LIMITED REPORT   Patient Name:   Steven [name] [facility] Date of Exam: 03/13/2023 Medical Rec #:  [id number]    Height:       69.0 in Accession #:    [id number]   Weight:       153.4 lb Date of Birth:  Oct 23, 1955    BSA:          1.846 m Patient Age:    67 years     BP:           0/0 mmHg Patient Gender: M            HR:           102 bpm. Exam Location:  Inpatient Procedure: Limited Echo, Cardiac Doppler and Color Doppler Indications:    RAMP LVAD Evaluation  History:        Patient has prior history of Echocardiogram examinations, most                 recent 03/08/2023. CHF, CAD, Defibrillator; Risk                 Factors:Hypertension, Dyslipidemia and ETOH. HMIII LVAD                 Implanted 03/08/23.  Sonographer:    Irving Burton Senior RDCS Referring Phys: [phone] [facility]  Sonographer Comments: Ramp echo with Dr Shirlee Latch at bedside IMPRESSIONS  1. LVAD inflow cannula noted at apex. Left ventricular ejection fraction, by estimation, is <20%. The left ventricle has severely decreased function. The left ventricle demonstrates global hypokinesis.

## 2023-03-21 NOTE — Progress Notes (Signed)
Inpatient Rehabilitation Admission Medication Review by a Pharmacist  A complete drug regimen review was completed for this patient to identify any potential clinically significant medication issues.  High Risk Drug Classes Is patient taking? Indication by Medication  Antipsychotic Yes Trazodone - sleep  Anticoagulant Yes Warfarin - LV thrombus   Antibiotic Yes Ceftriaxone - uti   Opioid Yes Oxycodone - pain  Tramadal - pain  Antiplatelet Yes Aspirin - previous stemi  Hypoglycemics/insulin No   Vasoactive Medication Yes Amiodarone - atrial fibrillation Losartan - HTN Spironolactone - HTN  Chemotherapy No   Other Yes Lipitor - HLD Bisacodyl - constipation  Colace - constipation  Protonix - GERD Miralax - constipation  Tamsulosin - BPH Finasteride - BPH Guaifenesin  - congestion     Type of Medication Issue Identified Description of Issue Recommendation(s)  Drug Interaction(s) (clinically significant)     Duplicate Therapy     Allergy     No Medication Administration End Date     Incorrect Dose     Additional Drug Therapy Needed     Significant med changes from prior encounter (inform family/care partners about these prior to discharge). Farxiga, zenprep, entresto, torsemide  Several prior to admission medications were not resumed inpatient. The heart failure team is following and will adjust medications as appropriate.   Other       Clinically significant medication issues were identified that warrant physician communication and completion of prescribed/recommended actions by midnight of the next day:  Yes  Name of provider notified for urgent issues identified: The heart failure team (Dr. Milas Kocher) has decided to stop zenprep and continues to monitor the patient for medication optimization.   Provider Method of Notification: secure chat   Time spent performing this drug regimen review (minutes):  30 minutes   Toniann Fail Elnora Quizon 03/21/2023 7:30 AM

## 2023-03-21 NOTE — Patient Care Conference (Signed)
Inpatient RehabilitationTeam Conference and Plan of Care Update Date: 03/21/2023   Time: 12:02 PM    Patient Name: Steven [name]      Medical Record Number: [id number]  Date of Birth: 01-11-1956 Sex: Male         Room/Bed: 4W24C/4W24C-01 Payor Info: Payor: Advertising copywriter MEDICARE / Plan: Suan Halter DUAL COMPLETE / Product Type: *No Product type* /    Admit Date/Time:  03/20/2023  4:00 PM  Primary Diagnosis:  Debility  Hospital Problems: Principal Problem:   Debility    Expected Discharge Date: Expected Discharge Date: 03/27/23  Team Members Present: Physician leading conference: Dr. Fanny Dance Social Worker Present: Dossie Der, LCSW Nurse Present: Chana Bode, RN PT Present: Ambrose Finland, PT OT Present: Valetta Fuller, OT SLP Present: Feliberto Gottron, SLP PPS Coordinator present : Edson Snowball, PT     Current Status/Progress Goal Weekly Team Focus  Bowel/Bladder      Urinary retention; on flomax and proscar. Constipation addressed; with miralax and colace    Continent of bladder and bowel management    Assess need for and effectiveness of medications with prompted toileting  Swallow/Nutrition/ Hydration               ADL's   min A LB selfcare sinkside, set up UB, CGA amb in room with HHA   mod I simple self care and ADL mob   progress activity tolerance and LVAD indep    Mobility   CGA/min A with no AD   mod I/Sup  ELOS 7 days, improving independence with LVAD, pt mother in law will be available to provide assistance for 2 weeks then will be    Communication                Safety/Cognition/ Behavioral Observations               Pain      Pain managed with prns    Pain < 4 with prns    Assess need for and effectiveness of prn meds  Skin     Right groin puncture; midline chest incision   Drive line intact; site clear of s/s of infection/injury, incision healing     Assess skin q shift     Discharge Planning:  new evaluation  according to pt his daughter's Mom will be there with him a short time. He wants to be mod/i by discharge.   Team Discussion: Patient post LVAD placement with debility; multi medical issues.  Progress limited by awareness deficits; patient has difficulty connecting wall to battery power.   Patient on target to meet rehab goals: yes, currently needs min assist for lower body care and set up for upper body care. Needs CGA for ambulation without an assistive device. Goals for discharge set for mod I - supervision overall.  *See Care Plan and progress notes for long and short-term goals.   Revisions to Treatment Plan:  N/a   Teaching Needs: Safety, skin care/drive line dressing, medications, dietary modification, transfers, toileting, etc. Sternal precautions, etc.  Current Barriers to Discharge: Decreased caregiver support, Home enviroment access/layout, and Wound care  Possible Resolutions to Barriers: Family education HH follow up services     Medical Summary Current Status: debility, HF, LVAD, CKD, ABLA, prediabetes, Hyponatremia  Barriers to Discharge: Cardiac Complications;Medical stability;Renal Insufficiency/Failure  Barriers to Discharge Comments: debility, HF, LVAD, CKD, ABLA, prediabetes, Hyponatremia Possible Resolutions to [employer] Focus: LVAD care/training, monitor BMP, monitor HR, monitor wts   Continued Need for  Acute Rehabilitation Level of Care: The patient requires daily medical management by a physician with specialized training in physical medicine and rehabilitation for the following reasons: Direction of a multidisciplinary physical rehabilitation program to maximize functional independence : Yes Medical management of patient stability for increased activity during participation in an intensive rehabilitation regime.: Yes Analysis of laboratory values and/or radiology reports with any subsequent need for medication adjustment and/or medical intervention. :  Yes   I attest that I was present, lead the team conference, and concur with the assessment and plan of the team.   Chana Bode B 03/21/2023, 4:26 PM

## 2023-03-21 NOTE — Progress Notes (Signed)
Inpatient Rehabilitation Care Coordinator Assessment and Plan Patient Details  Name: Steven [name] MRN: [id number] Date of Birth: 1955-09-18  Today's Date: 03/21/2023  Hospital Problems: Principal Problem:   Debility  Past Medical History:  Past Medical History:  Diagnosis Date   Acute MI, anterior wall (HCC)    AICD (automatic cardioverter/defibrillator) present    CAD (coronary artery disease)    2D ECHO, 07/13/2011 - EF <25%, LV moderatelty dilated, LA moderately dilatedLEXISCAN, 12/14/2011 - moderate-severe perfusion defect seen in the basal anteroseptal, mid anterior, apicacl anterior, apical, apical inferior, and apical lateral regions, post-stress EF 25%, new EKG changes from baseline abnormalities   Cancer [facility]    Prostate   CHF (congestive heart failure) (HCC) 2012   Hypertension 08/08/2021   Inguinal hernia, left    Pneumonia    November 2023   Pre-diabetes    Past Surgical History:  Past Surgical History:  Procedure Laterality Date   CARDIAC CATHETERIZATION  11/25/2010   Predilation balloon-Apex monorail 2x90mm, Cutting balloon-2.25x27mm, resulting in a reduction of 100% stenosis down to less than 10%   CARDIAC CATHETERIZATION  07/12/2010   LAD stented with a 3.5x13mm bare-metal Veriflex stent resulting in a reduction of 100% lesion to 0%   CARDIAC DEFIBRILLATOR PLACEMENT  03/02/2011   Medtronic Protecta XT DR, model #N829FAO, serial #[id number] H   COLONOSCOPY     GOLD SEED IMPLANT N/A 07/21/2021   Procedure: GOLD SEED IMPLANT;  Surgeon: Heloise Purpura, MD;  Location: [facility] ORS;  Service: Urology;  Laterality: N/A;  NEEDS 30 MIN   HERNIA REPAIR  2018   ICD GENERATOR CHANGEOUT N/A 02/24/2019   Procedure: ICD GENERATOR CHANGEOUT;  Surgeon: Thurmon Fair, MD;  Location: [facility] INVASIVE CV [facility];  Service: Cardiovascular;  Laterality: N/A;   INSERTION OF IMPLANTABLE LEFT VENTRICULAR ASSIST DEVICE N/A 03/08/2023   Procedure: INSERTION OF IMPLANTABLE LEFT VENTRICULAR ASSIST  DEVICE;  Surgeon: Alleen Borne, MD;  Location: [facility] OR;  Service: Open Heart Surgery;  Laterality: N/A;   PACEMAKER PLACEMENT  07/14/2010   Temporary placement of pacemaker, if rhythm issue continues will need a permanent device   RIGHT HEART CATH N/A 02/28/2023   Procedure: RIGHT HEART CATH;  Surgeon: Iran Ouch, MD;  Location: [facility] INV[facility] CV [facility];  Service: Cardiovascular;  Laterality: N/A;   RIGHT HEART CATH N/A 03/06/2023   Procedure: RIGHT HEART CATH;  Surgeon: Dorthula Nettles, DO;  Location: [facility];  Service: Cardiovascular;  Laterality: N/A;   SPACE OAR INSTILLATION N/A 07/21/2021   Procedure: SPACE OAR INSTILLATION;  Surgeon: Heloise Purpura, MD;  Location: [facility];  Service: Urology;  Laterality: N/A;   TEE WITHOUT CARDIOVERSION N/A 03/08/2023   Procedure: TRANSESOPHAGEAL ECHOCARDIOGRAM;  Surgeon: Alleen Borne, MD;  Location: [facility] OR;  Service: Open Heart Surgery;  Laterality: N/A;   Social History:  reports that he quit smoking about 12 years ago. His smoking use included cigarettes and cigars. He has never used smokeless tobacco. He reports current alcohol use of about 2.0 - 3.0 standard drinks of alcohol per week. He reports that he does not use drugs.  Family / Support Systems Marital Status: Single Patient Roles: Parent, Other (Comment) (friend) Children: joria-daughter [phone] Other Supports: Suzette Battiest [phone] daughter's mother Anticipated Caregiver: Suzette Battiest and Tobey Bride Ability/Limitations of Caregiver: Pt reports none will confirm with [name] Caregiver Availability: 24/7 Family Dynamics: Close with daughter and have always gotten along with [name] and she will be assisting him at discharge. He wants to be mod/i when he  goes home.  Social History Preferred language: English Religion: None Cultural Background: No issues Education: HS Health Literacy - How often do you need to have someone help you when you read instructions, pamphlets, or other written  material from your doctor or pharmacy?: Never Writes: Yes Employment Status: Retired Marine scientist Issues: No issues Guardian/Conservator: None-according to MD pt is capable of making his own decisions while here.   Abuse/Neglect Abuse/Neglect Assessment Can Be Completed: Yes Physical Abuse: Denies Verbal Abuse: Denies Sexual Abuse: Denies Exploitation of patient/patient's resources: Denies Self-Neglect: Denies  Patient response to: Social Isolation - How often do you feel lonely or isolated from those around you?: Rarely  Emotional Status Pt's affect, behavior and adjustment status: Pt is motivated to recover and get back to his independent level. He is one who is used to doing for himself and not one to depend upon others. He is weak but feels like he will make the goals he wants to meet Recent Psychosocial Issues: other health issues Psychiatric History: No history with all he has been through he may benefit from seeing neuro-psych while here. He is very stoic and a man of few words. Substance Abuse History: No issues  Patient / Family Perceptions, Expectations & Goals Pt/Family understanding of illness & functional limitations: Pt can explain his health issues and need for LVAD he is still learning about it and hopes to be able to be independent. He does talk with the MD's involved and feels has a good understanding of his treatment lan moving forward. Premorbid pt/family roles/activities: fahter, retiree, neighbor, friend Anticipated changes in roles/activities/participation: resume Pt/family expectations/goals: Pt states: " I hope to do well here and not need help when I leave here."  [employer]: None Premorbid Home Care/DME Agencies: None Transportation available at discharge: self daughter drives Is the patient able to respond to transportation needs?: Yes In the past 12 months, has lack of transportation kept you from medical  appointments or from getting medications?: No In the past 12 months, has lack of transportation kept you from meetings, work, or from getting things needed for daily living?: No Resource referrals recommended: Neuropsychology  Discharge Planning Living Arrangements: Alone Support Systems: Children, Manufacturing engineer, Other relatives Type of Residence: Private residence Insurance Resources: Media planner (specify) Investment banker, operational) Financial Resources: Restaurant manager, fast food Screen Referred: No Living Expenses: Rent Money Management: Patient Does the patient have any problems obtaining your medications?: No Home Management: self Patient/Family Preliminary Plans: Return home with daughter's Mom assisting with his care hopefully for a short time. Thier daughter will be checking on him also. Will await therapy evaluations and work on discharge needs.  Clinical Impression Pleasant quiet gentleman who is motivated to do well here and recover from his surgery. Still learning about the LVAD and will have assist short term by his daughter's mother. Await team's evaluations and work on discharge needs. Have placed on neuro-psych list  Lucy Chris 03/21/2023, 9:33 AM

## 2023-03-21 NOTE — Progress Notes (Signed)
LVAD Coordinator Rounding Note:  Admitted 02/26/23 due to CHF.   HM3 LVAD implanted on 03/08/23 by Dr Laneta Simmers under DT criteria.  9/12: HM3 LVAD Implant. Intra-op TEE LVEF 15%, RV normal  9/13: Extubated.  Driveline pulled back, 1 inch of Velour exposed.  9/14: Went into atrial fibrillation with RVR,  IV amiodarone started.  9/15: Hematuria.  9/17: Urology consulted for hematuria. Proscar added. Given 1 unit of blood. Ramp Echo speed increased to 5500. 03/20/23: Discharged to CIR  Pt sitting up in recliner on my arrival. Denies complaints. Has productive cough. Encouraged to be OOB as much as possible, and to utilizes IS hourly. He verbalized understanding.   Spoke with pt's caregiver Suzette Battiest this morning regarding plan for education/dressing change teaching. She will plan to come for dressing change observation tomorrow pending pt's therapy schedule.   Vital signs: Temp: 97.7 HR: 80 paced Doppler Pressure: 75 Auto BP: 99/65 (79) O2 Sat: 100% on RA Wt: 155.6>169.5>162>153.4>159.3>153.4>151.9>148.8>149.7>150.1 lbs    LVAD interrogation reveals:  Speed: 5500 Flow: 4.1 Power: 3.9 w  PI: 2.2  Alarms: none  Events:  60+ PI events so far today Hematocrit: 29  Fixed speed: 5500 Low speed limit: 5200   Drive Line:  Existing VAD dressing removed and site care performed using sterile technique. Drive line exit site cleaned with Chlora prep applicators x 2, allowed to dry, and gauze dressing with Silver strip applied. There is a single suture present. The driveline has been pulled out approx 1". Exit site unincorporated, the velour is now exposed approx 1" at exit site. No drainage, redness, tenderness, foul odor or rash noted. Drive line anchor re-applied. Pt denies fever or chills. Continue Monday-Wednesday-Friday dressing changes. Next dressing change due 03/23/23 by nurse champion or VAD coordinator.   Will plan to have Veronica observe dressing change tomorrow based off her  availability.     Labs:  LDH trend: 234>310>274>302>318>306>285>310>386  INR trend: 1.6>1.5>1.6>1.5>1.7>2.3>2.6>2.3>2.5  Anticoagulation Plan: -INR Goal: 2-2.5 -ASA Dose: 81 mg   Blood Products:  -Intra op: 03/08/23 1 PRBC 2 FFP 1 PLT 1 Cryo 450 cell saver  Post op: 03/08/23 1 PRBC 2 FFP 1 PLT 1 Cryo  03/09/23: 1 PRBC 03/13/23: 1 PRBC  Device: -Medtronic -Therapies: ON  -VF > 200BMP  Arrythmias: hx afib- currently paced 80  Respiratory: on RA  Infection:   Renal:  -CRT: 1.28>1.72>1.76>1.63>1.37>1.17>1.12>1.11  Patient Education: All discharge education completed at bedside 03/19/23 with pt. Please see separate note for details.  Adverse Events on VAD:  Plan/Recommendations:  1. Please page VAD coordinator with any equipment issues or driveline problems. 2. Continue MWF dressing changes using silver strip by VAD coordinator or nurse champion. Next dressing due 03/23/23.  Alyce Pagan RN VAD Coordinator  Office: [phone]  24/7 Pager: [phone]

## 2023-03-21 NOTE — Plan of Care (Signed)
  Problem: RH Balance Goal: LTG Patient will maintain dynamic sitting balance (PT) Description: LTG:  Patient will maintain dynamic sitting balance with assistance during mobility activities (PT) Flowsheets (Taken 03/21/2023 1247) LTG: Pt will maintain dynamic sitting balance during mobility activities with:: Independent with assistive device  Goal: LTG Patient will maintain dynamic standing balance (PT) Description: LTG:  Patient will maintain dynamic standing balance with assistance during mobility activities (PT) Flowsheets (Taken 03/21/2023 1247) LTG: Pt will maintain dynamic standing balance during mobility activities with:: Supervision/Verbal cueing   Problem: Sit to Stand Goal: LTG:  Patient will perform sit to stand with assistance level (PT) Description: LTG:  Patient will perform sit to stand with assistance level (PT) Flowsheets (Taken 03/21/2023 1247) LTG: PT will perform sit to stand in preparation for functional mobility with assistance level: Supervision/Verbal cueing   Problem: RH Bed Mobility Goal: LTG Patient will perform bed mobility with assist (PT) Description: LTG: Patient will perform bed mobility with assistance, with/without cues (PT). Flowsheets (Taken 03/21/2023 1247) LTG: Pt will perform bed mobility with assistance level of: Independent with assistive device    Problem: RH Bed to Chair Transfers Goal: LTG Patient will perform bed/chair transfers w/assist (PT) Description: LTG: Patient will perform bed to chair transfers with assistance (PT). Flowsheets (Taken 03/21/2023 1247) LTG: Pt will perform Bed to Chair Transfers with assistance level: Supervision/Verbal cueing   Problem: RH Car Transfers Goal: LTG Patient will perform car transfers with assist (PT) Description: LTG: Patient will perform car transfers with assistance (PT). Flowsheets (Taken 03/21/2023 1247) LTG: Pt will perform car transfers with assist:: Supervision/Verbal cueing   Problem: RH  Ambulation Goal: LTG Patient will ambulate in controlled environment (PT) Description: LTG: Patient will ambulate in a controlled environment, # of feet with assistance (PT). Flowsheets (Taken 03/21/2023 1247) LTG: Pt will ambulate in controlled environ  assist needed:: Supervision/Verbal cueing LTG: Ambulation distance in controlled environment: at least 167ft using LRAD Goal: LTG Patient will ambulate in home environment (PT) Description: LTG: Patient will ambulate in home environment, # of feet with assistance (PT). Flowsheets (Taken 03/21/2023 1247) LTG: Pt will ambulate in home environ  assist needed:: Supervision/Verbal cueing LTG: Ambulation distance in home environment: 20ft using LRAD   Problem: RH Stairs Goal: LTG Patient will ambulate up and down stairs w/assist (PT) Description: LTG: Patient will ambulate up and down # of stairs with assistance (PT) Flowsheets (Taken 03/21/2023 1247) LTG: Pt will ambulate up/down stairs assist needed:: Supervision/Verbal cueing LTG: Pt will  ambulate up and down number of stairs: 12 steps using R HR only per home set-up

## 2023-03-21 NOTE — Progress Notes (Signed)
PROGRESS NOTE   Subjective/Complaints: Reports pain is under control.  No new concerns noted this morning.  Review of systems: Denies chest pain at rest, has chest wall soreness when he coughs.  Baseline shortness of breath.   Denies abdominal pain, nausea, vomiting. + Leg swelling  Objective:   DG CHEST PORT 1 VIEW  Result Date: 03/20/2023 CLINICAL DATA:  Cough, leukocytosis EXAM: PORTABLE CHEST 1 VIEW COMPARISON:  Chest radiograph 03/16/2023 FINDINGS: The right upper extremity PICC terminating in the mid SVC, left chest wall cardiac device and associated leads, median sternotomy wires, and LVAD are unchanged. The heart is enlarged with dense pericardial calcifications, unchanged. The upper mediastinal contours are normal. The right pleural effusion has essentially resolved, with improved aeration of the lung base. There is a probable small residual left pleural effusion with mild adjacent airspace opacities, unchanged. There is no new or worsening focal airspace opacity. There is no overt pulmonary edema. There is no pneumothorax There is no acute osseous abnormality. IMPRESSION: 1. Probable small residual left pleural effusion and mild adjacent airspace opacity likely reflecting atelectasis. 2. Essentially resolved right pleural effusion. Electronically Signed   By: Lesia Hausen M.D.   On: 03/20/2023 11:47   Recent Labs    03/20/23 0548 03/21/23 0448  WBC 18.0* 15.4*  HGB 9.2* 9.2*  HCT 28.5* 29.0*  PLT 464* 528*   Recent Labs    03/20/23 0530 03/21/23 0448  NA 128* 131*  K 4.0 4.2  CL 98 99  CO2 21* 21*  GLUCOSE 109* 80  BUN 20 19  CREATININE 1.12 1.11  CALCIUM 7.9* 8.5*    Intake/Output Summary (Last 24 hours) at 03/21/2023 1244 Last data filed at 03/21/2023 0907 Gross per 24 hour  Intake 240 ml  Output 600 ml  Net -360 ml        Physical Exam: Vital Signs Blood pressure (!) 72/58, pulse 76, temperature 97.7  F (36.5 C), temperature source Oral, resp. rate 20, height 5\' 9"  (1.753 m), weight 68.1 kg, SpO2 100%.   General: NAD, chronically ill-appearing HEENT: Head is normocephalic, atraumatic, MMM Neck: Supple  Heart: LVAD hum Chest: Decreased breath sounds at bilateral bases, nonlabored breathing Abdomen: Soft, non-tender, non-distended, bowel sounds positive. Extremities: + B/L 1+ LE edema Psych: Pt's affect is a little flat.  Cooperative Skin: Clean and intact without signs of breakdown Neuro:   Patient is alert sitting up in bed.  Follows commands.  Oriented x 3. Normal language, speech. Functional memory and awareness. UE motor 4/5. LE motor 3+/5 HF, KE 4/5, ADF/PF 4+/5. No sensory deficits appreciated. Normal muscle tone.  Cranial nerves II through XII grossly intact. Musculoskeletal: No joint swelling noted   Assessment/Plan: 1. Functional deficits which require 3+ hours per day of interdisciplinary therapy in a comprehensive inpatient rehab setting. Physiatrist is providing close team supervision and 24 hour management of active medical problems listed below. Physiatrist and rehab team continue to assess barriers to discharge/monitor patient progress toward functional and medical goals  Care Tool:  Bathing              Bathing assist       Upper Body Dressing/Undressing Upper  body dressing        Upper body assist      Lower Body Dressing/Undressing Lower body dressing            Lower body assist       Toileting Toileting    Toileting assist       Transfers Chair/bed transfer  Transfers assist     Chair/bed transfer assist level: Minimal Assistance - Patient > 75%     Locomotion Ambulation   Ambulation assist      Assist level: Minimal Assistance - Patient > 75% Assistive device: No Device Max distance: 125ft   Walk 10 feet activity   Assist     Assist level: Minimal Assistance - Patient > 75% Assistive device: No Device   Walk  50 feet activity   Assist    Assist level: Minimal Assistance - Patient > 75% Assistive device: No Device    Walk 150 feet activity   Assist    Assist level: Minimal Assistance - Patient > 75% Assistive device: No Device    Walk 10 feet on uneven surface  activity   Assist Walk 10 feet on uneven surfaces activity did not occur: Safety/medical concerns         Wheelchair     Assist Is the patient using a wheelchair?: No             Wheelchair 50 feet with 2 turns activity    Assist            Wheelchair 150 feet activity     Assist          Blood pressure (!) 72/58, pulse 76, temperature 97.7 F (36.5 C), temperature source Oral, resp. rate 20, height 5\' 9"  (1.753 m), weight 68.1 kg, SpO2 100%.  Medical Problem List and Plan: 1. Functional deficits secondary to debility after heart failure/ LVAD implant 9/12.   -Sternal precautions             -patient may not yet shower             -ELOS/Goals: 7-10 days, mod I goals with PT/OT  -Continue CIR 2.  Antithrombotics: -DVT/anticoagulation:  Pharmaceutical: Coumadin             -antiplatelet therapy: Aspirin 81 mg daily 3. Pain Management: Oxycodone/tramadol as needed 4. Mood/Behavior/Sleep: N/A             -antipsychotic agents: N/A 5. Neuropsych/cognition: This patient is capable of making decisions on his own behalf. 6. Skin/Wound Care: Routine skin checks             -local care to drive site  7. Fluids/Electrolytes/Nutrition: encourage PO             -Hyponatremia, 9/25 stable at 131 continue to monitor 8.  Acute on chronic combined systolic and diastolic congestive heart failure.  Follow-up per heart failure team.  Continue Aldactone 12.5 mg daily as well as Entresto 24-26 mg twice daily             -daily weights  -9/25 weight a little lower today-monitor drain   Filed Weights   03/20/23 1608 03/21/23 1108  Weight: 71.5 kg 68.1 kg    9.  Acute blood loss anemia.  Follow-up  CBC 10.  Hematuria/UTI/persistent leukocytosis.  Placed on Proscar per urology services.  Complete empiric course of Rocephin.  -9/25 WBC down to 15.4. 11.  Hyperlipidemia.  Lipitor 12.  Prediabetes.  Hemoglobin A1c 6.6.  SSI 13.  CKD stage III a?  Follow-up chemistries             -9/25 creatinine stable at 1.11 14.  Atrial fibrillation.  Amiodarone 200 mg twice daily.  Continue Coumadin 15.  Transaminitis.   -Suspect cardiac related.  Stable from prior.  Avoid hepatotoxic medications if possible.  LOS: 1 days A FACE TO FACE EVALUATION WAS PERFORMED  Fanny Dance 03/21/2023, 12:44 PM

## 2023-03-21 NOTE — Plan of Care (Signed)
Problem: RH Balance Goal: LTG Patient will maintain dynamic standing with ADLs (OT) Description: LTG:  Patient will maintain dynamic standing balance with assist during activities of daily living (OT)  Flowsheets (Taken 03/21/2023 1604) LTG: Pt will maintain dynamic standing balance during ADLs with: Supervision/Verbal cueing   Problem: Sit to Stand Goal: LTG:  Patient will perform sit to stand in prep for activites of daily living with assistance level (OT) Description: LTG:  Patient will perform sit to stand in prep for activites of daily living with assistance level (OT) Flowsheets (Taken 03/21/2023 1604) LTG: PT will perform sit to stand in prep for activites of daily living with assistance level: Supervision/Verbal cueing   Problem: RH Grooming Goal: LTG Patient will perform grooming w/assist,cues/equip (OT) Description: LTG: Patient will perform grooming with assist, with/without cues using equipment (OT) Flowsheets (Taken 03/21/2023 1604) LTG: Pt will perform grooming with assistance level of: Independent with assistive device    Problem: RH Bathing Goal: LTG Patient will bathe all body parts with assist levels (OT) Description: LTG: Patient will bathe all body parts with assist levels (OT) Flowsheets (Taken 03/21/2023 1604) LTG: Pt will perform bathing with assistance level/cueing: Independent with assistive device    Problem: RH Dressing Goal: LTG Patient will perform upper body dressing (OT) Description: LTG Patient will perform upper body dressing with assist, with/without cues (OT). Flowsheets (Taken 03/21/2023 1604) LTG: Pt will perform upper body dressing with assistance level of: Independent with assistive device Goal: LTG Patient will perform lower body dressing w/assist (OT) Description: LTG: Patient will perform lower body dressing with assist, with/without cues in positioning using equipment (OT) Flowsheets (Taken 03/21/2023 1604) LTG: Pt will perform lower body  dressing with assistance level of: Independent with assistive device   Problem: RH Toileting Goal: LTG Patient will perform toileting task (3/3 steps) with assistance level (OT) Description: LTG: Patient will perform toileting task (3/3 steps) with assistance level (OT)  Flowsheets (Taken 03/21/2023 1604) LTG: Pt will perform toileting task (3/3 steps) with assistance level: Independent with assistive device   Problem: RH Simple Meal Prep Goal: LTG Patient will perform simple meal prep w/assist (OT) Description: LTG: Patient will perform simple meal prep with assistance, with/without cues (OT). Flowsheets (Taken 03/21/2023 1604) LTG: Pt will perform simple meal prep with assistance level of: Supervision/Verbal cueing   Problem: RH Light Housekeeping Goal: LTG Patient will perform light housekeeping w/assist (OT) Description: LTG: Patient will perform light housekeeping with assistance, with/without cues (OT). Flowsheets (Taken 03/21/2023 1604) LTG: Pt will perform light housekeeping with assistance level of: Supervision/Verbal cueing   Problem: RH Balance Goal: LTG Patient will maintain dynamic standing with ADLs (OT) Description: LTG:  Patient will maintain dynamic standing balance with assist during activities of daily living (OT)  Flowsheets (Taken 03/21/2023 1604) LTG: Pt will maintain dynamic standing balance during ADLs with: Supervision/Verbal cueing   Problem: Sit to Stand Goal: LTG:  Patient will perform sit to stand in prep for activites of daily living with assistance level (OT) Description: LTG:  Patient will perform sit to stand in prep for activites of daily living with assistance level (OT) Flowsheets (Taken 03/21/2023 1604) LTG: PT will perform sit to stand in prep for activites of daily living with assistance level: Supervision/Verbal cueing   Problem: RH Grooming Goal: LTG Patient will perform grooming w/assist,cues/equip (OT) Description: LTG: Patient will perform  grooming with assist, with/without cues using equipment (OT) Flowsheets (Taken 03/21/2023 1604) LTG: Pt will perform grooming with assistance level of: Independent with  assistive device    Problem: RH Bathing Goal: LTG Patient will bathe all body parts with assist levels (OT) Description: LTG: Patient will bathe all body parts with assist levels (OT) Flowsheets (Taken 03/21/2023 1604) LTG: Pt will perform bathing with assistance level/cueing: Independent with assistive device    Problem: RH Dressing Goal: LTG Patient will perform upper body dressing (OT) Description: LTG Patient will perform upper body dressing with assist, with/without cues (OT). Flowsheets (Taken 03/21/2023 1604) LTG: Pt will perform upper body dressing with assistance level of: Independent with assistive device Goal: LTG Patient will perform lower body dressing w/assist (OT) Description: LTG: Patient will perform lower body dressing with assist, with/without cues in positioning using equipment (OT) Flowsheets (Taken 03/21/2023 1604) LTG: Pt will perform lower body dressing with assistance level of: Independent with assistive device   Problem: RH Toileting Goal: LTG Patient will perform toileting task (3/3 steps) with assistance level (OT) Description: LTG: Patient will perform toileting task (3/3 steps) with assistance level (OT)  Flowsheets (Taken 03/21/2023 1604) LTG: Pt will perform toileting task (3/3 steps) with assistance level: Independent with assistive device   Problem: RH Simple Meal Prep Goal: LTG Patient will perform simple meal prep w/assist (OT) Description: LTG: Patient will perform simple meal prep with assistance, with/without cues (OT). Flowsheets (Taken 03/21/2023 1604) LTG: Pt will perform simple meal prep with assistance level of: Supervision/Verbal cueing   Problem: RH Light Housekeeping Goal: LTG Patient will perform light housekeeping w/assist (OT) Description: LTG: Patient will perform light  housekeeping with assistance, with/without cues (OT). Flowsheets (Taken 03/21/2023 1604) LTG: Pt will perform light housekeeping with assistance level of: Supervision/Verbal cueing

## 2023-03-21 NOTE — Evaluation (Signed)
Occupational Therapy Assessment and Plan  Patient Details  Name: Steven [name] MRN: [id number] Date of Birth: 18-Feb-1956  OT Diagnosis: abnormal posture, muscle weakness (generalized), and swelling of limb Rehab Potential: Rehab Potential (ACUTE ONLY): Good ELOS: 7 days   Today's Date: 03/21/2023 OT Individual Time: 0102-7253 OT Individual Time Calculation (min): 74 min     Hospital Problem: Principal Problem:   Debility   Past Medical History:  Past Medical History:  Diagnosis Date   Acute MI, anterior wall (HCC)    AICD (automatic cardioverter/defibrillator) present    CAD (coronary artery disease)    2D ECHO, 07/13/2011 - EF <25%, LV moderatelty dilated, LA moderately dilatedLEXISCAN, 12/14/2011 - moderate-severe perfusion defect seen in the basal anteroseptal, mid anterior, apicacl anterior, apical, apical inferior, and apical lateral regions, post-stress EF 25%, new EKG changes from baseline abnormalities   Cancer (HCC)    Prostate   CHF (congestive heart failure) (HCC) 2012   Hypertension 08/08/2021   Inguinal hernia, left    Pneumonia    November 2023   Pre-diabetes    Past Surgical History:  Past Surgical History:  Procedure Laterality Date   CARDIAC CATHETERIZATION  11/25/2010   Predilation balloon-Apex monorail 2x71mm, Cutting balloon-2.25x75mm, resulting in a reduction of 100% stenosis down to less than 10%   CARDIAC CATHETERIZATION  07/12/2010   LAD stented with a 3.5x13mm bare-metal Veriflex stent resulting in a reduction of 100% lesion to 0%   CARDIAC DEFIBRILLATOR PLACEMENT  03/02/2011   Medtronic Protecta XT DR, model #[id number], serial #[id number]   COLONOSCOPY     GOLD SEED IMPLANT N/A 07/21/2021   Procedure: GOLD SEED IMPLANT;  Surgeon: Heloise Purpura, MD;  Location: [facility];  Service: Urology;  Laterality: N/A;  NEEDS 30 MIN   HERNIA REPAIR  2018   ICD GENERATOR CHANGEOUT N/A 02/24/2019   Procedure: ICD GENERATOR CHANGEOUT;  Surgeon: Thurmon Fair, MD;   Location: [facility];  Service: Cardiovascular;  Laterality: N/A;   INSERTION OF IMPLANTABLE LEFT VENTRICULAR ASSIST DEVICE N/A 03/08/2023   Procedure: INSERTION OF IMPLANTABLE LEFT VENTRICULAR ASSIST DEVICE;  Surgeon: Alleen Borne, MD;  Location: [facility];  Service: Open Heart Surgery;  Laterality: N/A;   PACEMAKER PLACEMENT  07/14/2010   Temporary placement of pacemaker, if rhythm issue continues will need a permanent device   RIGHT HEART CATH N/A 02/28/2023   Procedure: RIGHT HEART CATH;  Surgeon: Iran Ouch, MD;  Location: [facility];  Service: Cardiovascular;  Laterality: N/A;   RIGHT HEART CATH N/A 03/06/2023   Procedure: RIGHT HEART CATH;  Surgeon: Dorthula Nettles, DO;  Location: [facility];  Service: Cardiovascular;  Laterality: N/A;   SPACE OAR INSTILLATION N/A 07/21/2021   Procedure: SPACE OAR INSTILLATION;  Surgeon: Heloise Purpura, MD;  Location: [facility];  Service: Urology;  Laterality: N/A;   TEE WITHOUT CARDIOVERSION N/A 03/08/2023   Procedure: TRANSESOPHAGEAL ECHOCARDIOGRAM;  Surgeon: Alleen Borne, MD;  Location: [facility] OR;  Service: Open Heart Surgery;  Laterality: N/A;    Assessment & Plan Clinical Impression: Patient is a 67 y.o. year old male with hx of ischemic cardiomyopathy/STEMI with ejection fraction of 20 to 25%, prediabetes, systolic congestive heart failure, 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 Recent admission 4/21 to 10/18/2022 for heart failure exacerbation per chart review patient lives alone independent prior  to admission. Two-level home bed bath upstairs. Presented 02/26/2023 with dyspnea. Chest x-ray showed severe diffuse bilateral airspace disease, worse on the right and improving on the left. Admission chemistries unremarkable except BNP  1060, potassium 8.3. Echocardiogram with ejection fraction less than 20%. Left ventricle demonstrating severe decreased function. Cardiology follow-up for acute on chronic combined systolic and diastolic congestive heart failure. Underwent cardiac catheterization showing normal RA pressure, mild pulmonary hypertension mildly elevated wedge pressure and moderately reduced cardiac output. CT of the chest showed extensive calcification of his pericardium particularly over the right atrium and atrioventricular groove part of the right ventricle. His pericardial space likely obliterated due to remote pericarditis. He was started on milrinone as well as diuresis. Evaluated by cardiothoracic surgery for LVAD completed 03/08/2023 per Dr. Maren Beach. He remained intubated through 03/09/2023. Hospital course is chronic Coumadin has been resumed followed by heart failure team. He did go into A-fib with RVR 9/14 IV amiodarone started and slowly weaned transition to p.o currently 200 mg twice daily. Acute blood loss anemia 9.2 and monitored. Urology was consulted 9/17 for hematuria he was placed on Proscar and hematuria has resolved his urinalysis is unremarkable persistent leukocytosis maintained on empiric Rocephin.. Creatinine remained stable 1.74-1.17. Therapies initiated with sternal precautions. Therapy evaluations completed due to patient's debility and deconditioning was admitted for a comprehensive rehab program.   Patient transferred to CIR on 03/20/2023 .   Patient currently requires mod with basic self-care skills and IADL secondary to muscle weakness, decreased cardiorespiratoy endurance, decreased coordination, and decreased standing balance, decreased postural control, decreased balance strategies, and difficulty maintaining precautions.  Prior to hospitalization, patient could complete all aspects of A/IADL's incl work and driving with independent  level.   Patient will benefit from skilled intervention to  decrease level of assist with basic self-care skills, increase independence with basic self-care skills, and increase level of independence with iADL prior to discharge home with care partner.  Anticipate patient will require 24 hour supervision and follow up home health.  OT - End of Session Activity Tolerance: Decreased this session Endurance Deficit: Yes Endurance Deficit Description: requires seated rest breaks OT Assessment Rehab Potential (ACUTE ONLY): Good OT Barriers to Discharge: Inaccessible home environment;Decreased caregiver support;Home environment access/layout;Other (comments) OT Barriers to Discharge Comments: new LVAD, lives alone FF to bed and bath OT Patient demonstrates impairments in the following area(s): Balance;Edema;Endurance;Sensory;Motor;Skin Integrity OT Basic ADL's Functional Problem(s): Grooming;Bathing;Dressing;Toileting OT Advanced ADL's Functional Problem(s): Simple Meal Preparation;Light Housekeeping;Laundry OT Transfers Functional Problem(s): Toilet;Tub/Shower OT Plan OT Intensity: Minimum of 1-2 x/day, 45 to 90 minutes OT Frequency: 5 out of 7 days OT Duration/Estimated Length of Stay: 7 days OT Treatment/Interventions: Balance/vestibular training;Cognitive remediation/compensation;Community reintegration;Discharge planning;Disease mangement/prevention;DME/adaptive equipment instruction;Functional electrical stimulation;Functional mobility training;Neuromuscular re-education;Pain management;Patient/family education;Psychosocial support;Self Care/advanced ADL retraining;Skin care/wound managment;Splinting/orthotics;Therapeutic Activities;Therapeutic Exercise;UE/LE Strength taining/ROM;UE/LE Coordination activities;Visual/perceptual remediation/compensation;Wheelchair propulsion/positioning OT Self Feeding Anticipated Outcome(s): indep OT Basic Self-Care Anticipated Outcome(s): mod I OT Toileting Anticipated Outcome(s): mod I OT Bathroom Transfers Anticipated  Outcome(s): overall Sup/dist s OT Recommendation Recommendations for Other Services: Neuropsych consult Patient destination: Home Follow Up Recommendations: Home health OT Equipment Details: most likely just a reacher min A LB selfcare sinkside, set up UB, CGA amb in room with HHA   OT Evaluation Precautions/Restrictions  Precautions Precautions: Sternal;Other (comment) Restrictions Weight Bearing Restrictions: Yes RUE Weight Bearing: Weight bear through elbow only LUE Weight Bearing: Weight bear through elbow only Other Position/Activity Restrictions: sternal precautions General Chart Reviewed: Yes Family/Caregiver Present: No Vital Signs Therapy Vitals Temp:  98.9 F (37.2 C) Temp Source: Oral Pulse Rate: (!) 59 Resp: 20 BP: 119/66 Patient Position (if appropriate): Lying Oxygen Therapy SpO2: 100 % O2 Device: Room Air Pain Pain Assessment Pain Scale: 0-10 Home Living/Prior Functioning Home Living Family/patient expects to be discharged to:: Private residence Living Arrangements: Alone Available Help at Discharge: Other (Comment) Type of Home: House Home Access: Level entry Home Layout: Two level, Bed/bath upstairs, 1/2 bath on main level Alternate Level Stairs-Number of Steps: flight Alternate Level Stairs-Rails: Right Bathroom Shower/Tub: Engineer, manufacturing systems: Standard Bathroom Accessibility: Yes  Lives With: Alone Prior Function Level of Independence: Independent with gait, Independent with transfers, Independent with homemaking with ambulation, Independent with basic ADLs  Able to Take Stairs?: Yes Driving: Yes 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 Leisure: Hobbies-yes (Comment) (parties, tv) Vision Baseline Vision/History: 1 Wears glasses Ability to See in Adequate Light: 0 Adequate Patient Visual Report: Blurring of vision Vision Assessment?: No apparent visual deficits Perception  Perception: Within Functional  Limits Praxis Praxis: WFL Cognition Cognition Overall Cognitive Status: Within Functional Limits for tasks assessed Arousal/Alertness: Awake/alert Orientation Level: Person;Place;Situation Memory: Appears intact Awareness: Appears intact Problem Solving: Appears intact Safety/Judgment: Appears intact Brief Interview for Mental Status (BIMS) Repetition of Three Words (First Attempt): 3 Temporal Orientation: Year: Correct Temporal Orientation: Month: Accurate within 5 days Temporal Orientation: Day: Correct Recall: "Sock": Yes, no cue required Recall: "Blue": Yes, no cue required Recall: "Bed": Yes, no cue required BIMS Summary Score: 15 Sensation Sensation Light Touch: Impaired Detail Peripheral sensation comments: impaired distally in B LEs Light Touch Impaired Details: Impaired RLE;Impaired LLE Hot/Cold: Appears Intact Proprioception: Appears Intact Stereognosis: Appears Intact Coordination Gross Motor Movements are Fluid and Coordinated: No Coordination and Movement Description: grossly intact, but impacted due to generalized deconditioning Finger Nose Finger Test: [facility] 9 Hole Peg Test: intact Motor  Motor Motor: Other (comment) Motor - Skilled Clinical Observations: grossly impaired due to generalized deconditioning and impaired balance  Trunk/Postural Assessment  Cervical Assessment Cervical Assessment: Within Functional Limits Thoracic Assessment Thoracic Assessment: Within Functional Limits Lumbar Assessment Lumbar Assessment: Exceptions to [facility] Postural Control Postural Control: Within Functional Limits  Balance Balance Balance Assessed: Yes Dynamic Sitting Balance Dynamic Sitting - Balance Support: During functional activity;Feet supported Dynamic Sitting - Level of Assistance: 5: Stand by assistance Sitting balance - Comments: no external support seated EOB Static Standing Balance Static Standing - Balance Support: During functional activity;No upper  extremity supported Dynamic Standing Balance Dynamic Standing - Balance Support: During functional activity;No upper extremity supported Dynamic Standing - Level of Assistance: 4: Min assist;Other (comment) Extremity/Trunk Assessment RUE Assessment RUE Assessment: Within Functional Limits LUE Assessment LUE Assessment: Within Functional Limits  Care Tool Care Tool Self Care Eating   Eating Assist Level: Set up assist    Oral Care    Oral Care Assist Level: Supervision/Verbal cueing    Bathing   Body parts bathed by patient: Right arm;Left arm;Chest;Abdomen;Front perineal area;Right upper leg;Left upper leg;Face Body parts bathed by helper: Right lower leg;Left lower leg   Assist Level: Minimal Assistance - Patient > 75%    Upper Body Dressing(including orthotics)   What is the patient wearing?: Pull over shirt   Assist Level: Contact Guard/Touching assist    Lower Body Dressing (excluding footwear)   What is the patient wearing?: Pants;Incontinence brief Assist for lower body dressing: Moderate Assistance - Patient 50 - 74%    Putting on/Taking off footwear   What is the patient wearing?: Ted hose;Non-skid slipper  socks Assist for footwear: Maximal Assistance - Patient 25 - 49%       Care Tool Toileting Toileting activity   Assist for toileting: Minimal Assistance - Patient > 75%     Care Tool Bed Mobility Roll left and right activity   Roll left and right assist level: Minimal Assistance - Patient > 75%    Sit to lying activity   Sit to lying assist level: Minimal Assistance - Patient > 75%    Lying to sitting on side of bed activity   Lying to sitting on side of bed assist level: the ability to move from lying on the back to sitting on the side of the bed with no back support.: Minimal Assistance - Patient > 75%     Care Tool Transfers Sit to stand transfer   Sit to stand assist level: Minimal Assistance - Patient > 75%    Chair/bed transfer   Chair/bed  transfer assist level: Minimal Assistance - Patient > 75%     Toilet transfer   Assist Level: Minimal Assistance - Patient > 75%     Care Tool Cognition  Expression of Ideas and Wants Expression of Ideas and Wants: 4. Without difficulty (complex and basic) - expresses complex messages without difficulty and with speech that is clear and easy to understand  Understanding Verbal and Non-Verbal Content Understanding Verbal and Non-Verbal Content: 4. Understands (complex and basic) - clear comprehension without cues or repetitions   Memory/Recall Ability Memory/Recall Ability : Current season;Location of own room;That he or she is in a hospital/hospital unit   Refer to Care Plan for Long Term Goals  SHORT TERM GOAL WEEK 1 OT Short Term Goal 1 (Week 1): STG's=LTGs d/t LOS  Recommendations for other services: Neuropsych   Skilled Therapeutic Intervention ADL ADL Eating: Modified independent Where Assessed-Eating: Chair Grooming: Setup Where Assessed-Grooming: Sitting at sink Upper Body Bathing: Contact guard Where Assessed-Upper Body Bathing: Sitting at sink Lower Body Bathing: Moderate assistance Where Assessed-Lower Body Bathing: Standing at sink;Sitting at sink Upper Body Dressing: Contact guard Where Assessed-Upper Body Dressing: Sitting at sink Lower Body Dressing: Minimal assistance Where Assessed-Lower Body Dressing: Standing at sink;Sitting at sink Toileting: Minimal assistance Where Assessed-Toileting: Toilet;Hydrologist: Curator Method: Proofreader: Engineer, technical sales: Not assessed Film/video editor: Not assessed ADL Comments: min A LB selfcare, close S UB self care sink side, CGA without AD, support for LVAD lines Mobility  Bed Mobility Bed Mobility: Supine to Sit;Sit to Supine Supine to Sit: Minimal Assistance - Patient > 75% Sit to Supine: Minimal Assistance - Patient >  75% Transfers Sit to Stand: Contact Guard/Touching assist;Minimal Assistance - Patient > 75% Stand to Sit: Contact Guard/Touching assist;Minimal Assistance - Patient > 75%  OT Treatment/Intervention:   Pt seen for full initial OT evaluation and training session this am. Pt in bed upon OT arrival. OT introduced role of therapy and purpose of session. Pt open to all presented assessment and training this visit.  OT assisted and assessed ADL's, mobility, vision, sensation. cognition/lang, G/FMC, strength and balance throughout session. See above for levels.  Pt will benefit from skilled OT services at CIR to maximize function and safety with recommendation to return home with mod I for BADL's and S for higher level activity with home OT services upon d/c home with family support. Pt left at end of session in recliner with LE's elevated and chair alarm set, tray table and nurse call bell within  reach.   Discharge Criteria: Patient will be discharged from OT if patient refuses treatment 3 consecutive times without medical reason, if treatment goals not met, if there is a change in medical status, if patient makes no progress towards goals or if patient is discharged from hospital.  The above assessment, treatment plan, treatment alternatives and goals were discussed and mutually agreed upon: by patient  Vicenta Dunning 03/21/2023, 4:42 PM

## 2023-03-21 NOTE — Progress Notes (Addendum)
Advanced Heart Failure VAD Team Note  PCP-Cardiologist: Nicki Guadalajara, MD   Subjective:    9/12: s/p HM3 LVAD 9/24: discharged to CIR   Finished morning PT session. Felt he did ok. Had mild dyspnea w/ abmbuation and steps. BP has been soft but denies dizziness.   WBC trending down, 17>>15K. Hgb remains stable at 9.2. INR 2.5   Not eating much. Dosen't like the food.   CVP 5. Co-ox pending.    LVAD INTERROGATION:  HeartMate III LVAD:   Flow 4.5 liters/min, speed 5500, power 4.1, PI 3.4.    Objective:    Vital Signs:   Temp:  [97.6 F (36.4 C)-97.9 F (36.6 C)] 97.7 F (36.5 C) (09/24 2000) Pulse Rate:  [76-82] 76 (09/25 0800) Resp:  [20-21] 20 (09/24 1622) BP: (72-100)/(58-68) 72/58 (09/25 0800) SpO2:  [97 %-100 %] 100 % (09/24 1717) Weight:  [71.5 kg] 71.5 kg (09/24 1608) Last BM Date : 03/19/23 Mean arterial Pressure MAP 63   Intake/Output:   Intake/Output Summary (Last 24 hours) at 03/21/2023 1007 Last data filed at 03/21/2023 0907 Gross per 24 hour  Intake 240 ml  Output 600 ml  Net -360 ml     Physical Exam    CVP 5  General:  thin fatigued appearing. No resp difficulty HEENT: normal Neck: supple. JVP not elevated . Carotids 2+ bilat; no bruits. No lymphadenopathy or thyromegaly appreciated. Cor: Mechanical heart sounds with LVAD hum present. Lungs: clear Abdomen: soft, nontender, nondistended. No hepatosplenomegaly. No bruits or masses. Good bowel sounds. Driveline: C/D/I; securement device intact and driveline incorporated Extremities: no cyanosis, clubbing, rash, edema + RUE PICC  Neuro: alert & orientedx3, cranial nerves grossly intact. moves all 4 extremities w/o difficulty. Affect pleasant   Telemetry   N/A   EKG    No new 12 lead to review   Labs   Basic Metabolic Panel: Recent Labs  Lab 03/15/23 0105 03/15/23 1000 03/17/23 0510 03/18/23 0435 03/19/23 0530 03/20/23 0530 03/21/23 0448  NA  --    < > 132* 130* 131* 128* 131*   K  --    < > 4.0 3.7 4.0 4.0 4.2  CL  --    < > 98 93* 98 98 99  CO2  --    < > 25 25 25  21* 21*  GLUCOSE  --    < > 103* 100* 99 109* 80  BUN  --    < > 25* 22 19 20 19   CREATININE  --    < > 1.44* 1.33* 1.17 1.12 1.11  CALCIUM  --    < > 8.6* 8.6* 8.2* 7.9* 8.5*  MG 2.3  --   --   --  2.1 2.0  --   PHOS 3.2  --   --   --   --   --   --    < > = values in this interval not displayed.    Liver Function Tests: Recent Labs  Lab 03/17/23 0510 03/21/23 0448  AST 139* 112*  ALT 103* 131*  ALKPHOS 70 82  BILITOT 1.4* 1.1  PROT 6.3* 6.2*  ALBUMIN 2.7* 2.5*   No results for input(s): "LIPASE", "AMYLASE" in the last 168 hours. No results for input(s): "AMMONIA" in the last 168 hours.  CBC: Recent Labs  Lab 03/15/23 0105 03/16/23 0602 03/17/23 0510 03/18/23 0435 03/19/23 0530 03/20/23 0548 03/21/23 0448  WBC 12.9*   < > 16.8* 17.4* 17.5* 18.0* 15.4*  NEUTROABS 9.7*  --   --   --   --   --  13.0*  HGB 8.4*   < > 8.8* 9.3* 9.2* 9.2* 9.2*  HCT 24.6*   < > 27.7* 28.8* 28.9* 28.5* 29.0*  MCV 93.2   < > 94.5 95.7 94.1 94.1 93.9  PLT 240   < > 346 426* 439* 464* 528*   < > = values in this interval not displayed.    INR: Recent Labs  Lab 03/17/23 0510 03/18/23 0435 03/19/23 0530 03/20/23 0530 03/21/23 0448  INR 2.1* 2.2* 2.3* 2.6* 2.5*    Other results: EKG:    Imaging   DG CHEST PORT 1 VIEW  Result Date: 03/20/2023 CLINICAL DATA:  Cough, leukocytosis EXAM: PORTABLE CHEST 1 VIEW COMPARISON:  Chest radiograph 03/16/2023 FINDINGS: The right upper extremity PICC terminating in the mid SVC, left chest wall cardiac device and associated leads, median sternotomy wires, and LVAD are unchanged. The heart is enlarged with dense pericardial calcifications, unchanged. The upper mediastinal contours are normal. The right pleural effusion has essentially resolved, with improved aeration of the lung base. There is a probable small residual left pleural effusion with mild adjacent  airspace opacities, unchanged. There is no new or worsening focal airspace opacity. There is no overt pulmonary edema. There is no pneumothorax There is no acute osseous abnormality. IMPRESSION: 1. Probable small residual left pleural effusion and mild adjacent airspace opacity likely reflecting atelectasis. 2. Essentially resolved right pleural effusion. Electronically Signed   By: Lesia Hausen M.D.   On: 03/20/2023 11:47     Medications:     Scheduled Medications:  amiodarone  200 mg Oral BID   aspirin EC  81 mg Oral Daily   atorvastatin  80 mg Oral Daily   bisacodyl  10 mg Oral Daily   Or   bisacodyl  10 mg Rectal Daily   Chlorhexidine Gluconate Cloth  6 each Topical BID   docusate sodium  200 mg Oral Daily   feeding supplement  237 mL Oral Q24H   finasteride  5 mg Oral Daily   insulin aspart  0-24 Units Subcutaneous TID WC   losartan  25 mg Oral Daily   multivitamin with minerals  1 tablet Oral Daily   pantoprazole  40 mg Oral Daily   polyethylene glycol  17 g Oral Daily   sodium chloride flush  10-40 mL Intracatheter Q12H   spironolactone  12.5 mg Oral Daily   tamsulosin  0.4 mg Oral QPC supper   warfarin  1 mg Oral ONCE-1600   Warfarin - Pharmacist Dosing Inpatient   Does not apply q1600    Infusions:  sodium chloride 10 mL/hr at 03/20/23 1803    PRN Medications: sodium chloride, guaiFENesin, oxyCODONE, traMADol, traZODone   Patient Profile   67 y/o male w/ chronic systolic heart failure due to ICM 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 Now s/p HM3 LVAD 9/12. Discharged to CIR on 9/24    Assessment/Plan:    1. Acute on chronic combined systolic and diastolic heart failure>>Low Output S/p HM3 LVAD on 9/12.  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 NYHA IV on admission.  Echo 02/27/23: EF <20%, LV with GHK, RV mildly reduced, GIIDD, LA mod dilated, mild MR.  End-stage HF w/ low output and inotrope  dependent. GDMT limited by renal function and hypotension.  S/P HM3 LVAD implant 9/12.  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 Intra-op TEE LVEF 15%, RV normal.  - Ramp ECHO 03/13/23. Speed increased to 5500.  Pain controlled with tylenol.  - MAPs have been low 60s.  - will stop Losartan for now  - stop spiro - Off Bidil w/ soft BPs - volume status ok, torsemide PRN (does not need today)  - check LDH  - INR 2.5 Discussed dosing with PharmD personally. - Continue 81 mg ASA  2. 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  -  no s/s angina - on ASA/statin   3. AKI on CKD stage 3: Creatinine baseline 1.6-1.8 - cardiorenal, resolved w/ LVAD support, SCr 1.1 today  - avoid hypotension. GDMT per above   4. Mural thrombus: on Warfarin.  - INR 2.5   5. 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 Anemia: Post-op. S/p 1UPRBCs.  - Hgb stable 9.2   7. Atrial fibrillation:  - continue amio 200 mg twice a day   - On coumadin. INR 2.5  8. Hematuria:  - Urology consulted. Started on proscar.  - Suspect related to trauma with foley insertion.  - resolved, Hgb stable. Continue to follow.   9. UTI  - scheduled for last dose of ceftriaxone today  - WBCs down trending.   10. Deconditioning:  - CIR. Appreciate their assistance    I reviewed the LVAD parameters from today, and compared the results to the patient's prior recorded data.  No programming changes were made.  The LVAD is functioning within specified parameters.  The patient performs LVAD self-test daily.  LVAD interrogation was negative for any significant power changes, alarms or PI events/speed drops.  LVAD equipment check completed and is in good working order.  Back-up equipment present.   LVAD education done on emergency procedures and precautions and reviewed exit site care.  Length of Stay: 1  Knute Neu 03/21/2023, 10:07 AM  VAD Team --- VAD ISSUES  ONLY--- Pager [phone] (7am - 7am)  Advanced Heart Failure Team  Pager [phone] (M-F; 7a - 5p)  Please contact [facility] for night-coverage after hours (5p -7a ) and weekends on [email or web address]  Patient seen and examined with the above-signed Advanced Practice Provider and/or Housestaff. I personally reviewed laboratory data, imaging studies and relevant notes. I independently examined the patient and formulated the important aspects of the plan. I have edited the note to reflect any of my changes or salient points. I have personally discussed the plan with the patient and/or family.  Working with rehab. Feels tired after 2 sessions today  MAPs soft. Denies CP or SOB   General:  Sitting in chair  NAD.  HEENT: normal  Neck: supple. JVP not elevated.  Carotids 2+ bilat; no bruits. No lymphadenopathy or thryomegaly appreciated. Cor: LVAD hum.  Lungs: Clear. Abdomen: soft, nontender, non-distended. No hepatosplenomegaly. No bruits or masses. Good bowel sounds. Driveline site clean. Anchor in place.  Extremities: no cyanosis, clubbing, rash. Warm no edema  Neuro: alert & oriented x 3. No focal deficits. Moves all 4 without problem   MAPs soft. Innumerable PIs on VAD. Suspect he is dry. Stop spiro and losartan. Will give 500cc LR. Encouraged po intake.   INR 2.5. Discussed dosing with PharmD personally.  Arvilla Meres, MD  3:57 PM

## 2023-03-21 NOTE — Progress Notes (Signed)
Had a quite night. Remains alert and oriented. Denies pain, dizziness, or respiratory discomfort. Had dinner in the chair and ate enough of his meal. Encouraged to hydrate orally. LVAD device functional and acquired data recorded in EHR. Safety maintained.

## 2023-03-21 NOTE — Evaluation (Signed)
Physical Therapy Assessment and Plan  Patient Details  Name: Steven [name] MRN: [id number] Date of Birth: Aug 25, 1955  PT Diagnosis: Abnormality of gait, Edema, Impaired sensation, and Muscle weakness Rehab Potential: Good ELOS: ~7 days   Today's Date: 03/21/2023 PT Individual Time: 0803-0920 PT Individual Time Calculation (min): 77 min    Hospital Problem: Principal Problem:   Debility   Past Medical History:  Past Medical History:  Diagnosis Date   Acute MI, anterior wall (HCC)    AICD (automatic cardioverter/defibrillator) present    CAD (coronary artery disease)    2D ECHO, 07/13/2011 - EF <25%, LV moderatelty dilated, LA moderately dilatedLEXISCAN, 12/14/2011 - moderate-severe perfusion defect seen in the basal anteroseptal, mid anterior, apicacl anterior, apical, apical inferior, and apical lateral regions, post-stress EF 25%, new EKG changes from baseline abnormalities   Cancer (HCC)    Prostate   CHF (congestive heart failure) (HCC) 2012   Hypertension 08/08/2021   Inguinal hernia, left    Pneumonia    November 2023   Pre-diabetes    Past Surgical History:  Past Surgical History:  Procedure Laterality Date   CARDIAC CATHETERIZATION  11/25/2010   Predilation balloon-Apex monorail 2x49mm, Cutting balloon-2.25x58mm, resulting in a reduction of 100% stenosis down to less than 10%   CARDIAC CATHETERIZATION  07/12/2010   LAD stented with a 3.5x70mm bare-metal Veriflex stent resulting in a reduction of 100% lesion to 0%   CARDIAC DEFIBRILLATOR PLACEMENT  03/02/2011   Medtronic Protecta XT DR, model #[id number], serial #[id number] H   COLONOSCOPY     GOLD SEED IMPLANT N/A 07/21/2021   Procedure: GOLD SEED IMPLANT;  Surgeon: Heloise Purpura, MD;  Location: [facility];  Service: Urology;  Laterality: N/A;  NEEDS 30 MIN   HERNIA REPAIR  2018   ICD GENERATOR CHANGEOUT N/A 02/24/2019   Procedure: ICD GENERATOR CHANGEOUT;  Surgeon: Thurmon Fair, MD;  Location: [facility];   Service: Cardiovascular;  Laterality: N/A;   INSERTION OF IMPLANTABLE LEFT VENTRICULAR ASSIST DEVICE N/A 03/08/2023   Procedure: INSERTION OF IMPLANTABLE LEFT VENTRICULAR ASSIST DEVICE;  Surgeon: Alleen Borne, MD;  Location: [facility] OR;  Service: Open Heart Surgery;  Laterality: N/A;   PACEMAKER PLACEMENT  07/14/2010   Temporary placement of pacemaker, if rhythm issue continues will need a permanent device   RIGHT HEART CATH N/A 02/28/2023   Procedure: RIGHT HEART CATH;  Surgeon: Iran Ouch, MD;  Location: [facility];  Service: Cardiovascular;  Laterality: N/A;   RIGHT HEART CATH N/A 03/06/2023   Procedure: RIGHT HEART CATH;  Surgeon: Dorthula Nettles, DO;  Location: [facility];  Service: Cardiovascular;  Laterality: N/A;   SPACE OAR INSTILLATION N/A 07/21/2021   Procedure: SPACE OAR INSTILLATION;  Surgeon: Heloise Purpura, MD;  Location: [facility];  Service: Urology;  Laterality: N/A;   TEE WITHOUT CARDIOVERSION N/A 03/08/2023   Procedure: TRANSESOPHAGEAL ECHOCARDIOGRAM;  Surgeon: Alleen Borne, MD;  Location: [facility] OR;  Service: Open Heart Surgery;  Laterality: N/A;    Assessment & Plan Clinical Impression: Patient is a 67 y.o. year old male with hx of ischemic cardiomyopathy/STEMI with ejection fraction of 20 to 25%, prediabetes, systolic congestive heart failure, 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 Recent admission 4/21 to 10/18/2022 for heart failure exacerbation per chart review patient lives alone independent prior to admission. Two-level home bed  bath upstairs. Presented 02/26/2023 with dyspnea. Chest x-ray showed severe diffuse bilateral airspace disease, worse on the right and improving on the left. Admission chemistries unremarkable except BNP 1060, potassium 8.3.  Echocardiogram with ejection fraction less than 20%. Left ventricle demonstrating severe decreased function. Cardiology follow-up for acute on chronic combined systolic and diastolic congestive heart failure. Underwent cardiac catheterization showing normal RA pressure, mild pulmonary hypertension mildly elevated wedge pressure and moderately reduced cardiac output. CT of the chest showed extensive calcification of his pericardium particularly over the right atrium and atrioventricular groove part of the right ventricle. His pericardial space likely obliterated due to remote pericarditis. He was started on milrinone as well as diuresis. Evaluated by cardiothoracic surgery for LVAD completed 03/08/2023 per Dr. Maren Beach. He remained intubated through 03/09/2023. Hospital course is chronic Coumadin has been resumed followed by heart failure team. He did go into A-fib with RVR 9/14 IV amiodarone started and slowly weaned transition to p.o currently 200 mg twice daily. Acute blood loss anemia 9.2 and monitored. Urology was consulted 9/17 for hematuria he was placed on Proscar and hematuria has resolved his urinalysis is unremarkable persistent leukocytosis maintained on empiric Rocephin.. Creatinine remained stable 1.74-1.17. Therapies initiated with sternal precautions. Therapy evaluations completed due to patient's debility and deconditioning was admitted for a comprehensive rehab program.   Patient transferred to CIR on 03/20/2023 .   Patient currently requires min with mobility secondary to muscle weakness, decreased cardiorespiratoy endurance,  , decreased awareness, decreased problem solving, and delayed processing, and decreased standing balance, decreased postural control, and decreased balance strategies.  Prior to hospitalization, patient was independent  with mobility and lived with Alone in a   home.  Home access is  Level entry.  Patient will benefit from skilled PT intervention to maximize safe functional  mobility, minimize fall risk, and decrease caregiver burden for planned discharge home with 24 hour supervision.  Anticipate patient will benefit from follow up HH at discharge.  PT - End of Session Activity Tolerance: Tolerates 30+ min activity with multiple rests Endurance Deficit: Yes Endurance Deficit Description: requires seated rest breaks PT Assessment Rehab Potential (ACUTE/IP ONLY): Good PT Barriers to Discharge: Decreased caregiver support;Other (comments) PT Barriers to Discharge Comments: needs further education on LVAD management PT Patient demonstrates impairments in the following area(s): Balance;Sensory;Endurance;Motor;Edema PT Transfers Functional Problem(s): Bed Mobility;Car;Bed to Chair;Furniture PT Locomotion Functional Problem(s): Ambulation;Stairs PT Plan PT Intensity: Minimum of 1-2 x/day ,45 to 90 minutes PT Frequency: 5 out of 7 days PT Duration Estimated Length of Stay: ~7 days PT Treatment/Interventions: Ambulation/gait training;Cognitive remediation/compensation;Discharge planning;DME/adaptive equipment instruction;Functional mobility training;Pain management;Psychosocial support;Therapeutic Activities;UE/LE Strength taining/ROM;Balance/vestibular training;Community reintegration;Disease management/prevention;Neuromuscular re-education;Patient/family education;Skin care/wound management;Stair training;Therapeutic Exercise;UE/LE Coordination activities PT Transfers Anticipated Outcome(s): supervision PT Locomotion Anticipated Outcome(s): supervision PT Recommendation Recommendations for Other Services: Neuropsych consult;Speech consult Follow Up Recommendations: Home health PT;24 hour supervision/assistance Patient destination: Home Equipment Recommended: To be determined   PT Evaluation Precautions/Restrictions Precautions Precautions: Sternal;Other (comment) Restrictions Weight Bearing Restrictions: No RUE Weight Bearing: Weight bear through elbow  only LUE Weight Bearing: Weight bear through elbow only Other Position/Activity Restrictions: sternal precautions Pain Pain Assessment Pain Scale: 0-10 Pain Score: 0-No pain (states only pain is when he coughs) Pain Interference Pain Interference Pain Effect on Sleep: 1. Rarely or not at all Pain Interference with Therapy Activities: 1. Rarely or not at all Pain Interference with Day-to-Day Activities: 1. Rarely or not at all Home Living/Prior Functioning Home Living Available Help at Discharge: Other (Comment) (pt's daughter's  mom, Suzette Battiest, will be patient's support for the first 2 weeks and pt believes she will move in with him for that time) Home Access: Level entry Home Layout: Two level;Bed/bath upstairs;1/2 bath on main level Alternate Level Stairs-Number of Steps: flight Alternate Level Stairs-Rails: Right  Lives With: Alone Prior Function Level of Independence: Independent with gait;Independent with transfers;Independent with homemaking with ambulation  Able to Take Stairs?: Yes Driving: Yes 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 Vision/Perception  Perception Perception: Within Functional Limits Praxis Praxis: WFL  Cognition Overall Cognitive Status: Within Functional Limits for tasks assessed (some potential awareness/problem solving deficits but wil need further assessment as pt may just be getting used to hospital environment) Arousal/Alertness: Awake/alert Orientation Level: Oriented X4 Year: 2024 Month: September Day of Week: Correct Awareness: Impaired Problem Solving: Impaired Safety/Judgment: Appears intact Sensation Sensation Light Touch: Impaired Detail Peripheral sensation comments: impaired distally in B LEs Light Touch Impaired Details: Impaired RLE;Impaired LLE Hot/Cold: Not tested Proprioception: Appears Intact Stereognosis: Not tested Coordination Gross Motor Movements are Fluid and Coordinated: No Coordination and  Movement Description: grossly intact, but impacted due to generalized deconditioning Motor  Motor Motor: Other (comment) Motor - Skilled Clinical Observations: grossly impaired due to generalized deconditioning and impaired balance   Trunk/Postural Assessment  Cervical Assessment Cervical Assessment: Within Functional Limits Thoracic Assessment Thoracic Assessment: Within Functional Limits Lumbar Assessment Lumbar Assessment: Exceptions to [facility] (min posterior pelvic tilt) Postural Control Postural Control: Within Functional Limits  Balance Balance Balance Assessed: Yes Static Sitting Balance Static Sitting - Balance Support: Feet supported Static Sitting - Level of Assistance: 6: Modified independent (Device/Increase time) Dynamic Sitting Balance Dynamic Sitting - Balance Support: During functional activity;Feet supported Dynamic Sitting - Level of Assistance: 5: Stand by assistance Static Standing Balance Static Standing - Balance Support: During functional activity;No upper extremity supported Static Standing - Level of Assistance: Other (comment) (CGA) Dynamic Standing Balance Dynamic Standing - Balance Support: During functional activity;No upper extremity supported Dynamic Standing - Level of Assistance: 4: Min assist;Other (comment) (CGA) Extremity Assessment      RLE Assessment RLE Assessment: Within Functional Limits Active Range of Motion (AROM) Comments: WFL General Strength Comments: assessed in sitting RLE Strength Right Hip Flexion: 4+/5 Right Knee Flexion: 5/5 Right Knee Extension: 5/5 Right Ankle Dorsiflexion: 4+/5 Right Ankle Plantar Flexion: 4+/5 LLE Assessment LLE Assessment: Exceptions to [facility] Active Range of Motion (AROM) Comments: WFL General Strength Comments: assessed in sitting LLE Strength Left Hip Flexion: 4/5 Left Knee Flexion: 5/5 Left Knee Extension: 5/5 Left Ankle Dorsiflexion: 4+/5 Left Ankle Plantar Flexion: 4+/5  Care Tool Care  Tool Bed Mobility Roll left and right activity   Roll left and right assist level: Minimal Assistance - Patient > 75%    Sit to lying activity   Sit to lying assist level: Minimal Assistance - Patient > 75%    Lying to sitting on side of bed activity   Lying to sitting on side of bed assist level: the ability to move from lying on the back to sitting on the side of the bed with no back support.: Minimal Assistance - Patient > 75%     Care Tool Transfers Sit to stand transfer   Sit to stand assist level: Minimal Assistance - Patient > 75%    Chair/bed transfer   Chair/bed transfer assist level: Minimal Assistance - Patient > 75%     Toilet transfer   Assist Level: Minimal Assistance - Patient > 75%    Licensed conveyancer  transfer activity did not occur: Safety/medical concerns        Care Tool Locomotion Ambulation   Assist level: Minimal Assistance - Patient > 75% Assistive device: No Device Max distance: 1110ft  Walk 10 feet activity   Assist level: Minimal Assistance - Patient > 75% Assistive device: No Device   Walk 50 feet with 2 turns activity   Assist level: Minimal Assistance - Patient > 75% Assistive device: No Device  Walk 150 feet activity   Assist level: Minimal Assistance - Patient > 75% Assistive device: No Device  Walk 10 feet on uneven surfaces activity Walk 10 feet on uneven surfaces activity did not occur: Safety/medical concerns      Stairs   Assist level: Minimal Assistance - Patient > 75% Stairs assistive device: 1 hand rail Max number of stairs: 12  Walk up/down 1 step activity   Walk up/down 1 step (curb) assist level: Minimal Assistance - Patient > 75% Walk up/down 1 step or curb assistive device: 1 hand rail  Walk up/down 4 steps activity   Walk up/down 4 steps assist level: Minimal Assistance - Patient > 75% Walk up/down 4 steps assistive device: 1 hand rail  Walk up/down 12 steps activity   Walk up/down 12 steps assist level: Minimal  Assistance - Patient > 75% Walk up/down 12 steps assistive device: 1 hand rail  Pick up small objects from floor   Pick up small object from the floor assist level: Moderate Assistance - Patient 50 - 74%    Wheelchair Is the patient using a wheelchair?: No          Wheel 50 feet with 2 turns activity      Wheel 150 feet activity        Refer to Care Plan for Long Term Goals  SHORT TERM GOAL WEEK 1 PT Short Term Goal 1 (Week 1): = to LTGs based on ELOS  Recommendations for other services: Neuropsych and Other: Potential need for SLP referral, notified primary PT and OT to continue assessing in future sessions  Skilled Therapeutic Intervention Pt received sitting in recliner with nurse present for medication administration and pt agreeable to therapy session. Evaluation completed (see details above) with patient education regarding purpose of PT evaluation, PT POC and goals, therapy schedule, weekly team meetings, and other CIR information including safety plan and fall risk safety. Pt performed the below functional mobility tasks with the specified levels of skilled cuing and assistance. Therapist allowing pt to direct his care needs for moving LVAD from wall to battery power - pt able to complete with min A and cuing to untangle the lines - requires increased time and has some challenge lining up the plugs. With question cuing pt able to recall 25% of what he needed in his back-up bag for the LVAD. Pt will benefit from additional training/practice with managing his LVAD. Pt noted to be soiled in seat and pt appears unaware (believe pt thought he was still connected to purewick - discussed with nurse and removed this and provided pt with urinal). Pt benefits from min cuing during transfers to maintain sternal precautions. At end of session, pt left seated in recliner with needs in reach and seat belt alarm on.  Mobility Bed Mobility Bed Mobility: Supine to Sit;Sit to Supine Supine to Sit:  Minimal Assistance - Patient > 75% Sit to Supine: Minimal Assistance - Patient > 75% Transfers Transfers: Sit to Stand;Stand to Sit;Stand Pivot Transfers Sit to Stand: Contact Guard/Touching assist;Minimal  Assistance - Patient > 75% Stand to Sit: Contact Guard/Touching assist;Minimal Assistance - Patient > 75% Stand Pivot Transfers: Contact Guard/Touching assist;Minimal Assistance - Patient > 75% Stand Pivot Transfer Details: Verbal cues for sequencing;Verbal cues for technique;Tactile cues for weight shifting;Tactile cues for sequencing;Verbal cues for gait pattern Transfer (Assistive device): None Locomotion  Gait Ambulation: Yes Gait Assistance: Contact Guard/Touching assist;Minimal Assistance - Patient > 75% Gait Distance (Feet): 170 Feet Assistive device: None Gait Assistance Details: Tactile cues for sequencing;Tactile cues for weight shifting;Verbal cues for precautions/safety;Verbal cues for gait pattern Gait Gait: Yes Gait Pattern: Impaired Gait Pattern: Step-through pattern;Decreased step length - right;Decreased step length - left Gait velocity: decreased Stairs / Additional Locomotion Stairs: Yes Stairs Assistance: Contact Guard/Touching assist;Minimal Assistance - Patient > 75% Stair Management Technique: One rail Right;Alternating pattern;Forwards Number of Stairs: 12 Height of Stairs: 6 Wheelchair Mobility Wheelchair Mobility: No   Discharge Criteria: Patient will be discharged from PT if patient refuses treatment 3 consecutive times without medical reason, if treatment goals not met, if there is a change in medical status, if patient makes no progress towards goals or if patient is discharged from hospital.  The above assessment, treatment plan, treatment alternatives and goals were discussed and mutually agreed upon: by patient  Ginny Forth , PT, DPT, NCS, CSRS 03/21/2023, 9:12 AM

## 2023-03-21 NOTE — Progress Notes (Signed)
Inpatient Rehabilitation Center Individual Statement of Services  Patient Name:  Steven [name]  Date:  03/21/2023  Welcome to the Inpatient Rehabilitation Center.  Our goal is to provide you with an individualized program based on your diagnosis and situation, designed to meet your specific needs.  With this comprehensive rehabilitation program, you will be expected to participate in at least 3 hours of rehabilitation therapies Monday-Friday, with modified therapy programming on the weekends.  Your rehabilitation program will include the following services:  Physical Therapy (PT), Occupational Therapy (OT), 24 hour per day rehabilitation nursing, Therapeutic Recreaction (TR), Neuropsychology, Care Coordinator, Rehabilitation Medicine, Nutrition Services, and Pharmacy Services  Weekly team conferences will be held on Wednesday to discuss your progress.  Your Inpatient Rehabilitation Care Coordinator will talk with you frequently to get your input and to update you on team discussions.  Team conferences with you and your family in attendance may also be held.  Expected length of stay: 7 days  Overall anticipated outcome: mod/I-supervision-stairs  Depending on your progress and recovery, your program may change. Your Inpatient Rehabilitation Care Coordinator will coordinate services and will keep you informed of any changes. Your Inpatient Rehabilitation Care Coordinator's name and contact numbers are listed  below.  The following services may also be recommended but are not provided by the Inpatient Rehabilitation Center:  Driving Evaluations Home Health Rehabiltiation Services Outpatient Rehabilitation Services    Arrangements will be made to provide these services after discharge if needed.  Arrangements include referral to agencies that provide these services.  Your insurance has been verified to be:  [employer]_Medicare and medicaid Your primary doctor is:  Cheri Kearns  Pertinent information  will be shared with your doctor and your insurance company.  Inpatient Rehabilitation Care Coordinator:  Dossie Der, [place] [phone] or Luna Glasgow  Information discussed with and copy given to patient by: Lucy Chris, 03/21/2023, 9:53 AM

## 2023-03-21 NOTE — Progress Notes (Signed)
Patient ID: Steven [name], male   DOB: 06/21/1956, 67 y.o.   MRN: [id number]  Met with pt to give him the team conference update regarding his goals of mod/I level but supervision for stairs. Our target discharge date is 10/1. Pt feels like is doing ok but is still weak and needs to get stronger. Will continue to work on discharge needs.

## 2023-03-21 NOTE — Discharge Instructions (Addendum)
Inpatient Rehab Discharge Instructions  Steven [name] Discharge date and time: No discharge date for patient encounter.   Activities/Precautions/ Functional Status: Activity: Sternal precautions Diet: regular diet Wound Care: Routine skin checks Functional status:  ___ No restrictions     ___ Walk up steps independently ___ 24/7 supervision/assistance   ___ Walk up steps with assistance ___ Intermittent supervision/assistance  ___ Bathe/dress independently ___ Walk with walker     _x__ Bathe/dress with assistance ___ Walk Independently    ___ Shower independently ___ Walk with assistance    ___ Shower with assistance ___ No alcohol     ___ Return to work/school ________  Special Instructions: No driving smoking or alcohol  Home health nurse to check INR 03/30/2023 results to [facility] Coumadin clinic [phone] fax [phone]   COMMUNITY REFERRALS UPON DISCHARGE:    Home Health:   PT,   OT   RN                Agency:[facility] Phone:[phone]    Medical Equipment/Items Ordered: 3 in 1 and tub bench                                                 Agency/Supplier:[facility]  [phone]   My questions have been answered and I understand these instructions. I will adhere to these goals and the provided educational materials after my discharge from the hospital.  Patient/Caregiver Signature _______________________________ Date __________  Clinician Signature _______________________________________ Date __________  Please bring this form and your medication list with you to all your follow-up doctor's appointments.

## 2023-03-21 NOTE — Progress Notes (Signed)
Physical Therapy Session Note  Patient Details  Name: Steven [name] MRN: [id number] Date of Birth: March 07, 1956  Today's Date: 03/21/2023 PT Individual Time: 6578-4696 PT Individual Time Calculation (min): 42 min   Short Term Goals: Week 1:  PT Short Term Goal 1 (Week 1): = to LTGs based on ELOS  Skilled Therapeutic Interventions/Progress Updates:      Pt seated in recliner upon arrival. Pt agreeable to therapy. Pt denies any pain.   Therapist allowing pt to direct his care needs for moving LVAD from wall to battery power and battery power to wall at end of session - pt able to complete with min A and cuing to untangle the lines - requires increased time and instructional cues. With max question cuing pt able to recall what he needed in his back-up bag for the LVAD, and for returning all batteries to charger at end of session  Pt will benefit from additional training/practice with managing his LVAD.   Pt reports need to use bathroom. Pt performed sit to stand with no AD and ambulation to [facility] with CGA/min A. Pt continent of bowel and bladder. Pt donned and doffed pants while standing with CGA, and performed pericare with mod A, verbal cues provided for maintenance of sternal precautions throughout.   Pt ambulated from room to day room, day room to room with no AD with CGA. Pt performed Sit to stand x10 from mat table with arms across chest and CGA, verbal cue provided for technique.   Pt seated in recliner at end of session with all needs within reach and seatbelt alarm on.    Therapy Documentation Precautions:  Precautions Precautions: Sternal, Other (comment) Restrictions Weight Bearing Restrictions: Yes RUE Weight Bearing: Weight bear through elbow only LUE Weight Bearing: Weight bear through elbow only Other Position/Activity Restrictions: sternal precautions  Therapy/Group: Individual Therapy  [facility] Ambrose [place], [place], DPT  03/21/2023, 4:45 PM

## 2023-03-22 DIAGNOSIS — D62 Acute posthemorrhagic anemia: Secondary | ICD-10-CM

## 2023-03-22 DIAGNOSIS — I5043 Acute on chronic combined systolic (congestive) and diastolic (congestive) heart failure: Secondary | ICD-10-CM

## 2023-03-22 DIAGNOSIS — I4891 Unspecified atrial fibrillation: Secondary | ICD-10-CM

## 2023-03-22 DIAGNOSIS — R7303 Prediabetes: Secondary | ICD-10-CM

## 2023-03-22 LAB — PROTIME-INR
INR: 2.4 — ABNORMAL HIGH (ref 0.8–1.2)
Prothrombin Time: 26.3 seconds — ABNORMAL HIGH (ref 11.4–15.2)

## 2023-03-22 LAB — GLUCOSE, CAPILLARY
Glucose-Capillary: 102 mg/dL — ABNORMAL HIGH (ref 70–99)
Glucose-Capillary: 119 mg/dL — ABNORMAL HIGH (ref 70–99)
Glucose-Capillary: 122 mg/dL — ABNORMAL HIGH (ref 70–99)
Glucose-Capillary: 135 mg/dL — ABNORMAL HIGH (ref 70–99)
Glucose-Capillary: 155 mg/dL — ABNORMAL HIGH (ref 70–99)
Glucose-Capillary: 96 mg/dL (ref 70–99)

## 2023-03-22 LAB — LACTATE DEHYDROGENASE: LDH: 270 U/L — ABNORMAL HIGH (ref 98–192)

## 2023-03-22 MED ORDER — PHENYLEPHRINE 80 MCG/ML (10ML) SYRINGE FOR IV PUSH (FOR BLOOD PRESSURE SUPPORT): 80.0000 ug | PREFILLED_SYRINGE | Freq: Once | INTRAVENOUS | Status: DC | PRN

## 2023-03-22 MED ORDER — WARFARIN SODIUM 1 MG PO TABS
1.0000 mg | ORAL_TABLET | Freq: Once | ORAL | Status: AC
  Administered 2023-03-22: 1 mg via ORAL
  Filled 2023-03-22: qty 1

## 2023-03-22 NOTE — Progress Notes (Signed)
Occupational Therapy Session Note  Patient Details  Name: Steven [name] MRN: [id number] Date of Birth: 06-Feb-1956  Today's Date: 03/22/2023 OT Individual Time: 2952-8413 1st Session; [phone] 2nd Session  OT Individual Time Calculation (min): 60 min, 70 min    Short Term Goals: Week 1:  OT Short Term Goal 1 (Week 1): STG's=LTGs d/t LOS  Skilled Therapeutic Interventions/Progress Updates:    Session 1:  Pt seen for skilled OT this am with pt OOB in w/c upon OT arrival. Pt able to amb to dresser with CGA without AD with cues to manage LVAD lines to access soiled laundry and place in bag and move to sink side for am self care routine sponge bath, dressing and grooming. Progression with independence and pacing focus. Pt able to complete UB bathing, and pull over shirt with set up and increased functional time seated level. Peri and buttocks hygiene in standing with CGA. Brief and pants donning with min A/CGA. Stood for oral care 2 sets of 1 min max with CGA. Pt remained oob and amb to recliner for rest with all needs, nurse call button and chair alarm set.   Pain: denies pain   Session 2:  Pt resting in bed with some concerns re: how is family member felt after initial nursing training for LVAD dressing changes. Voiced concern of overwhelm. OT provided reassurance that each time it will most likely become more manageable and provided open communication and support. Pt felt fatigued but agreeable to oob to recliner and demonstrate switching from batteries LVAD to main with increased time and min cues and brief min A to match lines. Discussed directing care and pt able to teach back request for TED hose removal after session. Transferred with close S from bed to chair. OT provided initial UE HEP with beige theraputty issued with grasping, rolling, pinching Bly for 5 min each. Initiated the BORG RPE scale and worked with pt to identify recent activity RPE's. OT used cardiac pillow for simple "cane"  exercises within sternal precaution ranges for scap, sh, elbow 10 reps x 2 sets each. RPE 15-16 ranges. OT assisted pt to remove TED hose via figure 4 technique and apply lotion to feet with min A. Pt pleased with decreased edema from +3 to +2 Bly with TED application. OT educated on LE elevation. Left pt recliner level with needs, nurse call button and chair alarm in reach. HR between 74-82 bpm throughout session.   Pain: denies pain   Therapy Documentation Precautions:  Precautions Precautions: Sternal, Other (comment) Restrictions Weight Bearing Restrictions: Yes RUE Weight Bearing: Non weight bearing LUE Weight Bearing: Non weight bearing Other Position/Activity Restrictions: sternal precautions only   Therapy/Group: Individual Therapy  Vicenta Dunning 03/22/2023, 7:30 AM

## 2023-03-22 NOTE — Progress Notes (Signed)
ANTICOAGULATION CONSULT NOTE - Follow-up  Pharmacy Consult for warfarin management Indication:  LVAD  No Known Allergies  Patient Measurements: Height: 5\' 9"  (175.3 cm) Weight: 68.2 kg (150 lb 5.7 oz) IBW/kg (Calculated) : 70.7 Heparin Dosing Weight: 73.5 kg  Vital Signs: Temp: 98.8 F (37.1 C) (09/26 1348) BP: 101/89 (09/26 1348) Pulse Rate: 79 (09/26 1348)  Labs: Recent Labs    03/20/23 0530 03/20/23 0548 03/21/23 0448 03/22/23 0323  HGB  --  9.2* 9.2*  --   HCT  --  28.5* 29.0*  --   PLT  --  464* 528*  --   LABPROT 28.2*  --  26.9* 26.3*  INR 2.6*  --  2.5* 2.4*  CREATININE 1.12  --  1.11  --     Estimated Creatinine Clearance: 61.4 mL/min (by C-G formula based on SCr of 1.11 mg/dL).   Medical History: Past Medical History:  Diagnosis Date   Acute MI, anterior wall (HCC)    AICD (automatic cardioverter/defibrillator) present    CAD (coronary artery disease)    2D ECHO, 07/13/2011 - EF <25%, LV moderatelty dilated, LA moderately dilatedLEXISCAN, 12/14/2011 - moderate-severe perfusion defect seen in the basal anteroseptal, mid anterior, apicacl anterior, apical, apical inferior, and apical lateral regions, post-stress EF 25%, new EKG changes from baseline abnormalities   Cancer (HCC)    Prostate   CHF (congestive heart failure) (HCC) 2012   Hypertension 08/08/2021   Inguinal hernia, left    Pneumonia    November 2023   Pre-diabetes     Medications:  Medications Prior to Admission  Medication Sig Dispense Refill Last Dose   acetaminophen (TYLENOL) 500 MG tablet Take 1,000 mg by mouth every 6 (six) hours as needed for mild pain.      amiodarone (PACERONE) 200 MG tablet Take 1 tablet (200 mg total) by mouth 2 (two) times daily.      aspirin EC 81 MG tablet Take 1 tablet (81 mg total) by mouth daily. Swallow whole.      atorvastatin (LIPITOR) 80 MG tablet Take 1 tablet (80 mg total) by mouth daily. 30 tablet 3    bisacodyl (DULCOLAX) 5 MG EC tablet Take 2  tablets (10 mg total) by mouth daily.      [EXPIRED] cefTRIAXone 2 g in sodium chloride 0.9 % 100 mL Inject 2 g into the vein daily for 1 day.      Cholecalciferol (VITAMIN D) 125 MCG (5000 UT) CAPS Take 5,000 Units by mouth daily.      docusate sodium (COLACE) 100 MG capsule Take 2 capsules (200 mg total) by mouth daily.      finasteride (PROSCAR) 5 MG tablet Take 1 tablet (5 mg total) by mouth daily.      insulin aspart (NOVOLOG) 100 UNIT/ML injection Inject 0-24 Units into the skin 3 (three) times daily with meals.      losartan (COZAAR) 25 MG tablet Take 1 tablet (25 mg total) by mouth daily.      Multiple Vitamin (MULTIVITAMIN) tablet Take 1 tablet by mouth daily.      pantoprazole (PROTONIX) 40 MG tablet Take 1 tablet (40 mg total) by mouth daily.      spironolactone (ALDACTONE) 25 MG tablet Take 0.5 tablets (12.5 mg total) by mouth daily.      tamsulosin (FLOMAX) 0.4 MG CAPS capsule Take 1 capsule (0.4 mg total) by mouth daily after supper.      torsemide (DEMADEX) 20 MG tablet Take 1 tablet (20 mg  total) by mouth daily as needed. Take 1 tablet by mouth every other day.      traMADol (ULTRAM) 50 MG tablet Take 1-2 tablets (50-100 mg total) by mouth every 4 (four) hours as needed for moderate pain.      traZODone (DESYREL) 50 MG tablet Take 1 tablet (50 mg total) by mouth at bedtime as needed for sleep.      warfarin (COUMADIN) 1 MG tablet Take 1 tablet (1 mg total) by mouth one time only at 4 PM.      Scheduled:   amiodarone  200 mg Oral BID   aspirin EC  81 mg Oral Daily   atorvastatin  80 mg Oral Daily   bisacodyl  10 mg Oral Daily   Or   bisacodyl  10 mg Rectal Daily   Chlorhexidine Gluconate Cloth  6 each Topical BID   docusate sodium  200 mg Oral Daily   feeding supplement  237 mL Oral Q24H   finasteride  5 mg Oral Daily   insulin aspart  0-24 Units Subcutaneous TID WC   multivitamin with minerals  1 tablet Oral Daily   pantoprazole  40 mg Oral Daily   polyethylene glycol   17 g Oral Daily   sodium chloride flush  10-40 mL Intracatheter Q12H   tamsulosin  0.4 mg Oral QPC supper   warfarin  1 mg Oral ONCE-1600   Warfarin - Pharmacist Dosing Inpatient   Does not apply q1600   Infusions:   sodium chloride Stopped (03/20/23 2025)   PRN: sodium chloride, guaiFENesin, oxyCODONE, phenylephrine, traMADol, traZODone  Assessment: Pt presented to [facility] ED on 02/26/2023 with shortness of breath and SpO2 of 78 on room air. Pt has PMH of ICM (LAD infarct), HFrEF (EF <20%), LVT, VT s/p ICD, HTN, CKD3, HLD Pt underwent HMIII implantation on 9/12. Warfarin was initiated 9/15.   INR today is therapeutic at 2.4, patient is eating well. CBC and LDH stable.  Goal of Therapy:  2.0-2.5 Monitor platelets by anticoagulation protocol: Yes   Plan:  Warfarin 1 mg PO x1 Daily INR, CBC, LDH  Wilmer Floor, PharmD PGY2 Cardiology Pharmacy Resident Please check AMION for all [facility] Pharmacy numbers 03/22/2023

## 2023-03-22 NOTE — Progress Notes (Signed)
Patient ID: Steven [name], male   DOB: 05-31-1956, 67 y.o.   MRN: [id number]  [facility] has accepted pt's referral for PT, OT RN services.

## 2023-03-22 NOTE — Progress Notes (Signed)
LVAD Coordinator Rounding Note:  Admitted 02/26/23 due to CHF.   HM3 LVAD implanted on 03/08/23 by Dr Laneta Simmers under DT criteria.  9/12: HM3 LVAD Implant. Intra-op TEE LVEF 15%, RV normal  9/13: Extubated.  Driveline pulled back, 1 inch of Velour exposed.  9/14: Went into atrial fibrillation with RVR,  IV amiodarone started.  9/15: Hematuria.  9/17: Urology consulted for hematuria. Proscar added. Given 1 unit of blood. Ramp Echo speed increased to 5500. 03/20/23: Discharged to CIR  Pt working with OT in his room on my arrival. Denies complaints. He is looking forward to possibly discharging home on Tuesday. Still with productive cough. Encouraged to use IS hourly. He verbalized understanding.   VAD home equipment ordered 9/25.   See VAD education with Suzette Battiest documentation below.   Vital signs: Temp: 98.7 HR: 80 paced Doppler Pressure: 80 Auto BP: 92/57 (70) O2 Sat: 100% on RA Wt: 155.6>169.5>162>153.4>159.3>153.4>151.9>148.8>149.7>150.1>150.3 lbs    LVAD interrogation reveals:  Speed: 5500 Flow: 4.3 Power: 4.1 w  PI: 3.1  Alarms: none  Events:  36 PI events so far today Hematocrit: 29  Fixed speed: 5500 Low speed limit: 5200   Drive Line:  Veronica observed dressing change today. Existing VAD dressing removed and site care performed using sterile technique. Drive line exit site cleaned with Chlora prep applicators x 2, allowed to dry, and gauze dressing with Silver strip applied. There is a single suture present. The driveline has been pulled out approx 1". Exit site unincorporated, the velour is now exposed approx 1" at exit site. Scant dried bloody drainage, no redness, tenderness, foul odor or rash noted. Drive line anchor re-applied. Pt denies fever or chills. Advance to twice weekly dressing changes on Monday & Thursday. Next dressing change due 03/26/23 by nurse champion or VAD coordinator.       Labs:  LDH trend: 234>310>274>302>318>306>285>310>386>270  INR trend:  1.6>1.5>1.6>1.5>1.7>2.3>2.6>2.3>2.5>2.4  Anticoagulation Plan: -INR Goal: 2-2.5 -ASA Dose: 81 mg   Blood Products:  -Intra op: 03/08/23 1 PRBC 2 FFP 1 PLT 1 Cryo 450 cell saver  Post op: 03/08/23 1 PRBC 2 FFP 1 PLT 1 Cryo  03/09/23: 1 PRBC 03/13/23: 1 PRBC  Device: -Medtronic -Therapies: ON  -VF > 200BMP  Arrythmias: hx afib- currently paced 80  Respiratory: on RA  Infection:   Renal:  -CRT: 1.28>1.72>1.76>1.63>1.37>1.17>1.12>1.11  Patient Education: All discharge education completed at bedside 03/19/23 with pt. Please see separate note for details. Veronica observed dressing change today. At this time she feels like performing dressing changes are too much for her to handle with her anxiety disorder. She is more than willing to bring pt to clinic as many times a week as he needs dressing changes for VAD coordinators to perform. Has [name] program VAD pager and VAD clinic numbers into her cell phone. She successfully paged VAD pager. Discussed when to call clinic and when to call pager.  Demonstrated power source changes for [name]. Veronica performed power source change (wall power to batteries) independently with VAD coordinator verbal cues. Discussed importance of black bag and it's components with [name].   Adverse Events on VAD:  Plan/Recommendations:  1. Please page VAD coordinator with any equipment issues or driveline problems. 2. Continue MWF dressing changes using silver strip by VAD coordinator or nurse champion. Next dressing due 03/26/23.  Alyce Pagan RN VAD Coordinator  Office: [phone]  24/7 Pager: [phone]

## 2023-03-22 NOTE — Progress Notes (Addendum)
Physical Therapy Session Note  Patient Details  Name: Steven [name] MRN: [id number] Date of Birth: February 07, 1956  Today's Date: 03/22/2023 PT Individual Time: 8657-8469 PT Individual Time Calculation (min): 72 min   Short Term Goals: Week 1:  PT Short Term Goal 1 (Week 1): = to LTGs based on ELOS  Skilled Therapeutic Interventions/Progress Updates:      Pt seated in recliner upon arrival. Pt agreeable to therapy. Pt denies any pain.   Therapist allowing pt to direct his care needs for moving LVAD from wall to battery power and battery power to wall at end of session - pt able to complete with light min A and cuing to untangle the lines - requires increased time and moderate questioning and minimal instructional cues. With mod question cuing pt able to recall what he needed in his back-up bag for the LVAD, and for returning all batteries to charger at end of session  Pt will benefit from additional training/practice with managing his LVAD. Pt donned vest for batteries with supervision/min A, verbal cues provided for technique for maintenance of sternal precautions.   Patient demonstrates increased fall risk as noted by score of   39/56 on Berg Balance Scale.  (<36= high risk for falls, close to 100%; 37-45 significant >80%; 46-51 moderate >50%; 52-55 lower >25%. Pt unable to complete reaching for object off of floor, or reaching with forward outstretched arms 2/2 sternal precautions.  Pt performed lateral stepping, and backwards stepping x 35 feet B with no AD and CGA/min A. Pt ambulated from room to main gym, and main gym to room with no AD and CGA/close supervision, verbal cues for safety to not crossing legs with navigating turns 2/2 mild LOB.   Education provided for technique for opening doors while maintaining precuaitons. Pt returned demonstration, and performed throughout session with mod cues.   Pt seated in recliner with all needs within reach and seatbelt alarm on.     Therapy  Documentation Precautions:  Precautions Precautions: Sternal, Other (comment) Restrictions Weight Bearing Restrictions: Yes RUE Weight Bearing: Non weight bearing LUE Weight Bearing: Non weight bearing Other Position/Activity Restrictions: sternal precautions only Balance: Standardized Balance Assessment Standardized Balance Assessment: Berg Balance Test Berg Balance Test Sit to Stand: Able to stand without using hands and stabilize independently Standing Unsupported: Able to stand safely 2 minutes Sitting with Back Unsupported but Feet Supported on Floor or Stool: Able to sit safely and securely 2 minutes Stand to Sit: Sits safely with minimal use of hands Transfers: Able to transfer with verbal cueing and /or supervision Standing Unsupported with Eyes Closed: Able to stand 10 seconds with supervision Standing Ubsupported with Feet Together: Able to place feet together independently and stand 1 minute safely From Standing, Reach Forward with Outstretched Arm: Loses balance while trying/requires external support (sternal precautions) From Standing Position, Pick up Object from Floor: Able to pick up shoe, needs supervision From Standing Position, Turn to Look Behind Over each Shoulder: Looks behind from both sides and weight shifts well Turn 360 Degrees: Needs close supervision or verbal cueing Standing Unsupported, Alternately Place Feet on Step/Stool: Able to stand independently and complete 8 steps >20 seconds Standing Unsupported, One Foot in Front: Able to plae foot ahead of the other independently and hold 30 seconds Standing on One Leg: Tries to lift leg/unable to hold 3 seconds but remains standing independently Total Score: 40  Therapy/Group: Individual Therapy  [facility] Ambrose [place], [place], DPT  03/22/2023, 11:43 AM

## 2023-03-22 NOTE — Progress Notes (Addendum)
Advanced Heart Failure VAD Team Note  PCP-Cardiologist: Nicki Guadalajara, MD  AHF: Dr. Gala Romney   Subjective:    9/12: s/p HM3 LVAD 9/24: discharged to CIR  9/25: Innumerable PIs on VAD. Suspected to be dry. Stopped spiro and losartan. Given LR bolus 500 cc  Feels ok this morning. MAP 80s. Denies dizziness/lightheadedness. Trying to eat more. Mild SOB w/ activity. No resting dyspnea.    LVAD INTERROGATION:  HeartMate III LVAD:   Flow 4.6 liters/min, speed 5500, power 4.1, PI 3.1.  Multiple PI events.   Objective:    Vital Signs:   Temp:  [97.5 F (36.4 C)-98.9 F (37.2 C)] 98.7 F (37.1 C) (09/26 0757) Pulse Rate:  [59-81] 80 (09/26 0846) Resp:  [16-20] 19 (09/26 0757) BP: (80-119)/(45-85) 80/58 (09/26 0846) SpO2:  [100 %] 100 % (09/26 0757) Weight:  [68.1 kg-68.2 kg] 68.2 kg (09/26 0520) Last BM Date : 03/21/23 Mean arterial Pressure MAP 80  Intake/Output:   Intake/Output Summary (Last 24 hours) at 03/22/2023 0927 Last data filed at 03/22/2023 0511 Gross per 24 hour  Intake 480 ml  Output 1600 ml  Net -1120 ml     Physical Exam    General:  thin fatigued appearing. No resp difficulty HEENT: normal Neck: supple. JVP 6- 7 cm . Carotids 2+ bilat; no bruits. No lymphadenopathy or thyromegaly appreciated. Cor: Mechanical heart sounds with LVAD hum present. Lungs: clear, no wheezing  Abdomen: soft, nontender, nondistended. No hepatosplenomegaly. No bruits or masses. Good bowel sounds. Driveline: C/D/I; securement device intact and driveline incorporated Extremities: no cyanosis, clubbing, rash, edema + RUE PICC  +TED hoses  Neuro: alert & orientedx3, cranial nerves grossly intact. moves all 4 extremities w/o difficulty. Affect pleasant   Telemetry   N/A   EKG    No new 12 lead to review   Labs   Basic Metabolic Panel: Recent Labs  Lab 03/17/23 0510 03/18/23 0435 03/19/23 0530 03/20/23 0530 03/21/23 0448  NA 132* 130* 131* 128* 131*  K 4.0 3.7 4.0  4.0 4.2  CL 98 93* 98 98 99  CO2 25 25 25  21* 21*  GLUCOSE 103* 100* 99 109* 80  BUN 25* 22 19 20 19   CREATININE 1.44* 1.33* 1.17 1.12 1.11  CALCIUM 8.6* 8.6* 8.2* 7.9* 8.5*  MG  --   --  2.1 2.0  --     Liver Function Tests: Recent Labs  Lab 03/17/23 0510 03/21/23 0448  AST 139* 112*  ALT 103* 131*  ALKPHOS 70 82  BILITOT 1.4* 1.1  PROT 6.3* 6.2*  ALBUMIN 2.7* 2.5*   No results for input(s): "LIPASE", "AMYLASE" in the last 168 hours. No results for input(s): "AMMONIA" in the last 168 hours.  CBC: Recent Labs  Lab 03/17/23 0510 03/18/23 0435 03/19/23 0530 03/20/23 0548 03/21/23 0448  WBC 16.8* 17.4* 17.5* 18.0* 15.4*  NEUTROABS  --   --   --   --  13.0*  HGB 8.8* 9.3* 9.2* 9.2* 9.2*  HCT 27.7* 28.8* 28.9* 28.5* 29.0*  MCV 94.5 95.7 94.1 94.1 93.9  PLT 346 426* 439* 464* 528*    INR: Recent Labs  Lab 03/18/23 0435 03/19/23 0530 03/20/23 0530 03/21/23 0448 03/22/23 0323  INR 2.2* 2.3* 2.6* 2.5* 2.4*    Other results: EKG:    Imaging   No results found.   Medications:     Scheduled Medications:  amiodarone  200 mg Oral BID   aspirin EC  81 mg Oral Daily   atorvastatin  80 mg Oral Daily   bisacodyl  10 mg Oral Daily   Or   bisacodyl  10 mg Rectal Daily   Chlorhexidine Gluconate Cloth  6 each Topical BID   docusate sodium  200 mg Oral Daily   feeding supplement  237 mL Oral Q24H   finasteride  5 mg Oral Daily   insulin aspart  0-24 Units Subcutaneous TID WC   multivitamin with minerals  1 tablet Oral Daily   pantoprazole  40 mg Oral Daily   polyethylene glycol  17 g Oral Daily   sodium chloride flush  10-40 mL Intracatheter Q12H   tamsulosin  0.4 mg Oral QPC supper   warfarin  1 mg Oral ONCE-1600   Warfarin - Pharmacist Dosing Inpatient   Does not apply q1600    Infusions:  sodium chloride Stopped (03/20/23 2025)    PRN Medications: sodium chloride, guaiFENesin, oxyCODONE, traMADol, traZODone   Patient Profile   67 y/o male w/  chronic systolic heart failure due to ICM 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 Now s/p HM3 LVAD 9/12. Discharged to CIR on 9/24    Assessment/Plan:    1. Acute on chronic combined systolic and diastolic heart failure>>Low Output S/p HM3 LVAD on 9/12.  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 NYHA IV on admission.  Echo 02/27/23: EF <20%, LV with GHK, RV mildly reduced, GIIDD, LA mod dilated, mild MR.  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 GDMT limited by renal function and hypotension.  S/P HM3 LVAD implant 9/12.  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 Intra-op TEE LVEF 15%, RV normal.  - Ramp ECHO 03/13/23. Speed increased to 5500. Pain controlled with tylenol.  - Innumerable PIs on VAD yesterday. Suspected to be dry. Stopped spiro and losartan. Given LR bolus 500 cc - MAP 80s. Volume status ok.  - Off GDMT w/ soft BPs  - volume status ok, torsemide PRN (does not need today)  - LDH stable - INR 2.4. Discussed dosing with PharmD personally. - Continue 81 mg ASA  2. 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  -  no s/s angina - on ASA/statin   3. AKI on CKD stage 3: Creatinine baseline 1.6-1.8 - cardiorenal, resolved w/ LVAD support - avoid hypotension. Holding GDMT per above   4. Mural thrombus: on Warfarin.  - INR 2.4   5. 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 Anemia: Post-op. S/p 1UPRBCs.  - Hgb stable 9.2   7. Atrial fibrillation:  - continue amio 200 mg twice a day   - On coumadin. INR 2.4  8. Hematuria:  - Urology consulted. Started on proscar.  - Suspect related to trauma with foley insertion.  - resolved, Hgb stable. Continue to follow.   9. UTI  - scheduled for last dose of ceftriaxone today  - WBCs down trending.   10. Deconditioning:  - CIR. Appreciate their assistance    I reviewed the LVAD parameters from today,  and compared the results to the patient's prior recorded data.  No programming changes were made.  The LVAD is functioning within specified parameters.  The patient performs LVAD self-test daily.  LVAD interrogation was negative for any significant power changes, alarms or PI events/speed drops.  LVAD equipment check completed and is in good working order.  Back-up equipment present.   LVAD education done on  emergency procedures and precautions and reviewed exit site care.  Length of Stay: 2  Knute Neu 03/22/2023, 9:27 AM  VAD Team --- VAD ISSUES ONLY--- Pager [phone] (7am - 7am)  Advanced Heart Failure Team  Pager [phone] (M-F; 7a - 5p)  Please contact [facility] for night-coverage after hours (5p -7a ) and weekends on [email or web address]  Patient seen and examined with the above-signed Advanced Practice Provider and/or Housestaff. I personally reviewed laboratory data, imaging studies and relevant notes. I independently examined the patient and formulated the important aspects of the plan. I have edited the note to reflect any of my changes or salient points. I have personally discussed the plan with the patient and/or family.  Continues to work with CIR. Feels better after getting IV fluids yesterday. No dizziness.  INR 2.4 No bleeding  General:  NAD.  HEENT: normal  Neck: supple. JVP not elevated.  Carotids 2+ bilat; no bruits. No lymphadenopathy or thryomegaly appreciated. Cor: LVAD hum.  Lungs: Clear. Abdomen: soft, nontender, non-distended. No hepatosplenomegaly. No bruits or masses. Good bowel sounds. Driveline site clean. Anchor in place.  Extremities: no cyanosis, clubbing, rash. Warm tr edema  Neuro: alert & oriented x 3. No focal deficits. Moves all 4 without problem   Making progress with CIR. Diuretics and GDMT held yesterday due to low MAPs and frequent PI events. Also give IVF.   Still having relatively frequent PI events but feeling better. Continue current  meds.   INR 2.4 Discussed dosing with PharmD personally.  Arvilla Meres, MD  3:00 PM

## 2023-03-22 NOTE — Progress Notes (Signed)
Patient had a quite night. LVAD functional and Data input in EHR. Denies pain, SOB, dizziness or headaches. Remains alert and oriented,  continent of B/B. Safety maintained at all times.

## 2023-03-22 NOTE — Progress Notes (Addendum)
PROGRESS NOTE   Subjective/Complaints: No new concerns elicited this morning.  No vents overnight.  Denies pain other than chest wall soreness when he coughs.  Was given some IVF fluids by heart failure team yesterday.  Review of systems: Denies chest pain at rest, has chest wall soreness when he coughs.  Baseline shortness of breath.   Denies abdominal pain, nausea, vomiting. + Leg swelling-improved  Objective:   No results found. Recent Labs    03/20/23 0548 03/21/23 0448  WBC 18.0* 15.4*  HGB 9.2* 9.2*  HCT 28.5* 29.0*  PLT 464* 528*   Recent Labs    03/20/23 0530 03/21/23 0448  NA 128* 131*  K 4.0 4.2  CL 98 99  CO2 21* 21*  GLUCOSE 109* 80  BUN 20 19  CREATININE 1.12 1.11  CALCIUM 7.9* 8.5*    Intake/Output Summary (Last 24 hours) at 03/22/2023 1749 Last data filed at 03/22/2023 1449 Gross per 24 hour  Intake 600 ml  Output 2250 ml  Net -1650 ml        Physical Exam: Vital Signs Blood pressure 110/73, pulse 82, temperature 98 F (36.7 C), resp. rate 19, height 5\' 9"  (1.753 m), weight 68.2 kg, SpO2 100%.   General: NAD, chronically ill-appearing HEENT: Head is normocephalic, atraumatic, MMM Neck: Supple  Heart: LVAD hum Chest: Decreased breath sounds at bilateral bases, nonlabored breathing Abdomen: Soft, non-tender, non-distended, bowel sounds positive. Extremities: + B/L trace LE edema, wearing TED hose Psych: Pt's affect is a little flat.  Cooperative Skin: Clean and intact without signs of breakdown Neuro:   Patient is alert sitting up in bed.  Follows commands.  Oriented x 3. Normal language, speech. Functional memory and awareness. UE motor 4/5. LE motor 3+/5 HF, KE 4/5, ADF/PF 4+/5. No sensory deficits appreciated. Normal muscle tone.  Cranial nerves II through XII grossly intact. Musculoskeletal: No joint swelling noted   Assessment/Plan: 1. Functional deficits which require 3+ hours  per day of interdisciplinary therapy in a comprehensive inpatient rehab setting. Physiatrist is providing close team supervision and 24 hour management of active medical problems listed below. Physiatrist and rehab team continue to assess barriers to discharge/monitor patient progress toward functional and medical goals  Care Tool:  Bathing    Body parts bathed by patient: Right arm, Left arm, Chest, Abdomen, Front perineal area, Right upper leg, Left upper leg, Face   Body parts bathed by helper: Right lower leg, Left lower leg     Bathing assist Assist Level: Minimal Assistance - Patient > 75%     Upper Body Dressing/Undressing Upper body dressing   What is the patient wearing?: Pull over shirt    Upper body assist Assist Level: Contact Guard/Touching assist    Lower Body Dressing/Undressing Lower body dressing      What is the patient wearing?: Pants, Incontinence brief     Lower body assist Assist for lower body dressing: Moderate Assistance - Patient 50 - 74%     Toileting Toileting    Toileting assist Assist for toileting: Minimal Assistance - Patient > 75%     Transfers Chair/bed transfer  Transfers assist     Chair/bed transfer assist  level: Minimal Assistance - Patient > 75%     Locomotion Ambulation   Ambulation assist      Assist level: Minimal Assistance - Patient > 75% Assistive device: No Device Max distance: 171ft   Walk 10 feet activity   Assist     Assist level: Minimal Assistance - Patient > 75% Assistive device: No Device   Walk 50 feet activity   Assist    Assist level: Minimal Assistance - Patient > 75% Assistive device: No Device    Walk 150 feet activity   Assist    Assist level: Minimal Assistance - Patient > 75% Assistive device: No Device    Walk 10 feet on uneven surface  activity   Assist Walk 10 feet on uneven surfaces activity did not occur: Safety/medical concerns          Wheelchair     Assist Is the patient using a wheelchair?: No             Wheelchair 50 feet with 2 turns activity    Assist            Wheelchair 150 feet activity     Assist          Blood pressure 110/73, pulse 82, temperature 98 F (36.7 C), resp. rate 19, height 5\' 9"  (1.753 m), weight 68.2 kg, SpO2 100%.  Medical Problem List and Plan: 1. Functional deficits secondary to debility after heart failure/ LVAD implant 9/12.   -Sternal precautions             -patient may not yet shower             -ELOS/Goals: 7-10 days, mod I goals with PT/OT  -Continue CIR 2.  Antithrombotics: -DVT/anticoagulation:  Pharmaceutical: Coumadin             -antiplatelet therapy: Aspirin 81 mg daily 3. Pain Management: Oxycodone/tramadol as needed 4. Mood/Behavior/Sleep: N/A             -antipsychotic agents: N/A 5. Neuropsych/cognition: This patient is capable of making decisions on his own behalf. 6. Skin/Wound Care: Routine skin checks             -local care to drive site  7. Fluids/Electrolytes/Nutrition: encourage PO             -Hyponatremia, 9/25 stable at 131 continue to monitor 8.  Acute on chronic combined systolic and diastolic congestive heart failure.  Follow-up per heart failure team.  Continue Aldactone 12.5 mg daily as well as Entresto 24-26 mg twice daily             -daily weights  -9/26 weight stable, lower extremity edema improved, he did get some fluids yesterday for hypotension  Filed Weights   03/20/23 1608 03/21/23 1108 03/22/23 0520  Weight: 71.5 kg 68.1 kg 68.2 kg    9.  Acute blood loss anemia.  Follow-up CBC  -9/26 Hgb stable at 9.2 yesterday 10.  Hematuria/UTI/persistent leukocytosis.  Placed on Proscar per urology services.  Complete empiric course of Rocephin.  -9/25 WBC down to 15.4. 11.  Hyperlipidemia.  Lipitor 12.  Prediabetes.  Hemoglobin A1c 6.6.  SSI  -9/26 CBGs well-controlled, continue to monitor for now 13.  CKD stage  III a?  Follow-up chemistries             -9/25 creatinine stable at 1.11 14.  Atrial fibrillation.  Amiodarone 200 mg twice daily.  Continue Coumadin  -9/26 INR 2.4 15.  Transaminitis.   -  Suspect cardiac related.  Stable from prior.  Avoid hepatotoxic medications if possible.  LOS: 2 days A FACE TO FACE EVALUATION WAS PERFORMED  Fanny Dance 03/22/2023, 5:49 PM

## 2023-03-23 DIAGNOSIS — F4321 Adjustment disorder with depressed mood: Secondary | ICD-10-CM

## 2023-03-23 LAB — CBC
HCT: 28 % — ABNORMAL LOW (ref 39.0–52.0)
Hemoglobin: 9 g/dL — ABNORMAL LOW (ref 13.0–17.0)
MCH: 30.5 pg (ref 26.0–34.0)
MCHC: 32.1 g/dL (ref 30.0–36.0)
MCV: 94.9 fL (ref 80.0–100.0)
Platelets: 503 10*3/uL — ABNORMAL HIGH (ref 150–400)
RBC: 2.95 MIL/uL — ABNORMAL LOW (ref 4.22–5.81)
RDW: 14.7 % (ref 11.5–15.5)
WBC: 12.9 10*3/uL — ABNORMAL HIGH (ref 4.0–10.5)
nRBC: 0 % (ref 0.0–0.2)

## 2023-03-23 LAB — GLUCOSE, CAPILLARY
Glucose-Capillary: 105 mg/dL — ABNORMAL HIGH (ref 70–99)
Glucose-Capillary: 119 mg/dL — ABNORMAL HIGH (ref 70–99)
Glucose-Capillary: 120 mg/dL — ABNORMAL HIGH (ref 70–99)
Glucose-Capillary: 123 mg/dL — ABNORMAL HIGH (ref 70–99)
Glucose-Capillary: 136 mg/dL — ABNORMAL HIGH (ref 70–99)
Glucose-Capillary: 147 mg/dL — ABNORMAL HIGH (ref 70–99)

## 2023-03-23 LAB — COOXEMETRY PANEL
Carboxyhemoglobin: 1.6 % — ABNORMAL HIGH (ref 0.5–1.5)
Methemoglobin: <0.7 % (ref 0.0–1.5)
O2 Saturation: 71.6 %
Total hemoglobin: 9.8 g/dL — ABNORMAL LOW (ref 12.0–16.0)

## 2023-03-23 LAB — MAGNESIUM: Magnesium: 2 mg/dL (ref 1.7–2.4)

## 2023-03-23 LAB — LACTATE DEHYDROGENASE: LDH: 285 U/L — ABNORMAL HIGH (ref 98–192)

## 2023-03-23 LAB — PROTIME-INR
INR: 2.1 — ABNORMAL HIGH (ref 0.8–1.2)
Prothrombin Time: 23.7 s — ABNORMAL HIGH (ref 11.4–15.2)

## 2023-03-23 MED ORDER — WARFARIN SODIUM 2.5 MG PO TABS
2.5000 mg | ORAL_TABLET | Freq: Once | ORAL | Status: AC
  Administered 2023-03-23: 2.5 mg via ORAL
  Filled 2023-03-23: qty 1

## 2023-03-23 MED ORDER — HYDROCOD POLI-CHLORPHE POLI ER 10-8 MG/5ML PO SUER
5.0000 mL | Freq: Two times a day (BID) | ORAL | Status: AC
  Administered 2023-03-23 – 2023-03-25 (×6): 5 mL via ORAL
  Filled 2023-03-23 (×7): qty 5

## 2023-03-23 NOTE — IPOC Note (Signed)
Overall Plan of Care [facility]) Patient Details Name: Steven [name] MRN: [id number] DOB: [birth year]  Admitting Diagnosis: Debility  Hospital Problems: Principal Problem:   Debility Active Problems:   ABLA (acute blood loss anemia)   Prediabetes   Acute on chronic combined systolic and diastolic heart failure (HCC)   Adjustment disorder with depressed mood     Functional Problem List: Nursing Medication Management, Pain, Safety, Skin Integrity, Endurance, Bladder  PT Balance, Sensory, Endurance, Motor, Edema  OT Balance, Edema, Endurance, Sensory, Motor, Skin Integrity  SLP    TR         Basic ADL's: OT Grooming, Bathing, Dressing, Toileting     Advanced  ADL's: OT Simple Meal Preparation, Light Housekeeping, Laundry     Transfers: PT Bed Mobility, Car, Bed to Chair, Occupational psychologist, Research scientist (life sciences): PT Ambulation, Stairs     Additional Impairments: OT    SLP        TR      Anticipated Outcomes Item Anticipated Outcome  Self Feeding indep  Swallowing      Basic self-care  mod I  Toileting  mod I   Bathroom Transfers overall Sup/dist s  Bowel/Bladder  manage bladder w toileting  Transfers  supervision  Locomotion  supervision  Communication     Cognition     Pain  < 4 with prns  Safety/Judgment  manage w cues   Therapy Plan: PT Intensity: Minimum of 1-2 x/day ,45 to 90 minutes PT Frequency: 5 out of 7 days PT Duration Estimated Length of Stay: ~7 days OT Intensity: Minimum of 1-2 x/day, 45 to 90 minutes OT Frequency: 5 out of 7 days OT Duration/Estimated Length of Stay: 7 days     Team Interventions: Nursing Interventions Patient/Family Education, Pain Management, Medication Management, Bladder Management, Discharge Planning, Skin Care/Wound Management, Disease Management/Prevention  PT interventions Ambulation/gait training, Cognitive remediation/compensation, Discharge planning, DME/adaptive equipment instruction, Functional  mobility training, Pain management, Psychosocial support, Therapeutic Activities, UE/LE Strength taining/ROM, Warden/ranger, Community reintegration, Disease management/prevention, Neuromuscular re-education, Patient/family education, Skin care/wound management, Museum/gallery curator, Therapeutic Exercise, UE/LE Coordination activities  OT Interventions Warden/ranger, Cognitive remediation/compensation, Community reintegration, Discharge planning, Disease mangement/prevention, DME/adaptive equipment instruction, Functional electrical stimulation, Functional mobility training, Neuromuscular re-education, Pain management, Patient/family education, Psychosocial support, Self Care/advanced ADL retraining, Skin care/wound managment, Splinting/orthotics, Therapeutic Activities, Therapeutic Exercise, UE/LE Strength taining/ROM, UE/LE Coordination activities, Visual/perceptual remediation/compensation, Wheelchair propulsion/positioning  SLP Interventions    TR Interventions    SW/CM Interventions Discharge Planning, Psychosocial Support, Patient/Family Education   Barriers to Discharge MD  Medical stability  Nursing Decreased caregiver support, Home environment access/layout, Other (comments), Weight bearing restrictions (New LVAD) 2 level flight of steps, right rail; discharge home to pt's house with ex-wife Suzette Battiest) providing supervision at discharge  PT Decreased caregiver support, Other (comments) needs further education on LVAD management  OT Inaccessible home environment, Decreased caregiver support, Home environment access/layout, Other (comments) new LVAD, lives alone FF to bed and bath  SLP      SW       Team Discharge Planning: Destination: PT-Home ,OT- Home , SLP-  Projected Follow-up: PT-Home health PT, 24 hour supervision/assistance, OT-  Home health OT, SLP-  Projected Equipment Needs: PT-To be determined, OT-  , SLP-  Equipment Details: PT- , OT-most likely just a  reacher Patient/family involved in discharge planning: PT- Patient,  OT-Patient, SLP-   MD ELOS: 7-10 Medical Rehab Prognosis:  Good Assessment: The patient has been  admitted for CIR therapies with the diagnosis of debility after heart failure/ LVAD implant 9/12 . The team will be addressing functional mobility, strength, stamina, balance, safety, adaptive techniques and equipment, self-care, bowel and bladder mgt, patient and caregiver education. Goals have been set at [facility] I PT/OT. Anticipated discharge destination is home.        See Team Conference Notes for weekly updates to the plan of care

## 2023-03-23 NOTE — Progress Notes (Signed)
Occupational Therapy Session Note  Patient Details  Name: Steven [name] MRN: [id number] Date of Birth: 1956/01/31  Today's Date: 03/23/2023 OT Individual Time: 1100-1214 OT Individual Time Calculation (min): 74 min    Short Term Goals: Week 1:  OT Short Term Goal 1 (Week 1): STG's=LTGs d/t LOS  Skilled Therapeutic Interventions/Progress Updates:   Pt seen for skilled OT this am with pt OOB in recliner upon OT arrival. Pt able to amb to dresser with CGA without AD with cues to manage LVAD lines to access toilet in bathroom  with close S. No voiding or BM had. Able to move to sink side for am self care routine sponge bath, dressing and grooming as well as OT use of hair tray for hair washing as pt in bed > 1 month.  Pt able to complete UB bathing, and pull over shirt with set up and increased functional time seated level. Peri and buttocks hygiene in standing with close S. Brief and pants donning as well as slipper socks with close S.  Stood for oral care 2 sets of 1 min max with close S. BORG 14/15 overall with extended sessin with HR 74 bpm and SpO2 99% at end of session.  Pt remained oob and amb to recliner for rest with all needs, nurse call button and chair alarm set.   Pain: denies all pain this visit   Therapy Documentation Precautions:  Precautions Precautions: Sternal, Other (comment) Restrictions Weight Bearing Restrictions: Yes RUE Weight Bearing: Non weight bearing LUE Weight Bearing: Non weight bearing Other Position/Activity Restrictions: sternal precautions only   Therapy/Group: Individual Therapy  Vicenta Dunning 03/23/2023, 7:55 AM

## 2023-03-23 NOTE — Progress Notes (Signed)
Physical Therapy Session Note  Patient Details  Name: Steven [name] MRN: [id number] Date of Birth: 1955-10-15  Today's Date: 03/23/2023 PT Individual Time: [phone] and 1400-1456 PT Individual Time Calculation (min): 57 min  and 56 min Today's Date: 03/23/2023 PT Missed Time: 18 Minutes Missed Time Reason: Nursing care;Other (Comment) (IV team)  Short Term Goals: Week 1:  PT Short Term Goal 1 (Week 1): = to LTGs based on ELOS  Skilled Therapeutic Interventions/Progress Updates:   Treatment Session 1 Received pt sitting in recliner, pt agreeable to PT treatment, and reported pain 5/10 in L shoulder - RN notified and gathered medications while therapist assessed vitals. BP: 88/69, HR 77bpm, and SPO2 100%. Pt asymptomatic and BP reassessed: 96/79 - RN aware.   Increased time spent discussing LVAD management while waiting for RN to clear pt to continue with therapy outside of room (stat order for blood draw). Donned LVAD vest with min A to untangle straps. Pt transitioned battery pack from wall to vest with min cues/assist for placing pack into vest, untangling lines, and positioning strap around neck. Pt able to direct therapist to put back up batteries in bag with min cues. RN present to administer morning medication and IV team arrived. Missed time due to nursing care and IV team arriving.   Upon returning, pt performed all transfers without AD and CGA/close supervision throughout session. Pt ambulated 165ft x 2 trials without AD and CGA to/from dayroom. Pt performed alternating toe taps to 6in step without UE support 2x20 with CGA for balance. Transitioned to blocked practice sit<>stands on Airex 2x10 without UE support with emphasis on quad strength and maintaining sternal precautions. Pt then ambulated additional 393ft without AD and CGA fading to supervision. Took seated then returned to room and transitioned from battery pack to wall with min assist/cues (pt attempting to plug back into  battery pack instead of wall). Concluded session with pt sitting in recliner, needs within reach, and seatbelt alarm on.   Treatment Session 2 Received pt sitting in recliner, pt agreeable to PT treatment, and reported pain 3-4/10 in L shoulder (premedicated). Session with emphasis on LVAD management, functional mobility/transfers, generalized strengthening and endurance, dynamic standing balance/coordination, and gait training. Donned LVAD vest with max A for time management purposes and switched from wall to battery pack with min cues (pt forgetting to connect plug to battery pack before putting battery inside vest) and assist to untangle lines.   Pt performed all transfers without AD and close supervision throughout session. Pt ambulated 126ft x 2 trials without AD and close supervision to/from dayroom. Pt performed seated BLE strengthening on Nustep at workload 4 for 8 minutes for a total of 268 steps with emphasis on cardiovascular endurance. RN arrived to administer medications and pt performed the following activities inside agility ladder with emphasis on dynamic standing balance/coordination: -forward high knee marching x 4 laps with CGA/min A -lateral side stepping x 4 laps with CGA -forward step to with alternating cone tap x 4 laps with min A Pt then ambulated additional 313ft without AD and close supervision - challenged pt with speeding up, slowing down, sudden stops, and 360 degree turns requiring up CGA for balance due to mild unsteadiness. Returned to room and transitioned from battery pack to wall with min cues and assist to manage battery pack while pt transferred back to wall. Concluded session with pt sitting in recliner, needs within reach, and seatbelt alarm on. Cardiology MD present at bedside.   Therapy Documentation  Precautions:  Precautions Precautions: Sternal, Other (comment) Restrictions Weight Bearing Restrictions: Yes RUE Weight Bearing: Non weight bearing LUE Weight  Bearing: Non weight bearing Other Position/Activity Restrictions: sternal precautions only  Therapy/Group: Individual Therapy Marlana Salvage Zaunegger Blima Rich PT, DPT 03/23/2023, 6:52 AM

## 2023-03-23 NOTE — Progress Notes (Addendum)
ANTICOAGULATION CONSULT NOTE - Follow-up  Pharmacy Consult for warfarin management Indication:  LVAD  No Known Allergies  Patient Measurements: Height: 5\' 9"  (175.3 cm) Weight: 68.2 kg (150 lb 5.7 oz) IBW/kg (Calculated) : 70.7 Heparin Dosing Weight: 73.5 kg  Vital Signs: Temp: 98.4 F (36.9 C) (09/27 0400) BP: 96/79 (09/27 0757) Pulse Rate: 69 (09/27 0757)  Labs: Recent Labs    03/21/23 0448 03/22/23 0323 03/23/23 0339  HGB 9.2*  --  9.0*  HCT 29.0*  --  28.0*  PLT 528*  --  503*  LABPROT 26.9* 26.3* 23.7*  INR 2.5* 2.4* 2.1*  CREATININE 1.11  --   --     Estimated Creatinine Clearance: 61.4 mL/min (by C-G formula based on SCr of 1.11 mg/dL).   Medical History: Past Medical History:  Diagnosis Date   Acute MI, anterior wall (HCC)    AICD (automatic cardioverter/defibrillator) present    CAD (coronary artery disease)    2D ECHO, 07/13/2011 - EF <25%, LV moderatelty dilated, LA moderately dilatedLEXISCAN, 12/14/2011 - moderate-severe perfusion defect seen in the basal anteroseptal, mid anterior, apicacl anterior, apical, apical inferior, and apical lateral regions, post-stress EF 25%, new EKG changes from baseline abnormalities   Cancer (HCC)    Prostate   CHF (congestive heart failure) (HCC) 2012   Hypertension 08/08/2021   Inguinal hernia, left    Pneumonia    November 2023   Pre-diabetes     Medications:  Medications Prior to Admission  Medication Sig Dispense Refill Last Dose   acetaminophen (TYLENOL) 500 MG tablet Take 1,000 mg by mouth every 6 (six) hours as needed for mild pain.      amiodarone (PACERONE) 200 MG tablet Take 1 tablet (200 mg total) by mouth 2 (two) times daily.      aspirin EC 81 MG tablet Take 1 tablet (81 mg total) by mouth daily. Swallow whole.      atorvastatin (LIPITOR) 80 MG tablet Take 1 tablet (80 mg total) by mouth daily. 30 tablet 3    bisacodyl (DULCOLAX) 5 MG EC tablet Take 2 tablets (10 mg total) by mouth daily.       [EXPIRED] cefTRIAXone 2 g in sodium chloride 0.9 % 100 mL Inject 2 g into the vein daily for 1 day.      Cholecalciferol (VITAMIN D) 125 MCG (5000 UT) CAPS Take 5,000 Units by mouth daily.      docusate sodium (COLACE) 100 MG capsule Take 2 capsules (200 mg total) by mouth daily.      finasteride (PROSCAR) 5 MG tablet Take 1 tablet (5 mg total) by mouth daily.      insulin aspart (NOVOLOG) 100 UNIT/ML injection Inject 0-24 Units into the skin 3 (three) times daily with meals.      losartan (COZAAR) 25 MG tablet Take 1 tablet (25 mg total) by mouth daily.      Multiple Vitamin (MULTIVITAMIN) tablet Take 1 tablet by mouth daily.      pantoprazole (PROTONIX) 40 MG tablet Take 1 tablet (40 mg total) by mouth daily.      spironolactone (ALDACTONE) 25 MG tablet Take 0.5 tablets (12.5 mg total) by mouth daily.      tamsulosin (FLOMAX) 0.4 MG CAPS capsule Take 1 capsule (0.4 mg total) by mouth daily after supper.      torsemide (DEMADEX) 20 MG tablet Take 1 tablet (20 mg total) by mouth daily as needed. Take 1 tablet by mouth every other day.  traMADol (ULTRAM) 50 MG tablet Take 1-2 tablets (50-100 mg total) by mouth every 4 (four) hours as needed for moderate pain.      traZODone (DESYREL) 50 MG tablet Take 1 tablet (50 mg total) by mouth at bedtime as needed for sleep.      warfarin (COUMADIN) 1 MG tablet Take 1 tablet (1 mg total) by mouth one time only at 4 PM.      Scheduled:   amiodarone  200 mg Oral BID   aspirin EC  81 mg Oral Daily   atorvastatin  80 mg Oral Daily   bisacodyl  10 mg Oral Daily   Or   bisacodyl  10 mg Rectal Daily   Chlorhexidine Gluconate Cloth  6 each Topical BID   docusate sodium  200 mg Oral Daily   feeding supplement  237 mL Oral Q24H   finasteride  5 mg Oral Daily   insulin aspart  0-24 Units Subcutaneous TID WC   multivitamin with minerals  1 tablet Oral Daily   pantoprazole  40 mg Oral Daily   polyethylene glycol  17 g Oral Daily   sodium chloride flush   10-40 mL Intracatheter Q12H   tamsulosin  0.4 mg Oral QPC supper   warfarin  2.5 mg Oral ONCE-1600   Warfarin - Pharmacist Dosing Inpatient   Does not apply q1600   Infusions:   sodium chloride Stopped (03/20/23 2025)   PRN: sodium chloride, guaiFENesin, oxyCODONE, phenylephrine, traMADol, traZODone  Assessment: Pt presented to [facility] ED on 02/26/2023 with shortness of breath and SpO2 of 78 on room air. Pt has PMH of ICM (LAD infarct), HFrEF (EF <20%), LVT, VT s/p ICD, HTN, CKD3, HLD Pt underwent HMIII implantation on 9/12. Warfarin was initiated 9/15.   INR today is therapeutic at 2.1 (-0.3), patient is eating well. CBC and LDH stable.  Goal of Therapy:  2.0-2.5 Monitor platelets by anticoagulation protocol: Yes   Plan:  Warfarin 2.5 mg PO x1 Daily INR, CBC, LDH  Wilmer Floor, PharmD PGY2 Cardiology Pharmacy Resident Please check AMION for all [facility] Pharmacy numbers 03/23/2023

## 2023-03-23 NOTE — Progress Notes (Addendum)
PROGRESS NOTE   Subjective/Complaints: Patient working with OT this morning.  Patient with no new Complaints.  He is a little anxious about going home with LVAD.  Review of systems: Denies chest pain at rest, has chest wall soreness when he coughs.  Baseline shortness of breath mostly with activity.   Denies abdominal pain, nausea, vomiting, headache. + Leg swelling-improved  Objective:   No results found. Recent Labs    03/21/23 0448 03/23/23 0339  WBC 15.4* 12.9*  HGB 9.2* 9.0*  HCT 29.0* 28.0*  PLT 528* 503*   Recent Labs    03/21/23 0448  NA 131*  K 4.2  CL 99  CO2 21*  GLUCOSE 80  BUN 19  CREATININE 1.11  CALCIUM 8.5*    Intake/Output Summary (Last 24 hours) at 03/23/2023 1457 Last data filed at 03/23/2023 1004 Gross per 24 hour  Intake 238 ml  Output 2650 ml  Net -2412 ml        Physical Exam: Vital Signs Blood pressure 90/69, pulse 81, temperature 98.9 F (37.2 C), resp. rate 19, height 5\' 9"  (1.753 m), weight 68.1 kg, SpO2 100%.   General: NAD, chronically ill-appearing HEENT: Head is normocephalic, atraumatic, MMM Neck: Supple  Heart: LVAD hum Chest: Decreased breath sounds at bilateral bases, nonlabored breathing Abdomen: Soft, non-tender, non-distended, bowel sounds positive. Extremities: + B/L trace LE edema, wearing TED hose Psych: Flat affect Skin: Clean and intact without signs of breakdown Neuro:   Patient is alert sitting in chair working with OT.  Follows commands.  Oriented x 3. Normal language, speech. Functional memory and awareness. UE motor 4/5. LE motor 3+/5 HF, KE 4/5, ADF/PF 4+/5. No sensory deficits appreciated. Normal muscle tone.  Cranial nerves II through XII grossly intact. Musculoskeletal: No joint swelling noted   Assessment/Plan: 1. Functional deficits which require 3+ hours per day of interdisciplinary therapy in a comprehensive inpatient rehab  setting. Physiatrist is providing close team supervision and 24 hour management of active medical problems listed below. Physiatrist and rehab team continue to assess barriers to discharge/monitor patient progress toward functional and medical goals  Care Tool:  Bathing    Body parts bathed by patient: Right arm, Left arm, Chest, Abdomen, Front perineal area, Right upper leg, Left upper leg, Face   Body parts bathed by helper: Right lower leg, Left lower leg     Bathing assist Assist Level: Minimal Assistance - Patient > 75%     Upper Body Dressing/Undressing Upper body dressing   What is the patient wearing?: Pull over shirt    Upper body assist Assist Level: Contact Guard/Touching assist    Lower Body Dressing/Undressing Lower body dressing      What is the patient wearing?: Pants, Incontinence brief     Lower body assist Assist for lower body dressing: Moderate Assistance - Patient 50 - 74%     Toileting Toileting    Toileting assist Assist for toileting: Minimal Assistance - Patient > 75%     Transfers Chair/bed transfer  Transfers assist     Chair/bed transfer assist level: Minimal Assistance - Patient > 75%     Locomotion Ambulation   Ambulation assist  Assist level: Minimal Assistance - Patient > 75% Assistive device: No Device Max distance: 119ft   Walk 10 feet activity   Assist     Assist level: Minimal Assistance - Patient > 75% Assistive device: No Device   Walk 50 feet activity   Assist    Assist level: Minimal Assistance - Patient > 75% Assistive device: No Device    Walk 150 feet activity   Assist    Assist level: Minimal Assistance - Patient > 75% Assistive device: No Device    Walk 10 feet on uneven surface  activity   Assist Walk 10 feet on uneven surfaces activity did not occur: Safety/medical concerns         Wheelchair     Assist Is the patient using a wheelchair?: No              Wheelchair 50 feet with 2 turns activity    Assist            Wheelchair 150 feet activity     Assist          Blood pressure 90/69, pulse 81, temperature 98.9 F (37.2 C), resp. rate 19, height 5\' 9"  (1.753 m), weight 68.1 kg, SpO2 100%.  Medical Problem List and Plan: 1. Functional deficits secondary to debility after heart failure/ LVAD implant 9/12.   -Sternal precautions             -patient may not yet shower             -ELOS/Goals: 7-10 days, mod I goals with PT/OT  -Continue CIR  -Estimated discharge 10/1  -Continue education regarding LVAD  -IPOC note completed 2.  Antithrombotics: -DVT/anticoagulation:  Pharmaceutical: Coumadin             -antiplatelet therapy: Aspirin 81 mg daily 3. Pain Management: Oxycodone/tramadol as needed 4. Mood/Behavior/Sleep: N/A             -antipsychotic agents: N/A 5. Neuropsych/cognition: This patient is capable of making decisions on his own behalf.  -Seen by neuropsychology for adjustment disorder 6. Skin/Wound Care: Routine skin checks             -local care to drive site  7. Fluids/Electrolytes/Nutrition: encourage PO             -Hyponatremia, 9/25 stable at 131 continue to monitor  -BMP every 24 hours 8.  Acute on chronic combined systolic and diastolic congestive heart failure.  Follow-up per heart failure team.  Spironolactone and losartan number discontinued by heart failure team             -daily weights  -9/27 weight stable, lower extremity edema appears improved, heart failure team following appreciate assistance    Filed Weights   03/21/23 1108 03/22/23 0520 03/23/23 1056  Weight: 68.1 kg 68.2 kg 68.1 kg    9.  Acute blood loss anemia.  Follow-up CBC  -9/27 hemoglobin stable at 9.8 10.  Hematuria/UTI/persistent leukocytosis.  Placed on Proscar per urology services.  Complete empiric course of Rocephin.  -9/25 WBC down to 15.4. 11.  Hyperlipidemia.  Lipitor 12.  Prediabetes.  Hemoglobin A1c  6.6.  SSI  -9/26 CBGs well-controlled, continue to monitor for now 13.  CKD stage III a?  Follow-up chemistries             -9/25 creatinine stable at 1.11  -9/27 BMP tomorrow 14.  Atrial fibrillation.  Amiodarone 200 mg twice daily.  Continue Coumadin  -9/26 INR 2.4  9/27 heart rate stable, INR 2.1     03/23/2023    1:22 PM 03/23/2023   12:39 PM 03/23/2023   10:56 AM  Vitals with BMI  Weight   150 lbs 2 oz  BMI   22.16  Systolic 90 91   Diastolic 69 79   Pulse 81 71     15.  Transaminitis.   -Suspect cardiac related.  Stable from prior.  Avoid hepatotoxic medications if possible.   LOS: 3 days A FACE TO FACE EVALUATION WAS PERFORMED  Fanny Dance 03/23/2023, 2:57 PM

## 2023-03-23 NOTE — Consult Note (Signed)
Neuropsychological Consultation Comprehensive Inpatient Rehab   Patient:   Steven [name]   DOB:   01-29-1956  MR Number:  [id number]  Location:  [facility] [address] [place] [address] Dept: [phone] Loc: [phone]           Date of Service:   03/23/2023  Start Time:   9 AM End Time:   10 AM  Provider/Observer:  Arley Phenix, Psy.D.       Clinical Neuropsychologist       Billing Code/Service: [phone]  Reason for Service:    Steven [name] is a 67 year old male referred for neuropsychological consultation due to coping and adjustment issues after recently having LVAD placement and currently admitted onto the comprehensive inpatient rehabilitation unit.  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  Patient had surgery for LVAD completed on 03/08/2023 and remained intubated through 03/09/2023.  Patient continues to be followed by heart failure team.  Patient has remained stable overall medically and once therapy evaluations were completed was admitted onto the comprehensive inpatient rehabilitation unit.  The patient displayed a rather flat affect and likely a very stoic and methodical personality style premorbidly existed.  Patient denied significant or severe depressive symptomatology but did acknowledge concerned about his survival and what expectations were going forward as far as risk factors and likelihood for length of survival post vascular surgery.  I left most of those questions to be answered  by his cardiology team.  The patient does acknowledge that he is feeling better as time goes on but continues to be very fatigued.  They have been working on adjusting his LVAD pump and the patient appears to be progressing well medically.  Patient was oriented x 4 and able to describe the general nature of his medical interventions but lacked a great deal of specifics.  He is learning new information and expressive and receptive language abilities appear to be intact.  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 Recent admission 4/21 to 10/18/2022 for heart failure exacerbation per chart review patient lives alone independent prior to admission. Two-level home bed bath upstairs. Presented 02/26/2023 with dyspnea. Chest x-ray showed severe diffuse bilateral airspace disease, worse on the right and improving on the left. Admission chemistries unremarkable except BNP 1060, potassium 8.3. Echocardiogram with ejection fraction less than 20%. Left ventricle demonstrating severe decreased function. Cardiology follow-up for acute on chronic combined systolic and diastolic congestive heart failure. Underwent cardiac catheterization showing normal RA pressure, mild pulmonary hypertension mildly elevated wedge pressure and moderately reduced cardiac output. CT of the chest showed extensive calcification of his pericardium particularly over the right atrium and atrioventricular groove part of the right ventricle. His pericardial space likely obliterated due to remote pericarditis. He was started on milrinone as well as diuresis. Evaluated by  cardiothoracic surgery for  LVAD completed 03/08/2023 per Dr. Maren Beach. He remained intubated through 03/09/2023. Hospital course is chronic Coumadin has been resumed followed by heart failure team. He did go into A-fib with RVR 9/14 IV amiodarone started and slowly weaned transition to p.o currently 200 mg twice daily. Acute blood loss anemia 9.2 and monitored. Urology was consulted 9/17 for hematuria he was placed on Proscar and hematuria has resolved his urinalysis is unremarkable persistent leukocytosis maintained on empiric Rocephin.. Creatinine remained stable 1.74-1.17. Therapies initiated with sternal precautions. Therapy evaluations completed due to patient's debility and deconditioning was admitted for a comprehensive rehab program.   Medical History:   Past Medical History:  Diagnosis Date   Acute MI, anterior wall (HCC)    AICD (automatic cardioverter/defibrillator) present    CAD (coronary artery disease)    2D ECHO, 07/13/2011 - EF <25%, LV moderatelty dilated, LA moderately dilatedLEXISCAN, 12/14/2011 - moderate-severe perfusion defect seen in the basal anteroseptal, mid anterior, apicacl anterior, apical, apical inferior, and apical lateral regions, post-stress EF 25%, new EKG changes from baseline abnormalities   Cancer [facility])    Prostate   CHF (congestive heart failure) (HCC) 2012   Hypertension 08/08/2021   Inguinal hernia, left    Pneumonia    November 2023   Pre-diabetes          Patient Active Problem List   Diagnosis Date Noted   ABLA (acute blood loss anemia) 03/22/2023   Prediabetes 03/22/2023   Acute on chronic combined systolic and diastolic heart failure (HCC) 03/22/2023   Debility 03/20/2023   Acute respiratory failure with hypoxia (HCC) 02/27/2023   Acute on chronic congestive heart failure (HCC) 02/26/2023   Demand ischemia 10/18/2022   Acute on chronic HFrEF (heart failure with reduced ejection fraction) (HCC) 10/15/2022   Acute diastolic CHF (congestive heart  failure) (HCC) 05/08/2022   Acute on chronic systolic congestive heart failure (HCC) 05/07/2022   Subdural hematoma (HCC) 08/08/2021   Myocardial infarct (HCC) 08/08/2021   Hypertension 08/08/2021   Head injury 08/08/2021   Alcohol abuse 08/08/2021   AF (atrial fibrillation) (HCC) 08/08/2021   Goals of care, counseling/discussion 06/21/2021   Malignant neoplasm of prostate (HCC) 05/17/2021   ICD (implantable cardioverter-defibrillator) battery depletion 02/24/2019   Bradycardia, drug induced    CKD stage 3a, GFR 45-59 ml/min (HCC) 11/20/2018   Chronic combined systolic and diastolic heart failure (HCC) 08/17/2015   Hyperlipidemia LDL goal <70 08/31/2014   S/P subdural hematoma evacuation 03/04/2014   Long term current use of anticoagulant therapy 08/25/2013   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 his dress was Appropriate and he was Well Groomed and his manners were Appropriate to the situation.  his participation was indicative of Appropriate and Redirectable behaviors.  There were physical disabilities noted.  he displayed an appropriate level of cooperation and motivation.    Interactions:    Active Redirectable  Attention:   abnormal and attention span appeared shorter than expected for age  Memory:   within normal limits; recent and  remote memory intact  Visuo-spatial:   not examined  Speech (Volume):  low  Speech:   normal;  deliberate  Thought Process:  Coherent and Relevant  Coherent, Organized, and Oriented  Though Content:  WNL; not suicidal and not homicidal  Orientation:   person, place, time/date, and situation  Judgment:   Fair  Planning:   Fair  Affect:    Blunted and Flat  Mood:    Dysphoric  Insight:   Fair  Intelligence:   normal  Psychiatric History:  No prior psychiatric history noted.   Impression/DX:   Steven [name] is a 67 year old male referred for neuropsychological consultation due to coping and adjustment issues after recently having LVAD placement and currently admitted onto the comprehensive inpatient rehabilitation unit.  Patient has a past medical history including ischemic cardiomyopathy/STEMI with ejection fracture of 20 to 25%, prediabetes, systolic congestive heart failure, LV thrombosis, history of ventricular tachycardia with cardiac defibrillator, hypertension, history of subdural hematoma, chronic kidney disease stage III, hyperlipidemia, prostate cancer, hypertension, and remote history of tobacco abuse.  Patient had surgery for LVAD completed on 03/08/2023 and remained intubated through 03/09/2023.  Patient continues to be followed by heart failure team.  Patient has remained stable overall medically and once therapy evaluations were completed was admitted onto the comprehensive inpatient rehabilitation unit.  The patient displayed a rather flat affect and likely a very stoic and methodical personality style premorbidly existed.  Patient denied significant or severe depressive symptomatology but did acknowledge concerned about his survival and what expectations were going forward as far as risk factors and likelihood for length of survival post vascular surgery.  I left most of those questions to be answered by his cardiology team.  The patient does acknowledge that he is feeling better as time goes on but continues to be very fatigued.  They have been working on adjusting his  LVAD pump and the patient appears to be progressing well medically.  Patient was oriented x 4 and able to describe the general nature of his medical interventions but lacked a great deal of specifics.  He is learning new information and expressive and receptive language abilities appear to be intact.  Disposition/Plan:  Today we worked on coping and adjustment issues around extended hospital stay and serious medical issues including recent LVAD surgery.           Electronically Signed   _______________________ Arley Phenix, Psy.D. Clinical Neuropsychologist

## 2023-03-23 NOTE — Progress Notes (Signed)
Patient ID: Steven [name], male   DOB: [birth year], 67 y.o.   MRN: [id number] Met with the patient to review current situation, rehab process, team conference and plan of care. Patient expressed fatigue and not being able to sleep at night when awakened for vitals a MN/4a. Reviewed protocol for LVAD assessment. Patient expressed concern for "needing help" after two weeks. Caregiver Suzette Battiest) only plans to stay ~2 weeks. Patient is thinking about questions he may have related to discharge home with an LVAD and how he will manage, meals, B+D, etc. Reports he has met with a peer but feels the battery change over is complicated and batteries are "heavy". Declined a return visit for a peer visit. Reviewed medications and dietary modifications for HF and prediabetes. Continue to follow along to address educational needs to facilitate preparation for discharge. Pamelia Hoit

## 2023-03-23 NOTE — Progress Notes (Addendum)
Advanced Heart Failure VAD Team Note  PCP-Cardiologist: Nicki Guadalajara, MD  AHF: Dr. Gala Romney   Subjective:    9/12: s/p HM3 LVAD 9/24: discharged to CIR  9/25: Innumerable PIs on VAD. Suspected to be dry. Stopped spiro and losartan. Given LR bolus 500 cc  INR 2.1.   Denies SOB. Able to walk to the gym.   LVAD INTERROGATION:  HeartMate III LVAD:   Flow 4.3 liters/min, speed 5500, power 4, PI 3.1.   ~ 40 PI  Objective:    Vital Signs:   Temp:  [98 F (36.7 C)-98.8 F (37.1 C)] 98.4 F (36.9 C) (09/27 0400) Pulse Rate:  [69-85] 69 (09/27 0757) Resp:  [14-19] 14 (09/27 0757) BP: (85-112)/(71-90) 96/79 (09/27 0757) SpO2:  [100 %] 100 % (09/27 0757) Last BM Date : 03/21/23 Mean arterial Pressure MAP 80s  Intake/Output:   Intake/Output Summary (Last 24 hours) at 03/23/2023 0903 Last data filed at 03/23/2023 0640 Gross per 24 hour  Intake 238 ml  Output 2850 ml  Net -2612 ml     Physical Exam   Physical Exam: GENERAL: No acute distress. Sitting in the chair.  HEENT: normal  NECK: Supple, JVP flat   .  2+ bilaterally, no bruits.  No lymphadenopathy or thyromegaly appreciated.   CARDIAC:  Mechanical heart sounds with LVAD hum present.  LUNGS:  Clear to auscultation bilaterally.  ABDOMEN:  Soft, round, nontender, positive bowel sounds x4.     LVAD exit site: .  Dressing dry and intact.  No erythema or drainage.  Stabilization device present and accurately applied.   EXTREMITIES:  Warm and dry, no cyanosis, clubbing, rash or edema  NEUROLOGIC:  Alert and oriented x 3.    No aphasia.  No dysarthria.  Affect flat.    EKG    N/A   Labs   Basic Metabolic Panel: Recent Labs  Lab 03/17/23 0510 03/18/23 0435 03/19/23 0530 03/20/23 0530 03/21/23 0448 03/23/23 0339  NA 132* 130* 131* 128* 131*  --   K 4.0 3.7 4.0 4.0 4.2  --   CL 98 93* 98 98 99  --   CO2 25 25 25  21* 21*  --   GLUCOSE 103* 100* 99 109* 80  --   BUN 25* 22 19 20 19   --   CREATININE 1.44* 1.33*  1.17 1.12 1.11  --   CALCIUM 8.6* 8.6* 8.2* 7.9* 8.5*  --   MG  --   --  2.1 2.0  --  2.0    Liver Function Tests: Recent Labs  Lab 03/17/23 0510 03/21/23 0448  AST 139* 112*  ALT 103* 131*  ALKPHOS 70 82  BILITOT 1.4* 1.1  PROT 6.3* 6.2*  ALBUMIN 2.7* 2.5*   No results for input(s): "LIPASE", "AMYLASE" in the last 168 hours. No results for input(s): "AMMONIA" in the last 168 hours.  CBC: Recent Labs  Lab 03/18/23 0435 03/19/23 0530 03/20/23 0548 03/21/23 0448 03/23/23 0339  WBC 17.4* 17.5* 18.0* 15.4* 12.9*  NEUTROABS  --   --   --  13.0*  --   HGB 9.3* 9.2* 9.2* 9.2* 9.0*  HCT 28.8* 28.9* 28.5* 29.0* 28.0*  MCV 95.7 94.1 94.1 93.9 94.9  PLT 426* 439* 464* 528* 503*    INR: Recent Labs  Lab 03/19/23 0530 03/20/23 0530 03/21/23 0448 03/22/23 0323 03/23/23 0339  INR 2.3* 2.6* 2.5* 2.4* 2.1*    Other results: EKG:    Imaging   No results found.  Medications:     Scheduled Medications:  amiodarone  200 mg Oral BID   aspirin EC  81 mg Oral Daily   atorvastatin  80 mg Oral Daily   bisacodyl  10 mg Oral Daily   Or   bisacodyl  10 mg Rectal Daily   Chlorhexidine Gluconate Cloth  6 each Topical BID   docusate sodium  200 mg Oral Daily   feeding supplement  237 mL Oral Q24H   finasteride  5 mg Oral Daily   insulin aspart  0-24 Units Subcutaneous TID WC   multivitamin with minerals  1 tablet Oral Daily   pantoprazole  40 mg Oral Daily   polyethylene glycol  17 g Oral Daily   sodium chloride flush  10-40 mL Intracatheter Q12H   tamsulosin  0.4 mg Oral QPC supper   warfarin  2.5 mg Oral ONCE-1600   Warfarin - Pharmacist Dosing Inpatient   Does not apply q1600    Infusions:  sodium chloride Stopped (03/20/23 2025)    PRN Medications: sodium chloride, guaiFENesin, oxyCODONE, phenylephrine, traMADol, traZODone   Patient Profile   67 y/o male w/ chronic systolic heart failure due to ICM 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 Now s/p  HM3 LVAD 9/12. Discharged to CIR on 9/24    Assessment/Plan:    1. Acute on chronic combined systolic and diastolic heart failure>>Low Output S/p HM3 LVAD on 9/12.  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 NYHA IV on admission.  Echo 02/27/23: EF <20%, LV with GHK, RV mildly reduced, GIIDD, LA mod dilated, mild MR.  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 GDMT limited by renal function and hypotension.  S/P HM3 LVAD implant 9/12.  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 Intra-op TEE LVEF 15%, RV normal.  - Ramp ECHO 03/13/23. Speed increased to 5500. Pain controlled with tylenol.  - 9/25 Innumerable PIs on VAD, GDMT held and given LR bolus.  - MAP 80s. Volume status ok. Off diuretics - Off GDMT w/ soft BPs  - LDH stable - INR 2.1  Discussed dosing with PharmD personally. - Continue 81 mg ASA . Stop 04/07/23   2. 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  -  No chest pain.  - on ASA/statin   3. AKI on CKD stage 3: Creatinine baseline 1.6-1.8 - cardiorenal, resolved w/ LVAD support - avoid hypotension. Holding GDMT per above  - Check BMET   4. Mural thrombus: on Warfarin.  - INR 2  5. 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 Anemia: Post-op. S/p 1UPRBCs.  - Hgb stable 9  7. Atrial fibrillation:  - continue amio 200 mg twice a day   - On coumadin. INR 2.1  8. Hematuria:  - Urology consulted. Started on proscar.  - Suspect related to trauma with foley insertion.  - resolved, Hgb stable. Continue to follow.   9. UTI  - Completed  antibiotic course.   - WBCs  down trending.   10. Deconditioning:  - CIR. Appreciate their assistance    I reviewed the LVAD parameters from today, and compared the results to the patient's prior recorded data.  No programming changes were made.  The LVAD is functioning within specified parameters.  The patient  performs LVAD self-test daily.  LVAD interrogation was negative for any significant power changes, alarms or PI events/speed drops.  LVAD  equipment check completed and is in good working order.  Back-up equipment present.   LVAD education done on emergency procedures and precautions and reviewed exit site care.  Length of Stay: 3  Tonye Becket, NP 03/23/2023, 9:03 AM  VAD Team --- VAD ISSUES ONLY--- Pager [phone] (7am - 7am)  Advanced Heart Failure Team  Pager [phone] (M-F; 7a - 5p)  Please contact [facility] for night-coverage after hours (5p -7a ) and weekends on [email or web address]  Patient seen and examined with the above-signed Advanced Practice Provider and/or Housestaff. I personally reviewed laboratory data, imaging studies and relevant notes. I independently examined the patient and formulated the important aspects of the plan. I have edited the note to reflect any of my changes or salient points. I have personally discussed the plan with the patient and/or family.  Feels ok. Continues to work with rehab. Still with frequent PI events but MAP and volume status ok.   INR 2.1  General:  NAD.  HEENT: normal  Neck: supple. JVP not elevated.  Carotids 2+ bilat; no bruits. No lymphadenopathy or thryomegaly appreciated. Cor: LVAD hum.  Lungs: Clear. Abdomen: soft, nontender, non-distended. No hepatosplenomegaly. No bruits or masses. Good bowel sounds. Driveline site clean. Anchor in place.  Extremities: no cyanosis, clubbing, rash. Warm tr  edema  Neuro: alert & oriented x 3. No focal deficits. Moves all 4 without problem   Volume status and MAPs ok.   VAD interrogated personally. Parameters stable.  INR 2.1. Discussed dosing with PharmD personally.  Continue rehab.   Arvilla Meres, MD  10:21 AM

## 2023-03-23 NOTE — Progress Notes (Signed)
Patient ID: Steven [name], male   DOB: 1956-05-30, 67 y.o.   MRN: [id number]  Team recommends 3 in 1 and tub bench for when he can bath. No preference have made referral to [employer] for both of these pieces of equipment. Should be delivered prior to discharge 10/1

## 2023-03-23 NOTE — Progress Notes (Addendum)
LVAD Coordinator Rounding Note:  Admitted 02/26/23 due to CHF.   HM3 LVAD implanted on 03/08/23 by Dr Laneta Simmers under DT criteria.  9/12: HM3 LVAD Implant. Intra-op TEE LVEF 15%, RV normal  9/13: Extubated.  Driveline pulled back, 1 inch of Velour exposed.  9/14: Went into atrial fibrillation with RVR,  IV amiodarone started.  9/15: Hematuria.  9/17: Urology consulted for hematuria. Proscar added. Given 1 unit of blood. Ramp Echo speed increased to 5500. 03/20/23: Discharged to CIR  Pt sitting up in recliner. Denies complaints. Still with productive cough. Encouraged to use IS hourly. He verbalized understanding.   All chest tube sutures removed today. Cleansed suture sites with betadine prior to removal, and again following removal. Incisions well approximated.   VAD home equipment has arrived. Discharge education completed with pt on 03/19/23.  Due to weather today Suzette Battiest unable to come to the hospital to continue teaching. VAD coordinator will call her Monday morning to schedule time for further teaching. Discharge tentatively planned for 10/1. Veronica plans to stay with pt at his home for 2 weeks.   Vital signs: Temp: 98.4 HR: 80 paced Doppler Pressure: 82 Auto BP: 96/79 (84) O2 Sat: 100% on RA Wt: 155.6>169.5>162>153.4>159.3>153.4>151.9>148.8>149.7>150.1>150.3 lbs    LVAD interrogation reveals:  Speed: 5400 Flow: 4.5 Power: 4.0 w  PI: 3.0  Alarms: none  Events:  36 PI events so far today Hematocrit: 29  Fixed speed: 5500 Low speed limit: 5200   Drive Line:  Dressing clean, dry, and intact. Anchor correctly applied. Continue twice weekly dressing changes on Monday & Thursday. Next dressing change due 03/26/23 by nurse champion or VAD coordinator.    Labs:  LDH trend: 234>310>274>302>318>306>285>310>386>270>202  INR trend: 1.6>1.5>1.6>1.5>1.7>2.3>2.6>2.3>2.5>2.4>1.8  Anticoagulation Plan: -INR Goal: 2-2.5 -ASA Dose: 81 mg   Blood Products:  -Intra op: 03/08/23 1  PRBC 2 FFP 1 PLT 1 Cryo 450 cell saver  Post op: 03/08/23 1 PRBC 2 FFP 1 PLT 1 Cryo  03/09/23: 1 PRBC 03/13/23: 1 PRBC  Device: -Medtronic -Therapies: ON  -VF > 200BMP  Arrythmias: hx afib- currently paced 80  Respiratory: on RA  Infection:   Renal:  -CRT: 1.28>1.72>1.76>1.63>1.37>1.17>1.12>1.11  Patient Education: All discharge education completed at bedside 03/19/23 with pt. Please see separate note for details.   Adverse Events on VAD:  Plan/Recommendations:  1. Please page VAD coordinator with any equipment issues or driveline problems. 2. Continue twice weekly dressing changes on Monday and Thursday using silver strip by VAD coordinator or nurse champion. Next dressing due 03/26/23.  Alyce Pagan RN VAD Coordinator  Office: [phone]  24/7 Pager: [phone]

## 2023-03-24 LAB — CBC
HCT: 25.9 % — ABNORMAL LOW (ref 39.0–52.0)
Hemoglobin: 8.2 g/dL — ABNORMAL LOW (ref 13.0–17.0)
MCH: 29.6 pg (ref 26.0–34.0)
MCHC: 31.7 g/dL (ref 30.0–36.0)
MCV: 93.5 fL (ref 80.0–100.0)
Platelets: 451 10*3/uL — ABNORMAL HIGH (ref 150–400)
RBC: 2.77 MIL/uL — ABNORMAL LOW (ref 4.22–5.81)
RDW: 14.6 % (ref 11.5–15.5)
WBC: 9.2 10*3/uL (ref 4.0–10.5)
nRBC: 0 % (ref 0.0–0.2)

## 2023-03-24 LAB — PROTIME-INR
INR: 2 — ABNORMAL HIGH (ref 0.8–1.2)
Prothrombin Time: 22.6 s — ABNORMAL HIGH (ref 11.4–15.2)

## 2023-03-24 LAB — BASIC METABOLIC PANEL
Anion gap: 10 (ref 5–15)
BUN: 21 mg/dL (ref 8–23)
CO2: 22 mmol/L (ref 22–32)
Calcium: 8.4 mg/dL — ABNORMAL LOW (ref 8.9–10.3)
Chloride: 98 mmol/L (ref 98–111)
Creatinine, Ser: 1.06 mg/dL (ref 0.61–1.24)
GFR, Estimated: >60 mL/min (ref >60)
Glucose, Bld: 95 mg/dL (ref 70–99)
Potassium: 4.3 mmol/L (ref 3.5–5.1)
Sodium: 130 mmol/L — ABNORMAL LOW (ref 135–145)

## 2023-03-24 LAB — GLUCOSE, CAPILLARY
Glucose-Capillary: 106 mg/dL — ABNORMAL HIGH (ref 70–99)
Glucose-Capillary: 93 mg/dL (ref 70–99)
Glucose-Capillary: 144 mg/dL — ABNORMAL HIGH (ref 70–99)
Glucose-Capillary: 138 mg/dL — ABNORMAL HIGH (ref 70–99)
Glucose-Capillary: 113 mg/dL — ABNORMAL HIGH (ref 70–99)

## 2023-03-24 LAB — MAGNESIUM: Magnesium: 1.9 mg/dL (ref 1.7–2.4)

## 2023-03-24 LAB — LACTATE DEHYDROGENASE: LDH: 264 U/L — ABNORMAL HIGH (ref 98–192)

## 2023-03-24 MED ORDER — WARFARIN SODIUM 2 MG PO TABS
2.0000 mg | ORAL_TABLET | Freq: Once | ORAL | Status: AC
  Administered 2023-03-24: 2 mg via ORAL
  Filled 2023-03-24: qty 1

## 2023-03-24 NOTE — Progress Notes (Signed)
Physical Therapy Session Note  Patient Details  Name: Steven [name] MRN: [id number] Date of Birth: 01-30-1956  Today's Date: 03/24/2023 PT Individual Time: 1022-1050 PT Individual Time Calculation (min): 28 min   Short Term Goals: Week 1:  PT Short Term Goal 1 (Week 1): = to LTGs based on ELOS  Skilled Therapeutic Interventions/Progress Updates:    Pt received standing in room with nurse present and pt agreeable to therapy session. RN reports pt's MAP is around 80 and he is cleared to participate in therapy. Pt denies any symptoms throughout session. Pt reports need to use bathroom and is already transitioned to LVAD batteries, off wall; however, pt does require assistance to properly don holster for batteries.  CGA/close supervision, no AD, ambulating from recliner to toilet. Pt continent of bowel, bloody bright red stool. RN notified and present to assess. Supervision for standing balance during pericare hygiene. Use of grab bars to perform STS from commode. Standing hand hygiene at sink with close supervision for balance safety.  Dynamic balance: ambulating from commode to edge of bed. No AD, close supervision. Pt ambulated backwards 5 ft to bed prior to sitting down with no balance instability noted.  Verbal cues to remind pt to bring extra batteries,clips, and monitor for LVAD. Pt educated on remembering to always have backup equipment when he leaves the house after d/c. Education performed w/ pt standing, close supervision.  Gait training ~343ft, no AD, with SBA for safety - no balance instability noted, min decreased gait speed, reciprocal pattern - performed 2x head turns horizontally and vertically with no noticeable postural sway.  Pt able to unpack his back-up bag and place batteries back on charge with increased time and min cuing on how to open back-up bag.  Pt able to transition back to wall power with 1x min cuing as pt started to take the battery out of the clip prior to moving  his power line to the wall cord.  Pt left in recliner, alarm activated, lines intact, all needs met.  Therapy Documentation Precautions:  Precautions Precautions: Sternal, Other (comment) Restrictions Weight Bearing Restrictions: Yes RUE Weight Bearing: Non weight bearing LUE Weight Bearing: Non weight bearing Other Position/Activity Restrictions: sternal precautions only   Pain:  No reports of pain throughout session.    Therapy/Group: Individual Therapy  Ginny Forth , PT, DPT, NCS, CSRS 03/24/2023, 7:57 AM

## 2023-03-24 NOTE — Progress Notes (Signed)
PROGRESS NOTE   Subjective/Complaints:  Appreciate cardiology note.  Reviewed labs from today.  Hemoglobin is reduced as his WBC, PLT remain mildly elevated, mild hyponatremia noted Review of systems: Denies chest pain at rest, has chest wall soreness when he coughs.  Baseline shortness of breath mostly with activity.   Denies abdominal pain, nausea, vomiting, headache. + Leg swelling-improved  Objective:   No results found. Recent Labs    03/23/23 0339 03/24/23 0402  WBC 12.9* 9.2  HGB 9.0* 8.2*  HCT 28.0* 25.9*  PLT 503* 451*   Recent Labs    03/24/23 0402  NA 130*  K 4.3  CL 98  CO2 22  GLUCOSE 95  BUN 21  CREATININE 1.06  CALCIUM 8.4*    Intake/Output Summary (Last 24 hours) at 03/24/2023 1416 Last data filed at 03/24/2023 0831 Gross per 24 hour  Intake 1300 ml  Output 900 ml  Net 400 ml        Physical Exam: Vital Signs Blood pressure (!) 81/70, pulse 82, temperature 98.9 F (37.2 C), resp. rate 18, height 5\' 9"  (1.753 m), weight 70.8 kg, SpO2 100%.  General: No acute distress Mood and affect are appropriate Heart: Regular rate and rhythm no rubs murmurs or extra sounds Lungs: Clear to auscultation, breathing unlabored, no rales or wheezes Abdomen: Positive bowel sounds, soft nontender to palpation, nondistended Extremities: No clubbing, cyanosis, or edema Skin: No evidence of breakdown, no evidence of rash  Neuro:   Patient is alert sitting in chair working with OT.  Follows commands.  Oriented x 3. Normal language, speech. Functional memory and awareness. UE motor 4/5. LE motor 3+/5 HF, KE 4/5, ADF/PF 4+/5. No sensory deficits appreciated. Normal muscle tone.  Cranial nerves II through XII grossly intact. Musculoskeletal: No joint swelling noted   Assessment/Plan: 1. Functional deficits which require 3+ hours per day of interdisciplinary therapy in a comprehensive inpatient rehab  setting. Physiatrist is providing close team supervision and 24 hour management of active medical problems listed below. Physiatrist and rehab team continue to assess barriers to discharge/monitor patient progress toward functional and medical goals  Care Tool:  Bathing    Body parts bathed by patient: Right arm, Left arm, Chest, Abdomen, Front perineal area, Right upper leg, Left upper leg, Face   Body parts bathed by helper: Right lower leg, Left lower leg     Bathing assist Assist Level: Minimal Assistance - Patient > 75%     Upper Body Dressing/Undressing Upper body dressing   What is the patient wearing?: Pull over shirt    Upper body assist Assist Level: Contact Guard/Touching assist    Lower Body Dressing/Undressing Lower body dressing      What is the patient wearing?: Pants, Incontinence brief     Lower body assist Assist for lower body dressing: Moderate Assistance - Patient 50 - 74%     Toileting Toileting    Toileting assist Assist for toileting: Minimal Assistance - Patient > 75%     Transfers Chair/bed transfer  Transfers assist     Chair/bed transfer assist level: Supervision/Verbal cueing     Locomotion Ambulation   Ambulation assist  Assist level: Supervision/Verbal cueing Assistive device: No Device Max distance: 373ft   Walk 10 feet activity   Assist     Assist level: Supervision/Verbal cueing Assistive device: No Device   Walk 50 feet activity   Assist    Assist level: Supervision/Verbal cueing Assistive device: No Device    Walk 150 feet activity   Assist    Assist level: Supervision/Verbal cueing Assistive device: No Device    Walk 10 feet on uneven surface  activity   Assist Walk 10 feet on uneven surfaces activity did not occur: Safety/medical concerns         Wheelchair     Assist Is the patient using a wheelchair?: No             Wheelchair 50 feet with 2 turns  activity    Assist            Wheelchair 150 feet activity     Assist          Blood pressure (!) 81/70, pulse 82, temperature 98.9 F (37.2 C), resp. rate 18, height 5\' 9"  (1.753 m), weight 70.8 kg, SpO2 100%.  Medical Problem List and Plan: 1. Functional deficits secondary to debility after heart failure/ LVAD implant 9/12.   -Sternal precautions             -patient may not yet shower             -ELOS/Goals: 7-10 days, mod I goals with PT/OT  -Continue CIR  -Estimated discharge 10/1  -Continue education regarding LVAD  -IPOC note completed 2.  Antithrombotics: -DVT/anticoagulation:  Pharmaceutical: Coumadin             -antiplatelet therapy: Aspirin 81 mg daily 3. Pain Management: Oxycodone/tramadol as needed 4. Mood/Behavior/Sleep: N/A             -antipsychotic agents: N/A 5. Neuropsych/cognition: This patient is capable of making decisions on his own behalf.  -Seen by neuropsychology for adjustment disorder 6. Skin/Wound Care: Routine skin checks             -local care to drive site  7. Fluids/Electrolytes/Nutrition: encourage PO             -Hyponatremia, 9/25 stable at 131 continue to monitor  -BMP every 24 hours, sodium 131 9/28 8.  Acute on chronic combined systolic and diastolic congestive heart failure.  Follow-up per heart failure team.  Spironolactone and losartan number discontinued by heart failure team             -daily weights  -9/27 weight stable, lower extremity edema appears improved, heart failure team following appreciate assistance    Filed Weights   03/22/23 0520 03/23/23 1056 03/24/23 0500  Weight: 68.2 kg 68.1 kg 70.8 kg    9.  Acute blood loss anemia.  Follow-up CBC  -9/27 hemoglobin stable at 9.8 10.  Hematuria/UTI/persistent leukocytosis.  Placed on Proscar per urology services.  Complete empiric course of Rocephin.  -9/25 WBC down to 15.4. 11.  Hyperlipidemia.  Lipitor 12.  Prediabetes.  Hemoglobin A1c 6.6.  SSI  -9/26  CBGs well-controlled, continue to monitor for now 13.  CKD stage III a?  Follow-up chemistries             -9/25 creatinine stable at 1.11.    Latest Ref Rng & Units 03/24/2023    4:02 AM 03/21/2023    4:48 AM 03/20/2023    5:30 AM  BMP  Glucose 70 - 99 mg/dL  95  80  109   BUN 8 - 23 mg/dL 21  19  20    Creatinine 0.61 - 1.24 mg/dL 8.11  9.14  7.82   Sodium 135 - 145 mmol/L 130  131  128   Potassium 3.5 - 5.1 mmol/L 4.3  4.2  4.0   Chloride 98 - 111 mmol/L 98  99  98   CO2 22 - 32 mmol/L 22  21  21    Calcium 8.9 - 10.3 mg/dL 8.4  8.5  7.9     14.  Atrial fibrillation.  Amiodarone 200 mg twice daily.  Continue Coumadin  -9/26 INR 2.4  9/27 heart rate stable, INR 2.1     03/24/2023   12:15 PM 03/24/2023   12:05 PM 03/24/2023    8:10 AM  Vitals with BMI  Systolic  81   Diastolic  70   Pulse 82 83 82    15.  Transaminitis.   -Suspect cardiac related.  Stable from prior.  Avoid hepatotoxic medications if possible.   LOS: 4 days A FACE TO FACE EVALUATION WAS PERFORMED  Erick Colace 03/24/2023, 2:16 PM

## 2023-03-24 NOTE — Progress Notes (Signed)
Advanced Heart Failure VAD Team Note  PCP-Cardiologist: Nicki Guadalajara, MD  AHF: Dr. Gala Romney   Subjective:    9/12: s/p HM3 LVAD 9/24: discharged to CIR  9/25: Innumerable PIs on VAD. Suspected to be dry. Stopped spiro and losartan. Given LR bolus 500 cc  INR 2   Doing very well; no SOB; feels that he is getting stronger.   LVAD INTERROGATION:  HeartMate III LVAD:   Flow 4.1 liters/min, speed 5500, power 4, PI 3.9.   >30PI events  Objective:    Vital Signs:   Temp:  [97.6 F (36.4 C)-98.9 F (37.2 C)] 98.9 F (37.2 C) (09/28 1205) Pulse Rate:  [79-83] 82 (09/28 1215) Resp:  [14-19] 18 (09/28 1215) BP: (81-106)/(70-94) 81/70 (09/28 1205) SpO2:  [99 %-100 %] 100 % (09/28 1205) Weight:  [70.8 kg] 70.8 kg (09/28 0500) Last BM Date : 03/21/23 Mean arterial Pressure MAP 80s  Intake/Output:   Intake/Output Summary (Last 24 hours) at 03/24/2023 1413 Last data filed at 03/24/2023 0831 Gross per 24 hour  Intake 1300 ml  Output 900 ml  Net 400 ml     Physical Exam   Physical Exam: GENERAL: No acute distress. Sitting in the chair.  HEENT: normal  NECK: Supple, JVP 8   .  2+ bilaterally, no bruits.  No lymphadenopathy or thyromegaly appreciated.   CARDIAC:  Mechanical heart sounds with LVAD hum present.  LUNGS:  Clear to auscultation bilaterally.  ABDOMEN:  Soft, round, nontender, positive bowel sounds x4.     LVAD exit site: .  Dressing dry and intact.  No erythema or drainage.  Stabilization device present and accurately applied.   EXTREMITIES:  Warm and dry, no cyanosis, clubbing, rash or edema  NEUROLOGIC:  Alert and oriented x 3.    No aphasia.  No dysarthria.  Affect flat.    EKG    N/A   Labs   Basic Metabolic Panel: Recent Labs  Lab 03/18/23 0435 03/19/23 0530 03/20/23 0530 03/21/23 0448 03/23/23 0339 03/24/23 0402  NA 130* 131* 128* 131*  --  130*  K 3.7 4.0 4.0 4.2  --  4.3  CL 93* 98 98 99  --  98  CO2 25 25 21* 21*  --  22  GLUCOSE 100* 99  109* 80  --  95  BUN 22 19 20 19   --  21  CREATININE 1.33* 1.17 1.12 1.11  --  1.06  CALCIUM 8.6* 8.2* 7.9* 8.5*  --  8.4*  MG  --  2.1 2.0  --  2.0 1.9    Liver Function Tests: Recent Labs  Lab 03/21/23 0448  AST 112*  ALT 131*  ALKPHOS 82  BILITOT 1.1  PROT 6.2*  ALBUMIN 2.5*   No results for input(s): "LIPASE", "AMYLASE" in the last 168 hours. No results for input(s): "AMMONIA" in the last 168 hours.  CBC: Recent Labs  Lab 03/19/23 0530 03/20/23 0548 03/21/23 0448 03/23/23 0339 03/24/23 0402  WBC 17.5* 18.0* 15.4* 12.9* 9.2  NEUTROABS  --   --  13.0*  --   --   HGB 9.2* 9.2* 9.2* 9.0* 8.2*  HCT 28.9* 28.5* 29.0* 28.0* 25.9*  MCV 94.1 94.1 93.9 94.9 93.5  PLT 439* 464* 528* 503* 451*    INR: Recent Labs  Lab 03/20/23 0530 03/21/23 0448 03/22/23 0323 03/23/23 0339 03/24/23 0402  INR 2.6* 2.5* 2.4* 2.1* 2.0*    Other results: EKG:    Imaging   No results found.  Medications:     Scheduled Medications:  amiodarone  200 mg Oral BID   aspirin EC  81 mg Oral Daily   atorvastatin  80 mg Oral Daily   bisacodyl  10 mg Oral Daily   Or   bisacodyl  10 mg Rectal Daily   Chlorhexidine Gluconate Cloth  6 each Topical BID   chlorpheniramine-HYDROcodone  5 mL Oral Q12H   docusate sodium  200 mg Oral Daily   feeding supplement  237 mL Oral Q24H   finasteride  5 mg Oral Daily   insulin aspart  0-24 Units Subcutaneous TID WC   multivitamin with minerals  1 tablet Oral Daily   pantoprazole  40 mg Oral Daily   polyethylene glycol  17 g Oral Daily   sodium chloride flush  10-40 mL Intracatheter Q12H   tamsulosin  0.4 mg Oral QPC supper   warfarin  2 mg Oral ONCE-1600   Warfarin - Pharmacist Dosing Inpatient   Does not apply q1600    Infusions:  sodium chloride Stopped (03/20/23 2025)    PRN Medications: sodium chloride, oxyCODONE, phenylephrine, traMADol, traZODone   Patient Profile   67 y/o male w/ chronic systolic heart failure due to ICM w/  progression to end-stage/inotrope dependent. Now s/p HM3 LVAD 9/12. Discharged to CIR on 9/24    Assessment/Plan:    1. Acute on chronic combined systolic and diastolic heart failure>>Low Output S/p HM3 LVAD on 9/12.  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 NYHA IV on admission.  Echo 02/27/23: EF <20%, LV with GHK, RV mildly reduced, GIIDD, LA mod dilated, mild MR.  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 GDMT limited by renal function and hypotension.  S/P HM3 LVAD implant 9/12.  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 Intra-op TEE LVEF 15%, RV normal.  - Ramp ECHO 03/13/23. Speed increased to 5500. Pain controlled with tylenol.  - MAP 80s. Euvolemic on exam - Off GDMT w/ soft BPs  - LDH stable - INR 2  Discussed dosing with PharmD personally. - Continue 81 mg ASA . Stop 04/07/23   2. 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  -  No chest pain.  - on ASA/statin   3. AKI on CKD stage 3: Creatinine baseline 1.6-1.8 - cardiorenal, resolved w/ LVAD support - avoid hypotension. Holding GDMT per above  - sCr stable.   4. Mural thrombus: on Warfarin.  - INR 2  5. 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 Anemia: Post-op. S/p 1UPRBCs.  - Hgb stable 9  7. Atrial fibrillation:  - continue amio 200 mg twice a day   - On coumadin. INR 2  8. Hematuria:  - Urology consulted. Started on proscar.  - Suspect related to trauma with foley insertion.  - resolved, Hgb stable. Continue to follow.   9. UTI  - Completed  antibiotic course.   - WBCs  down trending.   10. Deconditioning:  - CIR. Appreciate their assistance    I reviewed the LVAD parameters from today, and compared the results to the patient's prior recorded data.  No programming changes were made.  The LVAD is functioning within specified parameters.  The patient performs LVAD self-test daily.   LVAD interrogation was negative for any significant power changes, alarms or PI events/speed drops.  LVAD equipment check completed and is in good working  order.  Back-up equipment present.   LVAD education done on emergency procedures and precautions and reviewed exit site care.  Length of Stay: 4  Steven Pherigo, DO 03/24/2023, 2:13 PM  VAD Team --- VAD ISSUES ONLY--- Pager [phone] (7am - 7am)  Advanced Heart Failure Team  Pager [phone] (M-F; 7a - 5p)  Please contact [facility] for night-coverage after hours (5p -7a ) and weekends on [email or web address]

## 2023-03-24 NOTE — Progress Notes (Signed)
ANTICOAGULATION CONSULT NOTE - Follow-up  Pharmacy Consult for warfarin management Indication:  LVAD  No Known Allergies  Patient Measurements: Height: 5\' 9"  (175.3 cm) Weight: 70.8 kg (156 lb 1.4 oz) IBW/kg (Calculated) : 70.7 Heparin Dosing Weight: 73.5 kg  Vital Signs: Temp: 98.1 F (36.7 C) (09/28 0806) Temp Source: Oral (09/28 0806) BP: 88/75 (09/28 0806) Pulse Rate: 82 (09/28 0810)  Labs: Recent Labs    03/22/23 0323 03/23/23 0339 03/24/23 0402  HGB  --  9.0* 8.2*  HCT  --  28.0* 25.9*  PLT  --  503* 451*  LABPROT 26.3* 23.7* 22.6*  INR 2.4* 2.1* 2.0*  CREATININE  --   --  1.06    Estimated Creatinine Clearance: 66.7 mL/min (by C-G formula based on SCr of 1.06 mg/dL).   Medical History: Past Medical History:  Diagnosis Date   Acute MI, anterior wall (HCC)    AICD (automatic cardioverter/defibrillator) present    CAD (coronary artery disease)    2D ECHO, 07/13/2011 - EF <25%, LV moderatelty dilated, LA moderately dilatedLEXISCAN, 12/14/2011 - moderate-severe perfusion defect seen in the basal anteroseptal, mid anterior, apicacl anterior, apical, apical inferior, and apical lateral regions, post-stress EF 25%, new EKG changes from baseline abnormalities   Cancer (HCC)    Prostate   CHF (congestive heart failure) (HCC) 2012   Hypertension 08/08/2021   Inguinal hernia, left    Pneumonia    November 2023   Pre-diabetes     Medications:  Medications Prior to Admission  Medication Sig Dispense Refill Last Dose   acetaminophen (TYLENOL) 500 MG tablet Take 1,000 mg by mouth every 6 (six) hours as needed for mild pain.      amiodarone (PACERONE) 200 MG tablet Take 1 tablet (200 mg total) by mouth 2 (two) times daily.      aspirin EC 81 MG tablet Take 1 tablet (81 mg total) by mouth daily. Swallow whole.      atorvastatin (LIPITOR) 80 MG tablet Take 1 tablet (80 mg total) by mouth daily. 30 tablet 3    bisacodyl (DULCOLAX) 5 MG EC tablet Take 2 tablets (10 mg  total) by mouth daily.      [EXPIRED] cefTRIAXone 2 g in sodium chloride 0.9 % 100 mL Inject 2 g into the vein daily for 1 day.      Cholecalciferol (VITAMIN D) 125 MCG (5000 UT) CAPS Take 5,000 Units by mouth daily.      docusate sodium (COLACE) 100 MG capsule Take 2 capsules (200 mg total) by mouth daily.      finasteride (PROSCAR) 5 MG tablet Take 1 tablet (5 mg total) by mouth daily.      insulin aspart (NOVOLOG) 100 UNIT/ML injection Inject 0-24 Units into the skin 3 (three) times daily with meals.      losartan (COZAAR) 25 MG tablet Take 1 tablet (25 mg total) by mouth daily.      Multiple Vitamin (MULTIVITAMIN) tablet Take 1 tablet by mouth daily.      pantoprazole (PROTONIX) 40 MG tablet Take 1 tablet (40 mg total) by mouth daily.      spironolactone (ALDACTONE) 25 MG tablet Take 0.5 tablets (12.5 mg total) by mouth daily.      tamsulosin (FLOMAX) 0.4 MG CAPS capsule Take 1 capsule (0.4 mg total) by mouth daily after supper.      torsemide (DEMADEX) 20 MG tablet Take 1 tablet (20 mg total) by mouth daily as needed. Take 1 tablet by mouth every other  day.      traMADol (ULTRAM) 50 MG tablet Take 1-2 tablets (50-100 mg total) by mouth every 4 (four) hours as needed for moderate pain.      traZODone (DESYREL) 50 MG tablet Take 1 tablet (50 mg total) by mouth at bedtime as needed for sleep.      warfarin (COUMADIN) 1 MG tablet Take 1 tablet (1 mg total) by mouth one time only at 4 PM.      Scheduled:   amiodarone  200 mg Oral BID   aspirin EC  81 mg Oral Daily   atorvastatin  80 mg Oral Daily   bisacodyl  10 mg Oral Daily   Or   bisacodyl  10 mg Rectal Daily   Chlorhexidine Gluconate Cloth  6 each Topical BID   chlorpheniramine-HYDROcodone  5 mL Oral Q12H   docusate sodium  200 mg Oral Daily   feeding supplement  237 mL Oral Q24H   finasteride  5 mg Oral Daily   insulin aspart  0-24 Units Subcutaneous TID WC   multivitamin with minerals  1 tablet Oral Daily   pantoprazole  40 mg  Oral Daily   polyethylene glycol  17 g Oral Daily   sodium chloride flush  10-40 mL Intracatheter Q12H   tamsulosin  0.4 mg Oral QPC supper   Warfarin - Pharmacist Dosing Inpatient   Does not apply q1600   Infusions:   sodium chloride Stopped (03/20/23 2025)   PRN: sodium chloride, oxyCODONE, phenylephrine, traMADol, traZODone  Assessment: Pt presented [facility] ED on 02/26/2023 with shortness of breath and SpO2 of 78 on room air. Pt has PMH of ICM (LAD infarct), HFrEF (EF <20%), LVT, VT s/p ICD, HTN, CKD3, HLD Pt underwent HMIII implantation on 9/12. Warfarin was initiated 9/15.   INR today is therapeutic at 2. (-0.1), patient is eating well. CBC and LDH stable.  Goal of Therapy:  2.0-2.5 Monitor platelets by anticoagulation protocol: Yes   Plan:  Warfarin 2 mg PO x1 Daily INR, CBC, LDH  Thank you Okey Regal, PharmD 03/24/2023

## 2023-03-24 NOTE — Progress Notes (Signed)
Patient's stool this morning contained a moderate amount of frank blood.

## 2023-03-25 DIAGNOSIS — K922 Gastrointestinal hemorrhage, unspecified: Secondary | ICD-10-CM

## 2023-03-25 LAB — CBC
HCT: 24.8 % — ABNORMAL LOW (ref 39.0–52.0)
Hemoglobin: 7.9 g/dL — ABNORMAL LOW (ref 13.0–17.0)
MCH: 30.4 pg (ref 26.0–34.0)
MCHC: 31.9 g/dL (ref 30.0–36.0)
MCV: 95.4 fL (ref 80.0–100.0)
Platelets: 421 10*3/uL — ABNORMAL HIGH (ref 150–400)
RBC: 2.6 MIL/uL — ABNORMAL LOW (ref 4.22–5.81)
RDW: 14.6 % (ref 11.5–15.5)
WBC: 10.2 10*3/uL (ref 4.0–10.5)
nRBC: 0 % (ref 0.0–0.2)

## 2023-03-25 LAB — MAGNESIUM: Magnesium: 2.1 mg/dL (ref 1.7–2.4)

## 2023-03-25 LAB — PROTIME-INR
INR: 1.9 — ABNORMAL HIGH (ref 0.8–1.2)
Prothrombin Time: 22 s — ABNORMAL HIGH (ref 11.4–15.2)

## 2023-03-25 LAB — GLUCOSE, CAPILLARY
Glucose-Capillary: 100 mg/dL — ABNORMAL HIGH (ref 70–99)
Glucose-Capillary: 123 mg/dL — ABNORMAL HIGH (ref 70–99)
Glucose-Capillary: 76 mg/dL (ref 70–99)
Glucose-Capillary: 92 mg/dL (ref 70–99)

## 2023-03-25 LAB — LACTATE DEHYDROGENASE: LDH: 236 U/L — ABNORMAL HIGH (ref 98–192)

## 2023-03-25 MED ORDER — FUROSEMIDE 20 MG PO TABS
20.0000 mg | ORAL_TABLET | Freq: Once | ORAL | Status: AC
  Administered 2023-03-25: 20 mg via ORAL
  Filled 2023-03-25: qty 1

## 2023-03-25 MED ORDER — WARFARIN SODIUM 2.5 MG PO TABS
2.5000 mg | ORAL_TABLET | Freq: Once | ORAL | Status: AC
  Administered 2023-03-25: 2.5 mg via ORAL
  Filled 2023-03-25: qty 1

## 2023-03-25 NOTE — Progress Notes (Signed)
Advanced Heart Failure VAD Team Note  PCP-Cardiologist: Nicki Guadalajara, MD  AHF: Dr. Gala Romney   Subjective:    9/12: s/p HM3 LVAD 9/24: discharged to CIR  9/25: Innumerable PIs on VAD. Suspected to be dry. Stopped spiro and losartan. Given LR bolus 500 cc  INR 1.9   No lightheadedness; doing well in therapy.  As per nurse, patient had bright red blood in his stool.   LVAD INTERROGATION:  HeartMate III LVAD:   Flow 4.5 liters/min, speed 5500, power 4, PI 3.1.   >30PI events  Objective:    Vital Signs:   Temp:  [98 F (36.7 C)-98.9 F (37.2 C)] 98.4 F (36.9 C) (09/29 0801) Pulse Rate:  [54-83] 82 (09/29 0805) Resp:  [17-19] 18 (09/29 0801) BP: (81-114)/(65-90) 97/83 (09/29 0801) SpO2:  [98 %-100 %] 100 % (09/29 0801) Weight:  [70.1 kg] 70.1 kg (09/29 0547) Last BM Date : 03/24/23 Mean arterial Pressure MAP 80s  Intake/Output:   Intake/Output Summary (Last 24 hours) at 03/25/2023 1142 Last data filed at 03/25/2023 0947 Gross per 24 hour  Intake 483 ml  Output 800 ml  Net -317 ml     Physical Exam   Physical Exam: GENERAL: No acute distress. Sitting in the chair.  HEENT: normal  NECK: Supple, JVP 8   .  2+ bilaterally, no bruits.  No lymphadenopathy or thyromegaly appreciated.   CARDIAC:  Mechanical heart sounds with LVAD hum present.  LUNGS:  Clear to auscultation bilaterally.  ABDOMEN:  Soft, round, nontender, positive bowel sounds x4.     LVAD exit site: .  Dressing dry and intact.  No erythema or drainage.  Stabilization device present and accurately applied.   EXTREMITIES:  Warm and dry, no cyanosis, clubbing, rash or edema  NEUROLOGIC:  Alert and oriented x 3.    No aphasia.  No dysarthria.  Affect flat.    EKG    N/A   Labs   Basic Metabolic Panel: Recent Labs  Lab 03/19/23 0530 03/20/23 0530 03/21/23 0448 03/23/23 0339 03/24/23 0402 03/25/23 0355  NA 131* 128* 131*  --  130*  --   K 4.0 4.0 4.2  --  4.3  --   CL 98 98 99  --  98  --   CO2  25 21* 21*  --  22  --   GLUCOSE 99 109* 80  --  95  --   BUN 19 20 19   --  21  --   CREATININE 1.17 1.12 1.11  --  1.06  --   CALCIUM 8.2* 7.9* 8.5*  --  8.4*  --   MG 2.1 2.0  --  2.0 1.9 2.1    Liver Function Tests: Recent Labs  Lab 03/21/23 0448  AST 112*  ALT 131*  ALKPHOS 82  BILITOT 1.1  PROT 6.2*  ALBUMIN 2.5*   No results for input(s): "LIPASE", "AMYLASE" in the last 168 hours. No results for input(s): "AMMONIA" in the last 168 hours.  CBC: Recent Labs  Lab 03/20/23 0548 03/21/23 0448 03/23/23 0339 03/24/23 0402 03/25/23 0355  WBC 18.0* 15.4* 12.9* 9.2 10.2  NEUTROABS  --  13.0*  --   --   --   HGB 9.2* 9.2* 9.0* 8.2* 7.9*  HCT 28.5* 29.0* 28.0* 25.9* 24.8*  MCV 94.1 93.9 94.9 93.5 95.4  PLT 464* 528* 503* 451* 421*    INR: Recent Labs  Lab 03/21/23 0448 03/22/23 0323 03/23/23 0339 03/24/23 0402 03/25/23 0355  INR  2.5* 2.4* 2.1* 2.0* 1.9*    Other results: EKG:    Imaging   No results found.   Medications:     Scheduled Medications:  amiodarone  200 mg Oral BID   aspirin EC  81 mg Oral Daily   atorvastatin  80 mg Oral Daily   bisacodyl  10 mg Oral Daily   Or   bisacodyl  10 mg Rectal Daily   Chlorhexidine Gluconate Cloth  6 each Topical BID   chlorpheniramine-HYDROcodone  5 mL Oral Q12H   docusate sodium  200 mg Oral Daily   feeding supplement  237 mL Oral Q24H   finasteride  5 mg Oral Daily   insulin aspart  0-24 Units Subcutaneous TID WC   multivitamin with minerals  1 tablet Oral Daily   pantoprazole  40 mg Oral Daily   polyethylene glycol  17 g Oral Daily   sodium chloride flush  10-40 mL Intracatheter Q12H   tamsulosin  0.4 mg Oral QPC supper   warfarin  2.5 mg Oral ONCE-1600   Warfarin - Pharmacist Dosing Inpatient   Does not apply q1600    Infusions:  sodium chloride Stopped (03/20/23 2025)    PRN Medications: sodium chloride, oxyCODONE, phenylephrine, traMADol, traZODone   Patient Profile   67 y/o male w/  chronic systolic heart failure due to ICM 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 Now s/p HM3 LVAD 9/12. Discharged to CIR on 9/24    Assessment/Plan:    1. Acute on chronic combined systolic and diastolic heart failure>>Low Output S/p HM3 LVAD on 9/12.  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 NYHA IV on admission.  Echo 02/27/23: EF <20%, LV with GHK, RV mildly reduced, GIIDD, LA mod dilated, mild MR.  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 GDMT limited by renal function and hypotension.  S/P HM3 LVAD implant 9/12.  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 Intra-op TEE LVEF 15%, RV normal.  - Ramp ECHO 03/13/23. Speed increased to 5500. Pain controlled with tylenol.  - MAP 80s. Euvolemic on exam - Off GDMT w/ soft BPs  - LDH stable - INR 1.9  Discussed dosing with PharmD personally. - Bright red blood in stool today; slight decrease in Hgb to 7.9. Will monitor. If Hgb drops or he has recurrence plan to consult GI. Stop aspirin today.  - Mildly hypervolemic; lasix 40mg  x 1.   2. 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  -  No chest pain.  - on ASA/statin   3. AKI on CKD stage 3: Creatinine baseline 1.6-1.8 - cardiorenal, resolved w/ LVAD support - avoid hypotension. Holding GDMT per above  - sCr stable.   4. Mural thrombus: on Warfarin.  - INR 1.9  5. 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 Anemia: Post-op. S/p 1UPRBCs.  - Hgb 7.9; has down trended over the past 2 days. BRBPR; will monitor closely.   7. Atrial fibrillation:  - continue amio 200 mg twice a day   - On coumadin. INR 2  8. Hematuria:  - Urology consulted. Started on proscar.  - Suspect related to trauma with foley insertion.  - resolved, Hgb stable. Continue to follow.   9. UTI  - Completed  antibiotic course.   - WBCs  down trending.   10. Deconditioning:  - CIR.  Appreciate their assistance  I reviewed the LVAD parameters from today, and compared the results to the patient's prior recorded data.  No programming changes were made.  The LVAD is functioning within specified parameters.  The patient performs LVAD self-test daily.  LVAD interrogation was negative for any significant power changes, alarms or PI events/speed drops.  LVAD equipment check completed and is in good working order.  Back-up equipment present.   LVAD education done on emergency procedures and precautions and reviewed exit site care.  Length of Stay: 5  Anginette Espejo, DO 03/25/2023, 11:42 AM  VAD Team --- VAD ISSUES ONLY--- Pager [phone] (7am - 7am)  Advanced Heart Failure Team  Pager [phone] (M-F; 7a - 5p)  Please contact [facility] for night-coverage after hours (5p -7a ) and weekends on [email or web address]

## 2023-03-25 NOTE — Progress Notes (Signed)
Occupational Therapy Session Note  Patient Details  Name: Steven [name] MRN: [id number] Date of Birth: [birth year]  Today's Date: 03/25/2023 OT Individual Time: 1345-1430 OT Individual Time Calculation (min): 45 min    Short Term Goals: Week 1:  OT Short Term Goal 1 (Week 1): STG's=LTGs d/t LOS  Skilled Therapeutic Interventions/Progress Updates:    1:1 Pt received in the recliner. Pt reported needed to have a BM. Pt with large BM but was bloody - NT and RN made aware. Pt required A to clean up after BM and asked for help to bathe peri area in standing. Pt choose to bathe at the sink in therapy; demonstrate safe management of LVAD lines. Discussed home activities and returning to work with LVAD. Pt able to move around the room mod I in session without AD.   Pt left sitting up in recliner with call bell in hand.   Therapy Documentation Precautions:  Precautions Precautions: Sternal, Other (comment) Restrictions Weight Bearing Restrictions: Yes RUE Weight Bearing: Non weight bearing LUE Weight Bearing: Non weight bearing Other Position/Activity Restrictions: sternal precautions only  Pain:  No c/o pain in session   Therapy/Group: Individual Therapy  [facility] 03/25/2023, 2:43 PM

## 2023-03-25 NOTE — Progress Notes (Signed)
Physical Therapy Session Note  Patient Details  Name: Steven [name] MRN: [id number] Date of Birth: 1956/02/18  Today's Date: 03/25/2023 PT Individual Time: 0805-0920 PT Individual Time Calculation (min): 75 min   Short Term Goals: Week 1:  PT Short Term Goal 1 (Week 1): = to LTGs based on ELOS  Skilled Therapeutic Interventions/Progress Updates:    Pt received sitting in recliner with nurse present for morning medications. Pt agreeable to therapy session.   Pt threaded on pants without assist; however, initiated coming to stand prior to strapping LVAD monitor around his neck - education on not standing up unless the monitor strap is around him to avoid drive line from getting pulled on.   Requires education to sit down before taking monitor strap off his neck in order to doff dirty shirt and don clean shirt.  Pt does well directing care to grab the items he needs to swap from wall to battery power (batteries, clips, holster) without cuing. Therapist provides him the items and he is able to swap from wall to battery with cuing only for how to don battery holster - educated him to put it on like a jacket with visual demonstration, but pt still requires min A to put it on correctly. Education to also untangle his lines while he is hooked up to batteries.  Pt recalls need to perform self test without cuing and performs correctly. Reports he is learning how to manage his LVAD "little by little."  Sit<>stands, no AD, with close supervision for safety progressing to independent.    Pt collects items for back-up bag without cuing.   Gait training ~250ft to main therapy gym, no AD, with close supervision for safety but no instability noted - decreased, although adequate gait speed with reciprocal stepping pattern.  Stair navigation training ascending/descending 12 steps (6" height) NOT using HRs with CGA progressing to close SBA for safety - reciprocal pattern on ascent and step-to leading with L  LE on descent progressing towards reciprocal pattern.   Dynamic gait challenge including the following:  - forward reciprocal stepping over 5x 6" high hurdles, no UE support, with only CGA for safety but no LOB - side stepping down/back x2 each direction over 5x 6" hurdles, no UE support, with CGA but no LOB   Dynamic balance challenge with dual-task cross body reaching of stepping forward/backwards over 6" hurdle, no UE support, while picking up black or blue resistance clothespins from 8" step and then placing them on basketball goal net - 2x 10 sets of clothespins without seated break - CGA for safety but only 1x minor L anterior LOB and pt able to correct without increased assist.  Dynamic balance and B LE strengthening tasks of stepping on/off 8" step, no UE support, and then raising opposite knee to chest and contralateral arm (like a dead bug movement) - x10 reps + x5 reps each LE leading - requires min A for balance to get started progressed to CGA.  Gait ~219ft back to his room, no AD, with distant supervision progressing to independent - continues to have slow gait speed but adequate and with a reciprocal stepping pattern.  Pt able to swap from battery back to wall power; however, pt accidentally trying to connect the wall line to the battery, rather than taking his line from battery clip to the wall line requiring cuing to correct.  Pt left seated in recliner with needs in reach, seat belt alarm on, and lines intact.   Therapy  Documentation Precautions:  Precautions Precautions: Sternal, Other (comment) Restrictions Weight Bearing Restrictions: Yes RUE Weight Bearing: Non weight bearing LUE Weight Bearing: Non weight bearing Other Position/Activity Restrictions: sternal precautions only   Pain:  Denies pain during session.   Therapy/Group: Individual Therapy  Ginny Forth , PT, DPT, NCS, CSRS 03/25/2023, 7:50 AM

## 2023-03-25 NOTE — Progress Notes (Signed)
PROGRESS NOTE   Subjective/Complaints:  Pt denis hx of hemorrhoids , per RN had BRBPR , pt denies abd pain  ROS- denies CP, SOB + Leg swelling-improved  Objective:   No results found. Recent Labs    03/24/23 0402 03/25/23 0355  WBC 9.2 10.2  HGB 8.2* 7.9*  HCT 25.9* 24.8*  PLT 451* 421*   Recent Labs    03/24/23 0402  NA 130*  K 4.3  CL 98  CO2 22  GLUCOSE 95  BUN 21  CREATININE 1.06  CALCIUM 8.4*    Intake/Output Summary (Last 24 hours) at 03/25/2023 1019 Last data filed at 03/25/2023 0947 Gross per 24 hour  Intake 483 ml  Output 800 ml  Net -317 ml        Physical Exam: Vital Signs Blood pressure 97/83, pulse 82, temperature 98.4 F (36.9 C), temperature source Oral, resp. rate 18, height 5\' 9"  (1.753 m), weight 70.1 kg, SpO2 100%.  General: No acute distress Mood and affect are appropriate Heart: Regular rate and rhythm no rubs murmurs or extra sounds Lungs: Clear to auscultation, breathing unlabored, no rales or wheezes Abdomen: Positive bowel sounds, soft nontender to palpation, nondistended Extremities: No clubbing, cyanosis, or edema Skin: No evidence of breakdown, no evidence of rash  Neuro:   Patient is alert sitting in chair working with OT.  Follows commands.  Oriented x 3. Normal language, speech. Functional memory and awareness. UE motor 4/5. LE motor 3+/5 HF, KE 4/5, ADF/PF 4+/5. No sensory deficits appreciated. Normal muscle tone.  Cranial nerves II through XII grossly intact. Musculoskeletal: No joint swelling noted   Assessment/Plan: 1. Functional deficits which require 3+ hours per day of interdisciplinary therapy in a comprehensive inpatient rehab setting. Physiatrist is providing close team supervision and 24 hour management of active medical problems listed below. Physiatrist and rehab team continue to assess barriers to discharge/monitor patient progress toward functional and  medical goals  Care Tool:  Bathing    Body parts bathed by patient: Right arm, Left arm, Chest, Abdomen, Front perineal area, Right upper leg, Left upper leg, Face   Body parts bathed by helper: Right lower leg, Left lower leg     Bathing assist Assist Level: Minimal Assistance - Patient > 75%     Upper Body Dressing/Undressing Upper body dressing   What is the patient wearing?: Pull over shirt    Upper body assist Assist Level: Contact Guard/Touching assist    Lower Body Dressing/Undressing Lower body dressing      What is the patient wearing?: Pants, Incontinence brief     Lower body assist Assist for lower body dressing: Moderate Assistance - Patient 50 - 74%     Toileting Toileting    Toileting assist Assist for toileting: Minimal Assistance - Patient > 75%     Transfers Chair/bed transfer  Transfers assist     Chair/bed transfer assist level: Supervision/Verbal cueing     Locomotion Ambulation   Ambulation assist      Assist level: Supervision/Verbal cueing Assistive device: No Device Max distance: 362ft   Walk 10 feet activity   Assist     Assist level: Supervision/Verbal cueing Assistive device:  No Device   Walk 50 feet activity   Assist    Assist level: Supervision/Verbal cueing Assistive device: No Device    Walk 150 feet activity   Assist    Assist level: Supervision/Verbal cueing Assistive device: No Device    Walk 10 feet on uneven surface  activity   Assist Walk 10 feet on uneven surfaces activity did not occur: Safety/medical concerns         Wheelchair     Assist Is the patient using a wheelchair?: No             Wheelchair 50 feet with 2 turns activity    Assist            Wheelchair 150 feet activity     Assist          Blood pressure 97/83, pulse 82, temperature 98.4 F (36.9 C), temperature source Oral, resp. rate 18, height 5\' 9"  (1.753 m), weight 70.1 kg, SpO2  100%.  Medical Problem List and Plan: 1. Functional deficits secondary to debility after heart failure/ LVAD implant 9/12.   -Sternal precautions             -patient may not yet shower             -ELOS/Goals: 7-10 days, mod I goals with PT/OT  -Continue CIR  -Estimated discharge 10/1  -Continue education regarding LVAD  -IPOC note completed 2.  Antithrombotics: -DVT/anticoagulation:  Pharmaceutical: Coumadin             -antiplatelet therapy: Aspirin 81 mg daily 3. Pain Management: Oxycodone/tramadol as needed 4. Mood/Behavior/Sleep: N/A             -antipsychotic agents: N/A 5. Neuropsych/cognition: This patient is capable of making decisions on his own behalf.  -Seen by neuropsychology for adjustment disorder 6. Skin/Wound Care: Routine skin checks             -local care to drive site  7. Fluids/Electrolytes/Nutrition: encourage PO             -Hyponatremia, 9/25 stable at 131 continue to monitor  -BMP every 24 hours, sodium 131 9/28 8.  Acute on chronic combined systolic and diastolic congestive heart failure.  Follow-up per heart failure team.  Spironolactone and losartan number discontinued by heart failure team             -daily weights  -9/27 weight stable, lower extremity edema appears improved, heart failure team following appreciate assistance    Filed Weights   03/23/23 1056 03/24/23 0500 03/25/23 0547  Weight: 68.1 kg 70.8 kg 70.1 kg    9.  Acute blood loss anemia.  Follow-up CBC  -9/27 hemoglobin stable at 9.8    Latest Ref Rng & Units 03/25/2023    3:55 AM 03/24/2023    4:02 AM 03/23/2023    3:39 AM  CBC  WBC 4.0 - 10.5 K/uL 10.2  9.2  12.9   Hemoglobin 13.0 - 17.0 g/dL 7.9  8.2  9.0   Hematocrit 39.0 - 52.0 % 24.8  25.9  28.0   Platelets 150 - 400 K/uL 421  451  503    May be related to GI blood loss, have notified cardiology who will eval  , pt needs to be on warfarin to reduce thrombosis risk post LVAD , Afib hx as well.  Abd exam ok.  Defer to  cardiology regarding need for GI involvement . Cont daily CBC 10.  Hematuria/UTI/persistent leukocytosis.  Placed on  Proscar per urology services.  Complete empiric course of Rocephin.  -9/25 WBC down to 15.4. 11.  Hyperlipidemia.  Lipitor 12.  Prediabetes.  Hemoglobin A1c 6.6.  SSI  -9/26 CBGs well-controlled, continue to monitor for now 13.  CKD stage III a?  Follow-up chemistries             -9/25 creatinine stable at 1.11.    Latest Ref Rng & Units 03/24/2023    4:02 AM 03/21/2023    4:48 AM 03/20/2023    5:30 AM  BMP  Glucose 70 - 99 mg/dL 95  80  409   BUN 8 - 23 mg/dL 21  19  20    Creatinine 0.61 - 1.24 mg/dL 8.11  9.14  7.82   Sodium 135 - 145 mmol/L 130  131  128   Potassium 3.5 - 5.1 mmol/L 4.3  4.2  4.0   Chloride 98 - 111 mmol/L 98  99  98   CO2 22 - 32 mmol/L 22  21  21    Calcium 8.9 - 10.3 mg/dL 8.4  8.5  7.9     14.  Atrial fibrillation.  Amiodarone 200 mg twice daily.  Continue Coumadin  -9/26 INR 2.4  9/27 heart rate stable, INR 2.1     03/25/2023    8:05 AM 03/25/2023    8:01 AM 03/25/2023    5:47 AM  Vitals with BMI  Weight   154 lbs 9 oz  BMI   22.81  Systolic  97 90  Diastolic  83 65  Pulse 82 64 58    15.  Transaminitis.   -Suspect cardiac related.  Stable from prior.  Avoid hepatotoxic medications if possible.   LOS: 5 days A FACE TO FACE EVALUATION WAS PERFORMED  Erick Colace 03/25/2023, 10:19 AM

## 2023-03-25 NOTE — Progress Notes (Signed)
Physical Therapy Session Note  Patient Details  Name: Steven [name] MRN: [id number] Date of Birth: 12-23-1955  Today's Date: 03/25/2023 PT Individual Time: 1000-1120 PT Individual Time Calculation (min): 80 min   Short Term Goals: Week 1:  PT Short Term Goal 1 (Week 1): = to LTGs based on ELOS  Skilled Therapeutic Interventions/Progress Updates:      Pt seated in recliner upon arrival. Pt agreeable to therapy. Pt denies any pain.   Therapist instructed pt to gather all the materials needed to transition from wall to battery power. Pt ambulated with no AD and supervision, verbal cues provided to place heart mate around neck prior to gathering items, pt gathered all items needed without cuing. Pt performed ambulatory transfer to recliner with supervision, verbal cues provided to ensure pt back of legs touching chair prior to sitting as pt began initiating stand to sit unsafely. Pt donned holster and transitioned from wall to battery power with supervision, pt demos improved technique without tangling parts,Pt collected all items for back up bag without cuing.   Discussed family training, pt reports Steven [name] plans to attend sessions 9/30. Therapist utilized session to assess all mobility. Pt ambulated from room to ortho gym, ortho gym to apartment, apartment, to main gym, and main gym to room with no AD and mod I. Pt ambulated up and down ramp with mod I. Pt performed car transfer with supervision, verbal cues provided for technique, with emphasis on sitting down then bringing legs in for safety. Pt returned demonstration with mod I. Pt ascended/descended 12 steps with R HR and reciprocal gait with mod I. Pt picked up object off of floor with mod I. Pt performed bed mobility on apartment bed and ambulatory transfer onto recliner and couch with mod I.   Pt navigating 4x2 10 inch hurdles and 4x2 6 inch hurdles, and x4 foam balance beam with CGA/intermittent min A for LOB progressing to CGA/close  supervision, verbal cues provided for technique.   Remainder of treatment session focused on safety with LVAD. Therapist typed and printed a list of 3 emergency contacts for pt with pt emphasizing who to include.  Handout of emergency contacts placed in pt back up bag. Pt aware of location.   Pt completed 2nd trial of transitioning from wall to battery power, with instructions from therapist to pretend as though I am not there. Pt transitioned from wall to battery power with improved recall, but required verbal cues to bring back up bag. Pt questioning if he needs to do this at home when going up stairs or to mailbox. Education provided that anytime pt is disconnecting from wall to leave house, he needs to have back-up bag with him.  Pt completed 2nd trial of transitioning battery power to wall, with instructions for therapist to pretend as though I am not here. Pt requiring superivsion, verbal cues provided intermittently when pt disconnected black wire from battery, pt dropped it and needed assist to find correct wall wire to attach it to.   Pt seated in recliner at end of session with all needs within reach and seatbelt alarm on. Plan to perform Berg and FGA next session.     Therapy Documentation Precautions:  Precautions Precautions: Sternal, Other (comment) Restrictions Weight Bearing Restrictions: Yes RUE Weight Bearing: Non weight bearing LUE Weight Bearing: Non weight bearing Other Position/Activity Restrictions: sternal precautions only   Therapy/Group: Individual Therapy  [facility] Ambrose Finland, L[place], DPT  03/25/2023, 7:43 AM

## 2023-03-25 NOTE — Progress Notes (Incomplete)
Occupational Therapy Discharge Summary  Patient Details  Name: Steven [name] MRN: [id number] Date of Birth: 1956/02/02  Date of Discharge from OT service:March 26, 2023  {CHL IP REHAB OT TIME CALCULATIONS:304400400}   Patient has met {NUMBERS 0-12:18577} of {NUMBERS 0-12:18577} long term goals due to {due HC:6237628}.  Patient to discharge at overall {LOA:3049010} level.  Patient's care partner {care partner:3041650} to provide the necessary {assistance:3041652} assistance at discharge.    Reasons goals not met: ***  Recommendation:  Patient will benefit from ongoing skilled OT services in {setting:3041680} to continue to advance functional skills in the area of {ADL/iADL:3041649}.  Equipment: {equipment:3041657}  Reasons for discharge: {Reason for discharge:3049018}  Patient/family agrees with progress made and goals achieved: {Pt/Family agree with progress/goals:3049020}  OT Discharge Precautions/Restrictions    General   Vital Signs Therapy Vitals Temp: 98.9 F (37.2 C) Temp Source: Oral Pulse Rate: 70 Resp: 17 BP: 99/84 Patient Position (if appropriate): Sitting Pain   ADL ADL Eating: Modified independent Where Assessed-Eating: Chair Grooming: Setup Where Assessed-Grooming: Sitting at sink Upper Body Bathing: Contact guard Where Assessed-Upper Body Bathing: Sitting at sink Lower Body Bathing: Moderate assistance Where Assessed-Lower Body Bathing: Standing at sink, Sitting at sink Upper Body Dressing: Contact guard Where Assessed-Upper Body Dressing: Sitting at sink Lower Body Dressing: Minimal assistance Where Assessed-Lower Body Dressing: Standing at sink, Sitting at sink Toileting: Minimal assistance Where Assessed-Toileting: Toilet, Hydrologist: Curator Method: Proofreader: Engineer, technical sales: Not assessed Film/video editor: Not assessed ADL Comments: min A LB  selfcare, close S UB self care sink side, CGA without AD, support for LVAD lines Vision   Perception    Praxis   Cognition   Sensation   Motor    Mobility     Trunk/Postural Assessment     Balance   Extremity/Trunk Assessment       Limmie Patricia, OTR/L,CBIS  Supplemental OT - [facility] and [facility] Secure Chat Preferred   03/25/2023, 9:51 PM

## 2023-03-25 NOTE — Progress Notes (Signed)
ANTICOAGULATION CONSULT NOTE - Follow-up  Pharmacy Consult for warfarin management Indication:  LVAD  No Known Allergies  Patient Measurements: Height: 5\' 9"  (175.3 cm) Weight: 70.1 kg (154 lb 8.7 oz) IBW/kg (Calculated) : 70.7 Heparin Dosing Weight: 73.5 kg  Vital Signs: Temp: 98.4 F (36.9 C) (09/29 0547) Temp Source: Oral (09/29 0547) BP: 97/83 (09/29 0801) Pulse Rate: 64 (09/29 0801)  Labs: Recent Labs    03/23/23 0339 03/24/23 0402 03/25/23 0355  HGB 9.0* 8.2* 7.9*  HCT 28.0* 25.9* 24.8*  PLT 503* 451* 421*  LABPROT 23.7* 22.6* 22.0*  INR 2.1* 2.0* 1.9*  CREATININE  --  1.06  --     Estimated Creatinine Clearance: 66.1 mL/min (by C-G formula based on SCr of 1.06 mg/dL).   Medical History: Past Medical History:  Diagnosis Date   Acute MI, anterior wall (HCC)    AICD (automatic cardioverter/defibrillator) present    CAD (coronary artery disease)    2D ECHO, 07/13/2011 - EF <25%, LV moderatelty dilated, LA moderately dilatedLEXISCAN, 12/14/2011 - moderate-severe perfusion defect seen in the basal anteroseptal, mid anterior, apicacl anterior, apical, apical inferior, and apical lateral regions, post-stress EF 25%, new EKG changes from baseline abnormalities   Cancer (HCC)    Prostate   CHF (congestive heart failure) (HCC) 2012   Hypertension 08/08/2021   Inguinal hernia, left    Pneumonia    November 2023   Pre-diabetes     Medications:  Medications Prior to Admission  Medication Sig Dispense Refill Last Dose   acetaminophen (TYLENOL) 500 MG tablet Take 1,000 mg by mouth every 6 (six) hours as needed for mild pain.      amiodarone (PACERONE) 200 MG tablet Take 1 tablet (200 mg total) by mouth 2 (two) times daily.      aspirin EC 81 MG tablet Take 1 tablet (81 mg total) by mouth daily. Swallow whole.      atorvastatin (LIPITOR) 80 MG tablet Take 1 tablet (80 mg total) by mouth daily. 30 tablet 3    bisacodyl (DULCOLAX) 5 MG EC tablet Take 2 tablets (10 mg  total) by mouth daily.      [EXPIRED] cefTRIAXone 2 g in sodium chloride 0.9 % 100 mL Inject 2 g into the vein daily for 1 day.      Cholecalciferol (VITAMIN D) 125 MCG (5000 UT) CAPS Take 5,000 Units by mouth daily.      docusate sodium (COLACE) 100 MG capsule Take 2 capsules (200 mg total) by mouth daily.      finasteride (PROSCAR) 5 MG tablet Take 1 tablet (5 mg total) by mouth daily.      insulin aspart (NOVOLOG) 100 UNIT/ML injection Inject 0-24 Units into the skin 3 (three) times daily with meals.      losartan (COZAAR) 25 MG tablet Take 1 tablet (25 mg total) by mouth daily.      Multiple Vitamin (MULTIVITAMIN) tablet Take 1 tablet by mouth daily.      pantoprazole (PROTONIX) 40 MG tablet Take 1 tablet (40 mg total) by mouth daily.      spironolactone (ALDACTONE) 25 MG tablet Take 0.5 tablets (12.5 mg total) by mouth daily.      tamsulosin (FLOMAX) 0.4 MG CAPS capsule Take 1 capsule (0.4 mg total) by mouth daily after supper.      torsemide (DEMADEX) 20 MG tablet Take 1 tablet (20 mg total) by mouth daily as needed. Take 1 tablet by mouth every other day.  traMADol (ULTRAM) 50 MG tablet Take 1-2 tablets (50-100 mg total) by mouth every 4 (four) hours as needed for moderate pain.      traZODone (DESYREL) 50 MG tablet Take 1 tablet (50 mg total) by mouth at bedtime as needed for sleep.      warfarin (COUMADIN) 1 MG tablet Take 1 tablet (1 mg total) by mouth one time only at 4 PM.      Scheduled:   amiodarone  200 mg Oral BID   aspirin EC  81 mg Oral Daily   atorvastatin  80 mg Oral Daily   bisacodyl  10 mg Oral Daily   Or   bisacodyl  10 mg Rectal Daily   Chlorhexidine Gluconate Cloth  6 each Topical BID   chlorpheniramine-HYDROcodone  5 mL Oral Q12H   docusate sodium  200 mg Oral Daily   feeding supplement  237 mL Oral Q24H   finasteride  5 mg Oral Daily   insulin aspart  0-24 Units Subcutaneous TID WC   multivitamin with minerals  1 tablet Oral Daily   pantoprazole  40 mg  Oral Daily   polyethylene glycol  17 g Oral Daily   sodium chloride flush  10-40 mL Intracatheter Q12H   tamsulosin  0.4 mg Oral QPC supper   warfarin  2.5 mg Oral ONCE-1600   Warfarin - Pharmacist Dosing Inpatient   Does not apply q1600   Infusions:   sodium chloride Stopped (03/20/23 2025)   PRN: sodium chloride, oxyCODONE, phenylephrine, traMADol, traZODone  Assessment: Pt presented to [facility] ED on 02/26/2023 with shortness of breath and SpO2 of 78 on room air. Pt has PMH of ICM (LAD infarct), HFrEF (EF <20%), LVT, VT s/p ICD, HTN, CKD3, HLD Pt underwent HMIII implantation on 9/12. Warfarin was initiated 9/15.   INR today is therapeutic at 1.9. (-0.1), patient is eating well.  Goal of Therapy:  2.0-2.5 Monitor platelets by anticoagulation protocol: Yes   Plan:  Warfarin 2.5 mg PO x1 Daily INR, CBC, LDH  Thank you Okey Regal, PharmD 03/25/2023

## 2023-03-25 NOTE — Progress Notes (Signed)
Uneventful night. Using purewick at HS per patient's request. PRN trazodone given at 2220. Reminded patient to self check VAD every morning after breakfast. Steven [name] A

## 2023-03-26 ENCOUNTER — Other Ambulatory Visit (HOSPITAL_COMMUNITY): Payer: Self-pay

## 2023-03-26 DIAGNOSIS — E871 Hypo-osmolality and hyponatremia: Secondary | ICD-10-CM

## 2023-03-26 LAB — GLUCOSE, CAPILLARY
Glucose-Capillary: 82 mg/dL (ref 70–99)
Glucose-Capillary: 187 mg/dL — ABNORMAL HIGH (ref 70–99)
Glucose-Capillary: 128 mg/dL — ABNORMAL HIGH (ref 70–99)
Glucose-Capillary: 81 mg/dL (ref 70–99)
Glucose-Capillary: 116 mg/dL — ABNORMAL HIGH (ref 70–99)

## 2023-03-26 LAB — BASIC METABOLIC PANEL
Anion gap: 6 (ref 5–15)
BUN: 15 mg/dL (ref 8–23)
CO2: 25 mmol/L (ref 22–32)
Calcium: 8.3 mg/dL — ABNORMAL LOW (ref 8.9–10.3)
Chloride: 100 mmol/L (ref 98–111)
Creatinine, Ser: 1.21 mg/dL (ref 0.61–1.24)
GFR, Estimated: >60 mL/min (ref >60)
Glucose, Bld: 88 mg/dL (ref 70–99)
Potassium: 3.9 mmol/L (ref 3.5–5.1)
Sodium: 131 mmol/L — ABNORMAL LOW (ref 135–145)

## 2023-03-26 LAB — MAGNESIUM: Magnesium: 1.9 mg/dL (ref 1.7–2.4)

## 2023-03-26 LAB — CBC
HCT: 24.7 % — ABNORMAL LOW (ref 39.0–52.0)
Hemoglobin: 7.8 g/dL — ABNORMAL LOW (ref 13.0–17.0)
MCH: 29.7 pg (ref 26.0–34.0)
MCHC: 31.6 g/dL (ref 30.0–36.0)
MCV: 93.9 fL (ref 80.0–100.0)
Platelets: 419 10*3/uL — ABNORMAL HIGH (ref 150–400)
RBC: 2.63 MIL/uL — ABNORMAL LOW (ref 4.22–5.81)
RDW: 14.6 % (ref 11.5–15.5)
WBC: 9 10*3/uL (ref 4.0–10.5)
nRBC: 0 % (ref 0.0–0.2)

## 2023-03-26 LAB — PROTIME-INR
INR: 1.9 — ABNORMAL HIGH (ref 0.8–1.2)
Prothrombin Time: 22.1 s — ABNORMAL HIGH (ref 11.4–15.2)

## 2023-03-26 LAB — LACTATE DEHYDROGENASE: LDH: 233 U/L — ABNORMAL HIGH (ref 98–192)

## 2023-03-26 MED ORDER — POLYETHYLENE GLYCOL 3350 17 GM/SCOOP PO POWD
17.0000 g | Freq: Every day | ORAL | 0 refills | Status: DC
  Filled 2023-03-26: qty 476, 28d supply, fill #0

## 2023-03-26 MED ORDER — CERTAVITE/ANTIOXIDANTS PO TABS
1.0000 | ORAL_TABLET | Freq: Every day | ORAL | 0 refills | Status: AC
  Filled 2023-03-26: qty 130, 130d supply, fill #0

## 2023-03-26 MED ORDER — POTASSIUM CHLORIDE CRYS ER 20 MEQ PO TBCR
20.0000 meq | EXTENDED_RELEASE_TABLET | Freq: Once | ORAL | Status: AC
  Administered 2023-03-26: 20 meq via ORAL
  Filled 2023-03-26: qty 1

## 2023-03-26 MED ORDER — TRAMADOL HCL 50 MG PO TABS
50.0000 mg | ORAL_TABLET | ORAL | 0 refills | Status: DC | PRN
Start: 2023-03-26 — End: 2023-12-31
  Filled 2023-03-26: qty 35, 6d supply, fill #0

## 2023-03-26 MED ORDER — DOCUSATE SODIUM 100 MG PO CAPS
200.0000 mg | ORAL_CAPSULE | Freq: Every day | ORAL | 0 refills | Status: DC
  Filled 2023-03-26: qty 100, 50d supply, fill #0

## 2023-03-26 MED ORDER — WARFARIN SODIUM 2.5 MG PO TABS
2.5000 mg | ORAL_TABLET | Freq: Once | ORAL | Status: AC
  Administered 2023-03-26: 2.5 mg via ORAL
  Filled 2023-03-26: qty 1

## 2023-03-26 MED ORDER — MAGNESIUM SULFATE 2 GM/50ML IV SOLN
2.0000 g | Freq: Once | INTRAVENOUS | Status: AC
  Administered 2023-03-26: 2 g via INTRAVENOUS
  Filled 2023-03-26: qty 50

## 2023-03-26 MED ORDER — TAMSULOSIN HCL 0.4 MG PO CAPS
0.4000 mg | ORAL_CAPSULE | Freq: Every day | ORAL | 0 refills | Status: DC
  Filled 2023-03-26 – 2023-04-19 (×2): qty 30, 30d supply, fill #0

## 2023-03-26 MED ORDER — TRAZODONE HCL 50 MG PO TABS
50.0000 mg | ORAL_TABLET | Freq: Every evening | ORAL | 0 refills | Status: DC | PRN
  Filled 2023-03-26: qty 30, 30d supply, fill #0

## 2023-03-26 MED ORDER — ATORVASTATIN CALCIUM 80 MG PO TABS
80.0000 mg | ORAL_TABLET | Freq: Every day | ORAL | 0 refills | Status: DC
  Filled 2023-03-26 – 2023-05-14 (×2): qty 30, 30d supply, fill #0

## 2023-03-26 MED ORDER — PANTOPRAZOLE SODIUM 40 MG PO TBEC
40.0000 mg | DELAYED_RELEASE_TABLET | Freq: Every day | ORAL | 0 refills | Status: DC
  Filled 2023-03-26: qty 30, 30d supply, fill #0

## 2023-03-26 MED ORDER — FINASTERIDE 5 MG PO TABS
5.0000 mg | ORAL_TABLET | Freq: Every day | ORAL | 0 refills | Status: DC
  Filled 2023-03-26: qty 30, 30d supply, fill #0

## 2023-03-26 NOTE — Progress Notes (Deleted)
Cardiology Office Note:    Date:  03/26/2023   ID:  SHAINA BEAUDET, DOB 1955-12-27, MRN [id number]  PCP:  [name], MD  Cardiologist:  Nicki Guadalajara, MD  Electrophysiologist:  Thurmon Fair, MD   Referring MD: [name], MD   Chief Complaint: ***  History of Present Illness:    Steven [name] is a 67 y.o. male with a history of CAD with STEMI s/p BMS to LAD in 06/2010 and angioplasty to OM1 in 11/2010, ischemic cardiomyopathy/ chronic HFrEF with EF <20 on most recent Echo in 04/2022 s/p ICD in 02/2011 (gen change in 01/2019), LV thrombus on Coumadin, non-sustained VT, hypertension, hyperlipidemia, pre-diabetes, and CKD stage III who is followed by Dr. Tresa Endo and Dr. Royann Shivers and presents today for follow-up of CAD and CHF.   Patient is originally from [place]. He was admitted in 06/2010 after suffering a large out of hospital anterior wall MI. He presents after a 3 hour delay to the hospital and was taken emergently to the cath labs where he underwent successful PCI with BMS to the LAD. He later underwent angioplasty to OM1 in 11/2010 as well. He had significant LV dysfunction after his MI and initially wore a life vest and then ultimately underwent placement of ICD in 02/2011 for primary prevention of sudden cardiac death given persistent LV dysfunction. Interrogation of his device in the past has shown episodes of NSVT. Echo in 07/2013 showed LVEF of 20-25% with diffuse hypokinesis and akinesis of the entire anteroseptal myocardium and apical region. Also showed a new apical thrombus and he was started on Coumadin. In 01/2014, he was found to have a subdural hematoma after fall where he hit his head on a bar. He required burr holes for subdural hematoma evacuation. Aspirin and Coumadin were initially held but Coumadin was ultimately restarted after several weeks. He underwent an ICD generator change in 01/2019 after reaching ERI. He has had some chronic chest tightness with exertion over the years  and antianginals have been adjusted multiple times. GDMT and antianginals have been limited some by soft BP.    He was admitted in 04/2022 for acute on chronic CHF. Echo showed LVEF of <20% with global hypokinesis and an adherent mass at the inferoseptal apical portion consistent with laminated/ chronic thrombus as well as moderately reduced RV function.  He was admitted again in 09/2022 for acute on chronic CHF. BNP was elevated at 2,147. High-sensitivity troponin was elevated at 43 >> 123 consistent with demand ischemia. Chest x-ray showed diffuse interstitial and airspace opacities (left > right) and a left pleural effusion. He was diuresed 23 lbs and then transitioned to Torsemide at discharge. Discharge weight was 154 lbs. Since then he has been seen twice by the CHF team.   Patient was last seen by me in 12/2022 at which time he did report some weight gain but denied any CHF symptoms. He stated his Torsemide had recently been decreased to ever other day given worsening renal function. He also reported very atypical chest pain that did not sound cardiac in nature. He did not look significantly volume overload on exam. He was continued on every other day Torsemide but was advised that he could take an extra dose of Torsemide as needed for weight gain or worsening edema.   He was recently admitted from 02/26/2023 to 03/20/2023 for acute on chronic CHF. Echo showed LVEF of <20% with global hypokinesis and akinesis of the entire anteroseptal wall, anterior wall, and  apical segment as well as grade 2 diastolic dysfunction, mildly reduced RV function, mild MR, and moderately elevated PASP of 55.59mmHg.      Past Medical History:  Diagnosis Date   Acute MI, anterior wall (HCC)    AICD (automatic cardioverter/defibrillator) present    CAD (coronary artery disease)    2D ECHO, 07/13/2011 - EF <25%, LV moderatelty dilated, LA moderately dilatedLEXISCAN, 12/14/2011 - moderate-severe perfusion defect seen in the  basal anteroseptal, mid anterior, apicacl anterior, apical, apical inferior, and apical lateral regions, post-stress EF 25%, new EKG changes from baseline abnormalities   Cancer [facility]    Prostate   CHF (congestive heart failure) (HCC) 2012   Hypertension 08/08/2021   Inguinal hernia, left    Pneumonia    November 2023   Pre-diabetes     Past Surgical History:  Procedure Laterality Date   CARDIAC CATHETERIZATION  11/25/2010   Predilation balloon-Apex monorail 2x80mm, Cutting balloon-2.25x40mm, resulting in a reduction of 100% stenosis down to less than 10%   CARDIAC CATHETERIZATION  07/12/2010   LAD stented with a 3.5x1mm bare-metal Veriflex stent resulting in a reduction of 100% lesion to 0%   CARDIAC DEFIBRILLATOR PLACEMENT  03/02/2011   Medtronic Protecta XT DR, model #[id number], serial #[id number]   COLONOSCOPY     GOLD SEED IMPLANT N/A 07/21/2021   Procedure: GOLD SEED IMPLANT;  Surgeon: Heloise Purpura, MD;  Location: [facility] ORS;  Service: Urology;  Laterality: N/A;  NEEDS 30 MIN   HERNIA REPAIR  2018   ICD GENERATOR CHANGEOUT N/A 02/24/2019   Procedure: ICD GENERATOR CHANGEOUT;  Surgeon: Thurmon Fair, MD;  Location: [facility] INVASIVE CV LAB;  Service: Cardiovascular;  Laterality: N/A;   INSERTION OF IMPLANTABLE LEFT VENTRICULAR ASSIST DEVICE N/A 03/08/2023   Procedure: INSERTION OF IMPLANTABLE LEFT VENTRICULAR ASSIST DEVICE;  Surgeon: Alleen Borne, MD;  Location: [facility] OR;  Service: Open Heart Surgery;  Laterality: N/A;   PACEMAKER PLACEMENT  07/14/2010   Temporary placement of pacemaker, if rhythm issue continues will need a permanent device   RIGHT HEART CATH N/A 02/28/2023   Procedure: RIGHT HEART CATH;  Surgeon: Iran Ouch, MD;  Location: [facility] INV[facility] CV [facility];  Service: Cardiovascular;  Laterality: N/A;   RIGHT HEART CATH N/A 03/06/2023   Procedure: RIGHT HEART CATH;  Surgeon: Dorthula Nettles, DO;  Location: [facility] CV [facility];  Service: Cardiovascular;  Laterality: N/A;   SPACE  OAR INSTILLATION N/A 07/21/2021   Procedure: SPACE OAR INSTILLATION;  Surgeon: Heloise Purpura, MD;  Location: [facility];  Service: Urology;  Laterality: N/A;   TEE WITHOUT CARDIOVERSION N/A 03/08/2023   Procedure: TRANSESOPHAGEAL ECHOCARDIOGRAM;  Surgeon: Alleen Borne, MD;  Location: [facility] OR;  Service: Open Heart Surgery;  Laterality: N/A;    Current Medications: No outpatient medications have been marked as taking for the 04/06/23 encounter (Appointment) with Corrin Parker, PA-C.     Allergies:   Patient has no known allergies.   Social History   Socioeconomic History   Marital status: Single    Spouse name: Not on file   Number of children: 1   Years of education: Not on file   Highest education level: Bachelor's degree (e.g., BA, AB, BS)  Occupational History   Occupation: works on Systems analyst  Tobacco Use   Smoking status: Former    Current packs/day: 0.00    Types: Cigarettes, Cigars    Quit date: 2012    Years since quitting: 12.7   Smokeless tobacco: Never  Vaping Use  Vaping status: Never Used  Substance and Sexual Activity   Alcohol use: Yes    Alcohol/week: 2.0 - 3.0 standard drinks of alcohol    Types: 2 - 3 drink(s) per week   Drug use: No   Sexual activity: Not Currently  Other Topics Concern   Not on file  Social History Narrative   Not on file   Social Determinants of Health   Financial Resource Strain: Low Risk  (05/08/2022)   Overall Financial Resource Strain (CARDIA)    Difficulty of Paying Living Expenses: Not hard at all  Food Insecurity: No Food Insecurity (02/26/2023)   Hunger Vital Sign    Worried About Running Out of Food in the Last Year: Never true    Ran Out of Food in the Last Year: Never true  Transportation Needs: No Transportation Needs (02/26/2023)   PRAPARE - Administrator, Civil Service (Medical): No    Lack of Transportation (Non-Medical): No  Physical Activity: Not on file  Stress: Not on file  Social Connections:  Unknown (11/08/2021)   Received from [facility]   Social Network    Social Network: Not on file     Family History: The patient's ***family history is not on file.  ROS:   Please see the history of present illness.    *** All other systems reviewed and are negative.  EKGs/Labs/Other Studies Reviewed:    The following studies were reviewed today: ***  EKG:  EKG *** ordered today. EKG personally reviewed and demonstrates ***.  Recent Labs: 04/18/2022: NT-Pro BNP 587 03/02/2023: TSH 2.421 03/15/2023: B Natriuretic Peptide 543.6 03/21/2023: ALT 131 03/26/2023: BUN 15; Creatinine, Ser 1.21; Hemoglobin 7.8; Magnesium 1.9; Platelets 419; Potassium 3.9; Sodium 131  Recent Lipid Panel    Component Value Date/Time   CHOL 101 03/02/2023 0500   CHOL 121 04/18/2022 0958   CHOL 161 07/01/2013 0838   TRIG 35 03/02/2023 0500   TRIG 108 07/01/2013 0838   HDL 33 (L) 03/02/2023 0500   HDL 45 04/18/2022 0958   HDL 43 07/01/2013 0838   CHOLHDL 3.1 03/02/2023 0500   VLDL 7 03/02/2023 0500   LDLCALC 61 03/02/2023 0500   LDLCALC 64 04/18/2022 0958   LDLCALC 96 07/01/2013 0838    Physical Exam:    Vital Signs: There were no vitals taken for this visit.    Wt Readings from Last 3 Encounters:  03/25/23 154 lb 8.7 oz (70.1 kg)  03/20/23 149 lb 11.1 oz (67.9 kg)  02/01/23 157 lb (71.2 kg)     General: 67 y.o. male in no acute distress. HEENT: Normocephalic and atraumatic. Sclera clear. EOMs intact. Neck: Supple. No carotid bruits. No JVD. Heart: *** RRR. Distinct S1 and S2. No murmurs, gallops, or rubs. Radial and distal pedal pulses 2+ and equal bilaterally. Lungs: No increased work of breathing. Clear to ausculation bilaterally. No wheezes, rhonchi, or rales.  Abdomen: Soft, non-distended, and non-tender to palpation. Bowel sounds present in all 4 quadrants.  MSK: Normal strength and tone for age. *** Extremities: No lower extremity edema.    Skin: Warm and dry. Neuro:  Alert and oriented x3. No focal deficits. Psych: Normal affect. Responds appropriately.   Assessment:    No diagnosis found.  Plan:     Disposition: Follow up in ***   Medication Adjustments/Labs and Tests Ordered: Current medicines are reviewed at length with the patient today.  Concerns regarding medicines are outlined above.  No orders of the  defined types were placed in this encounter.  No orders of the defined types were placed in this encounter.   There are no Patient Instructions on file for this visit.   Signed, Corrin Parker, PA-C  03/26/2023 11:04 AM     [facility]

## 2023-03-26 NOTE — Progress Notes (Signed)
Slept good, PRN trazodone given at 2218. Flat affect. Productive cough, thick white/clear sputum. Denies pain. BLE edema. Steven [name]

## 2023-03-26 NOTE — Plan of Care (Signed)
  Problem: RH Balance Goal: LTG Patient will maintain dynamic standing with ADLs (OT) Description: LTG:  Patient will maintain dynamic standing balance with assist during activities of daily living (OT)  Outcome: Completed/Met   Problem: Sit to Stand Goal: LTG:  Patient will perform sit to stand in prep for activites of daily living with assistance level (OT) Description: LTG:  Patient will perform sit to stand in prep for activites of daily living with assistance level (OT) Outcome: Completed/Met   Problem: RH Grooming Goal: LTG Patient will perform grooming w/assist,cues/equip (OT) Description: LTG: Patient will perform grooming with assist, with/without cues using equipment (OT) Outcome: Completed/Met   Problem: RH Bathing Goal: LTG Patient will bathe all body parts with assist levels (OT) Description: LTG: Patient will bathe all body parts with assist levels (OT) Outcome: Completed/Met   Problem: RH Dressing Goal: LTG Patient will perform upper body dressing (OT) Description: LTG Patient will perform upper body dressing with assist, with/without cues (OT). Outcome: Completed/Met Goal: LTG Patient will perform lower body dressing w/assist (OT) Description: LTG: Patient will perform lower body dressing with assist, with/without cues in positioning using equipment (OT) Outcome: Completed/Met   Problem: RH Toileting Goal: LTG Patient will perform toileting task (3/3 steps) with assistance level (OT) Description: LTG: Patient will perform toileting task (3/3 steps) with assistance level (OT)  Outcome: Completed/Met   Problem: RH Simple Meal Prep Goal: LTG Patient will perform simple meal prep w/assist (OT) Description: LTG: Patient will perform simple meal prep with assistance, with/without cues (OT). Outcome: Completed/Met   Problem: RH Light Housekeeping Goal: LTG Patient will perform light housekeeping w/assist (OT) Description: LTG: Patient will perform light housekeeping  with assistance, with/without cues (OT). Outcome: Completed/Met

## 2023-03-26 NOTE — Plan of Care (Signed)
  Problem: RH Balance °Goal: LTG Patient will maintain dynamic sitting balance (PT) °Description: LTG:  Patient will maintain dynamic sitting balance with assistance during mobility activities (PT) °Outcome: Completed/Met °Goal: LTG Patient will maintain dynamic standing balance (PT) °Description: LTG:  Patient will maintain dynamic standing balance with assistance during mobility activities (PT) °Outcome: Completed/Met °  °Problem: Sit to Stand °Goal: LTG:  Patient will perform sit to stand with assistance level (PT) °Description: LTG:  Patient will perform sit to stand with assistance level (PT) °Outcome: Completed/Met °  °Problem: RH Bed Mobility °Goal: LTG Patient will perform bed mobility with assist (PT) °Description: LTG: Patient will perform bed mobility with assistance, with/without cues (PT). °Outcome: Completed/Met °  °Problem: RH Bed to Chair Transfers °Goal: LTG Patient will perform bed/chair transfers w/assist (PT) °Description: LTG: Patient will perform bed to chair transfers with assistance (PT). °Outcome: Completed/Met °  °Problem: RH Car Transfers °Goal: LTG Patient will perform car transfers with assist (PT) °Description: LTG: Patient will perform car transfers with assistance (PT). °Outcome: Completed/Met °  °Problem: RH Ambulation °Goal: LTG Patient will ambulate in controlled environment (PT) °Description: LTG: Patient will ambulate in a controlled environment, # of feet with assistance (PT). °Outcome: Completed/Met °Goal: LTG Patient will ambulate in home environment (PT) °Description: LTG: Patient will ambulate in home environment, # of feet with assistance (PT). °Outcome: Completed/Met °  °Problem: RH Stairs °Goal: LTG Patient will ambulate up and down stairs w/assist (PT) °Description: LTG: Patient will ambulate up and down # of stairs with assistance (PT) °Outcome: Completed/Met °  °

## 2023-03-26 NOTE — Progress Notes (Signed)
LVAD Coordinator Rounding Note:  Admitted 02/26/23 due to CHF.   HM3 LVAD implanted on 03/08/23 by Dr Laneta Simmers under DT criteria.  9/12: HM3 LVAD Implant. Intra-op TEE LVEF 15%, RV normal  9/13: Extubated.  Driveline pulled back, 1 inch of Velour exposed.  9/14: Went into atrial fibrillation with RVR,  IV amiodarone started.  9/15: Hematuria.  9/17: Urology consulted for hematuria. Proscar added. Given 1 unit of blood. Ramp Echo speed increased to 5500. 03/20/23: Discharged to CIR  Pt lying in bed on my arrival. Denies complaints. Still with productive cough. Encouraged to use IS hourly. He verbalized understanding. Bright red blood noted in stool. MD suspects it is secondary to hemorrhoids. Will closely follow.  VAD home equipment has arrived. Discharge education completed with pt on 03/19/23.   Discharge tentatively planned for 10/1. Spoke with Suzette Battiest to confirm plans to stay with pt at his home for 2 weeks. She states she is unable to come to the hospital today and is comfortable with power source changes. VAD Coordinator will review again upon discharge with pt and Veronica at bedside.  Vital signs: Temp: 98.6 HR: 80 paced Doppler Pressure: 82 Auto BP: 97/70 (79) O2 Sat: 100% on RA Wt: 155.6>169.5>162>153.4>159.3>153.4>151.9>148.8>149.7>150.1>150.3>154.5 lbs    LVAD interrogation reveals:  Speed: 5400 Flow: 4.3 Power: 4.0 w  PI: 3.1  Alarms: none  Events: >60 PI events Hematocrit: 29  Fixed speed: 5500 Low speed limit: 5200   Drive Line: Existing VAD dressing removed and site care performed using sterile technique. Drive line exit site cleaned with Chlora prep applicators x 2, allowed to dry, and gauze dressing with Silver strip applied. There is a single suture present. The driveline has been pulled out approx 1". Exit site unincorporated, the velour is now exposed approx 1" at exit site. Scant dried bloody drainage, no redness, tenderness, foul odor or rash noted. Drive line  anchor re-applied. Pt denies fever or chills. Advance to twice weekly dressing changes on Monday & Thursday. Next dressing change due 03/29/23 by nurse champion or VAD coordinator.     Labs:  LDH trend: 234>310>274>302>318>306>285>310>386>270>202>233  INR trend: 1.6>1.5>1.6>1.5>1.7>2.3>2.6>2.3>2.5>2.4>1.8>1.9  Anticoagulation Plan: -INR Goal: 2-2.5 -ASA Dose: 81 mg   Blood Products:  -Intra op: 03/08/23 1 PRBC 2 FFP 1 PLT 1 Cryo 450 cell saver  Post op: 03/08/23 1 PRBC 2 FFP 1 PLT 1 Cryo  03/09/23: 1 PRBC 03/13/23: 1 PRBC  Device: -Medtronic -Therapies: ON  -VF > 200BMP  Arrythmias: hx afib- currently paced 80  Respiratory: on RA  Infection:   Renal:  -CRT: 1.28>1.72>1.76>1.63>1.37>1.17>1.12>1.11>1.21  Patient Education: All discharge education completed at bedside 03/19/23 with pt. Please see separate note for details.  Adverse Events on VAD:  Plan/Recommendations:  1. Please page VAD coordinator with any equipment issues or driveline problems. 2. Continue twice weekly dressing changes on Monday and Thursday using silver strip by VAD coordinator or nurse champion. Next dressing due 03/26/23.  Simmie Davies RN,BSN VAD Coordinator  Office: [phone]  24/7 Pager: [phone]

## 2023-03-26 NOTE — Progress Notes (Addendum)
ANTICOAGULATION CONSULT NOTE - Follow-up  Pharmacy Consult for warfarin management Indication:  LVAD  No Known Allergies  Patient Measurements: Height: 5\' 9"  (175.3 cm) Weight: 70.1 kg (154 lb 8.7 oz) IBW/kg (Calculated) : 70.7 Heparin Dosing Weight: 73.5 kg  Vital Signs: Temp: 98.5 F (36.9 C) (09/30 0819) Temp Source: Oral (09/30 0819) BP: 84/72 (09/30 0819) Pulse Rate: 60 (09/30 0819)  Labs: Recent Labs    03/24/23 0402 03/25/23 0355 03/26/23 0450  HGB 8.2* 7.9* 7.8*  HCT 25.9* 24.8* 24.7*  PLT 451* 421* 419*  LABPROT 22.6* 22.0* 22.1*  INR 2.0* 1.9* 1.9*  CREATININE 1.06  --  1.21    Estimated Creatinine Clearance: 57.9 mL/min (by C-G formula based on SCr of 1.21 mg/dL).   Medical History: Past Medical History:  Diagnosis Date   Acute MI, anterior wall (HCC)    AICD (automatic cardioverter/defibrillator) present    CAD (coronary artery disease)    2D ECHO, 07/13/2011 - EF <25%, LV moderatelty dilated, LA moderately dilatedLEXISCAN, 12/14/2011 - moderate-severe perfusion defect seen in the basal anteroseptal, mid anterior, apicacl anterior, apical, apical inferior, and apical lateral regions, post-stress EF 25%, new EKG changes from baseline abnormalities   Cancer (HCC)    Prostate   CHF (congestive heart failure) (HCC) 2012   Hypertension 08/08/2021   Inguinal hernia, left    Pneumonia    November 2023   Pre-diabetes     Medications:  Medications Prior to Admission  Medication Sig Dispense Refill Last Dose   acetaminophen (TYLENOL) 500 MG tablet Take 1,000 mg by mouth every 6 (six) hours as needed for mild pain.      amiodarone (PACERONE) 200 MG tablet Take 1 tablet (200 mg total) by mouth 2 (two) times daily.      aspirin EC 81 MG tablet Take 1 tablet (81 mg total) by mouth daily. Swallow whole.      atorvastatin (LIPITOR) 80 MG tablet Take 1 tablet (80 mg total) by mouth daily. 30 tablet 3    bisacodyl (DULCOLAX) 5 MG EC tablet Take 2 tablets (10 mg  total) by mouth daily.      [EXPIRED] cefTRIAXone 2 g in sodium chloride 0.9 % 100 mL Inject 2 g into the vein daily for 1 day.      Cholecalciferol (VITAMIN D) 125 MCG (5000 UT) CAPS Take 5,000 Units by mouth daily.      docusate sodium (COLACE) 100 MG capsule Take 2 capsules (200 mg total) by mouth daily.      finasteride (PROSCAR) 5 MG tablet Take 1 tablet (5 mg total) by mouth daily.      insulin aspart (NOVOLOG) 100 UNIT/ML injection Inject 0-24 Units into the skin 3 (three) times daily with meals.      losartan (COZAAR) 25 MG tablet Take 1 tablet (25 mg total) by mouth daily.      Multiple Vitamin (MULTIVITAMIN) tablet Take 1 tablet by mouth daily.      pantoprazole (PROTONIX) 40 MG tablet Take 1 tablet (40 mg total) by mouth daily.      spironolactone (ALDACTONE) 25 MG tablet Take 0.5 tablets (12.5 mg total) by mouth daily.      tamsulosin (FLOMAX) 0.4 MG CAPS capsule Take 1 capsule (0.4 mg total) by mouth daily after supper.      torsemide (DEMADEX) 20 MG tablet Take 1 tablet (20 mg total) by mouth daily as needed. Take 1 tablet by mouth every other day.      traMADol Janean Sark)  50 MG tablet Take 1-2 tablets (50-100 mg total) by mouth every 4 (four) hours as needed for moderate pain.      traZODone (DESYREL) 50 MG tablet Take 1 tablet (50 mg total) by mouth at bedtime as needed for sleep.      warfarin (COUMADIN) 1 MG tablet Take 1 tablet (1 mg total) by mouth one time only at 4 PM.      Scheduled:   amiodarone  200 mg Oral BID   atorvastatin  80 mg Oral Daily   Chlorhexidine Gluconate Cloth  6 each Topical BID   docusate sodium  200 mg Oral Daily   feeding supplement  237 mL Oral Q24H   finasteride  5 mg Oral Daily   insulin aspart  0-24 Units Subcutaneous TID WC   multivitamin with minerals  1 tablet Oral Daily   pantoprazole  40 mg Oral Daily   polyethylene glycol  17 g Oral Daily   sodium chloride flush  10-40 mL Intracatheter Q12H   tamsulosin  0.4 mg Oral QPC supper    Warfarin - Pharmacist Dosing Inpatient   Does not apply q1600   Infusions:   sodium chloride Stopped (03/20/23 2025)   PRN: sodium chloride, oxyCODONE, phenylephrine, traMADol, traZODone  Assessment: Pt presented to [facility] ED on 02/26/2023 with shortness of breath and SpO2 of 78 on room air. Pt has PMH of ICM (LAD infarct), HFrEF (EF <20%), LVT, VT s/p ICD, HTN, CKD3, HLD Pt underwent HMIII implantation on 9/12. Warfarin was initiated 9/15.   INR today is therapeutic at 1.9. Hgb stable at 7.8, plt 419, LDH 233. No s/sx of bleeding. Oral intake 50-100%.  Goal of Therapy:  2.0-2.5 Monitor platelets by anticoagulation protocol: Yes   Plan:  Warfarin 2.5 mg PO x1 Daily INR, CBC, LDH  Thank you for allowing pharmacy to participate in this patient's care,  Sherron Monday, PharmD, BCCCP Clinical Pharmacist  Phone: [phone] 03/26/2023 10:15 AM  Please check AMION for all [facility] Pharmacy phone numbers After 10:00 PM, call Main Pharmacy [phone]

## 2023-03-26 NOTE — Progress Notes (Addendum)
Occupational Therapy Discharge Summary  Patient Details  Name: Steven [name] MRN: [id number] Date of Birth: [birth year]  Date of Discharge from OT service:March 26, 2023  Today's Date: 03/26/2023 OT Individual Time: 5784-6962 OT Individual Time Calculation (min): 50 min    Patient has met 9 of 9 long term goals due to improved activity tolerance, improved balance, ability to compensate for deficits, and improved awareness.  Patient to discharge at overall Supervision-Mod I level.  Patient's care partner is independent to provide the necessary physical assistance at discharge.    Reasons goals not met: Pt continues to require SUPERVISION-MIN A for LVAD part management.   Recommendation:  Patient will benefit from ongoing skilled OT services in outpatient setting to continue to advance functional skills in the area of BADL, iADL, Vocation, and Reduce care partner burden.  Equipment: BSC & TTB  Reasons for discharge: treatment goals met and discharge from hospital  Patient/family agrees with progress made and goals achieved: Yes  OT Discharge Precautions/Restrictions  Precautions Precautions: Sternal;Other (comment) Precaution Comments: LVAD Restrictions Weight Bearing Restrictions: No Other Position/Activity Restrictions: Sternal precautions only. Pain Pain Assessment Pain Scale: 0-10 Pain Score: 0-No pain ADL ADL Eating: Independent Where Assessed-Eating: Chair Grooming: Modified independent Where Assessed-Grooming: Sitting at sink Upper Body Bathing: Modified independent Where Assessed-Upper Body Bathing: Sitting at sink Lower Body Bathing: Modified independent Where Assessed-Lower Body Bathing: Sitting at sink Upper Body Dressing: Modified independent (Device) Where Assessed-Upper Body Dressing: Sitting at sink Lower Body Dressing: Modified independent Where Assessed-Lower Body Dressing: Sitting at sink Toileting: Modified independent Where Assessed-Toileting:  Teacher, adult education: Engineer, agricultural Method: Proofreader: Chiropractor Transfer: Modified independent Web designer Method: Ship broker: Insurance underwriter: Not assessed ADL Comments: Heavy cuing required for LVAD management due to newness of equipment. Vision Baseline Vision/History: 1 Wears glasses Patient Visual Report: No change from baseline Vision Assessment?: No apparent visual deficits Perception  Perception: Within Functional Limits Praxis Praxis: WFL Cognition Cognition Overall Cognitive Status: Within Functional Limits for tasks assessed Arousal/Alertness: Awake/alert Orientation Level: Person;Place;Situation Memory: Appears intact Awareness: Appears intact Problem Solving: Appears intact Safety/Judgment: Appears intact Brief Interview for Mental Status (BIMS) Repetition of Three Words (First Attempt): 3 Temporal Orientation: Year: Correct Temporal Orientation: Month: Accurate within 5 days Temporal Orientation: Day: Correct Recall: "Sock": Yes, no cue required Recall: "Blue": Yes, no cue required Recall: "Bed": Yes, no cue required BIMS Summary Score: 15 Sensation Sensation Light Touch: Appears Intact Hot/Cold: Appears Intact Proprioception: Appears Intact Stereognosis: Appears Intact Coordination Gross Motor Movements are Fluid and Coordinated: Yes Fine Motor Movements are Fluid and Coordinated: Yes Heel Shin Test: in tact Motor  Motor Motor: Within Functional Limits Mobility  Transfers Sit to Stand: Independent with assistive device Stand to Sit: Independent with assistive device  Trunk/Postural Assessment  Cervical Assessment Cervical Assessment: Within Functional Limits Thoracic Assessment Thoracic Assessment: Within Functional Limits Lumbar Assessment Lumbar Assessment: Within Functional Limits Postural Control Postural Control: Within  Functional Limits  Balance Balance Balance Assessed: Yes Standardized Balance Assessment Standardized Balance Assessment: Berg Balance Test;Functional Gait Assessment Berg Balance Test Sit to Stand: Able to stand without using hands and stabilize independently Standing Unsupported: Able to stand safely 2 minutes Sitting with Back Unsupported but Feet Supported on Floor or Stool: Able to sit safely and securely 2 minutes Stand to Sit: Sits safely with minimal use of hands Transfers: Able to transfer safely, minor use of hands Standing Unsupported with Eyes Closed:  Able to stand 10 seconds safely Standing Ubsupported with Feet Together: Able to place feet together independently and stand 1 minute safely From Standing, Reach Forward with Outstretched Arm: Loses balance while trying/requires external support (unable to attempt 2/2 sternal precautions) From Standing Position, Pick up Object from Floor: Able to pick up shoe safely and easily From Standing Position, Turn to Look Behind Over each Shoulder: Looks behind from both sides and weight shifts well Turn 360 Degrees: Able to turn 360 degrees safely in 4 seconds or less Standing Unsupported, Alternately Place Feet on Step/Stool: Able to stand independently and safely and complete 8 steps in 20 seconds Standing Unsupported, One Foot in Front: Able to plae foot ahead of the other independently and hold 30 seconds Standing on One Leg: Able to lift leg independently and hold > 10 seconds Total Score: 51 Static Sitting Balance Static Sitting - Balance Support: Feet supported Static Sitting - Level of Assistance: 7: Independent Dynamic Sitting Balance Dynamic Sitting - Balance Support: During functional activity Dynamic Sitting - Level of Assistance: 6: Modified independent (Device/Increase time) Dynamic Sitting - Balance Activities: Lateral lean/weight shifting;Forward lean/weight shifting;Reaching for objects;Reaching across midline Static  Standing Balance Static Standing - Balance Support: During functional activity;No upper extremity supported Static Standing - Level of Assistance: 6: Modified independent (Device/Increase time) Dynamic Standing Balance Dynamic Standing - Balance Support: During functional activity;No upper extremity supported Dynamic Standing - Level of Assistance: 6: Modified independent (Device/Increase time) Dynamic Standing - Balance Activities: Lateral lean/weight shifting;Forward lean/weight shifting;Reaching for objects Functional Gait  Assessment Gait Level Surface: Walks 20 ft in less than 7 sec but greater than 5.5 sec, uses assistive device, slower speed, mild gait deviations, or deviates 6-10 in outside of the 12 in walkway width. Change in Gait Speed: Able to smoothly change walking speed without loss of balance or gait deviation. Deviate no more than 6 in outside of the 12 in walkway width. Gait with Horizontal Head Turns: Performs head turns with moderate changes in gait velocity, slows down, deviates 10-15 in outside 12 in walkway width but recovers, can continue to walk. Gait with Vertical Head Turns: Performs task with slight change in gait velocity (eg, minor disruption to smooth gait path), deviates 6 - 10 in outside 12 in walkway width or uses assistive device Gait and Pivot Turn: Pivot turns safely in greater than 3 sec and stops with no loss of balance, or pivot turns safely within 3 sec and stops with mild imbalance, requires small steps to catch balance. Step Over Obstacle: Is able to step over one shoe box (4.5 in total height) without changing gait speed. No evidence of imbalance. Gait with Narrow Base of Support: Is able to ambulate for 10 steps heel to toe with no staggering. Gait with Eyes Closed: Walks 20 ft, slow speed, abnormal gait pattern, evidence for imbalance, deviates 10-15 in outside 12 in walkway width. Requires more than 9 sec to ambulate 20 ft. Ambulating Backwards: Walks 20  ft, uses assistive device, slower speed, mild gait deviations, deviates 6-10 in outside 12 in walkway width. Steps: Alternating feet, must use rail. Total Score: 20 Extremity/Trunk Assessment RUE Assessment RUE Assessment: Within Functional Limits LUE Assessment LUE Assessment: Within Functional Limits  Skilled Intervention: Pt received sitting in recliner for skilled OT session with focus on discharge planning. Pt agreeable to interventions, despite demonstrating overall flat mood/affect. Pt with no reports of pain. OT offering intermediate rest breaks and positioning suggestions throughout session to address potential pain/fatigue  and maximize participation/safety in session.   Pt manages LVAD power switch (from wall to batteries) with moderate levels of cuing for safe technique, including making sure both batteries were prepped before disconnecting from wall power source, donning battery carrier, and un-entanglement of lines.   Pt ambulates household level distance to/from ADL apartment with supervision + no AD, completing TTB transfer with same level of assistance. All questions answered at end of session in preparation for discharge.   Pt remained sitting in recliner with all immediate needs met at end of session. Pt continues to be appropriate for skilled OT intervention to promote further functional independence.   Lou Cal, OTR/L, MSOT  03/26/2023, 11:48 AM

## 2023-03-26 NOTE — Progress Notes (Signed)
Advanced Heart Failure VAD Team Note  PCP-Cardiologist: Nicki Guadalajara, MD  AHF: Dr. Gala Romney   Subjective:    9/12: s/p HM3 LVAD 9/24: discharged to CIR  9/25: Innumerable PIs on VAD. Suspected to be dry. Stopped spiro and losartan. Given LR bolus 500 cc  Feels well today. No complaints. Ambulating w/ PT. Ambulating w/o difficulty. Denies dyspnea. No CP.   Hgb 7.9>>7.8. INR 1.9 today.   LVAD INTERROGATION:  HeartMate III LVAD:   Flow 4.5 liters/min, speed 5500, power 4, PI 3.1.   Objective:    Vital Signs:   Temp:  [97.8 F (36.6 C)-98.9 F (37.2 C)] 98.5 F (36.9 C) (09/30 0819) Pulse Rate:  [55-82] 60 (09/30 0819) Resp:  [17-18] 18 (09/30 0819) BP: (84-104)/(53-88) 84/72 (09/30 0819) SpO2:  [100 %] 100 % (09/30 0819) Last BM Date : 03/25/23 Mean arterial Pressure MAP 78  Intake/Output:   Intake/Output Summary (Last 24 hours) at 03/26/2023 1028 Last data filed at 03/26/2023 0746 Gross per 24 hour  Intake 897 ml  Output 550 ml  Net 347 ml     Physical Exam   GENERAL: ambulating w/o difficulty. No acute distress.  HEENT: normal  NECK: Supple,  2+ bilaterally, no bruits.  No lymphadenopathy or thyromegaly appreciated.   CARDIAC:  Mechanical heart sounds with LVAD hum present.  LUNGS:  Clear to auscultation bilaterally. No wheezing  ABDOMEN:  Soft, round, nontender, positive bowel sounds x4.     LVAD exit site: Dressing dry and intact.  No erythema or drainage.  Stabilization device present and accurately applied.   EXTREMITIES:  Warm and dry, no cyanosis, clubbing, rash or edema  NEUROLOGIC:  Alert and oriented x 3.    No aphasia.  No dysarthria.  Affect flat.    EKG    N/A   Labs   Basic Metabolic Panel: Recent Labs  Lab 03/20/23 0530 03/21/23 0448 03/23/23 0339 03/24/23 0402 03/25/23 0355 03/26/23 0450  NA 128* 131*  --  130*  --  131*  K 4.0 4.2  --  4.3  --  3.9  CL 98 99  --  98  --  100  CO2 21* 21*  --  22  --  25  GLUCOSE 109* 80  --  95   --  88  BUN 20 19  --  21  --  15  CREATININE 1.12 1.11  --  1.06  --  1.21  CALCIUM 7.9* 8.5*  --  8.4*  --  8.3*  MG 2.0  --  2.0 1.9 2.1 1.9    Liver Function Tests: Recent Labs  Lab 03/21/23 0448  AST 112*  ALT 131*  ALKPHOS 82  BILITOT 1.1  PROT 6.2*  ALBUMIN 2.5*   No results for input(s): "LIPASE", "AMYLASE" in the last 168 hours. No results for input(s): "AMMONIA" in the last 168 hours.  CBC: Recent Labs  Lab 03/21/23 0448 03/23/23 0339 03/24/23 0402 03/25/23 0355 03/26/23 0450  WBC 15.4* 12.9* 9.2 10.2 9.0  NEUTROABS 13.0*  --   --   --   --   HGB 9.2* 9.0* 8.2* 7.9* 7.8*  HCT 29.0* 28.0* 25.9* 24.8* 24.7*  MCV 93.9 94.9 93.5 95.4 93.9  PLT 528* 503* 451* 421* 419*    INR: Recent Labs  Lab 03/22/23 0323 03/23/23 0339 03/24/23 0402 03/25/23 0355 03/26/23 0450  INR 2.4* 2.1* 2.0* 1.9* 1.9*    Other results: EKG:    Imaging   No results found.  Medications:     Scheduled Medications:  amiodarone  200 mg Oral BID   atorvastatin  80 mg Oral Daily   Chlorhexidine Gluconate Cloth  6 each Topical BID   docusate sodium  200 mg Oral Daily   feeding supplement  237 mL Oral Q24H   finasteride  5 mg Oral Daily   insulin aspart  0-24 Units Subcutaneous TID WC   multivitamin with minerals  1 tablet Oral Daily   pantoprazole  40 mg Oral Daily   polyethylene glycol  17 g Oral Daily   potassium chloride  20 mEq Oral Once   sodium chloride flush  10-40 mL Intracatheter Q12H   tamsulosin  0.4 mg Oral QPC supper   Warfarin - Pharmacist Dosing Inpatient   Does not apply q1600    Infusions:  sodium chloride Stopped (03/20/23 2025)    PRN Medications: sodium chloride, oxyCODONE, phenylephrine, traMADol, traZODone   Patient Profile   67 y/o male w/ chronic systolic heart failure due to ICM 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 Now s/p HM3 LVAD 9/12. Discharged to CIR on 9/24    Assessment/Plan:    1. Acute on chronic combined  systolic and diastolic heart failure>>Low Output S/p HM3 LVAD on 9/12.  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 NYHA IV on admission.  Echo 02/27/23: EF <20%, LV with GHK, RV mildly reduced, GIIDD, LA mod dilated, mild MR.  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 GDMT limited by renal function and hypotension.  S/P HM3 LVAD implant 9/12.  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 Intra-op TEE LVEF 15%, RV normal.  - Ramp ECHO 03/13/23. Speed increased to 5500. Pain controlled with tylenol.  - MAP 80s. Euvolemic on exam - Off GDMT w/ soft BPs  - LDH stable - INR 1.9  Discussed dosing with PharmD personally. - Bright red blood in stool yesterday. slight decrease in Hgb from 8.2>>7.9. ASA discontinued. Hgb stable today at 7.8. Will monitor. - euvolemic.  Continue torsemide PRN   2. 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  -  No chest pain.  - on ASA/statin   3. AKI on CKD stage 3: Creatinine baseline 1.6-1.8 - cardiorenal, resolved w/ LVAD support - avoid hypotension. Holding GDMT per above  - sCr stable.   4. Mural thrombus: on Warfarin.  - INR 1.9  5. 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 Anemia: Post-op. S/p 1UPRBCs.  - Hgb 7.8 - monitor   7. Atrial fibrillation:  - continue amio 200 mg twice a day   - On coumadin. INR 1.9  8. Hematuria:  - Urology consulted. Started on proscar.  - Suspect related to trauma with foley insertion.  - resolved, Hgb stable. Continue to follow.   9. UTI  - Completed  antibiotic course.   - WBCs  down trending.   10. Deconditioning:  - CIR. Appreciate their assistance  - anticipate d/c home tomorrow    I reviewed the LVAD parameters from today, and compared the results to the patient's prior recorded data.  No programming changes were made.  The LVAD is functioning within specified parameters.  The patient  performs LVAD self-test daily.  LVAD interrogation was negative for any significant power changes, alarms or PI events/speed drops.  LVAD equipment check completed and is in good working order.  Back-up equipment present.  LVAD education done on emergency procedures and precautions and reviewed exit site care.  Length of Stay: [address], [place] 03/26/2023, 10:28 AM  VAD Team --- VAD ISSUES ONLY--- Pager [phone] (7am - 7am)  Advanced Heart Failure Team  Pager [phone] (M-F; 7a - 5p)  Please contact [facility] for night-coverage after hours (5p -7a ) and weekends on [email or web address]

## 2023-03-26 NOTE — Progress Notes (Signed)
Inpatient Rehabilitation Care Coordinator Discharge Note   Patient Details  Name: Steven [name] MRN: [id number] Date of Birth: 02-22-1956   Discharge location: HOME WITH DAUGHTER'S MOM-VERONICA 24/7 SUPERVISION FOR SHORT TIME  Length of Stay: 7 DAYS  Discharge activity level: MOD/I-SUPERVISION LEVEL  Home/community participation: ACTIVE  Patient response UY:QIHKVQ Literacy - How often do you need to have someone help you when you read instructions, pamphlets, or other written material from your doctor or pharmacy?: Never  Patient response QV:ZDGLOV Isolation - How often do you feel lonely or isolated from those around you?: Rarely  Services provided included: MD, RD, PT, OT, RN, CM, Pharmacy, Neuropsych, SW  Financial Services:  Field seismologist Utilized: Private Insurance [employer]-MED[employer]ARE DUAL COMPLETE  Choices offered to/list presented to: PT  Follow-up services arranged:  DME, Home Health, Patient/Family has no preference for HH/DME agencies Home Health Agency: [facility]  PT  OT  RN    DME : [facility]  3 IN 1 AND TUB BENCH    Patient response to transportation need: Is the patient able to respond to transportation needs?: Yes In the past 12 months, has lack of transportation kept you from medical appointments or from getting medications?: No In the past 12 months, has lack of transportation kept you from meetings, work, or from getting things needed for daily living?: No   Patient/Family verbalized understanding of follow-up arrangements:  Yes  Individual responsible for coordination of the follow-up plan: SELF AND [phone]  Confirmed correct DME delivered: Lucy Chris 03/26/2023    Comments (or additional information):PT DID WELL BUT DID NEED SUPERVISION FOR LVAD INSTRUCTIONS. VERONICA DID DO EDUCATION AND FEELS COMFORTABLE WITH THIS.  Summary of Stay    Date/Time Discharge Planning CSW  03/21/23 [phone] new evaluation according to  pt his daughter's Mom will be there with him a short time. He wants to be mod/i by discharge. RGD       Lucy Chris

## 2023-03-26 NOTE — Progress Notes (Addendum)
PROGRESS NOTE   Subjective/Complaints:  No new concerns this morning.  He did have a bowel movement with blood noted on it on Saturday.  Bowel movement on Sunday documented as brown.  ROS: Patient denies fever, rash, sore throat, blurred vision, dizziness, nausea, vomiting, diarrhea, cough,  or chest pain, joint or back/neck pain, headache Baseline shortness of breath with activity  Objective:   No results found. Recent Labs    03/25/23 0355 03/26/23 0450  WBC 10.2 9.0  HGB 7.9* 7.8*  HCT 24.8* 24.7*  PLT 421* 419*   Recent Labs    03/24/23 0402 03/26/23 0450  NA 130* 131*  K 4.3 3.9  CL 98 100  CO2 22 25  GLUCOSE 95 88  BUN 21 15  CREATININE 1.06 1.21  CALCIUM 8.4* 8.3*    Intake/Output Summary (Last 24 hours) at 03/26/2023 1646 Last data filed at 03/26/2023 1251 Gross per 24 hour  Intake 1135 ml  Output 1100 ml  Net 35 ml        Physical Exam: Vital Signs Blood pressure 97/70, pulse 82, temperature 98.6 F (37 C), resp. rate 16, height 5\' 9"  (1.753 m), weight 70.1 kg, SpO2 100%.  General: No acute distress Mood and affect are appropriate Heart: Regular rate and rhythm no rubs murmurs or extra sounds Lungs: Clear to auscultation, breathing unlabored, no rales or wheezes Abdomen: Positive bowel sounds, soft nontender to palpation, nondistended.  LVAD site with dry dressing intact Extremities: No clubbing, cyanosis, or edema Skin: No evidence of breakdown, no evidence of rash  Neuro: Alert and awake, lying in bed.  Follows commands.  Oriented x 3. Normal language, speech. Functional memory and awareness. UE motor 4/5. LE motor 3+/5 HF, KE 4/5, ADF/PF 4+/5. No sensory deficits appreciated. Normal muscle tone.  Cranial nerves II through XII grossly intact. Musculoskeletal: No joint swelling noted   Assessment/Plan: 1. Functional deficits which require 3+ hours per day of interdisciplinary therapy in a  comprehensive inpatient rehab setting. Physiatrist is providing close team supervision and 24 hour management of active medical problems listed below. Physiatrist and rehab team continue to assess barriers to discharge/monitor patient progress toward functional and medical goals  Care Tool:  Bathing    Body parts bathed by patient: Right arm, Left arm, Chest, Abdomen, Right upper leg, Front perineal area, Buttocks, Left upper leg, Right lower leg, Face, Left lower leg   Body parts bathed by helper: Right lower leg, Left lower leg     Bathing assist Assist Level: Independent with assistive device     Upper Body Dressing/Undressing Upper body dressing   What is the patient wearing?: Pull over shirt    Upper body assist Assist Level: Independent    Lower Body Dressing/Undressing Lower body dressing      What is the patient wearing?: Underwear/pull up, Pants     Lower body assist Assist for lower body dressing: Independent with assitive device     Toileting Toileting    Toileting assist Assist for toileting: Independent with assistive device     Transfers Chair/bed transfer  Transfers assist     Chair/bed transfer assist level: Independent     Locomotion  Ambulation   Ambulation assist      Assist level: Independent Assistive device: No Device Max distance: 300+   Walk 10 feet activity   Assist     Assist level: Independent Assistive device: No Device   Walk 50 feet activity   Assist    Assist level: Independent Assistive device: No Device    Walk 150 feet activity   Assist    Assist level: Independent Assistive device: No Device    Walk 10 feet on uneven surface  activity   Assist Walk 10 feet on uneven surfaces activity did not occur: Safety/medical concerns   Assist level: Independent Assistive device: Other (comment) (none)   Wheelchair     Assist Is the patient using a wheelchair?: No             Wheelchair  50 feet with 2 turns activity    Assist            Wheelchair 150 feet activity     Assist          Blood pressure 97/70, pulse 82, temperature 98.6 F (37 C), resp. rate 16, height 5\' 9"  (1.753 m), weight 70.1 kg, SpO2 100%.  Medical Problem List and Plan: 1. Functional deficits secondary to debility after heart failure/ LVAD implant 9/12.   -Sternal precautions             -patient may not yet shower             -ELOS/Goals: 7-10 days, mod I goals with PT/OT  -Continue CIR  -Estimated discharge 10/1  -Continue education regarding LVAD  --Discharge home tomorrow 2.  Antithrombotics: -DVT/anticoagulation:  Pharmaceutical: Coumadin             -antiplatelet therapy: Aspirin 81 mg daily 3. Pain Management: Oxycodone/tramadol as needed 4. Mood/Behavior/Sleep: N/A             -antipsychotic agents: N/A 5. Neuropsych/cognition: This patient is capable of making decisions on his own behalf.  -Seen by neuropsychology for adjustment disorder 6. Skin/Wound Care: Routine skin checks             -local care to drive site  7. Fluids/Electrolytes/Nutrition: encourage PO             -Hyponatremia, 9/25 stable at 131 continue to monitor  -BMP every 24 hours, sodium 131 9/30 8.  Acute on chronic combined systolic and diastolic congestive heart failure.  Follow-up per heart failure team.  Spironolactone and losartan number discontinued by heart failure team             -daily weights  -9/30 no signs of fluid overload noted  Filed Weights   03/23/23 1056 03/24/23 0500 03/25/23 0547  Weight: 68.1 kg 70.8 kg 70.1 kg    9.  Acute blood loss anemia.  Follow-up CBC  -9/ 30 hemoglobin overall stable at 7.8    Latest Ref Rng & Units 03/26/2023    4:50 AM 03/25/2023    3:55 AM 03/24/2023    4:02 AM  CBC  WBC 4.0 - 10.5 K/uL 9.0  10.2  9.2   Hemoglobin 13.0 - 17.0 g/dL 7.8  7.9  8.2   Hematocrit 39.0 - 52.0 % 24.7  24.8  25.9   Platelets 150 - 400 K/uL 419  421  451    May be  related to GI blood loss, have notified cardiology who will eval  , pt needs to be on warfarin to reduce thrombosis risk post  LVAD , Afib hx as well.  Abd exam ok.  Defer to cardiology regarding need for GI involvement . Cont daily CBC 10.  Hematuria/UTI/persistent leukocytosis.  Placed on Proscar per urology services.  Complete empiric course of Rocephin.  -9/25 WBC down to 15.4. 11.  Hyperlipidemia.  Lipitor 12.  Prediabetes.  Hemoglobin A1c 6.6.  SSI  -9/26 CBGs well-controlled, continue to monitor for now 13.  CKD stage III a?  Follow-up chemistries             -9/ 30 creatinine stable at 1.21/BUN 15    Latest Ref Rng & Units 03/26/2023    4:50 AM 03/24/2023    4:02 AM 03/21/2023    4:48 AM  BMP  Glucose 70 - 99 mg/dL 88  95  80   BUN 8 - 23 mg/dL 15  21  19    Creatinine 0.61 - 1.24 mg/dL 6.57  8.46  9.62   Sodium 135 - 145 mmol/L 131  130  131   Potassium 3.5 - 5.1 mmol/L 3.9  4.3  4.2   Chloride 98 - 111 mmol/L 100  98  99   CO2 22 - 32 mmol/L 25  22  21    Calcium 8.9 - 10.3 mg/dL 8.3  8.4  8.5     14.  Atrial fibrillation.  Amiodarone 200 mg twice daily.  Continue Coumadin  -9/26 INR 2.4  9/27 heart rate stable, INR 2.1     03/26/2023   11:52 AM 03/26/2023    8:19 AM 03/26/2023    4:20 AM  Vitals with BMI  Systolic 97 84 90  Diastolic 70 72 53  Pulse 82 60 55    15.  Transaminitis.   -Suspect cardiac related.  Stable from prior.  Avoid hepatotoxic medications if possible.  16.  Bright red blood in stool.  -ASA was discontinued by cardiology.  Suspected to be hemorrhoid related.   LOS: 6 days A FACE TO FACE EVALUATION WAS PERFORMED  Fanny Dance 03/26/2023, 4:46 PM

## 2023-03-26 NOTE — Progress Notes (Signed)
Occupational Therapy Session Note  Patient Details  Name: Steven [name] MRN: [id number] Date of Birth: January 06, 1956  Today's Date: 03/26/2023 OT Individual Time: 0850-0930 OT Individual Time Calculation (min): 40 min    Short Term Goals: Week 1:  OT Short Term Goal 1 (Week 1): STG's=LTGs d/t LOS  Skilled Therapeutic Interventions/Progress Updates:    Pt greeted sated in recliner and agreeable to OT treatment session focused on increased independence with self-care tasks. Pt ambulated to the sink w/ supervision. Pt completed UB bathing/dressing mod I. He then reported need to go to the bathroom. Supervision ambulation w/o device to transition onto commode. Pt with successful bowel and bladder. Pt completed peri-care mod I. LB bathing/dressing tasks at the sink without OT assist. Pt left seated in recliner with alarm on, call bell in reach, and needs met.   Therapy Documentation Precautions:  Precautions Precautions: Sternal, Other (comment) Restrictions Weight Bearing Restrictions: Yes RUE Weight Bearing: Non weight bearing LUE Weight Bearing: Non weight bearing Other Position/Activity Restrictions: sternal precautions only Pain: Pain Assessment Pain Scale: 0-10 Pain Score: 0-No pain  Therapy/Group: Individual Therapy  Mal Amabile 03/26/2023, 9:30 AM

## 2023-03-26 NOTE — Progress Notes (Signed)
Physical Therapy Discharge Summary  Patient Details  Name: Steven [name] MRN: [id number] Date of Birth: [birth year]  Date of Discharge from PT service:March 26, 2023  Today's Date: 03/26/2023 PT Individual Time: 4782-9562, 1308-6578 PT Individual Time Calculation (min): 62 min, 44 min     Patient has met 9 of 9 long term goals due to improved activity tolerance, improved balance, increased strength, decreased pain, ability to compensate for deficits, improved attention, improved awareness, and improved coordination.  Patient to discharge at an ambulatory level.  Patient's care partner is independent to provide the necessary physical assistance at discharge for LVAD management as pt requires 24/7 supervision/min A for sequencing and recall of steps.   Recommendation:  Patient will benefit from ongoing skilled PT services in home health setting to continue to advance safe functional mobility, perform home assessment for safety address ongoing impairments in dynamic balance, global strength and endurance and minimize fall risk.  Equipment: No equipment provided  Reasons for discharge: treatment goals met and discharge from hospital  Patient/family agrees with progress made and goals achieved: Yes  PT Discharge Precautions/Restrictions Precautions Precautions: Sternal;Other (comment) Precaution Comments: LVAD Restrictions Weight Bearing Restrictions: No Other Position/Activity Restrictions: Sternal precautions only. Pain Interference Pain Interference Pain Effect on Sleep: 2. Occasionally Pain Interference with Therapy Activities: 1. Rarely or not at all Pain Interference with Day-to-Day Activities: 1. Rarely or not at all Vision/Perception  Vision - History Ability to See in Adequate Light: 0 Adequate Perception Perception: Within Functional Limits Praxis Praxis: WFL  Cognition Overall Cognitive Status: Within Functional Limits for tasks assessed Arousal/Alertness:  Awake/alert Orientation Level: Oriented X4 Memory: Impaired (pt requires supervision/min A with LVAD for sequencing and recall of steps) Awareness: Impaired (pt requires supervision/min A with LVAD for sequencing and recall of steps) Problem Solving: Appears intact Safety/Judgment: Impaired (pt requires supervision/min A with LVAD for sequencing and recall of steps) Sensation Sensation Light Touch: Appears Intact Hot/Cold: Appears Intact Proprioception: Appears Intact Stereognosis: Appears Intact Coordination Gross Motor Movements are Fluid and Coordinated: Yes Fine Motor Movements are Fluid and Coordinated: Yes Finger Nose Finger Test: [facility] Heel Shin Test: [facility] Motor  Motor Motor: Within Functional Limits  Mobility Bed Mobility Bed Mobility: Supine to Sit;Sit to Supine;Rolling Right;Rolling Left Rolling Right: Independent Rolling Left: Independent Supine to Sit: Independent Sit to Supine: Independent Transfers Transfers: Sit to Stand;Stand to Sit;Stand Pivot Transfers Sit to Stand: Independent Stand to Sit: Independent Stand Pivot Transfers: Independent Transfer (Assistive device): None Locomotion  Gait Ambulation: Yes Gait Assistance: Independent Gait Distance (Feet): 300 Feet Assistive device: None Gait Gait: Yes Gait Pattern: Impaired Gait velocity: decreased Stairs / Additional Locomotion Stairs: Yes Stairs Assistance: Independent Stair Management Technique: One rail Right;Alternating pattern Number of Stairs: 12 Height of Stairs: 6 Wheelchair Mobility Wheelchair Mobility: No  Trunk/Postural Assessment  Cervical Assessment Cervical Assessment: Within Functional Limits Thoracic Assessment Thoracic Assessment: Within Functional Limits Lumbar Assessment Lumbar Assessment: Within Functional Limits Postural Control Postural Control: Within Functional Limits  Balance Balance Balance Assessed: Yes Standardized Balance Assessment Standardized Balance  Assessment: Berg Balance Test;Functional Gait Assessment Berg Balance Test Sit to Stand: Able to stand without using hands and stabilize independently Standing Unsupported: Able to stand safely 2 minutes Sitting with Back Unsupported but Feet Supported on Floor or Stool: Able to sit safely and securely 2 minutes Stand to Sit: Sits safely with minimal use of hands Transfers: Able to transfer safely, minor use of hands Standing Unsupported with Eyes Closed: Able to stand 10 seconds  safely Standing Ubsupported with Feet Together: Able to place feet together independently and stand 1 minute safely From Standing, Reach Forward with Outstretched Arm: Loses balance while trying/requires external support From Standing Position, Pick up Object from Floor: Able to pick up shoe safely and easily From Standing Position, Turn to Look Behind Over each Shoulder: Looks behind from both sides and weight shifts well Turn 360 Degrees: Able to turn 360 degrees safely in 4 seconds or less Standing Unsupported, Alternately Place Feet on Step/Stool: Able to stand independently and safely and complete 8 steps in 20 seconds Standing Unsupported, One Foot in Front: Able to plae foot ahead of the other independently and hold 30 seconds Standing on One Leg: Able to lift leg independently and hold > 10 seconds Total Score: 51 Static Sitting Balance Static Sitting - Balance Support: Feet supported Static Sitting - Level of Assistance: 7: Independent Dynamic Sitting Balance Dynamic Sitting - Balance Support: During functional activity Dynamic Sitting - Level of Assistance: 7: Independent Static Standing Balance Static Standing - Balance Support: During functional activity;No upper extremity supported Static Standing - Level of Assistance: 7: Independent Dynamic Standing Balance Dynamic Standing - Balance Support: During functional activity;No upper extremity supported Dynamic Standing - Level of Assistance: 7:  Independent Functional Gait  Assessment Gait assessed : Yes Gait Level Surface: Walks 20 ft in less than 7 sec but greater than 5.5 sec, uses assistive device, slower speed, mild gait deviations, or deviates 6-10 in outside of the 12 in walkway width. Change in Gait Speed: Able to smoothly change walking speed without loss of balance or gait deviation. Deviate no more than 6 in outside of the 12 in walkway width. Gait with Horizontal Head Turns: Performs head turns with moderate changes in gait velocity, slows down, deviates 10-15 in outside 12 in walkway width but recovers, can continue to walk. Gait with Vertical Head Turns: Performs task with slight change in gait velocity (eg, minor disruption to smooth gait path), deviates 6 - 10 in outside 12 in walkway width or uses assistive device Gait and Pivot Turn: Pivot turns safely in greater than 3 sec and stops with no loss of balance, or pivot turns safely within 3 sec and stops with mild imbalance, requires small steps to catch balance. Step Over Obstacle: Is able to step over one shoe box (4.5 in total height) without changing gait speed. No evidence of imbalance. Gait with Narrow Base of Support: Is able to ambulate for 10 steps heel to toe with no staggering. Gait with Eyes Closed: Walks 20 ft, slow speed, abnormal gait pattern, evidence for imbalance, deviates 10-15 in outside 12 in walkway width. Requires more than 9 sec to ambulate 20 ft. Ambulating Backwards: Walks 20 ft, uses assistive device, slower speed, mild gait deviations, deviates 6-10 in outside 12 in walkway width. Steps: Alternating feet, must use rail. Total Score: 20 Extremity Assessment  RLE Assessment RLE Assessment: Within Functional Limits LLE Assessment LLE Assessment: Within Functional Limits   TODAY'S INTERVENTIONS  Treatment Session 1   Patient demonstrates increased fall risk as noted by score of   51/56 on Berg Balance Scale.  (<36= high risk for falls, close  to 100%; 37-45 significant >80%; 46-51 moderate >50%; 52-55 lower >25%).   Patient demonstrates increased fall risk as noted by score of 20/30 on  Functional Gait Assessment.   <22/30 = predictive of falls, <20/30 = fall in 6 months, <18/30 = predictive of falls in PD MCID: 5 points stroke  population, 4 points geriatric population (ANPTA Core Set of Outcome Measures for Adults with Neurologic Conditions, 2018).   Pt able to recall and provide teach back of sternal precautions and methods to reduce fall risk.   Pt transitioned LVAD from wall to batteries, and batteries to wall with supervision/intermittent min A, requiring cues to bring back up bag to the gym, and donning holster correctly, and to not doff the clip from battery prior to plugging attaching the wire back to the wall.   Followed up with pt and interdisciplinary team regarding pt caregiver coming in for family training with LVAD coordinator as pt requires supervision/min A for sequencing and recall of steps.    Treatment Session 2   Pt supine in bed upon arrival. Pt agreeable to therapy. Pt denies any pain. LVAD coordinator communicated via secure chat that pt caregiver feels comfortable with LVAD training last week. LVAD coordinator plans to review again with her upon discharge.   Pt requesting to use bathroom, pt ambulated from bed to bathroom with min A for management of power cord. Educaiton provided to pt on importance of ensuring power cord does not get caught and pull on pt insertion of heart mate. Pt verbalized understanding and returned demonstration of cord management upon return from bathroom to recliner.    Reiterated to and emphasized the importance of pt that needing  assistance for LVAD management for safety.  Pt verbalized understanding and agreeable. Pt practiced transitioning power from wall to batteries, and batteries to wall with supervision, while therapist made a check list (one for wall to batteries and one for  batteries to wall) to ensure pt and caregiver are able to recall steps and sequence appropriately. Pt able to read all steps and perform with supervision and increased time.   Pt seated in recliner at end of session with all needs within reach and seatbelt alarm on.    [facility] [place], [place], DPT  03/26/2023, 4:08 PM

## 2023-03-27 ENCOUNTER — Encounter (HOSPITAL_COMMUNITY): Payer: Self-pay

## 2023-03-27 ENCOUNTER — Encounter (HOSPITAL_COMMUNITY): Payer: 59 | Admitting: Internal Medicine

## 2023-03-27 ENCOUNTER — Other Ambulatory Visit (HOSPITAL_COMMUNITY): Payer: Self-pay

## 2023-03-27 LAB — CBC
HCT: 25.7 % — ABNORMAL LOW (ref 39.0–52.0)
Hemoglobin: 8.2 g/dL — ABNORMAL LOW (ref 13.0–17.0)
MCH: 30.1 pg (ref 26.0–34.0)
MCHC: 31.9 g/dL (ref 30.0–36.0)
MCV: 94.5 fL (ref 80.0–100.0)
Platelets: 411 10*3/uL — ABNORMAL HIGH (ref 150–400)
RBC: 2.72 MIL/uL — ABNORMAL LOW (ref 4.22–5.81)
RDW: 14.7 % (ref 11.5–15.5)
WBC: 8 10*3/uL (ref 4.0–10.5)
nRBC: 0 % (ref 0.0–0.2)

## 2023-03-27 LAB — MAGNESIUM: Magnesium: 2.5 mg/dL — ABNORMAL HIGH (ref 1.7–2.4)

## 2023-03-27 LAB — GLUCOSE, CAPILLARY
Glucose-Capillary: 77 mg/dL (ref 70–99)
Glucose-Capillary: 156 mg/dL — ABNORMAL HIGH (ref 70–99)

## 2023-03-27 LAB — LACTATE DEHYDROGENASE: LDH: 242 U/L — ABNORMAL HIGH (ref 98–192)

## 2023-03-27 LAB — PROTIME-INR
INR: 1.8 — ABNORMAL HIGH (ref 0.8–1.2)
Prothrombin Time: 21.3 s — ABNORMAL HIGH (ref 11.4–15.2)

## 2023-03-27 MED ORDER — WARFARIN SODIUM 2.5 MG PO TABS: 5.0000 mg | ORAL_TABLET | Freq: Once | ORAL | Status: DC

## 2023-03-27 MED ORDER — WARFARIN SODIUM 2.5 MG PO TABS
ORAL_TABLET | ORAL | 0 refills | Status: DC
  Filled 2023-03-27: qty 50, fill #0
  Filled 2023-05-03 (×2): qty 50, 30d supply, fill #0

## 2023-03-27 MED ORDER — WARFARIN SODIUM 2.5 MG PO TABS
ORAL_TABLET | ORAL | 0 refills | Status: DC
  Filled 2023-03-27: qty 50, 38d supply, fill #0

## 2023-03-27 MED ORDER — OXYCODONE HCL 5 MG PO TABS
5.0000 mg | ORAL_TABLET | ORAL | 0 refills | Status: DC | PRN
  Filled 2023-03-27: qty 30, 5d supply, fill #0

## 2023-03-27 MED ORDER — AMIODARONE HCL 200 MG PO TABS
200.0000 mg | ORAL_TABLET | Freq: Two times a day (BID) | ORAL | 0 refills | Status: DC
  Filled 2023-03-27: qty 60, 30d supply, fill #0

## 2023-03-27 MED ORDER — POTASSIUM CHLORIDE CRYS ER 20 MEQ PO TBCR
20.0000 meq | EXTENDED_RELEASE_TABLET | Freq: Once | ORAL | Status: AC
  Administered 2023-03-27: 20 meq via ORAL
  Filled 2023-03-27: qty 1

## 2023-03-27 MED ORDER — TORSEMIDE 20 MG PO TABS
20.0000 mg | ORAL_TABLET | Freq: Once | ORAL | Status: AC
  Administered 2023-03-27: 20 mg via ORAL
  Filled 2023-03-27: qty 1

## 2023-03-27 MED ORDER — TORSEMIDE 20 MG PO TABS
20.0000 mg | ORAL_TABLET | Freq: Every day | ORAL | 0 refills | Status: DC | PRN
  Filled 2023-03-27: qty 30, 30d supply, fill #0

## 2023-03-27 NOTE — Progress Notes (Cosign Needed)
Advanced Heart Failure VAD Team Note  PCP-Cardiologist: Nicki Guadalajara, MD  AHF: Dr. Gala Romney   Subjective:    9/12: s/p HM3 LVAD 9/24: discharged to CIR  9/25: Innumerable PIs on VAD. Suspected to be dry. Stopped spiro and losartan. Given LR bolus 500 cc  Denies SOB. Wants to go home.   LVAD INTERROGATION:  HeartMate III LVAD:   Flow 4.6 liters/min, speed 5500, power 4, PI 2.7   Objective:    Vital Signs:   Temp:  [97.5 F (36.4 C)-98.6 F (37 C)] 97.5 F (36.4 C) (10/01 0424) Pulse Rate:  [60-82] 60 (10/01 0424) Resp:  [16-17] 17 (10/01 0424) BP: (89-113)/(56-87) 89/69 (10/01 0424) SpO2:  [99 %-100 %] 99 % (10/01 0424) Last BM Date : 03/25/23 Mean arterial Pressure MAP 70-80s   Intake/Output:   Intake/Output Summary (Last 24 hours) at 03/27/2023 0848 Last data filed at 03/27/2023 0543 Gross per 24 hour  Intake 474 ml  Output 2400 ml  Net -1926 ml     Physical Exam   Physical Exam: GENERAL: No acute distress. HEENT: normal  NECK: Supple, JVP  7-8  .  2+ bilaterally, no bruits.  No lymphadenopathy or thyromegaly appreciated.   CARDIAC:  Mechanical heart sounds with LVAD hum present.  LUNGS:  Clear to auscultation bilaterally.  ABDOMEN:  Soft, round, nontender, positive bowel sounds x4.     LVAD exit site:  Dressing dry and intact.  No erythema or drainage.  Stabilization device present and accurately applied.   EXTREMITIES:  Warm and dry, no cyanosis, clubbing, rash. R and LLE 1+  edema  NEUROLOGIC:  Alert and oriented x 3.    No aphasia.  No dysarthria.  Affect pleasant.     Labs   Basic Metabolic Panel: Recent Labs  Lab 03/21/23 0448 03/23/23 0339 03/24/23 0402 03/25/23 0355 03/26/23 0450 03/27/23 0351  NA 131*  --  130*  --  131*  --   K 4.2  --  4.3  --  3.9  --   CL 99  --  98  --  100  --   CO2 21*  --  22  --  25  --   GLUCOSE 80  --  95  --  88  --   BUN 19  --  21  --  15  --   CREATININE 1.11  --  1.06  --  1.21  --   CALCIUM 8.5*  --   8.4*  --  8.3*  --   MG  --  2.0 1.9 2.1 1.9 2.5*    Liver Function Tests: Recent Labs  Lab 03/21/23 0448  AST 112*  ALT 131*  ALKPHOS 82  BILITOT 1.1  PROT 6.2*  ALBUMIN 2.5*   No results for input(s): "LIPASE", "AMYLASE" in the last 168 hours. No results for input(s): "AMMONIA" in the last 168 hours.  CBC: Recent Labs  Lab 03/21/23 0448 03/23/23 0339 03/24/23 0402 03/25/23 0355 03/26/23 0450 03/27/23 0351  WBC 15.4* 12.9* 9.2 10.2 9.0 8.0  NEUTROABS 13.0*  --   --   --   --   --   HGB 9.2* 9.0* 8.2* 7.9* 7.8* 8.2*  HCT 29.0* 28.0* 25.9* 24.8* 24.7* 25.7*  MCV 93.9 94.9 93.5 95.4 93.9 94.5  PLT 528* 503* 451* 421* 419* 411*    INR: Recent Labs  Lab 03/23/23 0339 03/24/23 0402 03/25/23 0355 03/26/23 0450 03/27/23 0351  INR 2.1* 2.0* 1.9* 1.9* 1.8*  Other results: EKG:    Imaging   No results found.   Medications:     Scheduled Medications:  amiodarone  200 mg Oral BID   atorvastatin  80 mg Oral Daily   Chlorhexidine Gluconate Cloth  6 each Topical BID   docusate sodium  200 mg Oral Daily   feeding supplement  237 mL Oral Q24H   finasteride  5 mg Oral Daily   insulin aspart  0-24 Units Subcutaneous TID WC   multivitamin with minerals  1 tablet Oral Daily   pantoprazole  40 mg Oral Daily   polyethylene glycol  17 g Oral Daily   sodium chloride flush  10-40 mL Intracatheter Q12H   tamsulosin  0.4 mg Oral QPC supper   warfarin  5 mg Oral ONCE-1600   Warfarin - Pharmacist Dosing Inpatient   Does not apply q1600    Infusions:  sodium chloride Stopped (03/20/23 2025)    PRN Medications: sodium chloride, oxyCODONE, phenylephrine, traMADol, traZODone   Patient Profile   67 y/o male w/ chronic systolic heart failure due to ICM 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 Now s/p HM3 LVAD 9/12. Discharged to CIR on 9/24    Assessment/Plan:    1. Acute on chronic combined systolic and diastolic heart failure>>Low Output S/p HM3 LVAD  on 9/12.  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 NYHA IV on admission.  Echo 02/27/23: EF <20%, LV with GHK, RV mildly reduced, GIIDD, LA mod dilated, mild MR.  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 GDMT limited by renal function and hypotension.  S/P HM3 LVAD implant 9/12.  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 Intra-op TEE LVEF 15%, RV normal.  - Ramp ECHO 03/13/23. Speed increased to 5500. Pain controlled with tylenol.  - Maps stable  - Volume up a little. Torsemide 20 mg today then as needed.    Off GDMT w/ soft BPs  - LDH stable - INR 1.8   Discussed dosing with PharmD personally. - No further bleeding. Off asa  2. 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  -  No chest pain.  - off asa as above.  - Continue statin   3. AKI on CKD stage 3: Creatinine baseline 1.6-1.8 - cardiorenal, resolved w/ LVAD support - avoid hypotension. Holding GDMT per above   4. Mural thrombus: on Warfarin.  - INR 1.8   5. 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 Anemia: Post-op. S/p 1UPRBCs.  - Stable.   7. Atrial fibrillation:  - continue amio 200 mg twice a day. Taper at his follow up.  - On coumadin. INR 1.8   8. Hematuria:  - Urology consulted. Started on proscar.  - Suspect related to trauma with foley insertion.  - Resolved.   9. UTI  - Completed  antibiotic course.    10. Deconditioning:  - CIR. Appreciate their assistance    Home today. We will set up follow in the VAD clinic next week.   I reviewed the LVAD parameters from today, and compared the results to the patient's prior recorded data.  No programming changes were made.  The LVAD is functioning within specified parameters.  The patient performs LVAD self-test daily.  LVAD interrogation was negative for any significant power changes, alarms or PI events/speed drops.  LVAD equipment check  completed and is in good working order.  Back-up equipment present.   LVAD education done on emergency procedures and precautions and reviewed exit site care.  Length of Stay: 7  Tonye Becket, NP 03/27/2023, 8:48 AM  VAD Team --- VAD ISSUES ONLY--- Pager [phone] (7am - 7am)  Advanced Heart Failure Team  Pager [phone] (M-F; 7a - 5p)  Please contact [facility] for night-coverage after hours (5p -7a ) and weekends on [email or web address]   Patient seen with PA/NP, agree with the above note.   Subjective: No complaints doing very well today.   Exam: General: NAD HEENT: Normal.  Neck: Thick neck. JVP 7, no thyromegaly or thyroid nodule.  Lungs: Clear to auscultation bilaterally with normal respiratory effort. CV:  Heart regular S1/S2, no S3/S4, no murmur.  No peripheral edema.    Abdomen: Soft, nontender, no hepatosplenomegaly, no distention.  Skin: Intact without lesions or rashes.  Neurologic: awake/alert, no gross FND.  Psych: Normal affect. Extremities: No clubbing or cyanosis.   LVAD INTERROGATION:  HeartMate III LVAD:   Flow 4.6 liters/min, speed 5500, power 4, PI 2.7    A/P -Agree with above.  Plan for discharge today.  Euvolemic on exam.  Hemoglobin stable.  Will follow-up closely in LVAD clinic.  Has done very well with physical therapy .   [name] [name] Advanced Heart Failure

## 2023-03-27 NOTE — Progress Notes (Signed)
LVAD Coordinator Rounding Note:  Admitted 02/26/23 due to CHF.   HM3 LVAD implanted on 03/08/23 by Dr Laneta Simmers under DT criteria.  9/12: HM3 LVAD Implant. Intra-op TEE LVEF 15%, RV normal  9/13: Extubated.  Driveline pulled back, 1 inch of Velour exposed.  9/14: Went into atrial fibrillation with RVR,  IV amiodarone started.  9/15: Hematuria.  9/17: Urology consulted for hematuria. Proscar added. Given 1 unit of blood. Ramp Echo speed increased to 5500. 03/20/23: Discharged to CIR  Pt lying in bed on my arrival. Denies complaints. Still with productive cough. Encouraged to use IS hourly. He verbalized understanding. Bright red blood noted in stool over the weekend. ASA stopped. No further bleeding reported. Hgb 8.2 today.  VAD home equipment has arrived. Discharge education completed with pt on 03/19/23.   Discharged planned for today.VAD Coordinator spoke with pt caregiver Suzette Battiest who plans to arrive to the hospital between 11 and 12 today to review home equipment. Follow up scheduled in VAD Clinic. Pt will come twice weekly to VAD Clinic for dressing change to be performed by VAD Coordinator.  Vital signs: Temp: 97.5 HR: 80 paced Doppler Pressure: 78 Auto BP: 92/60 (71) O2 Sat: 99% on RA Wt: 155.6>169.5>162>153.4>159.3>153.4>151.9>148.8>149.7>150.1>150.3>154.5> 154.5 lbs    LVAD interrogation reveals:  Speed: 5500 Flow: 4.3 Power: 4.0 w  PI: 3.3  Alarms: none  Events: 38 PI events today Hematocrit: 29  Fixed speed: 5500 Low speed limit: 5200   Drive Line: Existing VAD dressing CDI. Anchor secure. Next dressing change due 03/29/23 in VAD Clinic.  Labs:  LDH trend: 234>310>274>302>318>306>285>310>386>270>202>233>242  INR trend: 1.6>1.5>1.6>1.5>1.7>2.3>2.6>2.3>2.5>2.4>1.8>1.9>1.8  Anticoagulation Plan: -INR Goal: 2-2.5 -ASA Dose: 81 mg   Blood Products:  -Intra op: 03/08/23 1 PRBC 2 FFP 1 PLT 1 Cryo 450 cell saver  Post op: 03/08/23 1 PRBC 2 FFP 1 PLT 1  Cryo  03/09/23: 1 PRBC 03/13/23: 1 PRBC  Device: -Medtronic -Therapies: ON  -VF > 200BMP  Arrythmias: hx afib- currently paced 80  Respiratory: on RA  Infection:   Renal:  -CRT: 1.28>1.72>1.76>1.63>1.37>1.17>1.12>1.11>1.21  Patient Education: All discharge education completed at bedside 03/19/23 with pt. Please see separate note for details.  Adverse Events on VAD:  Plan/Recommendations:  1. Please page VAD coordinator with any equipment issues or driveline problems. 2. Continue twice weekly dressing changes on Monday and Thursday using silver strip by VAD coordinator.  Simmie Davies RN,BSN VAD Coordinator  Office: [phone]  24/7 Pager: [phone]

## 2023-03-27 NOTE — Progress Notes (Signed)
ANTICOAGULATION CONSULT NOTE - Follow-up  Pharmacy Consult for warfarin management Indication:  LVAD  No Known Allergies  Patient Measurements: Height: 5\' 9"  (175.3 cm) Weight: 70.1 kg (154 lb 8.7 oz) IBW/kg (Calculated) : 70.7 Heparin Dosing Weight: 73.5 kg  Vital Signs: Temp: 97.5 F (36.4 C) (10/01 0424) BP: 89/69 (10/01 0424) Pulse Rate: 60 (10/01 0424)  Labs: Recent Labs    03/25/23 0355 03/26/23 0450 03/27/23 0351  HGB 7.9* 7.8* 8.2*  HCT 24.8* 24.7* 25.7*  PLT 421* 419* 411*  LABPROT 22.0* 22.1* 21.3*  INR 1.9* 1.9* 1.8*  CREATININE  --  1.21  --     Estimated Creatinine Clearance: 57.9 mL/min (by C-G formula based on SCr of 1.21 mg/dL).   Medical History: Past Medical History:  Diagnosis Date   Acute MI, anterior wall (HCC)    AICD (automatic cardioverter/defibrillator) present    CAD (coronary artery disease)    2D ECHO, 07/13/2011 - EF <25%, LV moderatelty dilated, LA moderately dilatedLEXISCAN, 12/14/2011 - moderate-severe perfusion defect seen in the basal anteroseptal, mid anterior, apicacl anterior, apical, apical inferior, and apical lateral regions, post-stress EF 25%, new EKG changes from baseline abnormalities   Cancer (HCC)    Prostate   CHF (congestive heart failure) (HCC) 2012   Hypertension 08/08/2021   Inguinal hernia, left    Pneumonia    November 2023   Pre-diabetes     Medications:  Medications Prior to Admission  Medication Sig Dispense Refill Last Dose   acetaminophen (TYLENOL) 500 MG tablet Take 1,000 mg by mouth every 6 (six) hours as needed for mild pain.      amiodarone (PACERONE) 200 MG tablet Take 1 tablet (200 mg total) by mouth 2 (two) times daily.      aspirin EC 81 MG tablet Take 1 tablet (81 mg total) by mouth daily. Swallow whole.      bisacodyl (DULCOLAX) 5 MG EC tablet Take 2 tablets (10 mg total) by mouth daily.      [EXPIRED] cefTRIAXone 2 g in sodium chloride 0.9 % 100 mL Inject 2 g into the vein daily for 1 day.       Cholecalciferol (VITAMIN D) 125 MCG (5000 UT) CAPS Take 5,000 Units by mouth daily.      insulin aspart (NOVOLOG) 100 UNIT/ML injection Inject 0-24 Units into the skin 3 (three) times daily with meals.      losartan (COZAAR) 25 MG tablet Take 1 tablet (25 mg total) by mouth daily.      spironolactone (ALDACTONE) 25 MG tablet Take 0.5 tablets (12.5 mg total) by mouth daily.      torsemide (DEMADEX) 20 MG tablet Take 1 tablet (20 mg total) by mouth daily as needed. Take 1 tablet by mouth every other day.      warfarin (COUMADIN) 1 MG tablet Take 1 tablet (1 mg total) by mouth one time only at 4 PM.      [DISCONTINUED] atorvastatin (LIPITOR) 80 MG tablet Take 1 tablet (80 mg total) by mouth daily. 30 tablet 3    [DISCONTINUED] docusate sodium (COLACE) 100 MG capsule Take 2 capsules (200 mg total) by mouth daily.      [DISCONTINUED] finasteride (PROSCAR) 5 MG tablet Take 1 tablet (5 mg total) by mouth daily.      [DISCONTINUED] Multiple Vitamin (MULTIVITAMIN) tablet Take 1 tablet by mouth daily.      [DISCONTINUED] pantoprazole (PROTONIX) 40 MG tablet Take 1 tablet (40 mg total) by mouth daily.      [  DISCONTINUED] tamsulosin (FLOMAX) 0.4 MG CAPS capsule Take 1 capsule (0.4 mg total) by mouth daily after supper.      [DISCONTINUED] traMADol (ULTRAM) 50 MG tablet Take 1-2 tablets (50-100 mg total) by mouth every 4 (four) hours as needed for moderate pain.      [DISCONTINUED] traZODone (DESYREL) 50 MG tablet Take 1 tablet (50 mg total) by mouth at bedtime as needed for sleep.      Scheduled:   amiodarone  200 mg Oral BID   atorvastatin  80 mg Oral Daily   Chlorhexidine Gluconate Cloth  6 each Topical BID   docusate sodium  200 mg Oral Daily   feeding supplement  237 mL Oral Q24H   finasteride  5 mg Oral Daily   insulin aspart  0-24 Units Subcutaneous TID WC   multivitamin with minerals  1 tablet Oral Daily   pantoprazole  40 mg Oral Daily   polyethylene glycol  17 g Oral Daily   sodium  chloride flush  10-40 mL Intracatheter Q12H   tamsulosin  0.4 mg Oral QPC supper   Warfarin - Pharmacist Dosing Inpatient   Does not apply q1600   Infusions:   sodium chloride Stopped (03/20/23 2025)   PRN: sodium chloride, oxyCODONE, phenylephrine, traMADol, traZODone  Assessment: Pt presented to [facility] ED on 02/26/2023 with shortness of breath and SpO2 of 78 on room air. Pt has PMH of ICM (LAD infarct), HFrEF (EF <20%), LVT, VT s/p ICD, HTN, CKD3, HLD Pt underwent HMIII implantation on 9/12. Warfarin was initiated 9/15.   INR today is slightly subtherapeutic at 1.8. Hgb stable at 8.2, plt 411, LDH 242. No s/sx of bleeding. Oral intake 75%.  Goal of Therapy:  2.0-2.5 Monitor platelets by anticoagulation protocol: Yes   Plan:  Warfarin 5 mg PO x1 Will discharge on 2.5 mg daily except 5 mg on Tues/Thursday with INR check on 10/8  Daily INR, CBC, LDH  Thank you for allowing pharmacy to participate in this patient's care,  Sherron Monday, PharmD, BCCCP Clinical Pharmacist  Phone: [phone] 03/27/2023 7:41 AM  Please check AMION for all [facility] phone numbers After 10:00 PM, call Main Pharmacy [phone]

## 2023-03-27 NOTE — Discharge Summary (Signed)
Physician Discharge Summary  Patient ID: Steven [name] MRN: [id number] DOB/AGE: 02/09/1956 67 y.o.  Admit date: 03/20/2023 Discharge date: 03/27/2023  Discharge Diagnoses:  Principal Problem:   Debility Active Problems:   Apical mural thrombus   Stage 3a chronic kidney disease (HCC)   Hypertension   AF (atrial fibrillation) (HCC)   ABLA (acute blood loss anemia)   Prediabetes   Acute on chronic combined systolic and diastolic heart failure (HCC)   Adjustment disorder with depressed mood   Hyponatremia   Discharged Condition: stable  Significant Diagnostic Studies: DG CHEST PORT 1 VIEW  Result Date: 03/20/2023 CLINICAL DATA:  Cough, leukocytosis EXAM: PORTABLE CHEST 1 VIEW COMPARISON:  Chest radiograph 03/16/2023 FINDINGS: The right upper extremity PICC terminating in the mid SVC, left chest wall cardiac device and associated leads, median sternotomy wires, and LVAD are unchanged. The heart is enlarged with dense pericardial calcifications, unchanged. The upper mediastinal contours are normal. The right pleural effusion has essentially resolved, with improved aeration of the lung base. There is a probable small residual left pleural effusion with mild adjacent airspace opacities, unchanged. There is no new or worsening focal airspace opacity. There is no overt pulmonary edema. There is no pneumothorax There is no acute osseous abnormality. IMPRESSION: 1. Probable small residual left pleural effusion and mild adjacent airspace opacity likely reflecting atelectasis. 2. Essentially resolved right pleural effusion. Electronically Signed   By: Lesia Hausen M.D.   On: 03/20/2023 11:47    Labs:  Basic Metabolic Panel: Recent Labs  Lab 03/21/23 0448 03/23/23 0339 03/24/23 0402 03/25/23 0355 03/26/23 0450 03/27/23 0351  NA 131*  --  130*  --  131*  --   K 4.2  --  4.3  --  3.9  --   CL 99  --  98  --  100  --   CO2 21*  --  22  --  25  --   GLUCOSE 80  --  95  --  88  --   BUN 19  --   21  --  15  --   CREATININE 1.11  --  1.06  --  1.21  --   CALCIUM 8.5*  --  8.4*  --  8.3*  --   MG  --  2.0 1.9 2.1 1.9 2.5*    CBC: Recent Labs  Lab 03/21/23 0448 03/23/23 0339 03/25/23 0355 03/26/23 0450 03/27/23 0351  WBC 15.4*   < > 10.2 9.0 8.0  NEUTROABS 13.0*  --   --   --   --   HGB 9.2*   < > 7.9* 7.8* 8.2*  HCT 29.0*   < > 24.8* 24.7* 25.7*  MCV 93.9   < > 95.4 93.9 94.5  PLT 528*   < > 421* 419* 411*   < > = values in this interval not displayed.    CBG: Recent Labs  Lab 03/26/23 0815 03/26/23 1148 03/26/23 1650 03/26/23 2025 03/27/23 0604  GLUCAP 187* 128* 81 116* 77    Recent weights:  Filed Weights   03/23/23 1056 03/24/23 0500 03/25/23 0547  Weight: 68.1 kg 70.8 kg 70.1 kg      Brief HPI:   Steven [name] is a 67 y.o. male  with history of ICM with EF 20-25%, systolic HF w/LV thrombus-on coumadin, VT s/p ICD, CKD III, prostate CA, SDH who was admitted on 02/26/23 with dyspnea and found to have severe decrease in LVF and calcified pericardium. He was placed  on pressors and milrinone. He was felt to require advanced therapy and LVAD placed on 03/08/23 by Dr. Maren Beach. Post op A fib w/flutter managed with addition of amiodarone and ABLA was monitored. He did develop hematuria felt to be due to foley trauma and UTI. He was also found to have persistent leucocytosis and was started on 5 day course Rocephin as well as Proscar.  Therapy initiated and patient was noted to be deconditioned. CIR was recommended due to functional decline.    Hospital Course: Steven [name] was admitted to rehab 03/20/2023 for inpatient therapies to consist of PT and OT at least three hours five days a week. Past admission physiatrist, therapy team and rehab RN have worked together to provide customized collaborative inpatient rehab.  He completed his treatment for UTI and is voiding without difficulty.  Follow-up CBC shows leukocytosis has resolved with improvement thrombocytosis.  He did  have drop in hemoglobin with bright red blood in stool on 09/29 therefore aspirin was discontinued.  Follow-up CBC shows H&H to have improved and no further signs of bleeding noted.  Nutritional supplements were added to help promote wound healing.  Cardiology has following for input and management of bed.  On 09/25, spironolactone and losartan were discontinued due to concerns of dehydration and he was bolused with 500 cc LR.  Heart rate has been controlled on current dose amiodarone.  He remains of GDMT due to soft blood pressures. Serial check of electrolytes shows mild hyponatremia to be within normal limits.  Magnesium was supplemented on 09/30 with repeat levels at 2.5.  Potassium was supplemented 09/30 and 10/01.  Weight is at 154 but due to concerns of due to concerns of volume overload, torsemide 20 mg was administered on 10/01 and patient advised to use this on prn basis.   Pharmacy has been assisting in Coumadin management and dosing.  INR is 1.8 at discharge and dose was adjusted to 5 mg Tue/Thu followed by 2.5 mg all other days.  Ego support has been provided by clean.  Insomnia has been managed with as needed use of trazodone.  His pain is well-controlled with occasional use of tramadol.  Patient and educated on management of LVAD as well as wound care.  His activity tolerance and endurance has gradually improved.  He is currently at modified independent level and supervision is recommended for safety and to help with management of LVAD equipment.  He will continue to receive follow-up home health PT, OT and RN by [facility] home health after discharge.    Rehab course: During patient's stay in rehab weekly team conferences were held to monitor patient's progress, set goals and discuss barriers to discharge. At admission, patient required min assist with mobility and mod assist with ADL tasks. He  has had improvement in activity tolerance, balance, postural control as well as ability to compensate  for deficits. He is able to complete ADL task at modified independent to supervision level He is independent for transfers and to ambulate 300' without AD. He requires supervision to min assist with mobility due to cues needed for sequencing/LVAD management. Family education has been completed.     Discharge disposition: 06-Home-Health Care Svc  Diet: Heart healthy. Low vitamin K  Special Instructions: Continue sternal precautions. No driving.  Weight self daily and use demadex prn.  3.  Next protime to be drawn 10/08 at office visit.   Allergies as of 03/27/2023   No Known Allergies      Medication List  STOP taking these medications    acetaminophen 500 MG tablet Commonly known as: TYLENOL   aspirin EC 81 MG tablet   bisacodyl 5 MG EC tablet Commonly known as: DULCOLAX   cefTRIAXone 2 g in sodium chloride 0.9 % 100 mL   insulin aspart 100 UNIT/ML injection Commonly known as: novoLOG   losartan 25 MG tablet Commonly known as: COZAAR   multivitamin tablet Replaced by: CertaVite/Antioxidants Tabs   spironolactone 25 MG tablet Commonly known as: ALDACTONE   Vitamin D 125 MCG (5000 UT) Caps       TAKE these medications    amiodarone 200 MG tablet Commonly known as: PACERONE Take 1 tablet (200 mg total) by mouth 2 (two) times daily.   atorvastatin 80 MG tablet Commonly known as: LIPITOR Take 1 tablet (80 mg total) by mouth daily.   CertaVite/Antioxidants Tabs Take 1 tablet by mouth daily. Replaces: multivitamin tablet   docusate sodium 100 MG capsule Commonly known as: COLACE Take 2 capsules (200 mg total) by mouth daily.   finasteride 5 MG tablet Commonly known as: PROSCAR Take 1 tablet (5 mg total) by mouth daily.   pantoprazole 40 MG tablet Commonly known as: PROTONIX Take 1 tablet (40 mg total) by mouth daily.   polyethylene glycol powder 17 GM/SCOOP powder Commonly known as: GLYCOLAX/MIRALAX Take 17 g by mouth daily.   tamsulosin 0.4  MG Caps capsule Commonly known as: FLOMAX Take 1 capsule (0.4 mg total) by mouth daily after supper.   torsemide 20 MG tablet Commonly known as: DEMADEX Take 1 tablet (20 mg total) by mouth daily as needed. What changed: additional instructions   traMADol 50 MG tablet--Rx # 35 pills Commonly known as: ULTRAM Take 1 tablet (50 mg total) by mouth every 4 (four) hours as needed for severe pain. What changed:  how much to take reasons to take this   traZODone 50 MG tablet Commonly known as: DESYREL Take 1 tablet (50 mg total) by mouth at bedtime as needed for sleep.   warfarin 2.5 MG tablet Commonly known as: COUMADIN Take as directed. If you are unsure how to take this medication, talk to your nurse or doctor. Original instructions: Take one tablet by mouth on Mon, Wed, Fri, Sat and Sunday with supper. Take 2 tablets on Tue and Thur with supper. What changed:  medication strength how much to take how to take this when to take this additional instructions        Follow-up Information     Fanny Dance, MD Follow up.   Specialty: Physical Medicine and Rehabilitation Why: No formal follow-up needed Contact information: [address] [place] [address] [phone]         Lovett Sox, MD Follow up.   Specialty: Cardiothoracic Surgery Why: Call for appointment Contact information: [address] [place] [address] [phone]         [name], MD Follow up.   Specialty: Family Medicine Why: Call in 1-2 days for post hospital follow up Contact information: [address] [place] [address] [phone]         Bensimhon, Bevelyn Buckles, MD Follow up on 04/03/2023.   Specialty: Cardiology Why: Post op check and for blood work. Contact information: [address] [place] [address] [phone]                 Signed: Jacquelynn Cree 03/27/2023, 11:07 AM

## 2023-03-27 NOTE — Progress Notes (Signed)
Inpatient Rehabilitation Discharge Medication Review by a Pharmacist  A complete drug regimen review was completed for this patient to identify any potential clinically significant medication issues.  High Risk Drug Classes Is patient taking? Indication by Medication  Antipsychotic No   Anticoagulant Yes Coumadin- mural thrombus  Antibiotic No   Opioid Yes OxyIR, Tramadol- acute pain  Antiplatelet No   Hypoglycemics/insulin No   Vasoactive Medication Yes Flomax- urinary hesitancy Torsemide- volume Amiodarone- AF  Chemotherapy No   Other Yes Lipitor- HLD Proscar- BPH Protonix- GERD Trazodone- sleep     Type of Medication Issue Identified Description of Issue Recommendation(s)  Drug Interaction(s) (clinically significant)     Duplicate Therapy     Allergy     No Medication Administration End Date     Incorrect Dose     Additional Drug Therapy Needed     Significant med changes from prior encounter (inform family/care partners about these prior to discharge).    Other       Clinically significant medication issues were identified that warrant physician communication and completion of prescribed/recommended actions by midnight of the next day:  No   Time spent performing this drug regimen review (minutes):  30   Conlin Brahm BS, PharmD, BCPS Clinical Pharmacist 03/27/2023 10:14 AM  Contact: [phone] after 3 PM  "Be curious, not judgmental..." -Debbora Dus

## 2023-03-27 NOTE — Progress Notes (Signed)
Patient ID: Steven [name], male   DOB: 20-May-1956, 67 y.o.   MRN: [id number]  INPATIENT REHABILITATION DISCHARGE NOTE   Discharge instructions by: Simmie Davies, RN and Marissa Nestle, PA-C  Verbalized understanding: Yes  Skin care/Wound care healing: VAD dressing CDI  Pain: None reported  IV's: PICC discontinued  Tubes/Drains: LVAD drive line  O2: N/A  Safety instructions: provided  Patient belongings: returned to patient  Discharged to: home  Discharged via: [name]  Notes:  VAD equipment given to patient for home use.

## 2023-03-27 NOTE — Progress Notes (Addendum)
PROGRESS NOTE   Subjective/Complaints:  No new concerns this morning.  He is a little anxious about discharging home.  ROS: Patient denies fever, rash, sore throat, blurred vision, dizziness, nausea, vomiting, diarrhea, cough,  or chest pain, joint or back/neck pain, headache Baseline shortness of breath with activity + Mild leg swelling  Objective:   No results found. Recent Labs    03/26/23 0450 03/27/23 0351  WBC 9.0 8.0  HGB 7.8* 8.2*  HCT 24.7* 25.7*  PLT 419* 411*   Recent Labs    03/26/23 0450  NA 131*  K 3.9  CL 100  CO2 25  GLUCOSE 88  BUN 15  CREATININE 1.21  CALCIUM 8.3*    Intake/Output Summary (Last 24 hours) at 03/27/2023 1043 Last data filed at 03/27/2023 1036 Gross per 24 hour  Intake 945 ml  Output 2700 ml  Net -1755 ml        Physical Exam: Vital Signs Blood pressure 92/60, pulse 60, temperature (!) 97.5 F (36.4 C), resp. rate 17, height 5\' 9"  (1.753 m), weight 70.1 kg, SpO2 99%.  General: No acute distress Mood and affect are appropriate Heart: Regular rate and rhythm no rubs murmurs or extra sounds Lungs: Clear to auscultation, breathing unlabored, no rales or wheezes Abdomen: Positive bowel sounds, soft nontender to palpation, nondistended.  LVAD site with dry dressing intact Extremities: 1+ bilateral lower extremity edema Skin: No evidence of breakdown, no evidence of rash  Neuro: Alert and awake, lying in bed.  Follows commands.  Oriented x 3. Normal language, speech. Functional memory and awareness. UE motor 4/5. LE motor 3+/5 HF, KE 4/5, ADF/PF 4+/5. No sensory deficits appreciated. Normal muscle tone.  Cranial nerves II through XII grossly intact. Musculoskeletal: No joint swelling noted   Assessment/Plan: 1. Functional deficits which require 3+ hours per day of interdisciplinary therapy in a comprehensive inpatient rehab setting. Physiatrist is providing close team  supervision and 24 hour management of active medical problems listed below. Physiatrist and rehab team continue to assess barriers to discharge/monitor patient progress toward functional and medical goals  Care Tool:  Bathing    Body parts bathed by patient: Right arm, Left arm, Chest, Abdomen, Right upper leg, Front perineal area, Buttocks, Left upper leg, Right lower leg, Face, Left lower leg   Body parts bathed by helper: Right lower leg, Left lower leg     Bathing assist Assist Level: Independent with assistive device     Upper Body Dressing/Undressing Upper body dressing   What is the patient wearing?: Pull over shirt    Upper body assist Assist Level: Independent    Lower Body Dressing/Undressing Lower body dressing      What is the patient wearing?: Underwear/pull up, Pants     Lower body assist Assist for lower body dressing: Independent with assitive device     Toileting Toileting    Toileting assist Assist for toileting: Independent with assistive device     Transfers Chair/bed transfer  Transfers assist     Chair/bed transfer assist level: Independent     Locomotion Ambulation   Ambulation assist      Assist level: Independent Assistive device: No Device Max  distance: 300+   Walk 10 feet activity   Assist     Assist level: Independent Assistive device: No Device   Walk 50 feet activity   Assist    Assist level: Independent Assistive device: No Device    Walk 150 feet activity   Assist    Assist level: Independent Assistive device: No Device    Walk 10 feet on uneven surface  activity   Assist Walk 10 feet on uneven surfaces activity did not occur: Safety/medical concerns   Assist level: Independent Assistive device: Other (comment) (none)   Wheelchair     Assist Is the patient using a wheelchair?: No             Wheelchair 50 feet with 2 turns activity    Assist            Wheelchair 150  feet activity     Assist          Blood pressure 92/60, pulse 60, temperature (!) 97.5 F (36.4 C), resp. rate 17, height 5\' 9"  (1.753 m), weight 70.1 kg, SpO2 99%.  Medical Problem List and Plan: 1. Functional deficits secondary to debility after heart failure/ LVAD implant 9/12.   -Sternal precautions             -patient may not yet shower             -ELOS/Goals: 7-10 days, mod I goals with PT/OT  -Continue CIR  -Estimated discharge 10/1  -Continue education regarding LVAD  -Okay for discharge home today 2.  Antithrombotics: -DVT/anticoagulation:  Pharmaceutical: Coumadin             -antiplatelet therapy: Aspirin 81 mg daily 3. Pain Management: Oxycodone/tramadol as needed 4. Mood/Behavior/Sleep: N/A             -antipsychotic agents: N/A 5. Neuropsych/cognition: This patient is capable of making decisions on his own behalf.  -Seen by neuropsychology for adjustment disorder 6. Skin/Wound Care: Routine skin checks             -local care to drive site  7. Fluids/Electrolytes/Nutrition: encourage PO             -Hyponatremia, 9/25 stable at 131 continue to monitor  -BMP every 24 hours, sodium 131 9/30 8.  Acute on chronic combined systolic and diastolic congestive heart failure.  Follow-up per heart failure team.  Spironolactone and losartan number discontinued by heart failure team             -daily weights  -10/1 mildly increased edema today.  Heart failure team gave dose of torsemide today and potassium.  Filed Weights   03/23/23 1056 03/24/23 0500 03/25/23 0547  Weight: 68.1 kg 70.8 kg 70.1 kg    9.  Acute blood loss anemia.  Follow-up CBC  - 10/1 stable at 8.2    Latest Ref Rng & Units 03/27/2023    3:51 AM 03/26/2023    4:50 AM 03/25/2023    3:55 AM  CBC  WBC 4.0 - 10.5 K/uL 8.0  9.0  10.2   Hemoglobin 13.0 - 17.0 g/dL 8.2  7.8  7.9   Hematocrit 39.0 - 52.0 % 25.7  24.7  24.8   Platelets 150 - 400 K/uL 411  419  421    May be related to GI blood loss,  have notified cardiology who will eval  , pt needs to be on warfarin to reduce thrombosis risk post LVAD , Afib hx as well.  Abd  exam ok.  Defer to cardiology regarding need for GI involvement . Cont daily CBC 10.  Hematuria/UTI/persistent leukocytosis.  Placed on Proscar per urology services.  Complete empiric course of Rocephin.  -10/1 WBC down to 8 11.  Hyperlipidemia.  Lipitor 12.  Prediabetes.  Hemoglobin A1c 6.6.  SSI  -9/26 CBGs well-controlled, continue to monitor for now 13.  CKD stage III a?  Follow-up chemistries             - 10/1 creatinine/BUN stable at 1.21/15    Latest Ref Rng & Units 03/26/2023    4:50 AM 03/24/2023    4:02 AM 03/21/2023    4:48 AM  BMP  Glucose 70 - 99 mg/dL 88  95  80   BUN 8 - 23 mg/dL 15  21  19    Creatinine 0.61 - 1.24 mg/dL 1.91  4.78  2.95   Sodium 135 - 145 mmol/L 131  130  131   Potassium 3.5 - 5.1 mmol/L 3.9  4.3  4.2   Chloride 98 - 111 mmol/L 100  98  99   CO2 22 - 32 mmol/L 25  22  21    Calcium 8.9 - 10.3 mg/dL 8.3  8.4  8.5     14.  Atrial fibrillation.  Amiodarone 200 mg twice daily.  Continue Coumadin  -Coumadin dosing per pharmacy     03/27/2023    8:44 AM 03/27/2023    4:24 AM 03/27/2023   12:10 AM  Vitals with BMI  Systolic 92 89 92  Diastolic 60 69 56  Pulse  60 73    15.  Transaminitis.   -Suspect cardiac related.  Stable from prior.  Avoid hepatotoxic medications if possible.  16.  Bright red blood in stool.  -ASA was discontinued by cardiology.  Suspected to be hemorrhoid related.  -10/1 no further bleeding on bowel movement yesterday  LOS: 7 days A FACE TO FACE EVALUATION WAS PERFORMED  Fanny Dance 03/27/2023, 10:43 AM

## 2023-03-27 NOTE — Progress Notes (Signed)
Discharge equipment includes:  1. Two system controllers. 2. Mobile Power Unit (MPU) with 20' patient cable 3. One universal Magazine features editor (UBC) 4. Eight fully charged batteries  5. Four battery clips 6. One travel case 7. One holster vest 8. 1 Consolidated bag  8. Wearable accessory package 9. Daily dressing kits, anchors, Aquacel (silver dressing)   VAD Education:    1. Reviewed importance of having a 24 hour caregiver 2. Reviewed dressing change frequency 3. Reviewed when to call the VAD pager and made sure they have phone number in their phone.  4. Reviewed importance of changing one power source at a time 5. Reviewed importance of carrying black emergency bag containing backup controller, 2 batteries, and 2 battery clips, everywhere 6. Reviewed importance of placing mobile power unit (MPU) and batteries on bedside table with a flashlight. Talked about what to do in case of power failure. Reminded to make sure the outlets that equipment is plugged into are not controlled by a light switch.  7. Reviewed importance of using anchors to hold drive line in place, to prevent accidental pulling, or dislodgement of drive line.  8. Patient and family agreed to pick up prescriptions. Stressed importance of taking Warfarin daily in the evening. Stressed importance of taking all prescribed medications as written 9. First clinic visit bring all medications and VAD log.    Simmie Davies RN, BSN VAD Coordinator 24/7 Pager [phone]

## 2023-03-29 ENCOUNTER — Ambulatory Visit (INDEPENDENT_AMBULATORY_CARE_PROVIDER_SITE_OTHER): Payer: 59 | Admitting: Cardiovascular Disease

## 2023-03-29 ENCOUNTER — Encounter: Payer: Self-pay | Admitting: Cardiovascular Disease

## 2023-03-29 ENCOUNTER — Ambulatory Visit
Admit: 2023-03-29 | Discharge: 2023-03-29 | Disposition: A | Discharge status: Home / Self Care | Payer: 59 | Attending: Internal Medicine | Admitting: Internal Medicine

## 2023-03-29 VITALS — BP 90/90 | HR 80 | Ht 69.0 in | Wt 150.2 lb

## 2023-03-29 DIAGNOSIS — I472 Ventricular tachycardia, unspecified: Secondary | ICD-10-CM | POA: Insufficient documentation

## 2023-03-29 DIAGNOSIS — R531 Weakness: Secondary | ICD-10-CM | POA: Diagnosis not present

## 2023-03-29 DIAGNOSIS — I255 Ischemic cardiomyopathy: Secondary | ICD-10-CM

## 2023-03-29 DIAGNOSIS — I4891 Unspecified atrial fibrillation: Secondary | ICD-10-CM | POA: Insufficient documentation

## 2023-03-29 DIAGNOSIS — I110 Hypertensive heart disease with heart failure: Secondary | ICD-10-CM | POA: Insufficient documentation

## 2023-03-29 DIAGNOSIS — Z79899 Other long term (current) drug therapy: Secondary | ICD-10-CM

## 2023-03-29 DIAGNOSIS — I5042 Chronic combined systolic (congestive) and diastolic (congestive) heart failure: Secondary | ICD-10-CM | POA: Diagnosis not present

## 2023-03-29 DIAGNOSIS — Z87891 Personal history of nicotine dependence: Secondary | ICD-10-CM | POA: Insufficient documentation

## 2023-03-29 DIAGNOSIS — Z95810 Presence of automatic (implantable) cardiac defibrillator: Secondary | ICD-10-CM | POA: Diagnosis not present

## 2023-03-29 DIAGNOSIS — E785 Hyperlipidemia, unspecified: Secondary | ICD-10-CM | POA: Insufficient documentation

## 2023-03-29 DIAGNOSIS — Z8546 Personal history of malignant neoplasm of prostate: Secondary | ICD-10-CM | POA: Insufficient documentation

## 2023-03-29 DIAGNOSIS — I2510 Atherosclerotic heart disease of native coronary artery without angina pectoris: Secondary | ICD-10-CM | POA: Diagnosis not present

## 2023-03-29 DIAGNOSIS — I4892 Unspecified atrial flutter: Secondary | ICD-10-CM | POA: Diagnosis not present

## 2023-03-29 DIAGNOSIS — Z01818 Encounter for other preprocedural examination: Secondary | ICD-10-CM | POA: Diagnosis present

## 2023-03-29 DIAGNOSIS — D6869 Other thrombophilia: Secondary | ICD-10-CM

## 2023-03-29 DIAGNOSIS — Z95811 Presence of heart assist device: Secondary | ICD-10-CM

## 2023-03-29 DIAGNOSIS — I9789 Other postprocedural complications and disorders of the circulatory system, not elsewhere classified: Secondary | ICD-10-CM

## 2023-03-29 DIAGNOSIS — I513 Intracardiac thrombosis, not elsewhere classified: Secondary | ICD-10-CM

## 2023-03-29 DIAGNOSIS — Z5181 Encounter for therapeutic drug level monitoring: Secondary | ICD-10-CM

## 2023-03-29 NOTE — Progress Notes (Signed)
Pt presents to [facility] Clinic today with caregiver Suzette Battiest for dressing change. Pt states he has been adjusting well after discharge. No reported issues with drive line or VAD equipment.  Drive Line: Existing VAD dressing removed and site care performed using sterile technique. Drive line exit site cleaned with Chlora prep applicators x 2, allowed to dry, and gauze dressing with Silver strip applied. Remaining suture removed today. Exit site unincorporated, the velour is exposed approx 1" at exit site. Scant dried bloody drainage, no redness, tenderness, foul odor or rash noted. Drive line anchor re-applied. Pt denies fever or chills.     Plan: Return to VAD Clinic Tuesday at 0800 for full visit with Dr.Bensimhon  Simmie Davies RN,BSN VAD Coordinator  Office: [phone]  24/7 Pager: [phone]

## 2023-03-29 NOTE — Patient Instructions (Signed)
Medication Instructions:  No changes *If you need a refill on your cardiac medications before your next appointment, please call your pharmacy*  Follow-Up: At [facility], you and your health needs are our priority.  As part of our continuing mission to provide you with exceptional heart care, we have created designated Provider Care Teams.  These Care Teams include your primary Cardiologist (physician) and Advanced Practice Providers (APPs -  Physician Assistants and Nurse Practitioners) who all work together to provide you with the care you need, when you need it.  We recommend signing up for the patient portal called "MyChart".  Sign up information is provided on this After Visit Summary.  MyChart is used to connect with patients for Virtual Visits (Telemedicine).  Patients are able to view lab/test results, encounter notes, upcoming appointments, etc.  Non-urgent messages can be sent to your provider as well.   To learn more about what you can do with MyChart, go to [email or web address].    Your next appointment:    Follow up as needed  Provider:   Dr Sallyanne Kuster

## 2023-03-29 NOTE — Progress Notes (Signed)
Patient ID: Steven [name], male   DOB: [birth year], 67 y.o.   MRN: [id number]    Cardiology Office Note    Date:  03/29/2023   ID:  Steven [name], Steven [name] 07-Jul-1955, MRN [id number]  PCP:  Steven Medina, MD  Cardiologist:  Steven [name], M.D.; Steven Fair, MD   Chief Complaint  Patient presents with   ICD check    History of Present Illness:  Steven [name] is a 67 y.o. male with severe ischemic cardiomyopathy following an extensive infarction in the LAD artery territory complicated by severe left ventricular systolic dysfunction, left ventricular apical thrombus and ventricular tachycardia. He received a defibrillator for primary prevention in 2012 [facility], generator change 01/2019, leads 2012). He received appropriate shocks for ventricular tachycardia in July and August 2024 in the setting of deteriorating left ventricular function.  He underwent destination therapy HeartMate 3 LVAD implantation 03/08/2023, was discharged to inpatient rehab on 03/20/2023 and then discharged to home 03/27/2023.  He had some brief problems with postoperative atrial fibrillation and atrial flutter and was started on amiodarone.  Additional medical problems include a history of treated prostate cancer, subdural hematoma following syncope related to VT several years ago, and the findings of calcified pericardium at the time of his LVAD surgery.  He has progressed remarkably well since LVAD implantation.  He continues to live independently, with support from his daughter and ex-wife.  He plans to return to work after 3 to 4 months.  He has not had any problems with chest pain or shortness of breath.  He is walking without support.  He has mild bilateral ankle edema.  He does not have orthopnea or PND.  He has not had any neurological events or bleeding problems.  He is not aware of any palpitations and has not experienced syncope.  No fever or chills.  Device interrogation shows no new arrhythmic events since his  discharge from the hospital when there was a brief period of atrial fibrillation/atrial flutter.  He has not had any ventricular tachycardia.  Estimated generator longevity is 5.5 years and lead parameters are normal.  He has 95% atrial pacing and only 0.2% ventricular pacing.  The heart rate histogram distribution appears appropriate.  Thoracic impedance/OptiVol is currently not interpretable with his recent chest surgery.  Past Medical History:  Diagnosis Date   Acute MI, anterior wall (HCC)    AICD (automatic cardioverter/defibrillator) present    CAD (coronary artery disease)    2D ECHO, 07/13/2011 - EF <25%, LV moderatelty dilated, LA moderately dilatedLEXISCAN, 12/14/2011 - moderate-severe perfusion defect seen in the basal anteroseptal, mid anterior, apicacl anterior, apical, apical inferior, and apical lateral regions, post-stress EF 25%, new EKG changes from baseline abnormalities   Cancer [facility])    Prostate   CHF (congestive heart failure) (HCC) 2012   Hypertension 08/08/2021   Inguinal hernia, left    Pneumonia    November 2023   Pre-diabetes     Past Surgical History:  Procedure Laterality Date   CARDIAC CATHETERIZATION  11/25/2010   Predilation balloon-Apex monorail 2x10mm, Cutting balloon-2.25x50mm, resulting in a reduction of 100% stenosis down to less than 10%   CARDIAC CATHETERIZATION  07/12/2010   LAD stented with a 3.5x26mm bare-metal Veriflex stent resulting in a reduction of 100% lesion to 0%   CARDIAC DEFIBRILLATOR PLACEMENT  03/02/2011   Medtronic Protecta XT DR, model #[id number], serial #[id number]   COLONOSCOPY     GOLD SEED IMPLANT N/A 07/21/2021   Procedure: GOLD  SEED IMPLANT;  Surgeon: Steven Purpura, MD;  Location: [facility] ORS;  Service: Urology;  Laterality: N/A;  NEEDS 30 MIN   HERNIA REPAIR  2018   ICD GENERATOR CHANGEOUT N/A 02/24/2019   Procedure: ICD GENERATOR CHANGEOUT;  Surgeon: Steven Fair, MD;  Location: [facility] INVASIVE CV LAB;  Service: Cardiovascular;   Laterality: N/A;   INSERTION OF IMPLANTABLE LEFT VENTRICULAR ASSIST DEVICE N/A 03/08/2023   Procedure: INSERTION OF IMPLANTABLE LEFT VENTRICULAR ASSIST DEVICE;  Surgeon: Steven Borne, MD;  Location: [facility] OR;  Service: Open Heart Surgery;  Laterality: N/A;   PACEMAKER PLACEMENT  07/14/2010   Temporary placement of pacemaker, if rhythm issue continues will need a permanent device   RIGHT HEART CATH N/A 02/28/2023   Procedure: RIGHT HEART CATH;  Surgeon: Steven Ouch, MD;  Location: [facility] INVASIVE CV LAB;  Service: Cardiovascular;  Laterality: N/A;   RIGHT HEART CATH N/A 03/06/2023   Procedure: RIGHT HEART CATH;  Surgeon: Steven Nettles, DO;  Location: [facility] INVASIVE CV LAB;  Service: Cardiovascular;  Laterality: N/A;   SPACE OAR INSTILLATION N/A 07/21/2021   Procedure: SPACE OAR INSTILLATION;  Surgeon: Steven Purpura, MD;  Location: [facility] ORS;  Service: Urology;  Laterality: N/A;   TEE WITHOUT CARDIOVERSION N/A 03/08/2023   Procedure: TRANSESOPHAGEAL ECHOCARDIOGRAM;  Surgeon: Steven Borne, MD;  Location: [facility] OR;  Service: Open Heart Surgery;  Laterality: N/A;    Outpatient Medications Prior to Visit  Medication Sig Dispense Refill   amiodarone (PACERONE) 200 MG tablet Take 1 tablet (200 mg total) by mouth 2 (two) times daily. 60 tablet 0   atorvastatin (LIPITOR) 80 MG tablet Take 1 tablet (80 mg total) by mouth daily. 30 tablet 0   docusate sodium (COLACE) 100 MG capsule Take 2 capsules (200 mg total) by mouth daily. 100 capsule 0   finasteride (PROSCAR) 5 MG tablet Take 1 tablet (5 mg total) by mouth daily. 30 tablet 0   Multiple Vitamins-Minerals (CERTAVITE/ANTIOXIDANTS) TABS Take 1 tablet by mouth daily. 130 tablet 0   pantoprazole (PROTONIX) 40 MG tablet Take 1 tablet (40 mg total) by mouth daily. 30 tablet 0   polyethylene glycol powder (GLYCOLAX/MIRALAX) 17 GM/SCOOP powder Take 17 g by mouth daily. 476 g 0   tamsulosin (FLOMAX) 0.4 MG CAPS capsule Take 1 capsule (0.4 mg total) by mouth daily  after supper. 30 capsule 0   torsemide (DEMADEX) 20 MG tablet Take 1 tablet (20 mg total) by mouth daily as needed. 30 tablet 0   traMADol (ULTRAM) 50 MG tablet Take 1 tablet (50 mg total) by mouth every 4 (four) hours as needed for severe pain. 35 tablet 0   traZODone (DESYREL) 50 MG tablet Take 1 tablet (50 mg total) by mouth at bedtime as needed for sleep. 30 tablet 0   warfarin (COUMADIN) 2.5 MG tablet Take one tablet by mouth on Mon, Wed, Fri, Sat and Sunday with supper. Take 2 tablets on Tue and Thur with supper. 50 tablet 0   No facility-administered medications prior to visit.     Allergies:   Patient has no known allergies.   Social History   Socioeconomic History   Marital status: Single    Spouse name: Not on file   Number of children: 1   Years of education: Not on file   Highest education level: Bachelor's degree (e.g., BA, AB, BS)  Occupational History   Occupation: works on Systems analyst  Tobacco Use   Smoking status: Former    Current packs/day: 0.00  Types: Cigarettes, Cigars    Quit date: 2012    Years since quitting: 12.7   Smokeless tobacco: Never  Vaping Use   Vaping status: Never Used  Substance and Sexual Activity   Alcohol use: Yes    Alcohol/week: 2.0 - 3.0 standard drinks of alcohol    Types: 2 - 3 drink(s) per week   Drug use: No   Sexual activity: Not Currently  Other Topics Concern   Not on file  Social History Narrative   Not on file   Social Determinants of Health   Financial Resource Strain: Low Risk  (05/08/2022)   Overall Financial Resource Strain (CARDIA)    Difficulty of Paying Living Expenses: Not hard at all  Food Insecurity: No Food Insecurity (02/26/2023)   Hunger Vital Sign    Worried About Running Out of Food in the Last Year: Never true    Ran Out of Food in the Last Year: Never true  Transportation Needs: No Transportation Needs (02/26/2023)   PRAPARE - Administrator, Civil Service (Medical): No    Lack of  Transportation (Non-Medical): No  Physical Activity: Not on file  Stress: Not on file  Social Connections: Unknown (11/08/2021)   Received from [facility]   Social Network    Social Network: Not on file     ROS:   Please see the history of present illness.    ROS All other systems reviewed and are negative.   Studies Reviewed: [name]   EKG Interpretation Date/Time:  Thursday March 29 2023 08:38:09 EDT Ventricular Rate:  80 PR Interval:  238 QRS Duration:  120 QT Interval:  580 QTC Calculation: 668 R Axis:   -82  Text Interpretation: Poor data quality, interpretation may be adversely affected (LVAD artifact) Atrial-paced rhythm with prolonged AV conduction Left axis deviation Inferior infarct , age undetermined Anterolateral infarct , age undetermined When compared with ECG of 18-Mar-2023 11:31, Electronic atrial pacemaker has replaced Electronic ventricular pacemaker Confirmed by Dyanna Seiter [phone]) on 03/29/2023 9:07:02 AM     Risk Assessment/Calculations:    CHA2DS2-VASc Score = 4   This indicates a 4.8% annual risk of stroke. The patient's score is based upon: CHF History: 1 HTN History: 1 Diabetes History: 0 Stroke History: 0 Vascular Disease History: 1 Age Score: 1 Gender Score: 0      PHYSICAL EXAM:   VS:  BP (!) 90/90 (BP Location: Left Arm, Patient Position: Sitting, Cuff Size: Normal)   Pulse 80   Ht 5\' 9"  (1.753 m)   Wt 150 lb 3.2 oz (68.1 kg)   SpO2 99%   BMI 22.18 kg/m   mean blood pressure roughly 90 mmHg.  General: Alert, oriented x3, no distress, healthy ICD site.  Healthy LVAD driveline site. Head: Pale mucous membranes, no evidence of trauma, PERRL, EOMI, no exophtalmos or lid lag, no myxedema, no xanthelasma; normal ears, nose and oropharynx Neck: 4-5 cm jugular venous pulsations and no hepatojugular reflux; brisk carotid pulses without delay and no carotid bruits Chest: clear to auscultation, no signs of consolidation by  percussion or palpation, normal fremitus, symmetrical and full respiratory excursions Cardiovascular: LVAD operation hum, no murmurs, rubs or gallops Abdomen: no tenderness or distention, no masses by palpation, no abnormal pulsatility or arterial bruits, normal bowel sounds, no hepatosplenomegaly Extremities: Occasional palpable radial pulse; 1+ soft pitting bilateral ankle edema, no pretibial edema. Neurological: grossly nonfocal Psych: Normal mood and affect   Wt Readings from Last 3 Encounters:  03/29/23 150 lb 3.2 oz (68.1 kg)  03/25/23 154 lb 8.7 oz (70.1 kg)  03/20/23 149 lb 11.1 oz (67.9 kg)      Studies/Labs Reviewed:     Lipid Panel    Component Value Date/Time   CHOL 101 03/02/2023 0500   CHOL 121 04/18/2022 0958   CHOL 161 07/01/2013 0838   TRIG 35 03/02/2023 0500   TRIG 108 07/01/2013 0838   HDL 33 (L) 03/02/2023 0500   HDL 45 04/18/2022 0958   HDL 43 07/01/2013 0838   CHOLHDL 3.1 03/02/2023 0500   VLDL 7 03/02/2023 0500   LDLCALC 61 03/02/2023 0500   LDLCALC 64 04/18/2022 0958   LDLCALC 96 07/01/2013 0838    ASSESSMENT:    1. Chronic combined systolic and diastolic heart failure (HCC)   2. Presence of left ventricular assist device (LVAD) (HCC)   3. Coronary artery disease involving native coronary artery of native heart without angina pectoris   4. History of VT   5. Postoperative atrial fibrillation (HCC)   6. Encounter for monitoring amiodarone therapy   7. Implantable cardioverter-defibrillator (ICD) in situ   8. LV (left ventricular) mural thrombus   9. Acquired thrombophilia (HCC)   10. Hyperlipidemia LDL goal <70       PLAN:  In order of problems listed above:  CHF s/p LVAD: He looks remarkably well considering how recent his surgery was.  He is understandably weak, but he does not have any angina or dyspnea.  He has very mild ankle edema.   CAD: Denies angina. VT: Has not had any episodes since his LVAD implantation. AFib: Had postop  A-fib that has resolved on amiodarone. Amiodarone: Discussed the need for routine liver function test and thyroid function tests while on this medication. ICD: Normal device function.  Continue remote downloads every 3 months.  The likelihood of ventricular arrhythmias is now lower with a decompressed ventricle.  Discussed the fact that we may decide to turn therapies off since he now has an LVAD. LV thrombus: Not seen on recent echoes, now has an apical LVAD cannula. Anticoagulation: Denies bleeding problems.  INR 2 days ago was 2.5. HLP: On statin.  LDL cholesterol is at target.   Medication Adjustments/Labs and Tests Ordered: Current medicines are reviewed at length with the patient today.  Concerns regarding medicines are outlined above.  Medication changes, Labs and Tests ordered today are listed in the Patient Instructions below. Patient Instructions  Medication Instructions:  No changes *If you need a refill on your cardiac medications before your next appointment, please call your pharmacy*  Follow-Up: At [facility], you and your health needs are our priority.  As part of our continuing mission to provide you with exceptional heart care, we have created designated Provider Care Teams.  These Care Teams include your primary Cardiologist (physician) and Advanced Practice Providers (APPs -  Physician Assistants and Nurse Practitioners) who all work together to provide you with the care you need, when you need it.  We recommend signing up for the patient portal called "MyChart".  Sign up information is provided on this After Visit Summary.  MyChart is used to connect with patients for Virtual Visits (Telemedicine).  Patients are able to view lab/test results, encounter notes, upcoming appointments, etc.  Non-urgent messages can be sent to your provider as well.   To learn more about what you can do with MyChart, go to [email or web address].    Your next appointment:    Follow  up as needed  Provider:   Dr Royann Shivers        Signed, Steven Fair, MD  03/29/2023 2:27 PM    [facility] [address] [place], [place], [place]  [address] Phone: [phone]; Fax: [phone]

## 2023-03-30 ENCOUNTER — Telehealth: Payer: Self-pay | Admitting: Student

## 2023-03-30 ENCOUNTER — Telehealth (HOSPITAL_COMMUNITY): Payer: Self-pay | Admitting: Cardiology

## 2023-03-30 NOTE — Telephone Encounter (Signed)
[facility]  Caller #1Annabelle Harman [phone] Called to check if patient will be set up for home doppler. Reports in the past VAD pts had dopplers in the home  Caller #2 Winferd Humphrey [phone] New start of care, will need verbal to begin services. Ok to leave details on confidential VM  Caller #3 Jae Dire PT [phone] Called to request verbal for home PT

## 2023-03-30 NOTE — Telephone Encounter (Signed)
Patient contacted and made aware that appt on 10/11 with Marjie Skiff is being cancelled and he is to keep follow up appt with CHF clinic on 10/08.

## 2023-03-30 NOTE — Telephone Encounter (Signed)
-----   Message from Corrin Parker sent at 03/30/2023  1:19 PM EDT ----- Regarding: FW: Appt 10/11 Hey Scheduling Team,  Please see the message below. Can you please help me with this?  Thank you so much! Callie ----- Message ----- From: Corrin Parker, PA-C Sent: 03/28/2023   1:32 PM EDT To: Alyson Ingles, LPN Subject: Appt 10/11                                     This patient is scheduled to see me on 04/06/2023. This was scheduled as a routine 3 month follow-up. However, she was recently admitted for CHF and ended up having a LVAD placed. Therefore, she will now be followed by the Advanced CHF team. She already has a follow-up appointment with Dr. Gala Romney scheduled for 10/8/024. Can you please cancel her appointment with me and call and make patient aware of this?  Thank you so much! Callie

## 2023-04-02 ENCOUNTER — Telehealth: Payer: Self-pay

## 2023-04-02 NOTE — Telephone Encounter (Signed)
Pt informed of appointment cancellation, he states that he was already notified.

## 2023-04-02 NOTE — Telephone Encounter (Signed)
-----   Message from Corrin Parker sent at 03/28/2023  1:26 PM EDT ----- Regarding: Appt 10/11 This patient is scheduled to see me on 04/06/2023. This was scheduled as a routine 3 month follow-up. However, she was recently admitted for CHF and ended up having a LVAD placed. Therefore, she will now be followed by the Advanced CHF team. She already has a follow-up appointment with Dr. Gala Romney scheduled for 10/8/024. Can you please cancel her appointment with me and call and make patient aware of this?  Thank you so much! Callie

## 2023-04-03 ENCOUNTER — Other Ambulatory Visit (HOSPITAL_COMMUNITY): Payer: Self-pay

## 2023-04-03 ENCOUNTER — Other Ambulatory Visit: Payer: Self-pay

## 2023-04-03 ENCOUNTER — Ambulatory Visit (HOSPITAL_COMMUNITY): Payer: Self-pay | Admitting: Pharmacist

## 2023-04-03 ENCOUNTER — Ambulatory Visit (HOSPITAL_COMMUNITY)
Admit: 2023-04-03 | Discharge: 2023-04-03 | Disposition: A | Discharge status: Home / Self Care | Payer: 59 | Attending: Internal Medicine | Admitting: Internal Medicine

## 2023-04-03 VITALS — BP 104/0 | HR 80 | Ht 70.0 in | Wt 150.2 lb

## 2023-04-03 DIAGNOSIS — I472 Ventricular tachycardia, unspecified: Secondary | ICD-10-CM

## 2023-04-03 DIAGNOSIS — Z7901 Long term (current) use of anticoagulants: Secondary | ICD-10-CM | POA: Insufficient documentation

## 2023-04-03 DIAGNOSIS — I5022 Chronic systolic (congestive) heart failure: Secondary | ICD-10-CM | POA: Diagnosis not present

## 2023-04-03 DIAGNOSIS — Z95811 Presence of heart assist device: Secondary | ICD-10-CM | POA: Diagnosis present

## 2023-04-03 DIAGNOSIS — I2510 Atherosclerotic heart disease of native coronary artery without angina pectoris: Secondary | ICD-10-CM | POA: Diagnosis not present

## 2023-04-03 DIAGNOSIS — I513 Intracardiac thrombosis, not elsewhere classified: Secondary | ICD-10-CM

## 2023-04-03 LAB — CBC
HCT: 32.2 % — ABNORMAL LOW (ref 39.0–52.0)
Hemoglobin: 10.4 g/dL — ABNORMAL LOW (ref 13.0–17.0)
MCH: 30.4 pg (ref 26.0–34.0)
MCHC: 32.3 g/dL (ref 30.0–36.0)
MCV: 94.2 fL (ref 80.0–100.0)
Platelets: 317 10*3/uL (ref 150–400)
RBC: 3.42 MIL/uL — ABNORMAL LOW (ref 4.22–5.81)
RDW: 15 % (ref 11.5–15.5)
WBC: 8.7 10*3/uL (ref 4.0–10.5)
nRBC: 0 % (ref 0.0–0.2)

## 2023-04-03 LAB — BASIC METABOLIC PANEL
Anion gap: 9 (ref 5–15)
BUN: 15 mg/dL (ref 8–23)
CO2: 23 mmol/L (ref 22–32)
Calcium: 8.9 mg/dL (ref 8.9–10.3)
Chloride: 102 mmol/L (ref 98–111)
Creatinine, Ser: 1.2 mg/dL (ref 0.61–1.24)
GFR, Estimated: >60 mL/min (ref >60)
Glucose, Bld: 109 mg/dL — ABNORMAL HIGH (ref 70–99)
Potassium: 4.1 mmol/L (ref 3.5–5.1)
Sodium: 134 mmol/L — ABNORMAL LOW (ref 135–145)

## 2023-04-03 LAB — PROTIME-INR
INR: 2 — ABNORMAL HIGH (ref 0.8–1.2)
Prothrombin Time: 23 s — ABNORMAL HIGH (ref 11.4–15.2)

## 2023-04-03 LAB — LACTATE DEHYDROGENASE: LDH: 280 U/L — ABNORMAL HIGH (ref 98–192)

## 2023-04-03 MED ORDER — ENTRESTO 24-26 MG PO TABS
1.0000 | ORAL_TABLET | Freq: Two times a day (BID) | ORAL | 3 refills | Status: DC
  Filled 2023-04-03: qty 60, 30d supply, fill #0

## 2023-04-03 MED ORDER — AMIODARONE HCL 200 MG PO TABS
200.0000 mg | ORAL_TABLET | Freq: Every day | ORAL | 0 refills | Status: DC
  Filled 2023-04-03: qty 60, 60d supply, fill #0

## 2023-04-03 NOTE — Progress Notes (Signed)
Patient presents for hosp follow up in VAD Clinic today with his ex-wife [name]. Reports no problems with VAD equipment or concerns with drive line.   Pt had LVAD placed on 03/08/23. He was d/c to CIR 03/20/23-03/27/23.  Pt tells me that he has been doing well at home. He is having PT/OT at home. He walked into clinic today unassisted. He tells me that he is ready to get busy, start driving and get back to work. Pt informed that he will need to wait a few more weeks before he can drive as he is only 4 weeks out from implant.  Annice Pih into see pt regarding financial concerns.  Pt given Ensure today in clinic for nutritional deficits.   Vital Signs:  Doppler Pressure 104   Automatc BP: 117/103 (109) HR: 80   SPO2:UTO  %   Weight: 150.2 lb w/o eqt Last weight: 150 lb Home weights: 139-150 lbs   VAD Indication: Destination Therapy - pt preference, social issues   VAD interrogation & Equipment Management: Speed: 5500 Flow: 3.7 Power:4.1 w    PI: 6.2   Alarms: no clinical alarms Events: 20-40 daily  Fixed speed 5500 Low speed limit: 5200   Primary Controller:  Replace back up battery in 32 months. Back up controller:   pt left in car   Annual Equipment Maintenance on UBC/PM was performed on 03/08/23.    I reviewed the LVAD parameters from today and compared the results to the patient's prior recorded data. LVAD interrogation was NEGATIVE for significant power changes, NEGATIVE for clinical alarms and STABLE for PI events/speed drops. No programming changes were made and pump is functioning within specified parameters. Pt is performing daily controller and system monitor self tests along with completing weekly and monthly maintenance for LVAD equipment.   LVAD equipment check completed and is in good working order. Back-up equipment present. Charged back up battery and performed self-test on equipment.    Exit Site Care: Drive line is being maintained twice a week by VAD  coordinators. Existing VAD dressing removed and site care performed using sterile technique. Drive line exit site cleaned with Chlora prep applicators x 2, allowed to dry, and gauze dressing applied. Exit site healed and incorporated, the velour is exposed approx 1" at exit site. No redness, tenderness, drainage, foul odor or rash noted. Drive line anchor re-applied. Pt denies fever or chills. Pt will return in 1 week, if no drainage can advance pt to weekly dressing using weekly kit. Pt states they have adequate dressing supplies at home.    Significant Events on VAD Support:    Device: Medtronic Therapies: on VF 200 BPM Last check: 03/14/23    BP & Labs:  MAP 109 - Doppler is reflecting MAP   Hgb 10.4 - No S/S of bleeding. Specifically denies melena/BRBPR or nosebleeds.   LDH stable at 280 with established baseline of 230-290. Denies tea-colored urine. No power elevations noted on interrogation.   Plan: We increased your speed to 5600 today Decrease Amiodarone 200 mg daily (1 tablet) Start Entresto 24-26-take 1 pill twice daily Return to clinic on Tuesday for a dressing change/INR Return to clinic in 2 weeks for a visit with DR Bensimhon w/thyroid panel   Carlton Adam RN VAD Coordinator    Office: [phone] 24/7 Emergency VAD Pager: [phone]

## 2023-04-03 NOTE — Patient Instructions (Signed)
We increased your speed to 5600 today Decrease Amiodarone 200 mg daily (1 tablet) Start Entresto 24-26-take 1 pill twice daily Return to clinic on Tuesday for a dressing change/INR Return to clinic in 2 weeks for a visit with DR Bensimhon

## 2023-04-03 NOTE — Progress Notes (Signed)
H&V Care Navigation CSW Progress Note  Clinical Social Worker met with patient to assist with some financial concerns.  Patient is participating in a Managed Medicaid Plan:  No  CSW met with patient in the clinic as he shared some financial concerns with a bill from the [facility] pharmacy. Patient shared that typically he does not have a co pay for medications but this was from discharge. Patient also shared that he is struggling financially until he gets back to work after recovery from LVAD surgery.   Patient mentioned that he is adjusting to the equipment but still has some getting used to life with the LVAD. He shared that he felt awkward going into the bank recently as he felt as though they thought he was "carrying" with his equipment under his shirt. Patient inquired about an LVAD shirt to help manage his equipment.   CSW shared about the LVAD Support Group to help patient with adjustment to LVAD life and meet others who would share how they wear equipment and manage life with an LVAD. Patient appears to be open to attending the Support Group and CSW will assist with obtaining a LVAD shirt. CSW will continue to follow and be available as needed. Lasandra Beech, LCSW, CCSW-MCS [phone]   SDOH Screenings   Food Insecurity: No Food Insecurity (02/26/2023)  Housing: High Risk (02/26/2023)  Transportation Needs: No Transportation Needs (02/26/2023)  Utilities: Not At Risk (02/26/2023)  Alcohol Screen: Low Risk  (05/08/2022)  Financial Resource Strain: High Risk (04/03/2023)  Social Connections: Unknown (11/08/2021)   Received from [facility]  Tobacco Use: Medium Risk (03/29/2023)

## 2023-04-03 NOTE — Progress Notes (Signed)
LVAD INR 

## 2023-04-05 NOTE — Progress Notes (Signed)
LVAD CLINIC NOTE  PCP: [name], MD HF doc: DB   HPI:  Steven [name] is a 67 y.o. 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 Underwent HM-3 VAD placement 0n 03/08/23   Originally from [place], suffered a large out of hospital anterior wall myocardial infarction January 2012.  He presented after a three-hour delay to the hospital and was taken emergently for cath with DES to LAD. EF was 35-40% at time of MI. Unfortunately, did not have recovery of EF. C/w progressive decline in systolic function over the years. Ultimately underwent ICD.    Admitted on 02/26/23 w/ NYHA Class IV symptoms and low output. Echo showed EF < 20%, RV mildly reduced. RHC showed with low output. CI 2.1. Calcified pericardium (no H/o TB). Underwent HMIII LVAD implant on 03/08/23. Intra-op TEE LVEF 15%, RV normal.  Post op course was c/b afib w/ RVR, requiring IV amiodarone. Converted back to NSR.   Due to deconditioning, PT/OT recommended CIR.  Here for first post-hospital visit. Getting used to his equipment. Slowly getting stronger with HHPT/OT. Able to do ADLs. Walking every day. Denies orthopnea or PND. No fevers, chills or problems with driveline. No bleeding, melena or neuro symptoms. No VAD alarms. Taking all meds as prescribed.     VAD Indication: Destination Therapy - pt preference, social issues   VAD interrogation & Equipment Management: Speed: 5500 Flow: 3.7 Power:4.1 w    PI: 6.2   Alarms: no clinical alarms Events: 20-40 daily  Fixed speed 5500 Low speed limit: 5200   Primary Controller:  Replace back up battery in 32 months. Back up controller:   pt left in car   Annual Equipment Maintenance on UBC/PM was performed on 03/08/23.    I reviewed the LVAD parameters from today and compared the results to the patient's prior recorded data. LVAD interrogation was  NEGATIVE for significant power changes, NEGATIVE for clinical alarms and STABLE for PI events/speed drops. No programming changes were made and pump is functioning within specified parameters. Pt is performing daily controller and system monitor self tests along with completing weekly and monthly maintenance for LVAD equipment.   LVAD equipment check completed and is in good working order. Back-up equipment present. Charged back up battery and performed self-test on equipment.    Exit Site Care: Drive line is being maintained twice a week by VAD coordinators. Existing VAD dressing removed and site care performed using sterile technique. Drive line exit site cleaned with Chlora prep applicators x 2, allowed to dry, and gauze dressing applied. Exit site healed and incorporated, the velour is exposed approx 1" at exit site. No redness, tenderness, drainage, foul odor or rash noted. Drive line anchor re-applied. Pt denies fever or chills. Pt will return in 1 week, if no drainage can advance pt to weekly dressing using weekly kit. Pt states they have adequate dressing supplies at home.   Past Medical History:  Diagnosis Date   Acute MI, anterior wall (HCC)    AICD (automatic cardioverter/defibrillator) present    CAD (coronary artery disease)    2D ECHO, 07/13/2011 - EF <25%, LV moderatelty dilated, LA moderately dilatedLEXISCAN, 12/14/2011 - moderate-severe perfusion defect seen in the basal anteroseptal, mid anterior, apicacl anterior, apical, apical inferior, and apical lateral regions, post-stress EF 25%, new EKG changes from baseline abnormalities   Cancer (HCC)  Prostate   CHF (congestive heart failure) (HCC) 2012   Hypertension 08/08/2021   Inguinal hernia, left    Pneumonia    November 2023   Pre-diabetes     Current Outpatient Medications  Medication Sig Dispense Refill   atorvastatin (LIPITOR) 80 MG tablet Take 1 tablet (80 mg total) by mouth daily. 30 tablet 0   docusate sodium  (COLACE) 100 MG capsule Take 2 capsules (200 mg total) by mouth daily. 100 capsule 0   finasteride (PROSCAR) 5 MG tablet Take 1 tablet (5 mg total) by mouth daily. 30 tablet 0   Multiple Vitamins-Minerals (CERTAVITE/ANTIOXIDANTS) TABS Take 1 tablet by mouth daily. 130 tablet 0   pantoprazole (PROTONIX) 40 MG tablet Take 1 tablet (40 mg total) by mouth daily. 30 tablet 0   polyethylene glycol powder (GLYCOLAX/MIRALAX) 17 GM/SCOOP powder Take 17 g by mouth daily. 476 g 0   sacubitril-valsartan (ENTRESTO) 24-26 MG Take 1 tablet by mouth 2 (two) times daily. 60 tablet 3   tamsulosin (FLOMAX) 0.4 MG CAPS capsule Take 1 capsule (0.4 mg total) by mouth daily after supper. 30 capsule 0   warfarin (COUMADIN) 2.5 MG tablet Take one tablet by mouth on Mon, Wed, Fri, Sat and Sunday with supper. Take 2 tablets on Tue and Thur with supper. (Patient taking differently: Take one tablet by mouth on Mon, Wed, Fri, Sat and Sunday with supper. Take 2 tablets on Tue and Thur with supper. Pt has only been taking 1 tablet daily and then on Sunday he took 1/2 tablet) 50 tablet 0   amiodarone (PACERONE) 200 MG tablet Take 1 tablet (200 mg total) by mouth daily. 60 tablet 0   torsemide (DEMADEX) 20 MG tablet Take 1 tablet (20 mg total) by mouth daily as needed. (Patient not taking: Reported on 04/03/2023) 30 tablet 0   traMADol (ULTRAM) 50 MG tablet Take 1 tablet (50 mg total) by mouth every 4 (four) hours as needed for severe pain. (Patient not taking: Reported on 04/03/2023) 35 tablet 0   traZODone (DESYREL) 50 MG tablet Take 1 tablet (50 mg total) by mouth at bedtime as needed for sleep. (Patient not taking: Reported on 04/03/2023) 30 tablet 0   No current facility-administered medications for this encounter.    Patient has no known allergies.  REVIEW OF SYSTEMS: All systems negative except as listed in HPI, PMH and Problem list.  Vital Signs:  Doppler Pressure 104              Automatc BP: 117/103 (109) HR: 80    SPO2:UTO  %   Weight: 150.2 lb w/o eqt Last weight: 150 lb Home weights: 139-150 lbs    Vitals:   04/03/23 1219 04/03/23 1220  BP: (!) 117/103 (!) 104/0  Pulse: 80   Weight: 68.1 kg (150 lb 3.2 oz)   Height: 5\' 10"  (1.778 m)     Physical Exam: General:  NAD.  HEENT: normal  Neck: supple. JVP not elevated.  Carotids 2+ bilat; no bruits. No lymphadenopathy or thryomegaly appreciated. Cor: LVAD hum.  Lungs: Clear. Abdomen: soft, nontender, non-distended. No hepatosplenomegaly. No bruits or masses. Good bowel sounds. Driveline site clean. Anchor in place.  Extremities: no cyanosis, clubbing, rash. Warm no edema  Neuro: alert & oriented x 3. No focal deficits. Moves all 4 without problem    ASSESSMENT AND PLAN:    1. Chronic combined systolic and diastolic heart failure - Due to Icm CM with anterior MI in 2012 - Echo 02/27/23:  EF <20%, LV with GHK, RV mildly reduced, GIIDD, LA mod dilated, mild MR.  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 GDMT limited by renal function and hypotension.  - S/P HM3 LVAD implant 03/08/23.   - Doing well NYHA II  - Volume stable - MAPs up. Start Entresto 24/26 bid   2. VAD - VAD interrogated personally. Parameters stable. - LDH 280 - DL ok - MAPs up -> start Entresto  - INR 1.8 Goal 2.0-3.0 Discussed warfarin dosing with PharmD personally. - Stressed need to make sure DL site stabilized  3. 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  -  No s/s angina.  - off asa as above.  - Continue statin    4. CKD stage 3: Creatinine baseline 1.6-1.8 - cardiorenal, resolved w/ LVAD support - Scr 1.2 today    5. H/o VT s/p Medtronic ICD: likely scar mediated - Continue amiodarone for now   6. Paroxysmal Atrial fibrillation:  - Decrease amio to 200 daily. Wean off at next visit  7. Mild protein calorie malnutrition - given Ensure  I spent a total of 40 minutes today: 1) reviewing the patient's medical records including  previous charts, labs and recent notes from other providers; 2) examining the patient and counseling them on their medical issues/explaining the plan of care; 3) adjusting meds as needed and 4) ordering lab work or other needed tests.    Arvilla Meres, MD  5:01 PM

## 2023-04-06 ENCOUNTER — Ambulatory Visit: Payer: 59 | Admitting: Student

## 2023-04-09 ENCOUNTER — Other Ambulatory Visit (HOSPITAL_COMMUNITY): Payer: Self-pay | Admitting: Unknown Physician Specialty

## 2023-04-09 DIAGNOSIS — Z7901 Long term (current) use of anticoagulants: Secondary | ICD-10-CM

## 2023-04-09 DIAGNOSIS — Z95811 Presence of heart assist device: Secondary | ICD-10-CM

## 2023-04-10 ENCOUNTER — Ambulatory Visit (HOSPITAL_COMMUNITY): Payer: Self-pay | Admitting: Pharmacist

## 2023-04-10 ENCOUNTER — Ambulatory Visit (HOSPITAL_COMMUNITY)
Admission: RE | Admit: 2023-04-10 | Discharge: 2023-04-10 | Disposition: A | Discharge status: Home / Self Care | Payer: 59 | Source: Ambulatory Visit | Attending: Cardiology | Admitting: Cardiology

## 2023-04-10 DIAGNOSIS — Z95811 Presence of heart assist device: Secondary | ICD-10-CM | POA: Insufficient documentation

## 2023-04-10 DIAGNOSIS — Z7901 Long term (current) use of anticoagulants: Secondary | ICD-10-CM | POA: Insufficient documentation

## 2023-04-10 DIAGNOSIS — Z4801 Encounter for change or removal of surgical wound dressing: Secondary | ICD-10-CM | POA: Insufficient documentation

## 2023-04-10 LAB — PROTIME-INR
INR: 2.5 — ABNORMAL HIGH (ref 0.8–1.2)
Prothrombin Time: 27.1 s — ABNORMAL HIGH (ref 11.4–15.2)

## 2023-04-10 NOTE — Progress Notes (Signed)
Pt presented to VAD clinic for drive line dressing change and INR. Pt denies tenderness/pain at exit site.   Pt tells me that he is doing well at home and eating really good. He is thankful and appreciative for the VAD shirt and chocolate boost.    Exit Site Care: Drive line was change 1 week ago by VAD coordinators. Existing daily VAD dressing removed and site care performed using sterile technique. Drive line exit site cleaned with Chlora prep applicators x 2, allowed to dry, and Silverlon patch applied with Sorbaview dressing. Exit site healed and partially incorporated, the velour is exposed approx 1" at exit site. No redness, tenderness, drainage, foul odor or rash noted. Drive line anchor re-applied. Pt denies fever or chills. Pt will return in 1 week, if no issues we can discuss giving pt a shower bag and showering instructions at his visit next week. Pt given 5 anchors for home use.    Plan: Return to clinic in 1 week for a full visit with Dr Gala Romney Coumadin dosing per Lauren pharm-D  Carlton Adam RN, BSN VAD Coordinator 24/7 Pager [phone]

## 2023-04-16 ENCOUNTER — Encounter (HOSPITAL_COMMUNITY): Payer: 59 | Admitting: Internal Medicine

## 2023-04-17 ENCOUNTER — Other Ambulatory Visit (HOSPITAL_COMMUNITY): Payer: Self-pay | Admitting: Unknown Physician Specialty

## 2023-04-17 DIAGNOSIS — Z7901 Long term (current) use of anticoagulants: Secondary | ICD-10-CM

## 2023-04-17 DIAGNOSIS — Z95811 Presence of heart assist device: Secondary | ICD-10-CM

## 2023-04-18 ENCOUNTER — Other Ambulatory Visit (HOSPITAL_COMMUNITY): Payer: Self-pay | Admitting: *Deleted

## 2023-04-18 ENCOUNTER — Telehealth (HOSPITAL_COMMUNITY): Payer: Self-pay | Admitting: *Deleted

## 2023-04-18 ENCOUNTER — Encounter (HOSPITAL_COMMUNITY): Payer: Self-pay | Admitting: Internal Medicine

## 2023-04-18 ENCOUNTER — Ambulatory Visit (HOSPITAL_COMMUNITY): Payer: Self-pay | Admitting: Pharmacist

## 2023-04-18 ENCOUNTER — Ambulatory Visit (HOSPITAL_COMMUNITY)
Admission: RE | Admit: 2023-04-18 | Discharge: 2023-04-18 | Disposition: A | Discharge status: Home / Self Care | Payer: 59 | Source: Ambulatory Visit | Attending: Internal Medicine | Admitting: Internal Medicine

## 2023-04-18 VITALS — BP 90/75 | HR 80 | Wt 154.2 lb

## 2023-04-18 DIAGNOSIS — I472 Ventricular tachycardia, unspecified: Secondary | ICD-10-CM

## 2023-04-18 DIAGNOSIS — Z95811 Presence of heart assist device: Secondary | ICD-10-CM | POA: Diagnosis not present

## 2023-04-18 DIAGNOSIS — I2510 Atherosclerotic heart disease of native coronary artery without angina pectoris: Secondary | ICD-10-CM

## 2023-04-18 DIAGNOSIS — I5022 Chronic systolic (congestive) heart failure: Secondary | ICD-10-CM

## 2023-04-18 DIAGNOSIS — I10 Essential (primary) hypertension: Secondary | ICD-10-CM

## 2023-04-18 DIAGNOSIS — Z4801 Encounter for change or removal of surgical wound dressing: Secondary | ICD-10-CM | POA: Insufficient documentation

## 2023-04-18 DIAGNOSIS — Z7901 Long term (current) use of anticoagulants: Secondary | ICD-10-CM | POA: Insufficient documentation

## 2023-04-18 LAB — BASIC METABOLIC PANEL
Anion gap: 7 (ref 5–15)
BUN: 11 mg/dL (ref 8–23)
CO2: 23 mmol/L (ref 22–32)
Calcium: 9.2 mg/dL (ref 8.9–10.3)
Chloride: 108 mmol/L (ref 98–111)
Creatinine, Ser: 1.19 mg/dL (ref 0.61–1.24)
GFR, Estimated: >60 mL/min (ref >60)
Glucose, Bld: 134 mg/dL — ABNORMAL HIGH (ref 70–99)
Potassium: 4 mmol/L (ref 3.5–5.1)
Sodium: 138 mmol/L (ref 135–145)

## 2023-04-18 LAB — CBC
HCT: 37 % — ABNORMAL LOW (ref 39.0–52.0)
Hemoglobin: 11.4 g/dL — ABNORMAL LOW (ref 13.0–17.0)
MCH: 28.9 pg (ref 26.0–34.0)
MCHC: 30.8 g/dL (ref 30.0–36.0)
MCV: 93.9 fL (ref 80.0–100.0)
Platelets: 224 10*3/uL (ref 150–400)
RBC: 3.94 MIL/uL — ABNORMAL LOW (ref 4.22–5.81)
RDW: 16.8 % — ABNORMAL HIGH (ref 11.5–15.5)
WBC: 9 10*3/uL (ref 4.0–10.5)
nRBC: 0 % (ref 0.0–0.2)

## 2023-04-18 LAB — T4, FREE: Free T4: 1.45 ng/dL — ABNORMAL HIGH (ref 0.61–1.12)

## 2023-04-18 LAB — LACTATE DEHYDROGENASE: LDH: 226 U/L — ABNORMAL HIGH (ref 98–192)

## 2023-04-18 LAB — TSH: TSH: 1.361 u[IU]/mL (ref 0.350–4.500)

## 2023-04-18 LAB — PROTIME-INR
INR: 3 — ABNORMAL HIGH (ref 0.8–1.2)
Prothrombin Time: 31.1 s — ABNORMAL HIGH (ref 11.4–15.2)

## 2023-04-18 NOTE — Telephone Encounter (Signed)
Spoke with patient regarding follow up appt information. Appt scheduled Tuesday 05/01/23 at 11:00. Will obtain RAMP echo at this appt. He verbalized understanding of appt information.   Alyce Pagan RN VAD Coordinator  Office: [phone]  24/7 Pager: [phone]

## 2023-04-18 NOTE — Patient Instructions (Addendum)
Stop Amiodarone today Coumadin dosing per Lauren PharmD Return to clinic in 1 week for INR & dressing change Return to clinic in 2 weeks with a RAMP echo-we will call you with appt information

## 2023-04-18 NOTE — Progress Notes (Signed)
Patient presents for 2 week f/u follow up in VAD Clinic today with daughter. Reports no problems with VAD equipment or concerns with drive line.   Pt tells me that he has been doing well at home. He is having PT/OT at home but thinks this will be done on Friday. He walked into clinic today unassisted. He tells me that he is ready to get busy, start driving and get back to work. Pt informed that he will need to wait 2 more weeks before he can drive per Dr Gala Romney as he is only 6 weeks out from implant.  Denies dizziness, falls, shortness of breath, heart failure symptoms, and signs of bleeding. Speed increased to 5600 last visit. Reports increased lightheadedness with standing since starting Entresto/speed increase last visit. He is drinking less than 2 L daily. 100 + PI events so far today (increased from 20-40 daily last visit). Advised to increase PO intake to at least 2 L daily. He verbalized understanding. Will obtain RAMP echo at next visit per Dr Gala Romney.   Decreased Amiodarone to 200 mg daily as instructed last visit. Will stop today per Dr Gala Romney. Pt verbalized understanding. Advised to fill pill box using today's AVS medication list. He verbalized understanding.   Pt does not wish to learn about showering today. States he may be interested at his next visit.   Pt survey completed.    Vital Signs:  Doppler Pressure: 88   Automatc BP: 90/75 (82) HR: 80   SPO2: UTO %   Weight: 154.2 lb w/o eqt Last weight: 150.2 lb Home weights: 139-150 lbs-- did not bring red folder   VAD Indication: Destination Therapy - pt preference, social issues   VAD interrogation & Equipment Management: Speed: 5600 Flow: 4.5 Power: 4.2 w    PI: 4.4   Alarms: no clinical alarms Events: 100+ daily  Fixed speed 5600 Low speed limit: 5300   Primary Controller:  Replace back up battery in 32 months. Back up controller:   pt left at home- advised again to bring backup bag with him to clinic  visits and when he is outside his home.    Annual Equipment Maintenance on UBC/PM was performed on 03/08/23.    I reviewed the LVAD parameters from today and compared the results to the patient's prior recorded data. LVAD interrogation was NEGATIVE for significant power changes, NEGATIVE for clinical alarms and STABLE for PI events/speed drops. No programming changes were made and pump is functioning within specified parameters. Pt is performing daily controller and system monitor self tests along with completing weekly and monthly maintenance for LVAD equipment.   LVAD equipment check completed and is in good working order. Back-up equipment NOT present. Charged back up battery and performed self-test on equipment.    Exit Site Care: Drive line is being maintained once a week by VAD coordinators. Existing VAD dressing removed and site care performed using sterile technique. Drive line exit site cleaned with Chlora prep applicators x 2, allowed to dry, and Sorbaview dressing with Silverlon patch applied. Exit site healed and incorporated, the velour is exposed approx 1" at exit site. No redness, tenderness, drainage, foul odor or rash noted. Drive line anchor re-applied. Pt denies fever or chills. Pt states they have adequate dressing supplies at home.    Significant Events on VAD Support:    Device: Medtronic Therapies: on VF 200 BPM Last check: 03/14/23   BP & Labs:  MAP 88 - Doppler is reflecting MAP   Hgb  11.4 - No S/S of bleeding. Specifically denies melena/BRBPR or nosebleeds.   LDH stable at 226 with established baseline of 230-290. Denies tea-colored urine. No power elevations noted on interrogation.   Plan: Stop Amiodarone today Coumadin dosing per Lauren PharmD Return to clinic in 1 week for INR & dressing change Return to clinic in 2 weeks with a RAMP echo   Alyce Pagan RN VAD Coordinator  Office: [phone]  24/7 Pager: [phone]

## 2023-04-18 NOTE — Progress Notes (Signed)
LVAD INR 

## 2023-04-19 ENCOUNTER — Other Ambulatory Visit: Payer: Self-pay | Admitting: Physical Medicine and Rehabilitation

## 2023-04-19 LAB — T3, FREE: T3, Free: 2.1 pg/mL (ref 2.0–4.4)

## 2023-04-20 ENCOUNTER — Other Ambulatory Visit (HOSPITAL_COMMUNITY): Payer: Self-pay | Admitting: *Deleted

## 2023-04-20 ENCOUNTER — Other Ambulatory Visit: Payer: Self-pay

## 2023-04-20 DIAGNOSIS — Z95811 Presence of heart assist device: Secondary | ICD-10-CM

## 2023-04-20 DIAGNOSIS — Z7901 Long term (current) use of anticoagulants: Secondary | ICD-10-CM

## 2023-04-20 NOTE — Progress Notes (Addendum)
LVAD CLINIC NOTE  PCP: [name], MD HF doc: DB   HPI:  Steven [name] is a 67 y.o. 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 Underwent HM-3 VAD placement 0n 03/08/23   Originally from [place], suffered a large out of hospital anterior wall myocardial infarction January 2012.  He presented after a three-hour delay to the hospital and was taken emergently for cath with DES to LAD. EF was 35-40% at time of MI. Unfortunately, did not have recovery of EF. C/w progressive decline in systolic function over the years. Ultimately underwent ICD.    Admitted on 02/26/23 w/ NYHA Class IV symptoms and low output. Echo showed EF < 20%, RV mildly reduced. RHC showed with low output. CI 2.1. Calcified pericardium (no H/o TB). Underwent HMIII LVAD implant on 03/08/23. Intra-op TEE LVEF 15%, RV normal.  Post op course was c/b afib w/ RVR, requiring IV amiodarone. Converted back to NSR.  Here for f/u. Says he is doing much better and getting stronger. Walking independently and doing all ADLs. Entresto added at last visit and notes he gets lightheaded when standing. Not drinking much fluid. Denies orthopnea or PND. No fevers, chills or problems with driveline. No bleeding, melena or neuro symptoms. No VAD alarms. Taking all meds as prescribed.     VAD Indication: Destination Therapy - pt preference, social issues   VAD interrogation & Equipment Management: Speed: 5600 Flow: 4.5 Power: 4.2 w    PI: 4.4   Alarms: no clinical alarms Events: 100+ daily  Fixed speed 5600 Low speed limit: 5300   Primary Controller:  Replace back up battery in 32 months. Back up controller:   pt left at home- advised again to bring backup bag with him to clinic visits and when he is outside his home.    Annual Equipment Maintenance on UBC/PM was performed on 03/08/23.    I reviewed the LVAD  parameters from today and compared the results to the patient's prior recorded data. LVAD interrogation was NEGATIVE for significant power changes, NEGATIVE for clinical alarms and STABLE for PI events/speed drops. No programming changes were made and pump is functioning within specified parameters. Pt is performing daily controller and system monitor self tests along with completing weekly and monthly maintenance for LVAD equipment.   LVAD equipment check completed and is in good working order. Back-up equipment NOT present. Charged back up battery and performed self-test on equipment.   Past Medical History:  Diagnosis Date   Acute MI, anterior wall (HCC)    AICD (automatic cardioverter/defibrillator) present    CAD (coronary artery disease)    2D ECHO, 07/13/2011 - EF <25%, LV moderatelty dilated, LA moderately dilatedLEXISCAN, 12/14/2011 - moderate-severe perfusion defect seen in the basal anteroseptal, mid anterior, apicacl anterior, apical, apical inferior, and apical lateral regions, post-stress EF 25%, new EKG changes from baseline abnormalities   Cancer [facility])    Prostate   CHF (congestive heart failure) (HCC) 2012   Hypertension 08/08/2021   Inguinal hernia, left    Pneumonia    November 2023   Pre-diabetes     Current Outpatient Medications  Medication Sig Dispense Refill   atorvastatin (LIPITOR) 80 MG tablet Take 1 tablet (80 mg total) by mouth daily. 30 tablet 0   finasteride (PROSCAR) 5 MG tablet Take 1 tablet (5 mg total) by mouth daily. 30 tablet 0  Multiple Vitamins-Minerals (CERTAVITE/ANTIOXIDANTS) TABS Take 1 tablet by mouth daily. 130 tablet 0   pantoprazole (PROTONIX) 40 MG tablet Take 1 tablet (40 mg total) by mouth daily. 30 tablet 0   sacubitril-valsartan (ENTRESTO) 24-26 MG Take 1 tablet by mouth 2 (two) times daily. 60 tablet 3   tamsulosin (FLOMAX) 0.4 MG CAPS capsule Take 1 capsule (0.4 mg total) by mouth daily after supper. (Patient taking differently: Take 0.4  mg by mouth daily after supper.) 30 capsule 0   traZODone (DESYREL) 50 MG tablet Take 1 tablet (50 mg total) by mouth at bedtime as needed for sleep. 30 tablet 0   warfarin (COUMADIN) 2.5 MG tablet Take one tablet by mouth on Mon, Wed, Fri, Sat and Sunday with supper. Take 2 tablets on Tue and Thur with supper. (Patient taking differently: Take one tablet by mouth on Mon, Wed, Fri, Sat and Sunday with supper. Take 2 tablets on Tue and Thur with supper.) 50 tablet 0   docusate sodium (COLACE) 100 MG capsule Take 2 capsules (200 mg total) by mouth daily. (Patient not taking: Reported on 04/18/2023) 100 capsule 0   polyethylene glycol powder (GLYCOLAX/MIRALAX) 17 GM/SCOOP powder Take 17 g by mouth daily. (Patient not taking: Reported on 04/18/2023) 476 g 0   torsemide (DEMADEX) 20 MG tablet Take 1 tablet (20 mg total) by mouth daily as needed. (Patient not taking: Reported on 04/03/2023) 30 tablet 0   traMADol (ULTRAM) 50 MG tablet Take 1 tablet (50 mg total) by mouth every 4 (four) hours as needed for severe pain. (Patient not taking: Reported on 04/03/2023) 35 tablet 0   No current facility-administered medications for this encounter.    Patient has no known allergies.  REVIEW OF SYSTEMS: All systems negative except as listed in HPI, PMH and Problem list.     Vital Signs:  Doppler Pressure: 88               Automatc BP: 90/75 (82) HR: 80   SPO2: UTO %   Weight: 154.2 lb w/o eqt Last weight: 150.2 lb Home weights: 139-150 lbs-- did not bring red folder    Vitals:   04/18/23 1142 04/18/23 1143  BP: (!) 88/0 90/75  Pulse: 80   Weight: 69.9 kg (154 lb 3.2 oz)     Physical Exam: General:  NAD. Thin HEENT: normal  Neck: supple. JVP not elevated.  Carotids 2+ bilat; no bruits. No lymphadenopathy or thryomegaly appreciated. Cor: LVAD hum.  Lungs: Clear. Abdomen: soft, nontender, non-distended. No hepatosplenomegaly. No bruits or masses. Good bowel sounds. Driveline site clean. Anchor in  place.  Extremities: no cyanosis, clubbing, rash. Warm no edema  Neuro: alert & oriented x 3. No focal deficits. Moves all 4 without problem    ASSESSMENT AND PLAN:    1. Chronic combined systolic and diastolic heart failure - Due to Icm CM with anterior MI in 2012 - Echo 02/27/23: EF <20%, LV with GHK, RV mildly reduced, GIIDD, LA mod dilated, mild MR.  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 GDMT limited by renal function and hypotension.  - S/P HM3 LVAD implant 03/08/23.   - Continues to improve NYHA II  - Volume on low end. Encouraged po intake - MAPs improved. Continue Entresto 24/26 bid   2. VAD - VAD interrogated personally. Parameters stable. Having frequent PI events. Encouraged po intake. Can do ramp study as needed - LDH 226 - DL ok - MAPs improved - INR 3.0 Goal 2.0-3.0 Discussed  warfarin dosing with PharmD personally.  3. 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  -  No s/s angina.  - off asa as above.  - Continue statin    4. CKD stage 3: Creatinine baseline 1.6-1.8 - cardiorenal, resolved w/ LVAD support - Scr 1.2 today    5. H/o VT s/p Medtronic ICD: likely scar mediated - Quiescent. Stop amio    6. Paroxysmal Atrial fibrillation:  - Stop amio  7. Mild protein calorie malnutrition - continue Ensure  I spent a total of 45 minutes today: 1) reviewing the patient's medical records including previous charts, labs and recent notes from other providers; 2) examining the patient and counseling them on their medical issues/explaining the plan of care; 3) adjusting meds as needed and 4) ordering lab work or other needed tests.    Arvilla Meres, MD  8:53 AM

## 2023-04-23 ENCOUNTER — Other Ambulatory Visit (HOSPITAL_COMMUNITY): Payer: Self-pay

## 2023-04-23 ENCOUNTER — Other Ambulatory Visit: Payer: Self-pay

## 2023-04-25 ENCOUNTER — Other Ambulatory Visit: Payer: Self-pay

## 2023-04-25 ENCOUNTER — Other Ambulatory Visit (HOSPITAL_COMMUNITY): Payer: Self-pay

## 2023-04-25 ENCOUNTER — Ambulatory Visit (HOSPITAL_COMMUNITY): Payer: Self-pay | Admitting: Pharmacist

## 2023-04-25 ENCOUNTER — Ambulatory Visit (HOSPITAL_COMMUNITY)
Admission: RE | Admit: 2023-04-25 | Discharge: 2023-04-25 | Disposition: A | Discharge status: Home / Self Care | Payer: 59 | Source: Ambulatory Visit | Attending: Internal Medicine | Admitting: Internal Medicine

## 2023-04-25 DIAGNOSIS — Z4509 Encounter for adjustment and management of other cardiac device: Secondary | ICD-10-CM | POA: Insufficient documentation

## 2023-04-25 DIAGNOSIS — Z7901 Long term (current) use of anticoagulants: Secondary | ICD-10-CM | POA: Insufficient documentation

## 2023-04-25 DIAGNOSIS — Z95811 Presence of heart assist device: Secondary | ICD-10-CM | POA: Diagnosis not present

## 2023-04-25 LAB — PROTIME-INR
INR: 2.2 — ABNORMAL HIGH (ref 0.8–1.2)
Prothrombin Time: 24.9 s — ABNORMAL HIGH (ref 11.4–15.2)

## 2023-04-25 MED ORDER — FINASTERIDE 5 MG PO TABS
5.0000 mg | ORAL_TABLET | Freq: Every day | ORAL | 6 refills | Status: DC
  Filled 2023-04-25: qty 30, 30d supply, fill #0
  Filled 2023-05-21: qty 30, 30d supply, fill #1
  Filled 2023-06-19: qty 30, 30d supply, fill #2
  Filled 2023-07-19: qty 30, 30d supply, fill #3
  Filled 2023-08-15: qty 30, 30d supply, fill #4
  Filled 2023-09-14: qty 30, 30d supply, fill #5
  Filled 2023-10-12: qty 30, 30d supply, fill #6

## 2023-04-25 MED ORDER — PANTOPRAZOLE SODIUM 40 MG PO TBEC
40.0000 mg | DELAYED_RELEASE_TABLET | Freq: Every day | ORAL | 6 refills | Status: DC
  Filled 2023-04-25: qty 30, 30d supply, fill #0
  Filled 2023-05-21: qty 30, 30d supply, fill #1
  Filled 2023-06-19: qty 30, 30d supply, fill #2
  Filled 2023-07-19: qty 30, 30d supply, fill #3
  Filled 2023-08-15: qty 30, 30d supply, fill #4
  Filled 2023-09-14: qty 30, 30d supply, fill #5
  Filled 2023-10-12: qty 30, 30d supply, fill #6

## 2023-04-25 NOTE — Progress Notes (Signed)
Pt presented to VAD clinic for drive line dressing change and INR. Reports no problems with VAD equipment or concerns with drive line   Exit Site Care: Drive line is being maintained once a week by VAD coordinators. Existing VAD dressing removed and site care performed using sterile technique. Drive line exit site cleaned with Chlora prep applicators x 2, allowed to dry, and Sorbaview dressing with Silverlon patch applied. Exit site healed and incorporated, the velour is exposed approx 1" at exit site. No redness, tenderness, drainage, foul odor or rash noted. Drive line anchor re-applied. Pt denies fever or chills. Pt states they have adequate dressing supplies at home. Will assess readiness to shower next visit.  Pt states he has continued to increase hydration since last appointment and continues to have dizziness. Pt states he fell Monday but did not sustain any injuries. Advised for pt to ensure he does not stand too quickly to make sure he takes his time getting up. Pt scheduled for full visit with Dr. Gala Romney next Tuesday with RAMP echo.  Pt states he plans to go to [place] next Friday. Pt advised he must take all equipment and given information for closest VAD Centers below.    Plan: Return to VAD Clinic next Tuesday for 2 weeks f/u with RAMP echo Coumadin dosing per Clovis Riley RN,BSN VAD Coordinator  Office: [phone]  24/7 Pager: [phone]

## 2023-04-26 ENCOUNTER — Other Ambulatory Visit (HOSPITAL_COMMUNITY): Payer: Self-pay

## 2023-04-30 ENCOUNTER — Other Ambulatory Visit (HOSPITAL_COMMUNITY): Payer: Self-pay | Admitting: Unknown Physician Specialty

## 2023-04-30 DIAGNOSIS — Z7901 Long term (current) use of anticoagulants: Secondary | ICD-10-CM

## 2023-04-30 DIAGNOSIS — Z95811 Presence of heart assist device: Secondary | ICD-10-CM

## 2023-05-01 ENCOUNTER — Ambulatory Visit (HOSPITAL_COMMUNITY)
Admission: RE | Admit: 2023-05-01 | Discharge: 2023-05-01 | Disposition: A | Discharge status: Home / Self Care | Payer: 59 | Source: Ambulatory Visit | Attending: Internal Medicine | Admitting: Internal Medicine

## 2023-05-01 ENCOUNTER — Ambulatory Visit (HOSPITAL_BASED_OUTPATIENT_CLINIC_OR_DEPARTMENT_OTHER)
Admission: RE | Admit: 2023-05-01 | Discharge: 2023-05-01 | Disposition: A | Discharge status: Home / Self Care | Payer: 59 | Source: Ambulatory Visit | Attending: Internal Medicine | Admitting: Internal Medicine

## 2023-05-01 ENCOUNTER — Ambulatory Visit (HOSPITAL_COMMUNITY): Payer: Self-pay | Admitting: Pharmacist

## 2023-05-01 VITALS — BP 100/0 | HR 81 | Ht 70.0 in | Wt 155.2 lb

## 2023-05-01 DIAGNOSIS — I472 Ventricular tachycardia, unspecified: Secondary | ICD-10-CM

## 2023-05-01 DIAGNOSIS — I252 Old myocardial infarction: Secondary | ICD-10-CM | POA: Insufficient documentation

## 2023-05-01 DIAGNOSIS — I110 Hypertensive heart disease with heart failure: Secondary | ICD-10-CM | POA: Diagnosis not present

## 2023-05-01 DIAGNOSIS — Z7901 Long term (current) use of anticoagulants: Secondary | ICD-10-CM | POA: Insufficient documentation

## 2023-05-01 DIAGNOSIS — I5022 Chronic systolic (congestive) heart failure: Secondary | ICD-10-CM | POA: Diagnosis present

## 2023-05-01 DIAGNOSIS — I2510 Atherosclerotic heart disease of native coronary artery without angina pectoris: Secondary | ICD-10-CM | POA: Insufficient documentation

## 2023-05-01 DIAGNOSIS — I10 Essential (primary) hypertension: Secondary | ICD-10-CM

## 2023-05-01 DIAGNOSIS — Z95811 Presence of heart assist device: Secondary | ICD-10-CM | POA: Insufficient documentation

## 2023-05-01 LAB — CBC
HCT: 38.8 % — ABNORMAL LOW (ref 39.0–52.0)
Hemoglobin: 12.3 g/dL — ABNORMAL LOW (ref 13.0–17.0)
MCH: 29.8 pg (ref 26.0–34.0)
MCHC: 31.7 g/dL (ref 30.0–36.0)
MCV: 93.9 fL (ref 80.0–100.0)
Platelets: 184 10*3/uL (ref 150–400)
RBC: 4.13 MIL/uL — ABNORMAL LOW (ref 4.22–5.81)
RDW: 15.7 % — ABNORMAL HIGH (ref 11.5–15.5)
WBC: 7.1 10*3/uL (ref 4.0–10.5)
nRBC: 0 % (ref 0.0–0.2)

## 2023-05-01 LAB — BASIC METABOLIC PANEL
Anion gap: 7 (ref 5–15)
BUN: 14 mg/dL (ref 8–23)
CO2: 24 mmol/L (ref 22–32)
Calcium: 9.1 mg/dL (ref 8.9–10.3)
Chloride: 106 mmol/L (ref 98–111)
Creatinine, Ser: 1.27 mg/dL — ABNORMAL HIGH (ref 0.61–1.24)
GFR, Estimated: >60 mL/min (ref >60)
Glucose, Bld: 166 mg/dL — ABNORMAL HIGH (ref 70–99)
Potassium: 4.1 mmol/L (ref 3.5–5.1)
Sodium: 137 mmol/L (ref 135–145)

## 2023-05-01 LAB — LACTATE DEHYDROGENASE: LDH: 222 U/L — ABNORMAL HIGH (ref 98–192)

## 2023-05-01 LAB — PROTIME-INR
INR: 2.4 — ABNORMAL HIGH (ref 0.8–1.2)
Prothrombin Time: 26.3 s — ABNORMAL HIGH (ref 11.4–15.2)

## 2023-05-01 LAB — ECHOCARDIOGRAM LIMITED: Est EF: <20

## 2023-05-01 NOTE — Progress Notes (Signed)
Patient presents for 2 week follow up in VAD Clinic today alone. Reports no problems with VAD equipment or concerns with drive line.   Pt tells me that he has been doing well at home. He drove himself here today. He walked into clinic today unassisted. He tells me that he is ready to get busy, and get back to work.   Denies shortness of breath, heart failure symptoms, and signs of bleeding. He does tell me that he has been very dizzy since starting the [facility]. He also states that he fell last week from being lightheaded. Will stop Entresto today per Dr Gala Romney. Speed increased to 5600 last visit. RAMP done today. See separate note for details. No changes made in speed today.  Pt was given instructions on showering and shower bag today. Pt was instructed to clean his shower head with bleach prior to every shower. Pt was instructed to shower the day before his appt with [place] or the day of his appt with [place]. This is so that if his dressing gets wet we can change it in clinic.    Vital Signs:  Doppler Pressure: 100   Automatc BP: 103/87 (93) HR: 81  SPO2: 100 %   Weight: 155.2 lb w/o eqt Last weight: 154.2 lb Home weights: 139-150 lbs-- did not bring red folder   VAD Indication: Destination Therapy - pt preference, social issues   VAD interrogation & Equipment Management: Speed: 5600 Flow: 4.5 Power: 4.3 w    PI: 3.1   Alarms: no clinical alarms Events: 100+ daily  Fixed speed 5600 Low speed limit: 5300   Primary Controller:  Replace back up battery in 31 months. Back up controller:   pt left at home- advised again to bring backup bag with him to clinic visits and when he is outside his home.    Annual Equipment Maintenance on UBC/PM was performed on 03/08/23.    I reviewed the LVAD parameters from today and compared the results to the patient's prior recorded data. LVAD interrogation was NEGATIVE for significant power changes, NEGATIVE for clinical alarms and STABLE for PI  events/speed drops. No programming changes were made and pump is functioning within specified parameters. Pt is performing daily controller and system monitor self tests along with completing weekly and monthly maintenance for LVAD equipment.   LVAD equipment check completed and is in good working order. Back-up equipment NOT present. Charged back up battery and performed self-test on equipment.    Exit Site Care: Drive line is being maintained once a week by VAD coordinators. Existing VAD dressing removed and site care performed using sterile technique. Drive line exit site cleaned with Chlora prep applicators x 2, allowed to dry, and Sorbaview dressing with Silverlon patch applied. 1 large tegaderm placed over sorbaview. Exit site healed and incorporated, the velour is exposed approx 1" at exit site. No redness, tenderness, drainage, foul odor or rash noted. Drive line anchor re-applied. Pt denies fever or chills. Pt states they have adequate dressing supplies at home. Pt ok to shower. Taught how to use shower bag.   Significant Events on VAD Support:    Device: Medtronic Therapies: on VF 200 BPM Last check: 03/14/23   BP & Labs:  MAP 100 - Doppler is reflecting Modified systolic   Hgb 11.4 - No S/S of bleeding. Specifically denies melena/BRBPR or nosebleeds.   LDH stable at 226 with established baseline of 230-290. Denies tea-colored urine. No power elevations noted on interrogation.   Plan:  Stop Sherryll Burger Return to clinic Tuesday for dressing change Return to clinic in 1 month to see DR Bensimhon You may shower the day before or day that you come see [place] next week. Please clean your showerhead with bleach prior to showering each time.   Provided patient with shower bag for home use. Demonstration along with written instructions and illustrated steps provided.  Patient and caregiver verbalized understanding of same.    Here are some tips for washing:  Avoid getting the driveline exit  site dressing wet, and consider planning bathing times around exit site dressing changes. Use only the shower bag we gave you to shower with and don't get creative with your equipment to shower. Water and electricity do not mix and will cause your pump to stop.   Sit on a chair or bathing stool in the bathtub or shower stall, and use a basin of warm water and washcloth or sponge to wash. Or, wash at the sink while standing on a towel, so as not to get the floor or bath rug wet. To wash your hair, try using a hand-held shower wand or sprayer while standing over the kitchen sink or bathtub. Be sure that all floor surfaces are dry when walking around after bathing, to avoid slipping. Do not use powder around the exit site dressing. Anytime your dressing appears wet CHANGE IT! Drink 2 large glasses of water prior to getting in shower for first time (always hydrate before shower). Caregiver needs to be accessible during first few showers. Call VAD Coordinator if any changes in appearance of drive line site.    Carlton Adam RN VAD Coordinator  Office: [phone]  24/7 Pager: [phone]

## 2023-05-01 NOTE — Progress Notes (Signed)
Speed  Flow  PI  Power  LVIDD  AI  Aortic opening MR  TR  Septum  RV  VTI (>18cm)  5600  4.5 3.1 4.3 4.7 none 5/5  none trace midline mild    5700  4.2 3.7 4.6 4.5 trace 5/5 trace mild midline Mild/mod    5800      5/5    normal                                              Doppler MAP: 90 Auto cuff BP:  103/87 (93)   Ramp ECHO performed at bedside per   At completion of ramp study, patients primary controller programmed:  Fixed speed:5600 Low speed limit:5300    Carlton Adam RN, VAD Coordinator 24/7 pager [phone]

## 2023-05-01 NOTE — Patient Instructions (Addendum)
Stop Steven [name] Return to clinic Tuesday for dressing change Return to clinic in 1 month to see DR Bensimhon You may shower the day before or day that you come see [place] next week. Please clean your showerhead with bleach prior to showering each time.   Provided patient with shower bag for home use. Demonstration along with written instructions and illustrated steps provided.  Patient and caregiver verbalized understanding of same.    Here are some tips for washing:  Avoid getting the driveline exit site dressing wet, and consider planning bathing times around exit site dressing changes. Use only the shower bag we gave you to shower with and don't get creative with your equipment to shower. Water and electricity do not mix and will cause your pump to stop.   Sit on a chair or bathing stool in the bathtub or shower stall, and use a basin of warm water and washcloth or sponge to wash. Or, wash at the sink while standing on a towel, so as not to get the floor or bath rug wet. To wash your hair, try using a hand-held shower wand or sprayer while standing over the kitchen sink or bathtub. Be sure that all floor surfaces are dry when walking around after bathing, to avoid slipping. Do not use powder around the exit site dressing. Anytime your dressing appears wet CHANGE IT! Drink 2 large glasses of water prior to getting in shower for first time (always hydrate before shower). Caregiver needs to be accessible during first few showers. Call VAD Coordinator if any changes in appearance of drive line site.

## 2023-05-03 ENCOUNTER — Other Ambulatory Visit: Payer: Self-pay

## 2023-05-03 ENCOUNTER — Other Ambulatory Visit (HOSPITAL_COMMUNITY): Payer: Self-pay

## 2023-05-06 NOTE — Progress Notes (Signed)
LVAD CLINIC NOTE  PCP: [name], MD HF doc: DB   HPI:  Steven [name] is a 67 y.o. 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 Underwent HM-3 VAD placement 0n 03/08/23   Originally from [place], suffered a large out of hospital anterior wall myocardial infarction January 2012.  He presented after a three-hour delay to the hospital and was taken emergently for cath with DES to LAD. EF was 35-40% at time of MI. Unfortunately, did not have recovery of EF. C/w progressive decline in systolic function over the years. Ultimately underwent ICD.    Admitted on 02/26/23 w/ NYHA Class IV symptoms and low output. Echo showed EF < 20%, RV mildly reduced. RHC showed with low output. CI 2.1. Calcified pericardium (no H/o TB). Underwent HMIII LVAD implant on 03/08/23. Intra-op TEE LVEF 15%, RV normal.  Post op course was c/b afib w/ RVR, requiring IV amiodarone. Converted back to NSR.  Here for f/u. At last visit started [facility] for elevated MAPs. Since that time very dizzy and has fallen once without injury..Denies orthopnea or PND. No edema. No fevers, chills or problems with driveline. No bleeding, melena or neuro symptoms. No VAD alarms. Taking all meds as prescribed.     VAD Indication: Destination Therapy - pt preference, social issues   VAD interrogation & Equipment Management: Speed: 5600 Flow: 4.5 Power: 4.3 w    PI: 3.1   Alarms: no clinical alarms Events: 100+ daily  Fixed speed 5600 Low speed limit: 5300   Primary Controller:  Replace back up battery in 31 months. Back up controller:   pt left at home- advised again to bring backup bag with him to clinic visits and when he is outside his home.    Annual Equipment Maintenance on UBC/PM was performed on 03/08/23.    I reviewed the LVAD parameters from today and compared the results to the patient's prior  recorded data. LVAD interrogation was NEGATIVE for significant power changes, NEGATIVE for clinical alarms and STABLE for PI events/speed drops. No programming changes were made and pump is functioning within specified parameters. Pt is performing daily controller and system monitor self tests along with completing weekly and monthly maintenance for LVAD equipment.   LVAD equipment check completed and is in good working order. Back-up equipment NOT present. Charged back up battery and performed self-test on equipment.   Past Medical History:  Diagnosis Date   Acute MI, anterior wall (HCC)    AICD (automatic cardioverter/defibrillator) present    CAD (coronary artery disease)    2D ECHO, 07/13/2011 - EF <25%, LV moderatelty dilated, LA moderately dilatedLEXISCAN, 12/14/2011 - moderate-severe perfusion defect seen in the basal anteroseptal, mid anterior, apicacl anterior, apical, apical inferior, and apical lateral regions, post-stress EF 25%, new EKG changes from baseline abnormalities   Cancer [facility])    Prostate   CHF (congestive heart failure) (HCC) 2012   Hypertension 08/08/2021   Inguinal hernia, left    Pneumonia    November 2023   Pre-diabetes     Current Outpatient Medications  Medication Sig Dispense Refill   atorvastatin (LIPITOR) 80 MG tablet Take 1 tablet (80 mg total) by mouth daily. 30 tablet 0   docusate sodium (COLACE) 100 MG capsule Take 2 capsules (200 mg total) by mouth daily. (Patient not taking: Reported on 04/18/2023) 100 capsule 0   finasteride (PROSCAR)  5 MG tablet Take 1 tablet (5 mg total) by mouth daily. 30 tablet 6   Multiple Vitamins-Minerals (CERTAVITE/ANTIOXIDANTS) TABS Take 1 tablet by mouth daily. 130 tablet 0   pantoprazole (PROTONIX) 40 MG tablet Take 1 tablet (40 mg total) by mouth daily. 30 tablet 6   polyethylene glycol powder (GLYCOLAX/MIRALAX) 17 GM/SCOOP powder Take 17 g by mouth daily. (Patient not taking: Reported on 04/18/2023) 476 g 0   tamsulosin  (FLOMAX) 0.4 MG CAPS capsule Take 1 capsule (0.4 mg total) by mouth daily after supper. (Patient taking differently: Take 0.4 mg by mouth daily after supper.) 30 capsule 0   torsemide (DEMADEX) 20 MG tablet Take 1 tablet (20 mg total) by mouth daily as needed. (Patient not taking: Reported on 04/03/2023) 30 tablet 0   traMADol (ULTRAM) 50 MG tablet Take 1 tablet (50 mg total) by mouth every 4 (four) hours as needed for severe pain. (Patient not taking: Reported on 04/03/2023) 35 tablet 0   traZODone (DESYREL) 50 MG tablet Take 1 tablet (50 mg total) by mouth at bedtime as needed for sleep. 30 tablet 0   warfarin (COUMADIN) 2.5 MG tablet Take one tablet by mouth on Mon, Wed, Fri, Sat and Sunday with supper. Take 2 tablets on Tue and Thur with supper. (Patient taking differently: Take one tablet by mouth on Mon, Wed, Fri, Sat and Sunday with supper. Take 2 tablets on Tue and Thur with supper.) 50 tablet 0   No current facility-administered medications for this encounter.    Patient has no known allergies.  REVIEW OF SYSTEMS: All systems negative except as listed in HPI, PMH and Problem list.  Vital Signs:  Doppler Pressure: 100                         Automatc BP: 103/87 (93) HR: 81  SPO2: 100 %   Weight: 155.2 lb w/o eqt Last weight: 154.2 lb Home weights: 139-150 lbs-- did not bring red folder      Vitals:   05/01/23 1416 05/01/23 1417  BP: 103/87 (!) 100/0  Pulse: 81   SpO2: 100%   Weight: 70.4 kg (155 lb 3.2 oz)   Height: 5\' 10"  (1.778 m)     Physical Exam: General:  Thin. NAD.  HEENT: normal  Neck: supple. JVP not elevated.  Carotids 2+ bilat; no bruits. No lymphadenopathy or thryomegaly appreciated. Cor: LVAD hum.  Lungs: Clear. Abdomen: obese soft, nontender, non-distended. No hepatosplenomegaly. No bruits or masses. Good bowel sounds. Driveline site clean. Anchor in place.  Extremities: no cyanosis, clubbing, rash. Warm no edema  Neuro: alert & oriented x 3. No focal  deficits. Moves all 4 without problem     ASSESSMENT AND PLAN:    1. Chronic combined systolic and diastolic heart failure - Due to Icm CM with anterior MI in 2012 - Echo 02/27/23: EF <20%, LV with GHK, RV mildly reduced, GIIDD, LA mod dilated, mild MR.  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 GDMT limited by renal function and hypotension.  - S/P HM3 LVAD implant 03/08/23.   - Overall NYHA II but weaker recently due to Entresto - Stop Entresto - Volume ok. Keep fluid up    2. VAD - VAD interrogated personally. Parameters stable. - RAMP echo done today 05/01/23  in clinic and speed not changed - LDH 222 - DL ok - MAP ok but symptomatically not tolerating Entresto with orthostasis. Will stop  - INR 2.5  Goal 2.0-3.0 Discussed warfarin dosing with PharmD personally.  3. 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  - No s/s angina  - off asa as above.  - Continue statin    4. CKD stage 3: Creatinine baseline 1.6-1.8 - cardiorenal, resolved w/ LVAD support - Scr 1.27 today    5. H/o VT s/p Medtronic ICD: likely scar mediated - Quiescent. Off amio    6. Paroxysmal Atrial fibrillation:  - Off amio - Continue warfarin   I spent a total of 55 minutes today (including ramp echo): 1) reviewing the patient's medical records including previous charts, labs and recent notes from other providers; 2) examining the patient and counseling them on their medical issues/explaining the plan of care; 3) adjusting meds as needed and 4) ordering lab work or other needed tests.    Arvilla Meres, MD  1:24 PM

## 2023-05-07 ENCOUNTER — Other Ambulatory Visit (HOSPITAL_COMMUNITY): Payer: Self-pay

## 2023-05-08 ENCOUNTER — Ambulatory Visit (HOSPITAL_COMMUNITY)
Admission: RE | Admit: 2023-05-08 | Discharge: 2023-05-08 | Disposition: A | Discharge status: Home / Self Care | Payer: 59 | Source: Ambulatory Visit | Attending: Cardiology

## 2023-05-08 DIAGNOSIS — Z4509 Encounter for adjustment and management of other cardiac device: Secondary | ICD-10-CM | POA: Diagnosis present

## 2023-05-08 DIAGNOSIS — Z7901 Long term (current) use of anticoagulants: Secondary | ICD-10-CM | POA: Insufficient documentation

## 2023-05-08 DIAGNOSIS — Z95811 Presence of heart assist device: Secondary | ICD-10-CM | POA: Insufficient documentation

## 2023-05-08 NOTE — Progress Notes (Signed)
Pt presented to VAD clinic for drive line dressing change only. Reports no problems with VAD equipment or concerns with drive line   Exit Site Care: Drive line is being maintained once a week by VAD coordinators. Existing VAD dressing removed and site care performed using sterile technique. Drive line exit site cleaned with Chlora prep applicators x 2, allowed to dry, and Sorbaview dressing with Silverlon patch applied. Covered with 1 large tegaderm. Exit site healed and incorporated, the velour is exposed approx 1" at exit site. No redness, tenderness, drainage, foul odor or rash noted. Drive line anchor re-applied. Pt denies fever or chills. Pt states they have adequate dressing supplies at home. Pt had his first shower prior to coming to clinic today. Pt tells me that getting all the equipment together is difficult but that he was able to shower with no problems. Pt was instructed that he may only shower prior to coming to clinic.   Plan: Return to VAD Clinic next Tuesday dressing change and INR   Carlton Adam RN,BSN VAD Coordinator  Office: [phone]  24/7 Pager: [phone]

## 2023-05-11 ENCOUNTER — Other Ambulatory Visit (HOSPITAL_COMMUNITY): Payer: Self-pay | Admitting: Unknown Physician Specialty

## 2023-05-11 DIAGNOSIS — Z7901 Long term (current) use of anticoagulants: Secondary | ICD-10-CM

## 2023-05-11 DIAGNOSIS — Z95811 Presence of heart assist device: Secondary | ICD-10-CM

## 2023-05-14 ENCOUNTER — Other Ambulatory Visit (HOSPITAL_COMMUNITY): Payer: Self-pay | Admitting: Internal Medicine

## 2023-05-14 ENCOUNTER — Other Ambulatory Visit (HOSPITAL_COMMUNITY): Payer: Self-pay

## 2023-05-14 ENCOUNTER — Other Ambulatory Visit: Payer: Self-pay

## 2023-05-15 ENCOUNTER — Ambulatory Visit (HOSPITAL_COMMUNITY): Payer: Self-pay | Admitting: Pharmacist

## 2023-05-15 ENCOUNTER — Other Ambulatory Visit (HOSPITAL_COMMUNITY): Payer: Self-pay

## 2023-05-15 ENCOUNTER — Ambulatory Visit (HOSPITAL_COMMUNITY)
Admission: RE | Admit: 2023-05-15 | Discharge: 2023-05-15 | Disposition: A | Discharge status: Home / Self Care | Payer: 59 | Source: Ambulatory Visit | Attending: Cardiology | Admitting: Cardiology

## 2023-05-15 DIAGNOSIS — Z95811 Presence of heart assist device: Secondary | ICD-10-CM | POA: Diagnosis present

## 2023-05-15 DIAGNOSIS — Z7901 Long term (current) use of anticoagulants: Secondary | ICD-10-CM | POA: Diagnosis not present

## 2023-05-15 LAB — PROTIME-INR
INR: 2.1 — ABNORMAL HIGH (ref 0.8–1.2)
Prothrombin Time: 24.2 s — ABNORMAL HIGH (ref 11.4–15.2)

## 2023-05-15 MED FILL — Docusate Sodium Cap 100 MG: ORAL | 50 days supply | Qty: 100 | Fill #0 | Status: AC

## 2023-05-15 NOTE — Progress Notes (Signed)
Pt presented to VAD clinic for drive line dressing change only. Reports no problems with VAD equipment or concerns with drive line   Exit Site Care: Drive line is being maintained once a week by VAD coordinators. Existing VAD dressing removed and site care performed using sterile technique. Drive line exit site cleaned with Chlora prep applicators x 2, allowed to dry, and Sorbaview dressing with Silverlon patch applied. Covered with 1 large tegaderm. Exit site healed and incorporated, the velour is exposed approx 1" at exit site. No redness, tenderness, drainage, foul odor or rash noted. Drive line anchor re-applied. Pt denies fever or chills. Pt states they have adequate dressing supplies at home. Pt had his first shower prior to coming to clinic today. Pt tells me that getting all the equipment together is difficult but that he was able to shower with no problems. Pt was instructed that he may only shower prior to coming to clinic.   Plan: Return to VAD Clinic next Tuesday dressing change and INR   Simmie Davies RN,BSN VAD Coordinator  Office: [phone]  24/7 Pager: [phone]

## 2023-05-17 ENCOUNTER — Other Ambulatory Visit (HOSPITAL_COMMUNITY): Payer: Self-pay

## 2023-05-19 ENCOUNTER — Telehealth (HOSPITAL_COMMUNITY): Payer: Self-pay | Admitting: Unknown Physician Specialty

## 2023-05-19 NOTE — Telephone Encounter (Signed)
Received page from pt this morning stating that his VAD alarmed but has stopped. He tells me that he got up around midnight to use the bathroom and he felt very dizzy and fell to the floor on his bottom. Pt tells me that he went back to bed and when he got up this morning he had a low flow alarm. Current numbers Flow 3.6, PI 8.1, Speed 5600, Power 4.2.  Upon review of pt meds, I ask him if he had taken any prn torsemide. Pt tells me that he took a torsemide last night. He tells me that he didn't feel like he had any fluid on board but felt maybe it was time to take one. He has not had a Torsemide since he was d/c from the hospital after his VAD implant. D/w Dr Gala Romney. Pt was instructed not take a Torsemide unless instructed to do so by Dr Gala Romney. Pt instructed to drink plenty of water and eat something salty today. Pt denies any blood in his stool, SOB, or other heart failure symptoms. Pt instructed to call VAD coordinator if low flow alarms persists or he starts to feel dizzy again or like he is going to pass out. Pt verbalized understanding of all instructions.  Carlton Adam RN, BSN VAD Coordinator 24/7 Pager [phone]

## 2023-05-21 ENCOUNTER — Other Ambulatory Visit (HOSPITAL_COMMUNITY): Payer: Self-pay

## 2023-05-22 ENCOUNTER — Ambulatory Visit (HOSPITAL_BASED_OUTPATIENT_CLINIC_OR_DEPARTMENT_OTHER)
Admission: RE | Admit: 2023-05-22 | Discharge: 2023-05-22 | Disposition: A | Discharge status: Home / Self Care | Payer: 59 | Source: Ambulatory Visit | Attending: Internal Medicine | Admitting: Internal Medicine

## 2023-05-22 ENCOUNTER — Other Ambulatory Visit: Payer: Self-pay | Admitting: Unknown Physician Specialty

## 2023-05-22 ENCOUNTER — Other Ambulatory Visit (HOSPITAL_COMMUNITY): Payer: Self-pay | Admitting: Internal Medicine

## 2023-05-22 ENCOUNTER — Other Ambulatory Visit (HOSPITAL_COMMUNITY): Payer: Self-pay | Admitting: Unknown Physician Specialty

## 2023-05-22 ENCOUNTER — Ambulatory Visit (HOSPITAL_COMMUNITY)
Admission: RE | Admit: 2023-05-22 | Discharge: 2023-05-22 | Disposition: A | Discharge status: Home / Self Care | Payer: 59 | Source: Ambulatory Visit | Attending: Diagnostic Radiology | Admitting: Diagnostic Radiology

## 2023-05-22 ENCOUNTER — Ambulatory Visit (INDEPENDENT_AMBULATORY_CARE_PROVIDER_SITE_OTHER): Payer: 59

## 2023-05-22 VITALS — BP 86/0 | HR 80

## 2023-05-22 DIAGNOSIS — I5023 Acute on chronic systolic (congestive) heart failure: Secondary | ICD-10-CM

## 2023-05-22 DIAGNOSIS — I5042 Chronic combined systolic (congestive) and diastolic (congestive) heart failure: Secondary | ICD-10-CM

## 2023-05-22 DIAGNOSIS — J9601 Acute respiratory failure with hypoxia: Secondary | ICD-10-CM

## 2023-05-22 DIAGNOSIS — I255 Ischemic cardiomyopathy: Secondary | ICD-10-CM

## 2023-05-22 DIAGNOSIS — I5043 Acute on chronic combined systolic (congestive) and diastolic (congestive) heart failure: Secondary | ICD-10-CM

## 2023-05-22 DIAGNOSIS — I513 Intracardiac thrombosis, not elsewhere classified: Secondary | ICD-10-CM

## 2023-05-22 DIAGNOSIS — Z95811 Presence of heart assist device: Secondary | ICD-10-CM

## 2023-05-22 DIAGNOSIS — I5033 Acute on chronic diastolic (congestive) heart failure: Secondary | ICD-10-CM | POA: Insufficient documentation

## 2023-05-22 DIAGNOSIS — I5022 Chronic systolic (congestive) heart failure: Secondary | ICD-10-CM | POA: Diagnosis not present

## 2023-05-22 DIAGNOSIS — I4891 Unspecified atrial fibrillation: Secondary | ICD-10-CM

## 2023-05-22 DIAGNOSIS — I9789 Other postprocedural complications and disorders of the circulatory system, not elsewhere classified: Secondary | ICD-10-CM

## 2023-05-22 LAB — CBC
HCT: 40.2 % (ref 39.0–52.0)
Hemoglobin: 13 g/dL (ref 13.0–17.0)
MCH: 30.3 pg (ref 26.0–34.0)
MCHC: 32.3 g/dL (ref 30.0–36.0)
MCV: 93.7 fL (ref 80.0–100.0)
Platelets: 188 10*3/uL (ref 150–400)
RBC: 4.29 MIL/uL (ref 4.22–5.81)
RDW: 15.1 % (ref 11.5–15.5)
WBC: 6.1 10*3/uL (ref 4.0–10.5)
nRBC: 0 % (ref 0.0–0.2)

## 2023-05-22 LAB — COMPREHENSIVE METABOLIC PANEL
ALT: 16 U/L (ref 0–44)
AST: 28 U/L (ref 15–41)
Albumin: 3.8 g/dL (ref 3.5–5.0)
Alkaline Phosphatase: 82 U/L (ref 38–126)
Anion gap: 8 (ref 5–15)
BUN: 17 mg/dL (ref 8–23)
CO2: 24 mmol/L (ref 22–32)
Calcium: 9.2 mg/dL (ref 8.9–10.3)
Chloride: 106 mmol/L (ref 98–111)
Creatinine, Ser: 1.36 mg/dL — ABNORMAL HIGH (ref 0.61–1.24)
GFR, Estimated: 57 mL/min — ABNORMAL LOW (ref >60)
Glucose, Bld: 86 mg/dL (ref 70–99)
Potassium: 4.7 mmol/L (ref 3.5–5.1)
Sodium: 138 mmol/L (ref 135–145)
Total Bilirubin: 0.8 mg/dL (ref <1.2)
Total Protein: 7.8 g/dL (ref 6.5–8.1)

## 2023-05-22 LAB — CUP PACEART REMOTE DEVICE CHECK
Battery Remaining Longevity: 65 mo
Battery Voltage: 2.99 V
Brady Statistic AP VP Percent: 0.06 %
Brady Statistic AP VS Percent: 94.39 %
Brady Statistic AS VP Percent: 0 %
Brady Statistic AS VS Percent: 5.55 %
Brady Statistic RA Percent Paced: 94.16 %
Brady Statistic RV Percent Paced: 0.06 %
Date Time Interrogation Session: 20241126033521
HighPow Impedance: 43 Ohm
HighPow Impedance: 64 Ohm
Implantable Lead Connection Status: 753985
Implantable Lead Connection Status: 753985
Implantable Lead Implant Date: 20120906
Implantable Lead Implant Date: 20120906
Implantable Lead Location: 753859
Implantable Lead Location: 753860
Implantable Lead Model: 185
Implantable Lead Model: 5076
Implantable Lead Serial Number: 358872
Implantable Pulse Generator Implant Date: 20200831
Lead Channel Impedance Value: 323 Ohm
Lead Channel Impedance Value: 323 Ohm
Lead Channel Impedance Value: 342 Ohm
Lead Channel Pacing Threshold Amplitude: 0.875 V
Lead Channel Pacing Threshold Amplitude: 1.375 V
Lead Channel Pacing Threshold Pulse Width: 0.4 ms
Lead Channel Pacing Threshold Pulse Width: 0.4 ms
Lead Channel Sensing Intrinsic Amplitude: 1.375 mV
Lead Channel Sensing Intrinsic Amplitude: 1.375 mV
Lead Channel Sensing Intrinsic Amplitude: 2.5 mV
Lead Channel Sensing Intrinsic Amplitude: 2.5 mV
Lead Channel Setting Pacing Amplitude: 1.75 V
Lead Channel Setting Pacing Amplitude: 2.75 V
Lead Channel Setting Pacing Pulse Width: 0.4 ms
Lead Channel Setting Sensing Sensitivity: 0.3 mV
Zone Setting Status: 755011

## 2023-05-22 LAB — PROTIME-INR
INR: 2.3 — ABNORMAL HIGH (ref 0.8–1.2)
Prothrombin Time: 25.7 s — ABNORMAL HIGH (ref 11.4–15.2)

## 2023-05-22 LAB — LACTATE DEHYDROGENASE: LDH: 224 U/L — ABNORMAL HIGH (ref 98–192)

## 2023-05-22 NOTE — Progress Notes (Signed)
Speed  Flow  PI  Power  LVIDD  AI  Aortic opening MR  TR  Septum  RV  VTI (>18cm)  5600 3.7 6.3 4.4 4.9 trivial 5/5  none mild midline normal    5700  3.8 7.1 4.5 4.5 no 5/5 none mild midline normal                                                            Doppler MAP: 86 Auto cuff BP:  109/89 (96)   Ramp ECHO performed at bedside per   At completion of ramp study, patients primary controller programmed:  Fixed speed: 5700 Low speed limit: 5400    Carlton Adam RN, VAD Coordinator 24/7 pager [phone]

## 2023-05-22 NOTE — Progress Notes (Signed)
  Echocardiogram 2D Echocardiogram has been performed.  Delcie Roch 05/22/2023, 5:39 PM

## 2023-05-22 NOTE — Progress Notes (Signed)
Patient presents for sick visit in VAD Clinic today alone. Reports no problems with VAD equipment or concerns with drive line.   Pt has been having low flows over the weekend. This started on early Saturday morning hours after he got up to use the restroom. Pt tells me that  He drove himself here today. Pt did not have any LFs yesterday but had a LF this morning. He presents to clinic today for a dressing change, BP check and VAD interrogation.  Denies shortness of breath, heart failure symptoms, and signs of bleeding.  RAMP done today. See separate note for details. Increased speed by 100 to 5700.  EP doc in the room to review medtronic interrogation. Demand pacer set at 80. Dropped to 40 per Dr Gala Romney by Medtronic rep   Vital Signs:  Doppler Pressure: 86   Automatc BP: 109/89 (96) HR: 80  SPO2: 98 %   Weight: 155.2 lb w/o eqt Last weight: 154.2 lb Home weights: 139-150 lbs-- did not bring red folder   VAD Indication: Destination Therapy - pt preference, social issues   VAD interrogation & Equipment Management: Speed: 5700 Flow: 3.8 Power: 4.3 w    PI: 7   Alarms: no clinical alarms Events: 100+ daily  Fixed speed 5700 Low speed limit: 5400   Primary Controller:  Replace back up battery in 31 months. Back up controller:   pt left at home- advised again to bring backup bag with him to clinic visits and when he is outside his home.    Annual Equipment Maintenance on UBC/PM was performed on 03/08/23.    I reviewed the LVAD parameters from today and compared the results to the patient's prior recorded data. LVAD interrogation was NEGATIVE for significant power changes, NEGATIVE for clinical alarms and STABLE for PI events/speed drops. No programming changes were made and pump is functioning within specified parameters. Pt is performing daily controller and system monitor self tests along with completing weekly and monthly maintenance for LVAD equipment.   LVAD equipment check  completed and is in good working order. Back-up equipment NOT present. Charged back up battery and performed self-test on equipment.    Exit Site Care: Drive line is being maintained once a week by VAD coordinators. Existing VAD dressing removed and site care performed using sterile technique. Drive line exit site cleaned with Chlora prep applicators x 2, allowed to dry, and Sorbaview dressing with Silverlon patch applied. 1 large tegaderm placed over sorbaview. Exit site healed and incorporated, the velour is exposed approx 1" at exit site. No redness, tenderness, drainage, foul odor or rash noted. Drive line anchor re-applied. Pt denies fever or chills. Pt states they have adequate dressing supplies at home.   Significant Events on VAD Support:    Device: Medtronic Therapies: on VF 200 BPM Last check: 03/14/23   BP & Labs:  MAP 86 - Doppler is reflecting Modified systolic   Hgb 11.4 - No S/S of bleeding. Specifically denies melena/BRBPR or nosebleeds.   LDH stable at 226 with established baseline of 230-290. Denies tea-colored urine. No power elevations noted on interrogation.   Plan: Return to clinic Tuesday for dressing change Return to clinic in 1 month to see DR Bensimhon  Carlton Adam RN VAD Coordinator  Office: [phone]  24/7 Pager: [phone]

## 2023-05-23 ENCOUNTER — Other Ambulatory Visit (HOSPITAL_COMMUNITY): Payer: Self-pay | Admitting: Unknown Physician Specialty

## 2023-05-23 DIAGNOSIS — Z7901 Long term (current) use of anticoagulants: Secondary | ICD-10-CM

## 2023-05-23 DIAGNOSIS — Z95811 Presence of heart assist device: Secondary | ICD-10-CM

## 2023-05-23 LAB — ECHOCARDIOGRAM LIMITED

## 2023-05-24 NOTE — Progress Notes (Signed)
LVAD CLINIC NOTE  PCP: [name], MD HF doc: DB   HPI:  Steven [name] is a 67 y.o. 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 Underwent HM-3 VAD placement 0n 03/08/23   Originally from [place], suffered a large out of hospital anterior wall myocardial infarction January 2012.  He presented after a three-hour delay to the hospital and was taken emergently for cath with DES to LAD. EF was 35-40% at time of MI. Unfortunately, did not have recovery of EF. C/w progressive decline in systolic function over the years. Ultimately underwent ICD.    Admitted on 02/26/23 w/ NYHA Class IV symptoms and low output. Echo showed EF < 20%, RV mildly reduced. RHC showed with low output. CI 2.1. Calcified pericardium (no H/o TB). Underwent HMIII LVAD implant on 03/08/23. Intra-op TEE LVEF 15%, RV normal.  Post op course was c/b afib w/ RVR, requiring IV amiodarone. Converted back to NSR.  Here for unscheduled f/u due to low flow alarms on VAD. Multiple low flows over weekend and this am. Says he feels Ok. Just fatigued at times. No syncope or presyncope. No evidence of bleeding. Lightheadedness resolved after stopping Entresto at last visit. Denies orthopnea or PND. No fevers, chills or problems with driveline. No neuro symptoms.     VAD Indication: Destination Therapy - pt preference, social issues   VAD interrogation & Equipment Management: Speed: 5700 Flow: 3.8 Power: 4.3 w    PI: 7   Alarms: no clinical alarms Events: 100+ daily  Fixed speed 5700 Low speed limit: 5400   Primary Controller:  Replace back up battery in 31 months. Back up controller:   pt left at home- advised again to bring backup bag with him to clinic visits and when he is outside his home.     Past Medical History:  Diagnosis Date   Acute MI, anterior wall (HCC)    AICD (automatic  cardioverter/defibrillator) present    CAD (coronary artery disease)    2D ECHO, 07/13/2011 - EF <25%, LV moderatelty dilated, LA moderately dilatedLEXISCAN, 12/14/2011 - moderate-severe perfusion defect seen in the basal anteroseptal, mid anterior, apicacl anterior, apical, apical inferior, and apical lateral regions, post-stress EF 25%, new EKG changes from baseline abnormalities   Cancer [facility])    Prostate   CHF (congestive heart failure) (HCC) 2012   Hypertension 08/08/2021   Inguinal hernia, left    Pneumonia    November 2023   Pre-diabetes     Current Outpatient Medications  Medication Sig Dispense Refill   atorvastatin (LIPITOR) 80 MG tablet Take 1 tablet (80 mg total) by mouth daily. 30 tablet 0   docusate sodium (COLACE) 100 MG capsule Take 2 capsules (200 mg total) by mouth daily. 100 capsule 0   finasteride (PROSCAR) 5 MG tablet Take 1 tablet (5 mg total) by mouth daily. 30 tablet 6   Multiple Vitamins-Minerals (CERTAVITE/ANTIOXIDANTS) TABS Take 1 tablet by mouth daily. 130 tablet 0   pantoprazole (PROTONIX) 40 MG tablet Take 1 tablet (40 mg total) by mouth daily. 30 tablet 6   polyethylene glycol powder (GLYCOLAX/MIRALAX) 17 GM/SCOOP powder Take 17 g by mouth daily. (Patient not taking: Reported on 04/18/2023) 476 g 0   tamsulosin (FLOMAX) 0.4 MG CAPS capsule Take 1 capsule (0.4 mg total) by mouth daily after supper. (Patient taking differently: Take 0.4 mg by mouth daily after  supper.) 30 capsule 0   torsemide (DEMADEX) 20 MG tablet Take 1 tablet (20 mg total) by mouth daily as needed. (Patient not taking: Reported on 04/03/2023) 30 tablet 0   traMADol (ULTRAM) 50 MG tablet Take 1 tablet (50 mg total) by mouth every 4 (four) hours as needed for severe pain. (Patient not taking: Reported on 04/03/2023) 35 tablet 0   traZODone (DESYREL) 50 MG tablet Take 1 tablet (50 mg total) by mouth at bedtime as needed for sleep. 30 tablet 0   warfarin (COUMADIN) 2.5 MG tablet Take one tablet by  mouth on Mon, Wed, Fri, Sat and Sunday with supper. Take 2 tablets on Tue and Thur with supper. (Patient taking differently: Take one tablet by mouth on Mon, Wed, Fri, Sat and Sunday with supper. Take 2 tablets on Tue and Thur with supper.) 50 tablet 0   No current facility-administered medications for this encounter.    Patient has no known allergies.  REVIEW OF SYSTEMS: All systems negative except as listed in HPI, PMH and Problem list.  Vital Signs:  Doppler Pressure: 86               Automatc BP: 109/89 (96) HR: 80  SPO2: 98 %   Weight: 155.2 lb w/o eqt Last weight: 154.2 lb Home weights: 139-150 lbs-- did not bring red folder      Vitals:   05/22/23 1727 05/22/23 1728  BP: 109/89 (!) 86/0  Pulse: 80   SpO2: 98%     Physical Exam: General:  Thin. NAD.  HEENT: normal  Neck: supple. JVP not elevated.  Carotids 2+ bilat; no bruits. No lymphadenopathy or thryomegaly appreciated. Cor: LVAD hum.  Lungs: Clear. Abdomen: obese soft, nontender, non-distended. No hepatosplenomegaly. No bruits or masses. Good bowel sounds. Driveline site clean. Anchor in place.  Extremities: no cyanosis, clubbing, rash. Warm no edema  Neuro: alert & oriented x 3. No focal deficits. Moves all 4 without problem   ECG: Sinus with occasional a-pacing and chronic vpacing at 80   ICD: Optivol way up. Choinic vpacing at 80. No HRV. Activity level has dropped dramatically corresponding to VP. Personally reviewed   ASSESSMENT AND PLAN:   1. Chronic combined systolic and diastolic heart failure - Due to ICM with anterior MI in 2012 - Echo 02/27/23: EF <20%, LV with GHK, RV mildly reduced, GIIDD, LA mod dilated, mild MR.  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 GDMT limited by renal function and hypotension.  - S/P HM3 LVAD implant 03/08/23.   - Overall NYHA II but more fatigued recently - Volume ok on exam but optivol up - Did not tolerate Entresto with severe orthostasis   2. VAD - VAD  interrogated personally. Multiple LF alarms - ICD interrogated as above and has had chronic RV pacing at 80 which seems to have correlated with significant drop in activity level and no HRV - Device rep and EP team able to come dow to clinic and back-up rate changed to 40 - RAMP echo repeated today 05/22/23. VAD position OK. Mild RV dysfunction. Speed increased to 5700 - LDH 224 - Hgb 13.0 - DL ok - MAP ok - INR 2.3 Goal 2.0-3.0 Discussed warfarin dosing with PharmD personally.  3. 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  - No s/s angina - off asa as above.  - Continue statin    4. CKD stage 3: Creatinine baseline 1.6-1.8 - cardiorenal, resolved w/ LVAD  support - Scr 1.27 -> 1.36 today    5. H/o VT s/p Medtronic ICD: likely scar mediated - Quiescent. Off amio    6. Paroxysmal Atrial fibrillation:  - In NSR. Off amio - Continue warfarin   I spent a total of 55 minutes today (including ramp echo): 1) reviewing the patient's medical records including previous charts, labs and recent notes from other providers; 2) examining the patient and counseling them on their medical issues/explaining the plan of care; 3) adjusting meds as needed and 4) ordering lab work or other needed tests.    Arvilla Meres, MD  11:12 PM

## 2023-05-29 ENCOUNTER — Ambulatory Visit (HOSPITAL_COMMUNITY)
Admission: RE | Admit: 2023-05-29 | Discharge: 2023-05-29 | Disposition: A | Discharge status: Home / Self Care | Payer: 59 | Source: Ambulatory Visit | Attending: Cardiology | Admitting: Cardiology

## 2023-05-29 ENCOUNTER — Ambulatory Visit (HOSPITAL_COMMUNITY): Payer: Self-pay | Admitting: Pharmacist

## 2023-05-29 DIAGNOSIS — Z4801 Encounter for change or removal of surgical wound dressing: Secondary | ICD-10-CM | POA: Diagnosis present

## 2023-05-29 DIAGNOSIS — Z95811 Presence of heart assist device: Secondary | ICD-10-CM | POA: Insufficient documentation

## 2023-05-29 DIAGNOSIS — Z7901 Long term (current) use of anticoagulants: Secondary | ICD-10-CM | POA: Insufficient documentation

## 2023-05-29 LAB — PROTIME-INR
INR: 2.2 — ABNORMAL HIGH (ref 0.8–1.2)
Prothrombin Time: 24.5 s — ABNORMAL HIGH (ref 11.4–15.2)

## 2023-05-29 NOTE — Progress Notes (Signed)
Pt presented to VAD clinic for drive line dressing change and INR. Reports no problems with VAD equipment or concerns with drive line   Tolerating increased speed 5700. Denies any further low flows since speed increased to 5700 last week. Denies lightheadedness, dizziness, shortness of breath, heart failure symptoms, and signs of bleeding.  VAD interrogation & Equipment Management: Speed: 5700 Flow: 4.4 Power: 4.3 w    PI: 4.2   Alarms: no clinical alarms Events: 100+ daily  Fixed speed 5700 Low speed limit: 5400   Primary Controller:  Replace back up battery in 31 months. Back up controller:   pt left at home- advised again to bring backup bag with him to clinic visits and when he is outside his home.     Exit Site Care: Drive line is being maintained once a week by VAD coordinators. Existing VAD dressing removed and site care performed using sterile technique. Drive line exit site cleaned with Chlora prep applicators x 2, allowed to dry, and Sorbaview dressing with Silverlon patch applied. Covered with 1 large tegaderm. Exit site healed and incorporated, the velour is exposed approx 1" at exit site. No redness, tenderness, drainage, foul odor or rash noted. Drive line anchor re-applied. Pt denies fever or chills. Provided with 4 weekly dressing kits for home use. Instructed to bring kit to clinic each week for dressing change. Pt was instructed that he may only shower prior to coming to clinic.   Plan: Return to VAD Clinic next Tuesday dressing change and INR if needed Coumadin dosing per Lauren PharmD  Alyce Pagan RN VAD Coordinator  Office: [phone]  24/7 Pager: [phone]

## 2023-05-29 NOTE — Addendum Note (Signed)
Encounter addended by: Bernita Raisin, RN on: 05/29/2023 2:02 PM  Actions taken: Charge Capture section accepted

## 2023-06-04 ENCOUNTER — Other Ambulatory Visit (HOSPITAL_COMMUNITY): Payer: Self-pay

## 2023-06-04 DIAGNOSIS — Z7901 Long term (current) use of anticoagulants: Secondary | ICD-10-CM

## 2023-06-04 DIAGNOSIS — Z95811 Presence of heart assist device: Secondary | ICD-10-CM

## 2023-06-05 ENCOUNTER — Ambulatory Visit (HOSPITAL_COMMUNITY)
Admission: RE | Admit: 2023-06-05 | Discharge: 2023-06-05 | Disposition: A | Discharge status: Home / Self Care | Payer: 59 | Source: Ambulatory Visit | Attending: Internal Medicine | Admitting: Internal Medicine

## 2023-06-05 ENCOUNTER — Ambulatory Visit (HOSPITAL_COMMUNITY): Payer: Self-pay | Admitting: Pharmacist

## 2023-06-05 ENCOUNTER — Encounter (HOSPITAL_COMMUNITY): Payer: Self-pay | Admitting: Internal Medicine

## 2023-06-05 VITALS — BP 98/0 | HR 64 | Wt 169.8 lb

## 2023-06-05 DIAGNOSIS — Z95811 Presence of heart assist device: Secondary | ICD-10-CM | POA: Insufficient documentation

## 2023-06-05 DIAGNOSIS — I10 Essential (primary) hypertension: Secondary | ICD-10-CM

## 2023-06-05 DIAGNOSIS — Z7901 Long term (current) use of anticoagulants: Secondary | ICD-10-CM | POA: Insufficient documentation

## 2023-06-05 DIAGNOSIS — I5022 Chronic systolic (congestive) heart failure: Secondary | ICD-10-CM | POA: Diagnosis not present

## 2023-06-05 DIAGNOSIS — I4729 Other ventricular tachycardia: Secondary | ICD-10-CM

## 2023-06-05 DIAGNOSIS — I2510 Atherosclerotic heart disease of native coronary artery without angina pectoris: Secondary | ICD-10-CM | POA: Diagnosis not present

## 2023-06-05 DIAGNOSIS — Z95810 Presence of automatic (implantable) cardiac defibrillator: Secondary | ICD-10-CM

## 2023-06-05 DIAGNOSIS — Z4801 Encounter for change or removal of surgical wound dressing: Secondary | ICD-10-CM | POA: Diagnosis present

## 2023-06-05 LAB — COMPREHENSIVE METABOLIC PANEL
ALT: 17 U/L (ref 0–44)
AST: 29 U/L (ref 15–41)
Albumin: 3.6 g/dL (ref 3.5–5.0)
Alkaline Phosphatase: 78 U/L (ref 38–126)
Anion gap: 8 (ref 5–15)
BUN: 17 mg/dL (ref 8–23)
CO2: 22 mmol/L (ref 22–32)
Calcium: 9 mg/dL (ref 8.9–10.3)
Chloride: 109 mmol/L (ref 98–111)
Creatinine, Ser: 1.27 mg/dL — ABNORMAL HIGH (ref 0.61–1.24)
GFR, Estimated: >60 mL/min (ref >60)
Glucose, Bld: 127 mg/dL — ABNORMAL HIGH (ref 70–99)
Potassium: 3.8 mmol/L (ref 3.5–5.1)
Sodium: 139 mmol/L (ref 135–145)
Total Bilirubin: 0.8 mg/dL (ref <1.2)
Total Protein: 7.4 g/dL (ref 6.5–8.1)

## 2023-06-05 LAB — CBC
HCT: 41.2 % (ref 39.0–52.0)
Hemoglobin: 13.3 g/dL (ref 13.0–17.0)
MCH: 30.2 pg (ref 26.0–34.0)
MCHC: 32.3 g/dL (ref 30.0–36.0)
MCV: 93.4 fL (ref 80.0–100.0)
Platelets: 164 10*3/uL (ref 150–400)
RBC: 4.41 MIL/uL (ref 4.22–5.81)
RDW: 15 % (ref 11.5–15.5)
WBC: 6.6 10*3/uL (ref 4.0–10.5)
nRBC: 0 % (ref 0.0–0.2)

## 2023-06-05 LAB — PREALBUMIN: Prealbumin: 23 mg/dL (ref 18–38)

## 2023-06-05 LAB — MAGNESIUM: Magnesium: 1.9 mg/dL (ref 1.7–2.4)

## 2023-06-05 LAB — PROTIME-INR
INR: 1.8 — ABNORMAL HIGH (ref 0.8–1.2)
Prothrombin Time: 21.5 s — ABNORMAL HIGH (ref 11.4–15.2)

## 2023-06-05 LAB — LACTATE DEHYDROGENASE: LDH: 216 U/L — ABNORMAL HIGH (ref 98–192)

## 2023-06-05 NOTE — Patient Instructions (Signed)
No medication changes Please DO NOT take Torsemide; If symptomatic all VAD Clinic to advise Keep current Coumadin dosing Return in 1 week for dressing change/INR Return in 2 months to see Dr.Bensimhon

## 2023-06-05 NOTE — Progress Notes (Signed)
Patient presents for 1 month f/u follow up and 3 month Intermacs in VAD Clinic today alone. Reports no problems with VAD equipment or concerns with drive line.   Denies dizziness, falls, shortness of breath, heart failure symptoms, and signs of bleeding.   Pt continue to tolerate increased speed of 5700. No low flow alarms noted since ramp 05/22/23.  Pt continues to stay active by walking at a local park for 40 minutes daily.   Pt asking when to take Torsemide. Pt was instructed not take unless instructed to do so by Dr Gala Romney.   Vital Signs:  Doppler Pressure: 98   Automatic BP: 109/79 (88) HR: 64   SPO2: 100 %   Weight: 169.8 lb w/ eqt Last weight: 155.2 lb w/o eqt Home weights:157- did not bring red folder  VAD Indication: Destination Therapy - pt preference, social issues   VAD interrogation & Equipment Management: Speed: 5700 Flow: 4.3 Power: 4.4 w    PI: 3.1   Alarms: no clinical alarms Events: 10-20 daily  Fixed speed 5700 Low speed limit: 5400   Primary Controller:  Replace back up battery in 32 months. Back up controller:  pt left at home- advised again to bring backup bag with him to clinic visits and when he is outside his home.    Annual Equipment Maintenance on UBC/PM was performed on 03/08/23.    I reviewed the LVAD parameters from today and compared the results to the patient's prior recorded data. LVAD interrogation was NEGATIVE for significant power changes, NEGATIVE for clinical alarms and STABLE for PI events/speed drops. No programming changes were made and pump is functioning within specified parameters. Pt is performing daily controller and system monitor self tests along with completing weekly and monthly maintenance for LVAD equipment.   LVAD equipment check completed and is in good working order. Back-up equipment NOT present. Charged back up battery and performed self-test on equipment.    Exit Site Care: Drive line is being maintained once a  week by VAD coordinators. Existing VAD dressing removed and site care performed using sterile technique. Drive line exit site cleaned with Chlora prep applicators x 2, allowed to dry, and Sorbaview dressing with Silverlon patch applied. Exit site healed and incorporated, the velour is exposed approx 1" at exit site. No redness, tenderness, drainage, foul odor or rash noted. Drive line anchor re-applied. Pt denies fever or chills. Pt states they have adequate dressing supplies at home.   Patient completed 1200 feet during 6 minute walk test. Tolerated well.   Neurocognitive trail making completed correctly in 2 m 47 s.   [address] Cardiomyopathy, EQ-5D-3L and post-VAD QOL completed by the patient independently.   [place] Cardiomyopathy Questionnaire     06/05/2023   12:47 PM  KCCQ-12  1 a. Ability to shower/bathe Not at all limited  1 b. Ability to walk 1 block Not at all limited  1 c. Ability to hurry/jog Quite a bit limited  2. Edema feet/ankles/legs Never over the past 2 weeks  3. Limited by fatigue Never over the past 2 weeks  4. Limited by dyspnea Never over the past 2 weeks  5. Sitting up / on 3+ pillows Never over the past 2 weeks  6. Limited enjoyment of life Slightly limited  7. Rest of life w/ symptoms Mostly satisfied  8 a. Participation in hobbies Did not limit at all  8 b. Participation in chores Did not limit at all  8 c. Visiting family/friends Did  not limit at all     Significant Events on VAD Support:   Device: Medtronic Therapies: on VF 200 BPM Last check: 05/22/23  BP & Labs:  MAP 98 - Doppler is reflecting MAP   Hgb 11.4 - No S/S of bleeding. Specifically denies melena/BRBPR or nosebleeds.   LDH stable at 216 with established baseline of 230-290. Denies tea-colored urine. No power elevations noted on interrogation.   Plan: No medication changes Please DO NOT take Torsemide; If symptomatic all VAD Clinic to advise Keep current Coumadin dosing Return  in 1 week for dressing change/INR Return in 2 months to see Dr.Bensimhon   Simmie Davies RN,BSN VAD Coordinator  Office: [phone]  24/7 Pager: [phone]

## 2023-06-07 ENCOUNTER — Other Ambulatory Visit: Payer: Self-pay | Admitting: Physical Medicine and Rehabilitation

## 2023-06-11 ENCOUNTER — Other Ambulatory Visit (HOSPITAL_COMMUNITY): Payer: Self-pay

## 2023-06-12 ENCOUNTER — Other Ambulatory Visit (HOSPITAL_COMMUNITY): Payer: Self-pay

## 2023-06-12 ENCOUNTER — Other Ambulatory Visit: Payer: Self-pay

## 2023-06-12 ENCOUNTER — Ambulatory Visit (HOSPITAL_COMMUNITY)
Admission: RE | Admit: 2023-06-12 | Discharge: 2023-06-12 | Disposition: A | Discharge status: Home / Self Care | Payer: 59 | Source: Ambulatory Visit | Attending: Cardiology | Admitting: Cardiology

## 2023-06-12 DIAGNOSIS — Z95811 Presence of heart assist device: Secondary | ICD-10-CM

## 2023-06-12 MED ORDER — ATORVASTATIN CALCIUM 80 MG PO TABS
80.0000 mg | ORAL_TABLET | Freq: Every day | ORAL | 11 refills | Status: DC
  Filled 2023-06-12: qty 30, 30d supply, fill #0
  Filled 2023-07-09: qty 30, 30d supply, fill #1

## 2023-06-12 MED ORDER — WARFARIN SODIUM 2.5 MG PO TABS
ORAL_TABLET | ORAL | 3 refills | Status: DC
  Filled 2023-06-12: qty 50, 30d supply, fill #0
  Filled 2023-07-08: qty 50, 30d supply, fill #1
  Filled 2023-08-07: qty 50, 30d supply, fill #2
  Filled 2023-09-06: qty 50, 30d supply, fill #3

## 2023-06-12 NOTE — Progress Notes (Signed)
Pt presented to VAD clinic for drive line dressing change only. Reports no problems with VAD equipment or concerns with drive line     Exit Site Care: Drive line is being maintained once a week by VAD coordinators. Existing VAD dressing removed and site care performed using sterile technique. Drive line exit site cleaned with Chlora prep applicators x 2, allowed to dry, and Sorbaview dressing with Silverlon patch applied. Covered with 1 large tegaderm. Exit site healed and incorporated, the velour is exposed approx 1" at exit site. No redness, tenderness, drainage, foul odor or rash noted. Drive line anchor re-applied. Pt denies fever or chills. Pt states they have adequate dressing supplies at home. Pt was instructed that he may only shower prior to coming to clinic.   Plan: Return to VAD Clinic next Monday for dressing change and INR   Carlton Adam RN,BSN VAD Coordinator  Office: [phone]  24/7 Pager: [phone]

## 2023-06-12 NOTE — Addendum Note (Signed)
Encounter addended by: Lebron Quam, RN on: 06/12/2023 2:43 PM  Actions taken: Pharmacy for encounter modified, Order list changed

## 2023-06-13 ENCOUNTER — Other Ambulatory Visit (HOSPITAL_COMMUNITY): Payer: Self-pay

## 2023-06-15 ENCOUNTER — Other Ambulatory Visit (HOSPITAL_COMMUNITY): Payer: Self-pay | Admitting: *Deleted

## 2023-06-15 DIAGNOSIS — Z95811 Presence of heart assist device: Secondary | ICD-10-CM

## 2023-06-15 DIAGNOSIS — Z7901 Long term (current) use of anticoagulants: Secondary | ICD-10-CM

## 2023-06-18 ENCOUNTER — Ambulatory Visit (HOSPITAL_COMMUNITY)
Admission: RE | Admit: 2023-06-18 | Discharge: 2023-06-18 | Disposition: A | Discharge status: Home / Self Care | Payer: 59 | Source: Ambulatory Visit | Attending: Cardiology | Admitting: Cardiology

## 2023-06-18 ENCOUNTER — Ambulatory Visit (HOSPITAL_COMMUNITY): Payer: Self-pay | Admitting: Pharmacist

## 2023-06-18 ENCOUNTER — Encounter (HOSPITAL_COMMUNITY): Payer: Self-pay | Admitting: Unknown Physician Specialty

## 2023-06-18 DIAGNOSIS — Z95811 Presence of heart assist device: Secondary | ICD-10-CM | POA: Diagnosis present

## 2023-06-18 DIAGNOSIS — Z4509 Encounter for adjustment and management of other cardiac device: Secondary | ICD-10-CM | POA: Insufficient documentation

## 2023-06-18 DIAGNOSIS — Z7901 Long term (current) use of anticoagulants: Secondary | ICD-10-CM | POA: Insufficient documentation

## 2023-06-18 LAB — PROTIME-INR
INR: 1.8 — ABNORMAL HIGH (ref 0.8–1.2)
Prothrombin Time: 20.8 s — ABNORMAL HIGH (ref 11.4–15.2)

## 2023-06-18 NOTE — Progress Notes (Signed)
Pt presented to VAD clinic for drive line dressing change only. Reports no problems with VAD equipment or concerns with drive line   Exit Site Care: Drive line is being maintained once a week by VAD coordinators. Existing VAD dressing removed and site care performed using sterile technique. Drive line exit site cleaned with Chlora prep applicators x 2, allowed to dry, and Sorbaview dressing with Silverlon patch applied. Covered with 1 large tegaderm. Exit site healed and incorporated, the velour is exposed approx 1" at exit site. No redness, tenderness, drainage, foul odor or rash noted. Drive line anchor re-applied. Pt denies fever or chills. Pt states they have adequate dressing supplies at home. Pt was instructed that he may only shower prior to coming to clinic.   Plan: Return to VAD Clinic next Tuesday for dressing change and INR   Alyce Pagan RN VAD Coordinator  Office: [phone]  24/7 Pager: [phone]

## 2023-06-19 ENCOUNTER — Other Ambulatory Visit: Payer: Self-pay

## 2023-06-19 ENCOUNTER — Other Ambulatory Visit: Payer: Self-pay | Admitting: Physical Medicine and Rehabilitation

## 2023-06-19 ENCOUNTER — Other Ambulatory Visit (HOSPITAL_COMMUNITY): Payer: Self-pay

## 2023-06-21 ENCOUNTER — Other Ambulatory Visit (HOSPITAL_COMMUNITY): Payer: Self-pay

## 2023-06-21 ENCOUNTER — Other Ambulatory Visit: Payer: Self-pay | Admitting: Urology

## 2023-06-21 MED ORDER — TAMSULOSIN HCL 0.4 MG PO CAPS
0.4000 mg | ORAL_CAPSULE | Freq: Every day | ORAL | 0 refills | Status: DC
  Filled 2023-06-21: qty 30, 30d supply, fill #0

## 2023-06-21 NOTE — Progress Notes (Signed)
Remote ICD transmission.   

## 2023-06-22 ENCOUNTER — Other Ambulatory Visit (HOSPITAL_COMMUNITY): Payer: Self-pay

## 2023-06-22 DIAGNOSIS — Z7901 Long term (current) use of anticoagulants: Secondary | ICD-10-CM

## 2023-06-22 DIAGNOSIS — Z95811 Presence of heart assist device: Secondary | ICD-10-CM

## 2023-06-26 ENCOUNTER — Ambulatory Visit (HOSPITAL_COMMUNITY): Payer: Self-pay | Admitting: Pharmacist

## 2023-06-26 ENCOUNTER — Ambulatory Visit (HOSPITAL_COMMUNITY)
Admission: RE | Admit: 2023-06-26 | Discharge: 2023-06-26 | Disposition: A | Discharge status: Home / Self Care | Payer: 59 | Source: Ambulatory Visit | Attending: Cardiology | Admitting: Cardiology

## 2023-06-26 DIAGNOSIS — Z7901 Long term (current) use of anticoagulants: Secondary | ICD-10-CM | POA: Diagnosis not present

## 2023-06-26 DIAGNOSIS — Z4801 Encounter for change or removal of surgical wound dressing: Secondary | ICD-10-CM | POA: Insufficient documentation

## 2023-06-26 DIAGNOSIS — Z95811 Presence of heart assist device: Secondary | ICD-10-CM | POA: Diagnosis not present

## 2023-06-26 LAB — PROTIME-INR
INR: 1.6 — ABNORMAL HIGH (ref 0.8–1.2)
Prothrombin Time: 19.6 s — ABNORMAL HIGH (ref 11.4–15.2)

## 2023-06-26 NOTE — Addendum Note (Signed)
 Encounter addended by: Burna Sis, LCSW on: 06/26/2023 2:30 PM  Actions taken: Flowsheet accepted, Clinical Note Signed

## 2023-06-26 NOTE — Progress Notes (Signed)
 H&V Care Navigation CSW Progress Note  Patient requested to meet with CSW regarding financial concerns.  Patient reports he will be somewhat short on mortgage payment for this month (about $300-400) and wondering if we can assist.  Pt getting STD payments and social security but will be short as he is starting back work and won't get a paycheck for a few weeks.  CSW will discuss patient care fund assistance with supervisor to see if he can be approved.  Had patient sign grant acknowledgement in case we are able to assist.   SDOH Screenings   Food Insecurity: No Food Insecurity (02/26/2023)  Housing: High Risk (02/26/2023)  Transportation Needs: No Transportation Needs (02/26/2023)  Utilities: Not At Risk (02/26/2023)  Alcohol Screen: Low Risk  (05/08/2022)  Financial Resource Strain: Medium Risk (06/26/2023)  Social Connections: Unknown (11/08/2021)   Received from [facility], [facility]  Tobacco Use: Medium Risk (06/05/2023)   Andriette HILARIO Leech, LCSW Clinical Social Worker [facility] Desk#: [phone] Cell#: [phone]

## 2023-06-26 NOTE — Progress Notes (Signed)
 Pt presented to VAD clinic for drive line dressing change only. Reports no problems with VAD equipment or concerns with drive line   Work release letter faxed to [place] @ [employer] @ [phone].  Pt tells me that he needs help paying his bills today. Jenna-SW into see pt.  Exit Site Care: Drive line is being maintained once a week by VAD coordinators. Existing VAD dressing removed and site care performed using sterile technique. Drive line exit site cleaned with Chlora prep applicators x 2, allowed to dry, and Sorbaview dressing with Silverlon patch applied. Covered with 1 large tegaderm. Exit site healed and incorporated, the velour is exposed approx 1 at exit site. No redness, tenderness, drainage, foul odor or rash noted. Drive line anchor re-applied. Pt denies fever or chills. Pt states they have adequate dressing supplies at home. Pt was instructed that he may only shower prior to coming to clinic.   Plan: Return to VAD Clinic next Tuesday for dressing change    Lauraine Ip RN VAD Coordinator  Office: [phone]  24/7 Pager: [phone]

## 2023-06-26 NOTE — Progress Notes (Signed)
 LVAD INR

## 2023-06-28 ENCOUNTER — Telehealth (HOSPITAL_COMMUNITY): Payer: Self-pay | Admitting: Licensed Clinical Social Worker

## 2023-06-28 NOTE — Telephone Encounter (Signed)
 H&V Care Navigation CSW Progress Note  Clinical Social Worker spoke with supervisor regarding potential patient care fund assistance.  Patient does not meet criteria for Patient Care Fund assistance at this time.  Patient informed- will provide with other local resources to assist with reduction in cost of other expenses to help him make mortgage payment.   SDOH Screenings   Food Insecurity: No Food Insecurity (02/26/2023)  Housing: High Risk (02/26/2023)  Transportation Needs: No Transportation Needs (02/26/2023)  Utilities: Not At Risk (02/26/2023)  Alcohol Screen: Low Risk  (05/08/2022)  Financial Resource Strain: Medium Risk (06/26/2023)  Social Connections: Unknown (11/08/2021)   Received from [facility]  Tobacco Use: Medium Risk (06/05/2023)   Andriette HILARIO Leech, LCSW Clinical Social Worker [facility] Desk#: [phone] Cell#: [phone]

## 2023-07-03 ENCOUNTER — Encounter (HOSPITAL_COMMUNITY): Payer: Self-pay | Admitting: Unknown Physician Specialty

## 2023-07-03 ENCOUNTER — Ambulatory Visit (HOSPITAL_COMMUNITY)
Admission: RE | Admit: 2023-07-03 | Discharge: 2023-07-03 | Disposition: A | Discharge status: Home / Self Care | Payer: 59 | Source: Ambulatory Visit | Attending: Internal Medicine | Admitting: Internal Medicine

## 2023-07-03 DIAGNOSIS — Z4801 Encounter for change or removal of surgical wound dressing: Secondary | ICD-10-CM | POA: Diagnosis present

## 2023-07-03 DIAGNOSIS — Z95811 Presence of heart assist device: Secondary | ICD-10-CM | POA: Diagnosis not present

## 2023-07-03 NOTE — Progress Notes (Signed)
 Pt presented to VAD clinic for drive line dressing change only. Reports no problems with VAD equipment or concerns with drive line   New work release letter faxed to [place] @ [employer] @ [phone] to reflect no restrictions.  Pt given envelope from social worker Jenna. Pt needed help paying his rent until he was able to get back to work. Pt was denied help by the pt care fund.  Exit Site Care: Drive line is being maintained once a week by VAD coordinators. Existing VAD dressing removed and site care performed using sterile technique. Drive line exit site cleaned with Chlora prep applicators x 2, allowed to dry, and Sorbaview dressing with Silverlon patch applied. Covered with 1 large tegaderm. Exit site healed and incorporated, the velour is exposed approx 1 at exit site. No redness, tenderness, drainage, foul odor or rash noted. Drive line anchor re-applied. Pt denies fever or chills. Pt states they have adequate dressing supplies at home. Pt was instructed that he may only shower prior to coming to clinic.   Plan: Return to VAD Clinic next Tuesday for dressing change    Lauraine Ip RN VAD Coordinator  Office: [phone]  24/7 Pager: [phone]

## 2023-07-04 ENCOUNTER — Other Ambulatory Visit (HOSPITAL_COMMUNITY): Payer: Self-pay

## 2023-07-04 DIAGNOSIS — Z95811 Presence of heart assist device: Secondary | ICD-10-CM

## 2023-07-04 DIAGNOSIS — Z7901 Long term (current) use of anticoagulants: Secondary | ICD-10-CM

## 2023-07-09 ENCOUNTER — Other Ambulatory Visit (HOSPITAL_BASED_OUTPATIENT_CLINIC_OR_DEPARTMENT_OTHER): Payer: Self-pay

## 2023-07-09 ENCOUNTER — Other Ambulatory Visit (HOSPITAL_COMMUNITY): Payer: Self-pay

## 2023-07-09 ENCOUNTER — Other Ambulatory Visit: Payer: Self-pay | Admitting: Urology

## 2023-07-10 ENCOUNTER — Other Ambulatory Visit (HOSPITAL_COMMUNITY): Payer: Self-pay

## 2023-07-10 ENCOUNTER — Ambulatory Visit (HOSPITAL_COMMUNITY)
Admission: RE | Admit: 2023-07-10 | Discharge: 2023-07-10 | Disposition: A | Discharge status: Home / Self Care | Payer: 59 | Source: Ambulatory Visit | Attending: Internal Medicine | Admitting: Internal Medicine

## 2023-07-10 DIAGNOSIS — Z4509 Encounter for adjustment and management of other cardiac device: Secondary | ICD-10-CM | POA: Insufficient documentation

## 2023-07-10 DIAGNOSIS — Z95811 Presence of heart assist device: Secondary | ICD-10-CM | POA: Diagnosis not present

## 2023-07-10 DIAGNOSIS — Z7901 Long term (current) use of anticoagulants: Secondary | ICD-10-CM | POA: Diagnosis not present

## 2023-07-10 NOTE — Progress Notes (Signed)
 Pt presented to VAD clinic for drive line dressing change only. Reports no problems with VAD equipment or concerns with drive line   Exit Site Care: Drive line is being maintained once a week by VAD coordinators. Existing VAD dressing removed and site care performed using sterile technique. Drive line exit site cleaned with Chlora prep applicators x 2, allowed to dry, and Sorbaview dressing with Silverlon patch applied. Covered with 1 large tegaderm. Exit site healed and incorporated, the velour is exposed approx 1 at exit site. No redness, tenderness, drainage, foul odor or rash noted. Drive line anchor re-applied. Pt denies fever or chills. Pt states they have adequate dressing supplies at home. Pt was instructed that he may only shower prior to coming to clinic.   Plan: Return to VAD Clinic next Tuesday for dressing change & INR  Isaiah Knoll RN VAD Coordinator  Office: [phone]  24/7 Pager: [phone]

## 2023-07-12 ENCOUNTER — Other Ambulatory Visit (HOSPITAL_COMMUNITY): Payer: Self-pay | Admitting: Unknown Physician Specialty

## 2023-07-12 DIAGNOSIS — Z7901 Long term (current) use of anticoagulants: Secondary | ICD-10-CM

## 2023-07-12 DIAGNOSIS — Z95811 Presence of heart assist device: Secondary | ICD-10-CM

## 2023-07-17 ENCOUNTER — Ambulatory Visit (HOSPITAL_COMMUNITY)
Admission: RE | Admit: 2023-07-17 | Discharge: 2023-07-17 | Discharge status: Home / Self Care | Payer: 59 | Source: Ambulatory Visit | Attending: Cardiology

## 2023-07-17 ENCOUNTER — Ambulatory Visit (HOSPITAL_COMMUNITY): Payer: Self-pay | Admitting: Pharmacist

## 2023-07-17 DIAGNOSIS — Z4801 Encounter for change or removal of surgical wound dressing: Secondary | ICD-10-CM | POA: Insufficient documentation

## 2023-07-17 DIAGNOSIS — Z95811 Presence of heart assist device: Secondary | ICD-10-CM | POA: Insufficient documentation

## 2023-07-17 DIAGNOSIS — Z7901 Long term (current) use of anticoagulants: Secondary | ICD-10-CM | POA: Diagnosis not present

## 2023-07-17 LAB — PROTIME-INR
INR: 1.8 — ABNORMAL HIGH (ref 0.8–1.2)
Prothrombin Time: 20.6 s — ABNORMAL HIGH (ref 11.4–15.2)

## 2023-07-17 NOTE — Progress Notes (Signed)
Pt presented to VAD clinic for drive line dressing change only. Reports no problems with VAD equipment or concerns with drive line   Exit Site Care: Drive line is being maintained once a week by VAD coordinators. Existing VAD dressing removed and site care performed using sterile technique. Drive line exit site cleaned with Chlora prep applicators x 2, allowed to dry, and Sorbaview dressing with Silverlon patch applied. Covered with 1 large tegaderm. Exit site healed and incorporated, the velour is exposed approx 1" at exit site. No redness, tenderness, drainage, foul odor or rash noted. Drive line anchor re-applied. Pt denies fever or chills. Pt states they have adequate dressing supplies at home. Pt was instructed that he may only shower prior to coming to clinic.   Plan: Return to VAD Clinic next Tuesday for dressing change & INR  Simmie Davies RN,BSN VAD Coordinator  Office: [phone]  24/7 Pager: [phone]

## 2023-07-19 ENCOUNTER — Other Ambulatory Visit (HOSPITAL_COMMUNITY): Payer: Self-pay

## 2023-07-25 ENCOUNTER — Encounter: Payer: Self-pay | Admitting: Family Medicine

## 2023-07-25 ENCOUNTER — Ambulatory Visit (INDEPENDENT_AMBULATORY_CARE_PROVIDER_SITE_OTHER): Payer: 59 | Admitting: Family Medicine

## 2023-07-25 ENCOUNTER — Other Ambulatory Visit (HOSPITAL_COMMUNITY): Payer: Self-pay

## 2023-07-25 VITALS — BP 120/87 | HR 68 | Temp 97.6°F | Resp 16 | Ht 70.0 in | Wt 174.8 lb

## 2023-07-25 DIAGNOSIS — I5043 Acute on chronic combined systolic (congestive) and diastolic (congestive) heart failure: Secondary | ICD-10-CM

## 2023-07-25 DIAGNOSIS — I2510 Atherosclerotic heart disease of native coronary artery without angina pectoris: Secondary | ICD-10-CM

## 2023-07-25 DIAGNOSIS — I10 Essential (primary) hypertension: Secondary | ICD-10-CM

## 2023-07-25 DIAGNOSIS — I513 Intracardiac thrombosis, not elsewhere classified: Secondary | ICD-10-CM

## 2023-07-25 DIAGNOSIS — E871 Hypo-osmolality and hyponatremia: Secondary | ICD-10-CM

## 2023-07-25 DIAGNOSIS — Z7901 Long term (current) use of anticoagulants: Secondary | ICD-10-CM

## 2023-07-25 DIAGNOSIS — N1831 Chronic kidney disease, stage 3a: Secondary | ICD-10-CM

## 2023-07-25 DIAGNOSIS — Z1211 Encounter for screening for malignant neoplasm of colon: Secondary | ICD-10-CM

## 2023-07-25 DIAGNOSIS — Z95811 Presence of heart assist device: Secondary | ICD-10-CM

## 2023-07-25 DIAGNOSIS — Z95810 Presence of automatic (implantable) cardiac defibrillator: Secondary | ICD-10-CM

## 2023-07-25 NOTE — Progress Notes (Unsigned)
Established Patient Office Visit  Subjective   Patient ID: Steven [name], male    DOB: Nov 03, 1955  Age: 68 y.o. MRN: [id number]  Chief Complaint  Patient presents with   Establish Care   Medical Management of Chronic Issues    HPI He has had progression of his cardiac disease since he was last seen.  He has chronic combined systolic and diastolic heart failure due to ischemic cardiomyopathy, CAD,V. tach s/p Medtronic ICD, hyperlipidemia, apical mural thrombus, CKD stage III,  and history of subdural hematoma.  Underwent placement of HM-3 VAD on 03/08/2023.   History of large anterior wall MI January 2012.  Had DES to LAD.  EF was 35 to 40% and there was no recovery of his EF.  He has had a slow decline in his systolic function over the years and eventually went underwent ICD.   He was admitted 9/24 with NYHA class IV symptoms.  He blacked out on three occasions but did not hit his head.  He was short of breath with ambulation and had congestive heart failure.  Echo showed EF less than 20%.  He also had a calcified pericardium.  He had HM 3 LVAD implant 03/08/2023.  TEE LVEF 15%.  RV normal.  He had problems with atrial fibrillation with RVR requiring IV amiodarone following implantation.   Still works and walks every day in the park.  Denies shortness of breath, peripheral edema, PND and orthopnea.  He has not had syncope or presyncope.  He denies chest pains.  He is followed at the VAD clinic and has an appointment there tomorrow He is voiding okay.  Gets up once or twice at night.  Denies any heartburn. He has got an inguinal hernia that he would like to have repaired.  Denies any black or tarry stools no BRBPR.   PHQ-9-1 GAD-7-4. Flu vaccine at [facility] in December.  Prevnar given 2023.     ROS    Objective:     BP 120/87 (BP Location: Right Arm, Patient Position: Sitting, Cuff Size: Normal)   Pulse 68   Temp 97.6 F (36.4 C) (Oral)   Resp 16   Ht 5\' 10"  (1.778 m) Comment: per  patient  Wt 174 lb 12.8 oz (79.3 kg)   SpO2 98%   BMI 25.08 kg/m    Physical Exam Vitals and nursing note reviewed.  Constitutional:      Appearance: Normal appearance.  HENT:     Head: Normocephalic and atraumatic.  Eyes:     Conjunctiva/sclera: Conjunctivae normal.  Cardiovascular:     Rate and Rhythm: Normal rate and regular rhythm.  Pulmonary:     Effort: Pulmonary effort is normal.     Breath sounds: Normal breath sounds.  Musculoskeletal:     Right lower leg: No edema.     Left lower leg: No edema.  Skin:    General: Skin is warm and dry.  Neurological:     Mental Status: He is alert and oriented to person, place, and time.  Psychiatric:        Mood and Affect: Mood normal.        Behavior: Behavior normal.        Thought Content: Thought content normal.        Judgment: Judgment normal.          No results found for any visits on 07/25/23.    The ASCVD Risk score (Arnett DK, et al., 2019) failed to calculate for the  following reasons:   The valid total cholesterol range is 130 to 320 mg/dL    Assessment & Plan:  Screening for colon cancer  LVAD (left ventricular assist device) present (HCC)  Acute on chronic combined systolic and diastolic heart failure (HCC) Assessment & Plan: Has a left ventricle assist device.  Follows up in the VAD clinic.  Able to work.  No symptoms of CHF.  Orders: -     CBC with Differential/Platelet -     CMP14+EGFR -     Lipid panel  Apical mural thrombus Assessment & Plan: On Coumadin for his mural thrombus.  Denies black tarry stools and bright red blood per rectum.  Does bruise more easily.  Will check CBC   ICD  Medtronic protecta dual-chamber, implanted September 2012 primary prevention Assessment & Plan: Has ICD in situ   Long term current use of anticoagulant therapy  Coronary artery disease involving native coronary artery of native heart without angina pectoris Assessment & Plan: Goal is LDL 55.  Will  check lipids   Hyponatremia Assessment & Plan: Has a history of hyponatremia.  Checking CMP   Hypertension, unspecified type  Stage 3a chronic kidney disease (HCC) Assessment & Plan: Checking his renal function.  He is not on an ACE inhibitor.      Return in about 3 months (around 10/23/2023).    [name], MD

## 2023-07-26 ENCOUNTER — Ambulatory Visit (HOSPITAL_COMMUNITY)
Admission: RE | Admit: 2023-07-26 | Discharge: 2023-07-26 | Disposition: A | Discharge status: Home / Self Care | Payer: 59 | Source: Ambulatory Visit | Attending: Internal Medicine | Admitting: Internal Medicine

## 2023-07-26 ENCOUNTER — Ambulatory Visit (HOSPITAL_COMMUNITY): Payer: Self-pay | Admitting: Pharmacist

## 2023-07-26 ENCOUNTER — Encounter (HOSPITAL_COMMUNITY): Payer: Self-pay | Admitting: Internal Medicine

## 2023-07-26 VITALS — BP 101/89 | Ht 70.0 in | Wt 170.0 lb

## 2023-07-26 DIAGNOSIS — I5022 Chronic systolic (congestive) heart failure: Secondary | ICD-10-CM

## 2023-07-26 DIAGNOSIS — I4729 Other ventricular tachycardia: Secondary | ICD-10-CM

## 2023-07-26 DIAGNOSIS — I480 Paroxysmal atrial fibrillation: Secondary | ICD-10-CM

## 2023-07-26 DIAGNOSIS — Z7901 Long term (current) use of anticoagulants: Secondary | ICD-10-CM | POA: Diagnosis not present

## 2023-07-26 DIAGNOSIS — Z95811 Presence of heart assist device: Secondary | ICD-10-CM | POA: Diagnosis not present

## 2023-07-26 DIAGNOSIS — I2510 Atherosclerotic heart disease of native coronary artery without angina pectoris: Secondary | ICD-10-CM

## 2023-07-26 DIAGNOSIS — Z4509 Encounter for adjustment and management of other cardiac device: Secondary | ICD-10-CM | POA: Insufficient documentation

## 2023-07-26 DIAGNOSIS — Z95810 Presence of automatic (implantable) cardiac defibrillator: Secondary | ICD-10-CM

## 2023-07-26 LAB — PROTIME-INR
INR: 1.9 — ABNORMAL HIGH (ref 0.8–1.2)
Prothrombin Time: 22.2 s — ABNORMAL HIGH (ref 11.4–15.2)

## 2023-07-26 LAB — BASIC METABOLIC PANEL
Anion gap: 9 (ref 5–15)
BUN: 12 mg/dL (ref 8–23)
CO2: 21 mmol/L — ABNORMAL LOW (ref 22–32)
Calcium: 8.9 mg/dL (ref 8.9–10.3)
Chloride: 109 mmol/L (ref 98–111)
Creatinine, Ser: 1.25 mg/dL — ABNORMAL HIGH (ref 0.61–1.24)
GFR, Estimated: >60 mL/min (ref >60)
Glucose, Bld: 84 mg/dL (ref 70–99)
Potassium: 4.1 mmol/L (ref 3.5–5.1)
Sodium: 139 mmol/L (ref 135–145)

## 2023-07-26 LAB — CBC
HCT: 39.8 % (ref 39.0–52.0)
Hemoglobin: 13.4 g/dL (ref 13.0–17.0)
MCH: 30 pg (ref 26.0–34.0)
MCHC: 33.7 g/dL (ref 30.0–36.0)
MCV: 89 fL (ref 80.0–100.0)
Platelets: 146 10*3/uL — ABNORMAL LOW (ref 150–400)
RBC: 4.47 MIL/uL (ref 4.22–5.81)
RDW: 13.6 % (ref 11.5–15.5)
WBC: 5.9 10*3/uL (ref 4.0–10.5)
nRBC: 0 % (ref 0.0–0.2)

## 2023-07-26 LAB — LACTATE DEHYDROGENASE: LDH: 216 U/L — ABNORMAL HIGH (ref 98–192)

## 2023-07-26 NOTE — Assessment & Plan Note (Signed)
Goal is LDL 55.  Will check lipids

## 2023-07-26 NOTE — Assessment & Plan Note (Signed)
Checking his renal function.  He is not on an ACE inhibitor.

## 2023-07-26 NOTE — Assessment & Plan Note (Signed)
Has ICD in situ

## 2023-07-26 NOTE — Assessment & Plan Note (Signed)
Has a history of hyponatremia.  Checking CMP

## 2023-07-26 NOTE — Assessment & Plan Note (Signed)
Has a left ventricle assist device.  Follows up in the VAD clinic.  Able to work.  No symptoms of CHF.

## 2023-07-26 NOTE — Patient Instructions (Signed)
No change in medications Return to clinic next week for dressing change Return to clinic for a full visit in 2 months

## 2023-07-26 NOTE — Assessment & Plan Note (Signed)
On Coumadin for his mural thrombus.  Denies black tarry stools and bright red blood per rectum.  Does bruise more easily.  Will check CBC

## 2023-07-26 NOTE — Progress Notes (Signed)
Patient presents for 2 month f/u follow up in VAD Clinic today alone. Reports no problems with VAD equipment or concerns with drive line.   Denies dizziness, falls, shortness of breath, heart failure symptoms, and signs of bleeding.   Pt went back to work and is working 40 hrs a week. He tells me that he is tolerating.  Pt is not taking any Torsemide. He has had no further low flow alarms since decreasing his DDD pacing.  Vital Signs:  Doppler Pressure: 100   Automatic BP: 101/89 (95) HR: 63   SPO2: UTO   Weight: 170 lb w/ eqt Last weight: 169.8 lb w/o eqt   VAD Indication: Destination Therapy - pt preference, social issues   VAD interrogation & Equipment Management: Speed: 5700 Flow: 4.1 Power: 4.4 w    PI: 4.5   Alarms: no clinical alarms Events: 30 today; 80 yest  Fixed speed 5700 Low speed limit: 5400   Primary Controller:  Replace back up battery in 30 months. Back up controller:  pt left at home- advised again to bring backup bag with him to clinic visits and when he is outside his home.    Annual Equipment Maintenance on UBC/PM was performed on 03/08/23.    I reviewed the LVAD parameters from today and compared the results to the patient's prior recorded data. LVAD interrogation was NEGATIVE for significant power changes, NEGATIVE for clinical alarms and STABLE for PI events/speed drops. No programming changes were made and pump is functioning within specified parameters. Pt is performing daily controller and system monitor self tests along with completing weekly and monthly maintenance for LVAD equipment.   LVAD equipment check completed and is in good working order. Back-up equipment NOT present. Charged back up battery and performed self-test on equipment.    Exit Site Care: Drive line is being maintained once a week by VAD coordinators. Existing VAD dressing removed and site care performed using sterile technique. Drive line exit site cleaned with Chlora prep  applicators x 2, allowed to dry, and Sorbaview dressing with Silverlon patch applied. Exit site healed and incorporated, the velour is exposed approx 1" at exit site. No redness, tenderness, drainage, foul odor or rash noted. Drive line anchor re-applied. Pt denies fever or chills. Pt states they have adequate dressing supplies at home.      Significant Events on VAD Support:   Device: Medtronic Therapies: on VF 200 BPM Last check: 05/22/23  BP & Labs:  MAP 100 - Doppler is reflecting MAP   Hgb 11.4 - No S/S of bleeding. Specifically denies melena/BRBPR or nosebleeds.   LDH stable at 216 with established baseline of 230-290. Denies tea-colored urine. No power elevations noted on interrogation.   Plan: No change in medications Coumadin dosing per Posey Boyer D Return in 1 week for dressing change Return in 2 months to see Dr.Bensimhon   Carlton Adam RN,BSN VAD Coordinator  Office: [phone]  24/7 Pager: [phone]

## 2023-07-27 ENCOUNTER — Encounter: Payer: Self-pay | Admitting: Family Medicine

## 2023-07-27 LAB — CMP14+EGFR
ALT: 17 [IU]/L (ref 0–44)
AST: 35 [IU]/L (ref 0–40)
Albumin: 4.2 g/dL (ref 3.9–4.9)
Alkaline Phosphatase: 105 [IU]/L (ref 44–121)
BUN/Creatinine Ratio: 10 (ref 10–24)
BUN: 14 mg/dL (ref 8–27)
Bilirubin Total: 0.6 mg/dL (ref 0.0–1.2)
CO2: 21 mmol/L (ref 20–29)
Calcium: 8.9 mg/dL (ref 8.6–10.2)
Chloride: 107 mmol/L — ABNORMAL HIGH (ref 96–106)
Creatinine, Ser: 1.36 mg/dL — ABNORMAL HIGH (ref 0.76–1.27)
Globulin, Total: 3.1 g/dL (ref 1.5–4.5)
Glucose: 96 mg/dL (ref 70–99)
Potassium: 4.4 mmol/L (ref 3.5–5.2)
Sodium: 141 mmol/L (ref 134–144)
Total Protein: 7.3 g/dL (ref 6.0–8.5)
eGFR: 57 mL/min/{1.73_m2} — ABNORMAL LOW (ref >59)

## 2023-07-27 LAB — CBC WITH DIFFERENTIAL/PLATELET
Basophils Absolute: 0 10*3/uL (ref 0.0–0.2)
Basos: 1 %
EOS (ABSOLUTE): 1.4 10*3/uL — ABNORMAL HIGH (ref 0.0–0.4)
Eos: 24 %
Hematocrit: 41.1 % (ref 37.5–51.0)
Hemoglobin: 13.3 g/dL (ref 13.0–17.7)
Immature Grans (Abs): 0 10*3/uL (ref 0.0–0.1)
Immature Granulocytes: 0 %
Lymphocytes Absolute: 1.3 10*3/uL (ref 0.7–3.1)
Lymphs: 22 %
MCH: 29.6 pg (ref 26.6–33.0)
MCHC: 32.4 g/dL (ref 31.5–35.7)
MCV: 91 fL (ref 79–97)
Monocytes Absolute: 0.5 10*3/uL (ref 0.1–0.9)
Monocytes: 9 %
Neutrophils Absolute: 2.6 10*3/uL (ref 1.4–7.0)
Neutrophils: 44 %
Platelets: 156 10*3/uL (ref 150–450)
RBC: 4.5 x10E6/uL (ref 4.14–5.80)
RDW: 12.8 % (ref 11.6–15.4)
WBC: 5.8 10*3/uL (ref 3.4–10.8)

## 2023-07-27 LAB — LIPID PANEL
Chol/HDL Ratio: 2.5 {ratio} (ref 0.0–5.0)
Cholesterol, Total: 109 mg/dL (ref 100–199)
HDL: 44 mg/dL (ref >39)
LDL Chol Calc (NIH): 50 mg/dL (ref 0–99)
Triglycerides: 74 mg/dL (ref 0–149)
VLDL Cholesterol Cal: 15 mg/dL (ref 5–40)

## 2023-07-30 ENCOUNTER — Other Ambulatory Visit (HOSPITAL_COMMUNITY): Payer: Self-pay

## 2023-07-30 ENCOUNTER — Other Ambulatory Visit: Payer: Self-pay | Admitting: Urology

## 2023-07-30 ENCOUNTER — Other Ambulatory Visit: Payer: Self-pay

## 2023-07-30 MED ORDER — TAMSULOSIN HCL 0.4 MG PO CAPS
0.4000 mg | ORAL_CAPSULE | Freq: Every day | ORAL | 0 refills | Status: DC
  Filled 2023-07-30: qty 90, 90d supply, fill #0

## 2023-08-01 ENCOUNTER — Ambulatory Visit (HOSPITAL_COMMUNITY)
Admission: RE | Admit: 2023-08-01 | Discharge: 2023-08-01 | Disposition: A | Discharge status: Home / Self Care | Payer: 59 | Source: Ambulatory Visit | Attending: Internal Medicine | Admitting: Internal Medicine

## 2023-08-01 ENCOUNTER — Other Ambulatory Visit (HOSPITAL_COMMUNITY): Payer: Self-pay | Admitting: Internal Medicine

## 2023-08-01 ENCOUNTER — Other Ambulatory Visit (HOSPITAL_BASED_OUTPATIENT_CLINIC_OR_DEPARTMENT_OTHER): Payer: Self-pay

## 2023-08-01 DIAGNOSIS — Z4801 Encounter for change or removal of surgical wound dressing: Secondary | ICD-10-CM | POA: Diagnosis not present

## 2023-08-01 DIAGNOSIS — Z95811 Presence of heart assist device: Secondary | ICD-10-CM | POA: Diagnosis present

## 2023-08-01 NOTE — Progress Notes (Signed)
 Pt presented to VAD clinic for drive line dressing change only. Reports no problems with VAD equipment or concerns with drive line   Exit Site Care: Drive line is being maintained once a week by VAD coordinators. Existing VAD dressing removed and site care performed using sterile technique. Drive line exit site cleaned with Chlora prep applicators x 2, allowed to dry, and Sorbaview dressing with Silverlon patch applied. Covered with 1 large tegaderm. Exit site healed and incorporated, the velour is exposed approx 1 at exit site. No redness, tenderness, drainage, foul odor or rash noted. Drive line anchor re-applied. Pt denies fever or chills. Pt provided with 4 weekly dressing kits and asked to bring one to each clinic visit.  Pt was instructed that he may only shower prior to coming to clinic.   Plan: Return to VAD Clinic next Tuesday for dressing change & INR  Geofm Christmas RN,BSN VAD Coordinator  Office: [phone]  24/7 Pager: [phone]

## 2023-08-02 ENCOUNTER — Other Ambulatory Visit (HOSPITAL_BASED_OUTPATIENT_CLINIC_OR_DEPARTMENT_OTHER): Payer: Self-pay

## 2023-08-02 ENCOUNTER — Other Ambulatory Visit (HOSPITAL_COMMUNITY): Payer: Self-pay

## 2023-08-02 MED ORDER — ATORVASTATIN CALCIUM 80 MG PO TABS
80.0000 mg | ORAL_TABLET | Freq: Every day | ORAL | 3 refills | Status: AC
  Filled 2023-08-02 (×2): qty 90, 90d supply, fill #0
  Filled 2023-11-05: qty 90, 90d supply, fill #1
  Filled 2024-01-31: qty 90, 90d supply, fill #2
  Filled 2024-05-05: qty 90, 90d supply, fill #3

## 2023-08-03 ENCOUNTER — Other Ambulatory Visit (HOSPITAL_COMMUNITY): Payer: Self-pay | Admitting: Unknown Physician Specialty

## 2023-08-03 DIAGNOSIS — Z95811 Presence of heart assist device: Secondary | ICD-10-CM

## 2023-08-03 DIAGNOSIS — Z7901 Long term (current) use of anticoagulants: Secondary | ICD-10-CM

## 2023-08-06 ENCOUNTER — Other Ambulatory Visit (HOSPITAL_COMMUNITY): Payer: Self-pay

## 2023-08-07 ENCOUNTER — Ambulatory Visit (HOSPITAL_COMMUNITY)
Admission: RE | Admit: 2023-08-07 | Discharge: 2023-08-07 | Disposition: A | Discharge status: Home / Self Care | Payer: 59 | Source: Ambulatory Visit | Attending: Cardiology | Admitting: Cardiology

## 2023-08-07 ENCOUNTER — Other Ambulatory Visit (HOSPITAL_COMMUNITY): Payer: Self-pay

## 2023-08-07 ENCOUNTER — Ambulatory Visit (HOSPITAL_COMMUNITY): Payer: Self-pay | Admitting: Pharmacist

## 2023-08-07 DIAGNOSIS — Z4509 Encounter for adjustment and management of other cardiac device: Secondary | ICD-10-CM | POA: Diagnosis present

## 2023-08-07 DIAGNOSIS — Z95811 Presence of heart assist device: Secondary | ICD-10-CM | POA: Diagnosis not present

## 2023-08-07 DIAGNOSIS — Z7901 Long term (current) use of anticoagulants: Secondary | ICD-10-CM | POA: Diagnosis not present

## 2023-08-07 LAB — PROTIME-INR
INR: 1.8 — ABNORMAL HIGH (ref 0.8–1.2)
Prothrombin Time: 20.7 s — ABNORMAL HIGH (ref 11.4–15.2)

## 2023-08-07 NOTE — Progress Notes (Signed)
Pt presented to VAD clinic for drive line dressing change and INR. Reports no problems with VAD equipment or concerns with drive line.   Exit Site Care: Drive line is being maintained once a week by VAD coordinators. Existing VAD dressing removed and site care performed using sterile technique. Drive line exit site cleaned with Chlora prep applicators x 2, allowed to dry, and Sorbaview dressing with Silverlon patch applied. Covered with 1 large tegaderm. Exit site healed and incorporated, the velour is exposed approx 1" at exit site. No redness, tenderness, drainage, foul odor or rash noted. Drive line anchor re-applied. Pt denies fever or chills. Pt has sufficient dressing kits at home.  Pt was instructed that he may only shower prior to coming to clinic.   Plan: Return to VAD Clinic next Tuesday for dressing change only  Carlton Adam RN,BSN VAD Coordinator  Office: [phone]  24/7 Pager: [phone]

## 2023-08-08 ENCOUNTER — Other Ambulatory Visit (HOSPITAL_COMMUNITY): Payer: 59

## 2023-08-12 NOTE — Progress Notes (Signed)
LVAD CLINIC NOTE  PCP: [name], MD HF doc: DB  Chief complaint: HF  HPI:  Steven [name] is a 68 y.o. 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 Underwent HM-3 VAD placement 0n 03/08/23   Originally from [place], suffered a large out of hospital anterior wall myocardial infarction January 2012.  He presented after a three-hour delay to the hospital and was taken emergently for cath with DES to LAD. EF was 35-40% at time of MI. Unfortunately, did not have recovery of EF. C/w progressive decline in systolic function over the years. Ultimately underwent ICD.    Admitted on 02/26/23 w/ NYHA Class IV symptoms and low output. Echo showed EF < 20%, RV mildly reduced. RHC showed with low output. CI 2.1. Calcified pericardium (no H/o TB). Underwent HMIII LVAD implant on 03/08/23. Intra-op TEE LVEF 15%, RV normal.  Post op course was c/b afib w/ RVR, requiring IV amiodarone. Converted back to NSR.  Seen on 05/22/23 for unscheduled f/u due to low flow alarms on VAD. Pacer reprogrammed to let intrinsic rate come through (was RV pacing at 80). Feels much better. Walking 40 mins every day in the park. Denies orthopnea or PND. No fevers, chills or problems with driveline. No bleeding, melena or neuro symptoms. No VAD alarms. Taking all meds as prescribed.   He returns for routine f/u. Now back to work working 40hrs/week.  Feels good. Denies orthopnea or PND. No fevers, chills or problems with driveline. No bleeding, melena or neuro symptoms. No VAD alarms. Taking all meds as prescribed.      VAD Indication: Destination Therapy - pt preference, social issues   VAD interrogation & Equipment Management: Speed: 5700 Flow: 4.1 Power: 4.4w PI: 4.5   Alarms: no clinical alarms Events: 30 today; 80 yest   Fixed speed 5700 Low speed limit: 5400   Primary Controller:   Replace back up battery in 30 months. Back up controller:  pt left at home- advised again to bring backup bag with him to clinic visits and when he is outside his home.    Annual Equipment Maintenance on UBC/PM was performed on 03/08/23.    I reviewed the LVAD parameters from today and compared the results to the patient's prior recorded data. LVAD interrogation was NEGATIVE for significant power changes, NEGATIVE for clinical alarms and STABLE for PI events/speed drops. No programming changes were made and pump is functioning within specified parameters. Pt is performing daily controller and system monitor self tests along with completing weekly and monthly maintenance for LVAD equipment.   LVAD equipment check completed and is in good working order. Back-up equipment NOT present. Charged back up battery and performed self-test on equipment   LVAD equipment check completed and is in good working order. Back-up equipment NOT present. Charged back up battery and performed self-test on equipment.     Past Medical History:  Diagnosis Date   Acute MI, anterior wall (HCC)    AICD (automatic cardioverter/defibrillator) present    CAD (coronary artery disease)    2D ECHO, 07/13/2011 - EF <25%, LV moderatelty dilated, LA moderately dilatedLEXISCAN, 12/14/2011 - moderate-severe perfusion defect seen in the basal anteroseptal, mid anterior, apicacl anterior, apical, apical inferior, and apical lateral regions, post-stress EF 25%, new EKG changes from baseline abnormalities   Cancer [facility])    Prostate   CHF (congestive heart failure) (  HCC) 2012   Hypertension 08/08/2021   Inguinal hernia, left    Pneumonia    November 2023   Pre-diabetes     Current Outpatient Medications  Medication Sig Dispense Refill   docusate sodium (COLACE) 100 MG capsule Take 2 capsules (200 mg total) by mouth daily. 100 capsule 0   finasteride (PROSCAR) 5 MG tablet Take 1 tablet (5 mg total) by mouth daily. 30 tablet 6    Multiple Vitamins-Minerals (CERTAVITE/ANTIOXIDANTS) TABS Take 1 tablet by mouth daily. 130 tablet 0   pantoprazole (PROTONIX) 40 MG tablet Take 1 tablet (40 mg total) by mouth daily. 30 tablet 6   tamsulosin (FLOMAX) 0.4 MG CAPS capsule Take 1 capsule (0.4 mg total) by mouth daily after supper. 30 capsule 0   traMADol (ULTRAM) 50 MG tablet Take 1 tablet (50 mg total) by mouth every 4 (four) hours as needed for severe pain. 35 tablet 0   traZODone (DESYREL) 50 MG tablet Take 1 tablet (50 mg total) by mouth at bedtime as needed for sleep. 30 tablet 0   warfarin (COUMADIN) 2.5 MG tablet Take 1 (one) tablet by mouth on Mon, Wed, Fri, Sat and Sunday with supper. Take 2 (two) tablets on Tue and Thur with supper. 50 tablet 3   atorvastatin (LIPITOR) 80 MG tablet Take 1 tablet (80 mg total) by mouth daily. 90 tablet 3   tamsulosin (FLOMAX) 0.4 MG CAPS capsule Take 1 capsule (0.4 mg total) by mouth daily. 90 capsule 0   tamsulosin (FLOMAX) 0.4 MG CAPS capsule Take 1 capsule (0.4 mg total) by mouth daily. 90 capsule 0   torsemide (DEMADEX) 20 MG tablet Take 1 tablet (20 mg total) by mouth daily as needed. (Patient not taking: Reported on 07/26/2023) 30 tablet 0   No current facility-administered medications for this encounter.    Patient has no known allergies.  REVIEW OF SYSTEMS: All systems negative except as listed in HPI, PMH and Problem list.  Vital Signs:  Doppler Pressure: 100                         Automatic BP: 101/89 (95) HR: 63   SPO2: UTO   Weight: 170 lb w/ eqt Last weight: 169.8 lb w/o eqt  VAD vitals reviewed personally     Vitals:   07/26/23 1112 07/26/23 1113  BP: (!) 100/0 101/89  Weight:  77.1 kg (170 lb)  Height:  5\' 10"  (1.778 m)    Physical Exam: General:  NAD.  HEENT: normal  Neck: supple. JVP not elevated.  Carotids 2+ bilat; no bruits. No lymphadenopathy or thryomegaly appreciated. Cor: LVAD hum.  Lungs: Clear. Abdomen: soft, nontender, non-distended. No  hepatosplenomegaly. No bruits or masses. Good bowel sounds. Driveline site clean. Anchor in place.  Extremities: no cyanosis, clubbing, rash. Warm no edema  Neuro: alert & oriented x 3. No focal deficits. Moves all 4 without problem   ICD: No VT/AF. Optivol inaccurate Personally reviewed   ASSESSMENT AND PLAN:   1. Chronic combined systolic and diastolic heart failure - Due to ICM with anterior MI in 2012 - Echo 02/27/23: EF <20%, LV with GHK, RV mildly reduced, GIIDD, LA mod dilated, mild MR.  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 GDMT limited by renal function and hypotension.  - S/P HM3 LVAD implant 03/08/23.   - Doing very well after pacer reprogramming.  - NYHA I  - Volume status ok  - Did not tolerate Sherryll Burger  with severe orthostasis. Will not challenge with SGLT2i   2. VAD - VAD interrogated personally. Parameters stable. - ICD interrogated personally - RAMP echo repeated  05/22/23. VAD position OK. Mild RV dysfunction. Speed increased to 5700 - LDH 216 - Hgb 13.3 - DL ok - MAP ok - INR 1.9 Goal 2.0-3.0 Discussed warfarin dosing with PharmD personally.  3. 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  - No s/s angina - off asa as above.  - Continue statin   4. CKD stage 3: Creatinine baseline 1.6-1.8 - cardiorenal, resolved w/ LVAD support - Scr 1.25 today    5. H/o VT s/p Medtronic ICD: likely scar mediated - Quiescent. Off amio  - ICD interrogated as above   6. Paroxysmal Atrial fibrillation:  -In NSR. On warfarin   I spent a total of 41 minutes today: 1) reviewing the patient's medical records including previous charts, labs and recent notes from other providers; 2) examining the patient and counseling them on their medical issues/explaining the plan of care; 3) adjusting meds as needed and 4) ordering lab work or other needed tests.    Arvilla Meres, MD  4:06 PM

## 2023-08-12 NOTE — Addendum Note (Signed)
Encounter addended by: Dolores Patty, MD on: 08/12/2023 4:11 PM  Actions taken: Clinical Note Signed, Level of Service modified, Visit diagnoses modified, Charge Capture section accepted

## 2023-08-14 ENCOUNTER — Ambulatory Visit (HOSPITAL_COMMUNITY)
Admission: RE | Admit: 2023-08-14 | Discharge: 2023-08-14 | Disposition: A | Discharge status: Home / Self Care | Payer: 59 | Source: Ambulatory Visit | Attending: Cardiology | Admitting: Cardiology

## 2023-08-14 DIAGNOSIS — Z7901 Long term (current) use of anticoagulants: Secondary | ICD-10-CM | POA: Diagnosis not present

## 2023-08-14 DIAGNOSIS — Z95811 Presence of heart assist device: Secondary | ICD-10-CM | POA: Diagnosis not present

## 2023-08-14 DIAGNOSIS — Z4801 Encounter for change or removal of surgical wound dressing: Secondary | ICD-10-CM | POA: Diagnosis present

## 2023-08-14 NOTE — Progress Notes (Signed)
Pt presented to VAD clinic for drive line dressing change. Reports no problems with VAD equipment or concerns with drive line.   Exit Site Care: Drive line is being maintained once a week by VAD coordinators. Existing VAD dressing removed and site care performed using sterile technique. Drive line exit site cleaned with Chlora prep applicators x 2, allowed to dry, and Sorbaview dressing with Silverlon patch applied. Covered with 1 large tegaderm. Exit site healed and incorporated, the velour is exposed approx 1" at exit site. No redness, tenderness, drainage, foul odor or rash noted. Drive line anchor re-applied. Pt denies fever or chills. Pt has sufficient dressing kits at home.  Pt was instructed that he may only shower prior to coming to clinic.   Plan: Return to VAD Clinic next Tuesday for dressing change and INR  Simmie Davies RN,BSN VAD Coordinator  Office: [phone]  24/7 Pager: [phone]

## 2023-08-15 ENCOUNTER — Other Ambulatory Visit (HOSPITAL_COMMUNITY): Payer: Self-pay

## 2023-08-16 ENCOUNTER — Other Ambulatory Visit (HOSPITAL_COMMUNITY): Payer: Self-pay | Admitting: *Deleted

## 2023-08-16 DIAGNOSIS — Z7901 Long term (current) use of anticoagulants: Secondary | ICD-10-CM

## 2023-08-16 DIAGNOSIS — Z95811 Presence of heart assist device: Secondary | ICD-10-CM

## 2023-08-17 ENCOUNTER — Other Ambulatory Visit (HOSPITAL_COMMUNITY): Payer: Self-pay

## 2023-08-21 ENCOUNTER — Ambulatory Visit (HOSPITAL_COMMUNITY)
Admission: RE | Admit: 2023-08-21 | Discharge: 2023-08-21 | Disposition: A | Discharge status: Home / Self Care | Payer: 59 | Source: Ambulatory Visit | Attending: Cardiology | Admitting: Cardiology

## 2023-08-21 ENCOUNTER — Ambulatory Visit (HOSPITAL_COMMUNITY): Payer: Self-pay | Admitting: Pharmacist

## 2023-08-21 ENCOUNTER — Ambulatory Visit (INDEPENDENT_AMBULATORY_CARE_PROVIDER_SITE_OTHER): Payer: Medicaid Other

## 2023-08-21 DIAGNOSIS — Z95811 Presence of heart assist device: Secondary | ICD-10-CM | POA: Diagnosis not present

## 2023-08-21 DIAGNOSIS — I255 Ischemic cardiomyopathy: Secondary | ICD-10-CM

## 2023-08-21 DIAGNOSIS — Z7901 Long term (current) use of anticoagulants: Secondary | ICD-10-CM | POA: Insufficient documentation

## 2023-08-21 DIAGNOSIS — Z4509 Encounter for adjustment and management of other cardiac device: Secondary | ICD-10-CM | POA: Diagnosis present

## 2023-08-21 LAB — PROTIME-INR
INR: 1.5 — ABNORMAL HIGH (ref 0.8–1.2)
Prothrombin Time: 18.3 s — ABNORMAL HIGH (ref 11.4–15.2)

## 2023-08-21 NOTE — Progress Notes (Signed)
 LVAD INR

## 2023-08-21 NOTE — Progress Notes (Signed)
 Pt presented to VAD clinic for drive line dressing change. Reports no problems with VAD equipment or concerns with drive line.   Exit Site Care: Drive line is being maintained once a week by VAD coordinators. Existing VAD dressing removed and site care performed using sterile technique. Drive line exit site cleaned with Chlora prep applicators x 2, allowed to dry, and Sorbaview dressing with Silverlon patch applied. Covered with 1 large tegaderm. Exit site healed and incorporated, the velour is exposed approx 1" at exit site. No redness, tenderness, drainage, foul odor or rash noted. Drive line anchor re-applied. Pt denies fever or chills. Pt has sufficient dressing kits at home.  Pt was instructed that he may only shower prior to coming to clinic.   Plan: Return to VAD Clinic next Tuesday for dressing change and INR  Carlton Adam RN,BSN VAD Coordinator  Office: [phone]  24/7 Pager: [phone]

## 2023-08-22 LAB — CUP PACEART REMOTE DEVICE CHECK
Battery Remaining Longevity: 71 mo
Battery Voltage: 3 V
Brady Statistic AP VP Percent: 0 %
Brady Statistic AP VS Percent: 0.02 %
Brady Statistic AS VP Percent: 0.04 %
Brady Statistic AS VS Percent: 99.94 %
Brady Statistic RA Percent Paced: 0.02 %
Brady Statistic RV Percent Paced: 0.05 %
Date Time Interrogation Session: 20250225012505
HighPow Impedance: 44 Ohm
HighPow Impedance: 69 Ohm
Implantable Lead Connection Status: 753985
Implantable Lead Connection Status: 753985
Implantable Lead Implant Date: 20120906
Implantable Lead Implant Date: 20120906
Implantable Lead Location: 753859
Implantable Lead Location: 753860
Implantable Lead Model: 185
Implantable Lead Model: 5076
Implantable Lead Serial Number: 358872
Implantable Pulse Generator Implant Date: 20200831
Lead Channel Impedance Value: 342 Ohm
Lead Channel Impedance Value: 342 Ohm
Lead Channel Impedance Value: 380 Ohm
Lead Channel Pacing Threshold Amplitude: 0.5 V
Lead Channel Pacing Threshold Amplitude: 0.75 V
Lead Channel Pacing Threshold Pulse Width: 0.4 ms
Lead Channel Pacing Threshold Pulse Width: 0.4 ms
Lead Channel Sensing Intrinsic Amplitude: 1.25 mV
Lead Channel Sensing Intrinsic Amplitude: 1.25 mV
Lead Channel Sensing Intrinsic Amplitude: 1.625 mV
Lead Channel Sensing Intrinsic Amplitude: 1.625 mV
Lead Channel Setting Pacing Amplitude: 1.5 V
Lead Channel Setting Pacing Amplitude: 2.5 V
Lead Channel Setting Pacing Pulse Width: 0.4 ms
Lead Channel Setting Sensing Sensitivity: 0.3 mV
Zone Setting Status: 755011

## 2023-08-24 ENCOUNTER — Other Ambulatory Visit (HOSPITAL_COMMUNITY): Payer: Self-pay | Admitting: *Deleted

## 2023-08-24 DIAGNOSIS — Z95811 Presence of heart assist device: Secondary | ICD-10-CM

## 2023-08-24 DIAGNOSIS — Z7901 Long term (current) use of anticoagulants: Secondary | ICD-10-CM

## 2023-08-28 ENCOUNTER — Ambulatory Visit (HOSPITAL_COMMUNITY): Payer: Self-pay | Admitting: Pharmacist

## 2023-08-28 ENCOUNTER — Encounter: Payer: Self-pay | Admitting: Cardiovascular Disease

## 2023-08-28 ENCOUNTER — Other Ambulatory Visit (HOSPITAL_COMMUNITY): Payer: Self-pay

## 2023-08-28 ENCOUNTER — Other Ambulatory Visit: Payer: Self-pay

## 2023-08-28 ENCOUNTER — Other Ambulatory Visit (HOSPITAL_COMMUNITY): Payer: Self-pay | Admitting: *Deleted

## 2023-08-28 ENCOUNTER — Ambulatory Visit (HOSPITAL_COMMUNITY)
Admission: RE | Admit: 2023-08-28 | Discharge: 2023-08-28 | Disposition: A | Discharge status: Home / Self Care | Payer: 59 | Source: Ambulatory Visit | Attending: Internal Medicine | Admitting: Internal Medicine

## 2023-08-28 VITALS — BP 121/96 | HR 74

## 2023-08-28 DIAGNOSIS — Z79899 Other long term (current) drug therapy: Secondary | ICD-10-CM | POA: Insufficient documentation

## 2023-08-28 DIAGNOSIS — Z955 Presence of coronary angioplasty implant and graft: Secondary | ICD-10-CM | POA: Insufficient documentation

## 2023-08-28 DIAGNOSIS — I130 Hypertensive heart and chronic kidney disease with heart failure and stage 1 through stage 4 chronic kidney disease, or unspecified chronic kidney disease: Secondary | ICD-10-CM | POA: Insufficient documentation

## 2023-08-28 DIAGNOSIS — Z95810 Presence of automatic (implantable) cardiac defibrillator: Secondary | ICD-10-CM | POA: Diagnosis not present

## 2023-08-28 DIAGNOSIS — E785 Hyperlipidemia, unspecified: Secondary | ICD-10-CM | POA: Insufficient documentation

## 2023-08-28 DIAGNOSIS — I10 Essential (primary) hypertension: Secondary | ICD-10-CM

## 2023-08-28 DIAGNOSIS — I480 Paroxysmal atrial fibrillation: Secondary | ICD-10-CM | POA: Diagnosis not present

## 2023-08-28 DIAGNOSIS — I5084 End stage heart failure: Secondary | ICD-10-CM | POA: Insufficient documentation

## 2023-08-28 DIAGNOSIS — I2510 Atherosclerotic heart disease of native coronary artery without angina pectoris: Secondary | ICD-10-CM | POA: Insufficient documentation

## 2023-08-28 DIAGNOSIS — Z95811 Presence of heart assist device: Secondary | ICD-10-CM

## 2023-08-28 DIAGNOSIS — Z7901 Long term (current) use of anticoagulants: Secondary | ICD-10-CM | POA: Diagnosis not present

## 2023-08-28 DIAGNOSIS — I5042 Chronic combined systolic (congestive) and diastolic (congestive) heart failure: Secondary | ICD-10-CM | POA: Diagnosis present

## 2023-08-28 DIAGNOSIS — I252 Old myocardial infarction: Secondary | ICD-10-CM | POA: Diagnosis not present

## 2023-08-28 DIAGNOSIS — I255 Ischemic cardiomyopathy: Secondary | ICD-10-CM | POA: Insufficient documentation

## 2023-08-28 DIAGNOSIS — I5022 Chronic systolic (congestive) heart failure: Secondary | ICD-10-CM

## 2023-08-28 DIAGNOSIS — Z452 Encounter for adjustment and management of vascular access device: Secondary | ICD-10-CM | POA: Diagnosis not present

## 2023-08-28 DIAGNOSIS — N1831 Chronic kidney disease, stage 3a: Secondary | ICD-10-CM | POA: Diagnosis not present

## 2023-08-28 LAB — BASIC METABOLIC PANEL
Anion gap: 8 (ref 5–15)
BUN: 10 mg/dL (ref 8–23)
CO2: 24 mmol/L (ref 22–32)
Calcium: 9.1 mg/dL (ref 8.9–10.3)
Chloride: 107 mmol/L (ref 98–111)
Creatinine, Ser: 1.26 mg/dL — ABNORMAL HIGH (ref 0.61–1.24)
GFR, Estimated: >60 mL/min (ref >60)
Glucose, Bld: 131 mg/dL — ABNORMAL HIGH (ref 70–99)
Potassium: 4.1 mmol/L (ref 3.5–5.1)
Sodium: 139 mmol/L (ref 135–145)

## 2023-08-28 LAB — CBC
HCT: 42.1 % (ref 39.0–52.0)
Hemoglobin: 14 g/dL (ref 13.0–17.0)
MCH: 29.7 pg (ref 26.0–34.0)
MCHC: 33.3 g/dL (ref 30.0–36.0)
MCV: 89.2 fL (ref 80.0–100.0)
Platelets: 199 10*3/uL (ref 150–400)
RBC: 4.72 MIL/uL (ref 4.22–5.81)
RDW: 13.2 % (ref 11.5–15.5)
WBC: 7.6 10*3/uL (ref 4.0–10.5)
nRBC: 0 % (ref 0.0–0.2)

## 2023-08-28 LAB — LACTATE DEHYDROGENASE: LDH: 238 U/L — ABNORMAL HIGH (ref 98–192)

## 2023-08-28 LAB — PROTIME-INR
INR: 1.9 — ABNORMAL HIGH (ref 0.8–1.2)
Prothrombin Time: 22.2 s — ABNORMAL HIGH (ref 11.4–15.2)

## 2023-08-28 MED ORDER — LOSARTAN POTASSIUM 25 MG PO TABS
25.0000 mg | ORAL_TABLET | Freq: Every day | ORAL | 3 refills | Status: DC
  Filled 2023-08-28: qty 90, 90d supply, fill #0

## 2023-08-28 NOTE — Progress Notes (Addendum)
 Patient presents for INR & dressing change in VAD Clinic today alone. Reports no problems with VAD equipment or concerns with drive line.   Denies dizziness, falls, shortness of breath, heart failure symptoms, and signs of bleeding. Reports intermittent lightheaded episodes when he is standing/working. These do not happen often.   Pt went back to work and is working 40 hrs a week. He tells me that he is tolerating.  Reports he had 2 asymptomatic LOW FLOWs yesterday evening. On pump interrogation 80+ PI events yesterday noted. Low flow at 1630 + 1750 on controller. States he is drinking 2 L/day and is not taking Torsemide. He is SR on the monitor. ICD interrogation negative- reviewed with Dr Gala Romney. BP elevated- documented below. Per Dr Gala Romney start Losartan 25 mg daily. Prescription sent to pt's pharmacy. Will check BP and repeat BMET next week. Pt verbalized understanding.   Vital Signs:    Automatic BP: 121/96 (105) HR: 73 SR SPO2: 97%   VAD interrogation & Equipment Management: Speed: 5700 Flow: 3.9 Power: 4.4 w    PI: 5.0   Alarms: 2 LOW FLOWs yesterday Events: 80 + yesterday  Fixed speed 5700 Low speed limit: 5400    Exit Site Care: Drive line is being maintained once a week by VAD coordinators. Existing VAD dressing removed and site care performed using sterile technique. Drive line exit site cleaned with Chlora prep applicators x 2, allowed to dry, and Sorbaview dressing with Silverlon patch applied. Exit site healed and incorporated, the velour is exposed approx 1" at exit site. No redness, tenderness, drainage, foul odor or rash noted. Drive line anchor re-applied. Pt denies fever or chills. Pt provided with 4 weekly kits for home use.   BP & Labs:  MAP 100 - Doppler is reflecting MAP   Hgb pending - No S/S of bleeding. Specifically denies melena/BRBPR or nosebleeds.   LDH stable at pending with established baseline of 230-290. Denies tea-colored urine. No power  elevations noted on interrogation.   Plan: Start Losartan 25 mg daily Coumadin dosing per Lauren PharmD Return to clinic in 1 week for BMET/INR, BP check and dressing change.   Alyce Pagan RN VAD Coordinator  Office: [phone]  24/7 Pager: [phone]

## 2023-08-28 NOTE — Patient Instructions (Signed)
 Start Losartan 25 mg daily Coumadin dosing per Lauren PharmD Return to clinic in 1 week for BP check and dressing change

## 2023-08-29 ENCOUNTER — Other Ambulatory Visit (HOSPITAL_COMMUNITY): Payer: Self-pay

## 2023-08-29 DIAGNOSIS — Z7901 Long term (current) use of anticoagulants: Secondary | ICD-10-CM

## 2023-08-29 DIAGNOSIS — Z95811 Presence of heart assist device: Secondary | ICD-10-CM

## 2023-08-29 NOTE — Progress Notes (Signed)
 LVAD CLINIC NOTE  PCP: [name], MD HF doc: DB  Chief complaint: HF  HPI:  Steven [name] is a 68 y.o. 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 Underwent HM-3 VAD placement 0n 03/08/23   Originally from [place], suffered a large out of hospital anterior wall myocardial infarction January 2012.  He presented after a three-hour delay to the hospital and was taken emergently for cath with DES to LAD. EF was 35-40% at time of MI. Unfortunately, did not have recovery of EF. C/w progressive decline in systolic function over the years. Ultimately underwent ICD.    Admitted on 02/26/23 w/ NYHA Class IV symptoms and low output. Echo showed EF < 20%, RV mildly reduced. RHC showed with low output. CI 2.1. Calcified pericardium (no H/o TB). Underwent HMIII LVAD implant on 03/08/23. Intra-op TEE LVEF 15%, RV normal.  Post op course was c/b afib w/ RVR, requiring IV amiodarone. Converted back to NSR.  Seen on 05/22/23 for unscheduled f/u due to low flow alarms on VAD. Pacer reprogrammed to let intrinsic rate come through (was RV pacing at 80). Feels much better. Walking 40 mins every day in the park. Denies orthopnea or PND. No fevers, chills or problems with driveline. No bleeding, melena or neuro symptoms. No VAD alarms. Taking all meds as prescribed.   He presents for unscheduled visit due to 2 Low Flow alarms on VAD yesterday. Says he was asymptomatic. Has been drinking at least 2L per day. Not taking torsemide. No ICD shocks. No bleeding or dark stools. Denies orthopnea or PND. No fevers, chills or problems with driveline. No VAD alarms. Taking all meds as prescribed.     VAD interrogation & Equipment Management: Speed: 5700 Flow: 3.9 Power: 4.4 w    PI: 5.0   Alarms: 2 LOW FLOWs yesterday Events: 80+ yesterday   Fixed speed 5700 Low speed limit: 5400      Exit Site Care: Drive line is being maintained once a week by VAD coordinators. Existing VAD dressing removed and site care performed using sterile technique. Drive line exit site cleaned with Chlora prep applicators x 2, allowed to dry, and Sorbaview dressing with Silverlon patch applied. Exit site healed and incorporated, the velour is exposed approx 1" at exit site. No redness, tenderness, drainage, foul odor or rash noted. Drive line anchor re-applied. Pt denies fever or chills. Pt provided with 4 weekly kits for home use.     Past Medical History:  Diagnosis Date   Acute MI, anterior wall (HCC)    AICD (automatic cardioverter/defibrillator) present    CAD (coronary artery disease)    2D ECHO, 07/13/2011 - EF <25%, LV moderatelty dilated, LA moderately dilatedLEXISCAN, 12/14/2011 - moderate-severe perfusion defect seen in the basal anteroseptal, mid anterior, apicacl anterior, apical, apical inferior, and apical lateral regions, post-stress EF 25%, new EKG changes from baseline abnormalities   Cancer [facility])    Prostate   CHF (congestive heart failure) (HCC) 2012   Hypertension 08/08/2021   Inguinal hernia, left    Pneumonia    November 2023   Pre-diabetes     Current Outpatient Medications  Medication Sig Dispense Refill   atorvastatin (LIPITOR) 80 MG tablet Take 1 tablet (80 mg total) by mouth daily. 90 tablet 3   docusate sodium (COLACE) 100 MG capsule Take 2 capsules (200 mg total) by mouth daily.  100 capsule 0   finasteride (PROSCAR) 5 MG tablet Take 1 tablet (5 mg total) by mouth daily. 30 tablet 6   losartan (COZAAR) 25 MG tablet Take 1 tablet (25 mg total) by mouth daily. 90 tablet 3   Multiple Vitamins-Minerals (CERTAVITE/ANTIOXIDANTS) TABS Take 1 tablet by mouth daily. 130 tablet 0   pantoprazole (PROTONIX) 40 MG tablet Take 1 tablet (40 mg total) by mouth daily. 30 tablet 6   tamsulosin (FLOMAX) 0.4 MG CAPS capsule Take 1 capsule (0.4 mg total) by mouth daily after supper. 30  capsule 0   warfarin (COUMADIN) 2.5 MG tablet Take 1 (one) tablet by mouth on Mon, Wed, Fri, Sat and Sunday with supper. Take 2 (two) tablets on Tue and Thur with supper. 50 tablet 3   tamsulosin (FLOMAX) 0.4 MG CAPS capsule Take 1 capsule (0.4 mg total) by mouth daily. 90 capsule 0   tamsulosin (FLOMAX) 0.4 MG CAPS capsule Take 1 capsule (0.4 mg total) by mouth daily. 90 capsule 0   torsemide (DEMADEX) 20 MG tablet Take 1 tablet (20 mg total) by mouth daily as needed. (Patient not taking: Reported on 08/28/2023) 30 tablet 0   traMADol (ULTRAM) 50 MG tablet Take 1 tablet (50 mg total) by mouth every 4 (four) hours as needed for severe pain. (Patient not taking: Reported on 08/28/2023) 35 tablet 0   traZODone (DESYREL) 50 MG tablet Take 1 tablet (50 mg total) by mouth at bedtime as needed for sleep. (Patient not taking: Reported on 08/28/2023) 30 tablet 0   No current facility-administered medications for this encounter.    Patient has no known allergies.  REVIEW OF SYSTEMS: All systems negative except as listed in HPI, PMH and Problem list.   Vital Signs:                   Automatic BP:121/96 (105) HR: 73 SR SPO2: 97%   Vitals:   08/28/23 1145  BP: (!) 121/96  Pulse: 74  SpO2: 97%    Physical Exam: General:  NAD.  HEENT: normal  Neck: supple. JVP not elevated.  Carotids 2+ bilat; no bruits. No lymphadenopathy or thryomegaly appreciated. Cor: LVAD hum.  Lungs: Clear. Abdomen: soft, nontender, non-distended. No hepatosplenomegaly. No bruits or masses. Good bowel sounds. Driveline site clean. Anchor in place.  Extremities: no cyanosis, clubbing, rash. Warm no edema  Neuro: alert & oriented x 3. No focal deficits. Moves all 4 without problem    ICD: No VT/AF volume ok Personally reviewed  ASSESSMENT AND PLAN:   1. Chronic combined systolic and diastolic heart failure - Due to ICM with anterior MI in 2012 - Echo 02/27/23: EF <20%, LV with GHK, RV mildly reduced, GIIDD, LA mod  dilated, mild MR.  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 GDMT limited by renal function and hypotension.  - S/P HM3 LVAD implant 03/08/23.   - Doing very well after pacer reprogramming.  - NYHA I - Volume status looks ok despite low flows. Continue to hydrate - Did not tolerate Entresto with severe orthostasis. Will not challenge with SGLT2i    2. VAD  Low flow alarms - VAD interrogated personally.Several low flow alarms and frequent PIs. Volume status ok. Rhythm ok. No bleeding.. Suspect may be due to high afterload - ICD interrogated personally as above - RAMP echo repeated  05/22/23. VAD position OK. Mild RV dysfunction. Speed increased to 5700 - LDH 238 - Hgb 14.0 - DL ok - MAP high, Restart losartan -  INR 1.8 Goal 2.0-3.0 Discussed warfarin dosing with PharmD personally.  3. 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  - No s/s angina - off asa as above.  - Continue statin   4. CKD stage 3: Creatinine baseline 1.6-1.8 - cardiorenal, resolved w/ LVAD support - Scr stable at 1.25 today   5. H/o VT s/p Medtronic ICD: likely scar mediated - No recent VT. Off maio - ICD interrogated personally   6. Paroxysmal Atrial fibrillation:  - In NSR. On warfarin   I spent a total of 45 minutes today: 1) reviewing the patient's medical records including previous charts, labs and recent notes from other providers; 2) examining the patient and counseling them on their medical issues/explaining the plan of care; 3) adjusting meds as needed and 4) ordering lab work or other needed tests.   Arvilla Meres, MD  9:06 PM

## 2023-09-03 ENCOUNTER — Other Ambulatory Visit (HOSPITAL_COMMUNITY): Payer: Self-pay

## 2023-09-03 MED ORDER — CEPHALEXIN 500 MG PO CAPS
500.0000 mg | ORAL_CAPSULE | Freq: Two times a day (BID) | ORAL | 0 refills | Status: DC
  Filled 2023-09-03: qty 6, 3d supply, fill #0

## 2023-09-04 ENCOUNTER — Encounter (HOSPITAL_COMMUNITY): Payer: Self-pay

## 2023-09-04 ENCOUNTER — Ambulatory Visit (HOSPITAL_COMMUNITY)
Admission: RE | Admit: 2023-09-04 | Discharge: 2023-09-04 | Disposition: A | Discharge status: Home / Self Care | Source: Ambulatory Visit | Attending: Cardiology | Admitting: Cardiology

## 2023-09-04 ENCOUNTER — Ambulatory Visit (HOSPITAL_COMMUNITY): Payer: Self-pay | Admitting: Pharmacist

## 2023-09-04 VITALS — BP 116/0 | HR 74

## 2023-09-04 DIAGNOSIS — Z7901 Long term (current) use of anticoagulants: Secondary | ICD-10-CM | POA: Diagnosis not present

## 2023-09-04 DIAGNOSIS — I5043 Acute on chronic combined systolic (congestive) and diastolic (congestive) heart failure: Secondary | ICD-10-CM | POA: Insufficient documentation

## 2023-09-04 DIAGNOSIS — Z4509 Encounter for adjustment and management of other cardiac device: Secondary | ICD-10-CM | POA: Insufficient documentation

## 2023-09-04 DIAGNOSIS — Z95811 Presence of heart assist device: Secondary | ICD-10-CM | POA: Diagnosis not present

## 2023-09-04 DIAGNOSIS — I509 Heart failure, unspecified: Secondary | ICD-10-CM | POA: Diagnosis present

## 2023-09-04 LAB — BASIC METABOLIC PANEL
Anion gap: 4 — ABNORMAL LOW (ref 5–15)
BUN: 19 mg/dL (ref 8–23)
CO2: 26 mmol/L (ref 22–32)
Calcium: 8.9 mg/dL (ref 8.9–10.3)
Chloride: 107 mmol/L (ref 98–111)
Creatinine, Ser: 1.48 mg/dL — ABNORMAL HIGH (ref 0.61–1.24)
GFR, Estimated: 51 mL/min — ABNORMAL LOW (ref >60)
Glucose, Bld: 113 mg/dL — ABNORMAL HIGH (ref 70–99)
Potassium: 4.2 mmol/L (ref 3.5–5.1)
Sodium: 137 mmol/L (ref 135–145)

## 2023-09-04 LAB — PROTIME-INR
INR: 2.2 — ABNORMAL HIGH (ref 0.8–1.2)
Prothrombin Time: 24.5 s — ABNORMAL HIGH (ref 11.4–15.2)

## 2023-09-04 NOTE — Progress Notes (Signed)
 Pt presented to VAD clinic for drive line dressing change, BP check and BMET/INR. Reports no problems with VAD equipment or concerns with drive line.   Losartan 25mg  added last visit. Pt reports no problems since starting medication. Denies lightheadedness, dizziness, falls, shortness of breath, and signs of bleeding.   Pt continues to have asymptomatic LOW FLOWS recently presenting on 3/5 & 3/7. Discussed with Dr. Elwyn Lade will do Ramp Echo at next week. Pt hypertensive today but has not taken his morning medications. Asked pt to take medications prior to next week's appt and advised to continue to stay hydrated and if he feels lightheaded at work to eat something salty.  Vital Signs:                   Automatic BP: 116/101 (108) Doppler: 116 HR: 74 SR SPO2: 99%   VAD interrogation & Equipment Management: Speed: 5700 Flow: 4.4 Power: 4.4 w    PI: 5.2   Alarms: LOW FLOW 3/5 & 3/7 Events: 80+ Hct: Adjusted to 20   Fixed speed 5700 Low speed limit: 5400  Exit Site Care: Drive line is being maintained once a week by VAD coordinators. Existing VAD dressing removed and site care performed using sterile technique. Drive line exit site cleaned with Chlora prep applicators x 2, allowed to dry, and Sorbaview dressing with Silverlon patch applied. Covered with 1 large tegaderm. Exit site healed and incorporated, the velour is exposed approx 1" at exit site. No redness, tenderness, drainage, foul odor or rash noted. Drive line anchor re-applied. Pt denies fever or chills. Pt has sufficient dressing kits at home. Pt was instructed that he may only shower prior to coming to clinic.   Plan: Return to VAD Clinic next Tuesday for dressing change and INR  Simmie Davies RN,BSN VAD Coordinator  Office: [phone]  24/7 Pager: [phone]

## 2023-09-06 ENCOUNTER — Other Ambulatory Visit (HOSPITAL_COMMUNITY): Payer: Self-pay

## 2023-09-10 ENCOUNTER — Other Ambulatory Visit (HOSPITAL_COMMUNITY): Payer: Self-pay | Admitting: Unknown Physician Specialty

## 2023-09-10 DIAGNOSIS — Z7901 Long term (current) use of anticoagulants: Secondary | ICD-10-CM

## 2023-09-10 DIAGNOSIS — Z95811 Presence of heart assist device: Secondary | ICD-10-CM

## 2023-09-11 ENCOUNTER — Ambulatory Visit (HOSPITAL_BASED_OUTPATIENT_CLINIC_OR_DEPARTMENT_OTHER)
Admission: RE | Admit: 2023-09-11 | Discharge: 2023-09-11 | Disposition: A | Discharge status: Home / Self Care | Source: Ambulatory Visit | Attending: Internal Medicine | Admitting: Internal Medicine

## 2023-09-11 ENCOUNTER — Encounter (HOSPITAL_COMMUNITY): Payer: Self-pay | Admitting: Internal Medicine

## 2023-09-11 ENCOUNTER — Encounter (HOSPITAL_COMMUNITY)

## 2023-09-11 ENCOUNTER — Ambulatory Visit (HOSPITAL_COMMUNITY)
Admission: RE | Admit: 2023-09-11 | Discharge: 2023-09-11 | Disposition: A | Discharge status: Home / Self Care | Source: Ambulatory Visit | Attending: Internal Medicine | Admitting: Internal Medicine

## 2023-09-11 ENCOUNTER — Ambulatory Visit (HOSPITAL_COMMUNITY): Payer: Self-pay | Admitting: Pharmacist

## 2023-09-11 ENCOUNTER — Other Ambulatory Visit (HOSPITAL_COMMUNITY): Payer: Self-pay

## 2023-09-11 VITALS — BP 116/0 | HR 56

## 2023-09-11 DIAGNOSIS — E785 Hyperlipidemia, unspecified: Secondary | ICD-10-CM | POA: Diagnosis not present

## 2023-09-11 DIAGNOSIS — I5043 Acute on chronic combined systolic (congestive) and diastolic (congestive) heart failure: Secondary | ICD-10-CM | POA: Diagnosis not present

## 2023-09-11 DIAGNOSIS — I2510 Atherosclerotic heart disease of native coronary artery without angina pectoris: Secondary | ICD-10-CM | POA: Diagnosis not present

## 2023-09-11 DIAGNOSIS — I130 Hypertensive heart and chronic kidney disease with heart failure and stage 1 through stage 4 chronic kidney disease, or unspecified chronic kidney disease: Secondary | ICD-10-CM | POA: Insufficient documentation

## 2023-09-11 DIAGNOSIS — Z955 Presence of coronary angioplasty implant and graft: Secondary | ICD-10-CM | POA: Insufficient documentation

## 2023-09-11 DIAGNOSIS — I252 Old myocardial infarction: Secondary | ICD-10-CM | POA: Diagnosis not present

## 2023-09-11 DIAGNOSIS — I480 Paroxysmal atrial fibrillation: Secondary | ICD-10-CM | POA: Insufficient documentation

## 2023-09-11 DIAGNOSIS — I10 Essential (primary) hypertension: Secondary | ICD-10-CM

## 2023-09-11 DIAGNOSIS — I255 Ischemic cardiomyopathy: Secondary | ICD-10-CM | POA: Diagnosis not present

## 2023-09-11 DIAGNOSIS — Z95811 Presence of heart assist device: Secondary | ICD-10-CM | POA: Insufficient documentation

## 2023-09-11 DIAGNOSIS — Z7901 Long term (current) use of anticoagulants: Secondary | ICD-10-CM | POA: Insufficient documentation

## 2023-09-11 DIAGNOSIS — Z95810 Presence of automatic (implantable) cardiac defibrillator: Secondary | ICD-10-CM | POA: Insufficient documentation

## 2023-09-11 DIAGNOSIS — I472 Ventricular tachycardia, unspecified: Secondary | ICD-10-CM | POA: Diagnosis not present

## 2023-09-11 DIAGNOSIS — I5022 Chronic systolic (congestive) heart failure: Secondary | ICD-10-CM

## 2023-09-11 DIAGNOSIS — Z4509 Encounter for adjustment and management of other cardiac device: Secondary | ICD-10-CM | POA: Diagnosis present

## 2023-09-11 DIAGNOSIS — I509 Heart failure, unspecified: Secondary | ICD-10-CM | POA: Diagnosis present

## 2023-09-11 DIAGNOSIS — N1831 Chronic kidney disease, stage 3a: Secondary | ICD-10-CM | POA: Diagnosis not present

## 2023-09-11 LAB — COMPREHENSIVE METABOLIC PANEL
ALT: 19 U/L (ref 0–44)
AST: 30 U/L (ref 15–41)
Albumin: 3.5 g/dL (ref 3.5–5.0)
Alkaline Phosphatase: 66 U/L (ref 38–126)
Anion gap: 9 (ref 5–15)
BUN: 13 mg/dL (ref 8–23)
CO2: 23 mmol/L (ref 22–32)
Calcium: 8.6 mg/dL — ABNORMAL LOW (ref 8.9–10.3)
Chloride: 105 mmol/L (ref 98–111)
Creatinine, Ser: 1.22 mg/dL (ref 0.61–1.24)
GFR, Estimated: >60 mL/min (ref >60)
Glucose, Bld: 101 mg/dL — ABNORMAL HIGH (ref 70–99)
Potassium: 4.1 mmol/L (ref 3.5–5.1)
Sodium: 137 mmol/L (ref 135–145)
Total Bilirubin: 0.9 mg/dL (ref 0.0–1.2)
Total Protein: 6.7 g/dL (ref 6.5–8.1)

## 2023-09-11 LAB — PREALBUMIN: Prealbumin: 20 mg/dL (ref 18–38)

## 2023-09-11 LAB — CBC
HCT: 39.2 % (ref 39.0–52.0)
Hemoglobin: 13.3 g/dL (ref 13.0–17.0)
MCH: 30.7 pg (ref 26.0–34.0)
MCHC: 33.9 g/dL (ref 30.0–36.0)
MCV: 90.5 fL (ref 80.0–100.0)
Platelets: 154 10*3/uL (ref 150–400)
RBC: 4.33 MIL/uL (ref 4.22–5.81)
RDW: 13.3 % (ref 11.5–15.5)
WBC: 7.3 10*3/uL (ref 4.0–10.5)
nRBC: 0 % (ref 0.0–0.2)

## 2023-09-11 LAB — LACTATE DEHYDROGENASE: LDH: 212 U/L — ABNORMAL HIGH (ref 98–192)

## 2023-09-11 LAB — PROTIME-INR
INR: 1.9 — ABNORMAL HIGH (ref 0.8–1.2)
Prothrombin Time: 22.3 s — ABNORMAL HIGH (ref 11.4–15.2)

## 2023-09-11 MED ORDER — LOSARTAN POTASSIUM 50 MG PO TABS
50.0000 mg | ORAL_TABLET | Freq: Every day | ORAL | 3 refills | Status: DC
  Filled 2023-09-11 – 2023-10-01 (×3): qty 90, 90d supply, fill #0

## 2023-09-11 MED ORDER — LOSARTAN POTASSIUM 50 MG PO TABS
50.0000 mg | ORAL_TABLET | Freq: Every day | ORAL | 3 refills | Status: DC
Start: 2023-09-11 — End: 2023-09-11

## 2023-09-11 NOTE — Progress Notes (Signed)
 LVAD CLINIC NOTE  PCP: [name], MD HF doc: DB  Chief complaint: HF  HPI:  Steven [name] is a 68 y.o. 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 Underwent HM-3 VAD placement 0n 03/08/23   Originally from [place], suffered a large out of hospital anterior wall myocardial infarction January 2012.  He presented after a three-hour delay to the hospital and was taken emergently for cath with DES to LAD. EF was 35-40% at time of MI. Unfortunately, did not have recovery of EF. C/w progressive decline in systolic function over the years. Ultimately underwent ICD.    Admitted on 02/26/23 w/ NYHA Class IV symptoms and low output. Echo showed EF < 20%, RV mildly reduced. RHC showed with low output. CI 2.1. Calcified pericardium (no H/o TB). Underwent HMIII LVAD implant on 03/08/23. Intra-op TEE LVEF 15%, RV normal.  Post op course was c/b afib w/ RVR, requiring IV amiodarone. Converted back to NSR.  Seen on 05/22/23 for unscheduled f/u due to low flow alarms on VAD. Pacer reprogrammed to let intrinsic rate come through (was RV pacing at 80). Feels much better. Walking 40 mins every day in the park. Denies orthopnea or PND. No fevers, chills or problems with driveline. No bleeding, melena or neuro symptoms. No VAD alarms. Taking all meds as prescribed.   Here for routine f/u with ramp echo. Back to working 40 hour per week. Feeling good. Occasional lightheadedness. Tolerating losartan 25. Denies orthopnea or PND. No fevers, chills or problems with driveline. No bleeding, melena or neuro symptoms. No VAD alarms. Taking all meds as prescribed.      VAD Indication: Destination Therapy - pt preference, social issues   VAD interrogation & Equipment Management: Speed: 5700 >> 5600 Flow: 4.5 >> 4.2 Power: 4.4 w >> 4.2 w PI: 5.2 >> 5.9   Alarms: none Events: 100+   Fixed  speed 5700 >> 5600 Low speed limit: 5400 >> 5300   Primary Controller:  Replace back up battery in 27 months. Back up controller:  pt left at home- advised again to bring backup bag with him to clinic visits and when he is outside his home.    Annual Equipment Maintenance on UBC/PM was performed on 03/08/23.    I reviewed the LVAD parameters from today and compared the results to the patient's prior recorded data. LVAD interrogation was NEGATIVE for significant power changes, NEGATIVE for clinical alarms and STABLE for PI events/speed drops. No programming changes were made and pump is functioning within specified parameters. Pt is performing daily controller and system monitor self tests along with completing weekly and monthly maintenance for LVAD equipment.   LVAD equipment check completed and is in good working order. Back-up equipment NOT present. Charged back up battery and performed self-test on equipment.   Past Medical History:  Diagnosis Date   Acute MI, anterior wall (HCC)    AICD (automatic cardioverter/defibrillator) present    CAD (coronary artery disease)    2D ECHO, 07/13/2011 - EF <25%, LV moderatelty dilated, LA moderately dilatedLEXISCAN, 12/14/2011 - moderate-severe perfusion defect seen in the basal anteroseptal, mid anterior, apicacl anterior, apical, apical inferior, and apical lateral regions, post-stress EF 25%, new EKG changes from baseline abnormalities   Cancer [facility])    Prostate   CHF (congestive heart failure) (HCC) 2012   Hypertension 08/08/2021   Inguinal hernia, left  Pneumonia    November 2023   Pre-diabetes     Current Outpatient Medications  Medication Sig Dispense Refill   atorvastatin (LIPITOR) 80 MG tablet Take 1 tablet (80 mg total) by mouth daily. 90 tablet 3   cephALEXin (KEFLEX) 500 MG capsule Take 1 capsule (500 mg total) by mouth 2 (two) times daily. 6 capsule 0   docusate sodium (COLACE) 100 MG capsule Take 2 capsules (200 mg total) by mouth  daily. 100 capsule 0   Multiple Vitamins-Minerals (CERTAVITE/ANTIOXIDANTS) TABS Take 1 tablet by mouth daily. 130 tablet 0   pantoprazole (PROTONIX) 40 MG tablet Take 1 tablet (40 mg total) by mouth daily. 30 tablet 6   tamsulosin (FLOMAX) 0.4 MG CAPS capsule Take 1 capsule (0.4 mg total) by mouth daily after supper. 30 capsule 0   warfarin (COUMADIN) 2.5 MG tablet Take 1 (one) tablet by mouth on Mon, Wed, Fri, Sat and Sunday with supper. Take 2 (two) tablets on Tue and Thur with supper. 50 tablet 3   finasteride (PROSCAR) 5 MG tablet Take 1 tablet (5 mg total) by mouth daily. 30 tablet 6   losartan (COZAAR) 50 MG tablet Take 1 tablet (50 mg total) by mouth at bedtime. 90 tablet 3   torsemide (DEMADEX) 20 MG tablet Take 1 tablet (20 mg total) by mouth daily as needed. (Patient not taking: Reported on 07/26/2023) 30 tablet 0   traMADol (ULTRAM) 50 MG tablet Take 1 tablet (50 mg total) by mouth every 4 (four) hours as needed for severe pain. (Patient not taking: Reported on 09/11/2023) 35 tablet 0   traZODone (DESYREL) 50 MG tablet Take 1 tablet (50 mg total) by mouth at bedtime as needed for sleep. (Patient not taking: Reported on 09/11/2023) 30 tablet 0   No current facility-administered medications for this encounter.    Patient has no known allergies.  REVIEW OF SYSTEMS: All systems negative except as listed in HPI, PMH and Problem list.    Vital Signs:  Doppler Pressure: 116                 Automatic BP: 113/95 (102) HR: 56 SPO2: 98%   Weight: 170 lb w/ eqt Last weight: 170 lb w/o eqt  Vitals:   09/11/23 1037  BP: (!) 113/95  Pulse: (!) 56  SpO2: 98%    Physical Exam: General:  NAD.  HEENT: normal  Neck: supple. JVP not elevated.  Carotids 2+ bilat; no bruits. No lymphadenopathy or thryomegaly appreciated. Cor: LVAD hum.  Lungs: Clear. Abdomen:  soft, nontender, non-distended. No hepatosplenomegaly. No bruits or masses. Good bowel sounds. Driveline site clean. Anchor in  place.  Extremities: no cyanosis, clubbing, rash. Warm no edema  Neuro: alert & oriented x 3. No focal deficits. Moves all 4 without problem    ICD: No VT/AF volume ok Personally reviewed  ASSESSMENT AND PLAN:   1. Chronic combined systolic and diastolic heart failure - Due to ICM with anterior MI in 2012 - Echo 02/27/23: EF <20%, LV with GHK, RV mildly reduced, GIIDD, LA mod dilated, mild MR.  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 GDMT limited by renal function and hypotension.  - S/P HM3 LVAD implant 03/08/23.   - Doing well NYHA I-II - Volume status looks good but still having multiple PI events - MAPs high. Did not tolerate Entresto with severe orthostasis. Will not challenge with SGLT2i  - Tolerating losartan 25. Will increase to 50 - Ramp echo today - see beloa  2. VAD  Low flow alarms - VAD interrogation with multiple PI events.  - RAMP echo today with speed turned down from 5700 -> 5600 - LDH 212 - Hgb 13.3 - DL ok - MAP high. Increase losartan - INR 1.9 Goal 2.0-3.0 Discussed warfarin dosing with PharmD personally.  3. 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  - No s/s angina - off asa as above.  - Continue statin   4. CKD stage 3: Creatinine baseline 1.6-1.8 - cardiorenal, resolved w/ LVAD support - Scr stable at 1.22 today   5. H/o VT s/p Medtronic ICD: likely scar mediated - No recent VT. Off maio   6. Paroxysmal Atrial fibrillation:  - In NSR. On warfarin  - INR as above  I spent a total of 50 minutes today: 1) reviewing the patient's medical records including previous charts, labs and recent notes from other providers; 2) examining the patient and counseling them on their medical issues/explaining the plan of care; 3) adjusting meds as needed and 4) ordering lab work or other needed tests.   Arvilla Meres, MD  4:35 PM

## 2023-09-11 NOTE — Progress Notes (Signed)
 Speed  Flow  PI  Power  LVIDD  AI  Aortic opening MR  TR  Septum  RV  VTI (>18cm)  5700 4.4  5.2 4.4 5.5 trace 0/5 trace  trace Bowing left WNL  20.0  5600  4.2 5.9 4.2 5.5 trace 0/5 trace trace Sl. Bow left  17.2  5500  3.8 7.6 4.0 5.4 mild 1/5  trace trace midline  12.0                                             Doppler MAP: 116 Auto cuff BP: 113/95 (102)   Ramp ECHO performed at bedside per Dr Gala Romney   At completion of ramp study, patients primary controller programmed:  Fixed speed: 5600 Low speed limit: 5300    Alyce Pagan RN VAD Coordinator  Office: [phone]  24/7 Pager: [phone]

## 2023-09-11 NOTE — Progress Notes (Addendum)
 Patient presents for 1 week f/u follow up with 6 month Intermacs in VAD Clinic today alone. Reports no problems with VAD equipment or concerns with drive line.   Denies dizziness, falls, shortness of breath, heart failure symptoms, and signs of bleeding. Reports occasional lightheaded episodes that resolve quickly. He went back to work and is working 40 hrs a week.   He is taking all medications as instructed. Completing a course of Keflex per Urology. Lauren PharmD aware. BP remains slightly elevated today. Per Dr Gala Romney will increase Losartan to 50 mg every evening. Updated prescription sent to pt's pharmacy. Discussed he may take 2 Losartan 25 mg tablets until he picks up new 50 mg tablet from the pharmacy. He verbalized understanding. Will check BP next week at dressing change appt.   On pump interrogation at times pt dropping to low speed limit. RAMP echo completed today. See separate note for documentation. Speed decreased to 5600, low speed limit 5300. Advised pt if he feels poorly or experiences alarms with speed change to notify VAD coordinators immediately. He verbalized understanding.   Vital Signs:  Doppler Pressure: 116   Automatic BP: 113/95 (102) HR: 56  SPO2: 98%   Weight: 170 lb w/ eqt Last weight: 170 lb w/o eqt   VAD Indication: Destination Therapy - pt preference, social issues   VAD interrogation & Equipment Management: Speed: 5700 >> 5600 Flow: 4.5 >> 4.2 Power: 4.4 w >> 4.2 w PI: 5.2 >> 5.9   Alarms: none Events: 100+   Fixed speed 5700 >> 5600 Low speed limit: 5400 >> 5300   Primary Controller:  Replace back up battery in 27 months. Back up controller:  pt left at home- advised again to bring backup bag with him to clinic visits and when he is outside his home.    Annual Equipment Maintenance on UBC/PM was performed on 03/08/23.    I reviewed the LVAD parameters from today and compared the results to the patient's prior recorded data. LVAD interrogation  was NEGATIVE for significant power changes, NEGATIVE for clinical alarms and STABLE for PI events/speed drops. No programming changes were made and pump is functioning within specified parameters. Pt is performing daily controller and system monitor self tests along with completing weekly and monthly maintenance for LVAD equipment.   LVAD equipment check completed and is in good working order. Back-up equipment NOT present. Charged back up battery and performed self-test on equipment.    Exit Site Care: Drive line is being maintained once a week by VAD coordinators. Existing VAD dressing removed and site care performed using sterile technique. Drive line exit site cleaned with Chlora prep applicators x 2, allowed to dry, and Sorbaview dressing with Silverlon patch applied. Exit site healed and incorporated, the velour is exposed approx 1" at exit site. No redness, tenderness, drainage, foul odor or rash noted. Drive line anchor re-applied. Pt denies fever or chills. Pt states they have adequate dressing supplies at home.      Significant Events on VAD Support:   Device: Medtronic Therapies: on VF 200 BPM Last check: 08/21/23  BP & Labs:  MAP 116 - Doppler is reflecting modified systolic   Hgb 13.3 - No S/S of bleeding. Specifically denies melena/BRBPR or nosebleeds.   LDH stable at 212 with established baseline of 230-290. Denies tea-colored urine. No power elevations noted on interrogation.   6 month Intermacs follow up completed including:  Quality of Life, KCCQ-12, and Neurocognitive trail making.   Will complete 6  MW at next dressing change visit as pt needs to leave to go to work.  Back up controller: Did not bring today- instructed to bring next visit  Patient Goals: To continue to work full time.  [place] Cardiomyopathy Questionnaire     09/11/2023    3:54 PM 06/05/2023   12:47 PM  KCCQ-12  1 a. Ability to shower/bathe Slightly limited Not at all limited  1 b. Ability  to walk 1 block Not at all limited Not at all limited  1 c. Ability to hurry/jog Other, Did not do Quite a bit limited  2. Edema feet/ankles/legs Never over the past 2 weeks Never over the past 2 weeks  3. Limited by fatigue Never over the past 2 weeks Never over the past 2 weeks  4. Limited by dyspnea Never over the past 2 weeks Never over the past 2 weeks  5. Sitting up / on 3+ pillows Less than once a week Never over the past 2 weeks  6. Limited enjoyment of life Slightly limited Slightly limited  7. Rest of life w/ symptoms Mostly satisfied Mostly satisfied  8 a. Participation in hobbies Slightly limited Did not limit at all  8 b. Participation in chores Did not limit at all Did not limit at all  8 c. Visiting family/friends Did not limit at all Did not limit at all    Plan: Increase Losartan to 50 mg daily. May take 2 of your current tablets in the evening until you run out. When you pick up your new prescription it will be 1 tablet in the evening. Coumadin dosing per Lauren PharmD Return to clinic in 1 week for dressing change Return to clinic in 2 months for follow up with Dr Gala Romney  Alyce Pagan RN VAD Coordinator  Office: [phone]  24/7 Pager: [phone]

## 2023-09-11 NOTE — Patient Instructions (Addendum)
 Increase Losartan to 50 mg daily. May take 2 of your current tablets in the evening until you run out. When you pick up your new prescription it will be 1 tablet in the evening. Coumadin dosing per Leotis Shames PharmD Return to clinic in 1 week for dressing change Return to clinic in 2 months for follow up with Dr Gala Romney Surgery: Implantation of Left Ventricular Assist Device (LVAD) on 03/08/23 (procedure required open heart surgery and prolonged hospital stay)

## 2023-09-12 ENCOUNTER — Encounter (HOSPITAL_COMMUNITY)

## 2023-09-14 ENCOUNTER — Other Ambulatory Visit (HOSPITAL_COMMUNITY): Payer: Self-pay

## 2023-09-14 ENCOUNTER — Other Ambulatory Visit: Payer: Self-pay

## 2023-09-18 ENCOUNTER — Ambulatory Visit (HOSPITAL_COMMUNITY)
Admission: RE | Admit: 2023-09-18 | Discharge: 2023-09-18 | Disposition: A | Discharge status: Home / Self Care | Source: Ambulatory Visit | Attending: Cardiology | Admitting: Cardiology

## 2023-09-18 VITALS — BP 110/0

## 2023-09-18 DIAGNOSIS — Z79899 Other long term (current) drug therapy: Secondary | ICD-10-CM | POA: Diagnosis not present

## 2023-09-18 DIAGNOSIS — Z7901 Long term (current) use of anticoagulants: Secondary | ICD-10-CM | POA: Diagnosis not present

## 2023-09-18 DIAGNOSIS — Z4502 Encounter for adjustment and management of automatic implantable cardiac defibrillator: Secondary | ICD-10-CM | POA: Diagnosis present

## 2023-09-18 DIAGNOSIS — Z4801 Encounter for change or removal of surgical wound dressing: Secondary | ICD-10-CM | POA: Insufficient documentation

## 2023-09-18 DIAGNOSIS — Z95811 Presence of heart assist device: Secondary | ICD-10-CM

## 2023-09-18 NOTE — Progress Notes (Signed)
 Patient presents for nurse visit in VAD Clinic today alone. Reports no problems with VAD equipment or concerns with drive line.   Denies dizziness, falls, shortness of breath, heart failure symptoms, and signs of bleeding. Pt states dizziness at work has improved. Pt confirms increasing Losartan to 50mg  every evening. Pt did not take any medications prior to this appointment.  VAD Interrogated pt has not had any LOW FLOW alarms since 09/03/23.  Vital Signs:  Doppler Pressure: 110   Automatic BP: 107/87 (95) HR: 61  SPO2: 99%  VAD Indication: Destination Therapy - pt preference, social issues   VAD interrogation & Equipment Management: Speed: 5600 Flow: 4.0 Power: 4.1w PI: 5.2   Alarms: none Events: 100+   Fixed speed 5600 Low speed limit: 5300   Primary Controller:  Replace back up battery in 27 months. Back up controller:  pt left at home- advised again to bring backup bag with him to clinic visits and when he is outside his home.    Annual Equipment Maintenance on UBC/PM was performed on 03/08/23.    I reviewed the LVAD parameters from today and compared the results to the patient's prior recorded data. LVAD interrogation was NEGATIVE for significant power changes, NEGATIVE for clinical alarms and STABLE for PI events/speed drops. No programming changes were made and pump is functioning within specified parameters. Pt is performing daily controller and system monitor self tests along with completing weekly and monthly maintenance for LVAD equipment.   LVAD equipment check completed and is in good working order. Back-up equipment NOT present. Charged back up battery and performed self-test on equipment.    Exit Site Care: Drive line is being maintained once a week by VAD coordinators. Existing VAD dressing removed and site care performed using sterile technique. Drive line exit site cleaned with Chlora prep applicators x 2, allowed to dry, and Sorbaview dressing with Silverlon  patch applied. Exit site healed and incorporated, the velour is exposed approx 1" at exit site. No redness, tenderness, drainage, foul odor or rash noted. Drive line anchor re-applied. Pt denies fever or chills. Pt states they have adequate dressing supplies at home.   Pt completed Neurocognitive and at today's visit.   Plan: Return to clinic in 1 week for dressing change Return to clinic in 2 months for follow up with Dr Gala Romney Coumadin dosing per [facility]  Simmie Davies RN,BSN VAD Coordinator  Office: [phone]  24/7 Pager: [phone]

## 2023-09-19 ENCOUNTER — Other Ambulatory Visit (HOSPITAL_COMMUNITY): Payer: Self-pay

## 2023-09-19 DIAGNOSIS — Z7901 Long term (current) use of anticoagulants: Secondary | ICD-10-CM

## 2023-09-19 DIAGNOSIS — Z95811 Presence of heart assist device: Secondary | ICD-10-CM

## 2023-09-25 ENCOUNTER — Encounter (HOSPITAL_COMMUNITY): Payer: 59 | Admitting: Internal Medicine

## 2023-09-25 ENCOUNTER — Ambulatory Visit (HOSPITAL_COMMUNITY): Payer: Self-pay | Admitting: Pharmacist

## 2023-09-25 ENCOUNTER — Ambulatory Visit (HOSPITAL_COMMUNITY)
Admission: RE | Admit: 2023-09-25 | Discharge: 2023-09-25 | Disposition: A | Discharge status: Home / Self Care | Source: Ambulatory Visit | Attending: Internal Medicine | Admitting: Internal Medicine

## 2023-09-25 DIAGNOSIS — Z95811 Presence of heart assist device: Secondary | ICD-10-CM | POA: Insufficient documentation

## 2023-09-25 DIAGNOSIS — Z7901 Long term (current) use of anticoagulants: Secondary | ICD-10-CM | POA: Diagnosis not present

## 2023-09-25 DIAGNOSIS — Z452 Encounter for adjustment and management of vascular access device: Secondary | ICD-10-CM | POA: Insufficient documentation

## 2023-09-25 LAB — PROTIME-INR
INR: 1.9 — ABNORMAL HIGH (ref 0.8–1.2)
Prothrombin Time: 21.7 s — ABNORMAL HIGH (ref 11.4–15.2)

## 2023-09-25 NOTE — Progress Notes (Signed)
 Patient presents for dressing change in VAD Clinic today alone. Reports no problems with VAD equipment or concerns with drive line.    Exit Site Care: Drive line is being maintained once a week by VAD coordinators. Existing VAD dressing removed and site care performed using sterile technique. Drive line exit site cleaned with Chlora prep applicators x 2, allowed to dry, and Sorbaview dressing with Silverlon patch applied. Exit site healed and incorporated, the velour is exposed approx 1" at exit site. No redness, tenderness, drainage, foul odor or rash noted. Drive line anchor re-applied. Pt denies fever or chills. Pt states they have adequate dressing supplies at home.    Plan: Return to clinic in 1 week for dressing change Coumadin dosing per [facility]  Carlton Adam RN,BSN VAD Coordinator  Office: [phone]  24/7 Pager: [phone]

## 2023-09-26 NOTE — Addendum Note (Signed)
 Addended by: Elease Etienne A on: 09/26/2023 10:24 AM   Modules accepted: Orders

## 2023-09-26 NOTE — Progress Notes (Signed)
 Remote ICD transmission.

## 2023-10-01 ENCOUNTER — Other Ambulatory Visit (HOSPITAL_COMMUNITY): Payer: Self-pay

## 2023-10-01 ENCOUNTER — Other Ambulatory Visit (HOSPITAL_COMMUNITY): Payer: Self-pay | Admitting: Internal Medicine

## 2023-10-01 ENCOUNTER — Other Ambulatory Visit: Payer: Self-pay

## 2023-10-02 ENCOUNTER — Other Ambulatory Visit: Payer: Self-pay

## 2023-10-02 ENCOUNTER — Ambulatory Visit (HOSPITAL_COMMUNITY)
Admission: RE | Admit: 2023-10-02 | Discharge: 2023-10-02 | Disposition: A | Discharge status: Home / Self Care | Source: Ambulatory Visit | Attending: Cardiology | Admitting: Cardiology

## 2023-10-02 DIAGNOSIS — Z4509 Encounter for adjustment and management of other cardiac device: Secondary | ICD-10-CM | POA: Insufficient documentation

## 2023-10-02 DIAGNOSIS — Z95811 Presence of heart assist device: Secondary | ICD-10-CM | POA: Insufficient documentation

## 2023-10-02 MED ORDER — WARFARIN SODIUM 2.5 MG PO TABS
ORAL_TABLET | ORAL | 3 refills | Status: DC
  Filled 2023-10-02: qty 50, 39d supply, fill #0
  Filled 2023-11-10 – 2023-11-12 (×2): qty 50, 39d supply, fill #1
  Filled 2023-12-14: qty 50, 39d supply, fill #2
  Filled 2024-01-11: qty 50, 39d supply, fill #3

## 2023-10-02 NOTE — Progress Notes (Signed)
 Patient presents for dressing change in VAD Clinic today alone. Reports no problems with VAD equipment or concerns with drive line.    Exit Site Care: Drive line is being maintained once a week by VAD coordinators. Existing VAD dressing removed and site care performed using sterile technique. Drive line exit site cleaned with Chlora prep applicators x 2, allowed to dry, and Sorbaview dressing with Silverlon patch applied. Exit site healed and incorporated, the velour is exposed approx 1" at exit site. No redness, tenderness, drainage, foul odor or rash noted. Drive line anchor re-applied. Pt denies fever or chills. Pt states they have adequate dressing supplies at home.    Plan: Return to clinic in 1 week for dressing change & INR   Alyce Pagan RN VAD Coordinator  Office: [phone]  24/7 Pager: [phone]

## 2023-10-03 ENCOUNTER — Encounter: Payer: Self-pay | Admitting: Cardiovascular Disease

## 2023-10-03 ENCOUNTER — Telehealth: Payer: Self-pay

## 2023-10-03 ENCOUNTER — Ambulatory Visit: Attending: Cardiovascular Disease | Admitting: Cardiovascular Disease

## 2023-10-03 VITALS — HR 73 | Ht 70.0 in | Wt 169.8 lb

## 2023-10-03 DIAGNOSIS — I5042 Chronic combined systolic (congestive) and diastolic (congestive) heart failure: Secondary | ICD-10-CM | POA: Diagnosis not present

## 2023-10-03 DIAGNOSIS — E785 Hyperlipidemia, unspecified: Secondary | ICD-10-CM

## 2023-10-03 DIAGNOSIS — Z79899 Other long term (current) drug therapy: Secondary | ICD-10-CM

## 2023-10-03 DIAGNOSIS — I10 Essential (primary) hypertension: Secondary | ICD-10-CM

## 2023-10-03 DIAGNOSIS — I2510 Atherosclerotic heart disease of native coronary artery without angina pectoris: Secondary | ICD-10-CM

## 2023-10-03 DIAGNOSIS — I4901 Ventricular fibrillation: Secondary | ICD-10-CM

## 2023-10-03 DIAGNOSIS — Z95810 Presence of automatic (implantable) cardiac defibrillator: Secondary | ICD-10-CM

## 2023-10-03 DIAGNOSIS — I4891 Unspecified atrial fibrillation: Secondary | ICD-10-CM

## 2023-10-03 DIAGNOSIS — Z95811 Presence of heart assist device: Secondary | ICD-10-CM | POA: Diagnosis not present

## 2023-10-03 DIAGNOSIS — D6869 Other thrombophilia: Secondary | ICD-10-CM

## 2023-10-03 DIAGNOSIS — I9789 Other postprocedural complications and disorders of the circulatory system, not elsewhere classified: Secondary | ICD-10-CM

## 2023-10-03 LAB — MAGNESIUM: Magnesium: 1.9 mg/dL (ref 1.6–2.3)

## 2023-10-03 LAB — BASIC METABOLIC PANEL WITH GFR
BUN/Creatinine Ratio: 11 (ref 10–24)
BUN: 13 mg/dL (ref 8–27)
CO2: 19 mmol/L — ABNORMAL LOW (ref 20–29)
Calcium: 9.2 mg/dL (ref 8.6–10.2)
Chloride: 105 mmol/L (ref 96–106)
Creatinine, Ser: 1.21 mg/dL (ref 0.76–1.27)
Glucose: 106 mg/dL — ABNORMAL HIGH (ref 70–99)
Potassium: 4.7 mmol/L (ref 3.5–5.2)
Sodium: 141 mmol/L (ref 134–144)
eGFR: 65 mL/min/{1.73_m2} (ref >59)

## 2023-10-03 NOTE — Telephone Encounter (Signed)
 True VF, following greater than usual physical activity. Terminated with a single shock. Asymptomatic during the event, but felt the shock. Made VF detection less aggressive since he has LVAD, but left detection and therapies on. Checking labs. D/W CHF team, Dr. Gala Romney.

## 2023-10-03 NOTE — Patient Instructions (Signed)
 Medication Instructions:  No changes *If you need a refill on your cardiac medications before your next appointment, please call your pharmacy*  Lab Work: BMP, Mag If you have labs (blood work) drawn today and your tests are completely normal, you will receive your results only by: MyChart Message (if you have MyChart) OR A paper copy in the mail If you have any lab test that is abnormal or we need to change your treatment, we will call you to review the results.  Follow-Up: At [facility], you and your health needs are our priority.  As part of our continuing mission to provide you with exceptional heart care, our providers are all part of one team.  This team includes your primary Cardiologist (physician) and Advanced Practice Providers or APPs (Physician Assistants and Nurse Practitioners) who all work together to provide you with the care you need, when you need it.  Your next appointment:    1 yr- device check  Provider:   Dr Royann Shivers  We recommend signing up for the patient portal called "MyChart".  Sign up information is provided on this After Visit Summary.  MyChart is used to connect with patients for Virtual Visits (Telemedicine).  Patients are able to view lab/test results, encounter notes, upcoming appointments, etc.  Non-urgent messages can be sent to your provider as well.   To learn more about what you can do with MyChart, go to [email or web address].        1st Floor: - Lobby - Registration  - Pharmacy  - Lab - Cafe  2nd Floor: - PV Lab - Diagnostic Testing (echo, CT, nuclear med)  3rd Floor: - Vacant  4th Floor: - TCTS (cardiothoracic surgery) - AFib Clinic - Structural Heart Clinic - Vascular Surgery  - Vascular Ultrasound  5th Floor: - HeartCare Cardiology (general and EP) - Clinical Pharmacy for coumadin, hypertension, lipid, weight-loss medications, and med management appointments    Valet parking services will be available as  well.

## 2023-10-03 NOTE — Telephone Encounter (Signed)
 Alert remote transmission:  VF with successful HV therapy 4/9 @ 00:27, duration 10sec per device, HR 316, EGM c/w polymorphic VT, HV therapy 25J converting to regular AP/VS HF diagnostics currently abnormal.    Patient reports he just came in from work, fixing something to eat when he felt the shock. No symptoms then or at present. He did not lose consciousness.  Dr. Royann Shivers reviewed and wants to see patient this am in office.  Patient aware and will be there at 10:00am.  I explained [place] DMV driving restrictions - no driving for 6months following a VT/Shock event due to the dangerous rhythm with therapy and high risk for losing consciousness while driving that could harm himself and or others.  Patient acknowledged this but, pleasantly, did not seem to agree, he will likely be driving himself today, despite the clear directive.

## 2023-10-04 ENCOUNTER — Encounter: Payer: Self-pay | Admitting: Cardiovascular Disease

## 2023-10-05 ENCOUNTER — Other Ambulatory Visit (HOSPITAL_COMMUNITY): Payer: Self-pay | Admitting: *Deleted

## 2023-10-05 DIAGNOSIS — Z7901 Long term (current) use of anticoagulants: Secondary | ICD-10-CM

## 2023-10-05 DIAGNOSIS — Z95811 Presence of heart assist device: Secondary | ICD-10-CM

## 2023-10-07 ENCOUNTER — Encounter: Payer: Self-pay | Admitting: Cardiovascular Disease

## 2023-10-07 NOTE — Progress Notes (Signed)
 Patient ID: Steven [name], male   DOB: 27-Dec-1955, 68 y.o.   MRN: [id number]    Cardiology Office Note    Date:  10/07/2023   ID:  [name], [name] 10-02-1955, MRN [id number]  PCP:  Ziglar, Susan K, MD  Cardiologist:  Magnus Schuller, M.D.; Luana Rumple, MD   Chief Complaint  Patient presents with   ICD shock    History of Present Illness:  Steven [name] is a 68 y.o. male with severe ischemic cardiomyopathy following an extensive infarction in the LAD artery territory complicated by severe left ventricular systolic dysfunction, left ventricular apical thrombus and ventricular tachycardia. He received a defibrillator for primary prevention in 2012 [facility], generator change 01/2019, leads 2012). He received appropriate shocks for ventricular tachycardia in July and August 2024 in the setting of deteriorating left ventricular function.  He underwent destination therapy HeartMate 3 LVAD implantation 03/08/2023, was discharged to inpatient rehab on 03/20/2023 and then discharged to home 03/27/2023.  He had some brief problems with postoperative atrial fibrillation and atrial flutter and was started on amiodarone.  Additional medical problems include a history of treated prostate cancer, subdural hematoma following syncope related to VT several years ago, and the findings of calcified pericardium at the time of his LVAD surgery.  He has done remarkably well since about implantation.  He lives independently with support from his daughter and ex-wife and has returned to work as a Arboriculturist.  He comes in today for evaluation after an ICD shock.  He was driving home when he accidentally snagged cable under his hood on the road.  When he arrived home he found that he had dragged a very long segment of cable, maybe 100 feet long.  He gradually curled up the cable to put it into his trash can a process which took 10 or 20 minutes and was fairly strenuous.  After finishing this and going into the house he  felt a defibrillator shock.  He was not dizzy and did not have symptoms of near syncope.  Device interrogation shows extremely rapid ventricular fibrillation well over 300 bpm, terminated with a single device shock.  Due to the very rapid cycle length antitachycardia pacing was not delivered.  Discharge was appropriate and device function is normal.  Estimated transfer Ingevity is just under 6 years.  He almost never requires either atrial or ventricular pacing.  Lead parameters are all excellent.  Presenting rhythm was atrial paced, ventricular sensed, transitioning to atrial sensed, ventricular sensed (normal sinus rhythm).  Thoracic impedance/OptiVol is in normal and stable range.  He has not had any episodes of atrial fibrillation or other episodes of high ventricular rate except the one described above.  He has not had any recent febrile illness, edema, worsening shortness of breath, angina at rest or with activity, dizziness palpitations or syncope.  Past Medical History:  Diagnosis Date   Acute MI, anterior wall (HCC)    AICD (automatic cardioverter/defibrillator) present    CAD (coronary artery disease)    2D ECHO, 07/13/2011 - EF <25%, LV moderatelty dilated, LA moderately dilatedLEXISCAN, 12/14/2011 - moderate-severe perfusion defect seen in the basal anteroseptal, mid anterior, apicacl anterior, apical, apical inferior, and apical lateral regions, post-stress EF 25%, new EKG changes from baseline abnormalities   Cancer [facility])    Prostate   CHF (congestive heart failure) (HCC) 2012   Hypertension 08/08/2021   Inguinal hernia, left    Pneumonia    November 2023   Pre-diabetes  Past Surgical History:  Procedure Laterality Date   CARDIAC CATHETERIZATION  11/25/2010   Predilation balloon-Apex monorail 2x35mm, Cutting balloon-2.25x42mm, resulting in a reduction of 100% stenosis down to less than 10%   CARDIAC CATHETERIZATION  07/12/2010   LAD stented with a 3.5x57mm bare-metal  Veriflex stent resulting in a reduction of 100% lesion to 0%   CARDIAC DEFIBRILLATOR PLACEMENT  03/02/2011   Medtronic Protecta XT DR, model #Z610RUE, serial #[id number]   COLONOSCOPY     GOLD SEED IMPLANT N/A 07/21/2021   Procedure: GOLD SEED IMPLANT;  Surgeon: Florencio Hunting, MD;  Location: [facility] ORS;  Service: Urology;  Laterality: N/A;  NEEDS 30 MIN   HERNIA REPAIR  2018   ICD GENERATOR CHANGEOUT N/A 02/24/2019   Procedure: ICD GENERATOR CHANGEOUT;  Surgeon: Luana Rumple, MD;  Location: [facility] INVASIVE CV LAB;  Service: Cardiovascular;  Laterality: N/A;   INSERTION OF IMPLANTABLE LEFT VENTRICULAR ASSIST DEVICE N/A 03/08/2023   Procedure: INSERTION OF IMPLANTABLE LEFT VENTRICULAR ASSIST DEVICE;  Surgeon: Bartley Lightning, MD;  Location: [facility] OR;  Service: Open Heart Surgery;  Laterality: N/A;   PACEMAKER PLACEMENT  07/14/2010   Temporary placement of pacemaker, if rhythm issue continues will need a permanent device   RIGHT HEART CATH N/A 02/28/2023   Procedure: RIGHT HEART CATH;  Surgeon: Wenona Hamilton, MD;  Location: [facility] INVASIVE CV LAB;  Service: Cardiovascular;  Laterality: N/A;   RIGHT HEART CATH N/A 03/06/2023   Procedure: RIGHT HEART CATH;  Surgeon: Alwin Baars, DO;  Location: [facility] INVASIVE CV LAB;  Service: Cardiovascular;  Laterality: N/A;   SPACE OAR INSTILLATION N/A 07/21/2021   Procedure: SPACE OAR INSTILLATION;  Surgeon: Florencio Hunting, MD;  Location: [facility] ORS;  Service: Urology;  Laterality: N/A;   TEE WITHOUT CARDIOVERSION N/A 03/08/2023   Procedure: TRANSESOPHAGEAL ECHOCARDIOGRAM;  Surgeon: Bartley Lightning, MD;  Location: [facility] OR;  Service: Open Heart Surgery;  Laterality: N/A;    Outpatient Medications Prior to Visit  Medication Sig Dispense Refill   atorvastatin (LIPITOR) 80 MG tablet Take 1 tablet (80 mg total) by mouth daily. 90 tablet 3   docusate sodium (COLACE) 100 MG capsule Take 2 capsules (200 mg total) by mouth daily. 100 capsule 0   finasteride (PROSCAR) 5 MG tablet  Take 1 tablet (5 mg total) by mouth daily. 30 tablet 6   losartan (COZAAR) 50 MG tablet Take 1 tablet (50 mg total) by mouth at bedtime. 90 tablet 3   Multiple Vitamins-Minerals (CERTAVITE/ANTIOXIDANTS) TABS Take 1 tablet by mouth daily. 130 tablet 0   pantoprazole (PROTONIX) 40 MG tablet Take 1 tablet (40 mg total) by mouth daily. 30 tablet 6   tamsulosin (FLOMAX) 0.4 MG CAPS capsule Take 1 capsule (0.4 mg total) by mouth daily after supper. 30 capsule 0   warfarin (COUMADIN) 2.5 MG tablet Take 1 (one) tablet by mouth on Mon, Wed, Fri, Sat and Sunday with supper. Take 2 (two) tablets on Tue and Thur with supper. 50 tablet 3   cephALEXin (KEFLEX) 500 MG capsule Take 1 capsule (500 mg total) by mouth 2 (two) times daily. (Patient not taking: Reported on 10/03/2023) 6 capsule 0   torsemide (DEMADEX) 20 MG tablet Take 1 tablet (20 mg total) by mouth daily as needed. (Patient not taking: Reported on 10/03/2023) 30 tablet 0   traMADol (ULTRAM) 50 MG tablet Take 1 tablet (50 mg total) by mouth every 4 (four) hours as needed for severe pain. (Patient not taking: Reported on 10/03/2023) 35 tablet 0  traZODone (DESYREL) 50 MG tablet Take 1 tablet (50 mg total) by mouth at bedtime as needed for sleep. (Patient not taking: Reported on 10/03/2023) 30 tablet 0   No facility-administered medications prior to visit.     Allergies:   Patient has no known allergies.   Social History   Socioeconomic History   Marital status: Single    Spouse name: Not on file   Number of children: 1   Years of education: Not on file   Highest education level: Bachelor's degree (e.g., BA, AB, BS)  Occupational History   Occupation: works on Systems analyst  Tobacco Use   Smoking status: Former    Current packs/day: 0.00    Types: Cigarettes, Cigars    Quit date: 2012    Years since quitting: 13.2   Smokeless tobacco: Never  Vaping Use   Vaping status: Never Used  Substance and Sexual Activity   Alcohol use: Yes    Alcohol/week:  2.0 - 3.0 standard drinks of alcohol    Types: 2 - 3 drink(s) per week   Drug use: No   Sexual activity: Not Currently  Other Topics Concern   Not on file  Social History Narrative   Not on file   Social Drivers of Health   Financial Resource Strain: Medium Risk (06/26/2023)   Overall Financial Resource Strain (CARDIA)    Difficulty of Paying Living Expenses: Somewhat hard  Food Insecurity: No Food Insecurity (07/25/2023)   Hunger Vital Sign    Worried About Running Out of Food in the Last Year: Never true    Ran Out of Food in the Last Year: Never true  Transportation Needs: No Transportation Needs (07/25/2023)   PRAPARE - Administrator, Civil Service (Medical): No    Lack of Transportation (Non-Medical): No  Physical Activity: Not on file  Stress: Not on file  Social Connections: Unknown (11/08/2021)   [facility] from [facility]   Social Network    Social Network: Not on file     ROS:   Please see the history of present illness.    ROS All other systems reviewed and are negative.   Studies Reviewed: Aaron Aas   EKG Interpretation Date/Time:  Thursday March 29 2023 08:38:09 EDT Ventricular Rate:  80 PR Interval:  238 QRS Duration:  120 QT Interval:  580 QTC Calculation: 668 R Axis:   -82  Text Interpretation: Poor data quality, interpretation may be adversely affected (LVAD artifact) Atrial-paced rhythm with prolonged AV conduction Left axis deviation Inferior infarct , age undetermined Anterolateral infarct , age undetermined When compared with ECG of 18-Mar-2023 11:31, Electronic atrial pacemaker has replaced Electronic ventricular pacemaker Confirmed by Naithan Delage [phone]) on 03/29/2023 9:07:02 AM     Risk Assessment/Calculations:    CHA2DS2-VASc Score =     This indicates a  % annual risk of stroke. The patient's score is based upon:        PHYSICAL EXAM:   VS:  Pulse 73   Ht 5\' 10"  (1.778 m)   Wt 169 lb 12.8 oz (77 kg)   SpO2  96%   BMI 24.36 kg/m   mean blood pressure roughly 80 mmHg.   General: Alert, oriented x3, no distress.  Healthy LVAD Drive site Head: no evidence of trauma, PERRL, EOMI, no exophtalmos or lid lag, no myxedema, no xanthelasma; normal ears, nose and oropharynx Neck: normal jugular venous pulsations and no hepatojugular reflux; brisk carotid pulses without delay and no carotid bruits  Chest: clear to auscultation, no signs of consolidation by percussion or palpation, normal fremitus, symmetrical and full respiratory excursions Cardiovascular: normal position and quality of the apical impulse, regular rhythm, normal first and second heart sounds, no murmurs, rubs or gallops Abdomen: no tenderness or distention, no masses by palpation, no abnormal pulsatility or arterial bruits, normal bowel sounds, no hepatosplenomegaly Extremities: no clubbing, cyanosis or edema; occasionally seems to have a palpable radial pulse Neurological: grossly nonfocal Psych: Normal mood and affect    Wt Readings from Last 3 Encounters:  10/03/23 169 lb 12.8 oz (77 kg)  07/26/23 170 lb (77.1 kg)  07/25/23 174 lb 12.8 oz (79.3 kg)      Studies/Labs Reviewed:      Latest Ref Rng & Units 10/03/2023   10:52 AM 09/11/2023   10:10 AM 09/04/2023   10:33 AM  BMP  Glucose 70 - 99 mg/dL 604  540  981   BUN 8 - 27 mg/dL 13  13  19    Creatinine 0.76 - 1.27 mg/dL 1.91  4.78  2.95   BUN/Creat Ratio 10 - 24 11     Sodium 134 - 144 mmol/L 141  137  137   Potassium 3.5 - 5.2 mmol/L 4.7  4.1  4.2   Chloride 96 - 106 mmol/L 105  105  107   CO2 20 - 29 mmol/L 19  23  26    Calcium 8.6 - 10.2 mg/dL 9.2  8.6  8.9      Lipid Panel    Component Value Date/Time   CHOL 109 07/26/2023 1414   CHOL 161 07/01/2013 0838   TRIG 74 07/26/2023 1414   TRIG 108 07/01/2013 0838   HDL 44 07/26/2023 1414   HDL 43 07/01/2013 0838   CHOLHDL 2.5 07/26/2023 1414   CHOLHDL 3.1 03/02/2023 0500   VLDL 7 03/02/2023 0500   LDLCALC 50  07/26/2023 1414   LDLCALC 96 07/01/2013 0838    ASSESSMENT:    1. Ventricular fibrillation (HCC)   2. Chronic combined systolic and diastolic heart failure (HCC)   3. LVAD (left ventricular assist device) present (HCC)   4. Coronary artery disease involving native coronary artery of native heart without angina pectoris   5. Postoperative atrial fibrillation (HCC)   6. Implantable cardioverter-defibrillator (ICD) in situ   7. Acquired thrombophilia (HCC)   8. Hyperlipidemia LDL goal <70   9. Medication management       PLAN:  In order of problems listed above:  VF: This is his first meaningful episode of ventricular arrhythmia since LVAD implantation.  It occurred in the setting of greater than usual physical activity.  He did not have syncope.  Labs checked today are normal range.  He does not have any symptoms of acute coronary insufficiency.  Made device settings less aggressive since he has an LVAD and has preserved cardiac output he went to ventricular arrhythmia.  Set detection at 60/80 beats.   CHF s/p LVAD: He looks remarkably well.  Clinically euvolemic and thoracic impedance is stable.  Excellent functional status. CAD: Not had angina pectoris.  ECG is difficult to interpret due to LVAD artifact, but does not appear to show any acute repolarization changes. AFib: Had a lot of atrial fibrillation in the postoperative period but this has not recurred recently.  No longer on amiodarone. ICD: Normal device function.  Will leave therapies on for the time being but made the detection settings settings less aggressive. Anticoagulation: Denies bleeding problems.  INR  today borderline low at 1.9.  Warfarin dose has been adjusted and he will have a recheck INR next week. HLP: on statin, LDL 50 is at target.  D/W Dr. Bensimhon   Medication Adjustments/Labs and Tests Ordered: Current medicines are reviewed at length with the patient today.  Concerns regarding medicines are outlined  above.  Medication changes, Labs and Tests ordered today are listed in the Patient Instructions below. Patient Instructions  Medication Instructions:  No changes *If you need a refill on your cardiac medications before your next appointment, please call your pharmacy*  Lab Work: BMP, Mag If you have labs (blood work) drawn today and your tests are completely normal, you will receive your results only by: MyChart Message (if you have MyChart) OR A paper copy in the mail If you have any lab test that is abnormal or we need to change your treatment, we will call you to review the results.  Follow-Up: At [facility], you and your health needs are our priority.  As part of our continuing mission to provide you with exceptional heart care, our providers are all part of one team.  This team includes your primary Cardiologist (physician) and Advanced Practice Providers or APPs (Physician Assistants and Nurse Practitioners) who all work together to provide you with the care you need, when you need it.  Your next appointment:    1 yr- device check  Provider:   Dr Alvis Ba  We recommend signing up for the patient portal called "MyChart".  Sign up information is provided on this After Visit Summary.  MyChart is used to connect with patients for Virtual Visits (Telemedicine).  Patients are able to view lab/test results, encounter notes, upcoming appointments, etc.  Non-urgent messages can be sent to your provider as well.   To learn more about what you can do with MyChart, go to [email or web address].        1st Floor: - Lobby - Registration  - Pharmacy  - Lab - Cafe  2nd Floor: - PV Lab - Diagnostic Testing (echo, CT, nuclear med)  3rd Floor: - Vacant  4th Floor: - TCTS (cardiothoracic surgery) - AFib Clinic - Structural Heart Clinic - Vascular Surgery  - Vascular Ultrasound  5th Floor: - HeartCare Cardiology (general and EP) - Clinical Pharmacy for coumadin,  hypertension, lipid, weight-loss medications, and med management appointments    Valet parking services will be available as well.         Signed, Luana Rumple, MD  10/07/2023 5:08 PM    [facility] [address] [place], [place], [place]  [address] Phone: [phone]; Fax: [phone]

## 2023-10-09 ENCOUNTER — Ambulatory Visit (HOSPITAL_COMMUNITY)
Admission: RE | Admit: 2023-10-09 | Discharge: 2023-10-09 | Disposition: A | Discharge status: Home / Self Care | Source: Ambulatory Visit | Attending: Cardiology | Admitting: Cardiology

## 2023-10-09 ENCOUNTER — Ambulatory Visit (HOSPITAL_COMMUNITY): Payer: Self-pay | Admitting: Pharmacist

## 2023-10-09 DIAGNOSIS — Z7901 Long term (current) use of anticoagulants: Secondary | ICD-10-CM | POA: Insufficient documentation

## 2023-10-09 DIAGNOSIS — Z4801 Encounter for change or removal of surgical wound dressing: Secondary | ICD-10-CM | POA: Insufficient documentation

## 2023-10-09 DIAGNOSIS — Z95811 Presence of heart assist device: Secondary | ICD-10-CM | POA: Insufficient documentation

## 2023-10-09 LAB — PROTIME-INR
INR: 1.7 — ABNORMAL HIGH (ref 0.8–1.2)
Prothrombin Time: 20.5 s — ABNORMAL HIGH (ref 11.4–15.2)

## 2023-10-09 NOTE — Progress Notes (Signed)
 Patient presents for dressing change in VAD Clinic today alone. Reports no problems with VAD equipment or concerns with drive line.   Pt had a shock for VF on 10/03/23. Medtronic device interrogated today.   Pt denies any issues or shocks since 10/03/23.   Exit Site Care: Drive line is being maintained once a week by VAD coordinators. Existing VAD dressing removed and site care performed using sterile technique. Drive line exit site cleaned with Chlora prep applicators x 2, allowed to dry, and Sorbaview dressing with Silverlon patch applied. Exit site healed and incorporated, the velour is exposed approx 1" at exit site. No redness, tenderness, drainage, foul odor or rash noted. Drive line anchor re-applied. Pt denies fever or chills. Pt states they have adequate dressing supplies at home.    Plan: Return to clinic in 1 week for dressing change   Adams Adams RN VAD Coordinator  Office: [phone]  24/7 Pager: [phone]

## 2023-10-12 ENCOUNTER — Other Ambulatory Visit (HOSPITAL_COMMUNITY): Payer: Self-pay

## 2023-10-16 ENCOUNTER — Ambulatory Visit (HOSPITAL_COMMUNITY)
Admission: RE | Admit: 2023-10-16 | Discharge: 2023-10-16 | Disposition: A | Discharge status: Home / Self Care | Source: Ambulatory Visit | Attending: Cardiology | Admitting: Cardiology

## 2023-10-16 DIAGNOSIS — Z95811 Presence of heart assist device: Secondary | ICD-10-CM | POA: Insufficient documentation

## 2023-10-16 DIAGNOSIS — Z4801 Encounter for change or removal of surgical wound dressing: Secondary | ICD-10-CM | POA: Diagnosis present

## 2023-10-16 NOTE — Progress Notes (Signed)
 Patient presents for dressing change in VAD Clinic today alone. Reports no problems with VAD equipment or concerns with drive line.   Pt denies any issues or shocks since 10/03/23.   Exit Site Care: Drive line is being maintained once a week by VAD coordinators. Existing VAD dressing removed and site care performed using sterile technique. Drive line exit site cleaned with Chlora prep applicators x 2, allowed to dry, and Sorbaview dressing with Silverlon patch applied. Exit site healed and incorporated, the velour is exposed approx 1" at exit site. No redness, tenderness, drainage, foul odor or rash noted. Drive line anchor re-applied. Pt denies fever or chills. Pt states they have adequate dressing supplies at home.    Plan: Return to clinic in 1 week for dressing change and INR  Paulo Bosworth RN VAD Coordinator  Office: [phone]  24/7 Pager: [phone]

## 2023-10-19 ENCOUNTER — Other Ambulatory Visit (HOSPITAL_COMMUNITY): Payer: Self-pay | Admitting: *Deleted

## 2023-10-19 DIAGNOSIS — Z95811 Presence of heart assist device: Secondary | ICD-10-CM

## 2023-10-19 DIAGNOSIS — Z7901 Long term (current) use of anticoagulants: Secondary | ICD-10-CM

## 2023-10-22 ENCOUNTER — Other Ambulatory Visit: Payer: Self-pay

## 2023-10-22 ENCOUNTER — Other Ambulatory Visit (HOSPITAL_COMMUNITY): Payer: Self-pay

## 2023-10-22 MED ORDER — TAMSULOSIN HCL 0.4 MG PO CAPS
0.4000 mg | ORAL_CAPSULE | Freq: Every day | ORAL | 1 refills | Status: DC
  Filled 2023-10-22: qty 90, 90d supply, fill #0

## 2023-10-23 ENCOUNTER — Ambulatory Visit (HOSPITAL_COMMUNITY)
Admission: RE | Admit: 2023-10-23 | Discharge: 2023-10-23 | Disposition: A | Discharge status: Home / Self Care | Source: Ambulatory Visit | Attending: Internal Medicine | Admitting: Internal Medicine

## 2023-10-23 ENCOUNTER — Ambulatory Visit (HOSPITAL_COMMUNITY): Payer: Self-pay | Admitting: Pharmacist

## 2023-10-23 DIAGNOSIS — Z4509 Encounter for adjustment and management of other cardiac device: Secondary | ICD-10-CM | POA: Insufficient documentation

## 2023-10-23 DIAGNOSIS — Z95811 Presence of heart assist device: Secondary | ICD-10-CM

## 2023-10-23 DIAGNOSIS — Z7901 Long term (current) use of anticoagulants: Secondary | ICD-10-CM

## 2023-10-23 LAB — PROTIME-INR
INR: 2 — ABNORMAL HIGH (ref 0.8–1.2)
Prothrombin Time: 22.9 s — ABNORMAL HIGH (ref 11.4–15.2)

## 2023-10-23 NOTE — Progress Notes (Signed)
 Patient presents for dressing change in VAD Clinic today alone. Reports no problems with VAD equipment or concerns with drive line.   Pt denies any issues or shocks since 10/03/23.   Exit Site Care: Drive line is being maintained once a week by VAD coordinators. Existing VAD dressing removed and site care performed using sterile technique. Drive line exit site cleaned with Chlora prep applicators x 2, allowed to dry, and Sorbaview dressing with Silverlon patch applied. Exit site healed and incorporated, the velour is exposed approx 1" at exit site. No redness, tenderness, drainage, foul odor or rash noted. Drive line anchor re-applied. Pt denies fever or chills. Pt states they have adequate dressing supplies at home.    Plan: Return to clinic in 1 week for dressing change   Adams Adams RN VAD Coordinator  Office: [phone]  24/7 Pager: [phone]

## 2023-10-23 NOTE — Addendum Note (Signed)
 Encounter addended by: Neomi Banks, RN on: 10/23/2023 4:25 PM  Actions taken: Charge Capture section accepted

## 2023-10-30 ENCOUNTER — Ambulatory Visit (HOSPITAL_COMMUNITY)
Admission: RE | Admit: 2023-10-30 | Discharge: 2023-10-30 | Disposition: A | Discharge status: Home / Self Care | Source: Ambulatory Visit | Attending: Internal Medicine | Admitting: Internal Medicine

## 2023-10-30 DIAGNOSIS — Z95811 Presence of heart assist device: Secondary | ICD-10-CM | POA: Diagnosis not present

## 2023-10-30 DIAGNOSIS — Z4801 Encounter for change or removal of surgical wound dressing: Secondary | ICD-10-CM | POA: Insufficient documentation

## 2023-10-30 NOTE — Progress Notes (Addendum)
 Patient presents for dressing change in VAD Clinic today alone. Reports no problems with VAD equipment or concerns with drive line.   Pt denies any issues or shocks since 10/03/23.   Exit Site Care: Drive line is being maintained once a week by VAD coordinators. Existing VAD dressing removed and site care performed using sterile technique. Drive line exit site cleaned with Chlora prep applicators x 2, allowed to dry, and Sorbaview dressing with Silverlon patch applied. Exit site healed and incorporated, the velour is exposed approx 1" at exit site. No redness, tenderness, drainage, foul odor or rash noted. Drive line anchor re-applied. Pt denies fever or chills. Pt provided with 4 weekly kits for home use.    Plan: Return to clinic in 1 week for dressing change and INR  Paulo Bosworth RN VAD Coordinator  Office: [phone]  24/7 Pager: [phone]

## 2023-10-30 NOTE — Addendum Note (Signed)
 Encounter addended by: Antionette Bath, RN on: 10/30/2023 11:08 AM  Actions taken: Clinical Note Signed

## 2023-11-01 ENCOUNTER — Other Ambulatory Visit (HOSPITAL_COMMUNITY): Payer: Self-pay | Admitting: *Deleted

## 2023-11-01 DIAGNOSIS — Z7901 Long term (current) use of anticoagulants: Secondary | ICD-10-CM

## 2023-11-01 DIAGNOSIS — Z95811 Presence of heart assist device: Secondary | ICD-10-CM

## 2023-11-05 ENCOUNTER — Other Ambulatory Visit (HOSPITAL_COMMUNITY): Payer: Self-pay

## 2023-11-06 ENCOUNTER — Ambulatory Visit (HOSPITAL_COMMUNITY): Payer: Self-pay | Admitting: Pharmacist

## 2023-11-06 ENCOUNTER — Ambulatory Visit (HOSPITAL_COMMUNITY)
Admission: RE | Admit: 2023-11-06 | Discharge: 2023-11-06 | Disposition: A | Discharge status: Home / Self Care | Source: Ambulatory Visit | Attending: Cardiology | Admitting: Cardiology

## 2023-11-06 ENCOUNTER — Other Ambulatory Visit (HOSPITAL_COMMUNITY): Payer: Self-pay | Admitting: Internal Medicine

## 2023-11-06 DIAGNOSIS — Z4801 Encounter for change or removal of surgical wound dressing: Secondary | ICD-10-CM | POA: Diagnosis present

## 2023-11-06 DIAGNOSIS — Z7901 Long term (current) use of anticoagulants: Secondary | ICD-10-CM | POA: Diagnosis not present

## 2023-11-06 DIAGNOSIS — Z95811 Presence of heart assist device: Secondary | ICD-10-CM | POA: Insufficient documentation

## 2023-11-06 LAB — PROTIME-INR
INR: 2 — ABNORMAL HIGH (ref 0.8–1.2)
Prothrombin Time: 22.9 s — ABNORMAL HIGH (ref 11.4–15.2)

## 2023-11-06 NOTE — Progress Notes (Signed)
 Patient presents for dressing change in VAD Clinic today alone. Reports no problems with VAD equipment or concerns with drive line.   Pt denies any issues or shocks since 10/03/23.   Exit Site Care: Drive line is being maintained once a week by VAD coordinators. Existing VAD dressing removed and site care performed using sterile technique. Drive line exit site cleaned with Chlora prep applicators x 2, allowed to dry, and Sorbaview dressing with Silverlon patch applied. Exit site healed and incorporated, the velour is exposed approx 1" at exit site. No redness, tenderness, drainage, foul odor or rash noted. Drive line anchor re-applied. Pt denies fever or chills. Pt has sufficient kits for home use.    Plan: Return to clinic in 1 week for dressing change   Adams Adams RN VAD Coordinator  Office: [phone]  24/7 Pager: [phone]

## 2023-11-08 ENCOUNTER — Other Ambulatory Visit (HOSPITAL_COMMUNITY): Payer: Self-pay

## 2023-11-08 ENCOUNTER — Other Ambulatory Visit: Payer: Self-pay

## 2023-11-08 MED ORDER — PANTOPRAZOLE SODIUM 40 MG PO TBEC
40.0000 mg | DELAYED_RELEASE_TABLET | Freq: Every day | ORAL | 6 refills | Status: DC
  Filled 2023-11-08: qty 30, 30d supply, fill #0
  Filled 2023-12-08: qty 30, 30d supply, fill #1
  Filled 2024-01-05 – 2024-01-07 (×2): qty 30, 30d supply, fill #2
  Filled 2024-02-03: qty 30, 30d supply, fill #3
  Filled 2024-03-04: qty 30, 30d supply, fill #4
  Filled 2024-04-13: qty 30, 30d supply, fill #5
  Filled 2024-05-12: qty 30, 30d supply, fill #6

## 2023-11-10 ENCOUNTER — Other Ambulatory Visit (HOSPITAL_COMMUNITY): Payer: Self-pay

## 2023-11-10 ENCOUNTER — Other Ambulatory Visit (HOSPITAL_COMMUNITY): Payer: Self-pay | Admitting: Internal Medicine

## 2023-11-12 ENCOUNTER — Other Ambulatory Visit (HOSPITAL_COMMUNITY): Payer: Self-pay

## 2023-11-12 ENCOUNTER — Other Ambulatory Visit: Payer: Self-pay

## 2023-11-12 MED ORDER — FINASTERIDE 5 MG PO TABS
5.0000 mg | ORAL_TABLET | Freq: Every day | ORAL | 6 refills | Status: DC
  Filled 2023-11-12: qty 30, 30d supply, fill #0

## 2023-11-13 ENCOUNTER — Other Ambulatory Visit (HOSPITAL_COMMUNITY): Payer: Self-pay

## 2023-11-13 ENCOUNTER — Other Ambulatory Visit: Payer: Self-pay

## 2023-11-13 ENCOUNTER — Ambulatory Visit (HOSPITAL_COMMUNITY)
Admission: RE | Admit: 2023-11-13 | Discharge: 2023-11-13 | Disposition: A | Discharge status: Home / Self Care | Source: Ambulatory Visit | Attending: Cardiology | Admitting: Cardiology

## 2023-11-13 DIAGNOSIS — Z95811 Presence of heart assist device: Secondary | ICD-10-CM | POA: Insufficient documentation

## 2023-11-13 DIAGNOSIS — Z4509 Encounter for adjustment and management of other cardiac device: Secondary | ICD-10-CM | POA: Diagnosis present

## 2023-11-13 DIAGNOSIS — I5042 Chronic combined systolic (congestive) and diastolic (congestive) heart failure: Secondary | ICD-10-CM

## 2023-11-13 MED ORDER — FINASTERIDE 5 MG PO TABS
5.0000 mg | ORAL_TABLET | Freq: Every day | ORAL | 3 refills | Status: AC
  Filled 2023-11-16: qty 90, 90d supply, fill #0
  Filled 2024-02-12: qty 90, 90d supply, fill #1
  Filled 2024-05-09: qty 90, 90d supply, fill #2

## 2023-11-13 NOTE — Progress Notes (Signed)
 Patient presents for dressing change in VAD Clinic today alone. Reports no problems with VAD equipment or concerns with drive line.   Pt denies any issues or shocks since 10/03/23.   Exit Site Care: Drive line is being maintained once a week by VAD coordinators. Existing VAD dressing removed and site care performed using sterile technique. Drive line exit site cleaned with Chlora prep applicators x 2, allowed to dry, and Sorbaview dressing with Silverlon patch applied. Exit site healed and incorporated, the velour is exposed approx 1" at exit site. No redness, tenderness, drainage, foul odor or rash noted. Drive line anchor re-applied. Pt denies fever or chills. Pt has sufficient kits for home use.    Plan: Return to clinic in 1 week for full visit with Dr Julane Ny  Paulo Bosworth RN VAD Coordinator  Office: [phone]  24/7 Pager: [phone]

## 2023-11-16 ENCOUNTER — Other Ambulatory Visit (HOSPITAL_COMMUNITY): Payer: Self-pay | Admitting: *Deleted

## 2023-11-16 ENCOUNTER — Other Ambulatory Visit (HOSPITAL_COMMUNITY): Payer: Self-pay

## 2023-11-16 ENCOUNTER — Other Ambulatory Visit: Payer: Self-pay

## 2023-11-16 DIAGNOSIS — Z7901 Long term (current) use of anticoagulants: Secondary | ICD-10-CM

## 2023-11-16 DIAGNOSIS — Z95811 Presence of heart assist device: Secondary | ICD-10-CM

## 2023-11-16 DIAGNOSIS — I5042 Chronic combined systolic (congestive) and diastolic (congestive) heart failure: Secondary | ICD-10-CM

## 2023-11-20 ENCOUNTER — Ambulatory Visit (INDEPENDENT_AMBULATORY_CARE_PROVIDER_SITE_OTHER): Payer: Medicaid Other

## 2023-11-20 ENCOUNTER — Other Ambulatory Visit (HOSPITAL_COMMUNITY): Payer: Self-pay

## 2023-11-20 ENCOUNTER — Ambulatory Visit (HOSPITAL_COMMUNITY)
Admission: RE | Admit: 2023-11-20 | Discharge: 2023-11-20 | Disposition: A | Discharge status: Home / Self Care | Source: Ambulatory Visit | Attending: Internal Medicine | Admitting: Internal Medicine

## 2023-11-20 ENCOUNTER — Encounter (HOSPITAL_COMMUNITY): Payer: Self-pay | Admitting: Internal Medicine

## 2023-11-20 ENCOUNTER — Ambulatory Visit (HOSPITAL_COMMUNITY): Payer: Self-pay | Admitting: Pharmacist

## 2023-11-20 VITALS — BP 104/0 | HR 74 | Wt 172.2 lb

## 2023-11-20 DIAGNOSIS — I480 Paroxysmal atrial fibrillation: Secondary | ICD-10-CM | POA: Diagnosis not present

## 2023-11-20 DIAGNOSIS — Z79899 Other long term (current) drug therapy: Secondary | ICD-10-CM | POA: Insufficient documentation

## 2023-11-20 DIAGNOSIS — I10 Essential (primary) hypertension: Secondary | ICD-10-CM

## 2023-11-20 DIAGNOSIS — Z95811 Presence of heart assist device: Secondary | ICD-10-CM

## 2023-11-20 DIAGNOSIS — Z4801 Encounter for change or removal of surgical wound dressing: Secondary | ICD-10-CM | POA: Diagnosis not present

## 2023-11-20 DIAGNOSIS — Z7901 Long term (current) use of anticoagulants: Secondary | ICD-10-CM | POA: Diagnosis not present

## 2023-11-20 DIAGNOSIS — I2510 Atherosclerotic heart disease of native coronary artery without angina pectoris: Secondary | ICD-10-CM

## 2023-11-20 DIAGNOSIS — I4901 Ventricular fibrillation: Secondary | ICD-10-CM

## 2023-11-20 DIAGNOSIS — I110 Hypertensive heart disease with heart failure: Secondary | ICD-10-CM | POA: Diagnosis not present

## 2023-11-20 DIAGNOSIS — I5042 Chronic combined systolic (congestive) and diastolic (congestive) heart failure: Secondary | ICD-10-CM | POA: Diagnosis not present

## 2023-11-20 DIAGNOSIS — Z4509 Encounter for adjustment and management of other cardiac device: Secondary | ICD-10-CM | POA: Insufficient documentation

## 2023-11-20 LAB — CBC
HCT: 46.1 % (ref 39.0–52.0)
Hemoglobin: 15.4 g/dL (ref 13.0–17.0)
MCH: 30.6 pg (ref 26.0–34.0)
MCHC: 33.4 g/dL (ref 30.0–36.0)
MCV: 91.5 fL (ref 80.0–100.0)
Platelets: 150 10*3/uL (ref 150–400)
RBC: 5.04 MIL/uL (ref 4.22–5.81)
RDW: 13.1 % (ref 11.5–15.5)
WBC: 6.7 10*3/uL (ref 4.0–10.5)
nRBC: 0 % (ref 0.0–0.2)

## 2023-11-20 LAB — PROTIME-INR
INR: 1.3 — ABNORMAL HIGH (ref 0.8–1.2)
Prothrombin Time: 16.7 s — ABNORMAL HIGH (ref 11.4–15.2)

## 2023-11-20 LAB — MAGNESIUM: Magnesium: 2 mg/dL (ref 1.7–2.4)

## 2023-11-20 LAB — BASIC METABOLIC PANEL WITH GFR
Anion gap: 4 — ABNORMAL LOW (ref 5–15)
BUN: 14 mg/dL (ref 8–23)
CO2: 28 mmol/L (ref 22–32)
Calcium: 8.9 mg/dL (ref 8.9–10.3)
Chloride: 108 mmol/L (ref 98–111)
Creatinine, Ser: 1.33 mg/dL — ABNORMAL HIGH (ref 0.61–1.24)
GFR, Estimated: 58 mL/min — ABNORMAL LOW (ref >60)
Glucose, Bld: 101 mg/dL — ABNORMAL HIGH (ref 70–99)
Potassium: 4 mmol/L (ref 3.5–5.1)
Sodium: 140 mmol/L (ref 135–145)

## 2023-11-20 LAB — LACTATE DEHYDROGENASE: LDH: 238 U/L — ABNORMAL HIGH (ref 98–192)

## 2023-11-20 MED ORDER — LOSARTAN POTASSIUM 50 MG PO TABS
100.0000 mg | ORAL_TABLET | Freq: Every day | ORAL | 3 refills | Status: DC
  Filled 2023-11-20: qty 90, 45d supply, fill #0

## 2023-11-20 MED ORDER — LOSARTAN POTASSIUM 50 MG PO TABS
50.0000 mg | ORAL_TABLET | Freq: Two times a day (BID) | ORAL | 3 refills | Status: DC
Start: 2023-11-20 — End: 2024-02-18
  Filled 2023-11-20 – 2023-12-08 (×2): qty 90, 45d supply, fill #0
  Filled 2024-01-19: qty 90, 45d supply, fill #1

## 2023-11-20 NOTE — Patient Instructions (Addendum)
 Increase Losartan  to 50mg  twice daily (start taking one in the morning) Dressing change next week in VAD Clinic Return in 2 months for appointment with Dr.Bensimhon

## 2023-11-20 NOTE — Progress Notes (Signed)
 LVAD CLINIC NOTE  PCP: Ziglar, Susan K, MD HF doc: DB  Chief complaint: HF  HPI:  Steven [name] is a 68 y.o. 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 Underwent HM-3 VAD placement 0n 03/08/23   Originally from [place], suffered a large out of hospital anterior wall myocardial infarction January 2012.  He presented after a three-hour delay to the hospital and was taken emergently for cath with DES to LAD. EF was 35-40% at time of MI. Unfortunately, did not have recovery of EF. C/w progressive decline in systolic function over the years. Ultimately underwent ICD.    Admitted on 02/26/23 w/ NYHA Class IV symptoms and low output. Echo showed EF < 20%, RV mildly reduced. RHC showed with low output. CI 2.1. Calcified pericardium (no H/o TB). Underwent HMIII LVAD implant on 03/08/23. Intra-op TEE LVEF 15%, RV normal.  Post op course was c/b afib w/ RVR, requiring IV amiodarone . Converted back to NSR.  Seen on 05/22/23 for unscheduled f/u due to low flow alarms on VAD. Pacer reprogrammed to let intrinsic rate come through (was RV pacing at 80). Feels much better. Walking 40 mins every [place] in the park. Denies orthopnea or PND. No fevers, chills or problems with driveline. No bleeding, melena or neuro symptoms. No VAD alarms. Taking all meds as prescribed.   Here for routine f/u. Working [employer]. Feels great. Denies orthopnea or PND. No fevers, chills or problems with driveline. No bleeding, melena or neuro symptoms. No VAD alarms. Taking all meds as prescribed.      VAD Indication: Destination Therapy - pt preference, social issues   VAD interrogation & Equipment Management: Speed: 5600 Flow: 4.3 Power: 4.3 w PI: 5.2   Alarms: none Events: 50-70    Fixed speed: 5600 Low speed limit:  5300   Primary Controller:  Replace back up battery in 27 months. Back up  controller:  Not present   Annual Equipment Maintenance on UBC/PM was performed on 03/08/23.    I reviewed the LVAD parameters from today and compared the results to the patient's prior recorded data. LVAD interrogation was NEGATIVE for significant power changes, NEGATIVE for clinical alarms and STABLE for PI events/speed drops. No programming changes were made and pump is functioning within specified parameters. Pt is performing daily controller and system monitor self tests along with completing weekly and monthly maintenance for LVAD equipment.   LVAD equipment check completed and is in good working order. Back-up equipment NOT present. Charged back up battery and performed self-test on equipment.   Past Medical History:  Diagnosis Date   Acute MI, anterior wall (HCC)    AICD (automatic cardioverter/defibrillator) present    CAD (coronary artery disease)    2D ECHO, 07/13/2011 - EF <25%, LV moderatelty dilated, LA moderately dilatedLEXISCAN, 12/14/2011 - moderate-severe perfusion defect seen in the basal anteroseptal, mid anterior, apicacl anterior, apical, apical inferior, and apical lateral regions, post-stress EF 25%, new EKG changes from baseline abnormalities   Cancer [facility])    Prostate   CHF (congestive heart failure) (HCC) 2012   Hypertension 08/08/2021   Inguinal hernia, left    Pneumonia    November 2023   Pre-diabetes     Current Outpatient Medications  Medication Sig Dispense Refill   atorvastatin  (LIPITOR ) 80 MG tablet Take 1 tablet (80 mg total) by mouth daily. 90 tablet 3  finasteride  (PROSCAR ) 5 MG tablet Take 1 tablet (5 mg total) by mouth daily. 90 tablet 3   losartan  (COZAAR ) 50 MG tablet Take 1 tablet (50 mg total) by mouth at bedtime. 90 tablet 3   Multiple Vitamins-Minerals (CERTAVITE/ANTIOXIDANTS) TABS Take 1 tablet by mouth daily. 130 tablet 0   pantoprazole  (PROTONIX ) 40 MG tablet Take 1 tablet (40 mg total) by mouth daily. 30 tablet 6   tamsulosin  (FLOMAX ) 0.4  MG CAPS capsule Take 1 capsule (0.4 mg total) by mouth daily after supper. 30 capsule 0   warfarin (COUMADIN ) 2.5 MG tablet Take 1 (one) tablet by mouth on Mon, Wed, Fri, Sat and Sunday with supper. Take 2 (two) tablets on Tue and Thur with supper. 50 tablet 3   cephALEXin  (KEFLEX ) 500 MG capsule Take 1 capsule (500 mg total) by mouth 2 (two) times daily. (Patient not taking: Reported on 11/20/2023) 6 capsule 0   docusate sodium  (COLACE) 100 MG capsule Take 2 capsules (200 mg total) by mouth daily. (Patient not taking: Reported on 11/20/2023) 100 capsule 0   torsemide  (DEMADEX ) 20 MG tablet Take 1 tablet (20 mg total) by mouth daily as needed. (Patient not taking: Reported on 11/20/2023) 30 tablet 0   traMADol  (ULTRAM ) 50 MG tablet Take 1 tablet (50 mg total) by mouth every 4 (four) hours as needed for severe pain. (Patient not taking: Reported on 11/20/2023) 35 tablet 0   traZODone  (DESYREL ) 50 MG tablet Take 1 tablet (50 mg total) by mouth at bedtime as needed for sleep. (Patient not taking: Reported on 11/20/2023) 30 tablet 0   No current facility-administered medications for this encounter.    Patient has no known allergies.  REVIEW OF SYSTEMS: All systems negative except as listed in HPI, PMH and Problem list.  Vital Signs:  Doppler Pressure: 104                      Automatic BP: 113/99 (106) HR: 74 SPO2: 99%   Weight: 172.2 lb w/ eqt Last weight: 170 lb w/o eqt  Vitals:   11/20/23 1011 11/20/23 1012  BP: (!) 113/99 (!) 104/0  Pulse: 74   SpO2: 99%   Weight: 78.1 kg (172 lb 3.2 oz)     Physical Exam: General:  NAD.  HEENT: normal  Neck: supple. JVP not elevated.  Carotids 2+ bilat; no bruits. No lymphadenopathy or thryomegaly appreciated. Cor: LVAD hum.  Lungs: Clear. Abdomen: soft, nontender, non-distended. No hepatosplenomegaly. No bruits or masses. Good bowel sounds. Driveline site clean. Anchor in place.  Extremities: no cyanosis, clubbing, rash. Warm no edema  Neuro:  alert & oriented x 3. No focal deficits. Moves all 4 without problem   ASSESSMENT AND PLAN:  1. Chronic combined systolic and diastolic heart failure - Due to ICM with anterior MI in 2012 - Echo 02/27/23: EF <20%, LV with GHK, RV mildly reduced, GIIDD, LA mod dilated, mild MR.  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 GDMT limited by renal function and hypotension.  - S/P HM3 LVAD implant 03/08/23.   - Doing well NYHA I - Volume status looks good - MAPs elevated increase losartan  50 bid - Did not tolerate Entresto  with severe orthostasis. Will not challenge with SGLT2i    2. VAD  - V.vadi - RAMP echo 3/25 speed turned down from 5700 -> 5600 - LDH 238 - Hgb 15.4 - DL ok - MAP high. Increase losartan  - INR 1.3 Goal 2.0-3.0 Discussed warfarin dosing  with PharmD personally. Will need Lovenox  3. 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  - No s/s angina - off asa as above.  - Continue statin   4. CKD stage 3: Creatinine baseline 1.6-1.8 - cardiorenal, resolved w/ LVAD support - Scr stable at 1.33   5. H/o VT s/p Medtronic ICD: likely scar mediated - No recent VT - off amio  6. Paroxysmal Atrial fibrillation:  - Remains in NSR. On warfarin  - INR management as above  I spent a total of 41 minutes today: 1) reviewing the patient's medical records including previous charts, labs and recent notes from other providers; 2) examining the patient and counseling them on their medical issues/explaining the plan of care; 3) adjusting meds as needed and 4) ordering lab work or other needed tests.    Jules Oar, MD  10:12 AM

## 2023-11-20 NOTE — Progress Notes (Signed)
 LVAD INR

## 2023-11-20 NOTE — Progress Notes (Signed)
 Patient presents for 2 month f/u follow up in VAD Clinic today alone. Reports no problems with VAD equipment or concerns with drive line.   Denies dizziness, falls, shortness of breath, heart failure symptoms, and signs of bleeding. Reports occasional lightheaded episodes that resolve quickly. He went back to work and is working 40 hrs a week. Speed reduced to 5600 last visit per ramp echo. Pt denies issues since change.  He is taking all medications as instructed. BP remains slightly elevated today. Per Dr Julane Ny will increase Losartan  to 50 mg twice daily. Updated prescription sent to pt's pharmacy. Pt verbalized understanding. Will check BP next week at dressing change appt.   Vital Signs:  Doppler Pressure: 104   Automatic BP: 113/99 (106) HR: 74 SPO2: 99%   Weight: 172.2 lb w/ eqt Last weight: 170 lb w/o eqt   VAD Indication: Destination Therapy - pt preference, social issues   VAD interrogation & Equipment Management: Speed: 5600 Flow: 4.3 Power: 4.3 w PI: 5.2   Alarms: none Events: 50-70   Fixed speed: 5600 Low speed limit:  5300   Primary Controller:  Replace back up battery in 27 months. Back up controller:  Not present  Annual Equipment Maintenance on UBC/PM was performed on 03/08/23.    I reviewed the LVAD parameters from today and compared the results to the patient's prior recorded data. LVAD interrogation was NEGATIVE for significant power changes, NEGATIVE for clinical alarms and STABLE for PI events/speed drops. No programming changes were made and pump is functioning within specified parameters. Pt is performing daily controller and system monitor self tests along with completing weekly and monthly maintenance for LVAD equipment.   LVAD equipment check completed and is in good working order. Back-up equipment NOT present. Charged back up battery and performed self-test on equipment.    Exit Site Care: Drive line is being maintained once a week by VAD  coordinators. Existing VAD dressing removed and site care performed using sterile technique. Drive line exit site cleaned with Chlora prep applicators x 2, allowed to dry, and Sorbaview dressing with Silverlon patch applied. Exit site healed and incorporated, the velour is exposed approx 1" at exit site. No redness, tenderness, drainage, foul odor or rash noted. Drive line anchor re-applied. Pt denies fever or chills. Pt states they have adequate dressing supplies at home.   Significant Events on VAD Support:   Device: Medtronic Therapies: on VF 200 BPM Last check: 08/21/23  BP & Labs:  MAP 104 - Doppler is reflecting MAP   Hgb 15.4 - No S/S of bleeding. Specifically denies melena/BRBPR or nosebleeds.   LDH stable at 238 with established baseline of 230-290. Denies tea-colored urine. No power elevations noted on interrogation.   Plan: Increase Losartan  to 50 mg twice a day.  Return to clinic in 1 week for dressing change Return to clinic in 2 months for follow up with Dr Bensimhon Coumadin  dose per Barbra Ley [facility]  Laurice Pope RN, BSN VAD Coordinator 24/7 Pager [phone]

## 2023-11-21 LAB — CUP PACEART REMOTE DEVICE CHECK
Battery Remaining Longevity: 70 mo
Battery Voltage: 2.98 V
Brady Statistic AP VP Percent: 0 %
Brady Statistic AP VS Percent: 0.09 %
Brady Statistic AS VP Percent: 0.04 %
Brady Statistic AS VS Percent: 99.87 %
Brady Statistic RA Percent Paced: 0.09 %
Brady Statistic RV Percent Paced: 0.04 %
Date Time Interrogation Session: 20250527091605
HighPow Impedance: 45 Ohm
HighPow Impedance: 65 Ohm
Implantable Lead Connection Status: 753985
Implantable Lead Connection Status: 753985
Implantable Lead Implant Date: 20120906
Implantable Lead Implant Date: 20120906
Implantable Lead Location: 753859
Implantable Lead Location: 753860
Implantable Lead Model: 185
Implantable Lead Model: 5076
Implantable Lead Serial Number: 358872
Implantable Pulse Generator Implant Date: 20200831
Lead Channel Impedance Value: 304 Ohm
Lead Channel Impedance Value: 323 Ohm
Lead Channel Impedance Value: 399 Ohm
Lead Channel Pacing Threshold Amplitude: 0.75 V
Lead Channel Pacing Threshold Amplitude: 0.75 V
Lead Channel Pacing Threshold Pulse Width: 0.4 ms
Lead Channel Pacing Threshold Pulse Width: 0.4 ms
Lead Channel Sensing Intrinsic Amplitude: 1.625 mV
Lead Channel Sensing Intrinsic Amplitude: 1.625 mV
Lead Channel Sensing Intrinsic Amplitude: 2 mV
Lead Channel Sensing Intrinsic Amplitude: 2 mV
Lead Channel Setting Pacing Amplitude: 1.5 V
Lead Channel Setting Pacing Amplitude: 2.5 V
Lead Channel Setting Pacing Pulse Width: 0.4 ms
Lead Channel Setting Sensing Sensitivity: 0.3 mV
Zone Setting Status: 755011

## 2023-11-23 ENCOUNTER — Other Ambulatory Visit (HOSPITAL_COMMUNITY): Payer: Self-pay | Admitting: *Deleted

## 2023-11-23 DIAGNOSIS — Z95811 Presence of heart assist device: Secondary | ICD-10-CM

## 2023-11-23 DIAGNOSIS — Z7901 Long term (current) use of anticoagulants: Secondary | ICD-10-CM

## 2023-11-23 DIAGNOSIS — I5042 Chronic combined systolic (congestive) and diastolic (congestive) heart failure: Secondary | ICD-10-CM

## 2023-11-27 ENCOUNTER — Ambulatory Visit (HOSPITAL_COMMUNITY)
Admission: RE | Admit: 2023-11-27 | Discharge: 2023-11-27 | Disposition: A | Discharge status: Home / Self Care | Source: Ambulatory Visit | Attending: Cardiology | Admitting: Cardiology

## 2023-11-27 ENCOUNTER — Ambulatory Visit (HOSPITAL_COMMUNITY): Payer: Self-pay | Admitting: Pharmacist

## 2023-11-27 DIAGNOSIS — Z95811 Presence of heart assist device: Secondary | ICD-10-CM | POA: Insufficient documentation

## 2023-11-27 DIAGNOSIS — I5042 Chronic combined systolic (congestive) and diastolic (congestive) heart failure: Secondary | ICD-10-CM | POA: Insufficient documentation

## 2023-11-27 DIAGNOSIS — Z7901 Long term (current) use of anticoagulants: Secondary | ICD-10-CM | POA: Insufficient documentation

## 2023-11-27 DIAGNOSIS — Z4801 Encounter for change or removal of surgical wound dressing: Secondary | ICD-10-CM | POA: Insufficient documentation

## 2023-11-27 LAB — PROTIME-INR
INR: 2.1 — ABNORMAL HIGH (ref 0.8–1.2)
Prothrombin Time: 23.6 s — ABNORMAL HIGH (ref 11.4–15.2)

## 2023-11-27 NOTE — Progress Notes (Signed)
 Patient presents for dressing change in VAD Clinic today alone. Reports no problems with VAD equipment or concerns with drive line.   Pt denies any issues or shocks since 10/03/23.   Exit Site Care: Drive line is being maintained once a week by VAD coordinators. Existing VAD dressing removed and site care performed using sterile technique. Drive line exit site cleaned with Chlora prep applicators x 2, allowed to dry, and Sorbaview dressing with Silverlon patch applied. Exit site healed and incorporated, the velour is exposed approx 1" at exit site. No redness, tenderness, drainage, foul odor or rash noted. Drive line anchor re-applied. Pt denies fever or chills. Pt given 4 weekly kits and instructed to bring to clinic each week.   Plan: Return to clinic in 1 week for dressing change  Adams Adams RN VAD Coordinator  Office: [phone]  24/7 Pager: [phone]

## 2023-11-28 ENCOUNTER — Ambulatory Visit: Payer: Self-pay | Admitting: Cardiovascular Disease

## 2023-12-04 ENCOUNTER — Ambulatory Visit (HOSPITAL_COMMUNITY)
Admission: RE | Admit: 2023-12-04 | Discharge: 2023-12-04 | Disposition: A | Discharge status: Home / Self Care | Source: Ambulatory Visit | Attending: Cardiology | Admitting: Cardiology

## 2023-12-04 DIAGNOSIS — Z4801 Encounter for change or removal of surgical wound dressing: Secondary | ICD-10-CM | POA: Diagnosis present

## 2023-12-04 DIAGNOSIS — Z95811 Presence of heart assist device: Secondary | ICD-10-CM | POA: Insufficient documentation

## 2023-12-04 NOTE — Progress Notes (Signed)
 Patient presents for dressing change in VAD Clinic today alone. Reports no problems with VAD equipment or concerns with drive line.   Pt denies any issues or shocks since 10/03/23.   Exit Site Care: Drive line is being maintained once a week by VAD coordinators. Existing VAD dressing removed and site care performed using sterile technique. Drive line exit site cleaned with Chlora prep applicators x 2, allowed to dry, and Sorbaview dressing with Silverlon patch applied. Exit site healed and incorporated, the velour is exposed approx 1" at exit site. No redness, tenderness, drainage, foul odor or rash noted. Drive line anchor re-applied. Pt denies fever or chills. Pt has adequate dressing supplies at home.   Plan:  Return to clinic in 1 week for dressing change and INR  Adams Adams RN VAD Coordinator  Office: [phone]  24/7 Pager: [phone]

## 2023-12-07 ENCOUNTER — Other Ambulatory Visit (HOSPITAL_COMMUNITY): Payer: Self-pay | Admitting: *Deleted

## 2023-12-07 DIAGNOSIS — Z7901 Long term (current) use of anticoagulants: Secondary | ICD-10-CM

## 2023-12-07 DIAGNOSIS — Z95811 Presence of heart assist device: Secondary | ICD-10-CM

## 2023-12-08 ENCOUNTER — Other Ambulatory Visit: Payer: Self-pay

## 2023-12-08 ENCOUNTER — Other Ambulatory Visit (HOSPITAL_BASED_OUTPATIENT_CLINIC_OR_DEPARTMENT_OTHER): Payer: Self-pay

## 2023-12-11 ENCOUNTER — Ambulatory Visit (HOSPITAL_COMMUNITY)
Admission: RE | Admit: 2023-12-11 | Discharge: 2023-12-11 | Disposition: A | Discharge status: Home / Self Care | Source: Ambulatory Visit | Attending: Cardiology | Admitting: Cardiology

## 2023-12-11 ENCOUNTER — Ambulatory Visit (HOSPITAL_COMMUNITY): Payer: Self-pay | Admitting: Pharmacist

## 2023-12-11 DIAGNOSIS — Z95811 Presence of heart assist device: Secondary | ICD-10-CM | POA: Diagnosis not present

## 2023-12-11 DIAGNOSIS — Z4509 Encounter for adjustment and management of other cardiac device: Secondary | ICD-10-CM | POA: Diagnosis present

## 2023-12-11 DIAGNOSIS — Z7901 Long term (current) use of anticoagulants: Secondary | ICD-10-CM | POA: Insufficient documentation

## 2023-12-11 LAB — PROTIME-INR
INR: 2 — ABNORMAL HIGH (ref 0.8–1.2)
Prothrombin Time: 22.5 s — ABNORMAL HIGH (ref 11.4–15.2)

## 2023-12-11 NOTE — Progress Notes (Signed)
 Patient presents for dressing change in VAD Clinic today alone. Reports no problems with VAD equipment or concerns with drive line.   Pt denies any issues or shocks since 10/03/23.  Pt requesting BP be checked today. BP: 101/81 (89).    Exit Site Care: Drive line is being maintained once a week by VAD coordinators. Existing VAD dressing removed and site care performed using sterile technique. Drive line exit site cleaned with Chlora prep applicators x 2, allowed to dry, and Sorbaview dressing with Silverlon patch applied. Exit site healed and incorporated, the velour is exposed approx 1 at exit site. No redness, tenderness, drainage, foul odor or rash noted. Drive line anchor re-applied. Pt denies fever or chills. Pt has adequate dressing supplies at home.   Plan:  Return to clinic in 1 week for dressing change  Paulo Bosworth RN VAD Coordinator  Office: [phone]  24/7 Pager: [phone]

## 2023-12-14 ENCOUNTER — Other Ambulatory Visit (HOSPITAL_COMMUNITY): Payer: Self-pay

## 2023-12-18 ENCOUNTER — Ambulatory Visit (HOSPITAL_COMMUNITY)
Admission: RE | Admit: 2023-12-18 | Discharge: 2023-12-18 | Disposition: A | Discharge status: Home / Self Care | Source: Ambulatory Visit | Attending: Internal Medicine | Admitting: Internal Medicine

## 2023-12-18 DIAGNOSIS — Z4509 Encounter for adjustment and management of other cardiac device: Secondary | ICD-10-CM | POA: Diagnosis present

## 2023-12-18 DIAGNOSIS — Z95811 Presence of heart assist device: Secondary | ICD-10-CM | POA: Diagnosis not present

## 2023-12-18 NOTE — Progress Notes (Signed)
 Patient presents for dressing change in VAD Clinic today alone. Reports no problems with VAD equipment or concerns with drive line.   Pt denies any issues or shocks since 10/03/23.    Exit Site Care: Drive line is being maintained once a week by VAD coordinators. Existing VAD dressing removed and site care performed using sterile technique. Drive line exit site cleaned with Chlora prep applicators x 2, allowed to dry, and Sorbaview dressing with Silverlon patch applied. Exit site healed and incorporated, the velour is exposed approx 1 at exit site. No redness, tenderness, drainage, foul odor or rash noted. Drive line anchor re-applied. Pt denies fever or chills. Pt has adequate dressing supplies at home.   Plan:  Return to clinic in 1 week for dressing change/INR  Lauraine Ip RN VAD Coordinator  Office: [phone]  24/7 Pager: [phone]

## 2023-12-18 NOTE — Addendum Note (Signed)
 Encounter addended by: Elza Lauraine NOVAK, RN on: 12/18/2023 3:46 PM  Actions taken: Charge Capture section accepted

## 2023-12-21 ENCOUNTER — Other Ambulatory Visit (HOSPITAL_COMMUNITY): Payer: Self-pay | Admitting: Unknown Physician Specialty

## 2023-12-21 DIAGNOSIS — Z95811 Presence of heart assist device: Secondary | ICD-10-CM

## 2023-12-21 DIAGNOSIS — Z7901 Long term (current) use of anticoagulants: Secondary | ICD-10-CM

## 2023-12-25 ENCOUNTER — Ambulatory Visit (HOSPITAL_COMMUNITY): Payer: Self-pay | Admitting: Pharmacist

## 2023-12-25 ENCOUNTER — Ambulatory Visit (HOSPITAL_COMMUNITY)
Admission: RE | Admit: 2023-12-25 | Discharge: 2023-12-25 | Disposition: A | Discharge status: Home / Self Care | Source: Ambulatory Visit | Attending: Cardiology | Admitting: Cardiology

## 2023-12-25 DIAGNOSIS — Z7901 Long term (current) use of anticoagulants: Secondary | ICD-10-CM | POA: Diagnosis not present

## 2023-12-25 DIAGNOSIS — Z4801 Encounter for change or removal of surgical wound dressing: Secondary | ICD-10-CM | POA: Diagnosis not present

## 2023-12-25 DIAGNOSIS — Z95811 Presence of heart assist device: Secondary | ICD-10-CM | POA: Diagnosis present

## 2023-12-25 LAB — PROTIME-INR
INR: 1.8 — ABNORMAL HIGH (ref 0.8–1.2)
Prothrombin Time: 22.2 s — ABNORMAL HIGH (ref 11.4–15.2)

## 2023-12-25 NOTE — Progress Notes (Signed)
 Patient presents for dressing change in VAD Clinic today alone. Reports no problems with VAD equipment or concerns with drive line.     Exit Site Care: Drive line is being maintained once a week by VAD coordinators. Existing VAD dressing removed and site care performed using sterile technique. Drive line exit site cleaned with Chlora prep applicators x 2, allowed to dry, and Sorbaview dressing with Silverlon patch applied. Exit site healed and incorporated, the velour is exposed approx 1 at exit site. No redness, tenderness, drainage, foul odor or rash noted. Drive line anchor re-applied. Pt denies fever or chills. Pt has adequate dressing supplies at home.   Plan:  Return to clinic in 1 week for dressing change/INR  Lauraine Ip RN VAD Coordinator  Office: [phone]  24/7 Pager: [phone]

## 2023-12-26 ENCOUNTER — Ambulatory Visit: Admitting: Family Medicine

## 2023-12-26 ENCOUNTER — Encounter: Payer: Self-pay | Admitting: Family Medicine

## 2023-12-26 ENCOUNTER — Ambulatory Visit: Payer: Self-pay

## 2023-12-26 ENCOUNTER — Other Ambulatory Visit: Payer: Self-pay

## 2023-12-26 VITALS — BP 108/72 | HR 44 | Temp 98.6°F | Resp 18 | Ht 70.0 in | Wt 171.0 lb

## 2023-12-26 DIAGNOSIS — R600 Localized edema: Secondary | ICD-10-CM

## 2023-12-26 DIAGNOSIS — R509 Fever, unspecified: Secondary | ICD-10-CM | POA: Diagnosis not present

## 2023-12-26 LAB — POC COVID19/FLU A&B COMBO
Covid Antigen, POC: NEGATIVE
Influenza A Antigen, POC: NEGATIVE
Influenza B Antigen, POC: NEGATIVE

## 2023-12-26 NOTE — Assessment & Plan Note (Signed)
 Afebrile in the office.  Negative for influenza A and B and COVID.  UA is benign.  Exam is benign other than left lower extremity edema and tenderness.

## 2023-12-26 NOTE — Assessment & Plan Note (Signed)
 Unusual for patient to have LLE.  On coumadin  for mural thrombus and VAD.  Will check for DVT despite anticoagulation.

## 2023-12-26 NOTE — Telephone Encounter (Signed)
 FYI Only or Action Required?: Action required by provider: request for appointment.  Patient was last seen in primary care on 07/25/2023 by Ziglar, Susan K, MD. Called Nurse Triage reporting Fever. Symptoms began a week ago. Interventions attempted: OTC medications: Tylenol . Symptoms are: unchanged.  Triage Disposition: See Physician Within 24 Hours  Patient/caregiver understands and will follow disposition?: YesCopied from CRM [id number]. Topic: Clinical - Red Word Triage >> Dec 26, 2023 11:32 AM Tobias L wrote: Red Word that prompted transfer to Nurse Triage: pt has had a fever for about a week, has taken tylenol  but still having fevers.   Patient also having pain in left leg Reason for Disposition  Fever present > 3 days (72 hours)  Answer Assessment - Initial Assessment Questions 1. TEMPERATURE: What is the most recent temperature?  How was it measured?      Not sure 2. ONSET: When did the fever start?      7 days ago  3. CHILLS: Do you have chills? If yes: How bad are they?  (e.g., none, mild, moderate, severe)   - NONE: no chills   - MILD: feeling cold   - MODERATE: feeling very cold, some shivering (feels better under a thick blanket)   - SEVERE: feeling extremely cold with shaking chills (general body shaking, rigors; even under a thick blanket)      None  4. OTHER SYMPTOMS: Do you have any other symptoms besides the fever?  (e.g., abdomen pain, cough, diarrhea, earache, headache, sore throat, urination pain)     denies 5. CAUSE: If there are no symptoms, ask: What do you think is causing the fever?      Not sure 6. CONTACTS: Does anyone else in the family have an infection?     Not known  7. TREATMENT: What have you done so far to treat this fever? (e.g., medications)     Tylenol   8. IMMUNOCOMPROMISE: Do you have of the following: diabetes, HIV positive, splenectomy, cancer chemotherapy, chronic steroid treatment, transplant patient, etc.     Prostate  cancer  10. TRAVEL: Have you traveled out of the country in the last month? (e.g., travel history, exposures)       Denies    Pain in left leg for a week. Started same time as fever. Denies warmness to touch. Stands at work most of time. Swollen at night.  Protocols used: [facility]

## 2023-12-26 NOTE — Progress Notes (Signed)
 Established Patient Office Visit  Subjective   Patient ID: Steven [name], male    DOB: Mar 19, 1956  Age: 68 y.o. 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  Underwent placement of HM-3 VAD on 03/08/2023.  (History of large anterior MI January 2012.  Had DES to LAD.  EF was 35 to 40% and there was no recovery of his EF.  He had a slow decline in his systolic function over the years and eventually underwent ICD.  He was admitted 9/24 with NYHA class IV symptoms.  He blacked out on three occasions but did not hit his head.  He was short of breath with ambulation and had fluid overload from CHF.  He had HM 3 LVAD implant 03/08/2023.  TEE LVEF 15%.  RV normal.  He had problems with atrial fibrillation with RVR requiring IV amiodarone  following implantation.) He feels like he has been running a fever for the last week but he has not taken his temperature.  The only thing that is bothering him is his left calf aches and that is a new thing.  His left leg is swollen.  He reports this is a new thing.  He takes torsemide  20 mg daily and usually does not have any swelling in either leg.  He denies dysuria but may have some frequency.  He denies a sore throat, cough, shortness of breath, night sweats, PND or orthopnea.  He is still working.  He is on Coumadin  two 4 days a week and one 3 days a week.      ROS    Objective:     BP 108/72 (BP Location: Right Arm, Patient Position: Sitting, Cuff Size: Normal)   Pulse (!) 44   Temp 98.6 F (37 C) (Oral)   Resp 18   Ht 5' 10 (1.778 m)   Wt 171 lb (77.6 kg)   SpO2 97%   BMI 24.54 kg/m    Physical Exam Vitals and nursing note reviewed.  Constitutional:       Appearance: Normal appearance.  HENT:     Head: Normocephalic and atraumatic.  Eyes:     Conjunctiva/sclera: Conjunctivae normal.  Cardiovascular:     Rate and Rhythm: Normal rate and regular rhythm.     Heart sounds: Murmur (Continuous hum) heard.  Pulmonary:     Effort: Pulmonary effort is normal.     Breath sounds: Normal breath sounds.  Musculoskeletal:     Right lower leg: No edema.     Left lower leg: Edema (2+ PE to the knee) present.  Skin:    General: Skin is warm and dry.  Neurological:     Mental Status: He is alert and oriented to person, place, and time.  Psychiatric:        Mood and Affect: Mood normal.        Behavior: Behavior normal.        Thought Content: Thought content normal.        Judgment: Judgment normal.          Results for orders placed or performed in visit on 12/26/23  POC Covid19/Flu A&B Antigen  Result Value Ref Range   Influenza A Antigen, POC Negative Negative  Influenza B Antigen, POC Negative Negative   Covid Antigen, POC Negative Negative      The ASCVD Risk score (Arnett DK, et al., 2019) failed to calculate for the following reasons:   Risk score cannot be calculated because patient has a medical history suggesting prior/existing ASCVD    Assessment & Plan:  Leg edema, left Assessment & Plan: Unusual for patient to have LLE.  On coumadin  for mural thrombus and VAD.  Will check for DVT despite anticoagulation.    Orders: -     LOWER EXTREMITY VENOUS-LEFT (BACK OFFICE); Future -     VAS US  LOWER EXTREMITY VENOUS (DVT); Future  Fever, unspecified fever cause Assessment & Plan: Afebrile in the office.  Negative for influenza A and B and COVID.  UA is benign.  Exam is benign other than left lower extremity edema and tenderness.  Orders: -     CBC with Differential/Platelet -     Comprehensive metabolic panel with GFR -     TSH + free T4 -     POC Covid19/Flu A&B Antigen     Return if symptoms worsen or fail to  improve.    Nylan Nakatani K Tore Carreker, MD

## 2023-12-27 ENCOUNTER — Ambulatory Visit: Payer: Self-pay | Admitting: Family Medicine

## 2023-12-27 ENCOUNTER — Other Ambulatory Visit (HOSPITAL_COMMUNITY): Payer: Self-pay | Admitting: Unknown Physician Specialty

## 2023-12-27 ENCOUNTER — Ambulatory Visit (HOSPITAL_COMMUNITY)
Admission: RE | Admit: 2023-12-27 | Discharge: 2023-12-27 | Disposition: A | Discharge status: Home / Self Care | Source: Ambulatory Visit | Attending: Family Medicine | Admitting: Family Medicine

## 2023-12-27 DIAGNOSIS — Z95811 Presence of heart assist device: Secondary | ICD-10-CM

## 2023-12-27 DIAGNOSIS — R600 Localized edema: Secondary | ICD-10-CM

## 2023-12-27 DIAGNOSIS — Z7901 Long term (current) use of anticoagulants: Secondary | ICD-10-CM

## 2023-12-27 LAB — CBC WITH DIFFERENTIAL/PLATELET
Basophils Absolute: 0 10*3/uL (ref 0.0–0.2)
Basos: 1 %
EOS (ABSOLUTE): 0.3 10*3/uL (ref 0.0–0.4)
Eos: 3 %
Hematocrit: 40.1 % (ref 37.5–51.0)
Hemoglobin: 12.8 g/dL — ABNORMAL LOW (ref 13.0–17.7)
Immature Grans (Abs): 0 10*3/uL (ref 0.0–0.1)
Immature Granulocytes: 0 %
Lymphocytes Absolute: 1.5 10*3/uL (ref 0.7–3.1)
Lymphs: 18 %
MCH: 30.3 pg (ref 26.6–33.0)
MCHC: 31.9 g/dL (ref 31.5–35.7)
MCV: 95 fL (ref 79–97)
Monocytes Absolute: 0.9 10*3/uL (ref 0.1–0.9)
Monocytes: 10 %
Neutrophils Absolute: 5.7 10*3/uL (ref 1.4–7.0)
Neutrophils: 68 %
Platelets: 214 10*3/uL (ref 150–450)
RBC: 4.23 x10E6/uL (ref 4.14–5.80)
RDW: 12.2 % (ref 11.6–15.4)
WBC: 8.4 10*3/uL (ref 3.4–10.8)

## 2023-12-27 LAB — COMPREHENSIVE METABOLIC PANEL WITH GFR
ALT: 16 IU/L (ref 0–44)
AST: 29 IU/L (ref 0–40)
Albumin: 4 g/dL (ref 3.9–4.9)
Alkaline Phosphatase: 93 IU/L (ref 44–121)
BUN/Creatinine Ratio: 10 (ref 10–24)
BUN: 11 mg/dL (ref 8–27)
Bilirubin Total: 0.6 mg/dL (ref 0.0–1.2)
CO2: 21 mmol/L (ref 20–29)
Calcium: 9.2 mg/dL (ref 8.6–10.2)
Chloride: 104 mmol/L (ref 96–106)
Creatinine, Ser: 1.05 mg/dL (ref 0.76–1.27)
Globulin, Total: 3.3 g/dL (ref 1.5–4.5)
Glucose: 95 mg/dL (ref 70–99)
Potassium: 4.4 mmol/L (ref 3.5–5.2)
Sodium: 139 mmol/L (ref 134–144)
Total Protein: 7.3 g/dL (ref 6.0–8.5)
eGFR: 77 mL/min/{1.73_m2} (ref >59)

## 2023-12-27 LAB — TSH+FREE T4
Free T4: 1.55 ng/dL (ref 0.82–1.77)
TSH: 1.48 u[IU]/mL (ref 0.450–4.500)

## 2023-12-31 ENCOUNTER — Other Ambulatory Visit: Payer: Self-pay

## 2023-12-31 ENCOUNTER — Other Ambulatory Visit (HOSPITAL_COMMUNITY): Payer: Self-pay

## 2023-12-31 ENCOUNTER — Ambulatory Visit (HOSPITAL_COMMUNITY)
Admission: RE | Admit: 2023-12-31 | Discharge: 2023-12-31 | Disposition: A | Discharge status: Home / Self Care | Source: Ambulatory Visit | Attending: Cardiology | Admitting: Cardiology

## 2023-12-31 ENCOUNTER — Ambulatory Visit (HOSPITAL_COMMUNITY): Payer: Self-pay | Admitting: Pharmacist

## 2023-12-31 DIAGNOSIS — Z4801 Encounter for change or removal of surgical wound dressing: Secondary | ICD-10-CM | POA: Diagnosis present

## 2023-12-31 DIAGNOSIS — Z7901 Long term (current) use of anticoagulants: Secondary | ICD-10-CM | POA: Insufficient documentation

## 2023-12-31 DIAGNOSIS — Z95811 Presence of heart assist device: Secondary | ICD-10-CM | POA: Diagnosis not present

## 2023-12-31 LAB — PROTIME-INR
INR: 1.9 — ABNORMAL HIGH (ref 0.8–1.2)
Prothrombin Time: 23.1 s — ABNORMAL HIGH (ref 11.4–15.2)

## 2023-12-31 MED ORDER — TRAZODONE HCL 50 MG PO TABS
50.0000 mg | ORAL_TABLET | Freq: Every evening | ORAL | 3 refills | Status: AC | PRN
  Filled 2023-12-31: qty 30, 30d supply, fill #0

## 2023-12-31 NOTE — Progress Notes (Addendum)
 Patient presents for dressing change in VAD Clinic today alone. Reports no problems with VAD equipment or concerns with drive line.     Pt states he was having lower left calf pain that he sought out care for last week at his PCP. A lower extremity doppler was ordered.  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  - There is no evidence of deep vein thrombosis in the lower extremity.   INR collected today and Lauren [facility] made aware. Pt states he is no longer having pain in his left calf but does notice swelling in both feet after work where he stands for long periods of time. Discussed with Dr. Bensimhon. MD advises if pain continues to apply warm compresses and wear compression stockings at work.  Exit Site Care: Drive line is being maintained once a week by VAD coordinators. Existing VAD dressing removed and site care performed using sterile technique. Drive line exit site cleaned with Chlora prep applicators x 2, allowed to dry, and Sorbaview dressing with Silverlon patch applied. Exit site healed and incorporated, the velour is exposed approx 1 at exit site. No redness, tenderness, drainage, foul odor or rash noted. Drive line anchor re-applied. Pt denies fever or chills. Pt has adequate dressing supplies at home.    Plan:  Return to clinic in 1 week for dressing change  Schuyler Lunger RN, BSN VAD Coordinator 24/7 Pager [phone]

## 2023-12-31 NOTE — Addendum Note (Signed)
 Encounter addended by: Gladis Schuyler BROCKS, RN on: 12/31/2023 2:59 PM  Actions taken: Pend clinical note, Clinical Note Signed

## 2024-01-04 NOTE — Progress Notes (Signed)
 Remote ICD transmission.

## 2024-01-04 NOTE — Addendum Note (Signed)
 Addended by: TAWNI DRILLING D on: 01/04/2024 02:07 PM   Modules accepted: Orders

## 2024-01-05 ENCOUNTER — Other Ambulatory Visit (HOSPITAL_COMMUNITY): Payer: Self-pay

## 2024-01-07 ENCOUNTER — Other Ambulatory Visit (HOSPITAL_COMMUNITY): Payer: Self-pay

## 2024-01-08 ENCOUNTER — Ambulatory Visit (HOSPITAL_COMMUNITY)
Admission: RE | Admit: 2024-01-08 | Discharge: 2024-01-08 | Disposition: A | Discharge status: Home / Self Care | Source: Ambulatory Visit | Attending: Cardiology | Admitting: Cardiology

## 2024-01-08 DIAGNOSIS — I82812 Embolism and thrombosis of superficial veins of left lower extremities: Secondary | ICD-10-CM | POA: Insufficient documentation

## 2024-01-08 DIAGNOSIS — Z4801 Encounter for change or removal of surgical wound dressing: Secondary | ICD-10-CM | POA: Diagnosis present

## 2024-01-08 DIAGNOSIS — Z95811 Presence of heart assist device: Secondary | ICD-10-CM | POA: Diagnosis not present

## 2024-01-08 NOTE — Progress Notes (Signed)
 Patient presents for dressing change in VAD Clinic today alone. Reports no problems with VAD equipment or concerns with drive line.     Pt states he was having lower left calf pain that he sought out care for at his PCP. A lower extremity doppler was completed 12/27/23.  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  - There is no evidence of deep vein thrombosis in the lower extremity.   Pt states he is having intermittent pain in his left calf. At times he notes swelling in his lower leg. No swelling present today. Per Dr Bensimhon- pt to wear compression stockings at work, and to apply warm compresses for the pain. Advised pt of the same today. He verbalized understanding.   Exit Site Care: Drive line is being maintained once a week by VAD coordinators. Existing VAD dressing removed and site care performed using sterile technique. Drive line exit site cleaned with Chlora prep applicators x 2, allowed to dry, and Sorbaview dressing with Silverlon patch applied. Exit site healed and incorporated, the velour is exposed approx 1 at exit site. No redness, tenderness, drainage, foul odor or rash noted. Drive line anchor re-applied. Pt denies fever or chills. Pt has adequate dressing supplies at home.    Plan:  Return to clinic in 1 week for full visit  Isaiah Knoll RN VAD Coordinator  Office: [phone]  24/7 Pager: [phone]

## 2024-01-11 ENCOUNTER — Other Ambulatory Visit (HOSPITAL_COMMUNITY): Payer: Self-pay

## 2024-01-11 ENCOUNTER — Other Ambulatory Visit: Payer: Self-pay

## 2024-01-11 DIAGNOSIS — Z95811 Presence of heart assist device: Secondary | ICD-10-CM

## 2024-01-11 DIAGNOSIS — Z7901 Long term (current) use of anticoagulants: Secondary | ICD-10-CM

## 2024-01-14 ENCOUNTER — Other Ambulatory Visit (HOSPITAL_COMMUNITY): Payer: Self-pay | Admitting: *Deleted

## 2024-01-14 DIAGNOSIS — Z95811 Presence of heart assist device: Secondary | ICD-10-CM

## 2024-01-14 DIAGNOSIS — I5042 Chronic combined systolic (congestive) and diastolic (congestive) heart failure: Secondary | ICD-10-CM

## 2024-01-14 DIAGNOSIS — Z7901 Long term (current) use of anticoagulants: Secondary | ICD-10-CM

## 2024-01-15 ENCOUNTER — Ambulatory Visit (HOSPITAL_COMMUNITY): Payer: Self-pay | Admitting: Pharmacist

## 2024-01-15 ENCOUNTER — Other Ambulatory Visit (HOSPITAL_COMMUNITY): Payer: Self-pay | Admitting: *Deleted

## 2024-01-15 ENCOUNTER — Other Ambulatory Visit: Payer: Self-pay

## 2024-01-15 ENCOUNTER — Other Ambulatory Visit (HOSPITAL_COMMUNITY): Payer: Self-pay

## 2024-01-15 ENCOUNTER — Ambulatory Visit (HOSPITAL_COMMUNITY)
Admission: RE | Admit: 2024-01-15 | Discharge: 2024-01-15 | Disposition: A | Discharge status: Home / Self Care | Source: Ambulatory Visit | Attending: Internal Medicine | Admitting: Internal Medicine

## 2024-01-15 VITALS — BP 107/89 | HR 82 | Wt 168.2 lb

## 2024-01-15 DIAGNOSIS — Z7901 Long term (current) use of anticoagulants: Secondary | ICD-10-CM

## 2024-01-15 DIAGNOSIS — I480 Paroxysmal atrial fibrillation: Secondary | ICD-10-CM | POA: Diagnosis not present

## 2024-01-15 DIAGNOSIS — I513 Intracardiac thrombosis, not elsewhere classified: Secondary | ICD-10-CM | POA: Diagnosis not present

## 2024-01-15 DIAGNOSIS — I472 Ventricular tachycardia, unspecified: Secondary | ICD-10-CM | POA: Insufficient documentation

## 2024-01-15 DIAGNOSIS — I2510 Atherosclerotic heart disease of native coronary artery without angina pectoris: Secondary | ICD-10-CM | POA: Insufficient documentation

## 2024-01-15 DIAGNOSIS — I5042 Chronic combined systolic (congestive) and diastolic (congestive) heart failure: Secondary | ICD-10-CM | POA: Insufficient documentation

## 2024-01-15 DIAGNOSIS — E785 Hyperlipidemia, unspecified: Secondary | ICD-10-CM | POA: Insufficient documentation

## 2024-01-15 DIAGNOSIS — I255 Ischemic cardiomyopathy: Secondary | ICD-10-CM | POA: Insufficient documentation

## 2024-01-15 DIAGNOSIS — I252 Old myocardial infarction: Secondary | ICD-10-CM | POA: Diagnosis not present

## 2024-01-15 DIAGNOSIS — I5022 Chronic systolic (congestive) heart failure: Secondary | ICD-10-CM

## 2024-01-15 DIAGNOSIS — Z95810 Presence of automatic (implantable) cardiac defibrillator: Secondary | ICD-10-CM | POA: Insufficient documentation

## 2024-01-15 DIAGNOSIS — N1831 Chronic kidney disease, stage 3a: Secondary | ICD-10-CM | POA: Diagnosis not present

## 2024-01-15 DIAGNOSIS — Z955 Presence of coronary angioplasty implant and graft: Secondary | ICD-10-CM | POA: Insufficient documentation

## 2024-01-15 DIAGNOSIS — Z4509 Encounter for adjustment and management of other cardiac device: Secondary | ICD-10-CM | POA: Insufficient documentation

## 2024-01-15 DIAGNOSIS — Z95811 Presence of heart assist device: Secondary | ICD-10-CM | POA: Diagnosis not present

## 2024-01-15 LAB — CBC
HCT: 33.5 % — ABNORMAL LOW (ref 39.0–52.0)
Hemoglobin: 11.3 g/dL — ABNORMAL LOW (ref 13.0–17.0)
MCH: 30.5 pg (ref 26.0–34.0)
MCHC: 33.7 g/dL (ref 30.0–36.0)
MCV: 90.3 fL (ref 80.0–100.0)
Platelets: 233 K/uL (ref 150–400)
RBC: 3.71 MIL/uL — ABNORMAL LOW (ref 4.22–5.81)
RDW: 13.8 % (ref 11.5–15.5)
WBC: 11 K/uL — ABNORMAL HIGH (ref 4.0–10.5)
nRBC: 0 % (ref 0.0–0.2)

## 2024-01-15 LAB — BASIC METABOLIC PANEL WITH GFR
Anion gap: 11 (ref 5–15)
BUN: 13 mg/dL (ref 8–23)
CO2: 20 mmol/L — ABNORMAL LOW (ref 22–32)
Calcium: 8.7 mg/dL — ABNORMAL LOW (ref 8.9–10.3)
Chloride: 103 mmol/L (ref 98–111)
Creatinine, Ser: 1.23 mg/dL (ref 0.61–1.24)
GFR, Estimated: >60 mL/min (ref >60)
Glucose, Bld: 114 mg/dL — ABNORMAL HIGH (ref 70–99)
Potassium: 3.7 mmol/L (ref 3.5–5.1)
Sodium: 134 mmol/L — ABNORMAL LOW (ref 135–145)

## 2024-01-15 LAB — LACTATE DEHYDROGENASE: LDH: 214 U/L — ABNORMAL HIGH (ref 98–192)

## 2024-01-15 LAB — PROTIME-INR
INR: 2.3 — ABNORMAL HIGH (ref 0.8–1.2)
Prothrombin Time: 26.8 s — ABNORMAL HIGH (ref 11.4–15.2)

## 2024-01-15 MED ORDER — WARFARIN SODIUM 2.5 MG PO TABS
ORAL_TABLET | ORAL | 6 refills | Status: DC
  Filled 2024-01-15: qty 60, 39d supply, fill #0

## 2024-01-15 NOTE — Patient Instructions (Signed)
 No medication changes Coumadin  dosing per Lauren PharmD Return to clinic in 2 months for full visit. This will be your 1 year visit. Please bring your battery charger, MPU, and black bag for annual maintenance. We will also completed your Intermacs at this visit. Please wear tennis shoes for your 6 minute walk.

## 2024-01-15 NOTE — Progress Notes (Addendum)
 Patient presents for 2 month f/u follow up in VAD Clinic today alone. Reports no problems with VAD equipment or concerns with drive line.   Denies dizziness, falls, shortness of breath, heart failure symptoms, and signs of bleeding. Reports occasional lightheaded episodes when bending over that resolve quickly. He is working 40 hrs a week. 80+ PI events daily noted on VAD interrogation. Pt states he thinks he is drinking 2L daily. Advised he must ensure adequate hydration especially since it is so hot and he is working outside. He verbalized understanding.   Left calf pain improved with wearing compression stockings. Pt states he plans to get a heating pad this weak for any breakthrough discomfort.   He is taking all medications as instructed. BP improved with increasing Losartan  last visit. He has not taken his medication yet today.   Vital Signs:  Doppler Pressure: 92   Automatic BP: 107/89 (96) HR: 82 SPO2: 99%   Weight: 168.2 lb w/ eqt Last weight: 172.2 lb w/ eqt   VAD Indication: Destination Therapy - pt preference, social issues   VAD interrogation & Equipment Management: Speed: 5600 Flow: 4.7 Power: 4.3 w PI: 3.0   Alarms: none Events: 80+  Fixed speed: 5600 Low speed limit:  5300   Primary Controller:  Replace back up battery in 23 months. Back up controller:  Not present  Annual Equipment Maintenance on UBC/PM was performed on 03/08/23.    I reviewed the LVAD parameters from today and compared the results to the patient's prior recorded data. LVAD interrogation was NEGATIVE for significant power changes, NEGATIVE for clinical alarms and STABLE for PI events/speed drops. No programming changes were made and pump is functioning within specified parameters. Pt is performing daily controller and system monitor self tests along with completing weekly and monthly maintenance for LVAD equipment.   LVAD equipment check completed and is in good working order. Back-up equipment  NOT present. Charged back up battery and performed self-test on equipment.    Exit Site Care: Drive line is being maintained once a week by VAD coordinators. Existing VAD dressing removed and site care performed using sterile technique. Drive line exit site cleaned with Chlora prep applicators x 2, allowed to dry, and Sorbaview dressing with Silverlon patch applied. Exit site healed and incorporated, the velour is exposed approx 1 at exit site. No redness, tenderness, drainage, foul odor or rash noted. Drive line anchor re-applied. Pt denies fever or chills. Pt states they have adequate dressing supplies at home.   Significant Events on VAD Support:   Device: Medtronic Therapies: on VF 200 BPM Last check: 11/20/23  BP & Labs:  MAP 92 - Doppler is reflecting MAP   Hgb 11.3 - No S/S of bleeding. Specifically denies melena/BRBPR or nosebleeds.   LDH stable at 214 with established baseline of 230-290. Denies tea-colored urine. No power elevations noted on interrogation.   Plan: No medication changes Coumadin  dosing per Lauren PharmD Return to clinic in 2 months for full visit. This will be your 1 year visit. Please bring your battery charger, MPU, and black bag for annual maintenance. We will also completed your Intermacs at this visit. Please wear tennis shoes for your 6 minute walk.  Isaiah Knoll RN VAD Coordinator  Office: [phone]  24/7 Pager: [phone]

## 2024-01-16 NOTE — Progress Notes (Signed)
 LVAD CLINIC NOTE  PCP: Ziglar, Susan K, MD HF doc: DB  Chief complaint: HF  HPI:  Steven [name] is a 68 y.o. 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 Underwent HM-3 VAD placement 0n 03/08/23   Originally from [place], suffered a large out of hospital anterior wall myocardial infarction January 2012.  He presented after a three-hour delay to the hospital and was taken emergently for cath with DES to LAD. EF was 35-40% at time of MI. Unfortunately, did not have recovery of EF. C/w progressive decline in systolic function over the years. Ultimately underwent ICD.    Admitted on 02/26/23 w/ NYHA Class IV symptoms and low output. Echo showed EF < 20%, RV mildly reduced. RHC showed with low output. CI 2.1. Calcified pericardium (no H/o TB). Underwent HMIII LVAD implant on 03/08/23. Intra-op TEE LVEF 15%, RV normal.  Post op course was c/b afib w/ RVR, requiring IV amiodarone . Converted back to NSR.  Seen on 05/22/23 for unscheduled f/u due to low flow alarms on VAD. Pacer reprogrammed to let intrinsic rate come through (was RV pacing at 80). Feels much better. Walking 40 mins every day in the park. Denies orthopnea or PND. No fevers, chills or problems with driveline. No bleeding, melena or neuro symptoms. No VAD alarms. Taking all meds as prescribed.   Routine follow up today, overall doing well. Notes some dizziness when bending over. His left lower extremity swelling and discomfort is much improved with compression stocking, recent US  with small, chronic DVT. Discussed heating pads for symptoms as well. No changes in medications, HF symptoms, melena, chest pain, driveline site pain.      VAD Indication: Destination Therapy - pt preference, social issues   VAD interrogation & Equipment Management: Speed: 5600 Flow: 4.7 Power: 4.3 w PI: 3.0   Alarms: none Events:  80+ PI    Fixed speed: 5600 Low speed limit:  5300   Primary Controller:  Replace back up battery in 23 months. Back up controller:  Not present   Annual Equipment Maintenance on UBC/PM was performed on 03/08/23.    I reviewed the LVAD parameters from today, and compared the results to the patient's prior recorded data.  No programming changes were made.  The LVAD is functioning within specified parameters.  The patient performs LVAD self-test daily.  LVAD interrogation was negative for any significant power changes, alarms or PI events/speed drops.  LVAD equipment check completed and is in good working order.  Back-up equipment present.   LVAD education done on emergency procedures and precautions and reviewed exit site care.    LVAD equipment check completed and is in good working order. Back-up equipment NOT present. Charged back up battery and performed self-test on equipment.   Past Medical History:  Diagnosis Date   Acute MI, anterior wall (HCC)    AICD (automatic cardioverter/defibrillator) present    CAD (coronary artery disease)    2D ECHO, 07/13/2011 - EF <25%, LV moderatelty dilated, LA moderately dilatedLEXISCAN, 12/14/2011 - moderate-severe perfusion defect seen in the basal anteroseptal, mid anterior, apicacl anterior, apical, apical inferior, and apical lateral regions, post-stress EF 25%, new EKG changes from baseline abnormalities   Cancer [facility])    Prostate   CHF (congestive heart failure) (HCC) 2012   Hypertension 08/08/2021   Inguinal hernia, left    Pneumonia    November 2023  Pre-diabetes     Current Outpatient Medications  Medication Sig Dispense Refill   atorvastatin  (LIPITOR ) 80 MG tablet Take 1 tablet (80 mg total) by mouth daily. 90 tablet 3   finasteride  (PROSCAR ) 5 MG tablet Take 1 tablet (5 mg total) by mouth daily. 90 tablet 3   losartan  (COZAAR ) 50 MG tablet Take 1 tablet (50 mg total) by mouth 2 (two) times daily. 90 tablet 3   Multiple  Vitamins-Minerals (CERTAVITE/ANTIOXIDANTS) TABS Take 1 tablet by mouth daily. 130 tablet 0   pantoprazole  (PROTONIX ) 40 MG tablet Take 1 tablet (40 mg total) by mouth daily. 30 tablet 6   tamsulosin  (FLOMAX ) 0.4 MG CAPS capsule Take 1 capsule (0.4 mg total) by mouth daily after supper. 30 capsule 0   traZODone  (DESYREL ) 50 MG tablet Take 1 tablet (50 mg total) by mouth at bedtime as needed for sleep. 30 tablet 3   warfarin (COUMADIN ) 2.5 MG tablet Take 2.5 mg (1 tablet) every Mon, Fri; 5 mg (2 tablets) all other days; or as directed by heart failure clinic 60 tablet 6   docusate sodium  (COLACE) 100 MG capsule Take 2 capsules (200 mg total) by mouth daily. (Patient not taking: Reported on 01/15/2024) 100 capsule 0   torsemide  (DEMADEX ) 20 MG tablet Take 1 tablet (20 mg total) by mouth daily as needed. (Patient not taking: Reported on 01/15/2024) 30 tablet 0   No current facility-administered medications for this encounter.    Patient has no known allergies.  REVIEW OF SYSTEMS: All systems negative except as listed in HPI, PMH and Problem list.  Vital Signs:  Doppler Pressure: 92                      Automatic BP: 107/89 (96) HR: 82 SPO2: 99%   Weight: 168.2 lb w/ eqt Last weight: 172.2 lb w/o eqt  Vitals:   01/15/24 1005 01/15/24 1010  BP: (!) 92/0 107/89  Pulse: 82   SpO2: 99%   Weight: 76.3 kg (168 lb 3.2 oz)     Physical Exam: GENERAL: NAD, well appearing PULM:  Normal work of breathing, CTAB CARDIAC:  JVP: flat        LVAD hum, no edema ABDOMEN: Soft, non-tender, non-distended. Driveline site c/d/I, dressing and anchor in place NEUROLOGIC: Patient is oriented x3 with no focal or lateralizing neurologic deficits.    ASSESSMENT AND PLAN:  1. Chronic combined systolic and diastolic heart failure - Due to ICM with anterior MI in 2012 - Echo 02/27/23: EF <20%, LV with GHK, RV mildly reduced, GIIDD, LA mod dilated, mild MR.  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  GDMT limited by renal function and hypotension.  - S/P HM3 LVAD implant 03/08/23.   - Stable NYHA Class I, euvolemic - Had not yet taking BP meds, recent increase in losartan  to 50mg  BID, continue for now, may need increase - Did not tolerate ARNI, hesistant for SGLT2 given volume issues   2. VAD  - V.vadi - RAMP echo 3/25 speed turned down from 5700 -> 5600 - LDH 214 - Hgb 11.3, mildly decreased from prior but no bleeding episodes, recheck at next visit - DL ok - Continue losartan  as above - INR 2.3 Goal 2.0-3.0 Discussed warfarin dosing with pharmacy  3. 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  - No s/s angina - off asa as above.  - Continue statin   4. CKD stage 3: Creatinine  baseline 1.6-1.8 - cardiorenal, resolved w/ LVAD support - Scr stable at 1.23   5. H/o VT s/p Medtronic ICD: likely scar mediated - No recent VT - off amio  6. Paroxysmal Atrial fibrillation:  - Remains in NSR. On warfarin  - INR management as above  I spent a total of 48 minutes today: 1) reviewing the patient's medical records including previous charts, labs and recent notes from other providers; 2) examining the patient and counseling them on their medical issues/explaining the plan of care; 3) adjusting meds as needed and 4) ordering lab work or other needed tests.    Morene JINNY Brownie, MD  6:28 AM

## 2024-01-19 ENCOUNTER — Other Ambulatory Visit (HOSPITAL_COMMUNITY): Payer: Self-pay

## 2024-01-22 ENCOUNTER — Other Ambulatory Visit (HOSPITAL_COMMUNITY): Payer: Self-pay

## 2024-01-22 ENCOUNTER — Ambulatory Visit (HOSPITAL_COMMUNITY)
Admission: RE | Admit: 2024-01-22 | Discharge: 2024-01-22 | Disposition: A | Discharge status: Home / Self Care | Source: Ambulatory Visit | Attending: Cardiology | Admitting: Cardiology

## 2024-01-22 DIAGNOSIS — Z95811 Presence of heart assist device: Secondary | ICD-10-CM | POA: Insufficient documentation

## 2024-01-22 DIAGNOSIS — Z4509 Encounter for adjustment and management of other cardiac device: Secondary | ICD-10-CM | POA: Insufficient documentation

## 2024-01-22 NOTE — Progress Notes (Signed)
 Patient presents for dressing change in VAD Clinic today alone. Reports no problems with VAD equipment or concerns with drive line.     Exit Site Care: Drive line is being maintained once a week by VAD coordinators. Existing VAD dressing removed and site care performed using sterile technique. Drive line exit site cleaned with Chlora prep applicators x 2, allowed to dry, and Sorbaview dressing with Silverlon patch applied. Exit site healed and incorporated, the velour is exposed approx 1 at exit site. No redness, tenderness, drainage, foul odor or rash noted. Drive line anchor re-applied. Pt denies fever or chills. Pt has adequate dressing supplies at home.    Plan:  Return to clinic in 1 week for dressing change/INR, CBC  Lauraine Ip RN VAD Coordinator  Office: [phone]  24/7 Pager: [phone]

## 2024-01-24 ENCOUNTER — Ambulatory Visit

## 2024-01-24 DIAGNOSIS — Z0000 Encounter for general adult medical examination without abnormal findings: Secondary | ICD-10-CM | POA: Diagnosis not present

## 2024-01-24 NOTE — Progress Notes (Signed)
 Subjective:   Steven [name] is a 68 y.o. male who presents for Medicare Annual/Subsequent preventive examination.  Visit Complete: Virtual I connected with  Steven [name] on 01/24/24 by a audio enabled telemedicine application and verified that I am speaking with the correct person using two identifiers.  Patient Location: Home  Provider Location: Office/Clinic  I discussed the limitations of evaluation and management by telemedicine. The patient expressed understanding and agreed to proceed.  Vital Signs: Because this visit was a virtual/telehealth visit, some criteria may be missing or patient reported. Any vitals not documented were not able to be obtained and vitals that have been documented are patient reported.  Patient Medicare AWV questionnaire was completed by the patient on 01/24/2024; I have confirmed that all information answered by patient is correct and no changes since this date.        Objective:    There were no vitals filed for this visit. There is no height or weight on file to calculate BMI.     01/24/2024   10:51 AM 03/20/2023    4:32 PM 02/26/2023    2:28 AM 10/15/2022   11:30 AM 05/15/2022    3:32 PM 05/07/2022    7:32 AM 11/03/2021    3:47 PM  Advanced Directives  Does Patient Have a Medical Advance Directive? No Yes No No No No No  Type of Furniture conservator/restorer;Living will       Does patient want to make changes to medical advance directive?  No - Patient declined       Copy of Healthcare Power of Attorney in Chart?  No - copy requested       Would patient like information on creating a medical advance directive? Yes (MAU/Ambulatory/Procedural Areas - Information given) No - Patient declined No - Patient declined No - Patient declined No - Patient declined No - Patient declined     Current Medications (verified) Outpatient Encounter Medications as of 01/24/2024  Medication Sig   atorvastatin  (LIPITOR ) 80 MG tablet Take 1 tablet  (80 mg total) by mouth daily.   docusate sodium  (COLACE) 100 MG capsule Take 2 capsules (200 mg total) by mouth daily.   finasteride  (PROSCAR ) 5 MG tablet Take 1 tablet (5 mg total) by mouth daily.   losartan  (COZAAR ) 50 MG tablet Take 1 tablet (50 mg total) by mouth 2 (two) times daily.   Multiple Vitamins-Minerals (CERTAVITE/ANTIOXIDANTS) TABS Take 1 tablet by mouth daily.   pantoprazole  (PROTONIX ) 40 MG tablet Take 1 tablet (40 mg total) by mouth daily.   tamsulosin  (FLOMAX ) 0.4 MG CAPS capsule Take 1 capsule (0.4 mg total) by mouth daily after supper.   torsemide  (DEMADEX ) 20 MG tablet Take 1 tablet (20 mg total) by mouth daily as needed.   traZODone  (DESYREL ) 50 MG tablet Take 1 tablet (50 mg total) by mouth at bedtime as needed for sleep.   warfarin (COUMADIN ) 2.5 MG tablet Take 2.5 mg (1 tablet) every Mon, Fri; 5 mg (2 tablets) all other days; or as directed by heart failure clinic   No facility-administered encounter medications on file as of 01/24/2024.    Allergies (verified) Patient has no known allergies.   History: Past Medical History:  Diagnosis Date   Acute MI, anterior wall (HCC)    AICD (automatic cardioverter/defibrillator) present    CAD (coronary artery disease)    2D ECHO, 07/13/2011 - EF <25%, LV moderatelty dilated, LA moderately dilatedLEXISCAN, 12/14/2011 - moderate-severe perfusion defect  seen in the basal anteroseptal, mid anterior, apicacl anterior, apical, apical inferior, and apical lateral regions, post-stress EF 25%, new EKG changes from baseline abnormalities   Cancer [facility])    Prostate   CHF (congestive heart failure) (HCC) 2012   Hypertension 08/08/2021   Inguinal hernia, left    Pneumonia    November 2023   Pre-diabetes    Past Surgical History:  Procedure Laterality Date   CARDIAC CATHETERIZATION  11/25/2010   Predilation balloon-Apex monorail 2x58mm, Cutting balloon-2.25x104mm, resulting in a reduction of 100% stenosis down to less than 10%    CARDIAC CATHETERIZATION  07/12/2010   LAD stented with a 3.5x69mm bare-metal Veriflex stent resulting in a reduction of 100% lesion to 0%   CARDIAC DEFIBRILLATOR PLACEMENT  03/02/2011   Medtronic Protecta XT DR, model #I685IMH, serial #[id number]   COLONOSCOPY     GOLD SEED IMPLANT N/A 07/21/2021   Procedure: GOLD SEED IMPLANT;  Surgeon: Renda Glance, MD;  Location: [facility] ORS;  Service: Urology;  Laterality: N/A;  NEEDS 30 MIN   HERNIA REPAIR  2018   ICD GENERATOR CHANGEOUT N/A 02/24/2019   Procedure: ICD GENERATOR CHANGEOUT;  Surgeon: Francyne Headland, MD;  Location: [facility] INVASIVE CV [facility];  Service: Cardiovascular;  Laterality: N/A;   INSERTION OF IMPLANTABLE LEFT VENTRICULAR ASSIST DEVICE N/A 03/08/2023   Procedure: INSERTION OF IMPLANTABLE LEFT VENTRICULAR ASSIST DEVICE;  Surgeon: Lucas Dorise POUR, MD;  Location: [facility] OR;  Service: Open Heart Surgery;  Laterality: N/A;   PACEMAKER PLACEMENT  07/14/2010   Temporary placement of pacemaker, if rhythm issue continues will need a permanent device   RIGHT HEART CATH N/A 02/28/2023   Procedure: RIGHT HEART CATH;  Surgeon: Darron Deatrice LABOR, MD;  Location: [facility];  Service: Cardiovascular;  Laterality: N/A;   RIGHT HEART CATH N/A 03/06/2023   Procedure: RIGHT HEART CATH;  Surgeon: Gardenia Led, DO;  Location: [facility];  Service: Cardiovascular;  Laterality: N/A;   SPACE OAR INSTILLATION N/A 07/21/2021   Procedure: SPACE OAR INSTILLATION;  Surgeon: Renda Glance, MD;  Location: [facility] OR[facility];  Service: Urology;  Laterality: N/A;   TEE WITHOUT CARDIOVERSION N/A 03/08/2023   Procedure: TRANSESOPHAGEAL ECHOCARDIOGRAM;  Surgeon: Lucas Dorise POUR, MD;  Location: [facility] OR;  Service: Open Heart Surgery;  Laterality: N/A;   History reviewed. No pertinent family history. Social History   Socioeconomic History   Marital status: Single    Spouse name: Not on file   Number of children: 1   Years of education: Not on file   Highest education level:  Bachelor's degree (e.g., BA, AB, BS)  Occupational History   Occupation: works on Systems analyst  Tobacco Use   Smoking status: Former    Current packs/day: 0.00    Types: Cigarettes, Cigars    Quit date: 2012    Years since quitting: 13.5    Passive exposure: Past   Smokeless tobacco: Never  Vaping Use   Vaping status: Never Used  Substance and Sexual Activity   Alcohol use: Yes    Alcohol/week: 2.0 - 3.0 standard drinks of alcohol    Types: 2 - 3 drink(s) per week   Drug use: No   Sexual activity: Not Currently  Other Topics Concern   Not on file  Social History Narrative   Not on file   Social Drivers of Health   Financial Resource Strain: Medium Risk (06/26/2023)   Overall Financial Resource Strain (CARDIA)    Difficulty of Paying Living Expenses: Somewhat hard  Food Insecurity: No Food Insecurity (07/25/2023)   Hunger Vital Sign    Worried About Running Out of Food in the Last Year: Never true    Ran Out of Food in the Last Year: Never true  Transportation Needs: No Transportation Needs (07/25/2023)   PRAPARE - Administrator, Civil Service (Medical): No    Lack of Transportation (Non-Medical): No  Physical Activity: Not on file  Stress: Not on file  Social Connections: Unknown (11/08/2021)   Received from [facility]   Social Network    Social Network: Not on file    Tobacco Counseling Counseling given: Not Answered   Clinical Intake:  Pre-visit preparation completed: Yes  Pain : No/denies pain     Diabetes: No            Activities of Daily Living    01/24/2024   10:44 AM 03/20/2023    5:11 PM  In your present state of health, do you have any difficulty performing the following activities:  Hearing? 0   Vision? 0   Difficulty concentrating or making decisions? 0   Walking or climbing stairs? 0   Dressing or bathing? 0   Doing errands, shopping? 0 0  Preparing Food and eating ? N   Using the Toilet? N   In the past six months,  have you accidently leaked urine? N   Do you have problems with loss of bowel control? N   Managing your Medications? N   Managing your Finances? N   Housekeeping or managing your Housekeeping? N     Patient Care Team: Ziglar, Susan K, MD as PCP - General (Family Medicine) Burnard Debby LABOR, MD (Inactive) as PCP - Cardiology (Cardiology) Croitoru, Jerel, MD as PCP - Electrophysiology (Cardiology)  Indicate any recent Medical Services you may have received from other than [facility] providers in the past year (date may be approximate).     Assessment:   This is a routine wellness examination for Steven [name].  Hearing/Vision screen Hearing Screening - Comments:: Passed Whisper Test    Goals Addressed             This Visit's Progress    CCM Expected Outcome:  Monitor, Self-Manage and Reduce Symptoms of:       Colonoscopy      Prevent falls         Depression Screen    01/24/2024   10:50 AM 07/25/2023    1:55 PM  PHQ 2/9 Scores  PHQ - 2 Score 0 1  PHQ- 9 Score  1    Fall Risk    01/24/2024   10:44 AM 07/25/2023    1:55 PM  Fall Risk   Falls in the past year? 0 1  Number falls in past yr: 0 1  Injury with Fall? 0 0  Risk for fall due to :  History of fall(s)  Follow up  Falls prevention discussed;Education provided;Falls evaluation completed    MEDICARE RISK AT HOME: Medicare Risk at Home Any stairs in or around the home?: Yes If so, are there any without handrails?: Yes Home free of loose throw rugs in walkways, pet beds, electrical cords, etc?: Yes Adequate lighting in your home to reduce risk of falls?: Yes Life alert?: No Use of a cane, walker or w/c?: No Grab bars in the bathroom?: No Shower chair or bench in shower?: No Elevated toilet seat or a handicapped toilet?: No  TIMED UP AND GO:  Was the test performed?  No    Cognitive Function:    01/24/2024   10:50 AM  MMSE - Mini Mental State Exam  Orientation to time 5  Orientation to Place 5  Registration 3   Attention/ Calculation 5  Recall 3  Language- name 2 objects 2  Language- repeat 1  Language- follow 3 step command 3  Language- read & follow direction 1  Write a sentence 0  Copy design 0  Total score 28        01/24/2024   10:51 AM  6CIT Screen  What Year? 0 points  What month? 0 points  What time? 0 points  Count back from 20 0 points  Months in reverse 0 points  Repeat phrase 0 points  Total Score 0 points    Immunizations Immunization History  Administered Date(s) Administered   Influenza-Unspecified 06/03/2023    TDAP status: Up to date  Flu Vaccine status: Up to date  Pneumococcal vaccine status: Up to date  Covid-19 vaccine status: Information provided on how to obtain vaccines.   Qualifies for Shingles Vaccine? Yes   Zostavax completed No   Shingrix Completed?: No.    Education has been provided regarding the importance of this vaccine. Patient has been advised to call insurance company to determine out of pocket expense if they have not yet received this vaccine. Advised may also receive vaccine at local pharmacy or Health Dept. Verbalized acceptance and understanding.  Screening Tests Health Maintenance  Topic Date Due   COVID-19 Vaccine (1) Never done   Zoster Vaccines- Shingrix (1 of 2) Never done   Colonoscopy  Never done   DTaP/Tdap/Td (1 - Tdap) 07/24/2024 (Originally 02/03/1975)   Pneumococcal Vaccine: 50+ Years (1 of 2 - PCV) 07/24/2024 (Originally 02/03/1975)   INFLUENZA VACCINE  01/25/2024   Medicare Annual Wellness (AWV)  01/23/2025   Hepatitis C Screening  Completed   Hepatitis B Vaccines  Aged Out   HPV VACCINES  Aged Out   Meningococcal B Vaccine  Aged Out    Health Maintenance  Health Maintenance Due  Topic Date Due   COVID-19 Vaccine (1) Never done   Zoster Vaccines- Shingrix (1 of 2) Never done   Colonoscopy  Never done    Colorectal cancer screening: Referral to GI placed 01/24/2024. Pt aware the office will call re:  appt.  Lung Cancer Screening: (Low Dose CT Chest recommended if Age 39-80 years, 20 pack-year currently smoking OR have quit w/in 15years.) does qualify.   Lung Cancer Screening Referral: Discuss at next office visit with provider.   Additional Screening:  Hepatitis C Screening: does qualify; Completed 03/02/2023  Vision Screening: Recommended annual ophthalmology exams for early detection of glaucoma and other disorders of the eye. Is the patient up to date with their annual eye exam?  Yes  Who is the provider or what is the name of the office in which the patient attends annual eye exams? Dr. Abigail Morita If pt is not established with a provider, would they like to be referred to a provider to establish care? No .   Dental Screening: Recommended annual dental exams for proper oral hygiene  Community Resource Referral / Chronic Care Management: CRR required this visit?  No   CCM required this visit?  No     Plan:     I have personally reviewed and noted the following in the patient's chart:   Medical and social history Use of alcohol, tobacco or illicit drugs  Current medications and supplements  including opioid prescriptions. Patient is not currently taking opioid prescriptions. Functional ability and status Nutritional status Physical activity Advanced directives List of other physicians Hospitalizations, surgeries, and ER visits in previous 12 months Vitals Screenings to include cognitive, depression, and falls Referrals and appointments  In addition, I have reviewed and discussed with patient certain preventive protocols, quality metrics, and best practice recommendations. A written personalized care plan for preventive services as well as general preventive health recommendations were provided to patient.     Warren DELENA Chiles, CMA   01/24/2024   After Visit Summary: (Mail) Due to this being a telephonic visit, the after visit summary with patients personalized  plan was offered to patient via mail   Nurse Notes: Thank you for choosing [facility] at [facility].

## 2024-01-24 NOTE — Patient Instructions (Signed)
 Colonoscopy, Adult A colonoscopy is a procedure to look at the entire large intestine. This procedure is done using a long, thin, flexible tube that has a camera on the end. You may have a colonoscopy: As a part of normal colorectal screening. If you have certain symptoms, such as: A low number of red blood cells in your blood (anemia). Diarrhea that does not go away. Pain in your abdomen. Blood in your stool. A colonoscopy can help screen for and diagnose medical problems, including: An abnormal growth of cells or tissue (tumor). Abnormal growths within the lining of your intestine (polyps). Inflammation. Areas of bleeding. Tell your health care provider about: Any allergies you have. All medicines you are taking, including vitamins, herbs, eye drops, creams, and over-the-counter medicines. Any problems you or family members have had with anesthetic medicines. Any bleeding problems you have. Any surgeries you have had. Any medical conditions you have. Any problems you have had with having bowel movements. Whether you are pregnant or may be pregnant. What are the risks? Generally, this is a safe procedure. However, problems may occur, including: Bleeding. Damage to your intestine. Allergic reactions to medicines given during the procedure. Infection. This is rare. What happens before the procedure? Eating and drinking restrictions Follow instructions from your health care provider about eating or drinking restrictions, which may include: A few days before the procedure: Follow a low-fiber diet. Avoid nuts, seeds, dried fruit, raw fruits, and vegetables. 1-3 days before the procedure: Eat only gelatin dessert or ice pops. Drink only clear liquids, such as water, clear juice, clear broth or bouillon, black coffee or tea, or clear soft drinks or sports drinks. Avoid liquids that contain red or purple dye. The day of the procedure: Do not eat solid foods. You may continue to drink  clear liquids until up to 2 hours before the procedure. Do not eat or drink anything starting 2 hours before the procedure, or within the time period that your health care provider recommends. Bowel prep If you were prescribed a bowel prep to take by mouth (orally) to clean out your colon: Take it as told by your health care provider. Starting the day before your procedure, you will need to drink a large amount of liquid medicine. The liquid will cause you to have many bowel movements of loose stool until your stool becomes almost clear or light green. If your skin or the opening between the buttocks (anus) gets irritated from diarrhea, you may relieve the irritation using: Wipes with medicine in them, such as adult wet wipes with aloe and vitamin E. A product to soothe skin, such as petroleum jelly. If you vomit while drinking the bowel prep: Take a break for up to 60 minutes. Begin the bowel prep again. Call your health care provider if you keep vomiting or you cannot take the bowel prep without vomiting. To clean out your colon, you may also be given: Laxative medicines. These help you have a bowel movement. Instructions for enema use. An enema is liquid medicine injected into your rectum. Medicines Ask your health care provider about: Changing or stopping your regular medicines or supplements. This is especially important if you are taking iron supplements, diabetes medicines, or blood thinners. Taking medicines such as aspirin  and ibuprofen. These medicines can thin your blood. Do not take these medicines unless your health care provider tells you to take them. Taking over-the-counter medicines, vitamins, herbs, and supplements. General instructions Ask your health care provider what steps will be  taken to help prevent infection. These may include washing skin with a germ-killing soap. If you will be going home right after the procedure, plan to have a responsible adult: Take you home  from the hospital or clinic. You will not be allowed to drive. Care for you for the time you are told. What happens during the procedure?  An IV will be inserted into one of your veins. You will be given a medicine to make you fall asleep (general anesthetic). You will lie on your side with your knees bent. A lubricant will be put on the tube. Then the tube will be: Inserted into your anus. Gently eased through all parts of your large intestine. Air will be sent into your colon to keep it open. This may cause some pressure or cramping. Images will be taken with the camera and will appear on a screen. A small tissue sample may be removed to be looked at under a microscope (biopsy). The tissue may be sent to a lab for testing if any signs of problems are found. If small polyps are found, they may be removed and checked for cancer cells. When the procedure is finished, the tube will be removed. The procedure may vary among health care providers and hospitals. What happens after the procedure? Your blood pressure, heart rate, breathing rate, and blood oxygen level will be monitored until you leave the hospital or clinic. You may have a small amount of blood in your stool. You may pass gas and have mild cramping or bloating in your abdomen. This is caused by the air that was used to open your colon during the exam. If you were given a sedative during the procedure, it can affect you for several hours. Do not drive or operate machinery until your health care provider says that it is safe. It is up to you to get the results of your procedure. Ask your health care provider, or the department that is doing the procedure, when your results will be ready. Summary A colonoscopy is a procedure to look at the entire large intestine. Follow instructions from your health care provider about eating and drinking before the procedure. If you were prescribed an oral bowel prep to clean out your colon, take it  as told by your health care provider. During the colonoscopy, a flexible tube with a camera on its end is inserted into the anus and then passed into all parts of the large intestine. This information is not intended to replace advice given to you by your health care provider. Make sure you discuss any questions you have with your health care provider. Document Revised: 07/25/2022 Document Reviewed: 02/02/2021 Elsevier Patient Education  2024 [employer] Health Maintenance, Male Adopting a healthy lifestyle and getting preventive care are important in promoting health and wellness. Ask your health care provider about: The right schedule for you to have regular tests and exams. Things you can do on your own to prevent diseases and keep yourself healthy. What should I know about diet, weight, and exercise? Eat a healthy diet  Eat a diet that includes plenty of vegetables, fruits, low-fat dairy products, and lean protein. Do not eat a lot of foods that are high in solid fats, added sugars, or sodium. Maintain a healthy weight Body mass index (BMI) is a measurement that can be used to identify possible weight problems. It estimates body fat based on height and weight. Your health care provider can help determine your BMI and help  you achieve or maintain a healthy weight. Get regular exercise Get regular exercise. This is one of the most important things you can do for your health. Most adults should: Exercise for at least 150 minutes each week. The exercise should increase your heart rate and make you sweat (moderate-intensity exercise). Do strengthening exercises at least twice a week. This is in addition to the moderate-intensity exercise. Spend less time sitting. Even light physical activity can be beneficial. Watch cholesterol and blood lipids Have your blood tested for lipids and cholesterol at 69 years of age, then have this test every 5 years. You may need to have your cholesterol levels  checked more often if: Your lipid or cholesterol levels are high. You are older than 69 years of age. You are at high risk for heart disease. What should I know about cancer screening? Many types of cancers can be detected early and may often be prevented. Depending on your health history and family history, you may need to have cancer screening at various ages. This may include screening for: Colorectal cancer. Prostate cancer. Skin cancer. Lung cancer. What should I know about heart disease, diabetes, and high blood pressure? Blood pressure and heart disease High blood pressure causes heart disease and increases the risk of stroke. This is more likely to develop in people who have high blood pressure readings or are overweight. Talk with your health care provider about your target blood pressure readings. Have your blood pressure checked: Every 3-5 years if you are 58-14 years of age. Every year if you are 25 years old or older. If you are between the ages of 21 and 22 and are a current or former smoker, ask your health care provider if you should have a one-time screening for abdominal aortic aneurysm (AAA). Diabetes Have regular diabetes screenings. This checks your fasting blood sugar level. Have the screening done: Once every three years after age 62 if you are at a normal weight and have a low risk for diabetes. More often and at a younger age if you are overweight or have a high risk for diabetes. What should I know about preventing infection? Hepatitis B If you have a higher risk for hepatitis B, you should be screened for this virus. Talk with your health care provider to find out if you are at risk for hepatitis B infection. Hepatitis C Blood testing is recommended for: Everyone born from [birth year] through 1966. Anyone with known risk factors for hepatitis C. Sexually transmitted infections (STIs) You should be screened each year for STIs, including gonorrhea and chlamydia,  if: You are sexually active and are younger than 70 years of age. You are older than 69 years of age and your health care provider tells you that you are at risk for this type of infection. Your sexual activity has changed since you were last screened, and you are at increased risk for chlamydia or gonorrhea. Ask your health care provider if you are at risk. Ask your health care provider about whether you are at high risk for HIV. Your health care provider may recommend a prescription medicine to help prevent HIV infection. If you choose to take medicine to prevent HIV, you should first get tested for HIV. You should then be tested every 3 months for as long as you are taking the medicine. Follow these instructions at home: Alcohol use Do not drink alcohol if your health care provider tells you not to drink. If you drink alcohol: Limit how much  you have to 0-2 drinks a day. Know how much alcohol is in your drink. In the [place] one drink equals one 12 oz bottle of beer (355 mL), one 5 oz glass of wine (148 mL), or one 1 oz glass of hard liquor (44 mL). Lifestyle Do not use any products that contain nicotine or tobacco. These products include cigarettes, chewing tobacco, and vaping devices, such as e-cigarettes. If you need help quitting, ask your health care provider. Do not use street drugs. Do not share needles. Ask your health care provider for help if you need support or information about quitting drugs. General instructions Schedule regular health, dental, and eye exams. Stay current with your vaccines. Tell your health care provider if: You often feel depressed. You have ever been abused or do not feel safe at home. Summary Adopting a healthy lifestyle and getting preventive care are important in promoting health and wellness. Follow your health care provider's instructions about healthy diet, exercising, and getting tested or screened for diseases. Follow your health care provider's  instructions on monitoring your cholesterol and blood pressure. This information is not intended to replace advice given to you by your health care provider. Make sure you discuss any questions you have with your health care provider. Document Revised: 11/01/2020 Document Reviewed: 11/01/2020 Elsevier Patient Education  2024 [employer].

## 2024-01-25 ENCOUNTER — Other Ambulatory Visit: Payer: Self-pay | Admitting: Urology

## 2024-01-25 ENCOUNTER — Other Ambulatory Visit (HOSPITAL_COMMUNITY): Payer: Self-pay

## 2024-01-25 DIAGNOSIS — Z7901 Long term (current) use of anticoagulants: Secondary | ICD-10-CM

## 2024-01-25 DIAGNOSIS — Z95811 Presence of heart assist device: Secondary | ICD-10-CM

## 2024-01-28 ENCOUNTER — Other Ambulatory Visit (HOSPITAL_COMMUNITY): Payer: Self-pay

## 2024-01-28 ENCOUNTER — Other Ambulatory Visit (HOSPITAL_BASED_OUTPATIENT_CLINIC_OR_DEPARTMENT_OTHER): Payer: Self-pay

## 2024-01-28 MED ORDER — TAMSULOSIN HCL 0.4 MG PO CAPS
0.4000 mg | ORAL_CAPSULE | Freq: Every day | ORAL | 0 refills | Status: DC
  Filled 2024-01-28: qty 90, 90d supply, fill #0

## 2024-01-29 ENCOUNTER — Ambulatory Visit (HOSPITAL_COMMUNITY): Payer: Self-pay | Admitting: Pharmacist

## 2024-01-29 ENCOUNTER — Ambulatory Visit (HOSPITAL_COMMUNITY)
Admission: RE | Admit: 2024-01-29 | Discharge: 2024-01-29 | Disposition: A | Discharge status: Home / Self Care | Source: Ambulatory Visit | Attending: Cardiology

## 2024-01-29 DIAGNOSIS — Z7901 Long term (current) use of anticoagulants: Secondary | ICD-10-CM | POA: Insufficient documentation

## 2024-01-29 DIAGNOSIS — Z4801 Encounter for change or removal of surgical wound dressing: Secondary | ICD-10-CM | POA: Insufficient documentation

## 2024-01-29 DIAGNOSIS — I5022 Chronic systolic (congestive) heart failure: Secondary | ICD-10-CM | POA: Diagnosis not present

## 2024-01-29 DIAGNOSIS — Z95811 Presence of heart assist device: Secondary | ICD-10-CM | POA: Diagnosis not present

## 2024-01-29 LAB — CBC
HCT: 30.8 % — ABNORMAL LOW (ref 39.0–52.0)
Hemoglobin: 10.1 g/dL — ABNORMAL LOW (ref 13.0–17.0)
MCH: 30 pg (ref 26.0–34.0)
MCHC: 32.8 g/dL (ref 30.0–36.0)
MCV: 91.4 fL (ref 80.0–100.0)
Platelets: 251 K/uL (ref 150–400)
RBC: 3.37 MIL/uL — ABNORMAL LOW (ref 4.22–5.81)
RDW: 14.2 % (ref 11.5–15.5)
WBC: 10.7 K/uL — ABNORMAL HIGH (ref 4.0–10.5)
nRBC: 0 % (ref 0.0–0.2)

## 2024-01-29 LAB — PROTIME-INR
INR: 2.8 — ABNORMAL HIGH (ref 0.8–1.2)
Prothrombin Time: 30.8 s — ABNORMAL HIGH (ref 11.4–15.2)

## 2024-01-29 NOTE — Progress Notes (Signed)
 Patient presents for dressing change in [facility] today alone. Reports no problems with VAD equipment or concerns with drive line.     Exit Site Care: Drive line is being maintained once a week by VAD coordinators. Existing VAD dressing removed and site care performed using sterile technique. Drive line exit site cleaned with Chlora prep applicators x 2, allowed to dry, and Sorbaview dressing with Silverlon patch applied. Exit site healed and incorporated, the velour is exposed approx 1 at exit site. No redness, tenderness, drainage, foul odor or rash noted. Drive line anchor re-applied. Pt denies fever or chills. Pt given 4 weekly kits and a pack of tegaderms and asked to bring to each clinic appointment for drive line care.   Plan:  Return to clinic in 1 week for dressing change  Schuyler Lunger RN, BSN VAD Coordinator 24/7 Pager [phone]

## 2024-01-31 ENCOUNTER — Other Ambulatory Visit (HOSPITAL_BASED_OUTPATIENT_CLINIC_OR_DEPARTMENT_OTHER): Payer: Self-pay

## 2024-02-03 ENCOUNTER — Encounter (HOSPITAL_COMMUNITY): Payer: Self-pay | Admitting: *Deleted

## 2024-02-03 NOTE — Progress Notes (Signed)
 Received page from pt at 0645 reporting he had an asymptomatic LOW FLOW alarm while laying on his left side in bed. Alarm resolved after position change, and drinking 2 bottles of water. He has not taken his medicine yet today. Denies missing doses of medicines.   Current VAD numbers: Speed 5600 Flow: 4.9 Power: 3.9 PI: 2.1. Encouraged adequate hydration, and to take his morning medicines. Advised to notify VAD coordinator for further low flows, ir if he is feeling unwell. He verbalized understanding to all the above.   Isaiah Knoll RN, BSN VAD Coordinator  Office: [phone]  Pager: [phone]

## 2024-02-04 ENCOUNTER — Other Ambulatory Visit (HOSPITAL_COMMUNITY): Payer: Self-pay

## 2024-02-05 ENCOUNTER — Ambulatory Visit (HOSPITAL_COMMUNITY)
Admission: RE | Admit: 2024-02-05 | Discharge: 2024-02-05 | Disposition: A | Discharge status: Home / Self Care | Source: Ambulatory Visit | Attending: Cardiology | Admitting: Cardiology

## 2024-02-05 DIAGNOSIS — Z95811 Presence of heart assist device: Secondary | ICD-10-CM | POA: Diagnosis not present

## 2024-02-05 DIAGNOSIS — Z4509 Encounter for adjustment and management of other cardiac device: Secondary | ICD-10-CM | POA: Diagnosis present

## 2024-02-05 NOTE — Addendum Note (Signed)
 Encounter addended by: Berdine Isaiah NOVAK, RN on: 02/05/2024 10:56 AM  Actions taken: Clinical Note Signed

## 2024-02-05 NOTE — Progress Notes (Addendum)
 Patient presents for dressing change in [facility] Clinic today alone. Reports no problems with VAD equipment or concerns with drive line.    Reports he was seen by his PCP last week and was told his RBCs were low. Upon review RBCs and Hgb trending down. Hgb 11.3 3 weeks ago > 10.1 1 week ago. Pt denies blood in urine and stool. States he had a minor nose bleed last week. Will obtain CBC and anemia panel with INR draw next week. Instructed to notify VAD coordinators if he feels poorly or notes blood in stool/urine. He verbalized understanding.   No further LOW FLOWs noted on interrogation since pt paged VAD coordinator 02/03/24. (Total of 3 low flows that morning). Advised to notify VAD coordinators if he has reoccurring low flow alarm. He verbalized understanding.   VAD #s: Speed: 5600 Flow: 4.6 Power: 4.2 w PI: 3.5   Exit Site Care: Drive line is being maintained once a week by VAD coordinators. Existing VAD dressing removed and site care performed using sterile technique. Drive line exit site cleaned with Chlora prep applicators x 2, allowed to dry, and Sorbaview dressing with Silverlon patch applied. Exit site healed and incorporated, the velour is exposed approx 1 at exit site. No redness, tenderness, drainage, foul odor or rash noted. Drive line anchor re-applied. Pt denies fever or chills. Pt has adequate dressing supplies at home.   Plan:  Return to clinic in 1 week for dressing change, INR, anemia panel, and CBC.  Isaiah Knoll RN VAD Coordinator  Office: [phone]  24/7 Pager: [phone]

## 2024-02-08 ENCOUNTER — Other Ambulatory Visit (HOSPITAL_COMMUNITY): Payer: Self-pay | Admitting: Unknown Physician Specialty

## 2024-02-08 DIAGNOSIS — Z7901 Long term (current) use of anticoagulants: Secondary | ICD-10-CM

## 2024-02-08 DIAGNOSIS — Z95811 Presence of heart assist device: Secondary | ICD-10-CM

## 2024-02-12 ENCOUNTER — Inpatient Hospital Stay (HOSPITAL_COMMUNITY)
Admission: RE | Admit: 2024-02-12 | Discharge: 2024-02-12 | Discharge status: Home / Self Care | Source: Ambulatory Visit | Attending: Cardiology

## 2024-02-12 ENCOUNTER — Other Ambulatory Visit (HOSPITAL_COMMUNITY): Payer: Self-pay

## 2024-02-12 ENCOUNTER — Ambulatory Visit (HOSPITAL_COMMUNITY): Payer: Self-pay | Admitting: Pharmacist

## 2024-02-12 ENCOUNTER — Encounter (HOSPITAL_COMMUNITY): Payer: Self-pay

## 2024-02-12 DIAGNOSIS — Z95811 Presence of heart assist device: Secondary | ICD-10-CM | POA: Diagnosis not present

## 2024-02-12 DIAGNOSIS — Z7901 Long term (current) use of anticoagulants: Secondary | ICD-10-CM | POA: Insufficient documentation

## 2024-02-12 DIAGNOSIS — Z4801 Encounter for change or removal of surgical wound dressing: Secondary | ICD-10-CM | POA: Insufficient documentation

## 2024-02-12 LAB — FERRITIN: Ferritin: 476 ng/mL — ABNORMAL HIGH (ref 24–336)

## 2024-02-12 LAB — CBC
HCT: 26.7 % — ABNORMAL LOW (ref 39.0–52.0)
Hemoglobin: 8.4 g/dL — ABNORMAL LOW (ref 13.0–17.0)
MCH: 30.3 pg (ref 26.0–34.0)
MCHC: 31.5 g/dL (ref 30.0–36.0)
MCV: 96.4 fL (ref 80.0–100.0)
Platelets: 310 K/uL (ref 150–400)
RBC: 2.77 MIL/uL — ABNORMAL LOW (ref 4.22–5.81)
RDW: 18.6 % — ABNORMAL HIGH (ref 11.5–15.5)
WBC: 15.6 K/uL — ABNORMAL HIGH (ref 4.0–10.5)
nRBC: 0 % (ref 0.0–0.2)

## 2024-02-12 LAB — VITAMIN B12: Vitamin B-12: 2055 pg/mL — ABNORMAL HIGH (ref 180–914)

## 2024-02-12 LAB — IRON AND TIBC
Iron: 41 ug/dL — ABNORMAL LOW (ref 45–182)
Saturation Ratios: 19 % (ref 17.9–39.5)
TIBC: 221 ug/dL — ABNORMAL LOW (ref 250–450)
UIBC: 180 ug/dL

## 2024-02-12 LAB — PROTIME-INR
INR: 3.9 — ABNORMAL HIGH (ref 0.8–1.2)
Prothrombin Time: 40.2 s — ABNORMAL HIGH (ref 11.4–15.2)

## 2024-02-12 LAB — FOLATE: Folate: 26 ng/mL (ref >5.9)

## 2024-02-12 NOTE — Progress Notes (Addendum)
 Patient presents for dressing change in [facility] Clinic today alone. Reports no problems with VAD equipment or concerns with drive line.    Pt states he feels like his body is telling him to slow down. He is working 6 days a week most weeks. Reports occasional lightheadedness if he stands up too fast. This resolves with rest. Denies dizziness, shortness of breath, chest pain, and signs of bleeding. States he is trying to drink 2L daily. Reported last week he was seen by his PCP recently and was told his RBCs were low. Upon review RBCs and Hgb trending down. Hgb 11.3 3 weeks ago > 10.1 2 weeks ago. Pt denies blood in urine and stool. Will obtain CBC and anemia panel with INR today. Instructed to notify VAD coordinators if he feels poorly or notes blood in stool/urine. He verbalized understanding.   No further LOW FLOWs noted on interrogation since pt paged VAD coordinator 02/03/24. (Total of 3 low flows that morning). Advised to notify VAD coordinators if he has reoccurring low flow alarm. He verbalized understanding.   Exit Site Care: Drive line is being maintained once a week by VAD coordinators. Existing VAD dressing removed and site care performed using sterile technique. Drive line exit site cleaned with Chlora prep applicators x 2, allowed to dry, and Sorbaview dressing with Silverlon patch applied. Exit site healed and incorporated, the velour is exposed approx 1 at exit site. No redness, tenderness, drainage, foul odor or rash noted. Drive line anchor re-applied. Covered entire dressing with 2 large tegaderms. Pt denies fever or chills. Pt has adequate dressing supplies at home.   Plan:  Return to clinic in 1 week for dressing change Coumadin  dosing per Lauren PharmD  Addendum: Hgb 8.4 (down from 10.1). WBC 15.6. INR 3.9. Discussed with Dr Cherrie. Will plan to direct admit pt to [facility] tomorrow. Will consult GI team on admission for eval for probable scope. Rounding team aware of plan. Admission  called in. 2C charge RN aware of plan.   Called pt and made aware of the above plan. Instructed to HOLD Coumadin  tonight. Discussed he may be in the hospital at least a week for treatment so he can let his job know. Advised to page VAD coordinator immediately if becomes symptomatic or develops blood in urine/stool. Made aware that either pt placement or VAD coordinator will call him tomorrow when his bed his ready. He verbalized understanding to all the above.   Isaiah Knoll RN VAD Coordinator  Office: [phone]  24/7 Pager: [phone]

## 2024-02-13 ENCOUNTER — Encounter (HOSPITAL_COMMUNITY): Payer: Self-pay | Admitting: Internal Medicine

## 2024-02-13 ENCOUNTER — Other Ambulatory Visit: Payer: Self-pay

## 2024-02-13 ENCOUNTER — Inpatient Hospital Stay (HOSPITAL_COMMUNITY)
Admission: AD | Admit: 2024-02-13 | Discharge: 2024-02-18 | DRG: 812 | Disposition: A | Discharge status: Home / Self Care | Attending: Internal Medicine | Admitting: Internal Medicine

## 2024-02-13 DIAGNOSIS — K648 Other hemorrhoids: Secondary | ICD-10-CM | POA: Diagnosis present

## 2024-02-13 DIAGNOSIS — Y842 Radiological procedure and radiotherapy as the cause of abnormal reaction of the patient, or of later complication, without mention of misadventure at the time of the procedure: Secondary | ICD-10-CM | POA: Diagnosis present

## 2024-02-13 DIAGNOSIS — E861 Hypovolemia: Secondary | ICD-10-CM | POA: Diagnosis present

## 2024-02-13 DIAGNOSIS — D638 Anemia in other chronic diseases classified elsewhere: Secondary | ICD-10-CM | POA: Diagnosis not present

## 2024-02-13 DIAGNOSIS — I5020 Unspecified systolic (congestive) heart failure: Secondary | ICD-10-CM | POA: Diagnosis not present

## 2024-02-13 DIAGNOSIS — R945 Abnormal results of liver function studies: Secondary | ICD-10-CM

## 2024-02-13 DIAGNOSIS — N1831 Chronic kidney disease, stage 3a: Secondary | ICD-10-CM | POA: Diagnosis present

## 2024-02-13 DIAGNOSIS — I252 Old myocardial infarction: Secondary | ICD-10-CM

## 2024-02-13 DIAGNOSIS — K627 Radiation proctitis: Secondary | ICD-10-CM | POA: Diagnosis present

## 2024-02-13 DIAGNOSIS — E785 Hyperlipidemia, unspecified: Secondary | ICD-10-CM | POA: Diagnosis present

## 2024-02-13 DIAGNOSIS — K5730 Diverticulosis of large intestine without perforation or abscess without bleeding: Secondary | ICD-10-CM | POA: Diagnosis present

## 2024-02-13 DIAGNOSIS — I2510 Atherosclerotic heart disease of native coronary artery without angina pectoris: Secondary | ICD-10-CM | POA: Diagnosis present

## 2024-02-13 DIAGNOSIS — Z8546 Personal history of malignant neoplasm of prostate: Secondary | ICD-10-CM

## 2024-02-13 DIAGNOSIS — Z87891 Personal history of nicotine dependence: Secondary | ICD-10-CM

## 2024-02-13 DIAGNOSIS — Z95810 Presence of automatic (implantable) cardiac defibrillator: Secondary | ICD-10-CM | POA: Diagnosis present

## 2024-02-13 DIAGNOSIS — I130 Hypertensive heart and chronic kidney disease with heart failure and stage 1 through stage 4 chronic kidney disease, or unspecified chronic kidney disease: Secondary | ICD-10-CM | POA: Diagnosis present

## 2024-02-13 DIAGNOSIS — R791 Abnormal coagulation profile: Secondary | ICD-10-CM | POA: Diagnosis not present

## 2024-02-13 DIAGNOSIS — Z95811 Presence of heart assist device: Secondary | ICD-10-CM | POA: Diagnosis not present

## 2024-02-13 DIAGNOSIS — K2970 Gastritis, unspecified, without bleeding: Secondary | ICD-10-CM | POA: Diagnosis present

## 2024-02-13 DIAGNOSIS — Z7901 Long term (current) use of anticoagulants: Secondary | ICD-10-CM | POA: Diagnosis not present

## 2024-02-13 DIAGNOSIS — K449 Diaphragmatic hernia without obstruction or gangrene: Secondary | ICD-10-CM | POA: Diagnosis present

## 2024-02-13 DIAGNOSIS — Z955 Presence of coronary angioplasty implant and graft: Secondary | ICD-10-CM

## 2024-02-13 DIAGNOSIS — I480 Paroxysmal atrial fibrillation: Secondary | ICD-10-CM | POA: Diagnosis present

## 2024-02-13 DIAGNOSIS — I5042 Chronic combined systolic (congestive) and diastolic (congestive) heart failure: Secondary | ICD-10-CM | POA: Diagnosis present

## 2024-02-13 DIAGNOSIS — D128 Benign neoplasm of rectum: Secondary | ICD-10-CM | POA: Diagnosis present

## 2024-02-13 DIAGNOSIS — Z5986 Financial insecurity: Secondary | ICD-10-CM

## 2024-02-13 DIAGNOSIS — D125 Benign neoplasm of sigmoid colon: Secondary | ICD-10-CM | POA: Diagnosis present

## 2024-02-13 DIAGNOSIS — R7989 Other specified abnormal findings of blood chemistry: Secondary | ICD-10-CM

## 2024-02-13 DIAGNOSIS — D509 Iron deficiency anemia, unspecified: Secondary | ICD-10-CM

## 2024-02-13 DIAGNOSIS — D122 Benign neoplasm of ascending colon: Secondary | ICD-10-CM | POA: Diagnosis present

## 2024-02-13 DIAGNOSIS — Z79899 Other long term (current) drug therapy: Secondary | ICD-10-CM

## 2024-02-13 DIAGNOSIS — I5022 Chronic systolic (congestive) heart failure: Secondary | ICD-10-CM

## 2024-02-13 DIAGNOSIS — R7303 Prediabetes: Secondary | ICD-10-CM | POA: Diagnosis present

## 2024-02-13 DIAGNOSIS — D649 Anemia, unspecified: Principal | ICD-10-CM | POA: Diagnosis present

## 2024-02-13 DIAGNOSIS — I959 Hypotension, unspecified: Secondary | ICD-10-CM | POA: Diagnosis present

## 2024-02-13 LAB — OCCULT BLOOD X 1 CARD TO LAB, STOOL: Fecal Occult Bld: NEGATIVE

## 2024-02-13 LAB — FOLATE: Folate: 12.4 ng/mL (ref >5.9)

## 2024-02-13 LAB — IRON AND TIBC
Iron: 42 ug/dL — ABNORMAL LOW (ref 45–182)
Saturation Ratios: 21 % (ref 17.9–39.5)
TIBC: 204 ug/dL — ABNORMAL LOW (ref 250–450)
UIBC: 162 ug/dL

## 2024-02-13 LAB — COMPREHENSIVE METABOLIC PANEL WITH GFR
ALT: 43 U/L (ref 0–44)
AST: 63 U/L — ABNORMAL HIGH (ref 15–41)
Albumin: 2.8 g/dL — ABNORMAL LOW (ref 3.5–5.0)
Alkaline Phosphatase: 84 U/L (ref 38–126)
Anion gap: 8 (ref 5–15)
BUN: 19 mg/dL (ref 8–23)
CO2: 20 mmol/L — ABNORMAL LOW (ref 22–32)
Calcium: 8.5 mg/dL — ABNORMAL LOW (ref 8.9–10.3)
Chloride: 106 mmol/L (ref 98–111)
Creatinine, Ser: 1.39 mg/dL — ABNORMAL HIGH (ref 0.61–1.24)
GFR, Estimated: 55 mL/min — ABNORMAL LOW (ref >60)
Glucose, Bld: 111 mg/dL — ABNORMAL HIGH (ref 70–99)
Potassium: 3.5 mmol/L (ref 3.5–5.1)
Sodium: 134 mmol/L — ABNORMAL LOW (ref 135–145)
Total Bilirubin: 1.3 mg/dL — ABNORMAL HIGH (ref 0.0–1.2)
Total Protein: 6.8 g/dL (ref 6.5–8.1)

## 2024-02-13 LAB — LACTATE DEHYDROGENASE: LDH: 385 U/L — ABNORMAL HIGH (ref 98–192)

## 2024-02-13 LAB — CBC
HCT: 25.4 % — ABNORMAL LOW (ref 39.0–52.0)
Hemoglobin: 8.2 g/dL — ABNORMAL LOW (ref 13.0–17.0)
MCH: 31.2 pg (ref 26.0–34.0)
MCHC: 32.3 g/dL (ref 30.0–36.0)
MCV: 96.6 fL (ref 80.0–100.0)
Platelets: 295 K/uL (ref 150–400)
RBC: 2.63 MIL/uL — ABNORMAL LOW (ref 4.22–5.81)
RDW: 18.8 % — ABNORMAL HIGH (ref 11.5–15.5)
WBC: 12.7 K/uL — ABNORMAL HIGH (ref 4.0–10.5)
nRBC: 0 % (ref 0.0–0.2)

## 2024-02-13 LAB — TYPE AND SCREEN
ABO/RH(D): AB POS
Antibody Screen: NEGATIVE

## 2024-02-13 LAB — PROTIME-INR
INR: 3.9 — ABNORMAL HIGH (ref 0.8–1.2)
Prothrombin Time: 40.3 s — ABNORMAL HIGH (ref 11.4–15.2)

## 2024-02-13 LAB — FERRITIN: Ferritin: 441 ng/mL — ABNORMAL HIGH (ref 24–336)

## 2024-02-13 LAB — VITAMIN B12: Vitamin B-12: 2064 pg/mL — ABNORMAL HIGH (ref 180–914)

## 2024-02-13 MED ORDER — ATORVASTATIN CALCIUM 80 MG PO TABS
80.0000 mg | ORAL_TABLET | Freq: Every day | ORAL | Status: DC
  Administered 2024-02-13 – 2024-02-18 (×6): 80 mg via ORAL
  Filled 2024-02-13 (×6): qty 1

## 2024-02-13 MED ORDER — ACETAMINOPHEN 325 MG PO TABS: 650.0000 mg | ORAL_TABLET | ORAL | Status: DC | PRN

## 2024-02-13 MED ORDER — ENSURE PLUS HIGH PROTEIN PO LIQD
237.0000 mL | Freq: Once | ORAL | Status: AC
  Administered 2024-02-13: 237 mL via ORAL

## 2024-02-13 MED ORDER — ENSURE SURGERY PO LIQD
237.0000 mL | Freq: Two times a day (BID) | ORAL | Status: DC
  Administered 2024-02-13: 237 mL via ORAL
  Filled 2024-02-13 (×2): qty 237

## 2024-02-13 MED ORDER — TRAZODONE HCL 50 MG PO TABS
50.0000 mg | ORAL_TABLET | Freq: Every evening | ORAL | Status: DC | PRN
  Administered 2024-02-13 – 2024-02-17 (×3): 50 mg via ORAL
  Filled 2024-02-13 (×3): qty 1

## 2024-02-13 MED ORDER — ONDANSETRON HCL 4 MG/2ML IJ SOLN: 4.0000 mg | Freq: Four times a day (QID) | INTRAMUSCULAR | Status: DC | PRN

## 2024-02-13 MED ORDER — PANTOPRAZOLE SODIUM 40 MG PO TBEC
40.0000 mg | DELAYED_RELEASE_TABLET | Freq: Two times a day (BID) | ORAL | Status: DC
  Administered 2024-02-13 – 2024-02-18 (×11): 40 mg via ORAL
  Filled 2024-02-13 (×11): qty 1

## 2024-02-13 MED ORDER — FINASTERIDE 5 MG PO TABS
5.0000 mg | ORAL_TABLET | Freq: Every day | ORAL | Status: DC
  Administered 2024-02-13 – 2024-02-18 (×6): 5 mg via ORAL
  Filled 2024-02-13 (×6): qty 1

## 2024-02-13 MED ORDER — DOCUSATE SODIUM 100 MG PO CAPS
200.0000 mg | ORAL_CAPSULE | Freq: Every day | ORAL | Status: DC
  Administered 2024-02-13 – 2024-02-18 (×5): 200 mg via ORAL
  Filled 2024-02-13 (×6): qty 2

## 2024-02-13 NOTE — H&P (Addendum)
 Advanced Heart Failure Team History and Physical Note   PCP:  Ziglar, Devere POUR, MD  PCP-Cardiology: Debby Sor, MD (Inactive)    HF Cardiologist: Dr. Cherrie Reason for Admission: Symptomatic Anemia HPI:    Steven [name] is a 68 y.o. 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 Underwent HM-3 VAD placement on 03/08/23   Originally from [place], suffered a large out of hospital anterior wall myocardial infarction January 2012.  He presented after a three-hour delay to the hospital and was taken emergently for cath with DES to LAD. EF was 35-40% at time of MI. Unfortunately, did not have recovery of EF. C/w progressive decline in systolic function over the years. Ultimately underwent ICD.    Admitted on 9/24 w/ NYHA Class IV symptoms and low output. Echo showed EF < 20%, RV mildly reduced. RHC showed with low output. CI 2.1. Calcified pericardium (no H/o TB). Underwent HMIII LVAD implant on 03/08/23. Intra-op TEE LVEF 15%, RV normal. Post-op course was c/b afib w/ RVR, requiring IV amiodarone . Converted back to NSR.   Seen on 11/24 for unscheduled f/u due to low flow alarms on VAD. Pacer reprogrammed to let intrinsic rate come through (was RV pacing at 80). Feels much better. Walking 40 mins every day in the park. Denies orthopnea or PND. No fevers, chills or problems with driveline. No bleeding, melena or neuro symptoms. No VAD alarms. Taking all meds as prescribed.    Seen 01/15/24 for scheduled follow up. Doing well. Was having some dizziness while bending over and some LLE swelling. Recent US  showed DVT. No issues with driveline. No VAD alarms. Compliant with meds.   [facility] 02/03/24 with asymptomatic LOW FLOW alarm while lying in bed on side. Alarm resolved with repositioning and drinking 2 bottles of water. He was then seen yesterday 8/19 in VAD  clinic, reported no other VAD alarms. However was feeling lightheaded and dizzy with ambulation. Denied signs of bleeding. However hgb came back at 8.4. He was then admitted from home today with symptomatic anemia.   On the floor, MAP 75, HR 73, SpO2 100% on RA. Labs today showed K 3.5, CO2 20, Cr 1.39, tbili 1.3, LDH 385, INR 3.9, and HGB 8.2  LVAD Interrogation HM 3: Speed: 5650 Flow: 4.7 PI: 3.4 Power: 4.2 Numerous PI events this morning  Home Medications Prior to Admission medications   Medication Sig Start Date End Date Taking? Authorizing Provider  acetaminophen  (TYLENOL ) 500 MG tablet Take 1,000 mg by mouth daily as needed for mild pain (pain score 1-3) or moderate pain (pain score 4-6).   Yes [provider]  atorvastatin  (LIPITOR ) 80 MG tablet Take 1 tablet (80 mg total) by mouth daily. 08/02/23  Yes Kyrese Gartman, Toribio SAUNDERS, MD  finasteride  (PROSCAR ) 5 MG tablet Take 1 tablet (5 mg total) by mouth daily. 11/13/23  Yes Lakeisha Waldrop, Toribio SAUNDERS, MD  losartan  (COZAAR ) 50 MG tablet Take 1 tablet (50 mg total) by mouth 2 (two) times daily. 11/20/23 03/09/24 Yes Asyia Hornung, Toribio SAUNDERS, MD  Multiple Vitamins-Minerals (CERTAVITE/ANTIOXIDANTS) TABS Take 1 tablet by mouth daily. 03/26/23  Yes Love, Sharlet RAMAN, PA-C  pantoprazole  (PROTONIX ) 40 MG tablet Take 1 tablet (40 mg total) by mouth daily. 11/08/23  Yes Matthew Pais, Toribio SAUNDERS, MD  tamsulosin  (FLOMAX ) 0.4 MG CAPS capsule Take 1 capsule (0.4 mg total) by mouth daily. 01/28/24  Yes  traZODone  (DESYREL ) 50 MG tablet Take 1 tablet (50 mg total) by mouth at bedtime as needed for sleep. 12/31/23  Yes Chanceler Pullin, Toribio SAUNDERS, MD  warfarin (COUMADIN ) 2.5 MG tablet Take 2.5 mg (1 tablet) every Mon, Fri; 5 mg (2 tablets) all other days; or as directed by heart failure clinic Patient taking differently: Take 2.5-5 mg by mouth See admin instructions. Take 2.5 mg by mouth once daily on Monday and Friday. Take 5 mg by mouth once daily on Sunday, Tuesday, Wednesday, and Thursday,  Saturday, and Sunday. 01/15/24  Yes Etoy Mcdonnell, Toribio SAUNDERS, MD  docusate sodium  (COLACE) 100 MG capsule Take 2 capsules (200 mg total) by mouth daily. Patient not taking: Reported on 02/13/2024 05/15/23   Simmie Camerer, Toribio SAUNDERS, MD  torsemide  (DEMADEX ) 20 MG tablet Take 1 tablet (20 mg total) by mouth daily as needed. Patient not taking: Reported on 02/13/2024 03/27/23   Maurice Sharlet GORMAN DEVONNA    Past Medical History: Past Medical History:  Diagnosis Date   Acute MI, anterior wall (HCC)    AICD (automatic cardioverter/defibrillator) present    CAD (coronary artery disease)    2D ECHO, 07/13/2011 - EF <25%, LV moderatelty dilated, LA moderately dilatedLEXISCAN, 12/14/2011 - moderate-severe perfusion defect seen in the basal anteroseptal, mid anterior, apicacl anterior, apical, apical inferior, and apical lateral regions, post-stress EF 25%, new EKG changes from baseline abnormalities   Cancer [facility])    Prostate   CHF (congestive heart failure) (HCC) 2012   Hypertension 08/08/2021   Inguinal hernia, left    Pneumonia    November 2023   Pre-diabetes     Past Surgical History: Past Surgical History:  Procedure Laterality Date   CARDIAC CATHETERIZATION  11/25/2010   Predilation balloon-Apex monorail 2x50mm, Cutting balloon-2.25x101mm, resulting in a reduction of 100% stenosis down to less than 10%   CARDIAC CATHETERIZATION  07/12/2010   LAD stented with a 3.5x32mm bare-metal Veriflex stent resulting in a reduction of 100% lesion to 0%   CARDIAC DEFIBRILLATOR PLACEMENT  03/02/2011   Medtronic Protecta XT DR, model #I685IMH, serial #[id number] H   COLONOSCOPY     GOLD SEED IMPLANT N/A 07/21/2021   Procedure: GOLD SEED IMPLANT;  Surgeon: Renda Glance, MD;  Location: [facility];  Service: Urology;  Laterality: N/A;  NEEDS 30 MIN   HERNIA REPAIR  2018   ICD GENERATOR CHANGEOUT N/A 02/24/2019   Procedure: ICD GENERATOR CHANGEOUT;  Surgeon: Francyne Headland, MD;  [facility]: [facility];  Service:  Cardiovascular;  Laterality: N/A;   INSERTION OF IMPLANTABLE LEFT VENTRICULAR ASSIST DEVICE N/A 03/08/2023   Procedure: INSERTION OF IMPLANTABLE LEFT VENTRICULAR ASSIST DEVICE;  Surgeon: Lucas Dorise POUR, MD;  Location: [facility] OR;  Service: Open Heart Surgery;  Laterality: N/A;   PACEMAKER PLACEMENT  07/14/2010   Temporary placement of pacemaker, if rhythm issue continues will need a permanent device   RIGHT HEART CATH N/A 02/28/2023   Procedure: RIGHT HEART CATH;  Surgeon: Darron Deatrice LABOR, MD;  [facility]: [facility];  Service: Cardiovascular;  Laterality: N/A;   RIGHT HEART CATH N/A 03/06/2023   Procedure: RIGHT HEART CATH;  Surgeon: Gardenia Led, DO;  [facility];  Service: Cardiovascular;  Laterality: N/A;   SPACE OAR INSTILLATION N/A 07/21/2021   Procedure: SPACE OAR INSTILLATION;  Surgeon: Renda Glance, MD;  Location: [facility];  Service: Urology;  Laterality: N/A;   TEE WITHOUT CARDIOVERSION N/A 03/08/2023   Procedure: TRANSESOPHAGEAL ECHOCARDIOGRAM;  Surgeon: Lucas Dorise POUR, MD;  Location: [facility]  OR;  Service: Open Heart Surgery;  Laterality: N/A;   Family History:  No family history on file.  Social History: Social History   Socioeconomic History   Marital status: Single    Spouse name: Not on file   Number of children: 1   Years of education: Not on file   Highest education level: Bachelor's degree (e.g., BA, AB, BS)  Occupational History   Occupation: works on Systems analyst  Tobacco Use   Smoking status: Former    Current packs/day: 0.00    Types: Cigarettes, Cigars    Quit date: 2012    Years since quitting: 13.6    Passive exposure: Past   Smokeless tobacco: Never  Vaping Use   Vaping status: Never Used  Substance and Sexual Activity   Alcohol use: Yes    Alcohol/week: 2.0 - 3.0 standard drinks of alcohol    Types: 2 - 3 drink(s) per week   Drug use: No   Sexual activity: Not Currently  Other Topics Concern   Not on file  Social History Narrative    Not on file   Social Drivers of Health   Financial Resource Strain: Medium Risk (06/26/2023)   Overall Financial Resource Strain (CARDIA)    Difficulty of Paying Living Expenses: Somewhat hard  Food Insecurity: No Food Insecurity (07/25/2023)   Hunger Vital Sign    Worried About Running Out of Food in the Last Year: Never true    Ran Out of Food in the Last Year: Never true  Transportation Needs: No Transportation Needs (07/25/2023)   PRAPARE - Administrator, Civil Service (Medical): No    Lack of Transportation (Non-Medical): No  Physical Activity: Not on file  Stress: Not on file  Social Connections: Unknown (11/08/2021)   Received from [facility]   Social Network    Social Network: Not on file    Allergies:  No Known Allergies  Objective:    Vital Signs:   Temp:  [97.7 F (36.5 C)] 97.7 F (36.5 C) (08/20 1100) Pulse Rate:  [109-130] 130 (08/20 1200) Resp:  [17-22] 22 (08/20 1200) BP: (81)/(69) 81/69 (08/20 1100) SpO2:  [100 %] 100 % (08/20 1200) Weight:  [68.7 kg] 68.7 kg (08/20 1000) Last BM Date :  (PTA) Filed Weights   02/13/24 1000  Weight: 68.7 kg   Physical Exam     General: Well appearing. No distress on RA Cardiac: JVP flat. Mechanical heart sounds with LVAD hum present.  Resp: Lung sounds clear and equal B/L Abdomen: Soft, non-tender, non-distended.  Driveline: Dressing C/D/I. No drainage or redness. Anchor in place. Extremities: Warm and dry. No edema.  Telemetry   SR in 70s (personally reviewed)  Labs    Basic Metabolic Panel: Recent Labs  Lab 02/13/24 1050  NA 134*  K 3.5  CL 106  CO2 20*  GLUCOSE 111*  BUN 19  CREATININE 1.39*  CALCIUM  8.5*   Liver Function Tests: Recent Labs  Lab 02/13/24 1050  AST 63*  ALT 43  ALKPHOS 84  BILITOT 1.3*  PROT 6.8  ALBUMIN  2.8*   CBC: Recent Labs  Lab 02/12/24 1122 02/13/24 1050  WBC 15.6* 12.7*  HGB 8.4* 8.2*  HCT 26.7* 25.4*  MCV 96.4 96.6  PLT 310 295   BNP  (last 3 results) Recent Labs    02/26/23 0231 03/09/23 0429 03/15/23 0105  BNP 1,060.3* 480.8* 543.6*   Coagulation Studies: Recent Labs    02/12/24 1122 02/13/24 1050  LABPROT  40.2* 40.3*  INR 3.9* 3.9*    Patient Profile   Steven [name] is a 67 y.o. 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 Underwent HM-3 VAD placement on 03/08/23. No admitted with symptomatic anemia.  Assessment/Plan   Symptomatic Anemia - presented to VAD Clinic yesterday with orthostasis and lightheadedness after LOW flow alarm last week - Hgb on labs 8.4, now 8.2 today - No obvious source of bleeding - send fecal occult and check Fe studies - Supra-therapeutic INR at 3.9 - give Ensure x2 to help lower INR; hold warfarin - consult to GI for possible AVM - increase protonix  to 40 mg bid for now  2. Chronic combined systolic and diastolic heart failure - Due to ICM with anterior MI in 2012 - Echo 02/27/23: EF <20%, LV with GHK, RV mildly reduced, GIIDD, LA mod dilated, mild MR.  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 GDMT limited by renal function and hypotension.  - S/P HM3 LVAD implant 03/08/23.   - Stable NYHA Class I. Appears dry.  - Hold diuretics. No SGLT2i with hypovolemia - Hold losartan . MAP in 70s.   3. VAD  - Fixed speed 5600. Last RAMP 3/25. - Hgb as above.  - LDH ok, INR as above.  - Driveline ok; MAP ok - INR 3.9. Goal 2.0-3.0. Hold warfarin, give Ensure. D/w PharmD   4. 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  - No s/s angina - off asa as above.  - Continue statin   5. CKD stage 3a: Creatinine baseline 1.1-1.3 - cardiorenal, resolved w/ LVAD support - sCr 1.39 today   6. H/o VT s/p Medtronic ICD: likely scar-mediated - No recent VT   6. Paroxysmal Atrial fibrillation:  - In NSR. Off amio. Holding warfarin.   [place]  Lee, NP 02/13/2024, 1:04 PM  Advanced Heart Failure Team Pager [phone] (M-F; 7a - 5p)  Please contact [facility] for night-coverage after hours (4p -7a ) and weekends on [email or web address]  Patient seen and examined with the above-signed Advanced Practice Provider and/or Housestaff. I personally reviewed laboratory data, imaging studies and relevant notes. I independently examined the patient and formulated the important aspects of the plan. I have edited the note to reflect any of my changes or salient points. I have personally discussed the plan with the patient and/or family.  68 y/o male with severe systolic HF due to iCM s/p HM-III VAD 9/24  Seen in clinic yesterday with orthostasis and LOW FLOW alert. Labs back with > 4g/dl drop in hgb down to 8.4 over past month.   Denies BRBPR or melena. No ab pain   INR 3.9 yesterday  General:  NAD.  HEENT: normal  Neck: supple. JVP not elevated.  Carotids 2+ bilat; no bruits. No lymphadenopathy or thryomegaly appreciated. Cor: LVAD hum.  Lungs: Clear. Abdomen: soft, nontender, non-distended. No hepatosplenomegaly. No bruits or masses. Good bowel sounds. Driveline site clean. Anchor in place.  Extremities: no cyanosis, clubbing, rash. Warm no edema  Neuro: alert & oriented x 3. No focal deficits. Moves all 4 without problem   Situation concerning for occult GI bleeding with symptomatic anemia.   Will hold warfarin. Start PPI. Consult GI.   MAPs currently stable. VAD interrogated personally. Parameters stable.  Toribio Fuel, MD  2:03 PM

## 2024-02-13 NOTE — Plan of Care (Signed)

## 2024-02-13 NOTE — Consult Note (Addendum)
 Consultation Note   Referring Provider: Heart failure Team PCP: Ziglar, Susan K, MD Primary Gastroenterologist:    [facility]     Reason for Consultation:  Anemia DOA: 02/13/2024         Hospital Day: 1   ASSESSMENT    Patient Profile:  68 y.o. year old male from [place] with a medical history including but not limited to atrial fibrillation, CAD /remote MI status post DES , chronic combined systolic and diastolic heart failure, LV thrombus , CKD stage IIIa, subdural hematoma, prostate cancer  Subacute normocytic anemia.  Declining hemoglobin over the last 3 months. Presenting hemoglobin 8.2, baseline ~ 15. No active GI bleeding but could have intermittent GI bleeding, possibly AVM disease in setting of LVAD.        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  INR 3.9  Hypotension.  SBP 81/69, heart rate 109. Asymptomatic  Mildly elevated LFTs, cholestatic pattern.  Trivial elevation in Tbili and AST.    Principal Problem:   Symptomatic anemia   PLAN:   --Discussed INR with Heart Failure Team. Plan is to let INR drift for now.  --In the interim, monitor H/H --Pantoprazole  40 mg BID --GI will follow along. Probably needs EGD this admission (once INR) down to evaluate for AVMs.  --Will check iron studies, b12, folate --Monitor LFts  HPI    Patient has just admitted to the hospital with anemia ( H+P) pending.  Patient tells me he was admitted for drop in hemoglobin and concern that he was bleeding internally.  He denies having any overt GI blood loss.  Specifically, no dark stools or red blood in stools.  He has not had any nausea nor vomiting .  He may have inadvertently lost a few pounds lately due to a decrease in appetite for unclear reasons . For a couple days last week he did  have some vague, mild mid lower abdominal discomfort that resolved spontaneously .  He has not had any change in his bowel habits .  No hematuria .  Occasionally he has scant amount of blood in nasal drainage .  No NSAID use.  No history of PUD.  Patient is uncertain whether he has ever had a colonoscopy.  No known family history of colon cancer  Relevant workup thus far: Baseline hemoglobin around 15.  It has been declining over the last several weeks (11.3 >> 10.1 >> 8.4). BUN 19, Cr 1.39. AST 63, Tbili 1.4, albumin  2.8, INR 3.9  Labs and Imaging:  Recent Labs    02/12/24 1122 02/13/24 1050  WBC 15.6* 12.7*  HGB 8.4* 8.2*  HCT 26.7* 25.4*  MCV 96.4 96.6  PLT 310 295     Past Medical History:  Diagnosis Date   Acute MI, anterior wall (HCC)    AICD (automatic cardioverter/defibrillator) present    CAD (coronary artery disease)    2D ECHO, 07/13/2011 - EF <25%, LV moderatelty dilated, LA moderately dilatedLEXISCAN, 12/14/2011 - moderate-severe perfusion defect seen in the basal anteroseptal, mid anterior, apicacl anterior, apical, apical inferior, and apical lateral regions, post-stress EF 25%, new EKG  changes from baseline abnormalities   Cancer [facility])    Prostate   CHF (congestive heart failure) (HCC) 2012   Hypertension 08/08/2021   Inguinal hernia, left    Pneumonia    November 2023   Pre-diabetes     Past Surgical History:  Procedure Laterality Date   CARDIAC CATHETERIZATION  11/25/2010   Predilation balloon-Apex monorail 2x53mm, Cutting balloon-2.25x75mm, resulting in a reduction of 100% stenosis down to less than 10%   CARDIAC CATHETERIZATION  07/12/2010   LAD stented with a 3.5x34mm bare-metal Veriflex stent resulting in a reduction of 100% lesion to 0%   CARDIAC DEFIBRILLATOR PLACEMENT  03/02/2011   Medtronic Protecta XT DR, model #[id number], serial #[id number] H   COLONOSCOPY     GOLD SEED IMPLANT N/A 07/21/2021   Procedure: GOLD SEED IMPLANT;  Surgeon: Renda Glance,  MD;  Location: [facility];  Service: Urology;  Laterality: N/A;  NEEDS 30 MIN   HERNIA REPAIR  2018   ICD GENERATOR CHANGEOUT N/A 02/24/2019   Procedure: ICD GENERATOR CHANGEOUT;  Surgeon: Francyne Headland, MD;  [facility];  Service: Cardiovascular;  Laterality: N/A;   INSERTION OF IMPLANTABLE LEFT VENTRICULAR ASSIST DEVICE N/A 03/08/2023   Procedure: INSERTION OF IMPLANTABLE LEFT VENTRICULAR ASSIST DEVICE;  Surgeon: Lucas Dorise POUR, MD;  Location: [facility] OR;  Service: Open Heart Surgery;  Laterality: N/A;   PACEMAKER PLACEMENT  07/14/2010   Temporary placement of pacemaker, if rhythm issue continues will need a permanent device   RIGHT HEART CATH N/A 02/28/2023   Procedure: RIGHT HEART CATH;  Surgeon: Darron Deatrice LABOR, MD;  [facility];  Service: Cardiovascular;  Laterality: N/A;   RIGHT HEART CATH N/A 03/06/2023   Procedure: RIGHT HEART CATH;  Surgeon: Gardenia Led, DO;  [facility];  Service: Cardiovascular;  Laterality: N/A;   SPACE OAR INSTILLATION N/A 07/21/2021   Procedure: SPACE OAR INSTILLATION;  Surgeon: Renda Glance, MD;  Location: [facility];  Service: Urology;  Laterality: N/A;   TEE WITHOUT CARDIOVERSION N/A 03/08/2023   Procedure: TRANSESOPHAGEAL ECHOCARDIOGRAM;  Surgeon: Lucas Dorise POUR, MD;  [facility];  Service: Open Heart Surgery;  Laterality: N/A;   FMH: No known colon cancer   Prior to Admission medications   Medication Sig Start Date End Date Taking? Authorizing Provider  atorvastatin  (LIPITOR ) 80 MG tablet Take 1 tablet (80 mg total) by mouth daily. 08/02/23   Bensimhon, Toribio SAUNDERS, MD  docusate sodium  (COLACE) 100 MG capsule Take 2 capsules (200 mg total) by mouth daily. 05/15/23   Bensimhon, Toribio SAUNDERS, MD  finasteride  (PROSCAR ) 5 MG tablet Take 1 tablet (5 mg total) by mouth daily. 11/13/23   Bensimhon, Toribio SAUNDERS, MD  losartan  (COZAAR ) 50 MG tablet Take 1 tablet (50 mg total) by mouth 2 (two) times daily. 11/20/23 03/09/24   Bensimhon, Toribio SAUNDERS, MD  Multiple Vitamins-Minerals (CERTAVITE/ANTIOXIDANTS) TABS Take 1 tablet by mouth daily. 03/26/23   Love, Sharlet RAMAN, PA-C  pantoprazole  (PROTONIX ) 40 MG tablet Take 1 tablet (40 mg total) by mouth daily. 11/08/23   Bensimhon, Toribio SAUNDERS, MD  tamsulosin  (FLOMAX ) 0.4 MG CAPS capsule Take 1 capsule (0.4 mg total) by mouth daily after supper. 06/21/23   Nieves Cough, MD  tamsulosin  (FLOMAX ) 0.4 MG CAPS capsule Take 1 capsule (0.4 mg total) by mouth daily. 01/28/24     torsemide  (DEMADEX ) 20 MG tablet Take 1 tablet (20 mg total) by mouth daily as needed. 03/27/23   Love, Sharlet RAMAN, PA-C  traZODone  (  DESYREL ) 50 MG tablet Take 1 tablet (50 mg total) by mouth at bedtime as needed for sleep. 12/31/23   Bensimhon, Toribio SAUNDERS, MD  warfarin (COUMADIN ) 2.5 MG tablet Take 2.5 mg (1 tablet) every Mon, Fri; 5 mg (2 tablets) all other days; or as directed by heart failure clinic 01/15/24   Bensimhon, Toribio SAUNDERS, MD    Current Facility-Administered Medications  Medication Dose Route Frequency Provider Last Rate Last Admin   acetaminophen  (TYLENOL ) tablet 650 mg  650 mg Oral Q4H PRN Lee, Jordan, NP       ondansetron  (ZOFRAN ) injection 4 mg  4 mg Intravenous Q6H PRN Lee, [place], NP        Allergies as of 02/12/2024   (No Known Allergies)    Social History   Socioeconomic History   Marital status: Single    Spouse name: Not on file   Number of children: 1   Years of education: Not on file   Highest education level: Bachelor's degree (e.g., BA, AB, BS)  Occupational History   Occupation: works on Systems analyst  Tobacco Use   Smoking status: Former    Current packs/day: 0.00    Types: Cigarettes, Cigars    Quit date: 2012    Years since quitting: 13.6    Passive exposure: Past   Smokeless tobacco: Never  Vaping Use   Vaping status: Never Used  Substance and Sexual Activity   Alcohol use: Yes    Alcohol/week: 2.0 - 3.0 standard drinks of alcohol    Types: 2 - 3 drink(s) per week   Drug use:  No   Sexual activity: Not Currently  Other Topics Concern   Not on file  Social History Narrative   Not on file   Social Drivers of Health   Financial Resource Strain: Medium Risk (06/26/2023)   Overall Financial Resource Strain (CARDIA)    Difficulty of Paying Living Expenses: Somewhat hard  Food Insecurity: No Food Insecurity (07/25/2023)   Hunger Vital Sign    Worried About Running Out of Food in the Last Year: Never true    Ran Out of Food in the Last Year: Never true  Transportation Needs: No Transportation Needs (07/25/2023)   PRAPARE - Administrator, Civil Service (Medical): No    Lack of Transportation (Non-Medical): No  Physical Activity: Not on file  Stress: Not on file  Social Connections: Unknown (11/08/2021)   Received from [facility]   Social Network    Social Network: Not on file  Intimate Partner Violence: Not At Risk (07/25/2023)   Humiliation, Afraid, Rape, and Kick questionnaire    Fear of Current or Ex-Partner: No    Emotionally Abused: No    Physically Abused: No    Sexually Abused: No     Code Status   Code Status: Partial Code  Review of Systems: All systems reviewed and negative except where noted in HPI.  Physical Exam: Vital signs in last 24 hours: Weight:  [68.7 kg] 68.7 kg (08/20 1000)    General:  Pleasant male in NAD Psych:  Cooperative. Normal mood and affect Eyes: Pupils equal Ears:  Normal auditory acuity Nose: No deformity, discharge or lesions Neck:  Supple, no masses felt Lungs:  Clear to auscultation.  Heart:  Regular rate, regular rhythm. LVAD hum Abdomen:  Soft, nondistended, nontender, active bowel sounds, no masses felt Rectal :  Deferred Msk: Symmetrical without gross deformities.  Neurologic:  Alert, oriented, grossly normal neurologically Extremities : No edema Skin:  Intact without significant lesions.    Intake/Output from previous day: No intake/output data recorded. Intake/Output this  shift:  No intake/output data recorded.   Vina Dasen, NP-C   02/13/2024, 11:33 AM  I personally saw the patient and performed a substantive portion of this encounter (>50% time spent), including a complete performance of at least one of the key components (MDM, Hx and/or Exam), in conjunction with the APP.  I agree with the APP's note, impression, and recommendations with additional input as follows.   68 year old male with history of A-fib, CAD s/p PCI, HFrEF (EF <20%) s/p LVAD, VT s/p ICD, LV thrombus on warfarin, CKD, prostate cancer presented with symptomatic anemia.  No overt GI blood loss.  Hemoglobin dropped from 10.1 2 weeks ago to 8.4 upon arrival.  INR was supratherapeutic at 3.9.  Iron studies are consistent with a combination of IDA and ACD.  Patient has never had an EGD to his knowledge and he thinks that his last colonoscopy was a long time ago.  Will need to wait for his INR to improve before considering any endoscopic evaluation.  Most likely will start off with an EGD but patient needs a colonoscopy at some point as well.  Estefana Kidney, MD

## 2024-02-13 NOTE — Progress Notes (Addendum)
 LVAD Coordinator Rounding Note:  Admitted 02/13/24 to HF service due to GIB.   HM3 LVAD implanted on 03/08/23 by Dr Lucas under DT criteria.  Pt presented to VAD clinic yesterday for driveline care and labs. Pt states that he has not being feeling well recently and reports lightheadedness at times along with fatigue. CBC resulted with 4 gram drop in hgb over the past few weeks. Decision made to direct admit pt today for GI work up.  Pt lying in bed on my arrival. Pt tells me that he has been nauseous an unable to eat for about a week. He denies any blood in his stool and tells me that his stool is yellow. Denies any vomiting. He states that he has lost a few pounds because he is unable to eat.   Vital signs: Temp: 97.7 HR: 80 paced Doppler Pressure: 75 Auto BP: 81/69 (75) O2 Sat: 100% on RA Wt: 151.4  lbs    LVAD interrogation reveals:  Speed: 5600 Flow: 4.7 Power: 4.8 w  PI: 3.1  Alarms: none  Events: 60+ PI events today Hematocrit: 20-DO NOT CHANGE  Fixed speed: 5600 Low speed limit: 5300   Drive Line: Existing VAD dressing CDI. Anchor secure. Next dressing change due 02/19/24 bedside nurse or VAD coordinator.  Labs:  LDH trend:   INR trend: 3.9  Anticoagulation Plan: -INR Goal: 2-2.5 -ASA Dose: 81 mg   Blood Products:   Device: -Medtronic -Therapies: ON  -VF > 200BMP  Arrythmias: hx afib  Infection:   Adverse Events on VAD:  Plan/Recommendations:  1. Please page VAD coordinator with any equipment issues or driveline problems. 2. Weekly dressing change by bedside nurse or VAD coordinator.   Lauraine Ip RN,BSN VAD Coordinator  Office: [phone]  24/7 Pager: [phone]

## 2024-02-13 NOTE — H&P (View-Only) (Signed)
 Consultation Note   Referring Provider: Heart failure Team PCP: Ziglar, Susan K, MD Primary Gastroenterologist:    [facility]     Reason for Consultation:  Anemia DOA: 02/13/2024         Hospital Day: 1   ASSESSMENT    Patient Profile:  68 y.o. year old male from [place] with a medical history including but not limited to atrial fibrillation, CAD /remote MI status post DES , chronic combined systolic and diastolic heart failure, LV thrombus , CKD stage IIIa, subdural hematoma, prostate cancer  Subacute normocytic anemia.  Declining hemoglobin over the last 3 months. Presenting hemoglobin 8.2, baseline ~ 15. No active GI bleeding but could have intermittent GI bleeding, possibly AVM disease in setting of LVAD.        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  INR 3.9  Hypotension.  SBP 81/69, heart rate 109. Asymptomatic  Mildly elevated LFTs, cholestatic pattern.  Trivial elevation in Tbili and AST.    Principal Problem:   Symptomatic anemia   PLAN:   --Discussed INR with Heart Failure Team. Plan is to let INR drift for now.  --In the interim, monitor H/H --Pantoprazole  40 mg BID --GI will follow along. Probably needs EGD this admission (once INR) down to evaluate for AVMs.  --Will check iron studies, b12, folate --Monitor LFts  HPI    Patient has just admitted to the hospital with anemia ( H+P) pending.  Patient tells me he was admitted for drop in hemoglobin and concern that he was bleeding internally.  He denies having any overt GI blood loss.  Specifically, no dark stools or red blood in stools.  He has not had any nausea nor vomiting .  He may have inadvertently lost a few pounds lately due to a decrease in appetite for unclear reasons . For a couple days last week he did  have some vague, mild mid lower abdominal discomfort that resolved spontaneously .  He has not had any change in his bowel habits .  No hematuria .  Occasionally he has scant amount of blood in nasal drainage .  No NSAID use.  No history of PUD.  Patient is uncertain whether he has ever had a colonoscopy.  No known family history of colon cancer  Relevant workup thus far: Baseline hemoglobin around 15.  It has been declining over the last several weeks (11.3 >> 10.1 >> 8.4). BUN 19, Cr 1.39. AST 63, Tbili 1.4, albumin  2.8, INR 3.9  Labs and Imaging:  Recent Labs    02/12/24 1122 02/13/24 1050  WBC 15.6* 12.7*  HGB 8.4* 8.2*  HCT 26.7* 25.4*  MCV 96.4 96.6  PLT 310 295     Past Medical History:  Diagnosis Date   Acute MI, anterior wall (HCC)    AICD (automatic cardioverter/defibrillator) present    CAD (coronary artery disease)    2D ECHO, 07/13/2011 - EF <25%, LV moderatelty dilated, LA moderately dilatedLEXISCAN, 12/14/2011 - moderate-severe perfusion defect seen in the basal anteroseptal, mid anterior, apicacl anterior, apical, apical inferior, and apical lateral regions, post-stress EF 25%, new EKG  changes from baseline abnormalities   Cancer [facility])    Prostate   CHF (congestive heart failure) (HCC) 2012   Hypertension 08/08/2021   Inguinal hernia, left    Pneumonia    November 2023   Pre-diabetes     Past Surgical History:  Procedure Laterality Date   CARDIAC CATHETERIZATION  11/25/2010   Predilation balloon-Apex monorail 2x53mm, Cutting balloon-2.25x75mm, resulting in a reduction of 100% stenosis down to less than 10%   CARDIAC CATHETERIZATION  07/12/2010   LAD stented with a 3.5x34mm bare-metal Veriflex stent resulting in a reduction of 100% lesion to 0%   CARDIAC DEFIBRILLATOR PLACEMENT  03/02/2011   Medtronic Protecta XT DR, model #[id number], serial #[id number] H   COLONOSCOPY     GOLD SEED IMPLANT N/A 07/21/2021   Procedure: GOLD SEED IMPLANT;  Surgeon: Renda Glance,  MD;  Location: [facility] ORS;  Service: Urology;  Laterality: N/A;  NEEDS 30 MIN   HERNIA REPAIR  2018   ICD GENERATOR CHANGEOUT N/A 02/24/2019   Procedure: ICD GENERATOR CHANGEOUT;  Surgeon: Francyne Headland, MD;  Location: [facility] INVASIVE CV LAB;  Service: Cardiovascular;  Laterality: N/A;   INSERTION OF IMPLANTABLE LEFT VENTRICULAR ASSIST DEVICE N/A 03/08/2023   Procedure: INSERTION OF IMPLANTABLE LEFT VENTRICULAR ASSIST DEVICE;  Surgeon: Lucas Dorise POUR, MD;  Location: [facility] OR;  Service: Open Heart Surgery;  Laterality: N/A;   PACEMAKER PLACEMENT  07/14/2010   Temporary placement of pacemaker, if rhythm issue continues will need a permanent device   RIGHT HEART CATH N/A 02/28/2023   Procedure: RIGHT HEART CATH;  Surgeon: Darron Deatrice LABOR, MD;  Location: [facility] INVASIVE CV LAB;  Service: Cardiovascular;  Laterality: N/A;   RIGHT HEART CATH N/A 03/06/2023   Procedure: RIGHT HEART CATH;  Surgeon: Gardenia Led, DO;  Location: [facility] INVASIVE CV LAB;  Service: Cardiovascular;  Laterality: N/A;   SPACE OAR INSTILLATION N/A 07/21/2021   Procedure: SPACE OAR INSTILLATION;  Surgeon: Renda Glance, MD;  Location: [facility] ORS;  Service: Urology;  Laterality: N/A;   TEE WITHOUT CARDIOVERSION N/A 03/08/2023   Procedure: TRANSESOPHAGEAL ECHOCARDIOGRAM;  Surgeon: Lucas Dorise POUR, MD;  [facility];  Service: Open Heart Surgery;  Laterality: N/A;   FMH: No known colon cancer   Prior to Admission medications   Medication Sig Start Date End Date Taking? Authorizing Provider  atorvastatin  (LIPITOR ) 80 MG tablet Take 1 tablet (80 mg total) by mouth daily. 08/02/23   Bensimhon, Toribio SAUNDERS, MD  docusate sodium  (COLACE) 100 MG capsule Take 2 capsules (200 mg total) by mouth daily. 05/15/23   Bensimhon, Toribio SAUNDERS, MD  finasteride  (PROSCAR ) 5 MG tablet Take 1 tablet (5 mg total) by mouth daily. 11/13/23   Bensimhon, Toribio SAUNDERS, MD  losartan  (COZAAR ) 50 MG tablet Take 1 tablet (50 mg total) by mouth 2 (two) times daily. 11/20/23 03/09/24   Bensimhon, Toribio SAUNDERS, MD  Multiple Vitamins-Minerals (CERTAVITE/ANTIOXIDANTS) TABS Take 1 tablet by mouth daily. 03/26/23   Love, Sharlet RAMAN, PA-C  pantoprazole  (PROTONIX ) 40 MG tablet Take 1 tablet (40 mg total) by mouth daily. 11/08/23   Bensimhon, Toribio SAUNDERS, MD  tamsulosin  (FLOMAX ) 0.4 MG CAPS capsule Take 1 capsule (0.4 mg total) by mouth daily after supper. 06/21/23   Nieves Cough, MD  tamsulosin  (FLOMAX ) 0.4 MG CAPS capsule Take 1 capsule (0.4 mg total) by mouth daily. 01/28/24     torsemide  (DEMADEX ) 20 MG tablet Take 1 tablet (20 mg total) by mouth daily as needed. 03/27/23   Love, Sharlet RAMAN, PA-C  traZODone  (  DESYREL ) 50 MG tablet Take 1 tablet (50 mg total) by mouth at bedtime as needed for sleep. 12/31/23   Bensimhon, Toribio SAUNDERS, MD  warfarin (COUMADIN ) 2.5 MG tablet Take 2.5 mg (1 tablet) every Mon, Fri; 5 mg (2 tablets) all other days; or as directed by heart failure clinic 01/15/24   Bensimhon, Toribio SAUNDERS, MD    Current Facility-Administered Medications  Medication Dose Route Frequency Provider Last Rate Last Admin   acetaminophen  (TYLENOL ) tablet 650 mg  650 mg Oral Q4H PRN Lee, Jordan, NP       ondansetron  (ZOFRAN ) injection 4 mg  4 mg Intravenous Q6H PRN Lee, [place], NP        Allergies as of 02/12/2024   (No Known Allergies)    Social History   Socioeconomic History   Marital status: Single    Spouse name: Not on file   Number of children: 1   Years of education: Not on file   Highest education level: Bachelor's degree (e.g., BA, AB, BS)  Occupational History   Occupation: works on Systems analyst  Tobacco Use   Smoking status: Former    Current packs/day: 0.00    Types: Cigarettes, Cigars    Quit date: 2012    Years since quitting: 13.6    Passive exposure: Past   Smokeless tobacco: Never  Vaping Use   Vaping status: Never Used  Substance and Sexual Activity   Alcohol use: Yes    Alcohol/week: 2.0 - 3.0 standard drinks of alcohol    Types: 2 - 3 drink(s) per week   Drug use:  No   Sexual activity: Not Currently  Other Topics Concern   Not on file  Social History Narrative   Not on file   Social Drivers of Health   Financial Resource Strain: Medium Risk (06/26/2023)   Overall Financial Resource Strain (CARDIA)    Difficulty of Paying Living Expenses: Somewhat hard  Food Insecurity: No Food Insecurity (07/25/2023)   Hunger Vital Sign    Worried About Running Out of Food in the Last Year: Never true    Ran Out of Food in the Last Year: Never true  Transportation Needs: No Transportation Needs (07/25/2023)   PRAPARE - Administrator, Civil Service (Medical): No    Lack of Transportation (Non-Medical): No  Physical Activity: Not on file  Stress: Not on file  Social Connections: Unknown (11/08/2021)   Received from [facility]   Social Network    Social Network: Not on file  Intimate Partner Violence: Not At Risk (07/25/2023)   Humiliation, Afraid, Rape, and Kick questionnaire    Fear of Current or Ex-Partner: No    Emotionally Abused: No    Physically Abused: No    Sexually Abused: No     Code Status   Code Status: Partial Code  Review of Systems: All systems reviewed and negative except where noted in HPI.  Physical Exam: Vital signs in last 24 hours: Weight:  [68.7 kg] 68.7 kg (08/20 1000)    General:  Pleasant male in NAD Psych:  Cooperative. Normal mood and affect Eyes: Pupils equal Ears:  Normal auditory acuity Nose: No deformity, discharge or lesions Neck:  Supple, no masses felt Lungs:  Clear to auscultation.  Heart:  Regular rate, regular rhythm. LVAD hum Abdomen:  Soft, nondistended, nontender, active bowel sounds, no masses felt Rectal :  Deferred Msk: Symmetrical without gross deformities.  Neurologic:  Alert, oriented, grossly normal neurologically Extremities : No edema Skin:  Intact without significant lesions.    Intake/Output from previous day: No intake/output data recorded. Intake/Output this  shift:  No intake/output data recorded.   Vina Dasen, NP-C   02/13/2024, 11:33 AM  I personally saw the patient and performed a substantive portion of this encounter (>50% time spent), including a complete performance of at least one of the key components (MDM, Hx and/or Exam), in conjunction with the APP.  I agree with the APP's note, impression, and recommendations with additional input as follows.   68 year old male with history of A-fib, CAD s/p PCI, HFrEF (EF <20%) s/p LVAD, VT s/p ICD, LV thrombus on warfarin, CKD, prostate cancer presented with symptomatic anemia.  No overt GI blood loss.  Hemoglobin dropped from 10.1 2 weeks ago to 8.4 upon arrival.  INR was supratherapeutic at 3.9.  Iron studies are consistent with a combination of IDA and ACD.  Patient has never had an EGD to his knowledge and he thinks that his last colonoscopy was a long time ago.  Will need to wait for his INR to improve before considering any endoscopic evaluation.  Most likely will start off with an EGD but patient needs a colonoscopy at some point as well.  Estefana Kidney, MD

## 2024-02-14 DIAGNOSIS — D509 Iron deficiency anemia, unspecified: Secondary | ICD-10-CM

## 2024-02-14 DIAGNOSIS — R791 Abnormal coagulation profile: Secondary | ICD-10-CM

## 2024-02-14 DIAGNOSIS — D649 Anemia, unspecified: Secondary | ICD-10-CM | POA: Diagnosis not present

## 2024-02-14 DIAGNOSIS — D638 Anemia in other chronic diseases classified elsewhere: Secondary | ICD-10-CM

## 2024-02-14 DIAGNOSIS — I5042 Chronic combined systolic (congestive) and diastolic (congestive) heart failure: Secondary | ICD-10-CM | POA: Diagnosis not present

## 2024-02-14 DIAGNOSIS — Z95811 Presence of heart assist device: Secondary | ICD-10-CM | POA: Diagnosis not present

## 2024-02-14 LAB — BASIC METABOLIC PANEL WITH GFR
Anion gap: 8 (ref 5–15)
BUN: 18 mg/dL (ref 8–23)
CO2: 20 mmol/L — ABNORMAL LOW (ref 22–32)
Calcium: 8 mg/dL — ABNORMAL LOW (ref 8.9–10.3)
Chloride: 107 mmol/L (ref 98–111)
Creatinine, Ser: 1.21 mg/dL (ref 0.61–1.24)
GFR, Estimated: >60 mL/min (ref >60)
Glucose, Bld: 120 mg/dL — ABNORMAL HIGH (ref 70–99)
Potassium: 3.8 mmol/L (ref 3.5–5.1)
Sodium: 135 mmol/L (ref 135–145)

## 2024-02-14 LAB — URINALYSIS, ROUTINE W REFLEX MICROSCOPIC
Bacteria, UA: NONE SEEN
Bilirubin Urine: NEGATIVE
Glucose, UA: NEGATIVE mg/dL
Ketones, ur: NEGATIVE mg/dL
Leukocytes,Ua: NEGATIVE
Nitrite: NEGATIVE
Protein, ur: NEGATIVE mg/dL
Specific Gravity, Urine: 1.016 (ref 1.005–1.030)
pH: 5 (ref 5.0–8.0)

## 2024-02-14 LAB — CBC
HCT: 23.7 % — ABNORMAL LOW (ref 39.0–52.0)
Hemoglobin: 7.7 g/dL — ABNORMAL LOW (ref 13.0–17.0)
MCH: 31.4 pg (ref 26.0–34.0)
MCHC: 32.5 g/dL (ref 30.0–36.0)
MCV: 96.7 fL (ref 80.0–100.0)
Platelets: 268 K/uL (ref 150–400)
RBC: 2.45 MIL/uL — ABNORMAL LOW (ref 4.22–5.81)
RDW: 18.6 % — ABNORMAL HIGH (ref 11.5–15.5)
WBC: 9.7 K/uL (ref 4.0–10.5)
nRBC: 0 % (ref 0.0–0.2)

## 2024-02-14 LAB — PROTIME-INR
INR: 4.4 (ref 0.8–1.2)
INR: 2.2 — ABNORMAL HIGH (ref 0.8–1.2)
Prothrombin Time: 44.1 s — ABNORMAL HIGH (ref 11.4–15.2)
Prothrombin Time: 25.9 s — ABNORMAL HIGH (ref 11.4–15.2)

## 2024-02-14 LAB — LACTATE DEHYDROGENASE: LDH: 313 U/L — ABNORMAL HIGH (ref 98–192)

## 2024-02-14 LAB — PSA: Prostatic Specific Antigen: 0.26 ng/mL (ref 0.00–4.00)

## 2024-02-14 MED ORDER — SODIUM CHLORIDE 0.9% IV SOLUTION: Freq: Once | INTRAVENOUS | Status: DC

## 2024-02-14 MED ORDER — VITAMIN K1 10 MG/ML IJ SOLN
2.5000 mg | Freq: Once | INTRAVENOUS | Status: AC
  Administered 2024-02-14: 2.5 mg via INTRAVENOUS
  Filled 2024-02-14: qty 0.25

## 2024-02-14 NOTE — Progress Notes (Addendum)
 Advanced Heart Failure VAD Team Note  PCP-Cardiologist: Debby Sor, MD (Inactive)   Subjective:   Chief Complaint: Symptomatic anemia  INR 4.4 this morning, denies abnormal bleeding.  Fecal occult negative.  Hemoglobin 7.7.  Elevated vitamin B12  Feels fine this morning, he rather be at work.  LVAD INTERROGATION:  HeartMate III LVAD:   Flow 4.7 liters/min, speed 5600, power 4.2, PI 2.8.  No PI events today  Objective:    Vital Signs:   Temp:  [97.6 F (36.4 C)-99.4 F (37.4 C)] 98.3 F (36.8 C) (08/21 1104) Pulse Rate:  [65-90] 72 (08/21 1104) Resp:  [18-24] 18 (08/21 1104) BP: (80-120)/(58-81) 93/58 (08/21 1104) SpO2:  [97 %-100 %] 98 % (08/21 1104) Weight:  [66.8 kg] 66.8 kg (08/21 0553) Last BM Date : 02/13/24 Mean arterial Pressure 70s-80s  Intake/Output:   Intake/Output Summary (Last 24 hours) at 02/14/2024 1126 Last data filed at 02/14/2024 0833 Gross per 24 hour  Intake 240 ml  Output 500 ml  Net -260 ml     Physical Exam    General:  Well appearing. No resp difficulty Neck:  JVP ~6.  Cor: Mechanical heart sounds with LVAD hum present. Lungs: Clear Abdomen: soft, nontender, nondistended.  Driveline: C/D/I; securement device intact and driveline incorporated Extremities: no edema Neuro: alert & oriented x3. Affect pleasant   Telemetry   NSR 70s personally reviewed  Labs   Basic Metabolic Panel: Recent Labs  Lab 02/13/24 1050 02/14/24 0223  NA 134* 135  K 3.5 3.8  CL 106 107  CO2 20* 20*  GLUCOSE 111* 120*  BUN 19 18  CREATININE 1.39* 1.21  CALCIUM  8.5* 8.0*    Liver Function Tests: Recent Labs  Lab 02/13/24 1050  AST 63*  ALT 43  ALKPHOS 84  BILITOT 1.3*  PROT 6.8  ALBUMIN  2.8*   No results for input(s): LIPASE, AMYLASE in the last 168 hours. No results for input(s): AMMONIA in the last 168 hours.  CBC: Recent Labs  Lab 02/12/24 1122 02/13/24 1050 02/14/24 0223  WBC 15.6* 12.7* 9.7  HGB 8.4* 8.2* 7.7*  HCT  26.7* 25.4* 23.7*  MCV 96.4 96.6 96.7  PLT 310 295 268    INR: Recent Labs  Lab 02/12/24 1122 02/13/24 1050 02/14/24 0223  INR 3.9* 3.9* 4.4*    Other results: EKG:    Imaging   No results found.   Medications:     Scheduled Medications:  sodium chloride    Intravenous Once   atorvastatin   80 mg Oral Daily   docusate sodium   200 mg Oral Daily   finasteride   5 mg Oral Daily   pantoprazole   40 mg Oral BID    Infusions:  phytonadione  (VITAMIN K ) 2.5 mg in dextrose  5 % 50 mL IVPB      PRN Medications: acetaminophen , ondansetron  (ZOFRAN ) IV, traZODone    Patient Profile   Steven [name] is a 68 y.o. 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 Underwent HM-3 VAD placement on 03/08/23. Now admitted with symptomatic anemia.   Assessment/Plan:   Symptomatic Anemia - presented to VAD Clinic with orthostasis and lightheadedness after LOW flow alarm last week - Hgb on labs 8.4, now 8.2> 7.7 today.  - No obvious source of bleeding - fecal occult (-) and Fe studies stable, no indication for IV iron need at this time - Supra-therapeutic INR at 4.4.  Will give FFP x2 today.  - holding warfarin - consult to GI for possible AVM.  At this time no plan for intervention with elevated INR. - Continue protonix  40 mg bid for now   2. Chronic combined systolic and diastolic heart failure - Due to ICM with anterior MI in 2012 - Echo 02/27/23: EF <20%, LV with GHK, RV mildly reduced, GIIDD, LA mod dilated, mild MR.  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 GDMT limited by renal function and hypotension.  - S/P HM3 LVAD implant 03/08/23.   - Stable NYHA Class I. Appears dry.  - Hold diuretics. No SGLT2i with hypovolemia - Hold losartan . MAP in 70s-80s.   3. VAD  - Fixed speed 5600. Last RAMP 3/25. - Hgb as above.  - LDH ok, INR as above.  -  Driveline ok; MAP ok - INR 4.4. Goal 2.0-2.5. Hold warfarin. D/w PharmD. X2 FFP today   4. 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  - No s/s angina - off asa as above.  - Continue statin   5. CKD stage 3a: Creatinine baseline 1.1-1.3 - cardiorenal, resolved w/ LVAD support - sCr WNL today   6. H/o VT s/p Medtronic ICD: likely scar-mediated - No recent VT   6. Paroxysmal Atrial fibrillation:  - In NSR. Off amio. Holding warfarin.   I reviewed the LVAD parameters from today, and compared the results to the patient's prior recorded data.  No programming changes were made.  The LVAD is functioning within specified parameters.  The patient performs LVAD self-test daily.  LVAD interrogation was negative for any significant power changes, alarms or PI events/speed drops.  LVAD equipment check completed and is in good working order.  Back-up equipment present.   LVAD education done on emergency procedures and precautions and reviewed exit site care.  Length of Stay: 1  Beckey LITTIE Coe, NP 02/14/2024, 11:26 AM  VAD Team --- VAD ISSUES ONLY--- Pager [phone] (7am - 7am)  Advanced Heart Failure Team  Pager [phone] (M-F; 7a - 5p)  Please contact [facility] for night-coverage after hours (5p -7a ) and weekends on [email or web address]  Patient seen and examined with the above-signed Advanced Practice Provider and/or Housestaff. I personally reviewed laboratory data, imaging studies and relevant notes. I independently examined the patient and formulated the important aspects of the plan. I have edited the note to reflect any of my changes or salient points. I have personally discussed the plan with the patient and/or family.  Denies melena, BRBPR or ab pain. Hgb continue to drift down.  INR trending up   General:  NAD.  HEENT: normal  Neck: supple. JVP not elevated.  Carotids 2+ bilat; no bruits. No lymphadenopathy or thryomegaly appreciated. Cor: LVAD hum.  Lungs: Clear. Abdomen:  obese soft, nontender, non-distended. No hepatosplenomegaly. No bruits or masses. Good bowel sounds. Driveline site clean. Anchor in place.  Extremities: no cyanosis, clubbing, rash. Warm no edema  Neuro: alert & oriented x 3. No focal deficits. Moves all 4 without problem   Will reverse INR with low-dose vit K and FFP to help get him ready for endoscopy. Given lack of GI symptoms concern for underlying hematologic issue exists. Appreciate GI input. Will try to get INR ~2.0 for tomorrow.   VAD interrogated personally. Parameters stable.  Toribio Fuel, MD  6:33 PM

## 2024-02-14 NOTE — Plan of Care (Signed)
  Problem: Education: Goal: Knowledge of General Education information will improve Description: Including pain rating scale, medication(s)/side effects and non-pharmacologic comfort measures Outcome: Progressing   Problem: Health Behavior/Discharge Planning: Goal: Ability to manage health-related needs will improve Outcome: Progressing   Problem: Clinical Measurements: Goal: Ability to maintain clinical measurements within normal limits will improve Outcome: Progressing Goal: Will remain free from infection Outcome: Progressing Goal: Diagnostic test results will improve Outcome: Progressing Goal: Respiratory complications will improve Outcome: Progressing Goal: Cardiovascular complication will be avoided Outcome: Progressing   Problem: Activity: Goal: Risk for activity intolerance will decrease Outcome: Progressing   Problem: Nutrition: Goal: Adequate nutrition will be maintained Outcome: Progressing   Problem: Coping: Goal: Level of anxiety will decrease Outcome: Progressing   Problem: Elimination: Goal: Will not experience complications related to bowel motility Outcome: Progressing Goal: Will not experience complications related to urinary retention Outcome: Progressing   Problem: Pain Managment: Goal: General experience of comfort will improve and/or be controlled Outcome: Progressing   Problem: Safety: Goal: Ability to remain free from injury will improve Outcome: Progressing   Problem: Skin Integrity: Goal: Risk for impaired skin integrity will decrease Outcome: Progressing   Problem: Education: Goal: Patient will understand all VAD equipment and how it functions Outcome: Progressing Goal: Patient will be able to verbalize current INR target range and antiplatelet therapy for discharge home Outcome: Progressing   Problem: Cardiac: Goal: LVAD will function as expected and patient will experience no clinical alarms Outcome: Progressing

## 2024-02-14 NOTE — Progress Notes (Addendum)
 LVAD Coordinator Rounding Note:  Admitted 02/13/24 to HF service due to GIB.   HM3 LVAD implanted on 03/08/23 by Dr Lucas under DT criteria.  Pt presented to VAD clinic yesterday for driveline care and labs. Pt states that he has not being feeling well recently and reports lightheadedness at times along with fatigue. CBC resulted with 4 gram drop in hgb over the past few weeks. Decision made to direct admit pt for GI work up.  Pt lying in bed on my arrival. Pt tells me that he has been nauseous an unable to eat for about a week. He denies any blood in his stool and tells me that his stool is yellow. Fecal occult blood is negative. Denies any vomiting. He states that he has lost a few pounds because he is unable to eat.   D/w with NP, PSA ordered and UA due to pts hx of prostate cancer since fecal occult is negative. FFP ordered by provider team for INR of 4.4 today.  Vital signs: Temp: 99.4 HR: 79 paced Doppler Pressure: 78 Auto BP: 120/75 (88) O2 Sat: 100% on RA Wt: 151.4>147.2  lbs    LVAD interrogation reveals:  Speed: 5600 Flow: 4.7 Power: 4.7 w  PI: 3.7  Alarms: none  Events: 40+ PI events today Hematocrit: 20-DO NOT CHANGE  Fixed speed: 5600 Low speed limit: 5300   Drive Line: Existing VAD dressing CDI. Anchor secure. Next dressing change due 02/19/24 bedside nurse or VAD coordinator.  Labs:  LDH trend: 313  INR trend: 3.9>4.4  Hgb: 8.2>7.7  Anticoagulation Plan: -INR Goal: 2-2.5 -ASA Dose: off  Blood Products:   Device: -Medtronic -Therapies: ON  -VF > 200BMP  Arrythmias: hx afib  Infection:   Adverse Events on VAD:  Plan/Recommendations:  1. Please page VAD coordinator with any equipment issues or driveline problems. 2. Weekly dressing change by bedside nurse or VAD coordinator.   Lauraine Ip RN,BSN VAD Coordinator  Office: [phone]  24/7 Pager: [phone]

## 2024-02-14 NOTE — H&P (View-Only) (Signed)
    Gastroenterology Inpatient Follow Up    Subjective: Still has not seen any blood in the stools.   Objective: Vital signs in last 24 hours: Temp:  [97.6 F (36.4 C)-99.4 F (37.4 C)] 97.8 F (36.6 C) (08/21 1242) Pulse Rate:  [65-90] 71 (08/21 1242) Resp:  [17-24] 17 (08/21 1242) BP: (71-120)/(55-81) 77/63 (08/21 1242) SpO2:  [97 %-100 %] 98 % (08/21 1242) Weight:  [66.8 kg] 66.8 kg (08/21 0553) Last BM Date : 02/13/24  Intake/Output from previous day: 08/20 0701 - 08/21 0700 In: -  Out: 500 [Urine:500] Intake/Output this shift: Total I/O In: 480 [P.O.:480] Out: -   General appearance: alert and cooperative Resp: no increased WOB Cardio: regular rate GI: non-tender, non-distended  Lab Results: Recent Labs    02/12/24 1122 02/13/24 1050 02/14/24 0223  WBC 15.6* 12.7* 9.7  HGB 8.4* 8.2* 7.7*  HCT 26.7* 25.4* 23.7*  PLT 310 295 268   BMET Recent Labs    02/13/24 1050 02/14/24 0223  NA 134* 135  K 3.5 3.8  CL 106 107  CO2 20* 20*  GLUCOSE 111* 120*  BUN 19 18  CREATININE 1.39* 1.21  CALCIUM  8.5* 8.0*   LFT Recent Labs    02/13/24 1050  PROT 6.8  ALBUMIN  2.8*  AST 63*  ALT 43  ALKPHOS 84  BILITOT 1.3*   PT/INR Recent Labs    02/13/24 1050 02/14/24 0223  LABPROT 40.3* 44.1*  INR 3.9* 4.4*   Hepatitis Panel No results for input(s): HEPBSAG, HCVAB, HEPAIGM, HEPBIGM in the last 72 hours. C-Diff No results for input(s): CDIFFTOX in the last 72 hours.  Studies/Results: No results found.  Medications: I have reviewed the patient's current medications. Scheduled:  sodium chloride    Intravenous Once   atorvastatin   80 mg Oral Daily   docusate sodium   200 mg Oral Daily   finasteride   5 mg Oral Daily   pantoprazole   40 mg Oral BID   Continuous:  phytonadione  (VITAMIN K ) 2.5 mg in dextrose  5 % 50 mL IVPB     PRN:acetaminophen , ondansetron  (ZOFRAN ) IV, traZODone   Assessment/Plan: 68 year old male with history of A-fib,  CAD s/p PCI, HFrEF (EF <20%) s/p LVAD (Goal INR is 2-2.5), VT s/p ICD, LV thrombus on warfarin, CKD, prostate cancer presented with symptomatic anemia.  No overt GI blood loss.  Hemoglobin dropped from 10.1 2 weeks ago to 8.4 upon arrival.  INR was supratherapeutic at 3.9.  Iron studies are consistent with a combination of IDA and ACD.  Patient has never had an EGD to his knowledge and he thinks that his last colonoscopy was a long time ago.  Still waiting on INR to come down, appears it increased today to 4.4. Spoke to heart failure team and they are working on getting his INR lower. Hb has downtrended slightly from 8.2 yesterday to 7.7 today.  - Continue to trend INR - EGD +/- colonoscopy, hopefully when INR is in the 2-2.2. range.   LOS: 1 day   Rosario JAYSON Kidney 02/14/2024, 1:56 PM

## 2024-02-14 NOTE — Progress Notes (Signed)
    Gastroenterology Inpatient Follow Up    Subjective: Still has not seen any blood in the stools.   Objective: Vital signs in last 24 hours: Temp:  [97.6 F (36.4 C)-99.4 F (37.4 C)] 97.8 F (36.6 C) (08/21 1242) Pulse Rate:  [65-90] 71 (08/21 1242) Resp:  [17-24] 17 (08/21 1242) BP: (71-120)/(55-81) 77/63 (08/21 1242) SpO2:  [97 %-100 %] 98 % (08/21 1242) Weight:  [66.8 kg] 66.8 kg (08/21 0553) Last BM Date : 02/13/24  Intake/Output from previous day: 08/20 0701 - 08/21 0700 In: -  Out: 500 [Urine:500] Intake/Output this shift: Total I/O In: 480 [P.O.:480] Out: -   General appearance: alert and cooperative Resp: no increased WOB Cardio: regular rate GI: non-tender, non-distended  Lab Results: Recent Labs    02/12/24 1122 02/13/24 1050 02/14/24 0223  WBC 15.6* 12.7* 9.7  HGB 8.4* 8.2* 7.7*  HCT 26.7* 25.4* 23.7*  PLT 310 295 268   BMET Recent Labs    02/13/24 1050 02/14/24 0223  NA 134* 135  K 3.5 3.8  CL 106 107  CO2 20* 20*  GLUCOSE 111* 120*  BUN 19 18  CREATININE 1.39* 1.21  CALCIUM  8.5* 8.0*   LFT Recent Labs    02/13/24 1050  PROT 6.8  ALBUMIN  2.8*  AST 63*  ALT 43  ALKPHOS 84  BILITOT 1.3*   PT/INR Recent Labs    02/13/24 1050 02/14/24 0223  LABPROT 40.3* 44.1*  INR 3.9* 4.4*   Hepatitis Panel No results for input(s): HEPBSAG, HCVAB, HEPAIGM, HEPBIGM in the last 72 hours. C-Diff No results for input(s): CDIFFTOX in the last 72 hours.  Studies/Results: No results found.  Medications: I have reviewed the patient's current medications. Scheduled:  sodium chloride    Intravenous Once   atorvastatin   80 mg Oral Daily   docusate sodium   200 mg Oral Daily   finasteride   5 mg Oral Daily   pantoprazole   40 mg Oral BID   Continuous:  phytonadione  (VITAMIN K ) 2.5 mg in dextrose  5 % 50 mL IVPB     PRN:acetaminophen , ondansetron  (ZOFRAN ) IV, traZODone   Assessment/Plan: 68 year old male with history of A-fib,  CAD s/p PCI, HFrEF (EF <20%) s/p LVAD (Goal INR is 2-2.5), VT s/p ICD, LV thrombus on warfarin, CKD, prostate cancer presented with symptomatic anemia.  No overt GI blood loss.  Hemoglobin dropped from 10.1 2 weeks ago to 8.4 upon arrival.  INR was supratherapeutic at 3.9.  Iron studies are consistent with a combination of IDA and ACD.  Patient has never had an EGD to his knowledge and he thinks that his last colonoscopy was a long time ago.  Still waiting on INR to come down, appears it increased today to 4.4. Spoke to heart failure team and they are working on getting his INR lower. Hb has downtrended slightly from 8.2 yesterday to 7.7 today.  - Continue to trend INR - EGD +/- colonoscopy, hopefully when INR is in the 2-2.2. range.   LOS: 1 day   Steven [name] Kidney 02/14/2024, 1:56 PM

## 2024-02-14 NOTE — TOC Initial Note (Signed)
 Transition of Care [facility]) - Initial/Assessment Note    Patient Details  Name: Steven [name] MRN: [id number] Date of Birth: January 14, 1956  Transition of Care [facility] CM/SW Contact:    Justina Delcia Czar, RN Phone Number: [phone]  02/14/2024, 5:35 PM  Clinical Narrative:                 Spoke to pt and states he lives alone. Pt states he is independent pta. Has scale at home for daily weights. Drives to appts.   Will continue to follow for dc needs.   Expected Discharge Plan: Home/Self Care Barriers to Discharge: Continued Medical Work up   Patient Goals and CMS Choice Patient states their goals for this hospitalization and ongoing recovery are:: wants to get better          Expected Discharge Plan and Services   Discharge Planning Services: CM Consult   Living arrangements for the past 2 months: Single Family Home                                      Prior Living Arrangements/Services Living arrangements for the past 2 months: Single Family Home Lives with:: Self Patient language and need for interpreter reviewed:: Yes Do you feel safe going back to the place where you live?: Yes      Need for Family Participation in Patient Care: No (Comment) Care giver support system in place?: No (comment)   Criminal Activity/Legal Involvement Pertinent to Current Situation/Hospitalization: No - Comment as needed  Activities of Daily Living   ADL Screening (condition at time of admission) Independently performs ADLs?: Yes (appropriate for developmental age) Is the patient deaf or have difficulty hearing?: No Does the patient have difficulty seeing, even when wearing glasses/contacts?: No Does the patient have difficulty concentrating, remembering, or making decisions?: No  Permission Sought/Granted Permission sought to share information with : Case Manager, Family Supports, PCP Permission granted to share information with : Yes, Verbal Permission Granted  Share  Information with NAME: Risa Pitstick  Permission granted to share info w AGENCY: PCP, DME     Permission granted to share info w Contact Information: [phone]  Emotional Assessment Appearance:: Appears stated age Attitude/Demeanor/Rapport: Engaged Affect (typically observed): Accepting Orientation: : Oriented to Self, Oriented to Place, Oriented to  Time, Oriented to Situation   Psych Involvement: No (comment)  Admission diagnosis:  Symptomatic anemia [D64.9] Patient Active Problem List   Diagnosis Date Noted   Symptomatic anemia 02/13/2024   Supratherapeutic INR 02/13/2024   LVAD (left ventricular assist device) present (HCC) 02/13/2024   Fever 12/26/2023   Leg edema, left 12/26/2023   Hyponatremia 03/26/2023   Prediabetes 03/22/2023   Acute on chronic combined systolic and diastolic heart failure (HCC) 03/22/2023   Debility 03/20/2023   Thyroid  cyst 02/03/2022   Hypertension 08/08/2021   Alcohol abuse 08/08/2021   Goals of care, counseling/discussion 06/21/2021   Malignant neoplasm of prostate (HCC) 05/17/2021   Stage 3a chronic kidney disease (HCC) 11/20/2018   S/P subdural hematoma evacuation 03/04/2014   Long term current use of anticoagulant therapy 08/25/2013   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 [place] [address] Phone: [phone] Fax: [phone]     Social Drivers of Health (SDOH) Social History: SDOH Screenings   Food Insecurity: No Food Insecurity (02/13/2024)  Housing: Low Risk  (02/13/2024)  Transportation Needs: No Transportation Needs (02/13/2024)  Utilities: Not At Risk (02/13/2024)  Alcohol Screen: Low  Risk  (05/08/2022)  Depression (PHQ2-9): Low Risk  (01/24/2024)  Financial Resource Strain: Medium Risk (06/26/2023)  Social Connections: Socially Isolated (02/13/2024)  Tobacco Use: Medium Risk (02/13/2024)  Health Literacy: Adequate Health Literacy (07/25/2023)   SDOH Interventions:     Readmission Risk Interventions     No data to display

## 2024-02-15 ENCOUNTER — Inpatient Hospital Stay (HOSPITAL_COMMUNITY): Admitting: Registered Nurse

## 2024-02-15 ENCOUNTER — Encounter (HOSPITAL_COMMUNITY): Payer: Self-pay | Admitting: Internal Medicine

## 2024-02-15 ENCOUNTER — Encounter (HOSPITAL_COMMUNITY)
Admission: AD | Disposition: A | Discharge status: Home / Self Care | Payer: Self-pay | Source: Ambulatory Visit | Attending: Internal Medicine

## 2024-02-15 DIAGNOSIS — K449 Diaphragmatic hernia without obstruction or gangrene: Secondary | ICD-10-CM

## 2024-02-15 DIAGNOSIS — I2510 Atherosclerotic heart disease of native coronary artery without angina pectoris: Secondary | ICD-10-CM

## 2024-02-15 DIAGNOSIS — D649 Anemia, unspecified: Secondary | ICD-10-CM | POA: Diagnosis not present

## 2024-02-15 DIAGNOSIS — Z87891 Personal history of nicotine dependence: Secondary | ICD-10-CM

## 2024-02-15 DIAGNOSIS — K2970 Gastritis, unspecified, without bleeding: Secondary | ICD-10-CM

## 2024-02-15 DIAGNOSIS — Z95811 Presence of heart assist device: Secondary | ICD-10-CM | POA: Diagnosis not present

## 2024-02-15 DIAGNOSIS — D509 Iron deficiency anemia, unspecified: Secondary | ICD-10-CM | POA: Diagnosis not present

## 2024-02-15 DIAGNOSIS — I5042 Chronic combined systolic (congestive) and diastolic (congestive) heart failure: Secondary | ICD-10-CM | POA: Diagnosis not present

## 2024-02-15 HISTORY — PX: ESOPHAGOGASTRODUODENOSCOPY: SHX5428

## 2024-02-15 LAB — BPAM FFP
Blood Product Expiration Date: 202508262359
Blood Product Expiration Date: 202508262359
ISSUE DATE / TIME: 202508211220
ISSUE DATE / TIME: 202508211537
Unit Type and Rh: 2800
Unit Type and Rh: 8400

## 2024-02-15 LAB — CBC
HCT: 26.7 % — ABNORMAL LOW (ref 39.0–52.0)
Hemoglobin: 8.5 g/dL — ABNORMAL LOW (ref 13.0–17.0)
MCH: 31.4 pg (ref 26.0–34.0)
MCHC: 31.8 g/dL (ref 30.0–36.0)
MCV: 98.5 fL (ref 80.0–100.0)
Platelets: 294 K/uL (ref 150–400)
RBC: 2.71 MIL/uL — ABNORMAL LOW (ref 4.22–5.81)
RDW: 18.4 % — ABNORMAL HIGH (ref 11.5–15.5)
WBC: 10.9 K/uL — ABNORMAL HIGH (ref 4.0–10.5)
nRBC: 0 % (ref 0.0–0.2)

## 2024-02-15 LAB — PROTIME-INR
INR: 1.8 — ABNORMAL HIGH (ref 0.8–1.2)
Prothrombin Time: 21.3 s — ABNORMAL HIGH (ref 11.4–15.2)

## 2024-02-15 LAB — PREPARE FRESH FROZEN PLASMA: Unit division: 0

## 2024-02-15 LAB — LACTATE DEHYDROGENASE: LDH: 318 U/L — ABNORMAL HIGH (ref 98–192)

## 2024-02-15 LAB — BASIC METABOLIC PANEL WITH GFR
Anion gap: 8 (ref 5–15)
BUN: 16 mg/dL (ref 8–23)
CO2: 20 mmol/L — ABNORMAL LOW (ref 22–32)
Calcium: 8.6 mg/dL — ABNORMAL LOW (ref 8.9–10.3)
Chloride: 105 mmol/L (ref 98–111)
Creatinine, Ser: 1.01 mg/dL (ref 0.61–1.24)
GFR, Estimated: >60 mL/min (ref >60)
Glucose, Bld: 99 mg/dL (ref 70–99)
Potassium: 3.9 mmol/L (ref 3.5–5.1)
Sodium: 133 mmol/L — ABNORMAL LOW (ref 135–145)

## 2024-02-15 SURGERY — EGD (ESOPHAGOGASTRODUODENOSCOPY)
Anesthesia: Monitor Anesthesia Care

## 2024-02-15 MED ORDER — ENSURE PLUS HIGH PROTEIN PO LIQD
237.0000 mL | Freq: Two times a day (BID) | ORAL | Status: DC
  Administered 2024-02-15 – 2024-02-18 (×5): 237 mL via ORAL

## 2024-02-15 MED ORDER — BISACODYL 5 MG PO TBEC
10.0000 mg | DELAYED_RELEASE_TABLET | Freq: Once | ORAL | Status: AC
  Administered 2024-02-15: 10 mg via ORAL
  Filled 2024-02-15: qty 2

## 2024-02-15 MED ORDER — ALBUMIN HUMAN 5 % IV SOLN
INTRAVENOUS | Status: DC | PRN
Start: 2024-02-15 — End: 2024-02-15

## 2024-02-15 MED ORDER — SODIUM CHLORIDE 0.9 % IV SOLN: INTRAVENOUS | Status: AC

## 2024-02-15 MED ORDER — NA SULFATE-K SULFATE-MG SULF 17.5-3.13-1.6 GM/177ML PO SOLN
0.5000 | Freq: Once | ORAL | Status: AC
  Administered 2024-02-16: 177 mL via ORAL
  Filled 2024-02-15: qty 1

## 2024-02-15 MED ORDER — LIDOCAINE 2% (20 MG/ML) 5 ML SYRINGE
INTRAMUSCULAR | Status: DC | PRN
  Administered 2024-02-15: 60 mg via INTRAVENOUS

## 2024-02-15 MED ORDER — PROPOFOL 500 MG/50ML IV EMUL
INTRAVENOUS | Status: DC | PRN
  Administered 2024-02-15: 125 ug/kg/min via INTRAVENOUS
  Administered 2024-02-15: 20 mg via INTRAVENOUS
  Administered 2024-02-15: 30 mg via INTRAVENOUS

## 2024-02-15 MED ORDER — NA SULFATE-K SULFATE-MG SULF 17.5-3.13-1.6 GM/177ML PO SOLN
0.5000 | Freq: Once | ORAL | Status: AC
  Administered 2024-02-15: 177 mL via ORAL
  Filled 2024-02-15: qty 1

## 2024-02-15 MED ORDER — SODIUM CHLORIDE 0.9 % IV SOLN: INTRAVENOUS | Status: DC

## 2024-02-15 MED ORDER — PHENYLEPHRINE HCL-NACL 20-0.9 MG/250ML-% IV SOLN
INTRAVENOUS | Status: DC | PRN
  Administered 2024-02-15: 40 ug/min via INTRAVENOUS

## 2024-02-15 MED ORDER — SODIUM CHLORIDE 0.9 % IV SOLN
INTRAVENOUS | Status: AC | PRN
  Administered 2024-02-15: 500 mL via INTRAVENOUS

## 2024-02-15 NOTE — Plan of Care (Signed)
  Problem: Education: Goal: Knowledge of General Education information will improve Description: Including pain rating scale, medication(s)/side effects and non-pharmacologic comfort measures Outcome: Progressing   Problem: Health Behavior/Discharge Planning: Goal: Ability to manage health-related needs will improve Outcome: Progressing   Problem: Clinical Measurements: Goal: Ability to maintain clinical measurements within normal limits will improve Outcome: Progressing Goal: Will remain free from infection Outcome: Progressing Goal: Diagnostic test results will improve Outcome: Progressing Goal: Respiratory complications will improve Outcome: Progressing Goal: Cardiovascular complication will be avoided Outcome: Progressing   Problem: Activity: Goal: Risk for activity intolerance will decrease Outcome: Progressing   Problem: Nutrition: Goal: Adequate nutrition will be maintained Outcome: Progressing   Problem: Coping: Goal: Level of anxiety will decrease Outcome: Progressing   Problem: Elimination: Goal: Will not experience complications related to bowel motility Outcome: Progressing Goal: Will not experience complications related to urinary retention Outcome: Progressing   Problem: Pain Managment: Goal: General experience of comfort will improve and/or be controlled Outcome: Progressing   Problem: Safety: Goal: Ability to remain free from injury will improve Outcome: Progressing   Problem: Skin Integrity: Goal: Risk for impaired skin integrity will decrease Outcome: Progressing   Problem: Education: Goal: Patient will understand all VAD equipment and how it functions Outcome: Progressing Goal: Patient will be able to verbalize current INR target range and antiplatelet therapy for discharge home Outcome: Progressing   Problem: Cardiac: Goal: LVAD will function as expected and patient will experience no clinical alarms Outcome: Progressing

## 2024-02-15 NOTE — Progress Notes (Signed)
 LVAD Coordinator Rounding Note:  Admitted 02/13/24 to HF service due to GIB.   HM3 LVAD implanted on 03/08/23 by Dr Lucas under DT criteria.  Pt presented to VAD clinic yesterday for driveline care and labs. Pt states that he has not being feeling well recently (nausea & poor appetite) and reports lightheadedness at times along with fatigue. CBC resulted with 4 gram drop in hgb over the past few weeks. Decision made to direct admit pt for GI work up. Stool is yellow. Fecal occult negative.    Pt lying in bed on my arrival. Denies complaints this morning.   Received Vit K 2.5 mg and 1 FFP yesterday for INR 4.4. INR 1.8 this morning. Plan for EGD today. See separate note for documentation.   Pt will need note for work at discharge.   Vital signs: Temp: 98.7 HR: 70 paced Doppler Pressure: 64 Auto BP: 88/57 (69) O2 Sat: 100% on RA Wt: 151.4>147.2>147.7  lbs    LVAD interrogation reveals:  Speed: 5600 Flow: 4.6 Power: 4.2 w  PI: 3.2  Alarms: none  Events: 5 PI events today Hematocrit: 20-DO NOT CHANGE  Fixed speed: 5600 Low speed limit: 5300   Drive Line: Existing VAD dressing CDI. Anchor secure. Next dressing change due 02/19/24 bedside nurse or VAD coordinator.  Labs:  LDH trend: 313>318  INR trend: 3.9>4.4>1.8  Hgb: 8.2>7.7>8.5  Anticoagulation Plan: -INR Goal: 2-2.5 -ASA Dose: off  Blood Products:   Device: -Medtronic -Therapies: ON  -VF > 200BMP  Arrythmias: hx afib  Infection:   Adverse Events on VAD:  Plan/Recommendations:  1. Please page VAD coordinator with any equipment issues or driveline problems. 2. Weekly dressing change by bedside nurse or VAD coordinator.  Isaiah Knoll RN VAD Coordinator  Office: [phone]  24/7 Pager: [phone]

## 2024-02-15 NOTE — Op Note (Addendum)
 [facility] Patient Name: Steven [name] Procedure Date : 02/15/2024 MRN: [id number] Attending MD: Rosario Estefana Kidney , , [id number] Date of Birth: [birth year] CSN: [id number] Age: 68 Admit Type: Inpatient Procedure:                Upper GI endoscopy Indications:              Anemia Providers:                Rosario Estefana Kidney, Darleene Bare, RN, Coye Bade, Technician Referring MD:             Toribio SAUNDERS. Bensimhom, MD Medicines:                Monitored Anesthesia Care Complications:            No immediate complications. Estimated Blood Loss:     Estimated blood loss was minimal. Procedure:                Pre-Anesthesia Assessment:                           - Prior to the procedure, a History and Physical                            was performed, and patient medications and                            allergies were reviewed. The patient's tolerance of                            previous anesthesia was also reviewed. The risks                            and benefits of the procedure and the sedation                            options and risks were discussed with the patient.                            All questions were answered, and informed consent                            was obtained. Prior Anticoagulants: The patient has                            taken Coumadin  (warfarin), last dose was 3 days                            prior to procedure. ASA Grade Assessment: III - A                            patient with severe systemic disease. After  reviewing the risks and benefits, the patient was                            deemed in satisfactory condition to undergo the                            procedure.                           After obtaining informed consent, the endoscope was                            passed under direct vision. Throughout the                            procedure, the patient's blood  pressure, pulse, and                            oxygen saturations were monitored continuously. The                            [id number]-[id number] ([id number]) Olympus endoscope was introduced                            through the mouth, and advanced to the second part                            of duodenum. The upper GI endoscopy was                            accomplished without difficulty. The patient                            tolerated the procedure well. Scope In: Scope Out: Findings:      The examined esophagus was normal.      A small hiatal hernia was present.      Localized inflammation characterized by congestion (edema), erosions and       erythema was found in the gastric antrum. Biopsies were taken with a       cold forceps for histology.      The examined duodenum was normal. Impression:               - Normal esophagus.                           - Small hiatal hernia.                           - Gastritis. Biopsied.                           - Normal examined duodenum. Recommendation:           - Return patient to hospital ward for ongoing care.                           -  Await pathology results.                           - PPI BID for 8 weeks, then QD.                           - Colonoscopy tomorrow if patient is agreeable.                           - The findings and recommendations were discussed                            with the patient. Procedure Code(s):        --- Professional ---                           [id number], Esophagogastroduodenoscopy, flexible,                            transoral; with biopsy, single or multiple Diagnosis Code(s):        --- Professional ---                           K29.70, Gastritis, unspecified, without bleeding                           D64.9, Anemia, unspecified CPT copyright 2022 American Medical Association. All rights reserved. The codes documented in this report are preliminary and upon coder review may  be revised to meet current  compliance requirements. Dr Estefana Federico Rosario Estefana Federico,  02/15/2024 12:50:44 PM Number of Addenda: 0

## 2024-02-15 NOTE — Interval H&P Note (Signed)
 History and Physical Interval Note:  02/15/2024 11:44 AM  Steven [name]  has presented today for surgery, with the diagnosis of Anemia.  The various methods of treatment have been discussed with the patient and family. After consideration of risks, benefits and other options for treatment, the patient has consented to  Procedure(s): EGD (ESOPHAGOGASTRODUODENOSCOPY) (N/A) as a surgical intervention.  The patient's history has been reviewed, patient examined, no change in status, stable for surgery.  I have reviewed the patient's chart and labs.  Questions were answered to the patient's satisfaction.     Mykia Holton C Sly Parlee

## 2024-02-15 NOTE — Anesthesia Preprocedure Evaluation (Signed)
 Anesthesia Evaluation  Patient identified by MRN, date of birth, ID band Patient awake    Reviewed: Allergy & Precautions, H&P , NPO status , Patient's Chart, lab work & pertinent test results  Airway Mallampati: II   Neck ROM: full    Dental   Pulmonary former smoker   breath sounds clear to auscultation       Cardiovascular hypertension, + CAD, + Past MI and +CHF  + Cardiac Defibrillator  Rhythm:regular Rate:Normal  LVAD placed 02/2023.   Neuro/Psych    GI/Hepatic GI bleeding   Endo/Other    Renal/GU      Musculoskeletal   Abdominal   Peds  Hematology  (+) Blood dyscrasia, anemia Hemoglobin 8.5   Anesthesia Other Findings   Reproductive/Obstetrics                              Anesthesia Physical Anesthesia Plan  ASA: 4  Anesthesia Plan: MAC   Post-op Pain Management:    Induction: Intravenous  PONV Risk Score and Plan: 1 and Propofol  infusion and Treatment may vary due to age or medical condition  Airway Management Planned: Nasal Cannula  Additional Equipment:   Intra-op Plan:   Post-operative Plan:   Informed Consent: I have reviewed the patients History and Physical, chart, labs and discussed the procedure including the risks, benefits and alternatives for the proposed anesthesia with the patient or authorized representative who has indicated his/her understanding and acceptance.     Dental advisory given  Plan Discussed with: CRNA, Anesthesiologist and Surgeon  Anesthesia Plan Comments:         Anesthesia Quick Evaluation

## 2024-02-15 NOTE — Progress Notes (Signed)
 VAD Coordinator Procedure Note:   VAD Coordinator met patient in endoscopy Pt undergoing upper endoscopy per Dr. Federico. Hemodynamics and VAD parameters monitored by myself and anesthesia throughout the procedure. Blood pressures were obtained with automatic cuff on right arm. BP maintained with Neo drip and 1 albumin .     Time: Doppler Auto  BP Flow PI Power Speed  Pre-procedure:  1210  88/72 (78) 4.4 3.3 4.2 5600                    Sedation Induction: 1221  89/66 (73) 4.8 2.2 4.2 5600   1230  102/80 (88) 4.5 3.1 4.0 5600                    Recovery Area: 1240  78/61 (69) 4.5 3.5 4.1 5600   1250  82/70 (76) 4.8 2.1 4.2 5600   1300  83/69 (75) 4.7 2.2 4.2 5600    Patient Disposition: Patient tolerated the procedure well. VAD Coordinator accompanied and remained with patient in recovery area.   No source of bleeding found during case. Pt has hiatal hernia, and mild gastritis. Gastric biopsy sent to r/o H-pylori. Please see Dr Federico procedure note for further details. Per Dr Federico will plan for colonoscopy tomorrow.   INR 1.8 today. No Coumadin  tonight. If pt requires Heparin  drip, drip must be turned off 4-6 hrs prior to procedure. Dr Bensimhon and Beckey Coe NP aware of plan. Will not start Heparin  drip at this time per [facility] NP.   Steven Knoll RN VAD Coordinator  Office: [phone]  24/7 Pager: [phone]

## 2024-02-15 NOTE — Transfer of Care (Signed)
 Immediate Anesthesia Transfer of Care Note  Patient: Steven [name]  Procedure(s) Performed: EGD (ESOPHAGOGASTRODUODENOSCOPY)  Patient Location: PACU and Endoscopy Unit  Anesthesia Type:MAC  Level of Consciousness: awake, oriented, drowsy, and patient cooperative  Airway & Oxygen Therapy: Patient Spontanous Breathing  Post-op Assessment: Report given to RN, Post -op Vital signs reviewed and stable, and Patient moving all extremities X 4  Post vital signs: Reviewed and stable  Last Vitals:  Vitals Value Taken Time  BP 78/61 1245  Temp    Pulse 70 1245  Resp 12 1245  SpO2 100 1245    Last Pain:  Vitals:   02/15/24 1238  TempSrc: (P) Temporal  PainSc:          Complications: There were no known notable events for this encounter.

## 2024-02-15 NOTE — Progress Notes (Addendum)
 Advanced Heart Failure VAD Team Note  PCP-Cardiologist: Debby Sor, MD (Inactive)  Chief Complaint: Symptomatic anemia Subjective:    INR down to 1.8 this morning. S/p vit K and x2 FFP yesterday. Plan for EGD +/- colonoscopy today.   Feels fine this morning.   LVAD INTERROGATION:  HeartMate III LVAD:   Flow 4.8 liters/min, speed 5600, power 4.2, PI 3.4.  x5 PI events today  Objective:    Vital Signs:   Temp:  [97.8 F (36.6 C)-99.7 F (37.6 C)] 98.7 F (37.1 C) (08/22 0738) Pulse Rate:  [67-75] 67 (08/22 0738) Resp:  [17-20] 19 (08/22 0632) BP: (71-93)/(55-77) 73/62 (08/22 0800) SpO2:  [91 %-100 %] 91 % (08/22 0632) Weight:  [67 kg] 67 kg (08/22 0632) Last BM Date : 02/13/24 Mean arterial Pressure 60s-70s  Intake/Output:   Intake/Output Summary (Last 24 hours) at 02/15/2024 0941 Last data filed at 02/15/2024 0643 Gross per 24 hour  Intake 1108.42 ml  Output 1450 ml  Net -341.58 ml     Physical Exam  General:  Well appearing. No resp difficulty Neck:  JVP ~6.  Cor: Mechanical heart sounds with LVAD hum present. Lungs: Clear Abdomen: soft, nontender, nondistended.  Driveline: C/D/I; securement device intact and driveline incorporated Extremities: no edema Neuro: alert & oriented x3. Affect pleasant   Telemetry   NSR 60s-70s personally reviewed  Labs   Basic Metabolic Panel: Recent Labs  Lab 02/13/24 1050 02/14/24 0223 02/15/24 0727  NA 134* 135 133*  K 3.5 3.8 3.9  CL 106 107 105  CO2 20* 20* 20*  GLUCOSE 111* 120* 99  BUN 19 18 16   CREATININE 1.39* 1.21 1.01  CALCIUM  8.5* 8.0* 8.6*    Liver Function Tests: Recent Labs  Lab 02/13/24 1050  AST 63*  ALT 43  ALKPHOS 84  BILITOT 1.3*  PROT 6.8  ALBUMIN  2.8*   No results for input(s): LIPASE, AMYLASE in the last 168 hours. No results for input(s): AMMONIA in the last 168 hours.  CBC: Recent Labs  Lab 02/12/24 1122 02/13/24 1050 02/14/24 0223 02/15/24 0727  WBC 15.6* 12.7*  9.7 10.9*  HGB 8.4* 8.2* 7.7* 8.5*  HCT 26.7* 25.4* 23.7* 26.7*  MCV 96.4 96.6 96.7 98.5  PLT 310 295 268 294    INR: Recent Labs  Lab 02/12/24 1122 02/13/24 1050 02/14/24 0223 02/14/24 2100 02/15/24 0727  INR 3.9* 3.9* 4.4* 2.2* 1.8*    Other results: EKG:    Imaging   No results found.   Medications:     Scheduled Medications:  sodium chloride    Intravenous Once   atorvastatin   80 mg Oral Daily   docusate sodium   200 mg Oral Daily   finasteride   5 mg Oral Daily   pantoprazole   40 mg Oral BID    Infusions:  sodium chloride  20 mL/hr at 02/15/24 0935    PRN Medications: acetaminophen , ondansetron  (ZOFRAN ) IV, traZODone    Patient Profile   Steven [name] is a 68 y.o. 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 Underwent HM-3 VAD placement on 03/08/23. Now admitted with symptomatic anemia.   Assessment/Plan:   Symptomatic Anemia - presented to VAD Clinic with orthostasis and lightheadedness after LOW flow alarm last week - Hgb on labs 8.4, now 8.2> 7.7 today.  - No obvious source of bleeding - fecal occult (-) and Fe studies stable, no indication for  IV iron need at this time - Initially with supra-therapeutic INR at 4.4. Down to 1.8 this morning, s/p vit K and x2 FFP. - holding warfarin - GI following. Plan for EGD +/- colonoscopy today. - Continue protonix  40 mg bid for now   2. Chronic combined systolic and diastolic heart failure - Due to ICM with anterior MI in 2012 - Echo 02/27/23: EF <20%, LV with GHK, RV mildly reduced, GIIDD, LA mod dilated, mild MR.  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 GDMT limited by renal function and hypotension.  - S/P HM3 LVAD implant 03/08/23.   - Stable NYHA Class I. Appears dry.  - Hold diuretics. No SGLT2i with hypovolemia - Hold losartan . MAP in 60s-70s.   3. VAD  - Fixed speed  5600. Last RAMP 3/25. - Hgb as above.  - LDH ok, INR as above.  - Driveline ok; MAP ok - INR down to 1.8, s/p vit K and x2 FFP. Goal 2.0-2.5. Holding warfarin. D/w PharmD.    4. 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  - No s/s angina - off asa as above.  - Continue statin   5. CKD stage 3a: Creatinine baseline 1.1-1.3 - cardiorenal, resolved w/ LVAD support - sCr WNL today   6. H/o VT s/p Medtronic ICD: likely scar-mediated - No recent VT   6. Paroxysmal Atrial fibrillation:  - In NSR. Off amio. Holding warfarin.   I reviewed the LVAD parameters from today, and compared the results to the patient's prior recorded data.  No programming changes were made.  The LVAD is functioning within specified parameters.  The patient performs LVAD self-test daily.  LVAD interrogation was negative for any significant power changes, alarms or PI events/speed drops.  LVAD equipment check completed and is in good working order.  Back-up equipment present.   LVAD education done on emergency procedures and precautions and reviewed exit site care.  Length of Stay: 2  Beckey LITTIE Coe, NP 02/15/2024, 9:41 AM  VAD Team --- VAD ISSUES ONLY--- Pager [phone] (7am - 7am)  Advanced Heart Failure Team  Pager [phone] (M-F; 7a - 5p)  Please contact [facility] for night-coverage after hours (5p -7a ) and weekends on [email or web address]  Patient seen and examined with the above-signed Advanced Practice Provider and/or Housestaff. I personally reviewed laboratory data, imaging studies and relevant notes. I independently examined the patient and formulated the important aspects of the plan. I have edited the note to reflect any of my changes or salient points. I have personally discussed the plan with the patient and/or family.  Hgb stable/improved this am.   EGD unremarkable. Denies CP or SOB.   General:  NAD.  HEENT: normal  Neck: supple. JVP not elevated.  Carotids 2+ bilat; no bruits. No  lymphadenopathy or thryomegaly appreciated. Cor: LVAD hum.  Lungs: Clear. Abdomen:soft, nontender, non-distended. No hepatosplenomegaly. No bruits or masses. Good bowel sounds. Driveline site clean. Anchor in place.  Extremities: no cyanosis, clubbing, rash. Warm no edema  Neuro: alert & oriented x 3. No focal deficits. Moves all 4 without problem   EGD unremarkable. Plan for colonoscopy tomorrow per GI. Hold AC overnight.   VAD interrogated personally. Parameters stable.  Toribio Fuel, MD  4:38 PM

## 2024-02-16 ENCOUNTER — Encounter (HOSPITAL_COMMUNITY)
Admission: AD | Disposition: A | Discharge status: Home / Self Care | Payer: Self-pay | Source: Ambulatory Visit | Attending: Internal Medicine

## 2024-02-16 ENCOUNTER — Encounter (HOSPITAL_COMMUNITY): Payer: Self-pay | Admitting: Internal Medicine

## 2024-02-16 ENCOUNTER — Inpatient Hospital Stay (HOSPITAL_COMMUNITY): Admitting: Anesthesiology

## 2024-02-16 DIAGNOSIS — Z95811 Presence of heart assist device: Secondary | ICD-10-CM | POA: Diagnosis not present

## 2024-02-16 DIAGNOSIS — D649 Anemia, unspecified: Secondary | ICD-10-CM

## 2024-02-16 DIAGNOSIS — K648 Other hemorrhoids: Secondary | ICD-10-CM

## 2024-02-16 DIAGNOSIS — D122 Benign neoplasm of ascending colon: Secondary | ICD-10-CM

## 2024-02-16 DIAGNOSIS — D128 Benign neoplasm of rectum: Secondary | ICD-10-CM

## 2024-02-16 DIAGNOSIS — D125 Benign neoplasm of sigmoid colon: Secondary | ICD-10-CM | POA: Diagnosis not present

## 2024-02-16 DIAGNOSIS — I2510 Atherosclerotic heart disease of native coronary artery without angina pectoris: Secondary | ICD-10-CM

## 2024-02-16 DIAGNOSIS — K5730 Diverticulosis of large intestine without perforation or abscess without bleeding: Secondary | ICD-10-CM

## 2024-02-16 DIAGNOSIS — K627 Radiation proctitis: Secondary | ICD-10-CM

## 2024-02-16 DIAGNOSIS — I5042 Chronic combined systolic (congestive) and diastolic (congestive) heart failure: Secondary | ICD-10-CM | POA: Diagnosis not present

## 2024-02-16 HISTORY — PX: POLYPECTOMY: SHX149

## 2024-02-16 HISTORY — PX: COLONOSCOPY: SHX5424

## 2024-02-16 LAB — BASIC METABOLIC PANEL WITH GFR
Anion gap: 6 (ref 5–15)
BUN: 16 mg/dL (ref 8–23)
CO2: 19 mmol/L — ABNORMAL LOW (ref 22–32)
Calcium: 8.5 mg/dL — ABNORMAL LOW (ref 8.9–10.3)
Chloride: 110 mmol/L (ref 98–111)
Creatinine, Ser: 0.98 mg/dL (ref 0.61–1.24)
GFR, Estimated: >60 mL/min (ref >60)
Glucose, Bld: 105 mg/dL — ABNORMAL HIGH (ref 70–99)
Potassium: 4.2 mmol/L (ref 3.5–5.1)
Sodium: 135 mmol/L (ref 135–145)

## 2024-02-16 LAB — CBC
HCT: 26.9 % — ABNORMAL LOW (ref 39.0–52.0)
Hemoglobin: 8.8 g/dL — ABNORMAL LOW (ref 13.0–17.0)
MCH: 31.3 pg (ref 26.0–34.0)
MCHC: 32.7 g/dL (ref 30.0–36.0)
MCV: 95.7 fL (ref 80.0–100.0)
Platelets: 320 K/uL (ref 150–400)
RBC: 2.81 MIL/uL — ABNORMAL LOW (ref 4.22–5.81)
RDW: 18.2 % — ABNORMAL HIGH (ref 11.5–15.5)
WBC: 12 K/uL — ABNORMAL HIGH (ref 4.0–10.5)
nRBC: 0 % (ref 0.0–0.2)

## 2024-02-16 LAB — LACTATE DEHYDROGENASE: LDH: 319 U/L — ABNORMAL HIGH (ref 98–192)

## 2024-02-16 LAB — PROTIME-INR
INR: 1.6 — ABNORMAL HIGH (ref 0.8–1.2)
Prothrombin Time: 19.9 s — ABNORMAL HIGH (ref 11.4–15.2)

## 2024-02-16 SURGERY — COLONOSCOPY
Anesthesia: Monitor Anesthesia Care

## 2024-02-16 MED ORDER — ETOMIDATE 2 MG/ML IV SOLN
INTRAVENOUS | Status: DC | PRN
  Administered 2024-02-16: 6 mg via INTRAVENOUS
  Administered 2024-02-16 (×4): 2 mg via INTRAVENOUS

## 2024-02-16 MED ORDER — WARFARIN - PHARMACIST DOSING INPATIENT: Freq: Every day | Status: DC

## 2024-02-16 MED ORDER — ALBUMIN HUMAN 5 % IV SOLN: INTRAVENOUS | Status: DC | PRN

## 2024-02-16 MED ORDER — FERROUS SULFATE 325 (65 FE) MG PO TABS
325.0000 mg | ORAL_TABLET | Freq: Every day | ORAL | Status: DC
  Administered 2024-02-17 – 2024-02-18 (×2): 325 mg via ORAL
  Filled 2024-02-16 (×2): qty 1

## 2024-02-16 MED ORDER — WARFARIN SODIUM 5 MG PO TABS
5.0000 mg | ORAL_TABLET | Freq: Once | ORAL | Status: AC
  Administered 2024-02-16: 5 mg via ORAL
  Filled 2024-02-16: qty 1

## 2024-02-16 MED ORDER — PHENYLEPHRINE 80 MCG/ML (10ML) SYRINGE FOR IV PUSH (FOR BLOOD PRESSURE SUPPORT)
PREFILLED_SYRINGE | INTRAVENOUS | Status: DC | PRN
  Administered 2024-02-16 (×7): 80 ug via INTRAVENOUS

## 2024-02-16 MED ORDER — PROPOFOL 10 MG/ML IV BOLUS
INTRAVENOUS | Status: DC | PRN
  Administered 2024-02-16 (×2): 20 mg via INTRAVENOUS
  Administered 2024-02-16: 10 mg via INTRAVENOUS
  Administered 2024-02-16 (×2): 20 mg via INTRAVENOUS
  Administered 2024-02-16: 10 mg via INTRAVENOUS
  Administered 2024-02-16 (×2): 20 mg via INTRAVENOUS

## 2024-02-16 NOTE — Plan of Care (Signed)
  Problem: Education: Goal: Knowledge of General Education information will improve Description: Including pain rating scale, medication(s)/side effects and non-pharmacologic comfort measures Outcome: Progressing   Problem: Health Behavior/Discharge Planning: Goal: Ability to manage health-related needs will improve Outcome: Progressing   Problem: Clinical Measurements: Goal: Ability to maintain clinical measurements within normal limits will improve Outcome: Progressing Goal: Will remain free from infection Outcome: Progressing Goal: Diagnostic test results will improve Outcome: Progressing Goal: Respiratory complications will improve Outcome: Progressing Goal: Cardiovascular complication will be avoided Outcome: Progressing   Problem: Activity: Goal: Risk for activity intolerance will decrease Outcome: Progressing   Problem: Nutrition: Goal: Adequate nutrition will be maintained Outcome: Progressing   Problem: Coping: Goal: Level of anxiety will decrease Outcome: Progressing   Problem: Elimination: Goal: Will not experience complications related to bowel motility Outcome: Progressing Goal: Will not experience complications related to urinary retention Outcome: Progressing   Problem: Pain Managment: Goal: General experience of comfort will improve and/or be controlled Outcome: Progressing   Problem: Safety: Goal: Ability to remain free from injury will improve Outcome: Progressing   Problem: Skin Integrity: Goal: Risk for impaired skin integrity will decrease Outcome: Progressing   Problem: Education: Goal: Patient will understand all VAD equipment and how it functions Outcome: Progressing Goal: Patient will be able to verbalize current INR target range and antiplatelet therapy for discharge home Outcome: Progressing   Problem: Cardiac: Goal: LVAD will function as expected and patient will experience no clinical alarms Outcome: Progressing

## 2024-02-16 NOTE — Anesthesia Procedure Notes (Signed)
 Procedure Name: MAC Date/Time: 02/16/2024 9:50 AM  Performed by: Laverda Burnard LABOR, CRNAPre-anesthesia Checklist: Patient identified, Emergency Drugs available, Suction available and Patient being monitored Patient Re-evaluated:Patient Re-evaluated prior to induction Oxygen Delivery Method: Nasal cannula Preoxygenation: Pre-oxygenation with 100% oxygen Induction Type: IV induction Placement Confirmation: positive ETCO2 and breath sounds checked- equal and bilateral Dental Injury: Teeth and Oropharynx as per pre-operative assessment

## 2024-02-16 NOTE — Anesthesia Postprocedure Evaluation (Signed)
 Anesthesia Post Note  Patient: Steven [name]  Procedure(s) Performed: COLONOSCOPY POLYPECTOMY, INTESTINE     Patient location during evaluation: Endoscopy Anesthesia Type: MAC Level of consciousness: awake Pain management: pain level controlled Vital Signs Assessment: post-procedure vital signs reviewed and stable Respiratory status: spontaneous breathing, nonlabored ventilation and respiratory function stable Cardiovascular status: blood pressure returned to baseline and stable Postop Assessment: no apparent nausea or vomiting Anesthetic complications: no   No notable events documented.  Last Vitals:  Vitals:   02/16/24 1100 02/16/24 1116  BP: 91/63 (!) 84/61  Pulse:  68  Resp: 18 13  Temp:    SpO2:  100%    Last Pain:  Vitals:   02/16/24 1036  TempSrc: Oral  PainSc:                  Wille Aubuchon P Courtnay Petrilla

## 2024-02-16 NOTE — Progress Notes (Signed)
 VAD Coordinator Procedure Note:   VAD Coordinator met patient in 2C11. Pt undergoing colonoscopy per Dr. Dr Federico. Hemodynamics and VAD parameters monitored by myself and anesthesia throughout the procedure. Blood pressures were obtained with automatic cuff on left arm.    Time: Doppler Auto  BP Flow PI Power Speed  Pre-procedure:  0946  97/80(87) 4.5 3.2 4.1 5600                    Sedation Induction: 0955  102/73(83) 4.3 3.9 4.1 5600   1000  90/74(82) 4.6 3.1 4.2 5600   1010  106/82(91) 4.4 3.7 4.1 5600   1020  94/74(82) 4.4 3.8 4.1 5600  Recovery Area: 1027  88/76(82) 4.5 3.1 4.5 5600   1031  87/69(77) 4.5 3.2 4.2 5600    Patient tolerated the procedure well. VAD Coordinator accompanied and remained with patient in recovery area.    Patient Disposition: pt transferred to [facility] and handoff given to [place], Charity fundraiser.

## 2024-02-16 NOTE — Interval H&P Note (Signed)
 History and Physical Interval Note:  02/16/2024 9:23 AM  Steven [name]  has presented today for surgery, with the diagnosis of Anemia.  The various methods of treatment have been discussed with the patient and family. After consideration of risks, benefits and other options for treatment, the patient has consented to  Procedure(s): COLONOSCOPY (N/A) as a surgical intervention.  The patient's history has been reviewed, patient examined, no change in status, stable for surgery.  I have reviewed the patient's chart and labs.  Questions were answered to the patient's satisfaction.     Margaret Staggs C Jaycion Treml

## 2024-02-16 NOTE — Progress Notes (Signed)
 PHARMACY - ANTICOAGULATION CONSULT NOTE  Pharmacy Consult for warfarin Indication: LVAD HM3  No Known Allergies  Patient Measurements: Height: 5' 9 (175.3 cm) Weight: 67.4 kg (148 lb 9.4 oz) IBW/kg (Calculated) : 70.7  Vital Signs: Temp: 97.6 F (36.4 C) (08/23 1036) Temp Source: Oral (08/23 1036) BP: 84/61 (08/23 1116) Pulse Rate: 68 (08/23 1116)  Labs: Recent Labs    02/14/24 0223 02/14/24 2100 02/15/24 0727 02/16/24 0209  HGB 7.7*  --  8.5* 8.8*  HCT 23.7*  --  26.7* 26.9*  PLT 268  --  294 320  LABPROT 44.1* 25.9* 21.3* 19.9*  INR 4.4* 2.2* 1.8* 1.6*  CREATININE 1.21  --  1.01 0.98    Estimated Creatinine Clearance: 67.8 mL/min (by C-G formula based on SCr of 0.98 mg/dL).   Medical History: Past Medical History:  Diagnosis Date   Acute MI, anterior wall (HCC)    AICD (automatic cardioverter/defibrillator) present    CAD (coronary artery disease)    2D ECHO, 07/13/2011 - EF <25%, LV moderatelty dilated, LA moderately dilatedLEXISCAN, 12/14/2011 - moderate-severe perfusion defect seen in the basal anteroseptal, mid anterior, apicacl anterior, apical, apical inferior, and apical lateral regions, post-stress EF 25%, new EKG changes from baseline abnormalities   Cancer [facility])    Prostate   CHF (congestive heart failure) (HCC) 2012   Hypertension 08/08/2021   Inguinal hernia, left    Pneumonia    November 2023   Pre-diabetes     Medications:  Scheduled:   sodium chloride    Intravenous Once   atorvastatin   80 mg Oral Daily   docusate sodium   200 mg Oral Daily   feeding supplement  237 mL Oral BID BM   [START ON 02/17/2024] ferrous sulfate   325 mg Oral Q breakfast   finasteride   5 mg Oral Daily   pantoprazole   40 mg Oral BID   Infusions:   sodium chloride  20 mL/hr at 02/15/24 1624    Assessment: 68 YOM admitted with orthostasis and lightheadedness due to symptomatic anemia. INR on admission was 3.9, then increased up to 4.4 on 8/21. Patient given vitamin  K and x2 FFP on 8/21.  GI consulted and patient now s/p EGD and colonoscopy. No active bleeding seen on exam. INR trending down to 1.6 on 02/16/24, discussed with HF MD and will resume warfarin today. Pharmacy consulted for warfarin dosing.   PTA Regimen: 2.5 mg on Monday and Friday, 5 mg EOD.  8/23: INR 1.6, Hgb 8.8, PLT 320  Goal of Therapy:  INR Goal 2.0-2.5 Monitor platelets by anticoagulation protocol: Yes   Plan:  Warfarin 5 mg x1 today Monitor INR, CBC, and s/sx of bleeding daily  Morna Breach, PharmD PGY2 Cardiology Pharmacy Resident 02/16/2024 12:22 PM

## 2024-02-16 NOTE — Anesthesia Preprocedure Evaluation (Addendum)
 Anesthesia Evaluation  Patient identified by MRN, date of birth, ID band Patient awake    Reviewed: Allergy & Precautions, NPO status , Patient's Chart, lab work & pertinent test results  Airway Mallampati: II       Dental  (+) Edentulous Upper, Missing   Pulmonary former smoker   Pulmonary exam normal        Cardiovascular hypertension, Pt. on medications + CAD, + Past MI and +CHF  Normal cardiovascular exam+ Cardiac Defibrillator   LVAD   Neuro/Psych  PSYCHIATRIC DISORDERS      negative neurological ROS     GI/Hepatic negative GI ROS, Neg liver ROS,,,  Endo/Other  negative endocrine ROS    Renal/GU Renal disease     Musculoskeletal negative musculoskeletal ROS (+)    Abdominal   Peds  Hematology  (+) Blood dyscrasia (Warfarin), anemia   Anesthesia Other Findings Anemia  Reproductive/Obstetrics                              Anesthesia Physical Anesthesia Plan  ASA: 4  Anesthesia Plan: MAC   Post-op Pain Management:    Induction:   PONV Risk Score and Plan: 1 and Propofol  infusion and Treatment may vary due to age or medical condition  Airway Management Planned: Simple Face Mask  Additional Equipment:   Intra-op Plan:   Post-operative Plan:   Informed Consent: I have reviewed the patients History and Physical, chart, labs and discussed the procedure including the risks, benefits and alternatives for the proposed anesthesia with the patient or authorized representative who has indicated his/her understanding and acceptance.       Plan Discussed with: CRNA  Anesthesia Plan Comments:          Anesthesia Quick Evaluation

## 2024-02-16 NOTE — Transfer of Care (Signed)
 Immediate Anesthesia Transfer of Care Note  Patient: Steven [name]  Procedure(s) Performed: COLONOSCOPY POLYPECTOMY, INTESTINE  Patient Location: Nursing Unit  Anesthesia Type:MAC  Level of Consciousness: awake and patient cooperative  Airway & Oxygen Therapy: Patient Spontanous Breathing  Post-op Assessment: Report given to RN and Post -op Vital signs reviewed and stable  Post vital signs: Reviewed and stable  Last Vitals:  Vitals Value Taken Time  BP    Temp    Pulse    Resp    SpO2      Last Pain:  Vitals:   02/16/24 0931  TempSrc:   PainSc: 0-No pain         Complications: No notable events documented.

## 2024-02-16 NOTE — Progress Notes (Signed)
 Advanced Heart Failure VAD Team Note  PCP-Cardiologist: Debby Sor, MD (Inactive)  Chief Complaint: Symptomatic anemia Subjective:     Admitted with symptomatic anemia. No evidence of overt GI bleeding  8/22 EGD unrevealing x for gastritis   INR 1.6   LVAD INTERROGATION:  HeartMate III LVAD:   Flow 4.6 liters/min, speed 5600, power 4.0, PI 3.3.   Objective:    Vital Signs:   Temp:  [97.2 F (36.2 C)-99.4 F (37.4 C)] 98.3 F (36.8 C) (08/23 0813) Pulse Rate:  [30-94] 74 (08/23 0931) Resp:  [14-23] 21 (08/23 0931) BP: (74-100)/(33-81) 97/80 (08/23 0931) SpO2:  [95 %-100 %] 97 % (08/23 0931) Weight:  [67.4 kg] 67.4 kg (08/23 0500) Last BM Date : 02/15/24 Mean arterial Pressure 80s  Intake/Output:   Intake/Output Summary (Last 24 hours) at 02/16/2024 1021 Last data filed at 02/16/2024 0500 Gross per 24 hour  Intake 1941.82 ml  Output 1625 ml  Net 316.82 ml     Physical Exam   General:  NAD.  HEENT: normal  Neck: supple. JVP not elevated.  Carotids 2+ bilat; no bruits. No lymphadenopathy or thryomegaly appreciated. Cor: LVAD hum.  Lungs: Clear. Abdomen:  soft, nontender, non-distended. No hepatosplenomegaly. No bruits or masses. Good bowel sounds. Driveline site clean. Anchor in place.  Extremities: no cyanosis, clubbing, rash. Warm no edema  Neuro: alert & oriented x 3. No focal deficits. Moves all 4 without problem     Telemetry   NSR 70s Personally reviewed  Labs   Basic Metabolic Panel: Recent Labs  Lab 02/13/24 1050 02/14/24 0223 02/15/24 0727 02/16/24 0209  NA 134* 135 133* 135  K 3.5 3.8 3.9 4.2  CL 106 107 105 110  CO2 20* 20* 20* 19*  GLUCOSE 111* 120* 99 105*  BUN 19 18 16 16   CREATININE 1.39* 1.21 1.01 0.98  CALCIUM  8.5* 8.0* 8.6* 8.5*    Liver Function Tests: Recent Labs  Lab 02/13/24 1050  AST 63*  ALT 43  ALKPHOS 84  BILITOT 1.3*  PROT 6.8  ALBUMIN  2.8*   No results for input(s): LIPASE, AMYLASE in the last 168  hours. No results for input(s): AMMONIA in the last 168 hours.  CBC: Recent Labs  Lab 02/12/24 1122 02/13/24 1050 02/14/24 0223 02/15/24 0727 02/16/24 0209  WBC 15.6* 12.7* 9.7 10.9* 12.0*  HGB 8.4* 8.2* 7.7* 8.5* 8.8*  HCT 26.7* 25.4* 23.7* 26.7* 26.9*  MCV 96.4 96.6 96.7 98.5 95.7  PLT 310 295 268 294 320    INR: Recent Labs  Lab 02/13/24 1050 02/14/24 0223 02/14/24 2100 02/15/24 0727 02/16/24 0209  INR 3.9* 4.4* 2.2* 1.8* 1.6*    Other results:    Imaging   No results found.   Medications:     Scheduled Medications:  [MAR Hold] sodium chloride    Intravenous Once   [MAR Hold] atorvastatin   80 mg Oral Daily   [MAR Hold] docusate sodium   200 mg Oral Daily   [MAR Hold] feeding supplement  237 mL Oral BID BM   [MAR Hold] finasteride   5 mg Oral Daily   [MAR Hold] pantoprazole   40 mg Oral BID    Infusions:  sodium chloride  20 mL/hr at 02/16/24 0946   sodium chloride  20 mL/hr at 02/15/24 1624    PRN Medications: [MAR Hold] acetaminophen , [MAR Hold] ondansetron  (ZOFRAN ) IV, [MAR Hold] traZODone    Patient Profile   Steven [name] is a 68 y.o. male with chronic combined systolic and diastolic heart failure due to  ICM, CAD, VT s/p Medtronic ICD, HLD, apical mural thrombus, CKD Stage IIIa and h/o subdural hematoma. Underwent HM-3 VAD placement on 03/08/23. Now admitted with symptomatic anemia.   Assessment/Plan:   Symptomatic Anemia - presented to VAD Clinic with orthostasis and lightheadedness after LOW flow alarm last week - Hgb on labs 8.4, now 8.2> 7.7 -> 8.8 today.  - No obvious source of bleeding - fecal occult (-) and Fe studies stable, no indication for IV iron need at this time - EGD 8/22: unrevealing. + gastritis -> protonix  40 bid - Undergoing colonoscopy this am - ? Need for hematology w/u. LDH ok   2. Chronic combined systolic and diastolic heart failure - Due to ICM with anterior MI in 2012 - Echo 02/27/23: EF <20%, LV with GHK, RV mildly  reduced, GIIDD, LA mod dilated, mild MR.  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 GDMT limited by renal function and hypotension.  - S/P HM3 LVAD implant 03/08/23.   - NYHA I  -  Volume low. Hold diuretics. No SGLT2i with hypovolemia -  Off losartan  with low MAPs on admit. Can restart soon   3. VAD  - VAD interrogated personally. Parameters stable. -  LDH ok  - INR down to 1.6, s/p vit K and x2 FFP. Goal 2.0-2.5. Discussed warfarin dosing with PharmD personally.   4. 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  -no s/s angina - off asa as above.  - Continue statin   5. CKD stage 3a: Creatinine baseline 1.1-1.3 - cardiorenal, resolved w/ LVAD support -Scr 0.98 today   6. H/o VT s/p Medtronic ICD: likely scar-mediated - No recent VT   7. Paroxysmal Atrial fibrillation:  - Remains in NSR  I reviewed the LVAD parameters from today, and compared the results to the patient's prior recorded data.  No programming changes were made.  The LVAD is functioning within specified parameters.  The patient performs LVAD self-test daily.  LVAD interrogation was negative for any significant power changes, alarms or PI events/speed drops.  LVAD equipment check completed and is in good working order.  Back-up equipment present.   LVAD education done on emergency procedures and precautions and reviewed exit site care.  Length of Stay: 3  Toribio Fuel, MD 02/16/2024, 10:21 AM  VAD Team --- VAD ISSUES ONLY--- Pager [phone] (7am - 7am)  Advanced Heart Failure Team  Pager [phone] (M-F; 7a - 5p)  Please contact [facility] for night-coverage after hours (5p -7a ) and weekends on [email or web address]

## 2024-02-16 NOTE — Op Note (Signed)
 [facility] Patient Name: Steven [name] Procedure Date : 02/16/2024 MRN: [id number] Attending MD: Rosario Estefana Kidney , , [phone] Date of Birth: 1956-05-05 CSN: [id number] Age: 68 Admit Type: Inpatient Procedure:                Colonoscopy Indications:              Anemia Providers:                Rosario Estefana Kidney, Collene Edu, RN, Curtistine Bishop, Technician Referring MD:             Toribio SAUNDERS. Bensimhom, MD Medicines:                Monitored Anesthesia Care Complications:            No immediate complications. Estimated Blood Loss:     Estimated blood loss was minimal. Procedure:                Pre-Anesthesia Assessment:                           - Prior to the procedure, a History and Physical                            was performed, and patient medications and                            allergies were reviewed. The patient's tolerance of                            previous anesthesia was also reviewed. The risks                            and benefits of the procedure and the sedation                            options and risks were discussed with the patient.                            All questions were answered, and informed consent                            was obtained. Prior Anticoagulants: The patient has                            taken Coumadin  (warfarin), last dose was 5 days                            prior to procedure. ASA Grade Assessment: III - A                            patient with severe systemic disease. After  reviewing the risks and benefits, the patient was                            deemed in satisfactory condition to undergo the                            procedure.                           After obtaining informed consent, the colonoscope                            was passed under direct vision. Throughout the                            procedure, the patient's blood pressure,  pulse, and                            oxygen saturations were monitored continuously. The                            CF-HQ190L ([id number]) Olympus colonoscope was                            introduced through the anus and advanced to the the                            terminal ileum. Scope In: 9:56:56 AM Scope Out: 10:24:07 AM Scope Withdrawal Time: 0 hours 16 minutes 26 seconds  Total Procedure Duration: 0 hours 27 minutes 11 seconds  Findings:      The terminal ileum appeared normal.      Two sessile polyps were found in the ascending colon. The polyps were 3       to 5 mm in size. These polyps were removed with a cold snare. Resection       and retrieval were complete.      Two pedunculated polyps were found in the rectum and sigmoid colon. The       polyps were 5 to 8 mm in size. These polyps were removed with a hot       snare. Resection and retrieval were complete.      Multiple diverticula were found in the sigmoid colon.      Localized mucosal changes characterized by non-bleeding       neovascularization were found in the rectum (consistent with radiation       proctitis).      Non-bleeding internal hemorrhoids were found during retroflexion. Impression:               - The examined portion of the ileum was normal.                           - Two 3 to 5 mm polyps in the ascending colon,                            removed with a cold snare. Resected and retrieved.                           -  Two 5 to 8 mm polyps in the rectum and in the                            sigmoid colon, removed with a hot snare. Resected                            and retrieved.                           - Diverticulosis in the sigmoid colon.                           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                            Non-bleeding                           - Non-bleeding internal hemorrhoids. Recommendation:            - Return patient to hospital ward for ongoing care.                           - Radiation proctitis could be a source of anemia                            but seems less likely with a lack of rectal                            bleeding. No active bleeding seen on today's exam.                           - Await pathology results.                           - Hb has now stabilized. Can continue to trend Hb.                            If Hb drops in the future, can perform VCE for                            further evaluation.                           - Okay to restart warfarin.                           - GI will sign off for now. Please call back if any                            new questions arise.                           -  The findings and recommendations were discussed                            with the patient. Procedure Code(s):        --- Professional ---                           [phone], Colonoscopy, flexible; with removal of                            tumor(s), polyp(s), or other lesion(s) by snare                            technique Diagnosis Code(s):        --- Professional ---                           K64.8, Other hemorrhoids                           D12.2, Benign neoplasm of ascending colon                           D12.8, Benign neoplasm of rectum                           D12.5, Benign neoplasm of sigmoid colon                           K62.7, Radiation proctitis                           D64.9, Anemia, unspecified                           K57.30, Diverticulosis of large intestine without                            perforation or abscess without bleeding CPT copyright 2022 [employer] Association. All rights reserved. The codes documented in this report are preliminary and upon coder review may  be revised to meet current compliance requirements. Dr Estefana Federico Rosario Estefana Federico,  02/16/2024 10:47:50 AM Number of Addenda: 0

## 2024-02-16 NOTE — Plan of Care (Signed)
  Problem: Education: Goal: Knowledge of General Education information will improve Description: Including pain rating scale, medication(s)/side effects and non-pharmacologic comfort measures Outcome: Progressing   Problem: Nutrition: Goal: Adequate nutrition will be maintained Outcome: Progressing   Problem: Education: Goal: Patient will understand all VAD equipment and how it functions Outcome: Progressing   Problem: Cardiac: Goal: LVAD will function as expected and patient will experience no clinical alarms Outcome: Progressing

## 2024-02-17 ENCOUNTER — Encounter (HOSPITAL_COMMUNITY): Payer: Self-pay | Admitting: Internal Medicine

## 2024-02-17 DIAGNOSIS — I5042 Chronic combined systolic (congestive) and diastolic (congestive) heart failure: Secondary | ICD-10-CM | POA: Diagnosis not present

## 2024-02-17 DIAGNOSIS — D649 Anemia, unspecified: Secondary | ICD-10-CM | POA: Diagnosis not present

## 2024-02-17 DIAGNOSIS — Z95811 Presence of heart assist device: Secondary | ICD-10-CM | POA: Diagnosis not present

## 2024-02-17 DIAGNOSIS — I5020 Unspecified systolic (congestive) heart failure: Secondary | ICD-10-CM | POA: Diagnosis present

## 2024-02-17 LAB — CBC
HCT: 28.3 % — ABNORMAL LOW (ref 39.0–52.0)
Hemoglobin: 9.1 g/dL — ABNORMAL LOW (ref 13.0–17.0)
MCH: 31.5 pg (ref 26.0–34.0)
MCHC: 32.2 g/dL (ref 30.0–36.0)
MCV: 97.9 fL (ref 80.0–100.0)
Platelets: 295 K/uL (ref 150–400)
RBC: 2.89 MIL/uL — ABNORMAL LOW (ref 4.22–5.81)
RDW: 18.3 % — ABNORMAL HIGH (ref 11.5–15.5)
WBC: 11.7 K/uL — ABNORMAL HIGH (ref 4.0–10.5)
nRBC: 0 % (ref 0.0–0.2)

## 2024-02-17 LAB — PROTIME-INR
INR: 1.7 — ABNORMAL HIGH (ref 0.8–1.2)
Prothrombin Time: 20.4 s — ABNORMAL HIGH (ref 11.4–15.2)

## 2024-02-17 LAB — BASIC METABOLIC PANEL WITH GFR
Anion gap: 13 (ref 5–15)
BUN: 17 mg/dL (ref 8–23)
CO2: 13 mmol/L — ABNORMAL LOW (ref 22–32)
Calcium: 8.7 mg/dL — ABNORMAL LOW (ref 8.9–10.3)
Chloride: 108 mmol/L (ref 98–111)
Creatinine, Ser: 1.1 mg/dL (ref 0.61–1.24)
GFR, Estimated: >60 mL/min (ref >60)
Glucose, Bld: 154 mg/dL — ABNORMAL HIGH (ref 70–99)
Potassium: 4.5 mmol/L (ref 3.5–5.1)
Sodium: 134 mmol/L — ABNORMAL LOW (ref 135–145)

## 2024-02-17 LAB — LACTATE DEHYDROGENASE: LDH: 427 U/L — ABNORMAL HIGH (ref 98–192)

## 2024-02-17 LAB — HAPTOGLOBIN: Haptoglobin: 23 mg/dL — ABNORMAL LOW (ref 32–363)

## 2024-02-17 MED ORDER — WARFARIN SODIUM 5 MG PO TABS
5.0000 mg | ORAL_TABLET | Freq: Once | ORAL | Status: AC
  Administered 2024-02-17: 5 mg via ORAL
  Filled 2024-02-17: qty 1

## 2024-02-17 NOTE — Progress Notes (Signed)
 Advanced Heart Failure VAD Team Note  PCP-Cardiologist: Debby Sor, MD (Inactive)  Chief Complaint: Symptomatic anemia Subjective:     Admitted with symptomatic anemia. No evidence of overt GI bleeding  8/22 EGD unrevealing x for gastritis 8/23 coloscopy no souce of bleeding 4 polyps biopsied  Feels ok. No melena, ab pain or BRBPR>   INR 1.7   LVAD INTERROGATION:  HeartMate III LVAD:   Flow 4.6 iters/min, speed 5600, power 4.0, PI 3.3  Objective:    Vital Signs:   Temp:  [98 F (36.7 C)-99.9 F (37.7 C)] 98.6 F (37 C) (08/24 1539) Pulse Rate:  [72-86] 86 (08/24 1539) Resp:  [14-20] 19 (08/24 1539) BP: (80-96)/(64-80) 80/66 (08/24 1600) SpO2:  [97 %-100 %] 100 % (08/24 1123) Weight:  [69.8 kg] 69.8 kg (08/24 0500) Last BM Date : 02/16/24 Mean arterial Pressure 80s  Intake/Output:   Intake/Output Summary (Last 24 hours) at 02/17/2024 1636 Last data filed at 02/17/2024 1544 Gross per 24 hour  Intake 360 ml  Output 1500 ml  Net -1140 ml     Physical Exam   General:  NAD.  HEENT: normal  Neck: supple. JVP not elevated.  Carotids 2+ bilat; no bruits. No lymphadenopathy or thryomegaly appreciated. Cor: LVAD hum.  Lungs: Clear. Abdomen: soft, nontender, non-distended. No hepatosplenomegaly. No bruits or masses. Good bowel sounds. Driveline site clean. Anchor in place.  Extremities: no cyanosis, clubbing, rash. Warm no edema  Neuro: alert & oriented x 3. No focal deficits. Moves all 4 without problem     Telemetry   NSR 70-80s Personally reviewed  Labs   Basic Metabolic Panel: Recent Labs  Lab 02/13/24 1050 02/14/24 0223 02/15/24 0727 02/16/24 0209 02/17/24 0849  NA 134* 135 133* 135 134*  K 3.5 3.8 3.9 4.2 4.5  CL 106 107 105 110 108  CO2 20* 20* 20* 19* 13*  GLUCOSE 111* 120* 99 105* 154*  BUN 19 18 16 16 17   CREATININE 1.39* 1.21 1.01 0.98 1.10  CALCIUM  8.5* 8.0* 8.6* 8.5* 8.7*    Liver Function Tests: Recent Labs  Lab 02/13/24 1050   AST 63*  ALT 43  ALKPHOS 84  BILITOT 1.3*  PROT 6.8  ALBUMIN  2.8*   No results for input(s): LIPASE, AMYLASE in the last 168 hours. No results for input(s): AMMONIA in the last 168 hours.  CBC: Recent Labs  Lab 02/13/24 1050 02/14/24 0223 02/15/24 0727 02/16/24 0209 02/17/24 0849  WBC 12.7* 9.7 10.9* 12.0* 11.7*  HGB 8.2* 7.7* 8.5* 8.8* 9.1*  HCT 25.4* 23.7* 26.7* 26.9* 28.3*  MCV 96.6 96.7 98.5 95.7 97.9  PLT 295 268 294 320 295    INR: Recent Labs  Lab 02/14/24 0223 02/14/24 2100 02/15/24 0727 02/16/24 0209 02/17/24 0941  INR 4.4* 2.2* 1.8* 1.6* 1.7*    Other results:    Imaging   No results found.   Medications:     Scheduled Medications:  sodium chloride    Intravenous Once   atorvastatin   80 mg Oral Daily   docusate sodium   200 mg Oral Daily   feeding supplement  237 mL Oral BID BM   ferrous sulfate   325 mg Oral Q breakfast   finasteride   5 mg Oral Daily   pantoprazole   40 mg Oral BID   Warfarin - Pharmacist Dosing Inpatient   Does not apply q1600    Infusions:    PRN Medications: acetaminophen , ondansetron  (ZOFRAN ) IV, traZODone    Patient Profile   Steven [name] is  a 68 y.o. 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 Underwent HM-3 VAD placement on 03/08/23. Now admitted with symptomatic anemia.   Assessment/Plan:   Symptomatic Anemia - presented to VAD Clinic with orthostasis and lightheadedness after LOW flow alarm last week - Hgb on labs 8.4, now 8.2> 7.7 -> 8.8 -> 9.1 today.  - No obvious source of bleeding - fecal occult (-) and Fe studies stable, no indication for IV iron need at this time - EGD 8/22: unrevealing. + gastritis -> protonix  40 bid - Colon 8/23 ok  - Hgb starting to improved  - ? Need for hematology w/u. LDH ok   2. Chronic combined systolic and diastolic heart failure - Due to  ICM with anterior MI in 2012 - Echo 02/27/23: EF <20%, LV with GHK, RV mildly reduced, GIIDD, LA mod dilated, mild MR.  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 GDMT limited by renal function and hypotension.  - S/P HM3 LVAD implant 03/08/23.   - NYHA I  -  Volume low. Encourage po intake No SGLT2i with hypovolemia -  Off losartan  with low MAPs on admit. Can restart soon   3. VAD  - VAD interrogated personally. Parameters stable. -  LDH ok  - INR 1.7, s/p vit K and x2 FFP. Goal 2.0-2.5. Discussed warfarin dosing with PharmD personally.   4. 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  - no s/s angia - off asa as above.  - Continue statin   5. CKD stage 3a: Creatinine baseline 1.1-1.3 - cardiorenal, resolved w/ LVAD support -Scr 1.10 today   6. H/o VT s/p Medtronic ICD: likely scar-mediated - No recent VT   7. Paroxysmal Atrial fibrillation:  - Remains in NSR  Hopefully can ambulate him and get him home tomorrow  Length of Stay: 4  Toribio Fuel, MD 02/17/2024, 4:36 PM  VAD Team --- VAD ISSUES ONLY--- Pager [phone] (7am - 7am)  Advanced Heart Failure Team  Pager [phone] (M-F; 7a - 5p)  Please contact [facility] for night-coverage after hours (5p -7a ) and weekends on [email or web address]

## 2024-02-17 NOTE — Progress Notes (Signed)
 PHARMACY - ANTICOAGULATION CONSULT NOTE  Pharmacy Consult for warfarin Indication: LVAD HM3  No Known Allergies  Patient Measurements: Height: 5' 9 (175.3 cm) Weight: 69.8 kg (153 lb 14.1 oz) IBW/kg (Calculated) : 70.7  Vital Signs: Temp: 98.4 F (36.9 C) (08/24 1123) Temp Source: Oral (08/24 1123) BP: 81/71 (08/24 1200) Pulse Rate: 78 (08/24 1123)  Labs: Recent Labs    02/15/24 0727 02/16/24 0209 02/17/24 0849 02/17/24 0941  HGB 8.5* 8.8* 9.1*  --   HCT 26.7* 26.9* 28.3*  --   PLT 294 320 295  --   LABPROT 21.3* 19.9*  --  20.4*  INR 1.8* 1.6*  --  1.7*  CREATININE 1.01 0.98 1.10  --     Estimated Creatinine Clearance: 62.6 mL/min (by C-G formula based on SCr of 1.1 mg/dL).   Medical History: Past Medical History:  Diagnosis Date   Acute MI, anterior wall (HCC)    AICD (automatic cardioverter/defibrillator) present    CAD (coronary artery disease)    2D ECHO, 07/13/2011 - EF <25%, LV moderatelty dilated, LA moderately dilatedLEXISCAN, 12/14/2011 - moderate-severe perfusion defect seen in the basal anteroseptal, mid anterior, apicacl anterior, apical, apical inferior, and apical lateral regions, post-stress EF 25%, new EKG changes from baseline abnormalities   Cancer [facility])    Prostate   CHF (congestive heart failure) (HCC) 2012   Hypertension 08/08/2021   Inguinal hernia, left    Pneumonia    November 2023   Pre-diabetes     Medications:  Scheduled:   sodium chloride    Intravenous Once   atorvastatin   80 mg Oral Daily   docusate sodium   200 mg Oral Daily   feeding supplement  237 mL Oral BID BM   ferrous sulfate   325 mg Oral Q breakfast   finasteride   5 mg Oral Daily   pantoprazole   40 mg Oral BID   Warfarin - Pharmacist Dosing Inpatient   Does not apply q1600   Infusions:     Assessment: 19 YOM admitted with orthostasis and lightheadedness due to symptomatic anemia. INR on admission was 3.9, then increased up to 4.4 on 8/21. Patient given vitamin  K and x2 FFP on 8/21.  GI consulted and patient now s/p EGD and colonoscopy. No active bleeding seen on exam. INR trending down to 1.6 on 02/16/24, discussed with HF MD and will resume warfarin today. Pharmacy consulted for warfarin dosing.   PTA Regimen: 2.5 mg on Monday and Friday, 5 mg EOD.  8/24: INR 1.7, Hgb 9.1, PLT 295  Goal of Therapy:  INR Goal 2.0-2.5 Monitor platelets by anticoagulation protocol: Yes   Plan:  Warfarin 5 mg x1 today Monitor INR, CBC, and s/sx of bleeding daily  Morna Breach, PharmD PGY2 Cardiology Pharmacy Resident 02/17/2024 2:10 PM

## 2024-02-17 NOTE — Discharge Summary (Incomplete)
 LVAD Discharge Summary   Patient ID: Steven [name] MRN: [id number]; DOB: 25-Mar-1956  Admit date: 02/13/2024 Discharge date: 02/18/2024  PCP:  Onita Devere POUR, MD   [facility] Providers Cardiologist:  Dr. Cherrie   Electrophysiologist:  Jerel Balding, MD     Discharge Diagnoses  Principal Problem:   Symptomatic anemia Active Problems:   CAD (coronary artery disease)   ICD  Medtronic protecta dual-chamber, implanted September 2012 primary prevention   Stage 3a chronic kidney disease (HCC)   Supratherapeutic INR   LVAD (left ventricular assist device) present (HCC)   HFrEF (heart failure with reduced ejection fraction) (HCC)   Diagnostic Studies/Procedures  EGD 02/15/24: no active bleeding, +gastritis Colonoscopy 02/16/24: polyps, radiation proctitis, diverticulosis, no active bleeding _____________   History of Present Illness   Steven [name] is a 68 y.o. male with chronic combined systolic and diastolic heart failure due to ICM, CAD (s/p large OOH/late presenting ant MI 06/2010 tx w DES to LAD), VT s/p Medtronic ICD, HLD, apical mural thrombus, CKD Stage IIIa, prostate CA and h/o subdural hematoma. He was admitted in 02/2023 with low output failure and EF < 20 and underwent HM-3 VAD placement on 03/08/23. He had post op atrial fibrillation.   He paged the VAD Coordinators on 02/03/24 with asymptomatic LOW FLOW alarm while lying in bed on side. Alarm resolved with repositioning and drinking 2 bottles of water. He was then seen 8/19 in VAD clinic, reported no other VAD alarms. However was feeling lightheaded and dizzy with ambulation. Denied signs of bleeding. However hgb came back at 8.4. He was then admitted from home for symptomatic anemia.   Hospital Course   Consultants: GI (Dr. Federico)   Symptomatic Anemia - He presented to VAD Clinic the day prior to admission with orthostasis and lightheadedness after LOW flow alarm the week before - Hgb on labs 8.4, now 8.2  today - No obvious source of bleeding - Hemoccult was neg. Fe studies were stable. There was no indication for IV Fe. - Warfarin was placed on hold - INR remained supra-therapeutic and FFP, Vit K was given 8/21 - Pt was seen by GI. EGD on 8/22 showed normal esophagus, small hiatal hernia, gastritis (Bx sent).  - PPI twice daily x 8 weeks, then once daily recommended.  - Colonoscopy 8/23 showed several polyps which were removed, diverticulosis, changes c/w radiation proctitis (non bleeding) - no active bleeding noted - GI cleared pt to restart Warfarin - Video capsule endoscopy can be done in future if recurrent drop in Hgb.    2. Chronic combined systolic and diastolic heart failure - Due to ICM with anterior MI in 2012 - Echo 02/27/23: EF <20%, LV with GHK, RV mildly reduced, GIIDD, LA mod dilated, mild MR.  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 GDMT limited by renal function and hypotension.  - S/P HM3 LVAD implant 03/08/23.   - Pt was stable NYHA Class I and appeared dry. IVFs given prior to d/c - Ramp echo done 8/25 (see echo note) - Diuretics and Losartan  were held. He was not placed on SGLT2i with hypovolemia   3. VAD  - Fixed speed 5600. Last RAMP 3/25. - Drop in Hgb noted as above. - Warfarin resumed 8/23. Per PharmD, plan 2.5 mg MWF and 5 mg all other days - LDH ok, INR 1.8 today  - Driveline ok; MAP ok    4. CAD:  - h/o large anterior MI 2012 treated w/ DES  to LAD.  - No s/s angina - off asa as above.  - Continue statin   5. CKD stage 3a: Creatinine baseline 1.1-1.3 - cardiorenal, resolved w/ LVAD support - SCr 1.04 at DC   6. H/o VT s/p Medtronic ICD: likely scar-mediated - No recent VT   7. Paroxysmal Atrial fibrillation:  - In NSR. Off amio. Warfarin held during work up for anemia but resumed prior to DC.     Pt seen and examined by Dr. Cherrie today and felt stable for d/c home.   Hospital f/u has been arranged in LVAD and Coumadin  Clinics.    LVAD INTERROGATION:  HeartMate III LVAD:   Flow 4.6 iters/min, speed 5600, power 4.0, PI 2.6  _____________  Discharge Vitals Blood pressure (!) 83/70, pulse 77, temperature 98.1 F (36.7 C), temperature source Oral, resp. rate 20, height 5' 9 (1.753 m), weight 65 kg, SpO2 97%.  Filed Weights   02/16/24 0500 02/17/24 0500 02/18/24 0506  Weight: 67.4 kg 69.8 kg 65 kg    Labs & Radiologic Studies  CBC   Recent Labs    02/14/24 0223 02/15/24 0727 02/16/24 0209 02/17/24 0849 02/18/24 0905  WBC 9.7 10.9* 12.0* 11.7* 10.0  HGB 7.7* 8.5* 8.8* 9.1* 8.7*  HCT 23.7* 26.7* 26.9* 28.3* 26.8*  MCV 96.7 98.5 95.7 97.9 95.7  PLT 268 294 320 295 294    Recent Labs    02/13/24 1328  IRON 42*  TIBC 204*  IRONPCTSAT 21  UIBC 162   Basic Metabolic Panel Recent Labs    91/75/74 0849 02/18/24 0905  NA 134* 131*  K 4.5 3.8  CL 108 103  CO2 13* 19*  GLUCOSE 154* 200*  BUN 17 15  CREATININE 1.10 1.04  CALCIUM  8.7* 8.6*   Recent Labs    02/16/24 0209 02/17/24 0849 02/18/24 0905  LDH 319* 427* 283*    Recent Labs    02/13/24 1328  VITAMINB12 2,064*    Recent Labs    02/14/24 2100 02/15/24 0727 02/16/24 0209 02/17/24 0941 02/18/24 0905  LABPROT 25.9* 21.3* 19.9* 20.4* 21.4*  INR 2.2* 1.8* 1.6* 1.7* 1.8*    Recent Labs    02/13/24 1952  OCCULTBLD NEGATIVE   _____________  No results found.  Disposition Pt is being discharged home today in good condition.  Follow-up Plans & Appointments  Follow-up Information      [facility] Follow up.   Specialty: Cardiology Why: LVAD Clinic  Hospital Follow-up 02/21/24 at 10:00 AM Contact information: [address] [place]  [phone]                 Discharge Medications Allergies as of 02/18/2024   No Known Allergies      Medication List     STOP taking these medications    losartan  50 MG tablet Commonly known as:  COZAAR    torsemide  20 MG tablet Commonly known as: DEMADEX        TAKE these medications    acetaminophen  500 MG tablet Commonly known as: TYLENOL  Take 1,000 mg by mouth daily as needed for mild pain (pain score 1-3) or moderate pain (pain score 4-6).   atorvastatin  80 MG tablet Commonly known as: LIPITOR  Take 1 tablet (80 mg total) by mouth daily.   CertaVite/Antioxidants Tabs Take 1 tablet by mouth daily.   docusate sodium  100 MG capsule Commonly known as: COLACE Take 2 capsules (200 mg total) by mouth daily.  ferrous sulfate  325 (65 FE) MG tablet Take 1 tablet (325 mg total) by mouth daily with breakfast. Start taking on: February 19, 2024   finasteride  5 MG tablet Commonly known as: PROSCAR  Take 1 tablet (5 mg total) by mouth daily.   pantoprazole  40 MG tablet Commonly known as: PROTONIX  Take 1 tablet (40 mg total) by mouth daily.   tamsulosin  0.4 MG Caps capsule Commonly known as: FLOMAX  Take 1 capsule (0.4 mg total) by mouth daily.   traZODone  50 MG tablet Commonly known as: DESYREL  Take 1 tablet (50 mg total) by mouth at bedtime as needed for sleep.   warfarin 2.5 MG tablet Commonly known as: COUMADIN  Take as directed. If you are unsure how to take this medication, talk to your nurse or doctor. Original instructions: Take 2.5 mg (1 tablet) every Mon,Wed, Fri; 5 mg (2 tablets) all other days; or as directed by heart failure clinic What changed: additional instructions        Outstanding Labs/Studies    Duration of Discharge Encounter: APP Time: 30 minutes   Signed, Caffie Shed, PA-C 02/18/2024, 3:30 PM  Patient seen and examined with the above-signed Advanced Practice Provider and/or Housestaff. I personally reviewed laboratory data, imaging studies and relevant notes. I independently examined the patient and formulated the important aspects of the plan. I have edited the note to reflect any of my changes or salient points. I have personally  discussed the plan with the patient and/or family.  See rounding note from earlier today. He is ok for d/c today with close f/u.   MD time 37 mins  Toribio Fuel, MD  9:41 AM

## 2024-02-17 NOTE — Plan of Care (Signed)
  Problem: Activity: Goal: Risk for activity intolerance will decrease Outcome: Progressing   Problem: Nutrition: Goal: Adequate nutrition will be maintained Outcome: Progressing   Problem: Cardiac: Goal: LVAD will function as expected and patient will experience no clinical alarms Outcome: Progressing

## 2024-02-18 ENCOUNTER — Ambulatory Visit: Payer: Self-pay | Admitting: Internal Medicine

## 2024-02-18 ENCOUNTER — Other Ambulatory Visit (HOSPITAL_COMMUNITY): Payer: Self-pay

## 2024-02-18 ENCOUNTER — Inpatient Hospital Stay (HOSPITAL_COMMUNITY)

## 2024-02-18 ENCOUNTER — Encounter (HOSPITAL_COMMUNITY): Payer: Self-pay

## 2024-02-18 DIAGNOSIS — I5020 Unspecified systolic (congestive) heart failure: Secondary | ICD-10-CM | POA: Diagnosis not present

## 2024-02-18 DIAGNOSIS — I5042 Chronic combined systolic (congestive) and diastolic (congestive) heart failure: Secondary | ICD-10-CM | POA: Diagnosis not present

## 2024-02-18 DIAGNOSIS — D649 Anemia, unspecified: Secondary | ICD-10-CM | POA: Diagnosis not present

## 2024-02-18 DIAGNOSIS — Z95811 Presence of heart assist device: Secondary | ICD-10-CM | POA: Diagnosis not present

## 2024-02-18 LAB — BASIC METABOLIC PANEL WITH GFR
Anion gap: 9 (ref 5–15)
BUN: 15 mg/dL (ref 8–23)
CO2: 19 mmol/L — ABNORMAL LOW (ref 22–32)
Calcium: 8.6 mg/dL — ABNORMAL LOW (ref 8.9–10.3)
Chloride: 103 mmol/L (ref 98–111)
Creatinine, Ser: 1.04 mg/dL (ref 0.61–1.24)
GFR, Estimated: >60 mL/min (ref >60)
Glucose, Bld: 200 mg/dL — ABNORMAL HIGH (ref 70–99)
Potassium: 3.8 mmol/L (ref 3.5–5.1)
Sodium: 131 mmol/L — ABNORMAL LOW (ref 135–145)

## 2024-02-18 LAB — ECHOCARDIOGRAM LIMITED
Est EF: <20
Height: 69 in
Weight: 2292.78 [oz_av]

## 2024-02-18 LAB — CBC
HCT: 26.8 % — ABNORMAL LOW (ref 39.0–52.0)
Hemoglobin: 8.7 g/dL — ABNORMAL LOW (ref 13.0–17.0)
MCH: 31.1 pg (ref 26.0–34.0)
MCHC: 32.5 g/dL (ref 30.0–36.0)
MCV: 95.7 fL (ref 80.0–100.0)
Platelets: 294 K/uL (ref 150–400)
RBC: 2.8 MIL/uL — ABNORMAL LOW (ref 4.22–5.81)
RDW: 17.9 % — ABNORMAL HIGH (ref 11.5–15.5)
WBC: 10 K/uL (ref 4.0–10.5)
nRBC: 0 % (ref 0.0–0.2)

## 2024-02-18 LAB — LACTATE DEHYDROGENASE: LDH: 283 U/L — ABNORMAL HIGH (ref 98–192)

## 2024-02-18 LAB — PROTIME-INR
INR: 1.8 — ABNORMAL HIGH (ref 0.8–1.2)
Prothrombin Time: 21.4 s — ABNORMAL HIGH (ref 11.4–15.2)

## 2024-02-18 LAB — SURGICAL PATHOLOGY

## 2024-02-18 MED ORDER — LACTATED RINGERS IV BOLUS: 500.0000 mL | Freq: Once | INTRAVENOUS | Status: AC

## 2024-02-18 MED ORDER — FERROUS SULFATE 325 (65 FE) MG PO TABS
325.0000 mg | ORAL_TABLET | Freq: Every day | ORAL | 3 refills | Status: DC
  Filled 2024-02-18: qty 30, 30d supply, fill #0

## 2024-02-18 MED ORDER — WARFARIN SODIUM 2.5 MG PO TABS
2.5000 mg | ORAL_TABLET | Freq: Once | ORAL | Status: AC
  Administered 2024-02-18: 2.5 mg via ORAL
  Filled 2024-02-18: qty 1

## 2024-02-18 MED ORDER — WARFARIN SODIUM 2.5 MG PO TABS
ORAL_TABLET | ORAL | 6 refills | Status: DC
  Filled 2024-02-18: qty 60, 30d supply, fill #0
  Filled 2024-04-01: qty 60, 30d supply, fill #1

## 2024-02-18 MED ORDER — LACTATED RINGERS IV BOLUS
1000.0000 mL | Freq: Once | INTRAVENOUS | Status: AC
  Administered 2024-02-18: 1000 mL via INTRAVENOUS

## 2024-02-18 MED ORDER — LACTATED RINGERS IV BOLUS
500.0000 mL | Freq: Once | INTRAVENOUS | Status: AC
  Administered 2024-02-18: 500 mL via INTRAVENOUS

## 2024-02-18 NOTE — Plan of Care (Signed)
  Problem: Education: Goal: Knowledge of General Education information will improve Description: Including pain rating scale, medication(s)/side effects and non-pharmacologic comfort measures Outcome: Progressing   Problem: Health Behavior/Discharge Planning: Goal: Ability to manage health-related needs will improve Outcome: Progressing   Problem: Clinical Measurements: Goal: Ability to maintain clinical measurements within normal limits will improve Outcome: Progressing Goal: Will remain free from infection Outcome: Progressing Goal: Diagnostic test results will improve Outcome: Progressing Goal: Respiratory complications will improve Outcome: Progressing Goal: Cardiovascular complication will be avoided Outcome: Progressing   Problem: Activity: Goal: Risk for activity intolerance will decrease Outcome: Progressing   Problem: Nutrition: Goal: Adequate nutrition will be maintained Outcome: Progressing   Problem: Coping: Goal: Level of anxiety will decrease Outcome: Progressing   Problem: Pain Managment: Goal: General experience of comfort will improve and/or be controlled Outcome: Progressing   Problem: Safety: Goal: Ability to remain free from injury will improve Outcome: Progressing   Problem: Skin Integrity: Goal: Risk for impaired skin integrity will decrease Outcome: Progressing   Problem: Education: Goal: Patient will understand all VAD equipment and how it functions Outcome: Progressing Goal: Patient will be able to verbalize current INR target range and antiplatelet therapy for discharge home Outcome: Progressing   Problem: Cardiac: Goal: LVAD will function as expected and patient will experience no clinical alarms Outcome: Progressing

## 2024-02-18 NOTE — Progress Notes (Addendum)
 Speed  Flow  PI  Power  LVIDD  AI  Aortic opening MR  TR  Septum  RV  VTI (>18cm)  5600  4.7 3.4 4.2 5.3 trace 3/5 none trace midline WNL  17.2  5500  4.5 3.2 4.0  trace 3/5 none trace midline WNL    5400 4.6 3.2 3.8 5.1 trace 3/5 none trace Midline WNL 17.3                                             Auto cuff BP: 83/70(76)   Ramp ECHO performed at bedside per Dr Cherrie  At completion of ramp study, patients primary controller programmed:  Fixed speed:5600 Low speed limit:5300   Schuyler Gladis PEAK, BSN VAD Coordinator 24/7 Pager [phone]

## 2024-02-18 NOTE — Progress Notes (Signed)
 Mobility Specialist Progress Note;   02/18/24 0949  Mobility  Activity Ambulated with assistance  Level of Assistance Contact guard assist, steadying assist  Assistive Device None  Distance Ambulated (ft) 350 ft  Activity Response Tolerated well  Mobility Referral Yes  Mobility visit 1 Mobility  Mobility Specialist Start Time (ACUTE ONLY) 0949  Mobility Specialist Stop Time (ACUTE ONLY) 1005  Mobility Specialist Time Calculation (min) (ACUTE ONLY) 16 min   Pt agreeable to mobility. Required light MinG assistance during ambulation for safety. VSS throughout. No c/o dizziness or unsteadiness when asked. Pt able to operate LVAD equipment independently. Pt returned back to bed and left with all needs met, call bell in reach. RN notified,   Lauraine Erm Mobility Specialist Please contact via SecureChat or [employer] [phone]

## 2024-02-18 NOTE — Progress Notes (Addendum)
 Advanced Heart Failure VAD Team Note  PCP-Cardiologist: Debby Sor, MD (Inactive)  Chief Complaint: Symptomatic anemia Subjective:    Admitted with symptomatic anemia. No evidence of overt GI bleeding  8/22: EGD unrevealing x for gastritis 8/23: coloscopy no souce of bleeding 4 polyps biopsied  Labs pending. LDH up and Bicarb down yesterday. Soft MAP this morning with low PI and 100+ PI events.   Feeling ok. Ambulated over the weekend, plans to get out of bed today. Wants to go home.   LVAD INTERROGATION:  HeartMate III LVAD:   Flow 4.6 iters/min, speed 5600, power 4.0, PI 2.6  Objective:    Vital Signs:   Temp:  [98.4 F (36.9 C)-98.8 F (37.1 C)] 98.7 F (37.1 C) (08/25 0740) Pulse Rate:  [78-86] 80 (08/25 0740) Resp:  [16-20] 16 (08/25 0740) BP: (80-87)/(51-72) 85/72 (08/25 0800) SpO2:  [96 %-100 %] 97 % (08/25 0740) Weight:  [65 kg] 65 kg (08/25 0506) Last BM Date : 02/16/24  Doppler Pressure: 65-70  Intake/Output:  Intake/Output Summary (Last 24 hours) at 02/18/2024 0912 Last data filed at 02/18/2024 0506 Gross per 24 hour  Intake 120 ml  Output 2400 ml  Net -2280 ml    Physical Exam   General: Well appearing. No distress on RA Cardiac: JVP depressed. S1 and S2 present. No murmurs or rub. Extremities: Warm and dry.  No peripheral edema.  Neuro: Alert and oriented x3. Affect flat  Telemetry   SR in 70s (personally reviewed)  Labs   Basic Metabolic Panel: Recent Labs  Lab 02/13/24 1050 02/14/24 0223 02/15/24 0727 02/16/24 0209 02/17/24 0849  NA 134* 135 133* 135 134*  K 3.5 3.8 3.9 4.2 4.5  CL 106 107 105 110 108  CO2 20* 20* 20* 19* 13*  GLUCOSE 111* 120* 99 105* 154*  BUN 19 18 16 16 17   CREATININE 1.39* 1.21 1.01 0.98 1.10  CALCIUM  8.5* 8.0* 8.6* 8.5* 8.7*   Liver Function Tests: Recent Labs  Lab 02/13/24 1050  AST 63*  ALT 43  ALKPHOS 84  BILITOT 1.3*  PROT 6.8  ALBUMIN  2.8*   No results for input(s): LIPASE, AMYLASE in  the last 168 hours. No results for input(s): AMMONIA in the last 168 hours.  CBC: Recent Labs  Lab 02/13/24 1050 02/14/24 0223 02/15/24 0727 02/16/24 0209 02/17/24 0849  WBC 12.7* 9.7 10.9* 12.0* 11.7*  HGB 8.2* 7.7* 8.5* 8.8* 9.1*  HCT 25.4* 23.7* 26.7* 26.9* 28.3*  MCV 96.6 96.7 98.5 95.7 97.9  PLT 295 268 294 320 295   INR: Recent Labs  Lab 02/14/24 0223 02/14/24 2100 02/15/24 0727 02/16/24 0209 02/17/24 0941  INR 4.4* 2.2* 1.8* 1.6* 1.7*   Medications:    Scheduled Medications:  sodium chloride    Intravenous Once   atorvastatin   80 mg Oral Daily   docusate sodium   200 mg Oral Daily   feeding supplement  237 mL Oral BID BM   ferrous sulfate   325 mg Oral Q breakfast   finasteride   5 mg Oral Daily   pantoprazole   40 mg Oral BID   Warfarin - Pharmacist Dosing Inpatient   Does not apply q1600    Infusions:    PRN Medications: acetaminophen , ondansetron  (ZOFRAN ) IV, traZODone   Patient Profile   Steven [name] is a 68 y.o. 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 Underwent HM-3 VAD placement  on 03/08/23. Now admitted with symptomatic anemia.   Assessment/Plan:    Symptomatic Anemia - presented to VAD Clinic with orthostasis and lightheadedness after LOW flow alarm last week - Hgb on labs 8.4, now 8.2> 7.7 -> 8.8 -> 9.1 today.  - No obvious source of bleeding - fecal occult (-) and Fe studies stable, no indication for IV iron need at this time - EGD 8/22: unrevealing. + gastritis -> protonix  40 bid - Colonoscopy 8/23 ok  - Hgb stable. Labs pending today - ? Need for hematology w/u. LDH slightly up in 400s   2. Chronic combined systolic and diastolic heart failure - Due to ICM with anterior MI in 2012 - Echo 02/27/23: EF <20%, LV with GHK, RV mildly reduced, GIIDD, LA mod dilated, mild MR.  End-stage HF w/ low output and inotrope  dependent. GDMT limited by renal function and hypotension.  - S/P HM3 LVAD implant 03/08/23.   - NYHA I  - Hypovolemic. PI low, LDH up. Bicarb down. Having 100+ PI. Likely lost more volume than charted with bowel prep.  - Give LR 500 bolus - No SGLT2i with hypovolemia - Off losartan  with low MAPs   3. VAD  - VAD interrogated personally. Parameters stable. - LDH slightly up, PI in low 2s, 100+ PI events. Plans as above. - INR 1.7. Goal 2.0-2.5. Discussed warfarin dosing with PharmD personally.   4. 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  - no s/s angia - off asa as above.  - Continue statin   5. CKD stage 3a: Creatinine baseline 1.1-1.3 - cardiorenal, resolved w/ LVAD support - BMET pending   6. H/o VT s/p Medtronic ICD: likely scar-mediated - No recent VT   7. Paroxysmal Atrial fibrillation:  - Remains in NSR  Hope can discharge later today after IV hydration.  Length of Stay: 5  I reviewed the LVAD parameters from today, and compared the results to the patient's prior recorded data.  No programming changes were made.  The LVAD is functioning within specified parameters.  The patient performs LVAD self-test daily.  LVAD interrogation was negative for any significant power changes, alarms or PI events/speed drops.  LVAD equipment check completed and is in good working order.  Back-up equipment present.   LVAD education done on emergency procedures and precautions and reviewed exit site care.  [place] Lee, NP 02/18/2024, 9:12 AM  VAD Team --- VAD ISSUES ONLY--- Pager [phone] (7am - 7am)  Advanced Heart Failure Team  Pager [phone] (M-F; 7a - 5p)  Please contact [facility] for night-coverage after hours (5p -7a ) and weekends on [email or web address]  Patient seen and examined with the above-signed Advanced Practice Provider and/or Housestaff. I personally reviewed laboratory data, imaging studies and relevant notes. I independently examined the patient and  formulated the important aspects of the plan. I have edited the note to reflect any of my changes or salient points. I have personally discussed the plan with the patient and/or family.  MAPs low and multiple PI events today. Feels ok. No bleeding. Hgb 8.7  Denies SOB.  General:  NAD.  HEENT: normal  Neck: supple. JVP not elevated.  Carotids 2+ bilat; no bruits. No lymphadenopathy or thryomegaly appreciated. Cor: LVAD hum.  Lungs: Clear. Abdomen:  soft, nontender, non-distended. No hepatosplenomegaly. No bruits or masses. Good bowel sounds. Driveline site clean. Anchor in place.  Extremities: no cyanosis, clubbing, rash. Warm no edema  Neuro: alert & oriented x  3. No focal deficits. Moves all 4 without problem   VAD interrogated personally. Having frequent PI events. Volume down. Hgb ok   Will hydrate. Check ramp echo  INR 1.8.   Toribio Fuel, MD  11:56 AM

## 2024-02-18 NOTE — TOC Transition Note (Signed)
 Transition of Care [facility]) - Discharge Note   Patient Details  Name: Steven [name] MRN: [id number] Date of Birth: 12-07-1955  [facility]) CM/SW Contact:  Waddell Barnie Rama, RN Phone Number: 02/18/2024, 4:43 PM   Clinical Narrative:    For dc today,  no consult for needs.      Barriers to Discharge: Continued Medical Work up   Patient Goals and CMS Choice Patient states their goals for this hospitalization and ongoing recovery are:: wants to get better          Discharge Placement                       Discharge Plan and Services Additional resources added to the After Visit Summary for     Discharge Planning Services: CM Consult                                 Social Drivers of Health (SDOH) Interventions SDOH Screenings   Food Insecurity: No Food Insecurity (02/13/2024)  Housing: Low Risk  (02/13/2024)  Transportation Needs: No Transportation Needs (02/13/2024)  Utilities: Not At Risk (02/13/2024)  Alcohol Screen: Low Risk  (05/08/2022)  Depression (PHQ2-9): Low Risk  (01/24/2024)  Financial Resource Strain: Medium Risk (06/26/2023)  Social Connections: Socially Isolated (02/13/2024)  Tobacco Use: Medium Risk (02/16/2024)  Health Literacy: Adequate Health Literacy (07/25/2023)     Readmission Risk Interventions     No data to display

## 2024-02-18 NOTE — Care Management Important Message (Signed)
 Important Message  Patient Details  Name: Steven [name] MRN: [id number] Date of Birth: Feb 20, 1956   Important Message Given:  Yes - Medicare IM     Claretta Deed 02/18/2024, 4:06 PM

## 2024-02-18 NOTE — Care Management Important Message (Signed)
 Important Message  Patient Details  Name: Steven [name] MRN: [id number] Date of Birth: 13-Oct-1955   Important Message Given:  Yes - Medicare IM     Claretta Deed 02/18/2024, 4:05 PM

## 2024-02-18 NOTE — Progress Notes (Addendum)
 LVAD Coordinator Rounding Note:  Admitted 02/13/24 to HF service due to GIB.   HM3 LVAD implanted on 03/08/23 by Dr Lucas under DT criteria.  Pt presented to VAD clinic yesterday for driveline care and labs. Pt states that he has not being feeling well recently (nausea & poor appetite) and reports lightheadedness at times along with fatigue. CBC resulted with 4 gram drop in hgb over the past few weeks. Decision made to direct admit pt for GI work up. Stool is yellow. Fecal occult negative.    Pt lying in bed on my arrival. Denies complaints this morning.   EGD 8/22 & colonoscopy 8/23 with no source of bleeding found. 4 polyps biopsied during colonoscopy.   100+ PI events and frequently ramping to low speed limit on interrogation this morning. BP soft. Discussed with [place] Lee NP- will plan to administer 500 cc LR bolus. Continued to have speed drops after 500 cc. Additional 1 L LR given per Dr Bensimhon.   Pt will need note for work at discharge. VAD clinic f/u appt 02/26/24 at 10:00.   Vital signs: Temp: 98.7 HR: 81 paced Doppler Pressure: 70 Auto BP: 85/72 (78) O2 Sat: 97% on RA Wt: 151.4>147.2>147.7>143.3  lbs    LVAD interrogation reveals:  Speed: 5600 Flow: 4.8 Power: 4.1 w  PI: 3.1  Alarms: none  Events: 100+ with frequent ramp down to low speed Hematocrit: 20-DO NOT CHANGE  Fixed speed: 5600 Low speed limit: 5300   Drive Line: Existing VAD dressing CDI. Anchor secure. Next dressing change due 02/19/24 bedside nurse or VAD coordinator.  Labs:  LDH trend: 313>318>427  INR trend: 3.9>4.4>1.8>1.7  Hgb: 8.2>7.7>8.5>9.1  Anticoagulation Plan: -INR Goal: 2-2.5 -ASA Dose: off  Blood Products:   Device: -Medtronic -Therapies: ON  -VF > 200BMP  Arrythmias: hx afib  Infection:   Adverse Events on VAD:  Plan/Recommendations:  1. Please page VAD coordinator with any equipment issues or driveline problems. 2. Weekly dressing change by bedside nurse or VAD  coordinator.  Isaiah Knoll RN VAD Coordinator  Office: [phone]  24/7 Pager: [phone]

## 2024-02-18 NOTE — Progress Notes (Signed)
 PHARMACY - ANTICOAGULATION CONSULT NOTE  Pharmacy Consult for warfarin Indication: LVAD HM3  No Known Allergies  Patient Measurements: Height: 5' 9 (175.3 cm) Weight: 65 kg (143 lb 4.8 oz) IBW/kg (Calculated) : 70.7  Vital Signs: Temp: 98.6 F (37 C) (08/25 1147) Temp Source: Oral (08/25 1147) BP: 81/71 (08/25 1200) Pulse Rate: 75 (08/25 1147)  Labs: Recent Labs    02/16/24 0209 02/17/24 0849 02/17/24 0941 02/18/24 0905  HGB 8.8* 9.1*  --  8.7*  HCT 26.9* 28.3*  --  26.8*  PLT 320 295  --  294  LABPROT 19.9*  --  20.4* 21.4*  INR 1.6*  --  1.7* 1.8*  CREATININE 0.98 1.10  --  1.04    Estimated Creatinine Clearance: 61.6 mL/min (by C-G formula based on SCr of 1.04 mg/dL).   Medical History: Past Medical History:  Diagnosis Date   Acute MI, anterior wall (HCC)    AICD (automatic cardioverter/defibrillator) present    CAD (coronary artery disease)    2D ECHO, 07/13/2011 - EF <25%, LV moderatelty dilated, LA moderately dilatedLEXISCAN, 12/14/2011 - moderate-severe perfusion defect seen in the basal anteroseptal, mid anterior, apicacl anterior, apical, apical inferior, and apical lateral regions, post-stress EF 25%, new EKG changes from baseline abnormalities   Cancer [facility])    Prostate   CHF (congestive heart failure) (HCC) 2012   Hypertension 08/08/2021   Inguinal hernia, left    Pneumonia    November 2023   Pre-diabetes     Medications:  Scheduled:   sodium chloride    Intravenous Once   atorvastatin   80 mg Oral Daily   docusate sodium   200 mg Oral Daily   feeding supplement  237 mL Oral BID BM   ferrous sulfate   325 mg Oral Q breakfast   finasteride   5 mg Oral Daily   pantoprazole   40 mg Oral BID   warfarin  2.5 mg Oral ONCE-1600   Warfarin - Pharmacist Dosing Inpatient   Does not apply q1600   Infusions:     Assessment: 79 YOM admitted with orthostasis and lightheadedness due to symptomatic anemia. INR on admission was 3.9, then increased up to 4.4  on 8/21. Patient given vitamin K  and x2 FFP on 8/21.  GI consulted and patient now s/p EGD and colonoscopy. No active bleeding seen on exam. INR trended down to 1.6 on 02/16/24, discussed with HF MD and warfarin resumed. Pharmacy consulted for warfarin dosing.  PTA Regimen: 2.5 mg on Monday and Friday, 5 mg EOD.  INR 1.8 slowly rising after warfarin 5mg  x2  Will resume PTA dosing and re-evaluate in am  No bleeding noted cbc stable   Goal of Therapy:  INR Goal 2.0-2.5 Monitor platelets by anticoagulation protocol: Yes   Plan:  Warfarin 2.5 mg x1 today Monitor INR, CBC, and s/sx of bleeding daily   Olam Chalk Pharm.D. CPP, BCPS Clinical Pharmacist [phone] 02/18/2024 12:41 PM

## 2024-02-18 NOTE — Anesthesia Postprocedure Evaluation (Signed)
 Anesthesia Post Note  Patient: Steven [name]  Procedure(s) Performed: EGD (ESOPHAGOGASTRODUODENOSCOPY)     Patient location during evaluation: Endoscopy Anesthesia Type: MAC Level of consciousness: awake and alert Pain management: pain level controlled Vital Signs Assessment: post-procedure vital signs reviewed and stable Respiratory status: spontaneous breathing, nonlabored ventilation, respiratory function stable and patient connected to nasal cannula oxygen Cardiovascular status: stable and blood pressure returned to baseline Postop Assessment: no apparent nausea or vomiting Anesthetic complications: no   There were no known notable events for this encounter.  Last Vitals:  Vitals:   02/18/24 0740 02/18/24 0800  BP: (!) 85/72 (!) 85/72  Pulse: 80   Resp: 16   Temp: 37.1 C   SpO2: 97%     Last Pain:  Vitals:   02/18/24 0740  TempSrc: Oral  PainSc:                  Natoshia Souter S

## 2024-02-19 ENCOUNTER — Other Ambulatory Visit: Payer: Self-pay

## 2024-02-19 ENCOUNTER — Other Ambulatory Visit (HOSPITAL_COMMUNITY): Payer: Self-pay

## 2024-02-19 ENCOUNTER — Ambulatory Visit (INDEPENDENT_AMBULATORY_CARE_PROVIDER_SITE_OTHER): Payer: Medicaid Other

## 2024-02-19 ENCOUNTER — Ambulatory Visit (HOSPITAL_COMMUNITY)

## 2024-02-19 DIAGNOSIS — I5042 Chronic combined systolic (congestive) and diastolic (congestive) heart failure: Secondary | ICD-10-CM | POA: Diagnosis not present

## 2024-02-19 LAB — CUP PACEART REMOTE DEVICE CHECK
Battery Remaining Longevity: 68 mo
Battery Voltage: 3 V
Brady Statistic AP VP Percent: 0.01 %
Brady Statistic AP VS Percent: 0.02 %
Brady Statistic AS VP Percent: 0.05 %
Brady Statistic AS VS Percent: 99.93 %
Brady Statistic RA Percent Paced: 0.02 %
Brady Statistic RV Percent Paced: 0.05 %
Date Time Interrogation Session: 20250826022825
HighPow Impedance: 42 Ohm
HighPow Impedance: 62 Ohm
Implantable Lead Connection Status: 753985
Implantable Lead Connection Status: 753985
Implantable Lead Implant Date: 20120906
Implantable Lead Implant Date: 20120906
Implantable Lead Location: 753859
Implantable Lead Location: 753860
Implantable Lead Model: 185
Implantable Lead Model: 5076
Implantable Lead Serial Number: 358872
Implantable Pulse Generator Implant Date: 20200831
Lead Channel Impedance Value: 323 Ohm
Lead Channel Impedance Value: 323 Ohm
Lead Channel Impedance Value: 380 Ohm
Lead Channel Pacing Threshold Amplitude: 0.75 V
Lead Channel Pacing Threshold Amplitude: 0.75 V
Lead Channel Pacing Threshold Pulse Width: 0.4 ms
Lead Channel Pacing Threshold Pulse Width: 0.4 ms
Lead Channel Sensing Intrinsic Amplitude: 1.5 mV
Lead Channel Sensing Intrinsic Amplitude: 1.5 mV
Lead Channel Sensing Intrinsic Amplitude: 1.625 mV
Lead Channel Sensing Intrinsic Amplitude: 1.625 mV
Lead Channel Setting Pacing Amplitude: 1.5 V
Lead Channel Setting Pacing Amplitude: 2.5 V
Lead Channel Setting Pacing Pulse Width: 0.4 ms
Lead Channel Setting Sensing Sensitivity: 0.3 mV
Zone Setting Status: 755011

## 2024-02-20 ENCOUNTER — Other Ambulatory Visit (HOSPITAL_COMMUNITY): Payer: Self-pay

## 2024-02-20 DIAGNOSIS — Z95811 Presence of heart assist device: Secondary | ICD-10-CM

## 2024-02-20 DIAGNOSIS — Z7901 Long term (current) use of anticoagulants: Secondary | ICD-10-CM

## 2024-02-20 LAB — SURGICAL PATHOLOGY

## 2024-02-21 ENCOUNTER — Other Ambulatory Visit (HOSPITAL_COMMUNITY): Payer: Self-pay

## 2024-02-21 ENCOUNTER — Ambulatory Visit (HOSPITAL_COMMUNITY): Payer: Self-pay | Admitting: Pharmacist

## 2024-02-21 ENCOUNTER — Ambulatory Visit (HOSPITAL_COMMUNITY)
Admission: RE | Admit: 2024-02-21 | Discharge: 2024-02-21 | Disposition: A | Discharge status: Home / Self Care | Source: Ambulatory Visit | Attending: Cardiology | Admitting: Cardiology

## 2024-02-21 ENCOUNTER — Ambulatory Visit: Payer: Self-pay | Admitting: Cardiovascular Disease

## 2024-02-21 VITALS — BP 76/65 | HR 79

## 2024-02-21 DIAGNOSIS — Z95811 Presence of heart assist device: Secondary | ICD-10-CM | POA: Diagnosis not present

## 2024-02-21 DIAGNOSIS — D631 Anemia in chronic kidney disease: Secondary | ICD-10-CM | POA: Insufficient documentation

## 2024-02-21 DIAGNOSIS — I255 Ischemic cardiomyopathy: Secondary | ICD-10-CM | POA: Insufficient documentation

## 2024-02-21 DIAGNOSIS — Z955 Presence of coronary angioplasty implant and graft: Secondary | ICD-10-CM | POA: Insufficient documentation

## 2024-02-21 DIAGNOSIS — I252 Old myocardial infarction: Secondary | ICD-10-CM | POA: Diagnosis not present

## 2024-02-21 DIAGNOSIS — Z79899 Other long term (current) drug therapy: Secondary | ICD-10-CM | POA: Diagnosis not present

## 2024-02-21 DIAGNOSIS — Z8546 Personal history of malignant neoplasm of prostate: Secondary | ICD-10-CM | POA: Diagnosis not present

## 2024-02-21 DIAGNOSIS — E785 Hyperlipidemia, unspecified: Secondary | ICD-10-CM | POA: Insufficient documentation

## 2024-02-21 DIAGNOSIS — I5043 Acute on chronic combined systolic (congestive) and diastolic (congestive) heart failure: Secondary | ICD-10-CM

## 2024-02-21 DIAGNOSIS — D649 Anemia, unspecified: Secondary | ICD-10-CM | POA: Diagnosis not present

## 2024-02-21 DIAGNOSIS — I340 Nonrheumatic mitral (valve) insufficiency: Secondary | ICD-10-CM | POA: Diagnosis not present

## 2024-02-21 DIAGNOSIS — I5042 Chronic combined systolic (congestive) and diastolic (congestive) heart failure: Secondary | ICD-10-CM | POA: Insufficient documentation

## 2024-02-21 DIAGNOSIS — I480 Paroxysmal atrial fibrillation: Secondary | ICD-10-CM | POA: Diagnosis not present

## 2024-02-21 DIAGNOSIS — Z7901 Long term (current) use of anticoagulants: Secondary | ICD-10-CM | POA: Insufficient documentation

## 2024-02-21 DIAGNOSIS — N1831 Chronic kidney disease, stage 3a: Secondary | ICD-10-CM | POA: Diagnosis not present

## 2024-02-21 DIAGNOSIS — Z86718 Personal history of other venous thrombosis and embolism: Secondary | ICD-10-CM | POA: Diagnosis not present

## 2024-02-21 DIAGNOSIS — I130 Hypertensive heart and chronic kidney disease with heart failure and stage 1 through stage 4 chronic kidney disease, or unspecified chronic kidney disease: Secondary | ICD-10-CM | POA: Insufficient documentation

## 2024-02-21 DIAGNOSIS — I5084 End stage heart failure: Secondary | ICD-10-CM | POA: Diagnosis not present

## 2024-02-21 DIAGNOSIS — I2510 Atherosclerotic heart disease of native coronary artery without angina pectoris: Secondary | ICD-10-CM | POA: Diagnosis not present

## 2024-02-21 LAB — CBC WITH DIFFERENTIAL/PLATELET
Abs Immature Granulocytes: 0.05 K/uL (ref 0.00–0.07)
Basophils Absolute: 0.1 K/uL (ref 0.0–0.1)
Basophils Relative: 1 %
Eosinophils Absolute: 0.1 K/uL (ref 0.0–0.5)
Eosinophils Relative: 1 %
HCT: 28.6 % — ABNORMAL LOW (ref 39.0–52.0)
Hemoglobin: 9.1 g/dL — ABNORMAL LOW (ref 13.0–17.0)
Immature Granulocytes: 1 %
Lymphocytes Relative: 12 %
Lymphs Abs: 1.2 K/uL (ref 0.7–4.0)
MCH: 30.7 pg (ref 26.0–34.0)
MCHC: 31.8 g/dL (ref 30.0–36.0)
MCV: 96.6 fL (ref 80.0–100.0)
Monocytes Absolute: 0.9 K/uL (ref 0.1–1.0)
Monocytes Relative: 9 %
Neutro Abs: 8 K/uL — ABNORMAL HIGH (ref 1.7–7.7)
Neutrophils Relative %: 76 %
Platelets: 269 K/uL (ref 150–400)
RBC: 2.96 MIL/uL — ABNORMAL LOW (ref 4.22–5.81)
RDW: 17.7 % — ABNORMAL HIGH (ref 11.5–15.5)
WBC: 10.2 K/uL (ref 4.0–10.5)
nRBC: 0 % (ref 0.0–0.2)

## 2024-02-21 LAB — TECHNOLOGIST SMEAR REVIEW
Clinical Information: ABNORMAL
Plt Morphology: NORMAL

## 2024-02-21 LAB — COMPREHENSIVE METABOLIC PANEL WITH GFR
ALT: 31 U/L (ref 0–44)
AST: 39 U/L (ref 15–41)
Albumin: 3.1 g/dL — ABNORMAL LOW (ref 3.5–5.0)
Alkaline Phosphatase: 80 U/L (ref 38–126)
Anion gap: 9 (ref 5–15)
BUN: 13 mg/dL (ref 8–23)
CO2: 20 mmol/L — ABNORMAL LOW (ref 22–32)
Calcium: 9 mg/dL (ref 8.9–10.3)
Chloride: 105 mmol/L (ref 98–111)
Creatinine, Ser: 1.09 mg/dL (ref 0.61–1.24)
GFR, Estimated: >60 mL/min (ref >60)
Glucose, Bld: 112 mg/dL — ABNORMAL HIGH (ref 70–99)
Potassium: 4.1 mmol/L (ref 3.5–5.1)
Sodium: 134 mmol/L — ABNORMAL LOW (ref 135–145)
Total Bilirubin: 1 mg/dL (ref 0.0–1.2)
Total Protein: 7.7 g/dL (ref 6.5–8.1)

## 2024-02-21 LAB — CBC
HCT: 29.8 % — ABNORMAL LOW (ref 39.0–52.0)
Hemoglobin: 9.7 g/dL — ABNORMAL LOW (ref 13.0–17.0)
MCH: 30.9 pg (ref 26.0–34.0)
MCHC: 32.6 g/dL (ref 30.0–36.0)
MCV: 94.9 fL (ref 80.0–100.0)
Platelets: 300 K/uL (ref 150–400)
RBC: 3.14 MIL/uL — ABNORMAL LOW (ref 4.22–5.81)
RDW: 17.8 % — ABNORMAL HIGH (ref 11.5–15.5)
WBC: 10.9 K/uL — ABNORMAL HIGH (ref 4.0–10.5)
nRBC: 0 % (ref 0.0–0.2)

## 2024-02-21 LAB — PROTIME-INR
INR: 1.8 — ABNORMAL HIGH (ref 0.8–1.2)
Prothrombin Time: 21.4 s — ABNORMAL HIGH (ref 11.4–15.2)

## 2024-02-21 LAB — SEDIMENTATION RATE: Sed Rate: 71 mm/h — ABNORMAL HIGH (ref 0–16)

## 2024-02-21 LAB — RETICULOCYTES
Immature Retic Fract: 17.2 % — ABNORMAL HIGH (ref 2.3–15.9)
RBC.: 2.91 MIL/uL — ABNORMAL LOW (ref 4.22–5.81)
Retic Count, Absolute: 137.6 K/uL (ref 19.0–186.0)
Retic Ct Pct: 4.7 % — ABNORMAL HIGH (ref 0.4–3.1)

## 2024-02-21 LAB — LACTATE DEHYDROGENASE: LDH: 285 U/L — ABNORMAL HIGH (ref 98–192)

## 2024-02-21 LAB — C-REACTIVE PROTEIN: CRP: 5.8 mg/dL — ABNORMAL HIGH (ref <1.0)

## 2024-02-21 MED ORDER — DOXYCYCLINE HYCLATE 100 MG PO CAPS
100.0000 mg | ORAL_CAPSULE | Freq: Two times a day (BID) | ORAL | 0 refills | Status: AC
  Filled 2024-02-21: qty 14, 7d supply, fill #0

## 2024-02-21 NOTE — Progress Notes (Signed)
 Patient presents for nurse visit for work clearance? changed to MD visit in VAD Clinic today. Reports no problems with VAD equipment or concerns with drive line.   Pt tells me that he is weak and hasn't been doing much at home. Pt continues to report no appetite. He tells me that he is forcing himself to eat. BP is low today. He confirms that he stopped his Losartan . Made pt MD visit as his symptoms have not change since hospital.  He was admitted last week and d/c on 8/26 for GIB workup. All tests were negative. No bleeding found in EGD or colonoscopy. H. Pylori was negative. Fecal occult was negative. Hgb is up to 9.7 today.  Dr Zenaida into see pt. Pt very orthostatic. Per Dr Zenaida 20g PIV started in left wrist. 1 liter of LR administered over 2 hours. Per DR Zenaida ESR and CRP along with blood smear sent. Will order Ct chest, abd, pelvis. Pt is coughing in clinic today. Course of Doxy ordered per DR Zenaida for possible bronchitis/pneumonia. Pt felt much better after this and BP was improved.   Vital Signs:   Automatic BP: 72/53 (62) HR: 79 SPO2: 98%   Weight: 168.2 lb w/ eqt Last weight: 172.2 lb w/ eqt  Orthostatics pre fluid bolus: Lying: 95/54 (66) Speed: 5600 Flow: 4.7 Power: 4.2 w PI: 3.0  Sitting: 94/84 (89) Speed: 5600 Flow: 5 Power: 4.2 w PI: 2.2  Standing: 76/65 (71) Speed: 5600 Flow: 3.7 Power: 4.2 w PI: 8.1  Orthostatics post fluid bolus: Lying: 106/77 (86) Speed: 5400 Flow: 4.1 Power: 4.2 w PI: 3.6  Sitting: 99/88 (92) Speed: 5400 Flow: 4.2 Power: 3.9 w PI: 3.3  Standing: 103/72 (82) Speed: 5400 Flow: 3.9 Power: 3.9 w PI: 3.6  VAD Indication: Destination Therapy - pt preference, social issues   VAD interrogation & Equipment Management: Speed: 5600 Flow: 4.7 Power: 4.3 w PI: 3.0   Alarms: none Events: 80+  Fixed speed: 5600 Low speed limit:  5300   Primary Controller:  Replace back up battery in 23 months. Back up controller:  Not  present  Annual Equipment Maintenance on UBC/PM was performed on 03/08/23.    I reviewed the LVAD parameters from today and compared the results to the patient's prior recorded data. LVAD interrogation was NEGATIVE for significant power changes, NEGATIVE for clinical alarms and STABLE for PI events/speed drops. No programming changes were made and pump is functioning within specified parameters. Pt is performing daily controller and system monitor self tests along with completing weekly and monthly maintenance for LVAD equipment.   LVAD equipment check completed and is in good working order. Back-up equipment NOT present. Charged back up battery and performed self-test on equipment.    Exit Site Care: CDI. Due next Tuesday.  Significant Events on VAD Support:   Device: Medtronic Therapies: on VF 200 BPM Last check: 11/20/23  BP & Labs:  MAP 92 - Doppler is reflecting MAP   Hgb 9.1 - No S/S of bleeding. Specifically denies melena/BRBPR or nosebleeds.   LDH stable at 285 with established baseline of 230-290. Denies tea-colored urine. No power elevations noted on interrogation.   Plan: Start Doxycycline  100 mg bid for 7 days Return next week for dressing change and 2 weeks for full visit.  Lauraine Ip RN VAD Coordinator  Office: [phone]  24/7 Pager: [phone]

## 2024-02-22 LAB — PATHOLOGIST SMEAR REVIEW

## 2024-02-26 ENCOUNTER — Encounter (HOSPITAL_COMMUNITY): Payer: Self-pay | Admitting: Unknown Physician Specialty

## 2024-02-26 ENCOUNTER — Encounter (HOSPITAL_COMMUNITY): Admitting: Cardiology

## 2024-02-26 ENCOUNTER — Ambulatory Visit (HOSPITAL_COMMUNITY)

## 2024-02-26 ENCOUNTER — Telehealth (HOSPITAL_COMMUNITY): Payer: Self-pay | Admitting: Licensed Clinical Social Worker

## 2024-02-26 ENCOUNTER — Ambulatory Visit (HOSPITAL_COMMUNITY)
Admission: RE | Admit: 2024-02-26 | Discharge: 2024-02-26 | Disposition: A | Discharge status: Home / Self Care | Source: Ambulatory Visit | Attending: Cardiology | Admitting: Cardiology

## 2024-02-26 ENCOUNTER — Encounter (HOSPITAL_COMMUNITY): Payer: Self-pay

## 2024-02-26 DIAGNOSIS — Z4509 Encounter for adjustment and management of other cardiac device: Secondary | ICD-10-CM | POA: Insufficient documentation

## 2024-02-26 DIAGNOSIS — Z95811 Presence of heart assist device: Secondary | ICD-10-CM | POA: Diagnosis not present

## 2024-02-26 NOTE — Progress Notes (Signed)
 Patient presents for nurse visit for work clearance?, BP check, and VAD interrogation. Pt does not have any concerns with drive line or VAD equipment.   Pt tells me that he is feeling much better today. Pt was seen in VAD clinic on Thursday by Dr Zenaida and was still feeling poorly. Pt was given 1 liter of LR, speed was dropped to 5400 and started on Doxycycline  for possible pneumonia. Pt tells me that he has 1 day left of his antibiotics. He states that he is feeling back to his baseline and is asking to go back to work.  Vital Signs:   Automatic BP: 101/78 (87) HR: 79 SPO2: 98%   Weight: 168.2 lb w/ eqt Last weight: 172.2 lb w/ eqt   VAD Indication: Destination Therapy - pt preference, social issues   VAD interrogation & Equipment Management: Speed: 5400 Flow: 4.3 Power: 3.9 w PI: 3.1   Alarms: none Events: 10 today; 50+ yesterday  Fixed speed: 5400 Low speed limit:  5100   Primary Controller:  Replace back up battery in 23 months. Back up controller:  Not present  Annual Equipment Maintenance on UBC/PM was performed on 03/08/23.    I reviewed the LVAD parameters from today and compared the results to the patient's prior recorded data. LVAD interrogation was NEGATIVE for significant power changes, NEGATIVE for clinical alarms and STABLE for PI events/speed drops. No programming changes were made and pump is functioning within specified parameters. Pt is performing daily controller and system monitor self tests along with completing weekly and monthly maintenance for LVAD equipment.   LVAD equipment check completed and is in good working order. Back-up equipment NOT present. Charged back up battery and performed self-test on equipment.    Exit Site Care: CDI. Due next Tuesday.  Significant Events on VAD Support:   Device: Medtronic Therapies: on VF 200 BPM Last check: 11/20/23  BP & Labs:  MAP 88 - Doppler is reflecting MAP   Hgb 9.1 - No S/S of bleeding. Specifically  denies melena/BRBPR or nosebleeds.   LDH stable at 285 with established baseline of 230-290. Denies tea-colored urine. No power elevations noted on interrogation.   Plan: Return next week for dressing change/INR  Lauraine Ip RN VAD Coordinator  Office: [phone]  24/7 Pager: [phone]

## 2024-02-26 NOTE — Progress Notes (Signed)
 LVAD CLINIC NOTE  PCP: Ziglar, Susan K, MD HF doc: DB  Chief complaint: HF  HPI:  Steven [name] is a 68 y.o. 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 Underwent HM-3 VAD placement 0n 03/08/23   Originally from [place], suffered a large out of hospital anterior wall myocardial infarction January 2012.  He presented after a three-hour delay to the hospital and was taken emergently for cath with DES to LAD. EF was 35-40% at time of MI. Unfortunately, did not have recovery of EF. C/w progressive decline in systolic function over the years. Ultimately underwent ICD.    Admitted on 02/26/23 w/ NYHA Class IV symptoms and low output. Echo showed EF < 20%, RV mildly reduced. RHC showed with low output. CI 2.1. Calcified pericardium (no H/o TB). Underwent HMIII LVAD implant on 03/08/23. Intra-op TEE LVEF 15%, RV normal.  Post op course was c/b afib w/ RVR, requiring IV amiodarone . Converted back to NSR.  Seen on 05/22/23 for unscheduled f/u due to low flow alarms on VAD. Pacer reprogrammed to let intrinsic rate come through (was RV pacing at 80). Feels much better. Walking 40 mins every day in the park. Denies orthopnea or PND. No fevers, chills or problems with driveline. No bleeding, melena or neuro symptoms. No VAD alarms. Taking all meds as prescribed.   Admitted 01/2024 after asymptomatic low flow alarm noted while in bed. Seen in LVAD clinic at which point it time he was orthostatic, dizzy, and hemoglobin came back at 8.4. Was admitted for GI workup which was largely negative. Remained dry during hospital visit, received multiple fluid boluses.   Overall continues to feel weak, dizzy, and lightheaded with movement. He has not had much of an appetite since he left the hospital, and does report an ongoing cough. Potentially had some viral PNA previously, though was COVID  negative. His LVAD dropped to his low speed multiple times during conversation and he was profoundly orthostatic. No localizing abdominal symptoms. No further bleeding noted, no changes to driveline.        VAD Indication: Destination Therapy - pt preference, social issues   VAD interrogation & Equipment Management: Speed: 5600 Flow: 4.7 Power: 4.3 w PI: 3.0   Alarms: none Events: 80+   Fixed speed: 5600 Low speed limit:  5300   Primary Controller:  Replace back up battery in 23 months. Back up controller:  Not present   Annual Equipment Maintenance on UBC/PM was performed on 03/08/23.    I reviewed the LVAD parameters from today, and compared the results to the patient's prior recorded data.  Given extremely frequent PI events, multiple low speed drops, I reduced the speed to 5400RPM.  The LVAD is functioning within specified parameters.  The patient performs LVAD self-test daily.  LVAD interrogation was negative for any significant power changes, alarms or PI events/speed drops.  LVAD equipment check completed and is in good working order.  Back-up equipment present.   LVAD education done on emergency procedures and precautions and reviewed exit site care.    LVAD equipment check completed and is in good working order. Back-up equipment NOT present. Charged back up battery and performed self-test on equipment.   Past Medical History:  Diagnosis Date   Acute MI, anterior wall (HCC)    AICD (automatic cardioverter/defibrillator) present    CAD (coronary artery disease)  2D ECHO, 07/13/2011 - EF <25%, LV moderatelty dilated, LA moderately dilatedLEXISCAN, 12/14/2011 - moderate-severe perfusion defect seen in the basal anteroseptal, mid anterior, apicacl anterior, apical, apical inferior, and apical lateral regions, post-stress EF 25%, new EKG changes from baseline abnormalities   Cancer [facility])    Prostate   CHF (congestive heart failure) (HCC) 2012   Hypertension 08/08/2021    Inguinal hernia, left    Pneumonia    November 2023   Pre-diabetes     Current Outpatient Medications  Medication Sig Dispense Refill   doxycycline  (VIBRAMYCIN ) 100 MG capsule Take 1 capsule (100 mg total) by mouth 2 (two) times daily for 7 days. 14 capsule 0   acetaminophen  (TYLENOL ) 500 MG tablet Take 1,000 mg by mouth daily as needed for mild pain (pain score 1-3) or moderate pain (pain score 4-6).     atorvastatin  (LIPITOR ) 80 MG tablet Take 1 tablet (80 mg total) by mouth daily. 90 tablet 3   docusate sodium  (COLACE) 100 MG capsule Take 2 capsules (200 mg total) by mouth daily. (Patient not taking: Reported on 02/13/2024) 100 capsule 0   ferrous sulfate  325 (65 FE) MG tablet Take 1 tablet (325 mg total) by mouth daily with breakfast. 30 tablet 3   finasteride  (PROSCAR ) 5 MG tablet Take 1 tablet (5 mg total) by mouth daily. 90 tablet 3   Multiple Vitamins-Minerals (CERTAVITE/ANTIOXIDANTS) TABS Take 1 tablet by mouth daily. 130 tablet 0   pantoprazole  (PROTONIX ) 40 MG tablet Take 1 tablet (40 mg total) by mouth daily. 30 tablet 6   tamsulosin  (FLOMAX ) 0.4 MG CAPS capsule Take 1 capsule (0.4 mg total) by mouth daily. 90 capsule 0   traZODone  (DESYREL ) 50 MG tablet Take 1 tablet (50 mg total) by mouth at bedtime as needed for sleep. 30 tablet 3   warfarin (COUMADIN ) 2.5 MG tablet Take 2.5 mg (1 tablet) every Mon,Wed, Fri; 5 mg (2 tablets) all other days; or as directed by heart failure clinic 60 tablet 6   No current facility-administered medications for this encounter.    Patient has no known allergies.  REVIEW OF SYSTEMS: All systems negative except as listed in HPI, PMH and Problem list.  Vital Signs:  Automatic BP: 72/53 (62) HR: 79 SPO2: 98%   Weight: 168.2 lb w/ eqt Last weight: 172.2 lb w/ eqt   Orthostatics pre fluid bolus: Lying: 95/54 (66) Speed: 5600 Flow: 4.7 Power: 4.2 w PI: 3.0   Sitting: 94/84 (89) Speed: 5600 Flow: 5 Power: 4.2 w PI: 2.2   Standing:  76/65 (71) Speed: 5600 Flow: 3.7 Power: 4.2 w PI: 8.1   Orthostatics post fluid bolus: Lying: 106/77 (86) Speed: 5400 Flow: 4.1 Power: 4.2 w PI: 3.6   Sitting: 99/88 (92) Speed: 5400 Flow: 4.2 Power: 3.9 w PI: 3.3   Standing: 103/72 (82) Speed: 5400 Flow: 3.9 Power: 3.9 w PI: 3.6  Vitals:   02/21/24 1020 02/21/24 1047  BP: (!) 72/53 (!) 76/65  Pulse: 79   SpO2: 98%     Physical Exam: GENERAL: NAD, well appearing PULM:  Normal work of breathing, CTAB CARDIAC:  JVP: flat        LVAD hum, no edema ABDOMEN: Soft, mild epigastric tenderness, otherwise normal, non-distended. Driveline site c/d/I, dressing and anchor in place NEUROLOGIC: Patient is oriented x3 with no focal or lateralizing neurologic deficits.    ASSESSMENT AND PLAN:  Chronic combined systolic and diastolic heart failure - Due to ICM with anterior MI in 2012 - Echo  02/27/23: EF <20%, LV with GHK, RV mildly reduced, GIIDD, LA mod dilated, mild MR.  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 GDMT limited by renal function and hypotension.  - S/P HM3 LVAD implant 03/08/23.   - NYHA Class I from a HF standpoint, hypovolemic - Off BP meds, orthostatic today   Anemia/reduced appetite: Unclear etiology, PSA negative, no localizing symptoms besides recent URI symptoms.  - Potentially superimposed PNA, will treat with 1 week doxycycline , WBC mildly elevated - Hgb stable despite hypovolemia - 1L LR bolus with improvement in orthostatic symptoms - Decreased LVAD speed to 5400 RPM as below - CRP, ESR, reticulocytes ordered - CT chest/abdomen/pelvis given B symptoms and worsening oral intake  VAD  - RAMP echo 3/25 speed turned down from 5700 -> 5600 - LDH 285, stable - Hgb 9.7, improved from hospital stay - DL ok - Speed decreased to 5400 RPM on 8/28 for frequent spin down events - INR 1.8 Goal 2.0-3.0 discussed dosing with pharmacy  CAD:  - h/o large anterior MI 2012 treated w/ DES to LAD.  - No  s/s angina - off asa as above.  - Continue statin   CKD stage 3: Improved - cardiorenal, resolved w/ LVAD support - Scr stable at 1.09   H/o VT s/p Medtronic ICD: likely scar mediated - No recent VT - off amio  Paroxysmal Atrial fibrillation:  - Remains in NSR. On warfarin  - INR management as above  I spent a total of 55 minutes today: 1) reviewing the patient's medical records including previous charts, labs and recent notes from other providers; 2) examining the patient and counseling them on their medical issues/explaining the plan of care; 3) adjusting meds as needed and 4) ordering lab work or other needed tests.    Morene JINNY Brownie, MD  5:46 AM

## 2024-02-26 NOTE — Telephone Encounter (Signed)
 CSW called pt to discuss financial concerns as pt was told to stop working the past few weeks- pt was written back into work today so unable to speak- will return CSW call tomorrow.  Andriette HILARIO Leech, LCSW Clinical Social Worker [facility] Desk#: [phone] Cell#: [phone]

## 2024-02-29 ENCOUNTER — Ambulatory Visit (HOSPITAL_COMMUNITY)
Admission: RE | Admit: 2024-02-29 | Discharge: 2024-02-29 | Disposition: A | Discharge status: Home / Self Care | Source: Ambulatory Visit | Attending: Cardiology | Admitting: Cardiology

## 2024-02-29 ENCOUNTER — Other Ambulatory Visit (HOSPITAL_COMMUNITY): Payer: Self-pay | Admitting: *Deleted

## 2024-02-29 DIAGNOSIS — I700 Atherosclerosis of aorta: Secondary | ICD-10-CM | POA: Diagnosis not present

## 2024-02-29 DIAGNOSIS — I517 Cardiomegaly: Secondary | ICD-10-CM | POA: Insufficient documentation

## 2024-02-29 DIAGNOSIS — Z8546 Personal history of malignant neoplasm of prostate: Secondary | ICD-10-CM | POA: Diagnosis present

## 2024-02-29 DIAGNOSIS — Z95811 Presence of heart assist device: Secondary | ICD-10-CM

## 2024-02-29 DIAGNOSIS — N400 Enlarged prostate without lower urinary tract symptoms: Secondary | ICD-10-CM | POA: Diagnosis not present

## 2024-02-29 DIAGNOSIS — I2510 Atherosclerotic heart disease of native coronary artery without angina pectoris: Secondary | ICD-10-CM | POA: Diagnosis not present

## 2024-02-29 DIAGNOSIS — R918 Other nonspecific abnormal finding of lung field: Secondary | ICD-10-CM | POA: Diagnosis not present

## 2024-02-29 DIAGNOSIS — J439 Emphysema, unspecified: Secondary | ICD-10-CM | POA: Diagnosis not present

## 2024-02-29 DIAGNOSIS — K4090 Unilateral inguinal hernia, without obstruction or gangrene, not specified as recurrent: Secondary | ICD-10-CM | POA: Insufficient documentation

## 2024-02-29 DIAGNOSIS — Z950 Presence of cardiac pacemaker: Secondary | ICD-10-CM | POA: Insufficient documentation

## 2024-02-29 DIAGNOSIS — Z7901 Long term (current) use of anticoagulants: Secondary | ICD-10-CM

## 2024-02-29 MED ORDER — IOHEXOL 300 MG/ML  SOLN
100.0000 mL | Freq: Once | INTRAMUSCULAR | Status: AC | PRN
  Administered 2024-02-29: 100 mL via INTRAVENOUS

## 2024-03-03 ENCOUNTER — Encounter (HOSPITAL_COMMUNITY): Admitting: Internal Medicine

## 2024-03-04 ENCOUNTER — Ambulatory Visit (HOSPITAL_COMMUNITY)
Admission: RE | Admit: 2024-03-04 | Discharge: 2024-03-04 | Disposition: A | Discharge status: Home / Self Care | Source: Ambulatory Visit | Attending: Cardiology | Admitting: Cardiology

## 2024-03-04 ENCOUNTER — Ambulatory Visit (HOSPITAL_COMMUNITY): Payer: Self-pay | Admitting: Pharmacist

## 2024-03-04 ENCOUNTER — Encounter (HOSPITAL_COMMUNITY): Admitting: Cardiology

## 2024-03-04 DIAGNOSIS — Z7901 Long term (current) use of anticoagulants: Secondary | ICD-10-CM | POA: Diagnosis not present

## 2024-03-04 DIAGNOSIS — Z4801 Encounter for change or removal of surgical wound dressing: Secondary | ICD-10-CM | POA: Diagnosis present

## 2024-03-04 DIAGNOSIS — Z95811 Presence of heart assist device: Secondary | ICD-10-CM | POA: Diagnosis not present

## 2024-03-04 LAB — CBC
HCT: 27.3 % — ABNORMAL LOW (ref 39.0–52.0)
Hemoglobin: 8.7 g/dL — ABNORMAL LOW (ref 13.0–17.0)
MCH: 31.8 pg (ref 26.0–34.0)
MCHC: 31.9 g/dL (ref 30.0–36.0)
MCV: 99.6 fL (ref 80.0–100.0)
Platelets: 208 K/uL (ref 150–400)
RBC: 2.74 MIL/uL — ABNORMAL LOW (ref 4.22–5.81)
RDW: 17.2 % — ABNORMAL HIGH (ref 11.5–15.5)
WBC: 9 K/uL (ref 4.0–10.5)
nRBC: 0 % (ref 0.0–0.2)

## 2024-03-04 LAB — PROTIME-INR
INR: 2 — ABNORMAL HIGH (ref 0.8–1.2)
Prothrombin Time: 23.7 s — ABNORMAL HIGH (ref 11.4–15.2)

## 2024-03-04 NOTE — Progress Notes (Signed)
 Patient presents for dressing change in VAD Clinic today alone along with CBC, INR. Reports no problems with VAD equipment or concerns with drive line.     Exit Site Care: Drive line is being maintained once a week by VAD coordinators. Existing VAD dressing removed and site care performed using sterile technique. Drive line exit site cleaned with Chlora prep applicators x 2, allowed to dry, and Sorbaview dressing with Silverlon patch applied. Exit site healed and incorporated, the velour is exposed approx 1 at exit site. No redness, tenderness, drainage, foul odor or rash noted. Drive line anchor re-applied. Pt denies fever or chills. Pt has sufficient kits at home.   Plan:  Return to clinic in 1 week for dressing change  Lauraine Ip RN, BSN VAD Coordinator 24/7 Pager [phone]

## 2024-03-05 ENCOUNTER — Other Ambulatory Visit (HOSPITAL_COMMUNITY): Payer: Self-pay

## 2024-03-06 ENCOUNTER — Other Ambulatory Visit (HOSPITAL_COMMUNITY): Payer: Self-pay

## 2024-03-06 MED ORDER — TAMSULOSIN HCL 0.4 MG PO CAPS
0.4000 mg | ORAL_CAPSULE | Freq: Every day | ORAL | 3 refills | Status: AC
  Filled 2024-04-26: qty 90, 90d supply, fill #0
  Filled 2024-07-26: qty 90, 90d supply, fill #1

## 2024-03-11 ENCOUNTER — Ambulatory Visit (HOSPITAL_COMMUNITY)
Admission: RE | Admit: 2024-03-11 | Discharge: 2024-03-11 | Disposition: A | Discharge status: Home / Self Care | Source: Ambulatory Visit | Attending: Internal Medicine | Admitting: Internal Medicine

## 2024-03-11 ENCOUNTER — Ambulatory Visit (HOSPITAL_COMMUNITY): Payer: Self-pay | Admitting: Cardiology

## 2024-03-11 ENCOUNTER — Telehealth (HOSPITAL_COMMUNITY): Payer: Self-pay

## 2024-03-11 DIAGNOSIS — I5043 Acute on chronic combined systolic (congestive) and diastolic (congestive) heart failure: Secondary | ICD-10-CM

## 2024-03-11 DIAGNOSIS — Z4801 Encounter for change or removal of surgical wound dressing: Secondary | ICD-10-CM | POA: Insufficient documentation

## 2024-03-11 DIAGNOSIS — Z95811 Presence of heart assist device: Secondary | ICD-10-CM | POA: Diagnosis not present

## 2024-03-11 DIAGNOSIS — R222 Localized swelling, mass and lump, trunk: Secondary | ICD-10-CM | POA: Diagnosis not present

## 2024-03-11 LAB — COMPREHENSIVE METABOLIC PANEL WITH GFR
ALT: 13 U/L (ref 0–44)
AST: 30 U/L (ref 15–41)
Albumin: 2.6 g/dL — ABNORMAL LOW (ref 3.5–5.0)
Alkaline Phosphatase: 81 U/L (ref 38–126)
Anion gap: 10 (ref 5–15)
BUN: 6 mg/dL — ABNORMAL LOW (ref 8–23)
CO2: 19 mmol/L — ABNORMAL LOW (ref 22–32)
Calcium: 8.2 mg/dL — ABNORMAL LOW (ref 8.9–10.3)
Chloride: 107 mmol/L (ref 98–111)
Creatinine, Ser: 1.23 mg/dL (ref 0.61–1.24)
GFR, Estimated: >60 mL/min (ref >60)
Glucose, Bld: 93 mg/dL (ref 70–99)
Potassium: 3.1 mmol/L — ABNORMAL LOW (ref 3.5–5.1)
Sodium: 136 mmol/L (ref 135–145)
Total Bilirubin: 1.1 mg/dL (ref 0.0–1.2)
Total Protein: 6.5 g/dL (ref 6.5–8.1)

## 2024-03-11 LAB — IRON AND TIBC
Iron: 23 ug/dL — ABNORMAL LOW (ref 45–182)
Saturation Ratios: 11 % — ABNORMAL LOW (ref 17.9–39.5)
TIBC: 209 ug/dL — ABNORMAL LOW (ref 250–450)
UIBC: 186 ug/dL

## 2024-03-11 LAB — CBC
HCT: 27 % — ABNORMAL LOW (ref 39.0–52.0)
Hemoglobin: 8.6 g/dL — ABNORMAL LOW (ref 13.0–17.0)
MCH: 31.4 pg (ref 26.0–34.0)
MCHC: 31.9 g/dL (ref 30.0–36.0)
MCV: 98.5 fL (ref 80.0–100.0)
Platelets: 225 K/uL (ref 150–400)
RBC: 2.74 MIL/uL — ABNORMAL LOW (ref 4.22–5.81)
RDW: 16.2 % — ABNORMAL HIGH (ref 11.5–15.5)
WBC: 8.9 K/uL (ref 4.0–10.5)
nRBC: 0 % (ref 0.0–0.2)

## 2024-03-11 LAB — FOLATE: Folate: >20 ng/mL (ref >5.9)

## 2024-03-11 LAB — VITAMIN B12: Vitamin B-12: 1730 pg/mL — ABNORMAL HIGH (ref 180–914)

## 2024-03-11 LAB — FERRITIN: Ferritin: 225 ng/mL (ref 24–336)

## 2024-03-11 NOTE — Progress Notes (Signed)
Remote ICD Transmission.

## 2024-03-11 NOTE — Progress Notes (Signed)
 Patient presents for dressing change. Pt does not have any concerns with drive line or VAD equipment.   Pt tells me that he continues to feel poorly reporting shortness of breath and bilateral lower extremity edema. Pt states  it's hard to explain but I have not felt myself in weeks.  Pt continues to work and states he never feels recovered even when he is off. He is wearing his compression socks but only on his left leg. Advised to wear bilaterally.  He was admitted on 8/26 for GIB workup. All tests were negative. No bleeding found in EGD or colonoscopy. H. Pylori was negative. Fecal occult was negative. Hgb is up to 8.7 today. Pt discharged with oral ferrous sulfate .  Seen in VAD Clinic 8/28 with significant orthostatic hypotension. Speed was reduced to 5400 and 1L of LR received. Pt placed on 1 week of Doxycycline  for suspected bronchitis/pneumonia. CT chest abdomen pelvis ordered at this visit and results reviewed today with Dr. Cherrie. Orders received for STAT PET CT. Pt to see Dr. Zenaida next week for full visit. Labs obtained today.   IMPRESSION: 1. New, abnormally enlarged rounded left superior mediastinal structure measuring 2.0 x 2.0 cm with intermediate attenuation. Given note of the very prominent cisterna chyli, suspect that this is a patulous thoracic duct inserting at the venous angle although an abnormal lymph node is difficult to confidently exclude given this is not simple fluid attenuation. PET-CT could be helpful to assess for abnormal metabolic activity. Although also helpful, MRI would be contraindicated by LVAD. 2. Although this area is poorly assessed due to decompression of the esophagus and adjacent streak artifact from pacer control box, suspect some abnormal masslike soft tissue thickening at the left aspect of the superior esophagus just above the level of the aortic arch, measuring approximately 2.1 x 1.9 cm. This is new or at least significantly increased  compared to prior examination. This again may reflect a poorly visualized portion of a patulous thoracic duct, although nonspecific and could reflect esophageal neoplasm. 3. No other evidence of lymphadenopathy or metastatic disease in the chest, abdomen, or pelvis. 4. Prostatomegaly with median lobe hypertrophy. Biopsy markers and fiducials in the prostate. 5. Right inguinal hernia containing multiple loops of nonobstructed small bowel. 6. Emphysema. 7. Cardiomegaly and coronary artery disease.   Vital Signs:         Orthostatics: Lying: 107/88 (95) Speed: 5400 Flow: 4.2 Power: 3.9 w PI: 3.9   Sitting: 109/93 (99) Speed: 5400 Flow: 4.2 Power: 4.0 w PI: 3.7   Standing: 121/86 (98) Speed: 5400 Flow: 4.3 Power: 3.9 w PI: 3.4   VAD Indication: Destination Therapy - pt preference, social issues   VAD interrogation & Equipment Management: Speed: 5400 Flow: 4.3 Power: 3.9 w PI: 3.4   Alarms: none Events: 0-10  Fixed speed: 5400 Low speed limit:  5100   Primary Controller:  Replace back up battery in 23 months. Back up controller:  Not present  Annual Equipment Maintenance on UBC/PM was performed on 03/08/23.    I reviewed the LVAD parameters from today and compared the results to the patient's prior recorded data. LVAD interrogation was NEGATIVE for significant power changes, NEGATIVE for clinical alarms and STABLE for PI events/speed drops. No programming changes were made and pump is functioning within specified parameters. Pt is performing daily controller and system monitor self tests along with completing weekly and monthly maintenance for LVAD equipment.   LVAD equipment check completed and is in good working order.  Back-up equipment NOT present. Charged back up battery and performed self-test on equipment.    Exit Site Care: Drive line is being maintained once a week by VAD coordinators. Existing VAD dressing removed and site care performed using sterile  technique. Drive line exit site cleaned with Chlora prep applicators x 2, allowed to dry, and Sorbaview dressing with Silverlon patch applied. Exit site healed and incorporated, the velour is exposed approx 1 at exit site. No redness, tenderness, drainage, foul odor or rash noted. Drive line anchor re-applied. Pt denies fever or chills. Pt has sufficient kits at home.   Significant Events on VAD Support:   Device: Medtronic Therapies: on VF 200 BPM Last check: 02/19/24  BP & Labs:    Hgb 8.6 - No S/S of bleeding. Specifically denies melena/BRBPR or nosebleeds.    Plan: Return next week Tuesday for full visit with Dr. Zenaida Schuyler Lunger RN, BSN VAD Coordinator 24/7 Pager [phone]

## 2024-03-13 ENCOUNTER — Other Ambulatory Visit (HOSPITAL_COMMUNITY): Payer: Self-pay | Admitting: Unknown Physician Specialty

## 2024-03-13 ENCOUNTER — Ambulatory Visit (HOSPITAL_COMMUNITY)
Admission: RE | Admit: 2024-03-13 | Discharge: 2024-03-13 | Disposition: A | Discharge status: Home / Self Care | Source: Ambulatory Visit | Attending: Internal Medicine | Admitting: Internal Medicine

## 2024-03-13 ENCOUNTER — Telehealth (HOSPITAL_COMMUNITY): Payer: Self-pay | Admitting: Unknown Physician Specialty

## 2024-03-13 ENCOUNTER — Encounter (HOSPITAL_COMMUNITY)
Admission: RE | Admit: 2024-03-13 | Discharge: 2024-03-13 | Disposition: A | Discharge status: Home / Self Care | Source: Ambulatory Visit | Attending: Internal Medicine | Admitting: Internal Medicine

## 2024-03-13 ENCOUNTER — Other Ambulatory Visit: Payer: Self-pay

## 2024-03-13 DIAGNOSIS — K4090 Unilateral inguinal hernia, without obstruction or gangrene, not specified as recurrent: Secondary | ICD-10-CM | POA: Insufficient documentation

## 2024-03-13 DIAGNOSIS — J90 Pleural effusion, not elsewhere classified: Secondary | ICD-10-CM | POA: Insufficient documentation

## 2024-03-13 DIAGNOSIS — R600 Localized edema: Secondary | ICD-10-CM

## 2024-03-13 DIAGNOSIS — I700 Atherosclerosis of aorta: Secondary | ICD-10-CM | POA: Diagnosis not present

## 2024-03-13 DIAGNOSIS — Z95811 Presence of heart assist device: Secondary | ICD-10-CM | POA: Diagnosis not present

## 2024-03-13 DIAGNOSIS — I5043 Acute on chronic combined systolic (congestive) and diastolic (congestive) heart failure: Secondary | ICD-10-CM

## 2024-03-13 DIAGNOSIS — J9811 Atelectasis: Secondary | ICD-10-CM | POA: Diagnosis not present

## 2024-03-13 DIAGNOSIS — R222 Localized swelling, mass and lump, trunk: Secondary | ICD-10-CM | POA: Diagnosis present

## 2024-03-13 DIAGNOSIS — R918 Other nonspecific abnormal finding of lung field: Secondary | ICD-10-CM | POA: Insufficient documentation

## 2024-03-13 LAB — GLUCOSE, CAPILLARY: Glucose-Capillary: 99 mg/dL (ref 70–99)

## 2024-03-13 MED ORDER — AMOXICILLIN-POT CLAVULANATE 875-125 MG PO TABS
1.0000 | ORAL_TABLET | Freq: Two times a day (BID) | ORAL | 0 refills | Status: AC
  Filled 2024-03-13: qty 20, 10d supply, fill #0

## 2024-03-13 MED ORDER — FLUDEOXYGLUCOSE F - 18 (FDG) INJECTION
7.0000 | Freq: Once | INTRAVENOUS | Status: AC | PRN
  Administered 2024-03-13: 7.1 via INTRAVENOUS

## 2024-03-13 NOTE — Telephone Encounter (Signed)
 Received critical results call from [facility] regarding PET scan. They are noting possible DVT in left lower leg. Ultrasound to r/o DVT scheduled for 1100 am this morning.  Radiologist is also noting areas of concern for infectious/inflammatory places in pts lungs. D/w Dr Bensimhon, Augmentin  875-125  mg bid for 10 days sent to [facility] pharmacy.  All of the above explained to pt, who verbalized understanding. Results from lower leg study to be called to VAD pager [phone].  Lauraine Ip RN, BSN VAD Coordinator 24/7 Pager [phone]

## 2024-03-14 ENCOUNTER — Encounter (HOSPITAL_COMMUNITY): Admitting: Internal Medicine

## 2024-03-17 ENCOUNTER — Other Ambulatory Visit (HOSPITAL_COMMUNITY): Payer: Self-pay | Admitting: *Deleted

## 2024-03-17 DIAGNOSIS — I5043 Acute on chronic combined systolic (congestive) and diastolic (congestive) heart failure: Secondary | ICD-10-CM

## 2024-03-17 DIAGNOSIS — Z95811 Presence of heart assist device: Secondary | ICD-10-CM

## 2024-03-18 ENCOUNTER — Ambulatory Visit (HOSPITAL_COMMUNITY)
Admission: RE | Admit: 2024-03-18 | Discharge: 2024-03-18 | Disposition: A | Discharge status: Home / Self Care | Source: Ambulatory Visit | Attending: Cardiology | Admitting: Cardiology

## 2024-03-18 ENCOUNTER — Ambulatory Visit (HOSPITAL_COMMUNITY): Payer: Self-pay | Admitting: Pharmacist

## 2024-03-18 ENCOUNTER — Telehealth: Payer: Self-pay | Admitting: Pharmacy Technician

## 2024-03-18 ENCOUNTER — Other Ambulatory Visit (HOSPITAL_COMMUNITY): Payer: Self-pay

## 2024-03-18 ENCOUNTER — Telehealth (HOSPITAL_COMMUNITY): Payer: Self-pay | Admitting: Cardiology

## 2024-03-18 ENCOUNTER — Other Ambulatory Visit: Payer: Self-pay

## 2024-03-18 VITALS — BP 108/0 | HR 82 | Wt 167.6 lb

## 2024-03-18 DIAGNOSIS — Z4509 Encounter for adjustment and management of other cardiac device: Secondary | ICD-10-CM | POA: Diagnosis not present

## 2024-03-18 DIAGNOSIS — Z95811 Presence of heart assist device: Secondary | ICD-10-CM | POA: Insufficient documentation

## 2024-03-18 DIAGNOSIS — I5043 Acute on chronic combined systolic (congestive) and diastolic (congestive) heart failure: Secondary | ICD-10-CM | POA: Diagnosis present

## 2024-03-18 DIAGNOSIS — Z4801 Encounter for change or removal of surgical wound dressing: Secondary | ICD-10-CM | POA: Diagnosis not present

## 2024-03-18 DIAGNOSIS — Z7901 Long term (current) use of anticoagulants: Secondary | ICD-10-CM | POA: Diagnosis not present

## 2024-03-18 DIAGNOSIS — D509 Iron deficiency anemia, unspecified: Secondary | ICD-10-CM

## 2024-03-18 LAB — BASIC METABOLIC PANEL WITH GFR
Anion gap: 7 (ref 5–15)
BUN: <5 mg/dL — ABNORMAL LOW (ref 8–23)
CO2: 21 mmol/L — ABNORMAL LOW (ref 22–32)
Calcium: 8.4 mg/dL — ABNORMAL LOW (ref 8.9–10.3)
Chloride: 110 mmol/L (ref 98–111)
Creatinine, Ser: 1.18 mg/dL (ref 0.61–1.24)
GFR, Estimated: >60 mL/min (ref >60)
Glucose, Bld: 104 mg/dL — ABNORMAL HIGH (ref 70–99)
Potassium: 3.6 mmol/L (ref 3.5–5.1)
Sodium: 138 mmol/L (ref 135–145)

## 2024-03-18 LAB — CBC
HCT: 29.7 % — ABNORMAL LOW (ref 39.0–52.0)
Hemoglobin: 9.4 g/dL — ABNORMAL LOW (ref 13.0–17.0)
MCH: 30.6 pg (ref 26.0–34.0)
MCHC: 31.6 g/dL (ref 30.0–36.0)
MCV: 96.7 fL (ref 80.0–100.0)
Platelets: 284 K/uL (ref 150–400)
RBC: 3.07 MIL/uL — ABNORMAL LOW (ref 4.22–5.81)
RDW: 16.8 % — ABNORMAL HIGH (ref 11.5–15.5)
WBC: 9 K/uL (ref 4.0–10.5)
nRBC: 0 % (ref 0.0–0.2)

## 2024-03-18 LAB — LACTATE DEHYDROGENASE: LDH: 247 U/L — ABNORMAL HIGH (ref 98–192)

## 2024-03-18 LAB — PROTIME-INR
INR: 3.4 — ABNORMAL HIGH (ref 0.8–1.2)
Prothrombin Time: 35.6 s — ABNORMAL HIGH (ref 11.4–15.2)

## 2024-03-18 LAB — PREALBUMIN: Prealbumin: 6 mg/dL — ABNORMAL LOW (ref 18–38)

## 2024-03-18 MED ORDER — FERROUS SULFATE 325 (65 FE) MG PO TABS
325.0000 mg | ORAL_TABLET | Freq: Every day | ORAL | 3 refills | Status: AC
  Filled 2024-03-18: qty 30, 30d supply, fill #0
  Filled 2024-04-13: qty 30, 30d supply, fill #1
  Filled 2024-07-26: qty 30, 30d supply, fill #2

## 2024-03-18 NOTE — Patient Instructions (Signed)
 No medication changes Will send referral for IV iron infusion Return next Tuesday for dressing change and 1 year Intermacs and Annual Maintenance Return in 1 month to see Dr.Bensimhon

## 2024-03-18 NOTE — Telephone Encounter (Addendum)
 Auth Submission: NO AUTH NEEDED Site of care: [facility] Payer: [employer] DUAL Medication & CPT/J Code(s) submitted: Feraheme (ferumoxytol) [id number] Diagnosis Code: D50.9 Route of submission (phone, fax, portal):  Phone # Fax # Auth type: HB Units/visits requested: X2 Reference number:  Approval from: 03/18/24 to 06/25/24

## 2024-03-18 NOTE — Progress Notes (Signed)
 Patient presents for 1 month follow up in VAD Clinic today. Reports no problems with VAD equipment or concerns with drive line.   Pt with recent admission GIB workup. All tests were negative. No bleeding found in EGD or colonoscopy. H. Pylori was negative. Fecal occult was negative. Continued to complain of feeling poorly outpatient following admission. CT scan obtained followed by full body PET scan.  NM PET Impression: 1. No hypermetabolism associated with the 2.0 x 2.0 cm structure identified previously in the left thoracic inlet. 2. No focal hypermetabolism associated with the area of focal soft tissue thickening described previously in the superior esophagus. 3. New small right pleural effusion with dependent atelectasis in both lung bases. 4. 2.2 x 1.2 cm nodular consolidative opacity in the paraspinal right lung is new in the interval and without substantial hypermetabolism, potentially atelectatic or infectious/inflammatory. 5. Nonspecific uptake in the posterior musculature of the left calf region. PET imaging shows a branching linear configuration. Left lower extremity venous ultrasound study recommended to exclude DVT. 6. Trace free fluid in the pelvis. 7. Right groin hernia contains bowel loops without complicating features.  Results reviewed with Dr. Bensimhon last Tuesday. MD started pt on Augmentin  for 10 days. Pt reports no issues with antibiotic. Lower Extremity US  obtained showing chronic superficial left saphenous vein occlusion. Pt denies having any calf pain since July.   Pt states he is feeling better today. States leg swelling has improved with consistent use of compression stocking. Denies lightheadedness, dizziness, falls or signs of bleeding. Orthostatics obtained last Tuesday during nurse visit with no observed hypotension or flow changes. Reports occasional shortness of breath at work. Pt continues to work long hours. Advised to rest when he can and prop legs up as  able. Scan results reviewed by Dr. Zenaida in VAD Clinic today with pt. Hgb stable today at 9.4. Anemia panel obtained last week. Reviewed with Dr. Zenaida. Will refer for outpatient IV iron.   Vital Signs:   Automatic BP: 114/96 (104) HR: 82 SPO2: 100%   Weight: 167.6 lb w/ eqt Last weight: 168.2 lb w/ eqt  VAD Indication: Destination Therapy - pt preference, social issues   VAD interrogation & Equipment Management: Speed: 5600 Flow: 4.0 Power: 3.9 w PI: 5.2   Alarms: none Events: none  Fixed speed: 5400 Low speed limit:  5100   Primary Controller:  Replace back up battery in 21 months. Back up controller:  Not present  Annual Equipment Maintenance on UBC/PM was performed on 03/08/23.    I reviewed the LVAD parameters from today and compared the results to the patient's prior recorded data. LVAD interrogation was NEGATIVE for significant power changes, NEGATIVE for clinical alarms and STABLE for PI events/speed drops. No programming changes were made and pump is functioning within specified parameters. Pt is performing daily controller and system monitor self tests along with completing weekly and monthly maintenance for LVAD equipment.   LVAD equipment check completed and is in good working order. Back-up equipment NOT present. Charged back up battery and performed self-test on equipment.    Exit Site Care:  Existing VAD dressing removed and site care performed using sterile technique. Drive line exit site cleaned with Chlora prep applicators x 2, allowed to dry, and Sorbaview dressing with Silverlon patch applied. Exit site healed and incorporated, the velour is fully implanted at exit site. No redness, tenderness, drainage, foul odor or rash noted. Drive line anchor re-applied. Secured with large tegaderm. Pt denies fever or chills.  Significant Events on VAD Support:   Device: Medtronic Therapies: on VF 200 BPM Last check: 11/20/23  BP & Labs:  MAP 108 - Doppler is  reflecting MAP   Hgb 9.4 - No S/S of bleeding. Specifically denies melena/BRBPR or nosebleeds.   LDH stable at 247 with established baseline of 230-290. Denies tea-colored urine. No power elevations noted on interrogation.   Plan: No medication changes Will send referral for IV iron infusion Return next Tuesday for dressing change and 1 year Intermacs and Annual Maintenance Return in 1 month to see Dr.Bensimhon  Schuyler Lunger RN, BSN VAD Coordinator 24/7 Pager [phone]

## 2024-03-18 NOTE — Telephone Encounter (Signed)
 Patient referred to infusion pharmacy team for ambulatory infusion of IV iron.  Insurance - [employer] Medicare  Site of care - Site of care: [facility] Dx code - Z95.811/Z79.01 IV Iron Therapy - Feraheme 510 mg IV x 2  Infusion appointments - Scheduling team will schedule patient as soon as possible.    Britney Newstrom D. Aaleyah Witherow, PharmD

## 2024-03-18 NOTE — Progress Notes (Signed)
 LVAD CLINIC NOTE  PCP: Ziglar, Susan K, MD HF doc: DB  Chief complaint: HF  HPI:  Steven [name] is a 68 y.o. 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 Underwent HM-3 VAD placement 0n 03/08/23   Originally from [place], suffered a large out of hospital anterior wall myocardial infarction January 2012.  He presented after a three-hour delay to the hospital and was taken emergently for cath with DES to LAD. EF was 35-40% at time of MI. Unfortunately, did not have recovery of EF. C/w progressive decline in systolic function over the years. Ultimately underwent ICD.    Admitted on 02/26/23 w/ NYHA Class IV symptoms and low output. Echo showed EF < 20%, RV mildly reduced. RHC showed with low output. CI 2.1. Calcified pericardium (no H/o TB). Underwent HMIII LVAD implant on 03/08/23. Intra-op TEE LVEF 15%, RV normal.  Post op course was c/b afib w/ RVR, requiring IV amiodarone . Converted back to NSR.  Seen on 05/22/23 for unscheduled f/u due to low flow alarms on VAD. Pacer reprogrammed to let intrinsic rate come through (was RV pacing at 80). Feels much better. Walking 40 mins every day in the park. Denies orthopnea or PND. No fevers, chills or problems with driveline. No bleeding, melena or neuro symptoms. No VAD alarms. Taking all meds as prescribed.   Admitted 01/2024 after asymptomatic low flow alarm noted while in bed. Seen in LVAD clinic at which point it time he was orthostatic, dizzy, and hemoglobin came back at 8.4. Was admitted for GI workup which was largely negative. Remained dry during hospital visit, received multiple fluid boluses.   At his last visit, was still orthostatic requiring IV fluid bolus, and his speed was reduced to 5400 given frequent suck down events. He underwent imaging that showed a suspicious chest wall mass near his thoracic duct.  Underwent  PET scan that showed no hypermetabolic uptake.  He has overall been feeling much better, is back at work there was anxiously waiting a day off.  His appetite has improved and he has had no further issues keeping down fluids.  Vital signs are normal today and significant improvement in PI events on his bed.  LVAD Documentation    03/18/2024  Device Info  LVAD Type: Heartmate III  Date of Implant: 03/08/2023  Therapy Type: Destination Therapy      03/18/2024  Vitals  Heart Rate: 82 BPM  Automatic BP: 114/96  Doppler MAP: 104 mmHg  SpO2: 100 %    Last 3 Weights Weight Weight  03/18/2024 76.023 kg 167 lb 9.6 oz  02/18/2024 65 kg 143 lb 4.8 oz  02/17/2024 69.8 kg 153 lb 14.1 oz       03/18/2024  LVAD Paramaters  Speed: 5400 RPM  Flow: 4 LPM  PI: 5  Power: 4 Watts  Hematocrit: 20 %  Alarms: none  Events: none  Last Speed Change Date: 02/21/2024  Last Ramp Echo Date: 02/18/2024  Last Right Heart Cath Date: 03/06/2023  Bleeding History: No  Type of Dressing: Weekly  Annual Maintenance Date: 03/08/2023    Labs    Units 03/25/24 1002 03/18/24 0954 03/11/24 1145 03/04/24 1128 03/04/24 1127 02/21/24 1211 02/21/24 1013 02/18/24 0905  INR  2.6* 3.4*  --  2.0*  --   --  1.8* 1.8*  LDH U/L  --  247*  --   --   --   --  285* 283*  HGB g/dL  --  9.4* 8.6*  --  8.7*   < > 9.7* 8.7*  CREATININE mg/dL  --  8.81 8.76  --   --   --  1.09 1.04   < > = values in this interval not displayed.           VAD Indication: Destination Therapy - pt preference, social issues   Past Medical History:  Diagnosis Date   Acute MI, anterior wall (HCC)    AICD (automatic cardioverter/defibrillator) present    CAD (coronary artery disease)    2D ECHO, 07/13/2011 - EF <25%, LV moderatelty dilated, LA moderately dilatedLEXISCAN, 12/14/2011 - moderate-severe perfusion defect seen in the basal anteroseptal, mid anterior, apicacl anterior, apical, apical inferior, and apical lateral regions, post-stress EF  25%, new EKG changes from baseline abnormalities   Cancer (HCC)    Prostate   CHF (congestive heart failure) (HCC) 2012   Hypertension 08/08/2021   Inguinal hernia, left    Pneumonia    November 2023   Pre-diabetes     Current Outpatient Medications  Medication Sig Dispense Refill   acetaminophen  (TYLENOL ) 500 MG tablet Take 1,000 mg by mouth daily as needed for mild pain (pain score 1-3) or moderate pain (pain score 4-6).     amoxicillin -clavulanate (AUGMENTIN ) 875-125 MG tablet Take 1 tablet by mouth 2 (two) times daily for 10 days. 20 tablet 0   atorvastatin  (LIPITOR ) 80 MG tablet Take 1 tablet (80 mg total) by mouth daily. 90 tablet 3   docusate sodium  (COLACE) 100 MG capsule Take 2 capsules (200 mg total) by mouth daily. (Patient not taking: Reported on 02/13/2024) 100 capsule 0   ferrous sulfate  325 (65 FE) MG tablet Take 1 tablet (325 mg total) by mouth daily with breakfast. 30 tablet 3   finasteride  (PROSCAR ) 5 MG tablet Take 1 tablet (5 mg total) by mouth daily. 90 tablet 3   Multiple Vitamins-Minerals (CERTAVITE/ANTIOXIDANTS) TABS Take 1 tablet by mouth daily. 130 tablet 0   pantoprazole  (PROTONIX ) 40 MG tablet Take 1 tablet (40 mg total) by mouth daily. 30 tablet 6   tamsulosin  (FLOMAX ) 0.4 MG CAPS capsule Take 1 capsule (0.4 mg total) by mouth daily. 90 capsule 3   traZODone  (DESYREL ) 50 MG tablet Take 1 tablet (50 mg total) by mouth at bedtime as needed for sleep. 30 tablet 3   warfarin (COUMADIN ) 2.5 MG tablet Take 2.5 mg (1 tablet) every Mon,Wed, Fri; 5 mg (2 tablets) all other days; or as directed by heart failure clinic 60 tablet 6   No current facility-administered medications for this visit.    Patient has no known allergies.  REVIEW OF SYSTEMS: All systems negative except as listed in HPI, PMH and Problem list.   Physical Exam: GENERAL: NAD, well appearing PULM:  Normal work of breathing, CTAB CARDIAC:  JVP: flat LVAD hum, no edema ABDOMEN: Soft, mild  epigastric tenderness, otherwise normal, non-distended. Driveline site c/d/I, dressing and anchor in place NEUROLOGIC: Patient is oriented x3 with no focal or lateralizing neurologic deficits.    ASSESSMENT AND PLAN:  Chronic combined systolic and diastolic heart failure - Due to ICM with anterior MI in 2012 - Echo 02/27/23: EF <20%, LV with GHK, RV mildly reduced, GIIDD, LA mod dilated, mild MR.  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 GDMT limited by renal function and hypotension.  - S/P HM3 LVAD implant 03/08/23 - NYHA Class I from a HF standpoint, now euvolemic -  BP significantly improved - Hold on BP meds given normal flows, prior issues with BP - Can consider at next visit   Anemia/reduced appetite: Unclear etiology, improved. May have been related to previous URI. - Imaging reviewed as above  VAD  - RAMP echo 3/25 speed turned down from 5700 -> 5600 - Speed further decreased on 8/28 for frequent suck downs - LDH 247, stable, stable - Hgb 9.4, stable - DL ok - May be able to slowly increase speed at next visit - INR 3.4, goal 2-3, discussed with pharmacy  CAD:  - h/o large anterior MI 2012 treated w/ DES to LAD.  - No s/s angina - off asa as above.  - Continue statin   CKD stage 3: Improved - cardiorenal, resolved w/ LVAD support - Scr stable at 1.09   H/o VT s/p Medtronic ICD: likely scar mediated - No recent VT - off amio  Paroxysmal Atrial fibrillation:  - Remains in NSR. On warfarin  - INR management as above  I spent a total of 42 minutes today: 1) reviewing the patient's medical records including previous charts, labs and recent notes from other providers; 2) examining the patient and counseling them on their medical issues/explaining the plan of care; 3) adjusting meds as needed and 4) ordering lab work or other needed tests. 5) going over recent cancer workup   Morene JINNY Brownie, MD  6:03 AM

## 2024-03-24 ENCOUNTER — Other Ambulatory Visit (HOSPITAL_COMMUNITY): Payer: Self-pay

## 2024-03-24 ENCOUNTER — Encounter (HOSPITAL_COMMUNITY)
Admission: RE | Admit: 2024-03-24 | Discharge: 2024-03-24 | Disposition: A | Discharge status: Home / Self Care | Source: Ambulatory Visit | Attending: Cardiology | Admitting: Cardiology

## 2024-03-24 VITALS — BP 83/61 | HR 68 | Temp 97.5°F | Resp 16

## 2024-03-24 DIAGNOSIS — D631 Anemia in chronic kidney disease: Secondary | ICD-10-CM | POA: Insufficient documentation

## 2024-03-24 DIAGNOSIS — Z95811 Presence of heart assist device: Secondary | ICD-10-CM | POA: Diagnosis present

## 2024-03-24 DIAGNOSIS — N1831 Chronic kidney disease, stage 3a: Secondary | ICD-10-CM | POA: Diagnosis present

## 2024-03-24 DIAGNOSIS — Z7901 Long term (current) use of anticoagulants: Secondary | ICD-10-CM

## 2024-03-24 MED ORDER — SODIUM CHLORIDE 0.9 % IV SOLN
510.0000 mg | Freq: Once | INTRAVENOUS | Status: AC
  Administered 2024-03-24: 510 mg via INTRAVENOUS
  Filled 2024-03-24: qty 510

## 2024-03-25 ENCOUNTER — Ambulatory Visit (HOSPITAL_COMMUNITY)
Admission: RE | Admit: 2024-03-25 | Discharge: 2024-03-25 | Disposition: A | Discharge status: Home / Self Care | Source: Ambulatory Visit | Attending: Cardiology | Admitting: Cardiology

## 2024-03-25 ENCOUNTER — Ambulatory Visit (HOSPITAL_COMMUNITY): Payer: Self-pay | Admitting: Pharmacist

## 2024-03-25 DIAGNOSIS — Z7901 Long term (current) use of anticoagulants: Secondary | ICD-10-CM | POA: Diagnosis not present

## 2024-03-25 DIAGNOSIS — Z95811 Presence of heart assist device: Secondary | ICD-10-CM | POA: Insufficient documentation

## 2024-03-25 DIAGNOSIS — Z4801 Encounter for change or removal of surgical wound dressing: Secondary | ICD-10-CM | POA: Diagnosis not present

## 2024-03-25 LAB — PROTIME-INR
INR: 2.6 — ABNORMAL HIGH (ref 0.8–1.2)
Prothrombin Time: 29.2 s — ABNORMAL HIGH (ref 11.4–15.2)

## 2024-03-25 NOTE — Progress Notes (Signed)
 Patient presents for INR, dressing change, 1 yr Intermacs, and annual maintenance in VAD clinic alone. Pt does not have any concerns with drive line or VAD equipment.   Pt states he is feeling better today. States leg swelling has improved with consistent use of compression stocking. Denies lightheadedness, dizziness, falls or signs of bleeding.   Received dose of IV iron yesterday. Scheduled for 2nd dose next week.    Annual Equipment Maintenance on UBC/PM was performed on 03/25/24.   Primary Controller:  Replace back up battery in 21 months. Back up controller:  Replace back up battery in 21 months  LVAD equipment check completed and is in good working order. Back-up equipment present. Charged back up battery and performed self-test on equipment.    Exit Site Care: Drive line is being maintained once a week by VAD coordinators. Existing VAD dressing removed and site care performed using sterile technique. Drive line exit site cleaned with Chlora prep applicators x 2, allowed to dry, and Sorbaview dressing with Silverlon patch applied. Exit site healed and incorporated, the velour is exposed approx 1 at exit site. No redness, tenderness, drainage, foul odor or rash noted. Drive line anchor re-applied. Covered entire dressing with 2 large tegaderms. Pt denies fever or chills. Pt has sufficient kits at home.    Batteries Manufacture Date: Number of uses: Re-calibration  02/05/23 59 - 60 Performed by patient   Annual maintenance completed per [employer] on patient's home mobile power unit and Warehouse manager.    Backup system controller 11 volt battery charged during visit.   1 year Intermacs follow up completed including:  Quality of Life, KCCQ-12, and Neurocognitive trail making.   Pt completed 800 feet during 6 minute walk.   Patient Goals: to continue working full time if his health allows  [place] Cardiomyopathy Questionnaire     03/25/2024   12:56 PM 09/11/2023    3:54  PM 06/05/2023   12:47 PM  KCCQ-12  1 a. Ability to shower/bathe Not at all limited Slightly limited Not at all limited  1 b. Ability to walk 1 block Slightly limited Not at all limited Not at all limited  1 c. Ability to hurry/jog Slightly limited Other, Did not do Quite a bit limited  2. Edema feet/ankles/legs Less than once a week Never over the past 2 weeks Never over the past 2 weeks  3. Limited by fatigue 3+ times per week, not every day Never over the past 2 weeks Never over the past 2 weeks  4. Limited by dyspnea At least once a day Never over the past 2 weeks Never over the past 2 weeks  5. Sitting up / on 3+ pillows Never over the past 2 weeks Less than once a week Never over the past 2 weeks  6. Limited enjoyment of life Moderately limited Slightly limited Slightly limited  7. Rest of life w/ symptoms Mostly satisfied Mostly satisfied Mostly satisfied  8 a. Participation in hobbies Slightly limited Slightly limited Did not limit at all  8 b. Participation in chores Did not limit at all Did not limit at all Did not limit at all  8 c. Visiting family/friends Did not limit at all Did not limit at all Did not limit at all    Plan: Return next week Tuesday for dressing change Coumadin  dosing per Lauren PharmD  Schuyler Lunger RN, BSN VAD Coordinator 24/7 Pager [phone]

## 2024-03-31 ENCOUNTER — Encounter (HOSPITAL_COMMUNITY)
Admission: RE | Admit: 2024-03-31 | Discharge: 2024-03-31 | Disposition: A | Discharge status: Home / Self Care | Source: Ambulatory Visit | Attending: Cardiology | Admitting: Cardiology

## 2024-03-31 VITALS — BP 96/73 | HR 87 | Temp 97.9°F | Resp 16

## 2024-03-31 DIAGNOSIS — Z95811 Presence of heart assist device: Secondary | ICD-10-CM | POA: Diagnosis present

## 2024-03-31 MED ORDER — SODIUM CHLORIDE 0.9 % IV SOLN
510.0000 mg | Freq: Once | INTRAVENOUS | Status: AC
  Administered 2024-03-31: 510 mg via INTRAVENOUS
  Filled 2024-03-31: qty 510

## 2024-04-01 ENCOUNTER — Other Ambulatory Visit: Payer: Self-pay

## 2024-04-01 ENCOUNTER — Ambulatory Visit (HOSPITAL_COMMUNITY)

## 2024-04-01 ENCOUNTER — Ambulatory Visit (HOSPITAL_COMMUNITY)
Admission: RE | Admit: 2024-04-01 | Discharge: 2024-04-01 | Disposition: A | Discharge status: Home / Self Care | Source: Ambulatory Visit | Attending: Cardiology | Admitting: Cardiology

## 2024-04-01 DIAGNOSIS — Z4509 Encounter for adjustment and management of other cardiac device: Secondary | ICD-10-CM | POA: Insufficient documentation

## 2024-04-01 DIAGNOSIS — Z95811 Presence of heart assist device: Secondary | ICD-10-CM | POA: Diagnosis not present

## 2024-04-01 NOTE — Progress Notes (Signed)
 Patient presents for dressing change in VAD clinic alone. Pt does not have any concerns with drive line or VAD equipment.   Pt states he is feeling better today. States leg swelling has improved with consistent use of compression stocking. Denies lightheadedness, dizziness, falls or signs of bleeding.     Exit Site Care: Drive line is being maintained once a week by VAD coordinators. Existing VAD dressing removed and site care performed using sterile technique. Drive line exit site cleaned with Chlora prep applicators x 2, allowed to dry, and Sorbaview dressing with Silverlon patch applied. Exit site healed and incorporated, the velour is exposed approx 1 at exit site. No redness, tenderness, drainage, foul odor or rash noted. Drive line anchor re-applied. Covered entire dressing with 2 large tegaderms. Pt denies fever or chills. Pt has sufficient kits at home.    Plan: Return next week Thursday for full visit with Dr Cherrie    Isaiah Knoll RN VAD Coordinator  Office: [phone]  24/7 Pager: [phone]

## 2024-04-06 ENCOUNTER — Encounter: Payer: Self-pay | Admitting: *Deleted

## 2024-04-06 ENCOUNTER — Emergency Department (HOSPITAL_COMMUNITY)

## 2024-04-06 ENCOUNTER — Inpatient Hospital Stay (HOSPITAL_COMMUNITY)

## 2024-04-06 ENCOUNTER — Other Ambulatory Visit: Payer: Self-pay

## 2024-04-06 ENCOUNTER — Inpatient Hospital Stay (HOSPITAL_COMMUNITY)
Admission: EM | Admit: 2024-04-06 | Discharge: 2024-04-08 | DRG: 291 | Disposition: A | Discharge status: Home / Self Care | Attending: Cardiology | Admitting: Cardiology

## 2024-04-06 DIAGNOSIS — Z95810 Presence of automatic (implantable) cardiac defibrillator: Secondary | ICD-10-CM | POA: Diagnosis not present

## 2024-04-06 DIAGNOSIS — N182 Chronic kidney disease, stage 2 (mild): Secondary | ICD-10-CM | POA: Diagnosis present

## 2024-04-06 DIAGNOSIS — R0602 Shortness of breath: Secondary | ICD-10-CM | POA: Diagnosis present

## 2024-04-06 DIAGNOSIS — Z955 Presence of coronary angioplasty implant and graft: Secondary | ICD-10-CM | POA: Diagnosis not present

## 2024-04-06 DIAGNOSIS — I130 Hypertensive heart and chronic kidney disease with heart failure and stage 1 through stage 4 chronic kidney disease, or unspecified chronic kidney disease: Secondary | ICD-10-CM | POA: Diagnosis present

## 2024-04-06 DIAGNOSIS — Z7901 Long term (current) use of anticoagulants: Secondary | ICD-10-CM | POA: Diagnosis not present

## 2024-04-06 DIAGNOSIS — I5A Non-ischemic myocardial injury (non-traumatic): Secondary | ICD-10-CM | POA: Diagnosis present

## 2024-04-06 DIAGNOSIS — I82502 Chronic embolism and thrombosis of unspecified deep veins of left lower extremity: Secondary | ICD-10-CM | POA: Diagnosis present

## 2024-04-06 DIAGNOSIS — I252 Old myocardial infarction: Secondary | ICD-10-CM

## 2024-04-06 DIAGNOSIS — Z79899 Other long term (current) drug therapy: Secondary | ICD-10-CM | POA: Diagnosis not present

## 2024-04-06 DIAGNOSIS — J189 Pneumonia, unspecified organism: Secondary | ICD-10-CM | POA: Diagnosis present

## 2024-04-06 DIAGNOSIS — R7989 Other specified abnormal findings of blood chemistry: Secondary | ICD-10-CM | POA: Diagnosis not present

## 2024-04-06 DIAGNOSIS — I5043 Acute on chronic combined systolic (congestive) and diastolic (congestive) heart failure: Secondary | ICD-10-CM | POA: Diagnosis present

## 2024-04-06 DIAGNOSIS — I5084 End stage heart failure: Secondary | ICD-10-CM | POA: Diagnosis present

## 2024-04-06 DIAGNOSIS — Z23 Encounter for immunization: Secondary | ICD-10-CM | POA: Diagnosis present

## 2024-04-06 DIAGNOSIS — I959 Hypotension, unspecified: Secondary | ICD-10-CM | POA: Diagnosis present

## 2024-04-06 DIAGNOSIS — J9601 Acute respiratory failure with hypoxia: Secondary | ICD-10-CM | POA: Diagnosis present

## 2024-04-06 DIAGNOSIS — J9621 Acute and chronic respiratory failure with hypoxia: Secondary | ICD-10-CM | POA: Diagnosis not present

## 2024-04-06 DIAGNOSIS — I480 Paroxysmal atrial fibrillation: Secondary | ICD-10-CM | POA: Diagnosis present

## 2024-04-06 DIAGNOSIS — Z95811 Presence of heart assist device: Secondary | ICD-10-CM | POA: Diagnosis not present

## 2024-04-06 DIAGNOSIS — I2510 Atherosclerotic heart disease of native coronary artery without angina pectoris: Secondary | ICD-10-CM | POA: Diagnosis present

## 2024-04-06 DIAGNOSIS — E876 Hypokalemia: Secondary | ICD-10-CM | POA: Diagnosis present

## 2024-04-06 DIAGNOSIS — R0902 Hypoxemia: Secondary | ICD-10-CM

## 2024-04-06 DIAGNOSIS — Z1152 Encounter for screening for COVID-19: Secondary | ICD-10-CM

## 2024-04-06 LAB — CBC WITH DIFFERENTIAL/PLATELET
Abs Immature Granulocytes: 0.09 K/uL — ABNORMAL HIGH (ref 0.00–0.07)
Basophils Absolute: 0 K/uL (ref 0.0–0.1)
Basophils Relative: 0 %
Eosinophils Absolute: 0 K/uL (ref 0.0–0.5)
Eosinophils Relative: 0 %
HCT: 34.3 % — ABNORMAL LOW (ref 39.0–52.0)
Hemoglobin: 10.3 g/dL — ABNORMAL LOW (ref 13.0–17.0)
Immature Granulocytes: 1 %
Lymphocytes Relative: 7 %
Lymphs Abs: 1.2 K/uL (ref 0.7–4.0)
MCH: 30.7 pg (ref 26.0–34.0)
MCHC: 30 g/dL (ref 30.0–36.0)
MCV: 102.1 fL — ABNORMAL HIGH (ref 80.0–100.0)
Monocytes Absolute: 0.9 K/uL (ref 0.1–1.0)
Monocytes Relative: 6 %
Neutro Abs: 13.9 K/uL — ABNORMAL HIGH (ref 1.7–7.7)
Neutrophils Relative %: 86 %
Platelets: 229 K/uL (ref 150–400)
RBC: 3.36 MIL/uL — ABNORMAL LOW (ref 4.22–5.81)
RDW: 16.9 % — ABNORMAL HIGH (ref 11.5–15.5)
WBC: 16 K/uL — ABNORMAL HIGH (ref 4.0–10.5)
nRBC: 0 % (ref 0.0–0.2)

## 2024-04-06 LAB — MAGNESIUM: Magnesium: 1.6 mg/dL — ABNORMAL LOW (ref 1.7–2.4)

## 2024-04-06 LAB — COMPREHENSIVE METABOLIC PANEL WITH GFR
ALT: 17 U/L (ref 0–44)
AST: 37 U/L (ref 15–41)
Albumin: 2.7 g/dL — ABNORMAL LOW (ref 3.5–5.0)
Alkaline Phosphatase: 95 U/L (ref 38–126)
Anion gap: 18 — ABNORMAL HIGH (ref 5–15)
BUN: 10 mg/dL (ref 8–23)
CO2: 16 mmol/L — ABNORMAL LOW (ref 22–32)
Calcium: 8 mg/dL — ABNORMAL LOW (ref 8.9–10.3)
Chloride: 107 mmol/L (ref 98–111)
Creatinine, Ser: 1.15 mg/dL (ref 0.61–1.24)
GFR, Estimated: >60 mL/min (ref >60)
Glucose, Bld: 194 mg/dL — ABNORMAL HIGH (ref 70–99)
Potassium: 3.4 mmol/L — ABNORMAL LOW (ref 3.5–5.1)
Sodium: 141 mmol/L (ref 135–145)
Total Bilirubin: 0.8 mg/dL (ref 0.0–1.2)
Total Protein: 7.1 g/dL (ref 6.5–8.1)

## 2024-04-06 LAB — RESP PANEL BY RT-PCR (RSV, FLU A&B, COVID)  RVPGX2
Influenza A by PCR: NEGATIVE
Influenza B by PCR: NEGATIVE
Resp Syncytial Virus by PCR: NEGATIVE
SARS Coronavirus 2 by RT PCR: NEGATIVE

## 2024-04-06 LAB — I-STAT CG4 LACTIC ACID, ED
Lactic Acid, Venous: 4.2 mmol/L (ref 0.5–1.9)
Lactic Acid, Venous: 1.3 mmol/L (ref 0.5–1.9)

## 2024-04-06 LAB — TROPONIN I (HIGH SENSITIVITY)
Troponin I (High Sensitivity): 54 ng/L — ABNORMAL HIGH (ref <18)
Troponin I (High Sensitivity): 1262 ng/L (ref <18)

## 2024-04-06 LAB — LACTATE DEHYDROGENASE: LDH: 249 U/L — ABNORMAL HIGH (ref 98–192)

## 2024-04-06 LAB — MRSA NEXT GEN BY PCR, NASAL: MRSA by PCR Next Gen: NOT DETECTED

## 2024-04-06 LAB — BRAIN NATRIURETIC PEPTIDE: B Natriuretic Peptide: 882.9 pg/mL — ABNORMAL HIGH (ref 0.0–100.0)

## 2024-04-06 LAB — PROTIME-INR
INR: 1.6 — ABNORMAL HIGH (ref 0.8–1.2)
Prothrombin Time: 19.5 s — ABNORMAL HIGH (ref 11.4–15.2)

## 2024-04-06 LAB — TSH: TSH: 4.364 u[IU]/mL (ref 0.350–4.500)

## 2024-04-06 MED ORDER — TAMSULOSIN HCL 0.4 MG PO CAPS
0.4000 mg | ORAL_CAPSULE | Freq: Every day | ORAL | Status: DC
  Administered 2024-04-06 – 2024-04-08 (×3): 0.4 mg via ORAL
  Filled 2024-04-06 (×3): qty 1

## 2024-04-06 MED ORDER — SODIUM CHLORIDE 0.9 % IV SOLN
500.0000 mg | Freq: Once | INTRAVENOUS | Status: AC
  Administered 2024-04-06: 500 mg via INTRAVENOUS
  Filled 2024-04-06: qty 5

## 2024-04-06 MED ORDER — ACETAMINOPHEN 500 MG PO TABS: 1000.0000 mg | ORAL_TABLET | Freq: Every day | ORAL | Status: DC | PRN

## 2024-04-06 MED ORDER — WARFARIN SODIUM 2.5 MG PO TABS: 2.5000 mg | ORAL_TABLET | Freq: Every day | ORAL | Status: DC

## 2024-04-06 MED ORDER — ACETAMINOPHEN 325 MG PO TABS: 650.0000 mg | ORAL_TABLET | ORAL | Status: DC | PRN

## 2024-04-06 MED ORDER — FUROSEMIDE 10 MG/ML IJ SOLN
40.0000 mg | Freq: Once | INTRAMUSCULAR | Status: AC
  Administered 2024-04-06: 40 mg via INTRAVENOUS
  Filled 2024-04-06: qty 4

## 2024-04-06 MED ORDER — FINASTERIDE 5 MG PO TABS
5.0000 mg | ORAL_TABLET | Freq: Every day | ORAL | Status: DC
  Administered 2024-04-06 – 2024-04-08 (×3): 5 mg via ORAL
  Filled 2024-04-06 (×3): qty 1

## 2024-04-06 MED ORDER — FERROUS SULFATE 325 (65 FE) MG PO TABS
325.0000 mg | ORAL_TABLET | Freq: Every day | ORAL | Status: DC
  Administered 2024-04-07 – 2024-04-08 (×2): 325 mg via ORAL
  Filled 2024-04-06 (×2): qty 1

## 2024-04-06 MED ORDER — IOHEXOL 350 MG/ML SOLN
75.0000 mL | Freq: Once | INTRAVENOUS | Status: AC | PRN
  Administered 2024-04-06: 75 mL via INTRAVENOUS

## 2024-04-06 MED ORDER — ONDANSETRON HCL 4 MG/2ML IJ SOLN: 4.0000 mg | Freq: Four times a day (QID) | INTRAMUSCULAR | Status: DC | PRN

## 2024-04-06 MED ORDER — SODIUM CHLORIDE 0.9 % IV SOLN
1.0000 g | Freq: Once | INTRAVENOUS | Status: AC
  Administered 2024-04-06: 1 g via INTRAVENOUS
  Filled 2024-04-06: qty 10

## 2024-04-06 MED ORDER — CEFTRIAXONE SODIUM 2 G IJ SOLR
2.0000 g | INTRAMUSCULAR | Status: AC
  Administered 2024-04-07 – 2024-04-08 (×2): 2 g via INTRAVENOUS
  Filled 2024-04-06 (×2): qty 20

## 2024-04-06 MED ORDER — WARFARIN - PHARMACIST DOSING INPATIENT: Freq: Every day | Status: DC

## 2024-04-06 MED ORDER — PANTOPRAZOLE SODIUM 40 MG PO TBEC
40.0000 mg | DELAYED_RELEASE_TABLET | Freq: Every day | ORAL | Status: DC
  Administered 2024-04-06 – 2024-04-08 (×3): 40 mg via ORAL
  Filled 2024-04-06 (×3): qty 1

## 2024-04-06 MED ORDER — WARFARIN SODIUM 3 MG PO TABS
6.0000 mg | ORAL_TABLET | Freq: Once | ORAL | Status: AC
  Administered 2024-04-06: 6 mg via ORAL
  Filled 2024-04-06: qty 2
  Filled 2024-04-06: qty 1

## 2024-04-06 MED ORDER — INFLUENZA VAC SPLIT HIGH-DOSE 0.5 ML IM SUSY
0.5000 mL | PREFILLED_SYRINGE | INTRAMUSCULAR | Status: AC
  Administered 2024-04-07: 0.5 mL via INTRAMUSCULAR
  Filled 2024-04-06: qty 0.5

## 2024-04-06 MED ORDER — AZITHROMYCIN 500 MG PO TABS
500.0000 mg | ORAL_TABLET | Freq: Every day | ORAL | Status: AC
  Administered 2024-04-07 – 2024-04-08 (×2): 500 mg via ORAL
  Filled 2024-04-06 (×2): qty 1

## 2024-04-06 MED ORDER — ATORVASTATIN CALCIUM 80 MG PO TABS
80.0000 mg | ORAL_TABLET | Freq: Every day | ORAL | Status: DC
  Administered 2024-04-07 – 2024-04-08 (×2): 80 mg via ORAL
  Filled 2024-04-06 (×2): qty 1

## 2024-04-06 MED ORDER — TRAZODONE HCL 50 MG PO TABS
50.0000 mg | ORAL_TABLET | Freq: Every evening | ORAL | Status: DC | PRN
  Administered 2024-04-06 – 2024-04-07 (×2): 50 mg via ORAL
  Filled 2024-04-06 (×2): qty 1

## 2024-04-06 NOTE — ED Triage Notes (Signed)
 Pt bib gcems from home.C/O SOB x2hrs. Pt has LVAD and says last time this happened he had to get lvad placed. Fire said pt was 82% room air, placed him on NRB. Pt arrives on 3L on at 95%. LVAD has not alarmed, reports no complications with LVAD, had it about a year. Denies any pain, does not wear O2 at home.

## 2024-04-06 NOTE — H&P (Addendum)
 Advanced Heart Failure Team History and Physical Note   PCP:  Ziglar, Devere POUR, MD  PCP-Cardiology: Debby Sor, MD (Inactive)     Reason for Admission: Acute hypoxic respiratory failure   HPI:   Patient with a PMH of chronic combined systolic and diastolic heart failure, CAD, VT s/p Medtronic ICD who underwent HM3 placement on 02/2023. He presents with acute on chronic hypoxic respiratory failure.  Patient recently hospitalized for symptomatic low flows, his hemoglobin was found to be newly low and he was profoundly orthostatic.  He was admitted for GI workup that was largely negative, required multiple fluid boluses during the admission.  Further orthostatic symptoms in clinic and slow speed was dropped to 5400.  Underwent malignancy workup but thankfully returned negative after pet imaging.  Had been feeling better.  On the day of admission, had previously been in his normal state of health.  However, he woke up overnight gasping for air and found it difficult to breathe.  His initial saturation was 82% on room air he was placed on 3 L.  When he called the LVAD coordinator he was gasping for air and she recommended calling 911 immediately.  On my examination, he is breathing much better, though still complains of being short of breath.  He notes that he has had worsening swelling in his lower extremities the past few days.  Denies any current cardiac or pleuritic chest pain.  Denies any fever, chills, cough.  Did have some worsening orthopnea this morning.  She is      Vital Signs:   Temp:  [98.4 F (36.9 C)-98.5 F (36.9 C)] 98.4 F (36.9 C) (10/12 1136) Pulse Rate:  [48-103] 48 (10/12 1345) Resp:  [18-27] 26 (10/12 1345) BP: (87-127)/(68-98) 127/91 (10/12 1345) SpO2:  [97 %-100 %] 98 % (10/12 1345) Weight:  [68 kg] 68 kg (10/12 0720)   Filed Weights   04/06/24 0720  Weight: 68 kg     Physical Exam     General:  Well appearing. No resp difficulty Cor: Mechanical heart  sounds with LVAD hum present. JVP moderately elevated, 1+ LE edema Lungs: Normal WOB Abdomen: soft, nontender, nondistended.  Driveline: C/D/I; securement device intact and driveline incorporated Neuro: alert & orientedx3, cranial nerves grossly intact. moves all 4 extremities w/o difficulty.    Telemetry   Sinus rhythm  EKG and Cardiac Imaging   Poor baseline, but no ST elevation  Patient Profile   Patient with a PMH of chronic combined systolic and diastolic heart failure, CAD, VT s/p Medtronic ICD who underwent HM3 placement on 02/2023. He presents with acute on chronic hypoxic respiratory failure.  Assessment/Plan   Acute on chronic hypoxic respiratory failure: Patient with acute onset hypoxic respiratory failure this morning.  Does appear mildly volume up so we will give small dose of IV Lasix  and increase speed to 5500.  Additionally, white blood cell count is elevated as well so we will obtain respiratory viral panel, blood cultures, and treat with CAP coverage. - Troponin came back elevated with significant delta, given chronic lower extremity DVT and normal renal function will obtain CTA PE - Lactic acid initially elevated on arrival, now improved - 40 mg IV Lasix  - Azithromycin  plus ceftriaxone  - Blood cultures - Maintain on supplemental oxygen  Elevated troponin: Suspect type II NSTEMI in the setting of hypoxic respiratory failure, elevated lactic, lack of chest pain.  - Will obtain repeat echo - No current plan to cath given lack of anginal equivalent -  PE workup as above  Acute on chronic combined systolic and diastolic heart failure - Due to ICM with anterior MI in 2012 - Echo 02/27/23: EF <20%, LV with GHK, RV mildly reduced, GIIDD, LA mod dilated, mild MR.  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 GDMT limited by renal function and hypotension.  - S/P HM3 LVAD implant 03/08/23 - Volume up on CXR and exam, IV lasix  as above - Increase speed to 5500RPM -  Continue warfarin, goal 2-2.5, INR 1.6, reviewed with pharmacy  CAD:  - h/o large anterior MI 2012 treated w/ DES to LAD.  - No s/s angina - off asa as above.  - Continue statin   CKD stage 3: Improved - cardiorenal, resolved w/ LVAD support - Scr stable at 1.09   H/o VT s/p Medtronic ICD: likely scar mediated - No recent VT - off amio   Paroxysmal Atrial fibrillation:  - Remains in NSR. On warfarin  - INR management as above  LE DVT:  - Warfarin as above  LVAD Interrogation HM 3: Speed: 5500 Flow: 4.2 PI: 4 Power: 5. Few PI events    Morene JINNY Brownie, MD 04/06/2024, 2:09 PM  Advanced Heart Failure Team Pager [phone] (M-F; 7a - 5p)  Please contact [facility] for night-coverage after hours (4p -7a ) and weekends on [email or web address]

## 2024-04-06 NOTE — Progress Notes (Addendum)
 LVAD Coordinator ED Encounter  Steven [name] a 68 y.o. male that presented to [facility] ER today due to hypoxia. He has a past medical history  has a past medical history of Acute MI, anterior wall (HCC), AICD (automatic cardioverter/defibrillator) present, CAD (coronary artery disease), Cancer (HCC), CHF (congestive heart failure) (HCC) (2012), Hypertension (08/08/2021), Inguinal hernia, left, Pneumonia, and Pre-diabetes.   LVAD is a HM3 and was implanted on 03/08/23 by Dr Lucas for destination therapy.   Received page from patient at 0600 stating he couldn't breathe. Clearly in respiratory distress. Audible wheezing and rhoncus breath sounds on the phone. States shortness of breath started at 0500. Was barely able to speak to tell me he wasn't feeling well while gasping for air. Instructed pt to hang up with me, and immediately call 911 to come to the ER for evaluation. He verbalized understanding.   Pt arrived to ER at 0715. Per EMS pt was 82% on RA on their arrival. Placed on NRB for transport. Currently on 3L [place]. Spoke with pt- states he is feeling better with oxygen on.   Spoke with bedside RN and provided VAD pager #.   Chest xray: Increased bilateral pleural effusions and new interstitial markings compatible with pulmonary edema.  CT Angio:  1. No evidence of pulmonary embolism. 2. Moderate severity bilateral lower lobe scarring and/or atelectasis. 3. Small to moderate size bilateral pleural effusions. 4. Mild cardiomegaly with marked severity coronary artery calcification. 5. Left ventricular assist device. 6. Small hiatal hernia. 7. Emphysema.  WBC 16. Lactic 4.2. VAD speed increased to 5500 per Dr Zenaida at bedside.   Vital signs: HR: 103 Doppler MAP:  Automated BP: 87/68 (76) O2 Sat: 100% on 3L  LVAD interrogation reveals:  Speed: 5400 Flow: 4.4 Power:  3.9 w PI: 2.7  Alarms: none Events: rare  Drive Line: Managed weekly by VAD coordinators in VAD clinic. Dressing change  due 04/08/24.   Significant Events with LVAD: - Admitted 01/2024 after asymptomatic low flow alarm noted while in bed. Seen in LVAD clinic at which point it time he was orthostatic, dizzy, and hemoglobin came back at 8.4. Was admitted for GI workup which was largely negative. Remained dry during hospital visit, received multiple fluid boluses.   -  Seen in clinic 02/21/24- was still orthostatic requiring IV fluid bolus, and his speed was reduced to 5400 given frequent suck down events. He underwent imaging that showed a suspicious chest wall mass near his thoracic duct.  Underwent PET scan that showed no hypermetabolic uptake.     Updated VAD Providers (Dr Zenaida) about the above. No LVAD issues and pump is functioning as expected. Able to independently manage LVAD equipment. No LVAD needs at this time.   Plan:  Admit to 2C per Dr Zenaida Isaiah Knoll RN VAD Coordinator  Office: [phone]  24/7 Pager: [phone]

## 2024-04-06 NOTE — Progress Notes (Signed)
 ANTICOAGULATION CONSULT NOTE  Pharmacy Consult for Warfarin Indication: LVAD; Apical mural thrombus   No Known Allergies  Patient Measurements: Height: 5' 9 (175.3 cm) Weight: 68 kg (150 lb) IBW/kg (Calculated) : 70.7  Vital Signs: Temp: 98.4 F (36.9 C) (10/12 1136) Temp Source: Oral (10/12 1136) BP: 98/83 (10/12 1130) Pulse Rate: 81 (10/12 1130)  Labs: Recent Labs    04/06/24 0756 04/06/24 0957  HGB 10.3*  --   HCT 34.3*  --   PLT 229  --   LABPROT 19.5*  --   INR 1.6*  --   CREATININE 1.15  --   TROPONINIHS 54* 1,262*    Estimated Creatinine Clearance: 58.3 mL/min (by C-G formula based on SCr of 1.15 mg/dL).   Medical History: Past Medical History:  Diagnosis Date   Acute MI, anterior wall (HCC)    AICD (automatic cardioverter/defibrillator) present    CAD (coronary artery disease)    2D ECHO, 07/13/2011 - EF <25%, LV moderatelty dilated, LA moderately dilatedLEXISCAN, 12/14/2011 - moderate-severe perfusion defect seen in the basal anteroseptal, mid anterior, apicacl anterior, apical, apical inferior, and apical lateral regions, post-stress EF 25%, new EKG changes from baseline abnormalities   Cancer (HCC)    Prostate   CHF (congestive heart failure) (HCC) 2012   Hypertension 08/08/2021   Inguinal hernia, left    Pneumonia    November 2023   Pre-diabetes     Medications:  (Not in a hospital admission)  Scheduled:  Infusions:  PRN: acetaminophen , ondansetron  (ZOFRAN ) IV  Assessment: 18 yom with a history of HF 2/2 ICM, CAD, VT s/p medtronic ICD, HLD, apical mural thrombus, CKD, h/o subdural hematoma, and HM-3 VAD. Patient is presenting with SOB. Warfarin per pharmacy consult placed for LVAD; Apical mural thrombus.  Patient taking warfarin prior to arrival. Home dose is 2.5 mg (2.5 mg x 1) every Mon, Wed, Fri; 5 mg (2.5 mg x 2) all other days . Last taken 10/11 8pm.  PT / INR today is 19.5 / 1.6, which is sub-therapeutic Hgb 10.3; plt 229  Goal of  Therapy:  INR Goal 2-2.5 Monitor platelets by anticoagulation protocol: Yes   Plan:  Give 6mg  warfarin tonight - repeat dosing per INR Monitor for s/s of hemorrhage, daily INR, CBC Watch for new DDIs  Dorn Buttner, PharmD, BCPS 04/06/2024 11:47 AM ED Clinical Pharmacist -  [phone]

## 2024-04-06 NOTE — ED Notes (Signed)
 LVAD cart at bedside. Asked pt if he would hook himself up to the wall cart. Pt said he did not want to, he just changed his batteries and they were fully charged. Pt has his backup batteries at bedside. Informed pt to let us  know when he wants to plug them in.

## 2024-04-06 NOTE — ED Notes (Signed)
 Patient transported to CT

## 2024-04-06 NOTE — ED Notes (Signed)
 CCMD called at bedside

## 2024-04-06 NOTE — ED Provider Notes (Signed)
 [place] EMERGENCY DEPARTMENT AT [facility] Provider Note   CSN: [id number] Arrival date & time: 04/06/24  9283     Patient presents with: Shortness of Breath   Steven [name] is a 68 y.o. male.   The history is provided by the patient and medical records. No language interpreter was used.  Shortness of Breath Severity:  Severe Onset quality:  Gradual Duration:  3 hours Timing:  Constant Progression:  Improving Chronicity:  Recurrent Context: not URI   Relieved by:  Nothing Worsened by:  Nothing Ineffective treatments:  None tried Associated symptoms: no abdominal pain, no chest pain, no cough, no diaphoresis, no fever, no headaches, no neck pain, no rash, no sputum production, no vomiting and no wheezing        Prior to Admission medications   Medication Sig Start Date End Date Taking? Authorizing Provider  acetaminophen  (TYLENOL ) 500 MG tablet Take 1,000 mg by mouth daily as needed for mild pain (pain score 1-3) or moderate pain (pain score 4-6).    [provider]  atorvastatin  (LIPITOR ) 80 MG tablet Take 1 tablet (80 mg total) by mouth daily. 08/02/23   Bensimhon, Toribio SAUNDERS, MD  docusate sodium  (COLACE) 100 MG capsule Take 2 capsules (200 mg total) by mouth daily. Patient taking differently: Take 2 capsules (200 mg total) by mouth daily. 05/15/23   Bensimhon, Toribio SAUNDERS, MD  ferrous sulfate  325 (65 FE) MG tablet Take 1 tablet (325 mg total) by mouth daily with breakfast. 03/18/24   Zenaida Morene PARAS, MD  finasteride  (PROSCAR ) 5 MG tablet Take 1 tablet (5 mg total) by mouth daily. 11/13/23   Bensimhon, Toribio SAUNDERS, MD  Multiple Vitamins-Minerals (CERTAVITE/ANTIOXIDANTS) TABS Take 1 tablet by mouth daily. 03/26/23   Love, Sharlet RAMAN, PA-C  pantoprazole  (PROTONIX ) 40 MG tablet Take 1 tablet (40 mg total) by mouth daily. 11/08/23   Bensimhon, Toribio SAUNDERS, MD  tamsulosin  (FLOMAX ) 0.4 MG CAPS capsule Take 1 capsule (0.4 mg total) by mouth daily. 03/06/24     traZODone   (DESYREL ) 50 MG tablet Take 1 tablet (50 mg total) by mouth at bedtime as needed for sleep. 12/31/23   Bensimhon, Toribio SAUNDERS, MD  warfarin (COUMADIN ) 2.5 MG tablet Take 2.5 mg (1 tablet) every Mon,Wed, Fri; 5 mg (2 tablets) all other days; or as directed by heart failure clinic 02/18/24   Marcine Caffie HERO, PA-C    Allergies: Patient has no known allergies.    Review of Systems  Constitutional:  Negative for chills, diaphoresis and fever.  HENT:  Negative for congestion.   Eyes:  Negative for visual disturbance.  Respiratory:  Positive for shortness of breath. Negative for cough, sputum production and wheezing.   Cardiovascular:  Negative for chest pain, palpitations and leg swelling.  Gastrointestinal:  Negative for abdominal pain, constipation, diarrhea, nausea and vomiting.  Genitourinary:  Negative for dysuria.  Musculoskeletal:  Negative for back pain, neck pain and neck stiffness.  Skin:  Negative for rash and wound.  Neurological:  Negative for weakness, light-headedness, numbness and headaches.  Psychiatric/Behavioral:  Negative for agitation.   All other systems reviewed and are negative.   Updated Vital Signs Pulse (!) 103   Resp (!) 25   Ht 5' 9 (1.753 m)   Wt 68 kg   SpO2 100%   BMI 22.15 kg/m   Physical Exam Vitals and nursing note reviewed.  Constitutional:      General: He is not in acute distress.    Appearance:  He is well-developed. He is not ill-appearing, toxic-appearing or diaphoretic.  HENT:     Head: Normocephalic and atraumatic.  Eyes:     Conjunctiva/sclera: Conjunctivae normal.     Pupils: Pupils are equal, round, and reactive to light.  Cardiovascular:     Rate and Rhythm: Normal rate and regular rhythm.     Heart sounds: No murmur heard. Pulmonary:     Effort: Pulmonary effort is normal. No respiratory distress.     Breath sounds: Rales present. No wheezing or rhonchi.  Chest:     Chest wall: No tenderness.  Abdominal:     Palpations: Abdomen  is soft.     Tenderness: There is no abdominal tenderness.  Musculoskeletal:        General: No swelling.     Cervical back: Neck supple.     Right lower leg: No edema.     Left lower leg: No edema.  Skin:    General: Skin is warm and dry.     Capillary Refill: Capillary refill takes less than 2 seconds.     Findings: No erythema.  Neurological:     General: No focal deficit present.     Mental Status: He is alert.  Psychiatric:        Mood and Affect: Mood normal.     (all labs ordered are listed, but only abnormal results are displayed) Labs Reviewed  CBC WITH DIFFERENTIAL/PLATELET - Abnormal; Notable for the following components:      Result Value   WBC 16.0 (*)    RBC 3.36 (*)    Hemoglobin 10.3 (*)    HCT 34.3 (*)    MCV 102.1 (*)    RDW 16.9 (*)    Neutro Abs 13.9 (*)    Abs Immature Granulocytes 0.09 (*)    All other components within normal limits  COMPREHENSIVE METABOLIC PANEL WITH GFR - Abnormal; Notable for the following components:   Potassium 3.4 (*)    CO2 16 (*)    Glucose, Bld 194 (*)    Calcium  8.0 (*)    Albumin  2.7 (*)    Anion gap 18 (*)    All other components within normal limits  PROTIME-INR - Abnormal; Notable for the following components:   Prothrombin Time 19.5 (*)    INR 1.6 (*)    All other components within normal limits  LACTATE DEHYDROGENASE - Abnormal; Notable for the following components:   LDH 249 (*)    All other components within normal limits  BRAIN NATRIURETIC PEPTIDE - Abnormal; Notable for the following components:   B Natriuretic Peptide 882.9 (*)    All other components within normal limits  MAGNESIUM  - Abnormal; Notable for the following components:   Magnesium  1.6 (*)    All other components within normal limits  I-STAT CG4 LACTIC ACID, ED - Abnormal; Notable for the following components:   Lactic Acid, Venous 4.2 (*)    All other components within normal limits  TROPONIN I (HIGH SENSITIVITY) - Abnormal; Notable for  the following components:   Troponin I (High Sensitivity) 54 (*)    All other components within normal limits  RESP PANEL BY RT-PCR (RSV, FLU A&B, COVID)  RVPGX2  TSH  I-STAT CG4 LACTIC ACID, ED  TROPONIN I (HIGH SENSITIVITY)    EKG: EKG Interpretation Date/Time:  Sunday April 06 2024 08:25:30 EDT Ventricular Rate:  97 PR Interval:  165 QRS Duration:  101 QT Interval:  350 QTC Calculation: 445 R Axis:   -  88  Text Interpretation: Poor quality data, interpretation may be affected Sinus rhythm Inferior infarct, old Abnormal lateral Q waves Anterior infarct, old ST depr, consider ischemia, anterolateral lds Artifact in lead(s) I II III aVR aVL aVF V1 V2 V4 V5 V6 when compard to prior, similar appearance No sTEMI Confirmed by Ginger Barefoot ([id number]) on 04/06/2024 8:31:19 AM  Radiology: DG Chest Portable 1 View Result Date: 04/06/2024 EXAM: 1 VIEW XRAY OF THE CHEST 04/06/2024 08:13:00 AM COMPARISON: None available. CLINICAL HISTORY: sob, hypoxia. Reason for exam: shortness of breath, hypoxia; Triage notes: Pt bib gcems from home.C/O SOB x2hrs. Pt has LVAD and says last time this happened he had to get lvad placed. Fire said pt was 82% room air, placed him on NRB. Pt arrives on 3L on at 95%. LVAD has not alarmed, reports no complications with ; LVAD, had it about a year. Denies any pain, does not wear O2 at home. FINDINGS: LINES, TUBES AND DEVICES: Left chest wall AICD is noted with leads in the right atrial appendage and right ventricle. Stable position of LVAD. Intact sternotomy wires LUNGS AND PLEURA: Increased bilateral pleural effusions. New increased interstitial markings compatible with pulmonary edema. Subsegmental atelectasis noted in the lung bases. No focal pulmonary opacity. No pneumothorax. HEART AND MEDIASTINUM: Unchanged cardiac enlargement. BONES AND SOFT TISSUES: Visualized osseous structures are grossly intact. Artifact from overlying cardiac leads. IMPRESSION: 1. Increased  bilateral pleural effusions and new interstitial markings compatible with pulmonary edema. Electronically signed by: Waddell Calk MD 04/06/2024 08:23 AM EDT RP Workstation: [id number]     Procedures   CRITICAL CARE Performed by: Lonni PARAS Samie Barclift Total critical care time: 30 minutes Critical care time was exclusive of separately billable procedures and treating other patients. Critical care was necessary to treat or prevent imminent or life-threatening deterioration. Critical care was time spent personally by me on the following activities: development of treatment plan with patient and/or surrogate as well as nursing, discussions with consultants, evaluation of patient's response to treatment, examination of patient, obtaining history from patient or surrogate, ordering and performing treatments and interventions, ordering and review of laboratory studies, ordering and review of radiographic studies, pulse oximetry and re-evaluation of patient's condition.   Medications Ordered in the ED  cefTRIAXone  (ROCEPHIN ) 1 g in sodium chloride  0.9 % 100 mL IVPB (1 g Intravenous New Bag/Given 04/06/24 1023)  azithromycin  (ZITHROMAX ) 500 mg in sodium chloride  0.9 % 250 mL IVPB (has no administration in time range)                                    Medical Decision Making Amount and/or Complexity of Data Reviewed Labs: ordered. Radiology: ordered.  Risk Decision regarding hospitalization.    Steven [name] is a 68 y.o. male with a past medical history significant for CAD, CHF status post LVAD, previous prostate cancer, and CKD who presents with shortness of breath and hypoxia.  According to patient, he has had some waxing waning mild edema over the last few weeks but otherwise was having no symptoms.  He says that this morning he woke up several hours ago gasping for breath and could not breathe.  Fire department document oxygen saturations of 82% on room air and he does not take oxygen at  home.  He initially did not regular and is now de-escalated 3 L to maintain oxygen saturations in the 90s.  He is breathing much better now  and his oxygen saturations are improved.  He denies any fevers, chills, congestion, or cough.  Denies hemoptysis.  He denies any chest pain, palpitations, nausea, vomiting, constipation, diarrhea, or urinary changes.  He denies any trauma.  Otherwise he is resting now.  On exam, he does have some faint crackles in the bases but otherwise had not appreciate any wheezing or rhonchi.  His chest is nontender.  Abdomen nontender.  He does have the work of his LVAD.  Legs have no appreciable edema on my exam and patient is otherwise resting and well-appearing.  He is afebrile.  LVAD coordinator is involved and is recommending some different labs.  Will get other labs as well to look for etiology of his hypoxia.  Given his lack of pleuritic chest pain, have less suspicion for pulmonary embolism at this time as he is on blood thinners as well.  Will get chest x-ray, COVID swab given the ongoing pandemic with this hypoxia, and other labs.  Anticipate admission given the new hypoxia after workup is completed.  10:25 AM Advanced heart failure team will admit.  X-ray does show fluid but no clear opacity however given the leukocytosis, lactic acidosis, hypoxia, and reported dry cough, will give antibiotics per cardiology and they will admit for further management.  Patient agrees with this plan.      Final diagnoses:  SOB (shortness of breath)  Hypoxia  Pneumonia due to infectious organism, unspecified laterality, unspecified part of lung    Clinical Impression: 1. SOB (shortness of breath)   2. Hypoxia   3. Pneumonia due to infectious organism, unspecified laterality, unspecified part of lung     Disposition: Admit  This note was prepared with assistance of Dragon voice recognition software. Occasional wrong-word or sound-a-like substitutions may have occurred  due to the inherent limitations of voice recognition software.     Shey Yott, Lonni PARAS, MD 04/06/24 1026

## 2024-04-06 NOTE — Progress Notes (Signed)
 0600- Received page from patient stating he couldn't breathe. Clearly in respiratory distress. Audible wheezing and rhoncus breath sounds on the phone. States shortness of breath started an hour ago. Was barely able to speak to tell me he wasn't feeling well while gasping for air. Instructed pt to hang up with me, and immediately call 911 to come to the ER for evaluation. He verbalized understanding.   Dr Zenaida aware of the above.   Isaiah Knoll RN  VAD Coordinator  Office: [phone]  Pager: [phone]

## 2024-04-07 ENCOUNTER — Encounter (HOSPITAL_COMMUNITY): Payer: Self-pay | Admitting: Cardiology

## 2024-04-07 ENCOUNTER — Inpatient Hospital Stay (HOSPITAL_COMMUNITY)

## 2024-04-07 DIAGNOSIS — Z95811 Presence of heart assist device: Secondary | ICD-10-CM

## 2024-04-07 DIAGNOSIS — J9601 Acute respiratory failure with hypoxia: Secondary | ICD-10-CM | POA: Diagnosis not present

## 2024-04-07 DIAGNOSIS — I5043 Acute on chronic combined systolic (congestive) and diastolic (congestive) heart failure: Secondary | ICD-10-CM | POA: Diagnosis not present

## 2024-04-07 DIAGNOSIS — J9621 Acute and chronic respiratory failure with hypoxia: Secondary | ICD-10-CM | POA: Diagnosis not present

## 2024-04-07 DIAGNOSIS — R7989 Other specified abnormal findings of blood chemistry: Secondary | ICD-10-CM

## 2024-04-07 LAB — CBC
HCT: 28.5 % — ABNORMAL LOW (ref 39.0–52.0)
Hemoglobin: 9.2 g/dL — ABNORMAL LOW (ref 13.0–17.0)
MCH: 30.9 pg (ref 26.0–34.0)
MCHC: 32.3 g/dL (ref 30.0–36.0)
MCV: 95.6 fL (ref 80.0–100.0)
Platelets: 195 K/uL (ref 150–400)
RBC: 2.98 MIL/uL — ABNORMAL LOW (ref 4.22–5.81)
RDW: 16.7 % — ABNORMAL HIGH (ref 11.5–15.5)
WBC: 8.6 K/uL (ref 4.0–10.5)
nRBC: 0 % (ref 0.0–0.2)

## 2024-04-07 LAB — PROCALCITONIN: Procalcitonin: 0.13 ng/mL

## 2024-04-07 LAB — ECHOCARDIOGRAM COMPLETE
Area-P 1/2: 6.37 cm2
Est EF: <20
Height: 69 in
S' Lateral: 5.4 cm
Weight: 2408 [oz_av]

## 2024-04-07 LAB — BASIC METABOLIC PANEL WITH GFR
Anion gap: 11 (ref 5–15)
BUN: 12 mg/dL (ref 8–23)
CO2: 19 mmol/L — ABNORMAL LOW (ref 22–32)
Calcium: 8 mg/dL — ABNORMAL LOW (ref 8.9–10.3)
Chloride: 105 mmol/L (ref 98–111)
Creatinine, Ser: 1.02 mg/dL (ref 0.61–1.24)
GFR, Estimated: >60 mL/min (ref >60)
Glucose, Bld: 103 mg/dL — ABNORMAL HIGH (ref 70–99)
Potassium: 3.3 mmol/L — ABNORMAL LOW (ref 3.5–5.1)
Sodium: 135 mmol/L (ref 135–145)

## 2024-04-07 LAB — TROPONIN I (HIGH SENSITIVITY)
Troponin I (High Sensitivity): 1436 ng/L (ref <18)
Troponin I (High Sensitivity): 1303 ng/L (ref <18)

## 2024-04-07 LAB — LACTATE DEHYDROGENASE: LDH: 238 U/L — ABNORMAL HIGH (ref 98–192)

## 2024-04-07 LAB — PROTIME-INR
INR: 1.9 — ABNORMAL HIGH (ref 0.8–1.2)
Prothrombin Time: 22.5 s — ABNORMAL HIGH (ref 11.4–15.2)

## 2024-04-07 MED ORDER — FUROSEMIDE 10 MG/ML IJ SOLN
60.0000 mg | Freq: Once | INTRAMUSCULAR | Status: AC
  Administered 2024-04-07: 60 mg via INTRAVENOUS
  Filled 2024-04-07: qty 6

## 2024-04-07 MED ORDER — MAGNESIUM SULFATE 2 GM/50ML IV SOLN
2.0000 g | Freq: Once | INTRAVENOUS | Status: AC
  Administered 2024-04-07: 2 g via INTRAVENOUS
  Filled 2024-04-07: qty 50

## 2024-04-07 MED ORDER — POTASSIUM CHLORIDE CRYS ER 20 MEQ PO TBCR
40.0000 meq | EXTENDED_RELEASE_TABLET | Freq: Once | ORAL | Status: AC
  Administered 2024-04-07: 40 meq via ORAL
  Filled 2024-04-07: qty 2

## 2024-04-07 MED ORDER — WARFARIN SODIUM 5 MG PO TABS
5.0000 mg | ORAL_TABLET | Freq: Once | ORAL | Status: AC
  Administered 2024-04-07: 5 mg via ORAL
  Filled 2024-04-07: qty 1

## 2024-04-07 NOTE — TOC Initial Note (Signed)
 Transition of Care [facility]) - Initial/Assessment Note    Patient Details  Name: Steven [name] MRN: [id number] Date of Birth: 09/28/1955  [facility] CM/SW Contact:    Arlana JINNY Nicholaus ISRAEL Phone Number: [phone] 04/07/2024, 3:24 PM  Clinical Narrative:  HF CSW met with patient at bedside. Patient stated that he lives alone. Patient stated that he has history of HH services. Patient stated that he does not use any equipment. Patient stated that he has a scale at home. Patient stated that he has a PCP. CSW explained that a hospital follow up appointment is typically scheduled closer towards dc. Patient is agreeable, and requested afternoon appointments (1:30 PM). Patient drives and does not need transportation support at dc.   HF CSW/CM will continue follow and monitor for dc readiness.                        Patient Goals and CMS Choice            Expected Discharge Plan and Services                                              Prior Living Arrangements/Services                       Activities of Daily Living   ADL Screening (condition at time of admission) Independently performs ADLs?: Yes (appropriate for developmental age) Is the patient deaf or have difficulty hearing?: No Does the patient have difficulty seeing, even when wearing glasses/contacts?: No Does the patient have difficulty concentrating, remembering, or making decisions?: No  Permission Sought/Granted                  Emotional Assessment              Admission diagnosis:  SOB (shortness of breath) [R06.02] Hypoxia [R09.02] Pneumonia due to infectious organism, unspecified laterality, unspecified part of lung [J18.9] Acute hypoxic respiratory failure (HCC) [J96.01] Patient Active Problem List   Diagnosis Date Noted   Acute hypoxic respiratory failure (HCC) 04/06/2024   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) 03/18/2024   HFrEF (heart failure with  reduced ejection fraction) (HCC) 02/17/2024   Symptomatic anemia 02/13/2024   Supratherapeutic INR 02/13/2024   LVAD (left ventricular assist device) present (HCC) 02/13/2024   Fever 12/26/2023   Leg edema, left 12/26/2023   Hyponatremia 03/26/2023   Prediabetes 03/22/2023   Acute on chronic combined systolic and diastolic heart failure (HCC) 03/22/2023   Debility 03/20/2023   Thyroid  cyst 02/03/2022   Hypertension 08/08/2021   Alcohol abuse 08/08/2021   Goals of care, counseling/discussion 06/21/2021   Malignant neoplasm of prostate (HCC) 05/17/2021   Stage 3a chronic kidney disease (HCC) 11/20/2018   S/P subdural hematoma evacuation 03/04/2014   Long term current use of anticoagulant therapy 08/25/2013   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 [place] [address] Phone: [phone] Fax: [phone]  [facility] [address] [place] [address]  Phone: [phone] Fax: [phone]     Social Drivers of Health (SDOH) Social History: SDOH Screenings   Food Insecurity: No Food Insecurity (04/06/2024)  Housing: High Risk (04/06/2024)  Transportation Needs: No Transportation Needs (04/06/2024)  Utilities: Not At Risk (04/06/2024)  Alcohol Screen: Low Risk  (05/08/2022)  Depression (PHQ2-9): Low Risk  (01/24/2024)  Financial Resource Strain: Medium Risk (06/26/2023)  Social Connections: Socially Isolated (04/07/2024)  Tobacco Use: Medium Risk (04/07/2024)  Health Literacy: Adequate Health Literacy (07/25/2023)   SDOH Interventions:     Readmission Risk Interventions     No data to display

## 2024-04-07 NOTE — Discharge Summary (Incomplete)
 Advanced Heart Failure Team  Discharge Summary   Patient ID: Steven [name] MRN: [id number], DOB/AGE: 1956-05-13 68 y.o. Admit date: 04/06/2024 D/C date:     04/08/2024   Primary Discharge Diagnoses:  Acute Hypoxic Respiratory Failure  Acute on Chronic Combined Systolic and Diastolic Heart Failure HMIII LVAD  CAP   Secondary Discharge Diagnoses:  Elevated troponin w/ hx CAD   Hospital Course:   Patient with a PMH of chronic combined systolic and diastolic heart failure, CAD hx anterior MI in 2012 s/p DES to LAD, VT s/p [employer] ICD who underwent HM3 placement on 02/2023.    Patient recently hospitalized 8/25 for symptomatic low flows, his hemoglobin was found to be newly low and he was profoundly orthostatic.  He was admitted for GI workup that was largely negative, required multiple fluid boluses during the admission.  Further orthostatic symptoms in clinic and slow speed was dropped to 5400.  Underwent malignancy workup but thankfully returned negative after pet imaging.  Had been feeling better.   Admitted 04/06/24 with acute respiratory failure. In ED, he was noted to have elevated HS trop but no ischemic nor pleuritic chest pain ([id number]>>[id number]). This was felt to be d/t demand ischemia, not ACS. On examination, he was found to be volume overloaded and given IV Lasix . VAD speed was also increased to 5500. Additionally, given leukocytosis w/ WBC of 16K, he was empirically treated for CAP and started on azithromycin  + ceftriaxone  (to complete course of abx with po Augmentin  at discharge). Respiratory viral panel was negative. BCx were obtained. PE was also ruled out w/ negative chest CT.   He diuresed well w/ IV Lasix  and had significant improvement in symptoms. Kept on po lasix  20 mg every other day at discharge. Ramp echo was performed on 10/14. Speed increased to 5600   Close f/u in VAD clinic scheduled.   LVAD Interrogation HM III:   Speed: 5600    Flow: 4.8     PI:  2.6    Power:   4.2     Back-up speed:       Discharge Weight Range: 146 lb Discharge Vitals: Blood pressure (!) 79/67, pulse 79, temperature 97.7 F (36.5 C), temperature source Oral, resp. rate 19, height 5' 9 (1.753 m), weight 66.2 kg, SpO2 97%.  Labs: Lab Results  Component Value Date   WBC 7.5 04/08/2024   HGB 9.8 (L) 04/08/2024   HCT 30.4 (L) 04/08/2024   MCV 93.8 04/08/2024   PLT 230 04/08/2024    Recent Labs  Lab 04/06/24 0756 04/07/24 0212 04/08/24 0638  NA 141   < > 136  K 3.4*   < > 3.7  CL 107   < > 104  CO2 16*   < > 21*  BUN 10   < > 9  CREATININE 1.15   < > 0.94  CALCIUM  8.0*   < > 8.3*  PROT 7.1  --   --   BILITOT 0.8  --   --   ALKPHOS 95  --   --   ALT 17  --   --   AST 37  --   --   GLUCOSE 194*   < > 98   < > = values in this interval not displayed.   Lab Results  Component Value Date   CHOL 109 07/26/2023   HDL 44 07/26/2023   LDLCALC 50 07/26/2023   TRIG 74 07/26/2023   BNP (last 3 results) Recent Labs  04/06/24 0756  BNP 882.9*    ProBNP (last 3 results) No results for input(s): PROBNP in the last 8760 hours.   Diagnostic Studies/Procedures   ECHOCARDIOGRAM LIMITED Result Date: 04/08/2024    ECHOCARDIOGRAM LIMITED REPORT   Patient Name:   Steven [name] Date of Exam: 04/08/2024 Medical Rec #:  [id number]    Height:       69.0 in Accession #:    [id number]   Weight:       146.0 lb Date of Birth:  1956/04/20    BSA:          1.807 m Patient Age:    68 years     BP:           0/0 mmHg Patient Gender: M            HR:           75 bpm. Exam Location:  Inpatient Procedure: Limited Echo, Color Doppler and Cardiac Doppler (Both Spectral and            Color Flow Doppler were utilized during procedure). Indications:    I50.9* Heart failure (unspecified)  History:        Patient has prior history of Echocardiogram examinations, most                 recent 04/07/2024. CHF, CAD, Defibrillator; Risk                 Factors:Hypertension and ETOH. Heart Mate III  LVAD.  Sonographer:    Damien Senior RDCS Referring Phys: EZRA GORMAN SHUCK  Sonographer Comments: LVAD Ramp, Dr SHUCK at bedside IMPRESSIONS  1. LVAD Ramp study. 5500 rpm LVIDD 5.6 cm, AoV closed, moderate AI, no MR, septum R bowing, mild TR, moderate RV dilation with severe dysfunction. Final speed 5700 rpm, AoV closed with moderate AI, LVIDD 5.6 cm, septum more midline, no MR, mild TR, moderate RV dilation with severe dysfunction. Left ventricular ejection fraction, by estimation, is <20%. The left ventricle has severely decreased function.  2. Right ventricular systolic function is severely reduced. The right ventricular size is moderately enlarged. FINDINGS  Left Ventricle: LVAD Ramp study. 5500 rpm LVIDD 5.6 cm, AoV closed, moderate AI, no MR, septum R bowing, mild TR, moderate RV dilation with severe dysfunction. Final speed 5700 rpm, AoV closed with moderate AI, LVIDD 5.6 cm, septum more midline, no MR, mild TR, moderate RV dilation with severe dysfunction. Left ventricular ejection fraction, by estimation, is <20%. The left ventricle has severely decreased function. Right Ventricle: The right ventricular size is moderately enlarged. No increase in right ventricular wall thickness. Right ventricular systolic function is severely reduced. Pericardium: There is no evidence of pericardial effusion. Additional Comments: A device lead is visualized in the right atrium and right ventricle. Spectral Doppler performed. Color Doppler performed.  Darryle Decent MD Electronically signed by Darryle Decent MD Signature Date/Time: 04/08/2024/12:23:54 PM    Final    ECHOCARDIOGRAM COMPLETE Result Date: 04/07/2024    ECHOCARDIOGRAM REPORT   Patient Name:   Steven [name] [facility] Date of Exam: 04/07/2024 Medical Rec #:  [id number]    Height:       69.0 in Accession #:    [id number]   Weight:       150.5 lb Date of Birth:  Oct 01, 1955    BSA:          1.831 m Patient Age:    68 years     BP:  0/0 mmHg Patient Gender: M             HR:           77 bpm. Exam Location:  Inpatient Procedure: 2D Echo, Cardiac Doppler and Color Doppler (Both Spectral and Color            Flow Doppler were utilized during procedure). Indications:    Elevated Troponin                 LVAD  History:        Patient has prior history of Echocardiogram examinations, most                 recent 02/18/2024.  Sonographer:    Jayson Gaskins Referring Phys: [id number] BENJAMIN J STONER IMPRESSIONS  1. LVAD inflow catheter noted in LV apex (at 5500 RPM). Left ventricular ejection fraction, by estimation, is <20%. The left ventricle has severely decreased function. The left ventricle demonstrates global hypokinesis. The left ventricular internal cavity size was upper limit of normal. Left ventricular diastolic function could not be evaluated.  2. Right ventricular systolic function is mildly reduced. The right ventricular size is normal. Tricuspid regurgitation signal is inadequate for assessing PA pressure.  3. Left atrial size was mildly dilated.  4. Right atrial size was mild to moderately dilated.  5. The mitral valve is grossly normal. No evidence of mitral valve regurgitation.  6. Native aortic valve, AV does not open, moderate AR.  7. The inferior vena cava is normal in size with greater than 50% respiratory variability, suggesting right atrial pressure of 3 mmHg. Comparison(s): A prior study was performed on 02/18/2024. LVAD inflow catheter at apex, LVEF <20%, global hypokinesis, RV function moderately reduced, mild AR. FINDINGS  Left Ventricle: LVAD inflow catheter noted in LV apex (at 5500 RPM). Left ventricular ejection fraction, by estimation, is <20%. The left ventricle has severely decreased function. The left ventricle demonstrates global hypokinesis. The left ventricular  internal cavity size was upper limit of normal. There is no left ventricular hypertrophy. Left ventricular diastolic function could not be evaluated due to nondiagnostic images. Left ventricular  diastolic function could not be evaluated. Right Ventricle: The right ventricular size is normal. No increase in right ventricular wall thickness. Right ventricular systolic function is mildly reduced. Tricuspid regurgitation signal is inadequate for assessing PA pressure. Left Atrium: Left atrial size was mildly dilated. Right Atrium: Right atrial size was mild to moderately dilated. Pericardium: There is no evidence of pericardial effusion. Mitral Valve: The mitral valve is grossly normal. No evidence of mitral valve regurgitation. Tricuspid Valve: The tricuspid valve is grossly normal. Tricuspid valve regurgitation is trivial. Aortic Valve: Native aortic valve, AV does not open, moderate AR. Pulmonic Valve: The pulmonic valve was not well visualized. Pulmonic valve regurgitation is mild to moderate. Aorta: The aortic root is normal in size and structure. Venous: The inferior vena cava is normal in size with greater than 50% respiratory variability, suggesting right atrial pressure of 3 mmHg. IAS/Shunts: The atrial septum is grossly normal. Additional Comments: A device lead is visualized.  LEFT VENTRICLE PLAX 2D LVIDd:         5.80 cm LVIDs:         5.40 cm LV PW:         0.90 cm LV IVS:        0.50 cm  RIGHT VENTRICLE RV S prime:     8.27 cm/s TAPSE (M-mode): 1.4 cm LEFT ATRIUM  Index        RIGHT ATRIUM           Index LA Vol (A4C): 74.1 ml 40.47 ml/m  RA Area:     23.90 cm                                    RA Volume:   72.70 ml  39.71 ml/m  MITRAL VALVE MV Area (PHT): 6.37 cm MV Decel Time: 119 msec MV E velocity: 52.90 cm/s MV A velocity: 50.80 cm/s MV E/A ratio:  1.04 Sunit Tolia Electronically signed by Madonna Large Signature Date/Time: 04/07/2024/1:18:28 PM    Final    CT Angio Chest Pulmonary Embolism (PE) W or WO Contrast Result Date: 04/06/2024 CLINICAL DATA:  Acute hypoxic respiratory failure. EXAM: CT ANGIOGRAPHY CHEST WITH CONTRAST TECHNIQUE: Multidetector CT imaging of the chest was  performed using the standard protocol during bolus administration of intravenous contrast. Multiplanar CT image reconstructions and MIPs were obtained to evaluate the vascular anatomy. RADIATION DOSE REDUCTION: This exam was performed according to the departmental dose-optimization program which includes automated exposure control, adjustment of the mA and/or kV according to patient size and/or use of iterative reconstruction technique. CONTRAST:  75mL OMNIPAQUE  IOHEXOL  350 MG/ML SOLN COMPARISON:  February 29, 2024 FINDINGS: Cardiovascular: Satisfactory opacification of the pulmonary arteries to the segmental level. No evidence of pulmonary embolism. There is mild cardiomegaly with marked severity coronary artery calcification. A left ventricular assist device is present. No pericardial effusion. Mediastinum/Nodes: There is mild bilateral hilar lymphadenopathy. Thyroid  gland, trachea, and esophagus demonstrate no significant findings. Lungs/Pleura: There is evidence of centrilobular and paraseptal emphysematous lung disease. Moderate severity areas of scarring and/or atelectasis are seen within the posterior aspects of the bilateral lower lobes. There are small to moderate size bilateral pleural effusions. No pneumothorax is identified. Upper Abdomen: There is a small hiatal hernia. Musculoskeletal: Multiple sternal wires are seen. No acute osseous abnormalities are identified. Review of the MIP images confirms the above findings. IMPRESSION: 1. No evidence of pulmonary embolism. 2. Moderate severity bilateral lower lobe scarring and/or atelectasis. 3. Small to moderate size bilateral pleural effusions. 4. Mild cardiomegaly with marked severity coronary artery calcification. 5. Left ventricular assist device. 6. Small hiatal hernia. 7. Emphysema. Electronically Signed   By: Suzen Dials M.D.   On: 04/06/2024 16:09    Discharge Medications   Allergies as of 04/08/2024   No Known Allergies       Medication List     TAKE these medications    acetaminophen  500 MG tablet Commonly known as: TYLENOL  Take 1,000 mg by mouth daily as needed for mild pain (pain score 1-3) or moderate pain (pain score 4-6).   amoxicillin -clavulanate 875-125 MG tablet Commonly known as: AUGMENTIN  Take 1 tablet by mouth every 12 (twelve) hours. Start taking on: April 09, 2024   atorvastatin  80 MG tablet Commonly known as: LIPITOR  Take 1 tablet (80 mg total) by mouth daily.   CertaVite/Antioxidants Tabs Take 1 tablet by mouth daily.   docusate sodium  100 MG capsule Commonly known as: COLACE Take 2 capsules (200 mg total) by mouth daily. What changed:  when to take this reasons to take this   FeroSul 325 (65 Fe) MG tablet Generic drug: ferrous sulfate  Take 1 tablet (325 mg total) by mouth daily with breakfast.   finasteride  5 MG tablet Commonly known as: PROSCAR  Take 1 tablet (5 mg  total) by mouth daily.   furosemide  20 MG tablet Commonly known as: LASIX  Take 1 tablet (20 mg total) by mouth every other day. Start taking on: April 09, 2024   pantoprazole  40 MG tablet Commonly known as: PROTONIX  Take 1 tablet (40 mg total) by mouth daily.   tamsulosin  0.4 MG Caps capsule Commonly known as: FLOMAX  Take 1 capsule (0.4 mg total) by mouth daily.   traZODone  50 MG tablet Commonly known as: DESYREL  Take 1 tablet (50 mg total) by mouth at bedtime as needed for sleep.   warfarin 2.5 MG tablet Commonly known as: COUMADIN  Take as directed. If you are unsure how to take this medication, talk to your nurse or doctor. Original instructions: Take 2.5 mg (1 tablet) every Mon,Wed, Fri; 5 mg (2 tablets) all other days; or as directed by heart failure clinic        Disposition   The patient will be discharged in stable condition to home. Discharge Instructions     Diet - low sodium heart healthy   Complete by: As directed    Increase activity slowly   Complete by: As directed     Page VAD Coordinator at [phone]  Notify for: any VAD alarms, sustained elevations of power >10 watts, sustained drop in Pulse Index <3   Complete by: As directed    Notify for:  any VAD alarms sustained elevations of power >10 watts sustained drop in Pulse Index <3            APP Duration of Discharge Encounter: 40  Signed, FINCH, LINDSAY N  04/08/2024, 2:57 PM  Patient seen with NP, I formulated the plan and agree with the above note.   Please see my note from earlier today describing my assessment and plan.   MD time for discharge 31 minutes.   Ezra Shuck 04/08/2024 4:14 PM

## 2024-04-07 NOTE — Progress Notes (Addendum)
 Advanced Heart Failure VAD Team Note  PCP-Cardiologist: Debby Sor, MD (Inactive)  AHF: Dr. Cherrie   CC: A/c CHF   Patient Profile   68 y/o male w/ CAD, VT s/p ICD and chronic systolic heart failure s/p HM3 LVAD implanted on 03/08/23 by Dr Lucas under DT criteria, who p/w acute on chronic hypoxic respiratory failure 2/2 a/c CHF.   Subjective:    Hs Trop 1262>>1436>>1303  Chest CT negative for PE  Respiratory panel negative   On empiric abx for possible PNA. WBC 16>>8K.   4.3L in UOP yesterday w/ IV Lasix . Scr stable 1.02, K 3.3   VAD speed also increased to 5500 on admission.   Feeling better. Breathing improved and feels back to normal but still volume overloaded on exam.   INR 1.9    LVAD INTERROGATION:  HeartMate III LVAD:   Flow 4.3 liters/min, speed 5550, power 4.0, PI 4.8.  6 PI events.   Objective:    Vital Signs:   Temp:  [97.9 F (36.6 C)-98.4 F (36.9 C)] 98 F (36.7 C) (10/13 0819) Pulse Rate:  [48-93] 63 (10/13 0819) Resp:  [17-26] 17 (10/13 0819) BP: (91-127)/(67-93) 91/67 (10/13 0819) SpO2:  [90 %-100 %] 90 % (10/13 0819) Weight:  [68.3 kg] 68.3 kg (10/13 0409) Last BM Date : 04/05/24 Mean arterial Pressure 75  Intake/Output:   Intake/Output Summary (Last 24 hours) at 04/07/2024 1120 Last data filed at 04/07/2024 9177 Gross per 24 hour  Intake 1071.18 ml  Output 4320 ml  Net -3248.82 ml     Physical Exam    General:  fatigued appearing. No resp difficulty HEENT: normal Neck: JVD 12 cm  Cor: Mechanical heart sounds with LVAD hum present. Lungs: clear Abdomen: soft, nontender, nondistended. No hepatosplenomegaly. No bruits or masses. Good bowel sounds. Driveline: C/D/I; securement device intact and driveline incorporated Extremities: no cyanosis, clubbing, rash, edema Neuro: alert & orientedx3, cranial nerves grossly intact. moves all 4 extremities w/o difficulty. Affect pleasant   Telemetry   NSR 80s w/ 1 5 beat run of  NSVT   EKG    N/A   Labs   Basic Metabolic Panel: Recent Labs  Lab 04/06/24 0756 04/07/24 0212  NA 141 135  K 3.4* 3.3*  CL 107 105  CO2 16* 19*  GLUCOSE 194* 103*  BUN 10 12  CREATININE 1.15 1.02  CALCIUM  8.0* 8.0*  MG 1.6*  --     Liver Function Tests: Recent Labs  Lab 04/06/24 0756  AST 37  ALT 17  ALKPHOS 95  BILITOT 0.8  PROT 7.1  ALBUMIN  2.7*   No results for input(s): LIPASE, AMYLASE in the last 168 hours. No results for input(s): AMMONIA in the last 168 hours.  CBC: Recent Labs  Lab 04/06/24 0756 04/07/24 0212  WBC 16.0* 8.6  NEUTROABS 13.9*  --   HGB 10.3* 9.2*  HCT 34.3* 28.5*  MCV 102.1* 95.6  PLT 229 195    INR: Recent Labs  Lab 04/06/24 0756 04/07/24 0212  INR 1.6* 1.9*    Other results: EKG:    Imaging   CT Angio Chest Pulmonary Embolism (PE) W or WO Contrast Result Date: 04/06/2024 CLINICAL DATA:  Acute hypoxic respiratory failure. EXAM: CT ANGIOGRAPHY CHEST WITH CONTRAST TECHNIQUE: Multidetector CT imaging of the chest was performed using the standard protocol during bolus administration of intravenous contrast. Multiplanar CT image reconstructions and MIPs were obtained to evaluate the vascular anatomy. RADIATION DOSE REDUCTION: This exam was performed according to  the departmental dose-optimization program which includes automated exposure control, adjustment of the mA and/or kV according to patient size and/or use of iterative reconstruction technique. CONTRAST:  75mL OMNIPAQUE  IOHEXOL  350 MG/ML SOLN COMPARISON:  February 29, 2024 FINDINGS: Cardiovascular: Satisfactory opacification of the pulmonary arteries to the segmental level. No evidence of pulmonary embolism. There is mild cardiomegaly with marked severity coronary artery calcification. A left ventricular assist device is present. No pericardial effusion. Mediastinum/Nodes: There is mild bilateral hilar lymphadenopathy. Thyroid  gland, trachea, and esophagus demonstrate  no significant findings. Lungs/Pleura: There is evidence of centrilobular and paraseptal emphysematous lung disease. Moderate severity areas of scarring and/or atelectasis are seen within the posterior aspects of the bilateral lower lobes. There are small to moderate size bilateral pleural effusions. No pneumothorax is identified. Upper Abdomen: There is a small hiatal hernia. Musculoskeletal: Multiple sternal wires are seen. No acute osseous abnormalities are identified. Review of the MIP images confirms the above findings. IMPRESSION: 1. No evidence of pulmonary embolism. 2. Moderate severity bilateral lower lobe scarring and/or atelectasis. 3. Small to moderate size bilateral pleural effusions. 4. Mild cardiomegaly with marked severity coronary artery calcification. 5. Left ventricular assist device. 6. Small hiatal hernia. 7. Emphysema. Electronically Signed   By: Suzen Dials M.D.   On: 04/06/2024 16:09   DG Chest Portable 1 View Result Date: 04/06/2024 EXAM: 1 VIEW XRAY OF THE CHEST 04/06/2024 08:13:00 AM COMPARISON: None available. CLINICAL HISTORY: sob, hypoxia. Reason for exam: shortness of breath, hypoxia; Triage notes: Pt bib gcems from home.C/O SOB x2hrs. Pt has LVAD and says last time this happened he had to get lvad placed. Fire said pt was 82% room air, placed him on NRB. Pt arrives on 3L on at 95%. LVAD has not alarmed, reports no complications with ; LVAD, had it about a year. Denies any pain, does not wear O2 at home. FINDINGS: LINES, TUBES AND DEVICES: Left chest wall AICD is noted with leads in the right atrial appendage and right ventricle. Stable position of LVAD. Intact sternotomy wires LUNGS AND PLEURA: Increased bilateral pleural effusions. New increased interstitial markings compatible with pulmonary edema. Subsegmental atelectasis noted in the lung bases. No focal pulmonary opacity. No pneumothorax. HEART AND MEDIASTINUM: Unchanged cardiac enlargement. BONES AND SOFT TISSUES:  Visualized osseous structures are grossly intact. Artifact from overlying cardiac leads. IMPRESSION: 1. Increased bilateral pleural effusions and new interstitial markings compatible with pulmonary edema. Electronically signed by: Waddell Calk MD 04/06/2024 08:23 AM EDT RP Workstation: [id number]     Medications:     Scheduled Medications:  atorvastatin   80 mg Oral Daily   azithromycin   500 mg Oral Daily   ferrous sulfate   325 mg Oral Q breakfast   finasteride   5 mg Oral Daily   Influenza vac split trivalent PF  0.5 mL Intramuscular Tomorrow-1000   pantoprazole   40 mg Oral Daily   tamsulosin   0.4 mg Oral Daily   Warfarin - Pharmacist Dosing Inpatient   Does not apply q1600    Infusions:  cefTRIAXone  (ROCEPHIN )  IV      PRN Medications: acetaminophen , ondansetron  (ZOFRAN ) IV, traZODone   Assessment/Plan:    Acute on chronic hypoxic respiratory failure:  - d/t a/c CHF +/- possible CAP - symptomatically improved w/ diuresis and empiric abx. WBC 16>>8K  - Chest CT negative for PE. Respiratory Panel negative  - Troponin came back elevated with significant delta (see below)  - Lactic acid initially elevated on arrival, now improved - remains volume up, continue IV Lasix   -  continue Azithromycin  plus ceftriaxone . Check PTC and blood cx     Elevated troponin: Suspect type II NSTEMI in the setting of hypoxic respiratory failure, elevated lactic, lack of chest pain.  - Will obtain repeat echo - No current plan to cath given lack of anginal equivalent    Acute on chronic combined systolic and diastolic heart failure - Due to ICM with anterior MI in 2012 - Echo 02/27/23: EF <20%, LV with GHK, RV mildly reduced, GIIDD, LA mod dilated, mild MR.  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 GDMT limited by renal function and hypotension.  - S/P HM3 LVAD implant 03/08/23 - Volume up on CXR and exam, diuresing well w/ IV Lasix . C/w volume overload. 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  - No s/s angina - off asa as above.  - Continue statin   CKD stage 3: Improved - cardiorenal, resolved w/ LVAD support - Scr stable at 1.02   H/o VT s/p Medtronic ICD: likely scar mediated - No recent VT - off amio   Paroxysmal Atrial fibrillation:  - Remains in NSR. On warfarin  - INR management as above   LE DVT:  - Warfarin as above  Hypokalemia - K 3.3  - give K supp    I reviewed the LVAD parameters from today, and compared the results to the patient's prior recorded data.  No programming changes were made.  The LVAD is functioning within specified parameters.  The patient performs LVAD self-test daily.  LVAD interrogation was negative for any significant power changes, alarms or PI events/speed drops.  LVAD equipment check completed and is in good working order.  Back-up equipment present.   LVAD education done on emergency procedures and precautions and reviewed exit site care.  Length of Stay: 1  Caffie Shed, PA-C 04/07/2024, 11:20 AM  VAD Team --- VAD ISSUES ONLY--- Pager [phone] (7am - 7am)  Advanced Heart Failure Team  Pager [phone] (M-F; 7a - 5p)  Please contact [facility] for night-coverage after hours (5p -7a ) and weekends on [email or web address]  Patient seen with PA, agree with the above note.   He feels much better today after diuresis yesterday, had 4 L UOP with Lasix  40 mg IV x 1.  Speed was increased to 5500 rpm.  HS-TnI 1262 => 1436 => 1303.  BNP 883.   He was empirically started on ceftriaxone /azithromycin  for possible PNA.  CTA chest with no PE, there was bilateral lower lobe scarring/atelectasis.   General: Well appearing this am. NAD.  HEENT: Normal. Neck: Supple, JVP 10-12 cm. Carotids OK.  Cardiac:  Mechanical heart  sounds with LVAD hum present.  Lungs:  CTAB, normal effort.  Abdomen:  NT, ND, no HSM. No bruits or masses. +BS  LVAD exit site: Well-healed and incorporated. Dressing dry and intact. No erythema or drainage. Stabilization device present and accurately applied. Driveline dressing changed daily per sterile technique. Extremities:  Warm and dry. No cyanosis, clubbing, rash. 1+ ankle edema.  Neuro:  Alert & oriented x 3. Cranial nerves grossly intact. Moves all 4 extremities w/o difficulty. Affect pleasant    I suspect acute on chronic systolic CHF/volume overloaded triggered this admission.  No chest pain, elevated HS-TnI most likely demand ischemia  from volume overload.  He diuresed very well with IV lasix  dose yesterday (not on Lasix  at home).  Speed increased to 5500 rpm. Followup lactate yesterday was normal.  On exam today, I think he is still volume overloaded.  - Lasix  60 mg IV x 1 today.   - Probably will need po Lasix  at home, will have to be careful with history of orthostasis.   - Due to suction events, speed has been decreased in the past.  Increased to 5500 yesterday, no alarms overnight.  I will arrange for formal ramp echo assessment tomorrow after diuresis today.   Continue PNA coverage for now, but suspect that it was volume overload/CHF that triggered this admission.   Continue warfarin for INR goal 2-2.5.   Ezra Shuck 04/07/2024 11:58 AM

## 2024-04-07 NOTE — Progress Notes (Addendum)
 LVAD Coordinator Rounding Note:  Admitted 04/06/24 to HF service due to respiratory failure.   HM3 LVAD implanted on 03/08/23 by Dr Lucas under DT criteria.  Pt presented ER via EMS due to respiratory failure. Pt lying in bed on my arrival. Denies complaints this morning. Says his breathing is back to normal.  Vital signs: Temp: 98 HR: 66 paced Doppler Pressure: 76 Auto BP: 91/67 (75) O2 Sat: 92% on RA Wt: 150.5  lbs    LVAD interrogation reveals:  Speed: 5500 Flow: 4.8 Power: 4 w  PI: 3.2  Alarms: none  Events: 3-6 daily Hematocrit: 20-DO NOT CHANGE  Fixed speed: 5500 Low speed limit: 5200   Drive Line: Existing VAD dressing CDI. Anchor secure. Next dressing change due 04/08/24 bedside nurse or VAD coordinator.  Labs:  LDH trend: 238  INR trend: 1.9  Hgb: 9.2  Trop: 8737>8563  Anticoagulation Plan: -INR Goal: 2-2.5 -ASA Dose: off  Blood Products:   Device: -Medtronic -Therapies: ON  -VF > 200BMP  Arrythmias: hx afib  Infection:   Adverse Events on VAD:  Plan/Recommendations:  1. Please page VAD coordinator with any equipment issues or driveline problems. 2. Weekly dressing change by bedside nurse or VAD coordinator.  Lauraine Ip RN VAD Coordinator  Office: [phone]  24/7 Pager: [phone]

## 2024-04-07 NOTE — Progress Notes (Signed)
 ANTICOAGULATION CONSULT NOTE  Pharmacy Consult for Warfarin Indication: LVAD; Apical mural thrombus   No Known Allergies  Patient Measurements: Height: 5' 9 (175.3 cm) Weight: 68.3 kg (150 lb 8 oz) IBW/kg (Calculated) : 70.7  Vital Signs: Temp: 97.7 F (36.5 C) (10/13 1203) Temp Source: Oral (10/13 1203) BP: 107/76 (10/13 1200) Pulse Rate: 78 (10/13 1203)  Labs: Recent Labs    04/06/24 0756 04/06/24 0957 04/07/24 0212 04/07/24 0830 04/07/24 1013  HGB 10.3*  --  9.2*  --   --   HCT 34.3*  --  28.5*  --   --   PLT 229  --  195  --   --   LABPROT 19.5*  --  22.5*  --   --   INR 1.6*  --  1.9*  --   --   CREATININE 1.15  --  1.02  --   --   TROPONINIHS 54* 1,262*  --  1,436* 1,303*    Estimated Creatinine Clearance: 66 mL/min (by C-G formula based on SCr of 1.02 mg/dL).   Medical History: Past Medical History:  Diagnosis Date   Acute MI, anterior wall (HCC)    AICD (automatic cardioverter/defibrillator) present    CAD (coronary artery disease)    2D ECHO, 07/13/2011 - EF <25%, LV moderatelty dilated, LA moderately dilatedLEXISCAN, 12/14/2011 - moderate-severe perfusion defect seen in the basal anteroseptal, mid anterior, apicacl anterior, apical, apical inferior, and apical lateral regions, post-stress EF 25%, new EKG changes from baseline abnormalities   Cancer (HCC)    Prostate   CHF (congestive heart failure) (HCC) 2012   Hypertension 08/08/2021   Inguinal hernia, left    Pneumonia    November 2023   Pre-diabetes     Medications:  Medications Prior to Admission  Medication Sig Dispense Refill Last Dose/Taking   acetaminophen  (TYLENOL ) 500 MG tablet Take 1,000 mg by mouth daily as needed for mild pain (pain score 1-3) or moderate pain (pain score 4-6).   Unknown   atorvastatin  (LIPITOR ) 80 MG tablet Take 1 tablet (80 mg total) by mouth daily. 90 tablet 3 04/05/2024 Noon   docusate sodium  (COLACE) 100 MG capsule Take 2 capsules (200 mg total) by mouth daily.  (Patient taking differently: Take 200 mg by mouth daily as needed for mild constipation.) 100 capsule 0 Unknown   ferrous sulfate  325 (65 FE) MG tablet Take 1 tablet (325 mg total) by mouth daily with breakfast. 30 tablet 3 04/05/2024 Noon   finasteride  (PROSCAR ) 5 MG tablet Take 1 tablet (5 mg total) by mouth daily. 90 tablet 3 04/05/2024 Noon   Multiple Vitamins-Minerals (CERTAVITE/ANTIOXIDANTS) TABS Take 1 tablet by mouth daily. 130 tablet 0 04/05/2024 Noon   pantoprazole  (PROTONIX ) 40 MG tablet Take 1 tablet (40 mg total) by mouth daily. 30 tablet 6 04/05/2024 Noon   tamsulosin  (FLOMAX ) 0.4 MG CAPS capsule Take 1 capsule (0.4 mg total) by mouth daily. 90 capsule 3 04/05/2024 Evening   traZODone  (DESYREL ) 50 MG tablet Take 1 tablet (50 mg total) by mouth at bedtime as needed for sleep. 30 tablet 3 Unknown   warfarin (COUMADIN ) 2.5 MG tablet Take 2.5 mg (1 tablet) every Mon,Wed, Fri; 5 mg (2 tablets) all other days; or as directed by heart failure clinic 60 tablet 6 04/05/2024 at  8:00 PM   Scheduled:   atorvastatin   80 mg Oral Daily   azithromycin   500 mg Oral Daily   ferrous sulfate   325 mg Oral Q breakfast   finasteride   5 mg Oral Daily   pantoprazole   40 mg Oral Daily   tamsulosin   0.4 mg Oral Daily   warfarin  5 mg Oral ONCE-1600   Warfarin - Pharmacist Dosing Inpatient   Does not apply q1600   Infusions:   cefTRIAXone  (ROCEPHIN )  IV     PRN: acetaminophen , ondansetron  (ZOFRAN ) IV, traZODone   Assessment: 63 yom with a history of HF 2/2 ICM, CAD, VT s/p medtronic ICD, HLD, apical mural thrombus, CKD, h/o subdural hematoma, and HM-3 VAD. Patient is presenting with SOB. Warfarin per pharmacy consult placed for LVAD; Apical mural thrombus.  Patient taking warfarin prior to arrival. Home dose is 2.5 mg (2.5 mg x 1) every Mon, Wed, Fri; 5 mg (2.5 mg x 2) all other days . Last taken 10/11 8pm.  INR 1.9, which is slightly sub-therapeutic Hgb 9.2; plt 229  Goal of Therapy:  INR Goal  2-2.5 Monitor platelets by anticoagulation protocol: Yes   Plan:  Give 5 mg warfarin tonight - repeat dosing per INR Monitor for s/s of hemorrhage, daily INR, CBC  Harlene Barlow, Berdine BIRCH, BCPS, BCCP Clinical Pharmacist  04/07/2024 2:24 PM   [facility] pharmacy phone numbers are listed on [email or web address]

## 2024-04-08 ENCOUNTER — Inpatient Hospital Stay (HOSPITAL_COMMUNITY)

## 2024-04-08 ENCOUNTER — Other Ambulatory Visit (HOSPITAL_COMMUNITY): Payer: Self-pay

## 2024-04-08 DIAGNOSIS — J9621 Acute and chronic respiratory failure with hypoxia: Secondary | ICD-10-CM | POA: Diagnosis not present

## 2024-04-08 DIAGNOSIS — I5043 Acute on chronic combined systolic (congestive) and diastolic (congestive) heart failure: Secondary | ICD-10-CM

## 2024-04-08 DIAGNOSIS — Z95811 Presence of heart assist device: Secondary | ICD-10-CM | POA: Diagnosis not present

## 2024-04-08 LAB — RESPIRATORY PANEL BY PCR

## 2024-04-08 LAB — BASIC METABOLIC PANEL WITH GFR
Anion gap: 11 (ref 5–15)
BUN: 9 mg/dL (ref 8–23)
CO2: 21 mmol/L — ABNORMAL LOW (ref 22–32)
Calcium: 8.3 mg/dL — ABNORMAL LOW (ref 8.9–10.3)
Chloride: 104 mmol/L (ref 98–111)
Creatinine, Ser: 0.94 mg/dL (ref 0.61–1.24)
GFR, Estimated: >60 mL/min (ref >60)
Glucose, Bld: 98 mg/dL (ref 70–99)
Potassium: 3.7 mmol/L (ref 3.5–5.1)
Sodium: 136 mmol/L (ref 135–145)

## 2024-04-08 LAB — ECHOCARDIOGRAM LIMITED
Est EF: <20
Height: 69 in
Weight: 2336 [oz_av]

## 2024-04-08 LAB — CBC
HCT: 30.4 % — ABNORMAL LOW (ref 39.0–52.0)
Hemoglobin: 9.8 g/dL — ABNORMAL LOW (ref 13.0–17.0)
MCH: 30.2 pg (ref 26.0–34.0)
MCHC: 32.2 g/dL (ref 30.0–36.0)
MCV: 93.8 fL (ref 80.0–100.0)
Platelets: 230 K/uL (ref 150–400)
RBC: 3.24 MIL/uL — ABNORMAL LOW (ref 4.22–5.81)
RDW: 16.7 % — ABNORMAL HIGH (ref 11.5–15.5)
WBC: 7.5 K/uL (ref 4.0–10.5)
nRBC: 0 % (ref 0.0–0.2)

## 2024-04-08 LAB — LACTATE DEHYDROGENASE: LDH: 219 U/L — ABNORMAL HIGH (ref 98–192)

## 2024-04-08 LAB — PROTIME-INR
INR: 2.1 — ABNORMAL HIGH (ref 0.8–1.2)
Prothrombin Time: 24.6 s — ABNORMAL HIGH (ref 11.4–15.2)

## 2024-04-08 MED ORDER — POTASSIUM CHLORIDE CRYS ER 20 MEQ PO TBCR
40.0000 meq | EXTENDED_RELEASE_TABLET | Freq: Once | ORAL | Status: AC
  Administered 2024-04-08: 40 meq via ORAL
  Filled 2024-04-08: qty 2

## 2024-04-08 MED ORDER — FUROSEMIDE 20 MG PO TABS
20.0000 mg | ORAL_TABLET | ORAL | 3 refills | Status: DC
  Filled 2024-04-08: qty 20, 40d supply, fill #0

## 2024-04-08 MED ORDER — AMOXICILLIN-POT CLAVULANATE 875-125 MG PO TABS
1.0000 | ORAL_TABLET | Freq: Two times a day (BID) | ORAL | 0 refills | Status: AC
  Filled 2024-04-08: qty 4, 2d supply, fill #0

## 2024-04-08 MED ORDER — AMOXICILLIN-POT CLAVULANATE 875-125 MG PO TABS: 1.0000 | ORAL_TABLET | Freq: Two times a day (BID) | ORAL | Status: DC

## 2024-04-08 MED ORDER — WARFARIN SODIUM 5 MG PO TABS
5.0000 mg | ORAL_TABLET | Freq: Once | ORAL | Status: AC
  Administered 2024-04-08: 5 mg via ORAL
  Filled 2024-04-08: qty 1

## 2024-04-08 MED ORDER — FUROSEMIDE 20 MG PO TABS: 20.0000 mg | ORAL_TABLET | ORAL | Status: DC

## 2024-04-08 NOTE — Progress Notes (Signed)
 LVAD Coordinator Rounding Note:  Admitted 04/06/24 to HF service due to respiratory failure.   HM3 LVAD implanted on 03/08/23 by Dr Lucas under DT criteria.  Pt presented ER via EMS due to respiratory failure. Pt lying in bed on my arrival. Denies complaints this morning. Says his breathing is back to normal. Ramp echo performed at bedside speed increased to 5600 see separate note for details.   Plan for discharge this afternoon. Drive line dressing changed by bedside RN.   Hospital f/u scheduled in VAD Clinic.  Vital signs: Temp: 98.3 HR: 77 Doppler Pressure: 64 Auto BP: 82/68 (75) O2 Sat: 98% on RA Wt: 150.5>146  lbs    LVAD interrogation reveals:  Speed: 5500 Flow: 4.7 Power: 4.0 w  PI: 2.5  Alarms: none  Events: 20 PI events today Hematocrit: 20-DO NOT CHANGE  Fixed speed: 5500>>5600 Low speed limit: 5200>>5300   Drive Line: Existing VAD dressing changed by bedside RN. Dressing CDI. Anchor secure. Next dressing change due 04/15/24 bedside nurse or VAD coordinator.  Labs:  LDH trend: 238>219  INR trend: 1.9>2.1  Hgb: 9.2>9.8  Trop: 8737>8563  Anticoagulation Plan: -INR Goal: 2-2.5 -ASA Dose: off  Blood Products:   Device: -Medtronic -Therapies: ON  -VF > 200BMP  Arrythmias: hx afib  Infection:   Adverse Events on VAD:  Plan/Recommendations:  1. Please page VAD coordinator with any equipment issues or driveline problems. 2. Weekly dressing change by bedside nurse or VAD coordinator.  Schuyler Lunger RN, BSN VAD Coordinator 24/7 Pager [phone]

## 2024-04-08 NOTE — Progress Notes (Signed)
 Speed  Flow  PI  Power  LVIDD  AI  Aortic opening MR  TR  Septum  RV  VTI (>18cm)  5500 4.7 2.5 4.0 5.6 mild 0/5 mild none Pulling right mild  6.9  5600  4.9 2.2 4.2 5.6 mild 0/5 none none midline mild 7.8   5700 5.1 2.1 4.3 5.6 Mild-mod 0/5 none none midline mild 8.5                                             Doppler MAP:  Auto cuff BP: 82/68 (75)   Ramp ECHO performed at bedside per Dr.McLean  At completion of ramp study, patients primary controller programmed:  Fixed speed:5600 Low speed limit:5300   Schuyler Gladis PEAK, BSN VAD Coordinator 24/7 Pager [phone]

## 2024-04-08 NOTE — Progress Notes (Signed)
 Mobility Specialist Progress Note;   04/08/24 1201  Mobility  Activity Ambulated independently  Level of Assistance Standby assist, set-up cues, supervision of patient - no hands on  Assistive Device None  Distance Ambulated (ft) 400 ft  Activity Response Tolerated well  Mobility Referral Yes  Mobility visit 1 Mobility  Mobility Specialist Start Time (ACUTE ONLY) 1201  Mobility Specialist Stop Time (ACUTE ONLY) 1215  Mobility Specialist Time Calculation (min) (ACUTE ONLY) 14 min   RN requesting pt to ambulate, pt agreeable. Required no physical assistance during ambulation, SV for safety. No c/o lightheadedness when asked. VSS throughout. Pt returned back to bed and left with all needs met.   Lauraine Erm Mobility Specialist Please contact via SecureChat or [employer] [phone]

## 2024-04-08 NOTE — TOC Transition Note (Signed)
 Transition of Care [facility]) - Discharge Note   Patient Details  Name: Steven [name] MRN: [id number] Date of Birth: 17-Feb-1956  [facility]) CM/SW Contact:  Justina Delcia Czar, RN Phone Number: [phone] 04/08/2024, 2:50 PM   Clinical Narrative:     Hospital follow up appt scheduled for 04/15/2024 at 1:50 pm.   Final next level of care: Home/Self Care Barriers to Discharge: No Barriers Identified   Patient Goals and CMS Choice            Discharge Placement                Discharge Plan and Services Additional resources added to the After Visit Summary for     Discharge Planning Services: CM Consult              Social Drivers of Health (SDOH) Interventions SDOH Screenings   Food Insecurity: No Food Insecurity (04/06/2024)  Housing: High Risk (04/06/2024)  Transportation Needs: No Transportation Needs (04/06/2024)  Utilities: Not At Risk (04/06/2024)  Alcohol Screen: Low Risk  (05/08/2022)  Depression (PHQ2-9): Low Risk  (01/24/2024)  Financial Resource Strain: Medium Risk (06/26/2023)  Social Connections: Socially Isolated (04/07/2024)  Tobacco Use: Medium Risk (04/07/2024)  Health Literacy: Adequate Health Literacy (07/25/2023)     Readmission Risk Interventions     No data to display

## 2024-04-08 NOTE — Progress Notes (Signed)
 ANTICOAGULATION CONSULT NOTE  Pharmacy Consult for Warfarin Indication: LVAD; Apical mural thrombus   No Known Allergies  Patient Measurements: Height: 5' 9 (175.3 cm) Weight: 66.2 kg (146 lb) IBW/kg (Calculated) : 70.7  Vital Signs: Temp: 98.3 F (36.8 C) (10/14 0830) Temp Source: Oral (10/14 0830) BP: 82/68 (10/14 0830) Pulse Rate: 80 (10/14 0830)  Labs: Recent Labs    04/06/24 0756 04/06/24 0957 04/07/24 0212 04/07/24 0830 04/07/24 1013 04/08/24 0638  HGB 10.3*  --  9.2*  --   --  9.8*  HCT 34.3*  --  28.5*  --   --  30.4*  PLT 229  --  195  --   --  230  LABPROT 19.5*  --  22.5*  --   --  24.6*  INR 1.6*  --  1.9*  --   --  2.1*  CREATININE 1.15  --  1.02  --   --  0.94  TROPONINIHS 54* 1,262*  --  1,436* 1,303*  --     Estimated Creatinine Clearance: 69.4 mL/min (by C-G formula based on SCr of 0.94 mg/dL).   Medical History: Past Medical History:  Diagnosis Date   Acute MI, anterior wall (HCC)    AICD (automatic cardioverter/defibrillator) present    CAD (coronary artery disease)    2D ECHO, 07/13/2011 - EF <25%, LV moderatelty dilated, LA moderately dilatedLEXISCAN, 12/14/2011 - moderate-severe perfusion defect seen in the basal anteroseptal, mid anterior, apicacl anterior, apical, apical inferior, and apical lateral regions, post-stress EF 25%, new EKG changes from baseline abnormalities   Cancer (HCC)    Prostate   CHF (congestive heart failure) (HCC) 2012   Hypertension 08/08/2021   Inguinal hernia, left    Pneumonia    November 2023   Pre-diabetes     Medications:  Medications Prior to Admission  Medication Sig Dispense Refill Last Dose/Taking   acetaminophen  (TYLENOL ) 500 MG tablet Take 1,000 mg by mouth daily as needed for mild pain (pain score 1-3) or moderate pain (pain score 4-6).   Unknown   atorvastatin  (LIPITOR ) 80 MG tablet Take 1 tablet (80 mg total) by mouth daily. 90 tablet 3 04/05/2024 Noon   docusate sodium  (COLACE) 100 MG capsule  Take 2 capsules (200 mg total) by mouth daily. (Patient taking differently: Take 200 mg by mouth daily as needed for mild constipation.) 100 capsule 0 Unknown   ferrous sulfate  325 (65 FE) MG tablet Take 1 tablet (325 mg total) by mouth daily with breakfast. 30 tablet 3 04/05/2024 Noon   finasteride  (PROSCAR ) 5 MG tablet Take 1 tablet (5 mg total) by mouth daily. 90 tablet 3 04/05/2024 Noon   Multiple Vitamins-Minerals (CERTAVITE/ANTIOXIDANTS) TABS Take 1 tablet by mouth daily. 130 tablet 0 04/05/2024 Noon   pantoprazole  (PROTONIX ) 40 MG tablet Take 1 tablet (40 mg total) by mouth daily. 30 tablet 6 04/05/2024 Noon   tamsulosin  (FLOMAX ) 0.4 MG CAPS capsule Take 1 capsule (0.4 mg total) by mouth daily. 90 capsule 3 04/05/2024 Evening   traZODone  (DESYREL ) 50 MG tablet Take 1 tablet (50 mg total) by mouth at bedtime as needed for sleep. 30 tablet 3 Unknown   warfarin (COUMADIN ) 2.5 MG tablet Take 2.5 mg (1 tablet) every Mon,Wed, Fri; 5 mg (2 tablets) all other days; or as directed by heart failure clinic 60 tablet 6 04/05/2024 at  8:00 PM   Scheduled:   atorvastatin   80 mg Oral Daily   ferrous sulfate   325 mg Oral Q breakfast   finasteride   5 mg Oral Daily   pantoprazole   40 mg Oral Daily   tamsulosin   0.4 mg Oral Daily   Warfarin - Pharmacist Dosing Inpatient   Does not apply q1600   Infusions:   cefTRIAXone  (ROCEPHIN )  IV Stopped (04/07/24 1532)   PRN: acetaminophen , ondansetron  (ZOFRAN ) IV, traZODone   Assessment: 36 yom with a history of HF 2/2 ICM, CAD, VT s/p medtronic ICD, HLD, apical mural thrombus, CKD, h/o subdural hematoma, and HM-3 VAD. Patient is presenting with SOB. Warfarin per pharmacy consult placed for LVAD; Apical mural thrombus.  Patient taking warfarin prior to arrival. Home dose is 2.5 mg (2.5 mg x 1) every Mon, Wed, Fri; 5 mg (2.5 mg x 2) all other days . Last taken 10/11 8pm.  10/14: INR 2.1, which is therapeutic. Hgb 9.8; plt 230.  Goal of Therapy:  INR Goal  2-2.5 Monitor platelets by anticoagulation protocol: Yes   Plan:  Give 5 mg warfarin tonight Monitor for s/s of hemorrhage, daily INR, CBC Recommend resuming PTA regimen at discharge (2.5 mg MWF, and 5 mg EOD)  Morna Breach, PharmD PGY2 Cardiology Pharmacy Resident 04/08/2024 11:12 AM  [facility] pharmacy phone numbers are listed on [email or web address]

## 2024-04-08 NOTE — Progress Notes (Signed)
 Patient ID: Steven [name], male   DOB: Aug 09, 1955, 68 y.o.   MRN: [id number]   Advanced Heart Failure VAD Team Note  PCP-Cardiologist: Debby Sor, MD (Inactive)  AHF: Dr. Cherrie   CC: A/c CHF   Patient Profile   68 y/o male w/ CAD, VT s/p ICD and chronic systolic heart failure s/p HM3 LVAD implanted on 03/08/23 by Dr Lucas under DT criteria, who p/w acute on chronic hypoxic respiratory failure 2/2 a/c CHF.   Subjective:    Hs Trop 1262>>1436>>1303  Chest CTA negative for PE  Respiratory panel negative   On empiric abx for possible PNA. WBC 16>>8K>>7.5.   Good UOP yesterday with IV Lasix , weight down 4 lbs.  Creatinine stable at 0.94.   Ramp echo done today, speed left at 5600 (looked ok at 5700 but had had to decrease from this level in the past).  RV only mildly dysfunctional, mild AI present, aortic valve does not open.   Breathing much improved, wants to go home.   LVAD INTERROGATION:  HeartMate III LVAD:   Flow 4.8 liters/min, speed 5600, power 4.2, PI 2.6.  20 PI events.   Objective:    Vital Signs:   Temp:  [97.7 F (36.5 C)-98.7 F (37.1 C)] 98.3 F (36.8 C) (10/14 0830) Pulse Rate:  [65-83] 80 (10/14 0830) Resp:  [17-20] 20 (10/14 0830) BP: (82-107)/(62-76) 82/68 (10/14 0830) SpO2:  [97 %-98 %] 98 % (10/14 0830) Weight:  [66.2 kg] 66.2 kg (10/14 0500) Last BM Date : 04/05/24 Mean arterial Pressure 70s  Intake/Output:   Intake/Output Summary (Last 24 hours) at 04/08/2024 1120 Last data filed at 04/08/2024 0831 Gross per 24 hour  Intake 820 ml  Output 4475 ml  Net -3655 ml     Physical Exam    General: Well appearing this am. NAD.  HEENT: Normal. Neck: Supple, JVP 7-8 cm. Carotids OK.  Cardiac:  Mechanical heart sounds with LVAD hum present.  Lungs:  CTAB, normal effort.  Abdomen:  NT, ND, no HSM. No bruits or masses. +BS  LVAD exit site: Well-healed and incorporated. Dressing dry and intact. No erythema or drainage. Stabilization device present  and accurately applied. Driveline dressing changed daily per sterile technique. Extremities:  Warm and dry. No cyanosis, clubbing, rash, or edema.  Neuro:  Alert & oriented x 3. Cranial nerves grossly intact. Moves all 4 extremities w/o difficulty. Affect pleasant    Telemetry   NSR 70s (personally reviewed)  EKG    N/A   Labs   Basic Metabolic Panel: Recent Labs  Lab 04/06/24 0756 04/07/24 0212 04/08/24 0638  NA 141 135 136  K 3.4* 3.3* 3.7  CL 107 105 104  CO2 16* 19* 21*  GLUCOSE 194* 103* 98  BUN 10 12 9   CREATININE 1.15 1.02 0.94  CALCIUM  8.0* 8.0* 8.3*  MG 1.6*  --   --     Liver Function Tests: Recent Labs  Lab 04/06/24 0756  AST 37  ALT 17  ALKPHOS 95  BILITOT 0.8  PROT 7.1  ALBUMIN  2.7*   No results for input(s): LIPASE, AMYLASE in the last 168 hours. No results for input(s): AMMONIA in the last 168 hours.  CBC: Recent Labs  Lab 04/06/24 0756 04/07/24 0212 04/08/24 0638  WBC 16.0* 8.6 7.5  NEUTROABS 13.9*  --   --   HGB 10.3* 9.2* 9.8*  HCT 34.3* 28.5* 30.4*  MCV 102.1* 95.6 93.8  PLT 229 195 230    INR: Recent  Labs  Lab 04/06/24 0756 04/07/24 0212 04/08/24 0638  INR 1.6* 1.9* 2.1*    Other results: EKG:    Imaging   ECHOCARDIOGRAM COMPLETE Result Date: 04/07/2024    ECHOCARDIOGRAM REPORT   Patient Name:   Steven [name] [facility] Date of Exam: 04/07/2024 Medical Rec #:  [id number]    Height:       69.0 in Accession #:    [id number]   Weight:       150.5 lb Date of Birth:  [birth year]    BSA:          1.831 m Patient Age:    68 years     BP:           0/0 mmHg Patient Gender: M            HR:           77 bpm. Exam Location:  Inpatient Procedure: 2D Echo, Cardiac Doppler and Color Doppler (Both Spectral and Color            Flow Doppler were utilized during procedure). Indications:    Elevated Troponin                 LVAD  History:        Patient has prior history of Echocardiogram examinations, most                 recent 02/18/2024.   Sonographer:    Jayson Gaskins Referring Phys: [id number] BENJAMIN J STONER IMPRESSIONS  1. LVAD inflow catheter noted in LV apex (at 5500 RPM). Left ventricular ejection fraction, by estimation, is <20%. The left ventricle has severely decreased function. The left ventricle demonstrates global hypokinesis. The left ventricular internal cavity size was upper limit of normal. Left ventricular diastolic function could not be evaluated.  2. Right ventricular systolic function is mildly reduced. The right ventricular size is normal. Tricuspid regurgitation signal is inadequate for assessing PA pressure.  3. Left atrial size was mildly dilated.  4. Right atrial size was mild to moderately dilated.  5. The mitral valve is grossly normal. No evidence of mitral valve regurgitation.  6. Native aortic valve, AV does not open, moderate AR.  7. The inferior vena cava is normal in size with greater than 50% respiratory variability, suggesting right atrial pressure of 3 mmHg. Comparison(s): A prior study was performed on 02/18/2024. LVAD inflow catheter at apex, LVEF <20%, global hypokinesis, RV function moderately reduced, mild AR. FINDINGS  Left Ventricle: LVAD inflow catheter noted in LV apex (at 5500 RPM). Left ventricular ejection fraction, by estimation, is <20%. The left ventricle has severely decreased function. The left ventricle demonstrates global hypokinesis. The left ventricular  internal cavity size was upper limit of normal. There is no left ventricular hypertrophy. Left ventricular diastolic function could not be evaluated due to nondiagnostic images. Left ventricular diastolic function could not be evaluated. Right Ventricle: The right ventricular size is normal. No increase in right ventricular wall thickness. Right ventricular systolic function is mildly reduced. Tricuspid regurgitation signal is inadequate for assessing PA pressure. Left Atrium: Left atrial size was mildly dilated. Right Atrium: Right atrial size  was mild to moderately dilated. Pericardium: There is no evidence of pericardial effusion. Mitral Valve: The mitral valve is grossly normal. No evidence of mitral valve regurgitation. Tricuspid Valve: The tricuspid valve is grossly normal. Tricuspid valve regurgitation is trivial. Aortic Valve: Native aortic valve, AV does not open, moderate AR. Pulmonic Valve: The pulmonic valve was  not well visualized. Pulmonic valve regurgitation is mild to moderate. Aorta: The aortic root is normal in size and structure. Venous: The inferior vena cava is normal in size with greater than 50% respiratory variability, suggesting right atrial pressure of 3 mmHg. IAS/Shunts: The atrial septum is grossly normal. Additional Comments: A device lead is visualized.  LEFT VENTRICLE PLAX 2D LVIDd:         5.80 cm LVIDs:         5.40 cm LV PW:         0.90 cm LV IVS:        0.50 cm  RIGHT VENTRICLE RV S prime:     8.27 cm/s TAPSE (M-mode): 1.4 cm LEFT ATRIUM           Index        RIGHT ATRIUM           Index LA Vol (A4C): 74.1 ml 40.47 ml/m  RA Area:     23.90 cm                                    RA Volume:   72.70 ml  39.71 ml/m  MITRAL VALVE MV Area (PHT): 6.37 cm MV Decel Time: 119 msec MV E velocity: 52.90 cm/s MV A velocity: 50.80 cm/s MV E/A ratio:  1.04 Sunit Tolia Electronically signed by Madonna Large Signature Date/Time: 04/07/2024/1:18:28 PM    Final    CT Angio Chest Pulmonary Embolism (PE) W or WO Contrast Result Date: 04/06/2024 CLINICAL DATA:  Acute hypoxic respiratory failure. EXAM: CT ANGIOGRAPHY CHEST WITH CONTRAST TECHNIQUE: Multidetector CT imaging of the chest was performed using the standard protocol during bolus administration of intravenous contrast. Multiplanar CT image reconstructions and MIPs were obtained to evaluate the vascular anatomy. RADIATION DOSE REDUCTION: This exam was performed according to the departmental dose-optimization program which includes automated exposure control, adjustment of the mA  and/or kV according to patient size and/or use of iterative reconstruction technique. CONTRAST:  75mL OMNIPAQUE  IOHEXOL  350 MG/ML SOLN COMPARISON:  February 29, 2024 FINDINGS: Cardiovascular: Satisfactory opacification of the pulmonary arteries to the segmental level. No evidence of pulmonary embolism. There is mild cardiomegaly with marked severity coronary artery calcification. A left ventricular assist device is present. No pericardial effusion. Mediastinum/Nodes: There is mild bilateral hilar lymphadenopathy. Thyroid  gland, trachea, and esophagus demonstrate no significant findings. Lungs/Pleura: There is evidence of centrilobular and paraseptal emphysematous lung disease. Moderate severity areas of scarring and/or atelectasis are seen within the posterior aspects of the bilateral lower lobes. There are small to moderate size bilateral pleural effusions. No pneumothorax is identified. Upper Abdomen: There is a small hiatal hernia. Musculoskeletal: Multiple sternal wires are seen. No acute osseous abnormalities are identified. Review of the MIP images confirms the above findings. IMPRESSION: 1. No evidence of pulmonary embolism. 2. Moderate severity bilateral lower lobe scarring and/or atelectasis. 3. Small to moderate size bilateral pleural effusions. 4. Mild cardiomegaly with marked severity coronary artery calcification. 5. Left ventricular assist device. 6. Small hiatal hernia. 7. Emphysema. Electronically Signed   By: Suzen Dials M.D.   On: 04/06/2024 16:09     Medications:     Scheduled Medications:  atorvastatin   80 mg Oral Daily   ferrous sulfate   325 mg Oral Q breakfast   finasteride   5 mg Oral Daily   pantoprazole   40 mg Oral Daily   potassium chloride   40 mEq  Oral Once   tamsulosin   0.4 mg Oral Daily   Warfarin - Pharmacist Dosing Inpatient   Does not apply q1600    Infusions:  cefTRIAXone  (ROCEPHIN )  IV Stopped (04/07/24 1532)    PRN Medications: acetaminophen , ondansetron   (ZOFRAN ) IV, traZODone   Assessment/Plan:    Acute on chronic hypoxic respiratory failure:  - d/t a/c CHF +/- possible CAP - symptomatically improved w/ diuresis and empiric abx. WBC 16>>8K  - Chest CT negative for PE. Respiratory Panel negative  - Troponin came back elevated with significant delta (see below)  - Lactic acid initially elevated on arrival, now improved - I suspect acute on chronic systolic CHF/volume overloaded triggered this admission.  No chest pain, elevated HS-TnI most likely demand ischemia from volume overload.  He diuresed very well with IV lasix  over the last 2 days.  Now looks euvolemic.  - No IV Lasix  today, will start Lasix  20 mg every other day for home (has been very sensitive to Lasix  in past).     Elevated troponin: He has history of large anterior MI in 2012 treated with DES to LAD.  This admission, suspect type II NSTEMI in the setting of hypoxic respiratory failure/significant volume overload. He has had no chest pain.  - No current plan to cath given lack of anginal equivalent   Acute on chronic combined systolic and diastolic heart failure - Due to ICM with anterior MI in 2012 - Echo 02/27/23: EF <20%, LV with GHK, RV mildly reduced, GIIDD, LA mod dilated, mild MR.  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 GDMT limited by renal function and hypotension.  - S/P HM3 LVAD implant 03/08/23 - Volume up on CXR and exam, diuresing well w/ IV Lasix . C/w volume overload. Received IV Lasix  x 2 days with good diuresis.  - Ramp echo, speed increased to 5600 rpm with midline septum at this speed.  The aortic valve did not open, there was mild AI.  The RV was normal in size and mildly dysfunctional.  Flow increased significantly with increase in speed.  - Start Lasix  20 mg every other day for home (can start tomorrow).  Will need to watch carefully for orthostasis.  - Continue warfarin, goal 2-2.5, INR 2.1, reviewed with pharmacy    CKD stage 3: Improved -  cardiorenal, resolved w/ LVAD support - Scr stable at 0.94.    H/o VT s/p Medtronic ICD: likely scar mediated - No recent VT - off amio   Paroxysmal Atrial fibrillation:  - Remains in NSR. On warfarin  - INR management as above   LE DVT:  - Warfarin as above  Hypokalemia - give K supp   OK for home today.  Only new medication will be Lasix  20 mg every other day starting tomorrow, no changes otherwise.    I reviewed the LVAD parameters from today, and compared the results to the patient's prior recorded data.  No programming changes were made.  The LVAD is functioning within specified parameters.  The patient performs LVAD self-test daily.  LVAD interrogation was negative for any significant power changes, alarms or PI events/speed drops.  LVAD equipment check completed and is in good working order.  Back-up equipment present.   LVAD education done on emergency procedures and precautions and reviewed exit site care.  Length of Stay: 2  Ezra Shuck, MD 04/08/2024, 11:20 AM  VAD Team --- VAD ISSUES ONLY--- Pager [phone] (7am - 7am)  Advanced Heart Failure Team  Pager [phone] (M-F; 7a -  5p)  Please contact [facility] for night-coverage after hours (5p -7a ) and weekends on [email or web address]

## 2024-04-08 NOTE — TOC Transition Note (Addendum)
 Transition of Care [facility]) - Discharge Note   Patient Details  Name: Steven [name] MRN: [id number] Date of Birth: 1955-07-31  Transition of Care [facility]) CM/SW Contact:  Justina Delcia Czar, RN Phone Number: [phone] 04/08/2024, 3:05 PM   Clinical Narrative:    Spoke to pt at bedside. Pt states he has scale at home for daily weights. Pt has Living Better with HF booklet at home. Pt is requesting a note for work. Attending updated. Gave note to pt. Educated pt on benefits of wearing a mask.   Scheduled hospital follow up appt with PCP on 04/15/2024 at 150 pm. Family will provide transportation home.    Final next level of care: Home/Self Care Barriers to Discharge: No Barriers Identified   Patient Goals and CMS Choice            Discharge Placement                       Discharge Plan and Services Additional resources added to the After Visit Summary for     Discharge Planning Services: CM Consult                                 Social Drivers of Health (SDOH) Interventions SDOH Screenings   Food Insecurity: No Food Insecurity (04/06/2024)  Housing: High Risk (04/06/2024)  Transportation Needs: No Transportation Needs (04/06/2024)  Utilities: Not At Risk (04/06/2024)  Alcohol Screen: Low Risk  (05/08/2022)  Depression (PHQ2-9): Low Risk  (01/24/2024)  Financial Resource Strain: Medium Risk (06/26/2023)  Social Connections: Socially Isolated (04/07/2024)  Tobacco Use: Medium Risk (04/07/2024)  Health Literacy: Adequate Health Literacy (07/25/2023)     Readmission Risk Interventions     No data to display

## 2024-04-08 NOTE — Plan of Care (Signed)
  Problem: Education: Goal: Patient will understand all VAD equipment and how it functions Outcome: Adequate for Discharge Goal: Patient will be able to verbalize current INR target range and antiplatelet therapy for discharge home Outcome: Adequate for Discharge   Problem: Cardiac: Goal: LVAD will function as expected and patient will experience no clinical alarms Outcome: Adequate for Discharge   Problem: Education: Goal: Knowledge of General Education information will improve Description: Including pain rating scale, medication(s)/side effects and non-pharmacologic comfort measures Outcome: Adequate for Discharge   Problem: Health Behavior/Discharge Planning: Goal: Ability to manage health-related needs will improve Outcome: Adequate for Discharge   Problem: Clinical Measurements: Goal: Ability to maintain clinical measurements within normal limits will improve Outcome: Adequate for Discharge Goal: Will remain free from infection Outcome: Adequate for Discharge Goal: Diagnostic test results will improve Outcome: Adequate for Discharge Goal: Respiratory complications will improve Outcome: Adequate for Discharge Goal: Cardiovascular complication will be avoided Outcome: Adequate for Discharge   Problem: Activity: Goal: Risk for activity intolerance will decrease Outcome: Adequate for Discharge   Problem: Nutrition: Goal: Adequate nutrition will be maintained Outcome: Adequate for Discharge   Problem: Coping: Goal: Level of anxiety will decrease Outcome: Adequate for Discharge   Problem: Elimination: Goal: Will not experience complications related to bowel motility Outcome: Adequate for Discharge Goal: Will not experience complications related to urinary retention Outcome: Adequate for Discharge   Problem: Pain Managment: Goal: General experience of comfort will improve and/or be controlled Outcome: Adequate for Discharge   Problem: Safety: Goal: Ability to  remain free from injury will improve Outcome: Adequate for Discharge   Problem: Skin Integrity: Goal: Risk for impaired skin integrity will decrease Outcome: Adequate for Discharge

## 2024-04-09 ENCOUNTER — Other Ambulatory Visit (HOSPITAL_COMMUNITY): Payer: Self-pay | Admitting: *Deleted

## 2024-04-09 DIAGNOSIS — Z7901 Long term (current) use of anticoagulants: Secondary | ICD-10-CM

## 2024-04-09 DIAGNOSIS — I5042 Chronic combined systolic (congestive) and diastolic (congestive) heart failure: Secondary | ICD-10-CM

## 2024-04-09 DIAGNOSIS — Z95811 Presence of heart assist device: Secondary | ICD-10-CM

## 2024-04-10 ENCOUNTER — Other Ambulatory Visit: Payer: Self-pay

## 2024-04-10 ENCOUNTER — Ambulatory Visit (HOSPITAL_COMMUNITY): Payer: Self-pay | Admitting: Pharmacist

## 2024-04-10 ENCOUNTER — Other Ambulatory Visit (HOSPITAL_COMMUNITY): Payer: Self-pay

## 2024-04-10 ENCOUNTER — Telehealth (HOSPITAL_COMMUNITY): Payer: Self-pay

## 2024-04-10 ENCOUNTER — Ambulatory Visit (HOSPITAL_COMMUNITY)
Admission: RE | Admit: 2024-04-10 | Discharge: 2024-04-10 | Disposition: A | Discharge status: Home / Self Care | Source: Ambulatory Visit | Attending: Internal Medicine | Admitting: Internal Medicine

## 2024-04-10 VITALS — BP 94/0 | HR 85 | Ht 70.0 in | Wt 152.8 lb

## 2024-04-10 DIAGNOSIS — I480 Paroxysmal atrial fibrillation: Secondary | ICD-10-CM

## 2024-04-10 DIAGNOSIS — I513 Intracardiac thrombosis, not elsewhere classified: Secondary | ICD-10-CM

## 2024-04-10 DIAGNOSIS — I5042 Chronic combined systolic (congestive) and diastolic (congestive) heart failure: Secondary | ICD-10-CM | POA: Diagnosis present

## 2024-04-10 DIAGNOSIS — Z7901 Long term (current) use of anticoagulants: Secondary | ICD-10-CM

## 2024-04-10 DIAGNOSIS — Z95811 Presence of heart assist device: Secondary | ICD-10-CM

## 2024-04-10 DIAGNOSIS — I2510 Atherosclerotic heart disease of native coronary artery without angina pectoris: Secondary | ICD-10-CM | POA: Diagnosis not present

## 2024-04-10 DIAGNOSIS — I5022 Chronic systolic (congestive) heart failure: Secondary | ICD-10-CM

## 2024-04-10 LAB — BASIC METABOLIC PANEL WITH GFR
Anion gap: 10 (ref 5–15)
BUN: 13 mg/dL (ref 8–23)
CO2: 23 mmol/L (ref 22–32)
Calcium: 8.4 mg/dL — ABNORMAL LOW (ref 8.9–10.3)
Chloride: 103 mmol/L (ref 98–111)
Creatinine, Ser: 1.11 mg/dL (ref 0.61–1.24)
GFR, Estimated: >60 mL/min (ref >60)
Glucose, Bld: 113 mg/dL — ABNORMAL HIGH (ref 70–99)
Potassium: 4.1 mmol/L (ref 3.5–5.1)
Sodium: 136 mmol/L (ref 135–145)

## 2024-04-10 LAB — CBC
HCT: 32.4 % — ABNORMAL LOW (ref 39.0–52.0)
Hemoglobin: 10.4 g/dL — ABNORMAL LOW (ref 13.0–17.0)
MCH: 30.9 pg (ref 26.0–34.0)
MCHC: 32.1 g/dL (ref 30.0–36.0)
MCV: 96.1 fL (ref 80.0–100.0)
Platelets: 241 K/uL (ref 150–400)
RBC: 3.37 MIL/uL — ABNORMAL LOW (ref 4.22–5.81)
RDW: 16.7 % — ABNORMAL HIGH (ref 11.5–15.5)
WBC: 9.5 K/uL (ref 4.0–10.5)
nRBC: 0 % (ref 0.0–0.2)

## 2024-04-10 LAB — LACTATE DEHYDROGENASE: LDH: 234 U/L — ABNORMAL HIGH (ref 98–192)

## 2024-04-10 LAB — PROTIME-INR
INR: 2 — ABNORMAL HIGH (ref 0.8–1.2)
Prothrombin Time: 23.2 s — ABNORMAL HIGH (ref 11.4–15.2)

## 2024-04-10 MED ORDER — EMPAGLIFLOZIN 10 MG PO TABS
10.0000 mg | ORAL_TABLET | Freq: Every day | ORAL | 3 refills | Status: DC
  Filled 2024-04-10: qty 30, 30d supply, fill #0
  Filled 2024-05-05: qty 30, 30d supply, fill #1

## 2024-04-10 NOTE — Progress Notes (Signed)
 ReDS Vest / Clip - 04/10/24 1000       ReDS Vest / Clip   Station Marker C    Ruler Value 27    ReDS Value Range Low volume    ReDS Actual Value 31

## 2024-04-10 NOTE — Patient Instructions (Signed)
 Start Jardiance 10 mg daily Return to clinic in 1 week

## 2024-04-10 NOTE — Progress Notes (Signed)
 Patient presents for hosp follow up in VAD Clinic today. Reports no problems with VAD equipment or concerns with drive line.   Pt with recent admission for CHF exacerbation vs. Pneumonia. Pt was given Augmentin  x 2 days for home. He will finish this today. Pt tells me that he is taking the Lasix  every other day as instructed. He denies any SOB, or signs of heart failure. Denies lightheadedness, dizziness, falls or signs of bleeding.   REDs vest: 31  Vital Signs:   Doppler BP: 94 Automatic BP: 96/78 (86) HR: 85 SPO2: 100%   Weight: 152.8 lb w/ eqt Last weight: 146 lb w/ eqt  VAD Indication: Destination Therapy - pt preference, social issues   VAD interrogation & Equipment Management: Speed: 5600 Flow: 4.6 Power: 4.2 w PI: 3.6   Alarms: none Events: none  Fixed speed: 5600 Low speed limit:  5300   Primary Controller:  Replace back up battery in 20 months. Back up controller:  Not present  Annual Equipment Maintenance on UBC/PM was performed on 03/25/24.    I reviewed the LVAD parameters from today and compared the results to the patient's prior recorded data. LVAD interrogation was NEGATIVE for significant power changes, NEGATIVE for clinical alarms and STABLE for PI events/speed drops. No programming changes were made and pump is functioning within specified parameters. Pt is performing daily controller and system monitor self tests along with completing weekly and monthly maintenance for LVAD equipment.   LVAD equipment check completed and is in good working order. Back-up equipment NOT present. Charged back up battery and performed self-test on equipment.    Exit Site Care:  CDI.     Significant Events on VAD Support:   Device: Medtronic Therapies: on VF 200 BPM Last check: 11/20/23  BP & Labs:  MAP 94 - Doppler is reflecting MAP   Hgb 10.4 - No S/S of bleeding. Specifically denies melena/BRBPR or nosebleeds.   LDH stable at 224 with established baseline of  230-290. Denies tea-colored urine. No power elevations noted on interrogation.   Plan: Start Jardiance 10 mg daily Return to clinic in 1 week  Lauraine Ip RN, BSN VAD Coordinator 24/7 Pager [phone]

## 2024-04-10 NOTE — Telephone Encounter (Signed)
 Advanced Heart Failure Patient Advocate Encounter  Test billing for this patient's current coverage ([employer]) returns a $0 copay for 90 day supply of Jardiance.  This test claim was processed through [facility]- copay amounts may vary at other pharmacies due to pharmacy/plan contracts, or as the patient moves through the different stages of their insurance plan.  Rachel DEL, CPhT Rx Patient Advocate Phone: [phone]

## 2024-04-12 LAB — CULTURE, BLOOD (ROUTINE X 2)
Culture: NO GROWTH
Culture: NO GROWTH

## 2024-04-14 ENCOUNTER — Other Ambulatory Visit (HOSPITAL_COMMUNITY): Payer: Self-pay

## 2024-04-14 ENCOUNTER — Encounter (HOSPITAL_COMMUNITY): Payer: Self-pay | Admitting: Cardiology

## 2024-04-14 DIAGNOSIS — Z95811 Presence of heart assist device: Secondary | ICD-10-CM

## 2024-04-14 DIAGNOSIS — Z7901 Long term (current) use of anticoagulants: Secondary | ICD-10-CM

## 2024-04-15 ENCOUNTER — Encounter: Payer: Self-pay | Admitting: Family Medicine

## 2024-04-15 ENCOUNTER — Other Ambulatory Visit (HOSPITAL_COMMUNITY): Payer: Self-pay

## 2024-04-15 ENCOUNTER — Ambulatory Visit (HOSPITAL_COMMUNITY): Payer: Self-pay | Admitting: Pharmacist

## 2024-04-15 ENCOUNTER — Ambulatory Visit (INDEPENDENT_AMBULATORY_CARE_PROVIDER_SITE_OTHER): Admitting: Family Medicine

## 2024-04-15 ENCOUNTER — Encounter: Payer: Self-pay | Admitting: Pharmacist

## 2024-04-15 ENCOUNTER — Ambulatory Visit (HOSPITAL_COMMUNITY)
Admission: RE | Admit: 2024-04-15 | Discharge: 2024-04-15 | Disposition: A | Discharge status: Home / Self Care | Source: Ambulatory Visit | Attending: Cardiology | Admitting: Cardiology

## 2024-04-15 ENCOUNTER — Other Ambulatory Visit: Payer: Self-pay

## 2024-04-15 VITALS — HR 51 | Temp 97.6°F | Resp 18 | Ht 70.0 in | Wt 155.0 lb

## 2024-04-15 DIAGNOSIS — E785 Hyperlipidemia, unspecified: Secondary | ICD-10-CM | POA: Diagnosis not present

## 2024-04-15 DIAGNOSIS — N1831 Chronic kidney disease, stage 3a: Secondary | ICD-10-CM | POA: Insufficient documentation

## 2024-04-15 DIAGNOSIS — Z7901 Long term (current) use of anticoagulants: Secondary | ICD-10-CM | POA: Insufficient documentation

## 2024-04-15 DIAGNOSIS — Z7984 Long term (current) use of oral hypoglycemic drugs: Secondary | ICD-10-CM | POA: Insufficient documentation

## 2024-04-15 DIAGNOSIS — I5084 End stage heart failure: Secondary | ICD-10-CM | POA: Insufficient documentation

## 2024-04-15 DIAGNOSIS — I255 Ischemic cardiomyopathy: Secondary | ICD-10-CM | POA: Insufficient documentation

## 2024-04-15 DIAGNOSIS — I252 Old myocardial infarction: Secondary | ICD-10-CM | POA: Insufficient documentation

## 2024-04-15 DIAGNOSIS — I513 Intracardiac thrombosis, not elsewhere classified: Secondary | ICD-10-CM

## 2024-04-15 DIAGNOSIS — Z95811 Presence of heart assist device: Secondary | ICD-10-CM | POA: Insufficient documentation

## 2024-04-15 DIAGNOSIS — I480 Paroxysmal atrial fibrillation: Secondary | ICD-10-CM | POA: Insufficient documentation

## 2024-04-15 DIAGNOSIS — Z4509 Encounter for adjustment and management of other cardiac device: Secondary | ICD-10-CM | POA: Insufficient documentation

## 2024-04-15 DIAGNOSIS — Z23 Encounter for immunization: Secondary | ICD-10-CM

## 2024-04-15 DIAGNOSIS — I5042 Chronic combined systolic (congestive) and diastolic (congestive) heart failure: Secondary | ICD-10-CM | POA: Insufficient documentation

## 2024-04-15 DIAGNOSIS — Z95810 Presence of automatic (implantable) cardiac defibrillator: Secondary | ICD-10-CM | POA: Insufficient documentation

## 2024-04-15 DIAGNOSIS — I2510 Atherosclerotic heart disease of native coronary artery without angina pectoris: Secondary | ICD-10-CM | POA: Insufficient documentation

## 2024-04-15 LAB — CBC
HCT: 33.2 % — ABNORMAL LOW (ref 39.0–52.0)
Hemoglobin: 10.5 g/dL — ABNORMAL LOW (ref 13.0–17.0)
MCH: 30 pg (ref 26.0–34.0)
MCHC: 31.6 g/dL (ref 30.0–36.0)
MCV: 94.9 fL (ref 80.0–100.0)
Platelets: 275 K/uL (ref 150–400)
RBC: 3.5 MIL/uL — ABNORMAL LOW (ref 4.22–5.81)
RDW: 15.6 % — ABNORMAL HIGH (ref 11.5–15.5)
WBC: 8.4 K/uL (ref 4.0–10.5)
nRBC: 0 % (ref 0.0–0.2)

## 2024-04-15 LAB — BASIC METABOLIC PANEL WITH GFR
Anion gap: 6 (ref 5–15)
BUN: 13 mg/dL (ref 8–23)
CO2: 24 mmol/L (ref 22–32)
Calcium: 8.4 mg/dL — ABNORMAL LOW (ref 8.9–10.3)
Chloride: 107 mmol/L (ref 98–111)
Creatinine, Ser: 1.03 mg/dL (ref 0.61–1.24)
GFR, Estimated: >60 mL/min (ref >60)
Glucose, Bld: 97 mg/dL (ref 70–99)
Potassium: 3.8 mmol/L (ref 3.5–5.1)
Sodium: 137 mmol/L (ref 135–145)

## 2024-04-15 LAB — LACTATE DEHYDROGENASE: LDH: 206 U/L — ABNORMAL HIGH (ref 98–192)

## 2024-04-15 LAB — PROTIME-INR
INR: 1.9 — ABNORMAL HIGH (ref 0.8–1.2)
Prothrombin Time: 22.3 s — ABNORMAL HIGH (ref 11.4–15.2)

## 2024-04-15 MED ORDER — LOSARTAN POTASSIUM 25 MG PO TABS
12.5000 mg | ORAL_TABLET | Freq: Every day | ORAL | 3 refills | Status: DC
  Filled 2024-04-15: qty 45, 90d supply, fill #0

## 2024-04-15 NOTE — Progress Notes (Signed)
 Patient presents for 1 week follow up in VAD Clinic today. Reports no problems with VAD equipment or concerns with drive line.   Pt states he is feeling much better since discharge. Continues working daily. Denies lightheadedness, dizziness, falls, shortness of breath or signs of bleeding. Pt started on Jardiance 10mg  last week and continues Lasix  20mg  EOD. Pt hypertensive today will restart Losartan  12.5mg  daily. Pt given pill cutter at today's visit. VAD Coordinator's will check BP next week with dressing change.   Vital Signs:   Doppler BP: 104 Automatic BP: 112/94 (101) HR: 74 SPO2: 100%   Weight: 153.4 lb w/ eqt Last weight: 152.8 lb w/ eqt  VAD Indication: Destination Therapy - pt preference, social issues   VAD interrogation & Equipment Management: Speed: 5600 Flow: 4.6 Power: 4.3 w PI: 3.6   Alarms: none Events: 40+  Fixed speed: 5600 Low speed limit:  5300   Primary Controller:  Replace back up battery in 20 months. Back up controller:  Not present  Annual Equipment Maintenance on UBC/PM was performed on 03/25/24.    I reviewed the LVAD parameters from today and compared the results to the patient's prior recorded data. LVAD interrogation was NEGATIVE for significant power changes, NEGATIVE for clinical alarms and STABLE for PI events/speed drops. No programming changes were made and pump is functioning within specified parameters. Pt is performing daily controller and system monitor self tests along with completing weekly and monthly maintenance for LVAD equipment.   LVAD equipment check completed and is in good working order. Back-up equipment NOT present. Pt asked to bring back up equipment with him next visit.   Exit Site Care: Existing VAD dressing removed and site care performed using sterile technique. Drive line exit site cleaned with Chlora prep applicators x 2, allowed to dry, and Sorbaview dressing with Silverlon patch applied. Exit site healed and  incorporated, the velour is fully implanted at exit site. No redness, tenderness, drainage, foul odor or rash noted. Drive line anchor re-applied. Pt denies fever or chills.    Significant Events on VAD Support:   Device: Medtronic Therapies: on VF 200 BPM Last check: 11/20/23  BP & Labs:  MAP 104 - Doppler is reflecting MAP   Hgb 10.5 - No S/S of bleeding. Specifically denies melena/BRBPR or nosebleeds.   LDH stable at 206 with established baseline of 230-290. Denies tea-colored urine. No power elevations noted on interrogation.   Plan: Start Losartan  12.5 mg daily  Return to clinic in 1 week for dressing change Return 2 months for follow up with Dr. Cherrie Schuyler Lunger RN, BSN VAD Coordinator 24/7 Pager [phone]

## 2024-04-15 NOTE — Progress Notes (Signed)
 LVAD CLINIC NOTE  PCP: Ziglar, Susan K, MD HF doc: DB  Chief complaint: HF  HPI:  Steven [name] is a 68 y.o. 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 Underwent HM-3 VAD placement 0n 03/08/23   Originally from [place], suffered a large out of hospital anterior wall myocardial infarction January 2012.  He presented after a three-hour delay to the hospital and was taken emergently for cath with DES to LAD. EF was 35-40% at time of MI. Unfortunately, did not have recovery of EF. C/w progressive decline in systolic function over the years. Ultimately underwent ICD.    Admitted on 02/26/23 w/ NYHA Class IV symptoms and low output. Echo showed EF < 20%, RV mildly reduced. RHC showed with low output. CI 2.1. Calcified pericardium (no H/o TB). Underwent HMIII LVAD implant on 03/08/23. Intra-op TEE LVEF 15%, RV normal.  Post op course was c/b afib w/ RVR, requiring IV amiodarone . Converted back to NSR.  Seen on 05/22/23 for unscheduled f/u due to low flow alarms on VAD. Pacer reprogrammed to let intrinsic rate come through (was RV pacing at 80). Feels much better. Walking 40 mins every day in the park. Denies orthopnea or PND. No fevers, chills or problems with driveline. No bleeding, melena or neuro symptoms. No VAD alarms. Taking all meds as prescribed.   Admitted 01/2024 after asymptomatic low flow alarm noted while in bed. Seen in LVAD clinic at which point it time he was orthostatic, dizzy, and hemoglobin came back at 8.4. Was admitted for GI workup which was largely negative. Remained dry during hospital visit, received multiple fluid boluses.   02/2024 was still orthostatic requiring IV fluid bolus, and his speed was reduced to 5400 given frequent suck down events. He underwent imaging that showed a suspicious chest wall mass near his thoracic duct.  Underwent PET scan that  showed no hypermetabolic uptake.    Admitted 03/2024 with acute hypoxic respiratory failure.  Workup and imaging consistent with volume overload, speed was increased and patient was started on low-dose diuretics.   Since that visit, patient reports that he is feeling significantly improved.  He has been taking diuretics every other day and was recently started on Jardiance.  Denies any lightheadedness, dizziness, significant VAD alarms. He denies any melena, BRBPR, appetite or swallowing issues.  Blood pressure elevated in clinic today.  LVAD Documentation    04/15/2024  Device Info  LVAD Type: Heartmate III  Date of Implant: 03/08/2023  Therapy Type: Destination Therapy      04/15/2024  Vitals  Heart Rate: 74 BPM  Automatic BP: 112/94  Doppler MAP: 104 mmHg  SpO2: 100 %    Last 3 Weights Weight Weight  04/15/2024 70.308 kg 155 lb  04/10/2024 69.31 kg 152 lb 12.8 oz  04/08/2024 66.225 kg 146 lb       04/15/2024  LVAD Paramaters  Speed: 5600 RPM  Flow: 5 LPM  PI: 4  Power: 4 Watts  Hematocrit: 20 %  Alarms: none  Events: 40+  Last Speed Change Date: 04/08/2024  Last Ramp Echo Date: 04/08/2024  Last Right Heart Cath Date: 03/06/2023  Bleeding History: No  Type of Dressing: Weekly  Annual Maintenance Date: 03/25/2024    Labs    Units 04/15/24 0910 04/10/24 0955 04/08/24 0638  INR  1.9* 2.0* 2.1*  LDH U/L 206* 234* 219*  HGB  g/dL 89.4* 89.5* 9.8*  CREATININE mg/dL 8.96 8.88 9.05           VAD Indication: Destination Therapy - pt preference, social issues   Past Medical History:  Diagnosis Date   Acute MI, anterior wall (HCC)    AICD (automatic cardioverter/defibrillator) present    CAD (coronary artery disease)    2D ECHO, 07/13/2011 - EF <25%, LV moderatelty dilated, LA moderately dilatedLEXISCAN, 12/14/2011 - moderate-severe perfusion defect seen in the basal anteroseptal, mid anterior, apicacl anterior, apical, apical inferior, and apical lateral regions,  post-stress EF 25%, new EKG changes from baseline abnormalities   Cancer (HCC)    Prostate   CHF (congestive heart failure) (HCC) 2012   Hypertension 08/08/2021   Inguinal hernia, left    Pneumonia    November 2023   Pre-diabetes     Current Outpatient Medications  Medication Sig Dispense Refill   acetaminophen  (TYLENOL ) 500 MG tablet Take 1,000 mg by mouth daily as needed for mild pain (pain score 1-3) or moderate pain (pain score 4-6).     atorvastatin  (LIPITOR ) 80 MG tablet Take 1 tablet (80 mg total) by mouth daily. 90 tablet 3   empagliflozin (JARDIANCE) 10 MG TABS tablet Take 1 tablet (10 mg total) by mouth daily before breakfast. 30 tablet 3   ferrous sulfate  325 (65 FE) MG tablet Take 1 tablet (325 mg total) by mouth daily with breakfast. 30 tablet 3   finasteride  (PROSCAR ) 5 MG tablet Take 1 tablet (5 mg total) by mouth daily. 90 tablet 3   furosemide  (LASIX ) 20 MG tablet Take 1 tablet (20 mg total) by mouth every other day. 20 tablet 3   losartan  (COZAAR ) 25 MG tablet Take 0.5 tablets (12.5 mg total) by mouth daily. 90 tablet 3   Multiple Vitamins-Minerals (CERTAVITE/ANTIOXIDANTS) TABS Take 1 tablet by mouth daily. 130 tablet 0   pantoprazole  (PROTONIX ) 40 MG tablet Take 1 tablet (40 mg total) by mouth daily. 30 tablet 6   tamsulosin  (FLOMAX ) 0.4 MG CAPS capsule Take 1 capsule (0.4 mg total) by mouth daily. 90 capsule 3   traZODone  (DESYREL ) 50 MG tablet Take 1 tablet (50 mg total) by mouth at bedtime as needed for sleep. 30 tablet 3   warfarin (COUMADIN ) 2.5 MG tablet Take 2.5 mg (1 tablet) every Mon,Wed, Fri; 5 mg (2 tablets) all other days; or as directed by heart failure clinic 60 tablet 6   amoxicillin -clavulanate (AUGMENTIN ) 875-125 MG tablet Take 1 tablet by mouth every 12 (twelve) hours. (Patient not taking: Reported on 04/15/2024) 4 tablet 0   docusate sodium  (COLACE) 100 MG capsule Take 2 capsules (200 mg total) by mouth daily. (Patient not taking: Reported on  04/15/2024) 100 capsule 0   No current facility-administered medications for this encounter.    Patient has no known allergies.  REVIEW OF SYSTEMS: All systems negative except as listed in HPI, PMH and Problem list.   Physical Exam: General:  Well appearing. No resp difficulty Cor: Mechanical heart sounds with LVAD hum present. JVP flat, No edema Lungs: Normal WOB Abdomen: soft, nontender, nondistended.  Driveline: C/D/I; securement device intact and driveline incorporated Neuro: alert & orientedx3, cranial nerves grossly intact. moves all 4 extremities w/o difficulty.      ASSESSMENT AND PLAN:  Chronic combined systolic and diastolic heart failure - Due to ICM with anterior MI in 2012 - Echo 02/27/23: EF <20%, LV with GHK, RV mildly reduced, GIIDD, LA mod dilated, mild MR.  End-stage HF. GDMT limited  by renal function and hypotension.  - S/P HM3 LVAD implant 03/08/23 - NYHA class II, recent admit for volume overload, suspect induced by speed changes and medication adjustments made during prior GI illness.  - BP elevated today, start low dose losartan  12.5mg  daily, BP recheck at follow up - Continue jardiance 10mg  daily - Continue lasix  40mg  every other day, hold for dizzy or lightheadedness  VAD  - RAMP echo 3/25 speed turned down from 5700 -> 5600 - Speed further decreased on 8/28 to 5400 for frequent suck downs - After appetite improved, speed increased back to 5600RPM during 03/2024 admission - LDH and Hgb noted above, near baseline - DL ok - May be able to slowly increase speed at next visit - INR 1.9, goal 2-3, discussed with pharmacy  CAD:  - h/o large anterior MI 2012 treated w/ DES to LAD.  - No s/s angina - off asa as above.  - Continue statin   CKD stage 3: Improved - cardiorenal, resolved w/ LVAD support - Scr stable at 1.09   H/o VT s/p Medtronic ICD: likely scar mediated - No recent VT - off amio  Paroxysmal Atrial fibrillation:  - Remains in NSR. On  warfarin  - INR management as above  I spent 46 minutes caring for this patient today including face to face time, ordering and reviewing labs, reviewing records from recent hospitalization, echo RAMP images, seeing the patient, documenting in the record, and arranging follow ups.    Morene JINNY Brownie, MD  3:01 PM

## 2024-04-15 NOTE — Progress Notes (Unsigned)
 Established Patient Office Visit  Subjective   Patient ID: Steven [name], male    DOB: 27-Sep-1955  Age: 68 y.o. MRN: [id number]  Chief Complaint  Patient presents with  . Hospitalization Follow-up    HPI 68-yo gentleman who has he has chronic combined systolic and diastolic heart LV failure due to ischemic cardiomyopathy, CAD, V. 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  Underwent placement of HM-3 VAD on 03/08/2023.  (History of large anterior MI January 2012.  Had DES to LAD.  EF was 35 to 40% and there was no recovery of his EF.  He had a slow decline in his systolic function over the years and eventually underwent ICD.  He was admitted 9/24 with NYHA class IV symptoms.  He blacked out on three occasions but did not hit his head.  He was short of breath with ambulation and had fluid overload from CHF.  He had HM 3 LVAD implant 03/08/2023.  TEE LVEF 15%.  RV normal.  He had problems with atrial fibrillation with RVR requiring IV amiodarone  following implantation.)  Discussed the use of AI scribe software for clinical note transcription with the patient, who gave verbal consent to proceed.  History of Present Illness      Steven [name] was admitted to the hospital 8/25 for symptomatic low flow and his hemoglobin was found to be low.  He was profoundly orthostatic and needed multiple fluid boluses.  He had a colonoscopy and endoscopy.  He had PET imaging of his brain, chest ,abdomen and pelvis and his malignancy workup was negative.  Steven [name] had been feeling poorly for 3 months and was admitted to the hospital 10/12 through 10/14 for fluid overload and shortness of breath.  He was also treated for CAP.  He was given azithromycin  and ceftriaxone .  He was to complete a course of antibiotics with p.o. Augmentin  at discharge.  Respiratory panel was negative, blood cultures were obtained and negative. PE was also ruled  out with a negative chest CT.  He was diuresed with IV Lasix  and had an significant improvement in his symptoms.  He was discharged on Lasix  20 mg every other day.  Losartan  12.5 mg daily and Jardiance.  (His Entresto  has been stopped) His LVAD speed was increased to 5700.  He has been getting iron infusions from the hematologist.    He received a flu shot while in the hospital.  He had a Prevnar 23 given 3 years ago but has not had a pneumonia vaccine since.         Objective:     Pulse (!) 51   Temp 97.6 F (36.4 C) (Oral)   Resp 18   Ht 5' 10 (1.778 m)   Wt 155 lb (70.3 kg)   SpO2 98%   BMI 22.24 kg/m  {Vitals History (Optional):23777}  Physical Exam Vitals and nursing note reviewed.  Constitutional:      Appearance: Normal appearance.  HENT:     Head: Normocephalic and atraumatic.  Eyes:     Conjunctiva/sclera: Conjunctivae normal.  Cardiovascular:     Rate and Rhythm: Normal rate and regular rhythm.     Heart sounds: Murmur (hum of LVAD) heard.  Pulmonary:     Effort: Pulmonary effort is normal.     Breath sounds: Normal breath sounds.  Musculoskeletal:     Right lower leg: No edema.     Left lower  leg: No edema.  Skin:    General: Skin is warm and dry.  Neurological:     Mental Status: He is alert and oriented to person, place, and time.  Psychiatric:        Mood and Affect: Mood normal.        Behavior: Behavior normal.        Thought Content: Thought content normal.        Judgment: Judgment normal.      {Perform Simple Foot Exam  Perform Detailed exam:1} {Insert foot Exam (Optional):30965}   Results for orders placed or performed during the hospital encounter of 04/15/24  Protime-INR  Result Value Ref Range   Prothrombin Time 22.3 (H) 11.4 - 15.2 seconds   INR 1.9 (H) 0.8 - 1.2  Lactate dehydrogenase  Result Value Ref Range   LDH 206 (H) 98 - 192 U/L  CBC  Result Value Ref Range   WBC 8.4 4.0 - 10.5 K/uL   RBC 3.50 (L) 4.22 - 5.81 MIL/uL    Hemoglobin 10.5 (L) 13.0 - 17.0 g/dL   HCT 66.7 (L) 60.9 - 47.9 %   MCV 94.9 80.0 - 100.0 fL   MCH 30.0 26.0 - 34.0 pg   MCHC 31.6 30.0 - 36.0 g/dL   RDW 84.3 (H) 88.4 - 84.4 %   Platelets 275 150 - 400 K/uL   nRBC 0.0 0.0 - 0.2 %  Basic metabolic panel with GFR  Result Value Ref Range   Sodium 137 135 - 145 mmol/L   Potassium 3.8 3.5 - 5.1 mmol/L   Chloride 107 98 - 111 mmol/L   CO2 24 22 - 32 mmol/L   Glucose, Bld 97 70 - 99 mg/dL   BUN 13 8 - 23 mg/dL   Creatinine, Ser 8.96 0.61 - 1.24 mg/dL   Calcium  8.4 (L) 8.9 - 10.3 mg/dL   GFR, Estimated >39 >39 mL/min   Anion gap 6 5 - 15    {Labs (Optional):23779}  The ASCVD Risk score (Arnett DK, et al., 2019) failed to calculate for the following reasons:   Risk score cannot be calculated because patient has a medical history suggesting prior/existing ASCVD    Assessment & Plan:  Immunization due -     Pneumococcal conjugate vaccine 20-valent     Return in about 3 months (around 07/16/2024).    Vernida Mcnicholas K Revella Shelton, MD

## 2024-04-15 NOTE — Patient Instructions (Signed)
 Start Losartan  12.5mg  daily  Return in 1 week for dressing change Return in 2 months for f/u with Dr.Bensimhon

## 2024-04-17 ENCOUNTER — Other Ambulatory Visit: Payer: Self-pay

## 2024-04-18 ENCOUNTER — Other Ambulatory Visit (HOSPITAL_COMMUNITY): Payer: Self-pay | Admitting: *Deleted

## 2024-04-18 DIAGNOSIS — Z95811 Presence of heart assist device: Secondary | ICD-10-CM

## 2024-04-18 DIAGNOSIS — Z7901 Long term (current) use of anticoagulants: Secondary | ICD-10-CM

## 2024-04-22 ENCOUNTER — Ambulatory Visit (HOSPITAL_COMMUNITY): Payer: Self-pay | Admitting: Pharmacist

## 2024-04-22 ENCOUNTER — Ambulatory Visit (HOSPITAL_COMMUNITY)
Admission: RE | Admit: 2024-04-22 | Discharge: 2024-04-22 | Disposition: A | Discharge status: Home / Self Care | Source: Ambulatory Visit | Attending: Internal Medicine | Admitting: Internal Medicine

## 2024-04-22 DIAGNOSIS — Z7901 Long term (current) use of anticoagulants: Secondary | ICD-10-CM | POA: Diagnosis not present

## 2024-04-22 DIAGNOSIS — Z4801 Encounter for change or removal of surgical wound dressing: Secondary | ICD-10-CM | POA: Diagnosis present

## 2024-04-22 DIAGNOSIS — Z95811 Presence of heart assist device: Secondary | ICD-10-CM | POA: Insufficient documentation

## 2024-04-22 DIAGNOSIS — I513 Intracardiac thrombosis, not elsewhere classified: Secondary | ICD-10-CM

## 2024-04-22 LAB — PROTIME-INR
INR: 1.5 — ABNORMAL HIGH (ref 0.8–1.2)
Prothrombin Time: 18.4 s — ABNORMAL HIGH (ref 11.4–15.2)

## 2024-04-22 NOTE — Progress Notes (Signed)
 Patient presents for dressing change, BP check and INR in VAD Clinic today. Reports no problems with VAD equipment or concerns with drive line.   Pt states that he started Losartan  12.5mg  daily as instructed last week.   Automatic BP: 104/88 (95)     Exit Site Care: Existing VAD dressing removed and site care performed using sterile technique. Drive line exit site cleaned with Chlora prep applicators x 2, allowed to dry, and Sorbaview dressing with Silverlon patch applied. Exit site healed and incorporated, the velour is fully implanted at exit site. No redness, tenderness, drainage, foul odor or rash noted. Drive line anchor re-applied. Pt denies fever or chills.    Plan: Return to clinic in 1 week for dressing change only   Lauraine Ip RN, BSN VAD Coordinator 24/7 Pager [phone]

## 2024-04-22 NOTE — Addendum Note (Signed)
 Encounter addended by: Elza Lauraine NOVAK, RN on: 04/22/2024 12:36 PM  Actions taken: Charge Capture section accepted

## 2024-04-24 ENCOUNTER — Encounter: Payer: Self-pay | Admitting: Internal Medicine

## 2024-04-25 ENCOUNTER — Other Ambulatory Visit (HOSPITAL_COMMUNITY): Payer: Self-pay | Admitting: *Deleted

## 2024-04-25 DIAGNOSIS — Z95811 Presence of heart assist device: Secondary | ICD-10-CM

## 2024-04-25 DIAGNOSIS — Z7901 Long term (current) use of anticoagulants: Secondary | ICD-10-CM

## 2024-04-28 ENCOUNTER — Other Ambulatory Visit (HOSPITAL_COMMUNITY): Payer: Self-pay

## 2024-04-29 ENCOUNTER — Ambulatory Visit (HOSPITAL_COMMUNITY)
Admission: RE | Admit: 2024-04-29 | Discharge: 2024-04-29 | Disposition: A | Discharge status: Home / Self Care | Source: Ambulatory Visit | Attending: Cardiology | Admitting: Cardiology

## 2024-04-29 ENCOUNTER — Ambulatory Visit (HOSPITAL_COMMUNITY): Payer: Self-pay | Admitting: Pharmacist

## 2024-04-29 DIAGNOSIS — Z4509 Encounter for adjustment and management of other cardiac device: Secondary | ICD-10-CM | POA: Insufficient documentation

## 2024-04-29 DIAGNOSIS — Z7901 Long term (current) use of anticoagulants: Secondary | ICD-10-CM

## 2024-04-29 DIAGNOSIS — Z95811 Presence of heart assist device: Secondary | ICD-10-CM | POA: Insufficient documentation

## 2024-04-29 DIAGNOSIS — I513 Intracardiac thrombosis, not elsewhere classified: Secondary | ICD-10-CM

## 2024-04-29 LAB — PROTIME-INR
INR: 1.5 — ABNORMAL HIGH (ref 0.8–1.2)
Prothrombin Time: 19.3 s — ABNORMAL HIGH (ref 11.4–15.2)

## 2024-04-29 NOTE — Progress Notes (Addendum)
 Patient presents for dressing change and INR in VAD Clinic today. Reports no problems with VAD equipment or concerns with drive line.   Pt reports concern over weight loss. States he has been feeling fine, but noticed his weight is down since taking Jardiance daily and Lasix  EOD. Reports good PO intake. Wt: 148 lbs w/ eqt today. (Down from previous wt 153.4 lbs w/eqt). Will weigh pt again next week. Advised to notify VAD coordinators if he is feeling unwell. He verbalized understanding.      Exit Site Care: Existing VAD dressing removed and site care performed using sterile technique. Drive line exit site cleaned with Chlora prep applicators x 2, allowed to dry, and Sorbaview dressing with Silverlon patch applied. Exit site healed and incorporated, the velour is fully implanted at exit site. No redness, tenderness, drainage, foul odor or rash noted. Drive line anchor re-applied. Pt denies fever or chills. Provided with 4 weekly kits for home use.    Plan: Return to clinic in 1 week for dressing change, wt check, and INR Coumadin  dosing per [place] PharmD (INR 1.5 today- boosted w/ need to recheck next week)  Isaiah Knoll RN VAD Coordinator  Office: [phone]  24/7 Pager: [phone]

## 2024-05-01 NOTE — Progress Notes (Addendum)
 LVAD CLINIC NOTE  PCP: Ziglar, Susan K, MD HF doc: DB  Chief complaint: HF  HPI:  Steven [name] is a 68 y.o. 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 Underwent HM-3 VAD placement 0n 03/08/23   Originally from [place], suffered a large out of hospital anterior wall myocardial infarction January 2012.  He presented after a three-hour delay to the hospital and was taken emergently for cath with DES to LAD. EF was 35-40% at time of MI. Unfortunately, did not have recovery of EF. C/w progressive decline in systolic function over the years. Ultimately underwent ICD.    Admitted on 02/26/23 w/ NYHA Class IV symptoms and low output. Echo showed EF < 20%, RV mildly reduced. RHC showed with low output. CI 2.1. Calcified pericardium (no H/o TB). Underwent HMIII LVAD implant on 03/08/23. Intra-op TEE LVEF 15%, RV normal.  Post op course was c/b afib w/ RVR, requiring IV amiodarone . Converted back to NSR.  Seen on 05/22/23 for unscheduled f/u due to low flow alarms on VAD. Pacer reprogrammed to let intrinsic rate come through (was RV pacing at 80). Feels much better. Walking 40 mins every day in the park. Denies orthopnea or PND. No fevers, chills or problems with driveline. No bleeding, melena or neuro symptoms. No VAD alarms. Taking all meds as prescribed.   Admitted 01/2024 after asymptomatic low flow alarm noted while in bed. Seen in LVAD clinic at which point it time he was orthostatic, dizzy, and hemoglobin came back at 8.4. Was admitted for GI workup which was largely negative. Remained dry during hospital visit, received multiple fluid boluses.   02/2024 was still orthostatic requiring IV fluid bolus, and his speed was reduced to 5400 given frequent suck down events. He underwent imaging that showed a suspicious chest wall mass near his thoracic duct.  Underwent PET scan that  showed no hypermetabolic uptake.    Admitted 03/2024 with acute hypoxic respiratory failure.  Workup and imaging consistent with volume overload, speed was increased and patient was started on low-dose diuretics.  Here for routine f/u. Feeling much better. Denies orthopnea or PND. No fevers, chills or problems with driveline. No bleeding, melena or neuro symptoms. No VAD alarms. Taking all meds as prescribed. Edema well controlled. Taking lasix  every other day  ReDS 31%   LVAD Documentation    04/15/2024  Device Info  LVAD Type: Heartmate III  Date of Implant: 03/08/2023  Therapy Type: Destination Therapy      04/15/2024  Vitals  Heart Rate: 74 BPM  Automatic BP: 112/94  Doppler MAP: 104 mmHg  SpO2: 100 %    Last 3 Weights Weight Weight  04/29/2024 67.132 kg 148 lb  04/15/2024 70.308 kg 155 lb  04/10/2024 69.31 kg 152 lb 12.8 oz       04/15/2024  LVAD Paramaters  Speed: 5600 RPM  Flow: 5 LPM  PI: 4  Power: 4 Watts  Hematocrit: 20 %  Alarms: none  Events: 40+  Last Speed Change Date: 04/08/2024  Last Ramp Echo Date: 04/08/2024  Last Right Heart Cath Date: 03/06/2023  Bleeding History: No  Type of Dressing: Weekly  Annual Maintenance Date: 03/25/2024    Labs    Units 04/29/24 1011 04/22/24 1021 04/15/24 0910 04/10/24 0955 04/08/24 0638  INR  1.5* 1.5* 1.9* 2.0* 2.1*  LDH U/L  --   --  206* 234* 219*  HGB g/dL  --   --  89.4* 89.5* 9.8*  CREATININE mg/dL  --   --  8.96 8.88 9.05           VAD Indication: Destination Therapy - pt preference, social issues   Past Medical History:  Diagnosis Date   Acute MI, anterior wall (HCC)    AICD (automatic cardioverter/defibrillator) present    CAD (coronary artery disease)    2D ECHO, 07/13/2011 - EF <25%, LV moderatelty dilated, LA moderately dilatedLEXISCAN, 12/14/2011 - moderate-severe perfusion defect seen in the basal anteroseptal, mid anterior, apicacl anterior, apical, apical inferior, and apical lateral  regions, post-stress EF 25%, new EKG changes from baseline abnormalities   Cancer (HCC)    Prostate   CHF (congestive heart failure) (HCC) 2012   Hypertension 08/08/2021   Inguinal hernia, left    Pneumonia    November 2023   Pre-diabetes     Current Outpatient Medications  Medication Sig Dispense Refill   acetaminophen  (TYLENOL ) 500 MG tablet Take 1,000 mg by mouth daily as needed for mild pain (pain score 1-3) or moderate pain (pain score 4-6).     amoxicillin -clavulanate (AUGMENTIN ) 875-125 MG tablet Take 1 tablet by mouth every 12 (twelve) hours. (Patient not taking: Reported on 04/15/2024) 4 tablet 0   atorvastatin  (LIPITOR ) 80 MG tablet Take 1 tablet (80 mg total) by mouth daily. 90 tablet 3   empagliflozin (JARDIANCE) 10 MG TABS tablet Take 1 tablet (10 mg total) by mouth daily before breakfast. 30 tablet 3   ferrous sulfate  325 (65 FE) MG tablet Take 1 tablet (325 mg total) by mouth daily with breakfast. 30 tablet 3   finasteride  (PROSCAR ) 5 MG tablet Take 1 tablet (5 mg total) by mouth daily. 90 tablet 3   furosemide  (LASIX ) 20 MG tablet Take 1 tablet (20 mg total) by mouth every other day. 20 tablet 3   Multiple Vitamins-Minerals (CERTAVITE/ANTIOXIDANTS) TABS Take 1 tablet by mouth daily. 130 tablet 0   pantoprazole  (PROTONIX ) 40 MG tablet Take 1 tablet (40 mg total) by mouth daily. 30 tablet 6   tamsulosin  (FLOMAX ) 0.4 MG CAPS capsule Take 1 capsule (0.4 mg total) by mouth daily. 90 capsule 3   traZODone  (DESYREL ) 50 MG tablet Take 1 tablet (50 mg total) by mouth at bedtime as needed for sleep. 30 tablet 3   warfarin (COUMADIN ) 2.5 MG tablet Take 2.5 mg (1 tablet) every Mon,Wed, Fri; 5 mg (2 tablets) all other days; or as directed by heart failure clinic 60 tablet 6   docusate sodium  (COLACE) 100 MG capsule Take 2 capsules (200 mg total) by mouth daily. (Patient not taking: Reported on 04/15/2024) 100 capsule 0   losartan  (COZAAR ) 25 MG tablet Take 0.5 tablets (12.5 mg total) by  mouth daily. 90 tablet 3   No current facility-administered medications for this encounter.    Patient has no known allergies.  REVIEW OF SYSTEMS: All systems negative except as listed in HPI, PMH and Problem list.   Physical Exam: General:  NAD.  HEENT: normal  Neck: supple. JVP not elevated.  Carotids 2+ bilat; no bruits. No lymphadenopathy or thryomegaly appreciated. Cor: LVAD hum.  Lungs: Clear. Abdomen soft, nontender, non-distended. No hepatosplenomegaly. No bruits or masses. Good bowel sounds. Driveline site clean. Anchor in place.  Extremities: no cyanosis, clubbing, rash. Warm no edema  Neuro: alert & oriented x 3. No focal deficits. Moves all 4 without problem     ASSESSMENT AND PLAN:  Chronic combined systolic and diastolic heart failure - Due to ICM with anterior MI in 2012 - Echo 02/27/23: EF <20%, LV with GHK, RV mildly reduced, GIIDD, LA mod dilated, mild MR.  End-stage HF. GDMT limited by renal function and hypotension.  - S/P HM3 LVAD implant 03/08/23 - Doing well. NYHA II.  - Continue losartan  12.5mg  daily - Will resume jardiance 10mg  daily - Volume status looks good. REDs 31% Continue lasix  40mg  every other day  VAD  - RAMP echo 3/25 speed turned down from 5700 -> 5600 - Speed further decreased on 8/28 to 5400 for frequent suck downs - After appetite improved, speed increased back to 5600RPM during 03/2024 admission - VAD interrogated personally. Parameters stable. - DL ok - Labs stable LDH 765 - INR 2.0 goal 2-3, Discussed warfarin dosing with PharmD personally.  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  - No s/s angina - off asa as above.  - Continue statin   CKD stage 3: Improved - cardiorenal, resolved w/ LVAD support - Scr stable at 1.11   H/o VT s/p Medtronic ICD: likely scar mediated - No recent VT - off amio  Paroxysmal Atrial fibrillation:  - Remains in NSR. On warfarin  - INR management as above  I spent a total of  40 minutes today: 1) reviewing the patient's medical records including previous charts, labs and recent notes from other providers; 2) examining the patient and counseling them on their medical issues/explaining the plan of care; 3) adjusting meds as needed and 4) ordering lab work or other needed tests.    Toribio Fuel, MD  7:15 AM

## 2024-05-02 ENCOUNTER — Other Ambulatory Visit (HOSPITAL_COMMUNITY): Payer: Self-pay | Admitting: *Deleted

## 2024-05-02 DIAGNOSIS — Z95811 Presence of heart assist device: Secondary | ICD-10-CM

## 2024-05-02 DIAGNOSIS — Z7901 Long term (current) use of anticoagulants: Secondary | ICD-10-CM

## 2024-05-06 ENCOUNTER — Other Ambulatory Visit: Payer: Self-pay

## 2024-05-06 ENCOUNTER — Ambulatory Visit (HOSPITAL_COMMUNITY)
Admission: RE | Admit: 2024-05-06 | Discharge: 2024-05-06 | Disposition: A | Discharge status: Home / Self Care | Source: Ambulatory Visit | Attending: Cardiology | Admitting: Cardiology

## 2024-05-06 ENCOUNTER — Other Ambulatory Visit (HOSPITAL_COMMUNITY): Payer: Self-pay

## 2024-05-06 ENCOUNTER — Ambulatory Visit (HOSPITAL_COMMUNITY): Payer: Self-pay | Admitting: Pharmacist

## 2024-05-06 DIAGNOSIS — Z7901 Long term (current) use of anticoagulants: Secondary | ICD-10-CM

## 2024-05-06 DIAGNOSIS — Z95811 Presence of heart assist device: Secondary | ICD-10-CM

## 2024-05-06 DIAGNOSIS — I5022 Chronic systolic (congestive) heart failure: Secondary | ICD-10-CM

## 2024-05-06 DIAGNOSIS — I513 Intracardiac thrombosis, not elsewhere classified: Secondary | ICD-10-CM

## 2024-05-06 LAB — PROTIME-INR
INR: 1.6 — ABNORMAL HIGH (ref 0.8–1.2)
Prothrombin Time: 19.6 s — ABNORMAL HIGH (ref 11.4–15.2)

## 2024-05-06 MED ORDER — FUROSEMIDE 20 MG PO TABS
20.0000 mg | ORAL_TABLET | ORAL | 3 refills | Status: AC
  Filled 2024-05-06 – 2024-05-09 (×2): qty 45, 90d supply, fill #0

## 2024-05-06 MED ORDER — WARFARIN SODIUM 2.5 MG PO TABS
ORAL_TABLET | ORAL | 3 refills | Status: DC
  Filled 2024-05-06: qty 180, 90d supply, fill #0

## 2024-05-06 MED ORDER — EMPAGLIFLOZIN 10 MG PO TABS
10.0000 mg | ORAL_TABLET | Freq: Every day | ORAL | 3 refills | Status: AC
  Filled 2024-05-06: qty 90, 90d supply, fill #0
  Filled 2024-07-26: qty 90, 90d supply, fill #1

## 2024-05-06 NOTE — Progress Notes (Signed)
 Patient presents for dressing change and INR in VAD Clinic today. Reports no problems with VAD equipment or concerns with drive line.   Refills sent for Lasix  and Jardiance today. Feeling better this week. Weight stable: 154.2 lbs.      Exit Site Care: Existing VAD dressing removed and site care performed using sterile technique. Drive line exit site cleaned with Chlora prep applicators x 2, allowed to dry, and Sorbaview dressing with Silverlon patch applied. Exit site healed and incorporated, the velour is fully implanted at exit site. No redness, tenderness, drainage, foul odor or rash noted. Drive line anchor re-applied. Pt denies fever or chills. Pt has adequate dressing supplies at home.    Plan: Return to clinic in 1 week for dressing change Coumadin  dosing per Nason PharmD   Isaiah Knoll RN VAD Coordinator  Office: [phone]  24/7 Pager: [phone]

## 2024-05-07 ENCOUNTER — Other Ambulatory Visit: Payer: Self-pay

## 2024-05-09 ENCOUNTER — Other Ambulatory Visit (HOSPITAL_COMMUNITY): Payer: Self-pay | Admitting: *Deleted

## 2024-05-09 ENCOUNTER — Other Ambulatory Visit: Payer: Self-pay

## 2024-05-09 DIAGNOSIS — Z95811 Presence of heart assist device: Secondary | ICD-10-CM

## 2024-05-09 DIAGNOSIS — Z7901 Long term (current) use of anticoagulants: Secondary | ICD-10-CM

## 2024-05-12 ENCOUNTER — Other Ambulatory Visit (HOSPITAL_COMMUNITY): Payer: Self-pay

## 2024-05-13 ENCOUNTER — Ambulatory Visit (HOSPITAL_COMMUNITY): Payer: Self-pay | Admitting: Pharmacist

## 2024-05-13 ENCOUNTER — Ambulatory Visit (HOSPITAL_COMMUNITY)
Admission: RE | Admit: 2024-05-13 | Discharge: 2024-05-13 | Disposition: A | Discharge status: Home / Self Care | Source: Ambulatory Visit | Attending: Cardiology | Admitting: Cardiology

## 2024-05-13 ENCOUNTER — Other Ambulatory Visit (HOSPITAL_COMMUNITY): Payer: Self-pay

## 2024-05-13 DIAGNOSIS — Z7901 Long term (current) use of anticoagulants: Secondary | ICD-10-CM

## 2024-05-13 DIAGNOSIS — Z4801 Encounter for change or removal of surgical wound dressing: Secondary | ICD-10-CM | POA: Diagnosis not present

## 2024-05-13 DIAGNOSIS — Z95811 Presence of heart assist device: Secondary | ICD-10-CM | POA: Diagnosis present

## 2024-05-13 DIAGNOSIS — I513 Intracardiac thrombosis, not elsewhere classified: Secondary | ICD-10-CM

## 2024-05-13 LAB — PROTIME-INR
INR: 1.5 — ABNORMAL HIGH (ref 0.8–1.2)
Prothrombin Time: 18.9 s — ABNORMAL HIGH (ref 11.4–15.2)

## 2024-05-13 NOTE — Addendum Note (Signed)
 Encounter addended by: Dante Jeannine HERO, CMA on: 05/13/2024 10:25 AM  Actions taken: Child order released for a procedure order

## 2024-05-13 NOTE — Progress Notes (Signed)
 Patient presents for dressing change and INR in VAD Clinic today. Reports no problems with VAD equipment or concerns with drive line.    Exit Site Care: Existing VAD dressing removed and site care performed using sterile technique. Drive line exit site cleaned with Chlora prep applicators x 2, allowed to dry, and Sorbaview dressing with Silverlon patch applied. Exit site healed and incorporated, the velour is fully implanted at exit site. No redness, tenderness, drainage, foul odor or rash noted. Drive line anchor re-applied. Pt denies fever or chills. Pt has adequate dressing supplies at home.    Plan: Return to clinic in 1 week for dressing change Coumadin  dosing per Nason PharmD   Isaiah Knoll RN VAD Coordinator  Office: [phone]  24/7 Pager: [phone]

## 2024-05-14 ENCOUNTER — Other Ambulatory Visit (HOSPITAL_COMMUNITY): Payer: Self-pay

## 2024-05-16 ENCOUNTER — Other Ambulatory Visit (HOSPITAL_COMMUNITY): Payer: Self-pay

## 2024-05-16 DIAGNOSIS — Z95811 Presence of heart assist device: Secondary | ICD-10-CM

## 2024-05-16 DIAGNOSIS — Z7901 Long term (current) use of anticoagulants: Secondary | ICD-10-CM

## 2024-05-20 ENCOUNTER — Ambulatory Visit (HOSPITAL_COMMUNITY)
Admission: RE | Admit: 2024-05-20 | Discharge: 2024-05-20 | Disposition: A | Discharge status: Home / Self Care | Source: Ambulatory Visit | Attending: Cardiology | Admitting: Cardiology

## 2024-05-20 ENCOUNTER — Ambulatory Visit (INDEPENDENT_AMBULATORY_CARE_PROVIDER_SITE_OTHER): Payer: Medicaid Other

## 2024-05-20 ENCOUNTER — Other Ambulatory Visit (HOSPITAL_COMMUNITY): Payer: Self-pay | Admitting: *Deleted

## 2024-05-20 ENCOUNTER — Ambulatory Visit: Payer: Self-pay | Admitting: Pharmacist

## 2024-05-20 ENCOUNTER — Ambulatory Visit (HOSPITAL_COMMUNITY): Payer: Self-pay | Admitting: Pharmacist

## 2024-05-20 DIAGNOSIS — I5022 Chronic systolic (congestive) heart failure: Secondary | ICD-10-CM | POA: Diagnosis not present

## 2024-05-20 DIAGNOSIS — Z7901 Long term (current) use of anticoagulants: Secondary | ICD-10-CM | POA: Diagnosis not present

## 2024-05-20 DIAGNOSIS — Z95811 Presence of heart assist device: Secondary | ICD-10-CM | POA: Insufficient documentation

## 2024-05-20 DIAGNOSIS — Z4509 Encounter for adjustment and management of other cardiac device: Secondary | ICD-10-CM | POA: Diagnosis present

## 2024-05-20 DIAGNOSIS — I513 Intracardiac thrombosis, not elsewhere classified: Secondary | ICD-10-CM

## 2024-05-20 LAB — PROTIME-INR
INR: 1.8 — ABNORMAL HIGH (ref 0.8–1.2)
Prothrombin Time: 21.6 s — ABNORMAL HIGH (ref 11.4–15.2)

## 2024-05-20 NOTE — Progress Notes (Signed)
 Patient presents for dressing change and INR in VAD Clinic today. Reports no problems with VAD equipment or concerns with drive line.    Exit Site Care: Existing VAD dressing removed and site care performed using sterile technique. Drive line exit site cleaned with Chlora prep applicators x 2, allowed to dry, and Sorbaview dressing with Silverlon patch applied. Exit site healed and incorporated, the velour is fully implanted at exit site. No redness, tenderness, drainage, foul odor or rash noted. Drive line anchor re-applied. Pt denies fever or chills. Pt has adequate dressing supplies at home.    Plan: Return to clinic in 1 week for dressing change Coumadin  dosing per Nason PharmD   Isaiah Knoll RN VAD Coordinator  Office: [phone]  24/7 Pager: [phone]

## 2024-05-21 LAB — CUP PACEART REMOTE DEVICE CHECK
Battery Remaining Longevity: 64 mo
Battery Voltage: 2.98 V
Brady Statistic AP VP Percent: 0 %
Brady Statistic AP VS Percent: 0.02 %
Brady Statistic AS VP Percent: 0.07 %
Brady Statistic AS VS Percent: 99.91 %
Brady Statistic RA Percent Paced: 0.02 %
Brady Statistic RV Percent Paced: 0.07 %
Date Time Interrogation Session: 20251125072406
HighPow Impedance: 43 Ohm
HighPow Impedance: 62 Ohm
Implantable Lead Connection Status: 753985
Implantable Lead Connection Status: 753985
Implantable Lead Implant Date: 20120906
Implantable Lead Implant Date: 20120906
Implantable Lead Location: 753859
Implantable Lead Location: 753860
Implantable Lead Model: 185
Implantable Lead Model: 5076
Implantable Lead Serial Number: 358872
Implantable Pulse Generator Implant Date: 20200831
Lead Channel Impedance Value: 323 Ohm
Lead Channel Impedance Value: 323 Ohm
Lead Channel Impedance Value: 399 Ohm
Lead Channel Pacing Threshold Amplitude: 0.75 V
Lead Channel Pacing Threshold Amplitude: 1.25 V
Lead Channel Pacing Threshold Pulse Width: 0.4 ms
Lead Channel Pacing Threshold Pulse Width: 0.4 ms
Lead Channel Sensing Intrinsic Amplitude: 1.25 mV
Lead Channel Sensing Intrinsic Amplitude: 1.25 mV
Lead Channel Sensing Intrinsic Amplitude: 1.5 mV
Lead Channel Sensing Intrinsic Amplitude: 1.5 mV
Lead Channel Setting Pacing Amplitude: 1.5 V
Lead Channel Setting Pacing Amplitude: 2.5 V
Lead Channel Setting Pacing Pulse Width: 0.4 ms
Lead Channel Setting Sensing Sensitivity: 0.3 mV
Zone Setting Status: 755011

## 2024-05-23 ENCOUNTER — Ambulatory Visit: Payer: Self-pay | Admitting: Cardiovascular Disease

## 2024-05-23 NOTE — Progress Notes (Signed)
 Remote ICD Transmission

## 2024-05-26 ENCOUNTER — Other Ambulatory Visit (HOSPITAL_COMMUNITY): Payer: Self-pay

## 2024-05-27 ENCOUNTER — Ambulatory Visit (HOSPITAL_COMMUNITY)
Admission: RE | Admit: 2024-05-27 | Discharge: 2024-05-27 | Disposition: A | Source: Ambulatory Visit | Attending: Cardiology

## 2024-05-27 DIAGNOSIS — Z95811 Presence of heart assist device: Secondary | ICD-10-CM | POA: Insufficient documentation

## 2024-05-27 DIAGNOSIS — Z4801 Encounter for change or removal of surgical wound dressing: Secondary | ICD-10-CM | POA: Insufficient documentation

## 2024-05-27 NOTE — Progress Notes (Addendum)
 Patient presents for dressing change in VAD Clinic today. Reports no problems with VAD equipment or concerns with drive line.    Exit Site Care: Existing VAD dressing removed and site care performed using sterile technique. Drive line exit site cleaned with Chlora prep applicators x 2, allowed to dry, and Sorbaview dressing with Silverlon patch applied. Exit site healed and incorporated, the velour is fully implanted at exit site. No redness, tenderness, drainage, foul odor or rash noted. Drive line anchor re-applied. Pt denies fever or chills. Provided with 4 weekly kits for home use.    Plan: Return to clinic in 1 week for dressing change & INR  Isaiah Knoll RN VAD Coordinator  Office: [phone]  24/7 Pager: [phone]

## 2024-05-27 NOTE — Addendum Note (Signed)
 Encounter addended by: Berdine Isaiah NOVAK, RN on: 05/27/2024 1:58 PM  Actions taken: Clinical Note Signed

## 2024-05-29 ENCOUNTER — Other Ambulatory Visit (HOSPITAL_COMMUNITY): Payer: Self-pay | Admitting: *Deleted

## 2024-05-29 DIAGNOSIS — Z7901 Long term (current) use of anticoagulants: Secondary | ICD-10-CM

## 2024-05-29 DIAGNOSIS — Z95811 Presence of heart assist device: Secondary | ICD-10-CM

## 2024-06-03 ENCOUNTER — Ambulatory Visit (HOSPITAL_COMMUNITY): Admission: RE | Admit: 2024-06-03

## 2024-06-04 ENCOUNTER — Ambulatory Visit (HOSPITAL_COMMUNITY): Payer: Self-pay | Admitting: Pharmacist

## 2024-06-04 ENCOUNTER — Ambulatory Visit (HOSPITAL_COMMUNITY): Admission: RE | Admit: 2024-06-04 | Discharge: 2024-06-04 | Attending: Cardiology

## 2024-06-04 DIAGNOSIS — I513 Intracardiac thrombosis, not elsewhere classified: Secondary | ICD-10-CM

## 2024-06-04 DIAGNOSIS — Z95811 Presence of heart assist device: Secondary | ICD-10-CM | POA: Diagnosis not present

## 2024-06-04 DIAGNOSIS — Z7901 Long term (current) use of anticoagulants: Secondary | ICD-10-CM

## 2024-06-04 LAB — PROTIME-INR
INR: 1.7 — ABNORMAL HIGH (ref 0.8–1.2)
Prothrombin Time: 21.3 s — ABNORMAL HIGH (ref 11.4–15.2)

## 2024-06-04 NOTE — Progress Notes (Signed)
 Patient presents for dressing change and INR in VAD Clinic today. Reports no problems with VAD equipment or concerns with drive line.    Exit Site Care: Existing VAD dressing removed and site care performed using sterile technique. Drive line exit site cleaned with Chlora prep applicators x 2, allowed to dry, and Sorbaview dressing with Silverlon patch applied. Exit site healed and incorporated, the velour is fully implanted at exit site. No redness, tenderness, drainage, foul odor or rash noted. Drive line anchor re-applied. Pt denies fever or chills. Pt has adequate dressing supplies for home use.    Plan: Return to clinic in 1 week for full visit with Dr Bensimhon Coumadin  dosing per Nason PharmD  Isaiah Knoll RN VAD Coordinator  Office: [phone]  24/7 Pager: [phone]

## 2024-06-05 ENCOUNTER — Other Ambulatory Visit (HOSPITAL_BASED_OUTPATIENT_CLINIC_OR_DEPARTMENT_OTHER): Payer: Self-pay

## 2024-06-05 ENCOUNTER — Other Ambulatory Visit: Payer: Self-pay

## 2024-06-05 ENCOUNTER — Other Ambulatory Visit (HOSPITAL_COMMUNITY): Payer: Self-pay

## 2024-06-05 MED ORDER — PHENAZOPYRIDINE HCL 200 MG PO TABS
200.0000 mg | ORAL_TABLET | Freq: Three times a day (TID) | ORAL | 0 refills | Status: AC
  Filled 2024-06-05: qty 9, 3d supply, fill #0

## 2024-06-07 ENCOUNTER — Other Ambulatory Visit (HOSPITAL_COMMUNITY): Payer: Self-pay

## 2024-06-11 ENCOUNTER — Other Ambulatory Visit (HOSPITAL_COMMUNITY): Payer: Self-pay

## 2024-06-11 DIAGNOSIS — Z7901 Long term (current) use of anticoagulants: Secondary | ICD-10-CM

## 2024-06-11 DIAGNOSIS — Z95811 Presence of heart assist device: Secondary | ICD-10-CM

## 2024-06-12 ENCOUNTER — Other Ambulatory Visit (HOSPITAL_COMMUNITY): Payer: Self-pay

## 2024-06-12 ENCOUNTER — Ambulatory Visit (HOSPITAL_COMMUNITY): Payer: Self-pay | Admitting: Pharmacist

## 2024-06-12 ENCOUNTER — Ambulatory Visit (HOSPITAL_COMMUNITY)
Admission: RE | Admit: 2024-06-12 | Discharge: 2024-06-12 | Disposition: A | Discharge status: Home / Self Care | Source: Ambulatory Visit | Attending: Internal Medicine | Admitting: Internal Medicine

## 2024-06-12 VITALS — BP 100/0 | HR 60 | Wt 149.2 lb

## 2024-06-12 DIAGNOSIS — Z7901 Long term (current) use of anticoagulants: Secondary | ICD-10-CM | POA: Diagnosis not present

## 2024-06-12 DIAGNOSIS — I2510 Atherosclerotic heart disease of native coronary artery without angina pectoris: Secondary | ICD-10-CM | POA: Diagnosis not present

## 2024-06-12 DIAGNOSIS — I252 Old myocardial infarction: Secondary | ICD-10-CM | POA: Insufficient documentation

## 2024-06-12 DIAGNOSIS — I5084 End stage heart failure: Secondary | ICD-10-CM | POA: Diagnosis not present

## 2024-06-12 DIAGNOSIS — I5022 Chronic systolic (congestive) heart failure: Secondary | ICD-10-CM | POA: Diagnosis not present

## 2024-06-12 DIAGNOSIS — Z95811 Presence of heart assist device: Secondary | ICD-10-CM | POA: Diagnosis not present

## 2024-06-12 DIAGNOSIS — I130 Hypertensive heart and chronic kidney disease with heart failure and stage 1 through stage 4 chronic kidney disease, or unspecified chronic kidney disease: Secondary | ICD-10-CM | POA: Diagnosis not present

## 2024-06-12 DIAGNOSIS — Z79899 Other long term (current) drug therapy: Secondary | ICD-10-CM | POA: Insufficient documentation

## 2024-06-12 DIAGNOSIS — I513 Intracardiac thrombosis, not elsewhere classified: Secondary | ICD-10-CM

## 2024-06-12 DIAGNOSIS — I5042 Chronic combined systolic (congestive) and diastolic (congestive) heart failure: Secondary | ICD-10-CM | POA: Insufficient documentation

## 2024-06-12 DIAGNOSIS — Z7984 Long term (current) use of oral hypoglycemic drugs: Secondary | ICD-10-CM | POA: Insufficient documentation

## 2024-06-12 DIAGNOSIS — I255 Ischemic cardiomyopathy: Secondary | ICD-10-CM | POA: Diagnosis not present

## 2024-06-12 DIAGNOSIS — N1831 Chronic kidney disease, stage 3a: Secondary | ICD-10-CM | POA: Insufficient documentation

## 2024-06-12 DIAGNOSIS — Z95810 Presence of automatic (implantable) cardiac defibrillator: Secondary | ICD-10-CM | POA: Insufficient documentation

## 2024-06-12 DIAGNOSIS — I480 Paroxysmal atrial fibrillation: Secondary | ICD-10-CM | POA: Insufficient documentation

## 2024-06-12 DIAGNOSIS — Z4509 Encounter for adjustment and management of other cardiac device: Secondary | ICD-10-CM | POA: Insufficient documentation

## 2024-06-12 LAB — PROTIME-INR
INR: 2.1 — ABNORMAL HIGH (ref 0.8–1.2)
Prothrombin Time: 25 s — ABNORMAL HIGH (ref 11.4–15.2)

## 2024-06-12 LAB — CBC
HCT: 43.8 % (ref 39.0–52.0)
Hemoglobin: 14.4 g/dL (ref 13.0–17.0)
MCH: 30.2 pg (ref 26.0–34.0)
MCHC: 32.9 g/dL (ref 30.0–36.0)
MCV: 91.8 fL (ref 80.0–100.0)
Platelets: 205 K/uL (ref 150–400)
RBC: 4.77 MIL/uL (ref 4.22–5.81)
RDW: 14.1 % (ref 11.5–15.5)
WBC: 5.8 K/uL (ref 4.0–10.5)
nRBC: 0 % (ref 0.0–0.2)

## 2024-06-12 NOTE — Progress Notes (Signed)
 Patient presents for 2 month follow up in VAD Clinic today. Reports no problems with VAD equipment or concerns with drive line.   Patient reports he has been feeling good recently. Denies limitations. He continues to work daily. Denies lightheadedness, dizziness, falls, shortness of breath, and signs of bleeding. He reports some intermittent left sided chest pain without any identifiable precipitating factors. Dr.Bensimhon made aware. Advised for pt to notify VAD team if chest pain continues.    Vital Signs:   Doppler BP: 100 Automatic BP: 96/84 (90) HR: 60 NSR SPO2: 96   Weight: 149.2 lb w/ eqt Last weight: 153.4 lb w/ eqt  VAD Indication: Destination Therapy - pt preference, social issues   VAD interrogation & Equipment Management: Speed: 5600 Flow: 4.6 Power: 4.3 w PI: 3.6   Alarms: none Events: 40+  Fixed speed: 5600 Low speed limit:  5300   Primary Controller:  Replace back up battery in 20 months. Back up controller:  Not present  Annual Equipment Maintenance on UBC/PM was performed on 03/25/24.    I reviewed the LVAD parameters from today and compared the results to the patient's prior recorded data. LVAD interrogation was NEGATIVE for significant power changes, NEGATIVE for clinical alarms and STABLE for PI events/speed drops. No programming changes were made and pump is functioning within specified parameters. Pt is performing daily controller and system monitor self tests along with completing weekly and monthly maintenance for LVAD equipment.   LVAD equipment check completed and is in good working order. Back-up equipment NOT present. Pt asked to bring back up equipment with him next visit.   Exit Site Care: Existing VAD dressing removed and site care performed using sterile technique. Drive line exit site cleaned with Chlora prep applicators x 2, allowed to dry, and Sorbaview dressing with Silverlon patch applied. Exit site healed and incorporated, the velour is fully  implanted at exit site. No redness, tenderness, drainage, foul odor or rash noted. Drive line anchor re-applied. Pt denies fever or chills.    Significant Events on VAD Support:   Device: Medtronic Therapies: on VF 200 BPM Last check: 11/20/23  BP & Labs:  MAP 100 - Doppler is reflecting modified systolic   Hgb 14.4 - No S/S of bleeding. Specifically denies melena/BRBPR or nosebleeds.   LDH stable at  with established baseline of 230-290. Denies tea-colored urine. No power elevations noted on interrogation.   Lab requesting BMET and LDH be recollected. Will recollect next week at visit.   Plan: Return to clinic in 1 week for dressing change Return 2 months for follow up with Dr. Cherrie Schuyler Lunger RN, BSN VAD Coordinator 24/7 Pager [phone]

## 2024-06-12 NOTE — Progress Notes (Signed)
 LVAD CLINIC NOTE  PCP: Ziglar, Susan K, MD HF doc: DB  Chief complaint: HF  HPI:  Steven [name] is a 68 y.o. 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 Underwent HM-3 VAD placement 0n 03/08/23   Originally from [place], suffered a large out of hospital anterior wall myocardial infarction January 2012.  He presented after a three-hour delay to the hospital and was taken emergently for cath with DES to LAD. EF was 35-40% at time of MI. Unfortunately, did not have recovery of EF. C/w progressive decline in systolic function over the years. Ultimately underwent ICD.    Admitted on 02/26/23 w/ NYHA Class IV symptoms and low output. Echo showed EF < 20%, RV mildly reduced. RHC showed with low output. CI 2.1. Calcified pericardium (no H/o TB). Underwent HMIII LVAD implant on 03/08/23. Intra-op TEE LVEF 15%, RV normal.  Post op course was c/b afib w/ RVR, requiring IV amiodarone . Converted back to NSR.  Seen on 05/22/23 for unscheduled f/u due to low flow alarms on VAD. Pacer reprogrammed to let intrinsic rate come through (was RV pacing at 80). Feels much better. Walking 40 mins every day in the park. Denies orthopnea or PND. No fevers, chills or problems with driveline. No bleeding, melena or neuro symptoms. No VAD alarms. Taking all meds as prescribed.   Admitted 01/2024 after asymptomatic low flow alarm noted while in bed. Seen in LVAD clinic at which point it time he was orthostatic, dizzy, and hemoglobin came back at 8.4. Was admitted for GI workup which was largely negative. Remained dry during hospital visit, received multiple fluid boluses.   02/2024 was still orthostatic requiring IV fluid bolus, and his speed was reduced to 5400 given frequent suck down events. He underwent imaging that showed a suspicious chest wall mass near his thoracic duct.  Underwent PET scan that  showed no hypermetabolic uptake.    Admitted 03/2024 with acute hypoxic respiratory failure.  Workup and imaging consistent with volume overload, speed was increased and patient was started on low-dose diuretics.  Here for routine f/u. Feels good. Back to work. Energy improved. Occasional CP. Can come at any time and then resolves. No prolonged episodes. Not realted to exertion. Denies orthopnea or PND. No fevers, chills or problems with driveline. No bleeding, melena or neuro symptoms. No VAD alarms. Taking all meds as prescribed.     LVAD Documentation    04/15/2024  Device Info  LVAD Type: Heartmate III  Date of Implant: 03/08/2023  Therapy Type: Destination Therapy      04/15/2024  Vitals  Heart Rate: 74 BPM  Automatic BP: 112/94  Doppler MAP: 104 mmHg  SpO2: 100 %    Last 3 Weights Weight Weight  05/06/2024 69.945 kg 154 lb 3.2 oz  04/29/2024 67.132 kg 148 lb  04/15/2024 70.308 kg 155 lb       04/15/2024  LVAD Paramaters  Speed: 5600 RPM  Flow: 5 LPM  PI: 4  Power: 4 Watts  Hematocrit: 20 %  Alarms: none  Events: 40+  Last Speed Change Date: 04/08/2024  Last Ramp Echo Date: 04/08/2024  Last Right Heart Cath Date: 03/06/2023  Bleeding History: No  Type of Dressing: Weekly  Annual Maintenance Date: 03/25/2024    Labs    Units 06/04/24 1052 05/20/24 1018 05/13/24 1025 04/22/24 1021 04/15/24 0910 04/10/24 0955 04/08/24 9361  INR  1.7* 1.8* 1.5*   < > 1.9* 2.0* 2.1*  LDH U/L  --   --   --   --  206* 234* 219*  HGB g/dL  --   --   --   --  89.4* 10.4* 9.8*  CREATININE mg/dL  --   --   --   --  8.96 1.11 0.94   < > = values in this interval not displayed.           VAD Indication: Destination Therapy - pt preference, social issues   Past Medical History:  Diagnosis Date   Acute MI, anterior wall (HCC)    AICD (automatic cardioverter/defibrillator) present    CAD (coronary artery disease)    2D ECHO, 07/13/2011 - EF <25%, LV moderatelty dilated, LA  moderately dilatedLEXISCAN, 12/14/2011 - moderate-severe perfusion defect seen in the basal anteroseptal, mid anterior, apicacl anterior, apical, apical inferior, and apical lateral regions, post-stress EF 25%, new EKG changes from baseline abnormalities   Cancer (HCC)    Prostate   CHF (congestive heart failure) (HCC) 2012   Hypertension 08/08/2021   Inguinal hernia, left    Pneumonia    November 2023   Pre-diabetes     Current Outpatient Medications  Medication Sig Dispense Refill   acetaminophen  (TYLENOL ) 500 MG tablet Take 1,000 mg by mouth daily as needed for mild pain (pain score 1-3) or moderate pain (pain score 4-6).     amoxicillin -clavulanate (AUGMENTIN ) 875-125 MG tablet Take 1 tablet by mouth every 12 (twelve) hours. (Patient not taking: Reported on 04/15/2024) 4 tablet 0   atorvastatin  (LIPITOR ) 80 MG tablet Take 1 tablet (80 mg total) by mouth daily. 90 tablet 3   docusate sodium  (COLACE) 100 MG capsule Take 2 capsules (200 mg total) by mouth daily. (Patient not taking: Reported on 04/15/2024) 100 capsule 0   empagliflozin  (JARDIANCE ) 10 MG TABS tablet Take 1 tablet (10 mg total) by mouth daily before breakfast. 90 tablet 3   ferrous sulfate  325 (65 FE) MG tablet Take 1 tablet (325 mg total) by mouth daily with breakfast. 30 tablet 3   finasteride  (PROSCAR ) 5 MG tablet Take 1 tablet (5 mg total) by mouth daily. 90 tablet 3   furosemide  (LASIX ) 20 MG tablet Take 1 tablet (20 mg total) by mouth every other day. 45 tablet 3   losartan  (COZAAR ) 25 MG tablet Take 0.5 tablets (12.5 mg total) by mouth daily. 90 tablet 3   Multiple Vitamins-Minerals (CERTAVITE/ANTIOXIDANTS) TABS Take 1 tablet by mouth daily. 130 tablet 0   pantoprazole  (PROTONIX ) 40 MG tablet Take 1 tablet (40 mg total) by mouth daily. 30 tablet 6   phenazopyridine  (PYRIDIUM ) 200 MG tablet Take 1 tablet (200 mg total) by mouth 3 (three) times daily. 9 tablet 0   tamsulosin  (FLOMAX ) 0.4 MG CAPS capsule Take 1 capsule  (0.4 mg total) by mouth daily. 90 capsule 3   traZODone  (DESYREL ) 50 MG tablet Take 1 tablet (50 mg total) by mouth at bedtime as needed for sleep. 30 tablet 3   warfarin (COUMADIN ) 2.5 MG tablet Take 5 mg (2 tablets) by mouth daily; or as directed by heart failure clinic 180 tablet 3   No current facility-administered medications for this encounter.    Patient has no known allergies.  REVIEW OF SYSTEMS: All systems negative except as listed in HPI, PMH and Problem list.   Physical Exam: General:  Thin male NAD.  HEENT: normal  Neck: supple. JVP not elevated.  Carotids 2+ bilat; no bruits. No lymphadenopathy or thryomegaly appreciated. Cor: LVAD hum.  Lungs: Clear. Abdomen: obese soft, nontender, non-distended. No hepatosplenomegaly. No bruits or masses. Good bowel sounds. Driveline site clean. Anchor in place.  Extremities: no cyanosis, clubbing, rash. Warm no edema  Neuro: alert & oriented x 3. No focal deficits. Moves all 4 without problem   ASSESSMENT AND PLAN:  Chronic combined systolic and diastolic heart failure - Due to ICM with anterior MI in 2012 - Echo 02/27/23: EF <20%, LV with GHK, RV mildly reduced, GIIDD, LA mod dilated, mild MR.  End-stage HF. GDMT limited by renal function and hypotension.  - S/P HM3 LVAD implant 03/08/23 - NYHA II volume ok  - Continue losartan  12.5mg  daily - Continue jardiance  10mg  daily - Continue lasix  40mg  every other day, hold for dizzy or lightheadedness  VAD  - RAMP echo 3/25 speed turned down from 5700 -> 5600 - Speed further decreased on 8/28 to 5400 for frequent suck downs - After appetite improved, speed increased back to 5600RPM during 03/2024 admission - VAD interrogated personally. Parameters stable. - LDH and hgb ok - DL site ok - INR pending goal 2-3 will discuss warfarin dosing with pharmd  CAD:  - h/o large anterior MI 2012 treated w/ DES to LAD.  - Occasional CP but does not sound angina - off asa as above.  - Continue  statin   CKD stage 3: Improved - cardiorenal, resolved w/ LVAD support - Scr stable   H/o VT s/p Medtronic ICD: likely scar mediated - No recent VT - Off amio  Paroxysmal Atrial fibrillation:  - Remains in NSR On warfarin  - INR management as above  HTN - MAPs Improved with recent med changes  I spent a total of 42 minutes today: 1) reviewing the patient's medical records including previous charts, labs and recent notes from other providers; 2) examining the patient and counseling them on their medical issues/explaining the plan of care; 3) adjusting meds as needed and 4) ordering lab work or other needed tests.     Toribio Fuel, MD  8:50 AM

## 2024-06-17 ENCOUNTER — Ambulatory Visit (HOSPITAL_COMMUNITY)
Admission: RE | Admit: 2024-06-17 | Discharge: 2024-06-17 | Disposition: A | Discharge status: Home / Self Care | Source: Ambulatory Visit | Attending: Internal Medicine | Admitting: Internal Medicine

## 2024-06-17 ENCOUNTER — Other Ambulatory Visit (HOSPITAL_COMMUNITY): Payer: Self-pay

## 2024-06-17 DIAGNOSIS — Z95811 Presence of heart assist device: Secondary | ICD-10-CM | POA: Insufficient documentation

## 2024-06-17 DIAGNOSIS — Z4509 Encounter for adjustment and management of other cardiac device: Secondary | ICD-10-CM | POA: Insufficient documentation

## 2024-06-17 DIAGNOSIS — Z7901 Long term (current) use of anticoagulants: Secondary | ICD-10-CM

## 2024-06-17 NOTE — Progress Notes (Signed)
 Patient presents for dressing change and INR in VAD Clinic today. Reports no problems with VAD equipment or concerns with drive line.   Exit Site Care: Existing VAD dressing removed and site care performed using sterile technique. Drive line exit site cleaned with Chlora prep applicators x 2, allowed to dry, and Sorbaview dressing with Silverlon patch applied. Exit site healed and incorporated, the velour is fully implanted at exit site. No redness, tenderness, drainage, foul odor or rash noted. Drive line anchor re-applied. Pt denies fever or chills. Pt has adequate dressing supplies for home use.    Plan: Return to VAD Clinic in 1 week for dressing change w/ BMET/LDH/INR  Schuyler Lunger RN, BSN VAD Coordinator 24/7 Pager [phone]

## 2024-06-20 ENCOUNTER — Other Ambulatory Visit (HOSPITAL_COMMUNITY): Payer: Self-pay | Admitting: Internal Medicine

## 2024-06-23 ENCOUNTER — Other Ambulatory Visit (HOSPITAL_COMMUNITY): Payer: Self-pay

## 2024-06-23 MED ORDER — PANTOPRAZOLE SODIUM 40 MG PO TBEC
40.0000 mg | DELAYED_RELEASE_TABLET | Freq: Every day | ORAL | 6 refills | Status: DC
  Filled 2024-06-23: qty 30, 30d supply, fill #0

## 2024-06-24 ENCOUNTER — Ambulatory Visit (HOSPITAL_COMMUNITY)
Admission: RE | Admit: 2024-06-24 | Discharge: 2024-06-24 | Disposition: A | Discharge status: Home / Self Care | Source: Ambulatory Visit | Attending: Cardiology | Admitting: Cardiology

## 2024-06-24 ENCOUNTER — Other Ambulatory Visit: Payer: Self-pay

## 2024-06-24 ENCOUNTER — Other Ambulatory Visit (HOSPITAL_COMMUNITY): Payer: Self-pay

## 2024-06-24 DIAGNOSIS — Z7901 Long term (current) use of anticoagulants: Secondary | ICD-10-CM | POA: Diagnosis not present

## 2024-06-24 DIAGNOSIS — Z95811 Presence of heart assist device: Secondary | ICD-10-CM | POA: Diagnosis not present

## 2024-06-24 LAB — BASIC METABOLIC PANEL WITH GFR
Anion gap: 10 (ref 5–15)
BUN: 14 mg/dL (ref 8–23)
CO2: 24 mmol/L (ref 22–32)
Calcium: 9.2 mg/dL (ref 8.9–10.3)
Chloride: 103 mmol/L (ref 98–111)
Creatinine, Ser: 1.03 mg/dL (ref 0.61–1.24)
GFR, Estimated: >60 mL/min
Glucose, Bld: 92 mg/dL (ref 70–99)
Potassium: 4.2 mmol/L (ref 3.5–5.1)
Sodium: 137 mmol/L (ref 135–145)

## 2024-06-24 LAB — LACTATE DEHYDROGENASE: LDH: 236 U/L — ABNORMAL HIGH (ref 105–235)

## 2024-06-24 MED ORDER — PANTOPRAZOLE SODIUM 40 MG PO TBEC
40.0000 mg | DELAYED_RELEASE_TABLET | Freq: Every day | ORAL | 3 refills | Status: AC
  Filled 2024-06-24: qty 90, 90d supply, fill #0

## 2024-06-24 NOTE — Progress Notes (Signed)
 Patient presents for dressing change and BMET/LDH/INR in [facility] today. Reports no problems with VAD equipment or concerns with drive line.   Exit Site Care: Existing VAD dressing removed and site care performed using sterile technique. Drive line exit site cleaned with Chlora prep applicators x 2, allowed to dry, and Sorbaview dressing with Silverlon patch applied. Exit site healed and incorporated, the velour is fully implanted at exit site. No redness, tenderness, drainage, foul odor or rash noted. Drive line anchor re-applied. Pt denies fever or chills. Pt has adequate dressing supplies for home use.    Plan: Return to [facility] in 1 week for dressing change   Schuyler Lunger RN, BSN V[facility] Coordinator 24/7 Pager [phone]

## 2024-06-25 ENCOUNTER — Other Ambulatory Visit: Payer: Self-pay

## 2024-06-27 ENCOUNTER — Other Ambulatory Visit (HOSPITAL_COMMUNITY): Payer: Self-pay

## 2024-06-27 DIAGNOSIS — Z7901 Long term (current) use of anticoagulants: Secondary | ICD-10-CM

## 2024-06-27 DIAGNOSIS — Z95811 Presence of heart assist device: Secondary | ICD-10-CM

## 2024-06-29 ENCOUNTER — Other Ambulatory Visit (HOSPITAL_COMMUNITY): Payer: Self-pay

## 2024-07-01 ENCOUNTER — Ambulatory Visit (HOSPITAL_COMMUNITY)
Admission: RE | Admit: 2024-07-01 | Discharge: 2024-07-01 | Disposition: A | Discharge status: Home / Self Care | Source: Ambulatory Visit | Attending: Internal Medicine | Admitting: Internal Medicine

## 2024-07-01 DIAGNOSIS — Z95811 Presence of heart assist device: Secondary | ICD-10-CM | POA: Diagnosis not present

## 2024-07-01 DIAGNOSIS — Z4509 Encounter for adjustment and management of other cardiac device: Secondary | ICD-10-CM | POA: Diagnosis present

## 2024-07-01 NOTE — Progress Notes (Signed)
 Patient presents for dressing change in [facility] today. Reports no problems with V[facility] equipment or concerns with drive line.   Exit Site Care: Existing VAD dressing removed and site care performed using sterile technique. Drive line exit site cleaned with Chlora prep applicators x 2, allowed to dry, and Sorbaview dressing with Silverlon patch applied. Exit site healed and incorporated, the velour is fully implanted at exit site. No redness, tenderness, drainage, foul odor or rash noted. Drive line anchor re-applied. Pt denies fever or chills. Pt has adequate dressing supplies for home use.    Plan: Return to [facility] in 1 week for dressing change w/ INR.  Geofm Christmas RN, BSN [facility] Coordinator 24/7 Pager [phone]

## 2024-07-03 ENCOUNTER — Other Ambulatory Visit (HOSPITAL_COMMUNITY): Payer: Self-pay | Admitting: *Deleted

## 2024-07-03 DIAGNOSIS — Z7901 Long term (current) use of anticoagulants: Secondary | ICD-10-CM

## 2024-07-03 DIAGNOSIS — Z95811 Presence of heart assist device: Secondary | ICD-10-CM

## 2024-07-08 ENCOUNTER — Other Ambulatory Visit: Payer: Self-pay

## 2024-07-08 ENCOUNTER — Other Ambulatory Visit (HOSPITAL_COMMUNITY): Payer: Self-pay

## 2024-07-08 ENCOUNTER — Ambulatory Visit (HOSPITAL_COMMUNITY)
Admission: RE | Admit: 2024-07-08 | Discharge: 2024-07-08 | Disposition: A | Source: Ambulatory Visit | Attending: Cardiology

## 2024-07-08 ENCOUNTER — Ambulatory Visit (HOSPITAL_COMMUNITY): Payer: Self-pay | Admitting: Pharmacist

## 2024-07-08 DIAGNOSIS — Z7901 Long term (current) use of anticoagulants: Secondary | ICD-10-CM | POA: Insufficient documentation

## 2024-07-08 DIAGNOSIS — Z4509 Encounter for adjustment and management of other cardiac device: Secondary | ICD-10-CM | POA: Insufficient documentation

## 2024-07-08 DIAGNOSIS — Z95811 Presence of heart assist device: Secondary | ICD-10-CM | POA: Diagnosis not present

## 2024-07-08 LAB — PROTIME-INR
INR: 2.7 — ABNORMAL HIGH (ref 0.8–1.2)
Prothrombin Time: 30.1 s — ABNORMAL HIGH (ref 11.4–15.2)

## 2024-07-08 MED ORDER — LOSARTAN POTASSIUM 25 MG PO TABS
12.5000 mg | ORAL_TABLET | Freq: Every day | ORAL | 3 refills | Status: AC
  Filled 2024-07-08: qty 45, 90d supply, fill #0

## 2024-07-08 NOTE — Progress Notes (Signed)
 Patient presents for dressing change/INR in [facility] today. Reports no problems with VAD equipment or concerns with drive line.   Exit Site Care: Existing VAD dressing removed and site care performed using sterile technique. Drive line exit site cleaned with Chlora prep applicators x 2, allowed to dry, and Sorbaview dressing with Silverlon patch applied. Exit site healed and incorporated, the velour is fully implanted at exit site. No redness, tenderness, drainage, foul odor or rash noted. Drive line anchor re-applied. Pt denies fever or chills. Pt has adequate dressing supplies for home use.    Plan: Return to [facility] in 1 week for dressing change  Schuyler Lunger RN, BSN [facility] Coordinator 24/7 Pager [phone]

## 2024-07-08 NOTE — Addendum Note (Signed)
 Encounter addended by: Dante Jeannine HERO, CMA on: 07/08/2024 10:43 AM  Actions taken: Child order released for a procedure order, Charge Capture section accepted

## 2024-07-15 ENCOUNTER — Ambulatory Visit (HOSPITAL_COMMUNITY)
Admission: RE | Admit: 2024-07-15 | Discharge: 2024-07-15 | Disposition: A | Discharge status: Home / Self Care | Source: Ambulatory Visit | Attending: Internal Medicine | Admitting: Internal Medicine

## 2024-07-15 DIAGNOSIS — Z4509 Encounter for adjustment and management of other cardiac device: Secondary | ICD-10-CM | POA: Insufficient documentation

## 2024-07-15 DIAGNOSIS — Z95811 Presence of heart assist device: Secondary | ICD-10-CM | POA: Insufficient documentation

## 2024-07-15 NOTE — Progress Notes (Signed)
 Patient presents for dressing change in [facility] today. Reports no problems with VAD equipment or concerns with drive line.   Exit Site Care: Existing VAD dressing removed and site care performed using sterile technique. Drive line exit site cleaned with Chlora prep applicators x 2, allowed to dry, and Sorbaview dressing with Silverlon patch applied. Exit site healed and incorporated, the velour is fully implanted at exit site. No redness, tenderness, drainage, foul odor or rash noted. Drive line anchor re-applied. Pt denies fever or chills. Pt has adequate dressing supplies for home use.    Plan: Return to [facility] in 1 week for dressing change/INR  Lauraine Ip RN, BSN [facility] Coordinator 24/7 Pager [phone]

## 2024-07-16 ENCOUNTER — Telehealth (HOSPITAL_COMMUNITY): Payer: Self-pay
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
## 2024-07-18 ENCOUNTER — Other Ambulatory Visit (HOSPITAL_COMMUNITY): Payer: Self-pay | Admitting: *Deleted

## 2024-07-18 DIAGNOSIS — Z95811 Presence of heart assist device: Secondary | ICD-10-CM

## 2024-07-18 DIAGNOSIS — Z7901 Long term (current) use of anticoagulants: Secondary | ICD-10-CM

## 2024-07-22 ENCOUNTER — Ambulatory Visit (HOSPITAL_COMMUNITY)

## 2024-07-23 ENCOUNTER — Ambulatory Visit (HOSPITAL_COMMUNITY)

## 2024-07-23 ENCOUNTER — Encounter (HOSPITAL_COMMUNITY): Payer: Self-pay

## 2024-07-25 ENCOUNTER — Other Ambulatory Visit (HOSPITAL_COMMUNITY): Payer: Self-pay | Admitting: Unknown Physician Specialty

## 2024-07-25 ENCOUNTER — Ambulatory Visit (HOSPITAL_COMMUNITY)
Admission: RE | Admit: 2024-07-25 | Discharge: 2024-07-25 | Disposition: A | Source: Ambulatory Visit | Attending: Internal Medicine

## 2024-07-25 ENCOUNTER — Other Ambulatory Visit: Payer: Self-pay

## 2024-07-25 DIAGNOSIS — I5022 Chronic systolic (congestive) heart failure: Secondary | ICD-10-CM

## 2024-07-25 DIAGNOSIS — Z7901 Long term (current) use of anticoagulants: Secondary | ICD-10-CM | POA: Diagnosis not present

## 2024-07-25 DIAGNOSIS — Z95811 Presence of heart assist device: Secondary | ICD-10-CM

## 2024-07-25 DIAGNOSIS — Z4509 Encounter for adjustment and management of other cardiac device: Secondary | ICD-10-CM | POA: Insufficient documentation

## 2024-07-25 MED ORDER — WARFARIN SODIUM 2.5 MG PO TABS
ORAL_TABLET | ORAL | 3 refills | Status: AC
  Filled 2024-07-25: qty 180, 75d supply, fill #0

## 2024-07-25 NOTE — Addendum Note (Signed)
 Encounter addended by: Berdine Isaiah NOVAK, RN on: 07/25/2024 12:16 PM  Actions taken: Clinical Note Signed
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
## 2024-07-26 ENCOUNTER — Other Ambulatory Visit (HOSPITAL_COMMUNITY): Payer: Self-pay

## 2024-07-28 ENCOUNTER — Other Ambulatory Visit: Payer: Self-pay

## 2024-07-30 ENCOUNTER — Other Ambulatory Visit: Payer: Self-pay

## 2024-07-30 ENCOUNTER — Ambulatory Visit (HOSPITAL_COMMUNITY): Payer: Self-pay

## 2024-07-30 ENCOUNTER — Ambulatory Visit (HOSPITAL_COMMUNITY): Admission: RE | Admit: 2024-07-30 | Discharge: 2024-07-30 | Attending: Cardiology

## 2024-07-30 DIAGNOSIS — Z7901 Long term (current) use of anticoagulants: Secondary | ICD-10-CM | POA: Insufficient documentation

## 2024-07-30 DIAGNOSIS — Z95811 Presence of heart assist device: Secondary | ICD-10-CM | POA: Diagnosis not present

## 2024-07-30 DIAGNOSIS — Z4509 Encounter for adjustment and management of other cardiac device: Secondary | ICD-10-CM | POA: Insufficient documentation

## 2024-07-30 LAB — PROTIME-INR
INR: 3.8 — ABNORMAL HIGH (ref 0.8–1.2)
Prothrombin Time: 39.2 s — ABNORMAL HIGH (ref 11.4–15.2)

## 2024-07-30 NOTE — Addendum Note (Signed)
 Encounter addended by: Gladis Schuyler BROCKS, RN on: 07/30/2024 10:28 AM  Actions taken: Clinical Note Signed

## 2024-07-30 NOTE — Progress Notes (Addendum)
 Patient presents for dressing change and INR in [facility] today. Reports no problems with [facility] equipment or concerns with drive line.   Exit Site Care: Existing [facility] dressing removed and site care performed using sterile technique. Drive line exit site cleaned with Chlora prep applicators x 2, allowed to dry, and Sorbaview dressing with Silverlon patch applied. Exit site healed and incorporated, the velour is fully implanted at exit site. No redness, tenderness, drainage, foul odor or rash noted. Drive line anchor re-applied. Pt denies fever or chills. Pt has adequate dressing supplies for home use. Pt given 4 weekly kits and 1 box of tegaderms and asked to bring to future appointments for exit site care.    Plan: Return to [facility] in 1 week for dressing change  Schuyler Lunger RN, BSN [facility] Coordinator 24/7 Pager [phone]

## 2024-07-31 ENCOUNTER — Other Ambulatory Visit (HOSPITAL_COMMUNITY): Payer: Self-pay | Admitting: *Deleted

## 2024-07-31 DIAGNOSIS — Z95811 Presence of heart assist device: Secondary | ICD-10-CM

## 2024-07-31 DIAGNOSIS — Z7901 Long term (current) use of anticoagulants: Secondary | ICD-10-CM

## 2024-08-05 ENCOUNTER — Ambulatory Visit (HOSPITAL_COMMUNITY)

## 2024-08-12 ENCOUNTER — Ambulatory Visit (HOSPITAL_COMMUNITY)

## 2024-08-19 ENCOUNTER — Ambulatory Visit: Payer: Medicaid Other

## 2024-11-18 ENCOUNTER — Ambulatory Visit: Payer: Medicaid Other
